# Patient Record
Sex: Male | Born: 1950 | Race: White | Hispanic: No | Marital: Single | Smoking: Former smoker
Health system: Southern US, Community
[De-identification: ages and names within clinical notes are randomized; demographics above are authoritative.]

## PROBLEM LIST (undated history)

## (undated) DIAGNOSIS — I4891 Unspecified atrial fibrillation: Secondary | ICD-10-CM

## (undated) DIAGNOSIS — E785 Hyperlipidemia, unspecified: Secondary | ICD-10-CM

## (undated) DIAGNOSIS — C189 Malignant neoplasm of colon, unspecified: Secondary | ICD-10-CM

## (undated) DIAGNOSIS — Q2543 Congenital aneurysm of aorta: Secondary | ICD-10-CM

## (undated) DIAGNOSIS — E119 Type 2 diabetes mellitus without complications: Secondary | ICD-10-CM

## (undated) DIAGNOSIS — I10 Essential (primary) hypertension: Secondary | ICD-10-CM

## (undated) DIAGNOSIS — I890 Lymphedema, not elsewhere classified: Secondary | ICD-10-CM

## (undated) DIAGNOSIS — L439 Lichen planus, unspecified: Secondary | ICD-10-CM

## (undated) DIAGNOSIS — H269 Unspecified cataract: Secondary | ICD-10-CM

## (undated) HISTORY — DX: Lichen planus, unspecified: L43.9

## (undated) HISTORY — DX: Essential (primary) hypertension: I10

## (undated) HISTORY — PX: FRACTURE SURGERY: SHX138

## (undated) HISTORY — PX: MULTIPLE TOOTH EXTRACTIONS: SHX2053

## (undated) HISTORY — DX: Unspecified cataract: H26.9

## (undated) HISTORY — DX: Type 2 diabetes mellitus without complications: E11.9

## (undated) HISTORY — PX: APPENDECTOMY: SHX54

## (undated) HISTORY — DX: Unspecified atrial fibrillation: I48.91

## (undated) HISTORY — DX: Hyperlipidemia, unspecified: E78.5

---

## 2013-02-23 ENCOUNTER — Ambulatory Visit: Payer: Self-pay | Admitting: Family Medicine

## 2013-02-23 ENCOUNTER — Ambulatory Visit: Payer: 59 | Admitting: Family Medicine

## 2013-02-23 VITALS — BP 138/78 | HR 84 | Temp 98.8°F | Resp 30 | Ht 72.5 in | Wt 320.0 lb

## 2013-02-23 DIAGNOSIS — E785 Hyperlipidemia, unspecified: Secondary | ICD-10-CM | POA: Insufficient documentation

## 2013-02-23 DIAGNOSIS — I480 Paroxysmal atrial fibrillation: Secondary | ICD-10-CM | POA: Insufficient documentation

## 2013-02-23 DIAGNOSIS — I10 Essential (primary) hypertension: Secondary | ICD-10-CM | POA: Insufficient documentation

## 2013-02-23 DIAGNOSIS — I4891 Unspecified atrial fibrillation: Secondary | ICD-10-CM

## 2013-02-23 DIAGNOSIS — E119 Type 2 diabetes mellitus without complications: Secondary | ICD-10-CM

## 2013-02-23 LAB — COMPREHENSIVE METABOLIC PANEL
ALT: 16 U/L (ref 0–53)
CO2: 28 mEq/L (ref 19–32)
Calcium: 9.1 mg/dL (ref 8.4–10.5)
Chloride: 99 mEq/L (ref 96–112)
Sodium: 136 mEq/L (ref 135–145)
Total Protein: 7 g/dL (ref 6.0–8.3)

## 2013-02-23 LAB — POCT CBC
Granulocyte percent: 72.3 %G (ref 37–80)
HCT, POC: 45.1 % (ref 43.5–53.7)
Hemoglobin: 14.2 g/dL (ref 14.1–18.1)
Lymph, poc: 2.1 (ref 0.6–3.4)
MCHC: 31.5 g/dL — AB (ref 31.8–35.4)
POC Granulocyte: 7.2 — AB (ref 2–6.9)

## 2013-02-23 LAB — LIPID PANEL: Cholesterol: 118 mg/dL (ref 0–200)

## 2013-02-23 LAB — POCT GLYCOSYLATED HEMOGLOBIN (HGB A1C): Hemoglobin A1C: 6.6

## 2013-02-23 MED ORDER — METFORMIN HCL 500 MG PO TABS: 1000.0000 mg | ORAL_TABLET | Freq: Two times a day (BID) | ORAL | Status: DC

## 2013-02-23 MED ORDER — METOPROLOL TARTRATE 50 MG PO TABS: 75.0000 mg | ORAL_TABLET | Freq: Two times a day (BID) | ORAL | Status: DC

## 2013-02-23 MED ORDER — LISINOPRIL 5 MG PO TABS: 5.0000 mg | ORAL_TABLET | Freq: Every day | ORAL | Status: DC

## 2013-02-23 MED ORDER — APIXABAN 5 MG PO TABS: 5.0000 mg | ORAL_TABLET | Freq: Two times a day (BID) | ORAL | Status: DC

## 2013-02-23 MED ORDER — GLIPIZIDE ER 5 MG PO TB24: 5.0000 mg | ORAL_TABLET | Freq: Every day | ORAL | Status: DC

## 2013-02-23 MED ORDER — ATORVASTATIN CALCIUM 20 MG PO TABS: 20.0000 mg | ORAL_TABLET | Freq: Every day | ORAL | Status: DC

## 2013-02-23 NOTE — Progress Notes (Signed)
[facility] [address], [place] [address] [phone]  Date:  02/23/2013   Name:  [name]   DOB:  04-16-1951   MRN:  [id number]  PCP:  No primary provider on file.    Chief Complaint: Medication Refill   History of Present Illness:  Tri Chittick is a 62 y.o. very pleasant male patient who presents with the following:  He is here as a new patient today.  He has an appt with Dr. Clelia Croft next month but needs his medications refilled prior to his appt.  He has returned to [place] after living in [place] for some time.  He has a long history of atrial fibrillation- he takes Eliquis for anticoagulation and metoprolol for rate control. Actually has been out of his Eliquis for a month or so.  He has seen a cardiologist in [place], but does not have a cardiologist yet in [facility] in [place] was Dr. Dayle Points- he is not sure of the name of the office.  He does have his medical records but did not bring them today.    He also has a history of DM on oral medications.   He is fasting today and would like to do labs.   He is also concerned about his feet and would like to see a podiatrist for toenail care Aware of his obesity and is trying to lose weight  There is no problem list on file for this patient.   Past Medical History  Diagnosis Date  . Atrial fibrillation   . Diabetes mellitus without complication   . Cataract   . Hyperlipidemia   . Hypertension     Past Surgical History  Procedure Laterality Date  . Appendectomy    . Multiple tooth extractions    . Fracture surgery      History  Substance Use Topics  . Smoking status: Never Smoker   . Smokeless tobacco: Not on file  . Alcohol Use: No    Family History  Problem Relation Age of Onset  . Brain cancer Mother   . Alzheimer's disease Father   . Cancer Maternal Grandmother   . Heart attack Maternal Grandfather   . Pneumonia Paternal Grandfather     No  Known Allergies  Medication list has been reviewed and updated.  No current outpatient prescriptions on file prior to visit.   No current facility-administered medications on file prior to visit.    Review of Systems:  As per HPI- otherwise negative.   Physical Examination: Filed Vitals:   02/23/13 1051  BP: 138/78  Pulse: 84  Temp: 98.8 F (37.1 C)  Resp: 30   Filed Vitals:   02/23/13 1051  Height: 6' 0.5" (1.842 m)  Weight: 320 lb (145.151 kg)   Body mass index is 42.78 kg/(m^2). Ideal Body Weight: Weight in (lb) to have BMI = 25: 186.5  GEN: WDWN, NAD, Non-toxic, A & O x 3, morbid obesity HEENT: Atraumatic, Normocephalic. Neck supple. No masses, No LAD. Ears and Nose: No external deformity. CV: RRR, No M/G/R. No JVD. No thrill. No extra heart sounds. PULM: CTA B, no wheezes, crackles, rhonchi. No retractions. No resp. distress. No accessory muscle use. He was not tachypnic during my exam ABD: S, NT, ND EXTR: No c/c/e NEURO Normal gait.  PSYCH: Normally interactive. Conversant. Not depressed or anxious appearing.  Calm demeanor.  Feet: overgrown thick nails but no skin breakdown  Results for orders placed in visit on  02/23/13  POCT CBC      Result Value Range   WBC 9.9  4.6 - 10.2 K/uL   Lymph, poc 2.1  0.6 - 3.4   POC LYMPH PERCENT 21.3  10 - 50 %L   MID (cbc) 0.6  0 - 0.9   POC MID % 6.4  0 - 12 %M   POC Granulocyte 7.2 (*) 2 - 6.9   Granulocyte percent 72.3  37 - 80 %G   RBC 4.49 (*) 4.69 - 6.13 M/uL   Hemoglobin 14.2  14.1 - 18.1 g/dL   HCT, POC 62.1  30.8 - 53.7 %   MCV 100.5 (*) 80 - 97 fL   MCH, POC 31.6 (*) 27 - 31.2 pg   MCHC 31.5 (*) 31.8 - 35.4 g/dL   RDW, POC 65.7     Platelet Count, POC 367  142 - 424 K/uL   MPV 8.5  0 - 99.8 fL  POCT GLYCOSYLATED HEMOGLOBIN (HGB A1C)      Result Value Range   Hemoglobin A1C 6.6      Assessment and Plan: Type II or unspecified type diabetes mellitus without mention of complication, not stated as  uncontrolled - Plan: POCT CBC, POCT glycosylated hemoglobin (Hb A1C), metFORMIN (GLUCOPHAGE) 500 MG tablet, glipiZIDE (GLUCOTROL XL) 5 MG 24 hr tablet, CANCELED: Ambulatory referral to Podiatry  Essential hypertension, benign - Plan: Comprehensive metabolic panel, Lipid panel, lisinopril (PRINIVIL,ZESTRIL) 5 MG tablet  Atrial fibrillation - Plan: metoprolol (LOPRESSOR) 50 MG tablet, lisinopril (PRINIVIL,ZESTRIL) 5 MG tablet, apixaban (ELIQUIS) 5 MG TABS tablet  Other and unspecified hyperlipidemia - Plan: Lipid panel, atorvastatin (LIPITOR) 20 MG tablet  Morbid obesity  DM is controlled, refilled his medications today May restart his eliquis today, will check CMP.  Rate is controlled Refilled all medications today Gave contact information for [facility]- he prefers to make his own appt  Will plan further follow- up pending labs.   Abbe Amsterdam, MD

## 2013-02-23 NOTE — Patient Instructions (Addendum)
I will be in touch with the rest of of your labs in a few days. We will see you for your appointment in April.  Let us know know if any problems in the meantime   Your A1c looks ok- this means your diabetes is under control  We are going to set you up with a foot doctor

## 2013-02-25 ENCOUNTER — Encounter: Payer: Self-pay | Admitting: Family Medicine

## 2013-03-16 ENCOUNTER — Encounter: Payer: Self-pay | Admitting: Family Medicine

## 2013-03-16 ENCOUNTER — Ambulatory Visit: Payer: 59 | Admitting: Family Medicine

## 2013-03-16 VITALS — BP 116/74 | HR 86 | Temp 97.6°F | Resp 18 | Ht 72.5 in | Wt 336.8 lb

## 2013-03-16 DIAGNOSIS — I10 Essential (primary) hypertension: Secondary | ICD-10-CM

## 2013-03-16 DIAGNOSIS — M1990 Unspecified osteoarthritis, unspecified site: Secondary | ICD-10-CM | POA: Insufficient documentation

## 2013-03-16 DIAGNOSIS — E669 Obesity, unspecified: Secondary | ICD-10-CM | POA: Insufficient documentation

## 2013-03-16 DIAGNOSIS — R0989 Other specified symptoms and signs involving the circulatory and respiratory systems: Secondary | ICD-10-CM

## 2013-03-16 DIAGNOSIS — E785 Hyperlipidemia, unspecified: Secondary | ICD-10-CM

## 2013-03-16 DIAGNOSIS — I4891 Unspecified atrial fibrillation: Secondary | ICD-10-CM

## 2013-03-16 DIAGNOSIS — R609 Edema, unspecified: Secondary | ICD-10-CM

## 2013-03-16 DIAGNOSIS — E119 Type 2 diabetes mellitus without complications: Secondary | ICD-10-CM | POA: Insufficient documentation
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
## 2013-03-16 NOTE — Patient Instructions (Addendum)
Diabetes and Exercise Diabetes mellitus is a common, chronic disease, in which the pancreas is unable to adequately control blood glucose (sugar) levels. There are 2 types of diabetes. Type 1 diabetes patients are unable to produce insulin, a hormone that causes sugar in the blood to be stored in the body. People with type 1 diabetes may compensate by giving themselves injections of insulin. Type 2 diabetes involves not producing adequate amounts of insulin to control blood glucose levels. People with type 2 diabetes control their blood glucose by monitoring their food intake or by taking medicine. Exercise is an important part of diabetes treatment. During exercise, the muscles use a greater amount of glucose from the blood for energy. This lowers your blood glucose, which is the same effect you would get from taking insulin. It has been shown that endurance athletes are more sensitive to insulin than inactive people. SYMPTOMS  Many people with a mild case of diabetes have no symptoms. However, if left uncontrolled, diabetes can lead to several complications that could be prevented with treatment of the disease. General symptoms of diabetes include:   Frequent urination (polyuria).  Frequent thirst and drinking (polydipsia).  Increased food consumption (polyphagia).  Fatigue.  Poor exercise performance.  Blurred vision.  Inflammation of the vagina (vaginitis) caused by fungal infections.  Skin infections (uncommon).  Numbness in the feet,caused by nerve injury.  Kidney disease. CAUSES  The cause of most cases of diabetes is unknown. In children, diabetes is often due to an autoimmune response to the cells in the pancreas that make insulin. It is also linked with other diseases, such as cystic fibrosis. Diabetes may have a genetic link. PREVENTION  Athletes should strive to begin exercise with blood glucose in a well-controlled state.  Feet should always be kept clean and  dry.  Activities in which low blood sugar levels cannot be treated easily (scuba diving, rock climbing, swimming) should be avoided.  Anticipate alterations in diet or training to avoid low blood sugar (hypoglycemia) and high blood sugar (hyperglycemia).  Athletes should try to increase sugar consumption after strenuous exercise to avoid hypoglycemia.  Short-acting insulin should not be injected into an actively exercising muscle. The athlete should rest the injection site for about 1 hour after exercise.  Patients with diabetes should get routine checkups of the feet to prevent complications. PROGNOSIS  Exercise provides many benefits to the person with diabetes:   Reduced body fat.  Lower blood pressure.  Often, reduced need for medicines.  Improved exercise tolerance.  Lower insulin levels.  Weight loss.  Improved lipid profile (decreased cholesterol and low-density lipoproteins). RELATED COMPLICATIONS  If performed incorrectly, exercise can result in complications of diabetes:   Poor control of blood sugar, when exercise is performed at the wrong time.  Increase in renal disease, from loss of body fluids (dehydration).  Increased risk of nerve injury (neuropathy) when performing exercises that increase foot injury.  Increased risk of eye problems when performingactivities that involve breath holding or lowering or jarring the head.  Increased risk of sudden death from exercise in patients with heart disease.  Worsening of hypertension with heavy lifting (more than 10 lb/4.5 kg). Altered blood glucose and insulin dose as a result of mild illness that produces loss of appetite.  Altered uptake of insulin after injection when insulin injection site is changed. NOTE: Exercise can lower blood glucose effectively, but the effects are short-lasting (no more than a couple of days). Exercise has been shown to improve your   sensitivity to insulin. This may alter how your body  responds to a given dose of injected insulin. It is important for every patient with diabetes to know how his or her body may react to exercise, and to adjust insulin dosages accordingly. TREATMENT  Eat about 1 to 3 hours before exercise.  Check blood glucose immediately before and after exercise.  Stop exercise if blood glucose is more than 250 mg/dL.  Stop exercise if blood glucose is less than 100 mg/dL.  Do not exercise within 1 hour of an insulin injection.  Be prepared to treat low blood glucose while exercising. Keep some sugar product with you, such as a candy bar.  For prolonged exercise, use a sports drink to maintain your glucose level.  Replace used up glucose in the body after exercise.  Consume fluids during and after exercise to avoid dehydration. SEEK MEDICAL CARE IF:  You have vision changes after a run.  You notice a loss of sensation in your feet after exercise.  You have increased numbness, tingling, or pins and needles sensations after exercise.  You have chest pain during or after exercise.  You have a fast, irregular heartbeat (palpitations) during or after exercise.  Your exercise tolerance gets worse.  You have fainting or dizzy spells for brief periods during or after exercise. Document Released: 11/29/2005 Document Revised: 02/21/2012 Document Reviewed: 03/13/2009 [facility] Patient Information 2013 [place], [place]. Exercise to Lose Weight Exercise and a healthy diet may help you lose weight. Your doctor may suggest specific exercises. EXERCISE IDEAS AND TIPS Choose low-cost things you enjoy doing, such as walking, bicycling, or exercising to workout videos. Take stairs instead of the elevator. Walk during your lunch break. Park your car further away from work or school. Go to a gym or an exercise class. Start with 5 to 10 minutes of exercise each day. Build up to 30 minutes of exercise 4 to 6 days a week. Wear shoes with good support and  comfortable clothes. Stretch before and after working out. Work out until you breathe harder and your heart beats faster. Drink extra water when you exercise. Do not do so much that you hurt yourself, feel dizzy, or get very short of breath. Exercises that burn about 150 calories: Running 1  miles in 15 minutes. Playing volleyball for 45 to 60 minutes. Washing and waxing a car for 45 to 60 minutes. Playing touch football for 45 minutes. Walking 1  miles in 35 minutes. Pushing a stroller 1  miles in 30 minutes. Playing basketball for 30 minutes. Raking leaves for 30 minutes. Bicycling 5 miles in 30 minutes. Walking 2 miles in 30 minutes. Dancing for 30 minutes. Shoveling snow for 15 minutes. Swimming laps for 20 minutes. Walking up stairs for 15 minutes. Bicycling 4 miles in 15 minutes. Gardening for 30 to 45 minutes. Jumping rope for 15 minutes. Washing windows or floors for 45 to 60 minutes. Document Released: 01/01/2011 Document Revised: 02/21/2012 Document Reviewed: 01/01/2011 [facility] Patient Information 2013 [place], [place]. Diabetes and Exercise Regular exercise is important and can help:   Control blood glucose (sugar).  Decrease blood pressure.    Control blood lipids (cholesterol, triglycerides).  Improve overall health. BENEFITS FROM EXERCISE  Improved fitness.  Improved flexibility.  Improved endurance.  Increased bone density.  Weight control.  Increased muscle strength.  Decreased body fat.  Improvement of the body's use of insulin, a hormone.  Increased insulin sensitivity.  Reduction of insulin needs.  Reduced stress and tension.  Helps you feel better. People with diabetes who add exercise to their lifestyle gain additional benefits, including:  Weight loss.  Reduced appetite.  Improvement of the body's use of blood glucose.  Decreased risk factors for heart disease:  Lowering of cholesterol and triglycerides.  Raising the  level of good cholesterol (high-density lipoproteins, HDL).  Lowering blood sugar.  Decreased blood pressure. TYPE 1 DIABETES AND EXERCISE  Exercise will usually lower your blood glucose.  If blood glucose is greater than 240 mg/dl, check urine ketones. If ketones are present, do not exercise.  Location of the insulin injection sites may need to be adjusted with exercise. Avoid injecting insulin into areas of the body that will be exercised. For example, avoid injecting insulin into:  The arms when playing tennis.  The legs when jogging. For more information, discuss this with your caregiver.  Keep a record of:  Food intake.  Type and amount of exercise.  Expected peak times of insulin action.  Blood glucose levels. Do this before, during, and after exercise. Review your records with your caregiver. This will help you to develop guidelines for adjusting food intake and insulin amounts.  TYPE 2 DIABETES AND EXERCISE  Regular physical activity can help control blood glucose.  Exercise is important because it may:  Increase the body's sensitivity to insulin.  Improve blood glucose control.  Exercise reduces the risk of heart disease. It decreases serum cholesterol and triglycerides. It also lowers blood pressure.  Those who take insulin or oral hypoglycemic agents should watch for signs of hypoglycemia. These signs include dizziness, shaking, sweating, chills, and confusion.  Body water is lost during exercise. It must be replaced. This will help to avoid loss of body fluids (dehydration) or heat stroke. Be sure to talk to your caregiver before starting an exercise program to make sure it is safe for you. Remember, any activity is better than none.  Document Released: 02/19/2004 Document Revised: 02/21/2012 Document Reviewed: 06/05/2009 [facility] Patient Information 2013 [place], [place].

## 2013-05-18 ENCOUNTER — Ambulatory Visit: Payer: 59 | Admitting: Family Medicine

## 2013-05-25 ENCOUNTER — Other Ambulatory Visit: Payer: Self-pay | Admitting: Family Medicine

## 2013-05-25 ENCOUNTER — Ambulatory Visit (INDEPENDENT_AMBULATORY_CARE_PROVIDER_SITE_OTHER): Payer: 59 | Admitting: Family Medicine

## 2013-05-25 ENCOUNTER — Encounter: Payer: Self-pay | Admitting: Family Medicine

## 2013-05-25 VITALS — BP 120/68 | HR 97 | Temp 98.7°F | Resp 18 | Ht 73.0 in | Wt 339.2 lb

## 2013-05-25 DIAGNOSIS — R21 Rash and other nonspecific skin eruption: Secondary | ICD-10-CM

## 2013-05-25 DIAGNOSIS — E119 Type 2 diabetes mellitus without complications: Secondary | ICD-10-CM

## 2013-05-25 LAB — COMPREHENSIVE METABOLIC PANEL
Albumin: 3.8 g/dL (ref 3.5–5.2)
CO2: 26 mEq/L (ref 19–32)
Calcium: 8.9 mg/dL (ref 8.4–10.5)
Glucose, Bld: 143 mg/dL — ABNORMAL HIGH (ref 70–99)
Potassium: 4.7 mEq/L (ref 3.5–5.3)
Sodium: 137 mEq/L (ref 135–145)
Total Protein: 6.6 g/dL (ref 6.0–8.3)

## 2013-05-25 LAB — POCT CBC
Granulocyte percent: 85.4 %G — AB (ref 37–80)
HCT, POC: 43 % — AB (ref 43.5–53.7)
MID (cbc): 0.3 (ref 0–0.9)
MPV: 8.3 fL (ref 0–99.8)
POC Granulocyte: 7 — AB (ref 2–6.9)
POC LYMPH PERCENT: 11 %L (ref 10–50)
Platelet Count, POC: 238 10*3/uL (ref 142–424)
RDW, POC: 14.4 %

## 2013-05-25 LAB — POCT URINALYSIS DIPSTICK
Glucose, UA: NEGATIVE
Ketones, UA: NEGATIVE
Protein, UA: 100
Spec Grav, UA: 1.025

## 2013-05-25 LAB — POCT UA - MICROSCOPIC ONLY
Bacteria, U Microscopic: NEGATIVE
Crystals, Ur, HPF, POC: NEGATIVE

## 2013-05-25 LAB — POCT GLYCOSYLATED HEMOGLOBIN (HGB A1C): Hemoglobin A1C: 7.1

## 2013-05-25 LAB — GLUCOSE, POCT (MANUAL RESULT ENTRY): POC Glucose: 169 mg/dl — AB (ref 70–99)

## 2013-05-25 MED ORDER — HYDROXYZINE HCL 25 MG PO TABS: 12.5000 mg | ORAL_TABLET | Freq: Three times a day (TID) | ORAL | Status: DC | PRN

## 2013-05-25 MED ORDER — LOSARTAN POTASSIUM 25 MG PO TABS: 25.0000 mg | ORAL_TABLET | Freq: Every day | ORAL | Status: DC

## 2013-05-25 MED ORDER — VALACYCLOVIR HCL 1 G PO TABS: 1000.0000 mg | ORAL_TABLET | Freq: Three times a day (TID) | ORAL | Status: DC

## 2013-05-25 NOTE — Progress Notes (Signed)
Subjective:    Patient ID: [name], male    DOB: 1951-11-19, 62 y.o.   MRN: [id number] Chief Complaint  Patient presents with  . Rash    rash on both hands and all over body    HPI  Drove to [place] 2 wks ago and when he came back 3d ago he started getting tingling in his hands and then yesterday he developed 7-8 spots across his palms and itchy bumps his hair on top of his head and along the back of neck and his left chest.  Things progressively worsened today. He thinks it is like shingles which he had before in the same location on the back of his neck and his scalp as the rash feels exactly the same way the shingles did.  It is itching, burning, and his palms of his hands and soles of his feet are painful to pressure today.  Is tolerable but it is spreading and seems to be actively worsening.  Has about 50 spots on his palms today.  Also can see new lesions on his chest. Has had chicken pox prior Hasn't tried anything on the rash.  Only really exploded in past 2d, no changes in products or foods, hasn't really left his appt. The only thing different is his [place] trip but he just stayed in hotel and drove around and went out to eat.  Didn't swim at all or go hiking, didn't lay in the sand.  Has also had some loose stools  Thinks the lisinopril is causing a dry tickle in his throat.  Past Medical History  Diagnosis Date  . Atrial fibrillation   . Diabetes mellitus without complication   . Cataract   . Hyperlipidemia   . Hypertension   . Lichen planus     bilateral legs   Current Outpatient Prescriptions on File Prior to Visit  Medication Sig Dispense Refill  . apixaban (ELIQUIS) 5 MG TABS tablet Take 1 tablet (5 mg total) by mouth 2 (two) times daily.  180 tablet  3  . atorvastatin (LIPITOR) 20 MG tablet Take 1 tablet (20 mg total) by mouth daily.  90 tablet  3  . cyanocobalamin 500 MCG tablet Take 500 mcg by mouth daily.      [name] glipiZIDE (GLUCOTROL XL) 5 MG 24 hr tablet Take 1  tablet (5 mg total) by mouth daily.  90 tablet  3  . metFORMIN (GLUCOPHAGE) 500 MG tablet Take 2 tablets (1,000 mg total) by mouth 2 (two) times daily with a meal.  360 tablet  3  . metoprolol (LOPRESSOR) 50 MG tablet Take 1.5 tablets (75 mg total) by mouth 2 (two) times daily.  270 tablet  3   No current facility-administered medications on file prior to visit.   No Known Allergies  Review of Systems  Constitutional: Negative for fever, chills, diaphoresis, activity change, appetite change, fatigue and unexpected weight change.  HENT: Negative for ear pain, congestion, sore throat, neck pain, neck stiffness and sinus pressure.   Respiratory: Positive for cough. Negative for shortness of breath.   Cardiovascular: Negative for chest pain.  Gastrointestinal: Positive for diarrhea. Negative for nausea, vomiting, abdominal pain and constipation.  Genitourinary: Negative for dysuria.  Musculoskeletal: Positive for arthralgias. Negative for myalgias, joint swelling and gait problem.  Skin: Negative for rash.  Neurological: Negative for syncope and headaches.  Psychiatric/Behavioral: Negative for sleep disturbance.      BP 120/68  Pulse 97  Temp(Src) 98.7 F (37.1 C) (Oral)  Resp 18  Ht 6\' 1"  (1.854 m)  Wt 339 lb 3.2 oz (153.86 kg)  BMI 44.76 kg/m2  SpO2 97% Objective:   Physical Exam  Constitutional: He is oriented to person, place, and time. He appears well-developed and well-nourished. No distress.  HENT:  Head: Normocephalic and atraumatic.  Eyes: No scleral icterus.  Pulmonary/Chest: Effort normal.  Neurological: He is alert and oriented to person, place, and time.  Skin: Skin is warm and dry. Rash noted. Rash is vesicular. He is not diaphoretic.  Numerous macular dark erythremic target lesions with few bullae over bilateral palms.  Over left chest and over entire posterior scalp are numerous isolated 3-35mm vesicles but hard roofed with dark yellow crust, none w/ fluid still  present visible.  Psychiatric: He has a normal mood and affect. His behavior is normal.      Results for orders placed in visit on 05/25/13  COMPREHENSIVE METABOLIC PANEL      Result Value Range   Sodium 137  135 - 145 mEq/L   Potassium 4.7  3.5 - 5.3 mEq/L   Chloride 99  96 - 112 mEq/L   CO2 26  19 - 32 mEq/L   Glucose, Bld 143 (*) 70 - 99 mg/dL   BUN 15  6 - 23 mg/dL   Creat 1.09  0.50 - 1.35 mg/dL   Total Bilirubin 1.1  0.3 - 1.2 mg/dL   Alkaline Phosphatase 87  39 - 117 U/L   AST 17  0 - 37 U/L   ALT 15  0 - 53 U/L   Total Protein 6.6  6.0 - 8.3 g/dL   Albumin 3.8  3.5 - 5.2 g/dL   Calcium 8.9  8.4 - 10.5 mg/dL  RPR      Result Value Range   RPR NON REAC  NON REAC  HSV(HERPES SIMPLEX VRS) I + II AB-IGM      Result Value Range   Herpes Simplex Vrs I&II-IgM Ab (EIA)      VARICELLA ZOSTER ANTIBODY, IGG      Result Value Range   Varicella IgG 3794.00 (*) <135.00 Index  POCT CBC      Result Value Range   WBC 8.2  4.6 - 10.2 K/uL   Lymph, poc 0.9  0.6 - 3.4   POC LYMPH PERCENT 11.0  10 - 50 %L   MID (cbc) 0.3  0 - 0.9   POC MID % 3.6  0 - 12 %M   POC Granulocyte 7.0 (*) 2 - 6.9   Granulocyte percent 85.4 (*) 37 - 80 %G   RBC 4.15 (*) 4.69 - 6.13 M/uL   Hemoglobin 13.5 (*) 14.1 - 18.1 g/dL   HCT, POC 43.0 (*) 43.5 - 53.7 %   MCV 103.6 (*) 80 - 97 fL   MCH, POC 32.5 (*) 27 - 31.2 pg   MCHC 31.4 (*) 31.8 - 35.4 g/dL   RDW, POC 14.4     Platelet Count, POC 238  142 - 424 K/uL   MPV 8.3  0 - 99.8 fL  GLUCOSE, POCT (MANUAL RESULT ENTRY)      Result Value Range   POC Glucose 169 (*) 70 - 99 mg/dl  POCT GLYCOSYLATED HEMOGLOBIN (HGB A1C)      Result Value Range   Hemoglobin A1C 7.1    POCT SEDIMENTATION RATE      Result Value Range   POCT SED RATE 39 (*) 0 - 22 mm/hr  POCT UA - MICROSCOPIC  ONLY      Result Value Range   WBC, Ur, HPF, POC 0-8     RBC, urine, microscopic 0-3     Bacteria, U Microscopic neg     Mucus, UA moderate     Epithelial cells, urine per micros  0-5     Crystals, Ur, HPF, POC neg     Casts, Ur, LPF, POC neg     Yeast, UA neg    POCT URINALYSIS DIPSTICK      Result Value Range   Color, UA yellow     Clarity, UA clear     Glucose, UA neg     Bilirubin, UA neg     Ketones, UA neg     Spec Grav, UA 1.025     Blood, UA neg     pH, UA 7.0     Protein, UA 100     Urobilinogen, UA 1.0     Nitrite, UA neg     Leukocytes, UA Negative      Assessment & Plan:  Diabetes - Plan: POCT glucose (manual entry), POCT glycosylated hemoglobin (Hb A1C) - will change from low dose lisinopril to losartan due to chronic dry cough.  Rash and nonspecific skin eruption - Plan: POCT CBC, POCT SEDIMENTATION RATE, POCT UA - Microscopic Only, POCT urinalysis dipstick, Comprehensive metabolic panel, RPR, Herpes simplex virus culture, HSV(herpes simplex vrs) 1+2 ab-IgM, Varicella zoster antibody, IgG - Pt is having an erythema multiforme reaction on his hands - likely to whatever rash he has over his chest and scalp, beard. Also developed erythema multiforme lesions on ankles while in clinic.  HSV culture pending and separate viral culture also sent but latter will take a mo to return. In meantime, will start trx w/ antiviral and have pt f/u closely w/in 48 hrs. If worsening, refer to derm and cons biopsy. Symptomatic treatment w/ cold compresses, aloe, vaseline, - do not use any cortisone.  Meds ordered this encounter  Medications  . valACYclovir (VALTREX) 1000 MG tablet    Sig: Take 1 tablet (1,000 mg total) by mouth 3 (three) times daily.    Dispense:  21 tablet    Refill:  0  . hydrOXYzine (ATARAX/VISTARIL) 25 MG tablet    Sig: Take 0.5-1 tablets (12.5-25 mg total) by mouth every 8 (eight) hours as needed for itching.    Dispense:  30 tablet    Refill:  0  . losartan (COZAAR) 25 MG tablet    Sig: Take 1 tablet (25 mg total) by mouth daily.    Dispense:  90 tablet    Refill:  1

## 2013-05-25 NOTE — Patient Instructions (Addendum)
Erythema Multiforme Erythema multiforme (EM) is a rash that occurs mostly on the skin. Sometimes it occurs on the lips and mouth. It is usually a mild illness that goes away on its own. It usually affects young adults in the spring and fall. It tends to be recurrent with each episode lasting 1 to 4 weeks. CAUSES  The cause of EM may be an overreaction by the body's immune system to a trigger (something that causes the body to react).  Common triggers include:  Infections, including:  Viruses.  Bacteria.  Fungi.  Parasites.  Medicines. Less common triggers include:  Foods.  Chemicals.  Injuries to the skin.  Pregnancy.  Other illnesses. In some cases the cause may not be known. SYMPTOMS  The rash from EM shows up suddenly. The rash may appear days after the trigger. It may start as small, red, round or oval marks that become bumps or raised welts over 24 to 48 hours. These can spread and be quite large (about one inch [several centimeters]). These skin changes usually appear first on the backs of the hands, then spread to the tops of the feet, arms, elbows, knees, palms and soles. There may be a mild rash on the lips and lining of the mouth. The skin rash may show up in waves over a few days. There may be mild itching or burning of the skin at first. It may take up to 4 weeks to go away. The rash may come back again at a later time. DIAGNOSIS  Diagnosis of EM is usually made by physical exam. Sometimes a skin biopsy is done if the diagnosis is not certain. A skin biopsy is the removal of a small piece of tissue which can be examined under a microscope by a specialist (pathologist). TREATMENT  Most episodes of EM heal on their own and treatment may not be needed. If possible, it is best to remove the trigger or treat the infection. If your trigger is a herpes virus infection (cold sore), use sunscreen lotion and sunscreen-containing lip balm to prevent sunlight triggered outbreaks of  herpes virus. Medicine for itching may be given. Medicines can be used for severe cases and to prevent repeat bouts of EM.  HOME CARE INSTRUCTIONS   If possible, avoid known triggers.  If a medicine was your trigger, be sure to notify all of your caregivers. You should avoid this medicine or any like it in the future. SEEK MEDICAL CARE IF:   Your EM rash shows up again in the future SEEK IMMEDIATE MEDICAL CARE IF:   Red, swollen lips or mouth develop.  Burning feeling in the mouth or lips.  Blisters or open sores in the mouth, lips, vagina, penis or anus.  Eye pain, redness or drainage.  Blisters on the skin.  Difficulty breathing.  Difficulty swallowing; drooling.  Blood in urine.  Pain with urinating. Document Released: 11/29/2005 Document Revised: 02/21/2012 Document Reviewed: 11/15/2008 Exit[employer] Patient Information 2014 [employer].  

## 2013-05-26 LAB — RPR

## 2013-05-27 ENCOUNTER — Ambulatory Visit (INDEPENDENT_AMBULATORY_CARE_PROVIDER_SITE_OTHER): Payer: 59 | Admitting: Internal Medicine

## 2013-05-27 VITALS — BP 132/71 | HR 123 | Temp 99.8°F | Resp 24 | Ht 73.0 in | Wt 341.0 lb

## 2013-05-27 DIAGNOSIS — N509 Disorder of male genital organs, unspecified: Secondary | ICD-10-CM

## 2013-05-27 DIAGNOSIS — N50819 Testicular pain, unspecified: Secondary | ICD-10-CM

## 2013-05-27 DIAGNOSIS — R21 Rash and other nonspecific skin eruption: Secondary | ICD-10-CM

## 2013-05-27 LAB — POCT URINALYSIS DIPSTICK
Blood, UA: NEGATIVE
Glucose, UA: 100
Nitrite, UA: NEGATIVE
Protein, UA: 30
Urobilinogen, UA: 0.2
pH, UA: 6

## 2013-05-27 LAB — POCT UA - MICROSCOPIC ONLY
Casts, Ur, LPF, POC: NEGATIVE
Crystals, Ur, HPF, POC: NEGATIVE
Mucus, UA: NEGATIVE
Yeast, UA: NEGATIVE

## 2013-05-27 NOTE — Progress Notes (Signed)
  Subjective:    Patient ID: [name], male    DOB: 06-08-1951, 62 y.o.   MRN: [id number]  HPIsee OV 2 days ago Dr Clelia Croft Rash on hands and feet much less painful Rash on legs extending up toward knees new lesions scalp and beard ??  Also sat on small toilet and crushed L testicle which is now painful w/ any movement or pressure No dysuria or frequency but feels a sense of urgency and suprapubic pressure HSV culture pending  Review of Systems Still no fever chills or night sweats    Objective:   Physical Exam Temperature 99.8 No acute distress Rash on hands and feet appears to be evolving into a deeper purple with less target lesions and more mature fasciculation though no scabs/still has frank bullae that are resolving as well Rash on legs appears to be spreading as found erythematous tiny papules but no vesiculation or scabbing Lesions on chest wall or clearing in scabbing   lesions on scalp and beard remain indeterminate  Left testicle is mildly swollen with tenderness in the body of the testicle as well as the lower pole epididymis Results for orders placed in visit on 05/27/13  POCT UA - MICROSCOPIC ONLY      Result Value Range   WBC, Ur, HPF, POC 0-1     RBC, urine, microscopic 0-1     Bacteria, U Microscopic TRACE     Mucus, UA NEG     Epithelial cells, urine per micros NEG     Crystals, Ur, HPF, POC NEG     Casts, Ur, LPF, POC NEG     Yeast, UA NEG    POCT URINALYSIS DIPSTICK      Result Value Range   Color, UA dk yellow     Clarity, UA clear     Glucose, UA 100     Bilirubin, UA NEG     Ketones, UA NEG     Spec Grav, UA 1.025     Blood, UA NEG     pH, UA 6.0     Protein, UA 30     Urobilinogen, UA 0.2     Nitrite, UA NEG     Leukocytes, UA Negative      Assessment & Plan:  Problem #1 this rash is improving symptomatically and maturing somewhat the appearance while patient is on Valtrex with HSV culture pending  We'll try to set up dermatology  evaluation as soon as possible to consider other etiologies  Problem #2 crush injury to testicle Ice, elevate, protect, tylenol

## 2013-05-28 LAB — HERPES SIMPLEX VIRUS CULTURE: Organism ID, Bacteria: NOT DETECTED

## 2013-06-08 ENCOUNTER — Ambulatory Visit: Payer: 59 | Admitting: Family Medicine

## 2013-08-14 ENCOUNTER — Other Ambulatory Visit: Payer: Self-pay | Admitting: Family Medicine

## 2013-08-15 ENCOUNTER — Ambulatory Visit: Payer: 59 | Admitting: Family Medicine

## 2013-08-15 VITALS — BP 118/70 | HR 78 | Temp 98.5°F | Resp 18 | Ht 73.0 in | Wt 352.0 lb

## 2013-08-15 DIAGNOSIS — R609 Edema, unspecified: Secondary | ICD-10-CM

## 2013-08-15 DIAGNOSIS — Z79899 Other long term (current) drug therapy: Secondary | ICD-10-CM

## 2013-08-15 DIAGNOSIS — M1990 Unspecified osteoarthritis, unspecified site: Secondary | ICD-10-CM

## 2013-08-15 DIAGNOSIS — E119 Type 2 diabetes mellitus without complications: Secondary | ICD-10-CM

## 2013-08-15 DIAGNOSIS — E1169 Type 2 diabetes mellitus with other specified complication: Secondary | ICD-10-CM

## 2013-08-15 DIAGNOSIS — L0390 Cellulitis, unspecified: Secondary | ICD-10-CM

## 2013-08-15 DIAGNOSIS — I4891 Unspecified atrial fibrillation: Secondary | ICD-10-CM

## 2013-08-15 DIAGNOSIS — L439 Lichen planus, unspecified: Secondary | ICD-10-CM

## 2013-08-15 DIAGNOSIS — M129 Arthropathy, unspecified: Secondary | ICD-10-CM

## 2013-08-15 DIAGNOSIS — L0291 Cutaneous abscess, unspecified: Secondary | ICD-10-CM

## 2013-08-15 LAB — GLUCOSE, POCT (MANUAL RESULT ENTRY): POC Glucose: 112 mg/dl — AB (ref 70–99)

## 2013-08-15 MED ORDER — SULFAMETHOXAZOLE-TRIMETHOPRIM 800-160 MG PO TABS: 2.0000 | ORAL_TABLET | Freq: Two times a day (BID) | ORAL | Status: DC

## 2013-08-15 MED ORDER — CEPHALEXIN 500 MG PO CAPS: 500.0000 mg | ORAL_CAPSULE | Freq: Four times a day (QID) | ORAL | Status: DC

## 2013-08-15 MED ORDER — POTASSIUM CHLORIDE CRYS ER 20 MEQ PO TBCR: 20.0000 meq | EXTENDED_RELEASE_TABLET | Freq: Every day | ORAL | Status: DC

## 2013-08-15 MED ORDER — FUROSEMIDE 40 MG PO TABS: 40.0000 mg | ORAL_TABLET | Freq: Every day | ORAL | Status: DC

## 2013-08-15 NOTE — Progress Notes (Signed)
Subjective:    Patient ID: [name], male    DOB: [birth year], 62 y.o.   MRN: [id number] Chief Complaint  Patient presents with  . open sores on legs    on going, with swelling   . handicap form   HPI  After about 50 ft gets burning muscular pain and shortness of breath intermittnetly. He has joined [employer] and going to [employer] working individually with a Psychologist, educational.  Some days he has to stop every 15 to 20 feet for shortness of breath and sometimes he can go around the whole track w/o a rest.  Saw a cardiologist in [place] last year - had an echo and told his heart was strong despite a. Fib. No CP.  Doesn't know why legs are so swollen but now become hot and red with some open sores so worried that are getting an infection.  Has been on vacations in [place] and [place] for the past 6 wks so just sitting in car or hotel room a lot and going out to eat. Leaving again on a [place] vacation at the end of the mo.  Past Medical History  Diagnosis Date  . Atrial fibrillation   . Diabetes mellitus without complication   . Cataract   . Hyperlipidemia   . Hypertension   . Lichen planus     bilateral legs   Current Outpatient Prescriptions on File Prior to Visit  Medication Sig Dispense Refill  . apixaban (ELIQUIS) 5 MG TABS tablet Take 1 tablet (5 mg total) by mouth 2 (two) times daily.  180 tablet  3  . atorvastatin (LIPITOR) 20 MG tablet Take 1 tablet (20 mg total) by mouth daily.  90 tablet  3  . cyanocobalamin 500 MCG tablet Take 500 mcg by mouth daily.      [name] glipiZIDE (GLUCOTROL XL) 5 MG 24 hr tablet Take 1 tablet (5 mg total) by mouth daily.  90 tablet  3  . losartan (COZAAR) 25 MG tablet Take 1 tablet (25 mg total) by mouth daily.  90 tablet  1  . metFORMIN (GLUCOPHAGE) 500 MG tablet Take 2 tablets (1,000 mg total) by mouth 2 (two) times daily with a meal.  360 tablet  3  . metoprolol (LOPRESSOR) 50 MG tablet Take 1.5 tablets (75 mg total) by mouth 2 (two) times daily.  270 tablet   3   No current facility-administered medications on file prior to visit.   No Known Allergies   Review of Systems  Constitutional: Negative for fever and chills.  Eyes: Negative for visual disturbance.  Respiratory: Positive for shortness of breath. Negative for apnea and wheezing.   Cardiovascular: Positive for leg swelling. Negative for chest pain.  Musculoskeletal: Positive for myalgias, back pain, joint swelling, arthralgias and gait problem.  Skin: Positive for color change, rash and wound.  Neurological: Positive for weakness. Negative for dizziness, syncope, facial asymmetry, light-headedness and headaches.  Psychiatric/Behavioral: Negative for dysphoric mood.       BP 118/70  Pulse 78  Temp(Src) 98.5 F (36.9 C) (Oral)  Resp 18  Ht 6\' 1"  (1.854 m)  Wt 352 lb (159.666 kg)  BMI 46.45 kg/m2  SpO2 98% Objective:   Physical Exam  Constitutional: He is oriented to person, place, and time. He appears well-developed and well-nourished. No distress.  HENT:  Head: Normocephalic and atraumatic.  Eyes: Conjunctivae are normal. Pupils are equal, round, and reactive to light. No scleral icterus.  Left ptosis  Neck: Normal range of  motion. Neck supple. No thyromegaly present.  Cardiovascular: Normal rate and normal heart sounds.  An irregularly irregular rhythm present.  2+ pitting edema bilaterally to knees  Pulmonary/Chest: Effort normal and breath sounds normal. No respiratory distress.  Musculoskeletal: He exhibits no edema.  Lymphadenopathy:    He has no cervical adenopathy.  Neurological: He is alert and oriented to person, place, and time.  Skin: Skin is warm and dry. He is not diaphoretic.  Bilateral lower ext with isolated 4-5 mm plaques with surround erythema and warmth, several superficial ulcers with serosanguinous drainage on anterior right shin.  Psychiatric: He has a normal mood and affect. His behavior is normal.      Assessment & Plan:  Atrial fibrillation -  Plan: Brain natriuretic peptide, Ambulatory referral to Cardiology - no acute concern but pt should establish with a cardiologist in case of future need. If lower ext edema continues, consider echocardiogram.  Cellulitis - Plan: Brain natriuretic peptide - cover with keflex and bactrim due to DM.  Lichen planus - Plan: Brain natriuretic peptide  Diabetes mellitus type 2 in obese - Plan: Brain natriuretic peptide, POCT glucose (manual entry) - under good control. Cont diet and exercise.  Arthritis - 6 mo temporary handicapped placard given due to inability to walk 200 feet w/o stopping to rest. If sxs persist in 6 months, ok to renew. Hopefully will improve with weight loss and exercise  Encounter for long-term (current) use of other medications - Plan: Comprehensive metabolic panel  Edema - if we could decrease this, it will likely help his cellulitis heal. Start lasix qd with K.  Meds ordered this encounter  Medications  . cephALEXin (KEFLEX) 500 MG capsule    Sig: Take 1 capsule (500 mg total) by mouth 4 (four) times daily.    Dispense:  40 capsule    Refill:  0  . sulfamethoxazole-trimethoprim (BACTRIM DS,SEPTRA DS) 800-160 MG per tablet    Sig: Take 2 tablets by mouth 2 (two) times daily.    Dispense:  40 tablet    Refill:  0  . furosemide (LASIX) 40 MG tablet    Sig: Take 1 tablet (40 mg total) by mouth daily.    Dispense:  30 tablet    Refill:  3  . potassium chloride SA (K-DUR,KLOR-CON) 20 MEQ tablet    Sig: Take 1 tablet (20 mEq total) by mouth daily.    Dispense:  30 tablet    Refill:  3

## 2013-08-15 NOTE — Patient Instructions (Signed)
If you continue to have intermittent shortness of breath, swelling in your legs, or any problems breathing when you lay flat, we will want to repeat the echocardiogram of your heart.

## 2013-08-16 LAB — COMPREHENSIVE METABOLIC PANEL
AST: 13 U/L (ref 0–37)
Albumin: 4 g/dL (ref 3.5–5.2)
BUN: 16 mg/dL (ref 6–23)
CO2: 26 mEq/L (ref 19–32)
Calcium: 8.8 mg/dL (ref 8.4–10.5)
Chloride: 102 mEq/L (ref 96–112)
Potassium: 4.3 mEq/L (ref 3.5–5.3)

## 2013-08-17 ENCOUNTER — Encounter: Payer: Self-pay | Admitting: Family Medicine

## 2013-08-17 LAB — BRAIN NATRIURETIC PEPTIDE: Brain Natriuretic Peptide: 94.3 pg/mL (ref 0.0–100.0)

## 2013-09-07 ENCOUNTER — Encounter: Payer: Self-pay | Admitting: Family Medicine

## 2013-09-07 ENCOUNTER — Ambulatory Visit (INDEPENDENT_AMBULATORY_CARE_PROVIDER_SITE_OTHER): Payer: 59 | Admitting: Family Medicine

## 2013-09-07 VITALS — BP 132/80 | HR 72 | Temp 98.0°F | Resp 16 | Ht 73.5 in | Wt 351.6 lb

## 2013-09-07 DIAGNOSIS — E1169 Type 2 diabetes mellitus with other specified complication: Secondary | ICD-10-CM

## 2013-09-07 DIAGNOSIS — I10 Essential (primary) hypertension: Secondary | ICD-10-CM

## 2013-09-07 DIAGNOSIS — Z79899 Other long term (current) drug therapy: Secondary | ICD-10-CM

## 2013-09-07 DIAGNOSIS — R609 Edema, unspecified: Secondary | ICD-10-CM

## 2013-09-07 DIAGNOSIS — I4891 Unspecified atrial fibrillation: Secondary | ICD-10-CM

## 2013-09-07 DIAGNOSIS — E119 Type 2 diabetes mellitus without complications: Secondary | ICD-10-CM

## 2013-09-07 DIAGNOSIS — E669 Obesity, unspecified: Secondary | ICD-10-CM

## 2013-09-07 LAB — BASIC METABOLIC PANEL
BUN: 14 mg/dL (ref 6–23)
Calcium: 9.4 mg/dL (ref 8.4–10.5)
Creat: 0.99 mg/dL (ref 0.50–1.35)
Glucose, Bld: 186 mg/dL — ABNORMAL HIGH (ref 70–99)
Sodium: 137 mEq/L (ref 135–145)

## 2013-09-07 MED ORDER — FUROSEMIDE 40 MG PO TABS: 40.0000 mg | ORAL_TABLET | Freq: Two times a day (BID) | ORAL | Status: DC

## 2013-09-07 MED ORDER — POTASSIUM CHLORIDE CRYS ER 20 MEQ PO TBCR: 20.0000 meq | EXTENDED_RELEASE_TABLET | Freq: Two times a day (BID) | ORAL | Status: DC

## 2013-09-07 NOTE — Progress Notes (Signed)
Subjective:    Patient ID: [name], male    DOB: 28-Mar-1951, 62 y.o.   MRN: [id number]  HPI Tried to bandage legs after our last visit and has always had a difficult time getting bandages to stick so tried some sort of [employer] self-gripping wrap and almost immed developed redness, swelling, spots, hives. Left the bandages in place for 24 hrs but the redness kept progressing up his legs so then took off. He was worried the weeping blisters might get further infected.  Blistering much better. The areas that I was concerned were infected look much better but he still has sores, redness, and excoriations from the allergic reaction to the bandages.  Is not painful or itching at all.  The swelling in his ankles has improved a little but his calves still feel very swollen.  Definitely notices himself urinating more since starting on the lasix.  Cbgs likely high today as he went to someone's 53 yr birthday party last night and had ice cream and cake and milkshakes.  Leaving town for a vacation in [place] for several weeks.  Has an appt with cardiology next wk to establish care and evaluate his a. fib. On bid metoprolol 75mg  for rate control but rate is still variable from 70s-130s just while in office today.  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 Has lost some weight and noticing that he can tolerate more exertion before becoming to winded.  Past Medical History  Diagnosis Date  . Atrial fibrillation   . Diabetes mellitus without complication   . Cataract   . Hyperlipidemia   . Hypertension   . Lichen planus     bilateral legs   Current Outpatient Prescriptions on File Prior to Visit  Medication Sig Dispense Refill  . apixaban (ELIQUIS) 5 MG TABS tablet Take 1 tablet (5 mg total) by mouth 2 (two) times daily.  180 tablet  3  . atorvastatin (LIPITOR) 20 MG tablet Take 1 tablet (20 mg total) by mouth daily.  90 tablet  3  . cyanocobalamin 500 MCG tablet Take 500 mcg  by mouth daily.      [name] glipiZIDE (GLUCOTROL XL) 5 MG 24 hr tablet Take 1 tablet (5 mg total) by mouth daily.  90 tablet  3  . losartan (COZAAR) 25 MG tablet Take 1 tablet (25 mg total) by mouth daily.  90 tablet  1  . metFORMIN (GLUCOPHAGE) 500 MG tablet Take 2 tablets (1,000 mg total) by mouth 2 (two) times daily with a meal.  360 tablet  3  . metoprolol (LOPRESSOR) 50 MG tablet Take 1.5 tablets (75 mg total) by mouth 2 (two) times daily.  270 tablet  3   No current facility-administered medications on file prior to visit.   No Known Allergies   Review of Systems  Constitutional: Negative for fever, chills, activity change, appetite change and unexpected weight change.  Respiratory: Positive for shortness of breath.   Cardiovascular: Positive for leg swelling. Negative for chest pain.  Endocrine: Positive for polyuria.  Genitourinary: Positive for frequency.  Musculoskeletal: Positive for joint swelling.  Skin: Positive for color change and rash. Negative for pallor.  Neurological: Negative for numbness.      BP 132/80  Pulse 72  Temp(Src) 98 F (36.7 C) (Oral)  Resp 16  Ht 6' 1.5" (1.867 m)  Wt 351 lb 9.6 oz (159.485 kg)  BMI 45.75 kg/m2  SpO2 98% Objective:   Physical Exam  Constitutional:  He is oriented to person, place, and time. He appears well-developed and well-nourished. No distress.  HENT:  Head: Normocephalic and atraumatic.  Eyes: Conjunctivae are normal. Pupils are equal, round, and reactive to light. No scleral icterus.  Neck: Normal range of motion. Neck supple. No thyromegaly present.  Cardiovascular: Normal rate, regular rhythm, normal heart sounds and intact distal pulses.   2+ pitting edema bilaterally to knees.  Pulmonary/Chest: Effort normal and breath sounds normal. No respiratory distress.  Musculoskeletal: He exhibits no edema.  Lymphadenopathy:    He has no cervical adenopathy.  Neurological: He is alert and oriented to person, place, and time.   Skin: Skin is warm and dry. He is not diaphoretic.  Bilateral legs with light violaceous plaques, few honey-crusted blisters, multiple excoriations and erythematous papules over lower legs.  Psychiatric: He has a normal mood and affect. His behavior is normal.      Results for orders placed in visit on 09/07/13  POCT GLYCOSYLATED HEMOGLOBIN (HGB A1C)      Result Value Range   Hemoglobin A1C 7.5     Assessment & Plan:  Atrial fibrillation - on bid metoprolol 75 and eliquis. Going to establish with cardiology  Morbid obesity - increasing exercise.  Essential hypertension, benign  Diabetes mellitus type 2 in obese - Plan: POCT glycosylated hemoglobin (Hb A1C), HM Diabetes Foot Exam - a1c increasing despite exercise. Pt going to work more on diet. If still up at f/u, increase glucotrol or consider augmenting w/ med to help weightloss like victoza or byetta.  Edema - Plan: Basic metabolic panel - increase lasix from 40 qd to 40 bid and always take K with lasix.  Encounter for long-term (current) use of other medications - Plan: Basic metabolic panel - recheck bmp since started lasix, will also need to repeat at /fu.  Meds ordered this encounter  Medications  . furosemide (LASIX) 40 MG tablet    Sig: Take 1 tablet (40 mg total) by mouth 2 (two) times daily.    Dispense:  180 tablet    Refill:  1  . potassium chloride SA (K-DUR,KLOR-CON) 20 MEQ tablet    Sig: Take 1 tablet (20 mEq total) by mouth 2 (two) times daily.    Dispense:  180 tablet    Refill:  1

## 2013-09-08 ENCOUNTER — Encounter: Payer: Self-pay | Admitting: Family Medicine

## 2013-09-13 ENCOUNTER — Ambulatory Visit (INDEPENDENT_AMBULATORY_CARE_PROVIDER_SITE_OTHER): Payer: Medicare PPO | Admitting: Internal Medicine

## 2013-09-13 ENCOUNTER — Encounter: Payer: Self-pay | Admitting: Internal Medicine

## 2013-09-13 VITALS — BP 110/70 | HR 101 | Ht 73.0 in | Wt 350.6 lb

## 2013-09-13 DIAGNOSIS — R0609 Other forms of dyspnea: Secondary | ICD-10-CM

## 2013-09-13 DIAGNOSIS — G4710 Hypersomnia, unspecified: Secondary | ICD-10-CM

## 2013-09-13 DIAGNOSIS — I10 Essential (primary) hypertension: Secondary | ICD-10-CM

## 2013-09-13 DIAGNOSIS — E785 Hyperlipidemia, unspecified: Secondary | ICD-10-CM

## 2013-09-13 DIAGNOSIS — I4891 Unspecified atrial fibrillation: Secondary | ICD-10-CM

## 2013-09-13 DIAGNOSIS — R0683 Snoring: Secondary | ICD-10-CM | POA: Insufficient documentation

## 2013-09-13 DIAGNOSIS — I4510 Unspecified right bundle-branch block: Secondary | ICD-10-CM

## 2013-09-13 NOTE — Patient Instructions (Addendum)
Your physician has recommended that you have a sleep study. This test records several body functions during sleep, including: brain activity, eye movement, oxygen and carbon dioxide blood levels, heart rate and rhythm, breathing rate and rhythm, the flow of air through your mouth and nose, snoring, body muscle movements, and chest and belly movement.  Your physician wants you to follow-up in: 6 months. You will receive a reminder letter in the mail two months in advance. If you don't receive a letter, please call our office to schedule the follow-up appointment.  

## 2013-09-13 NOTE — Progress Notes (Signed)
OFFICE NOTE  Chief Complaint:  Establish cardiology care  Primary Care Physician: Norberto Sorenson, MD  HPI:  [name] is a pleasant 62 year old gentleman who has moved from [place] to this area and wishes to establish care with a new cardiologist. He was seen by Dr. Clelia Croft and urgent medical care and kindly referred to our practice. Unfortunately we do not have records at this time to review, but he does say that he has some records at home that we could make copies of. We will also attempt to get records from his cardiologist in [place]. Per Mr. Strebeck his report, he has a history of atrial fibrillation which he says he's had for years. He always knew he had palpitations but prior to a cataract surgery he was told he was in atrial fibrillation and was formally referred to a cardiologist. He was started on [place] do a number of risk factors and a high CHADS2VASC score. He is also has morbid obesity, hypertension, dyslipidemia, diabetes type 2 and a distant family history of heart disease.  He reports he had an echocardiogram at his cardiologist's office and was told that his heart is "unusually strong" for atrial fibrillation. He denies any chest pain, but does get short of breath with exertion. In addition he reports loud snoring at night and periods of awakening due to gasping for breath. He has always thought that he may have sleep apnea but has not been formally tested for that.  Finally, he has been struggling with lower study swelling and has a diagnosis of lichen planus, but seems to develop blisters which slough and has some open sores particularly of the left medial lower leg. He was placed recently on furosemide to reduce his lower extremity swelling.  PMHx:  Past Medical History  Diagnosis Date  . Atrial fibrillation   . Diabetes mellitus without complication   . Cataract   . Hyperlipidemia   . Hypertension   . Lichen planus     bilateral legs    Past Surgical History    Procedure Laterality Date  . Appendectomy    . Multiple tooth extractions    . Fracture surgery      FAMHx:  Family History  Problem Relation Age of Onset  . Brain cancer Mother   . Alzheimer's disease Father   . Cancer Maternal Grandmother   . Heart attack Maternal Grandfather   . Pneumonia Paternal Grandfather     SOCHx:   reports that he quit smoking about 30 years ago. His smoking use included Pipe. He has never used smokeless tobacco. He reports that he does not drink alcohol or use illicit drugs.  ALLERGIES:  Allergies  Allergen Reactions  . Other     [name] bandage (self-gripping) - erythema, itching    ROS: A comprehensive review of systems was negative except for: Constitutional: positive for fatigue Ears, nose, mouth, throat, and face: positive for snoring Respiratory: positive for dyspnea on exertion Integument/breast: positive for skin color change and skin lesion(s)  HOME MEDS: Current Outpatient Prescriptions  Medication Sig Dispense Refill  . apixaban (ELIQUIS) 5 MG TABS tablet Take 1 tablet (5 mg total) by mouth 2 (two) times daily.  180 tablet  3  . atorvastatin (LIPITOR) 20 MG tablet Take 1 tablet (20 mg total) by mouth daily.  90 tablet  3  . cyanocobalamin 500 MCG tablet Take 500 mcg by mouth daily.      . furosemide (LASIX) 40 MG tablet Take 1  tablet (40 mg total) by mouth 2 (two) times daily.  180 tablet  1  . glipiZIDE (GLUCOTROL XL) 5 MG 24 hr tablet Take 1 tablet (5 mg total) by mouth daily.  90 tablet  3  . losartan (COZAAR) 25 MG tablet Take 1 tablet (25 mg total) by mouth daily.  90 tablet  1  . metFORMIN (GLUCOPHAGE) 500 MG tablet Take 2 tablets (1,000 mg total) by mouth 2 (two) times daily with a meal.  360 tablet  3  . metoprolol (LOPRESSOR) 50 MG tablet Take 1.5 tablets (75 mg total) by mouth 2 (two) times daily.  270 tablet  3  . Multiple Vitamin (MULTIVITAMIN) capsule Take 1 capsule by mouth daily.      . Omega 3 1200 MG CAPS  Take 2 capsules by mouth daily.      . potassium chloride SA (K-DUR,KLOR-CON) 20 MEQ tablet Take 1 tablet (20 mEq total) by mouth 2 (two) times daily.  180 tablet  1  . ranitidine (ZANTAC) 150 MG capsule Take 150 mg by mouth as needed for heartburn.       No current facility-administered medications for this visit.    LABS/IMAGING: No results found for this or any previous visit (from the past 48 hour(s)). No results found.  VITALS: BP 110/70  Pulse 101  Ht 6\' 1"  (1.854 m)  Wt 350 lb 9.6 oz (159.031 kg)  BMI 46.27 kg/m2  EXAM: General appearance: alert, no distress and morbidly obese Neck: no adenopathy, no carotid bruit, supple, symmetrical, trachea midline, thyroid not enlarged, symmetric, no tenderness/mass/nodules and could not assess JVP Lungs: diminished breath sounds bilaterally Heart: regular rate and rhythm Abdomen: Protuberant, nontender, morbidly obese Extremities: Edema bilaterally, raised pink papules and excoriation marks, open sore on the left medial lower leg Pulses: 2+ and symmetric Skin: Pale, warm and dry Neurologic: Alert and oriented X 3, normal strength and tone. Normal symmetric reflexes. Normal coordination and gait Psych: Somewhat pressured speech, disorganized thought, pleasant  EKG: Atrial fibrillation at 101, right bundle branch block, PVCs  ASSESSMENT: 1. Atrial fibrillation with generally controlled ventricular response, right bundle branch block 2. Anticoagulation with Eliquis 3. Dyslipidemia-on statin 4. Hypertension-controlled 5. Morbid obesity-he is enrolled in Weight Watchers 6. Lower extremity edema-now on Lasix 7. 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 Diabetes type 2-on oral anti-hyperglycemics  PLAN: 1.   Mr. Enck has a number of cardiovasular problems and is a complex patient with risk for cardiovascular disease. He is in atrial fibrillation with a generally well controlled ventricular response on  metoprolol. He is anticoagulated on a Eliquis. He is working on weight loss per his report and is on oral anti-hypoglycemics for his diabetes. We'll need to recheck a lipid profile. In addition I'll review his recent echocardiogram to see if he requires any further imaging studies. Finally, it does sound as if he may have obstructive sleep apnea. He certainly is at high risk with symptoms including snoring, waking gasping for breath, and sleeping in a supine position. I would recommend a split night sleep study and titration of CPAP if indicated. I will review his records will plan to see him back in 6 months or sooner as necessary.  Thank you for referring him to establish cardiac care.  Chrystie Nose, MD, [facility] Attending Cardiologist [facility]  Rafay Dahan C 09/13/2013, 11:24 AM

## 2013-09-17 ENCOUNTER — Encounter: Payer: Self-pay | Admitting: Internal Medicine

## 2013-10-12 ENCOUNTER — Ambulatory Visit (INDEPENDENT_AMBULATORY_CARE_PROVIDER_SITE_OTHER): Payer: 59 | Admitting: Family Medicine

## 2013-10-12 ENCOUNTER — Encounter: Payer: Self-pay | Admitting: Family Medicine

## 2013-10-12 VITALS — BP 130/70 | HR 68 | Temp 98.3°F | Resp 16 | Ht 73.0 in | Wt 346.2 lb

## 2013-10-12 DIAGNOSIS — R0683 Snoring: Secondary | ICD-10-CM

## 2013-10-12 DIAGNOSIS — E785 Hyperlipidemia, unspecified: Secondary | ICD-10-CM

## 2013-10-12 DIAGNOSIS — E119 Type 2 diabetes mellitus without complications: Secondary | ICD-10-CM

## 2013-10-12 DIAGNOSIS — R609 Edema, unspecified: Secondary | ICD-10-CM

## 2013-10-12 DIAGNOSIS — E1169 Type 2 diabetes mellitus with other specified complication: Secondary | ICD-10-CM

## 2013-10-12 DIAGNOSIS — I10 Essential (primary) hypertension: Secondary | ICD-10-CM

## 2013-10-12 DIAGNOSIS — I4891 Unspecified atrial fibrillation: Secondary | ICD-10-CM

## 2013-10-12 DIAGNOSIS — Z79899 Other long term (current) drug therapy: Secondary | ICD-10-CM

## 2013-10-12 LAB — LIPID PANEL
Cholesterol: 151 mg/dL (ref 0–200)
HDL: 52 mg/dL (ref >39)
LDL Cholesterol: 72 mg/dL (ref 0–99)
Total CHOL/HDL Ratio: 2.9 Ratio
Triglycerides: 133 mg/dL (ref <150)

## 2013-10-12 LAB — BASIC METABOLIC PANEL
BUN: 19 mg/dL (ref 6–23)
Calcium: 8.8 mg/dL (ref 8.4–10.5)
Creat: 1.09 mg/dL (ref 0.50–1.35)

## 2013-10-12 MED ORDER — APIXABAN 5 MG PO TABS: 5.0000 mg | ORAL_TABLET | Freq: Two times a day (BID) | ORAL | Status: DC

## 2013-10-12 MED ORDER — METFORMIN HCL 1000 MG PO TABS: 1000.0000 mg | ORAL_TABLET | Freq: Two times a day (BID) | ORAL | Status: DC

## 2013-10-12 MED ORDER — LOSARTAN POTASSIUM 25 MG PO TABS: 25.0000 mg | ORAL_TABLET | Freq: Every day | ORAL | Status: DC

## 2013-10-12 NOTE — Progress Notes (Signed)
Subjective:    Patient ID: [name], male    DOB: [birth year], 62 y.o.   MRN: [id number] Chief Complaint  Patient presents with  . Hypertension  . Diabetes  . recheck both legs    HPI Going back to [place] in 2 wks on 11/12 for a ptosis procedure - had a cataract procedure followed by laser procedure for resulting cloudiness.  Recently was visiting in [place] for 3 wks and went to about 43 birthday parties so sugars have been worse.  Swelling in legs is getting much better and rash is healing well - almost getting to baseline.  Willing to under sleep study but reluctant  Past Medical History  Diagnosis Date  . Atrial fibrillation   . Diabetes mellitus without complication   . Cataract   . Hyperlipidemia   . Hypertension   . Lichen planus     bilateral legs   Current Outpatient Prescriptions on File Prior to Visit  Medication Sig Dispense Refill  . atorvastatin (LIPITOR) 20 MG tablet Take 1 tablet (20 mg total) by mouth daily.  90 tablet  3  . cyanocobalamin 500 MCG tablet Take 500 mcg by mouth daily.      . furosemide (LASIX) 40 MG tablet Take 1 tablet (40 mg total) by mouth 2 (two) times daily.  180 tablet  1  . glipiZIDE (GLUCOTROL XL) 5 MG 24 hr tablet Take 1 tablet (5 mg total) by mouth daily.  90 tablet  3  . metoprolol (LOPRESSOR) 50 MG tablet Take 1.5 tablets (75 mg total) by mouth 2 (two) times daily.  270 tablet  3  . Multiple Vitamin (MULTIVITAMIN) capsule Take 1 capsule by mouth daily.      . Omega 3 1200 MG CAPS Take 2 capsules by mouth daily.      . potassium chloride SA (K-DUR,KLOR-CON) 20 MEQ tablet Take 1 tablet (20 mEq total) by mouth 2 (two) times daily.  180 tablet  1  . ranitidine (ZANTAC) 150 MG capsule Take 150 mg by mouth as needed for heartburn.       No current facility-administered medications on file prior to visit.   Allergies  Allergen Reactions  . Other     [name] bandage (self-gripping) - erythema, itching     Review of Systems   Constitutional: Positive for fatigue. Negative for fever, chills, activity change and appetite change.  Eyes: Negative for visual disturbance.  Respiratory: Positive for shortness of breath.   Cardiovascular: Positive for leg swelling. Negative for chest pain.  Skin: Positive for color change, rash and wound.  Neurological: Positive for weakness. Negative for dizziness, syncope, facial asymmetry, light-headedness and headaches.  Psychiatric/Behavioral: Positive for sleep disturbance.      BP 130/70  Pulse 68  Temp(Src) 98.3 F (36.8 C) (Oral)  Resp 16  Ht 6\' 1"  (1.854 m)  Wt 346 lb 3.2 oz (157.035 kg)  BMI 45.69 kg/m2  SpO2 91% Objective:   Physical Exam  Constitutional: He is oriented to person, place, and time. He appears well-developed and well-nourished. No distress.  HENT:  Head: Normocephalic and atraumatic.  Eyes: Conjunctivae are normal. Pupils are equal, round, and reactive to light. No scleral icterus.  Neck: Normal range of motion. Neck supple. No thyromegaly present.  Cardiovascular: Normal rate, regular rhythm, normal heart sounds and intact distal pulses.   Pulmonary/Chest: Effort normal and breath sounds normal. No respiratory distress.  Musculoskeletal: He exhibits edema.  Lymphadenopathy:    He has no cervical  adenopathy.  Neurological: He is alert and oriented to person, place, and time.  Skin: Skin is warm and dry. Rash noted. Rash is maculopapular and vesicular. He is not diaphoretic.  Psychiatric: He has a normal mood and affect. His behavior is normal.          Assessment & Plan:   Type II or unspecified type diabetes mellitus without mention of complication, not stated as uncontrolled - Plan: metFORMIN (GLUCOPHAGE) 1000 MG tablet, TSH  Atrial fibrillation - Plan: apixaban (ELIQUIS) 5 MG TABS tablet  Morbid obesity - Plan: Ambulatory referral to Sleep Studies - pt encouraged to get test for OSA as will help w/ sleep, energy, weight loss,  etc  Other and unspecified hyperlipidemia - Plan: Lipid panel  Essential hypertension, benign - Plan: Ambulatory referral to Sleep Studies  Edema  Diabetes mellitus type 2 in obese - Plan: Basic metabolic panel  Snoring - Plan: Ambulatory referral to Sleep Studies  Encounter for long-term (current) use of other medications - Plan: Basic metabolic panel  Meds ordered this encounter  Medications  . OVER THE COUNTER MEDICATION    Sig: OTC Calcium w/ Vitamin D 3 taking  . OVER THE COUNTER MEDICATION    Sig: OTC Magnesium 400 mg taking  . metFORMIN (GLUCOPHAGE) 1000 MG tablet    Sig: Take 1 tablet (1,000 mg total) by mouth 2 (two) times daily with a meal.    Dispense:  180 tablet    Refill:  3  . losartan (COZAAR) 25 MG tablet    Sig: Take 1 tablet (25 mg total) by mouth daily.    Dispense:  90 tablet    Refill:  1  . apixaban (ELIQUIS) 5 MG TABS tablet    Sig: Take 1 tablet (5 mg total) by mouth 2 (two) times daily.    Dispense:  180 tablet    Refill:  3   Norberto Sorenson, MD MPH

## 2013-10-15 ENCOUNTER — Encounter: Payer: Self-pay | Admitting: Family Medicine

## 2013-11-06 ENCOUNTER — Institutional Professional Consult (permissible substitution): Payer: 59 | Admitting: Neurology

## 2013-11-13 ENCOUNTER — Telehealth: Payer: Self-pay | Admitting: *Deleted

## 2013-11-13 NOTE — Telephone Encounter (Signed)
Faxed to [facility] signed Medical Clearance Form & office visit note - hold eliquis 3 days prior to surgery & restart after.

## 2013-12-21 ENCOUNTER — Ambulatory Visit (INDEPENDENT_AMBULATORY_CARE_PROVIDER_SITE_OTHER): Payer: 59 | Admitting: Family Medicine

## 2013-12-21 VITALS — BP 123/70 | HR 56 | Temp 98.0°F | Resp 18 | Ht 73.0 in | Wt 343.0 lb

## 2013-12-21 DIAGNOSIS — R609 Edema, unspecified: Secondary | ICD-10-CM

## 2013-12-21 DIAGNOSIS — E119 Type 2 diabetes mellitus without complications: Secondary | ICD-10-CM

## 2013-12-21 DIAGNOSIS — N529 Male erectile dysfunction, unspecified: Secondary | ICD-10-CM

## 2013-12-21 DIAGNOSIS — E669 Obesity, unspecified: Secondary | ICD-10-CM

## 2013-12-21 DIAGNOSIS — Z79899 Other long term (current) drug therapy: Secondary | ICD-10-CM

## 2013-12-21 DIAGNOSIS — E785 Hyperlipidemia, unspecified: Secondary | ICD-10-CM

## 2013-12-21 DIAGNOSIS — I4891 Unspecified atrial fibrillation: Secondary | ICD-10-CM

## 2013-12-21 DIAGNOSIS — I10 Essential (primary) hypertension: Secondary | ICD-10-CM

## 2013-12-21 DIAGNOSIS — M1990 Unspecified osteoarthritis, unspecified site: Secondary | ICD-10-CM

## 2013-12-21 DIAGNOSIS — E1169 Type 2 diabetes mellitus with other specified complication: Secondary | ICD-10-CM

## 2013-12-21 DIAGNOSIS — E1165 Type 2 diabetes mellitus with hyperglycemia: Secondary | ICD-10-CM

## 2013-12-21 DIAGNOSIS — IMO0001 Reserved for inherently not codable concepts without codable children: Secondary | ICD-10-CM

## 2013-12-21 LAB — MICROALBUMIN / CREATININE URINE RATIO
Creatinine, Urine: 254 mg/dL
MICROALB UR: 5.26 mg/dL — AB (ref 0.00–1.89)
Microalb Creat Ratio: 20.7 mg/g (ref 0.0–30.0)

## 2013-12-21 LAB — COMPREHENSIVE METABOLIC PANEL
ALT: 15 U/L (ref 0–53)
AST: 17 U/L (ref 0–37)
Albumin: 3.8 g/dL (ref 3.5–5.2)
Alkaline Phosphatase: 81 U/L (ref 39–117)
BILIRUBIN TOTAL: 0.6 mg/dL (ref 0.3–1.2)
BUN: 13 mg/dL (ref 6–23)
CO2: 27 mEq/L (ref 19–32)
CREATININE: 1.04 mg/dL (ref 0.50–1.35)
Calcium: 8.9 mg/dL (ref 8.4–10.5)
Chloride: 99 mEq/L (ref 96–112)
Glucose, Bld: 209 mg/dL — ABNORMAL HIGH (ref 70–99)
Potassium: 4.3 mEq/L (ref 3.5–5.3)
Sodium: 138 mEq/L (ref 135–145)
Total Protein: 6.9 g/dL (ref 6.0–8.3)

## 2013-12-21 LAB — POCT GLYCOSYLATED HEMOGLOBIN (HGB A1C): HEMOGLOBIN A1C: 8.6

## 2013-12-21 MED ORDER — SILDENAFIL CITRATE 100 MG PO TABS
50.0000 mg | ORAL_TABLET | Freq: Every day | ORAL | Status: DC | PRN
Start: 2013-12-21 — End: 2016-11-08

## 2013-12-21 NOTE — Progress Notes (Signed)
Subjective:    Patient ID: [name], male    DOB: Oct 14, 1951, 64 y.o.   MRN: [id number] Chief Complaint  Patient presents with  . 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  Has lost all of cartilage in knees and gets worse in cold weather.  His weight is aggravating it.  Looking into weight watchers as still overeating - can't do it on own.  Had eyelid surgery in right about 2 wks ago - might need a little more taken out after swelling goes down so has f/u on 1/22 and might do repeat surg on 1/23.  Still getting on treadmill and meeting w/ trainer.     Swelling in legs getting much better and wounds healing.  Very embarrased about poor condition of feet.  Is dating several women.  Thinks he may be interested in started a sexual relationship but has erectile dysfunction. States his desire is more than ever - he thinks about it sev times daily and pleasures himself regularly. Has never tried any of the phosphodiesterase inhibitors - has been reassured by past cardiologist that this would be safe as no h/o CAD, has never needed nitro.  Past Medical History  Diagnosis Date  . Atrial fibrillation   . Diabetes mellitus without complication   . Cataract   . Hyperlipidemia   . Hypertension   . Lichen planus     bilateral legs   Current Outpatient Prescriptions on File Prior to Visit  Medication Sig Dispense Refill  . apixaban (ELIQUIS) 5 MG TABS tablet Take 1 tablet (5 mg total) by mouth 2 (two) times daily.  180 tablet  3  . atorvastatin (LIPITOR) 20 MG tablet Take 1 tablet (20 mg total) by mouth daily.  90 tablet  3  . cyanocobalamin 500 MCG tablet Take 500 mcg by mouth daily.      . furosemide (LASIX) 40 MG tablet Take 1 tablet (40 mg total) by mouth 2 (two) times daily.  180 tablet  1  . glipiZIDE (GLUCOTROL XL) 5 MG 24 hr tablet Take 1 tablet (5 mg total) by mouth daily.  90 tablet  3  . losartan (COZAAR) 25 MG tablet Take 1  tablet (25 mg total) by mouth daily.  90 tablet  1  . metFORMIN (GLUCOPHAGE) 1000 MG tablet Take 1 tablet (1,000 mg total) by mouth 2 (two) times daily with a meal.  180 tablet  3  . metoprolol (LOPRESSOR) 50 MG tablet Take 1.5 tablets (75 mg total) by mouth 2 (two) times daily.  270 tablet  3  . Multiple Vitamin (MULTIVITAMIN) capsule Take 1 capsule by mouth daily.      . Omega 3 1200 MG CAPS Take 2 capsules by mouth daily.      [name] OVER THE COUNTER MEDICATION OTC Calcium w/ Vitamin D 3 taking      . OVER THE COUNTER MEDICATION OTC Magnesium 400 mg taking      . potassium chloride SA (K-DUR,KLOR-CON) 20 MEQ tablet Take 1 tablet (20 mEq total) by mouth 2 (two) times daily.  180 tablet  1  . ranitidine (ZANTAC) 150 MG capsule Take 150 mg by mouth as needed for heartburn.       No current facility-administered medications on file prior to visit.   Allergies  Allergen Reactions  . Other     [name] bandage (self-gripping) - erythema, itching  Review of Systems  Constitutional: Positive for fatigue. Negative for fever, chills, diaphoresis, activity change, appetite change and unexpected weight change.  Eyes: Negative for redness and visual disturbance.  Respiratory: Negative for shortness of breath.   Cardiovascular: Positive for leg swelling. Negative for chest pain.  Musculoskeletal: Positive for arthralgias, gait problem and joint swelling.  Skin: Positive for rash and wound.  Neurological: Positive for weakness. Negative for dizziness, syncope, facial asymmetry, light-headedness, numbness and headaches.  Psychiatric/Behavioral: Negative for sleep disturbance.       BP 123/70  Pulse 56  Temp(Src) 98 F (36.7 C)  Resp 18  Ht 6\' 1"  (1.854 m)  Wt 343 lb (155.584 kg)  BMI 45.26 kg/m2  SpO2 95% Objective:   Physical Exam  Constitutional: He is oriented to person, place, and time. He appears well-developed and well-nourished. No distress.  HENT:  Head: Normocephalic and  atraumatic.  Eyes: Conjunctivae are normal. Pupils are equal, round, and reactive to light. No scleral icterus.  Neck: Normal range of motion. Neck supple. No thyromegaly present.  Cardiovascular: Normal rate, regular rhythm, normal heart sounds and intact distal pulses.   Pulmonary/Chest: Effort normal and breath sounds normal. No respiratory distress.  Musculoskeletal: He exhibits edema.  Lymphadenopathy:    He has no cervical adenopathy.  Neurological: He is alert and oriented to person, place, and time.  Skin: Skin is warm and dry. Ecchymosis, lesion and rash noted. Rash is maculopapular and vesicular. He is not diaphoretic.  Lower extremities w/ bilateral lichen planus Nails hypertrophic, thickened, feet w/ severe fungus and calluses  Psychiatric: He has a normal mood and affect. His behavior is normal.      Results for orders placed in visit on 12/21/13  COMPREHENSIVE METABOLIC PANEL      Result Value Range   Sodium 138  135 - 145 mEq/L   Potassium 4.3  3.5 - 5.3 mEq/L   Chloride 99  96 - 112 mEq/L   CO2 27  19 - 32 mEq/L   Glucose, Bld 209 (*) 70 - 99 mg/dL   BUN 13  6 - 23 mg/dL   Creat 1.04  0.50 - 1.35 mg/dL   Total Bilirubin 0.6  0.3 - 1.2 mg/dL   Alkaline Phosphatase 81  39 - 117 U/L   AST 17  0 - 37 U/L   ALT 15  0 - 53 U/L   Total Protein 6.9  6.0 - 8.3 g/dL   Albumin 3.8  3.5 - 5.2 g/dL   Calcium 8.9  8.4 - 10.5 mg/dL  MICROALBUMIN / CREATININE URINE RATIO      Result Value Range   Microalb, Ur 5.26 (*) 0.00 - 1.89 mg/dL   Creatinine, Urine 254.0     Microalb Creat Ratio 20.7  0.0 - 30.0 mg/g  POCT GLYCOSYLATED HEMOGLOBIN (HGB A1C)      Result Value Range   Hemoglobin A1C 8.6      Assessment & Plan:  Type II or unspecified type diabetes mellitus without mention of complication, not stated as uncontrolled - Plan: HM Diabetes Foot Exam, POCT glycosylated hemoglobin (Hb A1C), Microalbumin/Creatinine Ratio, Urine - hgba1c worsening - discussed w/ pt starting new  medication - consider adding in byetta or victoza to help w/ weight loss - however, pt really adament that he is going to cont exercising and going to work very hard on diet (joining [employer]) so will work hard on tlc - if not sig improvement at f/u will then cons starting  med pt agrees.  Morbid obesity - start weight watchers.  Pt cancelled sleep studies referral. Rec reschedule at his convenience.  Edema  Atrial fibrillation  Arthritis - permanent handicap DMV form completed.  Encounter for long-term (current) use of other medications - Plan: Comprehensive metabolic panel  ED (erectile dysfunction) of organic origin  Meds ordered this encounter  Medications  . sildenafil (VIAGRA) 100 MG tablet    Sig: Take 0.5-1 tablets (50-100 mg total) by mouth daily as needed for erectile dysfunction.    Dispense:  5 tablet    Refill:  2    I personally performed the services described in this documentation, which was scribed in my presence. The recorded information has been reviewed and considered, and addended by me as needed.  Delman Cheadle, MD MPH

## 2013-12-25 ENCOUNTER — Encounter: Payer: Self-pay | Admitting: Family Medicine

## 2014-03-08 ENCOUNTER — Other Ambulatory Visit: Payer: Self-pay | Admitting: Family Medicine

## 2014-04-26 ENCOUNTER — Ambulatory Visit (INDEPENDENT_AMBULATORY_CARE_PROVIDER_SITE_OTHER): Payer: 59 | Admitting: Family Medicine

## 2014-04-26 ENCOUNTER — Encounter: Payer: Self-pay | Admitting: Family Medicine

## 2014-04-26 VITALS — BP 120/90 | HR 81 | Temp 98.1°F | Resp 16 | Ht 73.0 in | Wt 332.6 lb

## 2014-04-26 DIAGNOSIS — E785 Hyperlipidemia, unspecified: Secondary | ICD-10-CM

## 2014-04-26 DIAGNOSIS — I10 Essential (primary) hypertension: Secondary | ICD-10-CM

## 2014-04-26 DIAGNOSIS — E119 Type 2 diabetes mellitus without complications: Secondary | ICD-10-CM

## 2014-04-26 DIAGNOSIS — E1169 Type 2 diabetes mellitus with other specified complication: Secondary | ICD-10-CM

## 2014-04-26 DIAGNOSIS — E669 Obesity, unspecified: Secondary | ICD-10-CM

## 2014-04-26 DIAGNOSIS — R609 Edema, unspecified: Secondary | ICD-10-CM

## 2014-04-26 DIAGNOSIS — I4891 Unspecified atrial fibrillation: Secondary | ICD-10-CM

## 2014-04-26 DIAGNOSIS — Z79899 Other long term (current) drug therapy: Secondary | ICD-10-CM

## 2014-04-26 LAB — COMPREHENSIVE METABOLIC PANEL
ALK PHOS: 80 U/L (ref 39–117)
ALT: 13 U/L (ref 0–53)
AST: 16 U/L (ref 0–37)
Albumin: 4 g/dL (ref 3.5–5.2)
BUN: 17 mg/dL (ref 6–23)
CALCIUM: 9.3 mg/dL (ref 8.4–10.5)
CO2: 27 mEq/L (ref 19–32)
Chloride: 98 mEq/L (ref 96–112)
Creat: 1.08 mg/dL (ref 0.50–1.35)
Glucose, Bld: 207 mg/dL — ABNORMAL HIGH (ref 70–99)
Potassium: 4.7 mEq/L (ref 3.5–5.3)
Sodium: 135 mEq/L (ref 135–145)
Total Bilirubin: 0.6 mg/dL (ref 0.2–1.2)
Total Protein: 6.9 g/dL (ref 6.0–8.3)

## 2014-04-26 LAB — LIPID PANEL
Cholesterol: 134 mg/dL (ref 0–200)
HDL: 51 mg/dL (ref >39)
LDL CALC: 61 mg/dL (ref 0–99)
Total CHOL/HDL Ratio: 2.6 Ratio
Triglycerides: 110 mg/dL (ref <150)
VLDL: 22 mg/dL (ref 0–40)

## 2014-04-26 LAB — POCT GLYCOSYLATED HEMOGLOBIN (HGB A1C): HEMOGLOBIN A1C: 9.9

## 2014-04-26 LAB — GLUCOSE, POCT (MANUAL RESULT ENTRY): POC Glucose: 232 mg/dl — AB (ref 70–99)

## 2014-04-26 MED ORDER — FUROSEMIDE 40 MG PO TABS: 40.0000 mg | ORAL_TABLET | Freq: Two times a day (BID) | ORAL | Status: DC

## 2014-04-26 MED ORDER — METOPROLOL TARTRATE 50 MG PO TABS: ORAL_TABLET | ORAL | Status: DC

## 2014-04-26 MED ORDER — LOSARTAN POTASSIUM 25 MG PO TABS: 25.0000 mg | ORAL_TABLET | Freq: Every day | ORAL | Status: DC

## 2014-04-26 MED ORDER — APIXABAN 5 MG PO TABS: 5.0000 mg | ORAL_TABLET | Freq: Two times a day (BID) | ORAL | Status: DC

## 2014-04-26 MED ORDER — GLIPIZIDE ER 10 MG PO TB24: 10.0000 mg | ORAL_TABLET | Freq: Every day | ORAL | Status: DC

## 2014-04-26 MED ORDER — POTASSIUM CHLORIDE CRYS ER 20 MEQ PO TBCR: 20.0000 meq | EXTENDED_RELEASE_TABLET | Freq: Two times a day (BID) | ORAL | Status: DC

## 2014-04-26 NOTE — Progress Notes (Signed)
Subjective:    Patient ID: [name], male    DOB: 1951/02/28, 63 y.o.   MRN: [id number] Chief Complaint  Patient presents with  . Diabetes  . Medication Refill    Eliquis, Atorvastatin, Glipizide,Losartan    HPI This chart was scribed for Shawnee Knapp, MD by [name], ED Scribe. The patient was seen in room 24. Patient's care was started at 8:10 AM.  HPI Comments: [name] is a 63 y.o. male, with a h/o NIDDM, who presents to the [facility] for follow. His last hemoglobin A1C readings of 8.6. At his last visit, pt was dedicated to diet and exercise, begin Weight Watchers, and see a personal trainer at his gym. Discussed if hemoglobin A1C has not gone down, we would consider adding byetta or victoza. Pt has lost 11 lbs since last visit.  Pt has not yet joined weight watchers due to financial reasons. He reports smaller portion sizes and cutting back on eating fast foods over the past 3 months. Pt states that his sessions with his personal trainer has ended but he reports going to the gym once a week and walking more. He reports being able to walk longer without stopping.  He reports SOB and chest pressure when it is humid outside. He describes this as "being under water". Pt has never had lung testing. He denies wheezing. Pt has not tried an inhaler.  Pt has a h/o chronic inflammation. He reports bilateral lower leg swelling with blisters. He went to a dermatologist a few months ago who suggested applying cream to the area for itching relief. Pt states that the rash is not itchy.  Pt also reports morning blood sugar readings between 140-150. He reports 150-180 readings after eating. He also reports occasional 200 readings due to eating late at night. He denies low sugar readings. Pt has not eaten today. He is currently fasting.   Pt has been out of his fish oil for several weeks but plans to restart.  Past Medical History  Diagnosis Date  . Atrial  fibrillation   . Diabetes mellitus without complication   . Cataract   . Hyperlipidemia   . Hypertension   . Lichen planus     bilateral legs   Current Outpatient Prescriptions on File Prior to Visit  Medication Sig Dispense Refill  . apixaban (ELIQUIS) 5 MG TABS tablet Take 1 tablet (5 mg total) by mouth 2 (two) times daily.  180 tablet  3  . atorvastatin (LIPITOR) 20 MG tablet Take 1 tablet (20 mg total) by mouth daily.  90 tablet  3  . cyanocobalamin 500 MCG tablet Take 500 mcg by mouth daily.      . furosemide (LASIX) 40 MG tablet Take 1 tablet (40 mg total) by mouth 2 (two) times daily.  180 tablet  1  . glipiZIDE (GLUCOTROL XL) 5 MG 24 hr tablet Take 1 tablet (5 mg total) by mouth daily.  90 tablet  3  . losartan (COZAAR) 25 MG tablet Take 1 tablet (25 mg total) by mouth daily.  90 tablet  1  . metFORMIN (GLUCOPHAGE) 1000 MG tablet Take 1 tablet (1,000 mg total) by mouth 2 (two) times daily with a meal.  180 tablet  3  . metoprolol (LOPRESSOR) 50 MG tablet TAKE 1 AND 1/2 TABLETS BY MOUTH TWICE DAILY  270 tablet  0  . Multiple Vitamin (MULTIVITAMIN) capsule Take 1 capsule by mouth daily.      [name]  Omega 3 1200 MG CAPS Take 2 capsules by mouth daily.      [name] OVER THE COUNTER MEDICATION OTC Calcium w/ Vitamin D 3 taking      . OVER THE COUNTER MEDICATION OTC Magnesium 400 mg taking      . potassium chloride SA (K-DUR,KLOR-CON) 20 MEQ tablet Take 1 tablet (20 mEq total) by mouth 2 (two) times daily.  180 tablet  1  . ranitidine (ZANTAC) 150 MG capsule Take 150 mg by mouth as needed for heartburn.      . sildenafil (VIAGRA) 100 MG tablet Take 0.5-1 tablets (50-100 mg total) by mouth daily as needed for erectile dysfunction.  5 tablet  2   No current facility-administered medications on file prior to visit.   Allergies  Allergen Reactions  . Other     [name] bandage (self-gripping) - erythema, itching    Review of Systems  Constitutional: Negative for activity change and  appetite change.  Respiratory: Positive for chest tightness and shortness of breath. Negative for wheezing.   Cardiovascular: Positive for leg swelling. Negative for chest pain.  Genitourinary:       Not currently sexually active   Musculoskeletal: Positive for arthralgias.  Skin: Positive for color change, rash and wound.  Neurological: Negative for numbness.     BP 120/90  Pulse 81  Temp(Src) 98.1 F (36.7 C) (Oral)  Resp 16  Ht 6\' 1"  (1.854 m)  Wt 332 lb 9.6 oz (150.866 kg)  BMI 43.89 kg/m2  SpO2 95% Objective:   Physical Exam  Nursing note and vitals reviewed. Constitutional: He is oriented to person, place, and time. He appears well-developed and well-nourished. No distress.  HENT:  Head: Normocephalic and atraumatic.  Eyes: Conjunctivae are normal. Pupils are equal, round, and reactive to light. No scleral icterus.  Neck: Normal range of motion. Neck supple. No thyromegaly present.  Cardiovascular: Normal rate, normal heart sounds and intact distal pulses.  An irregular rhythm present.  Pulmonary/Chest: Effort normal and breath sounds normal. No respiratory distress.  Lungs are clear   Musculoskeletal: He exhibits edema.  Lymphadenopathy:    He has no cervical adenopathy.  Neurological: He is alert and oriented to person, place, and time.  Skin: Skin is warm and dry. He is not diaphoretic.  2+ pitting edema with violaceous erythematous scaling rash with some small areas of escar   Psychiatric: He has a normal mood and affect. His behavior is normal.    Results for orders placed in visit on 04/26/14  GLUCOSE, POCT (MANUAL RESULT ENTRY)      Result Value Ref Range   POC Glucose 232 (*) 70 - 99 mg/dl  POCT GLYCOSYLATED HEMOGLOBIN (HGB A1C)      Result Value Ref Range   Hemoglobin A1C 9.9         Assessment & Plan:     Type II or unspecified type diabetes mellitus without mention of complication, not stated as uncontrolled - Plan: POCT glucose (manual entry),  POCT glycosylated hemoglobin (Hb A1C), glipiZIDE (GLUCOTROL XL) 10 MG 24 hr tablet.  Will increase his Glucotrol from 5 to 10 and consider byetta or victoza at next visit prn.  Other and unspecified hyperlipidemia - Plan: Lipid panel - Will refill Lipitor after lipid panel returns.   Morbid obesity  Essential hypertension, benign  Edema  Diabetes mellitus type 2 in obese  Atrial fibrillation - Plan: apixaban (ELIQUIS) 5 MG TABS tablet  Encounter for long-term (current) use of other  medications - Plan: Comprehensive metabolic panel  Meds ordered this encounter  Medications  . potassium chloride SA (K-DUR,KLOR-CON) 20 MEQ tablet    Sig: Take 1 tablet (20 mEq total) by mouth 2 (two) times daily.    Dispense:  180 tablet    Refill:  1  . metoprolol (LOPRESSOR) 50 MG tablet    Sig: TAKE 1 AND 1/2 TABLETS BY MOUTH TWICE DAILY    Dispense:  270 tablet    Refill:  1  . losartan (COZAAR) 25 MG tablet    Sig: Take 1 tablet (25 mg total) by mouth daily.    Dispense:  90 tablet    Refill:  1  . glipiZIDE (GLUCOTROL XL) 10 MG 24 hr tablet    Sig: Take 1 tablet (10 mg total) by mouth daily.    Dispense:  90 tablet    Refill:  3  . furosemide (LASIX) 40 MG tablet    Sig: Take 1 tablet (40 mg total) by mouth 2 (two) times daily.    Dispense:  180 tablet    Refill:  1  . apixaban (ELIQUIS) 5 MG TABS tablet    Sig: Take 1 tablet (5 mg total) by mouth 2 (two) times daily.    Dispense:  180 tablet    Refill:  3    I personally performed the services described in this documentation, which was scribed in my presence. The recorded information has been reviewed and considered, and addended by me as needed.  Delman Cheadle, MD MPH

## 2014-04-26 NOTE — Patient Instructions (Signed)
Diabetes and Exercise Exercising regularly is important. It is not just about losing weight. It has many health benefits, such as:  Improving your overall fitness, flexibility, and endurance.  Increasing your bone density.  Helping with weight control.  Decreasing your body fat.  Increasing your muscle strength.  Reducing stress and tension.  Improving your overall health. People with diabetes who exercise gain additional benefits because exercise:  Reduces appetite.  Improves the body's use of blood sugar (glucose).  Helps lower or control blood glucose.  Decreases blood pressure.  Helps control blood lipids (such as cholesterol and triglycerides).  Improves the body's use of the hormone insulin by:  Increasing the body's insulin sensitivity.  Reducing the body's insulin needs.  Decreases the risk for heart disease because exercising:  Lowers cholesterol and triglycerides levels.  Increases the levels of good cholesterol (such as high-density lipoproteins [HDL]) in the body.  Lowers blood glucose levels. YOUR ACTIVITY PLAN  Choose an activity that you enjoy and set realistic goals. Your health care provider or diabetes educator can help you make an activity plan that works for you. You can break activities into 2 or 3 sessions throughout the day. Doing so is as good as one long session. Exercise ideas include:  Taking the dog for a walk.  Taking the stairs instead of the elevator.  Dancing to your favorite song.  Doing your favorite exercise with a friend. RECOMMENDATIONS FOR EXERCISING WITH TYPE 1 OR TYPE 2 DIABETES   Check your blood glucose before exercising. If blood glucose levels are greater than 240 mg/dL, check for urine ketones. Do not exercise if ketones are present.  Avoid injecting insulin into areas of the body that are going to be exercised. For example, avoid injecting insulin into:  The arms when playing tennis.  The legs when  jogging.  Keep a record of:  Food intake before and after you exercise.  Expected peak times of insulin action.  Blood glucose levels before and after you exercise.  The type and amount of exercise you have done.  Review your records with your health care provider. Your health care provider will help you to develop guidelines for adjusting food intake and insulin amounts before and after exercising.  If you take insulin or oral hypoglycemic agents, watch for signs and symptoms of hypoglycemia. They include:  Dizziness.  Shaking.  Sweating.  Chills.  Confusion.  Drink plenty of water while you exercise to prevent dehydration or heat stroke. Body water is lost during exercise and must be replaced.  Talk to your health care provider before starting an exercise program to make sure it is safe for you. Remember, almost any type of activity is better than none. Document Released: 02/19/2004 Document Revised: 08/01/2013 Document Reviewed: 05/08/2013 [facility] Patient Information 2014 [place]. Diabetes and Foot Care Diabetes may cause you to have problems because of poor blood supply (circulation) to your feet and legs. This may cause the skin on your feet to become thinner, break easier, and heal more slowly. Your skin may become dry, and the skin may peel and crack. You may also have nerve damage in your legs and feet causing decreased feeling in them. You may not notice minor injuries to your feet that could lead to infections or more serious problems. Taking care of your feet is one of the most important things you can do for yourself.  HOME CARE INSTRUCTIONS  Wear shoes at all times, even in the house. Do not go  barefoot. Bare feet are easily injured.  Check your feet daily for blisters, cuts, and redness. If you cannot see the bottom of your feet, use a mirror or ask someone for help.  Wash your feet with warm water (do not use hot water) and mild soap. Then pat your feet  and the areas between your toes until they are completely dry. Do not soak your feet as this can dry your skin.  Apply a moisturizing lotion or petroleum jelly (that does not contain alcohol and is unscented) to the skin on your feet and to dry, brittle toenails. Do not apply lotion between your toes.  Trim your toenails straight across. Do not dig under them or around the cuticle. File the edges of your nails with an emery board or nail file.  Do not cut corns or calluses or try to remove them with medicine.  Wear clean socks or stockings every day. Make sure they are not too tight. Do not wear knee-high stockings since they may decrease blood flow to your legs.  Wear shoes that fit properly and have enough cushioning. To break in new shoes, wear them for just a few hours a day. This prevents you from injuring your feet. Always look in your shoes before you put them on to be sure there are no objects inside.  Do not cross your legs. This may decrease the blood flow to your feet.  If you find a minor scrape, cut, or break in the skin on your feet, keep it and the skin around it clean and dry. These areas may be cleansed with mild soap and water. Do not cleanse the area with peroxide, alcohol, or iodine.  When you remove an adhesive bandage, be sure not to damage the skin around it.  If you have a wound, look at it several times a day to make sure it is healing.  Do not use heating pads or hot water bottles. They may burn your skin. If you have lost feeling in your feet or legs, you may not know it is happening until it is too late.  Make sure your health care provider performs a complete foot exam at least annually or more often if you have foot problems. Report any cuts, sores, or bruises to your health care provider immediately. SEEK MEDICAL CARE IF:   You have an injury that is not healing.  You have cuts or breaks in the skin.  You have an ingrown nail.  You notice redness on your  legs or feet.  You feel burning or tingling in your legs or feet.  You have pain or cramps in your legs and feet.  Your legs or feet are numb.  Your feet always feel cold. SEEK IMMEDIATE MEDICAL CARE IF:   There is increasing redness, swelling, or pain in or around a wound.  There is a red line that goes up your leg.  Pus is coming from a wound.  You develop a fever or as directed by your health care provider.  You notice a bad smell coming from an ulcer or wound. Document Released: 11/26/2000 Document Revised: 08/01/2013 Document Reviewed: 05/08/2013 [facility] Patient Information 2014 [place]. Diabetes and Standards of Medical Care  Diabetes is complicated. You may find that your diabetes team includes a dietitian, nurse, diabetes educator, eye doctor, and more. To help everyone know what is going on and to help you get the care you deserve, the following schedule of care was developed to  help keep you on track. Below are the tests, exams, vaccines, medicines, education, and plans you will need. HbA1c test This test shows how well you have controlled your glucose over the past 2 3 months. It is used to see if your diabetes management plan needs to be adjusted.   It is performed at least 2 times a year if you are meeting treatment goals.  It is performed 4 times a year if therapy has changed or if you are not meeting treatment goals. Blood pressure test  This test is performed at every routine medical visit. The goal is less than 140/90 mmHg for most people, but 130/80 mmHg in some cases. Ask your health care provider about your goal. Dental exam  Follow up with the dentist regularly. Eye exam  If you are diagnosed with type 1 diabetes as a child, get an exam upon reaching the age of 80 years or older and have had diabetes for 3 5 years. Yearly eye exams are recommended after that initial eye exam.  If you are diagnosed with type 1 diabetes as an adult, get an exam  within 5 years of diagnosis and then yearly.  If you are diagnosed with type 2 diabetes, get an exam as soon as possible after the diagnosis and then yearly. Foot care exam  Visual foot exams are performed at every routine medical visit. The exams check for cuts, injuries, or other problems with the feet.  A comprehensive foot exam should be done yearly. This includes visual inspection as well as assessing foot pulses and testing for loss of sensation.  Check your feet nightly for cuts, injuries, or other problems with your feet. Tell your health care provider if anything is not healing. Kidney function test (urine microalbumin)  This test is performed once a year.  Type 1 diabetes: The first test is performed 5 years after diagnosis.  Type 2 diabetes: The first test is performed at the time of diagnosis.  A serum creatinine and estimated glomerular filtration rate (eGFR) test is done once a year to assess the level of chronic kidney disease (CKD), if present. Lipid profile (cholesterol, HDL, LDL, triglycerides)  Performed every 5 years for most people.  The goal for LDL is less than 100 mg/dL. If you are at high risk, the goal is less than 70 mg/dL.  The goal for HDL is 40 mg/dL 50 mg/dL for men and 50 mg/dL 60 mg/dL for women. An HDL cholesterol of 60 mg/dL or higher gives some protection against heart disease.  The goal for triglycerides is less than 150 mg/dL. Influenza vaccine, pneumococcal vaccine, and hepatitis B vaccine  The influenza vaccine is recommended yearly.  The pneumococcal vaccine is generally given once in a lifetime. However, there are some instances when another vaccination is recommended. Check with your health care provider.  The hepatitis B vaccine is also recommended for adults with diabetes. Diabetes self-management education  Education is recommended at diagnosis and ongoing as needed. Treatment plan  Your treatment plan is reviewed at every medical  visit. Document Released: 09/26/2009 Document Revised: 08/01/2013 Document Reviewed: 05/01/2013 [facility] Patient Information 2014 [place].

## 2014-05-23 ENCOUNTER — Other Ambulatory Visit: Payer: Self-pay | Admitting: Family Medicine

## 2014-07-26 ENCOUNTER — Ambulatory Visit: Payer: 59 | Admitting: Family Medicine

## 2014-08-02 ENCOUNTER — Ambulatory Visit (INDEPENDENT_AMBULATORY_CARE_PROVIDER_SITE_OTHER): Payer: 59 | Admitting: Family Medicine

## 2014-08-02 ENCOUNTER — Encounter: Payer: Self-pay | Admitting: Family Medicine

## 2014-08-02 VITALS — BP 122/70 | HR 78 | Temp 98.5°F | Resp 16 | Ht 73.5 in | Wt 347.5 lb

## 2014-08-02 DIAGNOSIS — E785 Hyperlipidemia, unspecified: Secondary | ICD-10-CM

## 2014-08-02 DIAGNOSIS — D539 Nutritional anemia, unspecified: Secondary | ICD-10-CM

## 2014-08-02 DIAGNOSIS — E119 Type 2 diabetes mellitus without complications: Secondary | ICD-10-CM

## 2014-08-02 DIAGNOSIS — Z79899 Other long term (current) drug therapy: Secondary | ICD-10-CM

## 2014-08-02 DIAGNOSIS — I872 Venous insufficiency (chronic) (peripheral): Secondary | ICD-10-CM

## 2014-08-02 DIAGNOSIS — I8310 Varicose veins of unspecified lower extremity with inflammation: Secondary | ICD-10-CM

## 2014-08-02 DIAGNOSIS — D649 Anemia, unspecified: Secondary | ICD-10-CM

## 2014-08-02 LAB — POCT CBC
Granulocyte percent: 79.8 %G (ref 37–80)
HEMATOCRIT: 42.6 % — AB (ref 43.5–53.7)
Hemoglobin: 13.8 g/dL — AB (ref 14.1–18.1)
LYMPH, POC: 1.4 (ref 0.6–3.4)
MCH, POC: 32.2 pg — AB (ref 27–31.2)
MCHC: 32.4 g/dL (ref 31.8–35.4)
MCV: 99.3 fL — AB (ref 80–97)
MID (cbc): 0.5 (ref 0–0.9)
MPV: 7.6 fL (ref 0–99.8)
POC GRANULOCYTE: 7.7 — AB (ref 2–6.9)
POC LYMPH PERCENT: 14.7 %L (ref 10–50)
POC MID %: 5.5 % (ref 0–12)
Platelet Count, POC: 259 10*3/uL (ref 142–424)
RBC: 4.29 M/uL — AB (ref 4.69–6.13)
RDW, POC: 14.4 %
WBC: 9.7 10*3/uL (ref 4.6–10.2)

## 2014-08-02 LAB — POCT GLYCOSYLATED HEMOGLOBIN (HGB A1C): Hemoglobin A1C: 9.1

## 2014-08-02 MED ORDER — PRAVASTATIN SODIUM 40 MG PO TABS: 40.0000 mg | ORAL_TABLET | Freq: Every day | ORAL | Status: DC

## 2014-08-03 LAB — COMPLETE METABOLIC PANEL WITH GFR
ALT: 16 U/L (ref 0–53)
AST: 16 U/L (ref 0–37)
Albumin: 4.2 g/dL (ref 3.5–5.2)
Alkaline Phosphatase: 86 U/L (ref 39–117)
BUN: 21 mg/dL (ref 6–23)
CALCIUM: 9 mg/dL (ref 8.4–10.5)
CHLORIDE: 94 meq/L — AB (ref 96–112)
CO2: 27 meq/L (ref 19–32)
CREATININE: 1.1 mg/dL (ref 0.50–1.35)
GFR, EST AFRICAN AMERICAN: 82 mL/min
GFR, EST NON AFRICAN AMERICAN: 71 mL/min
GLUCOSE: 242 mg/dL — AB (ref 70–99)
Potassium: 4.6 mEq/L (ref 3.5–5.3)
Sodium: 136 mEq/L (ref 135–145)
Total Bilirubin: 0.5 mg/dL (ref 0.2–1.2)
Total Protein: 7 g/dL (ref 6.0–8.3)

## 2014-08-03 LAB — FOLATE: Folate: >20 ng/mL

## 2014-08-03 LAB — VITAMIN B12: Vitamin B-12: 569 pg/mL (ref 211–911)

## 2014-08-04 ENCOUNTER — Encounter: Payer: Self-pay | Admitting: Family Medicine

## 2014-08-04 NOTE — Progress Notes (Signed)
Subjective:    Patient ID: [name], male    DOB: Oct 04, 1951, 63 y.o.   MRN: [id number] This chart was scribed for Shawnee Knapp, MD by Rosary Lively, ED scribe. This patient was seen in room Room/bed 25 and the patient's care was started at 2:45 PM.   Chief Complaint  Patient presents with  . Follow-up    diabetes      HPI  HPI Comments:  [name] is a 63 y.o. male who presents to [facility] for a follow-up visit.  At last visit, I increased Glucotrol from 5 mg to 10 mg because A1c was up to 9.9 despite diet and exercise efforts. I had meant to refill Lipitor at last visit but it was never done, so pt should have run out of his Lipitor five months ago.   Swelling in Legs: Pt reports that his right leg is a little more swollen than the left. Pt reports that this began 2 days ago, but prior to that he had not experienced any significant swelling.  Blood Sugar: Pt reports that he has been walking more, along with exercise at the gym.  Glucotrol: Pt reports that medication has been working fine, and he has refilled it.   Lipitor: Pt reports that he just ran out of medication this past week.    Past Medical History  Diagnosis Date  . Atrial fibrillation   . Diabetes mellitus without complication   . Cataract   . Hyperlipidemia   . Hypertension   . Lichen planus     bilateral legs   Current Outpatient Prescriptions on File Prior to Visit  Medication Sig Dispense Refill  . apixaban (ELIQUIS) 5 MG TABS tablet Take 1 tablet (5 mg total) by mouth 2 (two) times daily.  180 tablet  3  . cyanocobalamin 500 MCG tablet Take 500 mcg by mouth daily.      . furosemide (LASIX) 40 MG tablet Take 1 tablet (40 mg total) by mouth 2 (two) times daily.  180 tablet  1  . glipiZIDE (GLUCOTROL XL) 10 MG 24 hr tablet Take 1 tablet (10 mg total) by mouth daily.  90 tablet  3  . losartan (COZAAR) 25 MG tablet Take 1 tablet (25 mg total) by mouth daily.  90 tablet  1  . metFORMIN (GLUCOPHAGE) 1000 MG  tablet Take 1 tablet (1,000 mg total) by mouth 2 (two) times daily with a meal.  180 tablet  3  . metoprolol (LOPRESSOR) 50 MG tablet TAKE 1 AND 1/2 TABLETS BY MOUTH TWICE DAILY  270 tablet  1  . Multiple Vitamin (MULTIVITAMIN) capsule Take 1 capsule by mouth daily.      . Omega 3 1200 MG CAPS Take 2 capsules by mouth daily.      [name] OVER THE COUNTER MEDICATION OTC Calcium w/ Vitamin D 3 taking      . OVER THE COUNTER MEDICATION OTC Magnesium 400 mg taking      . potassium chloride SA (K-DUR,KLOR-CON) 20 MEQ tablet Take 1 tablet (20 mEq total) by mouth 2 (two) times daily.  180 tablet  1  . ranitidine (ZANTAC) 150 MG capsule Take 150 mg by mouth as needed for heartburn.      . sildenafil (VIAGRA) 100 MG tablet Take 0.5-1 tablets (50-100 mg total) by mouth daily as needed for erectile dysfunction.  5 tablet  2   No current facility-administered medications on file prior to visit.   Allergies  Allergen Reactions  .  Other     [name] bandage (self-gripping) - erythema, itching     Review of Systems  Constitutional: Negative for fever, chills, activity change, appetite change and unexpected weight change.  Cardiovascular: Positive for leg swelling.  Musculoskeletal: Positive for arthralgias, back pain, gait problem, joint swelling and myalgias.  Skin: Positive for color change and rash. Negative for wound.  Neurological: Positive for numbness.       Objective:   Physical Exam  Nursing note and vitals reviewed. Constitutional: He is oriented to person, place, and time. He appears well-developed and well-nourished.  HENT:  Head: Normocephalic and atraumatic.  Eyes: EOM are normal.  Neck: Normal range of motion. Neck supple.  Cardiovascular: Normal rate.  A regularly irregular rhythm present.  Pulmonary/Chest: Effort normal and breath sounds normal.  Musculoskeletal: Normal range of motion.       Right lower leg: He exhibits swelling (Macular blanching, mild erythema, with 3+  pitting edema. Two buule with serosanguinas drainage, mild sites of skin breakdown and ulceration).  Neurological: He is alert and oriented to person, place, and time.  Skin: Skin is warm and dry.  Psychiatric: He has a normal mood and affect. His behavior is normal.   BP 122/70  Pulse 78  Temp(Src) 98.5 F (36.9 C) (Oral)  Resp 16  Ht 6' 1.5" (1.867 m)  Wt 347 lb 8 oz (157.625 kg)  BMI 45.22 kg/m2  SpO2 94%  3:15 PM Unna boot applied bilaterally, and ace bandage also applied      Assessment & Plan:     Anemia, unspecified - Plan: COMPLETE METABOLIC PANEL WITH GFR, POCT CBC  Type II or unspecified type diabetes mellitus without mention of complication, not stated as uncontrolled - Plan: POCT glycosylated hemoglobin (Hb A1C), COMPLETE METABOLIC PANEL WITH GFR - pt really wants to cont working on diet and exercise before starting additional med. Cont metofrmin and glipizide but pt encouraged to reconsider starting GLP-1 to help w/ cbg control and weightloss - readdress at f/u if a1c not improving sig.  Encounter for long-term (current) use of other medications  Venous stasis dermatitis of both lower extremities - Pt to return to clinic in 3 to 4 days for unna boot removal and recheck on legs w/ edema and rash.  Macrocytic anemia - Plan: Vitamin B12, Folate  Other and unspecified hyperlipidemia - Lipitor changed to Prevastatin for cost.   Meds ordered this encounter  Medications  . pravastatin (PRAVACHOL) 40 MG tablet    Sig: Take 1 tablet (40 mg total) by mouth at bedtime.    Dispense:  90 tablet    Refill:  1    I personally performed the services described in this documentation, which was scribed in my presence. The recorded information has been reviewed and considered, and addended by me as needed.  Delman Cheadle, MD MPH

## 2014-08-06 ENCOUNTER — Ambulatory Visit (INDEPENDENT_AMBULATORY_CARE_PROVIDER_SITE_OTHER): Payer: 59 | Admitting: Family Medicine

## 2014-08-06 VITALS — BP 118/60 | HR 90 | Temp 98.1°F | Resp 16 | Ht 73.0 in | Wt 346.8 lb

## 2014-08-06 DIAGNOSIS — I8310 Varicose veins of unspecified lower extremity with inflammation: Secondary | ICD-10-CM

## 2014-08-06 DIAGNOSIS — B351 Tinea unguium: Secondary | ICD-10-CM

## 2014-08-06 DIAGNOSIS — I872 Venous insufficiency (chronic) (peripheral): Secondary | ICD-10-CM

## 2014-08-06 DIAGNOSIS — B353 Tinea pedis: Secondary | ICD-10-CM

## 2014-08-06 DIAGNOSIS — L439 Lichen planus, unspecified: Secondary | ICD-10-CM

## 2014-08-06 DIAGNOSIS — S81811A Laceration without foreign body, right lower leg, initial encounter: Secondary | ICD-10-CM

## 2014-08-06 MED ORDER — TERBINAFINE HCL 250 MG PO TABS: 250.0000 mg | ORAL_TABLET | Freq: Every day | ORAL | Status: DC

## 2014-08-06 NOTE — Patient Instructions (Signed)
Athlete's Foot Athlete's foot (tinea pedis) is a fungal infection of the skin on the feet. It often occurs on the skin between the toes or underneath the toes. It can also occur on the soles of the feet. Athlete's foot is more likely to occur in hot, humid weather. Not washing your feet or changing your socks often enough can contribute to athlete's foot. The infection can spread from person to person (contagious). CAUSES Athlete's foot is caused by a fungus. This fungus thrives in warm, moist places. Most people get athlete's foot by sharing shower stalls, towels, and wet floors with an infected person. People with weakened immune systems, including those with diabetes, may be more likely to get athlete's foot. SYMPTOMS   Itchy areas between the toes or on the soles of the feet.  White, flaky, or scaly areas between the toes or on the soles of the feet.  Tiny, intensely itchy blisters between the toes or on the soles of the feet.  Tiny cuts on the skin. These cuts can develop a bacterial infection.  Thick or discolored toenails. DIAGNOSIS  Your caregiver can usually tell what the problem is by doing a physical exam. Your caregiver may also take a skin sample from the rash area. The skin sample may be examined under a microscope, or it may be tested to see if fungus will grow in the sample. A sample may also be taken from your toenail for testing. TREATMENT  Over-the-counter and prescription medicines can be used to kill the fungus. These medicines are available as powders or creams. Your caregiver can suggest medicines for you. Fungal infections respond slowly to treatment. You may need to continue using your medicine for several weeks. PREVENTION   Do not share towels.  Wear sandals in wet areas, such as shared locker rooms and shared showers.  Keep your feet dry. Wear shoes that allow air to circulate. Wear cotton or wool socks. HOME CARE INSTRUCTIONS   Take medicines as directed by  your caregiver. Do not use steroid creams on athlete's foot.  Keep your feet clean and cool. Wash your feet daily and dry them thoroughly, especially between your toes.  Change your socks every day. Wear cotton or wool socks. In hot climates, you may need to change your socks 2 to 3 times per day.  Wear sandals or canvas tennis shoes with good air circulation.  If you have blisters, soak your feet in Burow's solution or Epsom salts for 20 to 30 minutes, 2 times a day to dry out the blisters. Make sure you dry your feet thoroughly afterward. SEEK MEDICAL CARE IF:   You have a fever.  You have swelling, soreness, warmth, or redness in your foot.  You are not getting better after 7 days of treatment.  You are not completely cured after 30 days.  You have any problems caused by your medicines. MAKE SURE YOU:   Understand these instructions.  Will watch your condition.  Will get help right away if you are not doing well or get worse. Document Released: 11/26/2000 Document Revised: 02/21/2012 Document Reviewed: 09/17/2011 [facility] Patient Information 2015 [place], [place]. This information is not intended to replace advice given to you by your health care provider. Make sure you discuss any questions you have with your health care provider. Onychomycosis/Fungal Toenails  WHAT IS IT? An infection that lies within the keratin of your nail plate that is caused by a fungus.  WHY ME? Fungal infections affect all ages, sexes,  races, and creeds.  There may be many factors that predispose you to a fungal infection such as age, coexisting medical conditions such as diabetes, or an autoimmune disease; stress, medications, fatigue, genetics, etc.  Bottom line: fungus thrives in a warm, moist environment and your shoes offer such a location.  IS IT CONTAGIOUS? Theoretically, yes.  You do not want to share shoes, nail clippers or files with someone who has fungal toenails.  Walking around barefoot in  the same room or sleeping in the same bed is unlikely to transfer the organism.  It is important to realize, however, that fungus can spread easily from one nail to the next on the same foot.  HOW DO WE TREAT THIS?  There are several ways to treat this condition.  Treatment may depend on many factors such as age, medications, pregnancy, liver and kidney conditions, etc.  It is best to ask your doctor which options are available to you.  1. No treatment.   Unlike many other medical concerns, you can live with this condition.  However for many people this can be a painful condition and may lead to ingrown toenails or a bacterial infection.  It is recommended that you keep the nails cut short to help reduce the amount of fungal nail. 2. Topical treatment.  These range from herbal remedies to prescription strength nail lacquers.  About 40-50% effective, topicals require twice daily application for approximately 9 to 12 months or until an entirely new nail has grown out.  The most effective topicals are medical grade medications available through physicians offices. 3. Oral antifungal medications.  With an 80-90% cure rate, the most common oral medication requires 3 to 4 months of therapy and stays in your system for a year as the new nail grows out.  Oral antifungal medications do require blood work to make sure it is a safe drug for you.  A liver function panel will be performed prior to starting the medication and after the first month of treatment.  It is important to have the blood work performed to avoid any harmful side effects.  In general, this medication safe but blood work is required. 4. Laser Therapy.  This treatment is performed by applying a specialized laser to the affected nail plate.  This therapy is noninvasive, fast, and non-painful.  It is not covered by insurance and is therefore, out of pocket.  The results have been very good with a 80-95% cure rate.  [facility] is the only  practice in the area to offer this therapy. 5. Permanent Nail Avulsion.  Removing the entire nail so that a new nail will not grow back.

## 2014-08-06 NOTE — Progress Notes (Signed)
Subjective:    Patient ID: [name], male    DOB: October 02, 1951, 63 y.o.   MRN: [id number] Chief Complaint  Patient presents with  . Follow-up    legs    HPI  Hasa h/o being really resistant to lidocaine, pain medicine, and alcohol.  If he needs tylenol - has to take a lot at a time -= like 6 just to treat a headache. Had blepharoplasty - woke up during it and could feel everything and they had to stop and get him under and same thing with a root canal. No effect from nitrous oxzide.  Past Medical History  Diagnosis Date  . Atrial fibrillation   . Diabetes mellitus without complication   . Cataract   . Hyperlipidemia   . Hypertension   . Lichen planus     bilateral legs   Current Outpatient Prescriptions on File Prior to Visit  Medication Sig Dispense Refill  . apixaban (ELIQUIS) 5 MG TABS tablet Take 1 tablet (5 mg total) by mouth 2 (two) times daily.  180 tablet  3  . cyanocobalamin 500 MCG tablet Take 500 mcg by mouth daily.      . furosemide (LASIX) 40 MG tablet Take 1 tablet (40 mg total) by mouth 2 (two) times daily.  180 tablet  1  . glipiZIDE (GLUCOTROL XL) 10 MG 24 hr tablet Take 1 tablet (10 mg total) by mouth daily.  90 tablet  3  . losartan (COZAAR) 25 MG tablet Take 1 tablet (25 mg total) by mouth daily.  90 tablet  1  . metFORMIN (GLUCOPHAGE) 1000 MG tablet Take 1 tablet (1,000 mg total) by mouth 2 (two) times daily with a meal.  180 tablet  3  . metoprolol (LOPRESSOR) 50 MG tablet TAKE 1 AND 1/2 TABLETS BY MOUTH TWICE DAILY  270 tablet  1  . Multiple Vitamin (MULTIVITAMIN) capsule Take 1 capsule by mouth daily.      . Omega 3 1200 MG CAPS Take 2 capsules by mouth daily.      [name] OVER THE COUNTER MEDICATION OTC Calcium w/ Vitamin D 3 taking      . OVER THE COUNTER MEDICATION OTC Magnesium 400 mg taking      . potassium chloride SA (K-DUR,KLOR-CON) 20 MEQ tablet Take 1 tablet (20 mEq total) by mouth 2 (two) times daily.  180 tablet  1  . pravastatin (PRAVACHOL) 40  MG tablet Take 1 tablet (40 mg total) by mouth at bedtime.  90 tablet  1  . ranitidine (ZANTAC) 150 MG capsule Take 150 mg by mouth as needed for heartburn.      . sildenafil (VIAGRA) 100 MG tablet Take 0.5-1 tablets (50-100 mg total) by mouth daily as needed for erectile dysfunction.  5 tablet  2   No current facility-administered medications on file prior to visit.   Allergies  Allergen Reactions  . Other     [name] bandage (self-gripping) - erythema, itching     Review of Systems  Constitutional: Negative for fever, chills, diaphoresis, activity change, appetite change and unexpected weight change.  Cardiovascular: Positive for leg swelling.  Skin: Positive for color change, rash and wound. Negative for pallor.  Neurological: Positive for weakness. Negative for numbness.       Objective:  BP 118/60  Pulse 90  Temp(Src) 98.1 F (36.7 C) (Oral)  Resp 16  Ht 6\' 1"  (1.854 m)  Wt 346 lb 12.8 oz (157.307 kg)  BMI 45.76 kg/m2  SpO2 97%  Physical Exam  Constitutional: He is oriented to person, place, and time. He appears well-developed and well-nourished. No distress.  HENT:  Head: Normocephalic and atraumatic.  Eyes: No scleral icterus.  Pulmonary/Chest: Effort normal.  Neurological: He is alert and oriented to person, place, and time.  Skin: Skin is warm and dry. He is not diaphoretic.  Psychiatric: He has a normal mood and affect. His behavior is normal.          Assessment & Plan:   Onychomycosis  Tinea pedis of both feet  Lichen planus  Venous stasis dermatitis of both lower extremities - doing MUCH better after unna boots.  Skin tear of lower leg without complication, right, initial encounter  Meds ordered this encounter  Medications  . terbinafine (LAMISIL) 250 MG tablet    Sig: Take 1 tablet (250 mg total) by mouth daily.    Dispense:  90 tablet    Refill:  1     Delman Cheadle, MD MPH

## 2014-10-25 ENCOUNTER — Ambulatory Visit: Payer: 59 | Admitting: Family Medicine

## 2014-10-25 ENCOUNTER — Other Ambulatory Visit: Payer: Self-pay | Admitting: Family Medicine

## 2014-10-26 ENCOUNTER — Other Ambulatory Visit: Payer: Self-pay | Admitting: Family Medicine

## 2014-10-26 NOTE — Telephone Encounter (Signed)
Needs office visit.

## 2014-11-25 ENCOUNTER — Other Ambulatory Visit: Payer: Self-pay | Admitting: Family Medicine

## 2015-01-17 ENCOUNTER — Ambulatory Visit: Payer: 59 | Admitting: Family Medicine

## 2015-03-07 ENCOUNTER — Ambulatory Visit: Payer: 59 | Admitting: Family Medicine

## 2015-05-15 ENCOUNTER — Ambulatory Visit: Payer: Medicare PPO | Attending: Internal Medicine

## 2015-07-16 ENCOUNTER — Encounter: Payer: Self-pay | Admitting: *Deleted

## 2016-08-09 ENCOUNTER — Ambulatory Visit: Payer: 59

## 2016-09-01 DIAGNOSIS — E119 Type 2 diabetes mellitus without complications: Secondary | ICD-10-CM | POA: Diagnosis not present

## 2016-09-01 DIAGNOSIS — Z7901 Long term (current) use of anticoagulants: Secondary | ICD-10-CM | POA: Diagnosis not present

## 2016-09-01 DIAGNOSIS — E559 Vitamin D deficiency, unspecified: Secondary | ICD-10-CM | POA: Diagnosis not present

## 2016-09-01 DIAGNOSIS — F329 Major depressive disorder, single episode, unspecified: Secondary | ICD-10-CM | POA: Diagnosis not present

## 2016-09-01 DIAGNOSIS — R5383 Other fatigue: Secondary | ICD-10-CM | POA: Diagnosis not present

## 2016-09-01 DIAGNOSIS — R0602 Shortness of breath: Secondary | ICD-10-CM | POA: Diagnosis not present

## 2016-09-01 DIAGNOSIS — Z0000 Encounter for general adult medical examination without abnormal findings: Secondary | ICD-10-CM | POA: Diagnosis not present

## 2016-09-01 DIAGNOSIS — Z1389 Encounter for screening for other disorder: Secondary | ICD-10-CM | POA: Diagnosis not present

## 2016-09-07 DIAGNOSIS — Z136 Encounter for screening for cardiovascular disorders: Secondary | ICD-10-CM | POA: Diagnosis not present

## 2016-09-15 DIAGNOSIS — E7800 Pure hypercholesterolemia, unspecified: Secondary | ICD-10-CM | POA: Diagnosis not present

## 2016-09-15 DIAGNOSIS — E559 Vitamin D deficiency, unspecified: Secondary | ICD-10-CM | POA: Diagnosis not present

## 2016-09-15 DIAGNOSIS — R0602 Shortness of breath: Secondary | ICD-10-CM | POA: Diagnosis not present

## 2016-11-05 NOTE — Congregational Nurse Program (Signed)
Congregational Nurse Program Note  Date of Encounter: 11/05/2016  Past Medical History: Past Medical History:  Diagnosis Date  . Atrial fibrillation   . Cataract   . Diabetes mellitus without complication   . Hyperlipidemia   . Hypertension   . Lichen planus    bilateral legs    Encounter Details:     CNP Questionnaire - 11/05/16 1104      Patient Demographics   Is this a new or existing patient? New   Patient is considered a/an Not Applicable   Race Caucasian/White     Patient Assistance   Location of Patient Assistance Not Applicable   Patient's financial/insurance status Low Income;Medicare   Patient referred to apply for the following financial assistance Not Applicable   Food insecurities addressed Not Applicable   Transportation assistance No   Assistance securing medications No   Educational health offerings Not Applicable     Encounter Details   Primary purpose of visit Spiritual Care/Support Visit   Was an Emergency Department visit averted? Not Applicable   Does patient have a medical provider? Yes   Patient referred to Not Applicable   Was a mental health screening completed? (GAINS tool) No   Does patient have dental issues? No   Does patient have vision issues? No   Does your patient have an abnormal blood pressure today? No   Since previous encounter, have you referred patient for abnormal blood pressure that resulted in a new diagnosis or medication change? No   Does your patient have an abnormal blood glucose today? No   Since previous encounter, have you referred patient for abnormal blood glucose that resulted in a new diagnosis or medication change? No   Was there a life-saving intervention made? No     This gentleman just arrived to the shelter.  Seems frightened and possibly some confusion.  Is oriented to person, place and time but seems unclear about his situation.  Asked to see the nurse, stating he was "constipated", not having a "good" BM  for several days.  Has taken Miralax but states was not helpful.  Discussed with client about increasing fluids and continuing on the Miralax.  Also discussed with client the need to focus on the moment and complete his intake for shelter placement.  Shelter staff will coordinate case management.

## 2016-11-08 ENCOUNTER — Emergency Department (HOSPITAL_COMMUNITY): Payer: PPO

## 2016-11-08 ENCOUNTER — Encounter: Payer: Self-pay | Admitting: Pediatric Intensive Care

## 2016-11-08 ENCOUNTER — Inpatient Hospital Stay (HOSPITAL_COMMUNITY)
Admission: EM | Admit: 2016-11-08 | Discharge: 2016-11-26 | DRG: 329 | Disposition: A | Discharge status: Skilled Nursing Facility | Payer: PPO | Attending: Internal Medicine | Admitting: Internal Medicine

## 2016-11-08 ENCOUNTER — Encounter (HOSPITAL_COMMUNITY): Payer: Self-pay | Admitting: Emergency Medicine

## 2016-11-08 DIAGNOSIS — I10 Essential (primary) hypertension: Secondary | ICD-10-CM | POA: Diagnosis not present

## 2016-11-08 DIAGNOSIS — E871 Hypo-osmolality and hyponatremia: Secondary | ICD-10-CM | POA: Diagnosis not present

## 2016-11-08 DIAGNOSIS — I482 Chronic atrial fibrillation: Secondary | ICD-10-CM | POA: Diagnosis present

## 2016-11-08 DIAGNOSIS — K5901 Slow transit constipation: Secondary | ICD-10-CM | POA: Diagnosis not present

## 2016-11-08 DIAGNOSIS — E1122 Type 2 diabetes mellitus with diabetic chronic kidney disease: Secondary | ICD-10-CM | POA: Diagnosis present

## 2016-11-08 DIAGNOSIS — Z9114 Patient's other noncompliance with medication regimen: Secondary | ICD-10-CM

## 2016-11-08 DIAGNOSIS — K660 Peritoneal adhesions (postprocedural) (postinfection): Secondary | ICD-10-CM | POA: Diagnosis present

## 2016-11-08 DIAGNOSIS — R933 Abnormal findings on diagnostic imaging of other parts of digestive tract: Secondary | ICD-10-CM | POA: Diagnosis not present

## 2016-11-08 DIAGNOSIS — K648 Other hemorrhoids: Secondary | ICD-10-CM | POA: Diagnosis not present

## 2016-11-08 DIAGNOSIS — I4892 Unspecified atrial flutter: Secondary | ICD-10-CM | POA: Diagnosis not present

## 2016-11-08 DIAGNOSIS — K5649 Other impaction of intestine: Secondary | ICD-10-CM

## 2016-11-08 DIAGNOSIS — N183 Chronic kidney disease, stage 3 unspecified: Secondary | ICD-10-CM | POA: Diagnosis present

## 2016-11-08 DIAGNOSIS — E1165 Type 2 diabetes mellitus with hyperglycemia: Secondary | ICD-10-CM | POA: Diagnosis not present

## 2016-11-08 DIAGNOSIS — D696 Thrombocytopenia, unspecified: Secondary | ICD-10-CM | POA: Diagnosis present

## 2016-11-08 DIAGNOSIS — I481 Persistent atrial fibrillation: Secondary | ICD-10-CM | POA: Diagnosis not present

## 2016-11-08 DIAGNOSIS — R17 Unspecified jaundice: Secondary | ICD-10-CM | POA: Diagnosis present

## 2016-11-08 DIAGNOSIS — N401 Enlarged prostate with lower urinary tract symptoms: Secondary | ICD-10-CM | POA: Diagnosis present

## 2016-11-08 DIAGNOSIS — E876 Hypokalemia: Secondary | ICD-10-CM | POA: Diagnosis present

## 2016-11-08 DIAGNOSIS — K5641 Fecal impaction: Secondary | ICD-10-CM

## 2016-11-08 DIAGNOSIS — Z87891 Personal history of nicotine dependence: Secondary | ICD-10-CM

## 2016-11-08 DIAGNOSIS — I452 Bifascicular block: Secondary | ICD-10-CM | POA: Diagnosis not present

## 2016-11-08 DIAGNOSIS — K626 Ulcer of anus and rectum: Secondary | ICD-10-CM | POA: Diagnosis present

## 2016-11-08 DIAGNOSIS — K635 Polyp of colon: Secondary | ICD-10-CM

## 2016-11-08 DIAGNOSIS — Z9119 Patient's noncompliance with other medical treatment and regimen: Secondary | ICD-10-CM

## 2016-11-08 DIAGNOSIS — D649 Anemia, unspecified: Secondary | ICD-10-CM | POA: Diagnosis present

## 2016-11-08 DIAGNOSIS — K639 Disease of intestine, unspecified: Secondary | ICD-10-CM | POA: Diagnosis present

## 2016-11-08 DIAGNOSIS — E440 Moderate protein-calorie malnutrition: Secondary | ICD-10-CM | POA: Insufficient documentation

## 2016-11-08 DIAGNOSIS — D120 Benign neoplasm of cecum: Principal | ICD-10-CM

## 2016-11-08 DIAGNOSIS — E1169 Type 2 diabetes mellitus with other specified complication: Secondary | ICD-10-CM | POA: Diagnosis present

## 2016-11-08 DIAGNOSIS — Z79899 Other long term (current) drug therapy: Secondary | ICD-10-CM

## 2016-11-08 DIAGNOSIS — R7989 Other specified abnormal findings of blood chemistry: Secondary | ICD-10-CM | POA: Diagnosis present

## 2016-11-08 DIAGNOSIS — H269 Unspecified cataract: Secondary | ICD-10-CM | POA: Diagnosis present

## 2016-11-08 DIAGNOSIS — R1084 Generalized abdominal pain: Secondary | ICD-10-CM | POA: Diagnosis not present

## 2016-11-08 DIAGNOSIS — N390 Urinary tract infection, site not specified: Secondary | ICD-10-CM | POA: Diagnosis not present

## 2016-11-08 DIAGNOSIS — I129 Hypertensive chronic kidney disease with stage 1 through stage 4 chronic kidney disease, or unspecified chronic kidney disease: Secondary | ICD-10-CM | POA: Diagnosis present

## 2016-11-08 DIAGNOSIS — K5730 Diverticulosis of large intestine without perforation or abscess without bleeding: Secondary | ICD-10-CM | POA: Diagnosis present

## 2016-11-08 DIAGNOSIS — R000 Tachycardia, unspecified: Secondary | ICD-10-CM | POA: Diagnosis not present

## 2016-11-08 DIAGNOSIS — E1141 Type 2 diabetes mellitus with diabetic mononeuropathy: Secondary | ICD-10-CM | POA: Diagnosis not present

## 2016-11-08 DIAGNOSIS — E669 Obesity, unspecified: Secondary | ICD-10-CM | POA: Diagnosis present

## 2016-11-08 DIAGNOSIS — K633 Ulcer of intestine: Secondary | ICD-10-CM | POA: Diagnosis not present

## 2016-11-08 DIAGNOSIS — F329 Major depressive disorder, single episode, unspecified: Secondary | ICD-10-CM | POA: Diagnosis present

## 2016-11-08 DIAGNOSIS — Z09 Encounter for follow-up examination after completed treatment for conditions other than malignant neoplasm: Secondary | ICD-10-CM

## 2016-11-08 DIAGNOSIS — I4891 Unspecified atrial fibrillation: Secondary | ICD-10-CM | POA: Diagnosis present

## 2016-11-08 DIAGNOSIS — Z888 Allergy status to other drugs, medicaments and biological substances status: Secondary | ICD-10-CM

## 2016-11-08 DIAGNOSIS — R109 Unspecified abdominal pain: Secondary | ICD-10-CM | POA: Diagnosis not present

## 2016-11-08 DIAGNOSIS — E860 Dehydration: Secondary | ICD-10-CM | POA: Diagnosis present

## 2016-11-08 DIAGNOSIS — Z5900 Homelessness unspecified: Secondary | ICD-10-CM

## 2016-11-08 DIAGNOSIS — K5909 Other constipation: Secondary | ICD-10-CM | POA: Diagnosis present

## 2016-11-08 DIAGNOSIS — I959 Hypotension, unspecified: Secondary | ICD-10-CM | POA: Diagnosis not present

## 2016-11-08 DIAGNOSIS — K5900 Constipation, unspecified: Secondary | ICD-10-CM | POA: Diagnosis not present

## 2016-11-08 DIAGNOSIS — N179 Acute kidney failure, unspecified: Secondary | ICD-10-CM

## 2016-11-08 DIAGNOSIS — E43 Unspecified severe protein-calorie malnutrition: Secondary | ICD-10-CM | POA: Diagnosis not present

## 2016-11-08 DIAGNOSIS — R531 Weakness: Secondary | ICD-10-CM

## 2016-11-08 DIAGNOSIS — Z794 Long term (current) use of insulin: Secondary | ICD-10-CM

## 2016-11-08 DIAGNOSIS — R262 Difficulty in walking, not elsewhere classified: Secondary | ICD-10-CM | POA: Diagnosis present

## 2016-11-08 DIAGNOSIS — E119 Type 2 diabetes mellitus without complications: Secondary | ICD-10-CM | POA: Diagnosis present

## 2016-11-08 DIAGNOSIS — I480 Paroxysmal atrial fibrillation: Secondary | ICD-10-CM | POA: Diagnosis not present

## 2016-11-08 DIAGNOSIS — E785 Hyperlipidemia, unspecified: Secondary | ICD-10-CM | POA: Diagnosis present

## 2016-11-08 DIAGNOSIS — N400 Enlarged prostate without lower urinary tract symptoms: Secondary | ICD-10-CM | POA: Diagnosis present

## 2016-11-08 DIAGNOSIS — E878 Other disorders of electrolyte and fluid balance, not elsewhere classified: Secondary | ICD-10-CM | POA: Diagnosis not present

## 2016-11-08 DIAGNOSIS — D539 Nutritional anemia, unspecified: Secondary | ICD-10-CM | POA: Diagnosis present

## 2016-11-08 DIAGNOSIS — Z6825 Body mass index (BMI) 25.0-25.9, adult: Secondary | ICD-10-CM

## 2016-11-08 DIAGNOSIS — R338 Other retention of urine: Secondary | ICD-10-CM | POA: Diagnosis present

## 2016-11-08 LAB — BASIC METABOLIC PANEL
ANION GAP: 15 (ref 5–15)
BUN: 36 mg/dL — ABNORMAL HIGH (ref 6–20)
CHLORIDE: 89 mmol/L — AB (ref 101–111)
CO2: 24 mmol/L (ref 22–32)
CREATININE: 1.36 mg/dL — AB (ref 0.61–1.24)
Calcium: 9.4 mg/dL (ref 8.9–10.3)
GFR calc non Af Amer: 53 mL/min — ABNORMAL LOW (ref >60)
Glucose, Bld: 130 mg/dL — ABNORMAL HIGH (ref 65–99)
POTASSIUM: 3.3 mmol/L — AB (ref 3.5–5.1)
Sodium: 128 mmol/L — ABNORMAL LOW (ref 135–145)

## 2016-11-08 LAB — LIPASE, BLOOD: Lipase: 25 U/L (ref 11–51)

## 2016-11-08 LAB — URINALYSIS, ROUTINE W REFLEX MICROSCOPIC
GLUCOSE, UA: 100 mg/dL — AB
HGB URINE DIPSTICK: NEGATIVE
Ketones, ur: NEGATIVE mg/dL
Nitrite: POSITIVE — AB
PH: 6 (ref 5.0–8.0)
Protein, ur: 30 mg/dL — AB
SPECIFIC GRAVITY, URINE: 1.03 (ref 1.005–1.030)

## 2016-11-08 LAB — CBC
HEMATOCRIT: 38 % — AB (ref 39.0–52.0)
HEMOGLOBIN: 13.6 g/dL (ref 13.0–17.0)
MCH: 32.6 pg (ref 26.0–34.0)
MCHC: 35.8 g/dL (ref 30.0–36.0)
MCV: 91.1 fL (ref 78.0–100.0)
Platelets: 163 10*3/uL (ref 150–400)
RBC: 4.17 MIL/uL — AB (ref 4.22–5.81)
RDW: 13.6 % (ref 11.5–15.5)
WBC: 6.6 10*3/uL (ref 4.0–10.5)

## 2016-11-08 LAB — HEPATIC FUNCTION PANEL
ALBUMIN: 4.2 g/dL (ref 3.5–5.0)
ALT: 11 U/L — AB (ref 17–63)
AST: 17 U/L (ref 15–41)
Alkaline Phosphatase: 76 U/L (ref 38–126)
BILIRUBIN DIRECT: 0.6 mg/dL — AB (ref 0.1–0.5)
BILIRUBIN TOTAL: 2.1 mg/dL — AB (ref 0.3–1.2)
Indirect Bilirubin: 1.5 mg/dL — ABNORMAL HIGH (ref 0.3–0.9)
Total Protein: 7.7 g/dL (ref 6.5–8.1)

## 2016-11-08 LAB — DIFFERENTIAL
BASOS ABS: 0 10*3/uL (ref 0.0–0.1)
Basophils Relative: 0 %
Eosinophils Absolute: 0 10*3/uL (ref 0.0–0.7)
Eosinophils Relative: 0 %
LYMPHS PCT: 12 %
Lymphs Abs: 0.8 10*3/uL (ref 0.7–4.0)
MONO ABS: 0.4 10*3/uL (ref 0.1–1.0)
MONOS PCT: 6 %
NEUTROS ABS: 5.5 10*3/uL (ref 1.7–7.7)
Neutrophils Relative %: 82 %

## 2016-11-08 LAB — I-STAT TROPONIN, ED: Troponin i, poc: 0.01 ng/mL (ref 0.00–0.08)

## 2016-11-08 LAB — CBG MONITORING, ED: Glucose-Capillary: 139 mg/dL — ABNORMAL HIGH (ref 65–99)

## 2016-11-08 LAB — URINE MICROSCOPIC-ADD ON
RBC / HPF: NONE SEEN RBC/hpf (ref 0–5)
SQUAMOUS EPITHELIAL / LPF: NONE SEEN

## 2016-11-08 LAB — I-STAT CG4 LACTIC ACID, ED: Lactic Acid, Venous: 3.63 mmol/L (ref 0.5–1.9)

## 2016-11-08 MED ORDER — BISACODYL 5 MG PO TBEC
5.0000 mg | DELAYED_RELEASE_TABLET | Freq: Every day | ORAL | Status: DC | PRN
  Administered 2016-11-13: 5 mg via ORAL
  Filled 2016-11-08 (×2): qty 1

## 2016-11-08 MED ORDER — IOPAMIDOL (ISOVUE-300) INJECTION 61%
100.0000 mL | Freq: Once | INTRAVENOUS | Status: AC | PRN
  Administered 2016-11-08: 100 mL via INTRAVENOUS

## 2016-11-08 MED ORDER — LORAZEPAM 1 MG PO TABS
1.0000 mg | ORAL_TABLET | Freq: Once | ORAL | Status: AC
  Administered 2016-11-08: 1 mg via ORAL
  Filled 2016-11-08: qty 1

## 2016-11-08 MED ORDER — NYSTATIN 100000 UNIT/GM EX POWD
Freq: Once | CUTANEOUS | Status: AC
  Administered 2016-11-08: 15:00:00 via TOPICAL
  Filled 2016-11-08: qty 15

## 2016-11-08 MED ORDER — ENOXAPARIN SODIUM 40 MG/0.4ML ~~LOC~~ SOLN
40.0000 mg | Freq: Every day | SUBCUTANEOUS | Status: DC
  Administered 2016-11-09: 40 mg via SUBCUTANEOUS
  Filled 2016-11-08: qty 0.4

## 2016-11-08 MED ORDER — ONDANSETRON HCL 4 MG PO TABS: 4.0000 mg | ORAL_TABLET | Freq: Four times a day (QID) | ORAL | Status: DC | PRN

## 2016-11-08 MED ORDER — FLEET ENEMA 7-19 GM/118ML RE ENEM
1.0000 | ENEMA | Freq: Once | RECTAL | Status: AC | PRN
  Administered 2016-11-10: 1 via RECTAL
  Filled 2016-11-08: qty 1

## 2016-11-08 MED ORDER — INSULIN ASPART 100 UNIT/ML ~~LOC~~ SOLN
0.0000 [IU] | Freq: Three times a day (TID) | SUBCUTANEOUS | Status: DC
  Administered 2016-11-09 – 2016-11-10 (×4): 2 [IU] via SUBCUTANEOUS
  Administered 2016-11-11: 3 [IU] via SUBCUTANEOUS
  Administered 2016-11-12 – 2016-11-14 (×2): 2 [IU] via SUBCUTANEOUS
  Administered 2016-11-15: 3 [IU] via SUBCUTANEOUS
  Administered 2016-11-15 – 2016-11-20 (×5): 2 [IU] via SUBCUTANEOUS
  Administered 2016-11-21: 3 [IU] via SUBCUTANEOUS
  Administered 2016-11-25 (×2): 2 [IU] via SUBCUTANEOUS
  Administered 2016-11-25: 3 [IU] via SUBCUTANEOUS
  Administered 2016-11-26: 2 [IU] via SUBCUTANEOUS
  Filled 2016-11-08: qty 1

## 2016-11-08 MED ORDER — POTASSIUM CHLORIDE IN NACL 20-0.9 MEQ/L-% IV SOLN
INTRAVENOUS | Status: DC
  Administered 2016-11-09 – 2016-11-12 (×10): via INTRAVENOUS
  Administered 2016-11-14: 1000 mL via INTRAVENOUS
  Administered 2016-11-14 – 2016-11-21 (×7): via INTRAVENOUS
  Filled 2016-11-08 (×20): qty 1000

## 2016-11-08 MED ORDER — SODIUM CHLORIDE 0.9% FLUSH
3.0000 mL | Freq: Two times a day (BID) | INTRAVENOUS | Status: DC
  Administered 2016-11-09 – 2016-11-25 (×10): 3 mL via INTRAVENOUS

## 2016-11-08 MED ORDER — SODIUM CHLORIDE 0.9 % IV BOLUS (SEPSIS)
1000.0000 mL | Freq: Once | INTRAVENOUS | Status: AC
  Administered 2016-11-08: 1000 mL via INTRAVENOUS

## 2016-11-08 MED ORDER — DOCUSATE SODIUM 100 MG PO CAPS
100.0000 mg | ORAL_CAPSULE | Freq: Two times a day (BID) | ORAL | Status: DC
  Administered 2016-11-09 – 2016-11-17 (×17): 100 mg via ORAL
  Filled 2016-11-08 (×17): qty 1

## 2016-11-08 MED ORDER — ONDANSETRON HCL 4 MG/2ML IJ SOLN: 4.0000 mg | Freq: Four times a day (QID) | INTRAMUSCULAR | Status: DC | PRN

## 2016-11-08 MED ORDER — METOPROLOL TARTRATE 5 MG/5ML IV SOLN
5.0000 mg | Freq: Once | INTRAVENOUS | Status: AC
  Administered 2016-11-08: 5 mg via INTRAVENOUS
  Filled 2016-11-08: qty 5

## 2016-11-08 MED ORDER — POLYETHYLENE GLYCOL 3350 17 G PO PACK
17.0000 g | PACK | Freq: Every day | ORAL | Status: DC | PRN
  Administered 2016-11-09: 17 g via ORAL
  Filled 2016-11-08: qty 1

## 2016-11-08 MED ORDER — IOPAMIDOL (ISOVUE-300) INJECTION 61%
INTRAVENOUS | Status: AC
  Filled 2016-11-08: qty 100

## 2016-11-08 MED ORDER — ACETAMINOPHEN 325 MG PO TABS
650.0000 mg | ORAL_TABLET | Freq: Four times a day (QID) | ORAL | Status: DC | PRN
  Administered 2016-11-09 (×3): 650 mg via ORAL
  Filled 2016-11-08 (×3): qty 2

## 2016-11-08 MED ORDER — HYDROCODONE-ACETAMINOPHEN 5-325 MG PO TABS
1.0000 | ORAL_TABLET | Freq: Once | ORAL | Status: AC
  Administered 2016-11-08: 1 via ORAL
  Filled 2016-11-08: qty 1

## 2016-11-08 MED ORDER — ACETAMINOPHEN 650 MG RE SUPP: 650.0000 mg | Freq: Four times a day (QID) | RECTAL | Status: DC | PRN

## 2016-11-08 MED ORDER — DEXTROSE 5 % IV SOLN
2.0000 g | Freq: Once | INTRAVENOUS | Status: AC
  Administered 2016-11-08: 2 g via INTRAVENOUS
  Filled 2016-11-08: qty 2

## 2016-11-08 MED ORDER — SODIUM CHLORIDE 0.9 % IV BOLUS (SEPSIS): 1000.0000 mL | Freq: Once | INTRAVENOUS | Status: DC

## 2016-11-08 MED ORDER — MAGNESIUM HYDROXIDE 400 MG/5ML PO SUSP
960.0000 mL | Freq: Once | ORAL | Status: AC
  Administered 2016-11-08: 960 mL via RECTAL
  Filled 2016-11-08: qty 240

## 2016-11-08 NOTE — ED Notes (Signed)
Still unable to collect labs PA and nurse are in the room

## 2016-11-08 NOTE — ED Triage Notes (Signed)
Pt presents to ED with RN from [facility]. Pt sts he has multiple complaints. Pt sts he has had one bowel Movement in two months. Pt sts he is extremely impacted. Pt sts because of this he hasn't been eating so as not to make constipation worse. Pt sts he has hx of constipation and has been on stool softeners in the past but is not taking them at this time. Pt  C/o generalized weakness. Per RN pt's BP was 98/60 at the clinic. Pt sts he passes gas often and has burning at his rectum when that happens. Pt thinks he might be "leaking" when he passes gas. A&Ox4. Pt seems to be a poor historian. 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 Pt sts he can't raise it very far. This has been going on x 3 months. Pt BP 93/60 at this time.

## 2016-11-08 NOTE — H&P (Signed)
History and Physical    [name] [id number] DOB: 14-Apr-1951 DOA: 11/08/2016  PCP: Delman Cheadle, MD - has been seeing her for several years, but they have changed the policy and they are appointment only and he owes money and so can't make an appointment.  New doctor in September - Dr. Nancy Fetter, [facility] on [facility]. Consultants:  None Patient coming from: homeless as of this week; Sister and brother and her husband were subsidizing him in a hotel and it expired this Wednesday.  Has been staying at [facility].  Chief Complaint: uncontrolled constipation  HPI: [name] is a 65 y.o. male with medical history significant of A. Fib not on anticoagulation, DM Type 2, HTN, HLD, depression - he is not taking medication for any of these problems and has not seen a physician in 2 years.  Patient with severe constipation, malnutrition, having trouble walking, having trouble keeping energy.  Right arm partially immobile - ?pinched nerve in neck for months.  "I let myself go" - car messed up, was paranoid about driving, stopped going to grocery and getting groceries.  Stopped eating.  Then started ordering pizza every couple of days.  1 BM in the last few months.  Lost over 100 pounds several months ago from not eating.  Mild dysuria for the past 2 days.   ED Course: Per PA-C [place]: 65 year old male presents with symptomatic hyponatremia. He is tachycardic and hypotensive with lowest BP of 93/60. 2L IVF given with some improvement in BP however he is persistent tachycardic. Metoprolol given which temporarily improved HR. CT head negative. CBC unremarkable. CMP remarkable for hyponatremia (128), hypokalemia (3.3), hypochloremia (89), SCr of 1.36 up from 1.1 2 years ago, glucose 130 - likely improved from significant weight loss, bilirubin 2.1. Lipase normal. Troponin is 0.01.   Constipation - CT A&P remarkable for constipation without evidence of obstruction. Fecal disimpaction attempted  without success. SMOG enema given with mild success. He will likely need a bowel regimen.  Hyponatremia - 2 L IVF given UTI - Rocephin given.   Due to patient multiple medical problems with poor follow up and profound weakness will ask hospitalist to admit for further management. Spoke with Dr. Lorin Mercy who will admit patient. Appreciate assistance very much.  Review of Systems: As per HPI; otherwise 10 point review of systems reviewed and negative.   Ambulatory Status:  Ambulates with a walker lately - only able to walk short distances and very slow and wobbly and he wouldn't be able to get up if he fell  Past Medical History:  Diagnosis Date  . Atrial fibrillation ([place])   . Cataract   . Diabetes mellitus without complication ([place])   . Hyperlipidemia   . Hypertension   . Lichen planus    bilateral legs    Past Surgical History:  Procedure Laterality Date  . APPENDECTOMY    . FRACTURE SURGERY    . MULTIPLE TOOTH EXTRACTIONS      Social History   Social History  . Marital status: Unknown    Spouse name: N/A  . Number of children: N/A  . Years of education: N/A   Occupational History  . unemployed    Social History Main Topics  . Smoking status: Former Smoker    Types: Pipe    Quit date: 09/14/1983  . Smokeless tobacco: Never Used  . Alcohol use No  . Drug use: No  . Sexual activity: No   Other Topics Concern  . Not  on file   Social History Narrative  . No narrative on file    Allergies  Allergen Reactions  . Other     [name] bandage (self-gripping) - erythema, itching    Family History  Problem Relation Age of Onset  . Brain cancer Mother   . Alzheimer's disease Father   . Cancer Maternal Grandmother   . Heart attack Maternal Grandfather   . Pneumonia Paternal Grandfather     Prior to Admission medications   Medication Sig Start Date End Date Taking? Authorizing Provider  buPROPion (WELLBUTRIN XL) 150 MG 24 hr tablet Take 150 mg by mouth  daily.    Historical Provider, MD  cyanocobalamin 500 MCG tablet Take 500 mcg by mouth daily.    Historical Provider, MD  Multiple Vitamin (MULTIVITAMIN) capsule Take 1 capsule by mouth daily.    Historical Provider, MD  Omega 3 1200 MG CAPS Take 2 capsules by mouth daily.    Historical Provider, MD  OVER THE COUNTER MEDICATION OTC Calcium w/ Vitamin D 3 taking    Historical Provider, MD  OVER THE COUNTER MEDICATION OTC Magnesium 400 mg taking    Historical Provider, MD  ranitidine (ZANTAC) 150 MG capsule Take 150 mg by mouth as needed for heartburn.    Historical Provider, MD    Physical Exam: Vitals:   11/08/16 1900 11/08/16 1930 11/08/16 1945 11/09/16 0133  BP: 102/67 109/83  104/68  Pulse: 69 (!) 124 (!) 123 103  Resp: 18 17 23 15   Temp:      TempSrc:      SpO2: 100% 100% 96% 100%     General: Sitting on bedside commode trying to have a BM throughout my exam, intermittent cries out in pain Eyes: EOMI, normal lids, iris ENT:  grossly normal hearing, lips & tongue, mmm Neck:  no LAD, masses or thyromegaly Cardiovascular:  Irregularly irregular rate/rhythm, tachycardia while straining to have BM, no m/r/g. No LE edema.  Respiratory:  CTA bilaterally, no w/r/r. Normal respiratory effort. Abdomen: soft, ntnd, NABS Skin: diffuse sloughing of skin with small patches of epidermis scattered all of the floor; significant stasis with excoriations on the B LE and diffuse sloughing Musculoskeletal:  grossly normal tone BUE/BLE, good ROM, no bony abnormality Psychiatric: depressed mood and affect, speech fluent and appropriate, AOx3 Neurologic: CN 2-12 grossly intact, moves all extremities in coordinated fashion, sensation intact  Labs on Admission: I have personally reviewed following labs and imaging studies  CBC:  Recent Labs Lab 11/08/16 1411  WBC 6.6  NEUTROABS 5.5  HGB 13.6  HCT 38.0*  MCV 91.1  PLT [id number]   Basic Metabolic Panel:  Recent Labs Lab 11/08/16 1411  NA 128*    K 3.3*  CL 89*  CO2 24  GLUCOSE 130*  BUN 36*  CREATININE 1.36*  CALCIUM 9.4   GFR: CrCl cannot be calculated (Unknown ideal weight.). Liver Function Tests:  Recent Labs Lab 11/08/16 1411  AST 17  ALT 11*  ALKPHOS 76  BILITOT 2.1*  PROT 7.7  ALBUMIN 4.2    Recent Labs Lab 11/08/16 1426  LIPASE 25   No results for input(s): AMMONIA in the last 168 hours. Coagulation Profile: No results for input(s): INR, PROTIME in the last 168 hours. Cardiac Enzymes: No results for input(s): CKTOTAL, CKMB, CKMBINDEX, TROPONINI in the last 168 hours. BNP (last 3 results) No results for input(s): PROBNP in the last 8760 hours. HbA1C: No results for input(s): HGBA1C in the last 72 hours.  CBG:  Recent Labs Lab 11/08/16 1328  GLUCAP 139*   Lipid Profile: No results for input(s): CHOL, HDL, LDLCALC, TRIG, CHOLHDL, LDLDIRECT in the last 72 hours. Thyroid Function Tests: No results for input(s): TSH, T4TOTAL, FREET4, T3FREE, THYROIDAB in the last 72 hours. Anemia Panel: No results for input(s): VITAMINB12, FOLATE, FERRITIN, TIBC, IRON, RETICCTPCT in the last 72 hours. Urine analysis:    Component Value Date/Time   COLORURINE ORANGE (A) 11/08/2016 1400   APPEARANCEUR TURBID (A) 11/08/2016 1400   LABSPEC 1.030 11/08/2016 1400   PHURINE 6.0 11/08/2016 1400   GLUCOSEU 100 (A) 11/08/2016 1400   HGBUR NEGATIVE 11/08/2016 1400   BILIRUBINUR MODERATE (A) 11/08/2016 1400   BILIRUBINUR NEG 05/27/2013 1516   KETONESUR NEGATIVE 11/08/2016 1400   PROTEINUR 30 (A) 11/08/2016 1400   UROBILINOGEN 0.2 05/27/2013 1516   NITRITE POSITIVE (A) 11/08/2016 1400   LEUKOCYTESUR SMALL (A) 11/08/2016 1400    Creatinine Clearance: CrCl cannot be calculated (Unknown ideal weight.).  Sepsis Labs: @LABRCNTIP (procalcitonin:4,lacticidven:4) )No results found for this or any previous visit (from the past 240 hour(s)).   Radiological Exams on Admission: Dg Abdomen 1 View  Result Date:  11/08/2016 CLINICAL DATA:  Constipation over the last month. EXAM: ABDOMEN - 1 VIEW COMPARISON:  None. FINDINGS: Gas pattern does not suggest ileus or obstruction. Amount of fecal matter in the colon is within normal limits with the exception of slightly prominent stool in the rectal vault. No significant soft tissue calcifications. Some vascular calcification noted. Chronic degenerative changes affect the spine. IMPRESSION: No markedly abnormal findings. Overall amount of fecal matter appears within normal limits. Slightly prominent stool in the rectal vault. Electronically Signed   By: Nelson Chimes M.D.   On: 11/08/2016 14:29   Ct Head Wo Contrast  Result Date: 11/08/2016 CLINICAL DATA:  Generalized weakness. Poor historian. Difficulty raising right arm. EXAM: CT HEAD WITHOUT CONTRAST TECHNIQUE: Contiguous axial images were obtained from the base of the skull through the vertex without intravenous contrast. COMPARISON:  None. FINDINGS: Brain: Ventricles and cisterns are within normal. There is minimal prominence of the CSF spaces compatible with age related atrophy. There is no mass, mass effect, shift of midline structures or acute hemorrhage. No evidence of acute infarction. Vascular: Minimal calcified plaque over the cavernous segment of the internal carotid arteries. Skull: Within normal. Sinuses/Orbits: Within normal. IMPRESSION: No acute intracranial findings. Electronically Signed   By: Marin Olp M.D.   On: 11/08/2016 15:56   Ct Abdomen Pelvis W Contrast  Result Date: 11/08/2016 CLINICAL DATA:  Initial evaluation for weakness, weight loss, constipation. EXAM: CT ABDOMEN AND PELVIS WITH CONTRAST TECHNIQUE: Multidetector CT imaging of the abdomen and pelvis was performed using the standard protocol following bolus administration of intravenous contrast. CONTRAST:  124mL ISOVUE-300 IOPAMIDOL (ISOVUE-300) INJECTION 61% COMPARISON:  Prior radiograph from earlier the same day. FINDINGS: Lower  chest: Mild scattered atelectatic changes present within the visualized lung bases. Visualized lungs are otherwise clear. No pleural or pericardial fusion. Hepatobiliary: Liver demonstrates a normal contrast enhanced appearance. Gallbladder within normal limits. No biliary dilatation. Pancreas: Pancreas within normal limits. Spleen: Scattered hypodense lesions noted within the spleen, largest of which measures 9 mm. These are indeterminate, but are of doubtful clinical significance. Spleen otherwise unremarkable. Adrenals/Urinary Tract: 2.3 cm hypodense nodule noted within the right adrenal gland, most likely a small adenoma. Left adrenal gland within normal limits. Kidneys equal in size with symmetric enhancement. Scattered parapelvic cyst noted within the left kidney. Few scattered calcifications within  the left renal hilum suspicious for small nonobstructive calculi. These measure up to 3 mm. No hydronephrosis. No focal enhancing renal mass. Scattered cortical thinning/scarring noted. Ureters are of normal caliber without acute abnormality. Bladder within normal limits. Stomach/Bowel: Small hiatal hernia noted. Stomach otherwise unremarkable. Small bowel within normal limits for caliber without evidence for obstruction. Colonic diverticulosis without evidence for acute diverticulitis. Appendix not visualize, compatible with history of prior appendectomy. No abnormal wall thickening, mucosal enhancement, or inflammatory fat stranding seen about the bowels. Large amount of retained stool within the rectal vault, suggesting constipation. Vascular/Lymphatic: Normal intravascular enhancement seen throughout the intra-abdominal aorta and its branch vessels. Moderate aorto bi-iliac atherosclerotic disease. No aneurysm. No pathologically enlarged intra-abdominal or pelvic lymph nodes. Reproductive: Prostate within normal limits. Other: No free air or fluid. Small bilateral fat containing inguinal hernias noted.  Musculoskeletal: No acute osseous abnormality. No worrisome lytic or blastic osseous lesions. Moderate multilevel degenerative spondylolysis noted within the visualized spine. IMPRESSION: 1. No CT evidence for acute intra-abdominal or pelvic process. 2. Large amount of retained stool within the rectal vault, suggesting constipation. 3. Sigmoid diverticulosis without evidence for acute diverticulitis. 4. Question punctate nonobstructive left renal nephrolithiasis. Electronically Signed   By: Jeannine Boga M.D.   On: 11/08/2016 18:08    EKG: Independently reviewed. Atrial flutter with rate 132; nonspecific ST changes with no evidence of acute ischemia  Assessment/Plan Principal Problem:   Constipation Active Problems:   Essential hypertension, benign   Atrial fibrillation (H[facility])   Diabetes mellitus type 2 in obese (HCC)   Hyponatremia   UTI (urinary tract infection)   Hypokalemia   CKD (chronic kidney disease) stage 3, GFR 30-59 ml/min   Hyperbilirubinemia   Homelessness   Constipation -Patient has long-standing report of constipation -Received attempt at manual disimpaction by ER PA (unsuccessful) -Also received enema in ER (unsuccessful while I was present) -Will continue aggressive treatment of constipation, as this is very uncomfortable for the patient  HTN -BP has actually been low during his stay thus far -Patient reports not taking medication for several years -Would likely benefit from addition of ACE-I if able to tolerate  Afib -Intermittent rate control, but suspect that this is multifactorial and related to other issues going on today -For now will follow -If sustained tachycardia, consider IV Diltiazem x 1  -Previously on Eliquis -CHA2DS2-VASc score is 3, with a 3.2% stroke risk per year -If patient remains homeless, anticoagulation is not reasonable -If he is placed, consider resumption of anticoagulant (Coumadin is far more cost-effective, and if he can be  managed in the SNF with Coumadin, that may be preferred)  DM -No medications for several years -Will check A1c -Mild hyperglycemia for now -Treat with SSI  Hyponatremia -mild to moderate -Likely hypovolemic from inadequate PO intake (although interestingly his albumin is normal; this was unexpected) -Will gently hydrate and follow  UTI -Reports dysuria -UA is abnormal  -Given Rocephin x 1 in ER, will continue  Hypokalemia -Mild, K+ 3.3 -Will add KCl to IVF and follow  CKD -Prior creatinine in 2015 was lower, but patient has not been taking medications for 2 years -Suspect that elevation to 1.36 is primarily CKD -There may also be an AKI component -Will follow with IVF  Hyperbilirubinemia -Uncertain etiology -Bilirubin is 2.1 -Consider RUQ [place] or other testing for further evaluation -Patient denies h/o ETOH or drugs, but will test for both  Homelessness -SW consult for possible placement   DVT prophylaxis:  Lovenox  Code  Status: Full - confirmed with patient Family Communication: None present Disposition Plan:  To be determined Consults called: SW Admission status: Admit - It is my clinical opinion that admission to INPATIENT is reasonable and necessary because this patient will require at least 2 midnights in the hospital to treat this condition based on the medical complexity of the problems presented.  Given the aforementioned information, the predictability of an adverse outcome is felt to be significant.     Karmen Bongo MD [facility]  If 7PM-7AM, please contact night-coverage [email or web address] Password [email or web address]  11/09/2016, 1:57 AM

## 2016-11-08 NOTE — ED Notes (Signed)
Stuck pt twice was unsuccessful. [name] made aware.

## 2016-11-08 NOTE — Congregational Nurse Program (Signed)
Congregational Nurse Program Note  Date of Encounter: 11/08/2016  Past Medical History: Past Medical History:  Diagnosis Date  . Atrial fibrillation   . Cataract   . Diabetes mellitus without complication   . Hyperlipidemia   . Hypertension   . Lichen planus    bilateral legs    Encounter Details:     CNP Questionnaire - 11/08/16 1030      Patient Demographics   Is this a new or existing patient? New   Patient is considered a/an Not Applicable   Race Caucasian/White     Patient Assistance   Location of Patient Assistance [facility]   Patient's financial/insurance status Medicare   Uninsured Patient ([facility]) No   Patient referred to apply for the following financial assistance Not Applicable   Food insecurities addressed Not Applicable   Transportation assistance Yes   Type of Assistance Taxi Voucher Given   Assistance securing medications No   Educational health offerings Not Applicable     Encounter Details   Primary purpose of visit Other   Was an Emergency Department visit averted? No   Does patient have a medical provider? Yes   Patient referred to Emergency Department   Was a mental health screening completed? (GAINS tool) No   Does patient have dental issues? No   Does patient have vision issues? No   Does your patient have an abnormal blood pressure today? Yes   Since previous encounter, have you referred patient for abnormal blood pressure that resulted in a new diagnosis or medication change? No   Does your patient have an abnormal blood glucose today? No   Since previous encounter, have you referred patient for abnormal blood glucose that resulted in a new diagnosis or medication change? No   Was there a life-saving intervention made? No     New client- states that he is recently homeless from living in hotel. Reports that he has not had a "normal" bowel movement in several weeks. Reports that he hasn't been able to eat except water in several  days. Reports poor nutrition for several months along with extreme weight loss. Last saw Dr Nancy Fetter at [facility] in October. Reports history of atrial fibrillation but is currently not taking medication. Also reports history of depression and was previously treated at [facility] in 2016. Not taking any medications at present. BP was difficult to auscultate in office. Skin very dry, cool with weak pulses. HR 108. Client agrees to take taxi to [facility] ED. CN will meet client in ED to advocate.

## 2016-11-08 NOTE — ED Notes (Addendum)
[facility]. Pt states he has rash in his groin from infrequent showering. The area is open and has been bleeding. Pt states he has not urinated since yesterday, and his urine was "very dark" Pt does not seem to have a distended bladder. Pt states he has a history of DM. Pt states he has not been eating or drinking because he does not want to make his constipation worse. Pt A/O Pt has a urine retention of 352 through use of the bladder scanner

## 2016-11-08 NOTE — ED Notes (Signed)
Unable to collect labs at this time. 

## 2016-11-08 NOTE — ED Notes (Signed)
RN is starting an IV

## 2016-11-08 NOTE — ED Provider Notes (Signed)
[facility] DEPT Provider Note   CSN: [id number] Arrival date & time: 11/08/16  1227     History   Chief Complaint Chief Complaint  Patient presents with  . Constipation  . Weakness    HPI [name] is a 65 y.o. male who presents with multiple complaints. PMH significant for A. Fib not on anticoagulation, DM Type 2, HTN, HLD, depression. He is homeless and resides currently at [facility] in the lobby. He has been off all his medications due to financial reasons.   His first and main complaint is constipation. His LBM was 5 weeks ago and then 3 weeks before that. He reports frequent straining and rectal pain which feels like burning. He is passing gas frequently. No abdominal pain. Past surgical hx significant for appendectomy. No history of cancer. Additionally he reports decreased PO intake because he has pain after he eats which he attributes to constipation. This goes away and is not present currently. The decreased PO intake has led to generalized weakness and unintentional weight loss. He states he has lost 100 pounds over the past several months. What has brought him in today was when he went to see the RN at [facility] and she noted he was tachycardic and hypotensive with weak pulses and felt cold.   Other complaints are right arm weakness, a rash on his groin and rectal area, and difficulty urinating with dark urine. The right arm weakness has been going on for several months. It is only in the arm and not the leg. The rash has been present for several weeks as well. He attributes this to his poor hygiene. The urinary retention he also attributes to constipation. He states the pressure has caused difficulty urinating.   Denies fever, chills, HA, chest pain, SOB, cough, abdominal pain, nausea, diarrhea.  HPI  Past Medical History:  Diagnosis Date  . Atrial fibrillation ([place])   . Cataract   . Diabetes mellitus without complication ([place])   . Hyperlipidemia     . Hypertension   . Lichen planus    bilateral legs    Patient Active Problem List   Diagnosis Date Noted  . RBBB 09/13/2013  . Snoring 09/13/2013  . Edema 09/07/2013  . Diabetes mellitus type 2 in obese ([place]) 03/16/2013  . Arthritis 03/16/2013  . Essential hypertension, benign 02/23/2013  . Morbid obesity ([place]) 02/23/2013  . Atrial fibrillation ([place]) 02/23/2013  . Other and unspecified hyperlipidemia 02/23/2013    Past Surgical History:  Procedure Laterality Date  . APPENDECTOMY    . FRACTURE SURGERY    . MULTIPLE TOOTH EXTRACTIONS       Home Medications    Prior to Admission medications   Medication Sig Start Date End Date Taking? Authorizing Provider  buPROPion (WELLBUTRIN XL) 150 MG 24 hr tablet Take 150 mg by mouth daily.    Historical Provider, MD  cyanocobalamin 500 MCG tablet Take 500 mcg by mouth daily.    Historical Provider, MD  Multiple Vitamin (MULTIVITAMIN) capsule Take 1 capsule by mouth daily.    Historical Provider, MD  Omega 3 1200 MG CAPS Take 2 capsules by mouth daily.    Historical Provider, MD  OVER THE COUNTER MEDICATION OTC Calcium w/ Vitamin D 3 taking    Historical Provider, MD  OVER THE COUNTER MEDICATION OTC Magnesium 400 mg taking    Historical Provider, MD  ranitidine (ZANTAC) 150 MG capsule Take 150 mg by mouth as needed for heartburn.    Historical Provider, MD  Family History Family History  Problem Relation Age of Onset  . Brain cancer Mother   . Alzheimer's disease Father   . Cancer Maternal Grandmother   . Heart attack Maternal Grandfather   . Pneumonia Paternal Grandfather     Social History Social History  Substance Use Topics  . Smoking status: Former Smoker    Types: Pipe    Quit date: 09/14/1983  . Smokeless tobacco: Never Used  . Alcohol use No     Allergies   Other   Review of Systems Review of Systems  Constitutional: Positive for appetite change, fatigue and unexpected weight change. Negative for chills  and fever.  Eyes: Negative for visual disturbance.  Respiratory: Negative for cough and shortness of breath.   Cardiovascular: Negative for chest pain.  Gastrointestinal: Positive for constipation, nausea and rectal pain. Negative for abdominal pain, anal bleeding, blood in stool, diarrhea and vomiting.  Genitourinary: Positive for decreased urine volume, difficulty urinating and testicular pain. Negative for discharge, dysuria, enuresis, flank pain and frequency.  Musculoskeletal: Positive for gait problem.  Skin: Positive for rash. Negative for wound.  Allergic/Immunologic: Positive for immunocompromised state (Diabetic).  Neurological: Positive for weakness. Negative for syncope, light-headedness, numbness and headaches.     Physical Exam Updated Vital Signs BP 93/60 (BP Location: Left Arm)   Pulse (!) 135   Temp 97.6 F (36.4 C) (Oral)   Resp 18   SpO2 100%   Physical Exam  Constitutional: He is oriented to person, place, and time. No distress.  Chronically ill appearing. Talkative  HENT:  Head: Normocephalic and atraumatic.  Eyes: Conjunctivae are normal. Pupils are equal, round, and reactive to light. Right eye exhibits no discharge. Left eye exhibits no discharge. No scleral icterus.  Neck: Normal range of motion.  Cardiovascular: Regular rhythm.  Tachycardia present.  Exam reveals decreased pulses. Exam reveals no gallop and no friction rub.   No murmur heard. Pulmonary/Chest: Effort normal and breath sounds normal. No respiratory distress. He has no wheezes. He has no rales. He exhibits no tenderness.  Abdominal: Soft. Bowel sounds are normal. He exhibits no distension and no mass. There is tenderness. There is no rebound and no guarding. No hernia.  Generalized tenderness. Skin breakdown around umbilicus  Genitourinary:  Genitourinary Comments: Erythematous rash which is tender to palpation in bilateral inguinal folds and on testicular area consistent with tinea cruris.    Musculoskeletal: He exhibits edema.  Bilateral lower leg edema  Neurological: He is alert and oriented to person, place, and time.  Mental Status:  Alert, oriented, thought content appropriate, able to give a coherent history. Speech fluent without evidence of aphasia. Able to follow 2 step commands without difficulty.  Cranial Nerves:  II:  Peripheral visual fields grossly normal, pupils equal, round, reactive to light III,IV, VI: ptosis not present, extra-ocular motions intact bilaterally  V,VII: smile symmetric, facial light touch sensation equal VIII: hearing grossly normal to voice  X: uvula elevates symmetrically  XI: bilateral shoulder shrug symmetric and strong XII: midline tongue extension without fassiculations Motor:  Normal tone. 5/5 in upper and lower extremities on the elft side including strong and equal grip strength and dorsiflexion/plantar flexion. 4/5 in right upper extremity. 5/5 in right lower extremity. Sensory: Pinprick and light touch normal in all extremities.  Cerebellar: normal finger-to-nose with bilateral upper extremities Gait: Not assessed CV: distal pulses are palpable but diminished throughout    Skin: Skin is warm and dry.  Flaky skin on lower legs  Psychiatric: He has a normal mood and affect. His behavior is normal.  Nursing note and vitals reviewed.    ED Treatments / Results  Labs (all labs ordered are listed, but only abnormal results are displayed) Labs Reviewed  BASIC METABOLIC PANEL - Abnormal; Notable for the following:       Result Value   Sodium 128 (*)    Potassium 3.3 (*)    Chloride 89 (*)    Glucose, Bld 130 (*)    BUN 36 (*)    Creatinine, Ser 1.36 (*)    GFR calc non Af Amer 53 (*)    All other components within normal limits  CBC - Abnormal; Notable for the following:    RBC 4.17 (*)    HCT 38.0 (*)    All other components within normal limits  URINALYSIS, ROUTINE W REFLEX MICROSCOPIC (NOT AT [facility]) - Abnormal; Notable  for the following:    Color, Urine ORANGE (*)    APPearance TURBID (*)    Glucose, UA 100 (*)    Bilirubin Urine MODERATE (*)    Protein, ur 30 (*)    Nitrite POSITIVE (*)    Leukocytes, UA SMALL (*)    All other components within normal limits  HEPATIC FUNCTION PANEL - Abnormal; Notable for the following:    ALT 11 (*)    Total Bilirubin 2.1 (*)    Bilirubin, Direct 0.6 (*)    Indirect Bilirubin 1.5 (*)    All other components within normal limits  URINE MICROSCOPIC-ADD ON - Abnormal; Notable for the following:    Bacteria, UA FEW (*)    All other components within normal limits  CBG MONITORING, ED - Abnormal; Notable for the following:    Glucose-Capillary 139 (*)    All other components within normal limits  LIPASE, BLOOD  DIFFERENTIAL  I-STAT TROPOININ, ED  POC OCCULT BLOOD, ED    EKG  EKG Interpretation  Date/Time:  Monday November 08 2016 13:13:22 EST Ventricular Rate:  132 PR Interval:    QRS Duration: 131 QT Interval:  415 QTC Calculation: 616 R Axis:   -91 Text Interpretation:  Sinus tachycardia RBBB and LAFB No STEMI. No old tracing for comparison.  Confirmed by LONG MD, JOSHUA [phone]) on 11/08/2016 1:21:32 PM Also confirmed by LONG MD, JOSHUA [phone]), editor Stout CT, Leda Gauze [phone])  on 11/08/2016 1:22:17 PM       Radiology Dg Abdomen 1 View  Result Date: 11/08/2016 CLINICAL DATA:  Constipation over the last month. EXAM: ABDOMEN - 1 VIEW COMPARISON:  None. FINDINGS: Gas pattern does not suggest ileus or obstruction. Amount of fecal matter in the colon is within normal limits with the exception of slightly prominent stool in the rectal vault. No significant soft tissue calcifications. Some vascular calcification noted. Chronic degenerative changes affect the spine. IMPRESSION: No markedly abnormal findings. Overall amount of fecal matter appears within normal limits. Slightly prominent stool in the rectal vault. Electronically Signed   By: Nelson Chimes M.D.    On: 11/08/2016 14:29   Ct Head Wo Contrast  Result Date: 11/08/2016 CLINICAL DATA:  Generalized weakness. Poor historian. Difficulty raising right arm. EXAM: CT HEAD WITHOUT CONTRAST TECHNIQUE: Contiguous axial images were obtained from the base of the skull through the vertex without intravenous contrast. COMPARISON:  None. FINDINGS: Brain: Ventricles and cisterns are within normal. There is minimal prominence of the CSF spaces compatible with age related atrophy. There is no mass, mass effect, shift of midline structures  or acute hemorrhage. No evidence of acute infarction. Vascular: Minimal calcified plaque over the cavernous segment of the internal carotid arteries. Skull: Within normal. Sinuses/Orbits: Within normal. IMPRESSION: No acute intracranial findings. Electronically Signed   By: Marin Olp M.D.   On: 11/08/2016 15:56   Ct Abdomen Pelvis W Contrast  Result Date: 11/08/2016 CLINICAL DATA:  Initial evaluation for weakness, weight loss, constipation. EXAM: CT ABDOMEN AND PELVIS WITH CONTRAST TECHNIQUE: Multidetector CT imaging of the abdomen and pelvis was performed using the standard protocol following bolus administration of intravenous contrast. CONTRAST:  12mL ISOVUE-300 IOPAMIDOL (ISOVUE-300) INJECTION 61% COMPARISON:  Prior radiograph from earlier the same day. FINDINGS: Lower chest: Mild scattered atelectatic changes present within the visualized lung bases. Visualized lungs are otherwise clear. No pleural or pericardial fusion. Hepatobiliary: Liver demonstrates a normal contrast enhanced appearance. Gallbladder within normal limits. No biliary dilatation. Pancreas: Pancreas within normal limits. Spleen: Scattered hypodense lesions noted within the spleen, largest of which measures 9 mm. These are indeterminate, but are of doubtful clinical significance. Spleen otherwise unremarkable. Adrenals/Urinary Tract: 2.3 cm hypodense nodule noted within the right adrenal gland, most likely a  small adenoma. Left adrenal gland within normal limits. Kidneys equal in size with symmetric enhancement. Scattered parapelvic cyst noted within the left kidney. Few scattered calcifications within the left renal hilum suspicious for small nonobstructive calculi. These measure up to 3 mm. No hydronephrosis. No focal enhancing renal mass. Scattered cortical thinning/scarring noted. Ureters are of normal caliber without acute abnormality. Bladder within normal limits. Stomach/Bowel: Small hiatal hernia noted. Stomach otherwise unremarkable. Small bowel within normal limits for caliber without evidence for obstruction. Colonic diverticulosis without evidence for acute diverticulitis. Appendix not visualize, compatible with history of prior appendectomy. No abnormal wall thickening, mucosal enhancement, or inflammatory fat stranding seen about the bowels. Large amount of retained stool within the rectal vault, suggesting constipation. Vascular/Lymphatic: Normal intravascular enhancement seen throughout the intra-abdominal aorta and its branch vessels. Moderate aorto bi-iliac atherosclerotic disease. No aneurysm. No pathologically enlarged intra-abdominal or pelvic lymph nodes. Reproductive: Prostate within normal limits. Other: No free air or fluid. Small bilateral fat containing inguinal hernias noted. Musculoskeletal: No acute osseous abnormality. No worrisome lytic or blastic osseous lesions. Moderate multilevel degenerative spondylolysis noted within the visualized spine. IMPRESSION: 1. No CT evidence for acute intra-abdominal or pelvic process. 2. Large amount of retained stool within the rectal vault, suggesting constipation. 3. Sigmoid diverticulosis without evidence for acute diverticulitis. 4. Question punctate nonobstructive left renal nephrolithiasis. Electronically Signed   By: Jeannine Boga M.D.   On: 11/08/2016 18:08    Procedures Procedures (including critical care time)  Medications Ordered  in ED Medications  sodium chloride 0.9 % bolus 1,000 mL (0 mLs Intravenous Stopped 11/08/16 1547)  LORazepam (ATIVAN) tablet 1 mg (1 mg Oral Given 11/08/16 1442)  HYDROcodone-acetaminophen (NORCO/VICODIN) 5-325 MG per tablet 1 tablet (1 tablet Oral Given 11/08/16 1443)  nystatin (MYCOSTATIN/NYSTOP) topical powder ( Topical Given 11/08/16 1443)  metoprolol (LOPRESSOR) injection 5 mg (5 mg Intravenous Given 11/08/16 1754)  iopamidol (ISOVUE-300) 61 % injection 100 mL (100 mLs Intravenous Contrast Given 11/08/16 1719)  sodium chloride 0.9 % bolus 1,000 mL (0 mLs Intravenous Stopped 11/08/16 1857)  sorbitol, milk of mag, mineral oil, glycerin (SMOG) enema (960 mLs Rectal Given 11/08/16 1900)  cefTRIAXone (ROCEPHIN) 2 g in dextrose 5 % 50 mL IVPB (2 g Intravenous New Bag/Given 11/08/16 2100)     Initial Impression / Assessment and Plan / ED Course  I have  reviewed the triage vital signs and the nursing notes.  Pertinent labs & imaging results that were available during my care of the patient were reviewed by me and considered in my medical decision making (see chart for details).  Clinical Course    65 year old male presents with symptomatic hyponatremia. He is tachycardic and hypotensive with lowest BP of 93/60. 2L IVF given with some improvement in BP however he is persistent tachycardic. Metoprolol given which temporarily improved HR. CT head negative. CBC unremarkable. CMP remarkable for hyponatremia (128), hypokalemia (3.3), hypochloremia (89), SCr of 1.36 up from 1.1 2 years ago, glucose 130 - likely improved from significant weight loss, bilirubin 2.1. Lipase normal. Troponin is 0.01.   Constipation - CT A&P remarkable for constipation without evidence of obstruction. Fecal disimpaction attempted without success. SMOG enema given with mild success. He will likely need a bowel regimen.  Hyponatremia - 2 L IVF given UTI - Rocephin given.   Due to patient multiple medical problems with poor  follow up and profound weakness will ask hospitalist to admit for further management. Spoke with Dr. Lorin Mercy who will admit patient. Appreciate assistance very much.  Final Clinical Impressions(s) / ED Diagnoses   Final diagnoses:  Hyponatremia  Urinary tract infection without hematuria, site unspecified  Generalized weakness  Constipation, unspecified constipation type    New Prescriptions Current Discharge Medication List       Recardo Evangelist, PA-C 11/08/16 2133    Margette Fast, MD 11/09/16 [phone]

## 2016-11-08 NOTE — ED Notes (Signed)
Patient aware we need urine. Patient states "I can only urinate when I stand up and push on my stomach until something comes out." I explained to patient that we are giving him IV fluids, but if it has been awhile, we may need to In and out cath him. Patient asks "that goes in the urethra?" I replied, "yes,sir. It does go into the urethra and into the bladder,but comes right back out." Patient states "I do not want that done."

## 2016-11-08 NOTE — Progress Notes (Signed)
CSW spoke with patient regarding discharging plans. CSW explained that we are unable to find placement from the emergency room. CSW provided patient with a list of assisted living facilities and group homes. Patient currently has PCP and stated he would share list with PCP and social worker at [facility].   Kingsley Spittle, LCSWA Clinical Social Worker [phone]

## 2016-11-09 DIAGNOSIS — K5909 Other constipation: Secondary | ICD-10-CM

## 2016-11-09 DIAGNOSIS — N183 Chronic kidney disease, stage 3 unspecified: Secondary | ICD-10-CM | POA: Diagnosis present

## 2016-11-09 DIAGNOSIS — Z5900 Homelessness unspecified: Secondary | ICD-10-CM

## 2016-11-09 DIAGNOSIS — K5900 Constipation, unspecified: Secondary | ICD-10-CM | POA: Diagnosis present

## 2016-11-09 DIAGNOSIS — I10 Essential (primary) hypertension: Secondary | ICD-10-CM | POA: Diagnosis not present

## 2016-11-09 DIAGNOSIS — N390 Urinary tract infection, site not specified: Secondary | ICD-10-CM | POA: Diagnosis present

## 2016-11-09 DIAGNOSIS — E876 Hypokalemia: Secondary | ICD-10-CM | POA: Diagnosis present

## 2016-11-09 HISTORY — DX: Other constipation: K59.09

## 2016-11-09 LAB — ETHANOL

## 2016-11-09 LAB — BASIC METABOLIC PANEL
ANION GAP: 12 (ref 5–15)
BUN: 31 mg/dL — ABNORMAL HIGH (ref 6–20)
CALCIUM: 8.4 mg/dL — AB (ref 8.9–10.3)
CHLORIDE: 96 mmol/L — AB (ref 101–111)
CO2: 23 mmol/L (ref 22–32)
CREATININE: 1.04 mg/dL (ref 0.61–1.24)
GFR calc non Af Amer: >60 mL/min (ref >60)
Glucose, Bld: 122 mg/dL — ABNORMAL HIGH (ref 65–99)
Potassium: 3.3 mmol/L — ABNORMAL LOW (ref 3.5–5.1)
SODIUM: 131 mmol/L — AB (ref 135–145)

## 2016-11-09 LAB — RAPID URINE DRUG SCREEN, HOSP PERFORMED
AMPHETAMINES: NOT DETECTED
BENZODIAZEPINES: NOT DETECTED
Barbiturates: NOT DETECTED
COCAINE: NOT DETECTED
Opiates: NOT DETECTED
Tetrahydrocannabinol: NOT DETECTED

## 2016-11-09 LAB — CBC
HCT: 35.5 % — ABNORMAL LOW (ref 39.0–52.0)
HEMOGLOBIN: 12.9 g/dL — AB (ref 13.0–17.0)
MCH: 33.3 pg (ref 26.0–34.0)
MCHC: 36.3 g/dL — ABNORMAL HIGH (ref 30.0–36.0)
MCV: 91.7 fL (ref 78.0–100.0)
PLATELETS: 134 10*3/uL — AB (ref 150–400)
RBC: 3.87 MIL/uL — AB (ref 4.22–5.81)
RDW: 13.6 % (ref 11.5–15.5)
WBC: 6 10*3/uL (ref 4.0–10.5)

## 2016-11-09 LAB — CBG MONITORING, ED
GLUCOSE-CAPILLARY: 149 mg/dL — AB (ref 65–99)
Glucose-Capillary: 111 mg/dL — ABNORMAL HIGH (ref 65–99)

## 2016-11-09 LAB — I-STAT CG4 LACTIC ACID, ED: Lactic Acid, Venous: 1.4 mmol/L (ref 0.5–1.9)

## 2016-11-09 LAB — GLUCOSE, CAPILLARY
GLUCOSE-CAPILLARY: 145 mg/dL — AB (ref 65–99)
Glucose-Capillary: 134 mg/dL — ABNORMAL HIGH (ref 65–99)

## 2016-11-09 LAB — MAGNESIUM: Magnesium: 1.7 mg/dL (ref 1.7–2.4)

## 2016-11-09 MED ORDER — LABETALOL HCL 5 MG/ML IV SOLN
5.0000 mg | INTRAVENOUS | Status: DC | PRN
  Administered 2016-11-09: 5 mg via INTRAVENOUS
  Filled 2016-11-09 (×3): qty 4

## 2016-11-09 MED ORDER — DEXTROSE 5 % IV SOLN
2.0000 g | INTRAVENOUS | Status: DC
  Administered 2016-11-09: 2 g via INTRAVENOUS
  Filled 2016-11-09 (×2): qty 2

## 2016-11-09 MED ORDER — METOPROLOL TARTRATE 25 MG PO TABS
12.5000 mg | ORAL_TABLET | Freq: Two times a day (BID) | ORAL | Status: DC
  Administered 2016-11-09 – 2016-11-10 (×2): 12.5 mg via ORAL
  Filled 2016-11-09 (×5): qty 1

## 2016-11-09 MED ORDER — ENOXAPARIN SODIUM 100 MG/ML ~~LOC~~ SOLN
1.0000 mg/kg | Freq: Two times a day (BID) | SUBCUTANEOUS | Status: DC
  Administered 2016-11-10 – 2016-11-14 (×10): 95 mg via SUBCUTANEOUS
  Filled 2016-11-09 (×10): qty 1

## 2016-11-09 MED ORDER — SODIUM CHLORIDE 0.9 % IV BOLUS (SEPSIS)
250.0000 mL | Freq: Once | INTRAVENOUS | Status: AC
  Administered 2016-11-09: 250 mL via INTRAVENOUS

## 2016-11-09 MED ORDER — LORAZEPAM 0.5 MG PO TABS
0.5000 mg | ORAL_TABLET | Freq: Once | ORAL | Status: AC
  Administered 2016-11-09: 0.5 mg via ORAL
  Filled 2016-11-09: qty 1

## 2016-11-09 MED ORDER — HYDROCORTISONE 2.5 % RE CREA
TOPICAL_CREAM | Freq: Two times a day (BID) | RECTAL | Status: DC
  Administered 2016-11-09 – 2016-11-17 (×12): via RECTAL
  Administered 2016-11-18 – 2016-11-19 (×2): 1 via RECTAL
  Administered 2016-11-19 – 2016-11-25 (×9): via RECTAL
  Filled 2016-11-09 (×3): qty 28.35

## 2016-11-09 MED ORDER — LIDOCAINE 4 % EX CREA
TOPICAL_CREAM | Freq: Every day | CUTANEOUS | Status: DC | PRN
  Filled 2016-11-09: qty 5

## 2016-11-09 MED ORDER — POTASSIUM CHLORIDE CRYS ER 20 MEQ PO TBCR: 40.0000 meq | EXTENDED_RELEASE_TABLET | Freq: Once | ORAL | Status: DC

## 2016-11-09 MED ORDER — ENOXAPARIN SODIUM 100 MG/ML ~~LOC~~ SOLN
1.0000 mg/kg | SUBCUTANEOUS | Status: AC
  Administered 2016-11-09: 95 mg via SUBCUTANEOUS
  Filled 2016-11-09: qty 1

## 2016-11-09 MED ORDER — LIDOCAINE 5 % EX OINT
TOPICAL_OINTMENT | Freq: Every day | CUTANEOUS | Status: DC | PRN
  Administered 2016-11-09 – 2016-11-12 (×2): via TOPICAL
  Filled 2016-11-09 (×3): qty 35.44

## 2016-11-09 MED ORDER — MAGNESIUM SULFATE 2 GM/50ML IV SOLN
2.0000 g | Freq: Once | INTRAVENOUS | Status: AC
  Administered 2016-11-09: 2 g via INTRAVENOUS
  Filled 2016-11-09: qty 50

## 2016-11-09 NOTE — ED Notes (Signed)
Pt had one large episode of diarrhea.

## 2016-11-09 NOTE — ED Notes (Signed)
cbg 111

## 2016-11-09 NOTE — Progress Notes (Signed)
Staffed with Nurse  CSW attempted to speak with patient at bedside. Hospitalist in room with patient at the time.   Merry Proud, [place] Social Worker [phone] 1:07 PM

## 2016-11-09 NOTE — Progress Notes (Signed)
ANTICOAGULATION CONSULT NOTE - Initial Consult  Pharmacy Consult for Lovenox Indication: Atrial Fibrillation  Allergies  Allergen Reactions  . Other     [name] bandage (self-gripping) - erythema, itching    Patient Measurements: Height: 6\' 1"  (185.4 cm) IBW/kg (Calculated) : 79.9 Heparin Dosing Weight:   Vital Signs: Temp: 98.4 F (36.9 C) (11/28 1700) Temp Source: Oral (11/28 1148) BP: 110/77 (11/28 1700) Pulse Rate: 130 (11/28 1700)  Labs:  Recent Labs  11/08/16 1411 11/09/16 0301  HGB 13.6 12.9*  HCT 38.0* 35.5*  PLT 163 134*  CREATININE 1.36* 1.04    CrCl cannot be calculated (Unknown ideal weight.).   Medical History: Past Medical History:  Diagnosis Date  . Atrial fibrillation ([place])   . Cataract   . Diabetes mellitus without complication ([place])   . Hyperlipidemia   . Hypertension   . Lichen planus    bilateral legs    Assessment: 62 yoM with PMHx significant for Afib not on anticoagulation, T2DM, HTN, HLD, and depression, homeless and noncompliant with medications for several years presents 11/27 with constipation and found to have hyponatremia, hypotension, and tachycardia.  Pt currently in Afib with [facility]VR and pharmacy consulted to start full dose lovenox.    11/28:  SCr = 1.04 CBC: Hgb and Plts slightly low Pt received lovenox 40mg  11/28 @ 0025 Current weight = 93.4kg  Goal of Therapy:  Anti-Xa level 0.6-1 units/ml 4hrs after LMWH dose given Monitor platelets by anticoagulation protocol: Yes   Plan:  Lovenox 1mg /kg q12h F/u renal function, CBC, signs and symptoms of bleeding F/u outpatient anticoagulation plan  Ralene Bathe, PharmD, BCPS 11/09/2016, 8:25 PM  Pager: [phone]

## 2016-11-09 NOTE — Clinical Social Work Note (Signed)
Clinical Social Work Assessment  Patient Details  Name: [name] MRN: [id number] Date of Birth: 12/17/1950  Date of referral:  11/09/16               Reason for consult:  Discharge Planning                Permission sought to share information with:  Facility Art therapist granted to share information::  Yes, Verbal Permission Granted  Name::        Agency::     Relationship::     Contact Information:     Housing/Transportation Living arrangements for the past 2 months:  Homeless Source of Information:  Patient Patient Interpreter Needed:  None Criminal Activity/Legal Involvement Pertinent to Current Situation/Hospitalization:    Significant Relationships:  None Lives with:  Self Do you feel safe going back to the place where you live?  No Need for family participation in patient care:  No (Coment)  Care giving concerns:  CSW received consult to speak with patient regarding discharge plans.    Social Worker assessment / plan:  CSW spoke with patient regarding discharge plans. Patient is currently living at [facility] in [place]. Patient stated he would like to find an assisted living facility placement before leaving the hospital. CSW provided patient with list of group homes/ assisted living facilities in [place]. Patient stated he has a PCP and social worker at [facility] who could potentially help patient with placement.   Employment status:  Unemployed Nurse, adult PT Recommendations:  Not assessed at this time Information / Referral to community resources:     Patient/Family's Response to care:  Unknown at this time.   Patient/Family's Understanding of and Emotional Response to Diagnosis, Current Treatment, and Prognosis:  Unknown at this time.  Emotional Assessment Appearance:  Appears stated age Attitude/Demeanor/Rapport:    Affect (typically observed):  Pleasant, Anxious, Appropriate Orientation:   Oriented to Self, Oriented to Place, Oriented to  Time, Oriented to Situation Alcohol / Substance use:    Psych involvement (Current and /or in the community):  No (Comment)  Discharge Needs  Concerns to be addressed:  No discharge needs identified Readmission within the last 30 days:  No Current discharge risk:  None Barriers to Discharge:  No Barriers Identified   Weston Anna, LCSW 11/09/2016, 4:30 PM

## 2016-11-09 NOTE — ED Notes (Signed)
Will give bedside report.  

## 2016-11-09 NOTE — Progress Notes (Signed)
Patient lower abdomen was distended. Bladder scan completed, 740 cc. Pt reports he has been unable to void. Will contact provider for update.

## 2016-11-09 NOTE — Plan of Care (Signed)
Problem: Safety: Goal: Ability to remain free from injury will improve Outcome: Completed/Met Date Met: 11/09/16 discussed safety prevention plan. Will set bed alarm

## 2016-11-09 NOTE — Progress Notes (Addendum)
PROGRESS NOTE    [name]  [id number] DOB: 04-12-1951 DOA: 11/08/2016 PCP: Sandi Mariscal, MD    Brief Narrative:  65 y.o. male with medical history significant ofA. Fib not on anticoagulation, DM Type 2, HTN, HLD, depression who presents initially with complaints of uncontrolled constipation. Patient admitted to being essentially noncompliant with any of his regimen for the past several years. In the emergency department, patient was noted to have symptomatically hyponatremia and tachycardia. Patient was also mildly hypotensive, receiving IV fluid hydration. Patient also found to have significant stool burden on CT abdomen pelvis. Fecal distraction attempt was without success. Smog enema yielded limited success. The patient was ultimately admitted for further management of multiple medical issues  Assessment & Plan:   Principal Problem:   Constipation Active Problems:   Essential hypertension, benign   Atrial fibrillation (HCC)   Diabetes mellitus type 2 in obese (HCC)   Hyponatremia   UTI (urinary tract infection)   Hypokalemia   CKD (chronic kidney disease) stage 3, GFR 30-59 ml/min   Hyperbilirubinemia   Homelessness  Constipation -Patient with history of chronic constipation -Failed manual disimpaction emergency department -This morning, patient with good put following trial of smog enema earlier  HTN -Mildly hypotensive initial presentation -Patient is continued on IV fluid hydration -Essentially has been untreated for several years secondary to lack of compliance  Afib with rapid ventricular rate -Previously on Eliquis -CHA2DS2-VASc score is 3, with a 3.2% stroke risk per year -If patient remains homeless, thus will have to find an ideal anticoagulant for patient prior to discharge -For now, we'll transition from prophylactically dosed Lovenox to a therapeutic dose per pharmacy -Heart rate this afternoon noted to be due to the 130s -Have added when necessary  labetalol IV. Will also start metoprolol 12.5 mg by mouth twice a day with hold parameters -If heart rate remains difficult to control, may warrant transition to Cardizem drip -Would continue to correct electrolytes  DM -Essentially has not been taking any medications for several years -Hemoglobin A1c is pending -Continue sliding scale insulin -Random glucose unremarkable  Hyponatremia -Improved with IV fluid hydration overnight -Repeat basic metabolic panel morning -Continue to encourage proper by mouth intake  UTI -Reports dysuria -UA is abnormal  -Patient was given 1 dose of Rocephin the emergency department -Urine culture ordered, pending -No leukocytosis. No fevers  Hypokalemia -Mild, K+ 3.3 at presentation -Potassium included an IV fluids -Repeat basic metabolic panel morning  CKD -Creatinine improved to 1.04 from 1.36 with IV fluids -Repeat basic metabolic panel morning  Hyperbilirubinemia -Uncertain etiology -Bilirubin is 2.1 -We'll repeat liver function tests in the morning. If LFTs remain elevated, then would consider right upper quadrant ultrasound -Urine drug screen negative, alcohol levels are negative  Homelessness -SW consult for possible placement  DVT prophylaxis: Therapeutic Lovenox Code Status: Full code Family Communication: Patient in room, family not at bedside Disposition Plan: Uncertain at this time  Consultants:     Procedures:     Antimicrobials: Anti-infectives    Start     Dose/Rate Route Frequency Ordered Stop   11/09/16 2000  cefTRIAXone (ROCEPHIN) 2 g in dextrose 5 % 50 mL IVPB     2 g 100 mL/hr over 30 Minutes Intravenous Every 24 hours 11/09/16 0312     11/08/16 2045  cefTRIAXone (ROCEPHIN) 2 g in dextrose 5 % 50 mL IVPB     2 g 100 mL/hr over 30 Minutes Intravenous  Once 11/08/16 2042 11/08/16 2229  Subjective: Complaining of loose stools today following smog enema  Objective: Vitals:   11/09/16 1530  11/09/16 1600 11/09/16 1630 11/09/16 1700  BP: 101/80 97/79 107/80 110/77  Pulse: 105 102 101 (!) 130  Resp: 13 13 15 16   Temp:    98.4 F (36.9 C)  TempSrc:      SpO2: 100% 100% 100% 100%  Height:    6\' 1"  (1.854 m)    Intake/Output Summary (Last 24 hours) at 11/09/16 1844 Last data filed at 11/09/16 1428  Gross per 24 hour  Intake             2300 ml  Output                0 ml  Net             2300 ml   There were no vitals filed for this visit.  Examination:  General exam: Appears calm and comfortable  Respiratory system: Clear to auscultation. Respiratory effort normal. Cardiovascular system: S1 & S2 heard, Tachycardic Gastrointestinal system: Abdomen is nondistended, soft and nontender. No organomegaly or masses felt. Normal bowel sounds heard. Central nervous system: Alert and oriented. No focal neurological deficits. Extremities: Symmetric 5 x 5 power. Skin: No rashes, lesions Psychiatry: Judgement and insight appear normal. Mood & affect appropriate.   Data Reviewed: I have personally reviewed following labs and imaging studies  CBC:  Recent Labs Lab 11/08/16 1411 11/09/16 0301  WBC 6.6 6.0  NEUTROABS 5.5  --   HGB 13.6 12.9*  HCT 38.0* 35.5*  MCV 91.1 91.7  PLT 163 [id number]*   Basic Metabolic Panel:  Recent Labs Lab 11/08/16 1411 11/09/16 0301  NA 128* 131*  K 3.3* 3.3*  CL 89* 96*  CO2 24 23  GLUCOSE 130* 122*  BUN 36* 31*  CREATININE 1.36* 1.04  CALCIUM 9.4 8.4*  MG  --  1.7   GFR: CrCl cannot be calculated (Unknown ideal weight.). Liver Function Tests:  Recent Labs Lab 11/08/16 1411  AST 17  ALT 11*  ALKPHOS 76  BILITOT 2.1*  PROT 7.7  ALBUMIN 4.2    Recent Labs Lab 11/08/16 1426  LIPASE 25   No results for input(s): AMMONIA in the last 168 hours. Coagulation Profile: No results for input(s): INR, PROTIME in the last 168 hours. Cardiac Enzymes: No results for input(s): CKTOTAL, CKMB, CKMBINDEX, TROPONINI in the last 168  hours. BNP (last 3 results) No results for input(s): PROBNP in the last 8760 hours. HbA1C: No results for input(s): HGBA1C in the last 72 hours. CBG:  Recent Labs Lab 11/08/16 1328 11/09/16 0808 11/09/16 1227 11/09/16 1812  GLUCAP 139* 111* 149* 145*   Lipid Profile: No results for input(s): CHOL, HDL, LDLCALC, TRIG, CHOLHDL, LDLDIRECT in the last 72 hours. Thyroid Function Tests: No results for input(s): TSH, T4TOTAL, FREET4, T3FREE, THYROIDAB in the last 72 hours. Anemia Panel: No results for input(s): VITAMINB12, FOLATE, FERRITIN, TIBC, IRON, RETICCTPCT in the last 72 hours. Sepsis Labs:  Recent Labs Lab 11/08/16 2239 11/09/16 0244  LATICACIDVEN 3.63* 1.40    No results found for this or any previous visit (from the past 240 hour(s)).   Radiology Studies: Dg Abdomen 1 View  Result Date: 11/08/2016 CLINICAL DATA:  Constipation over the last month. EXAM: ABDOMEN - 1 VIEW COMPARISON:  None. FINDINGS: Gas pattern does not suggest ileus or obstruction. Amount of fecal matter in the colon is within normal limits with the exception of slightly prominent stool in  the rectal vault. No significant soft tissue calcifications. Some vascular calcification noted. Chronic degenerative changes affect the spine. IMPRESSION: No markedly abnormal findings. Overall amount of fecal matter appears within normal limits. Slightly prominent stool in the rectal vault. Electronically Signed   By: Nelson Chimes M.D.   On: 11/08/2016 14:29   Ct Head Wo Contrast  Result Date: 11/08/2016 CLINICAL DATA:  Generalized weakness. Poor historian. Difficulty raising right arm. EXAM: CT HEAD WITHOUT CONTRAST TECHNIQUE: Contiguous axial images were obtained from the base of the skull through the vertex without intravenous contrast. COMPARISON:  None. FINDINGS: Brain: Ventricles and cisterns are within normal. There is minimal prominence of the CSF spaces compatible with age related atrophy. There is no mass, mass  effect, shift of midline structures or acute hemorrhage. No evidence of acute infarction. Vascular: Minimal calcified plaque over the cavernous segment of the internal carotid arteries. Skull: Within normal. Sinuses/Orbits: Within normal. IMPRESSION: No acute intracranial findings. Electronically Signed   By: Marin Olp M.D.   On: 11/08/2016 15:56   Ct Abdomen Pelvis W Contrast  Result Date: 11/08/2016 CLINICAL DATA:  Initial evaluation for weakness, weight loss, constipation. EXAM: CT ABDOMEN AND PELVIS WITH CONTRAST TECHNIQUE: Multidetector CT imaging of the abdomen and pelvis was performed using the standard protocol following bolus administration of intravenous contrast. CONTRAST:  123mL ISOVUE-300 IOPAMIDOL (ISOVUE-300) INJECTION 61% COMPARISON:  Prior radiograph from earlier the same day. FINDINGS: Lower chest: Mild scattered atelectatic changes present within the visualized lung bases. Visualized lungs are otherwise clear. No pleural or pericardial fusion. Hepatobiliary: Liver demonstrates a normal contrast enhanced appearance. Gallbladder within normal limits. No biliary dilatation. Pancreas: Pancreas within normal limits. Spleen: Scattered hypodense lesions noted within the spleen, largest of which measures 9 mm. These are indeterminate, but are of doubtful clinical significance. Spleen otherwise unremarkable. Adrenals/Urinary Tract: 2.3 cm hypodense nodule noted within the right adrenal gland, most likely a small adenoma. Left adrenal gland within normal limits. Kidneys equal in size with symmetric enhancement. Scattered parapelvic cyst noted within the left kidney. Few scattered calcifications within the left renal hilum suspicious for small nonobstructive calculi. These measure up to 3 mm. No hydronephrosis. No focal enhancing renal mass. Scattered cortical thinning/scarring noted. Ureters are of normal caliber without acute abnormality. Bladder within normal limits. Stomach/Bowel: Small hiatal  hernia noted. Stomach otherwise unremarkable. Small bowel within normal limits for caliber without evidence for obstruction. Colonic diverticulosis without evidence for acute diverticulitis. Appendix not visualize, compatible with history of prior appendectomy. No abnormal wall thickening, mucosal enhancement, or inflammatory fat stranding seen about the bowels. Large amount of retained stool within the rectal vault, suggesting constipation. Vascular/Lymphatic: Normal intravascular enhancement seen throughout the intra-abdominal aorta and its branch vessels. Moderate aorto bi-iliac atherosclerotic disease. No aneurysm. No pathologically enlarged intra-abdominal or pelvic lymph nodes. Reproductive: Prostate within normal limits. Other: No free air or fluid. Small bilateral fat containing inguinal hernias noted. Musculoskeletal: No acute osseous abnormality. No worrisome lytic or blastic osseous lesions. Moderate multilevel degenerative spondylolysis noted within the visualized spine. IMPRESSION: 1. No CT evidence for acute intra-abdominal or pelvic process. 2. Large amount of retained stool within the rectal vault, suggesting constipation. 3. Sigmoid diverticulosis without evidence for acute diverticulitis. 4. Question punctate nonobstructive left renal nephrolithiasis. Electronically Signed   By: Jeannine Boga M.D.   On: 11/08/2016 18:08    Scheduled Meds: . cefTRIAXone (ROCEPHIN)  IV  2 g Intravenous Q24H  . docusate sodium  100 mg Oral BID  . enoxaparin (  LOVENOX) injection  40 mg Subcutaneous QHS  . hydrocortisone   Rectal BID  . insulin aspart  0-15 Units Subcutaneous TID WC  . sodium chloride flush  3 mL Intravenous Q12H   Continuous Infusions: . 0.9 % NaCl with KCl 20 mEq / L 125 mL/hr at 11/09/16 1638     LOS: 1 day   Kalia Vahey, Orpah Melter, MD [facility] Pager [phone]  If 7PM-7AM, please contact night-coverage [email or web address] Password [facility] 11/09/2016, 6:44 PM

## 2016-11-09 NOTE — Progress Notes (Signed)
In and out cath completed, 600 cc. Night rn to re-scan bladder

## 2016-11-09 NOTE — ED Notes (Signed)
[place], clinic nurse at [facility], wants to be contacted once pt gets a bed assignment. [phone].

## 2016-11-09 NOTE — ED Notes (Signed)
Hospitalist at bedside 

## 2016-11-09 NOTE — ED Notes (Signed)
Called main lab to draw ethanol due to multiple unsuccessful sticks in the ED

## 2016-11-10 ENCOUNTER — Inpatient Hospital Stay (HOSPITAL_COMMUNITY): Payer: PPO

## 2016-11-10 DIAGNOSIS — E669 Obesity, unspecified: Secondary | ICD-10-CM

## 2016-11-10 DIAGNOSIS — K5900 Constipation, unspecified: Secondary | ICD-10-CM | POA: Diagnosis not present

## 2016-11-10 DIAGNOSIS — E871 Hypo-osmolality and hyponatremia: Secondary | ICD-10-CM | POA: Diagnosis not present

## 2016-11-10 DIAGNOSIS — K5641 Fecal impaction: Secondary | ICD-10-CM | POA: Diagnosis not present

## 2016-11-10 DIAGNOSIS — E1169 Type 2 diabetes mellitus with other specified complication: Secondary | ICD-10-CM | POA: Diagnosis not present

## 2016-11-10 DIAGNOSIS — N183 Chronic kidney disease, stage 3 (moderate): Secondary | ICD-10-CM

## 2016-11-10 DIAGNOSIS — I480 Paroxysmal atrial fibrillation: Secondary | ICD-10-CM | POA: Diagnosis not present

## 2016-11-10 DIAGNOSIS — R109 Unspecified abdominal pain: Secondary | ICD-10-CM | POA: Diagnosis not present

## 2016-11-10 DIAGNOSIS — N179 Acute kidney failure, unspecified: Secondary | ICD-10-CM | POA: Diagnosis not present

## 2016-11-10 DIAGNOSIS — E876 Hypokalemia: Secondary | ICD-10-CM

## 2016-11-10 DIAGNOSIS — I481 Persistent atrial fibrillation: Secondary | ICD-10-CM | POA: Diagnosis not present

## 2016-11-10 DIAGNOSIS — K5901 Slow transit constipation: Secondary | ICD-10-CM

## 2016-11-10 DIAGNOSIS — I482 Chronic atrial fibrillation: Secondary | ICD-10-CM | POA: Diagnosis not present

## 2016-11-10 DIAGNOSIS — E440 Moderate protein-calorie malnutrition: Secondary | ICD-10-CM | POA: Insufficient documentation

## 2016-11-10 DIAGNOSIS — R1084 Generalized abdominal pain: Secondary | ICD-10-CM | POA: Diagnosis not present

## 2016-11-10 DIAGNOSIS — I10 Essential (primary) hypertension: Secondary | ICD-10-CM

## 2016-11-10 DIAGNOSIS — E43 Unspecified severe protein-calorie malnutrition: Secondary | ICD-10-CM

## 2016-11-10 LAB — URINE CULTURE

## 2016-11-10 LAB — BASIC METABOLIC PANEL
ANION GAP: 10 (ref 5–15)
BUN: 20 mg/dL (ref 6–20)
CALCIUM: 7.7 mg/dL — AB (ref 8.9–10.3)
CO2: 20 mmol/L — AB (ref 22–32)
Chloride: 103 mmol/L (ref 101–111)
Creatinine, Ser: 0.83 mg/dL (ref 0.61–1.24)
GLUCOSE: 130 mg/dL — AB (ref 65–99)
POTASSIUM: 3.7 mmol/L (ref 3.5–5.1)
Sodium: 133 mmol/L — ABNORMAL LOW (ref 135–145)

## 2016-11-10 LAB — GLUCOSE, CAPILLARY
GLUCOSE-CAPILLARY: 132 mg/dL — AB (ref 65–99)
GLUCOSE-CAPILLARY: 130 mg/dL — AB (ref 65–99)
GLUCOSE-CAPILLARY: 93 mg/dL (ref 65–99)
Glucose-Capillary: 130 mg/dL — ABNORMAL HIGH (ref 65–99)

## 2016-11-10 LAB — MAGNESIUM: Magnesium: 1.8 mg/dL (ref 1.7–2.4)

## 2016-11-10 LAB — HEMOGLOBIN A1C
HEMOGLOBIN A1C: 5.2 % (ref 4.8–5.6)
MEAN PLASMA GLUCOSE: 103 mg/dL

## 2016-11-10 MED ORDER — ZINC OXIDE 40 % EX OINT
TOPICAL_OINTMENT | CUTANEOUS | Status: DC | PRN
  Administered 2016-11-10: 02:00:00 via TOPICAL
  Filled 2016-11-10 (×3): qty 114

## 2016-11-10 MED ORDER — ENSURE ENLIVE PO LIQD
237.0000 mL | Freq: Every day | ORAL | Status: DC
  Administered 2016-11-10 – 2016-11-14 (×16): 237 mL via ORAL

## 2016-11-10 MED ORDER — LORAZEPAM 0.5 MG PO TABS
0.5000 mg | ORAL_TABLET | Freq: Two times a day (BID) | ORAL | Status: DC | PRN
  Administered 2016-11-11 – 2016-11-13 (×4): 0.5 mg via ORAL
  Filled 2016-11-10 (×4): qty 1

## 2016-11-10 MED ORDER — TRAMADOL HCL 50 MG PO TABS
50.0000 mg | ORAL_TABLET | Freq: Four times a day (QID) | ORAL | Status: DC | PRN
  Administered 2016-11-10 – 2016-11-17 (×8): 50 mg via ORAL
  Filled 2016-11-10 (×8): qty 1

## 2016-11-10 MED ORDER — DEXTROSE 5 % IV SOLN
1.0000 g | INTRAVENOUS | Status: AC
  Administered 2016-11-10 – 2016-11-14 (×5): 1 g via INTRAVENOUS
  Filled 2016-11-10 (×5): qty 10

## 2016-11-10 MED ORDER — ZOLPIDEM TARTRATE 5 MG PO TABS
5.0000 mg | ORAL_TABLET | Freq: Once | ORAL | Status: AC
  Administered 2016-11-10: 5 mg via ORAL
  Filled 2016-11-10: qty 1

## 2016-11-10 MED ORDER — MAGNESIUM CITRATE PO SOLN
1.0000 | Freq: Once | ORAL | Status: AC
  Administered 2016-11-10: 1 via ORAL
  Filled 2016-11-10: qty 296

## 2016-11-10 MED ORDER — TAMSULOSIN HCL 0.4 MG PO CAPS
0.4000 mg | ORAL_CAPSULE | Freq: Every day | ORAL | Status: DC
  Administered 2016-11-10 – 2016-11-26 (×16): 0.4 mg via ORAL
  Filled 2016-11-10 (×16): qty 1

## 2016-11-10 MED ORDER — ADULT MULTIVITAMIN W/MINERALS CH
1.0000 | ORAL_TABLET | Freq: Every day | ORAL | Status: DC
  Administered 2016-11-10 – 2016-11-21 (×11): 1 via ORAL
  Filled 2016-11-10 (×11): qty 1

## 2016-11-10 NOTE — Evaluation (Signed)
Occupational Therapy Evaluation Patient Details Name: [name] MRN: [id number] DOB: [birth year] Today's Date: 11/10/2016    History of Present Illness  [name] is a 65 y.o. male with medical history significant of A. Fib not on anticoagulation, DM Type 2, HTN, HLD, depression - he is not taking medication for any of these problems and has not seen a physician in 2 years.  Patient with severe constipation, malnutrition, having trouble walking, having trouble keeping energy.  Right arm partially immobile - ?pinched nerve in neck for months.   Clinical Impression   This 65 yo male admitted with above presents to acute OT with deficits below (see OT problem list ) thus affecting his PLOF of barely able to take care of himself due to letting himself go (his report). He will benefit from acute OT with follow up OT at SNF to work back towards and Mod I/ independent level which I feel he is capable of.    Follow Up Recommendations  SNF;Supervision/Assistance - 24 hour    Equipment Recommendations  Other (comment) (TBD at next venue)       Precautions / Restrictions Precautions Precautions: Fall Restrictions Weight Bearing Restrictions: No      Mobility Bed Mobility Overal bed mobility: Needs Assistance Bed Mobility: Rolling Rolling: Modified independent (Device/Increase time)         General bed mobility comments: increased time and use of rail  Transfers                 General transfer comment: unable due to anytime pt moved he was have oozing diarrhea         ADL Overall ADL's : Needs assistance/impaired Eating/Feeding: Independent;Bed level   Grooming: Set up;Bed level   Upper Body Bathing: Set up;Bed level   Lower Body Bathing: Total assistance;Bed level   Upper Body Dressing : Maximal assistance;Bed level   Lower Body Dressing: Total assistance;Bed level                       Pertinent Vitals/Pain Pain Assessment: Faces Pain  Score: 10-Worst pain ever Pain Location: Intermittently in abdomen and anal area (due to constipation with diarrhea)  Pain Descriptors / Indicators: Grimacing;Guarding;Cramping;Sharp Pain Intervention(s): Limited activity within patient's tolerance;Monitored during session;Repositioned     Hand Dominance Right   Extremity/Trunk Assessment Upper Extremity Assessment Upper Extremity Assessment: RUE deficits/detail RUE Deficits / Details: Decrease AROM shoulder and hand (long standing per pt due to a pinched nerve in his neck) RUE Coordination: decreased fine motor;decreased gross motor   Lower Extremity Assessment Lower Extremity Assessment: Defer to PT evaluation       Communication Communication Communication: No difficulties   Cognition Arousal/Alertness: Awake/alert Behavior During Therapy: WFL for tasks assessed/performed Overall Cognitive Status: Within Functional Limits for tasks assessed                                Home Living Family/patient expects to be discharged to:: Unsure (pt homeless)                                        Prior Functioning/Environment          Comments: Was not taking care of himself pta per his own report and had gotten weaker and weaker (not walking much and when he did  he used a 3W walker--currently in his room)        OT Problem List: Decreased strength;Decreased range of motion;Decreased activity tolerance;Impaired balance (sitting and/or standing);Impaired UE functional use;Pain   OT Treatment/Interventions: Self-care/ADL training;Therapeutic activities;Patient/family education;DME and/or AE instruction;Balance training    OT Goals(Current goals can be found in the care plan section) Acute Rehab OT Goals Patient Stated Goal: to get this consitipation/diarrhea issue taken care of OT Goal Formulation: With patient Time For Goal Achievement: 11/24/16 Potential to Achieve Goals: Good  OT Frequency: Min  2X/week   Barriers to D/C: Decreased caregiver support             End of Session Nurse Communication:  (issue with bowels)  Activity Tolerance: Patient limited by pain (limited by oozing diarrhea and abdomen pain) Patient left: in bed;with bed alarm set   Time: [id number] OT Time Calculation (min): 39 min Charges:  OT General Charges $OT Visit: 1 Procedure OT Evaluation $OT Eval Moderate Complexity: 1 Procedure OT Treatments $Self Care/Home Management : 23-37 mins  Almon Register [id number] 11/10/2016, 10:01 AM

## 2016-11-10 NOTE — Progress Notes (Signed)
  Attempted to do Echocardiogram, patient was being transported to X-Ray at 3:06pm, Will try again tomorrow.    [name] 11/10/2016, 3:08 PM

## 2016-11-10 NOTE — Consult Note (Signed)
Referring Provider: [facility]   Primary Care Physician:  Sandi Mariscal, MD Primary Gastroenterologist:  unassigned  Reason for Consultation:  severe constipation  HPI: [name] is a 65 y.o. male who presented to ED night with constipation. He was tachycardic, mildly hypotensive and hyponatremic.U/A abnormal, may have UTI.  CT scan showed large stool burden. Attempt at disimpaction was unsuccessful. He has been given a SMOG enema with minimal response. Now incontinent of watery stool and having waves of rectal pain. Repeat plain films around 4pm still shows stool in rectum.  Patient is homeless. He has a hx of DM, AFib, and HTN but not compliant with medications. Patient   got evicted from apartment recently, became depressed and started letting his health go. He became constipated a couple of months, tried stool softeners but eventually had to stop eating. No rectal bleeding. He has been going to [employer] and drinking fluids but eating very little. No [place] of colon cancer. Patient has never had a colonoscopy.   Past Medical History:  Diagnosis Date  . Atrial fibrillation ([facility])   . Cataract   . Diabetes mellitus without complication ([facility])   . Hyperlipidemia   . Hypertension   . Lichen planus    bilateral legs    Past Surgical History:  Procedure Laterality Date  . APPENDECTOMY    . FRACTURE SURGERY    . MULTIPLE TOOTH EXTRACTIONS      Prior to Admission medications   Medication Sig Start Date End Date Taking? Authorizing Provider  buPROPion (WELLBUTRIN XL) 150 MG 24 hr tablet Take 150 mg by mouth daily.    Historical Provider, MD  cyanocobalamin 500 MCG tablet Take 500 mcg by mouth daily.    Historical Provider, MD  Multiple Vitamin (MULTIVITAMIN) capsule Take 1 capsule by mouth daily.    Historical Provider, MD  Omega 3 1200 MG CAPS Take 2 capsules by mouth daily.    Historical Provider, MD  OVER THE COUNTER MEDICATION OTC Calcium w/ Vitamin D 3 taking     Historical Provider, MD  OVER THE COUNTER MEDICATION OTC Magnesium 400 mg taking    Historical Provider, MD  ranitidine (ZANTAC) 150 MG capsule Take 150 mg by mouth as needed for heartburn.    Historical Provider, MD    Current Facility-Administered Medications  Medication Dose Route Frequency Provider Last Rate Last Dose  . 0.9 % NaCl with KCl 20 mEq/ L  infusion   Intravenous Continuous Barton Dubois, MD 100 mL/hr at 11/10/16 1346    . acetaminophen (TYLENOL) tablet 650 mg  650 mg Oral Q6H PRN Karmen Bongo, MD   650 mg at 11/09/16 2034   Or  . acetaminophen (TYLENOL) suppository 650 mg  650 mg Rectal Q6H PRN Karmen Bongo, MD      . bisacodyl (DULCOLAX) EC tablet 5 mg  5 mg Oral Daily PRN Karmen Bongo, MD      . cefTRIAXone (ROCEPHIN) 2 g in dextrose 5 % 50 mL IVPB  2 g Intravenous Q24H Karmen Bongo, MD   2 g at 11/09/16 2016  . docusate sodium (COLACE) capsule 100 mg  100 mg Oral BID Karmen Bongo, MD   100 mg at 11/10/16 0845  . enoxaparin (LOVENOX) injection 95 mg  1 mg/kg Subcutaneous Q12H Donne Hazel, MD   95 mg at 11/10/16 0846  . hydrocortisone (ANUSOL-HC) 2.5 % rectal cream   Rectal BID Donne Hazel, MD      . insulin aspart (novoLOG) injection  0-15 Units  0-15 Units Subcutaneous TID WC Karmen Bongo, MD   2 Units at 11/10/16 1216  . labetalol (NORMODYNE,TRANDATE) injection 5 mg  5 mg Intravenous Q2H PRN Donne Hazel, MD   5 mg at 11/09/16 1817  . lidocaine (XYLOCAINE) 5 % ointment   Topical Daily PRN Donne Hazel, MD      . liver oil-zinc oxide (DESITIN) 40 % ointment   Topical PRN Gardiner Barefoot, NP      . LORazepam (ATIVAN) tablet 0.5 mg  0.5 mg Oral Q12H PRN Barton Dubois, MD      . metoprolol tartrate (LOPRESSOR) tablet 12.5 mg  12.5 mg Oral BID Donne Hazel, MD   12.5 mg at 11/10/16 0845  . ondansetron (ZOFRAN) tablet 4 mg  4 mg Oral Q6H PRN Karmen Bongo, MD       Or  . ondansetron [facility]) injection 4 mg  4 mg Intravenous Q6H PRN Karmen Bongo, MD       . polyethylene glycol (MIRALAX / GLYCOLAX) packet 17 g  17 g Oral Daily PRN Karmen Bongo, MD   17 g at 11/09/16 0824  . sodium chloride flush (NS) 0.9 % injection 3 mL  3 mL Intravenous Q12H Karmen Bongo, MD   3 mL at 11/09/16 0026    Allergies as of 11/08/2016 - Review Complete 11/08/2016  Allergen Reaction Noted  . Other  09/13/2013    Family History  Problem Relation Age of Onset  . Brain cancer Mother   . Alzheimer's disease Father   . Cancer Maternal Grandmother   . Heart attack Maternal Grandfather   . Pneumonia Paternal Grandfather     Social History   Social History  . Marital status: Unknown    Spouse name: N/A  . Number of children: N/A  . Years of education: N/A   Occupational History  . unemployed    Social History Main Topics  . Smoking status: Former Smoker    Types: Pipe    Quit date: 09/14/1983  . Smokeless tobacco: Never Used  . Alcohol use No  . Drug use: No  . Sexual activity: No   Other Topics Concern  . Not on file   Social History Narrative  . No narrative on file    Review of Systems: All systems reviewed and negative except where noted in HPI.  Physical Exam: Vital signs in last 24 hours: Temp:  [97.5 F (36.4 C)-98.7 F (37.1 C)] 98.7 F (37.1 C) (11/29 1300) Pulse Rate:  [95-130] 101 (11/29 1300) Resp:  [15-18] 18 (11/29 1300) BP: (100-121)/(62-88) 102/68 (11/29 1300) SpO2:  [97 %-100 %] 97 % (11/29 1300) Weight:  [205 lb 14.6 oz (93.4 kg)] 205 lb 14.6 oz (93.4 kg) (11/28 2012) Last BM Date: 11/10/16 General:   Alert,  Obese white male complaining of significant waves of rectal pain.  Head:  Normocephalic and atraumatic. Eyes:  Sclera clear, no icterus.   Conjunctiva pink. Ears:  Normal auditory acuity. Nose:  No deformity, discharge,  or lesions. Mouth:  No deformity or lesions.  Tongue is dry Neck:  Supple; no masses. Lungs:  Wasn't able to sit up for auscultation (pain). No obvious wheezing.[name]  Heart:  Regular  rate and rhythm, no peripheral edema Abdomen:  Soft, obese, nondistended, active bowel sounds Rectal:  Refused Msk:  Symmetrical without gross deformities. . Pulses:  Normal pulses noted. Neurologic:  Alert and  oriented x4;  grossly normal neurologically. Skin:  Distally both legs with  red patches, small excoriations and dry sloughing skin .[name] Psych:  Alert and cooperative. Normal mood and affect.  Intake/Output from previous day: 11/28 0701 - 11/29 0700 In: 2415 [P.O.:240; I.V.:1825; IV Piggyback:350] Out: [id number] [Urine:1275] Intake/Output this shift: Total I/O In: 1109.2 [P.O.:240; I.V.:869.2] Out: -   Lab Results:  Recent Labs  11/08/16 1411 11/09/16 0301  WBC 6.6 6.0  HGB 13.6 12.9*  HCT 38.0* 35.5*  PLT 163 134*   BMET  Recent Labs  11/08/16 1411 11/09/16 0301 11/10/16 0520  NA 128* 131* 133*  K 3.3* 3.3* 3.7  CL 89* 96* 103  CO2 24 23 20*  GLUCOSE 130* 122* 130*  BUN 36* 31* 20  CREATININE 1.36* 1.04 0.83  CALCIUM 9.4 8.4* 7.7*   LFT  Recent Labs  11/08/16 1411  PROT 7.7  ALBUMIN 4.2  AST 17  ALT 11*  ALKPHOS 76  BILITOT 2.1*  BILIDIR 0.6*  IBILI 1.5*     Studies/Results: Ct Abdomen Pelvis W Contrast  Result Date: 11/08/2016 CLINICAL DATA:  Initial evaluation for weakness, weight loss, constipation. EXAM: CT ABDOMEN AND PELVIS WITH CONTRAST TECHNIQUE: Multidetector CT imaging of the abdomen and pelvis was performed using the standard protocol following bolus administration of intravenous contrast. CONTRAST:  11mL ISOVUE-300 IOPAMIDOL (ISOVUE-300) INJECTION 61% COMPARISON:  Prior radiograph from earlier the same day. FINDINGS: Lower chest: Mild scattered atelectatic changes present within the visualized lung bases. Visualized lungs are otherwise clear. No pleural or pericardial fusion. Hepatobiliary: Liver demonstrates a normal contrast enhanced appearance. Gallbladder within normal limits. No biliary dilatation. Pancreas: Pancreas within normal  limits. Spleen: Scattered hypodense lesions noted within the spleen, largest of which measures 9 mm. These are indeterminate, but are of doubtful clinical significance. Spleen otherwise unremarkable. Adrenals/Urinary Tract: 2.3 cm hypodense nodule noted within the right adrenal gland, most likely a small adenoma. Left adrenal gland within normal limits. Kidneys equal in size with symmetric enhancement. Scattered parapelvic cyst noted within the left kidney. Few scattered calcifications within the left renal hilum suspicious for small nonobstructive calculi. These measure up to 3 mm. No hydronephrosis. No focal enhancing renal mass. Scattered cortical thinning/scarring noted. Ureters are of normal caliber without acute abnormality. Bladder within normal limits. Stomach/Bowel: Small hiatal hernia noted. Stomach otherwise unremarkable. Small bowel within normal limits for caliber without evidence for obstruction. Colonic diverticulosis without evidence for acute diverticulitis. Appendix not visualize, compatible with history of prior appendectomy. No abnormal wall thickening, mucosal enhancement, or inflammatory fat stranding seen about the bowels. Large amount of retained stool within the rectal vault, suggesting constipation. Vascular/Lymphatic: Normal intravascular enhancement seen throughout the intra-abdominal aorta and its branch vessels. Moderate aorto bi-iliac atherosclerotic disease. No aneurysm. No pathologically enlarged intra-abdominal or pelvic lymph nodes. Reproductive: Prostate within normal limits. Other: No free air or fluid. Small bilateral fat containing inguinal hernias noted. Musculoskeletal: No acute osseous abnormality. No worrisome lytic or blastic osseous lesions. Moderate multilevel degenerative spondylolysis noted within the visualized spine. IMPRESSION: 1. No CT evidence for acute intra-abdominal or pelvic process. 2. Large amount of retained stool within the rectal vault, suggesting  constipation. 3. Sigmoid diverticulosis without evidence for acute diverticulitis. 4. Question punctate nonobstructive left renal nephrolithiasis. Electronically Signed   By: Jeannine Boga M.D.   On: 11/08/2016 18:08   Dg Abd 2 Views  Result Date: 11/10/2016 CLINICAL DATA:  Acute generalized abdominal pain. EXAM: ABDOMEN - 2 VIEW COMPARISON:  CT scan of November 08, 2016. FINDINGS: The bowel gas pattern is normal. There is  no evidence of free air. No radio-opaque calculi or other significant radiographic abnormality is seen. Stool is noted in the rectum. IMPRESSION: No definite evidence of bowel obstruction or ileus. Electronically Signed   By: Marijo Conception, M.D.   On: 11/10/2016 15:53    IMPRESSION / PLAN:   79. 65 yo homeless male admitted with severe constipation. Attempt by staff to disimpact were unsuccessful. SMOG enema only small yield. Incontinent of liquid stool now, probably overflow diarrhea. Repeat plain films this afternoon still shows stool in rectum. No masses on CTscan but obviously one could be obscured by stool. Patient refused rectal exam. He doesn't want any additional enemas today either..  -explained to patient that we need to continue to work on purging bowels. Will give bottle of Mg+ Citrate this evening. Will reassess him in am and decide on repeating a SMOG then. -Complains of significant rectal pain. Will try Ultram   2. Electrolytes disturbances. Hyponatremia, improving. Na+ up to 133. Mild hypokalemia now resolved.   3. ? UTI, culture sent but looks contaminated  4. Mild Thrombocytopenia. Normal size spleen on imaging but with scattered hypodensities  5. AKI, resolved with IV fluids  6. Mild hyperbilirubinemia, mainly indirect though. No liver lesions on CTscan  Tye Savoy  11/10/2016, 4:12 PM Pager number [phone]  GI ATTENDING  History, laboratories, x-rays reviewed. Agree with comprehensive consultation note as outlined above. Patient  admitted with fecal impaction. Recommendations as above to assist with disimpaction.  The nature of the procedure, as well as the risks, benefits, and alternatives were carefully and thoroughly reviewed with the patient. Ample time for discussion and questions allowed. The patient understood, was satisfied, and agreed to proceed.

## 2016-11-10 NOTE — Progress Notes (Addendum)
PROGRESS NOTE    [name]  [id number] DOB: 02/20/1951 DOA: 11/08/2016 PCP: Sandi Mariscal, MD    Brief Narrative:  65 y.o. male with medical history significant ofA. Fib not on anticoagulation, DM Type 2, HTN, HLD, depression who presents initially with complaints of uncontrolled constipation. Patient admitted to being essentially noncompliant with any of his regimen for the past several years. In the emergency department, patient was noted to have symptomatically hyponatremia and tachycardia. Patient was also mildly hypotensive, receiving IV fluid hydration. Patient also found to have significant stool burden on CT abdomen pelvis. Fecal distraction attempt was without success. Smog enema yielded limited success. The patient was ultimately admitted for further management of multiple medical issues  Assessment & Plan:   Principal Problem:   Constipation Active Problems:   Essential hypertension, benign   Atrial fibrillation ([facility])   Diabetes mellitus type 2 in obese ([facility])   Hyponatremia   UTI (urinary tract infection)   Hypokalemia   CKD (chronic kidney disease) stage 3, GFR 30-59 ml/min   Hyperbilirubinemia   Homelessness  Constipation -Patient with history of chronic constipation (approx 2 months of ongoing constipation) -Failed manual disimpaction in the emergency department -has experienced multiple BM's after repeated enemas; even he still feel obstructed. -never has had colonoscopy -GI consulted, for potential sigmoidoscopy vs colonoscopy -repeat x-ray w/o ileus or SBO  HTN -Mildly hypotensive during initial presentation -Patient is continued on IV fluid hydration -Essentially has been untreated for several years secondary to lack of compliance -will monitor and adjust  antihypertensive agents as needed  -currently well controlled on metoprolol  Afib with rapid ventricular rate -Previously on Eliquis -CHA2DS2-VASc score is 3, with a 3.2% stroke risk per year -If  patient remains homeless, thus will have to find an ideal anticoagulant for patient prior to discharge -will continue metoprolol -follow electrolytes and correct them as needed   DM -Essentially has not been taking any medications for several years -Hemoglobin A1c is pending -Continue sliding scale insulin while inpatient   Hyponatremia -Improved with IV fluid resuscitation -Repeat BMET to follow Na level -Continue to encourage proper by mouth intake  UTI -Reports dysuria -UA is abnormal  -Patient was given 1 dose of Rocephin the emergency department -Urine culture ordered, pending -No leukocytosis. No fevers  Hypokalemia -Mild, K+ 3.3 at presentation -Potassium included an IV fluids -follow trend and repeat as needed   CKD -Creatinine improved to 1.04 from 1.36 with IV fluids -Repeat basic metabolic panel to renal function trend -appears to have stage 2-3 at baseline -dehydration/pre-renal azotemia provoking AKI  Urinary retention -acute -given need for > 2 I's and O's and bladder scan with 394 Ml -will start patient on flomax and insert foley  Hyperbilirubinemia -Uncertain etiology -Bilirubin was 2.1 -will follow LFT's -Urine drug screen negative, alcohol levels are negative  Homelessness -SW consult for possible assistance with placement  Severe protein calorie malnutrition  -will follow nutritional service rec's -feeding supplements and encouragement to increase PO intake provided  DVT prophylaxis: Therapeutic Lovenox Code Status: Full code Family Communication: Patient in room, family not at bedside Disposition Plan: will stabilize patient condition and follow SW/CM assistance for safe discharge  Consultants:   GI  Procedures:   See below for x-ray reports   Antimicrobials: Anti-infectives    Start     Dose/Rate Route Frequency Ordered Stop   11/09/16 2000  cefTRIAXone (ROCEPHIN) 2 g in dextrose 5 % 50 mL IVPB     2 g 100 mL/hr  over 30  Minutes Intravenous Every 24 hours 11/09/16 0312     11/08/16 2045  cefTRIAXone (ROCEPHIN) 2 g in dextrose 5 % 50 mL IVPB     2 g 100 mL/hr over 30 Minutes Intravenous  Once 11/08/16 2042 11/08/16 2229      Subjective: Complaining of loose stools and some non-specific abd pain. Denies fever, nausea, vomiting or any other complaints.   Objective: Vitals:   11/09/16 2012 11/09/16 2046 11/10/16 0407 11/10/16 0843  BP:  121/88 100/62 111/69  Pulse:  (!) 115 95 (!) 110  Resp:  16 16 18   Temp:  97.9 F (36.6 C) 97.5 F (36.4 C)   TempSrc:  Oral Oral   SpO2:  100% 100% 100%  Weight: 93.4 kg (205 lb 14.6 oz)     Height: 6\' 2"  (1.88 m)       Intake/Output Summary (Last 24 hours) at 11/10/16 1413 Last data filed at 11/10/16 1400  Gross per 24 hour  Intake          2574.17 ml  Output             1275 ml  Net          1299.17 ml   Filed Weights   11/09/16 2012  Weight: 93.4 kg (205 lb 14.6 oz)    Examination: General exam: Afebrile, reports having abd pain and some bowel incontinence. No CP or SOB. Patient also denies nausea and vomiting. Respiratory system: Clear to auscultation. Respiratory effort normal. Cardiovascular system: S1 & S2 heard, Tachycardic Gastrointestinal system: Abdomen is mild distended, soft and w/o specific tenderness point. No organomegaly or masses felt. Normal bowel sounds heard. Central nervous system: Alert and oriented. No focal neurological deficits. Extremities: Symmetric 5 x 5 power. Skin: No rashes, lesions Psychiatry: Judgement and insight appear normal. Mood & affect appropriate. With intermittent rapid mood swings   Data Reviewed: I have personally reviewed following labs and imaging studies  CBC:  Recent Labs Lab 11/08/16 1411 11/09/16 0301  WBC 6.6 6.0  NEUTROABS 5.5  --   HGB 13.6 12.9*  HCT 38.0* 35.5*  MCV 91.1 91.7  PLT 163 [id number]*   Basic Metabolic Panel:  Recent Labs Lab 11/08/16 1411 11/09/16 0301 11/10/16 0520  NA 128*  131* 133*  K 3.3* 3.3* 3.7  CL 89* 96* 103  CO2 24 23 20*  GLUCOSE 130* 122* 130*  BUN 36* 31* 20  CREATININE 1.36* 1.04 0.83  CALCIUM 9.4 8.4* 7.7*  MG  --  1.7 1.8   GFR: Estimated Creatinine Clearance: 103.2 mL/min (by C-G formula based on SCr of 0.83 mg/dL).   Liver Function Tests:  Recent Labs Lab 11/08/16 1411  AST 17  ALT 11*  ALKPHOS 76  BILITOT 2.1*  PROT 7.7  ALBUMIN 4.2    Recent Labs Lab 11/08/16 1426  LIPASE 25   HbA1C:  Recent Labs  11/09/16 0017  HGBA1C 5.2   CBG:  Recent Labs Lab 11/09/16 1227 11/09/16 1812 11/09/16 2125 11/10/16 0800 11/10/16 1202  GLUCAP 149* 145* 134* 132* 130*   Sepsis Labs:  Recent Labs Lab 11/08/16 2239 11/09/16 0244  LATICACIDVEN 3.63* 1.40    Recent Results (from the past 240 hour(s))  Urine culture     Status: Abnormal   Collection Time: 11/08/16  7:47 PM  Result Value Ref Range Status   Specimen Description URINE, CLEAN CATCH  Final   Special Requests NONE  Final   Culture MULTIPLE SPECIES PRESENT, SUGGEST  RECOLLECTION (A)  Final   Report Status 11/10/2016 FINAL  Final     Radiology Studies: Dg Abdomen 1 View  Result Date: 11/08/2016 CLINICAL DATA:  Constipation over the last month. EXAM: ABDOMEN - 1 VIEW COMPARISON:  None. FINDINGS: Gas pattern does not suggest ileus or obstruction. Amount of fecal matter in the colon is within normal limits with the exception of slightly prominent stool in the rectal vault. No significant soft tissue calcifications. Some vascular calcification noted. Chronic degenerative changes affect the spine. IMPRESSION: No markedly abnormal findings. Overall amount of fecal matter appears within normal limits. Slightly prominent stool in the rectal vault. Electronically Signed   By: Nelson Chimes M.D.   On: 11/08/2016 14:29   Ct Head Wo Contrast  Result Date: 11/08/2016 CLINICAL DATA:  Generalized weakness. Poor historian. Difficulty raising right arm. EXAM: CT HEAD WITHOUT  CONTRAST TECHNIQUE: Contiguous axial images were obtained from the base of the skull through the vertex without intravenous contrast. COMPARISON:  None. FINDINGS: Brain: Ventricles and cisterns are within normal. There is minimal prominence of the CSF spaces compatible with age related atrophy. There is no mass, mass effect, shift of midline structures or acute hemorrhage. No evidence of acute infarction. Vascular: Minimal calcified plaque over the cavernous segment of the internal carotid arteries. Skull: Within normal. Sinuses/Orbits: Within normal. IMPRESSION: No acute intracranial findings. Electronically Signed   By: Marin Olp M.D.   On: 11/08/2016 15:56   Ct Abdomen Pelvis W Contrast  Result Date: 11/08/2016 CLINICAL DATA:  Initial evaluation for weakness, weight loss, constipation. EXAM: CT ABDOMEN AND PELVIS WITH CONTRAST TECHNIQUE: Multidetector CT imaging of the abdomen and pelvis was performed using the standard protocol following bolus administration of intravenous contrast. CONTRAST:  144mL ISOVUE-300 IOPAMIDOL (ISOVUE-300) INJECTION 61% COMPARISON:  Prior radiograph from earlier the same day. FINDINGS: Lower chest: Mild scattered atelectatic changes present within the visualized lung bases. Visualized lungs are otherwise clear. No pleural or pericardial fusion. Hepatobiliary: Liver demonstrates a normal contrast enhanced appearance. Gallbladder within normal limits. No biliary dilatation. Pancreas: Pancreas within normal limits. Spleen: Scattered hypodense lesions noted within the spleen, largest of which measures 9 mm. These are indeterminate, but are of doubtful clinical significance. Spleen otherwise unremarkable. Adrenals/Urinary Tract: 2.3 cm hypodense nodule noted within the right adrenal gland, most likely a small adenoma. Left adrenal gland within normal limits. Kidneys equal in size with symmetric enhancement. Scattered parapelvic cyst noted within the left kidney. Few scattered  calcifications within the left renal hilum suspicious for small nonobstructive calculi. These measure up to 3 mm. No hydronephrosis. No focal enhancing renal mass. Scattered cortical thinning/scarring noted. Ureters are of normal caliber without acute abnormality. Bladder within normal limits. Stomach/Bowel: Small hiatal hernia noted. Stomach otherwise unremarkable. Small bowel within normal limits for caliber without evidence for obstruction. Colonic diverticulosis without evidence for acute diverticulitis. Appendix not visualize, compatible with history of prior appendectomy. No abnormal wall thickening, mucosal enhancement, or inflammatory fat stranding seen about the bowels. Large amount of retained stool within the rectal vault, suggesting constipation. Vascular/Lymphatic: Normal intravascular enhancement seen throughout the intra-abdominal aorta and its branch vessels. Moderate aorto bi-iliac atherosclerotic disease. No aneurysm. No pathologically enlarged intra-abdominal or pelvic lymph nodes. Reproductive: Prostate within normal limits. Other: No free air or fluid. Small bilateral fat containing inguinal hernias noted. Musculoskeletal: No acute osseous abnormality. No worrisome lytic or blastic osseous lesions. Moderate multilevel degenerative spondylolysis noted within the visualized spine. IMPRESSION: 1. No CT evidence for acute intra-abdominal or  pelvic process. 2. Large amount of retained stool within the rectal vault, suggesting constipation. 3. Sigmoid diverticulosis without evidence for acute diverticulitis. 4. Question punctate nonobstructive left renal nephrolithiasis. Electronically Signed   By: Jeannine Boga M.D.   On: 11/08/2016 18:08    Scheduled Meds: . cefTRIAXone (ROCEPHIN)  IV  2 g Intravenous Q24H  . docusate sodium  100 mg Oral BID  . enoxaparin (LOVENOX) injection  1 mg/kg Subcutaneous Q12H  . hydrocortisone   Rectal BID  . insulin aspart  0-15 Units Subcutaneous TID WC  .  metoprolol tartrate  12.5 mg Oral BID  . sodium chloride flush  3 mL Intravenous Q12H   Continuous Infusions: . 0.9 % NaCl with KCl 20 mEq / L 100 mL/hr at 11/10/16 1346     LOS: 2 days   Barton Dubois, MD [facility] Pager [phone]  If 7PM-7AM, please contact night-coverage [email or web address] Password [email or web address] 11/10/2016, 2:13 PM

## 2016-11-10 NOTE — Progress Notes (Signed)
Initial Nutrition Assessment  DOCUMENTATION CODES:   Severe malnutrition in context of chronic illness  INTERVENTION:  Provide Ensure Enlive po 5 times daily, each supplement provides 350 kcal and 20 grams of protein. Can decrease number of Ensure given as constipation improves and patient able to tolerate solid food again.  Provide multivitamin with minerals daily.  NUTRITION DIAGNOSIS:   Inadequate oral intake related to poor appetite, other (see comment) (constipation) as evidenced by per patient/family report, 36 percent weight loss over 8 months.  GOAL:   Patient will meet greater than or equal to 90% of their needs  MONITOR:   PO intake, Supplement acceptance, Labs, Weight trends, I & O's, Skin  REASON FOR ASSESSMENT:   Consult Assessment of nutrition requirement/status  ASSESSMENT:   65 y.o.malewith medical history significant ofA. Fib not on anticoagulation, DM Type 2, HTN, HLD, depression who presents initially with complaints of uncontrolled constipation. Patient admitted to being essentially noncompliant with any of his regimen for the past several years.   Spoke with patient at bedside. He reports poor appetite for 2-3 months due to severe constipation and abdominal pain. Denies N/V or difficulty chewing/swallowing. Patient reports with the constipation he has not been able to tolerate food and he has gone weeks at a time without eating these past few months. Situation further complicated by homelessness, however patient reports that he has access to meals at the [facility], he just cannot tolerate eating.   Patient reports UBW of 320 lbs and that he has lost a significant amount of weight since March. Per patient report, he has lost 115 lbs (36% body weight) over 8 months, which is significant for time frame. Last weight in chart was 346 lbs on 08/06/2014.  Medications reviewed and include: Colace, Novolog sliding scale TID with meals, NS with KCl 20 mEq/L @ 100  ml/hr.  Labs reviewed: CBG 130-145 past 24 hrs, Sodium 133, CO2 20.  Nutrition-Focused physical exam completed. Findings are mild fat depletion, moderate muscle depletion, and mild edema. Small red spots noted on skin all over body, if these are petechiae may be related to Vitamin C or Vitamin K deficiency if no other medical cause found.   Patient meets criteria for severe chronic malnutrition in setting of 36% weight loss over 8 months, intake </= 75% of estimated energy requirement for >/= 1 month.   Discussed with RN.   Diet Order:  Diet Carb Modified Fluid consistency: Thin; Room service appropriate? Yes  Skin:  Reviewed, no issues  Last BM:  11/10/2016  Height:   Ht Readings from Last 1 Encounters:  11/09/16 6\' 2"  (1.88 m)    Weight:   Wt Readings from Last 1 Encounters:  11/09/16 205 lb 14.6 oz (93.4 kg)    Ideal Body Weight:  86.36 kg  BMI:  Body mass index is 26.44 kg/m.  Estimated Nutritional Needs:   Kcal:  2000-2300 (MSJ x 1.1-1.3)  Protein:  95-115 grams (1-1.2 grams/kg)  Fluid:  >/= 2.3 L/day (25 ml/kg)  EDUCATION NEEDS:   Education needs addressed (Encouraged adequate intake of calories and protein through small, frequent meals. Also discussed liquids may be best tolerated now.)  Willey Blade, MS, RD, LDN Pager: [phone] After Hours Pager: [phone]

## 2016-11-10 NOTE — Progress Notes (Signed)
Pt w/ no void since 0815 despite attempting to use urinal twice.  Bladder scan reveals 347ml in bladder.  Dr Dyann Kief aware.  Orders to place foley cath.  Will implement orders.

## 2016-11-10 NOTE — Plan of Care (Signed)
Problem: Bowel/Gastric: Goal: Will not experience complications related to bowel motility Outcome: Not Met (add Reason) Pt admitted w/ constipation. Having frequent oozing stool with abd cramps.

## 2016-11-10 NOTE — Progress Notes (Signed)
PT Cancellation Note  Patient Details Name: Azaan Merker MRN: [id number] DOB: Oct 26, 1951   Cancelled Treatment:    Reason Eval/Treat Not Completed: Medical issues which prohibited therapy (continues to have frequent loose BM's, has test and GI consult pending. will evaluate when patient can sfaely ambulate without  BM. )  Tresa Endo PT [phone]  Claretha Cooper 11/10/2016, 12:34 PM

## 2016-11-10 NOTE — NC FL2 (Signed)
[place] LEVEL OF CARE SCREENING TOOL     IDENTIFICATION  Patient Name: [name] Birthdate: 1951-10-25 Sex: male Admission Date (Current Location): 11/08/2016  [facility] and [place] Number:  Herbalist and Address:  [facility],  [place] [address], [place]      Provider Number: [id number]  Attending Physician Name and Address:  Barton Dubois, MD  Relative Name and Phone Number:       Current Level of Care: Hospital Recommended Level of Care: [place] Prior Approval Number:    Date Approved/Denied:   PASRR Number: [id number]  Discharge Plan: SNF    Current Diagnoses: Patient Active Problem List   Diagnosis Date Noted  . Constipation 11/09/2016  . UTI (urinary tract infection) 11/09/2016  . Hypokalemia 11/09/2016  . CKD (chronic kidney disease) stage 3, GFR 30-59 ml/min 11/09/2016  . Hyperbilirubinemia 11/09/2016  . Homelessness 11/09/2016  . Hyponatremia 11/08/2016  . RBBB 09/13/2013  . Snoring 09/13/2013  . Edema 09/07/2013  . Diabetes mellitus type 2 in obese ([place]) 03/16/2013  . Arthritis 03/16/2013  . Essential hypertension, benign 02/23/2013  . Morbid obesity ([place]) 02/23/2013  . Atrial fibrillation ([place]) 02/23/2013  . Other and unspecified hyperlipidemia 02/23/2013    Orientation RESPIRATION BLADDER Height & Weight     Self, Time, Situation, Place  Normal Incontinent Weight: 205 lb 14.6 oz (93.4 kg) Height:  6\' 2"  (188 cm)  BEHAVIORAL SYMPTOMS/MOOD NEUROLOGICAL BOWEL NUTRITION STATUS      Incontinent Diet (Carb Modified)  AMBULATORY STATUS COMMUNICATION OF NEEDS Skin   Extensive Assist Verbally PU Stage and Appropriate Care (Wound / Incision (Open or Dehisced) 11/09/16 Sacrum stage II )                       Personal Care Assistance Level of Assistance  Bathing, Dressing Bathing Assistance: Limited assistance   Dressing Assistance: Limited assistance     Functional  Limitations Info             [place]  PT (By licensed PT), OT (By licensed OT)     PT Frequency: 5 OT Frequency: 5            Contractures      Additional Factors Info  Code Status, Allergies Code Status Info: Fullcode             Current Medications (11/10/2016):  This is the current hospital active medication list Current Facility-Administered Medications  Medication Dose Route Frequency Provider Last Rate Last Dose  . 0.9 % NaCl with KCl 20 mEq/ L  infusion   Intravenous Continuous Karmen Bongo, MD 125 mL/hr at 11/10/16 0904    . acetaminophen (TYLENOL) tablet 650 mg  650 mg Oral Q6H PRN Karmen Bongo, MD   650 mg at 11/09/16 2034   Or  . acetaminophen (TYLENOL) suppository 650 mg  650 mg Rectal Q6H PRN Karmen Bongo, MD      . bisacodyl (DULCOLAX) EC tablet 5 mg  5 mg Oral Daily PRN Karmen Bongo, MD      . cefTRIAXone (ROCEPHIN) 2 g in dextrose 5 % 50 mL IVPB  2 g Intravenous Q24H Karmen Bongo, MD   2 g at 11/09/16 2016  . docusate sodium (COLACE) capsule 100 mg  100 mg Oral BID Karmen Bongo, MD   100 mg at 11/10/16 0845  . enoxaparin (LOVENOX) injection 95 mg  1 mg/kg Subcutaneous Q12H Donne Hazel, MD  95 mg at 11/10/16 0846  . hydrocortisone (ANUSOL-HC) 2.5 % rectal cream   Rectal BID Donne Hazel, MD      . insulin aspart (novoLOG) injection 0-15 Units  0-15 Units Subcutaneous TID [facility] Karmen Bongo, MD   2 Units at 11/10/16 0845  . labetalol (NORMODYNE,TRANDATE) injection 5 mg  5 mg Intravenous Q2H PRN Donne Hazel, MD   5 mg at 11/09/16 1817  . lidocaine (XYLOCAINE) 5 % ointment   Topical Daily PRN Donne Hazel, MD      . liver oil-zinc oxide (DESITIN) 40 % ointment   Topical PRN Gardiner Barefoot, NP      . metoprolol tartrate (LOPRESSOR) tablet 12.5 mg  12.5 mg Oral BID Donne Hazel, MD   12.5 mg at 11/10/16 0845  . ondansetron (ZOFRAN) tablet 4 mg  4 mg Oral Q6H PRN Karmen Bongo, MD       Or  . ondansetron  [facility]) injection 4 mg  4 mg Intravenous Q6H PRN Karmen Bongo, MD      . polyethylene glycol (MIRALAX / GLYCOLAX) packet 17 g  17 g Oral Daily PRN Karmen Bongo, MD   17 g at 11/09/16 0824  . sodium chloride flush (NS) 0.9 % injection 3 mL  3 mL Intravenous Q12H Karmen Bongo, MD   3 mL at 11/09/16 0026     Discharge Medications: Please see discharge summary for a list of discharge medications.  Relevant Imaging Results:  Relevant Lab Results:   Additional Information SSN: [id number]  Standley Brooking, LCSW

## 2016-11-11 ENCOUNTER — Inpatient Hospital Stay (HOSPITAL_COMMUNITY): Payer: PPO

## 2016-11-11 DIAGNOSIS — I482 Chronic atrial fibrillation: Secondary | ICD-10-CM | POA: Diagnosis not present

## 2016-11-11 DIAGNOSIS — N179 Acute kidney failure, unspecified: Secondary | ICD-10-CM | POA: Diagnosis not present

## 2016-11-11 DIAGNOSIS — I480 Paroxysmal atrial fibrillation: Secondary | ICD-10-CM | POA: Diagnosis not present

## 2016-11-11 DIAGNOSIS — N390 Urinary tract infection, site not specified: Secondary | ICD-10-CM

## 2016-11-11 DIAGNOSIS — E871 Hypo-osmolality and hyponatremia: Secondary | ICD-10-CM | POA: Diagnosis not present

## 2016-11-11 DIAGNOSIS — I481 Persistent atrial fibrillation: Secondary | ICD-10-CM | POA: Diagnosis not present

## 2016-11-11 DIAGNOSIS — N183 Chronic kidney disease, stage 3 (moderate): Secondary | ICD-10-CM | POA: Diagnosis not present

## 2016-11-11 DIAGNOSIS — K5900 Constipation, unspecified: Secondary | ICD-10-CM | POA: Diagnosis not present

## 2016-11-11 DIAGNOSIS — K5901 Slow transit constipation: Secondary | ICD-10-CM | POA: Diagnosis not present

## 2016-11-11 DIAGNOSIS — R109 Unspecified abdominal pain: Secondary | ICD-10-CM | POA: Diagnosis not present

## 2016-11-11 DIAGNOSIS — K5649 Other impaction of intestine: Secondary | ICD-10-CM

## 2016-11-11 DIAGNOSIS — R1084 Generalized abdominal pain: Secondary | ICD-10-CM | POA: Diagnosis not present

## 2016-11-11 DIAGNOSIS — E1169 Type 2 diabetes mellitus with other specified complication: Secondary | ICD-10-CM | POA: Diagnosis not present

## 2016-11-11 DIAGNOSIS — K5641 Fecal impaction: Secondary | ICD-10-CM | POA: Diagnosis not present

## 2016-11-11 LAB — GLUCOSE, CAPILLARY
GLUCOSE-CAPILLARY: 161 mg/dL — AB (ref 65–99)
GLUCOSE-CAPILLARY: 151 mg/dL — AB (ref 65–99)
Glucose-Capillary: 107 mg/dL — ABNORMAL HIGH (ref 65–99)
Glucose-Capillary: 94 mg/dL (ref 65–99)

## 2016-11-11 NOTE — Progress Notes (Signed)
  Echocardiogram 2D Echocardiogram was attempted but patient said he was in a lot of pain and wanted to do this exam later. Will try back tomorrow.   [name] 11/11/2016, 2:01 PM

## 2016-11-11 NOTE — Progress Notes (Signed)
PROGRESS NOTE    [name]  [id number] DOB: June 07, 1951 DOA: 11/08/2016 PCP: Sandi Mariscal, MD   Brief Narrative:  65 y.o. male with medical history significant ofA. Fib not on anticoagulation, DM Type 2, HTN, HLD, depression who presents initially with complaints of uncontrolled constipation. Patient admitted to being essentially noncompliant with any of his regimen for the past several years. In the emergency department, patient was noted to have symptomatically hyponatremia and tachycardia. Patient was also mildly hypotensive, receiving IV fluid hydration. Patient also found to have significant stool burden on CT abdomen pelvis. Fecal distraction attempt was without success. Smog enema yielded limited success. The patient was ultimately admitted for further management of multiple medical issues  Assessment & Plan:   Principal Problem:   Constipation Active Problems:   Essential hypertension, benign   Atrial fibrillation (HCC)   Diabetes mellitus type 2 in obese (HCC)   Hyponatremia   UTI (urinary tract infection)   Hypokalemia   CKD (chronic kidney disease) stage 3, GFR 30-59 ml/min   Hyperbilirubinemia   Homelessness   Protein-calorie malnutrition, severe  Constipation -Patient with history of chronic constipation (approx 2 months of ongoing constipation) -Failed manual disimpaction in the emergency department -has experienced multiple BM's after repeated enemas; even he still feel obstructed. -never has had colonoscopy -GI consulted, for potential sigmoidoscopy vs colonoscopy; currently plan will be for gastrograffin enema (looking to help with purge of the colon and assist r/o a mass)  -repeat x-ray w/o ileus or SBO  HTN -Mildly hypotensive during initial presentation -Patient is continued on IV fluid hydration -Essentially has been untreated for several years secondary to lack of compliance -will monitor and adjust  antihypertensive agents as needed  -currently well  controlled on metoprolol  Afib with rapid ventricular rate -Previously on Eliquis -CHA2DS2-VASc score is 3, with a 3.2% stroke risk per year -If patient remains homeless, thus will have to find an ideal anticoagulant for patient prior to discharge -will continue metoprolol -follow electrolytes and correct them as needed   DM -Essentially has not been taking any medications for several years -Hemoglobin A1c is 5.2 -Continue sliding scale insulin while inpatient   Hyponatremia -Improved with IV fluid resuscitation -Repeat BMET to follow Na level -Continue to encourage proper by mouth intake  UTI -Reports dysuria -UA is abnormal  -Patient was given 1 dose of Rocephin the emergency department -Urine culture ordered, pending -No leukocytosis. No fevers  Hypokalemia -Mild, K+ 3.3 at presentation -Potassium included in IV fluids -follow trend and replete as needed   CKD -Creatinine improved to 1.04 from 1.36 with IV fluids -Repeat basic metabolic panel to renal function trend -appears to have stage 2-3 at baseline -dehydration/pre-renal azotemia provoking AKI  Urinary retention -acute -given need for > 2 In and out Cath and bladder scan with >318ml -patient started on flomax and insert foley  Hyperbilirubinemia -Uncertain etiology -Bilirubin was 2.1 -will follow LFT's -Urine drug screen negative, alcohol levels are negative  Homelessness -SW consult for possible assistance with placement  Severe protein calorie malnutrition  -will follow nutritional service rec's -feeding supplements and encouragement to increase PO intake provided  DVT prophylaxis: Therapeutic Lovenox Code Status: Full code Family Communication: Patient in room, family not at bedside Disposition Plan: will stabilize patient condition and follow SW/CM assistance for safe discharge  Consultants:   GI  Procedures:   See below for x-ray reports   Antimicrobials: Anti-infectives     Start     Dose/Rate Route Frequency Ordered  Stop   11/10/16 2000  cefTRIAXone (ROCEPHIN) 1 g in dextrose 5 % 50 mL IVPB     1 g 100 mL/hr over 30 Minutes Intravenous Every 24 hours 11/10/16 1847     11/09/16 2000  cefTRIAXone (ROCEPHIN) 2 g in dextrose 5 % 50 mL IVPB  Status:  Discontinued     2 g 100 mL/hr over 30 Minutes Intravenous Every 24 hours 11/09/16 0312 11/10/16 1847   11/08/16 2045  cefTRIAXone (ROCEPHIN) 2 g in dextrose 5 % 50 mL IVPB     2 g 100 mL/hr over 30 Minutes Intravenous  Once 11/08/16 2042 11/08/16 2229      Subjective: Complaining of loose stool incontinence, no-specific abd pain and sensation of bloating/still been constipated/impacted.   Objective: Vitals:   11/10/16 0843 11/10/16 1300 11/10/16 2126 11/11/16 0718  BP: 111/69 102/68 90/67 96/65   Pulse: (!) 110 (!) 101 (!) 47 (!) 112  Resp: 18 18 15 16   Temp:  98.7 F (37.1 C) 97.9 F (36.6 C) 97.8 F (36.6 C)  TempSrc:  Oral Oral Oral  SpO2: 100% 97% 99% 99%  Weight:      Height:        Intake/Output Summary (Last 24 hours) at 11/11/16 1814 Last data filed at 11/11/16 0748  Gross per 24 hour  Intake             1670 ml  Output              650 ml  Net             1020 ml   Filed Weights   11/09/16 2012  Weight: 93.4 kg (205 lb 14.6 oz)    Examination: General exam: Afebrile, reports having abd pain and continue to experience bowel incontinence. No CP or SOB. Patient is anxious and endorses having some intermittent nausea. Multiple semi-liquid stools appreciated this morning. Respiratory system: Clear to auscultation. Respiratory effort normal. Cardiovascular system: S1 & S2 heard, Tachycardic Gastrointestinal system: Abdomen is mildly distended, soft and w/o specific tenderness point (kind of general discomfort on deep palpation). No organomegaly or masses felt. Normal bowel sounds heard. Central nervous system: Alert and oriented. No focal neurological deficits. Extremities: Symmetric 5 x 5  power. Skin: No rashes, lesions Psychiatry: Judgement and insight appear normal. Mood & affect appropriate. With intermittent rapid mood swings   Data Reviewed: I have personally reviewed following labs and imaging studies  CBC:  Recent Labs Lab 11/08/16 1411 11/09/16 0301  WBC 6.6 6.0  NEUTROABS 5.5  --   HGB 13.6 12.9*  HCT 38.0* 35.5*  MCV 91.1 91.7  PLT 163 [id number]*   Basic Metabolic Panel:  Recent Labs Lab 11/08/16 1411 11/09/16 0301 11/10/16 0520  NA 128* 131* 133*  K 3.3* 3.3* 3.7  CL 89* 96* 103  CO2 24 23 20*  GLUCOSE 130* 122* 130*  BUN 36* 31* 20  CREATININE 1.36* 1.04 0.83  CALCIUM 9.4 8.4* 7.7*  MG  --  1.7 1.8   GFR: Estimated Creatinine Clearance: 103.2 mL/min (by C-G formula based on SCr of 0.83 mg/dL).   Liver Function Tests:  Recent Labs Lab 11/08/16 1411  AST 17  ALT 11*  ALKPHOS 76  BILITOT 2.1*  PROT 7.7  ALBUMIN 4.2    Recent Labs Lab 11/08/16 1426  LIPASE 25   HbA1C:  Recent Labs  11/09/16 0017  HGBA1C 5.2   CBG:  Recent Labs Lab 11/10/16 1701 11/10/16 2124 11/11/16 0752 11/11/16  1158 11/11/16 1754  GLUCAP 93 130* 161* 107* 94   Sepsis Labs:  Recent Labs Lab 11/08/16 2239 11/09/16 0244  LATICACIDVEN 3.63* 1.40    Recent Results (from the past 240 hour(s))  Urine culture     Status: Abnormal   Collection Time: 11/08/16  7:47 PM  Result Value Ref Range Status   Specimen Description URINE, CLEAN CATCH  Final   Special Requests NONE  Final   Culture MULTIPLE SPECIES PRESENT, SUGGEST RECOLLECTION (A)  Final   Report Status 11/10/2016 FINAL  Final     Radiology Studies: Dg Abd 2 Views  Result Date: 11/10/2016 CLINICAL DATA:  Acute generalized abdominal pain. EXAM: ABDOMEN - 2 VIEW COMPARISON:  CT scan of November 08, 2016. FINDINGS: The bowel gas pattern is normal. There is no evidence of free air. No radio-opaque calculi or other significant radiographic abnormality is seen. Stool is noted in the rectum.  IMPRESSION: No definite evidence of bowel obstruction or ileus. Electronically Signed   By: Marijo Conception, M.D.   On: 11/10/2016 15:53    Scheduled Meds: . cefTRIAXone (ROCEPHIN)  IV  1 g Intravenous Q24H  . docusate sodium  100 mg Oral BID  . enoxaparin (LOVENOX) injection  1 mg/kg Subcutaneous Q12H  . feeding supplement (ENSURE ENLIVE)  237 mL Oral 5 X Daily  . hydrocortisone   Rectal BID  . insulin aspart  0-15 Units Subcutaneous TID WC  . metoprolol tartrate  12.5 mg Oral BID  . multivitamin with minerals  1 tablet Oral Daily  . sodium chloride flush  3 mL Intravenous Q12H  . tamsulosin  0.4 mg Oral Daily   Continuous Infusions: . 0.9 % NaCl with KCl 20 mEq / L 100 mL/hr at 11/11/16 0554     LOS: 3 days   Barton Dubois, MD [facility] Pager [phone]  If 7PM-7AM, please contact night-coverage [email or web address] Password [email or web address] 11/11/2016, 6:14 PM

## 2016-11-11 NOTE — Progress Notes (Signed)
PT Cancellation Note  Patient Details Name: [name] MRN: [id number] DOB: [birth year]   Cancelled Treatment:    Reason Eval/Treat Not Completed: Medical issues which prohibited therapy RN reports pt anxious and just received Ativan, also to get enemas.  Requests PT check back tomorrow.   Taniesha Glanz,KATHrine E 11/11/2016, 1:32 PM Carmelia Bake, PT, DPT 11/11/2016 Pager: [phone]

## 2016-11-11 NOTE — Progress Notes (Signed)
LCSW attempted to meet with patient regarding disposition and placement options. Patient was very confused regarding his plans for aftercare, he becomes frustrated and states "people are handling it".  LCSW explained that yes that this writer is handling options and wanted to give bed offers. Educated patient on what SNF included and also ALF.  At this time, SNF has remained the recommendation. Patient remained resistant to assistance and apologized, just reported he was in a lot of pain and did not know what was going on.  He reports he has no family to help make his decisions.  He is alert and oriented and states he is his own guardian, but he makes very poor decisions.    Patient has 2 bed offers which were given to him, but he would not make a decision. He asked if LCSW would contact the shelter and see if they can give more insight on patient.  LCSW has called [facility] who has met with patient and also attempting to assist with placement.  Will follow up in the morning again with placement.  Lane Hacker, MSW Clinical Social Work: [employer] Coverage for :  [phone]

## 2016-11-11 NOTE — Progress Notes (Signed)
     [name] Progress Note  Chief Complaint:   constipation   Subjective: Still has waves of rectal pain. Incontinent of liquid stool  Objective:  Vital signs in last 24 hours: Temp:  [97.8 F (36.6 C)-98.7 F (37.1 C)] 97.8 F (36.6 C) (11/30 0718) Pulse Rate:  [47-112] 112 (11/30 0718) Resp:  [15-18] 16 (11/30 0718) BP: (90-102)/(65-68) 96/65 (11/30 0718) SpO2:  [97 %-99 %] 99 % (11/30 0718) Last BM Date: 11/10/16 General:   Alert, obese white male in NAD EENT:  Normal hearing, non icteric sclera, conjunctive pink.  Abdomen:  Soft, nondistended Rectal:  Large amount of light brown liquid stool covering perineum. DRE - still has stool in vaut though soft so unable to extract Neurologic:  Alert and  oriented x4;  grossly normal neurologically. Psych:  Alert and cooperative.   Intake/Output from previous day: 11/29 0701 - 11/30 0700 In: 3019.2 [P.O.:1200; I.V.:1769.2; IV Piggyback:50] Out: 400 [Urine:400] Intake/Output this shift: Total I/O In: 880 [I.V.:880] Out: 250 [Urine:250]  Lab Results:  Recent Labs  11/08/16 1411 11/09/16 0301  WBC 6.6 6.0  HGB 13.6 12.9*  HCT 38.0* 35.5*  PLT 163 134*   BMET  Recent Labs  11/08/16 1411 11/09/16 0301 11/10/16 0520  NA 128* 131* 133*  K 3.3* 3.3* 3.7  CL 89* 96* 103  CO2 24 23 20*  GLUCOSE 130* 122* 130*  BUN 36* 31* 20  CREATININE 1.36* 1.04 0.83  CALCIUM 9.4 8.4* 7.7*   LFT  Recent Labs  11/08/16 1411  PROT 7.7  ALBUMIN 4.2  AST 17  ALT 11*  ALKPHOS 76  BILITOT 2.1*  BILIDIR 0.6*  IBILI 1.5*    Dg Abd 2 Views  Result Date: 11/10/2016 CLINICAL DATA:  Acute generalized abdominal pain. EXAM: ABDOMEN - 2 VIEW COMPARISON:  CT scan of November 08, 2016. FINDINGS: The bowel gas pattern is normal. There is no evidence of free air. No radio-opaque calculi or other significant radiographic abnormality is seen. Stool is noted in the rectum. IMPRESSION: No definite evidence of bowel  obstruction or ileus. Electronically Signed   By: Marijo Conception, M.D.   On: 11/10/2016 15:53    Assessment / Plan:  Severe constipation. He took Mg Citrate. Right now incontinent of large amount of thick liquid stool. Didn't want rectal exam last night but I told him it need to be done now. Still has large ball of mushy stool in vault so not able to do complete rectal exam to check for rectal masses. I think a couple of tap water enemas now will help. I will return in a little while to repeat DRE. Will need aggressive bowel regimen at discharge.     LOS: 3 days   [name]  11/11/2016, 10:34 AM Pager number [phone]  GI ATTENDING  Interval history data reviewed. Agree with interval progress note. Could consider Gastrografin enema as therapeutic and diagnostic to rule out any significant intracolonic pathology.  Docia Chuck. Geri [place]., M.D. [facility]

## 2016-11-12 ENCOUNTER — Other Ambulatory Visit (HOSPITAL_COMMUNITY): Payer: Medicare PPO

## 2016-11-12 ENCOUNTER — Inpatient Hospital Stay (HOSPITAL_COMMUNITY): Payer: PPO

## 2016-11-12 DIAGNOSIS — R109 Unspecified abdominal pain: Secondary | ICD-10-CM | POA: Diagnosis not present

## 2016-11-12 DIAGNOSIS — R1084 Generalized abdominal pain: Secondary | ICD-10-CM | POA: Diagnosis not present

## 2016-11-12 DIAGNOSIS — E1169 Type 2 diabetes mellitus with other specified complication: Secondary | ICD-10-CM | POA: Diagnosis not present

## 2016-11-12 DIAGNOSIS — K5901 Slow transit constipation: Secondary | ICD-10-CM | POA: Diagnosis not present

## 2016-11-12 DIAGNOSIS — E871 Hypo-osmolality and hyponatremia: Secondary | ICD-10-CM | POA: Diagnosis not present

## 2016-11-12 DIAGNOSIS — I4892 Unspecified atrial flutter: Secondary | ICD-10-CM | POA: Diagnosis not present

## 2016-11-12 DIAGNOSIS — I482 Chronic atrial fibrillation: Secondary | ICD-10-CM | POA: Diagnosis not present

## 2016-11-12 DIAGNOSIS — N179 Acute kidney failure, unspecified: Secondary | ICD-10-CM | POA: Diagnosis not present

## 2016-11-12 DIAGNOSIS — I480 Paroxysmal atrial fibrillation: Secondary | ICD-10-CM | POA: Diagnosis not present

## 2016-11-12 DIAGNOSIS — Z590 Homelessness: Secondary | ICD-10-CM | POA: Diagnosis not present

## 2016-11-12 DIAGNOSIS — N183 Chronic kidney disease, stage 3 (moderate): Secondary | ICD-10-CM | POA: Diagnosis not present

## 2016-11-12 DIAGNOSIS — K5900 Constipation, unspecified: Secondary | ICD-10-CM | POA: Diagnosis not present

## 2016-11-12 DIAGNOSIS — K5641 Fecal impaction: Secondary | ICD-10-CM | POA: Diagnosis not present

## 2016-11-12 DIAGNOSIS — I481 Persistent atrial fibrillation: Secondary | ICD-10-CM | POA: Diagnosis not present

## 2016-11-12 LAB — COMPREHENSIVE METABOLIC PANEL
ALBUMIN: 2.3 g/dL — AB (ref 3.5–5.0)
ALK PHOS: 48 U/L (ref 38–126)
ALT: 7 U/L — ABNORMAL LOW (ref 17–63)
ANION GAP: 8 (ref 5–15)
AST: 11 U/L — ABNORMAL LOW (ref 15–41)
BUN: 11 mg/dL (ref 6–20)
CALCIUM: 7.3 mg/dL — AB (ref 8.9–10.3)
CO2: 23 mmol/L (ref 22–32)
Chloride: 100 mmol/L — ABNORMAL LOW (ref 101–111)
Creatinine, Ser: 0.61 mg/dL (ref 0.61–1.24)
GFR calc Af Amer: >60 mL/min (ref >60)
GFR calc non Af Amer: >60 mL/min (ref >60)
GLUCOSE: 106 mg/dL — AB (ref 65–99)
Potassium: 4.5 mmol/L (ref 3.5–5.1)
SODIUM: 131 mmol/L — AB (ref 135–145)
Total Bilirubin: 0.6 mg/dL (ref 0.3–1.2)
Total Protein: 4.7 g/dL — ABNORMAL LOW (ref 6.5–8.1)

## 2016-11-12 LAB — GLUCOSE, CAPILLARY
GLUCOSE-CAPILLARY: 131 mg/dL — AB (ref 65–99)
GLUCOSE-CAPILLARY: 109 mg/dL — AB (ref 65–99)
Glucose-Capillary: 114 mg/dL — ABNORMAL HIGH (ref 65–99)
Glucose-Capillary: 105 mg/dL — ABNORMAL HIGH (ref 65–99)

## 2016-11-12 LAB — CBC
HCT: 28.7 % — ABNORMAL LOW (ref 39.0–52.0)
HEMOGLOBIN: 10.1 g/dL — AB (ref 13.0–17.0)
MCH: 33.7 pg (ref 26.0–34.0)
MCHC: 35.2 g/dL (ref 30.0–36.0)
MCV: 95.7 fL (ref 78.0–100.0)
Platelets: 114 10*3/uL — ABNORMAL LOW (ref 150–400)
RBC: 3 MIL/uL — AB (ref 4.22–5.81)
RDW: 14.5 % (ref 11.5–15.5)
WBC: 4.1 10*3/uL (ref 4.0–10.5)

## 2016-11-12 MED ORDER — DIATRIZOATE MEGLUMINE & SODIUM 66-10 % PO SOLN
240.0000 mL | Freq: Once | ORAL | Status: AC
  Administered 2016-11-12: 480 mL via ORAL
  Filled 2016-11-12: qty 240

## 2016-11-12 MED ORDER — DIGOXIN 0.25 MG/ML IJ SOLN
0.2500 mg | INTRAMUSCULAR | Status: AC
  Administered 2016-11-12 (×2): 0.25 mg via INTRAVENOUS
  Filled 2016-11-12 (×2): qty 1

## 2016-11-12 MED ORDER — PEG-KCL-NACL-NASULF-NA ASC-C 100 G PO SOLR
0.5000 | ORAL | Status: AC
  Administered 2016-11-12 (×2): 100 g via ORAL
  Filled 2016-11-12 (×2): qty 1

## 2016-11-12 MED ORDER — POLYETHYLENE GLYCOL 3350 17 G PO PACK: 17.0000 g | PACK | Freq: Every day | ORAL | Status: DC

## 2016-11-12 MED ORDER — DIGOXIN 125 MCG PO TABS
0.1250 mg | ORAL_TABLET | Freq: Every day | ORAL | Status: DC
  Administered 2016-11-13 – 2016-11-26 (×14): 0.125 mg via ORAL
  Filled 2016-11-12 (×14): qty 1

## 2016-11-12 MED ORDER — METOPROLOL TARTRATE 25 MG PO TABS
12.5000 mg | ORAL_TABLET | Freq: Three times a day (TID) | ORAL | Status: DC
  Administered 2016-11-12 – 2016-11-14 (×6): 12.5 mg via ORAL
  Filled 2016-11-12 (×7): qty 1

## 2016-11-12 NOTE — Progress Notes (Signed)
ANTICOAGULATION CONSULT NOTE - Follow-up Consult  Pharmacy Consult for Lovenox Indication: Atrial Fibrillation  Allergies  Allergen Reactions  . Other     [name] bandage (self-gripping) - erythema, itching    Patient Measurements: Height: 6\' 2"  (188 cm) Weight: 205 lb 14.6 oz (93.4 kg) IBW/kg (Calculated) : 82.2 Heparin Dosing Weight:   Vital Signs: Temp: 98.9 F (Allen.2 C) (12/01 0548) Temp Source: Oral (12/01 0548) BP: 90/63 (12/01 0646) Pulse Rate: 114 (12/01 0548)  Labs:  Recent Labs  11/10/16 0520 11/12/16 0430  HGB  --  10.1*  HCT  --  28.7*  PLT  --  114*  CREATININE 0.83 0.61    Estimated Creatinine Clearance: 107 mL/min (by C-G formula based on SCr of 0.61 mg/dL).   Medical History: Past Medical History:  Diagnosis Date  . Atrial fibrillation ([place])   . Cataract   . Diabetes mellitus without complication ([place])   . Hyperlipidemia   . Hypertension   . Lichen planus    bilateral legs    Assessment: [name] with PMHx significant for Afib not on anticoagulation, T2DM, HTN, HLD, and depression, homeless and noncompliant with medications for several years presents 11/27 with constipation and found to have hyponatremia, hypotension, and tachycardia.  Pt currently in Afib with RVR and pharmacy consulted to start full dose lovenox.    Today, 11/12/2016:  - Renal fx: SCr WNL, CrCl > 2ml/min - CBC: Hgb = 10.1 and Plts decreased (= 114)   Goal of Therapy:  Anti-Xa level 0.6-1 units/ml 4hrs after LMWH dose given Monitor platelets by anticoagulation protocol: Yes   Plan:   Continue Lovenox 1mg /kg q12h  CBC  q72h while in hospital  Await plan for long-term anticoagulation for Afib  Doreene Eland, PharmD, BCPS.   Pager: [phone] 11/12/2016 11:40 AM

## 2016-11-12 NOTE — Progress Notes (Signed)
Pt HR sustaining 120s up to 136 despite metoprolol 12.5mg .  BP soft 98/57.  Per echo tech, unable to perform echo unless HR <120.  Dr Dyann Kief made aware.  States he will consult cardiology.

## 2016-11-12 NOTE — Progress Notes (Signed)
LCSW attempted to see patient with [facility] and rehab liaison in effort to help patient feel more comfortable with the process of transitioning into acute rehab.  Patient not in room.  LCSW spoke at length with congregational nurse with [facility] who has a relationship with patient as he was active in clinic. [place] and LCSW discuss placement barriers with mistrust from patient and difficulty making decisions.   LCSW discussed bed offer with [facility] and rehab and patient's hesitancy.  With help from [place], we will work to help patient understand he is not safe to dc back to [facility] at this time and SNF is his best alternative in effort to remain safe and improve in conditioning.  Patient currently down in procedure.    Plan: Will follow up this afternoon with [place] and patient along with SNF rep regarding placement in hopes to solidify a bed.  Patient needs to work with therapy and barrier currently is no insurance authorization and need to understand baseline.  Lane Hacker, MSW Clinical Social Work: [employer] Coverage for :  [phone]

## 2016-11-12 NOTE — Progress Notes (Signed)
PROGRESS NOTE    [name]  [id number] DOB: August 10, 1951 DOA: 11/08/2016 PCP: Sandi Mariscal, MD   Brief Narrative:  65 y.o. male with medical history significant ofA. Fib not on anticoagulation, DM Type 2, HTN, HLD, depression who presents initially with complaints of uncontrolled constipation. Patient admitted to being essentially noncompliant with any of his regimen for the past several years. In the emergency department, patient was noted to have symptomatically hyponatremia and tachycardia. Patient was also mildly hypotensive, receiving IV fluid hydration. Patient also found to have significant stool burden on CT abdomen pelvis. Fecal distraction attempt was without success. Smog enema yielded limited success. The patient was ultimately admitted for further management of multiple medical issues  Assessment & Plan:   Principal Problem:   Constipation Active Problems:   Essential hypertension, benign   Atrial fibrillation (HCC)   Diabetes mellitus type 2 in obese (HCC)   Hyponatremia   Urinary tract infection without hematuria   Hypokalemia   CKD (chronic kidney disease) stage 3, GFR 30-59 ml/min   Hyperbilirubinemia   Homelessness   Protein-calorie malnutrition, severe   Impaction of colon (HCC)  Constipation -Patient with history of chronic constipation (approx 2 months of ongoing constipation) -Failed manual disimpaction in the emergency department -has experienced multiple BM's after repeated enemas; even he still feel obstructed. -never has had colonoscopy before -GI consulted, for potential sigmoidoscopy vs colonoscopy; currently plan will be for gastrograffin enema (looking to help with purge of the colon and assist r/o a mass). Will follow results and further rec's from GI  -will continue supportive care  HTN -Patient is continued on IV fluid hydration -Essentially has been untreated for several years secondary to lack of compliance -will monitor and adjust   antihypertensive agents as needed  -currently with soft to low BP while on metoprolol   Afib with rapid ventricular rate -Previously on Eliquis -CHA2DS2-VASc score is 2-3, with a 3.2% stroke risk per year -patient decline use of any anticoagulation  -will continue metoprolol -given difficulty to control rate and given low BP will consult cardiology -follow electrolytes and correct them as needed  -2-D echo pending   DM -Essentially has not been taking any medications for several years -Hemoglobin A1c is 5.2 -Continue sliding scale insulin while inpatient   Hyponatremia -Improved with IV fluid resuscitation -Repeat BMET to follow Na level -Continue to encourage proper by mouth intake  UTI -Reports dysuria -continue rocephin (will treat for 7 days) -Urine culture ordered, results demonstrating multiple microorganism  -No leukocytosis. No fevers  Hypokalemia -Mild, K+ 3.3 at presentation -Potassium included in IV fluids -follow trend and replete as needed   CKD -Creatinine improved to 1.04 from 1.36 with IV fluids -Repeat basic metabolic panel to renal function trend -appears to have stage 2-3 at baseline -dehydration/pre-renal azotemia provoking AKI  Urinary retention -acute -given need for > 2 In and out Cath and bladder scan with >332ml -patient started on flomax and insert foley  Hyperbilirubinemia -Uncertain etiology -Bilirubin was 2.1 -will follow LFT's -Urine drug screen negative, alcohol levels are negative  Homelessness -SW consult for possible assistance with placement  Severe protein calorie malnutrition  -will follow nutritional service rec's -feeding supplements and encouragement to increase PO intake provided  DVT prophylaxis: Therapeutic Lovenox Code Status: Full code Family Communication: Patient in room, family not at bedside Disposition Plan: will stabilize patient condition and follow SW/CM assistance for safe  discharge  Consultants:   GI  Procedures:   See below for x-ray  reports   Antimicrobials: Anti-infectives    Start     Dose/Rate Route Frequency Ordered Stop   11/10/16 2000  cefTRIAXone (ROCEPHIN) 1 g in dextrose 5 % 50 mL IVPB     1 g 100 mL/hr over 30 Minutes Intravenous Every 24 hours 11/10/16 1847     11/09/16 2000  cefTRIAXone (ROCEPHIN) 2 g in dextrose 5 % 50 mL IVPB  Status:  Discontinued     2 g 100 mL/hr over 30 Minutes Intravenous Every 24 hours 11/09/16 0312 11/10/16 1847   11/08/16 2045  cefTRIAXone (ROCEPHIN) 2 g in dextrose 5 % 50 mL IVPB     2 g 100 mL/hr over 30 Minutes Intravenous  Once 11/08/16 2042 11/08/16 2229      Subjective: Complaining of loose stool incontinence, rectum pain and no-specific abd pain, with sensation of bloating/still been constipated/impacted feeling.   Objective: Vitals:   11/12/16 0548 11/12/16 0643 11/12/16 0646 11/12/16 1321  BP: (!) 87/67 (!) 90/58 90/63 96/72   Pulse: (!) 114   (!) 126  Resp: 18   20  Temp: 98.9 F (37.2 C)   98 F (36.7 C)  TempSrc: Oral   Oral  SpO2: 98%   100%  Weight:      Height:        Intake/Output Summary (Last 24 hours) at 11/12/16 1841 Last data filed at 11/12/16 1400  Gross per 24 hour  Intake             3665 ml  Output              750 ml  Net             2915 ml   Filed Weights   11/09/16 2012  Weight: 93.4 kg (205 lb 14.6 oz)    Examination: General exam: Afebrile, reports having abd pain and continue to experience bowel incontinence. No CP or SOB. Patient is anxious and endorses having Multiple semi-liquid stools. Complaining of pain in his rectum now. Respiratory system: Clear to auscultation. Respiratory effort normal. Cardiovascular system: S1 & S2 heard, Tachycardic Gastrointestinal system: Abdomen is mildly distended, soft and w/o specific tenderness point (kind of general discomfort on deep palpation). No organomegaly or masses felt. Normal bowel sounds heard. Central  nervous system: Alert and oriented. No focal neurological deficits. Extremities: Symmetric 5 x 5 power. Skin: No rashes, lesions Psychiatry: Judgement and insight appear normal. Mood & affect appropriate. With intermittent rapid mood swings   Data Reviewed: I have personally reviewed following labs and imaging studies  CBC:  Recent Labs Lab 11/08/16 1411 11/09/16 0301 11/12/16 0430  WBC 6.6 6.0 4.1  NEUTROABS 5.5  --   --   HGB 13.6 12.9* 10.1*  HCT 38.0* 35.5* 28.7*  MCV 91.1 91.7 95.7  PLT 163 134* [id number]*   Basic Metabolic Panel:  Recent Labs Lab 11/08/16 1411 11/09/16 0301 11/10/16 0520 11/12/16 0430  NA 128* 131* 133* 131*  K 3.3* 3.3* 3.7 4.5  CL 89* 96* 103 100*  CO2 24 23 20* 23  GLUCOSE 130* 122* 130* 106*  BUN 36* 31* 20 11  CREATININE 1.36* 1.04 0.83 0.61  CALCIUM 9.4 8.4* 7.7* 7.3*  MG  --  1.7 1.8  --    GFR: Estimated Creatinine Clearance: 107 mL/min (by C-G formula based on SCr of 0.61 mg/dL).   Liver Function Tests:  Recent Labs Lab 11/08/16 1411 11/12/16 0430  AST 17 11*  ALT 11* 7*  ALKPHOS 76  48  BILITOT 2.1* 0.6  PROT 7.7 4.7*  ALBUMIN 4.2 2.3*    Recent Labs Lab 11/08/16 1426  LIPASE 25   HbA1C: No results for input(s): HGBA1C in the last 72 hours. CBG:  Recent Labs Lab 11/11/16 1754 11/11/16 2105 11/12/16 0759 11/12/16 1220 11/12/16 1723  GLUCAP 94 151* 114* 105* 131*   Sepsis Labs:  Recent Labs Lab 11/08/16 2239 11/09/16 0244  LATICACIDVEN 3.63* 1.40    Recent Results (from the past 240 hour(s))  Urine culture     Status: Abnormal   Collection Time: 11/08/16  7:47 PM  Result Value Ref Range Status   Specimen Description URINE, CLEAN CATCH  Final   Special Requests NONE  Final   Culture MULTIPLE SPECIES PRESENT, SUGGEST RECOLLECTION (A)  Final   Report Status 11/10/2016 FINAL  Final     Radiology Studies: Dg Colon Therapeutic W/cm  Result Date: 11/12/2016 CLINICAL DATA:  Severe constipation. EXAM: WATER  SOLUBLE CONTRAST ENEMA TECHNIQUE: Approximately 3 L of warm water mixed with 480 cc of Gastrografin was instilled per rectum. FLUOROSCOPY TIME:  Radiation Exposure Index (if provided by the fluoroscopic device): 183.47 mGy COMPARISON:  Radiographs dated 11/10/2016 FINDINGS: EKG knee demonstrates moderate stool in the ascending colon as well as in the rectum. The study shows a 6 mm stool ball in the rectum. There is also stool throughout the remainder of the colon particularly in the cecum. There are few diverticula in the sigmoid portion the colon. The study was suboptimal due to patient's inability to move well during the exam. Multiple smaller stool balls throughout the colon. I was able to get some contrast into the ascending colon. There are no discrete strictures or definitive mass lesions. The patient was unable to sit up to evacuate the contrast at the end of the exam. IMPRESSION: Extensive stool in the distal colon as well as in the ascending colon. No dilatation of the colon. Scattered diverticula in the sigmoid portion of the colon. No discrete obstruction or mass lesion. However, the study is suboptimal for detail because of the patient's inability to retain the contrast or move well during the exam. Electronically Signed   By: Lorriane Shire M.D.   On: 11/12/2016 12:46    Scheduled Meds: . cefTRIAXone (ROCEPHIN)  IV  1 g Intravenous Q24H  . digoxin  0.25 mg Intravenous Q4H  . [START ON 11/13/2016] digoxin  0.125 mg Oral Daily  . docusate sodium  100 mg Oral BID  . enoxaparin (LOVENOX) injection  1 mg/kg Subcutaneous Q12H  . feeding supplement (ENSURE ENLIVE)  237 mL Oral 5 X Daily  . hydrocortisone   Rectal BID  . insulin aspart  0-15 Units Subcutaneous TID WC  . metoprolol tartrate  12.5 mg Oral Q8H  . multivitamin with minerals  1 tablet Oral Daily  . [START ON 11/13/2016] polyethylene glycol  17 g Oral Daily  . sodium chloride flush  3 mL Intravenous Q12H  . tamsulosin  0.4 mg Oral Daily    Continuous Infusions: . 0.9 % NaCl with KCl 20 mEq / L 100 mL/hr at 11/12/16 1607     LOS: 4 days   Barton Dubois, MD [facility] Pager [phone]  If 7PM-7AM, please contact night-coverage [email or web address] Password [facility] 11/12/2016, 6:41 PM

## 2016-11-12 NOTE — Progress Notes (Signed)
NP on call made aware of low BP and increase HR, new order received.

## 2016-11-12 NOTE — Progress Notes (Addendum)
PT Cancellation Note  Patient Details Name: [name] MRN: [id number] DOB: [birth year]   Cancelled Treatment:    Reason Eval/Treat Not Completed: Pain limiting ability to participate. Attempted PT eval. Spoke with RN prior to attempt-she was ok with bed level activity on today. Max encouragement for pt to participate, even at bed level, however he would not agree to it. Will check back another day/time. Thanks.    Weston Anna, MPT Pager: [phone]

## 2016-11-12 NOTE — Progress Notes (Signed)
     [facility] Progress Note  Chief Complaint:  Constipation  Subjective: Just back from gastrograffin enema and reports little response. Did continue to have some loose since I saw him yesterday  Objective:  Vital signs in last 24 hours: Temp:  [97.9 F (36.6 C)-98.9 F (37.2 C)] 98.9 F (37.2 C) (12/01 0548) Pulse Rate:  [102-136] 114 (12/01 0548) Resp:  [18-20] 18 (12/01 0548) BP: (87-100)/(58-67) 90/63 (12/01 0646) SpO2:  [98 %-100 %] 98 % (12/01 0548) Last BM Date: 11/12/16 General:   Alert, obese white male in NAD EENT:  Normal hearing, non icteric sclera, conjunctive pink.  Heart:  Regular rate and rhythm Abdomen:  Soft, nondistended, nontender.  Normal bowel sounds, no masses felt. Neurologic:  Alert and  oriented x4;  grossly normal neurologically. Psych:  Alert and cooperative. Normal mood and affect.    Intake/Output from previous day: 11/30 0701 - 12/01 0700 In: 2930 [P.O.:480; I.V.:2400; IV Piggyback:50] Out: 1350 [Urine:1350] Intake/Output this shift: Total I/O In: 871.7 [I.V.:871.7] Out: -   Lab Results:  Recent Labs  11/12/16 0430  WBC 4.1  HGB 10.1*  HCT 28.7*  PLT 114*   BMET  Recent Labs  11/10/16 0520 11/12/16 0430  NA 133* 131*  K 3.7 4.5  CL 103 100*  CO2 20* 23  GLUCOSE 130* 106*  BUN 20 11  CREATININE 0.83 0.61  CALCIUM 7.7* 7.3*   LFT  Recent Labs  11/12/16 0430  PROT 4.7*  ALBUMIN 2.3*  AST 11*  ALT 7*  ALKPHOS 48  BILITOT 0.6    Dg Abd 2 Views  Result Date: 11/10/2016 CLINICAL DATA:  Acute generalized abdominal pain. EXAM: ABDOMEN - 2 VIEW COMPARISON:  CT scan of November 08, 2016. FINDINGS: The bowel gas pattern is normal. There is no evidence of free air. No radio-opaque calculi or other significant radiographic abnormality is seen. Stool is noted in the rectum. IMPRESSION: No definite evidence of bowel obstruction or ileus. Electronically Signed   By: Marijo Conception, M.D.   On: 11/10/2016 15:53     Assessment / Plan:  Severe constipation. Just back from gastrograffin enema, results pending. He has continued to have some loose/ mushy stools. No significant stool output with gastrograffin enema yet.  -await gastrograffin enema report and hopefully therapeutic results. Need to make sure no gross colorectal lesions present     LOS: 4 days   Tye Savoy  11/12/2016, 9:14 AM Pager number [phone]  GI ATTENDING  Interval history data reviewed. Patient personally seen and examined. Agree with interval progress note. Patient is clinically stable. Gastrografin enema reviewed. Lots of stool remains. No obvious obstructing lesion. At this point, the bulk of the impaction has improved and would now give colonoscopy bowel prep from above.  Docia Chuck. Geri [place]in[place]., M.D. [facility] Division of Gastroenterology

## 2016-11-12 NOTE — Consult Note (Signed)
Cardiology Consult    Patient ID: [name] MRN: [id number], DOB/AGE: 1951/07/09   Admit date: 11/08/2016 Date of Consult: 11/12/2016  Primary Physician: Sandi Mariscal, MD Reason for Consult: Afib Primary Cardiologist:  Dr. Debara Pickett Requesting Provider: Dr. Dyann Kief   Patient Profile    [name] is a 65 year old male with a past medical history of chronic atrial fibrillation, DM, HTN, and HLD. He presented with severe constipation, cardiology was consulted for rapid afib.   History of Present Illness    [name] is highly anxious as I talk with him, he at first was not wanting to see a Cardiologist at all. He states that he has Afib his entire life and knows everything there is to know about afib. Interestingly, when reviewing his telemetry, it appears that he is in atrial flutter. His rate is very dynamic, varying between 60's and 120's.   He denies palpitations, chest pain and SOB. He has had afib/flutter for many years and was previously on Eliquis but quit taking it as he "ran out of money".   He had an Echo in June of 2013 that showed an EF of 55% and mild left atrial dilation. He was noted to be in atrial fibrillation at that time.    Past Medical History   Past Medical History:  Diagnosis Date  . Atrial fibrillation ([place])   . Cataract   . Diabetes mellitus without complication ([place])   . Hyperlipidemia   . Hypertension   . Lichen planus    bilateral legs    Past Surgical History:  Procedure Laterality Date  . APPENDECTOMY    . FRACTURE SURGERY    . MULTIPLE TOOTH EXTRACTIONS       Allergies  Allergies  Allergen Reactions  . Other     [name] bandage (self-gripping) - erythema, itching    Inpatient Medications    . cefTRIAXone (ROCEPHIN)  IV  1 g Intravenous Q24H  . docusate sodium  100 mg Oral BID  . enoxaparin (LOVENOX) injection  1 mg/kg Subcutaneous Q12H  . feeding supplement (ENSURE ENLIVE)  237 mL Oral 5 X Daily  . hydrocortisone    Rectal BID  . insulin aspart  0-15 Units Subcutaneous TID WC  . metoprolol tartrate  12.5 mg Oral Q8H  . multivitamin with minerals  1 tablet Oral Daily  . sodium chloride flush  3 mL Intravenous Q12H  . tamsulosin  0.4 mg Oral Daily    Family History    Family History  Problem Relation Age of Onset  . Brain cancer Mother   . Alzheimer's disease Father   . Cancer Maternal Grandmother   . Heart attack Maternal Grandfather   . Pneumonia Paternal Grandfather     Social History    Social History   Social History  . Marital status: Unknown    Spouse name: N/A  . Number of children: N/A  . Years of education: N/A   Occupational History  . unemployed    Social History Main Topics  . Smoking status: Former Smoker    Types: Pipe    Quit date: 09/14/1983  . Smokeless tobacco: Never Used  . Alcohol use No  . Drug use: No  . Sexual activity: No   Other Topics Concern  . Not on file   Social History Narrative  . No narrative on file     Review of Systems    General:  No chills, fever, night sweats or weight  changes.  Cardiovascular:  No chest pain, dyspnea on exertion, edema, orthopnea, palpitations, paroxysmal nocturnal dyspnea. Dermatological: No rash, lesions/masses Respiratory: No cough, dyspnea Urologic: No hematuria, dysuria Abdominal:   No nausea, vomiting, diarrhea, bright red blood per rectum, melena, or hematemesis Neurologic:  No visual changes, wkns, changes in mental status. All other systems reviewed and are otherwise negative except as noted above.  Physical Exam    Blood pressure 96/72, pulse (!) 126, temperature 98 F (36.7 C), temperature source Oral, resp. rate 20, height 6\' 2"  (1.88 m), weight 205 lb 14.6 oz (93.4 kg), SpO2 100 %.  General: Anxious male in NAD Psych: Anxious, pressured speech.  Neuro: Alert and oriented X 3. Moves all extremities spontaneously. HEENT: Normal  Neck: Supple without bruits or JVD. Lungs:  Resp regular and  unlabored, CTA. Heart: iRRR no s3, s4, or murmurs. Abdomen: Soft, non-tender, non-distended, BS + x 4.  Extremities: No clubbing, cyanosis or edema. DP/PT/Radials 2+ and equal bilaterally.  Labs     Lab Results  Component Value Date   WBC 4.1 11/12/2016   HGB 10.1 (L) 11/12/2016   HCT 28.7 (L) 11/12/2016   MCV 95.7 11/12/2016   PLT 114 (L) 11/12/2016    Recent Labs Lab 11/12/16 0430  NA 131*  K 4.5  CL 100*  CO2 23  BUN 11  CREATININE 0.61  CALCIUM 7.3*  PROT 4.7*  BILITOT 0.6  ALKPHOS 48  ALT 7*  AST 11*  GLUCOSE 106*    Radiology Studies    Dg Abdomen 1 View  Result Date: 11/08/2016 CLINICAL DATA:  Constipation over the last month. EXAM: ABDOMEN - 1 VIEW COMPARISON:  None. FINDINGS: Gas pattern does not suggest ileus or obstruction. Amount of fecal matter in the colon is within normal limits with the exception of slightly prominent stool in the rectal vault. No significant soft tissue calcifications. Some vascular calcification noted. Chronic degenerative changes affect the spine. IMPRESSION: No markedly abnormal findings. Overall amount of fecal matter appears within normal limits. Slightly prominent stool in the rectal vault. Electronically Signed   By: Nelson Chimes M.D.   On: 11/08/2016 14:29   Ct Head Wo Contrast  Result Date: 11/08/2016 CLINICAL DATA:  Generalized weakness. Poor historian. Difficulty raising right arm. EXAM: CT HEAD WITHOUT CONTRAST TECHNIQUE: Contiguous axial images were obtained from the base of the skull through the vertex without intravenous contrast. COMPARISON:  None. FINDINGS: Brain: Ventricles and cisterns are within normal. There is minimal prominence of the CSF spaces compatible with age related atrophy. There is no mass, mass effect, shift of midline structures or acute hemorrhage. No evidence of acute infarction. Vascular: Minimal calcified plaque over the cavernous segment of the internal carotid arteries. Skull: Within normal.  Sinuses/Orbits: Within normal. IMPRESSION: No acute intracranial findings. Electronically Signed   By: Marin Olp M.D.   On: 11/08/2016 15:56   Ct Abdomen Pelvis W Contrast  Result Date: 11/08/2016 CLINICAL DATA:  Initial evaluation for weakness, weight loss, constipation. EXAM: CT ABDOMEN AND PELVIS WITH CONTRAST TECHNIQUE: Multidetector CT imaging of the abdomen and pelvis was performed using the standard protocol following bolus administration of intravenous contrast. CONTRAST:  157mL ISOVUE-300 IOPAMIDOL (ISOVUE-300) INJECTION 61% COMPARISON:  Prior radiograph from earlier the same day. FINDINGS: Lower chest: Mild scattered atelectatic changes present within the visualized lung bases. Visualized lungs are otherwise clear. No pleural or pericardial fusion. Hepatobiliary: Liver demonstrates a normal contrast enhanced appearance. Gallbladder within normal limits. No biliary dilatation. Pancreas: Pancreas within  normal limits. Spleen: Scattered hypodense lesions noted within the spleen, largest of which measures 9 mm. These are indeterminate, but are of doubtful clinical significance. Spleen otherwise unremarkable. Adrenals/Urinary Tract: 2.3 cm hypodense nodule noted within the right adrenal gland, most likely a small adenoma. Left adrenal gland within normal limits. Kidneys equal in size with symmetric enhancement. Scattered parapelvic cyst noted within the left kidney. Few scattered calcifications within the left renal hilum suspicious for small nonobstructive calculi. These measure up to 3 mm. No hydronephrosis. No focal enhancing renal mass. Scattered cortical thinning/scarring noted. Ureters are of normal caliber without acute abnormality. Bladder within normal limits. Stomach/Bowel: Small hiatal hernia noted. Stomach otherwise unremarkable. Small bowel within normal limits for caliber without evidence for obstruction. Colonic diverticulosis without evidence for acute diverticulitis. Appendix not  visualize, compatible with history of prior appendectomy. No abnormal wall thickening, mucosal enhancement, or inflammatory fat stranding seen about the bowels. Large amount of retained stool within the rectal vault, suggesting constipation. Vascular/Lymphatic: Normal intravascular enhancement seen throughout the intra-abdominal aorta and its branch vessels. Moderate aorto bi-iliac atherosclerotic disease. No aneurysm. No pathologically enlarged intra-abdominal or pelvic lymph nodes. Reproductive: Prostate within normal limits. Other: No free air or fluid. Small bilateral fat containing inguinal hernias noted. Musculoskeletal: No acute osseous abnormality. No worrisome lytic or blastic osseous lesions. Moderate multilevel degenerative spondylolysis noted within the visualized spine. IMPRESSION: 1. No CT evidence for acute intra-abdominal or pelvic process. 2. Large amount of retained stool within the rectal vault, suggesting constipation. 3. Sigmoid diverticulosis without evidence for acute diverticulitis. 4. Question punctate nonobstructive left renal nephrolithiasis. Electronically Signed   By: Jeannine Boga M.D.   On: 11/08/2016 18:08   Dg Abd 2 Views  Result Date: 11/10/2016 CLINICAL DATA:  Acute generalized abdominal pain. EXAM: ABDOMEN - 2 VIEW COMPARISON:  CT scan of November 08, 2016. FINDINGS: The bowel gas pattern is normal. There is no evidence of free air. No radio-opaque calculi or other significant radiographic abnormality is seen. Stool is noted in the rectum. IMPRESSION: No definite evidence of bowel obstruction or ileus. Electronically Signed   By: Marijo Conception, M.D.   On: 11/10/2016 15:53   Dg Colon Therapeutic W/cm  Result Date: 11/12/2016 CLINICAL DATA:  Severe constipation. EXAM: WATER SOLUBLE CONTRAST ENEMA TECHNIQUE: Approximately 3 L of warm water mixed with 480 cc of Gastrografin was instilled per rectum. FLUOROSCOPY TIME:  Radiation Exposure Index (if provided by the  fluoroscopic device): 183.47 mGy COMPARISON:  Radiographs dated 11/10/2016 FINDINGS: EKG knee demonstrates moderate stool in the ascending colon as well as in the rectum. The study shows a 6 mm stool ball in the rectum. There is also stool throughout the remainder of the colon particularly in the cecum. There are few diverticula in the sigmoid portion the colon. The study was suboptimal due to patient's inability to move well during the exam. Multiple smaller stool balls throughout the colon. I was able to get some contrast into the ascending colon. There are no discrete strictures or definitive mass lesions. The patient was unable to sit up to evacuate the contrast at the end of the exam. IMPRESSION: Extensive stool in the distal colon as well as in the ascending colon. No dilatation of the colon. Scattered diverticula in the sigmoid portion of the colon. No discrete obstruction or mass lesion. However, the study is suboptimal for detail because of the patient's inability to retain the contrast or move well during the exam. Electronically Signed   By: Jeneen Rinks  Maxwell M.D.   On: 11/12/2016 12:46    EKG & Cardiac Imaging    EKG: Afib with RVR  Echocardiogram: pending   Assessment & Plan    1. Atrial flutter/Atrial fib: Patient has a history of Afib, looks more like 3:1 flutter when his HR slows down. He is not open to taking any anticoagulation or any other medications for his Afib when he leaves the hospital citing that he is homeless and does not have the money or resources. I told him we may be able to provide assistance with the cost of his meds (through the drug company), but he says he is not going to take any anticoagulation. He says " I know I could have a stroke".   Would add digoxin IV per pharmacy for rate control as we are limited on what we can do by his hypotension. Would not chemically cardiovert him with Amiodarone.   This patients CHA2DS2-VASc Score and unadjusted Ischemic Stroke Rate  (% per year) is equal to 2.2 % stroke rate/year from a score of 2 Above score calculated as 1 point each if present [CHF, HTN, DM, Vascular=MI/PAD/Aortic Plaque, Age if 65-74, or Male], 2 points each if present [Age > 75, or Stroke/TIA/TE]   2. Severe constipation: Treatment per primary team.     Signed, Arbutus Leas, NP 11/12/2016, 5:28 PM Pager: [phone]   I have examined the patient and reviewed assessment and plan and discussed with patient.  Agree with above as stated.  Chronic AFib.  Now he appears to be in flutter.  Rate slowing with Digoxin due to low BP.  He will not agree to long term anticoagulation.  Poor social situation.  He was informed of the risks of not taking anticoagulation.    Normal LV function in 2013.  Cardiology F/u with Dr. Debara Pickett.   Larae Grooms

## 2016-11-13 ENCOUNTER — Inpatient Hospital Stay (HOSPITAL_COMMUNITY): Payer: PPO

## 2016-11-13 DIAGNOSIS — E1169 Type 2 diabetes mellitus with other specified complication: Secondary | ICD-10-CM | POA: Diagnosis not present

## 2016-11-13 DIAGNOSIS — K5641 Fecal impaction: Secondary | ICD-10-CM | POA: Diagnosis not present

## 2016-11-13 DIAGNOSIS — E871 Hypo-osmolality and hyponatremia: Secondary | ICD-10-CM | POA: Diagnosis not present

## 2016-11-13 DIAGNOSIS — R531 Weakness: Secondary | ICD-10-CM

## 2016-11-13 DIAGNOSIS — I482 Chronic atrial fibrillation: Secondary | ICD-10-CM | POA: Diagnosis not present

## 2016-11-13 DIAGNOSIS — I480 Paroxysmal atrial fibrillation: Secondary | ICD-10-CM | POA: Diagnosis not present

## 2016-11-13 DIAGNOSIS — R000 Tachycardia, unspecified: Secondary | ICD-10-CM

## 2016-11-13 DIAGNOSIS — I481 Persistent atrial fibrillation: Secondary | ICD-10-CM | POA: Diagnosis not present

## 2016-11-13 DIAGNOSIS — K5900 Constipation, unspecified: Secondary | ICD-10-CM

## 2016-11-13 DIAGNOSIS — N183 Chronic kidney disease, stage 3 (moderate): Secondary | ICD-10-CM | POA: Diagnosis not present

## 2016-11-13 DIAGNOSIS — R933 Abnormal findings on diagnostic imaging of other parts of digestive tract: Secondary | ICD-10-CM | POA: Diagnosis not present

## 2016-11-13 DIAGNOSIS — R1084 Generalized abdominal pain: Secondary | ICD-10-CM | POA: Diagnosis not present

## 2016-11-13 DIAGNOSIS — N179 Acute kidney failure, unspecified: Secondary | ICD-10-CM | POA: Diagnosis not present

## 2016-11-13 DIAGNOSIS — R109 Unspecified abdominal pain: Secondary | ICD-10-CM | POA: Diagnosis not present

## 2016-11-13 DIAGNOSIS — K5649 Other impaction of intestine: Secondary | ICD-10-CM | POA: Diagnosis not present

## 2016-11-13 DIAGNOSIS — K5901 Slow transit constipation: Secondary | ICD-10-CM | POA: Diagnosis not present

## 2016-11-13 DIAGNOSIS — D120 Benign neoplasm of cecum: Secondary | ICD-10-CM | POA: Diagnosis not present

## 2016-11-13 LAB — ECHOCARDIOGRAM COMPLETE
HEIGHTINCHES: 74 in
WEIGHTICAEL: 3294.55 [oz_av]

## 2016-11-13 LAB — GLUCOSE, CAPILLARY
GLUCOSE-CAPILLARY: 103 mg/dL — AB (ref 65–99)
Glucose-Capillary: 109 mg/dL — ABNORMAL HIGH (ref 65–99)
Glucose-Capillary: 115 mg/dL — ABNORMAL HIGH (ref 65–99)
Glucose-Capillary: 128 mg/dL — ABNORMAL HIGH (ref 65–99)

## 2016-11-13 MED ORDER — LORAZEPAM 1 MG PO TABS
1.0000 mg | ORAL_TABLET | Freq: Three times a day (TID) | ORAL | Status: DC
  Administered 2016-11-13 – 2016-11-16 (×9): 1 mg via ORAL
  Filled 2016-11-13 (×9): qty 1

## 2016-11-13 MED ORDER — BUSPIRONE HCL 5 MG PO TABS
5.0000 mg | ORAL_TABLET | Freq: Three times a day (TID) | ORAL | Status: DC
  Administered 2016-11-13 – 2016-11-26 (×37): 5 mg via ORAL
  Filled 2016-11-13 (×37): qty 1

## 2016-11-13 MED ORDER — PEG 3350-KCL-NA BICARB-NACL 420 G PO SOLR
4000.0000 mL | Freq: Once | ORAL | Status: AC
  Administered 2016-11-13: 4000 mL via ORAL

## 2016-11-13 NOTE — Progress Notes (Signed)
[facility] Progress Note  Chief Complaint:   Constipation, fecal impaction  Subjective: Rectal discomfort since eating breakfast. Large output after bowel purge last night  Objective:  Vital signs in last 24 hours: Temp:  [97.9 F (36.6 C)-98.5 F (36.9 C)] 97.9 F (36.6 C) (12/02 0547) Pulse Rate:  [94-98] 98 (12/02 0547) Resp:  [20] 20 (12/02 0547) BP: (95-108)/(59-64) 108/64 (12/02 0547) SpO2:  [99 %] 99 % (12/02 0547) Last BM Date: 11/13/16 General:   Alert, obese white male in NAD EENT:  Normal hearing, non icteric sclera, conjunctive pink.  Heart:  Regular rate and rhythm; Pulm: Normal respiratory effor Abdomen:  Soft, obese, nondistended, nontender.  Normal bowel sounds, no masses felt. No hepatomegaly.    Neurologic:  Alert and  oriented x4;  grossly normal neurologically. Psych:  Alert and cooperative. Normal mood and affect.  Intake/Output from previous day: 12/01 0701 - 12/02 0700 In: 3865 [P.O.:840; I.V.:2975; IV Piggyback:50] Out: 550 [Urine:550] Intake/Output this shift: Total I/O In: -  Out: 750 [Urine:750]  Lab Results:  Recent Labs  11/12/16 0430  WBC 4.1  HGB 10.1*  HCT 28.7*  PLT 114*   BMET  Recent Labs  11/12/16 0430  NA 131*  K 4.5  CL 100*  CO2 23  GLUCOSE 106*  BUN 11  CREATININE 0.61  CALCIUM 7.3*   LFT  Recent Labs  11/12/16 0430  PROT 4.7*  ALBUMIN 2.3*  AST 11*  ALT 7*  ALKPHOS 48  BILITOT 0.6   Dg Colon Therapeutic W/cm  Result Date: 11/12/2016 CLINICAL DATA:  Severe constipation. EXAM: WATER SOLUBLE CONTRAST ENEMA TECHNIQUE: Approximately 3 L of warm water mixed with 480 cc of Gastrografin was instilled per rectum. FLUOROSCOPY TIME:  Radiation Exposure Index (if provided by the fluoroscopic device): 183.47 mGy COMPARISON:  Radiographs dated 11/10/2016 FINDINGS: EKG knee demonstrates moderate stool in the ascending colon as well as in the rectum. The study shows a 6 mm stool ball in the rectum.  There is also stool throughout the remainder of the colon particularly in the cecum. There are few diverticula in the sigmoid portion the colon. The study was suboptimal due to patient's inability to move well during the exam. Multiple smaller stool balls throughout the colon. I was able to get some contrast into the ascending colon. There are no discrete strictures or definitive mass lesions. The patient was unable to sit up to evacuate the contrast at the end of the exam. IMPRESSION: Extensive stool in the distal colon as well as in the ascending colon. No dilatation of the colon. Scattered diverticula in the sigmoid portion of the colon. No discrete obstruction or mass lesion. However, the study is suboptimal for detail because of the patient's inability to retain the contrast or move well during the exam. Electronically Signed   By: Lorriane Shire M.D.   On: 11/12/2016 12:46    Assessment / Plan:  Severe constipation/fecal impaction. This is his 5th day of bowel evacuation !. He has received SMOG enemas, gastrograffin enema and last night drank a full bowel prep after gastrograffin enema showed persistent moderate stool in cecum / right colon and rectumt. He describes large output of stool after bowel prep last night. Was feeling better until he ate breakfast. Now with recurrent rectal pressure, feels " impacted". Repeated DRE. The stool ball I felt a couple of days ago was gone but now with formed stool more distally. Stool slightly too high for me to disimpact  and feels too soft to do so anyway. He is making progress but still needs purging. Will repeat bowel prep today, this time start if 1/2 Golytely prep, if no response in couple of hours then proceed with remainder of prep. I don't think he will tolerate an enema, it is hard for him to even tolerate digital exam.     LOS: 5 days   Tye Savoy  11/13/2016, 1:30 PM  Pager number [phone]     Attending physician's note   I have taken  an interval history, reviewed the chart and examined the patient. I agree with the Advanced Practitioner's note, impression and recommendations.   Lucio Edward, MD [facility] [phone] Mon-Fri 8a-5p [phone] after 5p, weekends, holidays

## 2016-11-13 NOTE — Progress Notes (Signed)
PT Cancellation Note  Patient Details Name: [name] MRN: [id number] DOB: 1951/04/03   Cancelled Treatment:     PT eval deferred this am at request of nursing 2* ongoing bowel issues.  Will follow.   Chinenye Katzenberger 11/13/2016, 9:53 AM

## 2016-11-13 NOTE — Progress Notes (Signed)
*  PRELIMINARY RESULTS* Echocardiogram 2D Echocardiogram has been performed.  [name] 11/13/2016, 1:51 PM

## 2016-11-13 NOTE — Progress Notes (Signed)
Patient c/o of extreme discomfort on his scrotum and rectal area which is very red, inflamed, sore, tender to touch secondary to freq. stool from the medication  received for his constipation. Continued to apply the scheduled/ PRN creams to sacrum and affected areas.  Patient continues to have multiple stools. Educated about importance of cleaning the skin and applying barrier cream to protect the skin. Will continue to educate and monitor skin.

## 2016-11-13 NOTE — Progress Notes (Signed)
PROGRESS NOTE    [name]  [id number] DOB: Dec 17, 1950 DOA: 11/08/2016 PCP: Sandi Mariscal, MD   Brief Narrative:  65 y.o. male with medical history significant ofA. Fib not on anticoagulation, DM Type 2, HTN, HLD, depression who presents initially with complaints of uncontrolled constipation. Patient admitted to being essentially noncompliant with any of his regimen for the past several years. In the emergency department, patient was noted to have symptomatically hyponatremia and tachycardia. Patient was also mildly hypotensive, receiving IV fluid hydration. Patient also found to have significant stool burden on CT abdomen pelvis. Fecal distraction attempt was without success. Smog enema yielded limited success. The patient was ultimately admitted for further management of multiple medical issues  Assessment & Plan:   Principal Problem:   Constipation Active Problems:   Essential hypertension, benign   Atrial fibrillation (HCC)   Diabetes mellitus type 2 in obese (HCC)   Hyponatremia   Urinary tract infection without hematuria   Hypokalemia   CKD (chronic kidney disease) stage 3, GFR 30-59 ml/min   Hyperbilirubinemia   Homelessness   Protein-calorie malnutrition, severe   Impaction of colon (HCC)   Abdominal pain  Constipation -Patient with history of chronic constipation (approx 2 months of ongoing constipation) -Failed manual disimpaction in the emergency department -has experienced multiple BM's after repeated enemas; even he still feel obstructed. -never has had colonoscopy before -GI consulted, for potential sigmoidoscopy vs colonoscopy; currently plan will be for gastrograffin enema (looking to help with purge of the colon and assist r/o a mass).  -received bowel prep overnight and had multiple BM's; per GI planning another prep today  -will continue supportive care  HTN -Patient is continued on IV fluid hydration -Essentially has been untreated for several years  secondary to lack of compliance -will monitor and adjust  antihypertensive agents as needed  -currently with soft to low BP while on metoprolol   Afib with rapid ventricular rate -Previously on Eliquis -CHA2DS2-VASc score is 2-3, with a 3.2% stroke risk per year -patient declining use of any anticoagulation  -will continue metoprolol -appreciate cardiology input and rec's. Plan is for digoxin along with metoprolol.  -HR improved and much controlled now -follow electrolytes and correct them as needed  -2-D echo pending   DM -Essentially has not been taking any medications for several years -Hemoglobin A1c is 5.2 -Continue sliding scale insulin while inpatient   Hyponatremia -Improved with IV fluid resuscitation -Repeat BMET to follow Na level -Continue to encourage proper by mouth intake  UTI -Reports dysuria -continue rocephin (will treat for 7 days total); on day 6/7 -Urine culture ordered, results demonstrating multiple microorganism  -No leukocytosis. No fevers  Hypokalemia -Mild, K+ 3.3 at presentation -Potassium included in IV fluids -follow trend and replete as needed   CKD -Creatinine improved to 1.04 from 1.36 with IV fluids -Repeat basic metabolic panel to renal function trend -appears to have stage 2-3 at baseline -dehydration/pre-renal azotemia provoking AKI  Urinary retention -acute -given need for > 2 In and out Cath and bladder scan with >365ml -patient started on flomax and foley inserted -will attempt voiding trial in am  Hyperbilirubinemia -Uncertain etiology -Bilirubin was 2.1; WNL now -Urine drug screen negative, alcohol levels are negative  Homelessness -SW consult for possible assistance with placement  Severe protein calorie malnutrition  -will follow nutritional service rec's -feeding supplements and encouragement to increase PO intake provided  DVT prophylaxis: Therapeutic Lovenox Code Status: Full code Family Communication:  Patient in room, family not at  bedside Disposition Plan: will stabilize patient condition and follow SW/CM assistance for safe discharge  Consultants:   GI  Procedures:   See below for x-ray reports   Antimicrobials: Anti-infectives    Start     Dose/Rate Route Frequency Ordered Stop   11/10/16 2000  cefTRIAXone (ROCEPHIN) 1 g in dextrose 5 % 50 mL IVPB     1 g 100 mL/hr over 30 Minutes Intravenous Every 24 hours 11/10/16 1847     11/09/16 2000  cefTRIAXone (ROCEPHIN) 2 g in dextrose 5 % 50 mL IVPB  Status:  Discontinued     2 g 100 mL/hr over 30 Minutes Intravenous Every 24 hours 11/09/16 0312 11/10/16 1847   11/08/16 2045  cefTRIAXone (ROCEPHIN) 2 g in dextrose 5 % 50 mL IVPB     2 g 100 mL/hr over 30 Minutes Intravenous  Once 11/08/16 2042 11/08/16 2229      Subjective: Complaining of loose stool incontinence, rectum pain and bloating/constipation feeling.   Objective: Vitals:   11/12/16 1321 11/12/16 2105 11/13/16 0547 11/13/16 1345  BP: 96/72 (!) 95/59 108/64 107/61  Pulse: (!) 126 94 98 (!) 116  Resp: 20 20 20 18   Temp: 98 F (36.7 C) 98.5 F (36.9 C) 97.9 F (36.6 C) 98.4 F (36.9 C)  TempSrc: Oral Oral Oral Oral  SpO2: 100% 99% 99% 97%  Weight:      Height:        Intake/Output Summary (Last 24 hours) at 11/13/16 1404 Last data filed at 11/13/16 [id number]  Gross per 24 hour  Intake             2010 ml  Output             1200 ml  Net              810 ml   Filed Weights   11/09/16 2012  Weight: 93.4 kg (205 lb 14.6 oz)    Examination: General exam: Afebrile, reports feeling somewhat better today and willing to advance his diet more. Reports still sensation of bloating and constipation and continue moving his bowel.  Respiratory system: Clear to auscultation. Respiratory effort normal. Cardiovascular system: S1 & S2 heard, Tachycardic Gastrointestinal system: Abdomen is mildly distended, soft and w/o specific tenderness point (kind of general discomfort  on deep palpation). No organomegaly or masses felt. Normal bowel sounds heard. Central nervous system: Alert and oriented. No focal neurological deficits. Extremities: Symmetric 5 x 5 power. Skin: No rashes, lesions Psychiatry: Judgement and insight appear normal. Mood & affect appropriate. With intermittent rapid mood swings   Data Reviewed: I have personally reviewed following labs and imaging studies  CBC:  Recent Labs Lab 11/08/16 1411 11/09/16 0301 11/12/16 0430  WBC 6.6 6.0 4.1  NEUTROABS 5.5  --   --   HGB 13.6 12.9* 10.1*  HCT 38.0* 35.5* 28.7*  MCV 91.1 91.7 95.7  PLT 163 134* [id number]*   Basic Metabolic Panel:  Recent Labs Lab 11/08/16 1411 11/09/16 0301 11/10/16 0520 11/12/16 0430  NA 128* 131* 133* 131*  K 3.3* 3.3* 3.7 4.5  CL 89* 96* 103 100*  CO2 24 23 20* 23  GLUCOSE 130* 122* 130* 106*  BUN 36* 31* 20 11  CREATININE 1.36* 1.04 0.83 0.61  CALCIUM 9.4 8.4* 7.7* 7.3*  MG  --  1.7 1.8  --    GFR: Estimated Creatinine Clearance: 107 mL/min (by C-G formula based on SCr of 0.61 mg/dL).   Liver Function Tests:  Recent Labs Lab 11/08/16 1411 11/12/16 0430  AST 17 11*  ALT 11* 7*  ALKPHOS 76 48  BILITOT 2.1* 0.6  PROT 7.7 4.7*  ALBUMIN 4.2 2.3*    Recent Labs Lab 11/08/16 1426  LIPASE 25   HbA1C: No results for input(s): HGBA1C in the last 72 hours. CBG:  Recent Labs Lab 11/12/16 1220 11/12/16 1723 11/12/16 2102 11/13/16 0758 11/13/16 1156  GLUCAP 105* 131* 109* 103* 109*   Sepsis Labs:  Recent Labs Lab 11/08/16 2239 11/09/16 0244  LATICACIDVEN 3.63* 1.40    Recent Results (from the past 240 hour(s))  Urine culture     Status: Abnormal   Collection Time: 11/08/16  7:47 PM  Result Value Ref Range Status   Specimen Description URINE, CLEAN CATCH  Final   Special Requests NONE  Final   Culture MULTIPLE SPECIES PRESENT, SUGGEST RECOLLECTION (A)  Final   Report Status 11/10/2016 FINAL  Final     Radiology Studies: Dg Colon  Therapeutic W/cm  Result Date: 11/12/2016 CLINICAL DATA:  Severe constipation. EXAM: WATER SOLUBLE CONTRAST ENEMA TECHNIQUE: Approximately 3 L of warm water mixed with 480 cc of Gastrografin was instilled per rectum. FLUOROSCOPY TIME:  Radiation Exposure Index (if provided by the fluoroscopic device): 183.47 mGy COMPARISON:  Radiographs dated 11/10/2016 FINDINGS: EKG knee demonstrates moderate stool in the ascending colon as well as in the rectum. The study shows a 6 mm stool ball in the rectum. There is also stool throughout the remainder of the colon particularly in the cecum. There are few diverticula in the sigmoid portion the colon. The study was suboptimal due to patient's inability to move well during the exam. Multiple smaller stool balls throughout the colon. I was able to get some contrast into the ascending colon. There are no discrete strictures or definitive mass lesions. The patient was unable to sit up to evacuate the contrast at the end of the exam. IMPRESSION: Extensive stool in the distal colon as well as in the ascending colon. No dilatation of the colon. Scattered diverticula in the sigmoid portion of the colon. No discrete obstruction or mass lesion. However, the study is suboptimal for detail because of the patient's inability to retain the contrast or move well during the exam. Electronically Signed   By: Lorriane Shire M.D.   On: 11/12/2016 12:46    Scheduled Meds: . busPIRone  5 mg Oral TID  . cefTRIAXone (ROCEPHIN)  IV  1 g Intravenous Q24H  . digoxin  0.125 mg Oral Daily  . docusate sodium  100 mg Oral BID  . enoxaparin (LOVENOX) injection  1 mg/kg Subcutaneous Q12H  . feeding supplement (ENSURE ENLIVE)  237 mL Oral 5 X Daily  . hydrocortisone   Rectal BID  . insulin aspart  0-15 Units Subcutaneous TID WC  . LORazepam  1 mg Oral Q8H  . metoprolol tartrate  12.5 mg Oral Q8H  . multivitamin with minerals  1 tablet Oral Daily  . polyethylene glycol-electrolytes  4,000 mL Oral  Once  . sodium chloride flush  3 mL Intravenous Q12H  . tamsulosin  0.4 mg Oral Daily   Continuous Infusions: . 0.9 % NaCl with KCl 20 mEq / L 100 mL/hr at 11/12/16 1607     LOS: 5 days   Barton Dubois, MD [facility] Pager [phone]  If 7PM-7AM, please contact night-coverage [email or web address] Password [email or web address] 11/13/2016, 2:04 PM

## 2016-11-13 NOTE — Progress Notes (Signed)
Subjective: Breatihng is OK  NO CP   Objective: Vitals:   11/12/16 0646 11/12/16 1321 11/12/16 2105 11/13/16 0547  BP: 90/63 96/72 (!) 95/59 108/64  Pulse:  (!) 126 94 98  Resp:  20 20 20   Temp:  98 F (36.7 C) 98.5 F (36.9 C) 97.9 F (36.6 C)  TempSrc:  Oral Oral Oral  SpO2:  100% 99% 99%  Weight:      Height:       Weight change:   Intake/Output Summary (Last 24 hours) at 11/13/16 0941 Last data filed at 11/13/16 0600  Gross per 24 hour  Intake          2873.33 ml  Output              550 ml  Net          2323.33 ml   I/O  Over 9 L positive   General: Alert, awake, oriented x3, in no acute distress Neck:  JVP is normal Heart: Regular rate and rhythm, without murmurs, rubs, gallops.  Lungs: Clear to auscultation.  No rales or wheezes. Exemities:  No edema.   Neuro: Grossly intact, nonfocal.  Tele:  Afi  80s   Lab Results: Results for orders placed or performed during the hospital encounter of 11/08/16 (from the past 24 hour(s))  Glucose, capillary     Status: Abnormal   Collection Time: 11/12/16 12:20 PM  Result Value Ref Range   Glucose-Capillary 105 (H) 65 - 99 mg/dL   Comment 1 Notify RN    Comment 2 Document in Chart   Glucose, capillary     Status: Abnormal   Collection Time: 11/12/16  5:23 PM  Result Value Ref Range   Glucose-Capillary 131 (H) 65 - 99 mg/dL  Glucose, capillary     Status: Abnormal   Collection Time: 11/12/16  9:02 PM  Result Value Ref Range   Glucose-Capillary 109 (H) 65 - 99 mg/dL  Glucose, capillary     Status: Abnormal   Collection Time: 11/13/16  7:58 AM  Result Value Ref Range   Glucose-Capillary 103 (H) 65 - 99 mg/dL    Studies/Results: Dg Colon Therapeutic W/cm  Result Date: 11/12/2016 CLINICAL DATA:  Severe constipation. EXAM: WATER SOLUBLE CONTRAST ENEMA TECHNIQUE: Approximately 3 L of warm water mixed with 480 cc of Gastrografin was instilled per rectum. FLUOROSCOPY TIME:  Radiation Exposure Index (if provided by the  fluoroscopic device): 183.47 mGy COMPARISON:  Radiographs dated 11/10/2016 FINDINGS: EKG knee demonstrates moderate stool in the ascending colon as well as in the rectum. The study shows a 6 mm stool ball in the rectum. There is also stool throughout the remainder of the colon particularly in the cecum. There are few diverticula in the sigmoid portion the colon. The study was suboptimal due to patient's inability to move well during the exam. Multiple smaller stool balls throughout the colon. I was able to get some contrast into the ascending colon. There are no discrete strictures or definitive mass lesions. The patient was unable to sit up to evacuate the contrast at the end of the exam. IMPRESSION: Extensive stool in the distal colon as well as in the ascending colon. No dilatation of the colon. Scattered diverticula in the sigmoid portion of the colon. No discrete obstruction or mass lesion. However, the study is suboptimal for detail because of the patient's inability to retain the contrast or move well during the exam. Electronically Signed   By: Dina Rich.D.  On: 11/12/2016 12:46    Medications:REveiwed     @PROBHOSP @  1  Atrial flutter/ atrial fib   Rates controlled  Pt refuses anticoagulatoin    Currently has received signif IVF  No evid of volume overload  Watch closely  WIll sign off  Please call with questions.      LOS: 5 days   Dorris Carnes 11/13/2016, 9:41 AM

## 2016-11-14 DIAGNOSIS — E1169 Type 2 diabetes mellitus with other specified complication: Secondary | ICD-10-CM | POA: Diagnosis not present

## 2016-11-14 DIAGNOSIS — N183 Chronic kidney disease, stage 3 (moderate): Secondary | ICD-10-CM | POA: Diagnosis not present

## 2016-11-14 DIAGNOSIS — K5901 Slow transit constipation: Secondary | ICD-10-CM | POA: Diagnosis not present

## 2016-11-14 DIAGNOSIS — N3000 Acute cystitis without hematuria: Secondary | ICD-10-CM

## 2016-11-14 DIAGNOSIS — R109 Unspecified abdominal pain: Secondary | ICD-10-CM | POA: Diagnosis not present

## 2016-11-14 DIAGNOSIS — I482 Chronic atrial fibrillation: Secondary | ICD-10-CM | POA: Diagnosis not present

## 2016-11-14 DIAGNOSIS — K5649 Other impaction of intestine: Secondary | ICD-10-CM | POA: Diagnosis not present

## 2016-11-14 DIAGNOSIS — N179 Acute kidney failure, unspecified: Secondary | ICD-10-CM | POA: Diagnosis not present

## 2016-11-14 DIAGNOSIS — K5641 Fecal impaction: Secondary | ICD-10-CM | POA: Diagnosis not present

## 2016-11-14 DIAGNOSIS — I481 Persistent atrial fibrillation: Secondary | ICD-10-CM | POA: Diagnosis not present

## 2016-11-14 DIAGNOSIS — K5900 Constipation, unspecified: Secondary | ICD-10-CM | POA: Diagnosis not present

## 2016-11-14 DIAGNOSIS — R933 Abnormal findings on diagnostic imaging of other parts of digestive tract: Secondary | ICD-10-CM | POA: Diagnosis not present

## 2016-11-14 DIAGNOSIS — R1084 Generalized abdominal pain: Secondary | ICD-10-CM | POA: Diagnosis not present

## 2016-11-14 DIAGNOSIS — E871 Hypo-osmolality and hyponatremia: Secondary | ICD-10-CM | POA: Diagnosis not present

## 2016-11-14 DIAGNOSIS — I480 Paroxysmal atrial fibrillation: Secondary | ICD-10-CM | POA: Diagnosis not present

## 2016-11-14 DIAGNOSIS — D120 Benign neoplasm of cecum: Secondary | ICD-10-CM | POA: Diagnosis not present

## 2016-11-14 LAB — CBC
HCT: 26.9 % — ABNORMAL LOW (ref 39.0–52.0)
Hemoglobin: 9.2 g/dL — ABNORMAL LOW (ref 13.0–17.0)
MCH: 33.5 pg (ref 26.0–34.0)
MCHC: 34.2 g/dL (ref 30.0–36.0)
MCV: 97.8 fL (ref 78.0–100.0)
PLATELETS: 131 10*3/uL — AB (ref 150–400)
RBC: 2.75 MIL/uL — ABNORMAL LOW (ref 4.22–5.81)
RDW: 14.9 % (ref 11.5–15.5)
WBC: 4 10*3/uL (ref 4.0–10.5)

## 2016-11-14 LAB — BASIC METABOLIC PANEL
ANION GAP: 7 (ref 5–15)
BUN: 9 mg/dL (ref 6–20)
CALCIUM: 7.4 mg/dL — AB (ref 8.9–10.3)
CO2: 25 mmol/L (ref 22–32)
Chloride: 101 mmol/L (ref 101–111)
Creatinine, Ser: 0.55 mg/dL — ABNORMAL LOW (ref 0.61–1.24)
Glucose, Bld: 109 mg/dL — ABNORMAL HIGH (ref 65–99)
Potassium: 4.6 mmol/L (ref 3.5–5.1)
Sodium: 133 mmol/L — ABNORMAL LOW (ref 135–145)

## 2016-11-14 LAB — GLUCOSE, CAPILLARY
GLUCOSE-CAPILLARY: 115 mg/dL — AB (ref 65–99)
GLUCOSE-CAPILLARY: 111 mg/dL — AB (ref 65–99)
Glucose-Capillary: 135 mg/dL — ABNORMAL HIGH (ref 65–99)
Glucose-Capillary: 120 mg/dL — ABNORMAL HIGH (ref 65–99)

## 2016-11-14 MED ORDER — METOPROLOL TARTRATE 25 MG PO TABS
25.0000 mg | ORAL_TABLET | Freq: Two times a day (BID) | ORAL | Status: DC
  Administered 2016-11-14 – 2016-11-16 (×3): 25 mg via ORAL
  Filled 2016-11-14 (×4): qty 1

## 2016-11-14 NOTE — Progress Notes (Signed)
PROGRESS NOTE    [name]  [id number] DOB: 02/03/1951 DOA: 11/08/2016 PCP: Sandi Mariscal, MD   Brief Narrative:  65 y.o. male with medical history significant ofA. Fib not on anticoagulation, DM Type 2, HTN, HLD, depression who presents initially with complaints of uncontrolled constipation. Patient admitted to being essentially noncompliant with any of his regimen for the past several years. In the emergency department, patient was noted to have symptomatically hyponatremia and tachycardia. Patient was also mildly hypotensive, receiving IV fluid hydration. Patient also found to have significant stool burden on CT abdomen pelvis. Fecal distraction attempt was without success. Smog enema yielded limited success. The patient was ultimately admitted for further management of multiple medical issues  Assessment & Plan:   Principal Problem:   Constipation Active Problems:   Essential hypertension, benign   Atrial fibrillation (HCC)   Diabetes mellitus type 2 in obese (HCC)   Hyponatremia   Urinary tract infection without hematuria   Hypokalemia   CKD (chronic kidney disease) stage 3, GFR 30-59 ml/min   Hyperbilirubinemia   Homelessness   Protein-calorie malnutrition, severe   Impaction of colon (HCC)   Abdominal pain   Generalized weakness   Fecal impaction (HCC)  Constipation -Patient with history of chronic constipation (approx 2 months of ongoing constipation) -Failed manual disimpaction in the emergency department -has experienced multiple BM's after repeated enemas; even he still feel obstructed. -never has had colonoscopy before -GI consulted, for potential sigmoidoscopy vs colonoscopy; received bowel prep overnight and had multiple BM's (but with concerns that he is still not well prep); per GI planning barium enema in am -will continue supportive care  HTN -Patient is continued on IV fluid hydration -Essentially has been untreated for several years secondary to lack of  compliance -will monitor and adjust  antihypertensive agents as needed  -currently with soft to low BP while on metoprolol   Afib with rapid ventricular rate -Previously on Eliquis -CHA2DS2-VASc score is 2-3, with a 3.2% stroke risk per year -patient declining use of any anticoagulation  -will continue metoprolol -appreciate cardiology input and rec's. Plan is for digoxin along with metoprolol.  -HR improved and much controlled now -follow electrolytes and correct them as needed  -2-D echo unable to be performed   DM -Essentially has not been taking any medications for several years -Hemoglobin A1c is 5.2 -Continue sliding scale insulin while inpatient   Hyponatremia -Improved with IV fluid resuscitation -Repeat BMET to follow Na level -Continue to encourage proper by mouth intake  UTI -Reports dysuria -continue rocephin (will treat for 7 days total); on day 7/7 -Urine culture ordered, results demonstrating multiple microorganism  -No leukocytosis. No fevers  Hypokalemia -Mild, K+ 3.3 at presentation -Potassium included in IV fluids -follow trend and replete as needed   Acute on chronic renal failure (stage 2-3 at baseline) -Creatinine improved to 0.55 from 1.36 with IV fluids -Repeat basic metabolic panel intermittently to renal function trend -appears to have stage 2-3 at baseline -dehydration/pre-renal azotemia provoking AKI  Urinary retention -acute -given need for > 2 In and out Cath and bladder scan with >367ml -patient started on flomax and foley inserted -will attempt voiding trial in am  Hyperbilirubinemia -Uncertain etiology -Bilirubin was 2.1; WNL now -Urine drug screen negative, alcohol levels are negative  Homelessness -SW consult for possible assistance with placement  Severe protein calorie malnutrition  -will follow nutritional service rec's -feeding supplements and encouragement to increase PO intake provided  DVT prophylaxis:  Therapeutic Lovenox Code Status:  Full code Family Communication: Patient in room, family not at bedside Disposition Plan: will stabilize patient condition and follow SW/CM assistance for safe discharge  Consultants:   GI  Procedures:   See below for x-ray reports   Antimicrobials: Anti-infectives    Start     Dose/Rate Route Frequency Ordered Stop   11/10/16 2000  cefTRIAXone (ROCEPHIN) 1 g in dextrose 5 % 50 mL IVPB     1 g 100 mL/hr over 30 Minutes Intravenous Every 24 hours 11/10/16 1847     11/09/16 2000  cefTRIAXone (ROCEPHIN) 2 g in dextrose 5 % 50 mL IVPB  Status:  Discontinued     2 g 100 mL/hr over 30 Minutes Intravenous Every 24 hours 11/09/16 0312 11/10/16 1847   11/08/16 2045  cefTRIAXone (ROCEPHIN) 2 g in dextrose 5 % 50 mL IVPB     2 g 100 mL/hr over 30 Minutes Intravenous  Once 11/08/16 2042 11/08/16 2229      Subjective: Complaining of loose stool incontinence, more abdominal pain overnight and now with excoriated skin around scrotum, sacrum and anal area.    Objective: Vitals:   11/13/16 2134 11/14/16 0612 11/14/16 1002 11/14/16 1300  BP: 92/75 (!) 126/93  121/75  Pulse: (!) 136 99 (!) 137 (!) 109  Resp: 20 18  18   Temp: 98.4 F (36.9 C) 98.1 F (36.7 C)  98.6 F (37 C)  TempSrc: Oral Oral  Oral  SpO2: 100% 98%  99%  Weight:      Height:        Intake/Output Summary (Last 24 hours) at 11/14/16 1511 Last data filed at 11/14/16 1000  Gross per 24 hour  Intake             1540 ml  Output             2415 ml  Net             -875 ml   Filed Weights   11/09/16 2012  Weight: 93.4 kg (205 lb 14.6 oz)    Examination: General exam: Afebrile, reports feeling ok; had a rough night, with multiple abdominal pain and feeling constipated. Patient with excoriated skin in his scrotum, anal and sacrum area. Rectal tube place (as he was having ongoing stool incontinence) Respiratory system: Clear to auscultation. Respiratory effort normal. Cardiovascular  system: S1 & S2 heard, Tachycardic Gastrointestinal system: Abdomen is mildly distended, soft and w/o specific tenderness point (kind of general discomfort on deep palpation). No organomegaly or masses felt. Normal bowel sounds heard. Central nervous system: Alert and oriented. No focal neurological deficits. Extremities: Symmetric 5 x 5 power. Skin: No open ulcers, excoriated skin around rectum, sacral and scrotum area. Psychiatry: Judgement and insight appear normal. Mood & affect appropriate. With intermittent rapid mood swings   Data Reviewed: I have personally reviewed following labs and imaging studies  CBC:  Recent Labs Lab 11/08/16 1411 11/09/16 0301 11/12/16 0430 11/14/16 0505  WBC 6.6 6.0 4.1 4.0  NEUTROABS 5.5  --   --   --   HGB 13.6 12.9* 10.1* 9.2*  HCT 38.0* 35.5* 28.7* 26.9*  MCV 91.1 91.7 95.7 97.8  PLT 163 134* 114* [id number]*   Basic Metabolic Panel:  Recent Labs Lab 11/08/16 1411 11/09/16 0301 11/10/16 0520 11/12/16 0430 11/14/16 0505  NA 128* 131* 133* 131* 133*  K 3.3* 3.3* 3.7 4.5 4.6  CL 89* 96* 103 100* 101  CO2 24 23 20* 23 25  GLUCOSE 130* 122*  130* 106* 109*  BUN 36* 31* 20 11 9   CREATININE 1.36* 1.04 0.83 0.61 0.55*  CALCIUM 9.4 8.4* 7.7* 7.3* 7.4*  MG  --  1.7 1.8  --   --    GFR: Estimated Creatinine Clearance: 107 mL/min (by C-G formula based on SCr of 0.55 mg/dL (L)).   Liver Function Tests:  Recent Labs Lab 11/08/16 1411 11/12/16 0430  AST 17 11*  ALT 11* 7*  ALKPHOS 76 48  BILITOT 2.1* 0.6  PROT 7.7 4.7*  ALBUMIN 4.2 2.3*    Recent Labs Lab 11/08/16 1426  LIPASE 25   HbA1C: No results for input(s): HGBA1C in the last 72 hours. CBG:  Recent Labs Lab 11/13/16 1156 11/13/16 1700 11/13/16 2132 11/14/16 0807 11/14/16 1134  GLUCAP 109* 115* 128* 115* 135*   Sepsis Labs:  Recent Labs Lab 11/08/16 2239 11/09/16 0244  LATICACIDVEN 3.63* 1.40    Recent Results (from the past 240 hour(s))  Urine culture      Status: Abnormal   Collection Time: 11/08/16  7:47 PM  Result Value Ref Range Status   Specimen Description URINE, CLEAN CATCH  Final   Special Requests NONE  Final   Culture MULTIPLE SPECIES PRESENT, SUGGEST RECOLLECTION (A)  Final   Report Status 11/10/2016 FINAL  Final     Radiology Studies: No results found.  Scheduled Meds: . busPIRone  5 mg Oral TID  . cefTRIAXone (ROCEPHIN)  IV  1 g Intravenous Q24H  . digoxin  0.125 mg Oral Daily  . docusate sodium  100 mg Oral BID  . enoxaparin (LOVENOX) injection  1 mg/kg Subcutaneous Q12H  . feeding supplement (ENSURE ENLIVE)  237 mL Oral 5 X Daily  . hydrocortisone   Rectal BID  . insulin aspart  0-15 Units Subcutaneous TID WC  . LORazepam  1 mg Oral Q8H  . metoprolol tartrate  25 mg Oral BID  . multivitamin with minerals  1 tablet Oral Daily  . sodium chloride flush  3 mL Intravenous Q12H  . tamsulosin  0.4 mg Oral Daily   Continuous Infusions: . 0.9 % NaCl with KCl 20 mEq / L 100 mL/hr at 11/14/16 1440     LOS: 6 days   Barton Dubois, MD [facility] Pager [phone]  If 7PM-7AM, please contact night-coverage [email or web address] Password [email or web address] 11/14/2016, 3:11 PM

## 2016-11-14 NOTE — Progress Notes (Signed)
Got a call from radiology that they are not able to perform the colon X-ray with today but it can be done on Monday, Nevin Bloodgood Guenther-NP notified and stated its okay to do it on  Monday-12/4.

## 2016-11-14 NOTE — Progress Notes (Signed)
     [facility] Progress Note  Chief Complaint:   Constipation/fecal impaction  Subjective: upset about volume of golytely but was able to complete it. Doesn't want any further bowel preps or enemas today - feels he needs a break from it.   Objective:  Vital signs in last 24 hours: Temp:  [98.1 F (36.7 C)-98.4 F (36.9 C)] 98.1 F (36.7 C) (12/03 0612) Pulse Rate:  [99-137] 137 (12/03 1002) Resp:  [18-20] 18 (12/03 0612) BP: (92-126)/(61-93) 126/93 (12/03 0612) SpO2:  [97 %-100 %] 98 % (12/03 0612) Last BM Date: 11/14/16 General:   Alert, obese white male in NAD EENT:  Normal hearing, non icteric sclera, conjunctive pink.  Heart:  Regular rate and rhythm, ; no murmurs. Abdomen:  Soft, nondistended, nontender.  Normal bowel sounds, no masses felt. .    Neurologic:  Alert and  oriented x4;  grossly normal neurologically. Psych:  Alert and cooperative. Normal mood and affect. Skin:   Intake/Output from previous day: 12/02 0701 - 12/03 0700 In: 1300 [I.V.:1300] Out: 2465 [Urine:2350; Stool:115] Intake/Output this shift: Total I/O In: 240 [P.O.:240] Out: 700 [Urine:700]  Lab Results:  Recent Labs  11/12/16 0430 11/14/16 0505  WBC 4.1 4.0  HGB 10.1* 9.2*  HCT 28.7* 26.9*  PLT 114* 131*   BMET  Recent Labs  11/12/16 0430 11/14/16 0505  NA 131* 133*  K 4.5 4.6  CL 100* 101  CO2 23 25  GLUCOSE 106* 109*  BUN 11 9  CREATININE 0.61 0.55*  CALCIUM 7.3* 7.4*   LFT  Recent Labs  11/12/16 0430  PROT 4.7*  ALBUMIN 2.3*  AST 11*  ALT 7*  ALKPHOS 48  BILITOT 0.6    Assessment / Plan:  Severe constipation / fecal impaction.  Overall he has had a SMOG enema, moviprep, gastrograffin enema and completed Golytely last night. A flexiseal was placed because of perineal irritation. Patient still complains of feeling "obstructed" though he did have soft and liquid stool with golytely. He needs a barium enema now to rule out any gross colonic lesions.  If lesion present then needs colonoscopy. Hopefully there is no remaining stool in colon this time. Not sure we can get a barium enema done today but he needs to be on clear liquids in the interim. Flexiseal can be removed since not getting anymore bowel prep today and he should be encouraged to ambulate to bathroom unless medical reasons preclude it.    LOS: 6 days   Tye Savoy  11/14/2016, 1:18 PM  Pager number [phone]     Attending physician's note   I have taken an interval history, reviewed the chart and examined the patient. I agree with the Advanced Practitioner's note, impression and recommendations. Resume clear liquid diet. ACBE to evaluate for colonic lesions.   Lucio Edward, MD [facility] [phone] Mon-Fri 8a-5p [phone] after 5p, weekends, holidays

## 2016-11-14 NOTE — Progress Notes (Signed)
Patient continues to have loose watery stool. Sacrum is extremely excoriated and red.Recommended Flexi seal after education given about the use of Flexi seal. and patient agreed. Made oncall Sophia, PA aware of request at this time. Will continue to monitor.

## 2016-11-14 NOTE — Progress Notes (Addendum)
Orders received to insert flexi seal (rectal tube). Ordered carried out. Inserted rectal tube. Patient tolerated insertion. Immediately after insertion/placement  Noted watery brown stool in tubing  totaling 15 ml in drainage bag.

## 2016-11-14 NOTE — Progress Notes (Signed)
PT Cancellation Note  Patient Details Name: [name] MRN: [id number] DOB: 1951-08-17   Cancelled Treatment:     Spoke with patient, encouraged movement even if we can only do a little or EOB. Pt discussed how he is in pain in abdomen, around genitall area, and bottom. Flexiseal in place now as well. Pt stated he did not have a good night, and realizes he should move but unable to today. We will check back later.    Clide Dales 11/14/2016, 12:28 PM  Clide Dales, PT Pager: [phone] 11/14/2016

## 2016-11-14 NOTE — Progress Notes (Signed)
No new output noted via flexiseal since the start of my shift this AM,  MD-DR Dyann Kief ordered removal of tube patient refused, explained to patient that has not been any output but he stated he does not want it out until he talk to the MD in the morning.

## 2016-11-15 DIAGNOSIS — R1084 Generalized abdominal pain: Secondary | ICD-10-CM | POA: Diagnosis not present

## 2016-11-15 DIAGNOSIS — K5641 Fecal impaction: Secondary | ICD-10-CM | POA: Diagnosis not present

## 2016-11-15 DIAGNOSIS — K5649 Other impaction of intestine: Secondary | ICD-10-CM | POA: Diagnosis not present

## 2016-11-15 DIAGNOSIS — D120 Benign neoplasm of cecum: Secondary | ICD-10-CM | POA: Diagnosis not present

## 2016-11-15 DIAGNOSIS — I481 Persistent atrial fibrillation: Secondary | ICD-10-CM | POA: Diagnosis not present

## 2016-11-15 DIAGNOSIS — E871 Hypo-osmolality and hyponatremia: Secondary | ICD-10-CM | POA: Diagnosis not present

## 2016-11-15 DIAGNOSIS — R933 Abnormal findings on diagnostic imaging of other parts of digestive tract: Secondary | ICD-10-CM | POA: Diagnosis not present

## 2016-11-15 DIAGNOSIS — K5900 Constipation, unspecified: Secondary | ICD-10-CM | POA: Diagnosis not present

## 2016-11-15 DIAGNOSIS — N179 Acute kidney failure, unspecified: Secondary | ICD-10-CM | POA: Diagnosis not present

## 2016-11-15 DIAGNOSIS — K5901 Slow transit constipation: Secondary | ICD-10-CM | POA: Diagnosis not present

## 2016-11-15 DIAGNOSIS — E1169 Type 2 diabetes mellitus with other specified complication: Secondary | ICD-10-CM | POA: Diagnosis not present

## 2016-11-15 DIAGNOSIS — R109 Unspecified abdominal pain: Secondary | ICD-10-CM | POA: Diagnosis not present

## 2016-11-15 DIAGNOSIS — I482 Chronic atrial fibrillation: Secondary | ICD-10-CM | POA: Diagnosis not present

## 2016-11-15 DIAGNOSIS — N183 Chronic kidney disease, stage 3 (moderate): Secondary | ICD-10-CM | POA: Diagnosis not present

## 2016-11-15 DIAGNOSIS — I480 Paroxysmal atrial fibrillation: Secondary | ICD-10-CM | POA: Diagnosis not present

## 2016-11-15 LAB — GLUCOSE, CAPILLARY
GLUCOSE-CAPILLARY: 122 mg/dL — AB (ref 65–99)
GLUCOSE-CAPILLARY: 127 mg/dL — AB (ref 65–99)
Glucose-Capillary: 111 mg/dL — ABNORMAL HIGH (ref 65–99)
Glucose-Capillary: 153 mg/dL — ABNORMAL HIGH (ref 65–99)

## 2016-11-15 MED ORDER — PEG-KCL-NACL-NASULF-NA ASC-C 100 G PO SOLR
0.5000 | Freq: Once | ORAL | Status: AC
  Administered 2016-11-15: 100 g via ORAL
  Filled 2016-11-15: qty 1

## 2016-11-15 MED ORDER — PEG-KCL-NACL-NASULF-NA ASC-C 100 G PO SOLR: 1.0000 | Freq: Once | ORAL | Status: DC

## 2016-11-15 MED ORDER — PEG-KCL-NACL-NASULF-NA ASC-C 100 G PO SOLR
0.5000 | Freq: Once | ORAL | Status: AC
  Administered 2016-11-15: 100 g via ORAL

## 2016-11-15 MED ORDER — BOOST / RESOURCE BREEZE PO LIQD
1.0000 | Freq: Four times a day (QID) | ORAL | Status: DC
  Administered 2016-11-15 – 2016-11-20 (×17): 1 via ORAL
  Administered 2016-11-21: 237 mL via ORAL

## 2016-11-15 NOTE — Plan of Care (Signed)
Problem: Pain Managment: Goal: General experience of comfort will improve Outcome: Not Progressing Pt with complaint of abdominal pain.  Abdomen is distended.  Bowel sounds present.  Rectal tube remains in with loose stool in collection bag.  Pt on clear liquid diet, tolerating well.  Plan is for colonoscopy tomorrow.  Tramadol given for pain.

## 2016-11-15 NOTE — Progress Notes (Signed)
PROGRESS NOTE    [name]  [id number] DOB: May 21, 1951 DOA: 11/08/2016 PCP: Sandi Mariscal, MD   Brief Narrative:  65 y.o. male with medical history significant ofA. Fib not on anticoagulation, DM Type 2, HTN, HLD, depression who presents initially with complaints of uncontrolled constipation. Patient admitted to being essentially noncompliant with any of his regimen for the past several years. In the emergency department, patient was noted to have symptomatically hyponatremia and tachycardia. Patient was also mildly hypotensive, receiving IV fluid hydration. Patient also found to have significant stool burden on CT abdomen pelvis. Fecal distraction attempt was without success. Smog enema yielded limited success. The patient was ultimately admitted for further management of multiple medical issues  Assessment & Plan:   Principal Problem:   Constipation Active Problems:   Essential hypertension, benign   Atrial fibrillation (HCC)   Diabetes mellitus type 2 in obese (HCC)   Hyponatremia   Urinary tract infection without hematuria   Hypokalemia   CKD (chronic kidney disease) stage 3, GFR 30-59 ml/min   Hyperbilirubinemia   Homelessness   Protein-calorie malnutrition, severe   Impaction of colon (HCC)   Abdominal pain   Generalized weakness   Fecal impaction (HCC)   AKI (acute kidney injury) ([place])  Constipation -Patient with history of chronic constipation (approx 2 months of ongoing constipation) -Failed manual disimpaction in the emergency department -never has had colonoscopy before -GI consulted, for potential sigmoidoscopy vs colonoscopy; received bowel prep on 12/3 and had multiple BM's (but with concerns that he is still not well prep); addendum on initial CT scan of his abd demonstrated concerns of potential colonic mass. Plan is for colonoscopy on 12/5 -will provide another bowel prep; continue CLD  HTN -Patient is continued on IV fluid hydration -Essentially has been  untreated for several years secondary to lack of compliance -will monitor and adjust  antihypertensive agents as needed  -currently with soft to low BP while on metoprolol   Afib with rapid ventricular rate -Previously on Eliquis -CHA2DS2-VASc score is 2-3, with a 3.2% stroke risk per year -patient declining use of any anticoagulation  -will continue metoprolol -appreciate cardiology input and rec's. Plan is for digoxin along with metoprolol.  -HR improved and much controlled now -follow electrolytes and correct them as needed  -2-D echo unable to be performed   DM -Essentially has not been taking any medications for several years -Hemoglobin A1c is 5.2 -Continue sliding scale insulin while inpatient   Hyponatremia -Improved with IV fluid resuscitation -Repeat BMET to follow Na level -Continue to encourage proper by mouth intake  UTI -Reports dysuria -has completed antibiotic therapy with use of rocephin on 12/3 (7/7 days) -Urine culture ordered, results demonstrating multiple microorganism  -No leukocytosis. No fevers  Hypokalemia -Mild, K+ 3.3 at presentation -Potassium included in IV fluids -follow trend and replete as needed   Acute on chronic renal failure (stage 2-3 at baseline) -Creatinine improved to 0.55 from 1.36 with IV fluids; will repeat BMET in am -Repeat basic metabolic panel intermittently to renal function trend -appears to have stage 2-3 at baseline -dehydration/pre-renal azotemia provoking AKI  Urinary retention -acute; most likely with underlying BPH -given need for > 2 In and out Cath and bladder scan with >3105m -patient started on flomax and foley inserted -will attempt voiding trial in am   Hyperbilirubinemia -Uncertain etiology -Bilirubin was 2.1; WNL now -Urine drug screen negative, alcohol levels are negative  Homelessness -SW consult for possible assistance with placement  Severe protein calorie  malnutrition  -will follow  nutritional service rec's -feeding supplements and encouragement to increase PO intake provided  DVT prophylaxis: Therapeutic Lovenox Code Status: Full code Family Communication: Patient in room, family not at bedside Disposition Plan: will stabilize patient condition and follow SW/CM assistance for safe discharge  Consultants:   GI  Procedures:   See below for x-ray reports   Antimicrobials: Anti-infectives    Start     Dose/Rate Route Frequency Ordered Stop   11/10/16 2000  cefTRIAXone (ROCEPHIN) 1 g in dextrose 5 % 50 mL IVPB     1 g 100 mL/hr over 30 Minutes Intravenous Every 24 hours 11/10/16 1847 11/14/16 2236   11/09/16 2000  cefTRIAXone (ROCEPHIN) 2 g in dextrose 5 % 50 mL IVPB  Status:  Discontinued     2 g 100 mL/hr over 30 Minutes Intravenous Every 24 hours 11/09/16 0312 11/10/16 1847   11/08/16 2045  cefTRIAXone (ROCEPHIN) 2 g in dextrose 5 % 50 mL IVPB     2 g 100 mL/hr over 30 Minutes Intravenous  Once 11/08/16 2042 11/08/16 2229      Subjective: Complaining of intermittent episodes of abd pain. No nausea or vomiting. Afebrile. Concerns about ability to tolerate further bowel prep    Objective: Vitals:   11/15/16 0605 11/15/16 1033 11/15/16 1037 11/15/16 1333  BP: 108/79  102/70 94/65  Pulse: (!) 116 (!) 124  91  Resp: 18   18  Temp: 98.2 F (36.8 C)   98 F (36.7 C)  TempSrc: Oral   Oral  SpO2: 95%   98%  Weight:      Height:        Intake/Output Summary (Last 24 hours) at 11/15/16 1908 Last data filed at 11/15/16 1851  Gross per 24 hour  Intake             3625 ml  Output             5975 ml  Net            -2350 ml   Filed Weights   11/09/16 2012  Weight: 93.4 kg (205 lb 14.6 oz)    Examination: General exam: Afebrile, reports feeling ok; continue to have intermittent episodes of abdominal pain and still feeling constipated. Patient with excoriated skin in his scrotum, anal and sacrum area. Rectal tube d/c Respiratory system: Clear to  auscultation. Respiratory effort normal. Cardiovascular system: S1 & S2 heard, Tachycardic Gastrointestinal system: Abdomen is mildly distended, soft and w/o specific tenderness point (kind of general discomfort on deep palpation). No organomegaly or masses felt. Normal bowel sounds heard. Central nervous system: Alert and oriented. No focal neurological deficits. Extremities: Symmetric 5 x 5 power. Skin: No open ulcers, excoriated skin around rectum, sacral and scrotum area. Psychiatry: Judgement and insight appear normal. Mood & affect appropriate. With intermittent rapid mood swings   Data Reviewed: I have personally reviewed following labs and imaging studies  CBC:  Recent Labs Lab 11/09/16 0301 11/12/16 0430 11/14/16 0505  WBC 6.0 4.1 4.0  HGB 12.9* 10.1* 9.2*  HCT 35.5* 28.7* 26.9*  MCV 91.7 95.7 97.8  PLT 134* 114* 329*   Basic Metabolic Panel:  Recent Labs Lab 11/09/16 0301 11/10/16 0520 11/12/16 0430 11/14/16 0505  NA 131* 133* 131* 133*  K 3.3* 3.7 4.5 4.6  CL 96* 103 100* 101  CO2 23 20* 23 25  GLUCOSE 122* 130* 106* 109*  BUN 31* '20 11 9  ' CREATININE 1.04 0.83 0.61  0.55*  CALCIUM 8.4* 7.7* 7.3* 7.4*  MG 1.7 1.8  --   --    GFR: Estimated Creatinine Clearance: 107 mL/min (by C-G formula based on SCr of 0.55 mg/dL (L)).   Liver Function Tests:  Recent Labs Lab 11/12/16 0430  AST 11*  ALT 7*  ALKPHOS 48  BILITOT 0.6  PROT 4.7*  ALBUMIN 2.3*   CBG:  Recent Labs Lab 11/14/16 1635 11/14/16 2219 11/15/16 0737 11/15/16 1213 11/15/16 1648  GLUCAP 120* 111* 111* 153* 122*   Sepsis Labs:  Recent Labs Lab 11/08/16 2239 11/09/16 0244  LATICACIDVEN 3.63* 1.40    Recent Results (from the past 240 hour(s))  Urine culture     Status: Abnormal   Collection Time: 11/08/16  7:47 PM  Result Value Ref Range Status   Specimen Description URINE, CLEAN CATCH  Final   Special Requests NONE  Final   Culture MULTIPLE SPECIES PRESENT, SUGGEST  RECOLLECTION (A)  Final   Report Status 11/10/2016 FINAL  Final     Radiology Studies: No results found.  Scheduled Meds: . busPIRone  5 mg Oral TID  . digoxin  0.125 mg Oral Daily  . docusate sodium  100 mg Oral BID  . feeding supplement  1 Container Oral QID  . hydrocortisone   Rectal BID  . insulin aspart  0-15 Units Subcutaneous TID WC  . LORazepam  1 mg Oral Q8H  . metoprolol tartrate  25 mg Oral BID  . multivitamin with minerals  1 tablet Oral Daily  . peg 3350 powder  0.5 kit Oral Once  . sodium chloride flush  3 mL Intravenous Q12H  . tamsulosin  0.4 mg Oral Daily   Continuous Infusions: . 0.9 % NaCl with KCl 20 mEq / L 75 mL/hr at 11/15/16 1500     LOS: 7 days   Barton Dubois, MD [facility] Pager [phone]  If 7PM-7AM, please contact night-coverage [email or web address] Password [email or web address] 11/15/2016, 7:08 PM

## 2016-11-15 NOTE — Progress Notes (Signed)
Progress Note   Subjective  Chief Complaint: Constipation/fecal impaction  Today, the patient is found lying in bed. We discuss that they recently found abnormal lesion in cecum on original CT when this was reread and he asks many questions. He tells me that he has continued to pass some stool via rectal tube overnight. He does continue with a "feeling of obstruction". He declined having his rectal tube removed and would like this kept in place while having more prep today. He does ask that the prep be mixed with apple juice today.    Objective   Vital signs in last 24 hours: Temp:  [98 F (36.7 C)-98.6 F (37 C)] 98.2 F (36.8 C) (12/04 0605) Pulse Rate:  [109-137] 116 (12/04 0605) Resp:  [18] 18 (12/04 0605) BP: (105-121)/(75-79) 108/79 (12/04 0605) SpO2:  [95 %-99 %] 95 % (12/04 0605) Last BM Date: 11/14/16 General: Alert, obese male in NAD Heart:  Regular rate and rhythm; no murmurs Lungs: Respirations even and unlabored, lungs CTA bilaterally Abdomen:  Soft, nontender and nondistended. Normal bowel sounds. Extremities:  Without edema. Neurologic:  Alert and oriented,  grossly normal neurologically. Psych:  Cooperative. Normal mood and affect.  Intake/Output from previous day: 12/03 0701 - 12/04 0700 In: 1644.2 [P.O.:240; I.V.:1404.2] Out: 7050 [Urine:7050]  Lab Results:  Recent Labs  11/14/16 0505  WBC 4.0  HGB 9.2*  HCT 26.9*  PLT 131*   BMET  Recent Labs  11/14/16 0505  NA 133*  K 4.6  CL 101  CO2 25  GLUCOSE 109*  BUN 9  CREATININE 0.55*  CALCIUM 7.4*   LFT No results for input(s): PROT, ALBUMIN, AST, ALT, ALKPHOS, BILITOT, BILIDIR, IBILI in the last 72 hours. PT/INR No results for input(s): LABPROT, INR in the last 72 hours.  Studies/Results: No results found.  Ct Abdomen/Pelvis 11/08/16-Addendum by Dr. Pascal Lux: Shows suspected cecal mass   Assessment / Plan:    Assessment: 1. Severe constipation/fecal impaction:Overall he has had a  SMOG enema, moviprep, gastrograffin enema and completed Golytely on 11/13/16. A flexiseal was placed because of perineal irritation. Patient does still complain of feeling obstructed, though there is stool in bag this morning. Discussed plans for movi-prep this afternoon and colonoscopy tomorrow 2. Abnormal CT abdomen:abnormal CT showing "cecal mass" per Dr. Pascal Lux in radiology, when he re-read the film  Plan: 1. Cancelled Barium enema for today 2. Scheduled Colonoscopy at 0800 with Dr. Fuller Plan tomorrow for further eval of suspected cecal mass. Discussed risks, benefits and limitations and the patient agrees to proceed 3. Movi prep ordered, patient requests that this be mixed with apple juice "to help the flavor". This is ok. 4. Patient to remain on clear liquid diet today and NPO after midnight 5. Called radiology and spoke with them. They are going to add addendum onto original CT to show suspected cecal mass 6. Spent a long time in discussion with patient today, he is scared that he will be sent home tomorrow after colonoscopy. I assured him that he would at least be here for the day tomorrow. Also we agreed that he can keep rectal tube in place to help with irritation while prepping overnight 7. Discussed above with Dr. Fuller Plan, please await any further recs.   LOS: 7 days   Levin Erp  11/15/2016, 9:20 AM  Pager # [phone]    Attending physician's note   I have taken an interval history, reviewed the chart and examined the patient. I agree with the  Advanced Practitioner's note, impression and recommendations. 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 Pascal Lux. BE cancelled. Colonoscopy scheduled for tomorrow.   Lucio Edward, MD [facility] [phone] Mon-Fri 8a-5p [phone] after 5p, weekends, holidays

## 2016-11-15 NOTE — Progress Notes (Signed)
Nutrition Follow-up  DOCUMENTATION CODES:   Severe malnutrition in context of chronic illness  INTERVENTION:  Until diet advanced again, will d/c Ensure Enlive. Once diet advanced recommend Ensure Enlive to assist patient in meeting calorie and protein needs.   Provide Boost Breeze po QID, each supplement provides 250 kcal and 9 grams of protein.  Will continue to monitor plan and adjust nutrition intervention as needed.  NUTRITION DIAGNOSIS:   Inadequate oral intake related to poor appetite, other (see comment) (constipation) as evidenced by per patient/family report, percent weight loss.  Ongoing.  GOAL:   Patient will meet greater than or equal to 90% of their needs  Not met without Ensure.  MONITOR:   PO intake, Supplement acceptance, Labs, Weight trends, I & O's, Skin  REASON FOR ASSESSMENT:   Consult Assessment of nutrition requirement/status  ASSESSMENT:   65 y.o.malewith medical history significant ofA. Fib not on anticoagulation, DM Type 2, HTN, HLD, depression who presents initially with complaints of uncontrolled constipation. Patient admitted to being essentially noncompliant with any of his regimen for the past several years.  -CT Abdomen/Pelvis on 11/27 showing suspected cecal mass. -Patient is scheduled for a colonoscopy tomorrow morning with Dr. Fuller Plan for further evaluation of suspected cecal mass. Will be NPO after midnight tonight. -Patient has rectal tube in place in setting of output. MD had ordered for removal in setting of decreased output, but patient refusing.   Spoke with patient at bedside. He reports his appetite is still poor, but he is still eating for distraction from the pain and also in an attempt to get bowels to move. Patient reports he is still feeling constipated, even with ongoing output. Also endorses abdominal pain, and per chart still has abdominal distension. Patient enjoys the Ensure [place] and was drinking 3-4 per day (confirmed  by chart review). As patient is now on CLD, he is amenable to drinking Boost Breeze instead, but would like Ensure Enlive again when diet advanced.   Meal Completion: 75-100% when recorded in chart. In the past 24 hours patient has had approximately 1140 kcal (57% minimum estimated kcal needs) and 35 grams protein (37% minimum estimated protein needs).  Medications reviewed and include: Colace, Novolog sliding scale TID, multivitamin with minerals daily, NS @ 75 ml/hr.  Labs reviewed: CBG 111-153 past 24 hrs, Sodium 133, Creatinine 0.55.   Discussed with RN. RN not sure if patient is aware of mass found on CT. Diet advancement post-colonoscopy will depend on findings and plan of care.  Diet Order:  Diet clear liquid Room service appropriate? Yes; Fluid consistency: Thin Diet NPO time specified  Skin:  Reviewed, no issues  Last BM:  11/15/2016  Height:   Ht Readings from Last 1 Encounters:  11/09/16 _0  (1.88 m)    Weight:   Wt Readings from Last 1 Encounters:  11/09/16 205 lb 14.6 oz (93.4 kg)    Ideal Body Weight:  86.36 kg  BMI:  Body mass index is 26.44 kg/m.  Estimated Nutritional Needs:   Kcal:  2000-2300 (MSJ x 1.1-1.3)  Protein:  95-115 grams (1-1.2 grams/kg)  Fluid:  >/= 2.3 L/day (25 ml/kg)  EDUCATION NEEDS:   Education needs addressed (Encouraged adequate intake of calories and protein through small, frequent meals. Also discussed liquids may be best tolerated now.)  Willey Blade, MS, RD, LDN Pager: [phone] After Hours Pager: [phone]

## 2016-11-15 NOTE — Care Management Important Message (Signed)
Important Message  Patient Details  Name: [name] MRN: [id number] Date of Birth: 1951-03-25   Medicare Important Message Given:  Yes    Camillo Flaming 11/15/2016, 10:21 AMImportant Message  Patient Details  Name: [name] MRN: [id number] Date of Birth: Jul 28, 1951   Medicare Important Message Given:  Yes    Camillo Flaming 11/15/2016, 10:21 AM

## 2016-11-15 NOTE — Evaluation (Signed)
Physical Therapy Evaluation Patient Details Name: [name] MRN: [id number] DOB: [birth year] Today's Date: 11/15/2016   History of Present Illness   [name] is a 65 y.o. male with medical history significant of A. Fib not on anticoagulation, DM Type 2, HTN, HLD, depression - he is not taking medication for any of these problems and has not seen a physician in 2 years.  Patient with severe constipation, malnutrition, having trouble walking, having trouble keeping energy.  Right arm partially immobile - ?pinched nerve in neck for months.  Abnormal lesion in cecum on original CT and pt with planned colonoscopy 11/16/16 for further evaluation.  Clinical Impression  Pt admitted with above diagnosis. Pt currently with functional limitations due to the deficits listed below (see PT Problem List).  Pt will benefit from skilled PT to increase their independence and safety with mobility to allow discharge to the venue listed below.  Pt requiring assistance for mobility at this time and would benefit from ST-SNF upon d/c.  Pt requiring increased time today to explain each task prior to performing.     Follow Up Recommendations SNF;Supervision for mobility/OOB    Equipment Recommendations  None recommended by PT    Recommendations for Other Services       Precautions / Restrictions Precautions Precautions: Fall Precaution Comments: flexiseal      Mobility  Bed Mobility Overal bed mobility: Needs Assistance Bed Mobility: Supine to Sit;Sit to Sidelying     Supine to sit: Min assist;HOB elevated   Sit to sidelying: Mod assist General bed mobility comments: discussed log roll technique due to flexiseal however pt performed supine to sit, assist for trunk upright, able to return to bed semisidelying with assist for LEs  Transfers Overall transfer level: Needs assistance Equipment used:  (pt's 3 wheeled walker) Transfers: Sit to/from Stand Sit to Stand: Min assist;From elevated  surface         General transfer comment: verbal cues for safe technique, assist to rise and control descent  Ambulation/Gait Ambulation/Gait assistance: Min assist Ambulation Distance (Feet): 50 Feet Assistive device:  ( 3 wheeled walker) Gait Pattern/deviations: Step-through pattern;Decreased stride length;Trunk flexed     General Gait Details: verbal cues for safety, assist for steadying occasionally, distance to tolerance, HR up to 136 bpm  Stairs            Wheelchair Mobility    Modified Rankin (Stroke Patients Only)       Balance                                             Pertinent Vitals/Pain Pain Assessment: Faces Faces Pain Scale: Hurts little more Pain Location: peri area Pain Descriptors / Indicators: Grimacing;Guarding Pain Intervention(s): Repositioned;Limited activity within patient's tolerance;Monitored during session    Home Living Family/patient expects to be discharged to:: Shelter/Homeless                      Prior Function           Comments: Was not taking care of himself PTA per his own report and had gotten weaker and weaker (not walking much and when he did he used a 3W walker--currently in his room)     Hand Dominance        Extremity/Trunk Assessment  Lower Extremity Assessment: Generalized weakness         Communication   Communication: No difficulties  Cognition Arousal/Alertness: Awake/alert Behavior During Therapy: Anxious Overall Cognitive Status: Within Functional Limits for tasks assessed                      General Comments      Exercises     Assessment/Plan    PT Assessment Patient needs continued PT services  PT Problem List Decreased strength;Decreased activity tolerance;Decreased mobility          PT Treatment Interventions DME instruction;Gait training;Functional mobility training;Therapeutic exercise;Therapeutic  activities;Patient/family education    PT Goals (Current goals can be found in the Care Plan section)  Acute Rehab PT Goals PT Goal Formulation: With patient Time For Goal Achievement: 11/29/16 Potential to Achieve Goals: Good    Frequency Min 3X/week   Barriers to discharge        Co-evaluation               End of Session Equipment Utilized During Treatment: Gait belt Activity Tolerance: Patient limited by pain Patient left: in bed;with call bell/phone within reach;with bed alarm set           Time: [id number] PT Time Calculation (min) (ACUTE ONLY): 30 min   Charges:   PT Evaluation $PT Eval Moderate Complexity: 1 Procedure     PT G Codes:        Lyndy Russman,KATHrine E 11/15/2016, 12:30 PM Carmelia Bake, PT, DPT 11/15/2016 Pager: [phone]

## 2016-11-16 DIAGNOSIS — D120 Benign neoplasm of cecum: Secondary | ICD-10-CM | POA: Diagnosis not present

## 2016-11-16 DIAGNOSIS — K5900 Constipation, unspecified: Secondary | ICD-10-CM | POA: Diagnosis not present

## 2016-11-16 DIAGNOSIS — I480 Paroxysmal atrial fibrillation: Secondary | ICD-10-CM | POA: Diagnosis not present

## 2016-11-16 DIAGNOSIS — N179 Acute kidney failure, unspecified: Secondary | ICD-10-CM | POA: Diagnosis not present

## 2016-11-16 DIAGNOSIS — E871 Hypo-osmolality and hyponatremia: Secondary | ICD-10-CM | POA: Diagnosis not present

## 2016-11-16 DIAGNOSIS — R933 Abnormal findings on diagnostic imaging of other parts of digestive tract: Secondary | ICD-10-CM | POA: Diagnosis not present

## 2016-11-16 DIAGNOSIS — I481 Persistent atrial fibrillation: Secondary | ICD-10-CM | POA: Diagnosis not present

## 2016-11-16 DIAGNOSIS — E1169 Type 2 diabetes mellitus with other specified complication: Secondary | ICD-10-CM | POA: Diagnosis not present

## 2016-11-16 DIAGNOSIS — I482 Chronic atrial fibrillation: Secondary | ICD-10-CM | POA: Diagnosis not present

## 2016-11-16 DIAGNOSIS — K5901 Slow transit constipation: Secondary | ICD-10-CM | POA: Diagnosis not present

## 2016-11-16 DIAGNOSIS — K5641 Fecal impaction: Secondary | ICD-10-CM | POA: Diagnosis not present

## 2016-11-16 DIAGNOSIS — N183 Chronic kidney disease, stage 3 (moderate): Secondary | ICD-10-CM | POA: Diagnosis not present

## 2016-11-16 DIAGNOSIS — K5649 Other impaction of intestine: Secondary | ICD-10-CM | POA: Diagnosis not present

## 2016-11-16 DIAGNOSIS — R109 Unspecified abdominal pain: Secondary | ICD-10-CM | POA: Diagnosis not present

## 2016-11-16 DIAGNOSIS — R1084 Generalized abdominal pain: Secondary | ICD-10-CM | POA: Diagnosis not present

## 2016-11-16 LAB — BASIC METABOLIC PANEL
ANION GAP: 6 (ref 5–15)
BUN: 5 mg/dL — AB (ref 6–20)
CHLORIDE: 104 mmol/L (ref 101–111)
CO2: 28 mmol/L (ref 22–32)
Calcium: 7.8 mg/dL — ABNORMAL LOW (ref 8.9–10.3)
Creatinine, Ser: 0.53 mg/dL — ABNORMAL LOW (ref 0.61–1.24)
Glucose, Bld: 97 mg/dL (ref 65–99)
POTASSIUM: 3.8 mmol/L (ref 3.5–5.1)
SODIUM: 138 mmol/L (ref 135–145)

## 2016-11-16 LAB — CBC
HCT: 28.8 % — ABNORMAL LOW (ref 39.0–52.0)
HEMOGLOBIN: 9.8 g/dL — AB (ref 13.0–17.0)
MCH: 33.7 pg (ref 26.0–34.0)
MCHC: 34 g/dL (ref 30.0–36.0)
MCV: 99 fL (ref 78.0–100.0)
Platelets: 156 10*3/uL (ref 150–400)
RBC: 2.91 MIL/uL — AB (ref 4.22–5.81)
RDW: 15.4 % (ref 11.5–15.5)
WBC: 4.7 10*3/uL (ref 4.0–10.5)

## 2016-11-16 LAB — GLUCOSE, CAPILLARY
GLUCOSE-CAPILLARY: 96 mg/dL (ref 65–99)
GLUCOSE-CAPILLARY: 112 mg/dL — AB (ref 65–99)
GLUCOSE-CAPILLARY: 106 mg/dL — AB (ref 65–99)
GLUCOSE-CAPILLARY: 105 mg/dL — AB (ref 65–99)

## 2016-11-16 MED ORDER — SODIUM CHLORIDE 0.9 % IV SOLN
INTRAVENOUS | Status: DC
  Administered 2016-11-17: 07:00:00 via INTRAVENOUS

## 2016-11-16 MED ORDER — ONDANSETRON HCL 4 MG/2ML IJ SOLN
INTRAMUSCULAR | Status: AC
  Filled 2016-11-16: qty 2

## 2016-11-16 MED ORDER — METOPROLOL TARTRATE 50 MG PO TABS
50.0000 mg | ORAL_TABLET | Freq: Two times a day (BID) | ORAL | Status: DC
  Administered 2016-11-16 – 2016-11-24 (×17): 50 mg via ORAL
  Filled 2016-11-16 (×17): qty 1

## 2016-11-16 MED ORDER — PROPOFOL 10 MG/ML IV BOLUS
INTRAVENOUS | Status: AC
  Filled 2016-11-16: qty 60

## 2016-11-16 MED ORDER — PEG-KCL-NACL-NASULF-NA ASC-C 100 G PO SOLR
0.5000 | Freq: Once | ORAL | Status: AC
  Administered 2016-11-16: 100 g via ORAL
  Filled 2016-11-16: qty 1

## 2016-11-16 MED ORDER — PEG-KCL-NACL-NASULF-NA ASC-C 100 G PO SOLR
0.5000 | Freq: Once | ORAL | Status: AC
  Administered 2016-11-16: 100 g via ORAL

## 2016-11-16 MED ORDER — LORAZEPAM 1 MG PO TABS
1.0000 mg | ORAL_TABLET | Freq: Two times a day (BID) | ORAL | Status: DC | PRN
  Administered 2016-11-16 – 2016-11-21 (×4): 1 mg via ORAL
  Filled 2016-11-16 (×4): qty 1

## 2016-11-16 MED ORDER — PEG-KCL-NACL-NASULF-NA ASC-C 100 G PO SOLR: 1.0000 | Freq: Once | ORAL | Status: DC

## 2016-11-16 MED ORDER — LIDOCAINE 2% (20 MG/ML) 5 ML SYRINGE
INTRAMUSCULAR | Status: AC
  Filled 2016-11-16: qty 5

## 2016-11-16 NOTE — Progress Notes (Signed)
Patient is refusing to have foley catheter removed.  Patient stated he may consider removing it this afternoon but not now.  Informed patient of infection risk of catheter remaining in place.  Patient still refusing.  Will reassess. MD informed.

## 2016-11-16 NOTE — Consult Note (Signed)
   [facility] Inpatient Consult   11/16/2016  [name] July 08, 1951 [id number]     [name] evaluated for [facility] program. Martin Majestic to speak with him and he states " I am not sure what my plan will be. I am not trying to be rude but I do not feel like to talking about your services right now." Left [facility] Care Management brochure and contact information at bedside. Spoke with inpatient RNCM who indicates [name] discharge plan is for SNF.    Marthenia Rolling, MSN-Ed, RN,BSN [facility] Liaison [phone]

## 2016-11-16 NOTE — Progress Notes (Signed)
PROGRESS NOTE    [name]  [id number] DOB: 1951/03/16 DOA: 11/08/2016 PCP: Sandi Mariscal, MD   Brief Narrative:  65 y.o. male with medical history significant ofA. Fib not on anticoagulation, DM Type 2, HTN, HLD, depression who presents initially with complaints of uncontrolled constipation. Patient admitted to being essentially noncompliant with any of his regimen for the past several years. In the emergency department, patient was noted to have symptomatically hyponatremia and tachycardia. Patient was also mildly hypotensive, receiving IV fluid hydration. Patient also found to have significant stool burden on CT abdomen pelvis. Fecal distraction attempt was without success. Smog enema yielded limited success. The patient was ultimately admitted for further management of multiple medical issues. Planning to have colonoscopy in am after 3rd bowel prep.  Assessment & Plan:   Principal Problem:   Constipation Active Problems:   Essential hypertension, benign   Atrial fibrillation (HCC)   Diabetes mellitus type 2 in obese (HCC)   Hyponatremia   Urinary tract infection without hematuria   Hypokalemia   CKD (chronic kidney disease) stage 3, GFR 30-59 ml/min   Hyperbilirubinemia   Homelessness   Protein-calorie malnutrition, severe   Impaction of colon (HCC)   Abdominal pain   Generalized weakness   Fecal impaction (HCC)   AKI (acute kidney injury) ([place])  Constipation/colon impaction and concerns for Cecal Mass -Patient with history of chronic constipation (approx 2 months of ongoing constipation prior to admission) -Failed manual disimpaction in the emergency department -never has had colonoscopy before -GI consulted, for potential sigmoidoscopy vs colonoscopy; received bowel prep on 12/3 and 12/4 had multiple BM's (but with concerns that he is still not well prep); addendum on initial CT scan of his abd demonstrated concerns of potential cecal mass. Plan is for colonoscopy on  12/6 -will provide another bowel prep today; continue CLD and NPO status after midnight  -impaction resolved now.  HTN -Patient is continued on IV fluid hydration; but now changed to maintenance  -Essentially has been untreated for several years secondary to lack of compliance -will monitor and adjust  antihypertensive agents as needed  -currently with soft to low BP while on metoprolol   Afib with rapid ventricular rate -Previously on Eliquis -CHA2DS2-VASc score is 2 -patient declining use of any anticoagulation at discharge -will continue metoprolol and Digoxin as recommended by cardiology -follow electrolytes and correct them as needed  -2-D echo unable to be performed   DM -Essentially has not been taking any medications for several years -Hemoglobin A1c is 5.2 -Continue sliding scale insulin while inpatient  -discussed low carb diet at discharge; anticipate not need for hypoglycemic regimen   Hyponatremia -Improved with IV fluid resuscitation -Na 138 -will monitor trend   UTI -no having any further dysuria  -has completed antibiotic therapy with use of rocephin on 12/3 (7/7 days) -Urine culture ordered, results demonstrated multiple microorganism  -No leukocytosis. No fevers  Hypokalemia -Mild, K+ 3.3 at presentation -Potassium included in IV fluids for maintenance  -follow trend and replete as needed   Acute on chronic renal failure (stage 2-3 at baseline) -Creatinine improved to 0.53 from 1.36 with IV fluids -will monitor trend intermittently -appears to have stage 2-3 at baseline -dehydration/pre-renal azotemia provoking AKI  Urinary retention -acute; most likely with underlying BPH -given need for > 2 In and out Cath and bladder scan with >336m -patient started on flomax and foley inserted -will attempt voiding trial and foley discontinuation today  Hyperbilirubinemia -Uncertain etiology -Bilirubin was 2.1 on admission;  WNL now -Urine drug  screen negative, alcohol levels are negative  Physical deconditioning  -SNF when medically stable  Homelessness -SW consult for possible assistance with placement  Severe protein calorie malnutrition  -will follow nutritional service rec's -feeding supplements and encouragement to increase PO intake provided  DVT prophylaxis: Therapeutic Lovenox Code Status: Full code Family Communication: Patient in room, family not at bedside Disposition Plan: plan is to discharge to SNF once medically stable for rehabilitation. Colonoscopy in am. Will adjust metoprolol for better rate control.   Consultants:   GI  Procedures:   See below for x-ray reports   Antimicrobials: Anti-infectives    Start     Dose/Rate Route Frequency Ordered Stop   11/10/16 2000  cefTRIAXone (ROCEPHIN) 1 g in dextrose 5 % 50 mL IVPB     1 g 100 mL/hr over 30 Minutes Intravenous Every 24 hours 11/10/16 1847 11/14/16 2236   11/09/16 2000  cefTRIAXone (ROCEPHIN) 2 g in dextrose 5 % 50 mL IVPB  Status:  Discontinued     2 g 100 mL/hr over 30 Minutes Intravenous Every 24 hours 11/09/16 0312 11/10/16 1847   11/08/16 2045  cefTRIAXone (ROCEPHIN) 2 g in dextrose 5 % 50 mL IVPB     2 g 100 mL/hr over 30 Minutes Intravenous  Once 11/08/16 2042 11/08/16 2229      Subjective: No abd pain this morning, no CP, SOB, nausea or vomiting. Unsuccessful bowel prep overnight.   Objective: Vitals:   11/15/16 1037 11/15/16 1333 11/15/16 2057 11/16/16 0514  BP: 102/70 94/65 (!) 86/41 109/80  Pulse:  91 (!) 111 (!) 132  Resp:  '18 18 18  ' Temp:  98 F (36.7 C) 98.5 F (36.9 C) 97.7 F (36.5 C)  TempSrc:  Oral Oral Oral  SpO2:  98% 95% 100%  Weight:      Height:        Intake/Output Summary (Last 24 hours) at 11/16/16 1330 Last data filed at 11/16/16 0200  Gross per 24 hour  Intake           3327.5 ml  Output             1650 ml  Net           1677.5 ml   Filed Weights   11/09/16 2012  Weight: 93.4 kg (205 lb  14.6 oz)    Examination: General exam: Afebrile, reports feeling ok; denies abd pain, nausea and vomiting. No CP and endorses no palpitations. Patient endorses feeling constipated still; his bowel prep overnight was not successful.  Respiratory system: Clear to auscultation. Respiratory effort normal. Cardiovascular system: S1 & S2 heard, Tachycardic Gastrointestinal system: Abdomen is soft. No guard; mild distension, No organomegaly or masses felt. Normal bowel sounds heard. Central nervous system: Alert and oriented. No focal neurological deficits. Extremities: Symmetric 5 x 5 power. Skin: No open ulcers, excoriated skin around rectum, sacral and scrotum area (improved). Psychiatry: Judgement and insight appear normal. Mood & affect appropriate. With intermittent rapid mood swings   Data Reviewed: I have personally reviewed following labs and imaging studies  CBC:  Recent Labs Lab 11/12/16 0430 11/14/16 0505 11/16/16 0825  WBC 4.1 4.0 4.7  HGB 10.1* 9.2* 9.8*  HCT 28.7* 26.9* 28.8*  MCV 95.7 97.8 99.0  PLT 114* 131* 762   Basic Metabolic Panel:  Recent Labs Lab 11/10/16 0520 11/12/16 0430 11/14/16 0505 11/16/16 0825  NA 133* 131* 133* 138  K 3.7 4.5 4.6 3.8  CL  103 100* 101 104  CO2 20* '23 25 28  ' GLUCOSE 130* 106* 109* 97  BUN '20 11 9 ' 5*  CREATININE 0.83 0.61 0.55* 0.53*  CALCIUM 7.7* 7.3* 7.4* 7.8*  MG 1.8  --   --   --    GFR: Estimated Creatinine Clearance: 107 mL/min (by C-G formula based on SCr of 0.53 mg/dL (L)).   Liver Function Tests:  Recent Labs Lab 11/12/16 0430  AST 11*  ALT 7*  ALKPHOS 48  BILITOT 0.6  PROT 4.7*  ALBUMIN 2.3*   CBG:  Recent Labs Lab 11/15/16 1213 11/15/16 1648 11/15/16 2055 11/16/16 0817 11/16/16 1224  GLUCAP 153* 122* 127* 96 112*   Sepsis Labs: No results for input(s): PROCALCITON, LATICACIDVEN in the last 168 hours.  Recent Results (from the past 240 hour(s))  Urine culture     Status: Abnormal    Collection Time: 11/08/16  7:47 PM  Result Value Ref Range Status   Specimen Description URINE, CLEAN CATCH  Final   Special Requests NONE  Final   Culture MULTIPLE SPECIES PRESENT, SUGGEST RECOLLECTION (A)  Final   Report Status 11/10/2016 FINAL  Final     Radiology Studies: No results found.  Scheduled Meds: . busPIRone  5 mg Oral TID  . digoxin  0.125 mg Oral Daily  . docusate sodium  100 mg Oral BID  . feeding supplement  1 Container Oral QID  . hydrocortisone   Rectal BID  . insulin aspart  0-15 Units Subcutaneous TID WC  . metoprolol tartrate  50 mg Oral BID  . multivitamin with minerals  1 tablet Oral Daily  . peg 3350 powder  0.5 kit Oral Once   And  . peg 3350 powder  0.5 kit Oral Once  . sodium chloride flush  3 mL Intravenous Q12H  . tamsulosin  0.4 mg Oral Daily   Continuous Infusions: . sodium chloride    . 0.9 % NaCl with KCl 20 mEq / L 75 mL/hr at 11/16/16 0126     LOS: 8 days   Barton Dubois, MD [facility] Pager [phone]  If 7PM-7AM, please contact night-coverage [email or web address] Password [email or web address] 11/16/2016, 1:30 PM

## 2016-11-16 NOTE — Progress Notes (Signed)
    Progress Note   Subjective  Chief Complaint: Constipation/fecal impaction  Patient found asleep in bed this morning, he is aware that his prep did not work overnight. He still was having brown stool this morning. He is aware of plans to reprep today and for colonoscopy hopefully in the morning. He denies any new complaints.    Objective   Vital signs in last 24 hours: Temp:  [97.7 F (36.5 C)-98.5 F (36.9 C)] 97.7 F (36.5 C) (12/05 0514) Pulse Rate:  [91-132] 132 (12/05 0514) Resp:  [18] 18 (12/05 0514) BP: (86-109)/(41-80) 109/80 (12/05 0514) SpO2:  [95 %-100 %] 100 % (12/05 0514) Last BM Date: 11/15/16   General: Obese male in NAD Heart:  Regular rate and rhythm; no murmurs Lungs: Respirations even and unlabored, lungs CTA bilaterally Abdomen:  Soft, nontender and nondistended. Normal bowel sounds. Extremities:  Without edema. Neurologic:  Alert and oriented,  grossly normal neurologically. Psych:  Cooperative. Normal mood and affect.  Intake/Output from previous day: 12/04 0701 - 12/05 0700 In: 3945 [P.O.:1920; I.V.:2025] Out: 1650 [Urine:750; Stool:900]  Lab Results:  Recent Labs  11/14/16 0505 11/16/16 0825  WBC 4.0 4.7  HGB 9.2* 9.8*  HCT 26.9* 28.8*  PLT 131* 156   BMET  Recent Labs  11/14/16 0505 11/16/16 0825  NA 133* 138  K 4.6 3.8  CL 101 104  CO2 25 28  GLUCOSE 109* 97  BUN 9 5*  CREATININE 0.55* 0.53*  CALCIUM 7.4* 7.8*   LFT No results for input(s): PROT, ALBUMIN, AST, ALT, ALKPHOS, BILITOT, BILIDIR, IBILI in the last 72 hours. PT/INR No results for input(s): LABPROT, INR in the last 72 hours.  Studies/Results: No results found.     Assessment / Plan:   Assessment: 1. Severe constipation/fecal impaction: Overall he has had a SMOG enema, moviprep, gastrograffin enema and completed Golytely on 11/13/16 as well as a movi prep on 11/15/16 . A flexiseal was placed because of perineal irritation. Plans were originally for  colonoscopy today, the patient is not adequately prepped, we will continue on clear liquid diet and plan for repeat prep today 2. Abnormal CT abdomen: abnormal CT showing "cecal mass" per Dr. Pascal Lux in radiology, when he re-read the film  Plan: 1.  Colonoscopy scheduled for tomorrow at 1300 with Dr. Fuller Plan tomorrow for further eval of suspected cecal mass. Re-discussed risks, benefits and limitations and the patient agrees to proceed 2. Movi prep re-ordered. Discussed with nursing that they should keep a close eye on him to make sure the prep is consumed correctly and in a timely fashion. 3. Patient to remain on clear liquid diet today and NPO after midnight 4. Please await any further recommendations from Dr. Fuller Plan.   LOS: 8 days   Levin Erp  11/16/2016, 9:51 AM  Pager # [phone]    Attending physician's note   I have taken an interval history, reviewed the chart and examined the patient. I agree with the Advanced Practitioner's note, impression and recommendations.  Fecal impaction, resolved. Constipation, persists. Cecal mass on CT re-read. Pt passing brown liquid stool with prep. Prep not adequate will repeat the bowel prep today and schedule colonoscopy for tomorrow.   Lucio Edward, MD [facility] [phone] Mon-Fri 8a-5p [phone] after 5p, weekends, holidays

## 2016-11-17 ENCOUNTER — Inpatient Hospital Stay (HOSPITAL_COMMUNITY): Payer: PPO | Admitting: Certified Registered Nurse Anesthetist

## 2016-11-17 ENCOUNTER — Encounter (HOSPITAL_COMMUNITY): Payer: Self-pay

## 2016-11-17 ENCOUNTER — Encounter (HOSPITAL_COMMUNITY)
Admission: EM | Disposition: A | Discharge status: Skilled Nursing Facility | Payer: Self-pay | Source: Home / Self Care | Attending: Internal Medicine

## 2016-11-17 DIAGNOSIS — D120 Benign neoplasm of cecum: Secondary | ICD-10-CM

## 2016-11-17 DIAGNOSIS — E43 Unspecified severe protein-calorie malnutrition: Secondary | ICD-10-CM | POA: Diagnosis not present

## 2016-11-17 DIAGNOSIS — K5649 Other impaction of intestine: Secondary | ICD-10-CM | POA: Diagnosis not present

## 2016-11-17 DIAGNOSIS — K5900 Constipation, unspecified: Secondary | ICD-10-CM | POA: Diagnosis not present

## 2016-11-17 DIAGNOSIS — E871 Hypo-osmolality and hyponatremia: Secondary | ICD-10-CM | POA: Diagnosis not present

## 2016-11-17 DIAGNOSIS — I481 Persistent atrial fibrillation: Secondary | ICD-10-CM | POA: Diagnosis not present

## 2016-11-17 DIAGNOSIS — E119 Type 2 diabetes mellitus without complications: Secondary | ICD-10-CM | POA: Diagnosis not present

## 2016-11-17 DIAGNOSIS — K5901 Slow transit constipation: Secondary | ICD-10-CM | POA: Diagnosis not present

## 2016-11-17 DIAGNOSIS — N179 Acute kidney failure, unspecified: Secondary | ICD-10-CM | POA: Diagnosis not present

## 2016-11-17 DIAGNOSIS — E1122 Type 2 diabetes mellitus with diabetic chronic kidney disease: Secondary | ICD-10-CM | POA: Diagnosis not present

## 2016-11-17 DIAGNOSIS — I4892 Unspecified atrial flutter: Secondary | ICD-10-CM | POA: Diagnosis not present

## 2016-11-17 DIAGNOSIS — D696 Thrombocytopenia, unspecified: Secondary | ICD-10-CM | POA: Diagnosis not present

## 2016-11-17 DIAGNOSIS — R17 Unspecified jaundice: Secondary | ICD-10-CM | POA: Diagnosis not present

## 2016-11-17 DIAGNOSIS — I452 Bifascicular block: Secondary | ICD-10-CM | POA: Diagnosis not present

## 2016-11-17 DIAGNOSIS — E1141 Type 2 diabetes mellitus with diabetic mononeuropathy: Secondary | ICD-10-CM | POA: Diagnosis not present

## 2016-11-17 DIAGNOSIS — K626 Ulcer of anus and rectum: Secondary | ICD-10-CM | POA: Diagnosis not present

## 2016-11-17 DIAGNOSIS — K635 Polyp of colon: Secondary | ICD-10-CM

## 2016-11-17 DIAGNOSIS — K640 First degree hemorrhoids: Secondary | ICD-10-CM | POA: Diagnosis not present

## 2016-11-17 DIAGNOSIS — E1165 Type 2 diabetes mellitus with hyperglycemia: Secondary | ICD-10-CM | POA: Diagnosis not present

## 2016-11-17 DIAGNOSIS — K633 Ulcer of intestine: Secondary | ICD-10-CM | POA: Diagnosis not present

## 2016-11-17 DIAGNOSIS — R933 Abnormal findings on diagnostic imaging of other parts of digestive tract: Secondary | ICD-10-CM

## 2016-11-17 DIAGNOSIS — K5641 Fecal impaction: Secondary | ICD-10-CM | POA: Diagnosis not present

## 2016-11-17 DIAGNOSIS — D490 Neoplasm of unspecified behavior of digestive system: Secondary | ICD-10-CM | POA: Diagnosis not present

## 2016-11-17 HISTORY — PX: COLONOSCOPY: SHX5424

## 2016-11-17 LAB — GLUCOSE, CAPILLARY
GLUCOSE-CAPILLARY: 136 mg/dL — AB (ref 65–99)
Glucose-Capillary: 104 mg/dL — ABNORMAL HIGH (ref 65–99)
Glucose-Capillary: 99 mg/dL (ref 65–99)
Glucose-Capillary: 159 mg/dL — ABNORMAL HIGH (ref 65–99)

## 2016-11-17 SURGERY — COLONOSCOPY
Anesthesia: Monitor Anesthesia Care

## 2016-11-17 MED ORDER — EPHEDRINE SULFATE-NACL 50-0.9 MG/10ML-% IV SOSY
PREFILLED_SYRINGE | INTRAVENOUS | Status: DC | PRN
  Administered 2016-11-17: 5 mg via INTRAVENOUS
  Administered 2016-11-17: 10 mg via INTRAVENOUS
  Administered 2016-11-17: 5 mg via INTRAVENOUS

## 2016-11-17 MED ORDER — PROPOFOL 10 MG/ML IV BOLUS
INTRAVENOUS | Status: DC | PRN
  Administered 2016-11-17 (×5): 20 mg via INTRAVENOUS
  Administered 2016-11-17: 40 mg via INTRAVENOUS
  Administered 2016-11-17 (×4): 20 mg via INTRAVENOUS
  Administered 2016-11-17: 40 mg via INTRAVENOUS
  Administered 2016-11-17: 20 mg via INTRAVENOUS

## 2016-11-17 MED ORDER — LIDOCAINE 2% (20 MG/ML) 5 ML SYRINGE
INTRAMUSCULAR | Status: DC | PRN
  Administered 2016-11-17: 40 mg via INTRAVENOUS

## 2016-11-17 MED ORDER — LACTATED RINGERS IV SOLN
INTRAVENOUS | Status: DC
  Administered 2016-11-17: 14:00:00 via INTRAVENOUS

## 2016-11-17 MED ORDER — LIDOCAINE 2% (20 MG/ML) 5 ML SYRINGE
INTRAMUSCULAR | Status: AC
  Filled 2016-11-17: qty 5

## 2016-11-17 MED ORDER — ONDANSETRON HCL 4 MG/2ML IJ SOLN
INTRAMUSCULAR | Status: DC | PRN
  Administered 2016-11-17: 4 mg via INTRAVENOUS

## 2016-11-17 MED ORDER — PHENYLEPHRINE 40 MCG/ML (10ML) SYRINGE FOR IV PUSH (FOR BLOOD PRESSURE SUPPORT)
PREFILLED_SYRINGE | INTRAVENOUS | Status: DC | PRN
  Administered 2016-11-17 (×10): 80 ug via INTRAVENOUS

## 2016-11-17 MED ORDER — MIDAZOLAM HCL 2 MG/2ML IJ SOLN
INTRAMUSCULAR | Status: DC | PRN
  Administered 2016-11-17: 1 mg via INTRAVENOUS

## 2016-11-17 MED ORDER — ENOXAPARIN SODIUM 40 MG/0.4ML ~~LOC~~ SOLN
40.0000 mg | SUBCUTANEOUS | Status: AC
  Administered 2016-11-18: 40 mg via SUBCUTANEOUS
  Filled 2016-11-17: qty 0.4

## 2016-11-17 MED ORDER — POLYETHYLENE GLYCOL 3350 17 G PO PACK
17.0000 g | PACK | Freq: Three times a day (TID) | ORAL | Status: DC
  Administered 2016-11-17 – 2016-11-18 (×4): 17 g via ORAL
  Filled 2016-11-17 (×4): qty 1

## 2016-11-17 MED ORDER — ONDANSETRON HCL 4 MG/2ML IJ SOLN
INTRAMUSCULAR | Status: AC
  Filled 2016-11-17: qty 2

## 2016-11-17 MED ORDER — MIDAZOLAM HCL 2 MG/2ML IJ SOLN
INTRAMUSCULAR | Status: AC
  Filled 2016-11-17: qty 2

## 2016-11-17 MED ORDER — EPHEDRINE 5 MG/ML INJ
INTRAVENOUS | Status: AC
  Filled 2016-11-17: qty 10

## 2016-11-17 MED ORDER — LACTATED RINGERS IV SOLN
INTRAVENOUS | Status: DC | PRN
  Administered 2016-11-17 (×2): via INTRAVENOUS

## 2016-11-17 MED ORDER — SPOT INK MARKER SYRINGE KIT
PACK | SUBMUCOSAL | Status: DC | PRN
  Administered 2016-11-17: 2 mL via SUBMUCOSAL

## 2016-11-17 MED ORDER — PHENYLEPHRINE 40 MCG/ML (10ML) SYRINGE FOR IV PUSH (FOR BLOOD PRESSURE SUPPORT)
PREFILLED_SYRINGE | INTRAVENOUS | Status: AC
  Filled 2016-11-17: qty 10

## 2016-11-17 MED ORDER — PROPOFOL 10 MG/ML IV BOLUS
INTRAVENOUS | Status: AC
  Filled 2016-11-17: qty 20

## 2016-11-17 MED ORDER — SPOT INK MARKER SYRINGE KIT
PACK | SUBMUCOSAL | Status: AC
  Filled 2016-11-17: qty 5

## 2016-11-17 MED ORDER — ONDANSETRON HCL 4 MG/2ML IJ SOLN
4.0000 mg | Freq: Once | INTRAMUSCULAR | Status: DC | PRN
Start: 2016-11-17 — End: 2016-11-17

## 2016-11-17 MED ORDER — FENTANYL CITRATE (PF) 100 MCG/2ML IJ SOLN: 25.0000 ug | INTRAMUSCULAR | Status: DC | PRN

## 2016-11-17 NOTE — Op Note (Signed)
[facility] Patient Name: [name] Procedure Date: 11/17/2016 MRN: [id number] Attending MD: [name] , MD Date of Birth: [birth year] CSN: [id number] Age: 65 Admit Type: Inpatient Procedure:                Colonoscopy Indications:              Abnormal CT of the GI tract, Constipation, Fecal                            impaction Providers:                Pricilla Riffle. Fuller Plan, MD, Zenon Mayo, RN, Despina Pole Tech, Technician, Glenis Smoker, CRNA Referring MD:             [facility] Medicines:                Monitored Anesthesia Care Complications:            No immediate complications. Estimated blood loss:                            None. Estimated Blood Loss:     Estimated blood loss: none. Procedure:                Pre-Anesthesia Assessment:                           - Prior to the procedure, a History and Physical                            was performed, and patient medications and                            allergies were reviewed. The patient's tolerance of                            previous anesthesia was also reviewed. The risks                            and benefits of the procedure and the sedation                            options and risks were discussed with the patient.                            All questions were answered, and informed consent                            was obtained. Prior Anticoagulants: The patient has                            taken no previous anticoagulant or antiplatelet                            agents.  ASA Grade Assessment: III - A patient with                            severe systemic disease. After reviewing the risks                            and benefits, the patient was deemed in                            satisfactory condition to undergo the procedure.                           After obtaining informed consent, the colonoscope                            was passed under  direct vision. Throughout the                            procedure, the patient's blood pressure, pulse, and                            oxygen saturations were monitored continuously. The                            [id number]) scope was introduced through                            the anus and advanced to the the cecum, identified                            by appendiceal orifice and ileocecal valve. The                            ileocecal valve, appendiceal orifice, and rectum                            were photographed. The quality of the bowel                            preparation was adequate. The colonoscopy was                            performed without difficulty. The patient tolerated                            the procedure well. Scope In: 2:14:30 PM Scope Out: 2:41:10 PM Scope Withdrawal Time: 0 hours 22 minutes 16 seconds  Total Procedure Duration: 0 hours 26 minutes 40 seconds  Findings:      The perianal and digital rectal examinations were normal.      A 45 mm x 35 mm polyp was found in the cecum. The polyp was sessile with       a depressed central region. The polyp was not amenable to safe and       complete polypectomy. Biopsies  were taken with a cold forceps for       histology. Area was tattooed with an injection of 2 mL of Spot (carbon       black).      Internal hemorrhoids were found during retroflexion. The hemorrhoids       were small and Grade I (internal hemorrhoids that do not prolapse).      A few 6-8 mm ulcers were found in the rectum and in the recto-sigmoid       colon c/w stercoral ulcers. No bleeding was present. No stigmata of       recent bleeding were seen. Biopsies were taken with a cold forceps for       histology.      Multiple medium-mouthed diverticula were found in the sigmoid colon and       descending colon. There was narrowing of the colon in association with       the diverticular opening. There was evidence of diverticular  spasm.       Erythema was seen in association with the diverticular opening. There       was no evidence of diverticular bleeding.      The exam was otherwise without abnormality on direct and retroflexion       views. Impression:               - One 45 mm x 35 mm polyp in the cecum. Biopsied.                            Tattooed.                           - Internal hemorrhoids.                           - A few ulcers in the rectum and in the                            recto-sigmoid colon c/w stercoral ulcers. Biopsied.                           - Moderate diverticulosis in the sigmoid colon and                            in the descending colon.                           - The examination was otherwise normal on direct                            and retroflexion views. Moderate Sedation:      N/A- Per Anesthesia Care Recommendation:           - Repeat colonoscopy with Dr Scarlette Shorts in 1-3                            years for surveillance pending polyp pathology                            review.                           -  Patient has a contact number available for                            emergencies. The signs and symptoms of potential                            delayed complications were discussed with the                            patient. Return to normal activities tomorrow.                            Written discharge instructions were provided to the                            patient.                           - Resume previous diet.                           - Continue present medications.                           - Await pathology results.                           - Refer to a surgeon today for consideration of                            segmental colon resection to remove polyp. Procedure Code(s):        --- Professional ---                           [phone], Colonoscopy, flexible; with directed                            submucosal injection(s), any substance                            [id number], Colonoscopy, flexible; with biopsy, single                            or multiple Diagnosis Code(s):        --- Professional ---                           D12.0, Benign neoplasm of cecum                           K62.6, Ulcer of anus and rectum                           K63.3, Ulcer of intestine                           K64.0, First degree hemorrhoids  K59.00, Constipation, unspecified                           K57.30, Diverticulosis of large intestine without                            perforation or abscess without bleeding                           R93.3, Abnormal findings on diagnostic imaging of                            other parts of digestive tract CPT copyright 2016 [employer]. All rights reserved. The codes documented in this report are preliminary and upon coder review may  be revised to meet current compliance requirements. [name], MD 11/17/2016 2:55:46 PM This report has been signed electronically. Number of Addenda: 0

## 2016-11-17 NOTE — Anesthesia Postprocedure Evaluation (Signed)
Anesthesia Post Note  Patient: [name]  Procedure(s) Performed: Procedure(s) (LRB): COLONOSCOPY (N/A)  Patient location during evaluation: Endoscopy Anesthesia Type: MAC Level of consciousness: awake and alert Pain management: pain level controlled Vital Signs Assessment: post-procedure vital signs reviewed and stable Respiratory status: spontaneous breathing, nonlabored ventilation, respiratory function stable and patient connected to nasal cannula oxygen Cardiovascular status: stable and blood pressure returned to baseline Anesthetic complications: no    Last Vitals:  Vitals:   11/17/16 1454 11/17/16 1455  BP: 103/61 103/61  Pulse: 86 (!) 34  Resp: 13 15  Temp:  36.5 C    Last Pain:  Vitals:   11/17/16 1455  TempSrc: Oral  PainSc:                  Catalina Gravel

## 2016-11-17 NOTE — Progress Notes (Signed)
Patient Demographics:    [name], is a 65 y.o. male, DOB - [birth year], HR:[id number]  Admit date - 11/08/2016   Admitting Physician Karmen Bongo, MD  Outpatient Primary MD for the patient is Sandi Mariscal, MD  LOS - 9   Chief Complaint  Patient presents with  . Constipation  . Weakness        Subjective:    [name] today has no fevers, no emesis,  No chest pain,  Wife and daughter at bedside, pt later developed HA and speech deficits (Expressive aphasia) concerning for acute stroke   Assessment  & Plan :    Principal Problem:   Constipation Active Problems:   Essential hypertension, benign   Atrial fibrillation (HCC)   Diabetes mellitus type 2 in obese (HCC)   Hyponatremia   Urinary tract infection without hematuria   Hypokalemia   CKD (chronic kidney disease) stage 3, GFR 30-59 ml/min   Hyperbilirubinemia   Homelessness   Protein-calorie malnutrition, severe   Impaction of colon (HCC)   Abdominal pain   Generalized weakness   Fecal impaction (HCC)   AKI (acute kidney injury) ([place])  Brief Narrative:  65 y.o.malewith medical history significant ofA. Fib not on anticoagulation, DM Type 2, HTN, HLD, depression who presents initially with complaints of uncontrolled constipation. Patient admitted to being essentially noncompliant with any of his regimen for the past several years. In the emergency department, patient was noted to have symptomatically hyponatremia and tachycardia. Patient was also mildly hypotensive, receiving IV fluid hydration. Patient also found to have significant stool burden on CT abdomen pelvis. Fecal distraction attempt was without success. Smog enema yielded limited success. The patient was ultimately admitted for further management of multiple medical issues. Planning to have colonoscopy in am after 3rd bowel prep.  Plan:- 1)Possible Cecal Mass/Fecal  Impaction/Constipation- for Colonoscopy on 11/17/2016, constipation/Fecal impaction appears to have Resolved  2) A. fib with RVR- CHADVASC2 score is 2, refuses anticoagulation, history of noncompliance, continue metoprolol and digoxin as per cardiology,  get echocardiogram  3)H/o Glucose intolerance-most recent A1c is 5.2, avoid overaggressive control in order to avoid life-threatening hypoglycemia  4) BPH with urinary retention- currently has Foley catheter,  (patient had in and out cath couple of times prior), continue Flomax, attempts voiding trial and Foley discontinuation if able on 11/17/2016  5)AKI- resolved, creatinine is back to 0.5, most likely was due to BPH with urinary retention and dehydration in the setting of possible UTI. Urine culture was negative, patient completed antibiotics.   6)Severe Protein Caloric malnutrition- continue supplements  7)Social/Dispo- patient with deconditioning and debility, patient is essentially homeless, Education officer, museum consult for disposition   Consults  :  Gi   DVT Prophylaxis  :   SCDs /Lovenox  Lab Results  Component Value Date   PLT 156 11/16/2016    Inpatient Medications  Scheduled Meds: . busPIRone  5 mg Oral TID  . digoxin  0.125 mg Oral Daily  . docusate sodium  100 mg Oral BID  . [START ON 11/18/2016] enoxaparin (LOVENOX) injection  40 mg Subcutaneous Q24H  . feeding supplement  1 Container Oral QID  . hydrocortisone   Rectal BID  . insulin aspart  0-15 Units Subcutaneous TID WC  .  metoprolol tartrate  50 mg Oral BID  . multivitamin with minerals  1 tablet Oral Daily  . sodium chloride flush  3 mL Intravenous Q12H  . tamsulosin  0.4 mg Oral Daily   Continuous Infusions: . sodium chloride 20 mL/hr at 11/17/16 0642  . 0.9 % NaCl with KCl 20 mEq / L Stopped (11/17/16 0642)   PRN Meds:.acetaminophen **OR** acetaminophen, bisacodyl, lidocaine, liver oil-zinc oxide, LORazepam, ondansetron **OR** ondansetron (ZOFRAN) IV,  traMADol    Anti-infectives    Start     Dose/Rate Route Frequency Ordered Stop   11/10/16 2000  cefTRIAXone (ROCEPHIN) 1 g in dextrose 5 % 50 mL IVPB     1 g 100 mL/hr over 30 Minutes Intravenous Every 24 hours 11/10/16 1847 11/14/16 2236   11/09/16 2000  cefTRIAXone (ROCEPHIN) 2 g in dextrose 5 % 50 mL IVPB  Status:  Discontinued     2 g 100 mL/hr over 30 Minutes Intravenous Every 24 hours 11/09/16 0312 11/10/16 1847   11/08/16 2045  cefTRIAXone (ROCEPHIN) 2 g in dextrose 5 % 50 mL IVPB     2 g 100 mL/hr over 30 Minutes Intravenous  Once 11/08/16 2042 11/08/16 2229        Objective:   Vitals:   11/16/16 1450 11/16/16 2125 11/16/16 2314 11/17/16 0644  BP: (!) 98/59 90/77 99/68  113/67  Pulse: 66 (!) 119 (!) 127 68  Resp: 18 18  18   Temp: 97.7 F (36.5 C) 99 F (37.2 C)  98.3 F (36.8 C)  TempSrc: Oral Oral  Oral  SpO2: 100% 97%  96%  Weight:      Height:        Wt Readings from Last 3 Encounters:  11/09/16 93.4 kg (205 lb 14.6 oz)  08/06/14 (!) 157.3 kg (346 lb 12.8 oz)  08/02/14 (!) 157.6 kg (347 lb 8 oz)     Intake/Output Summary (Last 24 hours) at 11/17/16 0801 Last data filed at 11/17/16 0755  Gross per 24 hour  Intake          2758.41 ml  Output              650 ml  Net          2108.41 ml     Physical Exam  Gen:- Awake Alert,  In no apparent distress  HEENT:- [name] [place].AT, No sclera icterus Neck-Supple Neck,No JVD,.  Lungs-  CTAB ,  CV- S1, S2 normal, irregular Abd-  +ve B.Sounds, Abd Soft, Mild lower abdominal tenderness,   Extremity/Skin:- No  edema,       Data Review:   Micro Results Recent Results (from the past 240 hour(s))  Urine culture     Status: Abnormal   Collection Time: 11/08/16  7:47 PM  Result Value Ref Range Status   Specimen Description URINE, CLEAN CATCH  Final   Special Requests NONE  Final   Culture MULTIPLE SPECIES PRESENT, SUGGEST RECOLLECTION (A)  Final   Report Status 11/10/2016 FINAL  Final    Radiology Reports Dg  Abdomen 1 View  Result Date: 11/08/2016 CLINICAL DATA:  Constipation over the last month. EXAM: ABDOMEN - 1 VIEW COMPARISON:  None. FINDINGS: Gas pattern does not suggest ileus or obstruction. Amount of fecal matter in the colon is within normal limits with the exception of slightly prominent stool in the rectal vault. No significant soft tissue calcifications. Some vascular calcification noted. Chronic degenerative changes affect the spine. IMPRESSION: No markedly abnormal findings. Overall amount of fecal  matter appears within normal limits. Slightly prominent stool in the rectal vault. Electronically Signed   By: Nelson Chimes M.D.   On: 11/08/2016 14:29   Ct Head Wo Contrast  Result Date: 11/08/2016 CLINICAL DATA:  Generalized weakness. Poor historian. Difficulty raising right arm. EXAM: CT HEAD WITHOUT CONTRAST TECHNIQUE: Contiguous axial images were obtained from the base of the skull through the vertex without intravenous contrast. COMPARISON:  None. FINDINGS: Brain: Ventricles and cisterns are within normal. There is minimal prominence of the CSF spaces compatible with age related atrophy. There is no mass, mass effect, shift of midline structures or acute hemorrhage. No evidence of acute infarction. Vascular: Minimal calcified plaque over the cavernous segment of the internal carotid arteries. Skull: Within normal. Sinuses/Orbits: Within normal. IMPRESSION: No acute intracranial findings. Electronically Signed   By: Marin Olp M.D.   On: 11/08/2016 15:56   Ct Abdomen Pelvis W Contrast  Addendum Date: 11/15/2016   ADDENDUM REPORT: 11/15/2016 08:56 ADDENDUM: Case reviewed prior to barium enema. There is sessile and lobulated mural filling defect in the cecum on the right lateral wall measuring 3 cm in diameter by 12 mm in thickness. While adherent stool can give this appearance, finding is more concerning for a mass. Case discussed with Dr. Fuller Plan via telephone at time of addendum. Patient is  under consideration for colonoscopy. Electronically Signed   By: Monte Fantasia M.D.   On: 11/15/2016 08:56   Result Date: 11/15/2016 CLINICAL DATA:  Initial evaluation for weakness, weight loss, constipation. EXAM: CT ABDOMEN AND PELVIS WITH CONTRAST TECHNIQUE: Multidetector CT imaging of the abdomen and pelvis was performed using the standard protocol following bolus administration of intravenous contrast. CONTRAST:  133mL ISOVUE-300 IOPAMIDOL (ISOVUE-300) INJECTION 61% COMPARISON:  Prior radiograph from earlier the same day. FINDINGS: Lower chest: Mild scattered atelectatic changes present within the visualized lung bases. Visualized lungs are otherwise clear. No pleural or pericardial fusion. Hepatobiliary: Liver demonstrates a normal contrast enhanced appearance. Gallbladder within normal limits. No biliary dilatation. Pancreas: Pancreas within normal limits. Spleen: Scattered hypodense lesions noted within the spleen, largest of which measures 9 mm. These are indeterminate, but are of doubtful clinical significance. Spleen otherwise unremarkable. Adrenals/Urinary Tract: 2.3 cm hypodense nodule noted within the right adrenal gland, most likely a small adenoma. Left adrenal gland within normal limits. Kidneys equal in size with symmetric enhancement. Scattered parapelvic cyst noted within the left kidney. Few scattered calcifications within the left renal hilum suspicious for small nonobstructive calculi. These measure up to 3 mm. No hydronephrosis. No focal enhancing renal mass. Scattered cortical thinning/scarring noted. Ureters are of normal caliber without acute abnormality. Bladder within normal limits. Stomach/Bowel: Small hiatal hernia noted. Stomach otherwise unremarkable. Small bowel within normal limits for caliber without evidence for obstruction. Colonic diverticulosis without evidence for acute diverticulitis. Appendix not visualize, compatible with history of prior appendectomy. No abnormal wall  thickening, mucosal enhancement, or inflammatory fat stranding seen about the bowels. Large amount of retained stool within the rectal vault, suggesting constipation. Vascular/Lymphatic: Normal intravascular enhancement seen throughout the intra-abdominal aorta and its branch vessels. Moderate aorto bi-iliac atherosclerotic disease. No aneurysm. No pathologically enlarged intra-abdominal or pelvic lymph nodes. Reproductive: Prostate within normal limits. Other: No free air or fluid. Small bilateral fat containing inguinal hernias noted. Musculoskeletal: No acute osseous abnormality. No worrisome lytic or blastic osseous lesions. Moderate multilevel degenerative spondylolysis noted within the visualized spine. IMPRESSION: 1. No CT evidence for acute intra-abdominal or pelvic process. 2. Large amount of retained stool  within the rectal vault, suggesting constipation. 3. Sigmoid diverticulosis without evidence for acute diverticulitis. 4. Question punctate nonobstructive left renal nephrolithiasis. Electronically Signed: By: Jeannine Boga M.D. On: 11/08/2016 18:08   Dg Abd 2 Views  Result Date: 11/10/2016 CLINICAL DATA:  Acute generalized abdominal pain. EXAM: ABDOMEN - 2 VIEW COMPARISON:  CT scan of November 08, 2016. FINDINGS: The bowel gas pattern is normal. There is no evidence of free air. No radio-opaque calculi or other significant radiographic abnormality is seen. Stool is noted in the rectum. IMPRESSION: No definite evidence of bowel obstruction or ileus. Electronically Signed   By: Marijo Conception, M.D.   On: 11/10/2016 15:53   Dg Colon Therapeutic W/cm  Result Date: 11/12/2016 CLINICAL DATA:  Severe constipation. EXAM: WATER SOLUBLE CONTRAST ENEMA TECHNIQUE: Approximately 3 L of warm water mixed with 480 cc of Gastrografin was instilled per rectum. FLUOROSCOPY TIME:  Radiation Exposure Index (if provided by the fluoroscopic device): 183.47 mGy COMPARISON:  Radiographs dated 11/10/2016  FINDINGS: EKG knee demonstrates moderate stool in the ascending colon as well as in the rectum. The study shows a 6 mm stool ball in the rectum. There is also stool throughout the remainder of the colon particularly in the cecum. There are few diverticula in the sigmoid portion the colon. The study was suboptimal due to patient's inability to move well during the exam. Multiple smaller stool balls throughout the colon. I was able to get some contrast into the ascending colon. There are no discrete strictures or definitive mass lesions. The patient was unable to sit up to evacuate the contrast at the end of the exam. IMPRESSION: Extensive stool in the distal colon as well as in the ascending colon. No dilatation of the colon. Scattered diverticula in the sigmoid portion of the colon. No discrete obstruction or mass lesion. However, the study is suboptimal for detail because of the patient's inability to retain the contrast or move well during the exam. Electronically Signed   By: Lorriane Shire M.D.   On: 11/12/2016 12:46     CBC  Recent Labs Lab 11/12/16 0430 11/14/16 0505 11/16/16 0825  WBC 4.1 4.0 4.7  HGB 10.1* 9.2* 9.8*  HCT 28.7* 26.9* 28.8*  PLT 114* 131* 156  MCV 95.7 97.8 99.0  MCH 33.7 33.5 33.7  MCHC 35.2 34.2 34.0  RDW 14.5 14.9 15.4    Chemistries   Recent Labs Lab 11/12/16 0430 11/14/16 0505 11/16/16 0825  NA 131* 133* 138  K 4.5 4.6 3.8  CL 100* 101 104  CO2 23 25 28   GLUCOSE 106* 109* 97  BUN 11 9 5*  CREATININE 0.61 0.55* 0.53*  CALCIUM 7.3* 7.4* 7.8*  AST 11*  --   --   ALT 7*  --   --   ALKPHOS 48  --   --   BILITOT 0.6  --   --    ------------------------------------------------------------------------------------------------------------------ No results for input(s): CHOL, HDL, LDLCALC, TRIG, CHOLHDL, LDLDIRECT in the last 72 hours.  Lab Results  Component Value Date   HGBA1C 5.2 11/09/2016    ------------------------------------------------------------------------------------------------------------------ No results for input(s): TSH, T4TOTAL, T3FREE, THYROIDAB in the last 72 hours.  Invalid input(s): FREET3 ------------------------------------------------------------------------------------------------------------------ No results for input(s): VITAMINB12, FOLATE, FERRITIN, TIBC, IRON, RETICCTPCT in the last 72 hours.  Coagulation profile No results for input(s): INR, PROTIME in the last 168 hours.  No results for input(s): DDIMER in the last 72 hours.  Cardiac Enzymes No results for input(s): CKMB, TROPONINI, MYOGLOBIN in  the last 168 hours.  Invalid input(s): CK ------------------------------------------------------------------------------------------------------------------    Component Value Date/Time   BNP 94.3 08/15/2013 1834     Digna Countess M.D on 11/17/2016 at 8:01 AM  Between 7am to 7pm - Pager - [phone]  After 7pm go to [email or web address] - password [email or web address]  [facility] -  Office  [phone]  Dragon dictation system was used to create this note, attempts have been made to correct errors, however presence of uncorrected errors is not a reflection quality of care provided

## 2016-11-17 NOTE — Transfer of Care (Signed)
Immediate Anesthesia Transfer of Care Note  Patient: [name]  Procedure(s) Performed: Procedure(s): COLONOSCOPY (N/A)  Patient Location: PACU and Endoscopy Unit  Anesthesia Type:MAC  Level of Consciousness: awake and alert   Airway & Oxygen Therapy: Patient Spontanous Breathing and Patient connected to face mask  Post-op Assessment: Report given to RN and Post -op Vital signs reviewed and stable  Post vital signs: Reviewed and stable  Last Vitals:  Vitals:   11/17/16 1344 11/17/16 1450  BP: 108/63   Pulse: 78   Resp: (!) 22 10  Temp: 36.6 C     Last Pain:  Vitals:   11/17/16 1344  TempSrc: Oral  PainSc:       Patients Stated Pain Goal: 3 ([id number])  Complications: No apparent anesthesia complications

## 2016-11-17 NOTE — Consult Note (Signed)
[facility] Surgery Consult Note  [name] September 26, 1951  [id number].    Requesting MD: Fuller Plan Chief Complaint/Reason for Consult: Colon mass  HPI:  [name] is a 65yo male admitted to [facility] 11/08/16 with severe constipation. He is homeless and has a PMH significant for atrial fibrillation, DM Type 2, HTN, HLD, and depression but he had not been taking medication for any of these problems and has not seen a physician in 2 years. States that he has been suffering with intermittent constipation since April/May 2017. At one point he stopped eating in fear of not being able to have a bowel movement and lost >100lb. He came to the ED because he had only had 1 BM in 2 months. Patient was mildly hypotensive, tachycardic, and hyponatremic on arrival. Manual disimpaction in the ED failed. CT scan demonstrated concern for a potential colonic mass. In preparation for colonoscopy to evaluate the mass he received multiple bowel preps. Colonoscopy was performed 11/17/16 and showed one benign appearing 45x35 mm polyp in the cecum that was biopsied, stercoral ulcers that were biopsied, and moderate diverticulosis in the sigmoid colon and in the descending colon.  General surgery consulted for evaluation/possible surgical removal of the mass. Patient states that he is currently having no abdominal pain, nausea, or vomiting. He would like to proceed with surgery during this admission.  PMH significant for Atrial fibrillation (not on anticoagulation), DM2, HTN, HLD, Depression, Homeless Abdominal surgical history includes appendectomy age 35 Denies family history of colon cancer Homeless  ROS: Review of Systems  Constitutional: Positive for weight loss.  Respiratory: Negative for cough and wheezing.   Cardiovascular: Negative for chest pain.  Gastrointestinal: Positive for abdominal pain and constipation.   All other systems reviewed and are otherwise negative except as noted above.  Family History   Problem Relation Age of Onset  . Brain cancer Mother   . Alzheimer's disease Father   . Cancer Maternal Grandmother   . Heart attack Maternal Grandfather   . Pneumonia Paternal Grandfather     Past Medical History:  Diagnosis Date  . Atrial fibrillation ([place])   . Cataract   . Diabetes mellitus without complication ([place])   . Hyperlipidemia   . Hypertension   . Lichen planus    bilateral legs    Past Surgical History:  Procedure Laterality Date  . APPENDECTOMY    . FRACTURE SURGERY    . MULTIPLE TOOTH EXTRACTIONS      Social History:  reports that he quit smoking about 33 years ago. His smoking use included Pipe. He has never used smokeless tobacco. He reports that he does not drink alcohol or use drugs.  Allergies:  Allergies  Allergen Reactions  . Other     [name] bandage (self-gripping) - erythema, itching    Medications Prior to Admission  Medication Sig Dispense Refill  . buPROPion (WELLBUTRIN XL) 150 MG 24 hr tablet Take 150 mg by mouth daily.    . cyanocobalamin 500 MCG tablet Take 500 mcg by mouth daily.    . Multiple Vitamin (MULTIVITAMIN) capsule Take 1 capsule by mouth daily.    . Omega 3 1200 MG CAPS Take 2 capsules by mouth daily.    [name] OVER THE COUNTER MEDICATION OTC Calcium w/ Vitamin D 3 taking    . OVER THE COUNTER MEDICATION OTC Magnesium 400 mg taking    . ranitidine (ZANTAC) 150 MG capsule Take 150 mg by mouth as needed for heartburn.  Blood pressure 112/81, pulse (!) 47, temperature 97.7 F (36.5 C), temperature source Oral, resp. rate 16, height '6\' 3"'  (1.905 m), weight 205 lb (93 kg), SpO2 99 %. Physical Exam: General: WD/WN white male who is laying in bed in NAD HEENT: head is normocephalic, atraumatic.  Sclera are noninjected.  Mouth is dry Heart: irregular, regular rate.  No obvious murmurs, gallops, or rubs noted.  Palpable pedal pulses bilaterally Lungs: CTAB, no wheezes, rhonchi, or rales noted.  Respiratory effort  nonlabored Abd: soft, NT/ND, +BS, no masses, hernias, or organomegaly MS: all 4 extremities are symmetrical with no cyanosis, clubbing, or edema. Skin: warm and dry with no masses, lesions, or rashes Psych: A&Ox3 with an appropriate affect. Neuro: CM 2-12 intact, extremity CSM intact bilaterally, normal speech  Results for orders placed or performed during the hospital encounter of 11/08/16 (from the past 48 hour(s))  Glucose, capillary     Status: Abnormal   Collection Time: 11/15/16  4:48 PM  Result Value Ref Range   Glucose-Capillary 122 (H) 65 - 99 mg/dL   Comment 1 Notify RN    Comment 2 Document in Chart   Glucose, capillary     Status: Abnormal   Collection Time: 11/15/16  8:55 PM  Result Value Ref Range   Glucose-Capillary 127 (H) 65 - 99 mg/dL  Glucose, capillary     Status: None   Collection Time: 11/16/16  8:17 AM  Result Value Ref Range   Glucose-Capillary 96 65 - 99 mg/dL  CBC     Status: Abnormal   Collection Time: 11/16/16  8:25 AM  Result Value Ref Range   WBC 4.7 4.0 - 10.5 K/uL   RBC 2.91 (L) 4.22 - 5.81 MIL/uL   Hemoglobin 9.8 (L) 13.0 - 17.0 g/dL   HCT 28.8 (L) 39.0 - 52.0 %   MCV 99.0 78.0 - 100.0 fL   MCH 33.7 26.0 - 34.0 pg   MCHC 34.0 30.0 - 36.0 g/dL   RDW 15.4 11.5 - 15.5 %   Platelets 156 150 - 400 K/uL  Basic metabolic panel     Status: Abnormal   Collection Time: 11/16/16  8:25 AM  Result Value Ref Range   Sodium 138 135 - 145 mmol/L   Potassium 3.8 3.5 - 5.1 mmol/L   Chloride 104 101 - 111 mmol/L   CO2 28 22 - 32 mmol/L   Glucose, Bld 97 65 - 99 mg/dL   BUN 5 (L) 6 - 20 mg/dL   Creatinine, Ser 0.53 (L) 0.61 - 1.24 mg/dL   Calcium 7.8 (L) 8.9 - 10.3 mg/dL   GFR calc non Af Amer >60 >60 mL/min   GFR calc Af Amer >60 >60 mL/min    Comment: (NOTE) The eGFR has been calculated using the CKD EPI equation. This calculation has not been validated in all clinical situations. eGFR's persistently <60 mL/min signify possible Chronic  Kidney Disease.    Anion gap 6 5 - 15  Glucose, capillary     Status: Abnormal   Collection Time: 11/16/16 12:24 PM  Result Value Ref Range   Glucose-Capillary 112 (H) 65 - 99 mg/dL  Glucose, capillary     Status: Abnormal   Collection Time: 11/16/16  5:40 PM  Result Value Ref Range   Glucose-Capillary 106 (H) 65 - 99 mg/dL  Glucose, capillary     Status: Abnormal   Collection Time: 11/16/16  9:31 PM  Result Value Ref Range   Glucose-Capillary 105 (H) 65 - 99  mg/dL   Comment 1 Notify RN   Glucose, capillary     Status: Abnormal   Collection Time: 11/17/16  8:03 AM  Result Value Ref Range   Glucose-Capillary 104 (H) 65 - 99 mg/dL  Glucose, capillary     Status: None   Collection Time: 11/17/16 11:40 AM  Result Value Ref Range   Glucose-Capillary 99 65 - 99 mg/dL   No results found.    Assessment/Plan Colonic mass - patient admitted to [facility] 11/27 with severe constipation. He had had intermittent constipation since April/May 2017 - CT scan demonstrated concern for a potential colonic mass - Colonoscopy performed 11/17/16 and showed one benign appearing 45x35 mm polyp in the cecum that was biopsied, stercoral ulcers that were biopsied, and moderate diverticulosis in the sigmoid colon and in the descending colon - prior to this patient had never had a colonoscopy before  HTN Atrial fibrillation DM Acute on chronic renal failure Physical deconditioning  Severe protein calorie malnutrition Homeless  VTE - Lovenox, SCDs FEN - full liquids  Plan - I saw and evaluated this patient with Dr. Hassell Done. Plan to proceed with surgical intervention for segmental colon resection, tentatively for Friday. Recommend clear liquid diet until surgery.   Jerrye Beavers, [facility] Surgery 11/17/2016, 3:31 PM Pager: [phone] Consults: [phone] Mon-Fri 7:00 am-4:30 pm Sat-Sun 7:00 am-11:30 am

## 2016-11-17 NOTE — H&P (View-Only) (Signed)
    Progress Note   Subjective  Chief Complaint: Constipation/fecal impaction  Today, patient is again found laying in bed. He tells me that when he tries to strain he still feels as though there is something in the way of his rectum when pushing, "like an obstruction". He tells me the prep went pretty well overnight, but "not as good as I wanted". He tells me that he had to push to have a bowel movement and did not have totally liquid stools. He again tells me he is worried about being discharged from the hospital later today. He is thirsty as he has not had anything to drink since midnight.  Per nursing, patient did have clear/yellow discharge from his rectum this morning at time of cleaning, no further brown stool    Objective   Vital signs in last 24 hours: Temp:  [97.7 F (36.5 C)-99 F (37.2 C)] 98.3 F (36.8 C) (12/06 0644) Pulse Rate:  [66-127] 68 (12/06 0644) Resp:  [18] 18 (12/06 0644) BP: (90-113)/(59-77) 113/67 (12/06 0644) SpO2:  [96 %-100 %] 96 % (12/06 0644) Last BM Date: 11/17/16 General: Obese male in NAD Heart:  Regular rate and rhythm; no murmurs Lungs: Respirations even and unlabored, lungs CTA bilaterally Abdomen:  Soft, nontender and nondistended. Normal bowel sounds. Extremities:  Without edema. Neurologic:  Alert and oriented,  grossly normal neurologically. Psych:  Cooperative. Normal mood and affect.  Intake/Output from previous day: 12/05 0701 - 12/06 0700 In: 2483.4 [P.O.:720; I.V.:1213.4] Out: 650 [Urine:650]  Lab Results:  Recent Labs  11/16/16 0825  WBC 4.7  HGB 9.8*  HCT 28.8*  PLT 156   BMET  Recent Labs  11/16/16 0825  NA 138  K 3.8  CL 104  CO2 28  GLUCOSE 97  BUN 5*  CREATININE 0.53*  CALCIUM 7.8*   LFT No results for input(s): PROT, ALBUMIN, AST, ALT, ALKPHOS, BILITOT, BILIDIR, IBILI in the last 72 hours. PT/INR No results for input(s): LABPROT, INR in the last 72 hours.  Studies/Results: No results found.     Assessment / Plan:   Assessment: 1. Severe constipation/fecal impaction: Overall he has had a SMOG enema, moviprep, gastrograffin enema and completed Golytely on 11/13/16 as well as a movi prep on 11/15/16 and again on 11/16/16 .Patient scheduled for colo today 2. Abnormal CT of the abdomen: cecal mass  Plan: 1. Colonoscopy scheduled for this afternoon with Dr. Fuller Plan. 2. Patient to remain nothing by mouth until after time procedure 3. Please await any further recommendations from Dr. Fuller Plan after time procedure today.    LOS: 9 days   [name]  11/17/2016, 10:41 AM  Pager # [phone]    Attending physician's note   I have taken an interval history, reviewed the chart and examined the patient. I agree with the Advanced Practitioner's note, impression and recommendations.   Lucio Edward, MD [facility] [phone] Mon-Fri 8a-5p [phone] after 5p, weekends, holidays

## 2016-11-17 NOTE — Anesthesia Preprocedure Evaluation (Signed)
Anesthesia Evaluation  Patient identified by MRN, date of birth, ID band Patient awake    Reviewed: Allergy & Precautions, NPO status , Patient's Chart, lab work & pertinent test results  Airway Mallampati: III  TM Distance: >3 FB Neck ROM: Full    Dental  (+) Dental Advisory Given, Edentulous Lower, Edentulous Upper   Pulmonary former smoker,    Pulmonary exam normal breath sounds clear to auscultation       Cardiovascular hypertension, + dysrhythmias Atrial Fibrillation  Rhythm:Irregular Rate:Normal     Neuro/Psych negative neurological ROS  negative psych ROS   GI/Hepatic Neg liver ROS, GERD  Medicated,  Endo/Other  diabetes, Type 2  Renal/GU Renal InsufficiencyRenal disease     Musculoskeletal  (+) Arthritis ,   Abdominal   Peds  Hematology  (+) Blood dyscrasia, anemia ,   Anesthesia Other Findings Day of surgery medications reviewed with the patient.  Reproductive/Obstetrics                             Anesthesia Physical Anesthesia Plan  ASA: III  Anesthesia Plan: MAC   Post-op Pain Management:    Induction: Intravenous  Airway Management Planned: Nasal Cannula  Additional Equipment:   Intra-op Plan:   Post-operative Plan:   Informed Consent: I have reviewed the patients History and Physical, chart, labs and discussed the procedure including the risks, benefits and alternatives for the proposed anesthesia with the patient or authorized representative who has indicated his/her understanding and acceptance.   Dental advisory given  Plan Discussed with: CRNA and Anesthesiologist  Anesthesia Plan Comments: (Discussed risks/benefits/alternatives to MAC sedation including need for ventilatory support, hypotension, need for conversion to general anesthesia.  All patient questions answered.  Patient/guardian wishes to proceed.)        Anesthesia Quick Evaluation

## 2016-11-17 NOTE — Interval H&P Note (Signed)
History and Physical Interval Note:  11/17/2016 1:56 PM  [name]  has presented today for surgery, with the diagnosis of cecal mass  The various methods of treatment have been discussed with the patient and family. After consideration of risks, benefits and other options for treatment, the patient has consented to  Procedure(s): COLONOSCOPY (N/A) as a surgical intervention .  The patient's history has been reviewed, patient examined, no change in status, stable for surgery.  I have reviewed the patient's chart and labs.  Questions were answered to the patient's satisfaction.     Pricilla Riffle. Fuller Plan

## 2016-11-17 NOTE — Progress Notes (Signed)
    Progress Note   Subjective  Chief Complaint: Constipation/fecal impaction  Today, patient is again found laying in bed. He tells me that when he tries to strain he still feels as though there is something in the way of his rectum when pushing, "like an obstruction". He tells me the prep went pretty well overnight, but "not as good as I wanted". He tells me that he had to push to have a bowel movement and did not have totally liquid stools. He again tells me he is worried about being discharged from the hospital later today. He is thirsty as he has not had anything to drink since midnight.  Per nursing, patient did have clear/yellow discharge from his rectum this morning at time of cleaning, no further brown stool    Objective   Vital signs in last 24 hours: Temp:  [97.7 F (36.5 C)-99 F (37.2 C)] 98.3 F (36.8 C) (12/06 0644) Pulse Rate:  [66-127] 68 (12/06 0644) Resp:  [18] 18 (12/06 0644) BP: (90-113)/(59-77) 113/67 (12/06 0644) SpO2:  [96 %-100 %] 96 % (12/06 0644) Last BM Date: 11/17/16 General: Obese male in NAD Heart:  Regular rate and rhythm; no murmurs Lungs: Respirations even and unlabored, lungs CTA bilaterally Abdomen:  Soft, nontender and nondistended. Normal bowel sounds. Extremities:  Without edema. Neurologic:  Alert and oriented,  grossly normal neurologically. Psych:  Cooperative. Normal mood and affect.  Intake/Output from previous day: 12/05 0701 - 12/06 0700 In: 2483.4 [P.O.:720; I.V.:1213.4] Out: 650 [Urine:650]  Lab Results:  Recent Labs  11/16/16 0825  WBC 4.7  HGB 9.8*  HCT 28.8*  PLT 156   BMET  Recent Labs  11/16/16 0825  NA 138  K 3.8  CL 104  CO2 28  GLUCOSE 97  BUN 5*  CREATININE 0.53*  CALCIUM 7.8*   LFT No results for input(s): PROT, ALBUMIN, AST, ALT, ALKPHOS, BILITOT, BILIDIR, IBILI in the last 72 hours. PT/INR No results for input(s): LABPROT, INR in the last 72 hours.  Studies/Results: No results found.     Assessment / Plan:   Assessment: 1. Severe constipation/fecal impaction: Overall he has had a SMOG enema, moviprep, gastrograffin enema and completed Golytely on 11/13/16 as well as a movi prep on 11/15/16 and again on 11/16/16 .Patient scheduled for colo today 2. Abnormal CT of the abdomen: cecal mass  Plan: 1. Colonoscopy scheduled for this afternoon with Dr. Fuller Plan. 2. Patient to remain nothing by mouth until after time procedure 3. Please await any further recommendations from Dr. Fuller Plan after time procedure today.    LOS: 9 days   Levin Erp  11/17/2016, 10:41 AM  Pager # [phone]    Attending physician's note   I have taken an interval history, reviewed the chart and examined the patient. I agree with the Advanced Practitioner's note, impression and recommendations.   Lucio Edward, MD [facility] [phone] Mon-Fri 8a-5p [phone] after 5p, weekends, holidays

## 2016-11-17 NOTE — Progress Notes (Signed)
OT Cancellation Note  Patient Details Name: [name] MRN: [id number] DOB: 03-10-1951   Cancelled Treatment:    Reason Eval/Treat Not Completed: Patient at procedure or test/ unavailable  [name] 11/17/2016, 1:53 PM  Lesle Chris, OTR/L [id number] 11/17/2016

## 2016-11-17 NOTE — Progress Notes (Signed)
PT Cancellation Note  Patient Details Name: [name] MRN: [id number] DOB: 02/13/1951   Cancelled Treatment:    Reason Eval/Treat Not Completed: Patient at procedure or test/unavailable   Chinita Schimpf,KATHrine E 11/17/2016, 1:34 PM Carmelia Bake, PT, DPT 11/17/2016 Pager: [phone]

## 2016-11-18 ENCOUNTER — Encounter (HOSPITAL_COMMUNITY): Payer: Self-pay | Admitting: Gastroenterology

## 2016-11-18 DIAGNOSIS — R1084 Generalized abdominal pain: Secondary | ICD-10-CM | POA: Diagnosis not present

## 2016-11-18 DIAGNOSIS — E119 Type 2 diabetes mellitus without complications: Secondary | ICD-10-CM | POA: Diagnosis not present

## 2016-11-18 DIAGNOSIS — I4891 Unspecified atrial fibrillation: Secondary | ICD-10-CM

## 2016-11-18 DIAGNOSIS — I10 Essential (primary) hypertension: Secondary | ICD-10-CM | POA: Diagnosis not present

## 2016-11-18 DIAGNOSIS — K5901 Slow transit constipation: Secondary | ICD-10-CM | POA: Diagnosis not present

## 2016-11-18 DIAGNOSIS — E1169 Type 2 diabetes mellitus with other specified complication: Secondary | ICD-10-CM | POA: Diagnosis not present

## 2016-11-18 DIAGNOSIS — N179 Acute kidney failure, unspecified: Secondary | ICD-10-CM | POA: Diagnosis not present

## 2016-11-18 DIAGNOSIS — D120 Benign neoplasm of cecum: Secondary | ICD-10-CM | POA: Diagnosis not present

## 2016-11-18 DIAGNOSIS — D490 Neoplasm of unspecified behavior of digestive system: Secondary | ICD-10-CM | POA: Diagnosis not present

## 2016-11-18 DIAGNOSIS — R933 Abnormal findings on diagnostic imaging of other parts of digestive tract: Secondary | ICD-10-CM | POA: Diagnosis not present

## 2016-11-18 DIAGNOSIS — N183 Chronic kidney disease, stage 3 (moderate): Secondary | ICD-10-CM | POA: Diagnosis not present

## 2016-11-18 DIAGNOSIS — K5900 Constipation, unspecified: Secondary | ICD-10-CM | POA: Diagnosis not present

## 2016-11-18 DIAGNOSIS — I482 Chronic atrial fibrillation: Secondary | ICD-10-CM | POA: Diagnosis not present

## 2016-11-18 LAB — BASIC METABOLIC PANEL
ANION GAP: 6 (ref 5–15)
BUN: 6 mg/dL (ref 6–20)
CHLORIDE: 105 mmol/L (ref 101–111)
CO2: 25 mmol/L (ref 22–32)
CREATININE: 0.58 mg/dL — AB (ref 0.61–1.24)
Calcium: 7.5 mg/dL — ABNORMAL LOW (ref 8.9–10.3)
GFR calc non Af Amer: >60 mL/min (ref >60)
Glucose, Bld: 135 mg/dL — ABNORMAL HIGH (ref 65–99)
Potassium: 3.6 mmol/L (ref 3.5–5.1)
SODIUM: 136 mmol/L (ref 135–145)

## 2016-11-18 LAB — CBC
HEMATOCRIT: 26.9 % — AB (ref 39.0–52.0)
HEMOGLOBIN: 8.9 g/dL — AB (ref 13.0–17.0)
MCH: 33.6 pg (ref 26.0–34.0)
MCHC: 33.1 g/dL (ref 30.0–36.0)
MCV: 101.5 fL — ABNORMAL HIGH (ref 78.0–100.0)
Platelets: 177 10*3/uL (ref 150–400)
RBC: 2.65 MIL/uL — AB (ref 4.22–5.81)
RDW: 16.2 % — ABNORMAL HIGH (ref 11.5–15.5)
WBC: 5 10*3/uL (ref 4.0–10.5)

## 2016-11-18 LAB — GLUCOSE, CAPILLARY
GLUCOSE-CAPILLARY: 109 mg/dL — AB (ref 65–99)
GLUCOSE-CAPILLARY: 121 mg/dL — AB (ref 65–99)
GLUCOSE-CAPILLARY: 125 mg/dL — AB (ref 65–99)
GLUCOSE-CAPILLARY: 146 mg/dL — AB (ref 65–99)
Glucose-Capillary: 128 mg/dL — ABNORMAL HIGH (ref 65–99)

## 2016-11-18 MED ORDER — CHLORHEXIDINE GLUCONATE CLOTH 2 % EX PADS
6.0000 | MEDICATED_PAD | Freq: Once | CUTANEOUS | Status: AC
  Administered 2016-11-18: 6 via TOPICAL

## 2016-11-18 MED ORDER — DEXTROSE 5 % IV SOLN
2.0000 g | INTRAVENOUS | Status: AC
  Filled 2016-11-18: qty 2

## 2016-11-18 NOTE — Progress Notes (Signed)
Patient Demographics:    [name], is a 65 y.o. male, DOB - 10-18-1951, [id number]  Admit date - 11/08/2016   Admitting Physician Karmen Bongo, MD  Outpatient Primary MD for the patient is Sandi Mariscal, MD  LOS - 10   Chief Complaint  Patient presents with  . Constipation  . Weakness        Subjective:    [name]  Has no complaints today. Denies any chest pain sob.  Looking forward to have the procedure tomorrow.       Principal Problem:   Constipation Active Problems:   Essential hypertension, benign   Atrial fibrillation (HCC)   Diabetes mellitus type 2 in obese (HCC)   Hyponatremia   Urinary tract infection without hematuria   Hypokalemia   CKD (chronic kidney disease) stage 3, GFR 30-59 ml/min   Hyperbilirubinemia   Homelessness   Protein-calorie malnutrition, severe   Impaction of colon (HCC)   Abdominal pain   Generalized weakness   Fecal impaction (HCC)   AKI (acute kidney injury) (HCC)   Abnormal CT scan, colon   Benign neoplasm of cecum   Polyp of cecum  Brief Narrative:  65 y.o.malewith medical history significant ofA. Fib not on anticoagulation, DM Type 2, HTN, HLD, depression who presents initially with complaints of uncontrolled constipation. Patient admitted to being essentially noncompliant with any of his regimen for the past several years. In the emergency department, patient was noted to have symptomatically hyponatremia and tachycardia. Patient was also mildly hypotensive, receiving IV fluid hydration. Patient also found to have significant stool burden on CT abdomen pelvis. Fecal dis impation attempt was without success. Smog enema yielded limited success. The patient was ultimately admitted for further management of multiple medical issues. Underwent colonoscopy on 12/3 shows a polyp int he cecum, internal hemorrhoids, , ulcers in the rectosigmoid colon,  sonsistent with stercoral ulcers. Moderate diverticulosis in the sigmoid colon.    1)Possible Cecal Mass/Fecal Impaction/Constipation- for Colonoscopy on 11/17/2016, constipation/Fecal impaction appears to have Resolved.   2) A. fib with RVR- CHADVASC2 score is 2, refuses anticoagulation, history of noncompliance, continue metoprolol and digoxin as per cardiology,   Echocardiogram was not optimal as cardiac structures were not visualized at all. Recommended TEE to assess the cardiac struction if clinically indicated.   3)H/o Glucose intolerance-most recent A1c is 5.2, avoid overaggressive control in order to avoid life-threatening hypoglycemia  4) BPH with urinary retention- currently has Foley catheter,  (patient had in and out cath couple of times prior), continue Flomax, attempts voiding trial .   5)AKI- resolved, creatinine is back to 0.5, most likely was due to BPH with urinary retention and dehydration in the setting of possible UTI. Urine culture was negative, patient completed antibiotics.   6)Severe Protein Caloric malnutrition- continue  Nutritional supplements  6) Normocytic anemia:  Stable around 9. Monitor.   7)Social/Dispo- patient with deconditioning and debility, patient is essentially homeless, Education officer, museum consult for disposition   Consults  :  Gi, surgery.    DVT Prophylaxis  :   SCDs /Lovenox  Lab Results  Component Value Date   PLT 177 11/18/2016    Inpatient Medications  Scheduled Meds: . busPIRone  5 mg Oral TID  . digoxin  0.125 mg Oral Daily  . feeding supplement  1 Container Oral QID  . hydrocortisone   Rectal BID  . insulin aspart  0-15 Units Subcutaneous TID WC  . metoprolol tartrate  50 mg Oral BID  . multivitamin with minerals  1 tablet Oral Daily  . polyethylene glycol  17 g Oral TID  . sodium chloride flush  3 mL Intravenous Q12H  . tamsulosin  0.4 mg Oral Daily   Continuous Infusions: . 0.9 % NaCl with KCl 20 mEq / L 20 mL/hr at 11/17/16  2335   PRN Meds:.acetaminophen **OR** acetaminophen, bisacodyl, lidocaine, liver oil-zinc oxide, LORazepam, ondansetron **OR** ondansetron (ZOFRAN) IV, traMADol    Anti-infectives    Start     Dose/Rate Route Frequency Ordered Stop   11/10/16 2000  cefTRIAXone (ROCEPHIN) 1 g in dextrose 5 % 50 mL IVPB     1 g 100 mL/hr over 30 Minutes Intravenous Every 24 hours 11/10/16 1847 11/14/16 2236   11/09/16 2000  cefTRIAXone (ROCEPHIN) 2 g in dextrose 5 % 50 mL IVPB  Status:  Discontinued     2 g 100 mL/hr over 30 Minutes Intravenous Every 24 hours 11/09/16 0312 11/10/16 1847   11/08/16 2045  cefTRIAXone (ROCEPHIN) 2 g in dextrose 5 % 50 mL IVPB     2 g 100 mL/hr over 30 Minutes Intravenous  Once 11/08/16 2042 11/08/16 2229        Objective:   Vitals:   11/17/16 1510 11/17/16 2030 11/18/16 0633 11/18/16 1237  BP: 112/81 110/70 109/63 101/69  Pulse: (!) 47 (!) 102 87 66  Resp: 16 18 16 18   Temp:  98.4 F (36.9 C) 97.8 F (36.6 C) 98.3 F (36.8 C)  TempSrc:  Oral Oral Oral  SpO2: 99% 100% 98% 100%  Weight:      Height:        Wt Readings from Last 3 Encounters:  11/17/16 93 kg (205 lb)  08/06/14 (!) 157.3 kg (346 lb 12.8 oz)  08/02/14 (!) 157.6 kg (347 lb 8 oz)     Intake/Output Summary (Last 24 hours) at 11/18/16 1427 Last data filed at 11/18/16 1237  Gross per 24 hour  Intake          1668.33 ml  Output              550 ml  Net          1118.33 ml     Physical Exam  Gen:- Awake Alert,  In no apparent distress  HEENT:- [facility].AT, No sclera icterus Neck-Supple Neck,No JVD,.  Lungs-  CTAB ,  CV- S1, S2 normal, irregular Abd-  +ve B.Sounds, Abd Soft, Mild lower abdominal tenderness,   Extremity/Skin:- No  edema,       Data Review:   Micro Results Recent Results (from the past 240 hour(s))  Urine culture     Status: Abnormal   Collection Time: 11/08/16  7:47 PM  Result Value Ref Range Status   Specimen Description URINE, CLEAN CATCH  Final   Special Requests  NONE  Final   Culture MULTIPLE SPECIES PRESENT, SUGGEST RECOLLECTION (A)  Final   Report Status 11/10/2016 FINAL  Final    Radiology Reports Dg Abdomen 1 View  Result Date: 11/08/2016 CLINICAL DATA:  Constipation over the last month. EXAM: ABDOMEN - 1 VIEW COMPARISON:  None. FINDINGS: Gas pattern does not suggest ileus or obstruction. Amount of fecal matter in the colon is within normal limits with the exception of  slightly prominent stool in the rectal vault. No significant soft tissue calcifications. Some vascular calcification noted. Chronic degenerative changes affect the spine. IMPRESSION: No markedly abnormal findings. Overall amount of fecal matter appears within normal limits. Slightly prominent stool in the rectal vault. Electronically Signed   By: Nelson Chimes M.D.   On: 11/08/2016 14:29   Ct Head Wo Contrast  Result Date: 11/08/2016 CLINICAL DATA:  Generalized weakness. Poor historian. Difficulty raising right arm. EXAM: CT HEAD WITHOUT CONTRAST TECHNIQUE: Contiguous axial images were obtained from the base of the skull through the vertex without intravenous contrast. COMPARISON:  None. FINDINGS: Brain: Ventricles and cisterns are within normal. There is minimal prominence of the CSF spaces compatible with age related atrophy. There is no mass, mass effect, shift of midline structures or acute hemorrhage. No evidence of acute infarction. Vascular: Minimal calcified plaque over the cavernous segment of the internal carotid arteries. Skull: Within normal. Sinuses/Orbits: Within normal. IMPRESSION: No acute intracranial findings. Electronically Signed   By: Marin Olp M.D.   On: 11/08/2016 15:56   Ct Abdomen Pelvis W Contrast  Addendum Date: 11/15/2016   ADDENDUM REPORT: 11/15/2016 08:56 ADDENDUM: Case reviewed prior to barium enema. There is sessile and lobulated mural filling defect in the cecum on the right lateral wall measuring 3 cm in diameter by 12 mm in thickness. While adherent  stool can give this appearance, finding is more concerning for a mass. Case discussed with Dr. Fuller Plan via telephone at time of addendum. Patient is under consideration for colonoscopy. Electronically Signed   By: Monte Fantasia M.D.   On: 11/15/2016 08:56   Result Date: 11/15/2016 CLINICAL DATA:  Initial evaluation for weakness, weight loss, constipation. EXAM: CT ABDOMEN AND PELVIS WITH CONTRAST TECHNIQUE: Multidetector CT imaging of the abdomen and pelvis was performed using the standard protocol following bolus administration of intravenous contrast. CONTRAST:  163mL ISOVUE-300 IOPAMIDOL (ISOVUE-300) INJECTION 61% COMPARISON:  Prior radiograph from earlier the same day. FINDINGS: Lower chest: Mild scattered atelectatic changes present within the visualized lung bases. Visualized lungs are otherwise clear. No pleural or pericardial fusion. Hepatobiliary: Liver demonstrates a normal contrast enhanced appearance. Gallbladder within normal limits. No biliary dilatation. Pancreas: Pancreas within normal limits. Spleen: Scattered hypodense lesions noted within the spleen, largest of which measures 9 mm. These are indeterminate, but are of doubtful clinical significance. Spleen otherwise unremarkable. Adrenals/Urinary Tract: 2.3 cm hypodense nodule noted within the right adrenal gland, most likely a small adenoma. Left adrenal gland within normal limits. Kidneys equal in size with symmetric enhancement. Scattered parapelvic cyst noted within the left kidney. Few scattered calcifications within the left renal hilum suspicious for small nonobstructive calculi. These measure up to 3 mm. No hydronephrosis. No focal enhancing renal mass. Scattered cortical thinning/scarring noted. Ureters are of normal caliber without acute abnormality. Bladder within normal limits. Stomach/Bowel: Small hiatal hernia noted. Stomach otherwise unremarkable. Small bowel within normal limits for caliber without evidence for obstruction.  Colonic diverticulosis without evidence for acute diverticulitis. Appendix not visualize, compatible with history of prior appendectomy. No abnormal wall thickening, mucosal enhancement, or inflammatory fat stranding seen about the bowels. Large amount of retained stool within the rectal vault, suggesting constipation. Vascular/Lymphatic: Normal intravascular enhancement seen throughout the intra-abdominal aorta and its branch vessels. Moderate aorto bi-iliac atherosclerotic disease. No aneurysm. No pathologically enlarged intra-abdominal or pelvic lymph nodes. Reproductive: Prostate within normal limits. Other: No free air or fluid. Small bilateral fat containing inguinal hernias noted. Musculoskeletal: No acute osseous abnormality. No worrisome  lytic or blastic osseous lesions. Moderate multilevel degenerative spondylolysis noted within the visualized spine. IMPRESSION: 1. No CT evidence for acute intra-abdominal or pelvic process. 2. Large amount of retained stool within the rectal vault, suggesting constipation. 3. Sigmoid diverticulosis without evidence for acute diverticulitis. 4. Question punctate nonobstructive left renal nephrolithiasis. Electronically Signed: By: Jeannine Boga M.D. On: 11/08/2016 18:08   Dg Abd 2 Views  Result Date: 11/10/2016 CLINICAL DATA:  Acute generalized abdominal pain. EXAM: ABDOMEN - 2 VIEW COMPARISON:  CT scan of November 08, 2016. FINDINGS: The bowel gas pattern is normal. There is no evidence of free air. No radio-opaque calculi or other significant radiographic abnormality is seen. Stool is noted in the rectum. IMPRESSION: No definite evidence of bowel obstruction or ileus. Electronically Signed   By: Marijo Conception, M.D.   On: 11/10/2016 15:53   Dg Colon Therapeutic W/cm  Result Date: 11/12/2016 CLINICAL DATA:  Severe constipation. EXAM: WATER SOLUBLE CONTRAST ENEMA TECHNIQUE: Approximately 3 L of warm water mixed with 480 cc of Gastrografin was instilled per  rectum. FLUOROSCOPY TIME:  Radiation Exposure Index (if provided by the fluoroscopic device): 183.47 mGy COMPARISON:  Radiographs dated 11/10/2016 FINDINGS: EKG knee demonstrates moderate stool in the ascending colon as well as in the rectum. The study shows a 6 mm stool ball in the rectum. There is also stool throughout the remainder of the colon particularly in the cecum. There are few diverticula in the sigmoid portion the colon. The study was suboptimal due to patient's inability to move well during the exam. Multiple smaller stool balls throughout the colon. I was able to get some contrast into the ascending colon. There are no discrete strictures or definitive mass lesions. The patient was unable to sit up to evacuate the contrast at the end of the exam. IMPRESSION: Extensive stool in the distal colon as well as in the ascending colon. No dilatation of the colon. Scattered diverticula in the sigmoid portion of the colon. No discrete obstruction or mass lesion. However, the study is suboptimal for detail because of the patient's inability to retain the contrast or move well during the exam. Electronically Signed   By: Lorriane Shire M.D.   On: 11/12/2016 12:46     CBC  Recent Labs Lab 11/12/16 0430 11/14/16 0505 11/16/16 0825 11/18/16 0352  WBC 4.1 4.0 4.7 5.0  HGB 10.1* 9.2* 9.8* 8.9*  HCT 28.7* 26.9* 28.8* 26.9*  PLT 114* 131* 156 177  MCV 95.7 97.8 99.0 101.5*  MCH 33.7 33.5 33.7 33.6  MCHC 35.2 34.2 34.0 33.1  RDW 14.5 14.9 15.4 16.2*    Chemistries   Recent Labs Lab 11/12/16 0430 11/14/16 0505 11/16/16 0825 11/18/16 0352  NA 131* 133* 138 136  K 4.5 4.6 3.8 3.6  CL 100* 101 104 105  CO2 23 25 28 25   GLUCOSE 106* 109* 97 135*  BUN 11 9 5* 6  CREATININE 0.61 0.55* 0.53* 0.58*  CALCIUM 7.3* 7.4* 7.8* 7.5*  AST 11*  --   --   --   ALT 7*  --   --   --   ALKPHOS 48  --   --   --   BILITOT 0.6  --   --   --     ------------------------------------------------------------------------------------------------------------------ No results for input(s): CHOL, HDL, LDLCALC, TRIG, CHOLHDL, LDLDIRECT in the last 72 hours.  Lab Results  Component Value Date   HGBA1C 5.2 11/09/2016   ------------------------------------------------------------------------------------------------------------------ No results for input(s): TSH, T4TOTAL,  T3FREE, THYROIDAB in the last 72 hours.  Invalid input(s): FREET3 ------------------------------------------------------------------------------------------------------------------ No results for input(s): VITAMINB12, FOLATE, FERRITIN, TIBC, IRON, RETICCTPCT in the last 72 hours.  Coagulation profile No results for input(s): INR, PROTIME in the last 168 hours.  No results for input(s): DDIMER in the last 72 hours.  Cardiac Enzymes No results for input(s): CKMB, TROPONINI, MYOGLOBIN in the last 168 hours.  Invalid input(s): CK ------------------------------------------------------------------------------------------------------------------    Component Value Date/Time   BNP 94.3 08/15/2013 1834     Tashonda Pinkus M.D on 11/18/2016 at 2:27 PM  Between 7am to 7pm - Pager [phone]  After 7pm go to [email or web address] - password [facility]  [facility] -  Office  [phone]

## 2016-11-18 NOTE — Progress Notes (Signed)
OT Cancellation Note  Patient Details Name: [name] MRN: [id number] DOB: October 02, 1951   Cancelled Treatment:    Reason Eval/Treat Not Completed: Other (comment).  Pt states he just doesn't feel up to anything right now.  He asked if I could check back later, and I told him that I cannot today as I will be at a different campus.  Will try to check back tomorrow am, as schedule permits.  Plan is for surgery tomorrow.  Davene Jobin 11/18/2016, 11:42 AM  Lesle Chris, OTR/L [phone] 11/18/2016

## 2016-11-18 NOTE — Progress Notes (Signed)
Patient ID: [name], male   DOB: 11-Jun-1951, 65 y.o.   MRN: [id number]  [facility] Surgery Progress Note  1 Day Post-Op  Subjective: No complaints today. Denies any abdominal pain. Had full liquid diet last night, and is on clears today. Denies n/v.  Anxious about surgery.  Objective: Vital signs in last 24 hours: Temp:  [97.7 F (36.5 C)-98.4 F (36.9 C)] 97.8 F (36.6 C) (12/07 [id number]) Pulse Rate:  [34-102] 87 (12/07 0633) Resp:  [10-22] 16 (12/07 [id number]) BP: (89-131)/(53-81) 109/63 (12/07 0633) SpO2:  [97 %-100 %] 98 % (12/07 [id number]) Weight:  [205 lb (93 kg)] 205 lb (93 kg) (12/06 1344) Last BM Date: 11/17/16  Intake/Output from previous day: 12/06 0701 - 12/07 0700 In: 1943.3 [P.O.:240; I.V.:1128.3] Out: 825 [Urine:825] Intake/Output this shift: No intake/output data recorded.  PE: Gen:  Alert, NAD, pleasant Card:  irregular Pulm:  CTAB, no W/R/R Abd: soft, NT/ND, +BS, no masses, hernias, or organomegaly  Lab Results:   Recent Labs  11/16/16 0825 11/18/16 0352  WBC 4.7 5.0  HGB 9.8* 8.9*  HCT 28.8* 26.9*  PLT 156 177   BMET  Recent Labs  11/16/16 0825 11/18/16 0352  NA 138 136  K 3.8 3.6  CL 104 105  CO2 28 25  GLUCOSE 97 135*  BUN 5* 6  CREATININE 0.53* 0.58*  CALCIUM 7.8* 7.5*   PT/INR No results for input(s): LABPROT, INR in the last 72 hours. CMP     Component Value Date/Time   NA 136 11/18/2016 0352   K 3.6 11/18/2016 0352   CL 105 11/18/2016 0352   CO2 25 11/18/2016 0352   GLUCOSE 135 (H) 11/18/2016 0352   BUN 6 11/18/2016 0352   CREATININE 0.58 (L) 11/18/2016 0352   CREATININE 1.10 08/02/2014 1505   CALCIUM 7.5 (L) 11/18/2016 0352   PROT 4.7 (L) 11/12/2016 0430   ALBUMIN 2.3 (L) 11/12/2016 0430   AST 11 (L) 11/12/2016 0430   ALT 7 (L) 11/12/2016 0430   ALKPHOS 48 11/12/2016 0430   BILITOT 0.6 11/12/2016 0430   GFRNONAA >60 11/18/2016 0352   GFRNONAA 71 08/02/2014 1505   GFRAA >60 11/18/2016 0352   GFRAA 82 08/02/2014  1505   Lipase     Component Value Date/Time   LIPASE 25 11/08/2016 1426       Studies/Results: No results found.  Anti-infectives: Anti-infectives    Start     Dose/Rate Route Frequency Ordered Stop   11/10/16 2000  cefTRIAXone (ROCEPHIN) 1 g in dextrose 5 % 50 mL IVPB     1 g 100 mL/hr over 30 Minutes Intravenous Every 24 hours 11/10/16 1847 11/14/16 2236   11/09/16 2000  cefTRIAXone (ROCEPHIN) 2 g in dextrose 5 % 50 mL IVPB  Status:  Discontinued     2 g 100 mL/hr over 30 Minutes Intravenous Every 24 hours 11/09/16 0312 11/10/16 1847   11/08/16 2045  cefTRIAXone (ROCEPHIN) 2 g in dextrose 5 % 50 mL IVPB     2 g 100 mL/hr over 30 Minutes Intravenous  Once 11/08/16 2042 11/08/16 2229       Assessment/Plan Colonic mass - patient [facility]/27 with severe constipation. He had had intermittent constipation since April/May 2017 - CT scan demonstrated concern for a potential colonic mass - Colonoscopy performed 11/17/16 and showed one benign appearing 45x35 mm polyp in the cecum that was biopsied, stercoral ulcers that were biopsied, and moderate diverticulosis in the sigmoid colon and in the descending  colon - biopsies pending - prior to this patient had never had a colonoscopy before  HTN Atrial fibrillation - rate controlled on metoprolol and digoxin as per cardiology, ?ECHO H/o Glucose intolerance Acute on chronic renal failure Physical deconditioning  Severe protein calorie malnutrition Homeless  VTE - Lovenox, SCDs FEN - clear liquids, NPO after midnight  Plan - will discuss OR timing with MD, but will likely proceed with segmental colon resection tomorrow. Clear liquid diet today and NPO after midnight. Hold lovenox after midnight.   LOS: 10 days    Jerrye Beavers , [facility] 11/18/2016, 9:56 AM Pager: [phone] Consults: [phone] Mon-Fri 7:00 am-4:30 pm Sat-Sun 7:00 am-11:30 am

## 2016-11-18 NOTE — Progress Notes (Signed)
Attempted to obtain patient's procedure consent for surgery and patient had numerous questions that RN attempted to answer but patient stated he wanted to wait and speak with the doctor tomorrow before signing consent.  Will pass along to next shift.

## 2016-11-18 NOTE — Progress Notes (Signed)
PT Cancellation Note  Patient Details Name: [name] MRN: [id number] DOB: [birth year]   Cancelled Treatment:    Reason Eval/Treat Not Completed: Other (comment) Pt declined to mobilize.  Reminded pt of importance and benefits ambulating, and pt agrees however not willing to participate at this time.  Pt politely declines and requests PT check back tomorrow before his surgery in the afternoon.   Avrum Kimball,KATHrine E 11/18/2016, 3:49 PM Carmelia Bake, PT, DPT 11/18/2016 Pager: [phone]

## 2016-11-18 NOTE — Progress Notes (Signed)
Nutrition Follow-up  DOCUMENTATION CODES:   Severe malnutrition in context of chronic illness  INTERVENTION:  Continue Boost Breeze po QID, each supplement provides 250 kcal and 9 grams of protein.  Will monitor for diet advancement after surgery and order Ensure Enlive when appropriate.  NUTRITION DIAGNOSIS:   Inadequate oral intake related to poor appetite, other (see comment) (constipation) as evidenced by per patient/family report, percent weight loss.  Ongoing.  GOAL:   Patient will meet greater than or equal to 90% of their needs  Progressing, delayed with frequent NPO and CLD.  MONITOR:   PO intake, Supplement acceptance, Labs, Weight trends, I & O's, Skin  REASON FOR ASSESSMENT:   Consult Assessment of nutrition requirement/status  ASSESSMENT:   65 y.o.malewith medical history significant ofA. Fib not on anticoagulation, DM Type 2, HTN, HLD, depression who presents initially with complaints of uncontrolled constipation. Patient admitted to being essentially noncompliant with any of his regimen for the past several years.  -CT Abdomen/Pelvis on 11/27 showing suspected cecal mass. -Patient s/p colonoscopy on 12/6 showing on benign appearing 45x35 mm polyp in cecum, stercoral ulcers, and moderate diverticulosis in the sigmoid colon and descending colon. Biopsies pending. -Plan is for possible segmental colon resection tomorrow. -Patient had FLD for dinner last night, but is CLD today and will be NPO after midnight tonight.  Spoke with patient at bedside. He reports his appetite is "okay." Denies feeling constipated anymore. Also denies nausea/vomiting or abdominal pain. Patient enjoys the [facility] but has not been able to drink 4/day in setting of frequent NPO for procedures.   Meal Completion: 100% per patient report. In the past 24 hours patient has had approximately 1850 kcal (93% minimum estimated kcal needs) and 54 grams of protein (57% minimum estimated  protein needs).   Medications reviewed and include: Novolog sliding scale TID with meals, multivitamin with minerals daily, Miralax.   Labs reviewed: CBG 99-159 past 24 hrs, Creatinine 0.58.  Discussed with RN.   Diet Order:  Diet clear liquid Room service appropriate? Yes; Fluid consistency: Thin Diet NPO time specified  Skin:  Reviewed, no issues  Last BM:  11/17/2016  Height:   Ht Readings from Last 1 Encounters:  11/17/16 6\' 3"  (1.905 m)    Weight:   Wt Readings from Last 1 Encounters:  11/17/16 205 lb (93 kg)    Ideal Body Weight:  86.36 kg  BMI:  Body mass index is 25.62 kg/m.  Estimated Nutritional Needs:   Kcal:  2000-2300 (MSJ x 1.1-1.3)  Protein:  95-115 grams (1-1.2 grams/kg)  Fluid:  >/= 2.3 L/day (25 ml/kg)  EDUCATION NEEDS:   Education needs addressed (Encouraged adequate intake of calories and protein through small, frequent meals. Also discussed liquids may be best tolerated now.)  Willey Blade, MS, RD, LDN Pager: [phone] After Hours Pager: [phone]

## 2016-11-18 NOTE — Progress Notes (Signed)
CSW following for discharge planning - patient is still considering going to SNF when medically ready. CSW has completed FL2 & will continue to follow and assist with discharge when ready.    Raynaldo Opitz, [facility] Clinical Social Worker cell #: [phone]

## 2016-11-19 ENCOUNTER — Encounter (HOSPITAL_COMMUNITY)
Admission: EM | Disposition: A | Discharge status: Skilled Nursing Facility | Payer: Self-pay | Source: Home / Self Care | Attending: Internal Medicine

## 2016-11-19 DIAGNOSIS — D490 Neoplasm of unspecified behavior of digestive system: Secondary | ICD-10-CM | POA: Diagnosis not present

## 2016-11-19 DIAGNOSIS — N183 Chronic kidney disease, stage 3 (moderate): Secondary | ICD-10-CM | POA: Diagnosis not present

## 2016-11-19 DIAGNOSIS — E119 Type 2 diabetes mellitus without complications: Secondary | ICD-10-CM | POA: Diagnosis not present

## 2016-11-19 DIAGNOSIS — K5900 Constipation, unspecified: Secondary | ICD-10-CM | POA: Diagnosis not present

## 2016-11-19 DIAGNOSIS — I482 Chronic atrial fibrillation: Secondary | ICD-10-CM | POA: Diagnosis not present

## 2016-11-19 DIAGNOSIS — N179 Acute kidney failure, unspecified: Secondary | ICD-10-CM | POA: Diagnosis not present

## 2016-11-19 DIAGNOSIS — I4891 Unspecified atrial fibrillation: Secondary | ICD-10-CM | POA: Diagnosis not present

## 2016-11-19 DIAGNOSIS — I10 Essential (primary) hypertension: Secondary | ICD-10-CM | POA: Diagnosis not present

## 2016-11-19 DIAGNOSIS — K5901 Slow transit constipation: Secondary | ICD-10-CM | POA: Diagnosis not present

## 2016-11-19 DIAGNOSIS — D120 Benign neoplasm of cecum: Secondary | ICD-10-CM | POA: Diagnosis not present

## 2016-11-19 DIAGNOSIS — E1169 Type 2 diabetes mellitus with other specified complication: Secondary | ICD-10-CM | POA: Diagnosis not present

## 2016-11-19 DIAGNOSIS — R933 Abnormal findings on diagnostic imaging of other parts of digestive tract: Secondary | ICD-10-CM | POA: Diagnosis not present

## 2016-11-19 DIAGNOSIS — R1084 Generalized abdominal pain: Secondary | ICD-10-CM | POA: Diagnosis not present

## 2016-11-19 LAB — SURGICAL PCR SCREEN
MRSA, PCR: POSITIVE — AB
STAPHYLOCOCCUS AUREUS: POSITIVE — AB

## 2016-11-19 LAB — GLUCOSE, CAPILLARY
GLUCOSE-CAPILLARY: 101 mg/dL — AB (ref 65–99)
GLUCOSE-CAPILLARY: 104 mg/dL — AB (ref 65–99)
GLUCOSE-CAPILLARY: 102 mg/dL — AB (ref 65–99)
GLUCOSE-CAPILLARY: 88 mg/dL (ref 65–99)
Glucose-Capillary: 106 mg/dL — ABNORMAL HIGH (ref 65–99)

## 2016-11-19 SURGERY — LAPAROSCOPIC PARTIAL COLECTOMY
Anesthesia: General

## 2016-11-19 MED ORDER — POLYETHYLENE GLYCOL 3350 17 G PO PACK
17.0000 g | PACK | Freq: Two times a day (BID) | ORAL | Status: AC
  Administered 2016-11-19 (×2): 17 g via ORAL
  Filled 2016-11-19 (×2): qty 1

## 2016-11-19 MED ORDER — CHLORHEXIDINE GLUCONATE CLOTH 2 % EX PADS
6.0000 | MEDICATED_PAD | Freq: Every day | CUTANEOUS | Status: DC
  Administered 2016-11-20 – 2016-11-22 (×3): 6 via TOPICAL

## 2016-11-19 MED ORDER — MUPIROCIN 2 % EX OINT
1.0000 "application " | TOPICAL_OINTMENT | Freq: Two times a day (BID) | CUTANEOUS | Status: AC
  Administered 2016-11-19 – 2016-11-23 (×9): 1 via NASAL
  Filled 2016-11-19 (×3): qty 22

## 2016-11-19 NOTE — Progress Notes (Signed)
Physical Therapy Treatment Patient Details Name: [name] MRN: [id number] DOB: 09-07-1951 Today's Date: 11/19/2016    History of Present Illness  [name] is a 65 y.o. male with medical history significant of A. Fib not on anticoagulation, DM Type 2, HTN, HLD, depression - he is not taking medication for any of these problems and has not seen a physician in 2 years.  Patient with severe constipation, malnutrition, having trouble walking, having trouble keeping energy.  Right arm partially immobile - ?pinched nerve in neck for months.  Abnormal lesion in cecum on original CT and pt with planned colon sx on 12/9    PT Comments    Pt states surgery now scheduled for time.  Pt requires increased time and encouragement to mobilize.  He reports he is too weak however moving extremities well in bed and was able to ambulate in room.  Pt encouraged to continue at least trying to mobilize during acute stay, and also praised pt on abilities today since he did ambulate to reinforce more positive thinking.  Continue to recommend SNF upon d/c.   Follow Up Recommendations  SNF;Supervision for mobility/OOB     Equipment Recommendations  None recommended by PT    Recommendations for Other Services       Precautions / Restrictions Precautions Precautions: Fall    Mobility  Bed Mobility Overal bed mobility: Needs Assistance Bed Mobility: Rolling;Sidelying to Sit;Sit to Supine Rolling: Supervision Sidelying to sit: Supervision   Sit to supine: Mod assist   General bed mobility comments: discussed log roll technique and pt able to perform better this visit to come to sitting however performed and struggled with sit to supine despite sidelying cues   Transfers Overall transfer level: Needs assistance Equipment used: Rolling walker (2 wheeled) Transfers: Sit to/from Stand Sit to Stand: Min assist;From elevated surface         General transfer comment: verbal cues for safe technique,  assist to rise and control descent  Ambulation/Gait Ambulation/Gait assistance: Min guard Ambulation Distance (Feet): 12 Feet Assistive device: Rolling walker (2 wheeled) Gait Pattern/deviations: Step-through pattern;Decreased stride length;Trunk flexed     General Gait Details: verbal cues for safety, distance limited by pt reporting being "too weak" however mobilizing well; utilized RW today for more stability   Stairs            Wheelchair Mobility    Modified Rankin (Stroke Patients Only)       Balance                                    Cognition Arousal/Alertness: Awake/alert Behavior During Therapy: Anxious Overall Cognitive Status: Within Functional Limits for tasks assessed                      Exercises Shoulder Exercises Shoulder Extension: Left;10 reps;Supine;Theraband (blue level 4, 2 sets) Elbow Extension: Strengthening;Right;10 reps;Theraband (level 4 blue--plan to do with L also, but did not get to)    General Comments        Pertinent Vitals/Pain Pain Assessment: No/denies pain Pain Score: 0-No pain    Home Living                      Prior Function            PT Goals (current goals can now be found in the care plan section) Progress towards PT  goals: Progressing toward goals    Frequency    Min 3X/week      PT Plan Current plan remains appropriate    Co-evaluation             End of Session Equipment Utilized During Treatment: Gait belt Activity Tolerance: Other (comment) (pt self limiting) Patient left: in bed;with call bell/phone within reach;with bed alarm set     Time: 1121-1155 PT Time Calculation (min) (ACUTE ONLY): 34 min  Charges:  $Gait Training: 8-22 mins                    G Codes:      [name],[name] 2016/12/17, 1:34 PM Carmelia Bake, PT, DPT December 17, 2016 Pager: [phone]

## 2016-11-19 NOTE — Progress Notes (Signed)
Patient Demographics:    [name], is a 65 y.o. male, DOB - September 01, 1951, [id number]  Admit date - 11/08/2016   Admitting Physician Karmen Bongo, MD  Outpatient Primary MD for the patient is Sandi Mariscal, MD  LOS - 11   Chief Complaint  Patient presents with  . Constipation  . Weakness        Subjective:    [name]  Is very anxious today, reports having trouble with bowel movements.  Concerned he did not do prep well.       Principal Problem:   Constipation Active Problems:   Essential hypertension, benign   Atrial fibrillation (HCC)   Diabetes mellitus type 2 in obese (HCC)   Hyponatremia   Urinary tract infection without hematuria   Hypokalemia   CKD (chronic kidney disease) stage 3, GFR 30-59 ml/min   Hyperbilirubinemia   Homelessness   Protein-calorie malnutrition, severe   Impaction of colon (HCC)   Abdominal pain   Generalized weakness   Fecal impaction (HCC)   AKI (acute kidney injury) (HCC)   Abnormal CT scan, colon   Benign neoplasm of cecum   Polyp of cecum  Brief Narrative:  65 y.o.malewith medical history significant ofA. Fib not on anticoagulation, DM Type 2, HTN, HLD, depression who presents initially with complaints of uncontrolled constipation. Patient admitted to being essentially noncompliant with any of his regimen for the past several years. In the emergency department, patient was noted to have symptomatically hyponatremia and tachycardia. Patient was also mildly hypotensive, receiving IV fluid hydration. Patient also found to have significant stool burden on CT abdomen pelvis. Fecal dis impation attempt was without success. Smog enema yielded limited success. The patient was ultimately admitted for further management of multiple medical issues. Underwent colonoscopy on 12/3 shows a polyp int he cecum, internal hemorrhoids, , ulcers in the rectosigmoid  colon, sonsistent with stercoral ulcers. Moderate diverticulosis in the sigmoid colon.    1)Possible Cecal Mass/Fecal Impaction/Constipation- for Colonoscopy on 11/17/2016, constipation/Fecal impaction appears to have Resolved. 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 Rectal ulcer shows erosive ulcer and reactive changes. For surgery 12/9 tentatively. Recommended miralax 2 to 3 times daily for constipation and repeat colonoscopy in 2 years for polyp surveillance with Dr Henrene Pastor.   2) A. fib with RVR- CHADVASC2 score is 2, refuses anticoagulation, history of noncompliance, continue metoprolol and digoxin as per cardiology,   Echocardiogram was not optimal as cardiac structures were not visualized at all. Recommended TEE to assess the cardiac struction if clinically indicated.   3)H/o Glucose intolerance-most recent A1c is 5.2, avoid overaggressive control in order to avoid life-threatening hypoglycemia  4) BPH with urinary retention- currently has Foley catheter,  (patient had in and out cath couple of times prior), continue Flomax, attempts voiding trial .   5)AKI- resolved, creatinine is back to 0.5, most likely was due to BPH with urinary retention and dehydration in the setting of possible UTI. Urine culture was negative, patient completed antibiotics.   6)Severe Protein Caloric malnutrition- continue  Nutritional supplements  6) Normocytic anemia:  Stable between 8 to 9. . Monitor.   7)Social/Dispo- patient with deconditioning and debility, patient is essentially homeless, Education officer, museum  consult for disposition   Consults  :  Gi, surgery.    DVT Prophylaxis  :   SCDs /Lovenox  Lab Results  Component Value Date   PLT 177 11/18/2016    Inpatient Medications  Scheduled Meds: . busPIRone  5 mg Oral TID  . cefoTEtan (CEFOTAN) IV  2 g Intravenous On Call to OR  . Chlorhexidine Gluconate Cloth  6 each Topical  Q0600  . digoxin  0.125 mg Oral Daily  . feeding supplement  1 Container Oral QID  . hydrocortisone   Rectal BID  . insulin aspart  0-15 Units Subcutaneous TID WC  . metoprolol tartrate  50 mg Oral BID  . multivitamin with minerals  1 tablet Oral Daily  . mupirocin ointment  1 application Nasal BID  . polyethylene glycol  17 g Oral TID  . sodium chloride flush  3 mL Intravenous Q12H  . tamsulosin  0.4 mg Oral Daily   Continuous Infusions: . 0.9 % NaCl with KCl 20 mEq / L 20 mL/hr at 11/19/16 1218   PRN Meds:.acetaminophen **OR** acetaminophen, bisacodyl, lidocaine, liver oil-zinc oxide, LORazepam, ondansetron **OR** ondansetron (ZOFRAN) IV, traMADol    Anti-infectives    Start     Dose/Rate Route Frequency Ordered Stop   11/19/16 0600  cefoTEtan (CEFOTAN) 2 g in dextrose 5 % 50 mL IVPB     2 g 100 mL/hr over 30 Minutes Intravenous On call to O.R. 11/18/16 1645 11/20/16 0559   11/10/16 2000  cefTRIAXone (ROCEPHIN) 1 g in dextrose 5 % 50 mL IVPB     1 g 100 mL/hr over 30 Minutes Intravenous Every 24 hours 11/10/16 1847 11/14/16 2236   11/09/16 2000  cefTRIAXone (ROCEPHIN) 2 g in dextrose 5 % 50 mL IVPB  Status:  Discontinued     2 g 100 mL/hr over 30 Minutes Intravenous Every 24 hours 11/09/16 0312 11/10/16 1847   11/08/16 2045  cefTRIAXone (ROCEPHIN) 2 g in dextrose 5 % 50 mL IVPB     2 g 100 mL/hr over 30 Minutes Intravenous  Once 11/08/16 2042 11/08/16 2229        Objective:   Vitals:   11/18/16 0633 11/18/16 1237 11/18/16 2028 11/19/16 0406  BP: 109/63 101/69 103/64 109/62  Pulse: 87 66 (!) 107 66  Resp: 16 18 18 15   Temp: 97.8 F (36.6 C) 98.3 F (36.8 C) 98.7 F (37.1 C) 98.8 F (37.1 C)  TempSrc: Oral Oral Oral Oral  SpO2: 98% 100% 99% 97%  Weight:      Height:        Wt Readings from Last 3 Encounters:  11/17/16 93 kg (205 lb)  08/06/14 (!) 157.3 kg (346 lb 12.8 oz)  08/02/14 (!) 157.6 kg (347 lb 8 oz)     Intake/Output Summary (Last 24 hours) at  11/19/16 1312 Last data filed at 11/19/16 0702  Gross per 24 hour  Intake             1515 ml  Output             3575 ml  Net            -2060 ml     Physical Exam  Gen:- Awake Alert, very anxious about the procedure inam.  HEENT:- [place].AT, No sclera icterus Neck-Supple Neck,No JVD,.  Lungs-  CTAB ,  CV- S1, S2 normal, irregular Abd-  +ve B.Sounds, Abd Soft, Mild lower abdominal tenderness,   Extremity/Skin:- No  edema,  Data Review:   Micro Results Recent Results (from the past 240 hour(s))  Surgical pcr screen     Status: Abnormal   Collection Time: 11/19/16  7:41 AM  Result Value Ref Range Status   MRSA, PCR POSITIVE (A) NEGATIVE Final    Comment: RESULT CALLED TO, READ BACK BY AND VERIFIED WITH: PICKETT,S. RN @1111  ON 12.8.17 BY NMCCOY    Staphylococcus aureus POSITIVE (A) NEGATIVE Final    Comment:        The Xpert SA Assay (FDA approved for NASAL specimens in patients over 82 years of age), is one component of a comprehensive surveillance program.  Test performance has been validated by [facility] for patients greater than or equal to 19 year old. It is not intended to diagnose infection nor to guide or monitor treatment.     Radiology Reports Dg Abdomen 1 View  Result Date: 11/08/2016 CLINICAL DATA:  Constipation over the last month. EXAM: ABDOMEN - 1 VIEW COMPARISON:  None. FINDINGS: Gas pattern does not suggest ileus or obstruction. Amount of fecal matter in the colon is within normal limits with the exception of slightly prominent stool in the rectal vault. No significant soft tissue calcifications. Some vascular calcification noted. Chronic degenerative changes affect the spine. IMPRESSION: No markedly abnormal findings. Overall amount of fecal matter appears within normal limits. Slightly prominent stool in the rectal vault. Electronically Signed   By: Nelson Chimes M.D.   On: 11/08/2016 14:29   Ct Head Wo Contrast  Result Date:  11/08/2016 CLINICAL DATA:  Generalized weakness. Poor historian. Difficulty raising right arm. EXAM: CT HEAD WITHOUT CONTRAST TECHNIQUE: Contiguous axial images were obtained from the base of the skull through the vertex without intravenous contrast. COMPARISON:  None. FINDINGS: Brain: Ventricles and cisterns are within normal. There is minimal prominence of the CSF spaces compatible with age related atrophy. There is no mass, mass effect, shift of midline structures or acute hemorrhage. No evidence of acute infarction. Vascular: Minimal calcified plaque over the cavernous segment of the internal carotid arteries. Skull: Within normal. Sinuses/Orbits: Within normal. IMPRESSION: No acute intracranial findings. Electronically Signed   By: Marin Olp M.D.   On: 11/08/2016 15:56   Ct Abdomen Pelvis W Contrast  Addendum Date: 11/15/2016   ADDENDUM REPORT: 11/15/2016 08:56 ADDENDUM: Case reviewed prior to barium enema. There is sessile and lobulated mural filling defect in the cecum on the right lateral wall measuring 3 cm in diameter by 12 mm in thickness. While adherent stool can give this appearance, finding is more concerning for a mass. Case discussed with Dr. Fuller Plan via telephone at time of addendum. Patient is under consideration for colonoscopy. Electronically Signed   By: Monte Fantasia M.D.   On: 11/15/2016 08:56   Result Date: 11/15/2016 CLINICAL DATA:  Initial evaluation for weakness, weight loss, constipation. EXAM: CT ABDOMEN AND PELVIS WITH CONTRAST TECHNIQUE: Multidetector CT imaging of the abdomen and pelvis was performed using the standard protocol following bolus administration of intravenous contrast. CONTRAST:  137mL ISOVUE-300 IOPAMIDOL (ISOVUE-300) INJECTION 61% COMPARISON:  Prior radiograph from earlier the same day. FINDINGS: Lower chest: Mild scattered atelectatic changes present within the visualized lung bases. Visualized lungs are otherwise clear. No pleural or pericardial fusion.  Hepatobiliary: Liver demonstrates a normal contrast enhanced appearance. Gallbladder within normal limits. No biliary dilatation. Pancreas: Pancreas within normal limits. Spleen: Scattered hypodense lesions noted within the spleen, largest of which measures 9 mm. These are indeterminate, but are of doubtful clinical significance. Spleen  otherwise unremarkable. Adrenals/Urinary Tract: 2.3 cm hypodense nodule noted within the right adrenal gland, most likely a small adenoma. Left adrenal gland within normal limits. Kidneys equal in size with symmetric enhancement. Scattered parapelvic cyst noted within the left kidney. Few scattered calcifications within the left renal hilum suspicious for small nonobstructive calculi. These measure up to 3 mm. No hydronephrosis. No focal enhancing renal mass. Scattered cortical thinning/scarring noted. Ureters are of normal caliber without acute abnormality. Bladder within normal limits. Stomach/Bowel: Small hiatal hernia noted. Stomach otherwise unremarkable. Small bowel within normal limits for caliber without evidence for obstruction. Colonic diverticulosis without evidence for acute diverticulitis. Appendix not visualize, compatible with history of prior appendectomy. No abnormal wall thickening, mucosal enhancement, or inflammatory fat stranding seen about the bowels. Large amount of retained stool within the rectal vault, suggesting constipation. Vascular/Lymphatic: Normal intravascular enhancement seen throughout the intra-abdominal aorta and its branch vessels. Moderate aorto bi-iliac atherosclerotic disease. No aneurysm. No pathologically enlarged intra-abdominal or pelvic lymph nodes. Reproductive: Prostate within normal limits. Other: No free air or fluid. Small bilateral fat containing inguinal hernias noted. Musculoskeletal: No acute osseous abnormality. No worrisome lytic or blastic osseous lesions. Moderate multilevel degenerative spondylolysis noted within the  visualized spine. IMPRESSION: 1. No CT evidence for acute intra-abdominal or pelvic process. 2. Large amount of retained stool within the rectal vault, suggesting constipation. 3. Sigmoid diverticulosis without evidence for acute diverticulitis. 4. Question punctate nonobstructive left renal nephrolithiasis. Electronically Signed: By: Jeannine Boga M.D. On: 11/08/2016 18:08   Dg Abd 2 Views  Result Date: 11/10/2016 CLINICAL DATA:  Acute generalized abdominal pain. EXAM: ABDOMEN - 2 VIEW COMPARISON:  CT scan of November 08, 2016. FINDINGS: The bowel gas pattern is normal. There is no evidence of free air. No radio-opaque calculi or other significant radiographic abnormality is seen. Stool is noted in the rectum. IMPRESSION: No definite evidence of bowel obstruction or ileus. Electronically Signed   By: Marijo Conception, M.D.   On: 11/10/2016 15:53   Dg Colon Therapeutic W/cm  Result Date: 11/12/2016 CLINICAL DATA:  Severe constipation. EXAM: WATER SOLUBLE CONTRAST ENEMA TECHNIQUE: Approximately 3 L of warm water mixed with 480 cc of Gastrografin was instilled per rectum. FLUOROSCOPY TIME:  Radiation Exposure Index (if provided by the fluoroscopic device): 183.47 mGy COMPARISON:  Radiographs dated 11/10/2016 FINDINGS: EKG knee demonstrates moderate stool in the ascending colon as well as in the rectum. The study shows a 6 mm stool ball in the rectum. There is also stool throughout the remainder of the colon particularly in the cecum. There are few diverticula in the sigmoid portion the colon. The study was suboptimal due to patient's inability to move well during the exam. Multiple smaller stool balls throughout the colon. I was able to get some contrast into the ascending colon. There are no discrete strictures or definitive mass lesions. The patient was unable to sit up to evacuate the contrast at the end of the exam. IMPRESSION: Extensive stool in the distal colon as well as in the ascending colon.  No dilatation of the colon. Scattered diverticula in the sigmoid portion of the colon. No discrete obstruction or mass lesion. However, the study is suboptimal for detail because of the patient's inability to retain the contrast or move well during the exam. Electronically Signed   By: Lorriane Shire M.D.   On: 11/12/2016 12:46     CBC  Recent Labs Lab 11/14/16 0505 11/16/16 0825 11/18/16 0352  WBC 4.0 4.7 5.0  HGB  9.2* 9.8* 8.9*  HCT 26.9* 28.8* 26.9*  PLT 131* 156 177  MCV 97.8 99.0 101.5*  MCH 33.5 33.7 33.6  MCHC 34.2 34.0 33.1  RDW 14.9 15.4 16.2*    Chemistries   Recent Labs Lab 11/14/16 0505 11/16/16 0825 11/18/16 0352  NA 133* 138 136  K 4.6 3.8 3.6  CL 101 104 105  CO2 25 28 25   GLUCOSE 109* 97 135*  BUN 9 5* 6  CREATININE 0.55* 0.53* 0.58*  CALCIUM 7.4* 7.8* 7.5*   ------------------------------------------------------------------------------------------------------------------ No results for input(s): CHOL, HDL, LDLCALC, TRIG, CHOLHDL, LDLDIRECT in the last 72 hours.  Lab Results  Component Value Date   HGBA1C 5.2 11/09/2016   ------------------------------------------------------------------------------------------------------------------ No results for input(s): TSH, T4TOTAL, T3FREE, THYROIDAB in the last 72 hours.  Invalid input(s): FREET3 ------------------------------------------------------------------------------------------------------------------ No results for input(s): VITAMINB12, FOLATE, FERRITIN, TIBC, IRON, RETICCTPCT in the last 72 hours.  Coagulation profile No results for input(s): INR, PROTIME in the last 168 hours.  No results for input(s): DDIMER in the last 72 hours.  Cardiac Enzymes No results for input(s): CKMB, TROPONINI, MYOGLOBIN in the last 168 hours.  Invalid input(s): CK ------------------------------------------------------------------------------------------------------------------    Component Value Date/Time    BNP 94.3 08/15/2013 1834     Quavis Klutz M.D on 11/19/2016 at 1:12 PM  Between 7am to 7pm - Pager - [phone]  After 7pm go to [email or web address] - password [facility]  [facility] -  Office  [phone]

## 2016-11-19 NOTE — Progress Notes (Signed)
Occupational Therapy Treatment Patient Details Name: [name] MRN: [id number] DOB: [birth year] Today's Date: 11/19/2016    History of present illness  [name] is a 65 y.o. male with medical history significant of A. Fib not on anticoagulation, DM Type 2, HTN, HLD, depression - he is not taking medication for any of these problems and has not seen a physician in 2 years.  Patient with severe constipation, malnutrition, having trouble walking, having trouble keeping energy.  Right arm partially immobile - ?pinched nerve in neck for months.  Abnormal lesion in cecum on original CT and pt with planned colon sx on 12/9   OT comments  Pt seen only at bed level today:  He was cold and didn't want to sit up.  Provided theraband, but pt will need reinforcement with HEP.  Pt gets overwhelmed and needs all distractions cleared before he can continue with activity.    Follow Up Recommendations  SNF    Equipment Recommendations       Recommendations for Other Services      Precautions / Restrictions Precautions Precautions: Fall Restrictions Weight Bearing Restrictions: No       Mobility Bed Mobility                  Transfers                      Balance                                   ADL                                         General ADL Comments: pt did not want to perform any adl activities this session: had just used bedpan and was cold.  He states that he wants to be able to use arms better to help with sit to stand.  Issued blue Level 4 theraband, and will continue to work on this with him.  Also used arms to assist with pulling himself up in bed.  of note, he has limited ROM with LUE to 90 and fingers are stiff and he cannot fully make a fist.  He is able to write/draw (but not like he used to) and he can feed himself.  Pt does perform PROM/stretch to fingers      Vision                     Perception      Praxis      Cognition   Behavior During Therapy: Anxious Overall Cognitive Status: Within Functional Limits for tasks assessed                       Extremity/Trunk Assessment               Exercises Shoulder Exercises Shoulder Extension: Left;10 reps;Supine;Theraband (blue level 4, 2 sets) Elbow Extension: Strengthening;Right;10 reps;Theraband (level 4 blue--plan to do with L also, but did not get to)   Shoulder Instructions       General Comments      Pertinent Vitals/ Pain       Pain Assessment: Faces Pain Score: 0-No pain  Home Living  Prior Functioning/Environment              Frequency           Progress Toward Goals  OT Goals(current goals can now be found in the care plan section)  Progress towards OT goals: Progressing toward goals (slowly--has only been seen once)     Plan      Co-evaluation                 End of Session     Activity Tolerance Patient tolerated treatment well   Patient Left in bed;with bed alarm set   Nurse Communication          Time: [id number] OT Time Calculation (min): 33 min  Charges: OT General Charges $OT Visit: 1 Procedure OT Treatments $Therapeutic Activity: 8-22 mins $Therapeutic Exercise: 8-22 mins  [name] 11/19/2016, 11:25 AM  Lesle Chris, OTR/L [phone] 11/19/2016

## 2016-11-19 NOTE — Discharge Instructions (Addendum)
[facility], [place] [phone]  OPEN/LAPAROSCOPIC ASSISTED ABDOMINAL SURGERY: POST OP INSTRUCTIONS  Always review your discharge instruction sheet given to you by the facility where your surgery was performed.  IF YOU HAVE DISABILITY OR FAMILY LEAVE FORMS, YOU MUST BRING THEM TO THE OFFICE FOR PROCESSING.  PLEASE DO NOT GIVE THEM TO YOUR DOCTOR.  1. A prescription for pain medication may be given to you upon discharge.  Take your pain medication as prescribed, if needed.  If narcotic pain medicine is not needed, then you may take acetaminophen (Tylenol) or ibuprofen (Advil) as needed. 2. Take your usually prescribed medications unless otherwise directed. 3. If you need a refill on your pain medication, please contact your pharmacy. They will contact our office to request authorization.  Prescriptions will not be filled after 5pm or on week-ends. 4. You should follow a light diet the first few days after arrival home, such as soup and crackers, pudding, etc.unless your doctor has advised otherwise. A high-fiber, low fat diet can be resumed as tolerated.   Be sure to include lots of fluids daily. Most patients will experience some swelling and bruising on the chest and neck area.  Ice packs will help.  Swelling and bruising can take several days to resolve 5. Most patients will experience some swelling and bruising in the area of the incision. Ice pack will help. Swelling and bruising can take several days to resolve..  6. It is common to experience some constipation if taking pain medication after surgery.  Increasing fluid intake and taking a stool softener will usually help or prevent this problem from occurring.  A mild laxative (Milk of Magnesia or Miralax) should be taken according to package directions if there are no bowel movements after 48 hours. 7.  You may have steri-strips (small skin tapes) in place directly over the incision.  These strips should be left on the skin  for 7-10 days.  If your surgeon used skin glue on the incision, you may shower in 24 hours.  The glue will flake off over the next 2-3 weeks.  Any staples will be removed at the office during your follow-up visit. You may find that a light gauze bandage over your incision may keep your staples from being rubbed or pulled. You may shower and replace the bandage daily. 8. ACTIVITIES:  You may resume regular (light) daily activities beginning the next day--such as daily self-care, walking, climbing stairs--gradually increasing activities as tolerated.  You may have sexual intercourse when it is comfortable.  Refrain from any heavy lifting or straining until approved by your doctor. Do not lift anything over 10lb. a. You may drive when you no longer are taking prescription pain medication, you can comfortably wear a seatbelt, and you can safely maneuver your car and apply brakes 9. You should see your doctor in the office for a follow-up appointment approximately two weeks after your surgery.  Make sure that you call for this appointment within a day or two after you arrive home to insure a convenient appointment time.   WHEN TO CALL YOUR DOCTOR: 1. Fever over 101.0 2. Inability to urinate 3. Nausea and/or vomiting 4. Extreme swelling or bruising 5. Continued bleeding from incision. 6. Increased pain, redness, or drainage from the incision. 7. Difficulty swallowing or breathing 8. Muscle cramping or spasms. 9. Numbness or tingling in hands or feet or around lips.  The clinic staff is available to answer your questions during  regular business hours.  Please dont hesitate to call and ask to speak to one of the nurses if you have concerns.  For further questions, please visit [email or web address]      MRSA Infection, Adult MRSA stands for methicillin-resistant Staphylococcus aureus. This type of infection is caused by Staphylococcus aureus bacteria that are no longer affected by the  medicines used to kill them (drug resistant). Staphylococcus (staph) bacteria are normally found on the skin or in the nose of healthy people. In most cases, these bacteria do not cause infection. But if these resistant bacteria enter your body through a cut or sore, they can cause a serious infection on your skin or in other parts of your body. There is a slight chance that the staph on your skin or in your nose is MRSA. There are two types of MRSA infections:  Hospital-acquired MRSA is bacteria that you get in the hospital.  Community-acquired MRSA is bacteria that you get somewhere other than in a hospital. What increases the risk? Hospital-acquired MRSA is more common. You could be at risk for this infection if you are in the hospital and you:  Have surgery or a procedure.  Have an IV access or a catheter tube placed in your body.  Have weak resistance to germs (weakened immune system).  Are elderly.  Are on kidney dialysis. You could be at risk for community-acquired MRSA if you have a break in your skin and come into contact with MRSA. This may happen if you:  Play sports where there is skin-to-skin contact.  Live in a crowded setting, like a dormitory or a [employer]  Share towels, razors, or sports equipment with other people. What are the signs or symptoms? Symptoms of hospital-acquired MRSA depend on where MRSA has spread. Symptoms may include:  Wound infection.  Skin infection.  Rash.  Pneumonia.  Fever and chills.  Difficulty breathing.  Chest pain. Community-acquired MRSA is most likely to start as a scratch or cut that becomes infected. Symptoms may include:  A pus-filled pimple.  A boil on your skin.  Pus draining from your skin.  A sore (abscess) under your skin or somewhere in your body.  Fever with or without chills. How is this diagnosed? The diagnosis of MRSA is made by taking a sample from an infected area and sending it to a lab for  testing. A lab technician can grow (culture) MRSA and check it under a microscope. The cultured MRSA can be tested to see which type of antibiotic medicine will work to treat it. Newer tests can identify MRSA more quickly by testing bacteria samples for MRSA genes. Your health care provider can diagnose MRSA using samples from:  Cuts or wounds in infected areas.  Nasal swabs.  Saliva or cough specimens from deep in the lungs (sputum).  Urine.  Blood. You may also have:  Imaging studies (such as X-ray or MRI) to check if the infection has spread to the lungs, bones, or joints.  A culture and sensitivity test of blood or fluids from inside the joints. How is this treated? Treatment depends on how severe, deep, or extensive the infection is. Very bad infections may require a hospital stay.  Some skin infections, such as a small boil or sore (abscess), may be treated by draining pus from the site of the infection.  More extensive surgery to drain pus may be necessary for deeper or more widespread soft tissue infections.  You may then have to take antibiotic  medicine given by mouth or through a vein. You may start antibiotic treatment right away or after testing can be done to see what antibiotic medicine should be used. Follow these instructions at home:  Take your antibiotics as directed by your health care provider. Take the medicine as prescribed until it is finished.  Avoid close contact with those around you as much as possible. Do not use towels, razors, toothbrushes, bedding, or other items that will be used by others.  Wash your hands frequently for 15 seconds with soap and water. Dry your hands with a clean or disposable towel.  When you are not able to wash your hands, use hand sanitizer that is more than 60 percent alcohol.  Wash towels, sheets, or clothes in the washing machine with detergent and hot water. Dry them in a hot dryer.  Follow your health care provider's  instructions for wound care. Wash your hands before and after changing your bandages.  Always shower after exercising.  Keep all cuts and scrapes clean and covered with a bandage.  Be sure to tell all your health care providers that you have MRSA so they are aware of your infection. Contact a health care provider if:  You have a cut, scrape, pimple, or boil that becomes red, swollen, or painful or has pus in it.  You have pus draining from your skin.  You have an abscess under your skin or somewhere in your body. Get help right away if:  You have symptoms of a skin infection with a fever or chills.  You have trouble breathing.  You have chest pain.  You have a skin wound and you become nauseous or start vomiting. This information is not intended to replace advice given to you by your health care provider. Make sure you discuss any questions you have with your health care provider. Document Released: 11/29/2005 Document Revised: 05/06/2016 Document Reviewed: 09/21/2013 Elsevier Interactive Patient Education  2017 [employer].

## 2016-11-19 NOTE — Care Management Important Message (Signed)
Important Message  Patient Details  Name: Nero Burke MRN: [id number] Date of Birth: Mar 31, 1951   Medicare Important Message Given:  Yes    Camillo Flaming 11/19/2016, 12:55 [place] Message  Patient Details  Name: Yassir Butkowski MRN: [id number] Date of Birth: 1950/12/22   Medicare Important Message Given:  Yes    Camillo Flaming 11/19/2016, 12:55 PM

## 2016-11-19 NOTE — Progress Notes (Signed)
Surgery on hold for today per PA-Simaan, planned for tomorrow. Clear liquid diet ordered. SRP, RN

## 2016-11-19 NOTE — Progress Notes (Signed)
[facility] Progress Note  2 Days Post-Op  Subjective: Anxious about surgery. Having multiple bowel movements. Denies nausea/vomiting. Denies abdominal pain.  Objective: Vital signs in last 24 hours: Temp:  [98.3 F (36.8 C)-98.8 F (37.1 C)] 98.8 F (37.1 C) (12/08 0406) Pulse Rate:  [66-107] 66 (12/08 0406) Resp:  [15-18] 15 (12/08 0406) BP: (101-109)/(62-69) 109/62 (12/08 0406) SpO2:  [97 %-100 %] 97 % (12/08 0406) Last BM Date: 11/18/16  Intake/Output from previous day: 12/07 0701 - 12/08 0700 In: 1995 [P.O.:1755; I.V.:240] Out: 3200 [Urine:3200] Intake/Output this shift: Total I/O In: -  Out: 600 [Urine:600]  PE: Gen:  Alert, NAD, pleasant Pulm:  CTABL Abd: Soft, NT/ND, +BS  Lab Results:   Recent Labs  11/18/16 0352  WBC 5.0  HGB 8.9*  HCT 26.9*  PLT 177   BMET  Recent Labs  11/18/16 0352  NA 136  K 3.6  CL 105  CO2 25  GLUCOSE 135*  BUN 6  CREATININE 0.58*  CALCIUM 7.5*   PT/INR No results for input(s): LABPROT, INR in the last 72 hours. CMP     Component Value Date/Time   NA 136 11/18/2016 0352   K 3.6 11/18/2016 0352   CL 105 11/18/2016 0352   CO2 25 11/18/2016 0352   GLUCOSE 135 (H) 11/18/2016 0352   BUN 6 11/18/2016 0352   CREATININE 0.58 (L) 11/18/2016 0352   CREATININE 1.10 08/02/2014 1505   CALCIUM 7.5 (L) 11/18/2016 0352   PROT 4.7 (L) 11/12/2016 0430   ALBUMIN 2.3 (L) 11/12/2016 0430   AST 11 (L) 11/12/2016 0430   ALT 7 (L) 11/12/2016 0430   ALKPHOS 48 11/12/2016 0430   BILITOT 0.6 11/12/2016 0430   GFRNONAA >60 11/18/2016 0352   GFRNONAA 71 08/02/2014 1505   GFRAA >60 11/18/2016 0352   GFRAA 82 08/02/2014 1505   Lipase     Component Value Date/Time   LIPASE 25 11/08/2016 1426       Studies/Results: No results found.  Anti-infectives: Anti-infectives    Start     Dose/Rate Route Frequency Ordered Stop   11/19/16 0600  cefoTEtan (CEFOTAN) 2 g in dextrose 5 % 50 mL IVPB     2 g 100 mL/hr over  30 Minutes Intravenous On call to O.R. 11/18/16 1645 11/20/16 0559   11/10/16 2000  cefTRIAXone (ROCEPHIN) 1 g in dextrose 5 % 50 mL IVPB     1 g 100 mL/hr over 30 Minutes Intravenous Every 24 hours 11/10/16 1847 11/14/16 2236   11/09/16 2000  cefTRIAXone (ROCEPHIN) 2 g in dextrose 5 % 50 mL IVPB  Status:  Discontinued     2 g 100 mL/hr over 30 Minutes Intravenous Every 24 hours 11/09/16 0312 11/10/16 1847   11/08/16 2045  cefTRIAXone (ROCEPHIN) 2 g in dextrose 5 % 50 mL IVPB     2 g 100 mL/hr over 30 Minutes Intravenous  Once 11/08/16 2042 11/08/16 2229       Assessment/Plan Colonic mass - patient [facility] 11/27 with severe constipation. 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 - Colonoscopy performed 11/17/16 and showed one benign appearing 45x35 mm polyp in the cecum that was biopsied, stercoral ulcers that were biopsied, and moderate diverticulosis in the sigmoid colon and in the descending colon - path pending - prior to this patient had never had a colonoscopy before  HTN Atrial fibrillation - rate controlled on metoprolol and digoxin as  per cardiology, ?ECHO H/o Glucose intolerance Acute on chronic renal failure Physical deconditioning  Severe protein calorie malnutrition Homeless  VTE - Lovenox, SCDs FEN - clear liquids, NPO after midnight  Plan: due to multiple urgent cases presenting overnight, patients surgery had to be postponed. laparoscopic right colectomy over the weekend, like tomorrow 11/20/16. Will discuss any additional bowel prep with MD. Follow pathology    LOS: 11 days    Jill Alexanders , [facility] 11/19/2016, 9:52 AM Pager: [phone] Consults: [phone] Mon-Fri 7:00 am-4:30 pm Sat-Sun 7:00 am-11:30 am

## 2016-11-19 NOTE — Progress Notes (Signed)
Pathology back on patient. 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 Rectal ulcer shows erosive ulcer and reactive changes. For surgery 12/9 tentatively. Miralax 2-3 times daily long term for mgmt of constipation. Colonoscopy in 2 years for polyp surveillance with Dr. Scarlette Shorts. GI signing off. Available if needed.

## 2016-11-20 DIAGNOSIS — I482 Chronic atrial fibrillation: Secondary | ICD-10-CM | POA: Diagnosis not present

## 2016-11-20 DIAGNOSIS — I10 Essential (primary) hypertension: Secondary | ICD-10-CM | POA: Diagnosis not present

## 2016-11-20 DIAGNOSIS — K5900 Constipation, unspecified: Secondary | ICD-10-CM | POA: Diagnosis not present

## 2016-11-20 DIAGNOSIS — K5901 Slow transit constipation: Secondary | ICD-10-CM | POA: Diagnosis not present

## 2016-11-20 DIAGNOSIS — N183 Chronic kidney disease, stage 3 (moderate): Secondary | ICD-10-CM | POA: Diagnosis not present

## 2016-11-20 DIAGNOSIS — E1169 Type 2 diabetes mellitus with other specified complication: Secondary | ICD-10-CM | POA: Diagnosis not present

## 2016-11-20 DIAGNOSIS — I4891 Unspecified atrial fibrillation: Secondary | ICD-10-CM | POA: Diagnosis not present

## 2016-11-20 DIAGNOSIS — N179 Acute kidney failure, unspecified: Secondary | ICD-10-CM | POA: Diagnosis not present

## 2016-11-20 DIAGNOSIS — D120 Benign neoplasm of cecum: Secondary | ICD-10-CM | POA: Diagnosis not present

## 2016-11-20 DIAGNOSIS — R933 Abnormal findings on diagnostic imaging of other parts of digestive tract: Secondary | ICD-10-CM | POA: Diagnosis not present

## 2016-11-20 DIAGNOSIS — E119 Type 2 diabetes mellitus without complications: Secondary | ICD-10-CM | POA: Diagnosis not present

## 2016-11-20 DIAGNOSIS — R1084 Generalized abdominal pain: Secondary | ICD-10-CM | POA: Diagnosis not present

## 2016-11-20 DIAGNOSIS — D490 Neoplasm of unspecified behavior of digestive system: Secondary | ICD-10-CM | POA: Diagnosis not present

## 2016-11-20 LAB — GLUCOSE, CAPILLARY
GLUCOSE-CAPILLARY: 136 mg/dL — AB (ref 65–99)
Glucose-Capillary: 101 mg/dL — ABNORMAL HIGH (ref 65–99)
Glucose-Capillary: 128 mg/dL — ABNORMAL HIGH (ref 65–99)
Glucose-Capillary: 145 mg/dL — ABNORMAL HIGH (ref 65–99)
Glucose-Capillary: 172 mg/dL — ABNORMAL HIGH (ref 65–99)
Glucose-Capillary: 92 mg/dL (ref 65–99)

## 2016-11-20 MED ORDER — ENSURE ENLIVE PO LIQD
237.0000 mL | Freq: Two times a day (BID) | ORAL | Status: DC
  Administered 2016-11-21: 237 mL via ORAL

## 2016-11-20 NOTE — Progress Notes (Addendum)
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  He is very anxious.  Active Problems:   Essential hypertension, benign   Atrial fibrillation (HCC)   Diabetes mellitus type 2 in obese (HCC)   Hyponatremia   Urinary tract infection without hematuria   Hypokalemia   CKD (chronic kidney disease) stage 3, GFR 30-59 ml/min   Hyperbilirubinemia   Homelessness   Protein-calorie malnutrition, severe   Deconditioned state         Plan:  Full liquids and Ensure supplements. PT this weekend to help with deconditioning.  Plan elective partial colectomy Monday. I have explained the procedure and risks of colon resection.  Risks include but are not limited to bleeding, infection, wound problems, anesthesia, anastomotic leak, need for colostomy, need for reoperative surgery,  injury to intraabominal organs (such as intestine, spleen, kidney, bladder, ureter, etc.), ileus, irregular bowel habits.  He seems to understand.   LOS: 12 days     3 Days Post-Op  Subjective: Very worried about weakness and some fecal incontinence.  Worried about operation as well.  Objective: Vital signs in last 24 hours: Temp:  [98.5 F (36.9 C)-98.9 F (37.2 C)] 98.5 F (36.9 C) (12/09 0455) Pulse Rate:  [69-88] 80 (12/09 0455) Resp:  [16-20] 18 (12/09 0455) BP: (115-121)/(61-71) 121/71 (12/09 0455) SpO2:  [96 %-100 %] 96 % (12/09 0455) Last BM Date: 11/19/16  Intake/Output from previous day: 12/08 0701 - 12/09 0700 In: 2484.7 [P.O.:1780; I.V.:704.7] Out: 4900 [Urine:4900] Intake/Output this shift: No intake/output data recorded.  PE: General- In NAD Abdomen-soft, obese, no masses, no hernias, not tender.  Lab Results:   Recent Labs  11/18/16 0352  WBC 5.0  HGB 8.9*  HCT 26.9*  PLT 177   BMET  Recent Labs  11/18/16 0352  NA 136  K 3.6  CL 105  CO2 25  GLUCOSE 135*  BUN 6  CREATININE  0.58*  CALCIUM 7.5*   PT/INR No results for input(s): LABPROT, INR in the last 72 hours. Comprehensive Metabolic Panel:    Component Value Date/Time   NA 136 11/18/2016 0352   NA 138 11/16/2016 0825   K 3.6 11/18/2016 0352   K 3.8 11/16/2016 0825   CL 105 11/18/2016 0352   CL 104 11/16/2016 0825   CO2 25 11/18/2016 0352   CO2 28 11/16/2016 0825   BUN 6 11/18/2016 0352   BUN 5 (L) 11/16/2016 0825   CREATININE 0.58 (L) 11/18/2016 0352   CREATININE 0.53 (L) 11/16/2016 0825   CREATININE 1.10 08/02/2014 1505   CREATININE 1.08 04/26/2014 0830   GLUCOSE 135 (H) 11/18/2016 0352   GLUCOSE 97 11/16/2016 0825   CALCIUM 7.5 (L) 11/18/2016 0352   CALCIUM 7.8 (L) 11/16/2016 0825   AST 11 (L) 11/12/2016 0430   AST 17 11/08/2016 1411   ALT 7 (L) 11/12/2016 0430   ALT 11 (L) 11/08/2016 1411   ALKPHOS 48 11/12/2016 0430   ALKPHOS 76 11/08/2016 1411   BILITOT 0.6 11/12/2016 0430   BILITOT 2.1 (H) 11/08/2016 1411   PROT 4.7 (L) 11/12/2016 0430   PROT 7.7 11/08/2016 1411   ALBUMIN 2.3 (L) 11/12/2016 0430   ALBUMIN 4.2 11/08/2016 1411     Studies/Results: No results found.  Anti-infectives: Anti-infectives    Start     Dose/Rate Route Frequency Ordered Stop   11/19/16 0600  cefoTEtan (CEFOTAN) 2 g in dextrose 5 %  50 mL IVPB     2 g 100 mL/hr over 30 Minutes Intravenous On call to O.R. 11/18/16 1645 11/20/16 0559   11/10/16 2000  cefTRIAXone (ROCEPHIN) 1 g in dextrose 5 % 50 mL IVPB     1 g 100 mL/hr over 30 Minutes Intravenous Every 24 hours 11/10/16 1847 11/14/16 2236   11/09/16 2000  cefTRIAXone (ROCEPHIN) 2 g in dextrose 5 % 50 mL IVPB  Status:  Discontinued     2 g 100 mL/hr over 30 Minutes Intravenous Every 24 hours 11/09/16 0312 11/10/16 1847   11/08/16 2045  cefTRIAXone (ROCEPHIN) 2 g in dextrose 5 % 50 mL IVPB     2 g 100 mL/hr over 30 Minutes Intravenous  Once 11/08/16 2042 11/08/16 2229       Yocheved Depner J 11/20/2016

## 2016-11-20 NOTE — Progress Notes (Signed)
Patient Demographics:    [name], is a 65 y.o. male, DOB - 1951/07/27, HR:[id number]  Admit date - 11/08/2016   Admitting Physician Karmen Bongo, MD  Outpatient Primary MD for the patient is Sandi Mariscal, MD  LOS - 12   Chief Complaint  Patient presents with  . Constipation  . Weakness        Subjective:    [name] is more calmer . No new complaints.       Principal Problem:   Constipation Active Problems:   Essential hypertension, benign   Atrial fibrillation (HCC)   Diabetes mellitus type 2 in obese (HCC)   Hyponatremia   Urinary tract infection without hematuria   Hypokalemia   CKD (chronic kidney disease) stage 3, GFR 30-59 ml/min   Hyperbilirubinemia   Homelessness   Protein-calorie malnutrition, severe   Impaction of colon (HCC)   Abdominal pain   Generalized weakness   Fecal impaction (HCC)   AKI (acute kidney injury) (HCC)   Abnormal CT scan, colon   Benign neoplasm of cecum   Polyp of cecum  Brief Narrative:  65 y.o.malewith medical history significant ofA. Fib not on anticoagulation, DM Type 2, HTN, HLD, depression who presents initially with complaints of uncontrolled constipation. Patient admitted to being essentially noncompliant with any of his regimen for the past several years. In the emergency department, patient was noted to have symptomatically hyponatremia and tachycardia. Patient was also mildly hypotensive, receiving IV fluid hydration. Patient also found to have significant stool burden on CT abdomen pelvis. Fecal dis impation attempt was without success. Smog enema yielded limited success. The patient was ultimately admitted for further management of multiple medical issues. Underwent colonoscopy on 12/3 shows a polyp int he cecum, internal hemorrhoids, , ulcers in the rectosigmoid colon, sonsistent with stercoral ulcers. Moderate diverticulosis in the  sigmoid colon.    1)Possible Cecal Mass/Fecal Impaction/Constipation- for Colonoscopy on 11/17/2016, constipation/Fecal impaction appears to have Resolved. 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 Rectal ulcer shows erosive ulcer and reactive changes. For surgery 12/11 tentatively. Recommended miralax 2 to 3 times daily for constipation and repeat colonoscopy in 2 years for polyp surveillance with Dr Henrene Pastor.   2) A. fib with RVR- CHADVASC2 score is 2, refuses anticoagulation, history of noncompliance, continue metoprolol and digoxin as per cardiology,   Echocardiogram was not optimal as cardiac structures were not visualized at all. Recommended TEE to assess the cardiac struction if clinically indicated.   3)H/o Glucose intolerance-most recent A1c is 5.2, avoid overaggressive control in order to avoid life-threatening hypoglycemia.  CBG (last 3)   Recent Labs  11/20/16 0449 11/20/16 0754 11/20/16 1229  GLUCAP 92 101* 172*      4) BPH with urinary retention- currently has Foley catheter,  (patient had in and out cath couple of times prior), continue Flomax, . No new complaints.   5)AKI- resolved, creatinine is back to 0.5, most likely was due to BPH with urinary retention and dehydration in the setting of possible UTI. Urine culture was negative, patient completed antibiotics.   6)Severe Protein Caloric malnutrition- continue  Nutritional supplements  6) Normocytic anemia:  Stable between 8 to 9. . Monitor.  7)Social/Dispo- patient with deconditioning and debility, patient is essentially homeless, Education officer, museum consult for disposition   Consults  :  Gi, surgery.    DVT Prophylaxis  :   SCDs /Lovenox  Lab Results  Component Value Date   PLT 177 11/18/2016    Inpatient Medications  Scheduled Meds: . busPIRone  5 mg Oral TID  . Chlorhexidine Gluconate Cloth  6 each Topical Q0600  . digoxin  0.125 mg  Oral Daily  . feeding supplement  1 Container Oral QID  . hydrocortisone   Rectal BID  . insulin aspart  0-15 Units Subcutaneous TID WC  . metoprolol tartrate  50 mg Oral BID  . multivitamin with minerals  1 tablet Oral Daily  . mupirocin ointment  1 application Nasal BID  . sodium chloride flush  3 mL Intravenous Q12H  . tamsulosin  0.4 mg Oral Daily   Continuous Infusions: . 0.9 % NaCl with KCl 20 mEq / L 20 mL/hr at 11/19/16 1218   PRN Meds:.acetaminophen **OR** acetaminophen, bisacodyl, lidocaine, liver oil-zinc oxide, LORazepam, ondansetron **OR** ondansetron (ZOFRAN) IV, traMADol    Anti-infectives    Start     Dose/Rate Route Frequency Ordered Stop   11/19/16 0600  cefoTEtan (CEFOTAN) 2 g in dextrose 5 % 50 mL IVPB     2 g 100 mL/hr over 30 Minutes Intravenous On call to O.R. 11/18/16 1645 11/20/16 0559   11/10/16 2000  cefTRIAXone (ROCEPHIN) 1 g in dextrose 5 % 50 mL IVPB     1 g 100 mL/hr over 30 Minutes Intravenous Every 24 hours 11/10/16 1847 11/14/16 2236   11/09/16 2000  cefTRIAXone (ROCEPHIN) 2 g in dextrose 5 % 50 mL IVPB  Status:  Discontinued     2 g 100 mL/hr over 30 Minutes Intravenous Every 24 hours 11/09/16 0312 11/10/16 1847   11/08/16 2045  cefTRIAXone (ROCEPHIN) 2 g in dextrose 5 % 50 mL IVPB     2 g 100 mL/hr over 30 Minutes Intravenous  Once 11/08/16 2042 11/08/16 2229        Objective:   Vitals:   11/19/16 1355 11/19/16 2020 11/20/16 0455 11/20/16 1400  BP: 116/61 115/62 121/71 103/60  Pulse: 88 69 80 61  Resp: 16 20 18 18   Temp: 98.9 F (37.2 C) 98.8 F (37.1 C) 98.5 F (36.9 C) 97.8 F (36.6 C)  TempSrc: Oral Oral Oral Oral  SpO2: 100% 98% 96% 99%  Weight:      Height:        Wt Readings from Last 3 Encounters:  11/17/16 93 kg (205 lb)  08/06/14 (!) 157.3 kg (346 lb 12.8 oz)  08/02/14 (!) 157.6 kg (347 lb 8 oz)     Intake/Output Summary (Last 24 hours) at 11/20/16 1703 Last data filed at 11/20/16 1330  Gross per 24 hour    Intake           854.67 ml  Output             5100 ml  Net         -4245.33 ml     Physical Exam  Gen:- Alert and comfortable.  HEENT:- [place].AT, No sclera icterus Neck-Supple Neck,No JVD,.  Lungs-  CTAB ,  CV- S1, S2 normal, irregular Abd-  +ve B.Sounds, Abd Soft, Mild lower abdominal tenderness,   Extremity/Skin:- No  edema,       Data Review:   Micro Results Recent Results (from the past 240 hour(s))  Surgical pcr  screen     Status: Abnormal   Collection Time: 11/19/16  7:41 AM  Result Value Ref Range Status   MRSA, PCR POSITIVE (A) NEGATIVE Final    Comment: RESULT CALLED TO, READ BACK BY AND VERIFIED WITH: PICKETT,S. RN @1111  ON 12.8.17 BY NMCCOY    Staphylococcus aureus POSITIVE (A) NEGATIVE Final    Comment:        The Xpert SA Assay (FDA approved for NASAL specimens in patients over 73 years of age), is one component of a comprehensive surveillance program.  Test performance has been validated by [facility] for patients greater than or equal to 40 year old. It is not intended to diagnose infection nor to guide or monitor treatment.     Radiology Reports Dg Abdomen 1 View  Result Date: 11/08/2016 CLINICAL DATA:  Constipation over the last month. EXAM: ABDOMEN - 1 VIEW COMPARISON:  None. FINDINGS: Gas pattern does not suggest ileus or obstruction. Amount of fecal matter in the colon is within normal limits with the exception of slightly prominent stool in the rectal vault. No significant soft tissue calcifications. Some vascular calcification noted. Chronic degenerative changes affect the spine. IMPRESSION: No markedly abnormal findings. Overall amount of fecal matter appears within normal limits. Slightly prominent stool in the rectal vault. Electronically Signed   By: Nelson Chimes M.D.   On: 11/08/2016 14:29   Ct Head Wo Contrast  Result Date: 11/08/2016 CLINICAL DATA:  Generalized weakness. Poor historian. Difficulty raising right arm. EXAM: CT HEAD  WITHOUT CONTRAST TECHNIQUE: Contiguous axial images were obtained from the base of the skull through the vertex without intravenous contrast. COMPARISON:  None. FINDINGS: Brain: Ventricles and cisterns are within normal. There is minimal prominence of the CSF spaces compatible with age related atrophy. There is no mass, mass effect, shift of midline structures or acute hemorrhage. No evidence of acute infarction. Vascular: Minimal calcified plaque over the cavernous segment of the internal carotid arteries. Skull: Within normal. Sinuses/Orbits: Within normal. IMPRESSION: No acute intracranial findings. Electronically Signed   By: Marin Olp M.D.   On: 11/08/2016 15:56   Ct Abdomen Pelvis W Contrast  Addendum Date: 11/15/2016   ADDENDUM REPORT: 11/15/2016 08:56 ADDENDUM: Case reviewed prior to barium enema. There is sessile and lobulated mural filling defect in the cecum on the right lateral wall measuring 3 cm in diameter by 12 mm in thickness. While adherent stool can give this appearance, finding is more concerning for a mass. Case discussed with Dr. Fuller Plan via telephone at time of addendum. Patient is under consideration for colonoscopy. Electronically Signed   By: Monte Fantasia M.D.   On: 11/15/2016 08:56   Result Date: 11/15/2016 CLINICAL DATA:  Initial evaluation for weakness, weight loss, constipation. EXAM: CT ABDOMEN AND PELVIS WITH CONTRAST TECHNIQUE: Multidetector CT imaging of the abdomen and pelvis was performed using the standard protocol following bolus administration of intravenous contrast. CONTRAST:  170mL ISOVUE-300 IOPAMIDOL (ISOVUE-300) INJECTION 61% COMPARISON:  Prior radiograph from earlier the same day. FINDINGS: Lower chest: Mild scattered atelectatic changes present within the visualized lung bases. Visualized lungs are otherwise clear. No pleural or pericardial fusion. Hepatobiliary: Liver demonstrates a normal contrast enhanced appearance. Gallbladder within normal limits. No  biliary dilatation. Pancreas: Pancreas within normal limits. Spleen: Scattered hypodense lesions noted within the spleen, largest of which measures 9 mm. These are indeterminate, but are of doubtful clinical significance. Spleen otherwise unremarkable. Adrenals/Urinary Tract: 2.3 cm hypodense nodule noted within the right adrenal gland, most likely  a small adenoma. Left adrenal gland within normal limits. Kidneys equal in size with symmetric enhancement. Scattered parapelvic cyst noted within the left kidney. Few scattered calcifications within the left renal hilum suspicious for small nonobstructive calculi. These measure up to 3 mm. No hydronephrosis. No focal enhancing renal mass. Scattered cortical thinning/scarring noted. Ureters are of normal caliber without acute abnormality. Bladder within normal limits. Stomach/Bowel: Small hiatal hernia noted. Stomach otherwise unremarkable. Small bowel within normal limits for caliber without evidence for obstruction. Colonic diverticulosis without evidence for acute diverticulitis. Appendix not visualize, compatible with history of prior appendectomy. No abnormal wall thickening, mucosal enhancement, or inflammatory fat stranding seen about the bowels. Large amount of retained stool within the rectal vault, suggesting constipation. Vascular/Lymphatic: Normal intravascular enhancement seen throughout the intra-abdominal aorta and its branch vessels. Moderate aorto bi-iliac atherosclerotic disease. No aneurysm. No pathologically enlarged intra-abdominal or pelvic lymph nodes. Reproductive: Prostate within normal limits. Other: No free air or fluid. Small bilateral fat containing inguinal hernias noted. Musculoskeletal: No acute osseous abnormality. No worrisome lytic or blastic osseous lesions. Moderate multilevel degenerative spondylolysis noted within the visualized spine. IMPRESSION: 1. No CT evidence for acute intra-abdominal or pelvic process. 2. Large amount of  retained stool within the rectal vault, suggesting constipation. 3. Sigmoid diverticulosis without evidence for acute diverticulitis. 4. Question punctate nonobstructive left renal nephrolithiasis. Electronically Signed: By: Jeannine Boga M.D. On: 11/08/2016 18:08   Dg Abd 2 Views  Result Date: 11/10/2016 CLINICAL DATA:  Acute generalized abdominal pain. EXAM: ABDOMEN - 2 VIEW COMPARISON:  CT scan of November 08, 2016. FINDINGS: The bowel gas pattern is normal. There is no evidence of free air. No radio-opaque calculi or other significant radiographic abnormality is seen. Stool is noted in the rectum. IMPRESSION: No definite evidence of bowel obstruction or ileus. Electronically Signed   By: Marijo Conception, M.D.   On: 11/10/2016 15:53   Dg Colon Therapeutic W/cm  Result Date: 11/12/2016 CLINICAL DATA:  Severe constipation. EXAM: WATER SOLUBLE CONTRAST ENEMA TECHNIQUE: Approximately 3 L of warm water mixed with 480 cc of Gastrografin was instilled per rectum. FLUOROSCOPY TIME:  Radiation Exposure Index (if provided by the fluoroscopic device): 183.47 mGy COMPARISON:  Radiographs dated 11/10/2016 FINDINGS: EKG knee demonstrates moderate stool in the ascending colon as well as in the rectum. The study shows a 6 mm stool ball in the rectum. There is also stool throughout the remainder of the colon particularly in the cecum. There are few diverticula in the sigmoid portion the colon. The study was suboptimal due to patient's inability to move well during the exam. Multiple smaller stool balls throughout the colon. I was able to get some contrast into the ascending colon. There are no discrete strictures or definitive mass lesions. The patient was unable to sit up to evacuate the contrast at the end of the exam. IMPRESSION: Extensive stool in the distal colon as well as in the ascending colon. No dilatation of the colon. Scattered diverticula in the sigmoid portion of the colon. No discrete obstruction or  mass lesion. However, the study is suboptimal for detail because of the patient's inability to retain the contrast or move well during the exam. Electronically Signed   By: Lorriane Shire M.D.   On: 11/12/2016 12:46     CBC  Recent Labs Lab 11/14/16 0505 11/16/16 0825 11/18/16 0352  WBC 4.0 4.7 5.0  HGB 9.2* 9.8* 8.9*  HCT 26.9* 28.8* 26.9*  PLT 131* 156 177  MCV 97.8  99.0 101.5*  MCH 33.5 33.7 33.6  MCHC 34.2 34.0 33.1  RDW 14.9 15.4 16.2*    Chemistries   Recent Labs Lab 11/14/16 0505 11/16/16 0825 11/18/16 0352  NA 133* 138 136  K 4.6 3.8 3.6  CL 101 104 105  CO2 25 28 25   GLUCOSE 109* 97 135*  BUN 9 5* 6  CREATININE 0.55* 0.53* 0.58*  CALCIUM 7.4* 7.8* 7.5*   ------------------------------------------------------------------------------------------------------------------ No results for input(s): CHOL, HDL, LDLCALC, TRIG, CHOLHDL, LDLDIRECT in the last 72 hours.  Lab Results  Component Value Date   HGBA1C 5.2 11/09/2016   ------------------------------------------------------------------------------------------------------------------ No results for input(s): TSH, T4TOTAL, T3FREE, THYROIDAB in the last 72 hours.  Invalid input(s): FREET3 ------------------------------------------------------------------------------------------------------------------ No results for input(s): VITAMINB12, FOLATE, FERRITIN, TIBC, IRON, RETICCTPCT in the last 72 hours.  Coagulation profile No results for input(s): INR, PROTIME in the last 168 hours.  No results for input(s): DDIMER in the last 72 hours.  Cardiac Enzymes No results for input(s): CKMB, TROPONINI, MYOGLOBIN in the last 168 hours.  Invalid input(s): CK ------------------------------------------------------------------------------------------------------------------    Component Value Date/Time   BNP 94.3 08/15/2013 1834     Araiya Tilmon M.D on 11/20/2016 at 5:03 PM  Between 7am to 7pm - Pager -  [phone]  After 7pm go to [email or web address] - password [facility]  [facility] -  Office  [phone]

## 2016-11-20 NOTE — Progress Notes (Signed)
Provided education to pt regarding the importance of being oob into chair. Pt refused

## 2016-11-21 DIAGNOSIS — I4891 Unspecified atrial fibrillation: Secondary | ICD-10-CM | POA: Diagnosis not present

## 2016-11-21 DIAGNOSIS — N179 Acute kidney failure, unspecified: Secondary | ICD-10-CM | POA: Diagnosis not present

## 2016-11-21 DIAGNOSIS — E1169 Type 2 diabetes mellitus with other specified complication: Secondary | ICD-10-CM | POA: Diagnosis not present

## 2016-11-21 DIAGNOSIS — D490 Neoplasm of unspecified behavior of digestive system: Secondary | ICD-10-CM | POA: Diagnosis not present

## 2016-11-21 DIAGNOSIS — E119 Type 2 diabetes mellitus without complications: Secondary | ICD-10-CM | POA: Diagnosis not present

## 2016-11-21 DIAGNOSIS — R1084 Generalized abdominal pain: Secondary | ICD-10-CM | POA: Diagnosis not present

## 2016-11-21 DIAGNOSIS — R933 Abnormal findings on diagnostic imaging of other parts of digestive tract: Secondary | ICD-10-CM | POA: Diagnosis not present

## 2016-11-21 DIAGNOSIS — I10 Essential (primary) hypertension: Secondary | ICD-10-CM | POA: Diagnosis not present

## 2016-11-21 DIAGNOSIS — N183 Chronic kidney disease, stage 3 (moderate): Secondary | ICD-10-CM | POA: Diagnosis not present

## 2016-11-21 DIAGNOSIS — I482 Chronic atrial fibrillation: Secondary | ICD-10-CM | POA: Diagnosis not present

## 2016-11-21 DIAGNOSIS — D120 Benign neoplasm of cecum: Secondary | ICD-10-CM | POA: Diagnosis not present

## 2016-11-21 DIAGNOSIS — K5901 Slow transit constipation: Secondary | ICD-10-CM | POA: Diagnosis not present

## 2016-11-21 DIAGNOSIS — K5900 Constipation, unspecified: Secondary | ICD-10-CM | POA: Diagnosis not present

## 2016-11-21 LAB — GLUCOSE, CAPILLARY
GLUCOSE-CAPILLARY: 97 mg/dL (ref 65–99)
Glucose-Capillary: 161 mg/dL — ABNORMAL HIGH (ref 65–99)
Glucose-Capillary: 108 mg/dL — ABNORMAL HIGH (ref 65–99)
Glucose-Capillary: 102 mg/dL — ABNORMAL HIGH (ref 65–99)

## 2016-11-21 MED ORDER — POLYETHYLENE GLYCOL 3350 17 G PO PACK
17.0000 g | PACK | Freq: Four times a day (QID) | ORAL | Status: AC
  Administered 2016-11-21 (×2): 17 g via ORAL
  Filled 2016-11-21 (×2): qty 1

## 2016-11-21 MED ORDER — ALVIMOPAN 12 MG PO CAPS: 12.0000 mg | ORAL_CAPSULE | ORAL | Status: DC

## 2016-11-21 MED ORDER — ALVIMOPAN 12 MG PO CAPS: 12.0000 mg | ORAL_CAPSULE | Freq: Two times a day (BID) | ORAL | Status: DC

## 2016-11-21 MED ORDER — ALVIMOPAN 12 MG PO CAPS
12.0000 mg | ORAL_CAPSULE | Freq: Two times a day (BID) | ORAL | Status: DC
  Administered 2016-11-23 – 2016-11-24 (×4): 12 mg via ORAL
  Filled 2016-11-21 (×4): qty 1

## 2016-11-21 NOTE — Progress Notes (Signed)
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  NPO after 0200 12/11. Laparoscopic assisted partial colectomy tomorrow.   LOS: 13 days     4 Days Post-Op  Subjective: Still very anxious about the surgery tomorrow.  We went over the surgery and what to expect postoperatively.  Objective: Vital signs in last 24 hours: Temp:  [97.8 F (36.6 C)-98.3 F (36.8 C)] 98.3 F (36.8 C) (12/10 0648) Pulse Rate:  [61-70] 70 (12/10 0648) Resp:  [18-20] 20 (12/10 0648) BP: (103-122)/(60-65) 122/65 (12/10 0648) SpO2:  [96 %-99 %] 96 % (12/10 0648) Last BM Date: 11/20/16  Intake/Output from previous day: 12/09 0701 - 12/10 0700 In: 1920 [P.O.:1440; I.V.:480] Out: 3600 [Urine:3600] Intake/Output this shift: Total I/O In: 480 [P.O.:480] Out: -   PE: General- In NAD Abdomen-soft, obese, no masses, no hernias, not tender.  Lab Results:  No results for input(s): WBC, HGB, HCT, PLT in the last 72 hours. BMET No results for input(s): NA, K, CL, CO2, GLUCOSE, BUN, CREATININE, CALCIUM in the last 72 hours. PT/INR No results for input(s): LABPROT, INR in the last 72 hours. Comprehensive Metabolic Panel:    Component Value Date/Time   NA 136 11/18/2016 0352   NA 138 11/16/2016 0825   K 3.6 11/18/2016 0352   K 3.8 11/16/2016 0825   CL 105 11/18/2016 0352   CL 104 11/16/2016 0825   CO2 25 11/18/2016 0352   CO2 28 11/16/2016 0825   BUN 6 11/18/2016 0352   BUN 5 (L) 11/16/2016 0825   CREATININE 0.58 (L) 11/18/2016 0352   CREATININE 0.53 (L) 11/16/2016 0825   CREATININE 1.10 08/02/2014 1505   CREATININE 1.08 04/26/2014 0830   GLUCOSE 135 (H) 11/18/2016 0352   GLUCOSE 97 11/16/2016 0825   CALCIUM 7.5 (L) 11/18/2016 0352   CALCIUM 7.8 (L) 11/16/2016 0825   AST 11 (L) 11/12/2016 0430   AST 17 11/08/2016 1411   ALT 7 (L) 11/12/2016 0430   ALT 11 (L) 11/08/2016 1411   ALKPHOS 48 11/12/2016 0430   ALKPHOS 76 11/08/2016 1411   BILITOT 0.6 11/12/2016 0430   BILITOT 2.1 (H) 11/08/2016 1411   PROT 4.7 (L) 11/12/2016 0430   PROT 7.7 11/08/2016 1411   ALBUMIN 2.3 (L) 11/12/2016 0430   ALBUMIN 4.2 11/08/2016 1411     Studies/Results: No results found.  Anti-infectives: Anti-infectives    Start     Dose/Rate Route Frequency Ordered Stop   11/19/16 0600  cefoTEtan (CEFOTAN) 2 g in dextrose 5 % 50 mL IVPB     2 g 100 mL/hr over 30 Minutes Intravenous On call to O.R. 11/18/16 1645 11/20/16 0559   11/10/16 2000  cefTRIAXone (ROCEPHIN) 1 g in dextrose 5 % 50 mL IVPB     1 g 100 mL/hr over 30 Minutes Intravenous Every 24 hours 11/10/16 1847 11/14/16 2236   11/09/16 2000  cefTRIAXone (ROCEPHIN) 2 g in dextrose 5 % 50 mL IVPB  Status:  Discontinued     2 g 100 mL/hr over 30 Minutes Intravenous  Every 24 hours 11/09/16 0312 11/10/16 1847   11/08/16 2045  cefTRIAXone (ROCEPHIN) 2 g in dextrose 5 % 50 mL IVPB     2 g 100 mL/hr over 30 Minutes Intravenous  Once 11/08/16 2042 11/08/16 2229       Perri Aragones J 11/21/2016

## 2016-11-21 NOTE — Progress Notes (Signed)
Patient Demographics:    [name], is a 65 y.o. male, DOB - 1951-09-12, [id number]  Admit date - 11/08/2016   Admitting Physician Karmen Bongo, MD  Outpatient Primary MD for the patient is Sandi Mariscal, MD  LOS - [phone]   Chief Complaint  Patient presents with  . Constipation  . Weakness        Subjective:    [name] is more calmer . No new complaints.       Principal Problem:   Constipation Active Problems:   Essential hypertension, benign   Atrial fibrillation (HCC)   Diabetes mellitus type 2 in obese (HCC)   Hyponatremia   Urinary tract infection without hematuria   Hypokalemia   CKD (chronic kidney disease) stage 3, GFR 30-59 ml/min   Hyperbilirubinemia   Homelessness   Protein-calorie malnutrition, severe   Impaction of colon (HCC)   Abdominal pain   Generalized weakness   Fecal impaction (HCC)   AKI (acute kidney injury) (HCC)   Abnormal CT scan, colon   Benign neoplasm of cecum   Polyp of cecum  Brief Narrative:  65 y.o.malewith medical history significant ofA. Fib not on anticoagulation, DM Type 2, HTN, HLD, depression who presents initially with complaints of uncontrolled constipation. Patient admitted to being essentially noncompliant with any of his regimen for the past several years. In the emergency department, patient was noted to have symptomatically hyponatremia and tachycardia. Patient was also mildly hypotensive, receiving IV fluid hydration. Patient also found to have significant stool burden on CT abdomen pelvis. Fecal dis impation attempt was without success. Smog enema yielded limited success. The patient was ultimately admitted for further management of multiple medical issues. Underwent colonoscopy on 12/3 shows a polyp int he cecum, internal hemorrhoids, , ulcers in the rectosigmoid colon, sonsistent with stercoral ulcers. Moderate diverticulosis in the  sigmoid colon.    1)Possible Cecal Mass/Fecal Impaction/Constipation- for Colonoscopy on 11/17/2016, constipation/Fecal impaction appears to have Resolved. 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 Rectal ulcer shows erosive ulcer and reactive changes. For surgery 12/11 tentatively. Recommended miralax 2 to 3 times daily for constipation and repeat colonoscopy in 2 years for polyp surveillance with Dr Henrene Pastor.  NPO after midnight, clear liquid diet today and miralax. Plan for lap assisted partial colectomy. Pt slightly anxious re garding the procedure.   2) A. fib with RVR- CHADVASC2 score is 2, refuses anticoagulation, history of noncompliance, continue metoprolol and digoxin as per cardiology,   Echocardiogram was not optimal as cardiac structures were not visualized at all. Recommended TEE to assess the cardiac struction if clinically indicated.  Currently rate controlled.   3)H/o Glucose intolerance-most recent A1c is 5.2, avoid overaggressive control in order to avoid life-threatening hypoglycemia.  CBG (last 3)   Recent Labs  11/20/16 2112 11/21/16 0750 11/21/16 1145  GLUCAP 136* 97 161*      4) BPH with urinary retention- currently has Foley catheter,  (patient had in and out cath couple of times prior), continue Flomax, . No new complaints.   5)AKI- resolved, creatinine is back to 0.5, most likely was due to BPH with urinary retention and dehydration in the setting of possible UTI. Urine culture was negative,  patient completed antibiotics.   6)Severe Protein Caloric malnutrition- continue  Nutritional supplements  6) Normocytic anemia:  Stable between 8 to 9. . Monitor.   7)Social/Dispo- patient with deconditioning and debility, patient is essentially homeless, Education officer, museum consult for disposition   Consults  :  Gi, surgery.    DVT Prophylaxis  :   SCDs /Lovenox  Lab Results  Component Value Date     PLT 177 11/18/2016    Inpatient Medications  Scheduled Meds: . [START ON 11/22/2016] alvimopan  12 mg Oral 120 min pre-op  . [START ON 11/23/2016] alvimopan  12 mg Oral BID  . busPIRone  5 mg Oral TID  . Chlorhexidine Gluconate Cloth  6 each Topical Q0600  . digoxin  0.125 mg Oral Daily  . feeding supplement  1 Container Oral QID  . feeding supplement (ENSURE ENLIVE)  237 mL Oral BID BM  . hydrocortisone   Rectal BID  . insulin aspart  0-15 Units Subcutaneous TID WC  . metoprolol tartrate  50 mg Oral BID  . multivitamin with minerals  1 tablet Oral Daily  . mupirocin ointment  1 application Nasal BID  . polyethylene glycol  17 g Oral Q6H  . sodium chloride flush  3 mL Intravenous Q12H  . tamsulosin  0.4 mg Oral Daily   Continuous Infusions: . 0.9 % NaCl with KCl 20 mEq / L 20 mL/hr at 11/21/16 1030   PRN Meds:.acetaminophen **OR** acetaminophen, bisacodyl, lidocaine, liver oil-zinc oxide, LORazepam, ondansetron **OR** ondansetron (ZOFRAN) IV, traMADol    Anti-infectives    Start     Dose/Rate Route Frequency Ordered Stop   11/19/16 0600  cefoTEtan (CEFOTAN) 2 g in dextrose 5 % 50 mL IVPB     2 g 100 mL/hr over 30 Minutes Intravenous On call to O.R. 11/18/16 1645 11/20/16 0559   11/10/16 2000  cefTRIAXone (ROCEPHIN) 1 g in dextrose 5 % 50 mL IVPB     1 g 100 mL/hr over 30 Minutes Intravenous Every 24 hours 11/10/16 1847 11/14/16 2236   11/09/16 2000  cefTRIAXone (ROCEPHIN) 2 g in dextrose 5 % 50 mL IVPB  Status:  Discontinued     2 g 100 mL/hr over 30 Minutes Intravenous Every 24 hours 11/09/16 0312 11/10/16 1847   11/08/16 2045  cefTRIAXone (ROCEPHIN) 2 g in dextrose 5 % 50 mL IVPB     2 g 100 mL/hr over 30 Minutes Intravenous  Once 11/08/16 2042 11/08/16 2229        Objective:   Vitals:   11/20/16 0455 11/20/16 1400 11/20/16 2011 11/21/16 0648  BP: 121/71 103/60 108/60 122/65  Pulse: 80 61 68 70  Resp: 18 18 18 20   Temp: 98.5 F (36.9 C) 97.8 F (36.6 C)  98.1 F (36.7 C) 98.3 F (36.8 C)  TempSrc: Oral Oral Oral Oral  SpO2: 96% 99% 97% 96%  Weight:      Height:        Wt Readings from Last 3 Encounters:  11/17/16 93 kg (205 lb)  08/06/14 (!) 157.3 kg (346 lb 12.8 oz)  08/02/14 (!) 157.6 kg (347 lb 8 oz)     Intake/Output Summary (Last 24 hours) at 11/21/16 1605 Last data filed at 11/21/16 1500  Gross per 24 hour  Intake             2430 ml  Output             3600 ml  Net            -  1170 ml     Physical Exam  Gen:- Alert and comfortable.  HEENT:- Chestnut.AT, No sclera icterus Neck-Supple Neck,No JVD,.  Lungs-  CTAB ,  CV- S1, S2 normal, irregular Abd-  +ve B.Sounds, Abd Soft, Mild lower abdominal tenderness,   Extremity/Skin:- No  edema,       Data Review:   Micro Results Recent Results (from the past 240 hour(s))  Surgical pcr screen     Status: Abnormal   Collection Time: 11/19/16  7:41 AM  Result Value Ref Range Status   MRSA, PCR POSITIVE (A) NEGATIVE Final    Comment: RESULT CALLED TO, READ BACK BY AND VERIFIED WITH: PICKETT,S. RN @1111  ON 12.8.17 BY NMCCOY    Staphylococcus aureus POSITIVE (A) NEGATIVE Final    Comment:        The Xpert SA Assay (FDA approved for NASAL specimens in patients over 37 years of age), is one component of a comprehensive surveillance program.  Test performance has been validated by [facility] for patients greater than or equal to 22 year old. It is not intended to diagnose infection nor to guide or monitor treatment.     Radiology Reports Dg Abdomen 1 View  Result Date: 11/08/2016 CLINICAL DATA:  Constipation over the last month. EXAM: ABDOMEN - 1 VIEW COMPARISON:  None. FINDINGS: Gas pattern does not suggest ileus or obstruction. Amount of fecal matter in the colon is within normal limits with the exception of slightly prominent stool in the rectal vault. No significant soft tissue calcifications. Some vascular calcification noted. Chronic degenerative changes affect  the spine. IMPRESSION: No markedly abnormal findings. Overall amount of fecal matter appears within normal limits. Slightly prominent stool in the rectal vault. Electronically Signed   By: Nelson Chimes M.D.   On: 11/08/2016 14:29   Ct Head Wo Contrast  Result Date: 11/08/2016 CLINICAL DATA:  Generalized weakness. Poor historian. Difficulty raising right arm. EXAM: CT HEAD WITHOUT CONTRAST TECHNIQUE: Contiguous axial images were obtained from the base of the skull through the vertex without intravenous contrast. COMPARISON:  None. FINDINGS: Brain: Ventricles and cisterns are within normal. There is minimal prominence of the CSF spaces compatible with age related atrophy. There is no mass, mass effect, shift of midline structures or acute hemorrhage. No evidence of acute infarction. Vascular: Minimal calcified plaque over the cavernous segment of the internal carotid arteries. Skull: Within normal. Sinuses/Orbits: Within normal. IMPRESSION: No acute intracranial findings. Electronically Signed   By: Marin Olp M.D.   On: 11/08/2016 15:56   Ct Abdomen Pelvis W Contrast  Addendum Date: 11/15/2016   ADDENDUM REPORT: 11/15/2016 08:56 ADDENDUM: Case reviewed prior to barium enema. There is sessile and lobulated mural filling defect in the cecum on the right lateral wall measuring 3 cm in diameter by 12 mm in thickness. While adherent stool can give this appearance, finding is more concerning for a mass. Case discussed with Dr. Fuller Plan via telephone at time of addendum. Patient is under consideration for colonoscopy. Electronically Signed   By: Monte Fantasia M.D.   On: 11/15/2016 08:56   Result Date: 11/15/2016 CLINICAL DATA:  Initial evaluation for weakness, weight loss, constipation. EXAM: CT ABDOMEN AND PELVIS WITH CONTRAST TECHNIQUE: Multidetector CT imaging of the abdomen and pelvis was performed using the standard protocol following bolus administration of intravenous contrast. CONTRAST:  174mL  ISOVUE-300 IOPAMIDOL (ISOVUE-300) INJECTION 61% COMPARISON:  Prior radiograph from earlier the same day. FINDINGS: Lower chest: Mild scattered atelectatic changes present within the visualized lung  bases. Visualized lungs are otherwise clear. No pleural or pericardial fusion. Hepatobiliary: Liver demonstrates a normal contrast enhanced appearance. Gallbladder within normal limits. No biliary dilatation. Pancreas: Pancreas within normal limits. Spleen: Scattered hypodense lesions noted within the spleen, largest of which measures 9 mm. These are indeterminate, but are of doubtful clinical significance. Spleen otherwise unremarkable. Adrenals/Urinary Tract: 2.3 cm hypodense nodule noted within the right adrenal gland, most likely a small adenoma. Left adrenal gland within normal limits. Kidneys equal in size with symmetric enhancement. Scattered parapelvic cyst noted within the left kidney. Few scattered calcifications within the left renal hilum suspicious for small nonobstructive calculi. These measure up to 3 mm. No hydronephrosis. No focal enhancing renal mass. Scattered cortical thinning/scarring noted. Ureters are of normal caliber without acute abnormality. Bladder within normal limits. Stomach/Bowel: Small hiatal hernia noted. Stomach otherwise unremarkable. Small bowel within normal limits for caliber without evidence for obstruction. Colonic diverticulosis without evidence for acute diverticulitis. Appendix not visualize, compatible with history of prior appendectomy. No abnormal wall thickening, mucosal enhancement, or inflammatory fat stranding seen about the bowels. Large amount of retained stool within the rectal vault, suggesting constipation. Vascular/Lymphatic: Normal intravascular enhancement seen throughout the intra-abdominal aorta and its branch vessels. Moderate aorto bi-iliac atherosclerotic disease. No aneurysm. No pathologically enlarged intra-abdominal or pelvic lymph nodes. Reproductive:  Prostate within normal limits. Other: No free air or fluid. Small bilateral fat containing inguinal hernias noted. Musculoskeletal: No acute osseous abnormality. No worrisome lytic or blastic osseous lesions. Moderate multilevel degenerative spondylolysis noted within the visualized spine. IMPRESSION: 1. No CT evidence for acute intra-abdominal or pelvic process. 2. Large amount of retained stool within the rectal vault, suggesting constipation. 3. Sigmoid diverticulosis without evidence for acute diverticulitis. 4. Question punctate nonobstructive left renal nephrolithiasis. Electronically Signed: By: Jeannine Boga M.D. On: 11/08/2016 18:08   Dg Abd 2 Views  Result Date: 11/10/2016 CLINICAL DATA:  Acute generalized abdominal pain. EXAM: ABDOMEN - 2 VIEW COMPARISON:  CT scan of November 08, 2016. FINDINGS: The bowel gas pattern is normal. There is no evidence of free air. No radio-opaque calculi or other significant radiographic abnormality is seen. Stool is noted in the rectum. IMPRESSION: No definite evidence of bowel obstruction or ileus. Electronically Signed   By: Marijo Conception, M.D.   On: 11/10/2016 15:53   Dg Colon Therapeutic W/cm  Result Date: 11/12/2016 CLINICAL DATA:  Severe constipation. EXAM: WATER SOLUBLE CONTRAST ENEMA TECHNIQUE: Approximately 3 L of warm water mixed with 480 cc of Gastrografin was instilled per rectum. FLUOROSCOPY TIME:  Radiation Exposure Index (if provided by the fluoroscopic device): 183.47 mGy COMPARISON:  Radiographs dated 11/10/2016 FINDINGS: EKG knee demonstrates moderate stool in the ascending colon as well as in the rectum. The study shows a 6 mm stool ball in the rectum. There is also stool throughout the remainder of the colon particularly in the cecum. There are few diverticula in the sigmoid portion the colon. The study was suboptimal due to patient's inability to move well during the exam. Multiple smaller stool balls throughout the colon. I was able  to get some contrast into the ascending colon. There are no discrete strictures or definitive mass lesions. The patient was unable to sit up to evacuate the contrast at the end of the exam. IMPRESSION: Extensive stool in the distal colon as well as in the ascending colon. No dilatation of the colon. Scattered diverticula in the sigmoid portion of the colon. No discrete obstruction or mass lesion. However, the study  is suboptimal for detail because of the patient's inability to retain the contrast or move well during the exam. Electronically Signed   By: Lorriane Shire M.D.   On: 11/12/2016 12:46     CBC  Recent Labs Lab 11/16/16 0825 11/18/16 0352  WBC 4.7 5.0  HGB 9.8* 8.9*  HCT 28.8* 26.9*  PLT 156 177  MCV 99.0 101.5*  MCH 33.7 33.6  MCHC 34.0 33.1  RDW 15.4 16.2*    Chemistries   Recent Labs Lab 11/16/16 0825 11/18/16 0352  NA 138 136  K 3.8 3.6  CL 104 105  CO2 28 25  GLUCOSE 97 135*  BUN 5* 6  CREATININE 0.53* 0.58*  CALCIUM 7.8* 7.5*   ------------------------------------------------------------------------------------------------------------------ No results for input(s): CHOL, HDL, LDLCALC, TRIG, CHOLHDL, LDLDIRECT in the last 72 hours.  Lab Results  Component Value Date   HGBA1C 5.2 11/09/2016   ------------------------------------------------------------------------------------------------------------------ No results for input(s): TSH, T4TOTAL, T3FREE, THYROIDAB in the last 72 hours.  Invalid input(s): FREET3 ------------------------------------------------------------------------------------------------------------------ No results for input(s): VITAMINB12, FOLATE, FERRITIN, TIBC, IRON, RETICCTPCT in the last 72 hours.  Coagulation profile No results for input(s): INR, PROTIME in the last 168 hours.  No results for input(s): DDIMER in the last 72 hours.  Cardiac Enzymes No results for input(s): CKMB, TROPONINI, MYOGLOBIN in the last 168  hours.  Invalid input(s): CK ------------------------------------------------------------------------------------------------------------------    Component Value Date/Time   BNP 94.3 08/15/2013 1834     Deosha Werden M.D on 11/21/2016 at 4:05 PM  Between 7am to 7pm - Pager [phone]  After 7pm go to [email or web address] - password [facility]  [facility] -  Office  [phone]

## 2016-11-22 ENCOUNTER — Inpatient Hospital Stay (HOSPITAL_COMMUNITY): Payer: PPO | Admitting: Anesthesiology

## 2016-11-22 ENCOUNTER — Encounter (HOSPITAL_COMMUNITY)
Admission: EM | Disposition: A | Discharge status: Skilled Nursing Facility | Payer: Self-pay | Source: Home / Self Care | Attending: Internal Medicine

## 2016-11-22 DIAGNOSIS — I10 Essential (primary) hypertension: Secondary | ICD-10-CM | POA: Diagnosis not present

## 2016-11-22 DIAGNOSIS — R933 Abnormal findings on diagnostic imaging of other parts of digestive tract: Secondary | ICD-10-CM | POA: Diagnosis not present

## 2016-11-22 DIAGNOSIS — K5901 Slow transit constipation: Secondary | ICD-10-CM | POA: Diagnosis not present

## 2016-11-22 DIAGNOSIS — K6389 Other specified diseases of intestine: Secondary | ICD-10-CM | POA: Diagnosis not present

## 2016-11-22 DIAGNOSIS — I452 Bifascicular block: Secondary | ICD-10-CM | POA: Diagnosis not present

## 2016-11-22 DIAGNOSIS — I4892 Unspecified atrial flutter: Secondary | ICD-10-CM | POA: Diagnosis not present

## 2016-11-22 DIAGNOSIS — K5900 Constipation, unspecified: Secondary | ICD-10-CM | POA: Diagnosis not present

## 2016-11-22 DIAGNOSIS — I4891 Unspecified atrial fibrillation: Secondary | ICD-10-CM | POA: Diagnosis not present

## 2016-11-22 DIAGNOSIS — C180 Malignant neoplasm of cecum: Secondary | ICD-10-CM | POA: Diagnosis not present

## 2016-11-22 DIAGNOSIS — D696 Thrombocytopenia, unspecified: Secondary | ICD-10-CM | POA: Diagnosis not present

## 2016-11-22 DIAGNOSIS — E1141 Type 2 diabetes mellitus with diabetic mononeuropathy: Secondary | ICD-10-CM | POA: Diagnosis not present

## 2016-11-22 DIAGNOSIS — E119 Type 2 diabetes mellitus without complications: Secondary | ICD-10-CM | POA: Diagnosis not present

## 2016-11-22 DIAGNOSIS — E1165 Type 2 diabetes mellitus with hyperglycemia: Secondary | ICD-10-CM | POA: Diagnosis not present

## 2016-11-22 DIAGNOSIS — I482 Chronic atrial fibrillation: Secondary | ICD-10-CM | POA: Diagnosis not present

## 2016-11-22 DIAGNOSIS — E43 Unspecified severe protein-calorie malnutrition: Secondary | ICD-10-CM | POA: Diagnosis not present

## 2016-11-22 DIAGNOSIS — N183 Chronic kidney disease, stage 3 (moderate): Secondary | ICD-10-CM | POA: Diagnosis not present

## 2016-11-22 DIAGNOSIS — N179 Acute kidney failure, unspecified: Secondary | ICD-10-CM | POA: Diagnosis not present

## 2016-11-22 DIAGNOSIS — E1169 Type 2 diabetes mellitus with other specified complication: Secondary | ICD-10-CM | POA: Diagnosis not present

## 2016-11-22 DIAGNOSIS — D490 Neoplasm of unspecified behavior of digestive system: Secondary | ICD-10-CM | POA: Diagnosis not present

## 2016-11-22 DIAGNOSIS — K633 Ulcer of intestine: Secondary | ICD-10-CM | POA: Diagnosis not present

## 2016-11-22 DIAGNOSIS — E1122 Type 2 diabetes mellitus with diabetic chronic kidney disease: Secondary | ICD-10-CM | POA: Diagnosis not present

## 2016-11-22 DIAGNOSIS — R17 Unspecified jaundice: Secondary | ICD-10-CM | POA: Diagnosis not present

## 2016-11-22 DIAGNOSIS — E871 Hypo-osmolality and hyponatremia: Secondary | ICD-10-CM | POA: Diagnosis not present

## 2016-11-22 DIAGNOSIS — D120 Benign neoplasm of cecum: Secondary | ICD-10-CM | POA: Diagnosis not present

## 2016-11-22 DIAGNOSIS — R1084 Generalized abdominal pain: Secondary | ICD-10-CM | POA: Diagnosis not present

## 2016-11-22 HISTORY — PX: COLON RESECTION: SHX5231

## 2016-11-22 LAB — CBC
HCT: 28.2 % — ABNORMAL LOW (ref 39.0–52.0)
Hemoglobin: 9.5 g/dL — ABNORMAL LOW (ref 13.0–17.0)
MCH: 33.1 pg (ref 26.0–34.0)
MCHC: 33.7 g/dL (ref 30.0–36.0)
MCV: 98.3 fL (ref 78.0–100.0)
PLATELETS: 221 10*3/uL (ref 150–400)
RBC: 2.87 MIL/uL — ABNORMAL LOW (ref 4.22–5.81)
RDW: 16.6 % — AB (ref 11.5–15.5)
WBC: 4.5 10*3/uL (ref 4.0–10.5)

## 2016-11-22 LAB — BASIC METABOLIC PANEL
Anion gap: 7 (ref 5–15)
BUN: 8 mg/dL (ref 6–20)
CALCIUM: 7.9 mg/dL — AB (ref 8.9–10.3)
CO2: 26 mmol/L (ref 22–32)
CREATININE: 0.58 mg/dL — AB (ref 0.61–1.24)
Chloride: 104 mmol/L (ref 101–111)
GFR calc Af Amer: >60 mL/min (ref >60)
GFR calc non Af Amer: >60 mL/min (ref >60)
GLUCOSE: 103 mg/dL — AB (ref 65–99)
Potassium: 4 mmol/L (ref 3.5–5.1)
Sodium: 137 mmol/L (ref 135–145)

## 2016-11-22 LAB — GLUCOSE, CAPILLARY
GLUCOSE-CAPILLARY: 103 mg/dL — AB (ref 65–99)
GLUCOSE-CAPILLARY: 189 mg/dL — AB (ref 65–99)
Glucose-Capillary: 98 mg/dL (ref 65–99)
Glucose-Capillary: 178 mg/dL — ABNORMAL HIGH (ref 65–99)

## 2016-11-22 LAB — TYPE AND SCREEN
ABO/RH(D): O NEG
Antibody Screen: NEGATIVE

## 2016-11-22 LAB — ABO/RH: ABO/RH(D): O NEG

## 2016-11-22 SURGERY — COLECTOMY, HAND-ASSISTED, LAPAROSCOPIC
Anesthesia: General | Site: Abdomen

## 2016-11-22 MED ORDER — PROPOFOL 10 MG/ML IV BOLUS
INTRAVENOUS | Status: AC
  Filled 2016-11-22: qty 20

## 2016-11-22 MED ORDER — LACTATED RINGERS IR SOLN
Status: DC | PRN
  Administered 2016-11-22: 3000 mL

## 2016-11-22 MED ORDER — PHENYLEPHRINE HCL 10 MG/ML IJ SOLN
INTRAMUSCULAR | Status: AC
  Filled 2016-11-22: qty 1

## 2016-11-22 MED ORDER — DIPHENHYDRAMINE HCL 12.5 MG/5ML PO ELIX: 12.5000 mg | ORAL_SOLUTION | Freq: Four times a day (QID) | ORAL | Status: DC | PRN

## 2016-11-22 MED ORDER — BUPIVACAINE HCL (PF) 0.25 % IJ SOLN
INTRAMUSCULAR | Status: DC | PRN
  Administered 2016-11-22: 15 mL

## 2016-11-22 MED ORDER — MORPHINE SULFATE 2 MG/ML IV SOLN
INTRAVENOUS | Status: DC
  Administered 2016-11-22: 2 mg via INTRAVENOUS
  Administered 2016-11-23: 6 mg via INTRAVENOUS
  Administered 2016-11-23: 1 mg via INTRAVENOUS
  Administered 2016-11-23: 8 mg via INTRAVENOUS
  Administered 2016-11-23: 3 mg via INTRAVENOUS
  Administered 2016-11-23: 1 mg via INTRAVENOUS
  Administered 2016-11-24: 9 mg via INTRAVENOUS
  Administered 2016-11-24: 2 mg via INTRAVENOUS
  Administered 2016-11-24: 15:00:00 via INTRAVENOUS
  Administered 2016-11-24: 2 mg via INTRAVENOUS
  Administered 2016-11-24: 13 mg via INTRAVENOUS
  Filled 2016-11-22: qty 25

## 2016-11-22 MED ORDER — SUGAMMADEX SODIUM 200 MG/2ML IV SOLN
INTRAVENOUS | Status: DC | PRN
  Administered 2016-11-22: 200 mg via INTRAVENOUS

## 2016-11-22 MED ORDER — PHENYLEPHRINE HCL 10 MG/ML IJ SOLN
INTRAMUSCULAR | Status: DC | PRN
  Administered 2016-11-22: 100 ug/min via INTRAVENOUS

## 2016-11-22 MED ORDER — FENTANYL CITRATE (PF) 100 MCG/2ML IJ SOLN
INTRAMUSCULAR | Status: AC
  Filled 2016-11-22: qty 2

## 2016-11-22 MED ORDER — NALOXONE HCL 0.4 MG/ML IJ SOLN
0.4000 mg | INTRAMUSCULAR | Status: DC | PRN
Start: 2016-11-22 — End: 2016-11-24

## 2016-11-22 MED ORDER — MIDAZOLAM HCL 5 MG/5ML IJ SOLN
INTRAMUSCULAR | Status: DC | PRN
  Administered 2016-11-22: 2 mg via INTRAVENOUS

## 2016-11-22 MED ORDER — PHENYLEPHRINE HCL 10 MG/ML IJ SOLN
INTRAMUSCULAR | Status: DC | PRN
  Administered 2016-11-22 (×3): 80 ug via INTRAVENOUS

## 2016-11-22 MED ORDER — SUCCINYLCHOLINE CHLORIDE 200 MG/10ML IV SOSY
PREFILLED_SYRINGE | INTRAVENOUS | Status: AC
  Filled 2016-11-22: qty 10

## 2016-11-22 MED ORDER — CEFOTETAN DISODIUM 2 G IJ SOLR
INTRAMUSCULAR | Status: DC | PRN
  Administered 2016-11-22: 2 g via INTRAVENOUS

## 2016-11-22 MED ORDER — DEXAMETHASONE SODIUM PHOSPHATE 10 MG/ML IJ SOLN
INTRAMUSCULAR | Status: DC | PRN
  Administered 2016-11-22: 10 mg via INTRAVENOUS

## 2016-11-22 MED ORDER — PROPOFOL 10 MG/ML IV BOLUS
INTRAVENOUS | Status: DC | PRN
  Administered 2016-11-22: 150 mg via INTRAVENOUS

## 2016-11-22 MED ORDER — ONDANSETRON HCL 4 MG/2ML IJ SOLN
INTRAMUSCULAR | Status: AC
  Filled 2016-11-22: qty 2

## 2016-11-22 MED ORDER — METOPROLOL TARTRATE 5 MG/5ML IV SOLN
2.0000 mg | Freq: Once | INTRAVENOUS | Status: AC
  Administered 2016-11-22: 2 mg via INTRAVENOUS

## 2016-11-22 MED ORDER — LACTATED RINGERS IV SOLN
INTRAVENOUS | Status: DC | PRN
  Administered 2016-11-22 (×2): via INTRAVENOUS

## 2016-11-22 MED ORDER — METOPROLOL TARTRATE 5 MG/5ML IV SOLN
INTRAVENOUS | Status: AC
  Administered 2016-11-22: 2 mg via INTRAVENOUS
  Filled 2016-11-22: qty 5

## 2016-11-22 MED ORDER — FENTANYL CITRATE (PF) 100 MCG/2ML IJ SOLN
INTRAMUSCULAR | Status: DC | PRN
  Administered 2016-11-22: 50 ug via INTRAVENOUS
  Administered 2016-11-22 (×2): 25 ug via INTRAVENOUS

## 2016-11-22 MED ORDER — DIPHENHYDRAMINE HCL 50 MG/ML IJ SOLN
12.5000 mg | Freq: Four times a day (QID) | INTRAMUSCULAR | Status: DC | PRN
Start: 2016-11-22 — End: 2016-11-24

## 2016-11-22 MED ORDER — LIDOCAINE 2% (20 MG/ML) 5 ML SYRINGE
INTRAMUSCULAR | Status: AC
  Filled 2016-11-22: qty 5

## 2016-11-22 MED ORDER — ONDANSETRON HCL 4 MG/2ML IJ SOLN: 4.0000 mg | Freq: Four times a day (QID) | INTRAMUSCULAR | Status: DC | PRN

## 2016-11-22 MED ORDER — 0.9 % SODIUM CHLORIDE (POUR BTL) OPTIME
TOPICAL | Status: DC | PRN
  Administered 2016-11-22: 2000 mL
  Administered 2016-11-22: 3000 mL

## 2016-11-22 MED ORDER — DILTIAZEM LOAD VIA INFUSION
10.0000 mg | Freq: Once | INTRAVENOUS | Status: AC
  Administered 2016-11-22: 10 mg via INTRAVENOUS
  Filled 2016-11-22: qty 10

## 2016-11-22 MED ORDER — BUPIVACAINE HCL 0.25 % IJ SOLN
INTRAMUSCULAR | Status: AC
  Filled 2016-11-22: qty 1

## 2016-11-22 MED ORDER — ALVIMOPAN 12 MG PO CAPS
12.0000 mg | ORAL_CAPSULE | ORAL | Status: AC
  Administered 2016-11-22: 12 mg via ORAL
  Filled 2016-11-22: qty 1

## 2016-11-22 MED ORDER — DEXAMETHASONE SODIUM PHOSPHATE 10 MG/ML IJ SOLN
INTRAMUSCULAR | Status: AC
  Filled 2016-11-22: qty 1

## 2016-11-22 MED ORDER — ONDANSETRON HCL 4 MG/2ML IJ SOLN
INTRAMUSCULAR | Status: DC | PRN
  Administered 2016-11-22: 4 mg via INTRAVENOUS

## 2016-11-22 MED ORDER — HYDROMORPHONE HCL 1 MG/ML IJ SOLN
0.2500 mg | INTRAMUSCULAR | Status: DC | PRN
  Administered 2016-11-22 (×4): 0.25 mg via INTRAVENOUS

## 2016-11-22 MED ORDER — DILTIAZEM HCL 100 MG IV SOLR
5.0000 mg/h | INTRAVENOUS | Status: DC
  Administered 2016-11-22: 5 mg/h via INTRAVENOUS
  Filled 2016-11-22: qty 100

## 2016-11-22 MED ORDER — ROCURONIUM BROMIDE 50 MG/5ML IV SOSY
PREFILLED_SYRINGE | INTRAVENOUS | Status: AC
  Filled 2016-11-22: qty 5

## 2016-11-22 MED ORDER — SUGAMMADEX SODIUM 200 MG/2ML IV SOLN
INTRAVENOUS | Status: AC
Start: 2016-11-22 — End: 2016-11-22
  Filled 2016-11-22: qty 2

## 2016-11-22 MED ORDER — SODIUM CHLORIDE 0.9% FLUSH: 9.0000 mL | INTRAVENOUS | Status: DC | PRN

## 2016-11-22 MED ORDER — SUCCINYLCHOLINE CHLORIDE 200 MG/10ML IV SOSY
PREFILLED_SYRINGE | INTRAVENOUS | Status: DC | PRN
  Administered 2016-11-22: 140 mg via INTRAVENOUS

## 2016-11-22 MED ORDER — LIDOCAINE 2% (20 MG/ML) 5 ML SYRINGE
INTRAMUSCULAR | Status: DC | PRN
  Administered 2016-11-22: 100 mg via INTRAVENOUS

## 2016-11-22 MED ORDER — LACTATED RINGERS IV BOLUS (SEPSIS)
500.0000 mL | Freq: Once | INTRAVENOUS | Status: AC
  Administered 2016-11-22: 500 mL via INTRAVENOUS

## 2016-11-22 MED ORDER — HYDROMORPHONE HCL 2 MG/ML IJ SOLN
INTRAMUSCULAR | Status: AC
  Administered 2016-11-22: 0.25 mg
  Filled 2016-11-22: qty 1

## 2016-11-22 MED ORDER — POTASSIUM CHLORIDE IN NACL 20-0.9 MEQ/L-% IV SOLN
INTRAVENOUS | Status: DC
  Administered 2016-11-22 – 2016-11-26 (×8): via INTRAVENOUS
  Filled 2016-11-22 (×12): qty 1000

## 2016-11-22 MED ORDER — CEFOTETAN DISODIUM-DEXTROSE 2-2.08 GM-% IV SOLR
INTRAVENOUS | Status: AC
  Filled 2016-11-22: qty 50

## 2016-11-22 MED ORDER — MIDAZOLAM HCL 2 MG/2ML IJ SOLN
INTRAMUSCULAR | Status: AC
  Filled 2016-11-22: qty 2

## 2016-11-22 MED ORDER — ROCURONIUM BROMIDE 10 MG/ML (PF) SYRINGE
PREFILLED_SYRINGE | INTRAVENOUS | Status: DC | PRN
  Administered 2016-11-22 (×3): 10 mg via INTRAVENOUS
  Administered 2016-11-22: 50 mg via INTRAVENOUS

## 2016-11-22 SURGICAL SUPPLY — 72 items
APPLIER CLIP 5 13 M/L LIGAMAX5 (MISCELLANEOUS)
APPLIER CLIP ROT 10 11.4 M/L (STAPLE)
BLADE EXTENDED COATED 6.5IN (ELECTRODE) ×3 IMPLANT
BLADE HEX COATED 2.75 (ELECTRODE) ×3 IMPLANT
CABLE HIGH FREQUENCY MONO STRZ (ELECTRODE) ×3 IMPLANT
CELLS DAT CNTRL 66122 CELL SVR (MISCELLANEOUS) IMPLANT
CLIP APPLIE 5 13 M/L LIGAMAX5 (MISCELLANEOUS) IMPLANT
CLIP APPLIE ROT 10 11.4 M/L (STAPLE) IMPLANT
COUNTER NEEDLE 20 DBL MAG RED (NEEDLE) ×3 IMPLANT
COVER MAYO STAND STRL (DRAPES) ×9 IMPLANT
COVER SURGICAL LIGHT HANDLE (MISCELLANEOUS) ×6 IMPLANT
DECANTER SPIKE VIAL GLASS SM (MISCELLANEOUS) ×3 IMPLANT
DISSECTOR BLUNT TIP ENDO 5MM (MISCELLANEOUS) IMPLANT
DRAIN CHANNEL 19F RND (DRAIN) IMPLANT
DRAPE LAPAROSCOPIC ABDOMINAL (DRAPES) IMPLANT
DRAPE SURG IRRIG POUCH 19X23 (DRAPES) ×6 IMPLANT
DRSG OPSITE POSTOP 4X10 (GAUZE/BANDAGES/DRESSINGS) IMPLANT
DRSG OPSITE POSTOP 4X6 (GAUZE/BANDAGES/DRESSINGS) IMPLANT
DRSG OPSITE POSTOP 4X8 (GAUZE/BANDAGES/DRESSINGS) ×3 IMPLANT
ELECT REM PT RETURN 15FT ADLT (MISCELLANEOUS) ×3 IMPLANT
EVACUATOR SILICONE 100CC (DRAIN) IMPLANT
FILTER SMOKE EVAC LAPAROSHD (FILTER) IMPLANT
GAUZE SPONGE 2X2 8PLY STRL LF (GAUZE/BANDAGES/DRESSINGS) ×1 IMPLANT
GAUZE SPONGE 4X4 12PLY STRL (GAUZE/BANDAGES/DRESSINGS) IMPLANT
GLOVE ECLIPSE 8.0 STRL XLNG CF (GLOVE) ×6 IMPLANT
GLOVE INDICATOR 8.0 STRL GRN (GLOVE) ×6 IMPLANT
GOWN STRL REUS W/TWL XL LVL3 (GOWN DISPOSABLE) ×12 IMPLANT
HANDLE SUCTION POOLE (INSTRUMENTS) ×1 IMPLANT
HOLDER FOLEY CATH W/STRAP (MISCELLANEOUS) ×3 IMPLANT
IRRIG SUCT STRYKERFLOW 2 WTIP (MISCELLANEOUS) ×3
IRRIGATION SUCT STRKRFLW 2 WTP (MISCELLANEOUS) ×1 IMPLANT
LEGGING LITHOTOMY PAIR STRL (DRAPES) IMPLANT
LIGASURE IMPACT 36 18CM CVD LR (INSTRUMENTS) ×3 IMPLANT
PACK COLON (CUSTOM PROCEDURE TRAY) ×3 IMPLANT
PAD POSITIONING PINK XL (MISCELLANEOUS) IMPLANT
PORT LAP GEL ALEXIS MED 5-9CM (MISCELLANEOUS) IMPLANT
RELOAD PROXIMATE 75MM BLUE (ENDOMECHANICALS) ×6 IMPLANT
RTRCTR WOUND ALEXIS 18CM MED (MISCELLANEOUS)
SCISSORS LAP 5X35 DISP (ENDOMECHANICALS) ×3 IMPLANT
SEALER TISSUE X1 CVD JAW (INSTRUMENTS) IMPLANT
SET IRRIG TUBING LAPAROSCOPIC (IRRIGATION / IRRIGATOR) ×3 IMPLANT
SHEARS HARMONIC ACE PLUS 36CM (ENDOMECHANICALS) ×3 IMPLANT
SLEEVE XCEL OPT CAN 5 100 (ENDOMECHANICALS) ×9 IMPLANT
SPONGE DRAIN TRACH 4X4 STRL 2S (GAUZE/BANDAGES/DRESSINGS) IMPLANT
SPONGE GAUZE 2X2 STER 10/PKG (GAUZE/BANDAGES/DRESSINGS) ×2
STAPLER GUN LINEAR PROX 60 (STAPLE) ×3 IMPLANT
STAPLER PROXIMATE 75MM BLUE (STAPLE) ×3 IMPLANT
STAPLER VISISTAT 35W (STAPLE) ×3 IMPLANT
SUCTION POOLE HANDLE (INSTRUMENTS) ×3
SUT ETHILON 3 0 PS 1 (SUTURE) IMPLANT
SUT PDS AB 1 CTX 36 (SUTURE) ×12 IMPLANT
SUT PDS AB 1 TP1 96 (SUTURE) IMPLANT
SUT PROLENE 2 0 SH DA (SUTURE) ×3 IMPLANT
SUT SILK 2 0 (SUTURE) ×2
SUT SILK 2 0 SH CR/8 (SUTURE) ×3 IMPLANT
SUT SILK 2-0 18XBRD TIE 12 (SUTURE) ×1 IMPLANT
SUT SILK 3 0 (SUTURE) ×2
SUT SILK 3 0 SH CR/8 (SUTURE) ×6 IMPLANT
SUT SILK 3-0 18XBRD TIE 12 (SUTURE) ×1 IMPLANT
SUT VICRYL 2 0 18  UND BR (SUTURE) ×2
SUT VICRYL 2 0 18 UND BR (SUTURE) ×1 IMPLANT
SYS LAPSCP GELPORT 120MM (MISCELLANEOUS) ×3
SYSTEM LAPSCP GELPORT 120MM (MISCELLANEOUS) ×1 IMPLANT
TOWEL OR 17X26 10 PK STRL BLUE (TOWEL DISPOSABLE) ×6 IMPLANT
TOWEL OR NON WOVEN STRL DISP B (DISPOSABLE) ×3 IMPLANT
TRAY FOLEY BAG SILVER LF 14FR (CATHETERS) IMPLANT
TRAY FOLEY W/METER SILVER 16FR (SET/KITS/TRAYS/PACK) IMPLANT
TROCAR BLADELESS OPT 5 100 (ENDOMECHANICALS) ×3 IMPLANT
TROCAR XCEL BLUNT TIP 100MML (ENDOMECHANICALS) IMPLANT
TROCAR XCEL NON-BLD 11X100MML (ENDOMECHANICALS) IMPLANT
TUBING INSUF HEATED (TUBING) ×3 IMPLANT
YANKAUER SUCT BULB TIP 10FT TU (MISCELLANEOUS) ×3 IMPLANT

## 2016-11-22 NOTE — Anesthesia Procedure Notes (Signed)
Procedure Name: Intubation Date/Time: 11/22/2016 2:02 PM Performed by: Lind Covert Pre-anesthesia Checklist: Patient identified, Emergency Drugs available, Suction available, Patient being monitored and Timeout performed Patient Re-evaluated:Patient Re-evaluated prior to inductionOxygen Delivery Method: Circle system utilized Preoxygenation: Pre-oxygenation with 100% oxygen Intubation Type: IV induction Laryngoscope Size: Mac and 4 Grade View: Grade I Tube type: Oral Tube size: 7.5 mm Number of attempts: 1 Airway Equipment and Method: Stylet Secured at: 23 cm Tube secured with: Tape Dental Injury: Teeth and Oropharynx as per pre-operative assessment

## 2016-11-22 NOTE — Progress Notes (Signed)
Pt in the OR. Will check on next day for OT.   [place], [place]

## 2016-11-22 NOTE — Progress Notes (Signed)
PT Cancellation Note  Patient Details Name: [name] MRN: [id number] DOB: Apr 12, 1951   Cancelled Treatment:    Reason Eval/Treat Not Completed: Patient at procedure or test/unavailable   Trevia Nop,KATHrine E 11/22/2016, 1:49 PM Carmelia Bake, PT, DPT 11/22/2016 Pager: [phone]

## 2016-11-22 NOTE — Op Note (Signed)
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  Asst:  Jens Som M.D.  Anesthesia:  General  EBL:  250 cc  Specimen:   Right colon and terminal ileum  Indication:  This is a 65 year old male who came to the emergency department complaining of severe constipation. CT scan was performed because of abdominal pain and this demonstrated a potential mass in the right colon. Colonoscopy demonstrated a 45 x 35 mm polyp in the cecum that was biopsied. Biopsy demonstrated a tubular villous adenoma with a focus of high-grade dysplasia. He now presents for above procedure.  Procedure Detail:/He was brought to the operating room placed supine on the operating table and a general anesthetic was administered. A Foley catheter was inserted. An oral gastric tube was inserted. Hair on the abdominal wall was clipped. The abdominal wall was widely sterilely prepped and draped.  He was placed in slight reverse Trendelenburg position. A 5 mm incision was made in the left subcostal area. Using a 5 mm Optiview trocar and laparoscope, access was gained to the peritoneal cavity and a pneumoperitoneum was created. Inspection of the area under the trocar demonstrated no evidence of bleeding or organ injury.  A 5 mm trocar was placed in the left mid abdominal wall. There were significant adhesions between the omentum and the anterior abdominal wall in the midline and right abdomen. Using sharp and blunt dissection, I mobilized these adhesions free from the abdominal wall. A 5 mm trocar was then placed in the lower midline and one also in the right lower quadrant. I then identified the mid transverse colon and began dissecting the omentum free from it. There are multiple intraloop colonic  adhesions near the hepatic flexure as well as adhesions to the small bowel which were mobilized. The cecum was identified and was adherent to the anterior abdominal wall under a previous scar in the right lower quadrant. It was partially mobilized by dividing some of the adhesions. The hepatic flexure was partially mobilized as well. The right colon was immobilized by dividing its lateral attachments. I then made a peri-umbilical extraction site incision to include the 5 mm lower midline trocar and inserted the GelPort. Using hand assistance I was able to further mobilize the cecum and the hepatic flexure. I then exteriorized the right colon, proximal transverse colon, and terminal ileum.    A small defect was placed in the mesentery adjacent to the ileum. Just proximal to the middle colic vessels, a small defect was placed in the mesentery adjacent to the colon. The ileum and then proximal transverse colon were both divided with the linear cutting stapler.   Using a LigaSure device I performed a wedge-shaped mesenteric resection.  Next, an enterotomy and colotomy were made and a side-to-side anastomosis was created using a linear cutting stapler. Staple lines were hemostatic. I completed the resection and closure of the common defect using a linear noncutting stapler.   A crotch stitch of 3-0 silk was placed at the distal anastomotic staple line. I then inverted the linear noncutting staple line with interrupted 3-0 silk sutures in a Lembert type fashion. The anastomosis was patent, viable, and under no tension. It was dropped back into the abdominal cavity. Abdominal cavity was irrigated with 1 L of irrigation.  Following this, gloves, gowns, and instruments  were changed. Two more liters of irrigation was used to irrigate out the abdominal cavity. There is no evidence of organ injury or bleeding. The fascia of the limited midline incision was then closed with a running #1 PDS suture. Repeat laparoscopy was  performed and a 4 quadrant as well as central inspection was performed. There was no evidence of bleeding or organ injury. Residual irrigation fluid was evacuated. The fascial closure was solid. The pneumoperitoneum was released and the trocars were removed.  The subcutaneous tissue of the extraction site incision was irrigated. The skin was closed with a combination of staples and benzoin and Steri-Strips. Trocar site incisions were closed with 4-0 Monocryl subcuticular stitches.   Sterile dressings were applied to all wounds.  He tolerated the procedure well without any apparent complications and was taken to the recovery room in satisfactory condition.

## 2016-11-22 NOTE — Transfer of Care (Signed)
Immediate Anesthesia Transfer of Care Note  Patient: [name]  Procedure(s) Performed: Procedure(s): HAND ASSISTED LAPAROSCOPIC COLON RESECTION (N/A)  Patient Location: PACU  Anesthesia Type:General  Level of Consciousness:  sedated, patient cooperative and responds to stimulation  Airway & Oxygen Therapy:Patient Spontanous Breathing and Patient connected to face mask oxgen  Post-op Assessment:  Report given to PACU RN and Post -op Vital signs reviewed and stable  Post vital signs:  Reviewed and stable  Last Vitals:  Vitals:   11/22/16 0500 11/22/16 1051  BP: 114/63 126/74  Pulse: 78 81  Resp: 20   Temp: 37 C     Complications: No apparent anesthesia complications

## 2016-11-22 NOTE — Progress Notes (Signed)
Patient Demographics:    [name], is a 65 y.o. male, DOB - 11/10/1951, PE:[id number]  Admit date - 11/08/2016   Admitting Physician Karmen Bongo, MD  Outpatient Primary MD for the patient is Sandi Mariscal, MD  LOS - 7   Chief Complaint  Patient presents with  . Constipation  . Weakness        Subjective:    [name] is anxious about surgery today.       Principal Problem:   Constipation Active Problems:   Essential hypertension, benign   Atrial fibrillation (HCC)   Diabetes mellitus type 2 in obese (HCC)   Hyponatremia   Urinary tract infection without hematuria   Hypokalemia   CKD (chronic kidney disease) stage 3, GFR 30-59 ml/min   Hyperbilirubinemia   Homelessness   Protein-calorie malnutrition, severe   Impaction of colon (HCC)   Abdominal pain   Generalized weakness   Fecal impaction (HCC)   AKI (acute kidney injury) (HCC)   Abnormal CT scan, colon   Benign neoplasm of cecum   Polyp of cecum  Brief Narrative:  65 y.o.malewith medical history significant ofA. Fib not on anticoagulation, DM Type 2, HTN, HLD, depression who presents initially with complaints of uncontrolled constipation. Patient admitted to being essentially noncompliant with any of his regimen for the past several years. In the emergency department, patient was noted to have symptomatically hyponatremia and tachycardia. Patient was also mildly hypotensive, receiving IV fluid hydration. Patient also found to have significant stool burden on CT abdomen pelvis. Fecal dis impation attempt was without success. Smog enema yielded limited success. The patient was ultimately admitted for further management of multiple medical issues. Underwent colonoscopy on 12/3 shows a polyp int he cecum, internal hemorrhoids, , ulcers in the rectosigmoid colon, sonsistent with stercoral ulcers. Moderate diverticulosis in the  sigmoid colon.    1)Possible Cecal Mass/Fecal Impaction/Constipation- for Colonoscopy on 11/17/2016, constipation/Fecal impaction appears to have Resolved. 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 Rectal ulcer shows erosive ulcer and reactive changes. For surgery 12/11 tentatively. Recommended miralax 2 to 3 times daily for constipation and repeat colonoscopy in 2 years for polyp surveillance with Dr Henrene Pastor.  Awaiting for lap assisted partial colectomy today. Pt slightly anxious re garding the procedure.   2) A. fib with RVR- CHADVASC2 score is 2, refuses anticoagulation, history of noncompliance, continue metoprolol and digoxin as per cardiology,   Echocardiogram was not optimal as cardiac structures were not visualized at all. Recommended TEE to assess the cardiac struction if clinically indicated.  Currently rate controlled.   3)H/o Glucose intolerance-most recent A1c is 5.2, avoid overaggressive control in order to avoid life-threatening hypoglycemia.  CBG (last 3)   Recent Labs  11/22/16 0739 11/22/16 1233 11/22/16 1807  GLUCAP 103* 98 189*      4) BPH with urinary retention- currently has Foley catheter,  (patient had in and out cath couple of times prior), continue Flomax, . No new complaints.   5)AKI- resolved, creatinine is back to 0.5, most likely was due to BPH with urinary retention and dehydration in the setting of possible UTI. Urine culture was negative, patient completed antibiotics.   6)Severe Protein Caloric malnutrition- continue  Nutritional supplements  6) Normocytic anemia:  Stable between 8 to 9. . Monitor.   7)Social/Dispo- patient with deconditioning and debility, patient is essentially homeless, Education officer, museum consult for disposition   Consults  :  Gi, surgery.    DVT Prophylaxis  :   SCDs /Lovenox  Lab Results  Component Value Date   PLT 221 11/22/2016    Inpatient  Medications  Scheduled Meds: . [MAR Hold] alvimopan  12 mg Oral BID  . [MAR Hold] busPIRone  5 mg Oral TID  . [MAR Hold] Chlorhexidine Gluconate Cloth  6 each Topical Q0600  . [MAR Hold] digoxin  0.125 mg Oral Daily  . [MAR Hold] feeding supplement  1 Container Oral QID  . [MAR Hold] feeding supplement (ENSURE ENLIVE)  237 mL Oral BID BM  . [MAR Hold] hydrocortisone   Rectal BID  . [MAR Hold] insulin aspart  0-15 Units Subcutaneous TID WC  . lactated ringers  500 mL Intravenous Once  . [MAR Hold] metoprolol tartrate  50 mg Oral BID  . morphine   Intravenous Q4H  . [MAR Hold] multivitamin with minerals  1 tablet Oral Daily  . [MAR Hold] mupirocin ointment  1 application Nasal BID  . [MAR Hold] sodium chloride flush  3 mL Intravenous Q12H  . [MAR Hold] tamsulosin  0.4 mg Oral Daily   Continuous Infusions: . 0.9 % NaCl with KCl 20 mEq / L 20 mL/hr at 11/21/16 1030   PRN Meds:.[MAR Hold] acetaminophen **OR** [MAR Hold] acetaminophen, [MAR Hold] bisacodyl, HYDROmorphone (DILAUDID) injection, [MAR Hold] lidocaine, [MAR Hold] liver oil-zinc oxide, [MAR Hold] LORazepam, [MAR Hold] ondansetron **OR** [MAR Hold] ondansetron (ZOFRAN) IV, [MAR Hold] traMADol    Anti-infectives    Start     Dose/Rate Route Frequency Ordered Stop   11/19/16 0600  cefoTEtan (CEFOTAN) 2 g in dextrose 5 % 50 mL IVPB     2 g 100 mL/hr over 30 Minutes Intravenous On call to O.R. 11/18/16 1645 11/20/16 0559   11/10/16 2000  cefTRIAXone (ROCEPHIN) 1 g in dextrose 5 % 50 mL IVPB     1 g 100 mL/hr over 30 Minutes Intravenous Every 24 hours 11/10/16 1847 11/14/16 2236   11/09/16 2000  cefTRIAXone (ROCEPHIN) 2 g in dextrose 5 % 50 mL IVPB  Status:  Discontinued     2 g 100 mL/hr over 30 Minutes Intravenous Every 24 hours 11/09/16 0312 11/10/16 1847   11/08/16 2045  cefTRIAXone (ROCEPHIN) 2 g in dextrose 5 % 50 mL IVPB     2 g 100 mL/hr over 30 Minutes Intravenous  Once 11/08/16 2042 11/08/16 2229        Objective:     Vitals:   11/22/16 1800 11/22/16 1815 11/22/16 1830 11/22/16 1845  BP: 118/85 112/79 111/87 117/82  Pulse: (!) 120 (!) 120 (!) 120 (!) 120  Resp: 12 15 14 15   Temp:      TempSrc:      SpO2: 95% 94% 96% 98%  Weight:      Height:        Wt Readings from Last 3 Encounters:  11/17/16 93 kg (205 lb)  08/06/14 (!) 157.3 kg (346 lb 12.8 oz)  08/02/14 (!) 157.6 kg (347 lb 8 oz)     Intake/Output Summary (Last 24 hours) at 11/22/16 1857 Last data filed at 11/22/16 1830  Gross per 24 hour  Intake             2410 ml  Output  3300 ml  Net             -890 ml     Physical Exam  Gen:- Alert and comfortable.  HEENT:- [place].AT, No sclera icterus Neck-Supple Neck,No JVD,.  Lungs-  CTAB ,  CV- S1, S2 normal, irregular Abd-  +ve B.Sounds, Abd Soft, Mild lower abdominal tenderness,   Extremity/Skin:- No  edema,       Data Review:   Micro Results Recent Results (from the past 240 hour(s))  Surgical pcr screen     Status: Abnormal   Collection Time: 11/19/16  7:41 AM  Result Value Ref Range Status   MRSA, PCR POSITIVE (A) NEGATIVE Final    Comment: RESULT CALLED TO, READ BACK BY AND VERIFIED WITH: PICKETT,S. RN @1111  ON 12.8.17 BY NMCCOY    Staphylococcus aureus POSITIVE (A) NEGATIVE Final    Comment:        The Xpert SA Assay (FDA approved for NASAL specimens in patients over 66 years of age), is one component of a comprehensive surveillance program.  Test performance has been validated by [facility] for patients greater than or equal to 62 year old. It is not intended to diagnose infection nor to guide or monitor treatment.     Radiology Reports Dg Abdomen 1 View  Result Date: 11/08/2016 CLINICAL DATA:  Constipation over the last month. EXAM: ABDOMEN - 1 VIEW COMPARISON:  None. FINDINGS: Gas pattern does not suggest ileus or obstruction. Amount of fecal matter in the colon is within normal limits with the exception of slightly prominent stool in the  rectal vault. No significant soft tissue calcifications. Some vascular calcification noted. Chronic degenerative changes affect the spine. IMPRESSION: No markedly abnormal findings. Overall amount of fecal matter appears within normal limits. Slightly prominent stool in the rectal vault. Electronically Signed   By: Nelson Chimes M.D.   On: 11/08/2016 14:29   Ct Head Wo Contrast  Result Date: 11/08/2016 CLINICAL DATA:  Generalized weakness. Poor historian. Difficulty raising right arm. EXAM: CT HEAD WITHOUT CONTRAST TECHNIQUE: Contiguous axial images were obtained from the base of the skull through the vertex without intravenous contrast. COMPARISON:  None. FINDINGS: Brain: Ventricles and cisterns are within normal. There is minimal prominence of the CSF spaces compatible with age related atrophy. There is no mass, mass effect, shift of midline structures or acute hemorrhage. No evidence of acute infarction. Vascular: Minimal calcified plaque over the cavernous segment of the internal carotid arteries. Skull: Within normal. Sinuses/Orbits: Within normal. IMPRESSION: No acute intracranial findings. Electronically Signed   By: Marin Olp M.D.   On: 11/08/2016 15:56   Ct Abdomen Pelvis W Contrast  Addendum Date: 11/15/2016   ADDENDUM REPORT: 11/15/2016 08:56 ADDENDUM: Case reviewed prior to barium enema. There is sessile and lobulated mural filling defect in the cecum on the right lateral wall measuring 3 cm in diameter by 12 mm in thickness. While adherent stool can give this appearance, finding is more concerning for a mass. Case discussed with Dr. Fuller Plan via telephone at time of addendum. Patient is under consideration for colonoscopy. Electronically Signed   By: Monte Fantasia M.D.   On: 11/15/2016 08:56   Result Date: 11/15/2016 CLINICAL DATA:  Initial evaluation for weakness, weight loss, constipation. EXAM: CT ABDOMEN AND PELVIS WITH CONTRAST TECHNIQUE: Multidetector CT imaging of the abdomen and  pelvis was performed using the standard protocol following bolus administration of intravenous contrast. CONTRAST:  178mL ISOVUE-300 IOPAMIDOL (ISOVUE-300) INJECTION 61% COMPARISON:  Prior radiograph from  earlier the same day. FINDINGS: Lower chest: Mild scattered atelectatic changes present within the visualized lung bases. Visualized lungs are otherwise clear. No pleural or pericardial fusion. Hepatobiliary: Liver demonstrates a normal contrast enhanced appearance. Gallbladder within normal limits. No biliary dilatation. Pancreas: Pancreas within normal limits. Spleen: Scattered hypodense lesions noted within the spleen, largest of which measures 9 mm. These are indeterminate, but are of doubtful clinical significance. Spleen otherwise unremarkable. Adrenals/Urinary Tract: 2.3 cm hypodense nodule noted within the right adrenal gland, most likely a small adenoma. Left adrenal gland within normal limits. Kidneys equal in size with symmetric enhancement. Scattered parapelvic cyst noted within the left kidney. Few scattered calcifications within the left renal hilum suspicious for small nonobstructive calculi. These measure up to 3 mm. No hydronephrosis. No focal enhancing renal mass. Scattered cortical thinning/scarring noted. Ureters are of normal caliber without acute abnormality. Bladder within normal limits. Stomach/Bowel: Small hiatal hernia noted. Stomach otherwise unremarkable. Small bowel within normal limits for caliber without evidence for obstruction. Colonic diverticulosis without evidence for acute diverticulitis. Appendix not visualize, compatible with history of prior appendectomy. No abnormal wall thickening, mucosal enhancement, or inflammatory fat stranding seen about the bowels. Large amount of retained stool within the rectal vault, suggesting constipation. Vascular/Lymphatic: Normal intravascular enhancement seen throughout the intra-abdominal aorta and its branch vessels. Moderate aorto bi-iliac  atherosclerotic disease. No aneurysm. No pathologically enlarged intra-abdominal or pelvic lymph nodes. Reproductive: Prostate within normal limits. Other: No free air or fluid. Small bilateral fat containing inguinal hernias noted. Musculoskeletal: No acute osseous abnormality. No worrisome lytic or blastic osseous lesions. Moderate multilevel degenerative spondylolysis noted within the visualized spine. IMPRESSION: 1. No CT evidence for acute intra-abdominal or pelvic process. 2. Large amount of retained stool within the rectal vault, suggesting constipation. 3. Sigmoid diverticulosis without evidence for acute diverticulitis. 4. Question punctate nonobstructive left renal nephrolithiasis. Electronically Signed: By: Jeannine Boga M.D. On: 11/08/2016 18:08   Dg Abd 2 Views  Result Date: 11/10/2016 CLINICAL DATA:  Acute generalized abdominal pain. EXAM: ABDOMEN - 2 VIEW COMPARISON:  CT scan of November 08, 2016. FINDINGS: The bowel gas pattern is normal. There is no evidence of free air. No radio-opaque calculi or other significant radiographic abnormality is seen. Stool is noted in the rectum. IMPRESSION: No definite evidence of bowel obstruction or ileus. Electronically Signed   By: Marijo Conception, M.D.   On: 11/10/2016 15:53   Dg Colon Therapeutic W/cm  Result Date: 11/12/2016 CLINICAL DATA:  Severe constipation. EXAM: WATER SOLUBLE CONTRAST ENEMA TECHNIQUE: Approximately 3 L of warm water mixed with 480 cc of Gastrografin was instilled per rectum. FLUOROSCOPY TIME:  Radiation Exposure Index (if provided by the fluoroscopic device): 183.47 mGy COMPARISON:  Radiographs dated 11/10/2016 FINDINGS: EKG knee demonstrates moderate stool in the ascending colon as well as in the rectum. The study shows a 6 mm stool ball in the rectum. There is also stool throughout the remainder of the colon particularly in the cecum. There are few diverticula in the sigmoid portion the colon. The study was suboptimal  due to patient's inability to move well during the exam. Multiple smaller stool balls throughout the colon. I was able to get some contrast into the ascending colon. There are no discrete strictures or definitive mass lesions. The patient was unable to sit up to evacuate the contrast at the end of the exam. IMPRESSION: Extensive stool in the distal colon as well as in the ascending colon. No dilatation of the colon. Scattered diverticula  in the sigmoid portion of the colon. No discrete obstruction or mass lesion. However, the study is suboptimal for detail because of the patient's inability to retain the contrast or move well during the exam. Electronically Signed   By: Lorriane Shire M.D.   On: 11/12/2016 12:46     CBC  Recent Labs Lab 11/16/16 0825 11/18/16 0352 11/22/16 0450  WBC 4.7 5.0 4.5  HGB 9.8* 8.9* 9.5*  HCT 28.8* 26.9* 28.2*  PLT 156 177 221  MCV 99.0 101.5* 98.3  MCH 33.7 33.6 33.1  MCHC 34.0 33.1 33.7  RDW 15.4 16.2* 16.6*    Chemistries   Recent Labs Lab 11/16/16 0825 11/18/16 0352 11/22/16 0450  NA 138 136 137  K 3.8 3.6 4.0  CL 104 105 104  CO2 28 25 26   GLUCOSE 97 135* 103*  BUN 5* 6 8  CREATININE 0.53* 0.58* 0.58*  CALCIUM 7.8* 7.5* 7.9*   ------------------------------------------------------------------------------------------------------------------ No results for input(s): CHOL, HDL, LDLCALC, TRIG, CHOLHDL, LDLDIRECT in the last 72 hours.  Lab Results  Component Value Date   HGBA1C 5.2 11/09/2016   ------------------------------------------------------------------------------------------------------------------ No results for input(s): TSH, T4TOTAL, T3FREE, THYROIDAB in the last 72 hours.  Invalid input(s): FREET3 ------------------------------------------------------------------------------------------------------------------ No results for input(s): VITAMINB12, FOLATE, FERRITIN, TIBC, IRON, RETICCTPCT in the last 72 hours.  Coagulation  profile No results for input(s): INR, PROTIME in the last 168 hours.  No results for input(s): DDIMER in the last 72 hours.  Cardiac Enzymes No results for input(s): CKMB, TROPONINI, MYOGLOBIN in the last 168 hours.  Invalid input(s): CK ------------------------------------------------------------------------------------------------------------------    Component Value Date/Time   BNP 94.3 08/15/2013 1834     Jaki Steptoe M.D on 11/22/2016 at 6:57 PM  Between 7am to 7pm - Pager - [phone]  After 7pm go to [email or web address] - password [facility]  [facility] -  Office  [phone]

## 2016-11-22 NOTE — Progress Notes (Signed)
Patient seen and chart reviewed.  Ready for OR.

## 2016-11-22 NOTE — Anesthesia Preprocedure Evaluation (Addendum)
Anesthesia Evaluation  Patient identified by MRN, date of birth, ID band Patient awake    Reviewed: Allergy & Precautions, H&P , NPO status , Patient's Chart, lab work & pertinent test results  Airway Mallampati: III  TM Distance: >3 FB Neck ROM: Full    Dental no notable dental hx. (+) Edentulous Upper, Edentulous Lower, Dental Advisory Given   Pulmonary neg pulmonary ROS, former smoker,    Pulmonary exam normal breath sounds clear to auscultation       Cardiovascular hypertension, + dysrhythmias Atrial Fibrillation  Rhythm:Irregular Rate:Normal     Neuro/Psych negative neurological ROS  negative psych ROS   GI/Hepatic negative GI ROS, Neg liver ROS,   Endo/Other  diabetes  Renal/GU negative Renal ROS  negative genitourinary   Musculoskeletal  (+) Arthritis , Osteoarthritis,    Abdominal   Peds  Hematology negative hematology ROS (+)   Anesthesia Other Findings   Reproductive/Obstetrics negative OB ROS                           Anesthesia Physical Anesthesia Plan  ASA: III  Anesthesia Plan: General   Post-op Pain Management:    Induction: Intravenous  Airway Management Planned: Oral ETT  Additional Equipment:   Intra-op Plan:   Post-operative Plan: Extubation in OR  Informed Consent: I have reviewed the patients History and Physical, chart, labs and discussed the procedure including the risks, benefits and alternatives for the proposed anesthesia with the patient or authorized representative who has indicated his/her understanding and acceptance.   Dental advisory given  Plan Discussed with: CRNA  Anesthesia Plan Comments:         Anesthesia Quick Evaluation

## 2016-11-22 NOTE — Anesthesia Postprocedure Evaluation (Signed)
Anesthesia Post Note  Patient: [name]  Procedure(s) Performed: Procedure(s) (LRB): HAND ASSISTED LAPAROSCOPIC COLON RESECTION (N/A)  Patient location during evaluation: PACU Anesthesia Type: General Level of consciousness: awake and alert Pain management: pain level controlled Vital Signs Assessment: post-procedure vital signs reviewed and stable Respiratory status: spontaneous breathing, nonlabored ventilation and respiratory function stable Cardiovascular status: blood pressure returned to baseline and stable Postop Assessment: no signs of nausea or vomiting Anesthetic complications: no    Last Vitals:  Vitals:   11/22/16 1921 11/22/16 1922  BP: 107/80 113/90  Pulse: (!) 120 87  Resp: (!) 31 10  Temp:      Last Pain:  Vitals:   11/22/16 1915  TempSrc:   PainSc: Asleep                 Cypress Fanfan,W. EDMOND

## 2016-11-23 ENCOUNTER — Encounter (HOSPITAL_COMMUNITY): Payer: Self-pay | Admitting: General Surgery

## 2016-11-23 DIAGNOSIS — N179 Acute kidney failure, unspecified: Secondary | ICD-10-CM | POA: Diagnosis not present

## 2016-11-23 DIAGNOSIS — I482 Chronic atrial fibrillation: Secondary | ICD-10-CM | POA: Diagnosis not present

## 2016-11-23 DIAGNOSIS — R1084 Generalized abdominal pain: Secondary | ICD-10-CM | POA: Diagnosis not present

## 2016-11-23 DIAGNOSIS — E1169 Type 2 diabetes mellitus with other specified complication: Secondary | ICD-10-CM | POA: Diagnosis not present

## 2016-11-23 DIAGNOSIS — R933 Abnormal findings on diagnostic imaging of other parts of digestive tract: Secondary | ICD-10-CM | POA: Diagnosis not present

## 2016-11-23 DIAGNOSIS — I4891 Unspecified atrial fibrillation: Secondary | ICD-10-CM | POA: Diagnosis not present

## 2016-11-23 DIAGNOSIS — I10 Essential (primary) hypertension: Secondary | ICD-10-CM | POA: Diagnosis not present

## 2016-11-23 DIAGNOSIS — K5901 Slow transit constipation: Secondary | ICD-10-CM | POA: Diagnosis not present

## 2016-11-23 DIAGNOSIS — D120 Benign neoplasm of cecum: Secondary | ICD-10-CM | POA: Diagnosis not present

## 2016-11-23 DIAGNOSIS — N183 Chronic kidney disease, stage 3 (moderate): Secondary | ICD-10-CM | POA: Diagnosis not present

## 2016-11-23 LAB — BASIC METABOLIC PANEL
ANION GAP: 6 (ref 5–15)
BUN: 9 mg/dL (ref 6–20)
CO2: 26 mmol/L (ref 22–32)
Calcium: 7.5 mg/dL — ABNORMAL LOW (ref 8.9–10.3)
Chloride: 105 mmol/L (ref 101–111)
Creatinine, Ser: 0.45 mg/dL — ABNORMAL LOW (ref 0.61–1.24)
GFR calc Af Amer: >60 mL/min (ref >60)
GLUCOSE: 105 mg/dL — AB (ref 65–99)
POTASSIUM: 4.3 mmol/L (ref 3.5–5.1)
Sodium: 137 mmol/L (ref 135–145)

## 2016-11-23 LAB — GLUCOSE, CAPILLARY
GLUCOSE-CAPILLARY: 102 mg/dL — AB (ref 65–99)
Glucose-Capillary: 115 mg/dL — ABNORMAL HIGH (ref 65–99)
Glucose-Capillary: 99 mg/dL (ref 65–99)
Glucose-Capillary: 95 mg/dL (ref 65–99)

## 2016-11-23 LAB — CBC
HEMATOCRIT: 30.5 % — AB (ref 39.0–52.0)
HEMOGLOBIN: 10.2 g/dL — AB (ref 13.0–17.0)
MCH: 34.1 pg — ABNORMAL HIGH (ref 26.0–34.0)
MCHC: 33.4 g/dL (ref 30.0–36.0)
MCV: 102 fL — AB (ref 78.0–100.0)
PLATELETS: 247 10*3/uL (ref 150–400)
RBC: 2.99 MIL/uL — AB (ref 4.22–5.81)
RDW: 16.9 % — ABNORMAL HIGH (ref 11.5–15.5)
WBC: 5.7 10*3/uL (ref 4.0–10.5)

## 2016-11-23 NOTE — Progress Notes (Signed)
Patient Demographics:    [name], is a 65 y.o. male, DOB - August 18, 1951, [id number]  Admit date - 11/08/2016   Admitting Physician Karmen Bongo, MD  Outpatient Primary MD for the patient is Sandi Mariscal, MD  LOS - 15   Chief Complaint  Patient presents with  . Constipation  . Weakness        Subjective:    [name] is anxious, concerned about abdominal pain.       Principal Problem:   Constipation Active Problems:   Essential hypertension, benign   Atrial fibrillation (HCC)   Diabetes mellitus type 2 in obese (HCC)   Hyponatremia   Urinary tract infection without hematuria   Hypokalemia   CKD (chronic kidney disease) stage 3, GFR 30-59 ml/min   Hyperbilirubinemia   Homelessness   Protein-calorie malnutrition, severe   Impaction of colon (HCC)   Abdominal pain   Generalized weakness   Fecal impaction (HCC)   AKI (acute kidney injury) (HCC)   Abnormal CT scan, colon   Benign neoplasm of cecum   Polyp of cecum  Brief Narrative:  65 y.o.malewith medical history significant ofA. Fib not on anticoagulation, DM Type 2, HTN, HLD, depression who presents initially with complaints of uncontrolled constipation. Patient admitted to being essentially noncompliant with any of his regimen for the past several years. In the emergency department, patient was noted to have symptomatically hyponatremia and tachycardia. Patient was also mildly hypotensive, receiving IV fluid hydration. Patient also found to have significant stool burden on CT abdomen pelvis. Fecal dis impation attempt was without success. Smog enema yielded limited success. The patient was ultimately admitted for further management of multiple medical issues. Underwent colonoscopy on 12/3 shows a polyp in the cecum, internal hemorrhoids, , ulcers in the rectosigmoid colon, consistent with stercoral ulcers. Moderate diverticulosis  in the sigmoid colon.    1)Possible Cecal Mass/Fecal Impaction/Constipation- underwent Colonoscopy on 11/17/2016, constipation/Fecal impaction appears to have Resolved. 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 Rectal ulcer shows erosive ulcer and reactive changes.  Recommended miralax 2 to 3 times daily for constipation and repeat colonoscopy in 2 years for polyp surveillance with Dr Henrene Pastor.  Lap assisted partial colectomy underwent on 12/11. Post op he developed a fib with RVR , was given a dose of cardizem and his rate is between is between 90 to 110/min.   2) A. fib with RVR- CHADVASC2 score is 2, refuses anticoagulation, history of noncompliance, continue metoprolol and digoxin as per cardiology,   Echocardiogram was not optimal as cardiac structures were not visualized at all. Recommended TEE to assess the cardiac struction if clinically indicated.  Currently rate controlled.   3)H/o Glucose intolerance-most recent A1c is 5.2, avoid overaggressive control in order to avoid life-threatening hypoglycemia.  CBG (last 3)   Recent Labs  11/22/16 2231 11/23/16 0804 11/23/16 1216  GLUCAP 178* 115* 99      4) BPH with urinary retention- currently has Foley catheter,  (patient had in and out cath couple of times prior), continue Flomax, . No new complaints.   5)AKI- resolved, creatinine is back to 0.5, most likely was due to BPH with urinary retention and dehydration in the setting of  possible UTI. Urine culture was negative, patient completed antibiotics.   6)Severe Protein Caloric malnutrition- continue  Nutritional supplements  6) anemia; macrocytic,. Hemoglobin is 10.2.  Monitor.   7)Social/Dispo- patient with deconditioning and debility, patient is essentially homeless, Education officer, museum consult for disposition   Consults  :  Gastroenterology  Surgery.    DVT Prophylaxis  :   SCDs /Lovenox  Lab Results    Component Value Date   PLT 247 11/23/2016    Inpatient Medications  Scheduled Meds: . alvimopan  12 mg Oral BID  . busPIRone  5 mg Oral TID  . digoxin  0.125 mg Oral Daily  . hydrocortisone   Rectal BID  . insulin aspart  0-15 Units Subcutaneous TID WC  . metoprolol tartrate  50 mg Oral BID  . morphine   Intravenous Q4H  . mupirocin ointment  1 application Nasal BID  . sodium chloride flush  3 mL Intravenous Q12H  . tamsulosin  0.4 mg Oral Daily   Continuous Infusions: . 0.9 % NaCl with KCl 20 mEq / L 100 mL/hr at 11/23/16 0806   PRN Meds:.acetaminophen **OR** [DISCONTINUED] acetaminophen, diphenhydrAMINE **OR** diphenhydrAMINE, lidocaine, liver oil-zinc oxide, LORazepam, naloxone **AND** sodium chloride flush, ondansetron (ZOFRAN) IV, ondansetron **OR** [DISCONTINUED] ondansetron (ZOFRAN) IV    Anti-infectives    Start     Dose/Rate Route Frequency Ordered Stop   11/19/16 0600  cefoTEtan (CEFOTAN) 2 g in dextrose 5 % 50 mL IVPB     2 g 100 mL/hr over 30 Minutes Intravenous On call to O.R. 11/18/16 1645 11/20/16 0559   11/10/16 2000  cefTRIAXone (ROCEPHIN) 1 g in dextrose 5 % 50 mL IVPB     1 g 100 mL/hr over 30 Minutes Intravenous Every 24 hours 11/10/16 1847 11/14/16 2236   11/09/16 2000  cefTRIAXone (ROCEPHIN) 2 g in dextrose 5 % 50 mL IVPB  Status:  Discontinued     2 g 100 mL/hr over 30 Minutes Intravenous Every 24 hours 11/09/16 0312 11/10/16 1847   11/08/16 2045  cefTRIAXone (ROCEPHIN) 2 g in dextrose 5 % 50 mL IVPB     2 g 100 mL/hr over 30 Minutes Intravenous  Once 11/08/16 2042 11/08/16 2229        Objective:   Vitals:   11/23/16 0613 11/23/16 0809 11/23/16 1040 11/23/16 1158  BP: 131/71  120/77   Pulse: 88  (!) 123   Resp: 16 18 16 18   Temp: 98.4 F (36.9 C)  97.7 F (36.5 C)   TempSrc: Oral  Oral   SpO2: 97% 98% 96% 97%  Weight:      Height:        Wt Readings from Last 3 Encounters:  11/17/16 93 kg (205 lb)  08/06/14 (!) 157.3 kg (346 lb 12.8  oz)  08/02/14 (!) 157.6 kg (347 lb 8 oz)     Intake/Output Summary (Last 24 hours) at 11/23/16 1317 Last data filed at 11/23/16 1044  Gross per 24 hour  Intake             1950 ml  Output             2550 ml  Net             -600 ml     Physical Exam  Gen:- Alert , anxious.  HEENT:- [place].AT, No sclera icterus Neck-Supple Neck,No JVD,.  Lungs-  CTAB ,  CV- S1, S2 normal, irregular Abd-  Hypoactive bowel sounds, tender generalized.  Extremity/Skin:-  No  edema,       Data Review:   Micro Results Recent Results (from the past 240 hour(s))  Surgical pcr screen     Status: Abnormal   Collection Time: 11/19/16  7:41 AM  Result Value Ref Range Status   MRSA, PCR POSITIVE (A) NEGATIVE Final    Comment: RESULT CALLED TO, READ BACK BY AND VERIFIED WITH: PICKETT,S. RN @1111  ON 12.8.17 BY NMCCOY    Staphylococcus aureus POSITIVE (A) NEGATIVE Final    Comment:        The Xpert SA Assay (FDA approved for NASAL specimens in patients over 54 years of age), is one component of a comprehensive surveillance program.  Test performance has been validated by [facility] for patients greater than or equal to 67 year old. It is not intended to diagnose infection nor to guide or monitor treatment.     Radiology Reports Dg Abdomen 1 View  Result Date: 11/08/2016 CLINICAL DATA:  Constipation over the last month. EXAM: ABDOMEN - 1 VIEW COMPARISON:  None. FINDINGS: Gas pattern does not suggest ileus or obstruction. Amount of fecal matter in the colon is within normal limits with the exception of slightly prominent stool in the rectal vault. No significant soft tissue calcifications. Some vascular calcification noted. Chronic degenerative changes affect the spine. IMPRESSION: No markedly abnormal findings. Overall amount of fecal matter appears within normal limits. Slightly prominent stool in the rectal vault. Electronically Signed   By: Nelson Chimes M.D.   On: 11/08/2016 14:29   Ct Head Wo  Contrast  Result Date: 11/08/2016 CLINICAL DATA:  Generalized weakness. Poor historian. Difficulty raising right arm. EXAM: CT HEAD WITHOUT CONTRAST TECHNIQUE: Contiguous axial images were obtained from the base of the skull through the vertex without intravenous contrast. COMPARISON:  None. FINDINGS: Brain: Ventricles and cisterns are within normal. There is minimal prominence of the CSF spaces compatible with age related atrophy. There is no mass, mass effect, shift of midline structures or acute hemorrhage. No evidence of acute infarction. Vascular: Minimal calcified plaque over the cavernous segment of the internal carotid arteries. Skull: Within normal. Sinuses/Orbits: Within normal. IMPRESSION: No acute intracranial findings. Electronically Signed   By: Marin Olp M.D.   On: 11/08/2016 15:56   Ct Abdomen Pelvis W Contrast  Addendum Date: 11/15/2016   ADDENDUM REPORT: 11/15/2016 08:56 ADDENDUM: Case reviewed prior to barium enema. There is sessile and lobulated mural filling defect in the cecum on the right lateral wall measuring 3 cm in diameter by 12 mm in thickness. While adherent stool can give this appearance, finding is more concerning for a mass. Case discussed with Dr. Fuller Plan via telephone at time of addendum. Patient is under consideration for colonoscopy. Electronically Signed   By: Monte Fantasia M.D.   On: 11/15/2016 08:56   Result Date: 11/15/2016 CLINICAL DATA:  Initial evaluation for weakness, weight loss, constipation. EXAM: CT ABDOMEN AND PELVIS WITH CONTRAST TECHNIQUE: Multidetector CT imaging of the abdomen and pelvis was performed using the standard protocol following bolus administration of intravenous contrast. CONTRAST:  169mL ISOVUE-300 IOPAMIDOL (ISOVUE-300) INJECTION 61% COMPARISON:  Prior radiograph from earlier the same day. FINDINGS: Lower chest: Mild scattered atelectatic changes present within the visualized lung bases. Visualized lungs are otherwise clear. No pleural  or pericardial fusion. Hepatobiliary: Liver demonstrates a normal contrast enhanced appearance. Gallbladder within normal limits. No biliary dilatation. Pancreas: Pancreas within normal limits. Spleen: Scattered hypodense lesions noted within the spleen, largest of which measures 9 mm. These  are indeterminate, but are of doubtful clinical significance. Spleen otherwise unremarkable. Adrenals/Urinary Tract: 2.3 cm hypodense nodule noted within the right adrenal gland, most likely a small adenoma. Left adrenal gland within normal limits. Kidneys equal in size with symmetric enhancement. Scattered parapelvic cyst noted within the left kidney. Few scattered calcifications within the left renal hilum suspicious for small nonobstructive calculi. These measure up to 3 mm. No hydronephrosis. No focal enhancing renal mass. Scattered cortical thinning/scarring noted. Ureters are of normal caliber without acute abnormality. Bladder within normal limits. Stomach/Bowel: Small hiatal hernia noted. Stomach otherwise unremarkable. Small bowel within normal limits for caliber without evidence for obstruction. Colonic diverticulosis without evidence for acute diverticulitis. Appendix not visualize, compatible with history of prior appendectomy. No abnormal wall thickening, mucosal enhancement, or inflammatory fat stranding seen about the bowels. Large amount of retained stool within the rectal vault, suggesting constipation. Vascular/Lymphatic: Normal intravascular enhancement seen throughout the intra-abdominal aorta and its branch vessels. Moderate aorto bi-iliac atherosclerotic disease. No aneurysm. No pathologically enlarged intra-abdominal or pelvic lymph nodes. Reproductive: Prostate within normal limits. Other: No free air or fluid. Small bilateral fat containing inguinal hernias noted. Musculoskeletal: No acute osseous abnormality. No worrisome lytic or blastic osseous lesions. Moderate multilevel degenerative spondylolysis  noted within the visualized spine. IMPRESSION: 1. No CT evidence for acute intra-abdominal or pelvic process. 2. Large amount of retained stool within the rectal vault, suggesting constipation. 3. Sigmoid diverticulosis without evidence for acute diverticulitis. 4. Question punctate nonobstructive left renal nephrolithiasis. Electronically Signed: By: Jeannine Boga M.D. On: 11/08/2016 18:08   Dg Abd 2 Views  Result Date: 11/10/2016 CLINICAL DATA:  Acute generalized abdominal pain. EXAM: ABDOMEN - 2 VIEW COMPARISON:  CT scan of November 08, 2016. FINDINGS: The bowel gas pattern is normal. There is no evidence of free air. No radio-opaque calculi or other significant radiographic abnormality is seen. Stool is noted in the rectum. IMPRESSION: No definite evidence of bowel obstruction or ileus. Electronically Signed   By: Marijo Conception, M.D.   On: 11/10/2016 15:53   Dg Colon Therapeutic W/cm  Result Date: 11/12/2016 CLINICAL DATA:  Severe constipation. EXAM: WATER SOLUBLE CONTRAST ENEMA TECHNIQUE: Approximately 3 L of warm water mixed with 480 cc of Gastrografin was instilled per rectum. FLUOROSCOPY TIME:  Radiation Exposure Index (if provided by the fluoroscopic device): 183.47 mGy COMPARISON:  Radiographs dated 11/10/2016 FINDINGS: EKG knee demonstrates moderate stool in the ascending colon as well as in the rectum. The study shows a 6 mm stool ball in the rectum. There is also stool throughout the remainder of the colon particularly in the cecum. There are few diverticula in the sigmoid portion the colon. The study was suboptimal due to patient's inability to move well during the exam. Multiple smaller stool balls throughout the colon. I was able to get some contrast into the ascending colon. There are no discrete strictures or definitive mass lesions. The patient was unable to sit up to evacuate the contrast at the end of the exam. IMPRESSION: Extensive stool in the distal colon as well as in the  ascending colon. No dilatation of the colon. Scattered diverticula in the sigmoid portion of the colon. No discrete obstruction or mass lesion. However, the study is suboptimal for detail because of the patient's inability to retain the contrast or move well during the exam. Electronically Signed   By: Lorriane Shire M.D.   On: 11/12/2016 12:46     CBC  Recent Labs Lab 11/18/16 0352 11/22/16 0450  11/23/16 1133  WBC 5.0 4.5 5.7  HGB 8.9* 9.5* 10.2*  HCT 26.9* 28.2* 30.5*  PLT 177 221 247  MCV 101.5* 98.3 102.0*  MCH 33.6 33.1 34.1*  MCHC 33.1 33.7 33.4  RDW 16.2* 16.6* 16.9*    Chemistries   Recent Labs Lab 11/18/16 0352 11/22/16 0450 11/23/16 1133  NA 136 137 137  K 3.6 4.0 4.3  CL 105 104 105  CO2 25 26 26   GLUCOSE 135* 103* 105*  BUN 6 8 9   CREATININE 0.58* 0.58* 0.45*  CALCIUM 7.5* 7.9* 7.5*   ------------------------------------------------------------------------------------------------------------------ No results for input(s): CHOL, HDL, LDLCALC, TRIG, CHOLHDL, LDLDIRECT in the last 72 hours.  Lab Results  Component Value Date   HGBA1C 5.2 11/09/2016   ------------------------------------------------------------------------------------------------------------------ No results for input(s): TSH, T4TOTAL, T3FREE, THYROIDAB in the last 72 hours.  Invalid input(s): FREET3 ------------------------------------------------------------------------------------------------------------------ No results for input(s): VITAMINB12, FOLATE, FERRITIN, TIBC, IRON, RETICCTPCT in the last 72 hours.  Coagulation profile No results for input(s): INR, PROTIME in the last 168 hours.  No results for input(s): DDIMER in the last 72 hours.  Cardiac Enzymes No results for input(s): CKMB, TROPONINI, MYOGLOBIN in the last 168 hours.  Invalid input(s): CK ------------------------------------------------------------------------------------------------------------------      Component Value Date/Time   BNP 94.3 08/15/2013 1834     Joia Doyle M.D on 11/23/2016 at 1:17 PM  Between 7am to 7pm - Pager - [phone]  After 7pm go to [email or web address] - password [facility]  [facility] -  Office  [phone]

## 2016-11-23 NOTE — Care Management Important Message (Signed)
Important Message  Patient Details IM Letter given to Cookie/Case Manager to present to Patient Name: [name] MRN: [id number] Date of Birth: 1951/04/12   Medicare Important Message Given:  Yes    Camillo Flaming 11/23/2016, 11:20 AMImportant Message  Patient Details  Name: [name] MRN: [id number] Date of Birth: 1951/07/13   Medicare Important Message Given:  Yes    Camillo Flaming 11/23/2016, 11:20 AM

## 2016-11-23 NOTE — Progress Notes (Signed)
CSW following for discharge planning - patient is still considering going to SNF when medically ready. CSW has completed FL2 & will continue to follow and assist with discharge when ready.    Raynaldo Opitz, [facility] Clinical Social Worker cell #: [phone]

## 2016-11-23 NOTE — Progress Notes (Signed)
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  Clear liquids.   LOS: 15 days     1 Day Post-Op  Subjective: Fair pain control.  No nausea.  We discussed the operation.  Objective: Vital signs in last 24 hours: Temp:  [97.5 F (36.4 C)-98.5 F (36.9 C)] 97.7 F (36.5 C) (12/12 1040) Pulse Rate:  [40-123] 123 (12/12 1040) Resp:  [6-31] 16 (12/12 1040) BP: (107-149)/(61-97) 120/77 (12/12 1040) SpO2:  [93 %-100 %] 96 % (12/12 1040) Last BM Date: 11/22/16  Intake/Output from previous day: 12/11 0701 - 12/12 0700 In: 1950 [I.V.:1900; IV Piggyback:50] Out: 2000 [Urine:1750; Blood:250] Intake/Output this shift: Total I/O In: 0  Out: 550 [Urine:550]  PE: General- In NAD Abdomen-soft, dressings dry, hypoactive bowel sounds.  Lab Results:   Recent Labs  11/22/16 0450  WBC 4.5  HGB 9.5*  HCT 28.2*  PLT 221   BMET  Recent Labs  11/22/16 0450  NA 137  K 4.0  CL 104  CO2 26  GLUCOSE 103*  BUN 8  CREATININE 0.58*  CALCIUM 7.9*   PT/INR No results for input(s): LABPROT, INR in the last 72 hours. Comprehensive Metabolic Panel:    Component Value Date/Time   NA 137 11/22/2016 0450   NA 136 11/18/2016 0352   K 4.0 11/22/2016 0450   K 3.6 11/18/2016 0352   CL 104 11/22/2016 0450   CL 105 11/18/2016 0352   CO2 26 11/22/2016 0450   CO2 25 11/18/2016 0352   BUN 8 11/22/2016 0450   BUN 6 11/18/2016 0352   CREATININE 0.58 (L) 11/22/2016 0450   CREATININE 0.58 (L) 11/18/2016 0352   CREATININE 1.10 08/02/2014 1505   CREATININE 1.08 04/26/2014 0830   GLUCOSE 103 (H) 11/22/2016 0450   GLUCOSE 135 (H) 11/18/2016 0352   CALCIUM 7.9 (L) 11/22/2016 0450   CALCIUM 7.5 (L) 11/18/2016 0352   AST 11 (L) 11/12/2016 0430   AST 17 11/08/2016 1411   ALT 7 (L) 11/12/2016 0430   ALT 11 (L) 11/08/2016 1411   ALKPHOS 48 11/12/2016  0430   ALKPHOS 76 11/08/2016 1411   BILITOT 0.6 11/12/2016 0430   BILITOT 2.1 (H) 11/08/2016 1411   PROT 4.7 (L) 11/12/2016 0430   PROT 7.7 11/08/2016 1411   ALBUMIN 2.3 (L) 11/12/2016 0430   ALBUMIN 4.2 11/08/2016 1411     Studies/Results: No results found.  Anti-infectives: Anti-infectives    Start     Dose/Rate Route Frequency Ordered Stop   11/19/16 0600  cefoTEtan (CEFOTAN) 2 g in dextrose 5 % 50 mL IVPB     2 g 100 mL/hr over 30 Minutes Intravenous On call to O.R. 11/18/16 1645 11/20/16 0559   11/10/16 2000  cefTRIAXone (ROCEPHIN) 1 g in dextrose 5 % 50 mL IVPB     1 g 100 mL/hr over 30 Minutes Intravenous Every 24 hours 11/10/16 1847 11/14/16 2236   11/09/16 2000  cefTRIAXone (ROCEPHIN) 2 g in dextrose 5 % 50 mL IVPB  Status:  Discontinued     2 g 100 mL/hr over 30 Minutes Intravenous Every 24 hours 11/09/16 0312 11/10/16 1847   11/08/16 2045  cefTRIAXone (ROCEPHIN) 2 g in dextrose 5 % 50 mL IVPB     2 g 100 mL/hr over 30 Minutes Intravenous  Once 11/08/16 2042 11/08/16 2229       [name] J 11/23/2016

## 2016-11-23 NOTE — Progress Notes (Signed)
PT Cancellation Note  Patient Details Name: [name] MRN: [id number] DOB: [birth year]   Cancelled Treatment:     POD # 1 LAP                                            Pt still in "alot" of pain and stated he was not able to attempt any OOB activity.  Will check back next day or 2 pending schedule.   Rica Koyanagi  PTA [facility]  Acute  Rehab Pager      [phone]

## 2016-11-24 DIAGNOSIS — I481 Persistent atrial fibrillation: Secondary | ICD-10-CM | POA: Diagnosis not present

## 2016-11-24 DIAGNOSIS — I482 Chronic atrial fibrillation: Secondary | ICD-10-CM | POA: Diagnosis not present

## 2016-11-24 DIAGNOSIS — K5901 Slow transit constipation: Secondary | ICD-10-CM | POA: Diagnosis not present

## 2016-11-24 DIAGNOSIS — K5900 Constipation, unspecified: Secondary | ICD-10-CM | POA: Diagnosis not present

## 2016-11-24 DIAGNOSIS — E1169 Type 2 diabetes mellitus with other specified complication: Secondary | ICD-10-CM | POA: Diagnosis not present

## 2016-11-24 DIAGNOSIS — E871 Hypo-osmolality and hyponatremia: Secondary | ICD-10-CM | POA: Diagnosis not present

## 2016-11-24 DIAGNOSIS — I480 Paroxysmal atrial fibrillation: Secondary | ICD-10-CM | POA: Diagnosis not present

## 2016-11-24 DIAGNOSIS — N183 Chronic kidney disease, stage 3 (moderate): Secondary | ICD-10-CM | POA: Diagnosis not present

## 2016-11-24 DIAGNOSIS — N179 Acute kidney failure, unspecified: Secondary | ICD-10-CM | POA: Diagnosis not present

## 2016-11-24 DIAGNOSIS — R109 Unspecified abdominal pain: Secondary | ICD-10-CM | POA: Diagnosis not present

## 2016-11-24 DIAGNOSIS — R1084 Generalized abdominal pain: Secondary | ICD-10-CM | POA: Diagnosis not present

## 2016-11-24 LAB — GLUCOSE, CAPILLARY
GLUCOSE-CAPILLARY: 117 mg/dL — AB (ref 65–99)
GLUCOSE-CAPILLARY: 110 mg/dL — AB (ref 65–99)
Glucose-Capillary: 116 mg/dL — ABNORMAL HIGH (ref 65–99)
Glucose-Capillary: 117 mg/dL — ABNORMAL HIGH (ref 65–99)

## 2016-11-24 MED ORDER — MORPHINE SULFATE (PF) 2 MG/ML IV SOLN
2.0000 mg | INTRAVENOUS | Status: DC | PRN
  Administered 2016-11-24 – 2016-11-25 (×7): 2 mg via INTRAVENOUS
  Filled 2016-11-24 (×7): qty 1

## 2016-11-24 MED ORDER — BOOST / RESOURCE BREEZE PO LIQD
1.0000 | Freq: Four times a day (QID) | ORAL | Status: DC
  Administered 2016-11-24 – 2016-11-25 (×2): 1 via ORAL

## 2016-11-24 MED ORDER — BOOST / RESOURCE BREEZE PO LIQD: 1.0000 | Freq: Three times a day (TID) | ORAL | Status: DC

## 2016-11-24 NOTE — Progress Notes (Signed)
Occupational Therapy Treatment Patient Details Name: Jahson Dilone MRN: [id number] DOB: 1951/07/18 Today's Date: 11/24/2016    History of present illness  Daiven Winterrowd is a 65 y.o. male with medical history significant of A. Fib not on anticoagulation, DM Type 2, HTN, HLD, depression - he is not taking medication for any of these problems and has not seen a physician in 2 years.  Patient with severe constipation, malnutrition, having trouble walking, having trouble keeping energy.  Right arm partially immobile - ?pinched nerve in neck for months.  Abnormal lesion in cecum on original CT.  S/p lap colon resection 12/11   OT comments  Assisted back to bed; mod +2 to stand from recliner.  Pt was not up to further activity  Follow Up Recommendations  SNF    Equipment Recommendations       Recommendations for Other Services      Precautions / Restrictions Precautions Precautions: Fall Precaution Comments: recent ABD surgery Restrictions Weight Bearing Restrictions: No       Mobility Bed Mobility Overal bed mobility: Needs Assistance Bed Mobility: Supine to Sit      Sit to supine: Mod assist;HOB elevated   General bed mobility comments: pt did not want to return to sidelying when getting back to bed.  Raised HOB to avoid abdominal strain  Transfers Overall transfer level: Needs assistance Equipment used: Rolling walker (2 wheeled) Transfers: Sit to/from [facility] Sit to Stand: Mod assist;+2 physical assistance Stand pivot transfers: Min assist       General transfer comment: mod +2 from recliner for sit to stand.  Cues for UE/LE placement.  Pt fearful of falling.  Extra time    Balance                                   ADL                                         General ADL Comments: performed SPT back to bed from chair with mod +2 assistance for sit to stand--steadying assistance of one, once he was on his feet.  Pt  didn't feel up to any further activity      Vision                     Perception     Praxis      Cognition   Behavior During Therapy: [facility] for tasks assessed/performed Overall Cognitive Status: Within Functional Limits for tasks assessed                       Extremity/Trunk Assessment               Exercises     Shoulder Instructions       General Comments      Pertinent Vitals/ Pain       Pain Assessment: Faces Faces Pain Scale: Hurts even more Pain Location: abdomen Pain Descriptors / Indicators: Grimacing;Moaning Pain Intervention(s): Limited activity within patient's tolerance;Monitored during session;Repositioned  Home Living                                          Prior Functioning/Environment  Frequency  Min 2X/week        Progress Toward Goals  OT Goals(current goals can now be found in the care plan section)  Progress towards OT goals: Progressing toward goals     Plan      Co-evaluation                 End of Session     Activity Tolerance Patient limited by fatigue;Patient limited by pain   Patient Left in bed;with bed alarm set;with nursing/sitter in room   Nurse Communication          Time: [id number] OT Time Calculation (min): 28 min  Charges: OT General Charges $OT Visit: 1 Procedure OT Treatments $Therapeutic Activity: 8-22 mins  Aldine Grainger 11/24/2016, 3:48 PM   Lesle Chris, OTR/L [phone] 11/24/2016

## 2016-11-24 NOTE — Progress Notes (Signed)
Patient Demographics:    [name], is a 65 y.o. male, DOB - March 02, 1951, [id number]  Admit date - 11/08/2016   Admitting Physician Karmen Bongo, MD  Outpatient Primary MD for the patient is Sandi Mariscal, MD  LOS - 28   Chief Complaint  Patient presents with  . Constipation  . Weakness        Subjective:    [name] is complaining of abd pain, increase weakness and debility. No CP or SOB.       Principal Problem:   Constipation Active Problems:   Essential hypertension, benign   Atrial fibrillation (HCC)   Diabetes mellitus type 2 in obese (HCC)   Hyponatremia   Urinary tract infection without hematuria   Hypokalemia   CKD (chronic kidney disease) stage 3, GFR 30-59 ml/min   Hyperbilirubinemia   Homelessness   Protein-calorie malnutrition, severe   Impaction of colon (HCC)   Abdominal pain   Generalized weakness   Fecal impaction (HCC)   AKI (acute kidney injury) (HCC)   Abnormal CT scan, colon   Benign neoplasm of cecum   Polyp of cecum  Brief Narrative:  65 y.o.malewith medical history significant ofA. Fib not on anticoagulation, DM Type 2, HTN, HLD, depression who presents initially with complaints of uncontrolled constipation. Patient admitted to being essentially noncompliant with any of his regimen for the past several years. In the emergency department, patient was noted to have symptomatically hyponatremia and tachycardia. Patient was also mildly hypotensive, receiving IV fluid hydration. Patient also found to have significant stool burden on CT abdomen pelvis. Fecal dis impation attempt was without success. Smog enema yielded limited success. The patient was ultimately admitted for further management of multiple medical issues. Underwent colonoscopy on 12/3 shows a polyp in the cecum, internal hemorrhoids, , ulcers in the rectosigmoid colon, consistent with stercoral  ulcers. Moderate diverticulosis in the sigmoid colon.    1)Cecal Mass/Fecal Impaction/Constipation- underwent Colonoscopy on 11/17/2016, constipation/Fecal impaction appears to have Resolved.  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  -Rectal ulcer shows erosive ulcer and reactive changes.   -GI Recommended miralax 2 to 3 times daily for constipation and repeat colonoscopy in 2 years for polyp surveillance with Dr Henrene Pastor.  -Lap assisted partial colectomy underwent on 12/11. -now awaiting return of bowel function -65 patient diet advancement determine by CCS, appreciate assistance and inputs. -no BM or Flatus currently  2) A. fib with RVR- CHADVASC2 score is 2, refuses anticoagulation, history of noncompliance, continue metoprolol and digoxin as per cardiology,   Echocardiogram was not optimal as cardiac structures were not visualized at all.  -Recommended TEE to assess the cardiac struction if clinically indicated.  -Currently rate controlled.  -will discharge on ASA  3)H/o Glucose intolerance-most recent A1c is 5.2, avoid overaggressive control in order to avoid life-threatening hypoglycemia.  -not anticipating medications at discharge -Plan is for diet control  CBG (last 3)   Recent Labs  11/24/16 1153 11/24/16 1708 11/24/16 2114  GLUCAP 110* 116* 117*    4) BPH with urinary retention -attempting voiding trial -continue flomax  5)AKI- resolved, creatinine is back to normal 0.5, most likely was due to BPH with urinary retention and dehydration in  the setting of possible UTI. Urine culture was negative and patient completed antibiotics.   6)Severe Protein Caloric malnutrition - continue  Nutritional supplements  6) anemia; macrocytic,. Hemoglobin is around 10.2.  -Will Monitor intermittently.   7)Social/Dispo- patient with deconditioning and debility, patient is essentially homeless, Education officer, museum  consult for disposition -will discharge to SNF when stable   Consults  :   Gastroenterology  Surgery.  cardiology  DVT Prophylaxis  :   SCDs /Lovenox  Lab Results  Component Value Date   PLT 247 11/23/2016    Inpatient Medications  Scheduled Meds: . alvimopan  12 mg Oral BID  . busPIRone  5 mg Oral TID  . digoxin  0.125 mg Oral Daily  . feeding supplement  1 Container Oral QID  . hydrocortisone   Rectal BID  . insulin aspart  0-15 Units Subcutaneous TID WC  . metoprolol tartrate  50 mg Oral BID  . sodium chloride flush  3 mL Intravenous Q12H  . tamsulosin  0.4 mg Oral Daily   Continuous Infusions: . 0.9 % NaCl with KCl 20 mEq / L 100 mL/hr at 11/24/16 1401   PRN Meds:.acetaminophen **OR** [DISCONTINUED] acetaminophen, lidocaine, liver oil-zinc oxide, LORazepam, morphine injection, ondansetron **OR** [DISCONTINUED] ondansetron (ZOFRAN) IV    Anti-infectives    Start     Dose/Rate Route Frequency Ordered Stop   11/19/16 0600  cefoTEtan (CEFOTAN) 2 g in dextrose 5 % 50 mL IVPB     2 g 100 mL/hr over 30 Minutes Intravenous On call to O.R. 11/18/16 1645 11/20/16 0559   11/10/16 2000  cefTRIAXone (ROCEPHIN) 1 g in dextrose 5 % 50 mL IVPB     1 g 100 mL/hr over 30 Minutes Intravenous Every 24 hours 11/10/16 1847 11/14/16 2236   11/09/16 2000  cefTRIAXone (ROCEPHIN) 2 g in dextrose 5 % 50 mL IVPB  Status:  Discontinued     2 g 100 mL/hr over 30 Minutes Intravenous Every 24 hours 11/09/16 0312 11/10/16 1847   11/08/16 2045  cefTRIAXone (ROCEPHIN) 2 g in dextrose 5 % 50 mL IVPB     2 g 100 mL/hr over 30 Minutes Intravenous  Once 11/08/16 2042 11/08/16 2229        Objective:   Vitals:   11/24/16 1455 11/24/16 1456 11/24/16 1630 11/24/16 2118  BP: 96/64   114/75  Pulse: (!) 117   74  Resp: 10 15 12 16   Temp: 97.8 F (36.6 C)   98.3 F (36.8 C)  TempSrc: Oral   Oral  SpO2: 95% 97% 96% 95%  Weight:      Height:        Wt Readings from Last 3 Encounters:    11/17/16 93 kg (205 lb)  08/06/14 (!) 157.3 kg (346 lb 12.8 oz)  08/02/14 (!) 157.6 kg (347 lb 8 oz)     Intake/Output Summary (Last 24 hours) at 11/24/16 2212 Last data filed at 11/24/16 1800  Gross per 24 hour  Intake             2000 ml  Output             2400 ml  Net             -400 ml     Physical Exam Gen:- Alert , complaining of abd pain and extreme weakness/debility; no nausea, no vomiting. Denies flatus or BM. HEENT:- [place], AT, No icterus, no nystagmus Neck-Supple Neck,No JVD,.  Lungs-  CTAB   CV-  Irregular, no rubs, no gallops Abd-  Hypoactive bowel sounds, tender generalized; clean wounds from recent abdominal surgery   Extremity/Skin:- trace edema,  No cyanosis     Data Review:   Micro Results Recent Results (from the past 240 hour(s))  Surgical pcr screen     Status: Abnormal   Collection Time: 11/19/16  7:41 AM  Result Value Ref Range Status   MRSA, PCR POSITIVE (A) NEGATIVE Final    Comment: RESULT CALLED TO, READ BACK BY AND VERIFIED WITH: PICKETT,S. RN @1111  ON 12.8.17 BY NMCCOY    Staphylococcus aureus POSITIVE (A) NEGATIVE Final    Comment:        The Xpert SA Assay (FDA approved for NASAL specimens in patients over 3 years of age), is one component of a comprehensive surveillance program.  Test performance has been validated by [facility] for patients greater than or equal to 58 year old. It is not intended to diagnose infection nor to guide or monitor treatment.     Radiology Reports Dg Abdomen 1 View  Result Date: 11/08/2016 CLINICAL DATA:  Constipation over the last month. EXAM: ABDOMEN - 1 VIEW COMPARISON:  None. FINDINGS: Gas pattern does not suggest ileus or obstruction. Amount of fecal matter in the colon is within normal limits with the exception of slightly prominent stool in the rectal vault. No significant soft tissue calcifications. Some vascular calcification noted. Chronic degenerative changes affect the spine. IMPRESSION:  No markedly abnormal findings. Overall amount of fecal matter appears within normal limits. Slightly prominent stool in the rectal vault. Electronically Signed   By: Nelson Chimes M.D.   On: 11/08/2016 14:29   Ct Head Wo Contrast  Result Date: 11/08/2016 CLINICAL DATA:  Generalized weakness. Poor historian. Difficulty raising right arm. EXAM: CT HEAD WITHOUT CONTRAST TECHNIQUE: Contiguous axial images were obtained from the base of the skull through the vertex without intravenous contrast. COMPARISON:  None. FINDINGS: Brain: Ventricles and cisterns are within normal. There is minimal prominence of the CSF spaces compatible with age related atrophy. There is no mass, mass effect, shift of midline structures or acute hemorrhage. No evidence of acute infarction. Vascular: Minimal calcified plaque over the cavernous segment of the internal carotid arteries. Skull: Within normal. Sinuses/Orbits: Within normal. IMPRESSION: No acute intracranial findings. Electronically Signed   By: Marin Olp M.D.   On: 11/08/2016 15:56   Ct Abdomen Pelvis W Contrast  Addendum Date: 11/15/2016   ADDENDUM REPORT: 11/15/2016 08:56 ADDENDUM: Case reviewed prior to barium enema. There is sessile and lobulated mural filling defect in the cecum on the right lateral wall measuring 3 cm in diameter by 12 mm in thickness. While adherent stool can give this appearance, finding is more concerning for a mass. Case discussed with Dr. Fuller Plan via telephone at time of addendum. Patient is under consideration for colonoscopy. Electronically Signed   By: Monte Fantasia M.D.   On: 11/15/2016 08:56   Result Date: 11/15/2016 CLINICAL DATA:  Initial evaluation for weakness, weight loss, constipation. EXAM: CT ABDOMEN AND PELVIS WITH CONTRAST TECHNIQUE: Multidetector CT imaging of the abdomen and pelvis was performed using the standard protocol following bolus administration of intravenous contrast. CONTRAST:  15mL ISOVUE-300 IOPAMIDOL  (ISOVUE-300) INJECTION 61% COMPARISON:  Prior radiograph from earlier the same day. FINDINGS: Lower chest: Mild scattered atelectatic changes present within the visualized lung bases. Visualized lungs are otherwise clear. No pleural or pericardial fusion. Hepatobiliary: Liver demonstrates a normal contrast enhanced appearance. Gallbladder within normal limits. No biliary  dilatation. Pancreas: Pancreas within normal limits. Spleen: Scattered hypodense lesions noted within the spleen, largest of which measures 9 mm. These are indeterminate, but are of doubtful clinical significance. Spleen otherwise unremarkable. Adrenals/Urinary Tract: 2.3 cm hypodense nodule noted within the right adrenal gland, most likely a small adenoma. Left adrenal gland within normal limits. Kidneys equal in size with symmetric enhancement. Scattered parapelvic cyst noted within the left kidney. Few scattered calcifications within the left renal hilum suspicious for small nonobstructive calculi. These measure up to 3 mm. No hydronephrosis. No focal enhancing renal mass. Scattered cortical thinning/scarring noted. Ureters are of normal caliber without acute abnormality. Bladder within normal limits. Stomach/Bowel: Small hiatal hernia noted. Stomach otherwise unremarkable. Small bowel within normal limits for caliber without evidence for obstruction. Colonic diverticulosis without evidence for acute diverticulitis. Appendix not visualize, compatible with history of prior appendectomy. No abnormal wall thickening, mucosal enhancement, or inflammatory fat stranding seen about the bowels. Large amount of retained stool within the rectal vault, suggesting constipation. Vascular/Lymphatic: Normal intravascular enhancement seen throughout the intra-abdominal aorta and its branch vessels. Moderate aorto bi-iliac atherosclerotic disease. No aneurysm. No pathologically enlarged intra-abdominal or pelvic lymph nodes. Reproductive: Prostate within normal  limits. Other: No free air or fluid. Small bilateral fat containing inguinal hernias noted. Musculoskeletal: No acute osseous abnormality. No worrisome lytic or blastic osseous lesions. Moderate multilevel degenerative spondylolysis noted within the visualized spine. IMPRESSION: 1. No CT evidence for acute intra-abdominal or pelvic process. 2. Large amount of retained stool within the rectal vault, suggesting constipation. 3. Sigmoid diverticulosis without evidence for acute diverticulitis. 4. Question punctate nonobstructive left renal nephrolithiasis. Electronically Signed: By: Jeannine Boga M.D. On: 11/08/2016 18:08   Dg Abd 2 Views  Result Date: 11/10/2016 CLINICAL DATA:  Acute generalized abdominal pain. EXAM: ABDOMEN - 2 VIEW COMPARISON:  CT scan of November 08, 2016. FINDINGS: The bowel gas pattern is normal. There is no evidence of free air. No radio-opaque calculi or other significant radiographic abnormality is seen. Stool is noted in the rectum. IMPRESSION: No definite evidence of bowel obstruction or ileus. Electronically Signed   By: Marijo Conception, M.D.   On: 11/10/2016 15:53   Dg Colon Therapeutic W/cm  Result Date: 11/12/2016 CLINICAL DATA:  Severe constipation. EXAM: WATER SOLUBLE CONTRAST ENEMA TECHNIQUE: Approximately 3 L of warm water mixed with 480 cc of Gastrografin was instilled per rectum. FLUOROSCOPY TIME:  Radiation Exposure Index (if provided by the fluoroscopic device): 183.47 mGy COMPARISON:  Radiographs dated 11/10/2016 FINDINGS: EKG knee demonstrates moderate stool in the ascending colon as well as in the rectum. The study shows a 6 mm stool ball in the rectum. There is also stool throughout the remainder of the colon particularly in the cecum. There are few diverticula in the sigmoid portion the colon. The study was suboptimal due to patient's inability to move well during the exam. Multiple smaller stool balls throughout the colon. I was able to get some contrast  into the ascending colon. There are no discrete strictures or definitive mass lesions. The patient was unable to sit up to evacuate the contrast at the end of the exam. IMPRESSION: Extensive stool in the distal colon as well as in the ascending colon. No dilatation of the colon. Scattered diverticula in the sigmoid portion of the colon. No discrete obstruction or mass lesion. However, the study is suboptimal for detail because of the patient's inability to retain the contrast or move well during the exam. Electronically Signed   By: Jeneen Rinks  Maxwell M.D.   On: 11/12/2016 12:46     CBC  Recent Labs Lab 11/18/16 0352 11/22/16 0450 11/23/16 1133  WBC 5.0 4.5 5.7  HGB 8.9* 9.5* 10.2*  HCT 26.9* 28.2* 30.5*  PLT 177 221 247  MCV 101.5* 98.3 102.0*  MCH 33.6 33.1 34.1*  MCHC 33.1 33.7 33.4  RDW 16.2* 16.6* 16.9*    Chemistries   Recent Labs Lab 11/18/16 0352 11/22/16 0450 11/23/16 1133  NA 136 137 137  K 3.6 4.0 4.3  CL 105 104 105  CO2 25 26 26   GLUCOSE 135* 103* 105*  BUN 6 8 9   CREATININE 0.58* 0.58* 0.45*  CALCIUM 7.5* 7.9* 7.5*    Lab Results  Component Value Date   HGBA1C 5.2 11/09/2016   -----------------------------------------------------------------------------------------  Cardiac Enzymes No results for input(s): CKMB, TROPONINI, MYOGLOBIN in the last 168 hours.  Invalid input(s): CK ------------------------------------------------------------------------------------------------------------------    Component Value Date/Time   BNP 94.3 08/15/2013 1834     Barton Dubois M.D on 11/24/2016 at 10:12 PM  Between 7am to 7pm - Pager - [phone]  After 7pm go to [email or web address] - password [email or web address]  [facility] -  Office  [phone]

## 2016-11-24 NOTE — Progress Notes (Signed)
Patient ID: [name], male   DOB: [birth year], 65 y.o.   MRN: [id number]  [facility] Surgery Progress Note  2 Days Post-Op  Subjective: States that his pain is controlled when he sits still, but his pain is severe with any movement or deep breathing. Tolerated sips of clears. Denies n/v. No flatus or BM. Not using IS.  Objective: Vital signs in last 24 hours: Temp:  [97.7 F (36.5 C)-98.3 F (36.8 C)] 98.3 F (36.8 C) (12/13 0604) Pulse Rate:  [54-123] 54 (12/13 0604) Resp:  [10-18] 13 (12/13 0820) BP: (111-122)/(64-77) 111/70 (12/13 0604) SpO2:  [93 %-97 %] 96 % (12/13 0820) Last BM Date: 11/22/16  Intake/Output from previous day: 12/12 0701 - 12/13 0700 In: 2862.7 [I.V.:2862.7] Out: 2550 [Urine:2550] Intake/Output this shift: No intake/output data recorded.  PE: Gen:  Alert, NAD, pleasant Card:  Irregular, tachycardic Pulm:  CTAB, no W/R/R Abd: Soft, globally tender, nondistended, hypoactive Bs, incisions C/D/I with midline incision staples and honeycomb dressing in place  Lab Results:   Recent Labs  11/22/16 0450 11/23/16 1133  WBC 4.5 5.7  HGB 9.5* 10.2*  HCT 28.2* 30.5*  PLT 221 247   BMET  Recent Labs  11/22/16 0450 11/23/16 1133  NA 137 137  K 4.0 4.3  CL 104 105  CO2 26 26  GLUCOSE 103* 105*  BUN 8 9  CREATININE 0.58* 0.45*  CALCIUM 7.9* 7.5*   PT/INR No results for input(s): LABPROT, INR in the last 72 hours. CMP     Component Value Date/Time   NA 137 11/23/2016 1133   K 4.3 11/23/2016 1133   CL 105 11/23/2016 1133   CO2 26 11/23/2016 1133   GLUCOSE 105 (H) 11/23/2016 1133   BUN 9 11/23/2016 1133   CREATININE 0.45 (L) 11/23/2016 1133   CREATININE 1.10 08/02/2014 1505   CALCIUM 7.5 (L) 11/23/2016 1133   PROT 4.7 (L) 11/12/2016 0430   ALBUMIN 2.3 (L) 11/12/2016 0430   AST 11 (L) 11/12/2016 0430   ALT 7 (L) 11/12/2016 0430   ALKPHOS 48 11/12/2016 0430   BILITOT 0.6 11/12/2016 0430   GFRNONAA >60 11/23/2016 1133   GFRNONAA  71 08/02/2014 1505   GFRAA >60 11/23/2016 1133   GFRAA 82 08/02/2014 1505   Lipase     Component Value Date/Time   LIPASE 25 11/08/2016 1426       Studies/Results: No results found.  Anti-infectives: Anti-infectives    Start     Dose/Rate Route Frequency Ordered Stop   11/19/16 0600  cefoTEtan (CEFOTAN) 2 g in dextrose 5 % 50 mL IVPB     2 g 100 mL/hr over 30 Minutes Intravenous On call to O.R. 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 Zella Richer - POD 2 - surgical path pending - on Entereg - no flatus  A fib  Glucose intolerance BPH Severe protein calorie malnutrition Homeless  VTE - SCDs FEN - IVF, clear liquid diet  Plan - continue CLD until bowel function returns. Encourage PT. Continue PCA pump for today. Ok to restart anticoagulation from surgical standpoint. Appreciate medicine input on A fib/tachycardia (HR in 120s while I was in room). Check prealbumin   LOS: 16 days    Jerrye Beavers , [facility] Surgery 11/24/2016, 8:25 AM Pager: [phone] Consults: [phone] Mon-Fri 7:00 am-4:30 pm Sat-Sun 7:00 am-11:30 am

## 2016-11-24 NOTE — Progress Notes (Signed)
Physical Therapy Treatment Patient Details Name: [name] MRN: [id number] DOB: 11-Apr-1951 Today's Date: 11/24/2016    History of Present Illness  [name] is a 65 y.o. male with medical history significant of A. Fib not on anticoagulation, DM Type 2, HTN, HLD, depression - he is not taking medication for any of these problems and has not seen a physician in 2 years.  Patient with severe constipation, malnutrition, having trouble walking, having trouble keeping energy.  Right arm partially immobile - ?pinched nerve in neck for months.  Abnormal lesion in cecum on original CT and pt with planned colon sx on 12/9    PT Comments    Pt required MAX encouragement.  Pt required + 2 assist and increased time to transfer from bed to recliner.  POD # 2 LAP limited by pain and some dizziness when sitting EOB.    Follow Up Recommendations  SNF;Supervision for mobility/OOB     Equipment Recommendations  None recommended by PT    Recommendations for Other Services       Precautions / Restrictions Precautions Precautions: Fall Precaution Comments: recent ABD surgery Restrictions Weight Bearing Restrictions: No    Mobility  Bed Mobility Overal bed mobility: Needs Assistance Bed Mobility: Supine to Sit     Supine to sit: Max assist;+2 for safety/equipment     General bed mobility comments: HOB elevated and + 2 assist due to recent ABD surgery and pt's pain level.  Required increased time and MAX encouragement  Transfers Overall transfer level: Needs assistance Equipment used: Rolling walker (2 wheeled) Transfers: Sit to/from Stand Sit to Stand: Min assist;+2 physical assistance;+2 safety/equipment         General transfer comment: verbal cues for safe technique, assist to rise and control descent.  Pt only agreed to transfer to recliner.    Ambulation/Gait                 Stairs            Wheelchair Mobility    Modified Rankin (Stroke Patients Only)       Balance                                    Cognition Arousal/Alertness: Awake/alert Behavior During Therapy: WFL for tasks assessed/performed Overall Cognitive Status: Within Functional Limits for tasks assessed                      Exercises      General Comments        Pertinent Vitals/Pain Pain Assessment: Faces Faces Pain Scale: Hurts even more Pain Location: ABD incision Pain Descriptors / Indicators: Grimacing;Operative site guarding;Discomfort Pain Intervention(s): Monitored during session;Repositioned    Home Living                      Prior Function            PT Goals (current goals can now be found in the care plan section) Progress towards PT goals: Progressing toward goals    Frequency    Min 3X/week      PT Plan Current plan remains appropriate    Co-evaluation             End of Session Equipment Utilized During Treatment: Gait belt Activity Tolerance:  (pt self limiting) Patient left: in chair;with call bell/phone within reach     Time:  [id number] PT Time Calculation (min) (ACUTE ONLY): 26 min  Charges:  $Therapeutic Activity: 23-37 mins                    G Codes:      Rica Koyanagi  PTA [facility]  Acute  Rehab Pager      [phone]

## 2016-11-25 DIAGNOSIS — I480 Paroxysmal atrial fibrillation: Secondary | ICD-10-CM | POA: Diagnosis not present

## 2016-11-25 DIAGNOSIS — R109 Unspecified abdominal pain: Secondary | ICD-10-CM | POA: Diagnosis not present

## 2016-11-25 DIAGNOSIS — I482 Chronic atrial fibrillation: Secondary | ICD-10-CM | POA: Diagnosis not present

## 2016-11-25 DIAGNOSIS — I481 Persistent atrial fibrillation: Secondary | ICD-10-CM | POA: Diagnosis not present

## 2016-11-25 DIAGNOSIS — E871 Hypo-osmolality and hyponatremia: Secondary | ICD-10-CM | POA: Diagnosis not present

## 2016-11-25 DIAGNOSIS — R1084 Generalized abdominal pain: Secondary | ICD-10-CM | POA: Diagnosis not present

## 2016-11-25 DIAGNOSIS — N183 Chronic kidney disease, stage 3 (moderate): Secondary | ICD-10-CM | POA: Diagnosis not present

## 2016-11-25 DIAGNOSIS — E1169 Type 2 diabetes mellitus with other specified complication: Secondary | ICD-10-CM | POA: Diagnosis not present

## 2016-11-25 DIAGNOSIS — K5900 Constipation, unspecified: Secondary | ICD-10-CM | POA: Diagnosis not present

## 2016-11-25 DIAGNOSIS — N179 Acute kidney failure, unspecified: Secondary | ICD-10-CM | POA: Diagnosis not present

## 2016-11-25 DIAGNOSIS — K5901 Slow transit constipation: Secondary | ICD-10-CM | POA: Diagnosis not present

## 2016-11-25 LAB — CBC
HCT: 30.5 % — ABNORMAL LOW (ref 39.0–52.0)
HEMOGLOBIN: 10.2 g/dL — AB (ref 13.0–17.0)
MCH: 34.2 pg — AB (ref 26.0–34.0)
MCHC: 33.4 g/dL (ref 30.0–36.0)
MCV: 102.3 fL — AB (ref 78.0–100.0)
Platelets: 240 10*3/uL (ref 150–400)
RBC: 2.98 MIL/uL — AB (ref 4.22–5.81)
RDW: 17.5 % — ABNORMAL HIGH (ref 11.5–15.5)
WBC: 4 10*3/uL (ref 4.0–10.5)

## 2016-11-25 LAB — BASIC METABOLIC PANEL
Anion gap: 5 (ref 5–15)
BUN: 7 mg/dL (ref 6–20)
CHLORIDE: 102 mmol/L (ref 101–111)
CO2: 26 mmol/L (ref 22–32)
Calcium: 7.4 mg/dL — ABNORMAL LOW (ref 8.9–10.3)
Creatinine, Ser: 0.49 mg/dL — ABNORMAL LOW (ref 0.61–1.24)
GFR calc Af Amer: >60 mL/min (ref >60)
GFR calc non Af Amer: >60 mL/min (ref >60)
GLUCOSE: 115 mg/dL — AB (ref 65–99)
Potassium: 4 mmol/L (ref 3.5–5.1)
Sodium: 133 mmol/L — ABNORMAL LOW (ref 135–145)

## 2016-11-25 LAB — GLUCOSE, CAPILLARY
GLUCOSE-CAPILLARY: 122 mg/dL — AB (ref 65–99)
GLUCOSE-CAPILLARY: 154 mg/dL — AB (ref 65–99)
GLUCOSE-CAPILLARY: 127 mg/dL — AB (ref 65–99)
Glucose-Capillary: 176 mg/dL — ABNORMAL HIGH (ref 65–99)

## 2016-11-25 LAB — PREALBUMIN: Prealbumin: 12.5 mg/dL — ABNORMAL LOW (ref 18–38)

## 2016-11-25 MED ORDER — METHOCARBAMOL 500 MG PO TABS
500.0000 mg | ORAL_TABLET | Freq: Three times a day (TID) | ORAL | Status: DC | PRN
  Administered 2016-11-25 – 2016-11-26 (×2): 500 mg via ORAL
  Filled 2016-11-25 (×2): qty 1

## 2016-11-25 MED ORDER — METOPROLOL TARTRATE 5 MG/5ML IV SOLN
2.5000 mg | Freq: Once | INTRAVENOUS | Status: AC
  Administered 2016-11-25: 2.5 mg via INTRAVENOUS
  Filled 2016-11-25: qty 5

## 2016-11-25 MED ORDER — METOPROLOL TARTRATE 50 MG PO TABS
75.0000 mg | ORAL_TABLET | Freq: Two times a day (BID) | ORAL | Status: DC
  Administered 2016-11-25 – 2016-11-26 (×3): 75 mg via ORAL
  Filled 2016-11-25 (×3): qty 1

## 2016-11-25 MED ORDER — ASPIRIN EC 81 MG PO TBEC
81.0000 mg | DELAYED_RELEASE_TABLET | Freq: Every day | ORAL | Status: DC
  Administered 2016-11-25 – 2016-11-26 (×2): 81 mg via ORAL
  Filled 2016-11-25 (×2): qty 1

## 2016-11-25 MED ORDER — OXYCODONE HCL 5 MG PO TABS
5.0000 mg | ORAL_TABLET | ORAL | Status: DC | PRN
  Administered 2016-11-25: 5 mg via ORAL
  Administered 2016-11-26 (×4): 10 mg via ORAL
  Filled 2016-11-25: qty 2
  Filled 2016-11-25: qty 1
  Filled 2016-11-25: qty 2
  Filled 2016-11-25: qty 1
  Filled 2016-11-25 (×2): qty 2

## 2016-11-25 MED ORDER — MORPHINE SULFATE (PF) 2 MG/ML IV SOLN
2.0000 mg | INTRAVENOUS | Status: DC | PRN
  Administered 2016-11-25 – 2016-11-26 (×3): 2 mg via INTRAVENOUS
  Filled 2016-11-25 (×3): qty 1

## 2016-11-25 MED ORDER — ENSURE ENLIVE PO LIQD
237.0000 mL | Freq: Four times a day (QID) | ORAL | Status: DC
  Administered 2016-11-25 – 2016-11-26 (×3): 237 mL via ORAL

## 2016-11-25 MED ORDER — ENOXAPARIN SODIUM 40 MG/0.4ML ~~LOC~~ SOLN
40.0000 mg | SUBCUTANEOUS | Status: DC
  Administered 2016-11-25: 40 mg via SUBCUTANEOUS
  Filled 2016-11-25: qty 0.4

## 2016-11-25 NOTE — Progress Notes (Signed)
Pt PCA was discontinued. Wasted 24 mL of Morphine 2mg /mL PCA in the sharps container with Melissa O'Bryant.   Arville Lime, RN

## 2016-11-25 NOTE — Progress Notes (Signed)
He is progressing well on the postop colorectal surgery pathway.  Will advance to a soft diet.

## 2016-11-25 NOTE — Progress Notes (Signed)
Patient ID: [name], male   DOB: June 24, 1951, 65 y.o.   MRN: [id number]  [facility] Surgery Progress Note  3 Days Post-Op  Subjective: Continues to complain of abdominal sensitivity and severe pain with movement. Tolerating full liquid diet. Passing significant amount of flatus and had loose/soft BM this morning.  Objective: Vital signs in last 24 hours: Temp:  [97.8 F (36.6 C)-98.3 F (36.8 C)] 98.2 F (36.8 C) (12/14 0623) Pulse Rate:  [74-130] 127 (12/14 0852) Resp:  [10-16] 12 (12/14 0623) BP: (96-125)/(64-88) 115/70 (12/14 0852) SpO2:  [95 %-97 %] 95 % (12/14 0623) Last BM Date: 11/22/16  Intake/Output from previous day: 12/13 0701 - 12/14 0700 In: 2386.7 [I.V.:2386.7] Out: 2050 [Urine:2050] Intake/Output this shift: No intake/output data recorded.  PE: Gen:  Alert, NAD, pleasant Card:  Irregular, tachycardic Pulm:  CTAB, no W/R/R Abd: Soft, globally tender and hypersensitive, nondistended, present but hypoactive BS, incisions C/D/I with midline incision staples and honeycomb dressing in place  Lab Results:   Recent Labs  11/23/16 1133 11/25/16 0458  WBC 5.7 4.0  HGB 10.2* 10.2*  HCT 30.5* 30.5*  PLT 247 240   BMET  Recent Labs  11/23/16 1133 11/25/16 0458  NA 137 133*  K 4.3 4.0  CL 105 102  CO2 26 26  GLUCOSE 105* 115*  BUN 9 7  CREATININE 0.45* 0.49*  CALCIUM 7.5* 7.4*   PT/INR No results for input(s): LABPROT, INR in the last 72 hours. CMP     Component Value Date/Time   NA 133 (L) 11/25/2016 0458   K 4.0 11/25/2016 0458   CL 102 11/25/2016 0458   CO2 26 11/25/2016 0458   GLUCOSE 115 (H) 11/25/2016 0458   BUN 7 11/25/2016 0458   CREATININE 0.49 (L) 11/25/2016 0458   CREATININE 1.10 08/02/2014 1505   CALCIUM 7.4 (L) 11/25/2016 0458   PROT 4.7 (L) 11/12/2016 0430   ALBUMIN 2.3 (L) 11/12/2016 0430   AST 11 (L) 11/12/2016 0430   ALT 7 (L) 11/12/2016 0430   ALKPHOS 48 11/12/2016 0430   BILITOT 0.6 11/12/2016 0430   GFRNONAA  >60 11/25/2016 0458   GFRNONAA 71 08/02/2014 1505   GFRAA >60 11/25/2016 0458   GFRAA 82 08/02/2014 1505   Lipase     Component Value Date/Time   LIPASE 25 11/08/2016 1426       Studies/Results: No results found.  Anti-infectives: Anti-infectives    Start     Dose/Rate Route Frequency Ordered Stop   11/19/16 0600  cefoTEtan (CEFOTAN) 2 g in dextrose 5 % 50 mL IVPB     2 g 100 mL/hr over 30 Minutes Intravenous On call to O.R. 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 Zella Richer -  POD 3 - surgical path pending  A fib - on metoprolol and digoxin per cardiology Glucose intolerance BPH Severe protein calorie malnutrition - prealbumin 12.5 (11/25/16) Homeless  VTE - SCDs, Lovenox FEN - IVF, full liquid diet  Plan - bowel function returning. D/c Entereg. continue full liquid diet, may advance later today if he tolerates FLD. Discussed importance of ambulation/mobilization for recovery. Encourage PT. Add robaxin PRN for pain control. rate control per medicine/cardiology.   LOS: 17 days    Jerrye Beavers , [facility] Surgery 11/25/2016, 9:31 AM Pager: [phone] Consults: [phone] Mon-Fri 7:00 am-4:30 pm Sat-Sun 7:00 am-11:30 am

## 2016-11-25 NOTE — Progress Notes (Signed)
Nutrition Follow-up  DOCUMENTATION CODES:   Severe malnutrition in context of chronic illness  INTERVENTION:  Discontinued Boost Breeze.  Provide Ensure Enlive po QID, each supplement provides 350 kcal and 20 grams of protein.  Attempted to review High-Calorie, High-Protein Nutrition Therapy handout with patient. He was in too much pain to review, but left handout at bedside. Reinforced that he needs adequate calories and protein to prevent further weight loss and reviewed FLD with patient again.   NUTRITION DIAGNOSIS:   Inadequate oral intake related to poor appetite, other (see comment) (constipation) as evidenced by per patient/family report, percent weight loss.  Ongoing.  GOAL:   Patient will meet greater than or equal to 90% of their needs  Progressing.   MONITOR:   PO intake, Supplement acceptance, Labs, Weight trends, I & O's, Skin  REASON FOR ASSESSMENT:   Consult Assessment of nutrition requirement/status  ASSESSMENT:   65 y.o.malewith medical history significant ofA. Fib not on anticoagulation, DM Type 2, HTN, HLD, depression who presents initially with complaints of uncontrolled constipation. Patient admitted to being essentially noncompliant with any of his regimen for the past several years.  -Patient was found to have tubulovillous adenoma of cecum with focal high-grade dysplasia now s/p laparoscopic hand assisted lysis of adhesions and right colectomy on 12/11.   Spoke with patient at bedside. He reports appetite remains poor. He is having abdominal pain and discomfort. Denies nausea. Reports loose stools. RD attempted to obtain weight but patient is in chair and in too much pain for standing weight.   Meal Completion: 50-100% In the past 24 hours patient has had approximately 1297 kcal (65% minimum estimated kcal needs) and 32 grams of protein (34% minimum estimated protein needs).   Medications reviewed and include: Novolog sliding scale TID with  meals, NS with KCl 20 mEq/L @ 100 ml/hr.  Labs reviewed: CBG 116-154 past 24 hrs, Sodium 133, Creatinine 0.49, Glucose 115.   Diet Order:  Diet full liquid Room service appropriate? Yes; Fluid consistency: Thin  Skin:  Reviewed, no issues  Last BM:  11/25/2016  Height:   Ht Readings from Last 1 Encounters:  11/17/16 6\' 3"  (1.905 m)    Weight:   Wt Readings from Last 1 Encounters:  11/17/16 205 lb (93 kg)    Ideal Body Weight:  86.36 kg  BMI:  Body mass index is 25.62 kg/m.  Estimated Nutritional Needs:   Kcal:  2000-2300 (MSJ x 1.1-1.3)  Protein:  95-115 grams (1-1.2 grams/kg)  Fluid:  >/= 2.3 L/day (25 ml/kg)  EDUCATION NEEDS:   Education needs addressed (Encouraged adequate intake of calories and protein through small, frequent meals. Also discussed liquids may be best tolerated now.)  Willey Blade, MS, RD, LDN Pager: [phone] After Hours Pager: [phone]

## 2016-11-25 NOTE — Progress Notes (Signed)
Patient Demographics:    [name], is a 65 y.o. male, DOB - 02-14-1951, [id number]  Admit date - 11/08/2016   Admitting Physician Karmen Bongo, MD  Outpatient Primary MD for the patient is Sandi Mariscal, MD  LOS - 30   Chief Complaint  Patient presents with  . Constipation  . Weakness        Subjective:    [name] is complaining of abd pain, increase weakness and debility. No CP or SOB.       Principal Problem:   Constipation Active Problems:   Essential hypertension, benign   Atrial fibrillation (HCC)   Diabetes mellitus type 2 in obese (HCC)   Hyponatremia   Urinary tract infection without hematuria   Hypokalemia   CKD (chronic kidney disease) stage 3, GFR 30-59 ml/min   Hyperbilirubinemia   Homelessness   Protein-calorie malnutrition, severe   Impaction of colon (HCC)   Abdominal pain   Generalized weakness   Fecal impaction (HCC)   AKI (acute kidney injury) (HCC)   Abnormal CT scan, colon   Benign neoplasm of cecum   Polyp of cecum  Brief Narrative:  65 y.o.malewith medical history significant ofA. Fib not on anticoagulation, DM Type 2, HTN, HLD, depression who presents initially with complaints of uncontrolled constipation. Patient admitted to being essentially noncompliant with any of his regimen for the past several years. In the emergency department, patient was noted to have symptomatically hyponatremia and tachycardia. Patient was also mildly hypotensive, receiving IV fluid hydration. Patient also found to have significant stool burden on CT abdomen pelvis. Fecal dis impation attempt was without success. Smog enema yielded limited success. The patient was ultimately admitted for further management of multiple medical issues. Underwent colonoscopy on 12/3 shows a polyp in the cecum, internal hemorrhoids, , ulcers in the rectosigmoid colon, consistent with stercoral  ulcers. Moderate diverticulosis in the sigmoid colon.    1)Cecal Mass/Fecal Impaction/Constipation- underwent Colonoscopy on 11/17/2016, constipation/Fecal impaction appears to have Resolved.  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  -Rectal ulcer shows erosive ulcer and reactive changes.   -GI Recommended miralax 2 times daily for constipation and repeat colonoscopy in 2 years for Surveillance with Dr Henrene Pastor.  -Lap assisted partial colectomy underwent on 12/11. -now awaiting complete return of bowel function/stability tolerating PO's -patient diet advancement determine by CCS, appreciate assistance and inputs. Attempting Full liquid diet today -positive BM now, multiple episodes -will add PO pain meds.  2) A. fib with RVR- CHADVASC2 score is 2, refuses anticoagulation, history of noncompliance, continue metoprolol and digoxin as per cardiology,   Echocardiogram was not optimal as cardiac structures were not visualized at all.  -Recommended TEE to assess the cardiac struction if clinically indicated.  -Currently rate controlled.  -will discharge on ASA  3)H/o Glucose intolerance-most recent A1c is 5.2, avoid overaggressive control in order to avoid life-threatening hypoglycemia.  -not anticipating medications at discharge -Plan is for diet control at discharge  CBG (last 3)   Recent Labs  11/25/16 0731 11/25/16 1119 11/25/16 1711  GLUCAP 122* 154* 127*    4) BPH with urinary retention -So far has tolerated voiding trial -continue flomax  5)AKI- resolved, creatinine is back  to normal 0.5, most likely was due to BPH with urinary retention and dehydration in the setting of possible UTI. Urine culture was negative and patient completed antibiotics.   6)Severe Protein Caloric malnutrition - continue Nutritional supplements  6) anemia; macrocytic,. Hemoglobin is around 10.2.  -Will Monitor intermittently.     7)Social/Dispo- patient with deconditioning and debility, patient is essentially homeless, Education officer, museum consult for disposition -will discharge to SNF when stable   Consults  :   Gastroenterology  Surgery.  Cardiology  DVT Prophylaxis  :   SCDs /Lovenox  Lab Results  Component Value Date   PLT 240 11/25/2016    Inpatient Medications  Scheduled Meds: . aspirin EC  81 mg Oral Daily  . busPIRone  5 mg Oral TID  . digoxin  0.125 mg Oral Daily  . enoxaparin (LOVENOX) injection  40 mg Subcutaneous Q24H  . feeding supplement (ENSURE ENLIVE)  237 mL Oral QID  . hydrocortisone   Rectal BID  . insulin aspart  0-15 Units Subcutaneous TID WC  . metoprolol tartrate  75 mg Oral BID  . sodium chloride flush  3 mL Intravenous Q12H  . tamsulosin  0.4 mg Oral Daily   Continuous Infusions: . 0.9 % NaCl with KCl 20 mEq / L 100 mL/hr at 11/25/16 1058   PRN Meds:.acetaminophen **OR** [DISCONTINUED] acetaminophen, lidocaine, liver oil-zinc oxide, LORazepam, methocarbamol, morphine injection, ondansetron **OR** [DISCONTINUED] ondansetron (ZOFRAN) IV, oxyCODONE    Anti-infectives    Start     Dose/Rate Route Frequency Ordered Stop   11/19/16 0600  cefoTEtan (CEFOTAN) 2 g in dextrose 5 % 50 mL IVPB     2 g 100 mL/hr over 30 Minutes Intravenous On call to O.R. 11/18/16 1645 11/20/16 0559   11/10/16 2000  cefTRIAXone (ROCEPHIN) 1 g in dextrose 5 % 50 mL IVPB     1 g 100 mL/hr over 30 Minutes Intravenous Every 24 hours 11/10/16 1847 11/14/16 2236   11/09/16 2000  cefTRIAXone (ROCEPHIN) 2 g in dextrose 5 % 50 mL IVPB  Status:  Discontinued     2 g 100 mL/hr over 30 Minutes Intravenous Every 24 hours 11/09/16 0312 11/10/16 1847   11/08/16 2045  cefTRIAXone (ROCEPHIN) 2 g in dextrose 5 % 50 mL IVPB     2 g 100 mL/hr over 30 Minutes Intravenous  Once 11/08/16 2042 11/08/16 2229        Objective:   Vitals:   11/25/16 0623 11/25/16 0852 11/25/16 1040 11/25/16 1232  BP: 125/88 115/70  (!)  99/57  Pulse: (!) 130 (!) 127 87 (!) 113  Resp: 12   18  Temp: 98.2 F (36.8 C)   97.7 F (36.5 C)  TempSrc: Oral   Oral  SpO2: 95%   97%  Weight:      Height:        Wt Readings from Last 3 Encounters:  11/17/16 93 kg (205 lb)  08/06/14 (!) 157.3 kg (346 lb 12.8 oz)  08/02/14 (!) 157.6 kg (347 lb 8 oz)     Intake/Output Summary (Last 24 hours) at 11/25/16 1727 Last data filed at 11/25/16 0900  Gross per 24 hour  Intake             1840 ml  Output             1250 ml  Net              590 ml     Physical Exam Gen:- Alert ,  continue complaining of abd pain and extreme weakness/debility; no nausea, no vomiting. Reports multiple BM's HEENT:- [place], AT, No icterus, no nystagmus Neck-Supple Neck,No JVD,.  Lungs-  CTAB   CV-  Irregular, no rubs, no gallops Abd-  Hypoactive bowel sounds, tender generalized; clean wounds from recent abdominal surgery   Extremity/Skin:- trace edema,  No cyanosis     Data Review:   Micro Results Recent Results (from the past 240 hour(s))  Surgical pcr screen     Status: Abnormal   Collection Time: 11/19/16  7:41 AM  Result Value Ref Range Status   MRSA, PCR POSITIVE (A) NEGATIVE Final    Comment: RESULT CALLED TO, READ BACK BY AND VERIFIED WITH: PICKETT,S. RN @1111  ON 12.8.17 BY NMCCOY    Staphylococcus aureus POSITIVE (A) NEGATIVE Final    Comment:        The Xpert SA Assay (FDA approved for NASAL specimens in patients over 50 years of age), is one component of a comprehensive surveillance program.  Test performance has been validated by [facility] for patients greater than or equal to 81 year old. It is not intended to diagnose infection nor to guide or monitor treatment.     Radiology Reports Dg Abdomen 1 View  Result Date: 11/08/2016 CLINICAL DATA:  Constipation over the last month. EXAM: ABDOMEN - 1 VIEW COMPARISON:  None. FINDINGS: Gas pattern does not suggest ileus or obstruction. Amount of fecal matter in the colon is  within normal limits with the exception of slightly prominent stool in the rectal vault. No significant soft tissue calcifications. Some vascular calcification noted. Chronic degenerative changes affect the spine. IMPRESSION: No markedly abnormal findings. Overall amount of fecal matter appears within normal limits. Slightly prominent stool in the rectal vault. Electronically Signed   By: Nelson Chimes M.D.   On: 11/08/2016 14:29   Ct Head Wo Contrast  Result Date: 11/08/2016 CLINICAL DATA:  Generalized weakness. Poor historian. Difficulty raising right arm. EXAM: CT HEAD WITHOUT CONTRAST TECHNIQUE: Contiguous axial images were obtained from the base of the skull through the vertex without intravenous contrast. COMPARISON:  None. FINDINGS: Brain: Ventricles and cisterns are within normal. There is minimal prominence of the CSF spaces compatible with age related atrophy. There is no mass, mass effect, shift of midline structures or acute hemorrhage. No evidence of acute infarction. Vascular: Minimal calcified plaque over the cavernous segment of the internal carotid arteries. Skull: Within normal. Sinuses/Orbits: Within normal. IMPRESSION: No acute intracranial findings. Electronically Signed   By: Marin Olp M.D.   On: 11/08/2016 15:56   Ct Abdomen Pelvis W Contrast  Addendum Date: 11/15/2016   ADDENDUM REPORT: 11/15/2016 08:56 ADDENDUM: Case reviewed prior to barium enema. There is sessile and lobulated mural filling defect in the cecum on the right lateral wall measuring 3 cm in diameter by 12 mm in thickness. While adherent stool can give this appearance, finding is more concerning for a mass. Case discussed with Dr. Fuller Plan via telephone at time of addendum. Patient is under consideration for colonoscopy. Electronically Signed   By: Monte Fantasia M.D.   On: 11/15/2016 08:56   Result Date: 11/15/2016 CLINICAL DATA:  Initial evaluation for weakness, weight loss, constipation. EXAM: CT ABDOMEN AND  PELVIS WITH CONTRAST TECHNIQUE: Multidetector CT imaging of the abdomen and pelvis was performed using the standard protocol following bolus administration of intravenous contrast. CONTRAST:  145mL ISOVUE-300 IOPAMIDOL (ISOVUE-300) INJECTION 61% COMPARISON:  Prior radiograph from earlier the same day. FINDINGS: Lower chest: Mild  scattered atelectatic changes present within the visualized lung bases. Visualized lungs are otherwise clear. No pleural or pericardial fusion. Hepatobiliary: Liver demonstrates a normal contrast enhanced appearance. Gallbladder within normal limits. No biliary dilatation. Pancreas: Pancreas within normal limits. Spleen: Scattered hypodense lesions noted within the spleen, largest of which measures 9 mm. These are indeterminate, but are of doubtful clinical significance. Spleen otherwise unremarkable. Adrenals/Urinary Tract: 2.3 cm hypodense nodule noted within the right adrenal gland, most likely a small adenoma. Left adrenal gland within normal limits. Kidneys equal in size with symmetric enhancement. Scattered parapelvic cyst noted within the left kidney. Few scattered calcifications within the left renal hilum suspicious for small nonobstructive calculi. These measure up to 3 mm. No hydronephrosis. No focal enhancing renal mass. Scattered cortical thinning/scarring noted. Ureters are of normal caliber without acute abnormality. Bladder within normal limits. Stomach/Bowel: Small hiatal hernia noted. Stomach otherwise unremarkable. Small bowel within normal limits for caliber without evidence for obstruction. Colonic diverticulosis without evidence for acute diverticulitis. Appendix not visualize, compatible with history of prior appendectomy. No abnormal wall thickening, mucosal enhancement, or inflammatory fat stranding seen about the bowels. Large amount of retained stool within the rectal vault, suggesting constipation. Vascular/Lymphatic: Normal intravascular enhancement seen  throughout the intra-abdominal aorta and its branch vessels. Moderate aorto bi-iliac atherosclerotic disease. No aneurysm. No pathologically enlarged intra-abdominal or pelvic lymph nodes. Reproductive: Prostate within normal limits. Other: No free air or fluid. Small bilateral fat containing inguinal hernias noted. Musculoskeletal: No acute osseous abnormality. No worrisome lytic or blastic osseous lesions. Moderate multilevel degenerative spondylolysis noted within the visualized spine. IMPRESSION: 1. No CT evidence for acute intra-abdominal or pelvic process. 2. Large amount of retained stool within the rectal vault, suggesting constipation. 3. Sigmoid diverticulosis without evidence for acute diverticulitis. 4. Question punctate nonobstructive left renal nephrolithiasis. Electronically Signed: By: Jeannine Boga M.D. On: 11/08/2016 18:08   Dg Abd 2 Views  Result Date: 11/10/2016 CLINICAL DATA:  Acute generalized abdominal pain. EXAM: ABDOMEN - 2 VIEW COMPARISON:  CT scan of November 08, 2016. FINDINGS: The bowel gas pattern is normal. There is no evidence of free air. No radio-opaque calculi or other significant radiographic abnormality is seen. Stool is noted in the rectum. IMPRESSION: No definite evidence of bowel obstruction or ileus. Electronically Signed   By: Marijo Conception, M.D.   On: 11/10/2016 15:53   Dg Colon Therapeutic W/cm  Result Date: 11/12/2016 CLINICAL DATA:  Severe constipation. EXAM: WATER SOLUBLE CONTRAST ENEMA TECHNIQUE: Approximately 3 L of warm water mixed with 480 cc of Gastrografin was instilled per rectum. FLUOROSCOPY TIME:  Radiation Exposure Index (if provided by the fluoroscopic device): 183.47 mGy COMPARISON:  Radiographs dated 11/10/2016 FINDINGS: EKG knee demonstrates moderate stool in the ascending colon as well as in the rectum. The study shows a 6 mm stool ball in the rectum. There is also stool throughout the remainder of the colon particularly in the cecum.  There are few diverticula in the sigmoid portion the colon. The study was suboptimal due to patient's inability to move well during the exam. Multiple smaller stool balls throughout the colon. I was able to get some contrast into the ascending colon. There are no discrete strictures or definitive mass lesions. The patient was unable to sit up to evacuate the contrast at the end of the exam. IMPRESSION: Extensive stool in the distal colon as well as in the ascending colon. No dilatation of the colon. Scattered diverticula in the sigmoid portion of the colon. No  discrete obstruction or mass lesion. However, the study is suboptimal for detail because of the patient's inability to retain the contrast or move well during the exam. Electronically Signed   By: Lorriane Shire M.D.   On: 11/12/2016 12:46     CBC  Recent Labs Lab 11/22/16 0450 11/23/16 1133 11/25/16 0458  WBC 4.5 5.7 4.0  HGB 9.5* 10.2* 10.2*  HCT 28.2* 30.5* 30.5*  PLT 221 247 240  MCV 98.3 102.0* 102.3*  MCH 33.1 34.1* 34.2*  MCHC 33.7 33.4 33.4  RDW 16.6* 16.9* 17.5*    Chemistries   Recent Labs Lab 11/22/16 0450 11/23/16 1133 11/25/16 0458  NA 137 137 133*  K 4.0 4.3 4.0  CL 104 105 102  CO2 26 26 26   GLUCOSE 103* 105* 115*  BUN 8 9 7   CREATININE 0.58* 0.45* 0.49*  CALCIUM 7.9* 7.5* 7.4*    Lab Results  Component Value Date   HGBA1C 5.2 11/09/2016   -----------------------------------------------------------------------------------------  Cardiac Enzymes No results for input(s): CKMB, TROPONINI, MYOGLOBIN in the last 168 hours.  Invalid input(s): CK ------------------------------------------------------------------------------------------------------------------    Component Value Date/Time   BNP 94.3 08/15/2013 1834     Barton Dubois M.D on 11/25/2016 at 5:27 PM  Between 7am to 7pm - Pager [phone]  After 7pm go to [email or web address] - password [facility]  [facility] -  Office   [phone]

## 2016-11-26 DIAGNOSIS — N390 Urinary tract infection, site not specified: Secondary | ICD-10-CM | POA: Diagnosis not present

## 2016-11-26 DIAGNOSIS — E43 Unspecified severe protein-calorie malnutrition: Secondary | ICD-10-CM | POA: Diagnosis not present

## 2016-11-26 DIAGNOSIS — K5900 Constipation, unspecified: Secondary | ICD-10-CM | POA: Diagnosis not present

## 2016-11-26 DIAGNOSIS — R933 Abnormal findings on diagnostic imaging of other parts of digestive tract: Secondary | ICD-10-CM | POA: Diagnosis not present

## 2016-11-26 DIAGNOSIS — N183 Chronic kidney disease, stage 3 (moderate): Secondary | ICD-10-CM | POA: Diagnosis not present

## 2016-11-26 DIAGNOSIS — I4891 Unspecified atrial fibrillation: Secondary | ICD-10-CM | POA: Diagnosis not present

## 2016-11-26 DIAGNOSIS — K5909 Other constipation: Secondary | ICD-10-CM

## 2016-11-26 DIAGNOSIS — N179 Acute kidney failure, unspecified: Secondary | ICD-10-CM | POA: Diagnosis not present

## 2016-11-26 DIAGNOSIS — E871 Hypo-osmolality and hyponatremia: Secondary | ICD-10-CM | POA: Diagnosis not present

## 2016-11-26 DIAGNOSIS — K5641 Fecal impaction: Secondary | ICD-10-CM | POA: Diagnosis not present

## 2016-11-26 DIAGNOSIS — I482 Chronic atrial fibrillation: Secondary | ICD-10-CM | POA: Diagnosis not present

## 2016-11-26 DIAGNOSIS — M6281 Muscle weakness (generalized): Secondary | ICD-10-CM | POA: Diagnosis not present

## 2016-11-26 DIAGNOSIS — R109 Unspecified abdominal pain: Secondary | ICD-10-CM | POA: Diagnosis not present

## 2016-11-26 DIAGNOSIS — F411 Generalized anxiety disorder: Secondary | ICD-10-CM | POA: Diagnosis not present

## 2016-11-26 DIAGNOSIS — E876 Hypokalemia: Secondary | ICD-10-CM | POA: Diagnosis not present

## 2016-11-26 DIAGNOSIS — I10 Essential (primary) hypertension: Secondary | ICD-10-CM | POA: Diagnosis not present

## 2016-11-26 LAB — GLUCOSE, CAPILLARY
GLUCOSE-CAPILLARY: 141 mg/dL — AB (ref 65–99)
Glucose-Capillary: 115 mg/dL — ABNORMAL HIGH (ref 65–99)

## 2016-11-26 MED ORDER — TAMSULOSIN HCL 0.4 MG PO CAPS: 0.4000 mg | ORAL_CAPSULE | Freq: Every day | ORAL | Status: AC

## 2016-11-26 MED ORDER — DIGOXIN 125 MCG PO TABS: 0.1250 mg | ORAL_TABLET | Freq: Every day | ORAL | Status: DC

## 2016-11-26 MED ORDER — METOPROLOL TARTRATE 75 MG PO TABS: 75.0000 mg | ORAL_TABLET | Freq: Two times a day (BID) | ORAL | Status: DC

## 2016-11-26 MED ORDER — METHOCARBAMOL 500 MG PO TABS: 500.0000 mg | ORAL_TABLET | Freq: Three times a day (TID) | ORAL | Status: DC | PRN

## 2016-11-26 MED ORDER — HYDROCORTISONE 2.5 % RE CREA: TOPICAL_CREAM | Freq: Two times a day (BID) | RECTAL | 0 refills | Status: DC | PRN

## 2016-11-26 MED ORDER — POLYETHYLENE GLYCOL 3350 17 G PO PACK: 17.0000 g | PACK | Freq: Every day | ORAL | Status: DC | PRN

## 2016-11-26 MED ORDER — ENSURE ENLIVE PO LIQD: 237.0000 mL | Freq: Four times a day (QID) | ORAL | Status: DC

## 2016-11-26 MED ORDER — OXYCODONE HCL 5 MG PO TABS: 5.0000 mg | ORAL_TABLET | ORAL | 0 refills | Status: DC | PRN

## 2016-11-26 MED ORDER — BUSPIRONE HCL 7.5 MG PO TABS: 7.5000 mg | ORAL_TABLET | Freq: Three times a day (TID) | ORAL | Status: DC

## 2016-11-26 MED ORDER — ASPIRIN 81 MG PO TBEC: 81.0000 mg | DELAYED_RELEASE_TABLET | Freq: Every day | ORAL | Status: DC

## 2016-11-26 MED ORDER — ZINC OXIDE 40 % EX OINT: TOPICAL_OINTMENT | CUTANEOUS | Status: DC | PRN

## 2016-11-26 NOTE — Clinical Social Work Note (Signed)
Health[employer] Adv authorization obtained: [id number]  SNF notified.   Glendon Axe, MSW [phone] 11/26/2016 1:20 PM

## 2016-11-26 NOTE — Discharge Summary (Signed)
Physician Discharge Summary  [name] [id number] DOB: 1951/04/25 DOA: 11/08/2016  PCP: Sandi Mariscal, MD  Admit date: 11/08/2016 Discharge date: 11/26/2016  Time spent: 30 minutes  Recommendations for Outpatient Follow-up:  1. Repeat BMET in 1 week to follow electrolytes and renal function 2. Please repeat CBC in 1 Week to follow Hgb trend  3. Please check digoxin level in 5 days 4. Reassess BP and adjust medications as needed   Discharge Diagnoses:  Principal Problem:   Constipation Active Problems:   Essential hypertension, benign   Atrial fibrillation (HCC)   Diabetes mellitus type 2 in obese (HCC)   Hyponatremia   Urinary tract infection without hematuria   Hypokalemia   CKD (chronic kidney disease) stage 3, GFR 30-59 ml/min   Hyperbilirubinemia   Homelessness   Protein-calorie malnutrition, severe   Impaction of colon (HCC)   Abdominal pain   Generalized weakness   Fecal impaction (HCC)   AKI (acute kidney injury) (HCC)   Abnormal CT scan, colon   Benign neoplasm of cecum   Polyp of cecum   Discharge Condition: stable and improved. Patient discharge to SNF for further care and rehabilitation. Follow up with general surgery in 2 weeks.  Diet recommendation: heart healthy and modified carbohydrates   Filed Weights   11/09/16 2012 11/17/16 1344  Weight: 93.4 kg (205 lb 14.6 oz) 93 kg (205 lb)    History of present illness:  As per H&P written by Dr. Lorin Mercy on 11/08/16 65 y.o. male with medical history significant ofA. Fib not on anticoagulation, DM Type 2, HTN, HLD, depression - he is not taking medication for any of these problems and has not seen a physician in 2 years.  Patient with severe constipation, malnutrition, having trouble walking, having trouble keeping energy.  Right arm partially immobile - ?pinched nerve in neck for months.  "I let myself go" - car messed up, was paranoid about driving, stopped going to grocery and getting groceries.  Stopped  eating.  Then started ordering pizza every couple of days.  1 BM in the last few months.  Lost over 100 pounds several months ago from not eating.  Mild dysuria for the past 2 days.  Hospital Course:   1)Cecal Mass/Fecal Impaction/Constipation- underwent Colonoscopy on 11/17/2016, constipation/Fecal impaction appears to have Resolved.  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  -Rectal ulcer shows erosive ulcer and reactive changes.   -GI Recommended miralax 2 times daily for constipation and repeat colonoscopy in 2 years for Surveillance with Dr. Henrene Pastor.  -Patient had Lap assisted partial colectomy on 11/22/16 and has now recovered properly from returning of his bowel function. -per general surgery will need staples removed in 7-10 days and follow up at the office in 2 weeks -continue PRN pain meds and avoid constipation  2) A. fib with RVR- CHADVASC2 score is 2, refuses anticoagulation and had history of noncompliance in the past. -will continue metoprolol and digoxin as per cardiology recommendations -Echocardiogram was not optimal as cardiac structures were not visualized at all.  -Recommended TEE to assess the cardiac structures if clinically indicated.  -Currently rate controlled.  -will discharge on ASA  3)H/o Glucose intolerance-most recent A1c is 5.2, avoid overaggressive control in order to avoid life-threatening hypoglycemia.  -not anticipating medications at discharge -Plan is for diet control at discharge  4) BPH with urinary retention -tolerated voiding trial -good urine output  -continue flomax  5)AKI on CKD (patient  with chronic renal failure stage 2-3 at baseline)- resolved, creatinine is back to normal 0.5, most likely was due to BPH with urinary retention and dehydration in the setting of presumed UTI.  -Urine culture failed to isolate any microorganism and patient completed  antibiotics therapy inpatient. -now with good urine output -will continue flomax -no dysuria at discharge   6)Severe Protein Caloric malnutrition -appreciate assistance from nutritional service  -continue Nutritional supplements (ensure QID) -prealbumin 12.5  6) anemia; macrocytic,. Hemoglobin is around 10.2.  -Will recommend CBC intermittently to monitor trend.   7)Social/Dispo- patient with deconditioning and debility, patient is essentially homeless. -will discharge to SNF for physical rehabilitation and further care   Consults  :   Gastroenterology  Surgery.  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 Would recommend TEE to assess cardiac structure and function if deemed clinically indicated.    Consultations:  Gastroenterology   General surgery   Cardiology   Discharge Exam: Vitals:   11/25/16 2241 11/26/16 0644  BP: 102/68 123/82  Pulse: (!) 131 (!) 120  Resp: 18 18  Temp: 98.8 F (37.1 C) 98 F (36.7 C)   Gen: Alert, NAD, pleasant Card: Irregular, tachycardic Pulm: CTAB, no W/R/R Abd: Soft, globally tender and hypersensitive, nondistended, +BS, multiple lap incisions cdi and midline abdominal incision cdi with staples in place and no drainage Extremities: no cyanosis, no clubbing and just trace edema bilaterally   Discharge Instructions   Discharge Instructions    Diet - low sodium heart healthy    Complete by:  As directed    Discharge instructions    Complete by:  As directed    Heart healthy diet recommended  PRN miralax for moderate constipation (hold for diarrhea) Assure adequate hydration  Take medications as prescribed Remove abd staples in 7-10 days Follow up with general surgery in 2 weeks Physical  rehabiliation and further care as per SNF protocol     Current Discharge Medication List    START taking these medications   Details  aspirin EC 81 MG EC tablet Take 1 tablet (81 mg total) by mouth daily.    busPIRone (BUSPAR) 7.5 MG tablet Take 1 tablet (7.5 mg total) by mouth 3 (three) times daily.    digoxin (LANOXIN) 0.125 MG tablet Take 1 tablet (0.125 mg total) by mouth daily.    feeding supplement, ENSURE ENLIVE, (ENSURE ENLIVE) LIQD Take 237 mLs by mouth 4 (four) times daily.    hydrocortisone (ANUSOL-HC) 2.5 % rectal cream Place rectally every 12 (twelve) hours as needed for hemorrhoids or itching. Qty: 30 g, Refills: 0    liver oil-zinc oxide (DESITIN) 40 % ointment Apply topically as needed for irritation.    methocarbamol (ROBAXIN) 500 MG tablet Take 1 tablet (500 mg total) by mouth every 8 (eight) hours as needed for muscle spasms.    oxyCODONE (OXY IR/ROXICODONE) 5 MG immediate release tablet Take 1-2 tablets (5-10 mg total) by mouth every 4 (four) hours as needed for moderate pain. Qty: 30 tablet, Refills: 0    polyethylene glycol (MIRALAX) packet Take 17 g by mouth daily as needed for moderate constipation.    tamsulosin (FLOMAX) 0.4 MG CAPS capsule Take 1 capsule (0.4 mg total) by mouth daily.      CONTINUE these medications which have CHANGED  Details  Metoprolol Tartrate 75 MG TABS Take 75 mg by mouth 2 (two) times daily.      CONTINUE these medications which have NOT CHANGED   Details  Multiple Vitamin (MULTIVITAMIN) capsule Take 1 capsule by mouth daily.    ranitidine (ZANTAC) 150 MG capsule Take 150 mg by mouth as needed for heartburn.      STOP taking these medications     buPROPion (WELLBUTRIN XL) 150 MG 24 hr tablet      cyanocobalamin 500 MCG tablet      Omega 3 1200 MG CAPS      OVER THE COUNTER MEDICATION      OVER THE COUNTER MEDICATION      apixaban (ELIQUIS) 5 MG TABS tablet      furosemide (LASIX) 40 MG tablet      glipiZIDE  (GLUCOTROL XL) 10 MG 24 hr tablet      losartan (COZAAR) 25 MG tablet      metFORMIN (GLUCOPHAGE) 1000 MG tablet      potassium chloride SA (K-DUR,KLOR-CON) 20 MEQ tablet      pravastatin (PRAVACHOL) 40 MG tablet      sildenafil (VIAGRA) 100 MG tablet      terbinafine (LAMISIL) 250 MG tablet        Allergies  Allergen Reactions  . Other     [name] bandage (self-gripping) - erythema, itching    Contact information for follow-up providers    ROSENBOWER,TODD J, MD. Schedule an appointment as soon as possible for a visit in 2 week(s).   Specialty:  General Surgery Contact information: [place] [address] [phone]            Contact information for after-discharge care    Destination    [facility] Follow up.   Specialty:  [place] information: [address] [place] [phone]                  The results of significant diagnostics from this hospitalization (including imaging, microbiology, ancillary and laboratory) are listed below for reference.    Significant Diagnostic Studies: Dg Abdomen 1 View  Result Date: 11/08/2016 CLINICAL DATA:  Constipation over the last month. EXAM: ABDOMEN - 1 VIEW COMPARISON:  None. FINDINGS: Gas pattern does not suggest ileus or obstruction. Amount of fecal matter in the colon is within normal limits with the exception of slightly prominent stool in the rectal vault. No significant soft tissue calcifications. Some vascular calcification noted. Chronic degenerative changes affect the spine. IMPRESSION: No markedly abnormal findings. Overall amount of fecal matter appears within normal limits. Slightly prominent stool in the rectal vault. Electronically Signed   By: Nelson Chimes M.D.   On: 11/08/2016 14:29   Ct Head Wo Contrast  Result Date: 11/08/2016 CLINICAL DATA:  Generalized weakness. Poor historian.  Difficulty raising right arm. EXAM: CT HEAD WITHOUT CONTRAST TECHNIQUE: Contiguous axial images were obtained from the base of the skull through the vertex without intravenous contrast. COMPARISON:  None. FINDINGS: Brain: Ventricles and cisterns are within normal. There is minimal prominence of the CSF spaces compatible with age related atrophy. There is no mass, mass effect, shift of midline structures or acute hemorrhage. No evidence of acute infarction. Vascular: Minimal calcified plaque over the cavernous segment of the internal carotid arteries. Skull: Within normal. Sinuses/Orbits: Within normal. IMPRESSION: No acute intracranial findings. Electronically Signed   By: Marin Olp  M.D.   On: 11/08/2016 15:56   Ct Abdomen Pelvis W Contrast  Addendum Date: 11/15/2016   ADDENDUM REPORT: 11/15/2016 08:56 ADDENDUM: Case reviewed prior to barium enema. There is sessile and lobulated mural filling defect in the cecum on the right lateral wall measuring 3 cm in diameter by 12 mm in thickness. While adherent stool can give this appearance, finding is more concerning for a mass. Case discussed with Dr. Fuller Plan via telephone at time of addendum. Patient is under consideration for colonoscopy. Electronically Signed   By: Monte Fantasia M.D.   On: 11/15/2016 08:56   Result Date: 11/15/2016 CLINICAL DATA:  Initial evaluation for weakness, weight loss, constipation. EXAM: CT ABDOMEN AND PELVIS WITH CONTRAST TECHNIQUE: Multidetector CT imaging of the abdomen and pelvis was performed using the standard protocol following bolus administration of intravenous contrast. CONTRAST:  1105mL ISOVUE-300 IOPAMIDOL (ISOVUE-300) INJECTION 61% COMPARISON:  Prior radiograph from earlier the same day. FINDINGS: Lower chest: Mild scattered atelectatic changes present within the visualized lung bases. Visualized lungs are otherwise clear. No pleural or pericardial fusion. Hepatobiliary: Liver demonstrates a normal contrast enhanced  appearance. Gallbladder within normal limits. No biliary dilatation. Pancreas: Pancreas within normal limits. Spleen: Scattered hypodense lesions noted within the spleen, largest of which measures 9 mm. These are indeterminate, but are of doubtful clinical significance. Spleen otherwise unremarkable. Adrenals/Urinary Tract: 2.3 cm hypodense nodule noted within the right adrenal gland, most likely a small adenoma. Left adrenal gland within normal limits. Kidneys equal in size with symmetric enhancement. Scattered parapelvic cyst noted within the left kidney. Few scattered calcifications within the left renal hilum suspicious for small nonobstructive calculi. These measure up to 3 mm. No hydronephrosis. No focal enhancing renal mass. Scattered cortical thinning/scarring noted. Ureters are of normal caliber without acute abnormality. Bladder within normal limits. Stomach/Bowel: Small hiatal hernia noted. Stomach otherwise unremarkable. Small bowel within normal limits for caliber without evidence for obstruction. Colonic diverticulosis without evidence for acute diverticulitis. Appendix not visualize, compatible with history of prior appendectomy. No abnormal wall thickening, mucosal enhancement, or inflammatory fat stranding seen about the bowels. Large amount of retained stool within the rectal vault, suggesting constipation. Vascular/Lymphatic: Normal intravascular enhancement seen throughout the intra-abdominal aorta and its branch vessels. Moderate aorto bi-iliac atherosclerotic disease. No aneurysm. No pathologically enlarged intra-abdominal or pelvic lymph nodes. Reproductive: Prostate within normal limits. Other: No free air or fluid. Small bilateral fat containing inguinal hernias noted. Musculoskeletal: No acute osseous abnormality. No worrisome lytic or blastic osseous lesions. Moderate multilevel degenerative spondylolysis noted within the visualized spine. IMPRESSION: 1. No CT evidence for acute  intra-abdominal or pelvic process. 2. Large amount of retained stool within the rectal vault, suggesting constipation. 3. Sigmoid diverticulosis without evidence for acute diverticulitis. 4. Question punctate nonobstructive left renal nephrolithiasis. Electronically Signed: By: Jeannine Boga M.D. On: 11/08/2016 18:08   Dg Abd 2 Views  Result Date: 11/10/2016 CLINICAL DATA:  Acute generalized abdominal pain. EXAM: ABDOMEN - 2 VIEW COMPARISON:  CT scan of November 08, 2016. FINDINGS: The bowel gas pattern is normal. There is no evidence of free air. No radio-opaque calculi or other significant radiographic abnormality is seen. Stool is noted in the rectum. IMPRESSION: No definite evidence of bowel obstruction or ileus. Electronically Signed   By: Marijo Conception, M.D.   On: 11/10/2016 15:53   Dg Colon Therapeutic W/cm  Result Date: 11/12/2016 CLINICAL DATA:  Severe constipation. EXAM: WATER SOLUBLE CONTRAST ENEMA TECHNIQUE: Approximately 3 L of warm water mixed with  480 cc of Gastrografin was instilled per rectum. FLUOROSCOPY TIME:  Radiation Exposure Index (if provided by the fluoroscopic device): 183.47 mGy COMPARISON:  Radiographs dated 11/10/2016 FINDINGS: EKG knee demonstrates moderate stool in the ascending colon as well as in the rectum. The study shows a 6 mm stool ball in the rectum. There is also stool throughout the remainder of the colon particularly in the cecum. There are few diverticula in the sigmoid portion the colon. The study was suboptimal due to patient's inability to move well during the exam. Multiple smaller stool balls throughout the colon. I was able to get some contrast into the ascending colon. There are no discrete strictures or definitive mass lesions. The patient was unable to sit up to evacuate the contrast at the end of the exam. IMPRESSION: Extensive stool in the distal colon as well as in the ascending colon. No dilatation of the colon. Scattered diverticula in the  sigmoid portion of the colon. No discrete obstruction or mass lesion. However, the study is suboptimal for detail because of the patient's inability to retain the contrast or move well during the exam. Electronically Signed   By: Lorriane Shire M.D.   On: 11/12/2016 12:46    Microbiology: Recent Results (from the past 240 hour(s))  Surgical pcr screen     Status: Abnormal   Collection Time: 11/19/16  7:41 AM  Result Value Ref Range Status   MRSA, PCR POSITIVE (A) NEGATIVE Final    Comment: RESULT CALLED TO, READ BACK BY AND VERIFIED WITH: PICKETT,S. RN @1111  ON 12.8.17 BY [email or web address]    Staphylococcus aureus POSITIVE (A) NEGATIVE Final    Comment:        The Xpert SA Assay (FDA approved for NASAL specimens in patients over 86 years of age), is one component of a comprehensive surveillance program.  Test performance has been validated by [facility] for patients greater than or equal to 45 year old. It is not intended to diagnose infection nor to guide or monitor treatment.      Labs: Basic Metabolic Panel:  Recent Labs Lab 11/22/16 0450 11/23/16 1133 11/25/16 0458  NA 137 137 133*  K 4.0 4.3 4.0  CL 104 105 102  CO2 26 26 26   GLUCOSE 103* 105* 115*  BUN 8 9 7   CREATININE 0.58* 0.45* 0.49*  CALCIUM 7.9* 7.5* 7.4*   CBC:  Recent Labs Lab 11/22/16 0450 11/23/16 1133 11/25/16 0458  WBC 4.5 5.7 4.0  HGB 9.5* 10.2* 10.2*  HCT 28.2* 30.5* 30.5*  MCV 98.3 102.0* 102.3*  PLT 221 247 240   CBG:  Recent Labs Lab 11/25/16 1119 11/25/16 1711 11/25/16 2239 11/26/16 0743 11/26/16 1207  GLUCAP 154* 127* 176* 115* 141*    Signed:  Barton Dubois MD.  [facility] 11/26/2016, 1:55 PM

## 2016-11-26 NOTE — Progress Notes (Signed)
Report called to Christiana Gurevich at [facility]. Pt discharged to SNF in stable condition transported by [facility] at 1625.  Coolidge Breeze, RN 11/26/2016

## 2016-11-26 NOTE — Progress Notes (Signed)
Occupational Therapy Treatment Patient Details Name: [name] MRN: [id number] DOB: [birth year] Today's Date: 11/26/2016    History of present illness  [name] is a 65 y.o. male with medical history significant of A. Fib not on anticoagulation, DM Type 2, HTN, HLD, depression - he is not taking medication for any of these problems and has not seen a physician in 2 years.  Patient with severe constipation, malnutrition, having trouble walking, having trouble keeping energy.  Right arm partially immobile - ?pinched nerve in neck for months.  Abnormal lesion in cecum on original CT.  S/p lap colon resection 12/11   OT comments  Pt is self limiting, anxious. Pt requires increased encouragement to participate.  Pt eventually agreeable to sit on [facility] and then requested back to bed, refusing further ambulation.  MD in room beginning of session and reported pt to d/c to SNF today which required increased time for reasoning and explanations from MD since pt reluctant to leave hospital.  Pt also requires increased time for encouragement and mobility in general.  Reiterated plan for SNF rehabilitation and that pt needs to participate to continue progressing with medical goals (s/p lap colon resection) as well as improve functional outcomes. MD stated that pt will d/c to SNF this afternoon  Follow Up Recommendations  SNF    Equipment Recommendations  None recommended by OT    Recommendations for Other Services      Precautions / Restrictions Precautions Precautions: Fall Precaution Comments: recent ABD surgery Restrictions Weight Bearing Restrictions: No       Mobility Bed Mobility Overal bed mobility: Needs Assistance Bed Mobility: Sit to Supine       Sit to supine: Mod assist;HOB elevated   General bed mobility comments: cued to perform sidelying however pt does not perform this, trunk descended and pt requires assist for LEs onto bed  Transfers Overall transfer level: Needs  assistance Equipment used: Rolling walker (2 wheeled) Transfers: Sit to/from [facility] Sit to Stand: Mod assist;+2 physical assistance;+2 safety/equipment Stand pivot transfers: Min guard;+2 safety/equipment       General transfer comment: mod assist +2 from recliner, pt attempted and unable to boost fully upright requiring assist, improved to heavy min assist from [facility] pt took a few steps over to [facility] and then to bed    Balance Overall balance assessment: No apparent balance deficits (not formally assessed)                                 ADL       Grooming: Wash/dry face;Wash/dry hands;Set up;Sitting   Upper Body Bathing: Minimal assistance;Sitting   Lower Body Bathing: Total assistance   Upper Body Dressing : Minimal assistance;Sitting   Lower Body Dressing: Total assistance   Toilet Transfer: +2 for safety/equipment;+2 for physical assistance;Moderate assistance;BSC;RW   Toileting- Clothing Manipulation and Hygiene: Sit to/from stand;Total assistance       Functional mobility during ADLs: Moderate assistance;+2 for safety/equipment;+2 for physical assistance;Rolling walker                                        Cognition   Behavior During Therapy: Anxious Overall Cognitive Status: Within Functional Limits for tasks assessed  Extremity/Trunk Assessment   generalized weakness                        General Comments  pt not very motivated    Pertinent Vitals/ Pain       Pain Assessment: Faces Pain Score: 4  Faces Pain Scale: Hurts little more Pain Location: abdomen Pain Descriptors / Indicators: Grimacing;Moaning Pain Intervention(s): Limited activity within patient's tolerance;Monitored during session;Premedicated before session;Repositioned                                                          Frequency  Min 2X/week        Progress  Toward Goals  OT Goals(current goals can now be found in the care plan section)     Acute Rehab OT Goals Patient Stated Goal: "get my bowels under control"  Plan Discharge plan remains appropriate    Co-evaluation      Reason for Co-Treatment: For patient/therapist safety;To address functional/ADL transfers PT goals addressed during session: Mobility/safety with mobility OT goals addressed during session: ADL's and self-care;Proper use of Adaptive equipment and DME      End of Session Equipment Utilized During Treatment: Rolling walker;Other (comment) (BSC)   Activity Tolerance Patient limited by fatigue;Patient limited by pain;Other (comment) (anxious)   Patient Left in bed;with call bell/phone within reach   Nurse Communication          Time: [id number] OT Time Calculation (min): 32 min  Charges: OT General Charges $OT Visit: 1 Procedure OT Treatments $Self Care/Home Management : 8-22 mins  Britt Bottom 11/26/2016, 1:10 PM

## 2016-11-26 NOTE — Clinical Social Work Placement (Signed)
Medical Social Worker facilitated patient discharge including contacting patient family and facility to confirm patient discharge plans.  Clinical information faxed to facility and family agreeable with plan.  MSW arranged ambulance transport via [place] to [facility].  RN to call report prior to discharge.  Medical Social Worker will sign off for now as social work intervention is no longer needed. Please consult [place] again if new need arises.  CLINICAL SOCIAL WORK PLACEMENT  NOTE  Date:  11/26/2016  Patient Details  Name: [name] MRN: [id number] Date of Birth: 01/07/1951  Clinical Social Work is seeking post-discharge placement for this patient at the [place] level of care (*CSW will initial, date and re-position this form in  chart as items are completed):  Yes   Patient/family provided with [place] Work Department's list of facilities offering this level of care within the geographic area requested by the patient (or if unable, by the patient's family).  Yes   Patient/family informed of their freedom to choose among providers that offer the needed level of care, that participate in Medicare, Medicaid or managed care program needed by the patient, have an available bed and are willing to accept the patient.  Yes   Patient/family informed of [facility]'s ownership interest in [facility] and [facility], as well as of the fact that they are under no obligation to receive care at these facilities.  PASRR submitted to EDS on 11/10/16     PASRR number received on 11/10/16     Existing PASRR number confirmed on       FL2 transmitted to all facilities in geographic area requested by pt/family on 11/10/16     FL2 transmitted to all facilities within larger geographic area on       Patient informed that his/her managed care company has contracts with or will negotiate with certain facilities, including the following:         Yes   Patient/family informed of bed offers received.  Patient chooses bed at  [facility] )     Physician recommends and patient chooses bed at      Patient to be transferred to  [facility] ) on 11/26/16.  Patient to be transferred to facility by  [facility] )     Patient family notified on 11/26/16 of transfer.  Name of family member notified:   (Pt to notify family (sister and friend))     PHYSICIAN Please prepare priority discharge summary, including medications     Additional Comment:    _______________________________________________ Glendon Axe A 11/26/2016, 2:21 PM

## 2016-11-26 NOTE — Progress Notes (Signed)
Physical Therapy Treatment Patient Details Name: Ramirez Prawl MRN: [id number] DOB: 08/17/1951 Today's Date: 11/26/2016    History of Present Illness  Zephan Drobnick is a 65 y.o. male with medical history significant of A. Fib not on anticoagulation, DM Type 2, HTN, HLD, depression - he is not taking medication for any of these problems and has not seen a physician in 2 years.  Patient with severe constipation, malnutrition, having trouble walking, having trouble keeping energy.  Right arm partially immobile - ?pinched nerve in neck for months.  Abnormal lesion in cecum on original CT.  S/p lap colon resection 12/11    PT Comments    Pt requires increased encouragement to participate.  Pt eventually agreeable to sit on [facility] and then requested back to bed, refusing further ambulation.  MD in room beginning of session and reported pt to d/c to SNF today which required increased time for reasoning and explanations from MD since pt reluctant to leave hospital.  Pt also requires increased time for encouragement and mobility in general.  Reiterated plan for SNF rehabilitation and that pt needs to participate to continue progressing with medical goals (s/p lap colon resection) as well as improve functional outcomes.    Follow Up Recommendations  SNF;Supervision for mobility/OOB     Equipment Recommendations  None recommended by PT    Recommendations for Other Services       Precautions / Restrictions Precautions Precautions: Fall Precaution Comments: recent ABD surgery    Mobility  Bed Mobility Overal bed mobility: Needs Assistance Bed Mobility: Sit to Supine       Sit to supine: Mod assist;HOB elevated   General bed mobility comments: cued to perform sidelying however pt does not perform this, trunk descended and pt requires assist for LEs onto bed  Transfers Overall transfer level: Needs assistance Equipment used: Rolling walker (2 wheeled) Transfers: Sit to/from [employer] Sit to Stand: Mod assist;+2 physical assistance;+2 safety/equipment Stand pivot transfers: Min guard;+2 safety/equipment       General transfer comment: mod assist +2 from recliner, pt attempted and unable to boost fully upright requiring assist, improved to heavy min assist from [place], pt took a few steps over to [facility] and then to bed  Ambulation/Gait                 Stairs            Wheelchair Mobility    Modified Rankin (Stroke Patients Only)       Balance                                    Cognition Arousal/Alertness: Awake/alert Behavior During Therapy: Anxious Overall Cognitive Status: Within Functional Limits for tasks assessed                      Exercises      General Comments        Pertinent Vitals/Pain Pain Assessment: Faces Faces Pain Scale: Hurts little more Pain Location: abdomen Pain Descriptors / Indicators: Grimacing;Moaning Pain Intervention(s): Repositioned;Limited activity within patient's tolerance;Monitored during session;Premedicated before session    Home Living                      Prior Function            PT Goals (current goals can now be found in the care plan section)  Progress towards PT goals: Progressing toward goals    Frequency    Min 3X/week      PT Plan Current plan remains appropriate    Co-evaluation PT/OT/SLP Co-Evaluation/Treatment: Yes Reason for Co-Treatment: For patient/therapist safety;To address functional/ADL transfers PT goals addressed during session: Mobility/safety with mobility OT goals addressed during session: ADL's and self-care     End of Session   Activity Tolerance: Other (comment) (self limiting) Patient left: in bed;with call bell/phone within reach     Time: [id number] PT Time Calculation (min) (ACUTE ONLY): 41 min  Charges:  $Therapeutic Activity: 8-22 mins                    G Codes:      Neomi Laidler,KATHrine E 20-Dec-2016,  12:55 PM Carmelia Bake, PT, DPT 12-20-16 Pager: [phone]

## 2016-11-26 NOTE — Progress Notes (Signed)
Patient ID: [name], male   DOB: April 04, 1951, 65 y.o.   MRN: [id number]  [facility] Progress Note  4 Days Post-Op  Subjective: Progressing well. Tolerating small amounts of soft diet. Had multiple loose BM's yesterday.  States that he continues to have severe pain. Abdominal pain is worse with sitting up in chair for prolonged periods of time. States that he feels weak.  Objective: Vital signs in last 24 hours: Temp:  [97.7 F (36.5 C)-98.8 F (37.1 C)] 98 F (36.7 C) (12/15 0644) Pulse Rate:  [87-131] 120 (12/15 0644) Resp:  [18] 18 (12/15 0644) BP: (99-123)/(57-82) 123/82 (12/15 0644) SpO2:  [96 %-98 %] 97 % (12/15 0644) Last BM Date: 11/25/16  Intake/Output from previous day: 12/14 0701 - 12/15 0700 In: 2585 [P.O.:480; I.V.:2105] Out: 1350 [Urine:1350] Intake/Output this shift: No intake/output data recorded.  PE: Gen: Alert, NAD, pleasant Card: Irregular, tachycardic Pulm: CTAB, no W/R/R Abd: Soft, globally tender and hypersensitive, nondistended, +BS, multiple lap incisions cdi and midline abdominal incision cdi with staples in place and no drainage  Lab Results:   Recent Labs  11/23/16 1133 11/25/16 0458  WBC 5.7 4.0  HGB 10.2* 10.2*  HCT 30.5* 30.5*  PLT 247 240   BMET  Recent Labs  11/23/16 1133 11/25/16 0458  NA 137 133*  K 4.3 4.0  CL 105 102  CO2 26 26  GLUCOSE 105* 115*  BUN 9 7  CREATININE 0.45* 0.49*  CALCIUM 7.5* 7.4*   PT/INR No results for input(s): LABPROT, INR in the last 72 hours. CMP     Component Value Date/Time   NA 133 (L) 11/25/2016 0458   K 4.0 11/25/2016 0458   CL 102 11/25/2016 0458   CO2 26 11/25/2016 0458   GLUCOSE 115 (H) 11/25/2016 0458   BUN 7 11/25/2016 0458   CREATININE 0.49 (L) 11/25/2016 0458   CREATININE 1.10 08/02/2014 1505   CALCIUM 7.4 (L) 11/25/2016 0458   PROT 4.7 (L) 11/12/2016 0430   ALBUMIN 2.3 (L) 11/12/2016 0430   AST 11 (L) 11/12/2016 0430   ALT 7 (L) 11/12/2016 0430   ALKPHOS 48 11/12/2016 0430   BILITOT 0.6 11/12/2016 0430   GFRNONAA >60 11/25/2016 0458   GFRNONAA 71 08/02/2014 1505   GFRAA >60 11/25/2016 0458   GFRAA 82 08/02/2014 1505   Lipase     Component Value Date/Time   LIPASE 25 11/08/2016 1426       Studies/Results: No results found.  Anti-infectives: Anti-infectives    Start     Dose/Rate Route Frequency Ordered Stop   11/19/16 0600  cefoTEtan (CEFOTAN) 2 g in dextrose 5 % 50 mL IVPB     2 g 100 mL/hr over 30 Minutes Intravenous On call to O.R. 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 Zella Reese - POD 4 - surgical path pending  A fib - on metoprolol and digoxin per cardiology Glucose intolerance BPH Severe protein calorie malnutrition - prealbumin 12.5 (11/25/16) Homeless  VTE - SCDs, Lovenox FEN - regular diet  Plan - Bowels functioning. Advance to regular diet. Continue to encourage PT/mobilization/sitting up in chair and out of bed multiple times daily. From surgical standpoint patient would be ready for discharge with rehab (planning on SNF at discharge). He will need staples removed 7-10 day postop, and follow-up with surgeon in about 2 weeks.   LOS: 18 days    [name] , [facility] 11/26/2016, 9:05 AM Pager: [phone] Consults:  [phone] Mon-Fri 7:00 am-4:30 pm Sat-Sun 7:00 am-11:30 am

## 2016-11-28 DIAGNOSIS — K626 Ulcer of anus and rectum: Secondary | ICD-10-CM

## 2016-11-28 HISTORY — DX: Ulcer of anus and rectum: K62.6

## 2016-11-30 ENCOUNTER — Encounter: Payer: Self-pay | Admitting: Internal Medicine

## 2016-11-30 ENCOUNTER — Non-Acute Institutional Stay (SKILLED_NURSING_FACILITY): Payer: PPO | Admitting: Internal Medicine

## 2016-11-30 DIAGNOSIS — E1169 Type 2 diabetes mellitus with other specified complication: Secondary | ICD-10-CM

## 2016-11-30 DIAGNOSIS — I10 Essential (primary) hypertension: Secondary | ICD-10-CM

## 2016-11-30 DIAGNOSIS — Z9049 Acquired absence of other specified parts of digestive tract: Secondary | ICD-10-CM | POA: Diagnosis not present

## 2016-11-30 DIAGNOSIS — N400 Enlarged prostate without lower urinary tract symptoms: Secondary | ICD-10-CM | POA: Insufficient documentation

## 2016-11-30 DIAGNOSIS — E669 Obesity, unspecified: Secondary | ICD-10-CM | POA: Diagnosis not present

## 2016-11-30 DIAGNOSIS — N183 Chronic kidney disease, stage 3 unspecified: Secondary | ICD-10-CM

## 2016-11-30 DIAGNOSIS — I482 Chronic atrial fibrillation, unspecified: Secondary | ICD-10-CM

## 2016-11-30 DIAGNOSIS — E43 Unspecified severe protein-calorie malnutrition: Secondary | ICD-10-CM

## 2016-11-30 DIAGNOSIS — N401 Enlarged prostate with lower urinary tract symptoms: Secondary | ICD-10-CM

## 2016-11-30 DIAGNOSIS — R5381 Other malaise: Secondary | ICD-10-CM | POA: Diagnosis not present

## 2016-11-30 DIAGNOSIS — K5909 Other constipation: Secondary | ICD-10-CM | POA: Diagnosis not present

## 2016-11-30 DIAGNOSIS — N138 Other obstructive and reflux uropathy: Secondary | ICD-10-CM

## 2016-11-30 HISTORY — DX: Other obstructive and reflux uropathy: N40.1

## 2016-11-30 NOTE — Progress Notes (Signed)
Patient ID: [name], male   DOB: [birth year], 65 y.o.   MRN: [id number]    HISTORY AND PHYSICAL   DATE: 11/30/16  Location:    [facility]   Place of Service: SNF (31)   Extended Emergency Contact Information Primary Emergency Contact: Kinton,Randi  [place] [employer] [place] Phone: [phone] Relation: Friend  Advanced Directive information  FULL CODE; MOST FORM ON CHART -NO FT  Chief Complaint  Patient presents with  . New Admit To SNF    HPI:  65 yo male seen today as a new admission into SNF following prolonged hospital stay for cecal mass/fecal impaction/constipation, afib with RVR not on anticoagulation due to pt refusal, BPH with urinary retention, hx glucose intolerance, A/CKD stage 3, severe protein calorie malnutrition, anemia, UTI, homelessness, DM, depression. 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 Sx then performed lap assisted partial colectomy on 11/22/16. Path results revealed "PREDOMINANTLY INTRAMUCOSAL ADENOCARCINOMA OF THE COLON, GRADE 1 , BACKGROUND TUBULOVILLOUS ADENOMA (5 CM); THE TUMOR FOCALLY INVADES SUBMUCOSA (PT1); ALL MARGINS OF RESECTION ARE NEGATIVE FOR CARCINOMA; FIFTEEN BENIGN LYMPH NODES (0/15); TUBULAR ADENOMA (X3)". Hgb 10.2; Na 128-->133; K 3.3-->4; Cr 1.36-->0.49; albumin 2.3; Tbili 2.1-->0.6 at d/c. He presents to SNF for short term rehab.  He reports increased gas but no real stool pdtn. He has abdominal discomfort. No f/c. He has pain with PT/OT. He reports feeling weakness in b/l legs even prior to colon sx. No other concerns. No nursing issues. No falls. appetite reduced. Sleeping well. Pain controlled on roxicodone  Hx glucose intolerance - A1c 5.2%  BPH with urinary retention - stable on flomax  PAF - rate controlled on metoprolol and digoxin. Pt refused anticoagulant in the past. He was d/c'd on ASA  CKD -  stage 2-3 in the past. Cr 0.5 at d/c  Severe protein calorie malnutrition - albumin 2.3 with prealbumin of 12.5. Currently on nutritional supplements  Past Medical History:  Diagnosis Date  . Atrial fibrillation ([place])   . Cataract   . Diabetes mellitus without complication ([place])   . Hyperlipidemia   . Hypertension   . Lichen planus    bilateral legs    Past Surgical History:  Procedure Laterality Date  . APPENDECTOMY    . COLON RESECTION N/A 11/22/2016   Procedure: HAND ASSISTED LAPAROSCOPIC COLON RESECTION;  Surgeon: [name], MD;  Location: [facility];  Service: General;  Laterality: N/A;  . COLONOSCOPY N/A 11/17/2016   Procedure: COLONOSCOPY;  Surgeon: [name], MD;  Location: [facility];  Service: Endoscopy;  Laterality: N/A;  . FRACTURE SURGERY    . MULTIPLE TOOTH EXTRACTIONS      Patient Care Team: Sandi Mariscal, MD as PCP - General (Internal Medicine) [name], MD as Consulting Physician (Gastroenterology)  Social History   Social History  . Marital status: Unknown    Spouse name: N/A  . Number of children: N/A  . Years of education: N/A   Occupational History  . unemployed    Social History Main Topics  . Smoking status: Former Smoker    Types: Pipe    Quit date: 09/14/1983  . Smokeless tobacco: Never Used  . Alcohol use No  . Drug use: No  . Sexual activity: No   Other Topics Concern  . Not on file   Social History Narrative  . No narrative on file     reports  that he quit smoking about 33 years ago. His smoking use included Pipe. He has never used smokeless tobacco. He reports that he does not drink alcohol or use drugs.  Family History  Problem Relation Age of Onset  . Brain cancer Mother   . Alzheimer's disease Father   . Cancer Maternal Grandmother   . Heart attack Maternal Grandfather   . Pneumonia Paternal Grandfather    Family Status  Relation Status  . Mother Deceased  . Father Deceased  . Maternal Grandmother Deceased   . Maternal Grandfather Deceased  . Paternal Grandfather Deceased  . Sister Alive  . Brother Alive  . Paternal Grandmother Deceased     There is no immunization history on file for this patient.  Allergies  Allergen Reactions  . Other     [name] bandage (self-gripping) - erythema, itching    Medications: Patient's Medications  New Prescriptions   No medications on file  Previous Medications   ASPIRIN EC 81 MG EC TABLET    Take 1 tablet (81 mg total) by mouth daily.   BUSPIRONE (BUSPAR) 7.5 MG TABLET    Take 1 tablet (7.5 mg total) by mouth 3 (three) times daily.   DIGOXIN (LANOXIN) 0.125 MG TABLET    Take 1 tablet (0.125 mg total) by mouth daily.   FEEDING SUPPLEMENT, ENSURE ENLIVE, (ENSURE ENLIVE) LIQD    Take 237 mLs by mouth 4 (four) times daily.   HYDROCORTISONE (ANUSOL-HC) 2.5 % RECTAL CREAM    Place rectally every 12 (twelve) hours as needed for hemorrhoids or itching.   LIVER OIL-ZINC OXIDE (DESITIN) 40 % OINTMENT    Apply topically as needed for irritation.   METHOCARBAMOL (ROBAXIN) 500 MG TABLET    Take 1 tablet (500 mg total) by mouth every 8 (eight) hours as needed for muscle spasms.   METOPROLOL TARTRATE 75 MG TABS    Take 75 mg by mouth 2 (two) times daily.   MULTIPLE VITAMIN (MULTIVITAMIN) CAPSULE    Take 1 capsule by mouth daily.   OXYCODONE (OXY IR/ROXICODONE) 5 MG IMMEDIATE RELEASE TABLET    Take 1-2 tablets (5-10 mg total) by mouth every 4 (four) hours as needed for moderate pain.   POLYETHYLENE GLYCOL (MIRALAX) PACKET    Take 17 g by mouth daily as needed for moderate constipation.   RANITIDINE (ZANTAC) 150 MG CAPSULE    Take 150 mg by mouth as needed for heartburn.   TAMSULOSIN (FLOMAX) 0.4 MG CAPS CAPSULE    Take 1 capsule (0.4 mg total) by mouth daily.  Modified Medications   No medications on file  Discontinued Medications   No medications on file    Review of Systems  Constitutional: Positive for appetite change.  Gastrointestinal:  Positive for abdominal pain and constipation.  Genitourinary: Positive for difficulty urinating.  Musculoskeletal: Positive for gait problem.  Neurological: Positive for weakness.  All other systems reviewed and are negative.   Vitals:   11/30/16 1322  BP: 100/88  Pulse: 88  Temp: 97.6 F (36.4 C)  SpO2: 98%  Weight: 222 lb (100.7 kg)   Body mass index is 27.75 kg/m.  Physical Exam  Constitutional: He is oriented to person, place, and time. He appears well-developed.  Frail appearing in NAD,sitting up in bed  HENT:  Mouth/Throat: Oropharynx is clear and moist.  Eyes: Pupils are equal, round, and reactive to light. No scleral icterus.  Neck: Neck supple. Carotid bruit is not present. No thyromegaly present.  Cardiovascular: Normal  rate, regular rhythm and intact distal pulses.  Exam reveals no gallop and no friction rub.   Murmur (1/6 SEM) heard. no distal LE swelling. No calf TTP  Pulmonary/Chest: Effort normal. He has decreased breath sounds (b/l at base). He has no wheezes. He has no rales. He exhibits no tenderness.  Poor inspiratory effort  Abdominal: Soft. Bowel sounds are normal. He exhibits no distension, no abdominal bruit, no pulsatile midline mass and no mass. There is no hepatomegaly. There is tenderness. There is no rebound and no guarding.  Abdominal incisional staples intact -no dehiscence, redness or d/c.   Musculoskeletal: He exhibits edema.  Lymphadenopathy:    He has no cervical adenopathy.  Neurological: He is alert and oriented to person, place, and time.  Skin: Skin is warm and dry. No rash noted.  Psychiatric: He has a normal mood and affect. His behavior is normal. Thought content normal.     Labs reviewed: Admission on 11/08/2016, Discharged on 11/26/2016  No results displayed because visit has over 200 results.  CBC Latest Ref Rng & Units 11/25/2016 11/23/2016 11/22/2016  WBC 4.0 - 10.5 K/uL 4.0 5.7 4.5  Hemoglobin 13.0 - 17.0 g/dL 10.2(L)  10.2(L) 9.5(L)  Hematocrit 39.0 - 52.0 % 30.5(L) 30.5(L) 28.2(L)  Platelets 150 - 400 K/uL 240 247 221   CMP Latest Ref Rng & Units 11/25/2016 11/23/2016 11/22/2016  Glucose 65 - 99 mg/dL 115(H) 105(H) 103(H)  BUN 6 - 20 mg/dL 7 9 8   Creatinine 0.61 - 1.24 mg/dL 0.49(L) 0.45(L) 0.58(L)  Sodium 135 - 145 mmol/L 133(L) 137 137  Potassium 3.5 - 5.1 mmol/L 4.0 4.3 4.0  Chloride 101 - 111 mmol/L 102 105 104  CO2 22 - 32 mmol/L 26 26 26   Calcium 8.9 - 10.3 mg/dL 7.4(L) 7.5(L) 7.9(L)  Total Protein 6.5 - 8.1 g/dL - - -  Total Bilirubin 0.3 - 1.2 mg/dL - - -  Alkaline Phos 38 - 126 U/L - - -  AST 15 - 41 U/L - - -  ALT 17 - 63 U/L - - -   Lab Results  Component Value Date   HGBA1C 5.2 11/09/2016        Dg Abdomen 1 View  Result Date: 11/08/2016 CLINICAL DATA:  Constipation over the last month. EXAM: ABDOMEN - 1 VIEW COMPARISON:  None. FINDINGS: Gas pattern does not suggest ileus or obstruction. Amount of fecal matter in the colon is within normal limits with the exception of slightly prominent stool in the rectal vault. No significant soft tissue calcifications. Some vascular calcification noted. Chronic degenerative changes affect the spine. IMPRESSION: No markedly abnormal findings. Overall amount of fecal matter appears within normal limits. Slightly prominent stool in the rectal vault. Electronically Signed   By: Nelson Chimes M.D.   On: 11/08/2016 14:29   Ct Head Wo Contrast  Result Date: 11/08/2016 CLINICAL DATA:  Generalized weakness. Poor historian. Difficulty raising right arm. EXAM: CT HEAD WITHOUT CONTRAST TECHNIQUE: Contiguous axial images were obtained from the base of the skull through the vertex without intravenous contrast. COMPARISON:  None. FINDINGS: Brain: Ventricles and cisterns are within normal. There is minimal prominence of the CSF spaces compatible with age related atrophy. There is no mass, mass effect, shift of midline structures or acute hemorrhage. No evidence  of acute infarction. Vascular: Minimal calcified plaque over the cavernous segment of the internal carotid arteries. Skull: Within normal. Sinuses/Orbits: Within normal. IMPRESSION: No acute intracranial findings. Electronically Signed   By: Marin Olp  M.D.   On: 11/08/2016 15:56   Ct Abdomen Pelvis W Contrast  Addendum Date: 11/15/2016   ADDENDUM REPORT: 11/15/2016 08:56 ADDENDUM: Case reviewed prior to barium enema. There is sessile and lobulated mural filling defect in the cecum on the right lateral wall measuring 3 cm in diameter by 12 mm in thickness. While adherent stool can give this appearance, finding is more concerning for a mass. Case discussed with Dr. Fuller Plan via telephone at time of addendum. Patient is under consideration for colonoscopy. Electronically Signed   By: Monte Fantasia M.D.   On: 11/15/2016 08:56   Result Date: 11/15/2016 CLINICAL DATA:  Initial evaluation for weakness, weight loss, constipation. EXAM: CT ABDOMEN AND PELVIS WITH CONTRAST TECHNIQUE: Multidetector CT imaging of the abdomen and pelvis was performed using the standard protocol following bolus administration of intravenous contrast. CONTRAST:  169mL ISOVUE-300 IOPAMIDOL (ISOVUE-300) INJECTION 61% COMPARISON:  Prior radiograph from earlier the same day. FINDINGS: Lower chest: Mild scattered atelectatic changes present within the visualized lung bases. Visualized lungs are otherwise clear. No pleural or pericardial fusion. Hepatobiliary: Liver demonstrates a normal contrast enhanced appearance. Gallbladder within normal limits. No biliary dilatation. Pancreas: Pancreas within normal limits. Spleen: Scattered hypodense lesions noted within the spleen, largest of which measures 9 mm. These are indeterminate, but are of doubtful clinical significance. Spleen otherwise unremarkable. Adrenals/Urinary Tract: 2.3 cm hypodense nodule noted within the right adrenal gland, most likely a small adenoma. Left adrenal gland within  normal limits. Kidneys equal in size with symmetric enhancement. Scattered parapelvic cyst noted within the left kidney. Few scattered calcifications within the left renal hilum suspicious for small nonobstructive calculi. These measure up to 3 mm. No hydronephrosis. No focal enhancing renal mass. Scattered cortical thinning/scarring noted. Ureters are of normal caliber without acute abnormality. Bladder within normal limits. Stomach/Bowel: Small hiatal hernia noted. Stomach otherwise unremarkable. Small bowel within normal limits for caliber without evidence for obstruction. Colonic diverticulosis without evidence for acute diverticulitis. Appendix not visualize, compatible with history of prior appendectomy. No abnormal wall thickening, mucosal enhancement, or inflammatory fat stranding seen about the bowels. Large amount of retained stool within the rectal vault, suggesting constipation. Vascular/Lymphatic: Normal intravascular enhancement seen throughout the intra-abdominal aorta and its branch vessels. Moderate aorto bi-iliac atherosclerotic disease. No aneurysm. No pathologically enlarged intra-abdominal or pelvic lymph nodes. Reproductive: Prostate within normal limits. Other: No free air or fluid. Small bilateral fat containing inguinal hernias noted. Musculoskeletal: No acute osseous abnormality. No worrisome lytic or blastic osseous lesions. Moderate multilevel degenerative spondylolysis noted within the visualized spine. IMPRESSION: 1. No CT evidence for acute intra-abdominal or pelvic process. 2. Large amount of retained stool within the rectal vault, suggesting constipation. 3. Sigmoid diverticulosis without evidence for acute diverticulitis. 4. Question punctate nonobstructive left renal nephrolithiasis. Electronically Signed: By: Jeannine Boga M.D. On: 11/08/2016 18:08   Dg Abd 2 Views  Result Date: 11/10/2016 CLINICAL DATA:  Acute generalized abdominal pain. EXAM: ABDOMEN - 2 VIEW  COMPARISON:  CT scan of November 08, 2016. FINDINGS: The bowel gas pattern is normal. There is no evidence of free air. No radio-opaque calculi or other significant radiographic abnormality is seen. Stool is noted in the rectum. IMPRESSION: No definite evidence of bowel obstruction or ileus. Electronically Signed   By: Marijo Conception, M.D.   On: 11/10/2016 15:53   Dg Colon Therapeutic W/cm  Result Date: 11/12/2016 CLINICAL DATA:  Severe constipation. EXAM: WATER SOLUBLE CONTRAST ENEMA TECHNIQUE: Approximately 3 L of warm water mixed with  480 cc of Gastrografin was instilled per rectum. FLUOROSCOPY TIME:  Radiation Exposure Index (if provided by the fluoroscopic device): 183.47 mGy COMPARISON:  Radiographs dated 11/10/2016 FINDINGS: EKG knee demonstrates moderate stool in the ascending colon as well as in the rectum. The study shows a 6 mm stool ball in the rectum. There is also stool throughout the remainder of the colon particularly in the cecum. There are few diverticula in the sigmoid portion the colon. The study was suboptimal due to patient's inability to move well during the exam. Multiple smaller stool balls throughout the colon. I was able to get some contrast into the ascending colon. There are no discrete strictures or definitive mass lesions. The patient was unable to sit up to evacuate the contrast at the end of the exam. IMPRESSION: Extensive stool in the distal colon as well as in the ascending colon. No dilatation of the colon. Scattered diverticula in the sigmoid portion of the colon. No discrete obstruction or mass lesion. However, the study is suboptimal for detail because of the patient's inability to retain the contrast or move well during the exam. Electronically Signed   By: Lorriane Shire M.D.   On: 11/12/2016 12:46     Assessment/Plan   ICD-9-CM ICD-10-CM   1. Chronic atrial fibrillation (HCC) 427.31 I48.2   2. Essential hypertension, benign 401.1 I10   3. Other constipation  564.09 K59.09   4. Protein-calorie malnutrition, severe 262 E43   5. CKD (chronic kidney disease) stage 3, GFR 30-59 ml/min 585.3 N18.3   6. Diabetes mellitus type 2 in obese (HCC) 250.00 E11.69    278.00 E66.9   7. BPH with obstruction/lower urinary tract symptoms 600.01 N40.1    599.69 N13.8   8. S/P partial colectomy V45.89 Z90.49    due to colon mass; grade 1 adenoCA  9. Physical deconditioning 799.3 R53.81      Cont current meds as ordered  PT/OT/St as ordered  F/u with surgery as scheduled. Staples need to be removed no later than 12/02/16  Check cbc and bmp, digoxin level  Nutritional supplements as ordered  Use spirometry at least 3 times daily to improve inspiratory effort and reduce atelectasis  Wound care as ordered  [facility] S. Perlie Gold  [facility] [address] [place], [place] [address] [phone] Cell (Monday-Friday 8 AM - 5 PM) [phone] After 5 PM and follow prompts

## 2016-12-01 LAB — BASIC METABOLIC PANEL
BUN: 13 mg/dL (ref 4–21)
CREATININE: 0.5 mg/dL — AB (ref 0.6–1.3)
Glucose: 131 mg/dL
POTASSIUM: 4.3 mmol/L (ref 3.4–5.3)
Sodium: 136 mmol/L — AB (ref 137–147)

## 2016-12-01 LAB — CBC AND DIFFERENTIAL
HCT: 33 % — AB (ref 41–53)
Hemoglobin: 10.8 g/dL — AB (ref 13.5–17.5)
Platelets: 241 10*3/uL (ref 150–399)
WBC: 5.9 10*3/mL

## 2016-12-03 ENCOUNTER — Inpatient Hospital Stay (HOSPITAL_COMMUNITY)
Admission: EM | Admit: 2016-12-03 | Discharge: 2016-12-08 | DRG: 389 | Disposition: A | Discharge status: Skilled Nursing Facility | Payer: PPO | Attending: Internal Medicine | Admitting: Internal Medicine

## 2016-12-03 ENCOUNTER — Encounter (HOSPITAL_COMMUNITY): Payer: Self-pay

## 2016-12-03 DIAGNOSIS — K6289 Other specified diseases of anus and rectum: Secondary | ICD-10-CM | POA: Diagnosis not present

## 2016-12-03 DIAGNOSIS — Z590 Homelessness: Secondary | ICD-10-CM

## 2016-12-03 DIAGNOSIS — R100 Acute abdomen: Secondary | ICD-10-CM | POA: Diagnosis not present

## 2016-12-03 DIAGNOSIS — N183 Chronic kidney disease, stage 3 (moderate): Secondary | ICD-10-CM | POA: Diagnosis not present

## 2016-12-03 DIAGNOSIS — Z79899 Other long term (current) drug therapy: Secondary | ICD-10-CM | POA: Diagnosis not present

## 2016-12-03 DIAGNOSIS — I129 Hypertensive chronic kidney disease with stage 1 through stage 4 chronic kidney disease, or unspecified chronic kidney disease: Secondary | ICD-10-CM | POA: Diagnosis present

## 2016-12-03 DIAGNOSIS — N400 Enlarged prostate without lower urinary tract symptoms: Secondary | ICD-10-CM | POA: Diagnosis present

## 2016-12-03 DIAGNOSIS — I480 Paroxysmal atrial fibrillation: Secondary | ICD-10-CM | POA: Diagnosis present

## 2016-12-03 DIAGNOSIS — F419 Anxiety disorder, unspecified: Secondary | ICD-10-CM | POA: Diagnosis present

## 2016-12-03 DIAGNOSIS — E1122 Type 2 diabetes mellitus with diabetic chronic kidney disease: Secondary | ICD-10-CM | POA: Diagnosis present

## 2016-12-03 DIAGNOSIS — K5641 Fecal impaction: Secondary | ICD-10-CM | POA: Diagnosis not present

## 2016-12-03 DIAGNOSIS — N401 Enlarged prostate with lower urinary tract symptoms: Secondary | ICD-10-CM | POA: Diagnosis not present

## 2016-12-03 DIAGNOSIS — H269 Unspecified cataract: Secondary | ICD-10-CM | POA: Diagnosis not present

## 2016-12-03 DIAGNOSIS — E669 Obesity, unspecified: Secondary | ICD-10-CM | POA: Diagnosis not present

## 2016-12-03 DIAGNOSIS — I4891 Unspecified atrial fibrillation: Secondary | ICD-10-CM | POA: Diagnosis not present

## 2016-12-03 DIAGNOSIS — F329 Major depressive disorder, single episode, unspecified: Secondary | ICD-10-CM | POA: Diagnosis not present

## 2016-12-03 DIAGNOSIS — R531 Weakness: Secondary | ICD-10-CM | POA: Diagnosis not present

## 2016-12-03 DIAGNOSIS — Z7901 Long term (current) use of anticoagulants: Secondary | ICD-10-CM | POA: Diagnosis not present

## 2016-12-03 DIAGNOSIS — E785 Hyperlipidemia, unspecified: Secondary | ICD-10-CM | POA: Diagnosis not present

## 2016-12-03 DIAGNOSIS — Z6826 Body mass index (BMI) 26.0-26.9, adult: Secondary | ICD-10-CM

## 2016-12-03 DIAGNOSIS — Z7982 Long term (current) use of aspirin: Secondary | ICD-10-CM | POA: Diagnosis not present

## 2016-12-03 DIAGNOSIS — N138 Other obstructive and reflux uropathy: Secondary | ICD-10-CM | POA: Diagnosis present

## 2016-12-03 DIAGNOSIS — D539 Nutritional anemia, unspecified: Secondary | ICD-10-CM | POA: Diagnosis not present

## 2016-12-03 DIAGNOSIS — I482 Chronic atrial fibrillation: Secondary | ICD-10-CM | POA: Diagnosis present

## 2016-12-03 DIAGNOSIS — Z8249 Family history of ischemic heart disease and other diseases of the circulatory system: Secondary | ICD-10-CM

## 2016-12-03 DIAGNOSIS — C182 Malignant neoplasm of ascending colon: Secondary | ICD-10-CM | POA: Diagnosis present

## 2016-12-03 DIAGNOSIS — Z888 Allergy status to other drugs, medicaments and biological substances status: Secondary | ICD-10-CM | POA: Diagnosis not present

## 2016-12-03 DIAGNOSIS — E119 Type 2 diabetes mellitus without complications: Secondary | ICD-10-CM | POA: Diagnosis present

## 2016-12-03 DIAGNOSIS — I10 Essential (primary) hypertension: Secondary | ICD-10-CM | POA: Diagnosis not present

## 2016-12-03 DIAGNOSIS — Z9049 Acquired absence of other specified parts of digestive tract: Secondary | ICD-10-CM | POA: Diagnosis not present

## 2016-12-03 DIAGNOSIS — E46 Unspecified protein-calorie malnutrition: Secondary | ICD-10-CM | POA: Diagnosis not present

## 2016-12-03 DIAGNOSIS — C189 Malignant neoplasm of colon, unspecified: Secondary | ICD-10-CM

## 2016-12-03 DIAGNOSIS — Z79891 Long term (current) use of opiate analgesic: Secondary | ICD-10-CM | POA: Diagnosis not present

## 2016-12-03 DIAGNOSIS — R1030 Lower abdominal pain, unspecified: Secondary | ICD-10-CM

## 2016-12-03 DIAGNOSIS — Z87891 Personal history of nicotine dependence: Secondary | ICD-10-CM

## 2016-12-03 DIAGNOSIS — N289 Disorder of kidney and ureter, unspecified: Secondary | ICD-10-CM | POA: Diagnosis not present

## 2016-12-03 DIAGNOSIS — F411 Generalized anxiety disorder: Secondary | ICD-10-CM | POA: Diagnosis not present

## 2016-12-03 DIAGNOSIS — K5649 Other impaction of intestine: Secondary | ICD-10-CM | POA: Diagnosis not present

## 2016-12-03 DIAGNOSIS — M6281 Muscle weakness (generalized): Secondary | ICD-10-CM | POA: Diagnosis not present

## 2016-12-03 DIAGNOSIS — N390 Urinary tract infection, site not specified: Secondary | ICD-10-CM | POA: Diagnosis not present

## 2016-12-03 DIAGNOSIS — R109 Unspecified abdominal pain: Secondary | ICD-10-CM | POA: Diagnosis not present

## 2016-12-03 NOTE — ED Triage Notes (Signed)
Pt brought in by EMS pt is from [facility]. Per EMS Pt had surgery for a disimpaction on 11/22/16, and is c/o lower abd pain, and rectal pain. Per EMS pt has not had a good BM x5 days and stool has been watery.

## 2016-12-03 NOTE — ED Notes (Signed)
Bed: [id number] Expected date:  Expected time:  Means of arrival:  Comments: constipation

## 2016-12-04 ENCOUNTER — Emergency Department (HOSPITAL_COMMUNITY): Payer: PPO | Admitting: Certified Registered Nurse Anesthetist

## 2016-12-04 ENCOUNTER — Encounter (HOSPITAL_COMMUNITY): Payer: Self-pay | Admitting: Radiology

## 2016-12-04 ENCOUNTER — Emergency Department (HOSPITAL_COMMUNITY): Payer: PPO

## 2016-12-04 ENCOUNTER — Encounter (HOSPITAL_COMMUNITY)
Admission: EM | Disposition: A | Discharge status: Skilled Nursing Facility | Payer: Self-pay | Source: Home / Self Care | Attending: Family Medicine

## 2016-12-04 DIAGNOSIS — I4891 Unspecified atrial fibrillation: Secondary | ICD-10-CM | POA: Diagnosis not present

## 2016-12-04 DIAGNOSIS — D539 Nutritional anemia, unspecified: Secondary | ICD-10-CM | POA: Diagnosis not present

## 2016-12-04 DIAGNOSIS — E871 Hypo-osmolality and hyponatremia: Secondary | ICD-10-CM | POA: Diagnosis not present

## 2016-12-04 DIAGNOSIS — N183 Chronic kidney disease, stage 3 (moderate): Secondary | ICD-10-CM | POA: Diagnosis not present

## 2016-12-04 DIAGNOSIS — N289 Disorder of kidney and ureter, unspecified: Secondary | ICD-10-CM | POA: Diagnosis not present

## 2016-12-04 DIAGNOSIS — H269 Unspecified cataract: Secondary | ICD-10-CM | POA: Diagnosis not present

## 2016-12-04 DIAGNOSIS — E1122 Type 2 diabetes mellitus with diabetic chronic kidney disease: Secondary | ICD-10-CM | POA: Diagnosis not present

## 2016-12-04 DIAGNOSIS — Z888 Allergy status to other drugs, medicaments and biological substances status: Secondary | ICD-10-CM | POA: Diagnosis not present

## 2016-12-04 DIAGNOSIS — F411 Generalized anxiety disorder: Secondary | ICD-10-CM | POA: Diagnosis not present

## 2016-12-04 DIAGNOSIS — E876 Hypokalemia: Secondary | ICD-10-CM | POA: Diagnosis not present

## 2016-12-04 DIAGNOSIS — Z7901 Long term (current) use of anticoagulants: Secondary | ICD-10-CM | POA: Diagnosis not present

## 2016-12-04 DIAGNOSIS — I482 Chronic atrial fibrillation: Secondary | ICD-10-CM | POA: Diagnosis not present

## 2016-12-04 DIAGNOSIS — K5900 Constipation, unspecified: Secondary | ICD-10-CM | POA: Diagnosis not present

## 2016-12-04 DIAGNOSIS — R1084 Generalized abdominal pain: Secondary | ICD-10-CM | POA: Diagnosis not present

## 2016-12-04 DIAGNOSIS — E119 Type 2 diabetes mellitus without complications: Secondary | ICD-10-CM | POA: Diagnosis not present

## 2016-12-04 DIAGNOSIS — K6289 Other specified diseases of anus and rectum: Secondary | ICD-10-CM | POA: Diagnosis present

## 2016-12-04 DIAGNOSIS — Z79891 Long term (current) use of opiate analgesic: Secondary | ICD-10-CM | POA: Diagnosis not present

## 2016-12-04 DIAGNOSIS — Z590 Homelessness: Secondary | ICD-10-CM | POA: Diagnosis not present

## 2016-12-04 DIAGNOSIS — F329 Major depressive disorder, single episode, unspecified: Secondary | ICD-10-CM | POA: Diagnosis not present

## 2016-12-04 DIAGNOSIS — Z79899 Other long term (current) drug therapy: Secondary | ICD-10-CM | POA: Diagnosis not present

## 2016-12-04 DIAGNOSIS — K5641 Fecal impaction: Principal | ICD-10-CM

## 2016-12-04 DIAGNOSIS — Z9049 Acquired absence of other specified parts of digestive tract: Secondary | ICD-10-CM | POA: Diagnosis not present

## 2016-12-04 DIAGNOSIS — K5649 Other impaction of intestine: Secondary | ICD-10-CM | POA: Diagnosis not present

## 2016-12-04 DIAGNOSIS — N401 Enlarged prostate with lower urinary tract symptoms: Secondary | ICD-10-CM | POA: Diagnosis not present

## 2016-12-04 DIAGNOSIS — R109 Unspecified abdominal pain: Secondary | ICD-10-CM | POA: Diagnosis not present

## 2016-12-04 DIAGNOSIS — R100 Acute abdomen: Secondary | ICD-10-CM | POA: Diagnosis not present

## 2016-12-04 DIAGNOSIS — M6281 Muscle weakness (generalized): Secondary | ICD-10-CM | POA: Diagnosis not present

## 2016-12-04 DIAGNOSIS — I10 Essential (primary) hypertension: Secondary | ICD-10-CM | POA: Diagnosis not present

## 2016-12-04 DIAGNOSIS — N390 Urinary tract infection, site not specified: Secondary | ICD-10-CM | POA: Diagnosis not present

## 2016-12-04 DIAGNOSIS — N400 Enlarged prostate without lower urinary tract symptoms: Secondary | ICD-10-CM | POA: Diagnosis not present

## 2016-12-04 DIAGNOSIS — C182 Malignant neoplasm of ascending colon: Secondary | ICD-10-CM | POA: Diagnosis not present

## 2016-12-04 DIAGNOSIS — E669 Obesity, unspecified: Secondary | ICD-10-CM | POA: Diagnosis not present

## 2016-12-04 DIAGNOSIS — E43 Unspecified severe protein-calorie malnutrition: Secondary | ICD-10-CM | POA: Diagnosis not present

## 2016-12-04 DIAGNOSIS — Z7982 Long term (current) use of aspirin: Secondary | ICD-10-CM | POA: Diagnosis not present

## 2016-12-04 DIAGNOSIS — E785 Hyperlipidemia, unspecified: Secondary | ICD-10-CM | POA: Diagnosis not present

## 2016-12-04 DIAGNOSIS — I129 Hypertensive chronic kidney disease with stage 1 through stage 4 chronic kidney disease, or unspecified chronic kidney disease: Secondary | ICD-10-CM | POA: Diagnosis not present

## 2016-12-04 DIAGNOSIS — E46 Unspecified protein-calorie malnutrition: Secondary | ICD-10-CM | POA: Diagnosis not present

## 2016-12-04 DIAGNOSIS — F419 Anxiety disorder, unspecified: Secondary | ICD-10-CM | POA: Diagnosis not present

## 2016-12-04 LAB — CBC WITH DIFFERENTIAL/PLATELET
Basophils Absolute: 0 10*3/uL (ref 0.0–0.1)
Basophils Relative: 0 %
EOS PCT: 3 %
Eosinophils Absolute: 0.2 10*3/uL (ref 0.0–0.7)
HCT: 34.6 % — ABNORMAL LOW (ref 39.0–52.0)
Hemoglobin: 11.6 g/dL — ABNORMAL LOW (ref 13.0–17.0)
LYMPHS ABS: 1.5 10*3/uL (ref 0.7–4.0)
LYMPHS PCT: 19 %
MCH: 33.7 pg (ref 26.0–34.0)
MCHC: 33.5 g/dL (ref 30.0–36.0)
MCV: 100.6 fL — AB (ref 78.0–100.0)
MONO ABS: 0.4 10*3/uL (ref 0.1–1.0)
Monocytes Relative: 4 %
Neutro Abs: 6 10*3/uL (ref 1.7–7.7)
Neutrophils Relative %: 74 %
PLATELETS: 334 10*3/uL (ref 150–400)
RBC: 3.44 MIL/uL — AB (ref 4.22–5.81)
RDW: 17.4 % — AB (ref 11.5–15.5)
WBC: 8.1 10*3/uL (ref 4.0–10.5)

## 2016-12-04 LAB — GLUCOSE, CAPILLARY
GLUCOSE-CAPILLARY: 85 mg/dL (ref 65–99)
GLUCOSE-CAPILLARY: 86 mg/dL (ref 65–99)

## 2016-12-04 LAB — COMPREHENSIVE METABOLIC PANEL
ALT: 27 U/L (ref 17–63)
AST: 27 U/L (ref 15–41)
Albumin: 3.4 g/dL — ABNORMAL LOW (ref 3.5–5.0)
Alkaline Phosphatase: 79 U/L (ref 38–126)
Anion gap: 8 (ref 5–15)
BUN: 29 mg/dL — AB (ref 6–20)
CHLORIDE: 100 mmol/L — AB (ref 101–111)
CO2: 26 mmol/L (ref 22–32)
CREATININE: 0.71 mg/dL (ref 0.61–1.24)
Calcium: 8.4 mg/dL — ABNORMAL LOW (ref 8.9–10.3)
GFR calc Af Amer: >60 mL/min (ref >60)
GFR calc non Af Amer: >60 mL/min (ref >60)
Glucose, Bld: 119 mg/dL — ABNORMAL HIGH (ref 65–99)
Potassium: 3.9 mmol/L (ref 3.5–5.1)
SODIUM: 134 mmol/L — AB (ref 135–145)
Total Bilirubin: 1 mg/dL (ref 0.3–1.2)
Total Protein: 6.5 g/dL (ref 6.5–8.1)

## 2016-12-04 LAB — URINALYSIS, ROUTINE W REFLEX MICROSCOPIC
Bilirubin Urine: NEGATIVE
GLUCOSE, UA: 50 mg/dL — AB
HGB URINE DIPSTICK: NEGATIVE
Ketones, ur: NEGATIVE mg/dL
Leukocytes, UA: NEGATIVE
Nitrite: NEGATIVE
PROTEIN: NEGATIVE mg/dL
Specific Gravity, Urine: 1.033 — ABNORMAL HIGH (ref 1.005–1.030)
pH: 5 (ref 5.0–8.0)

## 2016-12-04 LAB — LIPASE, BLOOD: Lipase: 18 U/L (ref 11–51)

## 2016-12-04 LAB — DIGOXIN LEVEL: Digoxin Level: 0.2 ng/mL — ABNORMAL LOW (ref 0.8–2.0)

## 2016-12-04 SURGERY — EXAM UNDER ANESTHESIA, RECTUM
Anesthesia: General

## 2016-12-04 MED ORDER — INSULIN ASPART 100 UNIT/ML ~~LOC~~ SOLN: 0.0000 [IU] | Freq: Every day | SUBCUTANEOUS | Status: DC

## 2016-12-04 MED ORDER — SODIUM CHLORIDE 0.9 % IV SOLN
INTRAVENOUS | Status: DC
  Administered 2016-12-04 – 2016-12-06 (×4): via INTRAVENOUS

## 2016-12-04 MED ORDER — ONDANSETRON HCL 4 MG/2ML IJ SOLN
4.0000 mg | Freq: Once | INTRAMUSCULAR | Status: AC
  Administered 2016-12-04: 4 mg via INTRAVENOUS
  Filled 2016-12-04: qty 2

## 2016-12-04 MED ORDER — MIDAZOLAM HCL 2 MG/2ML IJ SOLN
INTRAMUSCULAR | Status: AC
  Filled 2016-12-04: qty 2

## 2016-12-04 MED ORDER — INSULIN ASPART 100 UNIT/ML ~~LOC~~ SOLN
0.0000 [IU] | Freq: Three times a day (TID) | SUBCUTANEOUS | Status: DC
  Administered 2016-12-06 – 2016-12-07 (×2): 1 [IU] via SUBCUTANEOUS
  Administered 2016-12-07: 2 [IU] via SUBCUTANEOUS
  Administered 2016-12-08: 1 [IU] via SUBCUTANEOUS

## 2016-12-04 MED ORDER — HEPARIN SODIUM (PORCINE) 5000 UNIT/ML IJ SOLN
5000.0000 [IU] | Freq: Three times a day (TID) | INTRAMUSCULAR | Status: DC
  Administered 2016-12-04 – 2016-12-08 (×10): 5000 [IU] via SUBCUTANEOUS
  Filled 2016-12-04 (×11): qty 1

## 2016-12-04 MED ORDER — LIDOCAINE 2% (20 MG/ML) 5 ML SYRINGE
INTRAMUSCULAR | Status: AC
  Filled 2016-12-04: qty 5

## 2016-12-04 MED ORDER — FENTANYL CITRATE (PF) 100 MCG/2ML IJ SOLN
INTRAMUSCULAR | Status: AC
  Filled 2016-12-04: qty 2

## 2016-12-04 MED ORDER — ONDANSETRON HCL 4 MG/2ML IJ SOLN: 4.0000 mg | Freq: Four times a day (QID) | INTRAMUSCULAR | Status: DC | PRN

## 2016-12-04 MED ORDER — ONDANSETRON HCL 4 MG PO TABS: 4.0000 mg | ORAL_TABLET | Freq: Four times a day (QID) | ORAL | Status: DC | PRN

## 2016-12-04 MED ORDER — BUPIVACAINE HCL (PF) 0.5 % IJ SOLN
INTRAMUSCULAR | Status: AC
  Filled 2016-12-04: qty 30

## 2016-12-04 MED ORDER — SODIUM CHLORIDE 0.9 % IV BOLUS (SEPSIS)
500.0000 mL | Freq: Once | INTRAVENOUS | Status: AC
  Administered 2016-12-04: 500 mL via INTRAVENOUS

## 2016-12-04 MED ORDER — IOPAMIDOL (ISOVUE-300) INJECTION 61%
100.0000 mL | Freq: Once | INTRAVENOUS | Status: AC | PRN
  Administered 2016-12-04: 100 mL via INTRAVENOUS

## 2016-12-04 MED ORDER — METOPROLOL TARTRATE 5 MG/5ML IV SOLN
5.0000 mg | Freq: Once | INTRAVENOUS | Status: DC
  Filled 2016-12-04: qty 5

## 2016-12-04 MED ORDER — ONDANSETRON HCL 4 MG/2ML IJ SOLN
INTRAMUSCULAR | Status: AC
Start: 2016-12-04 — End: 2016-12-04
  Filled 2016-12-04: qty 2

## 2016-12-04 MED ORDER — IOPAMIDOL (ISOVUE-300) INJECTION 61%
INTRAVENOUS | Status: AC
  Filled 2016-12-04: qty 100

## 2016-12-04 MED ORDER — ACETAMINOPHEN 325 MG PO TABS
650.0000 mg | ORAL_TABLET | Freq: Four times a day (QID) | ORAL | Status: DC | PRN
  Administered 2016-12-04 – 2016-12-05 (×2): 650 mg via ORAL
  Filled 2016-12-04 (×2): qty 2

## 2016-12-04 MED ORDER — SODIUM CHLORIDE 0.9 % IV BOLUS (SEPSIS)
1000.0000 mL | Freq: Once | INTRAVENOUS | Status: AC
  Administered 2016-12-04: 1000 mL via INTRAVENOUS

## 2016-12-04 MED ORDER — PROPOFOL 10 MG/ML IV BOLUS
INTRAVENOUS | Status: AC
  Filled 2016-12-04: qty 20

## 2016-12-04 MED ORDER — DICYCLOMINE HCL 10 MG PO CAPS
10.0000 mg | ORAL_CAPSULE | Freq: Once | ORAL | Status: AC
  Administered 2016-12-04: 10 mg via ORAL
  Filled 2016-12-04: qty 1

## 2016-12-04 MED ORDER — DEXAMETHASONE SODIUM PHOSPHATE 10 MG/ML IJ SOLN
INTRAMUSCULAR | Status: AC
  Filled 2016-12-04: qty 1

## 2016-12-04 MED ORDER — MORPHINE SULFATE (PF) 4 MG/ML IV SOLN
4.0000 mg | Freq: Once | INTRAVENOUS | Status: AC
  Administered 2016-12-04: 4 mg via INTRAVENOUS
  Filled 2016-12-04: qty 1

## 2016-12-04 NOTE — H&P (Addendum)
Re:   [name] DOB:   February 14, 1951 MRN:   [id number]   [name] Consultation  ASSESSMENT AND PLAN: 1.  Rectal pain  He has a history of impactions  His CT scan seems to show an impaction today  Plan to take to the OR for exam under anesthesia.  2.  Abdominal pain  Odd air in mediastinum/upper abdomen and in inguinal canal  He clinically does not look sick.  But I think ought to be observed for at least 24 hours with repeat PE and labs.  3.  Right hemicolectomy - 11/22/2016 - T. Rosenbower  [place] of right colon, Grade 1, 0/15 nodes (T1, N0)  Dr. Fuller Plan - GI  4.  Homeless 5.  History of A. Fib  On metoprolol and digoxin  Does not see a cardiologist regularly  Digoxin level - 0.2 (low normal - 0.8)  His current EKG shows what appears to be A Flutter Will discuss with hospitalist. (Discussed with Dr. Chauncey Cruel. [place] - anesthesia does not want to provide anesthesia until his heart is under control.  Dr. Ernestina Patches will admit and we will follow) 6.  BPH 7.  Malnutrition 8.  History of glucose intolerance (better since he had significant weight loss)  Last HgbA1C - 5.2 - 11/09/2016 9.  Depression  Chief Complaint  Patient presents with  . Abdominal Pain   PHYSICIAN REQUESTING CONSULTATION:  Dr. Rudean Haskell, [facility] ER  HISTORY OF PRESENT ILLNESS: [name] is a 65 y.o. (DOB: 1951/06/30)  white male whose primary care physician is Sandi Mariscal, MD and comes to [facility] ER today for abdominal and rectal pain.  He is in the ER by himself.  He is a rambling historian and it is hard to get him to focus on his specific problem. He was sent to the ER by [facility].  He was sent to [facility] at the time of discharge for PT and rehab.  He is also homeless.   He has had trouble with constipation for several months.  He has been using stools softeners.  He has had trouble with impaction, but has not required disimpaction.  He blames it on poor eating habits.  He thinks that he has lost about 100 pounds.  He  was admitted 11/08/2016 for constipation, malnutrition, and trouble walking.  He got a colonoscopy when he was found to have a cecal mass.  He underwent a right hemicolectomy on 11/22/2016 by Dr. Zella Richer for an early colon cancer.  The patient was unaware of the results of the path.     He was discharged to [facility] - but developed lower abdominal pain and rectal pain and was sent to [facility] last PM.  I was called because of the CT scan and his symptoms.   He is difficult to talk to because of his rambling history style.    Past Medical History:  Diagnosis Date  . Atrial fibrillation ([place])   . Cataract   . Diabetes mellitus without complication ([place])   . Hyperlipidemia   . Hypertension   . Lichen planus    bilateral legs      Past Surgical History:  Procedure Laterality Date  . APPENDECTOMY    . COLON RESECTION N/A 11/22/2016   Procedure: HAND ASSISTED LAPAROSCOPIC COLON RESECTION;  Surgeon: [name], MD;  Location: [facility] ORS;  Service: General;  Laterality: N/A;  . COLONOSCOPY N/A 11/17/2016   Procedure: COLONOSCOPY;  Surgeon: [name], MD;  Location: [facility] ENDOSCOPY;  Service: Endoscopy;  Laterality: N/A;  . FRACTURE SURGERY    . MULTIPLE TOOTH EXTRACTIONS        Current Facility-Administered Medications  Medication Dose Route Frequency Provider Last Rate Last Dose  . iopamidol (ISOVUE-300) 61 % injection            Current Outpatient Prescriptions  Medication Sig Dispense Refill  . aspirin EC 81 MG EC tablet Take 1 tablet (81 mg total) by mouth daily.    . busPIRone (BUSPAR) 7.5 MG tablet Take 1 tablet (7.5 mg total) by mouth 3 (three) times daily.    . digoxin (LANOXIN) 0.125 MG tablet Take 1 tablet (0.125 mg total) by mouth daily.    . feeding supplement, ENSURE ENLIVE, (ENSURE ENLIVE) LIQD Take 237 mLs by mouth 4 (four) times daily.    . hydrocortisone (ANUSOL-HC) 2.5 % rectal cream Place rectally every 12 (twelve) hours as needed for hemorrhoids or itching. 30  g 0  . liver oil-zinc oxide (DESITIN) 40 % ointment Apply topically as needed for irritation.    . methocarbamol (ROBAXIN) 500 MG tablet Take 1 tablet (500 mg total) by mouth every 8 (eight) hours as needed for muscle spasms.    . Metoprolol Tartrate 75 MG TABS Take 75 mg by mouth 2 (two) times daily.    . Multiple Vitamin (MULTIVITAMIN) capsule Take 1 capsule by mouth daily.    [name] oxyCODONE (OXY IR/ROXICODONE) 5 MG immediate release tablet Take 1-2 tablets (5-10 mg total) by mouth every 4 (four) hours as needed for moderate pain. 30 tablet 0  . polyethylene glycol (MIRALAX) packet Take 17 g by mouth daily as needed for moderate constipation.    . ranitidine (ZANTAC) 150 MG capsule Take 150 mg by mouth as needed for heartburn.    . tamsulosin (FLOMAX) 0.4 MG CAPS capsule Take 1 capsule (0.4 mg total) by mouth daily.        Allergies  Allergen Reactions  . Other     [name] bandage (self-gripping) - erythema, itching    REVIEW OF SYSTEMS: Skin:  No history of rash.  No history of abnormal moles. Infection:  No history of hepatitis or HIV.  No history of MRSA. Neurologic:  No history of stroke.  No history of seizure.  No history of headaches. Cardiac:  History of A. Fib.  Has not wanted anticoagulation Pulmonary:  Does not smoke cigarettes.  No asthma or bronchitis.  No OSA/CPAP.  Endocrine:  Says that he was diabetic, but with weight loss, this is better. No thyroid disease. Gastrointestinal:  See HPI Urologic:  No history of kidney stones.  No history of bladder infections. Musculoskeletal:  Almost bedridden - weak.  Not entirely sure why. Hematologic:  No bleeding disorder.  No history of anemia.  Not anticoagulated. Psycho-social:  The patient is oriented.    SOCIAL and FAMILY HISTORY:  Homeless.  He has no support system.  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 Has a sister in [place].  Doree Fudge.  They helped him for a while, but they are out of his life.  Has been  through [employer] for housing.  No family history of colon cancer.  PHYSICAL EXAM: BP 112/64   Pulse (!) 31   Temp 98 F (36.7 C) (Oral)   Resp 24   SpO2 97%   General: Beard older WM who is alert.  Skin:  Inspection and palpation - no mass or rash. Eyes:  Conjunctiva and lids unremarkable.  Pupils are equal Ears, Nose, Mouth, and Throat:  Ears and nose unremarkable            Lips and teeth are unremarable. Neck: Supple. No mass, trachea midline.  No thyroid mass. Lymph Nodes:  No supraclavicular, cervical, or inguinal nodes. Lungs: Normal respiratory effort.  Clear to auscultation and symmetric breath sounds. Heart:  Palpation of the heart is normal.            Auscultation: Irregular rate             Abdomen: Soft. No mass. No tenderness. No hernia.             Liver:  Edge at right costal margin.            Incision looks okay.  He really does not have abdominal pain. Rectal: Large soft stool ball in rectal vault - impacted - but tender.  Probably best disimpacted under anesthesia. Musculoskeletal:  Weak.    Normal ROM in upper and lower extremities.  He cannot walk without assistance. Neurologic:  Grossly intact to motor and sensory function. Psychiatric: Normal judgement and insight. But hard to focus on a specific problem  DATA REVIEWED, COUNSELING AND COORDINATION OF CARE: Epic notes reviewed. Counseling and coordination of care exceeded more than 50% of the time spent with patient. Total time spent with patient and charting: 60 minutes  Alphonsa Overall, MD,  [facility], [address]  [place], [place]    [place] Phone:  [phone] FAX:  [phone]

## 2016-12-04 NOTE — ED Provider Notes (Signed)
[facility] DEPT Provider Note   CSN: [id number] Arrival date & time: 12/03/16  2310   By signing my name below, I, Eunice Blase, attest that this documentation has been prepared under the direction and in the presence of Quintella Reichert, MD. Electronically signed, Eunice Blase, ED Scribe. 12/04/16. 7:17 AM.   History   Chief Complaint Chief Complaint  Patient presents with  . Abdominal Pain   The history is provided by the patient and medical records. No language interpreter was used.    HPI Comments: [name] is a 65 y.o. male from [place] who presents to the Emergency Department complaining of pressure-like rectal pain. He reports associated severe abdominal pain x 4 days, appetite decrease, dysuria, incontinence, SOB secondary to pain and constipation. He reports an unidentified bowel obstruction found several weeks ago, and he notes partial colon removal ~ 2 weeks ago. He states he was released to [facility] following his procedure. He notes homelessness. Pt denies N/V, fever and flatulence, but he notes flatulence during evaluation. Sxs are moderate to severe, constant, worsening.   Past Medical History:  Diagnosis Date  . Atrial fibrillation ([place])   . Cataract   . Diabetes mellitus without complication ([place])   . Hyperlipidemia   . Hypertension   . Lichen planus    bilateral legs    Patient Active Problem List   Diagnosis Date Noted  . 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 Polyp of cecum 11/17/2016  . Abnormal CT scan, colon   . Benign neoplasm of cecum   . AKI (acute kidney injury) ([place])   . Generalized weakness   . Fecal impaction ([place])   . Abdominal pain   . Impaction of colon ([place])   . Protein-calorie malnutrition, severe 11/10/2016  . Constipation 11/09/2016  . Urinary tract infection without hematuria 11/09/2016  . Hypokalemia 11/09/2016  . CKD (chronic kidney disease) stage 3, GFR 30-59 ml/min 11/09/2016    . Hyperbilirubinemia 11/09/2016  . Homelessness 11/09/2016  . Hyponatremia 11/08/2016  . RBBB 09/13/2013  . Snoring 09/13/2013  . Edema 09/07/2013  . Diabetes mellitus type 2 in obese ([place]) 03/16/2013  . Arthritis 03/16/2013  . Essential hypertension, benign 02/23/2013  . Morbid obesity ([place]) 02/23/2013  . Atrial fibrillation ([place]) 02/23/2013  . Other and unspecified hyperlipidemia 02/23/2013    Past Surgical History:  Procedure Laterality Date  . APPENDECTOMY    . COLON RESECTION N/A 11/22/2016   Procedure: HAND ASSISTED LAPAROSCOPIC COLON RESECTION;  Surgeon: [name], MD;  [facility];  Service: General;  Laterality: N/A;  . COLONOSCOPY N/A 11/17/2016   Procedure: COLONOSCOPY;  Surgeon: [name], MD;  Location[facility] ENDOSCOPY;  Service: Endoscopy;  Laterality: N/A;  . FRACTURE SURGERY    . MULTIPLE TOOTH EXTRACTIONS         Home Medications    Prior to Admission medications   Medication Sig Start Date End Date Taking? Authorizing Provider  aspirin EC 81 MG EC tablet Take 1 tablet (81 mg total) by mouth daily. 11/27/16   Barton Dubois, MD  busPIRone (BUSPAR) 7.5 MG tablet Take 1 tablet (7.5 mg total) by mouth 3 (three) times daily. 11/26/16   Barton Dubois, MD  digoxin (LANOXIN) 0.125 MG tablet Take 1 tablet (0.125 mg total) by mouth daily. 11/27/16   Barton Dubois, MD  feeding supplement, ENSURE ENLIVE, (ENSURE ENLIVE) LIQD Take 237 mLs by mouth 4 (four) times daily. 11/26/16   Barton Dubois, MD  hydrocortisone (ANUSOL-HC) 2.5 %  rectal cream Place rectally every 12 (twelve) hours as needed for hemorrhoids or itching. 11/26/16   Barton Dubois, MD  liver oil-zinc oxide (DESITIN) 40 % ointment Apply topically as needed for irritation. 11/26/16   Barton Dubois, MD  methocarbamol (ROBAXIN) 500 MG tablet Take 1 tablet (500 mg total) by mouth every 8 (eight) hours as needed for muscle spasms. 11/26/16   Barton Dubois, MD  Metoprolol Tartrate 75 MG TABS Take 75 mg  by mouth 2 (two) times daily. 11/26/16   Barton Dubois, MD  Multiple Vitamin (MULTIVITAMIN) capsule Take 1 capsule by mouth daily.    Historical Provider, MD  oxyCODONE (OXY IR/ROXICODONE) 5 MG immediate release tablet Take 1-2 tablets (5-10 mg total) by mouth every 4 (four) hours as needed for moderate pain. 11/26/16   Barton Dubois, MD  polyethylene glycol [facility]) packet Take 17 g by mouth daily as needed for moderate constipation. 11/26/16   Barton Dubois, MD  ranitidine (ZANTAC) 150 MG capsule Take 150 mg by mouth as needed for heartburn.    Historical Provider, MD  tamsulosin (FLOMAX) 0.4 MG CAPS capsule Take 1 capsule (0.4 mg total) by mouth daily. 11/27/16   Barton Dubois, MD    Family History Family History  Problem Relation Age of Onset  . Brain cancer Mother   . Alzheimer's disease Father   . Cancer Maternal Grandmother   . Heart attack Maternal Grandfather   . Pneumonia Paternal Grandfather     Social History Social History  Substance Use Topics  . Smoking status: Former Smoker    Types: Pipe    Quit date: 09/14/1983  . Smokeless tobacco: Never Used  . Alcohol use No     Allergies   Other   Review of Systems Review of Systems  All other systems reviewed and are negative. A complete 10 system review of systems was obtained and all systems are negative except as noted in the HPI and PMH.     Physical Exam Updated Vital Signs BP 112/64   Pulse (!) 31   Temp 98 F (36.7 C) (Oral)   Resp 24   SpO2 97%   Physical Exam  Constitutional: He is oriented to person, place, and time. He appears well-developed and well-nourished.  HENT:  Head: Normocephalic and atraumatic.  Cardiovascular: Normal rate and regular rhythm.   No murmur heard. Pulmonary/Chest: Effort normal and breath sounds normal. No respiratory distress.  Abdominal: Soft. There is tenderness.  Healing surgical incisions with steristrips c/d/i.  Mild diffuse abdominal tenderness  Musculoskeletal:  He exhibits no edema or tenderness.  Neurological: He is alert and oriented to person, place, and time.  Skin: Skin is warm and dry. There is pallor.  Psychiatric: He has a normal mood and affect. His behavior is normal.  Nursing note and vitals reviewed.    ED Treatments / Results  DIAGNOSTIC STUDIES: Oxygen Saturation is 100% on RA, normal by my interpretation.    COORDINATION OF CARE: 7:17 AM Discussed treatment plan with pt at bedside and pt agreed to plan.  Labs (all labs ordered are listed, but only abnormal results are displayed) Labs Reviewed  COMPREHENSIVE METABOLIC PANEL - Abnormal; Notable for the following:       Result Value   Sodium 134 (*)    Chloride 100 (*)    Glucose, Bld 119 (*)    BUN 29 (*)    Calcium 8.4 (*)    Albumin 3.4 (*)    All other components within normal limits  CBC WITH DIFFERENTIAL/PLATELET - Abnormal; Notable for the following:    RBC 3.44 (*)    Hemoglobin 11.6 (*)    HCT 34.6 (*)    MCV 100.6 (*)    RDW 17.4 (*)    All other components within normal limits  URINALYSIS, ROUTINE W REFLEX MICROSCOPIC - Abnormal; Notable for the following:    Specific Gravity, Urine 1.033 (*)    Glucose, UA 50 (*)    All other components within normal limits  DIGOXIN LEVEL - Abnormal; Notable for the following:    Digoxin Level 0.2 (*)    All other components within normal limits  LIPASE, BLOOD    EKG  EKG Interpretation  Date/Time:  Saturday December 04 2016 00:53:47 EST Ventricular Rate:  90 PR Interval:    QRS Duration: 132 QT Interval:  416 QTC Calculation: 443 R Axis:   -78 Text Interpretation:  Atrial flutter Ventricular premature complex RBBB and LAFB Abnormal lateral Q waves Artifact in lead(s) I II III aVR aVL V1 V3 V4 V5 V6 Confirmed by Hazle Coca [phone]) on 12/04/2016 12:59:15 AM       Radiology Ct Abdomen Pelvis W Contrast  Result Date: 12/04/2016 CLINICAL DATA:  Abdominal pain EXAM: CT ABDOMEN AND PELVIS WITH CONTRAST  TECHNIQUE: Multidetector CT imaging of the abdomen and pelvis was performed using the standard protocol following bolus administration of intravenous contrast. CONTRAST:  110mL ISOVUE-300 IOPAMIDOL (ISOVUE-300) INJECTION 61% COMPARISON:  CT abdomen pelvis 11/08/2016 FINDINGS: Lower chest: There is free air within the anterior pneumomediastinum. Small bilateral pleural effusions. Hepatobiliary: Normal hepatic size and contours without focal liver lesion. No perihepatic ascites. No intra- or extrahepatic biliary dilatation. Normal gallbladder. Pancreas: Normal pancreatic contours and enhancement. No peripancreatic fluid collection or pancreatic ductal dilatation. Spleen: Multiple hypoattenuating foci within the spleen. These are unchanged from the prior study. Adrenals/Urinary Tract: Unchanged right adrenal nodule measuring 1.6 cm. Normal left adrenal gland. Normal right kidney. Multiple left renal sinus cysts. Stomach/Bowel: There is a large stool ball within the rectum. No evidence of stercoral inflammation. Moderate amount of stool within the descending colon. There is an anastomosis within the right lower quadrant. Within the dependent portion of the anastomotic segment, there is a focal area of hyperattenuation suggesting hemorrhage (series 2, image 46 and series 5, image 78). There is surrounding inflammatory stranding within the fat. There is a small amount of pneumoperitoneum in the right upper quadrant. Vascular/Lymphatic: There is atherosclerotic calcification of the non aneurysmal abdominal aorta. No abdominal or pelvic adenopathy. Reproductive: Normal prostate and seminal vesicles. Musculoskeletal: Bilateral sacroiliac bridging osteophytes. Multilevel lumbar facet arthrosis and osteophytosis. Other: Small amount of gas tracking into both inguinal canals. IMPRESSION: 1. Blood products layering within the dependent portion of the right lower quadrant anastomosis, concerning for hemorrhage at the operative  site. There is surrounding inflammatory change with a small amount of adjacent free fluid. There is no well-defined collection, but a small anastomotic leak would be difficult to exclude. 2. Small volume pneumoperitoneum and air extending into both inguinal canals in the anterior mediastinum is likely postoperative. 3. Small bilateral pleural effusions. 4. Aortic atherosclerosis. Electronically Signed   By: Ulyses Jarred M.D.   On: 12/04/2016 05:21    Procedures Procedures (including critical care time)  Medications Ordered in ED Medications  iopamidol (ISOVUE-300) 61 % injection (not administered)  sodium chloride 0.9 % bolus 500 mL (0 mLs Intravenous Stopped 12/04/16 0314)  dicyclomine (BENTYL) capsule 10 mg (10 mg Oral Given 12/04/16 0326)  iopamidol (ISOVUE-300) 61 % injection 100 mL (100 mLs Intravenous Contrast Given 12/04/16 0430)  morphine 4 MG/ML injection 4 mg (4 mg Intravenous Given 12/04/16 0542)  ondansetron (ZOFRAN) injection 4 mg (4 mg Intravenous Given 12/04/16 0542)     Initial Impression / Assessment and Plan / ED Course  I have reviewed the triage vital signs and the nursing notes.  Pertinent labs & imaging results that were available during my care of the patient were reviewed by me and considered in my medical decision making (see chart for details).  Clinical Course   Patient with history of recent partial colectomy for fecal impaction and polyp here with recurrent abdominal pain. He does have diffuse tenderness on examination without peritoneal findings. He refuses rectal examination. CT abdomen with postoperative changes and questionable anastomotic leak. Gen. surgery consulted for further recommendations.  Final Clinical Impressions(s) / ED Diagnoses   Final diagnoses:  None    New Prescriptions New Prescriptions   No medications on file  I personally performed the services described in this documentation, which was scribed in my presence. The recorded  information has been reviewed and is accurate.    Quintella Reichert, MD 12/04/16 [phone]

## 2016-12-04 NOTE — Progress Notes (Signed)
Report to Paula RN to assume care of patient at this time.  

## 2016-12-04 NOTE — ED Notes (Signed)
MD Lucia Gaskins info me PT HR will need to go down before OR.RN have been made aware

## 2016-12-04 NOTE — H&P (Addendum)
History and Physical    [name] [id number] DOB: 08/19/1951 DOA: 12/03/2016  Referring MD/NP/PA: Alphonsa Overall  PCP: Sandi Mariscal, MD  Patient coming from: Home/homeless   Chief Complaint: Rectal Pain, Fecal impaction, Afib w/ RVR   HPI: [name] is a 65 y.o. 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 Pt presents w/ worsening rectal pain over multiple days. Initially evaluated by general surgery via Dr. Lucia Gaskins today. Initial plan was for fecal disimpaction in OR. However, pt w/ noted to be in afib w/ RVR w/ HR in 120s. Pt currently denies any CP. States that atrial fibrillation has been longstanding issue. Reports compliance w/ medication.   ED Course: Presented to ER afebrile. HR into 120s. SBPs in 90s-low 100s. WBC 8.1, Hgb 11.6.   Review of Systems: As per HPI otherwise 10 point review of systems negative.   Past Medical History:  Diagnosis Date  . Atrial fibrillation ([place])   . Cataract   . Diabetes mellitus without complication ([place])   . Hyperlipidemia   . Hypertension   . Lichen planus    bilateral legs    Past Surgical History:  Procedure Laterality Date  . APPENDECTOMY    . COLON RESECTION N/A 11/22/2016   Procedure: HAND ASSISTED LAPAROSCOPIC COLON RESECTION;  Surgeon: [name], MD;  Location: [facility];  Service: General;  Laterality: N/A;  . COLONOSCOPY N/A 11/17/2016   Procedure: COLONOSCOPY;  Surgeon: [name], MD;  Location: [facility] END[facility]COPY;  Service: Endoscopy;  Laterality: N/A;  . FRACTURE SURGERY    . MULTIPLE TOOTH EXTRACTIONS       reports that he quit smoking about 33 years ago. His smoking use included Pipe. He has never used smokeless tobacco. He reports that he does not drink alcohol or use drugs.  Allergies  Allergen Reactions  . Other     [name] bandage (self-gripping) - erythema, itching    Family History  Problem Relation Age of Onset  . Brain cancer  Mother   . Alzheimer's disease Father   . Cancer Maternal Grandmother   . Heart attack Maternal Grandfather   . Pneumonia Paternal Grandfather    Unacceptable: Noncontributory, unremarkable, or negative. Acceptable: Family history reviewed and not pertinent (If you reviewed it)  Prior to Admission medications   Medication Sig Start Date End Date Taking? Authorizing Provider  aspirin EC 81 MG EC tablet Take 1 tablet (81 mg total) by mouth daily. 11/27/16   Barton Dubois, MD  busPIRone (BUSPAR) 7.5 MG tablet Take 1 tablet (7.5 mg total) by mouth 3 (three) times daily. 11/26/16   Barton Dubois, MD  digoxin (LANOXIN) 0.125 MG tablet Take 1 tablet (0.125 mg total) by mouth daily. 11/27/16   Barton Dubois, MD  feeding supplement, ENSURE ENLIVE, (ENSURE ENLIVE) LIQD Take 237 mLs by mouth 4 (four) times daily. 11/26/16   Barton Dubois, MD  hydrocortisone (ANUSOL-HC) 2.5 % rectal cream Place rectally every 12 (twelve) hours as needed for hemorrhoids or itching. 11/26/16   Barton Dubois, MD  liver oil-zinc oxide (DESITIN) 40 % ointment Apply topically as needed for irritation. 11/26/16   Barton Dubois, MD  methocarbamol (ROBAXIN) 500 MG tablet Take 1 tablet (500 mg total) by mouth every 8 (eight) hours as needed for muscle spasms. 11/26/16   Barton Dubois, MD  Metoprolol Tartrate 75 MG TABS Take 75 mg by mouth 2 (two) times daily. 11/26/16   Barton Dubois, MD  Multiple Vitamin (MULTIVITAMIN) capsule  Take 1 capsule by mouth daily.    Historical Provider, MD  oxyCODONE (OXY IR/ROXICODONE) 5 MG immediate release tablet Take 1-2 tablets (5-10 mg total) by mouth every 4 (four) hours as needed for moderate pain. 11/26/16   Barton Dubois, MD  polyethylene glycol [facility]) packet Take 17 g by mouth daily as needed for moderate constipation. 11/26/16   Barton Dubois, MD  ranitidine (ZANTAC) 150 MG capsule Take 150 mg by mouth as needed for heartburn.    Historical Provider, MD  tamsulosin (FLOMAX) 0.4 MG CAPS  capsule Take 1 capsule (0.4 mg total) by mouth daily. 11/27/16   Barton Dubois, MD    Physical Exam: Vitals:   12/04/16 0100 12/04/16 0200 12/04/16 0830 12/04/16 1011  BP: 115/61 112/64 106/80 94/61  Pulse: (!) 121 (!) 31 (!) 121 (!) 122  Resp: 18 24 13 17   Temp:      TempSrc:      SpO2: 97% 97% 99% 97%      Constitutional: NAD, calm, comfortable Vitals:   12/04/16 0100 12/04/16 0200 12/04/16 0830 12/04/16 1011  BP: 115/61 112/64 106/80 94/61  Pulse: (!) 121 (!) 31 (!) 121 (!) 122  Resp: 18 24 13 17   Temp:      TempSrc:      SpO2: 97% 97% 99% 97%   Eyes: PERRL, lids and conjunctivae normal ENMT: Mucous membranes are moist. Posterior pharynx clear of any exudate or lesions.Normal dentition.  Neck: normal, supple, no masses, no thyromegaly Respiratory: clear to auscultation bilaterally, no wheezing, no crackles. Normal respiratory effort. No accessory muscle use.  Cardiovascular: Regular rate and rhythm, no murmurs / rubs / gallops. No extremity edema. 2+ pedal pulses. No carotid bruits.  Abdomen: protuberant abdomen, minimal TTP, small periumbilical midline scar present  Musculoskeletal: no clubbing / cyanosis. No joint deformity upper and lower extremities. Good ROM, no contractures. Normal muscle tone.  Skin: no rashes, lesions, ulcers. No induration Neurologic: CN 2-12 grossly intact. Sensation intact, DTR normal. Strength 5/5 in all 4.  Psychiatric: Normal judgment and insight. Alert and oriented x 3. Normal mood.  Labs on Admission: I have personally reviewed following labs and imaging studies  CBC:  Recent Labs Lab 12/04/16 0040  WBC 8.1  NEUTROABS 6.0  HGB 11.6*  HCT 34.6*  MCV 100.6*  PLT [id number]   Basic Metabolic Panel:  Recent Labs Lab 12/04/16 0040  NA 134*  K 3.9  CL 100*  CO2 26  GLUCOSE 119*  BUN 29*  CREATININE 0.71  CALCIUM 8.4*   GFR: Estimated Creatinine Clearance: 110 mL/min (by C-G formula based on SCr of 0.71 mg/dL). Liver Function  Tests:  Recent Labs Lab 12/04/16 0040  AST 27  ALT 27  ALKPHOS 79  BILITOT 1.0  PROT 6.5  ALBUMIN 3.4*       Component Value Date/Time   COLORURINE YELLOW 12/04/2016 0624   APPEARANCEUR CLEAR 12/04/2016 0624   LABSPEC 1.033 (H) 12/04/2016 0624   PHURINE 5.0 12/04/2016 0624   GLUCOSEU 50 (A) 12/04/2016 0624   HGBUR NEGATIVE 12/04/2016 0624   BILIRUBINUR NEGATIVE 12/04/2016 0624   BILIRUBINUR NEG 05/27/2013 1516   KETONESUR NEGATIVE 12/04/2016 0624   PROTEINUR NEGATIVE 12/04/2016 0624   UROBILINOGEN 0.2 05/27/2013 1516   NITRITE NEGATIVE 12/04/2016 0624   LEUKOCYTESUR NEGATIVE 12/04/2016 0624    Radiological Exams on Admission: Ct Abdomen Pelvis W Contrast  Result Date: 12/04/2016 CLINICAL DATA:  Abdominal pain EXAM: CT ABDOMEN AND PELVIS WITH CONTRAST TECHNIQUE: Multidetector CT imaging of  the abdomen and pelvis was performed using the standard protocol following bolus administration of intravenous contrast. CONTRAST:  182mL ISOVUE-300 IOPAMIDOL (ISOVUE-300) INJECTION 61% COMPARISON:  CT abdomen pelvis 11/08/2016 FINDINGS: Lower chest: There is free air within the anterior pneumomediastinum. Small bilateral pleural effusions. Hepatobiliary: Normal hepatic size and contours without focal liver lesion. No perihepatic ascites. No intra- or extrahepatic biliary dilatation. Normal gallbladder. Pancreas: Normal pancreatic contours and enhancement. No peripancreatic fluid collection or pancreatic ductal dilatation. Spleen: Multiple hypoattenuating foci within the spleen. These are unchanged from the prior study. Adrenals/Urinary Tract: Unchanged right adrenal nodule measuring 1.6 cm. Normal left adrenal gland. Normal right kidney. Multiple left renal sinus cysts. Stomach/Bowel: There is a large stool ball within the rectum. No evidence of stercoral inflammation. Moderate amount of stool within the descending colon. There is an anastomosis within the right lower quadrant. Within the  dependent portion of the anastomotic segment, there is a focal area of hyperattenuation suggesting hemorrhage (series 2, image 46 and series 5, image 78). There is surrounding inflammatory stranding within the fat. There is a small amount of pneumoperitoneum in the right upper quadrant. Vascular/Lymphatic: There is atherosclerotic calcification of the non aneurysmal abdominal aorta. No abdominal or pelvic adenopathy. Reproductive: Normal prostate and seminal vesicles. Musculoskeletal: Bilateral sacroiliac bridging osteophytes. Multilevel lumbar facet arthrosis and osteophytosis. Other: Small amount of gas tracking into both inguinal canals. IMPRESSION: 1. Blood products layering within the dependent portion of the right lower quadrant anastomosis, concerning for hemorrhage at the operative site. There is surrounding inflammatory change with a small amount of adjacent free fluid. There is no well-defined collection, but a small anastomotic leak would be difficult to exclude. 2. Small volume pneumoperitoneum and air extending into both inguinal canals in the anterior mediastinum is likely postoperative. 3. Small bilateral pleural effusions. 4. Aortic atherosclerosis. Electronically Signed   By: Ulyses Jarred M.D.   On: 12/04/2016 05:21    EKG: Independently reviewed: atrial flutter w/ PVCs.   Assessment/Plan Active Problems:   Rectal pain    1-Fecal Impaction -pending surgical evaluation  -appreciate Dr. Pollie Friar input -f/u surgical recs   2-Atrial fibrillation w/ RVR -s/p IV metoprolol x1 in ER -noted consult note from 11/12/16 for similar presentation  -IV dig per pharmacy w/ cards consult  -2D ECHO 11/13/16 poor quality  -consider TEE    3-DM -currently well controlled given recent comorbities -most recent A1C 5.2  -follow       DVT prophylaxis: subq heparin   Code Status: Full Code  Disposition Plan: Pending further evaluation. Noted baseline homelessness. Anticipate >2MN stay   Consults called: Gen Surg (Dr. Lucia Gaskins)  Admission status: Inpt    Shanda Howells MD [facility] Pager [phone]  If 7PM-7AM, please contact night-coverage [email or web address] Password [email or web address]  12/04/2016, 11:13 AM  Addendum:  Case discussed w/ on call cards ([facility]). Will place on low dose IV metoprolol pending formal cardiology consult in am.

## 2016-12-04 NOTE — Anesthesia Preprocedure Evaluation (Signed)
Anesthesia Evaluation    Airway        Dental   Pulmonary former smoker,           Cardiovascular hypertension, Pt. on medications + dysrhythmias Atrial Fibrillation      Neuro/Psych    GI/Hepatic   Endo/Other  diabetes, Type 2  Renal/GU Renal InsufficiencyRenal disease     Musculoskeletal   Abdominal   Peds  Hematology   Anesthesia Other Findings Currently in a fib/flutter with RVR. BP 80's/50's. Spoke with Dr. Lucia Gaskins. Needs optimization prior to general anesthesia. Medicine to consult and admit  Reproductive/Obstetrics                            Anesthesia Physical Anesthesia Plan Anesthesia Quick Evaluation

## 2016-12-04 NOTE — ED Notes (Signed)
Pt was bladder scanned showed [id number]

## 2016-12-05 ENCOUNTER — Encounter (HOSPITAL_COMMUNITY): Payer: Self-pay | Admitting: Family Medicine

## 2016-12-05 ENCOUNTER — Encounter (HOSPITAL_COMMUNITY)
Admission: EM | Disposition: A | Discharge status: Skilled Nursing Facility | Payer: Self-pay | Source: Home / Self Care | Attending: Family Medicine

## 2016-12-05 ENCOUNTER — Inpatient Hospital Stay (HOSPITAL_COMMUNITY): Payer: PPO | Admitting: Registered Nurse

## 2016-12-05 HISTORY — PX: RECTAL EXAM UNDER ANESTHESIA: SHX6399

## 2016-12-05 LAB — COMPREHENSIVE METABOLIC PANEL
ALBUMIN: 2.8 g/dL — AB (ref 3.5–5.0)
ALT: 22 U/L (ref 17–63)
ANION GAP: 7 (ref 5–15)
AST: 18 U/L (ref 15–41)
Alkaline Phosphatase: 63 U/L (ref 38–126)
BUN: 18 mg/dL (ref 6–20)
CO2: 24 mmol/L (ref 22–32)
Calcium: 7.8 mg/dL — ABNORMAL LOW (ref 8.9–10.3)
Chloride: 107 mmol/L (ref 101–111)
Creatinine, Ser: 0.49 mg/dL — ABNORMAL LOW (ref 0.61–1.24)
GFR calc Af Amer: >60 mL/min (ref >60)
GFR calc non Af Amer: >60 mL/min (ref >60)
GLUCOSE: 85 mg/dL (ref 65–99)
Potassium: 3.8 mmol/L (ref 3.5–5.1)
SODIUM: 138 mmol/L (ref 135–145)
TOTAL PROTEIN: 5.2 g/dL — AB (ref 6.5–8.1)
Total Bilirubin: 0.8 mg/dL (ref 0.3–1.2)

## 2016-12-05 LAB — CBC
HCT: 28.8 % — ABNORMAL LOW (ref 39.0–52.0)
Hemoglobin: 9.7 g/dL — ABNORMAL LOW (ref 13.0–17.0)
MCH: 34.8 pg — ABNORMAL HIGH (ref 26.0–34.0)
MCHC: 33.7 g/dL (ref 30.0–36.0)
MCV: 103.2 fL — ABNORMAL HIGH (ref 78.0–100.0)
Platelets: 255 10*3/uL (ref 150–400)
RBC: 2.79 MIL/uL — AB (ref 4.22–5.81)
RDW: 17.2 % — AB (ref 11.5–15.5)
WBC: 4.2 10*3/uL (ref 4.0–10.5)

## 2016-12-05 LAB — GLUCOSE, CAPILLARY
GLUCOSE-CAPILLARY: 79 mg/dL (ref 65–99)
GLUCOSE-CAPILLARY: 71 mg/dL (ref 65–99)
Glucose-Capillary: 83 mg/dL (ref 65–99)
Glucose-Capillary: 89 mg/dL (ref 65–99)

## 2016-12-05 LAB — HEMOGLOBIN A1C
HEMOGLOBIN A1C: 4.9 % (ref 4.8–5.6)
Mean Plasma Glucose: 94 mg/dL

## 2016-12-05 SURGERY — EXAM UNDER ANESTHESIA, RECTUM
Anesthesia: General | Site: Rectum

## 2016-12-05 MED ORDER — TAMSULOSIN HCL 0.4 MG PO CAPS
0.4000 mg | ORAL_CAPSULE | Freq: Every day | ORAL | Status: DC
  Administered 2016-12-05 – 2016-12-08 (×4): 0.4 mg via ORAL
  Filled 2016-12-05 (×4): qty 1

## 2016-12-05 MED ORDER — METOPROLOL TARTRATE 5 MG/5ML IV SOLN: 2.5000 mg | Freq: Four times a day (QID) | INTRAVENOUS | Status: DC | PRN

## 2016-12-05 MED ORDER — PROPOFOL 10 MG/ML IV BOLUS
INTRAVENOUS | Status: DC | PRN
Start: 2016-12-05 — End: 2016-12-05
  Administered 2016-12-05: 150 mg via INTRAVENOUS

## 2016-12-05 MED ORDER — HYDROMORPHONE HCL 1 MG/ML IJ SOLN: 0.2500 mg | INTRAMUSCULAR | Status: DC | PRN

## 2016-12-05 MED ORDER — PROPOFOL 10 MG/ML IV BOLUS
INTRAVENOUS | Status: AC
  Filled 2016-12-05: qty 20

## 2016-12-05 MED ORDER — PROMETHAZINE HCL 25 MG/ML IJ SOLN: 6.2500 mg | INTRAMUSCULAR | Status: DC | PRN

## 2016-12-05 MED ORDER — LIDOCAINE HCL (CARDIAC) 20 MG/ML IV SOLN
INTRAVENOUS | Status: DC | PRN
  Administered 2016-12-05: 100 mg via INTRAVENOUS

## 2016-12-05 MED ORDER — DIGOXIN 125 MCG PO TABS
0.1250 mg | ORAL_TABLET | Freq: Every day | ORAL | Status: DC
  Administered 2016-12-05 – 2016-12-08 (×4): 0.125 mg via ORAL
  Filled 2016-12-05 (×5): qty 1

## 2016-12-05 MED ORDER — BUSPIRONE HCL 5 MG PO TABS
7.5000 mg | ORAL_TABLET | Freq: Three times a day (TID) | ORAL | Status: DC
  Administered 2016-12-05 – 2016-12-08 (×9): 7.5 mg via ORAL
  Filled 2016-12-05 (×9): qty 2

## 2016-12-05 MED ORDER — OXYCODONE HCL 5 MG PO TABS
5.0000 mg | ORAL_TABLET | ORAL | Status: DC | PRN
  Administered 2016-12-05: 5 mg via ORAL
  Filled 2016-12-05: qty 2
  Filled 2016-12-05: qty 1

## 2016-12-05 MED ORDER — ONDANSETRON HCL 4 MG/2ML IJ SOLN
INTRAMUSCULAR | Status: DC | PRN
  Administered 2016-12-05: 4 mg via INTRAVENOUS

## 2016-12-05 MED ORDER — MIDAZOLAM HCL 5 MG/5ML IJ SOLN
INTRAMUSCULAR | Status: DC | PRN
  Administered 2016-12-05: 2 mg via INTRAVENOUS

## 2016-12-05 MED ORDER — SUCCINYLCHOLINE CHLORIDE 200 MG/10ML IV SOSY
PREFILLED_SYRINGE | INTRAVENOUS | Status: DC | PRN
  Administered 2016-12-05: 100 mg via INTRAVENOUS

## 2016-12-05 MED ORDER — DOCUSATE SODIUM 100 MG PO CAPS
100.0000 mg | ORAL_CAPSULE | Freq: Every day | ORAL | Status: DC
  Administered 2016-12-05 – 2016-12-08 (×4): 100 mg via ORAL
  Filled 2016-12-05 (×4): qty 1

## 2016-12-05 MED ORDER — 0.9 % SODIUM CHLORIDE (POUR BTL) OPTIME
TOPICAL | Status: DC | PRN
  Administered 2016-12-05: 1000 mL

## 2016-12-05 MED ORDER — SUGAMMADEX SODIUM 200 MG/2ML IV SOLN
INTRAVENOUS | Status: AC
  Filled 2016-12-05: qty 2

## 2016-12-05 MED ORDER — ADULT MULTIVITAMIN W/MINERALS CH
1.0000 | ORAL_TABLET | Freq: Every day | ORAL | Status: DC
  Administered 2016-12-05 – 2016-12-08 (×4): 1 via ORAL
  Filled 2016-12-05 (×4): qty 1

## 2016-12-05 MED ORDER — LACTATED RINGERS IV SOLN
INTRAVENOUS | Status: DC
  Administered 2016-12-05: 15:00:00 via INTRAVENOUS

## 2016-12-05 MED ORDER — ESMOLOL HCL 100 MG/10ML IV SOLN
INTRAVENOUS | Status: AC
  Filled 2016-12-05: qty 10

## 2016-12-05 MED ORDER — ROCURONIUM BROMIDE 50 MG/5ML IV SOSY
PREFILLED_SYRINGE | INTRAVENOUS | Status: AC
  Filled 2016-12-05: qty 5

## 2016-12-05 MED ORDER — MIDAZOLAM HCL 2 MG/2ML IJ SOLN
INTRAMUSCULAR | Status: AC
  Filled 2016-12-05: qty 2

## 2016-12-05 MED ORDER — SUGAMMADEX SODIUM 200 MG/2ML IV SOLN
INTRAVENOUS | Status: DC | PRN
  Administered 2016-12-05: 200 mg via INTRAVENOUS

## 2016-12-05 MED ORDER — ROCURONIUM BROMIDE 10 MG/ML (PF) SYRINGE
PREFILLED_SYRINGE | INTRAVENOUS | Status: DC | PRN
  Administered 2016-12-05: 30 mg via INTRAVENOUS

## 2016-12-05 MED ORDER — SUCCINYLCHOLINE CHLORIDE 200 MG/10ML IV SOSY
PREFILLED_SYRINGE | INTRAVENOUS | Status: AC
  Filled 2016-12-05: qty 10

## 2016-12-05 MED ORDER — MEPERIDINE HCL 25 MG/ML IJ SOLN: 6.2500 mg | INTRAMUSCULAR | Status: DC | PRN

## 2016-12-05 MED ORDER — LIDOCAINE 2% (20 MG/ML) 5 ML SYRINGE
INTRAMUSCULAR | Status: AC
  Filled 2016-12-05: qty 5

## 2016-12-05 MED ORDER — FENTANYL CITRATE (PF) 100 MCG/2ML IJ SOLN
INTRAMUSCULAR | Status: DC | PRN
  Administered 2016-12-05 (×2): 50 ug via INTRAVENOUS

## 2016-12-05 MED ORDER — METOPROLOL TARTRATE 25 MG PO TABS
75.0000 mg | ORAL_TABLET | Freq: Two times a day (BID) | ORAL | Status: DC
  Administered 2016-12-05 – 2016-12-08 (×7): 75 mg via ORAL
  Filled 2016-12-05 (×7): qty 3

## 2016-12-05 MED ORDER — ONDANSETRON HCL 4 MG/2ML IJ SOLN
INTRAMUSCULAR | Status: AC
Start: 2016-12-05 — End: 2016-12-05
  Filled 2016-12-05: qty 2

## 2016-12-05 MED ORDER — ASPIRIN EC 81 MG PO TBEC
81.0000 mg | DELAYED_RELEASE_TABLET | Freq: Every day | ORAL | Status: DC
  Administered 2016-12-05 – 2016-12-08 (×4): 81 mg via ORAL
  Filled 2016-12-05 (×4): qty 1

## 2016-12-05 MED ORDER — FENTANYL CITRATE (PF) 250 MCG/5ML IJ SOLN
INTRAMUSCULAR | Status: AC
  Filled 2016-12-05: qty 5

## 2016-12-05 MED ORDER — LACTATED RINGERS IV SOLN
INTRAVENOUS | Status: DC | PRN
  Administered 2016-12-05: 11:00:00 via INTRAVENOUS

## 2016-12-05 SURGICAL SUPPLY — 26 items
BLADE HEX COATED 2.75 (ELECTRODE) ×3 IMPLANT
BLADE SURG 15 STRL LF DISP TIS (BLADE) ×1 IMPLANT
BLADE SURG 15 STRL SS (BLADE) ×2
COVER SURGICAL LIGHT HANDLE (MISCELLANEOUS) ×3 IMPLANT
ELECT PENCIL ROCKER SW 15FT (MISCELLANEOUS) ×3 IMPLANT
ELECT REM PT RETURN 9FT ADLT (ELECTROSURGICAL) ×3
ELECTRODE REM PT RTRN 9FT ADLT (ELECTROSURGICAL) ×1 IMPLANT
GAUZE SPONGE 4X4 12PLY STRL (GAUZE/BANDAGES/DRESSINGS) ×3 IMPLANT
GAUZE SPONGE 4X4 16PLY XRAY LF (GAUZE/BANDAGES/DRESSINGS) ×3 IMPLANT
GLOVE BIOGEL PI IND STRL 7.0 (GLOVE) ×1 IMPLANT
GLOVE BIOGEL PI INDICATOR 7.0 (GLOVE) ×2
GLOVE SURG SIGNA 7.5 PF LTX (GLOVE) ×6 IMPLANT
GOWN SPEC L4 XLG W/TWL (GOWN DISPOSABLE) ×3 IMPLANT
GOWN STRL REUS W/ TWL XL LVL3 (GOWN DISPOSABLE) ×3 IMPLANT
GOWN STRL REUS W/TWL LRG LVL3 (GOWN DISPOSABLE) ×3 IMPLANT
GOWN STRL REUS W/TWL XL LVL3 (GOWN DISPOSABLE) ×6
KIT BASIN OR (CUSTOM PROCEDURE TRAY) ×3 IMPLANT
NEEDLE HYPO 25X1 1.5 SAFETY (NEEDLE) IMPLANT
PACK LITHOTOMY IV (CUSTOM PROCEDURE TRAY) ×3 IMPLANT
SOL PREP PROV IODINE SCRUB 4OZ (MISCELLANEOUS) ×3 IMPLANT
SWAB COLLECTION DEVICE MRSA (MISCELLANEOUS) IMPLANT
SYR BULB IRRIGATION 50ML (SYRINGE) ×3 IMPLANT
SYR CONTROL 10ML LL (SYRINGE) IMPLANT
TOWEL OR 17X26 10 PK STRL BLUE (TOWEL DISPOSABLE) ×3 IMPLANT
UNDERPAD 30X30 INCONTINENT (UNDERPADS AND DIAPERS) ×9 IMPLANT
YANKAUER SUCT BULB TIP 10FT TU (MISCELLANEOUS) ×3 IMPLANT

## 2016-12-05 NOTE — Op Note (Signed)
12/03/2016 - 12/05/2016  10:53 AM  PATIENT:  [name], 65 y.o., male, MRN: [id number]  PREOP DIAGNOSIS:  Fecal bowel impaction  POSTOP DIAGNOSIS:   Fecal bowel impaction  PROCEDURE:   Procedure(s): RECTAL EXAM UNDER ANESTHESIA, DISIMPACTION, rigid sigmoidoscopy  SURGEON:   Alphonsa Overall, M.D.  ANESTHESIA:   general  Anesthesiologist: Nolon Nations, MD CRNA: [name] Lain, CRNA  General  EBL:  none  ml  BLOOD ADMINISTERED: none  COUNTS CORRECT:  YES  INDICATIONS FOR PROCEDURE:  [name] is a 65 y.o. (DOB: Jul 31, 1951) white male whose primary care physician is Sandi Mariscal, MD and comes for rectal disimpaction and exam under anesthesia.   The indications and risks of the surgery were explained to the patient.  The risks include, but are not limited to, infection, bleeding, and nerve injury.  PROCEDURE:  The patient was taken to operating room #1 at [facility]. He was given a general anesthetic.  A timeout was held and surgical checklist run.  He was placed in lithotomy position. His anus and buttocks prepped with Betadine. I did a digital exam and felt no mucosal or rectal mass. He had a large ball of stool impacted in his distal rectum which I removed manually.  I then passed the sigmoidoscope was able to break up more stool up to about 16 cm. He has no mucosal lesion or mass to cause an obstruction. He has a fairly loose rectal tone that easily admitted 3 fingers into his rectum.  He has some superficial tears around his anus from the impaction, but no significant ulcer or evidence of infection.  He was transferred to the recovery room in good condition.  Alphonsa Overall, MD, [facility] Surgery Pager: [phone] Office phone:  [phone]

## 2016-12-05 NOTE — Progress Notes (Signed)
PROGRESS NOTE  [name]  [id number] DOB: 23-Apr-1951 DOA: 12/03/2016 PCP: Sandi Mariscal, MD   Brief Narrative: [name] is a 65 y.o. male with a history of colon adenocarcinoma s/p right hemicolectomy 11/22/2016 sent from [facility] for abdominal and rectal pain. Fecal impaction was noted on CT and exam thought to be best managed by disimpaction under anesthesia. General surgery was consulted, though the patient's AFib developed rapid ventricular response and surgery was delayed. Cardiology was consulted and rate control medications were effective. He was taken to the OR 12/24 for successful manual disimpaction by Dr. Lucia Gaskins.  Assessment & Plan: Active Problems:   Rectal pain  Fecal Impaction: Treated with disimpaction under anesthesia by Dr. Lucia Gaskins 12/24.  - Appreciate ongoing surgical recommendations regarding diet - Pain control: Has not asked for prn medications  Atrial fibrillation w/ RVR: Exacerbated by pain, has resolved though I can't see that anything was actually administered for this. 2D ECHO 11/13/16 poor quality. Digoxin level low at 0.2.  - Rate controlled, will restart home medications now that postop: metoprolol 75mg  BID and digoxin 145mcg daily.  - Cardiology was consulted - Restart aspirin  History of diabetes: Seems to have resolved with weight loss. HbA1c checked 12/23: 4.9%.   - Monitor glucose on labwork  Adenocarcinoma (T1, N0; grade 1) of right colon: s/p hemicolectomy 12/11 by Dr. Zella Richer.  - Follow up as outpatient  Anemia: Mild, macrocytic 11.6 > 9.7 on admission with concomitant decrease in all cell lines consistent with hemodilution. - Monitor CBC in AM - Check B12, folate  BPH:  - Continue flomax - Monitor for post op urinary retention  Depression:  - Restart wellbutrin  Homelessness: Currently at SNF at [place] consulted  DVT prophylaxis: Heparin Code Status: Full Family Communication: None at bedside, declined offer to  call.  Disposition Plan: Continue post-operative care, discharge back to SNF in 24 - 48 hours.  Consultants:   General surgery, Dr. Lucia Gaskins  Procedures:   Manual disimpaction under anesthesia 12/24  Antimicrobials:  None   Subjective: Pt with rectal pain that is burning and moderate. No abd pain, no N/V. Wants to drink liquids.  Objective: Vitals:   12/05/16 0641 12/05/16 1100 12/05/16 1114 12/05/16 1115  BP: 117/81 114/78 (!) 125/100 103/79  Pulse: 93 76 (!) 146 80  Resp: 18  17 12   Temp: 97.8 F (36.6 C) 97.6 F (36.4 C)    TempSrc: Oral     SpO2: 99% 100% 100% 100%  Weight:      Height:        Intake/Output Summary (Last 24 hours) at 12/05/16 1136 Last data filed at 12/05/16 1050  Gross per 24 hour  Intake             3750 ml  Output             1625 ml  Net             2125 ml   Filed Weights   12/04/16 1234  Weight: 95.3 kg (210 lb 1.6 oz)    Examination: General exam: Chronically ill-appearing 65 y.o. male in no distress Respiratory system: Non-labored breathing 2L. Clear to auscultation bilaterally.  Cardiovascular system: Regular rate and rhythm. No murmur, rub, or gallop. No JVD, and no pedal edema. Gastrointestinal system: Abdomen soft, non-tender, non-distended, with normoactive bowel sounds. Midline lower incision and laparoscopic incision sites c/d/i with steristrips in midline incision. No organomegaly or masses felt. Central nervous system: Alert and  oriented. No focal neurological deficits. Extremities: Warm, no deformities Skin: No rashes, lesions ulcers Psychiatry: Judgement and insight appear normal. Mood & affect appropriate.   Data Reviewed: I have personally reviewed following labs and imaging studies  CBC:  Recent Labs Lab 12/04/16 0040 12/05/16 0510  WBC 8.1 4.2  NEUTROABS 6.0  --   HGB 11.6* 9.7*  HCT 34.6* 28.8*  MCV 100.6* 103.2*  PLT 334 [id number]   Basic Metabolic Panel:  Recent Labs Lab 12/04/16 0040 12/05/16 0510  NA  134* 138  K 3.9 3.8  CL 100* 107  CO2 26 24  GLUCOSE 119* 85  BUN 29* 18  CREATININE 0.71 0.49*  CALCIUM 8.4* 7.8*   GFR: Estimated Creatinine Clearance: 110 mL/min (by C-G formula based on SCr of 0.49 mg/dL (L)). Liver Function Tests:  Recent Labs Lab 12/04/16 0040 12/05/16 0510  AST 27 18  ALT 27 22  ALKPHOS 79 63  BILITOT 1.0 0.8  PROT 6.5 5.2*  ALBUMIN 3.4* 2.8*    Recent Labs Lab 12/04/16 0040  LIPASE 18   No results for input(s): AMMONIA in the last 168 hours. Coagulation Profile: No results for input(s): INR, PROTIME in the last 168 hours. Cardiac Enzymes: No results for input(s): CKTOTAL, CKMB, CKMBINDEX, TROPONINI in the last 168 hours. BNP (last 3 results) No results for input(s): PROBNP in the last 8760 hours. HbA1C:  Recent Labs  12/04/16 1537  HGBA1C 4.9   CBG:  Recent Labs Lab 12/04/16 1653 12/04/16 2137 12/05/16 0748  GLUCAP 85 86 79   Lipid Profile: No results for input(s): CHOL, HDL, LDLCALC, TRIG, CHOLHDL, LDLDIRECT in the last 72 hours. Thyroid Function Tests: No results for input(s): TSH, T4TOTAL, FREET4, T3FREE, THYROIDAB in the last 72 hours. Anemia Panel: No results for input(s): VITAMINB12, FOLATE, FERRITIN, TIBC, IRON, RETICCTPCT in the last 72 hours. Urine analysis:    Component Value Date/Time   COLORURINE YELLOW 12/04/2016 0624   APPEARANCEUR CLEAR 12/04/2016 0624   LABSPEC 1.033 (H) 12/04/2016 0624   PHURINE 5.0 12/04/2016 0624   GLUCOSEU 50 (A) 12/04/2016 0624   HGBUR NEGATIVE 12/04/2016 0624   BILIRUBINUR NEGATIVE 12/04/2016 0624   BILIRUBINUR NEG 05/27/2013 1516   KETONESUR NEGATIVE 12/04/2016 0624   PROTEINUR NEGATIVE 12/04/2016 0624   UROBILINOGEN 0.2 05/27/2013 1516   NITRITE NEGATIVE 12/04/2016 0624   LEUKOCYTESUR NEGATIVE 12/04/2016 0624   Sepsis Labs: @LABRCNTIP (procalcitonin:4,lacticidven:4)  )No results found for this or any previous visit (from the past 240 hour(s)).   Radiology Studies: Ct  Abdomen Pelvis W Contrast  Result Date: 12/04/2016 CLINICAL DATA:  Abdominal pain EXAM: CT ABDOMEN AND PELVIS WITH CONTRAST TECHNIQUE: Multidetector CT imaging of the abdomen and pelvis was performed using the standard protocol following bolus administration of intravenous contrast. CONTRAST:  172mL ISOVUE-300 IOPAMIDOL (ISOVUE-300) INJECTION 61% COMPARISON:  CT abdomen pelvis 11/08/2016 FINDINGS: Lower chest: There is free air within the anterior pneumomediastinum. Small bilateral pleural effusions. Hepatobiliary: Normal hepatic size and contours without focal liver lesion. No perihepatic ascites. No intra- or extrahepatic biliary dilatation. Normal gallbladder. Pancreas: Normal pancreatic contours and enhancement. No peripancreatic fluid collection or pancreatic ductal dilatation. Spleen: Multiple hypoattenuating foci within the spleen. These are unchanged from the prior study. Adrenals/Urinary Tract: Unchanged right adrenal nodule measuring 1.6 cm. Normal left adrenal gland. Normal right kidney. Multiple left renal sinus cysts. Stomach/Bowel: There is a large stool ball within the rectum. No evidence of stercoral inflammation. Moderate amount of stool within the descending colon. There is an anastomosis within  the right lower quadrant. Within the dependent portion of the anastomotic segment, there is a focal area of hyperattenuation suggesting hemorrhage (series 2, image 46 and series 5, image 78). There is surrounding inflammatory stranding within the fat. There is a small amount of pneumoperitoneum in the right upper quadrant. Vascular/Lymphatic: There is atherosclerotic calcification of the non aneurysmal abdominal aorta. No abdominal or pelvic adenopathy. Reproductive: Normal prostate and seminal vesicles. Musculoskeletal: Bilateral sacroiliac bridging osteophytes. Multilevel lumbar facet arthrosis and osteophytosis. Other: Small amount of gas tracking into both inguinal canals. IMPRESSION: 1. Blood  products layering within the dependent portion of the right lower quadrant anastomosis, concerning for hemorrhage at the operative site. There is surrounding inflammatory change with a small amount of adjacent free fluid. There is no well-defined collection, but a small anastomotic leak would be difficult to exclude. 2. Small volume pneumoperitoneum and air extending into both inguinal canals in the anterior mediastinum is likely postoperative. 3. Small bilateral pleural effusions. 4. Aortic atherosclerosis. Electronically Signed   By: Ulyses Jarred M.D.   On: 12/04/2016 05:21    Scheduled Meds: . heparin  5,000 Units Subcutaneous Q8H  . insulin aspart  0-5 Units Subcutaneous QHS  . insulin aspart  0-9 Units Subcutaneous TID WC  . metoprolol  5 mg Intravenous Once   Continuous Infusions: . sodium chloride 125 mL/hr at 12/05/16 0644     LOS: 1 day   Time spent: 25 minutes.  Vance Gather, MD [facility] Pager [phone]  If 7PM-7AM, please contact night-coverage [email or web address] Password [facility] 12/05/2016, 11:36 AM

## 2016-12-05 NOTE — Anesthesia Postprocedure Evaluation (Signed)
Anesthesia Post Note  Patient: [name]  Procedure(s) Performed: Procedure(s) (LRB): RECTAL EXAM UNDER ANESTHESIA, DISIMPACTION (N/A)  Patient location during evaluation: PACU Anesthesia Type: General Level of consciousness: sedated and patient cooperative Pain management: pain level controlled Vital Signs Assessment: post-procedure vital signs reviewed and stable Respiratory status: spontaneous breathing Cardiovascular status: stable Anesthetic complications: no       Last Vitals:  Vitals:   12/05/16 1145 12/05/16 1200  BP: 123/82 126/78  Pulse: 80 80  Resp: 16 16  Temp: 36.3 C     Last Pain:  Vitals:   12/05/16 0641  TempSrc: Oral  PainSc:                  Nolon Nations

## 2016-12-05 NOTE — Anesthesia Preprocedure Evaluation (Addendum)
Anesthesia Evaluation  Patient identified by MRN, date of birth, ID band Patient awake    Reviewed: Allergy & Precautions, NPO status , Patient's Chart, lab work & pertinent test results  Airway Mallampati: I  TM Distance: >3 FB Neck ROM: Full    Dental  (+) Edentulous Upper, Edentulous Lower   Pulmonary former smoker,    Pulmonary exam normal breath sounds clear to auscultation       Cardiovascular hypertension, Pt. on medications Normal cardiovascular exam+ dysrhythmias Atrial Fibrillation  Rhythm:Irregular Rate:Normal     Neuro/Psych negative neurological ROS  negative psych ROS   GI/Hepatic negative GI ROS, Neg liver ROS,   Endo/Other  diabetes, Type 2  Renal/GU Renal InsufficiencyRenal disease     Musculoskeletal negative musculoskeletal ROS (+)   Abdominal   Peds  Hematology negative hematology ROS (+)   Anesthesia Other Findings   Reproductive/Obstetrics negative OB ROS                           Anesthesia Physical  Anesthesia Plan  ASA: III  Anesthesia Plan: General   Post-op Pain Management:    Induction: Intravenous, Rapid sequence and Cricoid pressure planned  Airway Management Planned: Oral ETT  Additional Equipment:   Intra-op Plan:   Post-operative Plan: Extubation in OR  Informed Consent: I have reviewed the patients History and Physical, chart, labs and discussed the procedure including the risks, benefits and alternatives for the proposed anesthesia with the patient or authorized representative who has indicated his/her understanding and acceptance.   Dental advisory given  Plan Discussed with: CRNA  Anesthesia Plan Comments:        Anesthesia Quick Evaluation

## 2016-12-05 NOTE — Anesthesia Procedure Notes (Signed)
Procedure Name: Intubation Date/Time: 12/05/2016 10:20 AM Performed by: Carleene Cooper A Pre-anesthesia Checklist: Patient identified, Timeout performed, Emergency Drugs available, Suction available and Patient being monitored Patient Re-evaluated:Patient Re-evaluated prior to inductionOxygen Delivery Method: Circle system utilized Preoxygenation: Pre-oxygenation with 100% oxygen (RSi with cricoid pressure by Dr. Lissa Hoard) Intubation Type: IV induction, Cricoid Pressure applied and Rapid sequence Laryngoscope Size: Mac and 4 Grade View: Grade I Tube type: Oral Tube size: 7.5 mm Number of attempts: 1 Airway Equipment and Method: Stylet Placement Confirmation: ETT inserted through vocal cords under direct vision,  positive ETCO2 and breath sounds checked- equal and bilateral Secured at: 23 cm Tube secured with: Tape Dental Injury: Teeth and Oropharynx as per pre-operative assessment

## 2016-12-05 NOTE — Transfer of Care (Signed)
Immediate Anesthesia Transfer of Care Note  Patient: [name]  Procedure(s) Performed: Procedure(s): RECTAL EXAM UNDER ANESTHESIA, DISIMPACTION (N/A)  Patient Location: PACU  Anesthesia Type:General  Level of Consciousness: awake, alert , oriented and patient cooperative  Airway & Oxygen Therapy: Patient Spontanous Breathing and Patient connected to face mask oxygen  Post-op Assessment: Report given to RN, Post -op Vital signs reviewed and stable and Patient moving all extremities  Post vital signs: Reviewed and stable  Last Vitals:  Vitals:   12/04/16 2138 12/05/16 0641  BP: 101/69 117/81  Pulse: 94 93  Resp: 18 18  Temp: 37.2 C 36.6 C    Last Pain:  Vitals:   12/05/16 0641  TempSrc: Oral  PainSc:       Patients Stated Pain Goal: 4 ([id number])  Complications: No apparent anesthesia complications

## 2016-12-05 NOTE — Progress Notes (Addendum)
[facility] Office:  [phone] General Surgery Progress Note   LOS: 1 day  POD -  1 Day Post-Op  Assessment/Plan: 1.  Rectal pain  Impacted - passed minimal air last 24 hours             Plan to take to the OR for exam under anesthesia and disimpaction.  2.  Abdominal pain             Odd air in mediastinum/upper abdomen and in inguinal canal - this must be related to his surgery.  His WBC is 4,200, he has no significant abdominal symptoms 3.  Right hemicolectomy - 11/22/2016 - T. 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 Fuller Plan - GI 4.  History of A. Fib             On metoprolol and digoxin, does not see a cardiologist regularly             Digoxin level - 0.2 (low normal - 0.8)             Hospitalist admitted - rate better controlled 5.  Homeless 6.  BPH 7.  Malnutrition  Albumin - 2.8 - 12/05/2016 8.  History of glucose intolerance (better since he had significant weight loss)             Last HgbA1C - 5.2 - 11/09/2016 9.  Depression 10.  DVT prophylaxis - SQ Heparin 11.  MRSA - on isolation 12.  Has foley 13.  Anemia - Hgb - 0.7 - 12/05/2016   Active Problems:   Rectal pain  Subjective:  Not much different than yesterday.  He is not really hungry.  Still has rectal pain.  He has not been out of bed.  He is becoming increasingly deconditioned.  Post op will need to push to get him out of bed and involve PT.  Objective:   Vitals:   12/04/16 2138 12/05/16 0641  BP: 101/69 117/81  Pulse: 94 93  Resp: 18 18  Temp: 98.9 F (37.2 C) 97.8 F (36.6 C)     Intake/Output from previous day:  12/23 0701 - 12/24 0700 In: 1702.1 [I.V.:1702.1] Out: 1625 [Urine:1625]  Intake/Output this shift:  No intake/output data recorded.   Physical Exam:   General: Bearded WM who is alert and oriented.    HEENT: Normal. Pupils equal. .   Lungs: Clear   Abdomen: Soft.  Wound okay.  Has foley.  I did    Lab  Results:    Recent Labs  12/04/16 0040 12/05/16 0510  WBC 8.1 4.2  HGB 11.6* 9.7*  HCT 34.6* 28.8*  PLT 334 255    BMET   Recent Labs  12/04/16 0040 12/05/16 0510  NA 134* 138  K 3.9 3.8  CL 100* 107  CO2 26 24  GLUCOSE 119* 85  BUN 29* 18  CREATININE 0.71 0.49*  CALCIUM 8.4* 7.8*    PT/INR  No results for input(s): LABPROT, INR in the last 72 hours.  ABG  No results for input(s): PHART, HCO3 in the last 72 hours.  Invalid input(s): PCO2, PO2   Studies/Results:  Ct Abdomen Pelvis W Contrast  Result Date: 12/04/2016 CLINICAL DATA:  Abdominal pain EXAM: CT ABDOMEN AND PELVIS WITH CONTRAST TECHNIQUE: Multidetector CT imaging of the abdomen and pelvis was performed  using the standard protocol following bolus administration of intravenous contrast. CONTRAST:  181mL ISOVUE-300 IOPAMIDOL (ISOVUE-300) INJECTION 61% COMPARISON:  CT abdomen pelvis 11/08/2016 FINDINGS: Lower chest: There is free air within the anterior pneumomediastinum. Small bilateral pleural effusions. Hepatobiliary: Normal hepatic size and contours without focal liver lesion. No perihepatic ascites. No intra- or extrahepatic biliary dilatation. Normal gallbladder. Pancreas: Normal pancreatic contours and enhancement. No peripancreatic fluid collection or pancreatic ductal dilatation. Spleen: Multiple hypoattenuating foci within the spleen. These are unchanged from the prior study. Adrenals/Urinary Tract: Unchanged right adrenal nodule measuring 1.6 cm. Normal left adrenal gland. Normal right kidney. Multiple left renal sinus cysts. Stomach/Bowel: There is a large stool ball within the rectum. No evidence of stercoral inflammation. Moderate amount of stool within the descending colon. There is an anastomosis within the right lower quadrant. Within the dependent portion of the anastomotic segment, there is a focal area of hyperattenuation suggesting hemorrhage (series 2, image 46 and series 5, image 78). There is  surrounding inflammatory stranding within the fat. There is a small amount of pneumoperitoneum in the right upper quadrant. Vascular/Lymphatic: There is atherosclerotic calcification of the non aneurysmal abdominal aorta. No abdominal or pelvic adenopathy. Reproductive: Normal prostate and seminal vesicles. Musculoskeletal: Bilateral sacroiliac bridging osteophytes. Multilevel lumbar facet arthrosis and osteophytosis. Other: Small amount of gas tracking into both inguinal canals. IMPRESSION: 1. Blood products layering within the dependent portion of the right lower quadrant anastomosis, concerning for hemorrhage at the operative site. There is surrounding inflammatory change with a small amount of adjacent free fluid. There is no well-defined collection, but a small anastomotic leak would be difficult to exclude. 2. Small volume pneumoperitoneum and air extending into both inguinal canals in the anterior mediastinum is likely postoperative. 3. Small bilateral pleural effusions. 4. Aortic atherosclerosis. Electronically Signed   By: Ulyses Jarred M.D.   On: 12/04/2016 05:21     Anti-infectives:   Anti-infectives    None      Alphonsa Overall, MD, FACS Pager: [phone] Surgery Office: [phone] 12/05/2016

## 2016-12-05 NOTE — Progress Notes (Signed)
Anesthesia called to bedside to evaluate cardiac rhythm,  Patient asymptomatic. Jonnie Kind, CRNA here initially , followed by Dr. Lissa Hoard. No orders received

## 2016-12-06 LAB — GLUCOSE, CAPILLARY
GLUCOSE-CAPILLARY: 74 mg/dL (ref 65–99)
GLUCOSE-CAPILLARY: 94 mg/dL (ref 65–99)
Glucose-Capillary: 143 mg/dL — ABNORMAL HIGH (ref 65–99)
Glucose-Capillary: 128 mg/dL — ABNORMAL HIGH (ref 65–99)

## 2016-12-06 LAB — FOLATE: FOLATE: 10.8 ng/mL (ref >5.9)

## 2016-12-06 LAB — BASIC METABOLIC PANEL
ANION GAP: 7 (ref 5–15)
BUN: 9 mg/dL (ref 6–20)
CALCIUM: 8.1 mg/dL — AB (ref 8.9–10.3)
CO2: 24 mmol/L (ref 22–32)
Chloride: 108 mmol/L (ref 101–111)
Creatinine, Ser: 0.51 mg/dL — ABNORMAL LOW (ref 0.61–1.24)
GLUCOSE: 80 mg/dL (ref 65–99)
POTASSIUM: 4 mmol/L (ref 3.5–5.1)
SODIUM: 139 mmol/L (ref 135–145)

## 2016-12-06 LAB — CBC
HCT: 31.2 % — ABNORMAL LOW (ref 39.0–52.0)
Hemoglobin: 10.5 g/dL — ABNORMAL LOW (ref 13.0–17.0)
MCH: 35 pg — ABNORMAL HIGH (ref 26.0–34.0)
MCHC: 33.7 g/dL (ref 30.0–36.0)
MCV: 104 fL — ABNORMAL HIGH (ref 78.0–100.0)
PLATELETS: 255 10*3/uL (ref 150–400)
RBC: 3 MIL/uL — AB (ref 4.22–5.81)
RDW: 17.1 % — AB (ref 11.5–15.5)
WBC: 5 10*3/uL (ref 4.0–10.5)

## 2016-12-06 LAB — VITAMIN B12: Vitamin B-12: 211 pg/mL (ref 180–914)

## 2016-12-06 MED ORDER — TRAMADOL HCL 50 MG PO TABS: 50.0000 mg | ORAL_TABLET | Freq: Four times a day (QID) | ORAL | Status: DC | PRN

## 2016-12-06 MED ORDER — VITAMIN B-12 1000 MCG PO TABS
1000.0000 ug | ORAL_TABLET | Freq: Every day | ORAL | Status: DC
  Administered 2016-12-06 – 2016-12-08 (×3): 1000 ug via ORAL
  Filled 2016-12-06 (×3): qty 1

## 2016-12-06 MED ORDER — SODIUM CHLORIDE 0.9% FLUSH: 3.0000 mL | INTRAVENOUS | Status: DC | PRN

## 2016-12-06 NOTE — Progress Notes (Signed)
[facility] Office:  [phone] General Surgery Progress Note   LOS: 2 days  POD -  1 Day Post-Op  Assessment/Plan: 1.  Rectal pain  Disimpaction - 12/05/2016  To advance diet.  He can ear regular food, but he wants to go "slow" - so I put him on full liquids.  He needs to walk and get out of bed - he'll need to be pushed  2.  Right hemicolectomy - 11/22/2016 - T. 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 Fuller Plan - GI 3.  History of A. Fib             On metoprolol and digoxin, does not see a cardiologist regularly             Digoxin level - 0.2 (low normal - 0.8) 4.  Homeless 5.  BPH 6.  Malnutrition  Albumin - 2.8 - 12/05/2016 7.  History of glucose intolerance (better since he had significant weight loss)             Last HgbA1C - 5.2 - 11/09/2016 8.  Depression 9.  DVT prophylaxis - SQ Heparin 10.  MRSA - on isolation 11.  Has foley - to remove 12.  Anemia - Hgb - 10.5 - 12/06/2016 13.  Deconditioned  Needs to be pushed to be active   Principal Problem:   Fecal impaction [facility]) Active Problems:   Atrial fibrillation ([place])   BPH with obstruction/lower urinary tract symptoms   Rectal pain  Subjective:  Worried about advancing diet too fast.  Needs to ambulate.  Very passive in his physical activity.  Will need to be pushed.  Can go home from my standpoint - possibly tomorrow?  Came from [place] - and will need to return to rehab/nursing facility.  Objective:   Vitals:   12/05/16 2153 12/06/16 0557  BP: 112/71 114/75  Pulse: 82 85  Resp: 20 18  Temp: 98.4 F (36.9 C) 97.5 F (36.4 C)     Intake/Output from previous day:  12/24 0701 - 12/25 0700 In: 3200 [P.O.:1300; I.V.:1900] Out: 4325 [Urine:4325]  Intake/Output this shift:  No intake/output data recorded.   Physical Exam:   General: Bearded WM who is alert and oriented.    HEENT: Normal. Pupils equal. .   Lungs: Clear   Abdomen:  Soft.  Wound okay.  Has foley.    Lab Results:     Recent Labs  12/05/16 0510 12/06/16 0544  WBC 4.2 5.0  HGB 9.7* 10.5*  HCT 28.8* 31.2*  PLT 255 255    BMET    Recent Labs  12/05/16 0510 12/06/16 0544  NA 138 139  K 3.8 4.0  CL 107 108  CO2 24 24  GLUCOSE 85 80  BUN 18 9  CREATININE 0.49* 0.51*  CALCIUM 7.8* 8.1*    PT/INR  No results for input(s): LABPROT, INR in the last 72 hours.  ABG  No results for input(s): PHART, HCO3 in the last 72 hours.  Invalid input(s): PCO2, PO2   Studies/Results:  No results found.   Anti-infectives:   Anti-infectives    None      Alphonsa Overall, MD, FACS Pager: [phone] Surgery Office: [phone] 12/06/2016

## 2016-12-06 NOTE — Evaluation (Signed)
Physical Therapy Evaluation Patient Details Name: [name] MRN: [id number] DOB: [birth year] Today's Date: 12/06/2016   History of Present Illness  Pt admitted from SNF level rehab and underwent bowel disimpaction under anesthesia.  Pt with hx of DM, A-fib and recent colon resection  Clinical Impression  Pt admitted as above and presenting with functional mobility limitations 2* generalized weakness and bowel incontinence.  Pt plans dc back to SNF to complete post colon resection rehab.    Follow Up Recommendations SNF;Supervision for mobility/OOB    Equipment Recommendations  None recommended by PT    Recommendations for Other Services       Precautions / Restrictions Precautions Precautions: Fall Restrictions Weight Bearing Restrictions: No      Mobility  Bed Mobility Overal bed mobility: Needs Assistance Bed Mobility: Supine to Sit     Supine to sit: Min assist     General bed mobility comments: min assist to bring trunk to upright  Transfers Overall transfer level: Needs assistance Equipment used: Rolling walker (2 wheeled) Transfers: Sit to/from Stand Sit to Stand: Min assist;+2 physical assistance;+2 safety/equipment;From elevated surface         General transfer comment: min assist from elevated bed and min assist x 2 from low recliner  Ambulation/Gait Ambulation/Gait assistance: Min guard Ambulation Distance (Feet): 400 Feet Assistive device: Rolling walker (2 wheeled) Gait Pattern/deviations: Step-through pattern;Decreased step length - right;Decreased step length - left;Shuffle;Trunk flexed     General Gait Details: cues for posture and positon from [employer] Mobility    Modified Rankin (Stroke Patients Only)       Balance Overall balance assessment: No apparent balance deficits (not formally assessed)                                           Pertinent Vitals/Pain Pain Assessment:  0-10 Pain Score: 5  Pain Location: Buttocks 2* recent disimpaction Pain Descriptors / Indicators: Grimacing Pain Intervention(s): Limited activity within patient's tolerance;Monitored during session    Home Living Family/patient expects to be discharged to:: Skilled nursing facility                      Prior Function Level of Independence: Needs assistance   Gait / Transfers Assistance Needed: min assist and use of RW     Comments: Pt has been in SNF level rehab since recent dc from [place] Hand: Right    Extremity/Trunk Assessment   Upper Extremity Assessment Upper Extremity Assessment: Generalized weakness    Lower Extremity Assessment Lower Extremity Assessment: Generalized weakness       Communication   Communication: No difficulties  Cognition Arousal/Alertness: Awake/alert Behavior During Therapy: Anxious Overall Cognitive Status: Within Functional Limits for tasks assessed                      General Comments      Exercises     Assessment/Plan    PT Assessment Patient needs continued PT services  PT Problem List Decreased strength;Decreased activity tolerance;Decreased mobility;Pain          PT Treatment Interventions DME instruction;Gait training;Functional mobility training;Therapeutic exercise;Therapeutic activities;Patient/family education    PT Goals (Current goals can be found in the Care Plan section)  Acute Rehab  PT Goals Patient Stated Goal: "get my bowels under control" PT Goal Formulation: With patient Time For Goal Achievement: 11/29/16 Potential to Achieve Goals: Good    Frequency Min 3X/week   Barriers to discharge        Co-evaluation               End of Session Equipment Utilized During Treatment: Gait belt Activity Tolerance: Patient tolerated treatment well Patient left: in chair;with call bell/phone within reach;with chair alarm set Nurse Communication: Mobility  status         Time: 1130-1202 PT Time Calculation (min) (ACUTE ONLY): 32 min   Charges:   PT Evaluation $PT Eval Low Complexity: 1 Procedure PT Treatments $Gait Training: 8-22 mins   PT G Codes:        [name] December 29, 2016, 1:28 PM

## 2016-12-06 NOTE — Progress Notes (Signed)
PROGRESS NOTE  [name]  [id number] DOB: 04-Mar-1951 DOA: 12/03/2016 PCP: Sandi Mariscal, MD   Brief Narrative: [name] is a 65 y.o. 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 Fecal impaction was noted on CT and exam thought to be best managed by disimpaction under anesthesia. General surgery was consulted, though the patient's AFib developed rapid ventricular response and surgery was delayed. Cardiology was consulted and rate control medications were effective. He was taken to the OR 12/24 for successful manual disimpaction by Dr. Lucia Gaskins.  Assessment & Plan: Principal Problem:   Fecal impaction [facility]) Active Problems:   Atrial fibrillation (H[facility])   BPH with obstruction/lower urinary tract symptoms   Rectal pain  Fecal Impaction: Treated with disimpaction under anesthesia by Dr. Lucia Gaskins 12/24.  - Appreciate ongoing surgical recommendations regarding diet: Pt wants to go slow, feels he was pushed too quickly following previous surgery.  - Full liquids today - Pain control: Deescalate to tramadol to minimize narcotics. Needed only 1 dose oxycodone on 12/24.  Atrial fibrillation w/ RVR: Exacerbated by pain, has resolved. 2D ECHO 11/13/16 poor quality. Digoxin level low at 0.2 (target technically just < 2 for AFib), unsure if it's been administered long enough for true level.  - Rate controlled, continue: metoprolol 75mg  BID and digoxin 114mcg daily.  - Cardiology was consulted but has not needed to see the patient. - Continue ASA.  - Consider digoxin level recheck as outpatient.  History of diabetes: Seems to have resolved with weight loss. HbA1c checked 12/23: 4.9%.   - Monitor glucose on labwork  Adenocarcinoma (T1, N0; grade 1) of right colon: s/p hemicolectomy 12/11 by Dr. Zella Richer.  - Follow up as outpatient  Anemia: Mild, macrocytic 11.6 > 9.7 on admission with concomitant decrease  in all cell lines consistent with hemodilution. - Monitor CBC in AM - Vitamin B12 very low-normal at 211, folate 10.8. Will start B12 supplement.   BPH:  - Continue flomax - Monitor for post op urinary retention  Depression:  - Restarted wellbutrin  Homelessness: Currently at SNF at [place] consulted  DVT prophylaxis: Heparin Code Status: Full Family Communication: None at bedside, declined offer to call.  Disposition Plan: Continue post-operative care, discharge back to SNF in 24 - 48 hours.  Consultants:   General surgery, Dr. Lucia Gaskins  Procedures:   Manual disimpaction under anesthesia 12/24  Antimicrobials:  None   Subjective: Pt with rectal pain that is burning, constant and mild. Has not been ambulating. No abd pain, no N/V. Still very hesitant to advance diet.  Objective: Vitals:   12/05/16 1645 12/05/16 2153 12/06/16 0557 12/06/16 1053  BP: 123/72 112/71 114/75 115/72  Pulse: 60 82 85   Resp: 20 20 18    Temp: 98.8 F (37.1 C) 98.4 F (36.9 C) 97.5 F (36.4 C)   TempSrc: Oral Oral Oral   SpO2: 100% 99% 100%   Weight:      Height:        Intake/Output Summary (Last 24 hours) at 12/06/16 1134 Last data filed at 12/06/16 0615  Gross per 24 hour  Intake             2200 ml  Output             4325 ml  Net            -2125 ml   Filed Weights   12/04/16 1234  Weight: 95.3  kg (210 lb 1.6 oz)    Examination: General exam: Chronically ill-appearing 65 y.o. male in no distress Respiratory system: Non-labored breathing 2L. Clear to auscultation bilaterally.  Cardiovascular system: Regular rate and rhythm. No murmur, rub, or gallop. No JVD, and no pedal edema. Gastrointestinal system: Abdomen soft, non-tender, non-distended, with normoactive bowel sounds. Midline lower incision and laparoscopic incision sites c/d/i with steristrips in midline incision. No organomegaly or masses felt. Central nervous system: Alert and oriented. No focal neurological  deficits. Extremities: Warm, no deformities Skin: No rashes, lesions ulcers Psychiatry: Judgement and insight appear normal. Mood & affect appropriate.   Data Reviewed: I have personally reviewed following labs and imaging studies  CBC:  Recent Labs Lab 12/04/16 0040 12/05/16 0510 12/06/16 0544  WBC 8.1 4.2 5.0  NEUTROABS 6.0  --   --   HGB 11.6* 9.7* 10.5*  HCT 34.6* 28.8* 31.2*  MCV 100.6* 103.2* 104.0*  PLT 334 255 [id number]   Basic Metabolic Panel:  Recent Labs Lab 12/04/16 0040 12/05/16 0510 12/06/16 0544  NA 134* 138 139  K 3.9 3.8 4.0  CL 100* 107 108  CO2 26 24 24   GLUCOSE 119* 85 80  BUN 29* 18 9  CREATININE 0.71 0.49* 0.51*  CALCIUM 8.4* 7.8* 8.1*   GFR: Estimated Creatinine Clearance: 110 mL/min (by C-G formula based on SCr of 0.51 mg/dL (L)). Liver Function Tests:  Recent Labs Lab 12/04/16 0040 12/05/16 0510  AST 27 18  ALT 27 22  ALKPHOS 79 63  BILITOT 1.0 0.8  PROT 6.5 5.2*  ALBUMIN 3.4* 2.8*    Recent Labs Lab 12/04/16 0040  LIPASE 18   No results for input(s): AMMONIA in the last 168 hours. Coagulation Profile: No results for input(s): INR, PROTIME in the last 168 hours. Cardiac Enzymes: No results for input(s): CKTOTAL, CKMB, CKMBINDEX, TROPONINI in the last 168 hours. BNP (last 3 results) No results for input(s): PROBNP in the last 8760 hours. HbA1C:  Recent Labs  12/04/16 1537  HGBA1C 4.9   CBG:  Recent Labs Lab 12/05/16 0748 12/05/16 1225 12/05/16 1628 12/05/16 2152 12/06/16 0746  GLUCAP 79 83 71 89 74   Lipid Profile: No results for input(s): CHOL, HDL, LDLCALC, TRIG, CHOLHDL, LDLDIRECT in the last 72 hours. Thyroid Function Tests: No results for input(s): TSH, T4TOTAL, FREET4, T3FREE, THYROIDAB in the last 72 hours. Anemia Panel:  Recent Labs  12/06/16 0544 12/06/16 0545  VITAMINB12 211  --   FOLATE  --  10.8   Urine analysis:    Component Value Date/Time   COLORURINE YELLOW 12/04/2016 0624    APPEARANCEUR CLEAR 12/04/2016 0624   LABSPEC 1.033 (H) 12/04/2016 0624   PHURINE 5.0 12/04/2016 0624   GLUCOSEU 50 (A) 12/04/2016 0624   HGBUR NEGATIVE 12/04/2016 0624   BILIRUBINUR NEGATIVE 12/04/2016 0624   BILIRUBINUR NEG 05/27/2013 1516   KETONESUR NEGATIVE 12/04/2016 0624   PROTEINUR NEGATIVE 12/04/2016 0624   UROBILINOGEN 0.2 05/27/2013 1516   NITRITE NEGATIVE 12/04/2016 0624   LEUKOCYTESUR NEGATIVE 12/04/2016 0624   Sepsis Labs: @LABRCNTIP (procalcitonin:4,lacticidven:4)  )No results found for this or any previous visit (from the past 240 hour(s)).   Radiology Studies: No results found.  Scheduled Meds: . aspirin EC  81 mg Oral Daily  . busPIRone  7.5 mg Oral TID  . digoxin  0.125 mg Oral Daily  . docusate sodium  100 mg Oral Daily  . heparin  5,000 Units Subcutaneous Q8H  . insulin aspart  0-5 Units Subcutaneous QHS  .  insulin aspart  0-9 Units Subcutaneous TID WC  . metoprolol  5 mg Intravenous Once  . metoprolol tartrate  75 mg Oral BID  . multivitamin with minerals  1 tablet Oral Daily  . tamsulosin  0.4 mg Oral Daily   Continuous Infusions:    LOS: 2 days   Time spent: 25 minutes.  Vance Gather, MD [facility] Pager [phone]  If 7PM-7AM, please contact night-coverage [email or web address] Password [facility] 12/06/2016, 11:34 AM

## 2016-12-07 ENCOUNTER — Encounter (HOSPITAL_COMMUNITY): Payer: Self-pay | Admitting: Surgery

## 2016-12-07 LAB — GLUCOSE, CAPILLARY
GLUCOSE-CAPILLARY: 109 mg/dL — AB (ref 65–99)
GLUCOSE-CAPILLARY: 152 mg/dL — AB (ref 65–99)
Glucose-Capillary: 130 mg/dL — ABNORMAL HIGH (ref 65–99)
Glucose-Capillary: 136 mg/dL — ABNORMAL HIGH (ref 65–99)

## 2016-12-07 MED ORDER — SENNA 8.6 MG PO TABS: 1.0000 | ORAL_TABLET | Freq: Two times a day (BID) | ORAL | Status: DC | PRN

## 2016-12-07 NOTE — Progress Notes (Signed)
OT Cancellation Note  Patient Details Name: [name] MRN: [id number] DOB: [birth year]   Cancelled Treatment:      Noted pt is from a SNF and returning to SNF- will defer OT eval to therapist at [facility] Thanks, Kari Baars, [place]  Payton Mccallum D 12/07/2016, 1:07 PM

## 2016-12-07 NOTE — Progress Notes (Signed)
PROGRESS NOTE  [name]  [id number] DOB: 02-20-1951 DOA: 12/03/2016 PCP: Sandi Mariscal, MD   Brief Narrative: [name] is a 65 y.o. male with a history of colon adenocarcinoma s/p right hemicolectomy 11/22/2016 sent from [facility] for abdominal and rectal pain. Fecal impaction was noted on CT and exam thought to be best managed by disimpaction under anesthesia. General surgery was consulted, though the patient's AFib developed rapid ventricular response and surgery was delayed. Cardiology was consulted and rate control medications were effective. He was taken to the OR 12/24 for successful manual disimpaction by Dr. Lucia Gaskins. He has had an uneventful postoperative course tolerating an advancing diet, passing flatus but no BM yet. Pt is extremely reluctant to leave the hospital due to fear of having to return again like last time.  Assessment & Plan: Principal Problem:   Fecal impaction [facility]) Active Problems:   Atrial fibrillation (HCC)   BPH with obstruction/lower urinary tract symptoms   Rectal pain  Fecal Impaction: Treated with disimpaction under anesthesia by Dr. Lucia Gaskins 12/24.  - Appreciate ongoing surgical recommendations regarding diet: Pt wants to go slow, feels he was pushed too quickly following previous surgery.  - Has not had BM yet - Soft diet today - No narcotics  Atrial fibrillation w/ RVR: Exacerbated by pain, has resolved. 2D ECHO 11/13/16 poor quality. Digoxin level low at 0.2 (target technically just < 2 for AFib), unsure if it's been administered long enough for true level.  - Rate controlled, continue: metoprolol 75mg  BID and digoxin 122mcg daily.  - Cardiology was consulted but has not needed to see the patient. - Continue ASA.  - Consider digoxin level recheck as outpatient.  History of diabetes: Seems to have resolved with weight loss. HbA1c checked 12/23: 4.9%.   - Monitor glucose on labwork  Adenocarcinoma (T1, N0; grade 1) of right colon: s/p  hemicolectomy 12/11 by Dr. Zella Richer.  - Follow up as outpatient  Anemia: Mild, macrocytic 11.6 > 9.7 on admission with concomitant decrease in all cell lines consistent with hemodilution. - Monitor CBC in AM - Vitamin B12 low-normal at 211, folate 10.8. Will start B12 supplement.   BPH:  - Continue flomax - Monitor for post op urinary retention  Depression:  - Restarted wellbutrin  Homelessness: Currently at SNF at [place] consulted  DVT prophylaxis: Heparin Code Status: Full Family Communication: None at bedside, declined offer to call.  Disposition Plan: Continue post-operative care, discharge back to SNF in 24 hours.  Consultants:   General surgery, Dr. Lucia Gaskins  Procedures:   Manual disimpaction under anesthesia 12/24  Antimicrobials:  None   Subjective: Rectal pain improving, has some smears of fecal incontinence but no BM. Was up in chair, but not ambulating. No abd pain, no N/V. Still very hesitant to advance diet.  Objective: Vitals:   12/06/16 1053 12/06/16 1452 12/06/16 2258 12/07/16 0347  BP: 115/72 111/66 114/70 114/74  Pulse:  78 (!) 124 62  Resp:  18 18 18   Temp:  99.3 F (37.4 C) 98.2 F (36.8 C) 98.2 F (36.8 C)  TempSrc:  Oral Oral Oral  SpO2:  99% 98% 100%  Weight:      Height:        Intake/Output Summary (Last 24 hours) at 12/07/16 1654 Last data filed at 12/07/16 0748  Gross per 24 hour  Intake                0 ml  Output  1903 ml  Net            -1903 ml   Filed Weights   12/04/16 1234  Weight: 95.3 kg (210 lb 1.6 oz)    Examination: General exam: Chronically ill-appearing 65 y.o. male in no distress Respiratory system: Non-labored breathing room air. Clear to auscultation bilaterally.  Cardiovascular system: Regular rate and rhythm. No murmur, rub, or gallop. No JVD, and no pedal edema. Gastrointestinal system: Abdomen soft, non-tender, non-distended, with normoactive bowel sounds. Midline lower incision  and laparoscopic incision sites c/d/i with steristrips in midline incision. No organomegaly or masses felt. Central nervous system: Alert and oriented. No focal neurological deficits. Extremities: Warm, no deformities Skin: No rashes, lesions ulcers Psychiatry: Judgement and insight appear normal. Mood & affect appropriate.   Data Reviewed: I have personally reviewed following labs and imaging studies  CBC:  Recent Labs Lab 12/04/16 0040 12/05/16 0510 12/06/16 0544  WBC 8.1 4.2 5.0  NEUTROABS 6.0  --   --   HGB 11.6* 9.7* 10.5*  HCT 34.6* 28.8* 31.2*  MCV 100.6* 103.2* 104.0*  PLT 334 255 [id number]   Basic Metabolic Panel:  Recent Labs Lab 12/04/16 0040 12/05/16 0510 12/06/16 0544  NA 134* 138 139  K 3.9 3.8 4.0  CL 100* 107 108  CO2 26 24 24   GLUCOSE 119* 85 80  BUN 29* 18 9  CREATININE 0.71 0.49* 0.51*  CALCIUM 8.4* 7.8* 8.1*   GFR: Estimated Creatinine Clearance: 110 mL/min (by C-G formula based on SCr of 0.51 mg/dL (L)). Liver Function Tests:  Recent Labs Lab 12/04/16 0040 12/05/16 0510  AST 27 18  ALT 27 22  ALKPHOS 79 63  BILITOT 1.0 0.8  PROT 6.5 5.2*  ALBUMIN 3.4* 2.8*    Recent Labs Lab 12/04/16 0040  LIPASE 18   No results for input(s): AMMONIA in the last 168 hours. Coagulation Profile: No results for input(s): INR, PROTIME in the last 168 hours. Cardiac Enzymes: No results for input(s): CKTOTAL, CKMB, CKMBINDEX, TROPONINI in the last 168 hours. BNP (last 3 results) No results for input(s): PROBNP in the last 8760 hours. HbA1C: No results for input(s): HGBA1C in the last 72 hours. CBG:  Recent Labs Lab 12/06/16 1145 12/06/16 1714 12/06/16 2259 12/07/16 0816 12/07/16 1200  GLUCAP 94 143* 128* 109* 152*   Lipid Profile: No results for input(s): CHOL, HDL, LDLCALC, TRIG, CHOLHDL, LDLDIRECT in the last 72 hours. Thyroid Function Tests: No results for input(s): TSH, T4TOTAL, FREET4, T3FREE, THYROIDAB in the last 72 hours. Anemia  Panel:  Recent Labs  12/06/16 0544 12/06/16 0545  VITAMINB12 211  --   FOLATE  --  10.8   Urine analysis:    Component Value Date/Time   COLORURINE YELLOW 12/04/2016 0624   APPEARANCEUR CLEAR 12/04/2016 0624   LABSPEC 1.033 (H) 12/04/2016 0624   PHURINE 5.0 12/04/2016 0624   GLUCOSEU 50 (A) 12/04/2016 0624   HGBUR NEGATIVE 12/04/2016 0624   BILIRUBINUR NEGATIVE 12/04/2016 0624   BILIRUBINUR NEG 05/27/2013 1516   KETONESUR NEGATIVE 12/04/2016 0624   PROTEINUR NEGATIVE 12/04/2016 0624   UROBILINOGEN 0.2 05/27/2013 1516   NITRITE NEGATIVE 12/04/2016 0624   LEUKOCYTESUR NEGATIVE 12/04/2016 0624   Sepsis Labs: @LABRCNTIP (procalcitonin:4,lacticidven:4)  )No results found for this or any previous visit (from the past 240 hour(s)).   Radiology Studies: No results found.  Scheduled Meds: . aspirin EC  81 mg Oral Daily  . busPIRone  7.5 mg Oral TID  . digoxin  0.125 mg Oral  Daily  . docusate sodium  100 mg Oral Daily  . heparin  5,000 Units Subcutaneous Q8H  . insulin aspart  0-5 Units Subcutaneous QHS  . insulin aspart  0-9 Units Subcutaneous TID WC  . metoprolol  5 mg Intravenous Once  . metoprolol tartrate  75 mg Oral BID  . multivitamin with minerals  1 tablet Oral Daily  . tamsulosin  0.4 mg Oral Daily  . vitamin B-12  1,000 mcg Oral Daily   Continuous Infusions:    LOS: 3 days   Time spent: 25 minutes.  Vance Gather, MD [facility] Pager [phone]  If 7PM-7AM, please contact night-coverage [email or web address] Password [facility] 12/07/2016, 4:54 PM

## 2016-12-07 NOTE — Progress Notes (Signed)
Patient ID: [name], male   DOB: 06-02-1951, 65 y.o.   MRN: [id number]  [facility] Surgery Progress Note  2 Days Post-Op  Subjective: Passing flatus. Has not yet had a bowel movement. Tolerating small amounts of regular diet. Got OOB yesterday but has not done a lot today. Worried about being discharged, states that he wants to stay just one more day.  Objective: Vital signs in last 24 hours: Temp:  [98.2 F (36.8 C)-99.3 F (37.4 C)] 98.2 F (36.8 C) (12/26 0347) Pulse Rate:  [62-124] 62 (12/26 0347) Resp:  [18] 18 (12/26 0347) BP: (111-114)/(66-74) 114/74 (12/26 0347) SpO2:  [98 %-100 %] 100 % (12/26 0347) Last BM Date: 12/06/16  Intake/Output from previous day: 12/25 0701 - 12/26 0700 In: 720 [P.O.:720] Out: 2378 [Urine:2375; Stool:3] Intake/Output this shift: Total I/O In: -  Out: 400 [Urine:400]  PE: Gen:  Alert, NAD, pleasant Pulm:  Effort normal Abd: Soft, NT/ND, +BS, no HSM, well healed midline incision C/D/I with steri strips in place  Lab Results:   Recent Labs  12/05/16 0510 12/06/16 0544  WBC 4.2 5.0  HGB 9.7* 10.5*  HCT 28.8* 31.2*  PLT 255 255   BMET  Recent Labs  12/05/16 0510 12/06/16 0544  NA 138 139  K 3.8 4.0  CL 107 108  CO2 24 24  GLUCOSE 85 80  BUN 18 9  CREATININE 0.49* 0.51*  CALCIUM 7.8* 8.1*   PT/INR No results for input(s): LABPROT, INR in the last 72 hours. CMP     Component Value Date/Time   NA 139 12/06/2016 0544   K 4.0 12/06/2016 0544   CL 108 12/06/2016 0544   CO2 24 12/06/2016 0544   GLUCOSE 80 12/06/2016 0544   BUN 9 12/06/2016 0544   CREATININE 0.51 (L) 12/06/2016 0544   CREATININE 1.10 08/02/2014 1505   CALCIUM 8.1 (L) 12/06/2016 0544   PROT 5.2 (L) 12/05/2016 0510   ALBUMIN 2.8 (L) 12/05/2016 0510   AST 18 12/05/2016 0510   ALT 22 12/05/2016 0510   ALKPHOS 63 12/05/2016 0510   BILITOT 0.8 12/05/2016 0510   GFRNONAA >60 12/06/2016 0544   GFRNONAA 71 08/02/2014 1505   GFRAA >60 12/06/2016  0544   GFRAA 82 08/02/2014 1505   Lipase     Component Value Date/Time   LIPASE 18 12/04/2016 0040       Studies/Results: No results found.  Anti-infectives: Anti-infectives    None       Assessment/Plan Adenocarcinoma of right colon, Grade 1, 0/15 nodes (T1, N0)  S/p laparoscopic hand assisted lysis of adhesions (90 minutes) and right colectomy12/11 Dr. Zella Richer  S/p RECTAL EXAM UNDER ANESTHESIA, DISIMPACTION, rigid sigmoidoscopy 12/24 Dr. Lucia Gaskins  A fib - on metoprolol and digoxin Glucose intolerance BPH Severe protein calorie malnutrition - prealbumin 12.5 (11/25/16) Depression Homeless. Currently at SNF at [place]  VTE - Heparin FEN - regular diet  Plan - Patient is passing flatus. Ready for discharge back to SNF from our standpoint. Continue regular diet. Continue to encourage mobility.   Will follow-up with Dr. Zella Richer outpatient as scheduled.   LOS: 3 days    Jerrye Beavers , [facility] Surgery 12/07/2016, 11:07 AM Pager: [phone] Consults: [phone] Mon-Fri 7:00 am-4:30 pm Sat-Sun 7:00 am-11:30 am  Agree with above.  Alphonsa Overall, MD, [facility] Surgery Pager: [phone] Office phone:  [phone]

## 2016-12-07 NOTE — NC FL2 (Signed)
[place] LEVEL OF CARE SCREENING TOOL     IDENTIFICATION  Patient Name: [name] Birthdate: 01-Mar-1951 Sex: male Admission Date (Current Location): 12/03/2016  [facility] and [place] Number:  Herbalist and Address:  [facility],  [place] [address] [place]      Provider Number: [id number]  Attending Physician Name and Address:  Patrecia Pour, MD  Relative Name and Phone Number:       Current Level of Care: Hospital Recommended Level of Care: [place] Prior Approval Number:    Date Approved/Denied:   PASRR Number: [id number]  Discharge Plan: SNF    Current Diagnoses: Patient Active Problem List   Diagnosis Date Noted  . Rectal pain 12/04/2016  . 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 Polyp of cecum 11/17/2016  . Abnormal CT scan, colon   . Benign neoplasm of cecum   . AKI (acute kidney injury) ([place])   . Generalized weakness   . Fecal impaction ([place])   . Abdominal pain   . Impaction of colon ([place])   . Protein-calorie malnutrition, severe 11/10/2016  . Constipation 11/09/2016  . Urinary tract infection without hematuria 11/09/2016  . Hypokalemia 11/09/2016  . CKD (chronic kidney disease) stage 3, GFR 30-59 ml/min 11/09/2016  . Hyperbilirubinemia 11/09/2016  . Homelessness 11/09/2016  . Hyponatremia 11/08/2016  . RBBB 09/13/2013  . Snoring 09/13/2013  . Edema 09/07/2013  . Diabetes mellitus type 2 in obese ([place]) 03/16/2013  . Arthritis 03/16/2013  . Essential hypertension, benign 02/23/2013  . Morbid obesity ([place]) 02/23/2013  . Atrial fibrillation ([place]) 02/23/2013  . Other and unspecified hyperlipidemia 02/23/2013    Orientation RESPIRATION BLADDER Height & Weight     Self, Time, Situation, Place  Normal Incontinent Weight: 210 lb 1.6 oz (95.3 kg) Height:  6\' 3"  (190.5 cm)  BEHAVIORAL SYMPTOMS/MOOD NEUROLOGICAL BOWEL NUTRITION STATUS      Incontinent Diet (Soft)   AMBULATORY STATUS COMMUNICATION OF NEEDS Skin   Extensive Assist Verbally  (Wound / Incision (Open or Dehisced) 12/04/16 Other (Comment) Sacrum Right;Left)                       Personal Care Assistance Level of Assistance  Bathing, Dressing Bathing Assistance: Limited assistance   Dressing Assistance: Limited assistance     Functional Limitations Info             [place]  PT (By licensed PT), OT (By licensed OT)     PT Frequency: 5 OT Frequency: 5            Contractures      Additional Factors Info  Code Status, Allergies Code Status Info: Fullcode             Current Medications (12/07/2016):  This is the current hospital active medication list Current Facility-Administered Medications  Medication Dose Route Frequency Provider Last Rate Last Dose  . acetaminophen (TYLENOL) tablet 650 mg  650 mg Oral Q6H PRN Deneise Lever, MD   650 mg at 12/05/16 0241  . aspirin EC tablet 81 mg  81 mg Oral Daily Patrecia Pour, MD   81 mg at 12/07/16 0956  . busPIRone (BUSPAR) tablet 7.5 mg  7.5 mg Oral TID Patrecia Pour, MD   7.5 mg at 12/07/16 0955  . digoxin (LANOXIN) tablet 0.125 mg  0.125 mg Oral Daily Alphonsa Overall, MD   0.125 mg at 12/07/16 0955  .  docusate sodium (COLACE) capsule 100 mg  100 mg Oral Daily Alphonsa Overall, MD   100 mg at 12/07/16 0955  . heparin injection 5,000 Units  5,000 Units Subcutaneous Q8H Deneise Lever, MD   5,000 Units at 12/06/16 2259  . insulin aspart (novoLOG) injection 0-5 Units  0-5 Units Subcutaneous QHS Deneise Lever, MD      . insulin aspart (novoLOG) injection 0-9 Units  0-9 Units Subcutaneous TID WC Deneise Lever, MD   1 Units at 12/06/16 1733  . metoprolol (LOPRESSOR) injection 2.5 mg  2.5 mg Intravenous Q6H PRN Deneise Lever, MD      . metoprolol (LOPRESSOR) injection 5 mg  5 mg Intravenous Once Deneise Lever, MD      . metoprolol tartrate (LOPRESSOR) tablet 75 mg  75 mg Oral BID Patrecia Pour, MD   75  mg at 12/07/16 0955  . multivitamin with minerals tablet 1 tablet  1 tablet Oral Daily Alphonsa Overall, MD   1 tablet at 12/07/16 0955  . ondansetron (ZOFRAN) tablet 4 mg  4 mg Oral Q6H PRN Deneise Lever, MD       Or  . ondansetron [facility]) injection 4 mg  4 mg Intravenous Q6H PRN Deneise Lever, MD      . senna [facility]) tablet 8.6 mg  1 tablet Oral BID PRN Patrecia Pour, MD      . sodium chloride flush (NS) 0.9 % injection 3 mL  3 mL Intravenous PRN Alphonsa Overall, MD      . tamsulosin [facility]) capsule 0.4 mg  0.4 mg Oral Daily Alphonsa Overall, MD   0.4 mg at 12/07/16 0956  . traMADol (ULTRAM) tablet 50 mg  50 mg Oral Q6H PRN Patrecia Pour, MD      . vitamin B-12 (CYANOCOBALAMIN) tablet 1,000 mcg  1,000 mcg Oral Daily Patrecia Pour, MD   1,000 mcg at 12/07/16 [id number]     Discharge Medications: Please see discharge summary for a list of discharge medications.  Relevant Imaging Results:  Relevant Lab Results:   Additional Information SSN: [id number]  Standley Brooking, LCSW

## 2016-12-08 DIAGNOSIS — R109 Unspecified abdominal pain: Secondary | ICD-10-CM | POA: Diagnosis not present

## 2016-12-08 DIAGNOSIS — C189 Malignant neoplasm of colon, unspecified: Secondary | ICD-10-CM

## 2016-12-08 DIAGNOSIS — I482 Chronic atrial fibrillation: Secondary | ICD-10-CM | POA: Diagnosis not present

## 2016-12-08 DIAGNOSIS — N401 Enlarged prostate with lower urinary tract symptoms: Secondary | ICD-10-CM

## 2016-12-08 DIAGNOSIS — K6289 Other specified diseases of anus and rectum: Secondary | ICD-10-CM | POA: Diagnosis not present

## 2016-12-08 DIAGNOSIS — M6281 Muscle weakness (generalized): Secondary | ICD-10-CM | POA: Diagnosis not present

## 2016-12-08 DIAGNOSIS — N390 Urinary tract infection, site not specified: Secondary | ICD-10-CM | POA: Diagnosis not present

## 2016-12-08 DIAGNOSIS — K5900 Constipation, unspecified: Secondary | ICD-10-CM | POA: Diagnosis not present

## 2016-12-08 DIAGNOSIS — E876 Hypokalemia: Secondary | ICD-10-CM | POA: Diagnosis not present

## 2016-12-08 DIAGNOSIS — I4891 Unspecified atrial fibrillation: Secondary | ICD-10-CM | POA: Diagnosis not present

## 2016-12-08 DIAGNOSIS — E871 Hypo-osmolality and hyponatremia: Secondary | ICD-10-CM | POA: Diagnosis not present

## 2016-12-08 DIAGNOSIS — I10 Essential (primary) hypertension: Secondary | ICD-10-CM | POA: Diagnosis not present

## 2016-12-08 DIAGNOSIS — K5641 Fecal impaction: Secondary | ICD-10-CM | POA: Diagnosis not present

## 2016-12-08 DIAGNOSIS — K5649 Other impaction of intestine: Secondary | ICD-10-CM | POA: Diagnosis not present

## 2016-12-08 DIAGNOSIS — R100 Acute abdomen: Secondary | ICD-10-CM | POA: Diagnosis not present

## 2016-12-08 DIAGNOSIS — N183 Chronic kidney disease, stage 3 (moderate): Secondary | ICD-10-CM | POA: Diagnosis not present

## 2016-12-08 DIAGNOSIS — F411 Generalized anxiety disorder: Secondary | ICD-10-CM | POA: Diagnosis not present

## 2016-12-08 DIAGNOSIS — E43 Unspecified severe protein-calorie malnutrition: Secondary | ICD-10-CM | POA: Diagnosis not present

## 2016-12-08 DIAGNOSIS — N138 Other obstructive and reflux uropathy: Secondary | ICD-10-CM

## 2016-12-08 LAB — GLUCOSE, CAPILLARY
Glucose-Capillary: 107 mg/dL — ABNORMAL HIGH (ref 65–99)
Glucose-Capillary: 133 mg/dL — ABNORMAL HIGH (ref 65–99)

## 2016-12-08 MED ORDER — POLYETHYLENE GLYCOL 3350 17 G PO PACK: 17.0000 g | PACK | Freq: Every day | ORAL | Status: DC

## 2016-12-08 MED ORDER — ACETAMINOPHEN 325 MG PO TABS: 650.0000 mg | ORAL_TABLET | Freq: Four times a day (QID) | ORAL | Status: DC | PRN

## 2016-12-08 MED ORDER — DOCUSATE SODIUM 100 MG PO CAPS: 100.0000 mg | ORAL_CAPSULE | Freq: Two times a day (BID) | ORAL | Status: AC

## 2016-12-08 NOTE — Progress Notes (Signed)
Physical Therapy Treatment Patient Details Name: [name] MRN: [id number] DOB: June 16, 1951 Today's Date: 12/08/2016    History of Present Illness Pt admitted from SNF level rehab and underwent bowel disimpaction under anesthesia.  Pt with hx of DM, A-fib and recent colon resection    PT Comments    Pt progressing with mobility, he ambulated 300' with RW without loss of balance. Distance limited by onset of urge to have BM. Pt stated he is "distraught" about leaving the hospital too soon, feels he needs to stay a few more days to get "bowels under control".     Follow Up Recommendations  SNF;Supervision for mobility/OOB     Equipment Recommendations  None recommended by PT    Recommendations for Other Services       Precautions / Restrictions Precautions Precautions: Fall Precaution Comments: recent ABD surgery Restrictions Weight Bearing Restrictions: No    Mobility  Bed Mobility Overal bed mobility: Needs Assistance Bed Mobility: Supine to Sit     Supine to sit: Min assist     General bed mobility comments: min assist to bring trunk to upright  Transfers Overall transfer level: Needs assistance Equipment used: Rolling walker (2 wheeled) Transfers: Sit to/from Stand Sit to Stand: From elevated surface;Min guard         General transfer comment: min guard from elevated bed  Ambulation/Gait Ambulation/Gait assistance: Min guard Ambulation Distance (Feet): 300 Feet Assistive device: Rolling walker (2 wheeled) Gait Pattern/deviations: Step-through pattern;Decreased step length - right;Decreased step length - left;Shuffle;Trunk flexed     General Gait Details: cues for posture and positon from RW, distance limited by urge to have BM so assisted pt to bathroom   Stairs            Wheelchair Mobility    Modified Rankin (Stroke Patients Only)       Balance Overall balance assessment: Modified Independent                                   Cognition Arousal/Alertness: Awake/alert Behavior During Therapy: Anxious (doesn't want to leave hospital) Overall Cognitive Status: Within Functional Limits for tasks assessed                      Exercises      General Comments        Pertinent Vitals/Pain Pain Assessment: No/denies pain    Home Living                      Prior Function            PT Goals (current goals can now be found in the care plan section) Acute Rehab PT Goals Patient Stated Goal: "get my bowels under control" PT Goal Formulation: With patient Time For Goal Achievement: 11/29/16 Potential to Achieve Goals: Good Progress towards PT goals: Progressing toward goals    Frequency    Min 3X/week      PT Plan Current plan remains appropriate    Co-evaluation             End of Session Equipment Utilized During Treatment: Gait belt Activity Tolerance: Patient tolerated treatment well Patient left: with call bell/phone within reach;Other (comment) (on commode, RN aware)     Time: 0950-1005 PT Time Calculation (min) (ACUTE ONLY): 15 min  Charges:  $Gait Training: 8-22 mins  G Codes:      Blondell Reveal Kistler 12/08/2016, 10:12 AM [phone]

## 2016-12-08 NOTE — Progress Notes (Signed)
Patient is set to discharge back to [facility] SNF today. [employer] insurance authorization obtained. Patient made aware. Discharge packet given to RN, Anderson Malta. [facility] called for transport.     Raynaldo Opitz, [facility] Clinical Social Worker cell #: [phone]

## 2016-12-08 NOTE — Discharge Summary (Signed)
Physician Discharge Summary  [name] [id number] DOB: 12-11-1951 DOA: 12/03/2016  PCP: Sandi Mariscal, MD  Admit date: 12/03/2016 Discharge date: 12/08/2016  Time spent: 35 minutes  Recommendations for Outpatient Follow-up:  Please assure patient will maintain good hydration  Medications as prescribed Rehabilitation and assistance as per SNF protocol  Repeat BMET in 5 days to follow electrolytes and renal function Recheck digoxin level in 1 week  Discharge Diagnoses:  Principal Problem:   Fecal impaction ([place]) Active Problems:   Atrial fibrillation ([place])   BPH with obstruction/lower urinary tract symptoms   Rectal pain   Adenocarcinoma of colon [facility]) Anxiety   Discharge Condition: stable and improved; patient discharge to SNF for further care and rehabilitation. 2 good BM's prior to discharge.  Diet recommendation: heart healthy and modified carbohydrates. Soft diet to start and subsequently move into regular consistency   Filed Weights   12/04/16 1234  Weight: 95.3 kg (210 lb 1.6 oz)    History of present illness:  As per H&P written by Dr. Ernestina Patches on 12/03/16 65 y.o. male with medical history significant of Adenocarcinoma of R colon (S/P R hemicolectomy 11/22/2016), chronic Atrial fibrillation, HTN, DM type (diet controlled) and recent admission due to fecal impaction/constipation; who presented with worsening rectal pain over multiple days. Initially evaluated by general surgery via Dr. Lucia Gaskins on day of admission and Initial plan was for fecal disimpaction in OR. However, pt noted to be in afib w/ RVR w/ HR in 120s. Was admitted by IM for further stabilization and future plans to go to OR for disimpaction.   Hospital Course:  Fecal Impaction: Treated with disimpaction under anesthesia by Dr. Lucia Gaskins 12/24.  - Appreciate ongoing surgical recommendations regarding diet: soft diet and subsequent advancement to regular consistency -tolerating soft diet -will discharge on  colace and miralax  -No narcotics  Atrial fibrillation w/ RVR: Exacerbated by pain, has resolved. Recent 2D ECHO 11/13/16 poor quality study.  - Rate controlled, continue: metoprolol 75mg  BID and digoxin 170mcg daily.  - Continue ASA.  - Consider digoxin level recheck as outpatient in 1 week.  History of diabetes:  -Last HbA1c checked 12/23: 4.9%.   -advise to follow low carb diet   Adenocarcinoma (T1, N0; grade 1) of right colon: s/p hemicolectomy 12/11 by Dr. Zella Richer.  -Follow up as outpatient -patient healing properly   Anemia: Mild, macrocytic 11.6 > 9.7 on admission with concomitant decrease in all cell lines consistent with hemodilution. - Monitor CBC at follow up to assess Hgb trend   BPH:  - Continue flomax - no signs of urinary retention appreciated   Depression:  - Resume Wellbutrin  Homelessness: Currently at SNF at [place] consulted to assist with discharge  *rest of medical problems stable. Will continue home medication regimen and encourage outpatient follow up with PCP.  Procedures:  Rigid colonoscopy with Manual disimpaction under anesthesia 12/24  Consultations:  General surgery   Discharge Exam: Vitals:   12/08/16 0642 12/08/16 0936  BP: 120/74 120/72  Pulse: 75 74  Resp: 18   Temp: 98.9 F (37.2 C)    General exam: Chronically ill-appearing 65 y.o. male in no distress; denies CP and SOB. Had 2BM's overnight Respiratory system: Non-labored breathing room air. Clear to auscultation bilaterally.  Cardiovascular system: Regular rate and rhythm. No murmur, rub, or gallop. No JVD, and no pedal edema. Gastrointestinal system: Abdomen soft, non-tender, non-distended, with normoactive bowel sounds. Midline lower incision and laparoscopic incision sites c/d/i with steristrips in midline incision.  No organomegaly or masses felt. Central nervous system: Alert and oriented. No focal neurological deficits. Extremities: Warm, no  deformities Skin: No rashes, lesions ulcers Psychiatry: Judgement and insight appear normal.   Discharge Instructions   Discharge Instructions    Diet - low sodium heart healthy    Complete by:  As directed    Discharge instructions    Complete by:  As directed    Please assure patient will maintain good hydration  Medications as prescribed Rehabilitation and assistance as per SNF protocol  Repeat BMET in 5 days to follow electrolytes and renal function     Current Discharge Medication List    START taking these medications   Details  acetaminophen (TYLENOL) 325 MG tablet Take 2 tablets (650 mg total) by mouth every 6 (six) hours as needed (for pain and/or Fever >/= 101).    docusate sodium (COLACE) 100 MG capsule Take 1 capsule (100 mg total) by mouth 2 (two) times daily.      CONTINUE these medications which have CHANGED   Details  polyethylene glycol (MIRALAX) packet Take 17 g by mouth daily. Hold for diarrhea      CONTINUE these medications which have NOT CHANGED   Details  aspirin EC 81 MG EC tablet Take 1 tablet (81 mg total) by mouth daily.    busPIRone (BUSPAR) 7.5 MG tablet Take 1 tablet (7.5 mg total) by mouth 3 (three) times daily.    digoxin (LANOXIN) 0.125 MG tablet Take 1 tablet (0.125 mg total) by mouth daily.    feeding supplement, ENSURE ENLIVE, (ENSURE ENLIVE) LIQD Take 237 mLs by mouth 4 (four) times daily.    hydrocortisone (ANUSOL-HC) 2.5 % rectal cream Place rectally every 12 (twelve) hours as needed for hemorrhoids or itching. Qty: 30 g, Refills: 0    liver oil-zinc oxide (DESITIN) 40 % ointment Apply topically as needed for irritation.    Metoprolol Tartrate 75 MG TABS Take 75 mg by mouth 2 (two) times daily.    Multiple Vitamin (MULTIVITAMIN) capsule Take 1 capsule by mouth daily.    ranitidine (ZANTAC) 150 MG capsule Take 150 mg by mouth daily as needed for heartburn.     tamsulosin (FLOMAX) 0.4 MG CAPS capsule Take 1 capsule (0.4 mg  total) by mouth daily.      STOP taking these medications     methocarbamol (ROBAXIN) 500 MG tablet      oxyCODONE (OXY IR/ROXICODONE) 5 MG immediate release tablet        Allergies  Allergen Reactions  . Other     [name] bandage (self-gripping) - erythema, itching   Contact information for after-discharge care    Destination    [facility] Follow up.   Specialty:  [place] information: [address] [place] [phone]              The results of significant diagnostics from this hospitalization (including imaging, microbiology, ancillary and laboratory) are listed below for reference.    Significant Diagnostic Studies: Dg Abdomen 1 View  Result Date: 11/08/2016 CLINICAL DATA:  Constipation over the last month. EXAM: ABDOMEN - 1 VIEW COMPARISON:  None. FINDINGS: Gas pattern does not suggest ileus or obstruction. Amount of fecal matter in the colon is within normal limits with the exception of slightly prominent stool in the rectal vault. No significant soft tissue calcifications. Some vascular calcification noted. Chronic degenerative changes affect the spine. IMPRESSION: No markedly  abnormal findings. Overall amount of fecal matter appears within normal limits. Slightly prominent stool in the rectal vault. Electronically Signed   By: Nelson Chimes M.D.   On: 11/08/2016 14:29   Ct Head Wo Contrast  Result Date: 11/08/2016 CLINICAL DATA:  Generalized weakness. Poor historian. Difficulty raising right arm. EXAM: CT HEAD WITHOUT CONTRAST TECHNIQUE: Contiguous axial images were obtained from the base of the skull through the vertex without intravenous contrast. COMPARISON:  None. FINDINGS: Brain: Ventricles and cisterns are within normal. There is minimal prominence of the CSF spaces compatible with age related atrophy. There is no mass, mass effect, shift of midline structures or acute  hemorrhage. No evidence of acute infarction. Vascular: Minimal calcified plaque over the cavernous segment of the internal carotid arteries. Skull: Within normal. Sinuses/Orbits: Within normal. IMPRESSION: No acute intracranial findings. Electronically Signed   By: Marin Olp M.D.   On: 11/08/2016 15:56   Ct Abdomen Pelvis W Contrast  Result Date: 12/04/2016 CLINICAL DATA:  Abdominal pain EXAM: CT ABDOMEN AND PELVIS WITH CONTRAST TECHNIQUE: Multidetector CT imaging of the abdomen and pelvis was performed using the standard protocol following bolus administration of intravenous contrast. CONTRAST:  188mL ISOVUE-300 IOPAMIDOL (ISOVUE-300) INJECTION 61% COMPARISON:  CT abdomen pelvis 11/08/2016 FINDINGS: Lower chest: There is free air within the anterior pneumomediastinum. Small bilateral pleural effusions. Hepatobiliary: Normal hepatic size and contours without focal liver lesion. No perihepatic ascites. No intra- or extrahepatic biliary dilatation. Normal gallbladder. Pancreas: Normal pancreatic contours and enhancement. No peripancreatic fluid collection or pancreatic ductal dilatation. Spleen: Multiple hypoattenuating foci within the spleen. These are unchanged from the prior study. Adrenals/Urinary Tract: Unchanged right adrenal nodule measuring 1.6 cm. Normal left adrenal gland. Normal right kidney. Multiple left renal sinus cysts. Stomach/Bowel: There is a large stool ball within the rectum. No evidence of stercoral inflammation. Moderate amount of stool within the descending colon. There is an anastomosis within the right lower quadrant. Within the dependent portion of the anastomotic segment, there is a focal area of hyperattenuation suggesting hemorrhage (series 2, image 46 and series 5, image 78). There is surrounding inflammatory stranding within the fat. There is a small amount of pneumoperitoneum in the right upper quadrant. Vascular/Lymphatic: There is atherosclerotic calcification of the non  aneurysmal abdominal aorta. No abdominal or pelvic adenopathy. Reproductive: Normal prostate and seminal vesicles. Musculoskeletal: Bilateral sacroiliac bridging osteophytes. Multilevel lumbar facet arthrosis and osteophytosis. Other: Small amount of gas tracking into both inguinal canals. IMPRESSION: 1. Blood products layering within the dependent portion of the right lower quadrant anastomosis, concerning for hemorrhage at the operative site. There is surrounding inflammatory change with a small amount of adjacent free fluid. There is no well-defined collection, but a small anastomotic leak would be difficult to exclude. 2. Small volume pneumoperitoneum and air extending into both inguinal canals in the anterior mediastinum is likely postoperative. 3. Small bilateral pleural effusions. 4. Aortic atherosclerosis. Electronically Signed   By: Ulyses Jarred M.D.   On: 12/04/2016 05:21   Ct Abdomen Pelvis W Contrast  Addendum Date: 11/15/2016   ADDENDUM REPORT: 11/15/2016 08:56 ADDENDUM: Case reviewed prior to barium enema. There is sessile and lobulated mural filling defect in the cecum on the right lateral wall measuring 3 cm in diameter by 12 mm in thickness. While adherent stool can give this appearance, finding is more concerning for a mass. Case discussed with Dr. Fuller Plan via telephone at time of addendum. Patient is under consideration for colonoscopy. Electronically Signed   By: Angelica Chessman  Watts M.D.   On: 11/15/2016 08:56   Result Date: 11/15/2016 CLINICAL DATA:  Initial evaluation for weakness, weight loss, constipation. EXAM: CT ABDOMEN AND PELVIS WITH CONTRAST TECHNIQUE: Multidetector CT imaging of the abdomen and pelvis was performed using the standard protocol following bolus administration of intravenous contrast. CONTRAST:  119mL ISOVUE-300 IOPAMIDOL (ISOVUE-300) INJECTION 61% COMPARISON:  Prior radiograph from earlier the same day. FINDINGS: Lower chest: Mild scattered atelectatic changes present  within the visualized lung bases. Visualized lungs are otherwise clear. No pleural or pericardial fusion. Hepatobiliary: Liver demonstrates a normal contrast enhanced appearance. Gallbladder within normal limits. No biliary dilatation. Pancreas: Pancreas within normal limits. Spleen: Scattered hypodense lesions noted within the spleen, largest of which measures 9 mm. These are indeterminate, but are of doubtful clinical significance. Spleen otherwise unremarkable. Adrenals/Urinary Tract: 2.3 cm hypodense nodule noted within the right adrenal gland, most likely a small adenoma. Left adrenal gland within normal limits. Kidneys equal in size with symmetric enhancement. Scattered parapelvic cyst noted within the left kidney. Few scattered calcifications within the left renal hilum suspicious for small nonobstructive calculi. These measure up to 3 mm. No hydronephrosis. No focal enhancing renal mass. Scattered cortical thinning/scarring noted. Ureters are of normal caliber without acute abnormality. Bladder within normal limits. Stomach/Bowel: Small hiatal hernia noted. Stomach otherwise unremarkable. Small bowel within normal limits for caliber without evidence for obstruction. Colonic diverticulosis without evidence for acute diverticulitis. Appendix not visualize, compatible with history of prior appendectomy. No abnormal wall thickening, mucosal enhancement, or inflammatory fat stranding seen about the bowels. Large amount of retained stool within the rectal vault, suggesting constipation. Vascular/Lymphatic: Normal intravascular enhancement seen throughout the intra-abdominal aorta and its branch vessels. Moderate aorto bi-iliac atherosclerotic disease. No aneurysm. No pathologically enlarged intra-abdominal or pelvic lymph nodes. Reproductive: Prostate within normal limits. Other: No free air or fluid. Small bilateral fat containing inguinal hernias noted. Musculoskeletal: No acute osseous abnormality. No worrisome  lytic or blastic osseous lesions. Moderate multilevel degenerative spondylolysis noted within the visualized spine. IMPRESSION: 1. No CT evidence for acute intra-abdominal or pelvic process. 2. Large amount of retained stool within the rectal vault, suggesting constipation. 3. Sigmoid diverticulosis without evidence for acute diverticulitis. 4. Question punctate nonobstructive left renal nephrolithiasis. Electronically Signed: By: Jeannine Boga M.D. On: 11/08/2016 18:08   Dg Abd 2 Views  Result Date: 11/10/2016 CLINICAL DATA:  Acute generalized abdominal pain. EXAM: ABDOMEN - 2 VIEW COMPARISON:  CT scan of November 08, 2016. FINDINGS: The bowel gas pattern is normal. There is no evidence of free air. No radio-opaque calculi or other significant radiographic abnormality is seen. Stool is noted in the rectum. IMPRESSION: No definite evidence of bowel obstruction or ileus. Electronically Signed   By: Marijo Conception, M.D.   On: 11/10/2016 15:53   Dg Colon Therapeutic W/cm  Result Date: 11/12/2016 CLINICAL DATA:  Severe constipation. EXAM: WATER SOLUBLE CONTRAST ENEMA TECHNIQUE: Approximately 3 L of warm water mixed with 480 cc of Gastrografin was instilled per rectum. FLUOROSCOPY TIME:  Radiation Exposure Index (if provided by the fluoroscopic device): 183.47 mGy COMPARISON:  Radiographs dated 11/10/2016 FINDINGS: EKG knee demonstrates moderate stool in the ascending colon as well as in the rectum. The study shows a 6 mm stool ball in the rectum. There is also stool throughout the remainder of the colon particularly in the cecum. There are few diverticula in the sigmoid portion the colon. The study was suboptimal due to patient's inability to move well during the exam. Multiple smaller  stool balls throughout the colon. I was able to get some contrast into the ascending colon. There are no discrete strictures or definitive mass lesions. The patient was unable to sit up to evacuate the contrast at the end  of the exam. IMPRESSION: Extensive stool in the distal colon as well as in the ascending colon. No dilatation of the colon. Scattered diverticula in the sigmoid portion of the colon. No discrete obstruction or mass lesion. However, the study is suboptimal for detail because of the patient's inability to retain the contrast or move well during the exam. Electronically Signed   By: Lorriane Shire M.D.   On: 11/12/2016 12:46    Microbiology: No results found for this or any previous visit (from the past 240 hour(s)).   Labs: Basic Metabolic Panel:  Recent Labs Lab 12/04/16 0040 12/05/16 0510 12/06/16 0544  NA 134* 138 139  K 3.9 3.8 4.0  CL 100* 107 108  CO2 26 24 24   GLUCOSE 119* 85 80  BUN 29* 18 9  CREATININE 0.71 0.49* 0.51*  CALCIUM 8.4* 7.8* 8.1*   Liver Function Tests:  Recent Labs Lab 12/04/16 0040 12/05/16 0510  AST 27 18  ALT 27 22  ALKPHOS 79 63  BILITOT 1.0 0.8  PROT 6.5 5.2*  ALBUMIN 3.4* 2.8*    Recent Labs Lab 12/04/16 0040  LIPASE 18   CBC:  Recent Labs Lab 12/04/16 0040 12/05/16 0510 12/06/16 0544  WBC 8.1 4.2 5.0  NEUTROABS 6.0  --   --   HGB 11.6* 9.7* 10.5*  HCT 34.6* 28.8* 31.2*  MCV 100.6* 103.2* 104.0*  PLT 334 255 255   CBG:  Recent Labs Lab 12/07/16 1200 12/07/16 1727 12/07/16 2251 12/08/16 0800 12/08/16 1242  GLUCAP 152* 130* 136* 107* 133*    Signed:  Barton Dubois MD.  [facility] 12/08/2016, 2:07 PM

## 2016-12-08 NOTE — Care Management Important Message (Signed)
Important Message  Patient Details IM Letter given to Sarah/Case Manager to present to Patient Name: [name] MRN: [id number] Date of Birth: [birth year]   Medicare Important Message Given:  Yes    Kerin Salen 12/08/2016, 11:48 AMImportant Message  Patient Details  Name: [name] MRN: [id number] Date of Birth: 1951-08-19   Medicare Important Message Given:  Yes    Kerin Salen 12/08/2016, 11:48 AM

## 2016-12-10 ENCOUNTER — Encounter: Payer: Self-pay | Admitting: Adult Health

## 2016-12-10 ENCOUNTER — Non-Acute Institutional Stay (SKILLED_NURSING_FACILITY): Payer: PPO | Admitting: Adult Health

## 2016-12-10 DIAGNOSIS — N183 Chronic kidney disease, stage 3 unspecified: Secondary | ICD-10-CM

## 2016-12-10 DIAGNOSIS — N401 Enlarged prostate with lower urinary tract symptoms: Secondary | ICD-10-CM

## 2016-12-10 DIAGNOSIS — I10 Essential (primary) hypertension: Secondary | ICD-10-CM | POA: Diagnosis not present

## 2016-12-10 DIAGNOSIS — I482 Chronic atrial fibrillation, unspecified: Secondary | ICD-10-CM

## 2016-12-10 DIAGNOSIS — K5909 Other constipation: Secondary | ICD-10-CM | POA: Diagnosis not present

## 2016-12-10 DIAGNOSIS — K5641 Fecal impaction: Secondary | ICD-10-CM | POA: Diagnosis not present

## 2016-12-10 DIAGNOSIS — N138 Other obstructive and reflux uropathy: Secondary | ICD-10-CM

## 2016-12-10 DIAGNOSIS — C189 Malignant neoplasm of colon, unspecified: Secondary | ICD-10-CM

## 2016-12-10 NOTE — Progress Notes (Signed)
Location:    [facility] Room Number: 101 A Place of Service:  SNF (31)   CODE STATUS: Full Code  Allergies  Allergen Reactions  . Other     [name] bandage (self-gripping) - erythema, itching    Chief Complaint  Patient presents with  . Hospitalization Follow-up    HPI:  He has been hospitalized for a fecal impaction that required a disimpaction under anesthesia. He has rectal cancer and is status post right hemicolectomy on 11-22-16. He tells me that he is feeling much better. There are no nursing concerns at this time. He tells me that his goal is short term rehab.    Past Medical History:  Diagnosis Date  . Atrial fibrillation ([place])   . Cataract   . Diabetes mellitus without complication ([place])   . Hyperlipidemia   . Hypertension   . Lichen planus    bilateral legs    Past Surgical History:  Procedure Laterality Date  . APPENDECTOMY    . COLON RESECTION N/A 11/22/2016   Procedure: HAND ASSISTED LAPAROSCOPIC COLON RESECTION;  Surgeon: [name], MD;  Location: [facility];  Service: General;  Laterality: N/A;  . COLONOSCOPY N/A 11/17/2016   Procedure: COLONOSCOPY;  Surgeon: [name], MD;  Location: [facility];  Service: Endoscopy;  Laterality: N/A;  . FRACTURE SURGERY    . MULTIPLE TOOTH EXTRACTIONS    . RECTAL EXAM UNDER ANESTHESIA N/A 12/05/2016   Procedure: RECTAL EXAM UNDER ANESTHESIA, DISIMPACTION;  Surgeon: Alphonsa Overall, MD;  Location: [facility];  Service: General;  Laterality: N/A;    Social History   Social History  . Marital status: Unknown    Spouse name: N/A  . Number of children: N/A  . Years of education: N/A   Occupational History  . unemployed    Social History Main Topics  . Smoking status: Former Smoker    Types: Pipe    Quit date: 09/14/1983  . Smokeless tobacco: Never Used  . Alcohol use No  . Drug use: No  . Sexual activity: No   Other Topics Concern  . Not on file   Social History Narrative  .  No narrative on file   Family History  Problem Relation Age of Onset  . Brain cancer Mother   . Alzheimer's disease Father   . Cancer Maternal Grandmother   . Heart attack Maternal Grandfather   . Pneumonia Paternal Grandfather       VITAL SIGNS BP 106/68   Pulse 96   Temp 98.7 F (37.1 C)   Resp 18   Ht 6' (1.829 m)   Wt 222 lb (100.7 kg)   SpO2 98%   BMI 30.11 kg/m   Patient's Medications  New Prescriptions   No medications on file  Previous Medications   ACETAMINOPHEN (TYLENOL) 325 MG TABLET    Take 2 tablets (650 mg total) by mouth every 6 (six) hours as needed (for pain and/or Fever >/= 101).   ASPIRIN EC 81 MG EC TABLET    Take 1 tablet (81 mg total) by mouth daily.   BUSPIRONE (BUSPAR) 7.5 MG TABLET    Take 1 tablet (7.5 mg total) by mouth 3 (three) times daily.   DIGOXIN (LANOXIN) 0.125 MG TABLET    Take 1 tablet (0.125 mg total) by mouth daily.   DOCUSATE SODIUM (COLACE) 100 MG CAPSULE    Take 1 capsule (100 mg total) by mouth 2 (two) times daily.   FEEDING SUPPLEMENT, ENSURE ENLIVE, (  ENSURE ENLIVE) LIQD    Take 237 mLs by mouth 4 (four) times daily.   HYDROCORTISONE (ANUSOL-HC) 2.5 % RECTAL CREAM    Place rectally every 12 (twelve) hours as needed for hemorrhoids or itching.   LIVER OIL-ZINC OXIDE (DESITIN) 40 % OINTMENT    Apply topically as needed for irritation.   METOPROLOL TARTRATE 75 MG TABS    Take 75 mg by mouth 2 (two) times daily.   MULTIPLE VITAMIN (MULTIVITAMIN) CAPSULE    Take 1 capsule by mouth daily.   POLYETHYLENE GLYCOL (MIRALAX) PACKET    Take 17 g by mouth daily. Hold for diarrhea   RANITIDINE (ZANTAC) 150 MG CAPSULE    Take 150 mg by mouth daily as needed for heartburn.    TAMSULOSIN (FLOMAX) 0.4 MG CAPS CAPSULE    Take 1 capsule (0.4 mg total) by mouth daily.  Modified Medications   No medications on file  Discontinued Medications   No medications on file     SIGNIFICANT DIAGNOSTIC EXAMS  12-04-16: ct of abdomen and pelvis: 1. Blood  products layering within the dependent portion of the right lower quadrant anastomosis, concerning for hemorrhage at the operative site. There is surrounding inflammatory change with a small amount of adjacent free fluid. There is no well-defined collection, but a small anastomotic leak would be difficult to exclude. 2. Small volume pneumoperitoneum and air extending into both inguinal canals in the anterior mediastinum is likely postoperative. 3. Small bilateral pleural effusions. 4. Aortic atherosclerosis.   LABS REVIEWED:   12-04-16: wbc 8.1; hgb 11.6; hct 34.6; mcv 100.6; plt 334; glucose 119 ;bun 29; creat 0.71; k+ 3.9; na++ 134; liver normal albumin 3.4; dig 0.2; hgb a1c 4.9 12-06-16: wbc 5.0; hgb 10.5; hct 31.2; mcv 104.0; plt 255; glucose 80; bun 9; creat 0.51; k+ 4.0; na++ 139; vit B 12: 211; folate 10.8    Review of Systems  Constitutional: Negative for malaise/fatigue.  Respiratory: Negative for cough and shortness of breath.   Cardiovascular: Negative for chest pain, palpitations and leg swelling.  Gastrointestinal: Negative for abdominal pain, constipation and heartburn.  Musculoskeletal: Negative for back pain, joint pain and myalgias.  Skin: Negative.   Neurological: Negative for dizziness.  Psychiatric/Behavioral: The patient is not nervous/anxious.     Physical Exam  Constitutional: He is oriented to person, place, and time. No distress.  Eyes: Conjunctivae are normal.  Neck: Neck supple. No JVD present. No thyromegaly present.  Cardiovascular: Normal rate, regular rhythm and intact distal pulses.   Respiratory: Effort normal and breath sounds normal. No respiratory distress. He has no wheezes.  GI: Soft. Bowel sounds are normal. He exhibits no distension. There is tenderness.  Musculoskeletal: He exhibits no edema.  Able to move all extremities   Lymphadenopathy:    He has no cervical adenopathy.  Neurological: He is alert and oriented to person, place, and time.    Skin: Skin is warm and dry. He is not diaphoretic.  Incision line without signs of infection present   Psychiatric: He has a normal mood and affect.      ASSESSMENT/ PLAN:  1. Afib: heart rate stable; will continue asa 81 mg daily and will continue digoxin 125 mcg daily and  lopressor 75 mg twice daily for rate control   2. Hypertension; will continue lopressor 75 mg twice daily   3. Adenocarcinoma of colon: status post right hemicolectomy: is followed by oncology  4. BPH: will continue flomax daily   5. CKD stage III: bun  9; creat 0.51;   6. Depression with anxiety: will continue buspar 15 mg three times daily   7. Constipation: has had a fecal impaction: will continue colace twice daily and miralax daily   He will need a bland diet Will check cbc; cmp   Time spent with patient  50  minutes >50% time spent counseling; reviewing medical record; tests; labs; and developing future plan of care   MD is aware of resident's narcotic use and is in agreement with current plan of care. We will attempt to wean resident as apropriate   Ok Edwards NP [facility]  Contact [phone] Monday through Friday 8am- 5pm  After hours call [phone]

## 2016-12-13 DIAGNOSIS — N183 Chronic kidney disease, stage 3 (moderate): Secondary | ICD-10-CM | POA: Diagnosis not present

## 2016-12-13 DIAGNOSIS — I10 Essential (primary) hypertension: Secondary | ICD-10-CM | POA: Diagnosis not present

## 2016-12-13 DIAGNOSIS — I482 Chronic atrial fibrillation: Secondary | ICD-10-CM | POA: Diagnosis not present

## 2016-12-13 DIAGNOSIS — K5900 Constipation, unspecified: Secondary | ICD-10-CM | POA: Diagnosis not present

## 2016-12-13 DIAGNOSIS — Z9049 Acquired absence of other specified parts of digestive tract: Secondary | ICD-10-CM | POA: Diagnosis not present

## 2016-12-13 DIAGNOSIS — E871 Hypo-osmolality and hyponatremia: Secondary | ICD-10-CM | POA: Diagnosis not present

## 2016-12-13 DIAGNOSIS — R5381 Other malaise: Secondary | ICD-10-CM | POA: Diagnosis not present

## 2016-12-13 DIAGNOSIS — E43 Unspecified severe protein-calorie malnutrition: Secondary | ICD-10-CM | POA: Diagnosis not present

## 2016-12-13 DIAGNOSIS — M6281 Muscle weakness (generalized): Secondary | ICD-10-CM | POA: Diagnosis not present

## 2016-12-13 DIAGNOSIS — K5909 Other constipation: Secondary | ICD-10-CM | POA: Diagnosis not present

## 2016-12-13 DIAGNOSIS — I4891 Unspecified atrial fibrillation: Secondary | ICD-10-CM | POA: Diagnosis not present

## 2016-12-13 DIAGNOSIS — K5641 Fecal impaction: Secondary | ICD-10-CM | POA: Diagnosis not present

## 2016-12-13 DIAGNOSIS — E876 Hypokalemia: Secondary | ICD-10-CM | POA: Diagnosis not present

## 2016-12-13 DIAGNOSIS — R109 Unspecified abdominal pain: Secondary | ICD-10-CM | POA: Diagnosis not present

## 2016-12-13 DIAGNOSIS — N390 Urinary tract infection, site not specified: Secondary | ICD-10-CM | POA: Diagnosis not present

## 2016-12-13 DIAGNOSIS — F411 Generalized anxiety disorder: Secondary | ICD-10-CM | POA: Diagnosis not present

## 2016-12-14 ENCOUNTER — Encounter: Payer: Self-pay | Admitting: Internal Medicine

## 2016-12-14 ENCOUNTER — Non-Acute Institutional Stay (SKILLED_NURSING_FACILITY): Payer: PPO | Admitting: Internal Medicine

## 2016-12-14 DIAGNOSIS — E43 Unspecified severe protein-calorie malnutrition: Secondary | ICD-10-CM | POA: Diagnosis not present

## 2016-12-14 DIAGNOSIS — I10 Essential (primary) hypertension: Secondary | ICD-10-CM | POA: Diagnosis not present

## 2016-12-14 DIAGNOSIS — R5381 Other malaise: Secondary | ICD-10-CM

## 2016-12-14 DIAGNOSIS — Z9049 Acquired absence of other specified parts of digestive tract: Secondary | ICD-10-CM | POA: Diagnosis not present

## 2016-12-14 DIAGNOSIS — K5909 Other constipation: Secondary | ICD-10-CM

## 2016-12-14 DIAGNOSIS — I482 Chronic atrial fibrillation, unspecified: Secondary | ICD-10-CM

## 2016-12-14 NOTE — Progress Notes (Signed)
Patient ID: [name], male   DOB: 01-Apr-1951, 67 y.o.   MRN: [id number]    HISTORY AND PHYSICAL   DATE: 12/14/2016  Location:    [place] Room Number: 101 A Place of Service: SNF (31)   Extended Emergency Contact Information Primary Emergency Contact: Kinton,Randi  [place] Mobile Phone: [phone] Relation: Friend  Advanced Directive information Does Patient Have a Medical Advance Directive?: No, Would patient like information on creating a medical advance directive?: No - Patient declined  Chief Complaint  Patient presents with  . Readmit To SNF    HPI:  66 yo male seen today as a readmission into SNF following hospital stay for fecal impaction, afib with RVR, rectal pain, right colon CA and anxiety. He was admitted with increased rectal pain -->plan to have disimpaction under anesthesia. Pt had afib with RVR in 120s. He underwent disimpaction on 12/24th by Dr Lucia Gaskins. Bowel regimen --> colace and mirilax. HR improved without change meds. CT abd/pelvis revealed blood pdts layering within dependent portion of RLQ anastomosis; post op small volume pneumoperitoneum. Hgb 10.5; Cr 0.51; B12 level 211; albumin 2.8. He presents to SNF for short term care.  Today he reports c/a constipation. He is unsure if he is taking colace and miralax. No abdominal pain, f/c. No rectal pain. No palpitations. No nursing issues. No falls. Appetite ok and sleeping well.  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 Cr now nml  0.51   Depression with anxiety - mood stable on buspar 15 mg three times daily   Chronic Constipation - s/p fecal impaction that req'd disimpaction under anesthesia. Stable on colace  twice daily and miralax daily. He takes zantac daily  Hx glucose intolerance - A1c 4.9%  Hx anemia - Hgb 10.5. B12 level 211.  Past Medical History:  Diagnosis Date  . Atrial fibrillation ([place])   . Cataract   . Diabetes mellitus without complication ([place])   . Hyperlipidemia   . Hypertension   . Lichen planus    bilateral legs    Past Surgical History:  Procedure Laterality Date  . APPENDECTOMY    . COLON RESECTION N/A 11/22/2016   Procedure: HAND ASSISTED LAPAROSCOPIC COLON RESECTION;  Surgeon: [name], MD;  Location: [facility];  Service: General;  Laterality: N/A;  . COLONOSCOPY N/A 11/17/2016   Procedure: COLONOSCOPY;  Surgeon: [name], MD;  Location: [facility];  Service: Endoscopy;  Laterality: N/A;  . FRACTURE SURGERY    . MULTIPLE TOOTH EXTRACTIONS    . RECTAL EXAM UNDER ANESTHESIA N/A 12/05/2016   Procedure: RECTAL EXAM UNDER ANESTHESIA, DISIMPACTION;  Surgeon: Alphonsa Overall, MD;  Location: [facility];  Service: General;  Laterality: N/A;    Patient Care Team: Sandi Mariscal, MD as PCP - General (Internal Medicine) [name], MD as Consulting Physician (Gastroenterology)  Social History   Social History  . Marital status: Unknown    Spouse name: N/A  . Number of children: N/A  . Years of education: N/A   Occupational History  . unemployed    Social History Main Topics  . Smoking status: Former Smoker    Types: Pipe    Quit  date: 09/14/1983  . Smokeless tobacco: Never Used  . Alcohol use No  . Drug use: No  . Sexual activity: No   Other Topics Concern  . Not on file   Social History Narrative  . No narrative on file     reports that he quit smoking about 33 years ago. His smoking use included Pipe. He has never used smokeless tobacco. He reports that he does not drink alcohol or use drugs.  Family History  Problem Relation Age of Onset  . Brain cancer Mother   . Alzheimer's disease Father   . Cancer Maternal Grandmother   . Heart attack  Maternal Grandfather   . Pneumonia Paternal Grandfather    Family Status  Relation Status  . Mother Deceased  . Father Deceased  . Maternal Grandmother Deceased  . Maternal Grandfather Deceased  . Paternal Grandfather Deceased  . Sister Alive  . Brother Alive  . Paternal Grandmother Deceased     There is no immunization history on file for this patient.  Allergies  Allergen Reactions  . Other     [name] bandage (self-gripping) - erythema, itching    Medications: Patient's Medications  New Prescriptions   No medications on file  Previous Medications   ACETAMINOPHEN (TYLENOL) 325 MG TABLET    Take 2 tablets (650 mg total) by mouth every 6 (six) hours as needed (for pain and/or Fever >/= 101).   ASPIRIN EC 81 MG EC TABLET    Take 1 tablet (81 mg total) by mouth daily.   BUSPIRONE (BUSPAR) 7.5 MG TABLET    Take 1 tablet (7.5 mg total) by mouth 3 (three) times daily.   DIGOXIN (LANOXIN) 0.125 MG TABLET    Take 1 tablet (0.125 mg total) by mouth daily.   DOCUSATE SODIUM (COLACE) 100 MG CAPSULE    Take 1 capsule (100 mg total) by mouth 2 (two) times daily.   HYDROCORTISONE (ANUSOL-HC) 2.5 % RECTAL CREAM    Place rectally every 12 (twelve) hours as needed for hemorrhoids or itching.   LIVER OIL-ZINC OXIDE (DESITIN) 40 % OINTMENT    Apply topically as needed for irritation.   METOPROLOL TARTRATE 75 MG TABS    Take 75 mg by mouth 2 (two) times daily.   MULTIPLE VITAMIN (MULTIVITAMIN) CAPSULE    Take 1 capsule by mouth daily.   POLYETHYLENE GLYCOL (MIRALAX) PACKET    Take 17 g by mouth daily. Hold for diarrhea   RANITIDINE (ZANTAC) 150 MG CAPSULE    Take 150 mg by mouth daily as needed for heartburn.    TAMSULOSIN (FLOMAX) 0.4 MG CAPS CAPSULE    Take 1 capsule (0.4 mg total) by mouth daily.   UNABLE TO FIND    Med Name: House 2.0 take 237 mL four times daily  Modified Medications   No medications on file  Discontinued Medications   FEEDING SUPPLEMENT, ENSURE ENLIVE,  (ENSURE ENLIVE) LIQD    Take 237 mLs by mouth 4 (four) times daily.    Review of Systems  Gastrointestinal: Positive for constipation.  All other systems reviewed and are negative.   Vitals:   12/14/16 1105  BP: 107/68  Pulse: 82  Resp: 18  Temp: 98.6 F (37 C)  TempSrc: Oral  SpO2: 98%  Weight: 222 lb (100.7 kg)  Height: 6' (1.829 m)   Body mass index is 30.11 kg/m.  Physical Exam  Constitutional: He is oriented to person, place, and time. He appears well-developed and well-nourished.  Looks well in NAD  HENT:  Mouth/Throat: Oropharynx is clear and moist. No oropharyngeal exudate.  Eyes: Pupils are equal, round, and reactive to light. No scleral icterus.  Neck: Neck supple. Carotid bruit is not present. No thyromegaly present.  Cardiovascular: Normal rate and intact distal pulses.  An irregularly irregular rhythm present. Exam reveals no gallop and no friction rub.   Murmur heard.  Systolic murmur is present with a grade of 1/6  no distal LE swelling. No calf TTP  Pulmonary/Chest: Effort normal and breath sounds normal. He has no wheezes. He has no rales. He exhibits no tenderness.  Abdominal: Soft. Bowel sounds are normal. He exhibits no distension, no abdominal bruit, no pulsatile midline mass and no mass. There is no hepatomegaly. There is tenderness (generalized). There is no rebound and no guarding.  Midline surgical incisional scar present, NT with no redness  Musculoskeletal: He exhibits edema.  Lymphadenopathy:    He has no cervical adenopathy.  Neurological: He is alert and oriented to person, place, and time.  Skin: Skin is warm and dry. No rash noted.  Psychiatric: He has a normal mood and affect. His behavior is normal. Thought content normal.     Labs reviewed: Admission on 12/03/2016, Discharged on 12/08/2016  Component Date Value Ref Range Status  . Sodium 12/04/2016 134* 135 - 145 mmol/L Final  . Potassium 12/04/2016 3.9  3.5 - 5.1 mmol/L Final  .  Chloride 12/04/2016 100* 101 - 111 mmol/L Final  . CO2 12/04/2016 26  22 - 32 mmol/L Final  . Glucose, Bld 12/04/2016 119* 65 - 99 mg/dL Final  . BUN 12/04/2016 29* 6 - 20 mg/dL Final  . Creatinine, Ser 12/04/2016 0.71  0.61 - 1.24 mg/dL Final  . Calcium 12/04/2016 8.4* 8.9 - 10.3 mg/dL Final  . Total Protein 12/04/2016 6.5  6.5 - 8.1 g/dL Final  . Albumin 12/04/2016 3.4* 3.5 - 5.0 g/dL Final  . AST 12/04/2016 27  15 - 41 U/L Final  . ALT 12/04/2016 27  17 - 63 U/L Final  . Alkaline Phosphatase 12/04/2016 79  38 - 126 U/L Final  . Total Bilirubin 12/04/2016 1.0  0.3 - 1.2 mg/dL Final  . GFR calc non Af Amer 12/04/2016 >60  >60 mL/min Final  . GFR calc Af Amer 12/04/2016 >60  >60 mL/min Final   Comment: (NOTE) The eGFR has been calculated using the CKD EPI equation. This calculation has not been validated in all clinical situations. eGFR's persistently <60 mL/min signify possible Chronic Kidney Disease.   . Anion gap 12/04/2016 8  5 - 15 Final  . Lipase 12/04/2016 18  11 - 51 U/L Final  . WBC 12/04/2016 8.1  4.0 - 10.5 K/uL Final  . RBC 12/04/2016 3.44* 4.22 - 5.81 MIL/uL Final  . Hemoglobin 12/04/2016 11.6* 13.0 - 17.0 g/dL Final  . HCT 12/04/2016 34.6* 39.0 - 52.0 % Final  . MCV 12/04/2016 100.6* 78.0 - 100.0 fL Final  . MCH 12/04/2016 33.7  26.0 - 34.0 pg Final  . MCHC 12/04/2016 33.5  30.0 - 36.0 g/dL Final  . RDW 12/04/2016 17.4* 11.5 - 15.5 % Final  . Platelets 12/04/2016 334  150 - 400 K/uL Final  . Neutrophils Relative % 12/04/2016 74  % Final  . Neutro Abs 12/04/2016 6.0  1.7 - 7.7 K/uL Final  . Lymphocytes Relative 12/04/2016 19  % Final  . Lymphs Abs 12/04/2016 1.5  0.7 - 4.0 K/uL Final  . Monocytes Relative 12/04/2016  4  % Final  . Monocytes Absolute 12/04/2016 0.4  0.1 - 1.0 K/uL Final  . Eosinophils Relative 12/04/2016 3  % Final  . Eosinophils Absolute 12/04/2016 0.2  0.0 - 0.7 K/uL Final  . Basophils Relative 12/04/2016 0  % Final  . Basophils Absolute  12/04/2016 0.0  0.0 - 0.1 K/uL Final  . Color, Urine 12/04/2016 YELLOW  YELLOW Final  . APPearance 12/04/2016 CLEAR  CLEAR Final  . Specific Gravity, Urine 12/04/2016 1.033* 1.005 - 1.030 Final  . pH 12/04/2016 5.0  5.0 - 8.0 Final  . Glucose, UA 12/04/2016 50* NEGATIVE mg/dL Final  . Hgb urine dipstick 12/04/2016 NEGATIVE  NEGATIVE Final  . Bilirubin Urine 12/04/2016 NEGATIVE  NEGATIVE Final  . Ketones, ur 12/04/2016 NEGATIVE  NEGATIVE mg/dL Final  . Protein, ur 12/04/2016 NEGATIVE  NEGATIVE mg/dL Final  . Nitrite 12/04/2016 NEGATIVE  NEGATIVE Final  . Leukocytes, UA 12/04/2016 NEGATIVE  NEGATIVE Final  . Digoxin Level 12/04/2016 0.2* 0.8 - 2.0 ng/mL Final  . Hgb A1c MFr Bld 12/05/2016 4.9  4.8 - 5.6 % Final   Comment: (NOTE)         Pre-diabetes: 5.7 - 6.4         Diabetes: >6.4         Glycemic control for adults with diabetes: <7.0   . Mean Plasma Glucose 12/05/2016 94  mg/dL Final   Comment: (NOTE) Performed At: [facility] [place], [place] [id number] Lindon Romp MD UL:[id number]   . Glucose-Capillary 12/04/2016 85  65 - 99 mg/dL Final  . Sodium 12/05/2016 138  135 - 145 mmol/L Final  . Potassium 12/05/2016 3.8  3.5 - 5.1 mmol/L Final  . Chloride 12/05/2016 107  101 - 111 mmol/L Final  . CO2 12/05/2016 24  22 - 32 mmol/L Final  . Glucose, Bld 12/05/2016 85  65 - 99 mg/dL Final  . BUN 12/05/2016 18  6 - 20 mg/dL Final  . Creatinine, Ser 12/05/2016 0.49* 0.61 - 1.24 mg/dL Final  . Calcium 12/05/2016 7.8* 8.9 - 10.3 mg/dL Final  . Total Protein 12/05/2016 5.2* 6.5 - 8.1 g/dL Final  . Albumin 12/05/2016 2.8* 3.5 - 5.0 g/dL Final  . AST 12/05/2016 18  15 - 41 U/L Final  . ALT 12/05/2016 22  17 - 63 U/L Final  . Alkaline Phosphatase 12/05/2016 63  38 - 126 U/L Final  . Total Bilirubin 12/05/2016 0.8  0.3 - 1.2 mg/dL Final  . GFR calc non Af Amer 12/05/2016 >60  >60 mL/min Final  . GFR calc Af Amer 12/05/2016 >60  >60 mL/min Final   Comment:  (NOTE) The eGFR has been calculated using the CKD EPI equation. This calculation has not been validated in all clinical situations. eGFR's persistently <60 mL/min signify possible Chronic Kidney Disease.   . Anion gap 12/05/2016 7  5 - 15 Final  . WBC 12/05/2016 4.2  4.0 - 10.5 K/uL Final  . RBC 12/05/2016 2.79* 4.22 - 5.81 MIL/uL Final  . Hemoglobin 12/05/2016 9.7* 13.0 - 17.0 g/dL Final  . HCT 12/05/2016 28.8* 39.0 - 52.0 % Final  . MCV 12/05/2016 103.2* 78.0 - 100.0 fL Final  . MCH 12/05/2016 34.8* 26.0 - 34.0 pg Final  . MCHC 12/05/2016 33.7  30.0 - 36.0 g/dL Final  . RDW 12/05/2016 17.2* 11.5 - 15.5 % Final  . Platelets 12/05/2016 255  150 - 400 K/uL Final  . Glucose-Capillary 12/04/2016 86  65 - 99 mg/dL  Final  . Glucose-Capillary 12/05/2016 79  65 - 99 mg/dL Final  . Glucose-Capillary 12/05/2016 83  65 - 99 mg/dL Final  . Glucose-Capillary 12/05/2016 71  65 - 99 mg/dL Final  . Vitamin B-12 12/06/2016 211  180 - 914 pg/mL Final   Comment: (NOTE) This assay is not validated for testing neonatal or myeloproliferative syndrome specimens for Vitamin B12 levels. Performed at [facility]   . Folate 12/06/2016 10.8  >5.9 ng/mL Final  . WBC 12/06/2016 5.0  4.0 - 10.5 K/uL Final  . RBC 12/06/2016 3.00* 4.22 - 5.81 MIL/uL Final  . Hemoglobin 12/06/2016 10.5* 13.0 - 17.0 g/dL Final  . HCT 12/06/2016 31.2* 39.0 - 52.0 % Final  . MCV 12/06/2016 104.0* 78.0 - 100.0 fL Final  . MCH 12/06/2016 35.0* 26.0 - 34.0 pg Final  . MCHC 12/06/2016 33.7  30.0 - 36.0 g/dL Final  . RDW 12/06/2016 17.1* 11.5 - 15.5 % Final  . Platelets 12/06/2016 255  150 - 400 K/uL Final  . Sodium 12/06/2016 139  135 - 145 mmol/L Final  . Potassium 12/06/2016 4.0  3.5 - 5.1 mmol/L Final  . Chloride 12/06/2016 108  101 - 111 mmol/L Final  . CO2 12/06/2016 24  22 - 32 mmol/L Final  . Glucose, Bld 12/06/2016 80  65 - 99 mg/dL Final  . BUN 12/06/2016 9  6 - 20 mg/dL Final  . Creatinine, Ser 12/06/2016 0.51*  0.61 - 1.24 mg/dL Final  . Calcium 12/06/2016 8.1* 8.9 - 10.3 mg/dL Final  . GFR calc non Af Amer 12/06/2016 >60  >60 mL/min Final  . GFR calc Af Amer 12/06/2016 >60  >60 mL/min Final   Comment: (NOTE) The eGFR has been calculated using the CKD EPI equation. This calculation has not been validated in all clinical situations. eGFR's persistently <60 mL/min signify possible Chronic Kidney Disease.   . Anion gap 12/06/2016 7  5 - 15 Final  . Glucose-Capillary 12/05/2016 89  65 - 99 mg/dL Final  . Glucose-Capillary 12/06/2016 74  65 - 99 mg/dL Final  . Comment 1 12/06/2016 Notify RN   Final  . Comment 2 12/06/2016 Document in Chart   Final  . Glucose-Capillary 12/06/2016 94  65 - 99 mg/dL Final  . Glucose-Capillary 12/06/2016 143* 65 - 99 mg/dL Final  . Comment 1 12/06/2016 Notify RN   Final  . Comment 2 12/06/2016 Document in Chart   Final  . Glucose-Capillary 12/06/2016 128* 65 - 99 mg/dL Final  . Glucose-Capillary 12/07/2016 109* 65 - 99 mg/dL Final  . Comment 1 12/07/2016 Notify RN   Final  . Comment 2 12/07/2016 Document in Chart   Final  . Glucose-Capillary 12/07/2016 152* 65 - 99 mg/dL Final  . Comment 1 12/07/2016 Notify RN   Final  . Comment 2 12/07/2016 Document in Chart   Final  . Glucose-Capillary 12/07/2016 130* 65 - 99 mg/dL Final  . Glucose-Capillary 12/07/2016 136* 65 - 99 mg/dL Final  . Glucose-Capillary 12/08/2016 107* 65 - 99 mg/dL Final  . Glucose-Capillary 12/08/2016 133* 65 - 99 mg/dL Final  Admission on 11/08/2016, Discharged on 11/26/2016  No results displayed because visit has over 200 results.      Ct Abdomen Pelvis W Contrast  Result Date: 12/04/2016 CLINICAL DATA:  Abdominal pain EXAM: CT ABDOMEN AND PELVIS WITH CONTRAST TECHNIQUE: Multidetector CT imaging of the abdomen and pelvis was performed using the standard protocol following bolus administration of intravenous contrast. CONTRAST:  162m ISOVUE-300 IOPAMIDOL (ISOVUE-300)  INJECTION 61% COMPARISON:   CT abdomen pelvis 11/08/2016 FINDINGS: Lower chest: There is free air within the anterior pneumomediastinum. Small bilateral pleural effusions. Hepatobiliary: Normal hepatic size and contours without focal liver lesion. No perihepatic ascites. No intra- or extrahepatic biliary dilatation. Normal gallbladder. Pancreas: Normal pancreatic contours and enhancement. No peripancreatic fluid collection or pancreatic ductal dilatation. Spleen: Multiple hypoattenuating foci within the spleen. These are unchanged from the prior study. Adrenals/Urinary Tract: Unchanged right adrenal nodule measuring 1.6 cm. Normal left adrenal gland. Normal right kidney. Multiple left renal sinus cysts. Stomach/Bowel: There is a large stool ball within the rectum. No evidence of stercoral inflammation. Moderate amount of stool within the descending colon. There is an anastomosis within the right lower quadrant. Within the dependent portion of the anastomotic segment, there is a focal area of hyperattenuation suggesting hemorrhage (series 2, image 46 and series 5, image 78). There is surrounding inflammatory stranding within the fat. There is a small amount of pneumoperitoneum in the right upper quadrant. Vascular/Lymphatic: There is atherosclerotic calcification of the non aneurysmal abdominal aorta. No abdominal or pelvic adenopathy. Reproductive: Normal prostate and seminal vesicles. Musculoskeletal: Bilateral sacroiliac bridging osteophytes. Multilevel lumbar facet arthrosis and osteophytosis. Other: Small amount of gas tracking into both inguinal canals. IMPRESSION: 1. Blood products layering within the dependent portion of the right lower quadrant anastomosis, concerning for hemorrhage at the operative site. There is surrounding inflammatory change with a small amount of adjacent free fluid. There is no well-defined collection, but a small anastomotic leak would be difficult to exclude. 2. Small volume pneumoperitoneum and air extending  into both inguinal canals in the anterior mediastinum is likely postoperative. 3. Small bilateral pleural effusions. 4. Aortic atherosclerosis. Electronically Signed   By: Ulyses Jarred M.D.   On: 12/04/2016 05:21     Assessment/Plan   ICD-9-CM ICD-10-CM   1. Other constipation 564.09 K59.09   2. S/P partial colectomy V45.89 Z90.49   3. Protein-calorie malnutrition, severe 262 E43   4. Chronic atrial fibrillation (HCC) 427.31 I48.2   5. Essential hypertension, benign 401.1 I10   6. Physical deconditioning 799.3 R53.81     Take colace and miralax daily  t/c linzess 39mg daily for constipation  Follow BMP and digoxin level  Cont other meds as ordered  PT/OT/ST as ordered  f/u with specialists as scheduled  GOAL: short term rehab and d/c home when medically appropriate. Communicated with pt and nursing.  Will follow  Abryanna Musolino S. CPerlie Gold PNorth Metro Medical Centerand Adult Medicine [address]GVermillion [place] [phone] (Monday-Friday 8 AM - 5 PM) ([phone] 5 PM and follow prompts

## 2016-12-23 ENCOUNTER — Encounter: Payer: Self-pay | Admitting: *Deleted

## 2016-12-31 ENCOUNTER — Other Ambulatory Visit: Payer: Self-pay | Admitting: *Deleted

## 2016-12-31 ENCOUNTER — Encounter: Payer: Self-pay | Admitting: *Deleted

## 2016-12-31 NOTE — Patient Outreach (Signed)
[facility]) Care Management  12/31/2016  [name] 11-Feb-1951 [id number]   CSW was able to make contact with patient today to perform the initial assessment, as well as assess and assist with social work needs and services, when [place] met with patient at [facility] [place] where patient currently resides to receive short-term rehabilitative services.  CSW introduced self, explained role and types of services provided through [facility] ([place] [facility]).  CSW further explained to patient that CSW will be working with patient to assist with possible discharge planning needs and services from the skilled nursing facility, as CSW understands that patient was homeless prior to his most recent hospital admission.  CSW obtained two HIPAA compliant identifiers from patient, which included patient's name and date of birth. Patient admits that he is not completely decided upon his discharge plan from [facility]; however, he is hopeful that he will be able to return home to live with his friend, Clinton Quant.  Patient did not give CSW verbal or written authorization to speak with Mr. Valda Lamb to confirm.  CSW inquired as to whether or not patient has a back-up plan, in the event that Mr. Jim Like home is not an option.  Patient mentioned returning to [facility], providing CSW with a contact name of [place].  CSW will converse with the discharge planning coordinator/social worker at [facility] to coordinate discharge planning arrangements for patient.  During CSW's visit with patient, patient declined working with therapies today, both physical and occupational.  CSW reminded patient of the importance and benefits to getting out of bed to receive strengthening, mobility, safety, ambulation, etc.  CSW agreed to meet with patient for the next routine visit  at [facility] on Monday, January 29th. Nat Christen, BSW, MSW, LCSW  Licensed Education officer, [facility]  Mailing [place] [address], [place], [place] [address] Physical Address-[address], [place], [place] [address] Toll Free Main # [phone] Fax # [phone] Cell # [phone]  Office # [phone] [email or web address]

## 2017-01-03 ENCOUNTER — Non-Acute Institutional Stay (SKILLED_NURSING_FACILITY): Payer: PPO | Admitting: Internal Medicine

## 2017-01-03 ENCOUNTER — Encounter: Payer: Self-pay | Admitting: Internal Medicine

## 2017-01-03 DIAGNOSIS — I10 Essential (primary) hypertension: Secondary | ICD-10-CM | POA: Diagnosis not present

## 2017-01-03 DIAGNOSIS — N138 Other obstructive and reflux uropathy: Secondary | ICD-10-CM | POA: Diagnosis not present

## 2017-01-03 DIAGNOSIS — F418 Other specified anxiety disorders: Secondary | ICD-10-CM

## 2017-01-03 DIAGNOSIS — E43 Unspecified severe protein-calorie malnutrition: Secondary | ICD-10-CM | POA: Diagnosis not present

## 2017-01-03 DIAGNOSIS — C189 Malignant neoplasm of colon, unspecified: Secondary | ICD-10-CM

## 2017-01-03 DIAGNOSIS — R5381 Other malaise: Secondary | ICD-10-CM

## 2017-01-03 DIAGNOSIS — Z9049 Acquired absence of other specified parts of digestive tract: Secondary | ICD-10-CM

## 2017-01-03 DIAGNOSIS — I482 Chronic atrial fibrillation, unspecified: Secondary | ICD-10-CM

## 2017-01-03 DIAGNOSIS — K5909 Other constipation: Secondary | ICD-10-CM | POA: Diagnosis not present

## 2017-01-03 DIAGNOSIS — D649 Anemia, unspecified: Secondary | ICD-10-CM | POA: Diagnosis not present

## 2017-01-03 DIAGNOSIS — N401 Enlarged prostate with lower urinary tract symptoms: Secondary | ICD-10-CM | POA: Diagnosis not present

## 2017-01-03 DIAGNOSIS — E669 Obesity, unspecified: Secondary | ICD-10-CM

## 2017-01-03 DIAGNOSIS — E1169 Type 2 diabetes mellitus with other specified complication: Secondary | ICD-10-CM

## 2017-01-03 NOTE — Progress Notes (Signed)
Patient ID: Machael Raine, male   DOB: [birth year], 66 y.o.   MRN: [id number]    DATE: 01/03/2017  Location:    [facility] Room Number: 101 A Place of Service: SNF (31)   Extended Emergency Contact Information Primary Emergency Contact: Kinton,Randi  [place] Mobile Phone: [phone] Relation: Friend  Advanced Directive information Does Patient Have a Medical Advance Directive?: Yes, Type of Advance Directive: Out of facility DNR (pink MOST or yellow form), Pre-existing out of facility DNR order (yellow form or pink MOST form): Pink MOST form placed in chart (order not valid for inpatient use) (Attempt CPR), Does patient want to make changes to medical advance directive?: No - Patient declined  Chief Complaint  Patient presents with  . Discharge Note    HPI:  66 yo male seen today for d/c from SNF to ALF. He will be d/c'd with HH PT/OT/RN. He has no concerns today.  BRIEF SUMMARY OF HOSPITAL/SNF STAY:  He was initially admitted into SNF following prolonged hospital stay for cecal mass/fecal impaction/constipation, afib with RVR not on anticoagulation due to pt refusal, BPH with urinary retention, hx glucose intolerance, A/CKD stage 3, severe protein calorie malnutrition, anemia, UTI, homelessness, DM, depression. 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 Sx then performed lap assisted partial colectomy on 11/22/16. Path results revealed "PREDOMINANTLY INTRAMUCOSAL ADENOCARCINOMA OF THE COLON, GRADE 1 , BACKGROUND TUBULOVILLOUS ADENOMA (5 CM); THE TUMOR FOCALLY INVADES SUBMUCOSA (PT1); ALL MARGINS OF RESECTION ARE NEGATIVE FOR CARCINOMA; FIFTEEN BENIGN LYMPH NODES (0/15); TUBULAR ADENOMA (X3)". Hgb 10.2; Na 128-->133; K 3.3-->4; Cr 1.36-->0.49; albumin 2.3; Tbili 2.1-->0.6 at d/c. He was then readmitted into SNF following hospital stay for fecal  impaction, afib with RVR, rectal pain, right colon CA and anxiety. At that time, he had increased rectal pain -->plan to have disimpaction under anesthesia. Pt had afib with RVR in 120s. He underwent disimpaction on 12/24th by Dr Lucia Gaskins. Bowel regimen --> colace and mirilax. HR improved without change meds. CT abd/pelvis revealed blood pdts layering within dependent portion of RLQ anastomosis; post op small volume pneumoperitoneum. Hgb 10.5; Cr 0.51; B12 level 211; albumin 2.8. He was d/c'd back to SNF for short term rehab.  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 Cr now nml  0.51   Depression with anxiety - mood stable on buspar 7.5 mg three times daily   Chronic Constipation - s/p fecal impaction that req'd disimpaction under anesthesia. Stable on colace twice daily and miralax daily. He takes zantac daily  Hx glucose intolerance - A1c 4.9%  Hx anemia - Hgb 10.5. B12 level 211.   Past Medical History:  Diagnosis Date  . Atrial fibrillation ([place])   . Cataract   . Diabetes mellitus without complication ([place])   . Hyperlipidemia   . Hypertension   . Lichen planus    bilateral legs    Past Surgical History:  Procedure Laterality Date  . APPENDECTOMY    . COLON RESECTION N/A 11/22/2016   Procedure: HAND ASSISTED LAPAROSCOPIC COLON RESECTION;  Surgeon: [name], MD;  Location: [facility];  Service: General;  Laterality: N/A;  . COLONOSCOPY N/A 11/17/2016   Procedure: COLONOSCOPY;  Surgeon: [name], MD;  Location: [facility];  Service: Endoscopy;  Laterality: N/A;  . FRACTURE SURGERY    . MULTIPLE TOOTH EXTRACTIONS    . RECTAL EXAM UNDER ANESTHESIA N/A 12/05/2016   Procedure: RECTAL EXAM UNDER  ANESTHESIA, DISIMPACTION;  Surgeon: Alphonsa Overall, MD;  Location: [facility];  Service: General;  Laterality: N/A;    Patient Care Team: Sandi Mariscal, MD as PCP - General (Internal Medicine) [name], MD as Consulting Physician (Gastroenterology) Francis Gaines, LCSW as [facility]  Social History   Social History  . Marital status: Unknown    Spouse name: N/A  . Number of children: N/A  . Years of education: N/A   Occupational History  . unemployed    Social History Main Topics  . Smoking status: Former Smoker    Types: Pipe    Quit date: 09/14/1983  . Smokeless tobacco: Never Used  . Alcohol use No  . Drug use: No  . Sexual activity: No   Other Topics Concern  . Not on file   Social History Narrative  . No narrative on file     reports that he quit smoking about 33 years ago. His smoking use included Pipe. He has never used smokeless tobacco. He reports that he does not drink alcohol or use drugs.  Family History  Problem Relation Age of Onset  . Brain cancer Mother   . Alzheimer's disease Father   . Cancer Maternal Grandmother   . Heart attack Maternal Grandfather   . Pneumonia Paternal Grandfather    Family Status  Relation Status  . Mother Deceased  . Father Deceased  . Maternal Grandmother Deceased  . Maternal Grandfather Deceased  . Paternal Grandfather Deceased  . Sister Alive  . Brother Alive  . Paternal Grandmother Deceased     There is no immunization history on file for this patient.  Allergies  Allergen Reactions  . Other     [name] bandage (self-gripping) - erythema, itching    Medications: Patient's Medications  New Prescriptions   No medications on file  Previous Medications   ACETAMINOPHEN (TYLENOL) 325 MG TABLET    Take 2 tablets (650 mg total) by mouth every 6 (six) hours as needed (for pain and/or Fever >/= 101).   ASPIRIN EC 81 MG EC TABLET    Take 1 tablet (81 mg total) by mouth daily.    BUSPIRONE (BUSPAR) 7.5 MG TABLET    Take 1 tablet (7.5 mg total) by mouth 3 (three) times daily.   DIGOXIN (LANOXIN) 0.125 MG TABLET    Take 1 tablet (0.125 mg total) by mouth daily.   DOCUSATE SODIUM (COLACE) 100 MG CAPSULE    Take 1 capsule (100 mg total) by mouth 2 (two) times daily.   HYDROCORTISONE (ANUSOL-HC) 2.5 % RECTAL CREAM    Place rectally every 12 (twelve) hours as needed for hemorrhoids or itching.   LIVER OIL-ZINC OXIDE (DESITIN) 40 % OINTMENT    Apply topically as needed for irritation.   METOPROLOL TARTRATE 75 MG TABS    Take 75 mg by mouth 2 (two) times daily.   MULTIPLE VITAMIN (MULTIVITAMIN) CAPSULE    Take 1 capsule by mouth daily.   POLYETHYLENE GLYCOL (MIRALAX) PACKET    Take 17 g by mouth daily. Hold for diarrhea   RANITIDINE (ZANTAC) 150 MG CAPSULE  Take 150 mg by mouth daily as needed for heartburn.    TAMSULOSIN (FLOMAX) 0.4 MG CAPS CAPSULE    Take 1 capsule (0.4 mg total) by mouth daily.   UNABLE TO FIND    Med Name: House 2.0 take 237 mL four times daily  Modified Medications   No medications on file  Discontinued Medications   No medications on file    Review of Systems  Cardiovascular: Negative for palpitations.  Gastrointestinal: Negative for abdominal distention and constipation.  All other systems reviewed and are negative.   Vitals:   01/03/17 1213  BP: 106/62  Pulse: 80  Resp: 17  Temp: 97.4 F (36.3 C)  TempSrc: Oral  SpO2: 99%  Weight: 222 lb (100.7 kg)   Body mass index is 30.11 kg/m.  Physical Exam  Constitutional: He is oriented to person, place, and time. He appears well-developed. No distress.  Neurological: He is alert and oriented to person, place, and time.  Skin: Skin is warm and dry. No rash noted.  Psychiatric: He has a normal mood and affect. His behavior is normal. Judgment and thought content normal.     Labs reviewed: Nursing Home on 01/03/2017  Component Date Value Ref Range Status  . Hemoglobin 12/01/2016  10.8* 13.5 - 17.5 g/dL Final  . HCT 12/01/2016 33* 41 - 53 % Final  . Platelets 12/01/2016 241  150 - 399 K/L Final  . WBC 12/01/2016 5.9  10^3/mL Final  . Glucose 12/01/2016 131  mg/dL Final  . BUN 12/01/2016 13  4 - 21 mg/dL Final  . Creatinine 12/01/2016 0.5* 0.6 - 1.3 mg/dL Final  . Potassium 12/01/2016 4.3  3.4 - 5.3 mmol/L Final  . Sodium 12/01/2016 136* 137 - 147 mmol/L Final  Admission on 12/03/2016, Discharged on 12/08/2016  Component Date Value Ref Range Status  . Sodium 12/04/2016 134* 135 - 145 mmol/L Final  . Potassium 12/04/2016 3.9  3.5 - 5.1 mmol/L Final  . Chloride 12/04/2016 100* 101 - 111 mmol/L Final  . CO2 12/04/2016 26  22 - 32 mmol/L Final  . Glucose, Bld 12/04/2016 119* 65 - 99 mg/dL Final  . BUN 12/04/2016 29* 6 - 20 mg/dL Final  . Creatinine, Ser 12/04/2016 0.71  0.61 - 1.24 mg/dL Final  . Calcium 12/04/2016 8.4* 8.9 - 10.3 mg/dL Final  . Total Protein 12/04/2016 6.5  6.5 - 8.1 g/dL Final  . Albumin 12/04/2016 3.4* 3.5 - 5.0 g/dL Final  . AST 12/04/2016 27  15 - 41 U/L Final  . ALT 12/04/2016 27  17 - 63 U/L Final  . Alkaline Phosphatase 12/04/2016 79  38 - 126 U/L Final  . Total Bilirubin 12/04/2016 1.0  0.3 - 1.2 mg/dL Final  . GFR calc non Af Amer 12/04/2016 >60  >60 mL/min Final  . GFR calc Af Amer 12/04/2016 >60  >60 mL/min Final   Comment: (NOTE) The eGFR has been calculated using the CKD EPI equation. This calculation has not been validated in all clinical situations. eGFR's persistently <60 mL/min signify possible Chronic Kidney Disease.   . Anion gap 12/04/2016 8  5 - 15 Final  . Lipase 12/04/2016 18  11 - 51 U/L Final  . WBC 12/04/2016 8.1  4.0 - 10.5 K/uL Final  . RBC 12/04/2016 3.44* 4.22 - 5.81 MIL/uL Final  . Hemoglobin 12/04/2016 11.6* 13.0 - 17.0 g/dL Final  . HCT 12/04/2016 34.6* 39.0 - 52.0 % Final  . MCV 12/04/2016 100.6* 78.0 - 100.0 fL Final  .  MCH 12/04/2016 33.7  26.0 - 34.0 pg Final  . MCHC 12/04/2016 33.5  30.0 - 36.0 g/dL  Final  . RDW 12/04/2016 17.4* 11.5 - 15.5 % Final  . Platelets 12/04/2016 334  150 - 400 K/uL Final  . Neutrophils Relative % 12/04/2016 74  % Final  . Neutro Abs 12/04/2016 6.0  1.7 - 7.7 K/uL Final  . Lymphocytes Relative 12/04/2016 19  % Final  . Lymphs Abs 12/04/2016 1.5  0.7 - 4.0 K/uL Final  . Monocytes Relative 12/04/2016 4  % Final  . Monocytes Absolute 12/04/2016 0.4  0.1 - 1.0 K/uL Final  . Eosinophils Relative 12/04/2016 3  % Final  . Eosinophils Absolute 12/04/2016 0.2  0.0 - 0.7 K/uL Final  . Basophils Relative 12/04/2016 0  % Final  . Basophils Absolute 12/04/2016 0.0  0.0 - 0.1 K/uL Final  . Color, Urine 12/04/2016 YELLOW  YELLOW Final  . APPearance 12/04/2016 CLEAR  CLEAR Final  . Specific Gravity, Urine 12/04/2016 1.033* 1.005 - 1.030 Final  . pH 12/04/2016 5.0  5.0 - 8.0 Final  . Glucose, UA 12/04/2016 50* NEGATIVE mg/dL Final  . Hgb urine dipstick 12/04/2016 NEGATIVE  NEGATIVE Final  . Bilirubin Urine 12/04/2016 NEGATIVE  NEGATIVE Final  . Ketones, ur 12/04/2016 NEGATIVE  NEGATIVE mg/dL Final  . Protein, ur 12/04/2016 NEGATIVE  NEGATIVE mg/dL Final  . Nitrite 12/04/2016 NEGATIVE  NEGATIVE Final  . Leukocytes, UA 12/04/2016 NEGATIVE  NEGATIVE Final  . Digoxin Level 12/04/2016 0.2* 0.8 - 2.0 ng/mL Final  . Hgb A1c MFr Bld 12/04/2016 4.9  4.8 - 5.6 % Final   Comment: (NOTE)         Pre-diabetes: 5.7 - 6.4         Diabetes: >6.4         Glycemic control for adults with diabetes: <7.0   . Mean Plasma Glucose 12/04/2016 94  mg/dL Final   Comment: (NOTE) Performed At: [facility] [place], [place] [id number] Lindon Romp MD [id number]   . Glucose-Capillary 12/04/2016 85  65 - 99 mg/dL Final  . Sodium 12/05/2016 138  135 - 145 mmol/L Final  . Potassium 12/05/2016 3.8  3.5 - 5.1 mmol/L Final  . Chloride 12/05/2016 107  101 - 111 mmol/L Final  . CO2 12/05/2016 24  22 - 32 mmol/L Final  . Glucose, Bld 12/05/2016 85  65 - 99 mg/dL Final    . BUN 12/05/2016 18  6 - 20 mg/dL Final  . Creatinine, Ser 12/05/2016 0.49* 0.61 - 1.24 mg/dL Final  . Calcium 12/05/2016 7.8* 8.9 - 10.3 mg/dL Final  . Total Protein 12/05/2016 5.2* 6.5 - 8.1 g/dL Final  . Albumin 12/05/2016 2.8* 3.5 - 5.0 g/dL Final  . AST 12/05/2016 18  15 - 41 U/L Final  . ALT 12/05/2016 22  17 - 63 U/L Final  . Alkaline Phosphatase 12/05/2016 63  38 - 126 U/L Final  . Total Bilirubin 12/05/2016 0.8  0.3 - 1.2 mg/dL Final  . GFR calc non Af Amer 12/05/2016 >60  >60 mL/min Final  . GFR calc Af Amer 12/05/2016 >60  >60 mL/min Final   Comment: (NOTE) The eGFR has been calculated using the CKD EPI equation. This calculation has not been validated in all clinical situations. eGFR's persistently <60 mL/min signify possible Chronic Kidney Disease.   . Anion gap 12/05/2016 7  5 - 15 Final  . WBC 12/05/2016 4.2  4.0 - 10.5 K/uL Final  .  RBC 12/05/2016 2.79* 4.22 - 5.81 MIL/uL Final  . Hemoglobin 12/05/2016 9.7* 13.0 - 17.0 g/dL Final  . HCT 12/05/2016 28.8* 39.0 - 52.0 % Final  . MCV 12/05/2016 103.2* 78.0 - 100.0 fL Final  . MCH 12/05/2016 34.8* 26.0 - 34.0 pg Final  . MCHC 12/05/2016 33.7  30.0 - 36.0 g/dL Final  . RDW 12/05/2016 17.2* 11.5 - 15.5 % Final  . Platelets 12/05/2016 255  150 - 400 K/uL Final  . Glucose-Capillary 12/04/2016 86  65 - 99 mg/dL Final  . Glucose-Capillary 12/05/2016 79  65 - 99 mg/dL Final  . Glucose-Capillary 12/05/2016 83  65 - 99 mg/dL Final  . Glucose-Capillary 12/05/2016 71  65 - 99 mg/dL Final  . Vitamin B-12 12/06/2016 211  180 - 914 pg/mL Final   Comment: (NOTE) This assay is not validated for testing neonatal or myeloproliferative syndrome specimens for Vitamin B12 levels. Performed at [facility]   . Folate 12/06/2016 10.8  >5.9 ng/mL Final  . WBC 12/06/2016 5.0  4.0 - 10.5 K/uL Final  . RBC 12/06/2016 3.00* 4.22 - 5.81 MIL/uL Final  . Hemoglobin 12/06/2016 10.5* 13.0 - 17.0 g/dL Final  . HCT 12/06/2016 31.2* 39.0 -  52.0 % Final  . MCV 12/06/2016 104.0* 78.0 - 100.0 fL Final  . MCH 12/06/2016 35.0* 26.0 - 34.0 pg Final  . MCHC 12/06/2016 33.7  30.0 - 36.0 g/dL Final  . RDW 12/06/2016 17.1* 11.5 - 15.5 % Final  . Platelets 12/06/2016 255  150 - 400 K/uL Final  . Sodium 12/06/2016 139  135 - 145 mmol/L Final  . Potassium 12/06/2016 4.0  3.5 - 5.1 mmol/L Final  . Chloride 12/06/2016 108  101 - 111 mmol/L Final  . CO2 12/06/2016 24  22 - 32 mmol/L Final  . Glucose, Bld 12/06/2016 80  65 - 99 mg/dL Final  . BUN 12/06/2016 9  6 - 20 mg/dL Final  . Creatinine, Ser 12/06/2016 0.51* 0.61 - 1.24 mg/dL Final  . Calcium 12/06/2016 8.1* 8.9 - 10.3 mg/dL Final  . GFR calc non Af Amer 12/06/2016 >60  >60 mL/min Final  . GFR calc Af Amer 12/06/2016 >60  >60 mL/min Final   Comment: (NOTE) The eGFR has been calculated using the CKD EPI equation. This calculation has not been validated in all clinical situations. eGFR's persistently <60 mL/min signify possible Chronic Kidney Disease.   . Anion gap 12/06/2016 7  5 - 15 Final  . Glucose-Capillary 12/05/2016 89  65 - 99 mg/dL Final  . Glucose-Capillary 12/06/2016 74  65 - 99 mg/dL Final  . Comment 1 12/06/2016 Notify RN   Final  . Comment 2 12/06/2016 Document in Chart   Final  . Glucose-Capillary 12/06/2016 94  65 - 99 mg/dL Final  . Glucose-Capillary 12/06/2016 143* 65 - 99 mg/dL Final  . Comment 1 12/06/2016 Notify RN   Final  . Comment 2 12/06/2016 Document in Chart   Final  . Glucose-Capillary 12/06/2016 128* 65 - 99 mg/dL Final  . Glucose-Capillary 12/07/2016 109* 65 - 99 mg/dL Final  . Comment 1 12/07/2016 Notify RN   Final  . Comment 2 12/07/2016 Document in Chart   Final  . Glucose-Capillary 12/07/2016 152* 65 - 99 mg/dL Final  . Comment 1 12/07/2016 Notify RN   Final  . Comment 2 12/07/2016 Document in Chart   Final  . Glucose-Capillary 12/07/2016 130* 65 - 99 mg/dL Final  . Glucose-Capillary 12/07/2016 136* 65 - 99 mg/dL Final  .  Glucose-Capillary  12/08/2016 107* 65 - 99 mg/dL Final  . Glucose-Capillary 12/08/2016 133* 65 - 99 mg/dL Final  Admission on 11/08/2016, Discharged on 11/26/2016  No results displayed because visit has over 200 results.      No results found.   Assessment/Plan   ICD-9-CM ICD-10-CM   1. S/P partial colectomy V45.89 Z90.49   2. Physical deconditioning 799.3 R53.81   3. Other constipation 564.09 K59.09   4. Protein-calorie malnutrition, severe 262 E43   5. Chronic atrial fibrillation (HCC) 427.31 I48.2   6. Essential hypertension, benign 401.1 I10   7. BPH with obstruction/lower urinary tract symptoms 600.01 N40.1    599.69 N13.8   8. Diabetes mellitus type 2 in obese (HCC) 250.00 E11.69    278.00 E66.9   9. Adenocarcinoma of colon (HCC) 153.9 C18.9   10. Anemia, unspecified type 285.9 D64.9   11. Depression with anxiety 300.4 F41.8      Patient is being discharged with home health services:  PT/OT for strength/gait training, balance transfers; RN for medication management  Patient is being discharged with the following durable medical equipment: none    Patient has been advised to f/u with their PCP in 1-2 weeks to bring them up to date on their rehab stay.  They were provided with a 30 day supply of scripts for prescription medications and refills must be obtained from their PCP.  TIME SPENT (MINUTES): [place]. Perlie Gold  [facility] and Adult Medicine [address] [place],  [address] [phone] Cell (Monday-Friday 8 AM - 5 PM) [phone] After 5 PM and follow prompts

## 2017-01-07 DIAGNOSIS — I509 Heart failure, unspecified: Secondary | ICD-10-CM | POA: Diagnosis not present

## 2017-01-07 DIAGNOSIS — K5900 Constipation, unspecified: Secondary | ICD-10-CM | POA: Diagnosis not present

## 2017-01-07 DIAGNOSIS — K219 Gastro-esophageal reflux disease without esophagitis: Secondary | ICD-10-CM | POA: Diagnosis not present

## 2017-01-07 DIAGNOSIS — I10 Essential (primary) hypertension: Secondary | ICD-10-CM | POA: Diagnosis not present

## 2017-01-09 DIAGNOSIS — K5900 Constipation, unspecified: Secondary | ICD-10-CM | POA: Diagnosis not present

## 2017-01-09 DIAGNOSIS — N183 Chronic kidney disease, stage 3 (moderate): Secondary | ICD-10-CM | POA: Diagnosis not present

## 2017-01-09 DIAGNOSIS — F17200 Nicotine dependence, unspecified, uncomplicated: Secondary | ICD-10-CM | POA: Diagnosis not present

## 2017-01-09 DIAGNOSIS — E119 Type 2 diabetes mellitus without complications: Secondary | ICD-10-CM | POA: Diagnosis not present

## 2017-01-09 DIAGNOSIS — M6281 Muscle weakness (generalized): Secondary | ICD-10-CM | POA: Diagnosis not present

## 2017-01-09 DIAGNOSIS — R2689 Other abnormalities of gait and mobility: Secondary | ICD-10-CM | POA: Diagnosis not present

## 2017-01-09 DIAGNOSIS — Z87440 Personal history of urinary (tract) infections: Secondary | ICD-10-CM | POA: Diagnosis not present

## 2017-01-09 DIAGNOSIS — I4891 Unspecified atrial fibrillation: Secondary | ICD-10-CM | POA: Diagnosis not present

## 2017-01-09 DIAGNOSIS — I129 Hypertensive chronic kidney disease with stage 1 through stage 4 chronic kidney disease, or unspecified chronic kidney disease: Secondary | ICD-10-CM | POA: Diagnosis not present

## 2017-01-09 DIAGNOSIS — J449 Chronic obstructive pulmonary disease, unspecified: Secondary | ICD-10-CM | POA: Diagnosis not present

## 2017-01-10 ENCOUNTER — Other Ambulatory Visit: Payer: Self-pay | Admitting: *Deleted

## 2017-01-11 NOTE — Patient Outreach (Signed)
[facility]) Care Management  01/11/2017  [name] [birth year] [id number]   CSW drove out to [place] where patient was residing to receive short-term rehabilitative services, only to find that patient was no longer there, having been discharged on Monday, January 22nd, according to the receptionist at the facility.  CSW then attempted to contact patient by phone, without success.  CSW left a HIPAA compliant message for patient on voicemail and is currently awaiting a return call.  If CSW does not receive a return call from patient within the next week, CSW will make a second outreach attempt. Nat Christen, BSW, MSW, LCSW  Licensed Education officer, [facility]  Mailing [place] [address] [place], [place] [address] Physical Address-[address], [place], [place] [address] Toll Free Main # [phone] Fax # [phone] Cell # [phone]  Office # [phone] [email or web address]

## 2017-01-12 DIAGNOSIS — F17200 Nicotine dependence, unspecified, uncomplicated: Secondary | ICD-10-CM | POA: Diagnosis not present

## 2017-01-12 DIAGNOSIS — K5900 Constipation, unspecified: Secondary | ICD-10-CM | POA: Diagnosis not present

## 2017-01-12 DIAGNOSIS — M6281 Muscle weakness (generalized): Secondary | ICD-10-CM | POA: Diagnosis not present

## 2017-01-12 DIAGNOSIS — Z87440 Personal history of urinary (tract) infections: Secondary | ICD-10-CM | POA: Diagnosis not present

## 2017-01-12 DIAGNOSIS — J449 Chronic obstructive pulmonary disease, unspecified: Secondary | ICD-10-CM | POA: Diagnosis not present

## 2017-01-12 DIAGNOSIS — N183 Chronic kidney disease, stage 3 (moderate): Secondary | ICD-10-CM | POA: Diagnosis not present

## 2017-01-12 DIAGNOSIS — I129 Hypertensive chronic kidney disease with stage 1 through stage 4 chronic kidney disease, or unspecified chronic kidney disease: Secondary | ICD-10-CM | POA: Diagnosis not present

## 2017-01-12 DIAGNOSIS — E119 Type 2 diabetes mellitus without complications: Secondary | ICD-10-CM | POA: Diagnosis not present

## 2017-01-12 DIAGNOSIS — I4891 Unspecified atrial fibrillation: Secondary | ICD-10-CM | POA: Diagnosis not present

## 2017-01-12 DIAGNOSIS — R2689 Other abnormalities of gait and mobility: Secondary | ICD-10-CM | POA: Diagnosis not present

## 2017-01-13 DIAGNOSIS — I4891 Unspecified atrial fibrillation: Secondary | ICD-10-CM | POA: Diagnosis not present

## 2017-01-13 DIAGNOSIS — E119 Type 2 diabetes mellitus without complications: Secondary | ICD-10-CM | POA: Diagnosis not present

## 2017-01-13 DIAGNOSIS — M6281 Muscle weakness (generalized): Secondary | ICD-10-CM | POA: Diagnosis not present

## 2017-01-13 DIAGNOSIS — J449 Chronic obstructive pulmonary disease, unspecified: Secondary | ICD-10-CM | POA: Diagnosis not present

## 2017-01-13 DIAGNOSIS — K5900 Constipation, unspecified: Secondary | ICD-10-CM | POA: Diagnosis not present

## 2017-01-13 DIAGNOSIS — F17200 Nicotine dependence, unspecified, uncomplicated: Secondary | ICD-10-CM | POA: Diagnosis not present

## 2017-01-13 DIAGNOSIS — R2689 Other abnormalities of gait and mobility: Secondary | ICD-10-CM | POA: Diagnosis not present

## 2017-01-13 DIAGNOSIS — Z87440 Personal history of urinary (tract) infections: Secondary | ICD-10-CM | POA: Diagnosis not present

## 2017-01-13 DIAGNOSIS — I129 Hypertensive chronic kidney disease with stage 1 through stage 4 chronic kidney disease, or unspecified chronic kidney disease: Secondary | ICD-10-CM | POA: Diagnosis not present

## 2017-01-13 DIAGNOSIS — N183 Chronic kidney disease, stage 3 (moderate): Secondary | ICD-10-CM | POA: Diagnosis not present

## 2017-01-14 DIAGNOSIS — I129 Hypertensive chronic kidney disease with stage 1 through stage 4 chronic kidney disease, or unspecified chronic kidney disease: Secondary | ICD-10-CM | POA: Diagnosis not present

## 2017-01-14 DIAGNOSIS — J449 Chronic obstructive pulmonary disease, unspecified: Secondary | ICD-10-CM | POA: Diagnosis not present

## 2017-01-14 DIAGNOSIS — M6281 Muscle weakness (generalized): Secondary | ICD-10-CM | POA: Diagnosis not present

## 2017-01-14 DIAGNOSIS — R2689 Other abnormalities of gait and mobility: Secondary | ICD-10-CM | POA: Diagnosis not present

## 2017-01-14 DIAGNOSIS — N183 Chronic kidney disease, stage 3 (moderate): Secondary | ICD-10-CM | POA: Diagnosis not present

## 2017-01-14 DIAGNOSIS — I4891 Unspecified atrial fibrillation: Secondary | ICD-10-CM | POA: Diagnosis not present

## 2017-01-14 DIAGNOSIS — Z87440 Personal history of urinary (tract) infections: Secondary | ICD-10-CM | POA: Diagnosis not present

## 2017-01-14 DIAGNOSIS — F17200 Nicotine dependence, unspecified, uncomplicated: Secondary | ICD-10-CM | POA: Diagnosis not present

## 2017-01-14 DIAGNOSIS — K5900 Constipation, unspecified: Secondary | ICD-10-CM | POA: Diagnosis not present

## 2017-01-14 DIAGNOSIS — E119 Type 2 diabetes mellitus without complications: Secondary | ICD-10-CM | POA: Diagnosis not present

## 2017-01-17 ENCOUNTER — Other Ambulatory Visit: Payer: Self-pay | Admitting: *Deleted

## 2017-01-17 NOTE — Patient Outreach (Signed)
[facility]) Care Management  01/17/2017  [name] June 25, 1951 [id number]   CSW made a second attempt to try and contact patient today to follow-up regarding social work services and resources, without success.  A HIPAA compliant message was left on voicemail.  CSW is currently awaiting a return call.  If CSW does not receive a return call from patient within the next week, CSW will make a third and final outreach attempt to patient. Nat Christen, BSW, MSW, LCSW  Licensed Education officer, environmental [facility] System  Mailing [place] [address], [place], [place] [address] Physical Address-[address], [place], [place] [address] Toll Free Main # [phone] Fax # [phone] Cell # [phone]  Office # [phone] [email or web address]

## 2017-01-18 DIAGNOSIS — K5900 Constipation, unspecified: Secondary | ICD-10-CM | POA: Diagnosis not present

## 2017-01-18 DIAGNOSIS — R2689 Other abnormalities of gait and mobility: Secondary | ICD-10-CM | POA: Diagnosis not present

## 2017-01-18 DIAGNOSIS — F17200 Nicotine dependence, unspecified, uncomplicated: Secondary | ICD-10-CM | POA: Diagnosis not present

## 2017-01-18 DIAGNOSIS — Z87440 Personal history of urinary (tract) infections: Secondary | ICD-10-CM | POA: Diagnosis not present

## 2017-01-18 DIAGNOSIS — E119 Type 2 diabetes mellitus without complications: Secondary | ICD-10-CM | POA: Diagnosis not present

## 2017-01-18 DIAGNOSIS — J449 Chronic obstructive pulmonary disease, unspecified: Secondary | ICD-10-CM | POA: Diagnosis not present

## 2017-01-18 DIAGNOSIS — I4891 Unspecified atrial fibrillation: Secondary | ICD-10-CM | POA: Diagnosis not present

## 2017-01-18 DIAGNOSIS — I129 Hypertensive chronic kidney disease with stage 1 through stage 4 chronic kidney disease, or unspecified chronic kidney disease: Secondary | ICD-10-CM | POA: Diagnosis not present

## 2017-01-18 DIAGNOSIS — N183 Chronic kidney disease, stage 3 (moderate): Secondary | ICD-10-CM | POA: Diagnosis not present

## 2017-01-18 DIAGNOSIS — M6281 Muscle weakness (generalized): Secondary | ICD-10-CM | POA: Diagnosis not present

## 2017-01-21 DIAGNOSIS — K219 Gastro-esophageal reflux disease without esophagitis: Secondary | ICD-10-CM | POA: Diagnosis not present

## 2017-01-21 DIAGNOSIS — K5900 Constipation, unspecified: Secondary | ICD-10-CM | POA: Diagnosis not present

## 2017-01-21 DIAGNOSIS — L439 Lichen planus, unspecified: Secondary | ICD-10-CM | POA: Diagnosis not present

## 2017-01-21 DIAGNOSIS — I10 Essential (primary) hypertension: Secondary | ICD-10-CM | POA: Diagnosis not present

## 2017-01-24 ENCOUNTER — Encounter: Payer: Self-pay | Admitting: *Deleted

## 2017-01-24 ENCOUNTER — Other Ambulatory Visit: Payer: Self-pay | Admitting: *Deleted

## 2017-01-24 NOTE — Patient Outreach (Signed)
[facility]) Care Management  01/24/2017  [name] 1951/04/18 [id number]   CSW made a third and final attempt to try and contact patient today to perform phone assessment, as well as assess and assist with social work needs and services, without success.  A HIPAA compliant message was left for patient on voicemail.  CSW  continues to await a return call.  CSW will mail an outreach letter to patient's home, encouraging patient to contact CSW at their earliest convenience, if patient is interested in receiving social work services through [place] with [facility].  If CSW does not receive a return call from patient within the next 10 business days, CSW will proceed with case closure.  Required number of phone attempts will have been made and outreach letter mailed.   Nat Christen, BSW, MSW, LCSW  Licensed Education officer, environmental Health System  Mailing [place] [address] [place], [place] [address] Physical Address-[address], [place], [place] [address] Toll Free Main # [phone] Fax # [phone] Cell # [phone]  Office # [phone] [email or web address]

## 2017-01-26 ENCOUNTER — Encounter (HOSPITAL_COMMUNITY): Payer: Self-pay | Admitting: *Deleted

## 2017-01-26 ENCOUNTER — Emergency Department (HOSPITAL_COMMUNITY)
Admission: EM | Admit: 2017-01-26 | Discharge: 2017-01-27 | Disposition: A | Discharge status: Home / Self Care | Payer: PPO | Attending: Emergency Medicine | Admitting: Emergency Medicine

## 2017-01-26 ENCOUNTER — Emergency Department (HOSPITAL_COMMUNITY): Payer: PPO

## 2017-01-26 DIAGNOSIS — R1030 Lower abdominal pain, unspecified: Secondary | ICD-10-CM | POA: Diagnosis not present

## 2017-01-26 DIAGNOSIS — R109 Unspecified abdominal pain: Secondary | ICD-10-CM | POA: Diagnosis not present

## 2017-01-26 DIAGNOSIS — N183 Chronic kidney disease, stage 3 (moderate): Secondary | ICD-10-CM | POA: Diagnosis not present

## 2017-01-26 DIAGNOSIS — I129 Hypertensive chronic kidney disease with stage 1 through stage 4 chronic kidney disease, or unspecified chronic kidney disease: Secondary | ICD-10-CM | POA: Insufficient documentation

## 2017-01-26 DIAGNOSIS — E1122 Type 2 diabetes mellitus with diabetic chronic kidney disease: Secondary | ICD-10-CM | POA: Insufficient documentation

## 2017-01-26 DIAGNOSIS — N132 Hydronephrosis with renal and ureteral calculous obstruction: Secondary | ICD-10-CM | POA: Diagnosis not present

## 2017-01-26 DIAGNOSIS — Z7982 Long term (current) use of aspirin: Secondary | ICD-10-CM | POA: Insufficient documentation

## 2017-01-26 DIAGNOSIS — Z79899 Other long term (current) drug therapy: Secondary | ICD-10-CM | POA: Insufficient documentation

## 2017-01-26 DIAGNOSIS — N23 Unspecified renal colic: Secondary | ICD-10-CM

## 2017-01-26 DIAGNOSIS — Z87891 Personal history of nicotine dependence: Secondary | ICD-10-CM | POA: Insufficient documentation

## 2017-01-26 LAB — URINALYSIS, ROUTINE W REFLEX MICROSCOPIC
Bilirubin Urine: NEGATIVE
Glucose, UA: 50 mg/dL — AB
Hgb urine dipstick: NEGATIVE
KETONES UR: 5 mg/dL — AB
LEUKOCYTES UA: NEGATIVE
NITRITE: NEGATIVE
PH: 7 (ref 5.0–8.0)
Protein, ur: NEGATIVE mg/dL
SPECIFIC GRAVITY, URINE: 1.011 (ref 1.005–1.030)

## 2017-01-26 LAB — CBC WITH DIFFERENTIAL/PLATELET
BASOS ABS: 0 10*3/uL (ref 0.0–0.1)
Basophils Relative: 0 %
Eosinophils Absolute: 0 10*3/uL (ref 0.0–0.7)
Eosinophils Relative: 0 %
HEMATOCRIT: 33.3 % — AB (ref 39.0–52.0)
HEMOGLOBIN: 11.3 g/dL — AB (ref 13.0–17.0)
Lymphocytes Relative: 2 %
Lymphs Abs: 0.2 10*3/uL — ABNORMAL LOW (ref 0.7–4.0)
MCH: 35.2 pg — ABNORMAL HIGH (ref 26.0–34.0)
MCHC: 33.9 g/dL (ref 30.0–36.0)
MCV: 103.7 fL — ABNORMAL HIGH (ref 78.0–100.0)
Monocytes Absolute: 0.1 10*3/uL (ref 0.1–1.0)
Monocytes Relative: 1 %
NEUTROS ABS: 9.9 10*3/uL — AB (ref 1.7–7.7)
NEUTROS PCT: 97 %
Platelets: 150 10*3/uL (ref 150–400)
RBC: 3.21 MIL/uL — AB (ref 4.22–5.81)
RDW: 13.6 % (ref 11.5–15.5)
WBC: 10.3 10*3/uL (ref 4.0–10.5)

## 2017-01-26 LAB — COMPREHENSIVE METABOLIC PANEL
ALK PHOS: 59 U/L (ref 38–126)
ALT: 13 U/L — ABNORMAL LOW (ref 17–63)
ANION GAP: 11 (ref 5–15)
AST: 20 U/L (ref 15–41)
Albumin: 3.6 g/dL (ref 3.5–5.0)
BILIRUBIN TOTAL: 1 mg/dL (ref 0.3–1.2)
BUN: 17 mg/dL (ref 6–20)
CALCIUM: 8.9 mg/dL (ref 8.9–10.3)
CO2: 23 mmol/L (ref 22–32)
Chloride: 103 mmol/L (ref 101–111)
Creatinine, Ser: 1.25 mg/dL — ABNORMAL HIGH (ref 0.61–1.24)
GFR calc non Af Amer: 58 mL/min — ABNORMAL LOW (ref >60)
Glucose, Bld: 139 mg/dL — ABNORMAL HIGH (ref 65–99)
Potassium: 3.9 mmol/L (ref 3.5–5.1)
Sodium: 137 mmol/L (ref 135–145)
TOTAL PROTEIN: 6.5 g/dL (ref 6.5–8.1)

## 2017-01-26 LAB — I-STAT CG4 LACTIC ACID, ED: Lactic Acid, Venous: 1.97 mmol/L (ref 0.5–1.9)

## 2017-01-26 MED ORDER — ONDANSETRON 8 MG PO TBDP: 8.0000 mg | ORAL_TABLET | Freq: Three times a day (TID) | ORAL | 0 refills | Status: DC | PRN

## 2017-01-26 MED ORDER — HYDROCODONE-ACETAMINOPHEN 5-325 MG PO TABS: 1.0000 | ORAL_TABLET | Freq: Four times a day (QID) | ORAL | 0 refills | Status: DC | PRN

## 2017-01-26 MED ORDER — IOPAMIDOL (ISOVUE-300) INJECTION 61%
100.0000 mL | Freq: Once | INTRAVENOUS | Status: AC | PRN
  Administered 2017-01-26: 100 mL via INTRAVENOUS

## 2017-01-26 MED ORDER — KETOROLAC TROMETHAMINE 15 MG/ML IJ SOLN
15.0000 mg | Freq: Once | INTRAMUSCULAR | Status: AC
  Administered 2017-01-26: 15 mg via INTRAVENOUS
  Filled 2017-01-26: qty 1

## 2017-01-26 MED ORDER — IBUPROFEN 400 MG PO TABS: 400.0000 mg | ORAL_TABLET | Freq: Four times a day (QID) | ORAL | 0 refills | Status: DC | PRN

## 2017-01-26 NOTE — ED Triage Notes (Signed)
Per EMS, pt from [facility] complains of lower abdominal pain, decreased urination and increased frequency x 4 hours. Pt has hx of constipation.  BP 140/94 HR 110 O2 100% CBG 128

## 2017-01-26 NOTE — ED Provider Notes (Signed)
[facility] DEPT Provider Note   CSN: [id number] Arrival date & time: 01/26/17  1753     History   Chief Complaint Chief Complaint  Patient presents with  . Abdominal Pain    HPI [name] is a 66 y.o. male.  HPI Pt comes in with cc of abd pain, urinary frequency. PT has hx of DM, HTN, HL, PAF He reports that after 1 pm today he started having lower quadrant abd pain and fevers. Pt has no pain with urination, or blood in the urine. He has hx of BPH. Pt's abd pain is constant, and waxing and waning in intensity. No n/v/chills/diarrhea.  Past Medical History:  Diagnosis Date  . Atrial fibrillation ([place])   . Cataract   . Diabetes mellitus without complication ([place])   . Hyperlipidemia   . Hypertension   . Lichen planus    bilateral legs    Patient Active Problem List   Diagnosis Date Noted  . Adenocarcinoma of colon ([place])   . Rectal pain 12/04/2016  . 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 Polyp of cecum 11/17/2016  . Abnormal CT scan, colon   . Benign neoplasm of cecum   . AKI (acute kidney injury) ([place])   . Generalized weakness   . Fecal impaction ([place])   . Abdominal pain   . Impaction of colon ([place])   . Protein-calorie malnutrition, severe 11/10/2016  . Constipation 11/09/2016  . Urinary tract infection without hematuria 11/09/2016  . Hypokalemia 11/09/2016  . CKD (chronic kidney disease) stage 3, GFR 30-59 ml/min 11/09/2016  . Hyperbilirubinemia 11/09/2016  . Homelessness 11/09/2016  . Hyponatremia 11/08/2016  . RBBB 09/13/2013  . Snoring 09/13/2013  . Edema 09/07/2013  . Diabetes mellitus type 2 in obese ([place]) 03/16/2013  . Arthritis 03/16/2013  . Essential hypertension, benign 02/23/2013  . Morbid obesity ([place]) 02/23/2013  . Atrial fibrillation ([place]) 02/23/2013  . Other and unspecified hyperlipidemia 02/23/2013    Past Surgical History:  Procedure Laterality Date  . APPENDECTOMY    . COLON RESECTION N/A 11/22/2016     Procedure: HAND ASSISTED LAPAROSCOPIC COLON RESECTION;  Surgeon: [name], MD;  Location: [facility];  Service: General;  Laterality: N/A;  . COLONOSCOPY N/A 11/17/2016   Procedure: COLONOSCOPY;  Surgeon: [name], MD;  Location: [facility];  Service: Endoscopy;  Laterality: N/A;  . FRACTURE SURGERY    . MULTIPLE TOOTH EXTRACTIONS    . RECTAL EXAM UNDER ANESTHESIA N/A 12/05/2016   Procedure: RECTAL EXAM UNDER ANESTHESIA, DISIMPACTION;  Surgeon: Alphonsa Overall, MD;  Location: [facility];  Service: General;  Laterality: N/A;       Home Medications    Prior to Admission medications   Medication Sig Start Date End Date Taking? Authorizing Provider  acetaminophen (TYLENOL) 325 MG tablet Take 2 tablets (650 mg total) by mouth every 6 (six) hours as needed (for pain and/or Fever >/= 101). 12/08/16  Yes Barton Dubois, MD  aspirin EC 81 MG EC tablet Take 1 tablet (81 mg total) by mouth daily. 11/27/16  Yes Barton Dubois, MD  busPIRone (BUSPAR) 7.5 MG tablet Take 1 tablet (7.5 mg total) by mouth 3 (three) times daily. 11/26/16  Yes Barton Dubois, MD  digoxin (LANOXIN) 0.125 MG tablet Take 1 tablet (0.125 mg total) by mouth daily. 11/27/16  Yes Barton Dubois, MD  docusate sodium (COLACE) 100 MG capsule Take 1 capsule (100 mg total) by mouth 2 (two) times daily. 12/08/16  Yes Barton Dubois, MD  hydrocortisone (  ANUSOL-HC) 2.5 % rectal cream Place rectally every 12 (twelve) hours as needed for hemorrhoids or itching. 11/26/16  Yes Barton Dubois, MD  liver oil-zinc oxide (DESITIN) 40 % ointment Apply topically as needed for irritation. 11/26/16  Yes Barton Dubois, MD  Metoprolol Tartrate 75 MG TABS Take 75 mg by mouth 2 (two) times daily. 11/26/16  Yes Barton Dubois, MD  Multiple Vitamin (MULTIVITAMIN) capsule Take 1 capsule by mouth daily.   Yes Historical Provider, MD  polyethylene glycol (MIRALAX) packet Take 17 g by mouth daily. Hold for diarrhea 12/08/16  Yes Barton Dubois, MD  ranitidine  (ZANTAC) 150 MG capsule Take 150 mg by mouth daily as needed for heartburn.    Yes Historical Provider, MD  tamsulosin (FLOMAX) 0.4 MG CAPS capsule Take 1 capsule (0.4 mg total) by mouth daily. 11/27/16  Yes Barton Dubois, MD  HYDROcodone-acetaminophen (NORCO/VICODIN) 5-325 MG tablet Take 1 tablet by mouth every 6 (six) hours as needed. 01/26/17   Varney Biles, MD  ibuprofen (ADVIL,MOTRIN) 400 MG tablet Take 1 tablet (400 mg total) by mouth every 6 (six) hours as needed. 01/26/17   Varney Biles, MD  ondansetron (ZOFRAN ODT) 8 MG disintegrating tablet Take 1 tablet (8 mg total) by mouth every 8 (eight) hours as needed for nausea. 01/26/17   Varney Biles, MD  UNABLE TO FIND Med Name: House 2.0 take 237 mL four times daily    Historical Provider, MD    Family History Family History  Problem Relation Age of Onset  . Brain cancer Mother   . Alzheimer's disease Father   . Cancer Maternal Grandmother   . Heart attack Maternal Grandfather   . Pneumonia Paternal Grandfather     Social History Social History  Substance Use Topics  . Smoking status: Former Smoker    Types: Pipe    Quit date: 09/14/1983  . Smokeless tobacco: Never Used  . Alcohol use No     Allergies   Other   Review of Systems Review of Systems  Constitutional: Negative for fever.  Gastrointestinal: Positive for abdominal pain.  Genitourinary: Positive for frequency.   ROS 10 Systems reviewed and are negative for acute change except as noted in the HPI.      Physical Exam Updated Vital Signs BP 155/79   Pulse 116   Temp 98.4 F (36.9 C) (Oral)   Resp 18   Ht 6\' 3"  (1.905 m)   Wt 220 lb (99.8 kg)   SpO2 100%   BMI 27.50 kg/m   Physical Exam  Constitutional: He is oriented to person, place, and time. He appears well-developed.  HENT:  Head: Normocephalic and atraumatic.  Eyes: Conjunctivae and EOM are normal. Pupils are equal, round, and reactive to light.  Neck: Normal range of motion. Neck  supple.  Cardiovascular: Normal rate and regular rhythm.   Pulmonary/Chest: Effort normal and breath sounds normal.  Abdominal: Soft. Bowel sounds are normal. He exhibits no distension and no mass. There is tenderness. There is no rebound and no guarding.  Lower quadrant tenderness  Musculoskeletal: He exhibits no deformity.  Neurological: He is alert and oriented to person, place, and time.  Skin: Skin is warm.  Nursing note and vitals reviewed.    ED Treatments / Results  Labs (all labs ordered are listed, but only abnormal results are displayed) Labs Reviewed  COMPREHENSIVE METABOLIC PANEL - Abnormal; Notable for the following:       Result Value   Glucose, Bld 139 (*)  Creatinine, Ser 1.25 (*)    ALT 13 (*)    GFR calc non Af Amer 58 (*)    All other components within normal limits  CBC WITH DIFFERENTIAL/PLATELET - Abnormal; Notable for the following:    RBC 3.21 (*)    Hemoglobin 11.3 (*)    HCT 33.3 (*)    MCV 103.7 (*)    MCH 35.2 (*)    Neutro Abs 9.9 (*)    Lymphs Abs 0.2 (*)    All other components within normal limits  URINALYSIS, ROUTINE W REFLEX MICROSCOPIC - Abnormal; Notable for the following:    Color, Urine STRAW (*)    Glucose, UA 50 (*)    Ketones, ur 5 (*)    All other components within normal limits  I-STAT CG4 LACTIC ACID, ED - Abnormal; Notable for the following:    Lactic Acid, Venous 1.97 (*)    All other components within normal limits  I-STAT CG4 LACTIC ACID, ED    EKG  EKG Interpretation None       Radiology Ct Abdomen Pelvis W Contrast  Result Date: 01/26/2017 CLINICAL DATA:  Lower abdominal pain with decreased urination but increased frequency of urination. EXAM: CT ABDOMEN AND PELVIS WITH CONTRAST TECHNIQUE: Multidetector CT imaging of the abdomen and pelvis was performed using the standard protocol following bolus administration of intravenous contrast. CONTRAST:  136mL ISOVUE-300 IOPAMIDOL (ISOVUE-300) INJECTION 61% COMPARISON:   12/04/2016 CT FINDINGS: Lower chest: Normal size cardiac chambers. Bibasilar atelectasis. No pulmonary consolidation, significant effusion or pneumothorax. Hepatobiliary: No focal liver abnormality is seen. No gallstones, gallbladder wall thickening, or biliary dilatation. Pancreas: Unremarkable. No pancreatic ductal dilatation or surrounding inflammatory changes. Spleen: Several hypoattenuating stable foci within the normal-sized spleen. These are unchanged the nonspecific in etiology possibly representing small hematopoetic rests or hemangiomata among some possibilities. Adrenals/Urinary Tract: Mild thickening of both adrenal glands unchanged in appearance. Delayed left-sided nephrogram with left-sided mild-to-moderate hydroureteronephrosis attributable to a 3 x 1 mm left ureterovesical junction stone. The bladder is nondistended. Stomach/Bowel: Intact large bowel anastomosis in the right lower quadrant. No bowel obstruction or acute inflammation. Normal small bowel rotation is noted. There is sigmoid diverticulosis without acute diverticulitis. Chronic mild perirectal thickening/inflammation surrounding a smaller volume of retained stool within the rectal vault. Vascular/Lymphatic: Aortic atherosclerosis. No enlarged abdominal or pelvic lymph nodes. No aortic aneurysm. Reproductive: Peripheral zone calcifications noted of the normal sized prostate. Other: Sagittal infraumbilical scar. No hernia. No abdominopelvic ascites. Musculoskeletal: Bilateral sacroiliac bridging osteophytes. Multilevel lumbar facet arthrosis and osteophytosis consistent with spondylosis. IMPRESSION: New left mild-to-moderate hydroureteronephrosis attributable to a 3 x 1 mm left ureterovesical junction stone. Otherwise stable exam. Electronically Signed   By: Ashley Royalty M.D.   On: 01/26/2017 22:21    Procedures Procedures (including critical care time)  Angiocath insertion Performed by: Varney Biles  Consent: Verbal consent  obtained. Risks and benefits: risks, benefits and alternatives were discussed Time out: Immediately prior to procedure a "time out" was called to verify the correct patient, procedure, equipment, support staff and site/side marked as required.  Preparation: Patient was prepped and draped in the usual sterile fashion.  Vein Location: Right antecubital fossa  Ultrasound Guided: yes Gauge: 20  Normal blood return and flush without difficulty Patient tolerance: Patient tolerated the procedure well with no immediate complications.     Medications Ordered in ED Medications  ketorolac (TORADOL) 15 MG/ML injection 15 mg (not administered)  iopamidol (ISOVUE-300) 61 % injection 100 mL (100 mLs Intravenous  Contrast Given 01/26/17 2150)     Initial Impression / Assessment and Plan / ED Course  I have reviewed the triage vital signs and the nursing notes.  Pertinent labs & imaging results that were available during my care of the patient were reviewed by me and considered in my medical decision making (see chart for details).  Clinical Course as of Jan 26 2307  Wed Jan 26, 2017  2025 UA is clean - pt still having abd pain. Due to his hx of DM, AF and as pt had fevers  - we will get CT abd. If neg, we will d/c.  [AN]  2307 Results from the ER workup discussed with the patient face to face and all questions answered to the best of my ability.  CT ABDOMEN PELVIS W CONTRAST [AN]    Clinical Course User Index [AN] Varney Biles, MD   Pt comes in with cc of abd pain, urinary urgency. Has hx of prostate enlargement and multiple medical problems. Pt has lower quadrant abd tenderness. We will get UA first - if UA is negative, then we will reassess and consider CT scan.   Final Clinical Impressions(s) / ED Diagnoses   Final diagnoses:  Ureteral colic    New Prescriptions New Prescriptions   HYDROCODONE-ACETAMINOPHEN (NORCO/VICODIN) 5-325 MG TABLET    Take 1 tablet by mouth every 6 (six)  hours as needed.   IBUPROFEN (ADVIL,MOTRIN) 400 MG TABLET    Take 1 tablet (400 mg total) by mouth every 6 (six) hours as needed.   ONDANSETRON (ZOFRAN ODT) 8 MG DISINTEGRATING TABLET    Take 1 tablet (8 mg total) by mouth every 8 (eight) hours as needed for nausea.     Varney Biles, MD 01/26/17 2308

## 2017-01-26 NOTE — Discharge Instructions (Signed)
We saw you in the ER for the abdominal pain. °Our results indicate that you have a kidney stone. °We were able to get your pain is relative control, and we can safely send you home. ° °Take the meds prescribed. °Set up an appointment with the Urologist. °If the pain is unbearable, you start having fevers, chills, and are unable to keep any meds down - then return to the ER. °  °

## 2017-01-26 NOTE — ED Notes (Signed)
[facility] called for transport back [facility].

## 2017-01-26 NOTE — ED Notes (Signed)
Delay in obtaining blood. Pt stuck multiple times unsuccessfully. Dr. Kathrynn Humble made aware.

## 2017-01-27 DIAGNOSIS — R631 Polydipsia: Secondary | ICD-10-CM | POA: Diagnosis not present

## 2017-01-27 DIAGNOSIS — R100 Acute abdomen: Secondary | ICD-10-CM | POA: Diagnosis not present

## 2017-01-27 DIAGNOSIS — N132 Hydronephrosis with renal and ureteral calculous obstruction: Secondary | ICD-10-CM | POA: Diagnosis not present

## 2017-01-27 LAB — CBG MONITORING, ED: GLUCOSE-CAPILLARY: 139 mg/dL — AB (ref 65–99)

## 2017-01-27 NOTE — ED Notes (Signed)
[facility] here to transport pt back to [facility]. 

## 2017-01-28 DIAGNOSIS — I10 Essential (primary) hypertension: Secondary | ICD-10-CM | POA: Diagnosis not present

## 2017-01-28 DIAGNOSIS — I509 Heart failure, unspecified: Secondary | ICD-10-CM | POA: Diagnosis not present

## 2017-01-28 DIAGNOSIS — K219 Gastro-esophageal reflux disease without esophagitis: Secondary | ICD-10-CM | POA: Diagnosis not present

## 2017-01-28 DIAGNOSIS — N200 Calculus of kidney: Secondary | ICD-10-CM | POA: Diagnosis not present

## 2017-01-31 DIAGNOSIS — M6281 Muscle weakness (generalized): Secondary | ICD-10-CM | POA: Diagnosis not present

## 2017-01-31 DIAGNOSIS — E119 Type 2 diabetes mellitus without complications: Secondary | ICD-10-CM | POA: Diagnosis not present

## 2017-01-31 DIAGNOSIS — R2689 Other abnormalities of gait and mobility: Secondary | ICD-10-CM | POA: Diagnosis not present

## 2017-01-31 DIAGNOSIS — I4891 Unspecified atrial fibrillation: Secondary | ICD-10-CM | POA: Diagnosis not present

## 2017-01-31 DIAGNOSIS — J449 Chronic obstructive pulmonary disease, unspecified: Secondary | ICD-10-CM | POA: Diagnosis not present

## 2017-01-31 DIAGNOSIS — I129 Hypertensive chronic kidney disease with stage 1 through stage 4 chronic kidney disease, or unspecified chronic kidney disease: Secondary | ICD-10-CM | POA: Diagnosis not present

## 2017-01-31 DIAGNOSIS — K5900 Constipation, unspecified: Secondary | ICD-10-CM | POA: Diagnosis not present

## 2017-01-31 DIAGNOSIS — Z87440 Personal history of urinary (tract) infections: Secondary | ICD-10-CM | POA: Diagnosis not present

## 2017-01-31 DIAGNOSIS — F17200 Nicotine dependence, unspecified, uncomplicated: Secondary | ICD-10-CM | POA: Diagnosis not present

## 2017-01-31 DIAGNOSIS — N183 Chronic kidney disease, stage 3 (moderate): Secondary | ICD-10-CM | POA: Diagnosis not present

## 2017-02-02 DIAGNOSIS — N183 Chronic kidney disease, stage 3 (moderate): Secondary | ICD-10-CM | POA: Diagnosis not present

## 2017-02-02 DIAGNOSIS — J449 Chronic obstructive pulmonary disease, unspecified: Secondary | ICD-10-CM | POA: Diagnosis not present

## 2017-02-02 DIAGNOSIS — M6281 Muscle weakness (generalized): Secondary | ICD-10-CM | POA: Diagnosis not present

## 2017-02-02 DIAGNOSIS — I4891 Unspecified atrial fibrillation: Secondary | ICD-10-CM | POA: Diagnosis not present

## 2017-02-02 DIAGNOSIS — F17200 Nicotine dependence, unspecified, uncomplicated: Secondary | ICD-10-CM | POA: Diagnosis not present

## 2017-02-02 DIAGNOSIS — I129 Hypertensive chronic kidney disease with stage 1 through stage 4 chronic kidney disease, or unspecified chronic kidney disease: Secondary | ICD-10-CM | POA: Diagnosis not present

## 2017-02-02 DIAGNOSIS — E119 Type 2 diabetes mellitus without complications: Secondary | ICD-10-CM | POA: Diagnosis not present

## 2017-02-02 DIAGNOSIS — R2689 Other abnormalities of gait and mobility: Secondary | ICD-10-CM | POA: Diagnosis not present

## 2017-02-02 DIAGNOSIS — Z87440 Personal history of urinary (tract) infections: Secondary | ICD-10-CM | POA: Diagnosis not present

## 2017-02-02 DIAGNOSIS — K5900 Constipation, unspecified: Secondary | ICD-10-CM | POA: Diagnosis not present

## 2017-02-07 ENCOUNTER — Other Ambulatory Visit: Payer: Self-pay | Admitting: *Deleted

## 2017-02-07 NOTE — Patient Outreach (Signed)
[facility]) Care Management  02/07/2017  Dari Kamran 1951-09-05 [id number]   CSW will perform a case closure on patient, due to inability to establish initial phone contact with patient, despite required number of phone attempts made and outreach letter mailed to patient's home.  CSW will fax an update to patient's Primary Care Physician, Dr. Sandi Mariscal to ensure that they are aware of CSW's involvement with patient's plan of care.  CSW will submit a case closure request to Josepha Pigg, Care Management Assistant with [facility], in the form of an [employer].   Nat Christen, BSW, MSW, LCSW  Licensed Education officer, [facility]  Mailing [place] [address] [place], [place] [address] Physical Address-[address], [place], [place] [address] Toll Free Main # [phone] Fax # [phone] Cell # [phone]  Office # [phone] [email or web address]

## 2017-02-11 DIAGNOSIS — R2689 Other abnormalities of gait and mobility: Secondary | ICD-10-CM | POA: Diagnosis not present

## 2017-02-11 DIAGNOSIS — J449 Chronic obstructive pulmonary disease, unspecified: Secondary | ICD-10-CM | POA: Diagnosis not present

## 2017-02-11 DIAGNOSIS — K5900 Constipation, unspecified: Secondary | ICD-10-CM | POA: Diagnosis not present

## 2017-02-11 DIAGNOSIS — F17200 Nicotine dependence, unspecified, uncomplicated: Secondary | ICD-10-CM | POA: Diagnosis not present

## 2017-02-11 DIAGNOSIS — M6281 Muscle weakness (generalized): Secondary | ICD-10-CM | POA: Diagnosis not present

## 2017-02-11 DIAGNOSIS — E119 Type 2 diabetes mellitus without complications: Secondary | ICD-10-CM | POA: Diagnosis not present

## 2017-02-11 DIAGNOSIS — N183 Chronic kidney disease, stage 3 (moderate): Secondary | ICD-10-CM | POA: Diagnosis not present

## 2017-02-11 DIAGNOSIS — I129 Hypertensive chronic kidney disease with stage 1 through stage 4 chronic kidney disease, or unspecified chronic kidney disease: Secondary | ICD-10-CM | POA: Diagnosis not present

## 2017-02-11 DIAGNOSIS — I4891 Unspecified atrial fibrillation: Secondary | ICD-10-CM | POA: Diagnosis not present

## 2017-02-11 DIAGNOSIS — Z87440 Personal history of urinary (tract) infections: Secondary | ICD-10-CM | POA: Diagnosis not present

## 2017-02-16 DIAGNOSIS — F17200 Nicotine dependence, unspecified, uncomplicated: Secondary | ICD-10-CM | POA: Diagnosis not present

## 2017-02-16 DIAGNOSIS — R2689 Other abnormalities of gait and mobility: Secondary | ICD-10-CM | POA: Diagnosis not present

## 2017-02-16 DIAGNOSIS — K5900 Constipation, unspecified: Secondary | ICD-10-CM | POA: Diagnosis not present

## 2017-02-16 DIAGNOSIS — I129 Hypertensive chronic kidney disease with stage 1 through stage 4 chronic kidney disease, or unspecified chronic kidney disease: Secondary | ICD-10-CM | POA: Diagnosis not present

## 2017-02-16 DIAGNOSIS — Z87440 Personal history of urinary (tract) infections: Secondary | ICD-10-CM | POA: Diagnosis not present

## 2017-02-16 DIAGNOSIS — N183 Chronic kidney disease, stage 3 (moderate): Secondary | ICD-10-CM | POA: Diagnosis not present

## 2017-02-16 DIAGNOSIS — E119 Type 2 diabetes mellitus without complications: Secondary | ICD-10-CM | POA: Diagnosis not present

## 2017-02-16 DIAGNOSIS — J449 Chronic obstructive pulmonary disease, unspecified: Secondary | ICD-10-CM | POA: Diagnosis not present

## 2017-02-16 DIAGNOSIS — M6281 Muscle weakness (generalized): Secondary | ICD-10-CM | POA: Diagnosis not present

## 2017-02-16 DIAGNOSIS — I4891 Unspecified atrial fibrillation: Secondary | ICD-10-CM | POA: Diagnosis not present

## 2017-02-25 DIAGNOSIS — I10 Essential (primary) hypertension: Secondary | ICD-10-CM | POA: Diagnosis not present

## 2017-02-25 DIAGNOSIS — K5900 Constipation, unspecified: Secondary | ICD-10-CM | POA: Diagnosis not present

## 2017-02-25 DIAGNOSIS — I509 Heart failure, unspecified: Secondary | ICD-10-CM | POA: Diagnosis not present

## 2017-02-25 DIAGNOSIS — I482 Chronic atrial fibrillation: Secondary | ICD-10-CM | POA: Diagnosis not present

## 2017-04-01 DIAGNOSIS — K5900 Constipation, unspecified: Secondary | ICD-10-CM | POA: Diagnosis not present

## 2017-04-01 DIAGNOSIS — K219 Gastro-esophageal reflux disease without esophagitis: Secondary | ICD-10-CM | POA: Diagnosis not present

## 2017-04-01 DIAGNOSIS — I509 Heart failure, unspecified: Secondary | ICD-10-CM | POA: Diagnosis not present

## 2017-04-01 DIAGNOSIS — I10 Essential (primary) hypertension: Secondary | ICD-10-CM | POA: Diagnosis not present

## 2017-05-06 DIAGNOSIS — I509 Heart failure, unspecified: Secondary | ICD-10-CM | POA: Diagnosis not present

## 2017-05-06 DIAGNOSIS — N400 Enlarged prostate without lower urinary tract symptoms: Secondary | ICD-10-CM | POA: Diagnosis not present

## 2017-05-06 DIAGNOSIS — I482 Chronic atrial fibrillation: Secondary | ICD-10-CM | POA: Diagnosis not present

## 2017-05-06 DIAGNOSIS — I10 Essential (primary) hypertension: Secondary | ICD-10-CM | POA: Diagnosis not present

## 2017-06-17 DIAGNOSIS — I10 Essential (primary) hypertension: Secondary | ICD-10-CM | POA: Diagnosis not present

## 2017-06-17 DIAGNOSIS — I509 Heart failure, unspecified: Secondary | ICD-10-CM | POA: Diagnosis not present

## 2017-06-17 DIAGNOSIS — K219 Gastro-esophageal reflux disease without esophagitis: Secondary | ICD-10-CM | POA: Diagnosis not present

## 2017-06-17 DIAGNOSIS — K5900 Constipation, unspecified: Secondary | ICD-10-CM | POA: Diagnosis not present

## 2017-07-25 DIAGNOSIS — F329 Major depressive disorder, single episode, unspecified: Secondary | ICD-10-CM | POA: Diagnosis not present

## 2017-07-25 DIAGNOSIS — F419 Anxiety disorder, unspecified: Secondary | ICD-10-CM | POA: Diagnosis not present

## 2017-08-18 DIAGNOSIS — K219 Gastro-esophageal reflux disease without esophagitis: Secondary | ICD-10-CM | POA: Diagnosis not present

## 2017-08-18 DIAGNOSIS — I10 Essential (primary) hypertension: Secondary | ICD-10-CM | POA: Diagnosis not present

## 2017-08-18 DIAGNOSIS — I482 Chronic atrial fibrillation: Secondary | ICD-10-CM | POA: Diagnosis not present

## 2017-08-18 DIAGNOSIS — I509 Heart failure, unspecified: Secondary | ICD-10-CM | POA: Diagnosis not present

## 2017-09-02 DIAGNOSIS — F329 Major depressive disorder, single episode, unspecified: Secondary | ICD-10-CM | POA: Diagnosis not present

## 2017-09-02 DIAGNOSIS — F419 Anxiety disorder, unspecified: Secondary | ICD-10-CM | POA: Diagnosis not present

## 2017-09-15 DIAGNOSIS — I509 Heart failure, unspecified: Secondary | ICD-10-CM | POA: Diagnosis not present

## 2017-09-15 DIAGNOSIS — N400 Enlarged prostate without lower urinary tract symptoms: Secondary | ICD-10-CM | POA: Diagnosis not present

## 2017-09-15 DIAGNOSIS — I10 Essential (primary) hypertension: Secondary | ICD-10-CM | POA: Diagnosis not present

## 2017-09-15 DIAGNOSIS — I482 Chronic atrial fibrillation: Secondary | ICD-10-CM | POA: Diagnosis not present

## 2017-09-30 DIAGNOSIS — F329 Major depressive disorder, single episode, unspecified: Secondary | ICD-10-CM | POA: Diagnosis not present

## 2017-09-30 DIAGNOSIS — F419 Anxiety disorder, unspecified: Secondary | ICD-10-CM | POA: Diagnosis not present

## 2017-10-10 DIAGNOSIS — I10 Essential (primary) hypertension: Secondary | ICD-10-CM | POA: Diagnosis not present

## 2017-10-10 DIAGNOSIS — K5900 Constipation, unspecified: Secondary | ICD-10-CM | POA: Diagnosis not present

## 2017-10-10 DIAGNOSIS — I482 Chronic atrial fibrillation: Secondary | ICD-10-CM | POA: Diagnosis not present

## 2017-10-10 DIAGNOSIS — I509 Heart failure, unspecified: Secondary | ICD-10-CM | POA: Diagnosis not present

## 2017-11-04 DIAGNOSIS — F419 Anxiety disorder, unspecified: Secondary | ICD-10-CM | POA: Diagnosis not present

## 2017-11-04 DIAGNOSIS — F329 Major depressive disorder, single episode, unspecified: Secondary | ICD-10-CM | POA: Diagnosis not present

## 2017-11-06 DIAGNOSIS — I509 Heart failure, unspecified: Secondary | ICD-10-CM | POA: Diagnosis not present

## 2017-11-06 DIAGNOSIS — K219 Gastro-esophageal reflux disease without esophagitis: Secondary | ICD-10-CM | POA: Diagnosis not present

## 2017-11-06 DIAGNOSIS — I482 Chronic atrial fibrillation: Secondary | ICD-10-CM | POA: Diagnosis not present

## 2017-11-06 DIAGNOSIS — I10 Essential (primary) hypertension: Secondary | ICD-10-CM | POA: Diagnosis not present

## 2017-12-09 DIAGNOSIS — I10 Essential (primary) hypertension: Secondary | ICD-10-CM | POA: Diagnosis not present

## 2017-12-09 DIAGNOSIS — F419 Anxiety disorder, unspecified: Secondary | ICD-10-CM | POA: Diagnosis not present

## 2017-12-09 DIAGNOSIS — I509 Heart failure, unspecified: Secondary | ICD-10-CM | POA: Diagnosis not present

## 2017-12-09 DIAGNOSIS — F329 Major depressive disorder, single episode, unspecified: Secondary | ICD-10-CM | POA: Diagnosis not present

## 2017-12-09 DIAGNOSIS — N400 Enlarged prostate without lower urinary tract symptoms: Secondary | ICD-10-CM | POA: Diagnosis not present

## 2017-12-09 DIAGNOSIS — K219 Gastro-esophageal reflux disease without esophagitis: Secondary | ICD-10-CM | POA: Diagnosis not present

## 2018-01-06 DIAGNOSIS — F329 Major depressive disorder, single episode, unspecified: Secondary | ICD-10-CM | POA: Diagnosis not present

## 2018-01-06 DIAGNOSIS — F419 Anxiety disorder, unspecified: Secondary | ICD-10-CM | POA: Diagnosis not present

## 2018-01-07 DIAGNOSIS — I10 Essential (primary) hypertension: Secondary | ICD-10-CM | POA: Diagnosis not present

## 2018-01-07 DIAGNOSIS — Z9181 History of falling: Secondary | ICD-10-CM | POA: Diagnosis not present

## 2018-01-07 DIAGNOSIS — I482 Chronic atrial fibrillation: Secondary | ICD-10-CM | POA: Diagnosis not present

## 2018-01-07 DIAGNOSIS — I509 Heart failure, unspecified: Secondary | ICD-10-CM | POA: Diagnosis not present

## 2018-02-03 DIAGNOSIS — F329 Major depressive disorder, single episode, unspecified: Secondary | ICD-10-CM | POA: Diagnosis not present

## 2018-02-03 DIAGNOSIS — F419 Anxiety disorder, unspecified: Secondary | ICD-10-CM | POA: Diagnosis not present

## 2018-02-08 DIAGNOSIS — I482 Chronic atrial fibrillation: Secondary | ICD-10-CM | POA: Diagnosis not present

## 2018-02-08 DIAGNOSIS — J069 Acute upper respiratory infection, unspecified: Secondary | ICD-10-CM | POA: Diagnosis not present

## 2018-02-08 DIAGNOSIS — I10 Essential (primary) hypertension: Secondary | ICD-10-CM | POA: Diagnosis not present

## 2018-02-08 DIAGNOSIS — K219 Gastro-esophageal reflux disease without esophagitis: Secondary | ICD-10-CM | POA: Diagnosis not present

## 2018-03-03 DIAGNOSIS — F329 Major depressive disorder, single episode, unspecified: Secondary | ICD-10-CM | POA: Diagnosis not present

## 2018-03-03 DIAGNOSIS — F419 Anxiety disorder, unspecified: Secondary | ICD-10-CM | POA: Diagnosis not present

## 2018-03-09 DIAGNOSIS — N400 Enlarged prostate without lower urinary tract symptoms: Secondary | ICD-10-CM | POA: Diagnosis not present

## 2018-03-09 DIAGNOSIS — I482 Chronic atrial fibrillation: Secondary | ICD-10-CM | POA: Diagnosis not present

## 2018-03-09 DIAGNOSIS — I10 Essential (primary) hypertension: Secondary | ICD-10-CM | POA: Diagnosis not present

## 2018-03-09 DIAGNOSIS — I509 Heart failure, unspecified: Secondary | ICD-10-CM | POA: Diagnosis not present

## 2018-03-31 DIAGNOSIS — F329 Major depressive disorder, single episode, unspecified: Secondary | ICD-10-CM | POA: Diagnosis not present

## 2018-03-31 DIAGNOSIS — F419 Anxiety disorder, unspecified: Secondary | ICD-10-CM | POA: Diagnosis not present

## 2018-04-07 DIAGNOSIS — J449 Chronic obstructive pulmonary disease, unspecified: Secondary | ICD-10-CM | POA: Diagnosis not present

## 2018-04-07 DIAGNOSIS — R0602 Shortness of breath: Secondary | ICD-10-CM | POA: Diagnosis not present

## 2018-04-07 DIAGNOSIS — I10 Essential (primary) hypertension: Secondary | ICD-10-CM | POA: Diagnosis not present

## 2018-04-07 DIAGNOSIS — K219 Gastro-esophageal reflux disease without esophagitis: Secondary | ICD-10-CM | POA: Diagnosis not present

## 2018-04-07 DIAGNOSIS — I509 Heart failure, unspecified: Secondary | ICD-10-CM | POA: Diagnosis not present

## 2018-04-07 DIAGNOSIS — N400 Enlarged prostate without lower urinary tract symptoms: Secondary | ICD-10-CM | POA: Diagnosis not present

## 2018-05-04 DIAGNOSIS — K5900 Constipation, unspecified: Secondary | ICD-10-CM | POA: Diagnosis not present

## 2018-05-04 DIAGNOSIS — I482 Chronic atrial fibrillation: Secondary | ICD-10-CM | POA: Diagnosis not present

## 2018-05-04 DIAGNOSIS — I10 Essential (primary) hypertension: Secondary | ICD-10-CM | POA: Diagnosis not present

## 2018-05-04 DIAGNOSIS — I509 Heart failure, unspecified: Secondary | ICD-10-CM | POA: Diagnosis not present

## 2018-05-05 DIAGNOSIS — F419 Anxiety disorder, unspecified: Secondary | ICD-10-CM | POA: Diagnosis not present

## 2018-05-05 DIAGNOSIS — F329 Major depressive disorder, single episode, unspecified: Secondary | ICD-10-CM | POA: Diagnosis not present

## 2018-06-02 DIAGNOSIS — F419 Anxiety disorder, unspecified: Secondary | ICD-10-CM | POA: Diagnosis not present

## 2018-06-02 DIAGNOSIS — F329 Major depressive disorder, single episode, unspecified: Secondary | ICD-10-CM | POA: Diagnosis not present

## 2018-06-11 DIAGNOSIS — I509 Heart failure, unspecified: Secondary | ICD-10-CM | POA: Diagnosis not present

## 2018-06-11 DIAGNOSIS — I10 Essential (primary) hypertension: Secondary | ICD-10-CM | POA: Diagnosis not present

## 2018-06-11 DIAGNOSIS — I482 Chronic atrial fibrillation: Secondary | ICD-10-CM | POA: Diagnosis not present

## 2018-06-11 DIAGNOSIS — K5900 Constipation, unspecified: Secondary | ICD-10-CM | POA: Diagnosis not present

## 2018-07-07 DIAGNOSIS — F329 Major depressive disorder, single episode, unspecified: Secondary | ICD-10-CM | POA: Diagnosis not present

## 2018-07-07 DIAGNOSIS — F419 Anxiety disorder, unspecified: Secondary | ICD-10-CM | POA: Diagnosis not present

## 2018-07-12 DIAGNOSIS — I509 Heart failure, unspecified: Secondary | ICD-10-CM | POA: Diagnosis not present

## 2018-07-12 DIAGNOSIS — I10 Essential (primary) hypertension: Secondary | ICD-10-CM | POA: Diagnosis not present

## 2018-07-12 DIAGNOSIS — I482 Chronic atrial fibrillation: Secondary | ICD-10-CM | POA: Diagnosis not present

## 2018-07-12 DIAGNOSIS — K219 Gastro-esophageal reflux disease without esophagitis: Secondary | ICD-10-CM | POA: Diagnosis not present

## 2018-08-04 DIAGNOSIS — F419 Anxiety disorder, unspecified: Secondary | ICD-10-CM | POA: Diagnosis not present

## 2018-08-04 DIAGNOSIS — F329 Major depressive disorder, single episode, unspecified: Secondary | ICD-10-CM | POA: Diagnosis not present

## 2018-08-09 DIAGNOSIS — K219 Gastro-esophageal reflux disease without esophagitis: Secondary | ICD-10-CM | POA: Diagnosis not present

## 2018-08-09 DIAGNOSIS — I10 Essential (primary) hypertension: Secondary | ICD-10-CM | POA: Diagnosis not present

## 2018-08-09 DIAGNOSIS — I509 Heart failure, unspecified: Secondary | ICD-10-CM | POA: Diagnosis not present

## 2018-08-09 DIAGNOSIS — I482 Chronic atrial fibrillation: Secondary | ICD-10-CM | POA: Diagnosis not present

## 2018-09-01 DIAGNOSIS — F419 Anxiety disorder, unspecified: Secondary | ICD-10-CM | POA: Diagnosis not present

## 2018-09-01 DIAGNOSIS — F329 Major depressive disorder, single episode, unspecified: Secondary | ICD-10-CM | POA: Diagnosis not present

## 2018-09-03 DIAGNOSIS — I10 Essential (primary) hypertension: Secondary | ICD-10-CM | POA: Diagnosis not present

## 2018-09-03 DIAGNOSIS — I482 Chronic atrial fibrillation: Secondary | ICD-10-CM | POA: Diagnosis not present

## 2018-09-03 DIAGNOSIS — K219 Gastro-esophageal reflux disease without esophagitis: Secondary | ICD-10-CM | POA: Diagnosis not present

## 2018-09-03 DIAGNOSIS — I509 Heart failure, unspecified: Secondary | ICD-10-CM | POA: Diagnosis not present

## 2018-09-21 DIAGNOSIS — I509 Heart failure, unspecified: Secondary | ICD-10-CM | POA: Diagnosis not present

## 2018-09-21 DIAGNOSIS — L259 Unspecified contact dermatitis, unspecified cause: Secondary | ICD-10-CM | POA: Diagnosis not present

## 2018-09-21 DIAGNOSIS — K5900 Constipation, unspecified: Secondary | ICD-10-CM | POA: Diagnosis not present

## 2018-09-21 DIAGNOSIS — I10 Essential (primary) hypertension: Secondary | ICD-10-CM | POA: Diagnosis not present

## 2018-09-29 DIAGNOSIS — F329 Major depressive disorder, single episode, unspecified: Secondary | ICD-10-CM | POA: Diagnosis not present

## 2018-09-29 DIAGNOSIS — F419 Anxiety disorder, unspecified: Secondary | ICD-10-CM | POA: Diagnosis not present

## 2018-10-04 DIAGNOSIS — I509 Heart failure, unspecified: Secondary | ICD-10-CM | POA: Diagnosis not present

## 2018-10-04 DIAGNOSIS — I482 Chronic atrial fibrillation, unspecified: Secondary | ICD-10-CM | POA: Diagnosis not present

## 2018-10-04 DIAGNOSIS — I10 Essential (primary) hypertension: Secondary | ICD-10-CM | POA: Diagnosis not present

## 2018-10-04 DIAGNOSIS — N400 Enlarged prostate without lower urinary tract symptoms: Secondary | ICD-10-CM | POA: Diagnosis not present

## 2018-10-18 DIAGNOSIS — I482 Chronic atrial fibrillation, unspecified: Secondary | ICD-10-CM | POA: Diagnosis not present

## 2018-10-18 DIAGNOSIS — R109 Unspecified abdominal pain: Secondary | ICD-10-CM | POA: Diagnosis not present

## 2018-10-18 DIAGNOSIS — I10 Essential (primary) hypertension: Secondary | ICD-10-CM | POA: Diagnosis not present

## 2018-10-18 DIAGNOSIS — I509 Heart failure, unspecified: Secondary | ICD-10-CM | POA: Diagnosis not present

## 2018-10-27 DIAGNOSIS — F329 Major depressive disorder, single episode, unspecified: Secondary | ICD-10-CM | POA: Diagnosis not present

## 2018-10-27 DIAGNOSIS — F419 Anxiety disorder, unspecified: Secondary | ICD-10-CM | POA: Diagnosis not present

## 2018-11-05 DIAGNOSIS — I10 Essential (primary) hypertension: Secondary | ICD-10-CM | POA: Diagnosis not present

## 2018-11-05 DIAGNOSIS — I482 Chronic atrial fibrillation, unspecified: Secondary | ICD-10-CM | POA: Diagnosis not present

## 2018-11-05 DIAGNOSIS — K219 Gastro-esophageal reflux disease without esophagitis: Secondary | ICD-10-CM | POA: Diagnosis not present

## 2018-11-05 DIAGNOSIS — I509 Heart failure, unspecified: Secondary | ICD-10-CM | POA: Diagnosis not present

## 2018-11-28 DIAGNOSIS — K5900 Constipation, unspecified: Secondary | ICD-10-CM | POA: Diagnosis not present

## 2018-11-28 DIAGNOSIS — I10 Essential (primary) hypertension: Secondary | ICD-10-CM | POA: Diagnosis not present

## 2018-11-28 DIAGNOSIS — I509 Heart failure, unspecified: Secondary | ICD-10-CM | POA: Diagnosis not present

## 2018-11-28 DIAGNOSIS — N400 Enlarged prostate without lower urinary tract symptoms: Secondary | ICD-10-CM | POA: Diagnosis not present

## 2018-12-01 DIAGNOSIS — F419 Anxiety disorder, unspecified: Secondary | ICD-10-CM | POA: Diagnosis not present

## 2018-12-01 DIAGNOSIS — F329 Major depressive disorder, single episode, unspecified: Secondary | ICD-10-CM | POA: Diagnosis not present

## 2018-12-27 DIAGNOSIS — I10 Essential (primary) hypertension: Secondary | ICD-10-CM | POA: Diagnosis not present

## 2018-12-27 DIAGNOSIS — I482 Chronic atrial fibrillation, unspecified: Secondary | ICD-10-CM | POA: Diagnosis not present

## 2018-12-27 DIAGNOSIS — K219 Gastro-esophageal reflux disease without esophagitis: Secondary | ICD-10-CM | POA: Diagnosis not present

## 2018-12-27 DIAGNOSIS — N400 Enlarged prostate without lower urinary tract symptoms: Secondary | ICD-10-CM | POA: Diagnosis not present

## 2019-01-05 DIAGNOSIS — F329 Major depressive disorder, single episode, unspecified: Secondary | ICD-10-CM | POA: Diagnosis not present

## 2019-01-05 DIAGNOSIS — F419 Anxiety disorder, unspecified: Secondary | ICD-10-CM | POA: Diagnosis not present

## 2019-01-22 DIAGNOSIS — K219 Gastro-esophageal reflux disease without esophagitis: Secondary | ICD-10-CM | POA: Diagnosis not present

## 2019-01-22 DIAGNOSIS — I509 Heart failure, unspecified: Secondary | ICD-10-CM | POA: Diagnosis not present

## 2019-01-22 DIAGNOSIS — I10 Essential (primary) hypertension: Secondary | ICD-10-CM | POA: Diagnosis not present

## 2019-01-22 DIAGNOSIS — I482 Chronic atrial fibrillation, unspecified: Secondary | ICD-10-CM | POA: Diagnosis not present

## 2019-01-30 ENCOUNTER — Encounter: Payer: Self-pay | Admitting: Internal Medicine

## 2019-02-02 DIAGNOSIS — F329 Major depressive disorder, single episode, unspecified: Secondary | ICD-10-CM | POA: Diagnosis not present

## 2019-02-02 DIAGNOSIS — F419 Anxiety disorder, unspecified: Secondary | ICD-10-CM | POA: Diagnosis not present

## 2019-02-28 DIAGNOSIS — I482 Chronic atrial fibrillation, unspecified: Secondary | ICD-10-CM | POA: Diagnosis not present

## 2019-02-28 DIAGNOSIS — I509 Heart failure, unspecified: Secondary | ICD-10-CM | POA: Diagnosis not present

## 2019-02-28 DIAGNOSIS — I10 Essential (primary) hypertension: Secondary | ICD-10-CM | POA: Diagnosis not present

## 2019-02-28 DIAGNOSIS — K5900 Constipation, unspecified: Secondary | ICD-10-CM | POA: Diagnosis not present

## 2019-03-02 DIAGNOSIS — F329 Major depressive disorder, single episode, unspecified: Secondary | ICD-10-CM | POA: Diagnosis not present

## 2019-03-02 DIAGNOSIS — F419 Anxiety disorder, unspecified: Secondary | ICD-10-CM | POA: Diagnosis not present

## 2019-03-28 DIAGNOSIS — I509 Heart failure, unspecified: Secondary | ICD-10-CM | POA: Diagnosis not present

## 2019-03-28 DIAGNOSIS — N400 Enlarged prostate without lower urinary tract symptoms: Secondary | ICD-10-CM | POA: Diagnosis not present

## 2019-03-28 DIAGNOSIS — K5900 Constipation, unspecified: Secondary | ICD-10-CM | POA: Diagnosis not present

## 2019-03-28 DIAGNOSIS — I10 Essential (primary) hypertension: Secondary | ICD-10-CM | POA: Diagnosis not present

## 2019-03-28 DIAGNOSIS — R270 Ataxia, unspecified: Secondary | ICD-10-CM | POA: Diagnosis not present

## 2019-03-28 DIAGNOSIS — I482 Chronic atrial fibrillation, unspecified: Secondary | ICD-10-CM | POA: Diagnosis not present

## 2019-03-28 DIAGNOSIS — Z9181 History of falling: Secondary | ICD-10-CM | POA: Diagnosis not present

## 2019-03-28 DIAGNOSIS — R7301 Impaired fasting glucose: Secondary | ICD-10-CM | POA: Diagnosis not present

## 2019-03-28 DIAGNOSIS — K219 Gastro-esophageal reflux disease without esophagitis: Secondary | ICD-10-CM | POA: Diagnosis not present

## 2019-04-03 DIAGNOSIS — E119 Type 2 diabetes mellitus without complications: Secondary | ICD-10-CM | POA: Diagnosis not present

## 2019-04-03 DIAGNOSIS — R7301 Impaired fasting glucose: Secondary | ICD-10-CM | POA: Diagnosis not present

## 2019-04-03 DIAGNOSIS — I482 Chronic atrial fibrillation, unspecified: Secondary | ICD-10-CM | POA: Diagnosis not present

## 2019-04-03 DIAGNOSIS — I4891 Unspecified atrial fibrillation: Secondary | ICD-10-CM | POA: Diagnosis not present

## 2019-04-06 DIAGNOSIS — F329 Major depressive disorder, single episode, unspecified: Secondary | ICD-10-CM | POA: Diagnosis not present

## 2019-04-06 DIAGNOSIS — F419 Anxiety disorder, unspecified: Secondary | ICD-10-CM | POA: Diagnosis not present

## 2019-04-10 DIAGNOSIS — I4891 Unspecified atrial fibrillation: Secondary | ICD-10-CM | POA: Diagnosis not present

## 2019-04-10 DIAGNOSIS — R7301 Impaired fasting glucose: Secondary | ICD-10-CM | POA: Diagnosis not present

## 2019-04-10 DIAGNOSIS — E119 Type 2 diabetes mellitus without complications: Secondary | ICD-10-CM | POA: Diagnosis not present

## 2019-04-25 DIAGNOSIS — I509 Heart failure, unspecified: Secondary | ICD-10-CM | POA: Diagnosis not present

## 2019-04-25 DIAGNOSIS — K5900 Constipation, unspecified: Secondary | ICD-10-CM | POA: Diagnosis not present

## 2019-04-25 DIAGNOSIS — E1165 Type 2 diabetes mellitus with hyperglycemia: Secondary | ICD-10-CM | POA: Diagnosis not present

## 2019-04-25 DIAGNOSIS — E669 Obesity, unspecified: Secondary | ICD-10-CM | POA: Diagnosis not present

## 2019-04-25 DIAGNOSIS — I482 Chronic atrial fibrillation, unspecified: Secondary | ICD-10-CM | POA: Diagnosis not present

## 2019-04-25 DIAGNOSIS — Z9181 History of falling: Secondary | ICD-10-CM | POA: Diagnosis not present

## 2019-04-25 DIAGNOSIS — I10 Essential (primary) hypertension: Secondary | ICD-10-CM | POA: Diagnosis not present

## 2019-04-25 DIAGNOSIS — N400 Enlarged prostate without lower urinary tract symptoms: Secondary | ICD-10-CM | POA: Diagnosis not present

## 2019-04-25 DIAGNOSIS — K219 Gastro-esophageal reflux disease without esophagitis: Secondary | ICD-10-CM | POA: Diagnosis not present

## 2019-05-04 DIAGNOSIS — F329 Major depressive disorder, single episode, unspecified: Secondary | ICD-10-CM | POA: Diagnosis not present

## 2019-05-04 DIAGNOSIS — F419 Anxiety disorder, unspecified: Secondary | ICD-10-CM | POA: Diagnosis not present

## 2019-05-28 DIAGNOSIS — L439 Lichen planus, unspecified: Secondary | ICD-10-CM | POA: Diagnosis not present

## 2019-05-28 DIAGNOSIS — I10 Essential (primary) hypertension: Secondary | ICD-10-CM | POA: Diagnosis not present

## 2019-05-28 DIAGNOSIS — E785 Hyperlipidemia, unspecified: Secondary | ICD-10-CM | POA: Diagnosis not present

## 2019-05-28 DIAGNOSIS — I482 Chronic atrial fibrillation, unspecified: Secondary | ICD-10-CM | POA: Diagnosis not present

## 2019-05-28 DIAGNOSIS — K5900 Constipation, unspecified: Secondary | ICD-10-CM | POA: Diagnosis not present

## 2019-05-28 DIAGNOSIS — E1165 Type 2 diabetes mellitus with hyperglycemia: Secondary | ICD-10-CM | POA: Diagnosis not present

## 2019-05-28 DIAGNOSIS — Z9181 History of falling: Secondary | ICD-10-CM | POA: Diagnosis not present

## 2019-05-28 DIAGNOSIS — I509 Heart failure, unspecified: Secondary | ICD-10-CM | POA: Diagnosis not present

## 2019-05-28 DIAGNOSIS — K219 Gastro-esophageal reflux disease without esophagitis: Secondary | ICD-10-CM | POA: Diagnosis not present

## 2019-05-28 DIAGNOSIS — R270 Ataxia, unspecified: Secondary | ICD-10-CM | POA: Diagnosis not present

## 2019-06-01 DIAGNOSIS — F329 Major depressive disorder, single episode, unspecified: Secondary | ICD-10-CM | POA: Diagnosis not present

## 2019-06-01 DIAGNOSIS — F419 Anxiety disorder, unspecified: Secondary | ICD-10-CM | POA: Diagnosis not present

## 2019-06-06 DIAGNOSIS — I482 Chronic atrial fibrillation, unspecified: Secondary | ICD-10-CM | POA: Diagnosis not present

## 2019-06-06 DIAGNOSIS — I10 Essential (primary) hypertension: Secondary | ICD-10-CM | POA: Diagnosis not present

## 2019-06-06 DIAGNOSIS — K5900 Constipation, unspecified: Secondary | ICD-10-CM | POA: Diagnosis not present

## 2019-06-06 DIAGNOSIS — E1165 Type 2 diabetes mellitus with hyperglycemia: Secondary | ICD-10-CM | POA: Diagnosis not present

## 2019-06-06 DIAGNOSIS — N400 Enlarged prostate without lower urinary tract symptoms: Secondary | ICD-10-CM | POA: Diagnosis not present

## 2019-06-06 DIAGNOSIS — I509 Heart failure, unspecified: Secondary | ICD-10-CM | POA: Diagnosis not present

## 2019-06-06 DIAGNOSIS — K219 Gastro-esophageal reflux disease without esophagitis: Secondary | ICD-10-CM | POA: Diagnosis not present

## 2019-06-27 DIAGNOSIS — I10 Essential (primary) hypertension: Secondary | ICD-10-CM | POA: Diagnosis not present

## 2019-06-27 DIAGNOSIS — K219 Gastro-esophageal reflux disease without esophagitis: Secondary | ICD-10-CM | POA: Diagnosis not present

## 2019-06-27 DIAGNOSIS — N400 Enlarged prostate without lower urinary tract symptoms: Secondary | ICD-10-CM | POA: Diagnosis not present

## 2019-06-27 DIAGNOSIS — I509 Heart failure, unspecified: Secondary | ICD-10-CM | POA: Diagnosis not present

## 2019-06-27 DIAGNOSIS — I482 Chronic atrial fibrillation, unspecified: Secondary | ICD-10-CM | POA: Diagnosis not present

## 2019-06-27 DIAGNOSIS — Z7982 Long term (current) use of aspirin: Secondary | ICD-10-CM | POA: Diagnosis not present

## 2019-06-27 DIAGNOSIS — Z9181 History of falling: Secondary | ICD-10-CM | POA: Diagnosis not present

## 2019-06-27 DIAGNOSIS — K5900 Constipation, unspecified: Secondary | ICD-10-CM | POA: Diagnosis not present

## 2019-07-06 DIAGNOSIS — F419 Anxiety disorder, unspecified: Secondary | ICD-10-CM | POA: Diagnosis not present

## 2019-07-06 DIAGNOSIS — F329 Major depressive disorder, single episode, unspecified: Secondary | ICD-10-CM | POA: Diagnosis not present

## 2019-07-25 DIAGNOSIS — I10 Essential (primary) hypertension: Secondary | ICD-10-CM | POA: Diagnosis not present

## 2019-07-25 DIAGNOSIS — R270 Ataxia, unspecified: Secondary | ICD-10-CM | POA: Diagnosis not present

## 2019-07-25 DIAGNOSIS — I482 Chronic atrial fibrillation, unspecified: Secondary | ICD-10-CM | POA: Diagnosis not present

## 2019-07-25 DIAGNOSIS — I509 Heart failure, unspecified: Secondary | ICD-10-CM | POA: Diagnosis not present

## 2019-07-25 DIAGNOSIS — Z9181 History of falling: Secondary | ICD-10-CM | POA: Diagnosis not present

## 2019-07-25 DIAGNOSIS — K5900 Constipation, unspecified: Secondary | ICD-10-CM | POA: Diagnosis not present

## 2019-07-25 DIAGNOSIS — K219 Gastro-esophageal reflux disease without esophagitis: Secondary | ICD-10-CM | POA: Diagnosis not present

## 2019-07-25 DIAGNOSIS — N400 Enlarged prostate without lower urinary tract symptoms: Secondary | ICD-10-CM | POA: Diagnosis not present

## 2019-08-02 DIAGNOSIS — F329 Major depressive disorder, single episode, unspecified: Secondary | ICD-10-CM | POA: Diagnosis not present

## 2019-08-02 DIAGNOSIS — F419 Anxiety disorder, unspecified: Secondary | ICD-10-CM | POA: Diagnosis not present

## 2019-08-29 DIAGNOSIS — E1165 Type 2 diabetes mellitus with hyperglycemia: Secondary | ICD-10-CM | POA: Diagnosis not present

## 2019-08-29 DIAGNOSIS — Z9181 History of falling: Secondary | ICD-10-CM | POA: Diagnosis not present

## 2019-08-29 DIAGNOSIS — E785 Hyperlipidemia, unspecified: Secondary | ICD-10-CM | POA: Diagnosis not present

## 2019-08-29 DIAGNOSIS — K219 Gastro-esophageal reflux disease without esophagitis: Secondary | ICD-10-CM | POA: Diagnosis not present

## 2019-08-29 DIAGNOSIS — N400 Enlarged prostate without lower urinary tract symptoms: Secondary | ICD-10-CM | POA: Diagnosis not present

## 2019-08-29 DIAGNOSIS — I509 Heart failure, unspecified: Secondary | ICD-10-CM | POA: Diagnosis not present

## 2019-08-29 DIAGNOSIS — I10 Essential (primary) hypertension: Secondary | ICD-10-CM | POA: Diagnosis not present

## 2019-08-29 DIAGNOSIS — K5900 Constipation, unspecified: Secondary | ICD-10-CM | POA: Diagnosis not present

## 2019-08-29 DIAGNOSIS — I482 Chronic atrial fibrillation, unspecified: Secondary | ICD-10-CM | POA: Diagnosis not present

## 2019-09-07 DIAGNOSIS — F329 Major depressive disorder, single episode, unspecified: Secondary | ICD-10-CM | POA: Diagnosis not present

## 2019-09-07 DIAGNOSIS — F419 Anxiety disorder, unspecified: Secondary | ICD-10-CM | POA: Diagnosis not present

## 2019-09-27 DIAGNOSIS — N400 Enlarged prostate without lower urinary tract symptoms: Secondary | ICD-10-CM | POA: Diagnosis not present

## 2019-09-27 DIAGNOSIS — K219 Gastro-esophageal reflux disease without esophagitis: Secondary | ICD-10-CM | POA: Diagnosis not present

## 2019-09-27 DIAGNOSIS — I509 Heart failure, unspecified: Secondary | ICD-10-CM | POA: Diagnosis not present

## 2019-09-27 DIAGNOSIS — I482 Chronic atrial fibrillation, unspecified: Secondary | ICD-10-CM | POA: Diagnosis not present

## 2019-09-27 DIAGNOSIS — Z7982 Long term (current) use of aspirin: Secondary | ICD-10-CM | POA: Diagnosis not present

## 2019-09-27 DIAGNOSIS — K5900 Constipation, unspecified: Secondary | ICD-10-CM | POA: Diagnosis not present

## 2019-09-27 DIAGNOSIS — I10 Essential (primary) hypertension: Secondary | ICD-10-CM | POA: Diagnosis not present

## 2019-09-27 DIAGNOSIS — E1165 Type 2 diabetes mellitus with hyperglycemia: Secondary | ICD-10-CM | POA: Diagnosis not present

## 2019-09-27 DIAGNOSIS — E785 Hyperlipidemia, unspecified: Secondary | ICD-10-CM | POA: Diagnosis not present

## 2019-09-27 DIAGNOSIS — Z9181 History of falling: Secondary | ICD-10-CM | POA: Diagnosis not present

## 2019-10-05 DIAGNOSIS — F419 Anxiety disorder, unspecified: Secondary | ICD-10-CM | POA: Diagnosis not present

## 2019-10-05 DIAGNOSIS — F329 Major depressive disorder, single episode, unspecified: Secondary | ICD-10-CM | POA: Diagnosis not present

## 2019-10-22 DIAGNOSIS — Z7982 Long term (current) use of aspirin: Secondary | ICD-10-CM | POA: Diagnosis not present

## 2019-10-22 DIAGNOSIS — R270 Ataxia, unspecified: Secondary | ICD-10-CM | POA: Diagnosis not present

## 2019-10-22 DIAGNOSIS — K219 Gastro-esophageal reflux disease without esophagitis: Secondary | ICD-10-CM | POA: Diagnosis not present

## 2019-10-22 DIAGNOSIS — I482 Chronic atrial fibrillation, unspecified: Secondary | ICD-10-CM | POA: Diagnosis not present

## 2019-10-22 DIAGNOSIS — Z9181 History of falling: Secondary | ICD-10-CM | POA: Diagnosis not present

## 2019-10-22 DIAGNOSIS — E1165 Type 2 diabetes mellitus with hyperglycemia: Secondary | ICD-10-CM | POA: Diagnosis not present

## 2019-10-22 DIAGNOSIS — I509 Heart failure, unspecified: Secondary | ICD-10-CM | POA: Diagnosis not present

## 2019-10-22 DIAGNOSIS — E785 Hyperlipidemia, unspecified: Secondary | ICD-10-CM | POA: Diagnosis not present

## 2019-10-22 DIAGNOSIS — I10 Essential (primary) hypertension: Secondary | ICD-10-CM | POA: Diagnosis not present

## 2019-10-22 DIAGNOSIS — N400 Enlarged prostate without lower urinary tract symptoms: Secondary | ICD-10-CM | POA: Diagnosis not present

## 2019-11-02 DIAGNOSIS — F329 Major depressive disorder, single episode, unspecified: Secondary | ICD-10-CM | POA: Diagnosis not present

## 2019-11-02 DIAGNOSIS — F419 Anxiety disorder, unspecified: Secondary | ICD-10-CM | POA: Diagnosis not present

## 2019-11-22 DIAGNOSIS — K219 Gastro-esophageal reflux disease without esophagitis: Secondary | ICD-10-CM | POA: Diagnosis not present

## 2019-11-22 DIAGNOSIS — I509 Heart failure, unspecified: Secondary | ICD-10-CM | POA: Diagnosis not present

## 2019-11-22 DIAGNOSIS — K5900 Constipation, unspecified: Secondary | ICD-10-CM | POA: Diagnosis not present

## 2019-11-22 DIAGNOSIS — I10 Essential (primary) hypertension: Secondary | ICD-10-CM | POA: Diagnosis not present

## 2019-11-22 DIAGNOSIS — N400 Enlarged prostate without lower urinary tract symptoms: Secondary | ICD-10-CM | POA: Diagnosis not present

## 2019-11-22 DIAGNOSIS — Z7982 Long term (current) use of aspirin: Secondary | ICD-10-CM | POA: Diagnosis not present

## 2019-11-22 DIAGNOSIS — R270 Ataxia, unspecified: Secondary | ICD-10-CM | POA: Diagnosis not present

## 2019-11-22 DIAGNOSIS — E785 Hyperlipidemia, unspecified: Secondary | ICD-10-CM | POA: Diagnosis not present

## 2019-11-22 DIAGNOSIS — E1165 Type 2 diabetes mellitus with hyperglycemia: Secondary | ICD-10-CM | POA: Diagnosis not present

## 2019-11-22 DIAGNOSIS — I482 Chronic atrial fibrillation, unspecified: Secondary | ICD-10-CM | POA: Diagnosis not present

## 2019-11-30 DIAGNOSIS — F329 Major depressive disorder, single episode, unspecified: Secondary | ICD-10-CM | POA: Diagnosis not present

## 2019-11-30 DIAGNOSIS — F419 Anxiety disorder, unspecified: Secondary | ICD-10-CM | POA: Diagnosis not present

## 2019-12-26 DIAGNOSIS — K219 Gastro-esophageal reflux disease without esophagitis: Secondary | ICD-10-CM | POA: Diagnosis not present

## 2019-12-26 DIAGNOSIS — N400 Enlarged prostate without lower urinary tract symptoms: Secondary | ICD-10-CM | POA: Diagnosis not present

## 2019-12-26 DIAGNOSIS — I10 Essential (primary) hypertension: Secondary | ICD-10-CM | POA: Diagnosis not present

## 2019-12-26 DIAGNOSIS — I509 Heart failure, unspecified: Secondary | ICD-10-CM | POA: Diagnosis not present

## 2019-12-26 DIAGNOSIS — K5900 Constipation, unspecified: Secondary | ICD-10-CM | POA: Diagnosis not present

## 2019-12-26 DIAGNOSIS — Z9181 History of falling: Secondary | ICD-10-CM | POA: Diagnosis not present

## 2019-12-26 DIAGNOSIS — Z7982 Long term (current) use of aspirin: Secondary | ICD-10-CM | POA: Diagnosis not present

## 2019-12-26 DIAGNOSIS — I482 Chronic atrial fibrillation, unspecified: Secondary | ICD-10-CM | POA: Diagnosis not present

## 2020-01-01 DIAGNOSIS — I482 Chronic atrial fibrillation, unspecified: Secondary | ICD-10-CM | POA: Diagnosis not present

## 2020-01-01 DIAGNOSIS — E119 Type 2 diabetes mellitus without complications: Secondary | ICD-10-CM | POA: Diagnosis not present

## 2020-01-01 DIAGNOSIS — R7301 Impaired fasting glucose: Secondary | ICD-10-CM | POA: Diagnosis not present

## 2020-01-01 DIAGNOSIS — E785 Hyperlipidemia, unspecified: Secondary | ICD-10-CM | POA: Diagnosis not present

## 2020-01-03 DIAGNOSIS — F329 Major depressive disorder, single episode, unspecified: Secondary | ICD-10-CM | POA: Diagnosis not present

## 2020-01-03 DIAGNOSIS — F419 Anxiety disorder, unspecified: Secondary | ICD-10-CM | POA: Diagnosis not present

## 2020-01-23 DIAGNOSIS — K219 Gastro-esophageal reflux disease without esophagitis: Secondary | ICD-10-CM | POA: Diagnosis not present

## 2020-01-23 DIAGNOSIS — N400 Enlarged prostate without lower urinary tract symptoms: Secondary | ICD-10-CM | POA: Diagnosis not present

## 2020-01-23 DIAGNOSIS — I10 Essential (primary) hypertension: Secondary | ICD-10-CM | POA: Diagnosis not present

## 2020-01-23 DIAGNOSIS — L439 Lichen planus, unspecified: Secondary | ICD-10-CM | POA: Diagnosis not present

## 2020-01-23 DIAGNOSIS — K5900 Constipation, unspecified: Secondary | ICD-10-CM | POA: Diagnosis not present

## 2020-01-23 DIAGNOSIS — E1165 Type 2 diabetes mellitus with hyperglycemia: Secondary | ICD-10-CM | POA: Diagnosis not present

## 2020-01-23 DIAGNOSIS — E785 Hyperlipidemia, unspecified: Secondary | ICD-10-CM | POA: Diagnosis not present

## 2020-01-23 DIAGNOSIS — Z9181 History of falling: Secondary | ICD-10-CM | POA: Diagnosis not present

## 2020-01-23 DIAGNOSIS — I482 Chronic atrial fibrillation, unspecified: Secondary | ICD-10-CM | POA: Diagnosis not present

## 2020-01-31 DIAGNOSIS — Z79899 Other long term (current) drug therapy: Secondary | ICD-10-CM | POA: Diagnosis not present

## 2020-02-01 DIAGNOSIS — F419 Anxiety disorder, unspecified: Secondary | ICD-10-CM | POA: Diagnosis not present

## 2020-02-01 DIAGNOSIS — R69 Illness, unspecified: Secondary | ICD-10-CM | POA: Diagnosis not present

## 2020-02-19 DIAGNOSIS — I10 Essential (primary) hypertension: Secondary | ICD-10-CM | POA: Diagnosis not present

## 2020-02-19 DIAGNOSIS — I482 Chronic atrial fibrillation, unspecified: Secondary | ICD-10-CM | POA: Diagnosis not present

## 2020-02-19 DIAGNOSIS — L299 Pruritus, unspecified: Secondary | ICD-10-CM | POA: Diagnosis not present

## 2020-02-19 DIAGNOSIS — N400 Enlarged prostate without lower urinary tract symptoms: Secondary | ICD-10-CM | POA: Diagnosis not present

## 2020-02-19 DIAGNOSIS — Z7982 Long term (current) use of aspirin: Secondary | ICD-10-CM | POA: Diagnosis not present

## 2020-02-19 DIAGNOSIS — K5900 Constipation, unspecified: Secondary | ICD-10-CM | POA: Diagnosis not present

## 2020-02-19 DIAGNOSIS — Z9181 History of falling: Secondary | ICD-10-CM | POA: Diagnosis not present

## 2020-02-19 DIAGNOSIS — I509 Heart failure, unspecified: Secondary | ICD-10-CM | POA: Diagnosis not present

## 2020-02-19 DIAGNOSIS — L439 Lichen planus, unspecified: Secondary | ICD-10-CM | POA: Diagnosis not present

## 2020-02-19 DIAGNOSIS — K219 Gastro-esophageal reflux disease without esophagitis: Secondary | ICD-10-CM | POA: Diagnosis not present

## 2020-02-29 DIAGNOSIS — Z79899 Other long term (current) drug therapy: Secondary | ICD-10-CM | POA: Diagnosis not present

## 2020-02-29 DIAGNOSIS — F419 Anxiety disorder, unspecified: Secondary | ICD-10-CM | POA: Diagnosis not present

## 2020-02-29 DIAGNOSIS — R69 Illness, unspecified: Secondary | ICD-10-CM | POA: Diagnosis not present

## 2020-03-18 DIAGNOSIS — E785 Hyperlipidemia, unspecified: Secondary | ICD-10-CM | POA: Diagnosis not present

## 2020-03-18 DIAGNOSIS — I482 Chronic atrial fibrillation, unspecified: Secondary | ICD-10-CM | POA: Diagnosis not present

## 2020-03-18 DIAGNOSIS — Z9181 History of falling: Secondary | ICD-10-CM | POA: Diagnosis not present

## 2020-03-18 DIAGNOSIS — E1165 Type 2 diabetes mellitus with hyperglycemia: Secondary | ICD-10-CM | POA: Diagnosis not present

## 2020-03-18 DIAGNOSIS — I10 Essential (primary) hypertension: Secondary | ICD-10-CM | POA: Diagnosis not present

## 2020-03-18 DIAGNOSIS — I509 Heart failure, unspecified: Secondary | ICD-10-CM | POA: Diagnosis not present

## 2020-03-18 DIAGNOSIS — L439 Lichen planus, unspecified: Secondary | ICD-10-CM | POA: Diagnosis not present

## 2020-03-18 DIAGNOSIS — K219 Gastro-esophageal reflux disease without esophagitis: Secondary | ICD-10-CM | POA: Diagnosis not present

## 2020-03-18 DIAGNOSIS — K5900 Constipation, unspecified: Secondary | ICD-10-CM | POA: Diagnosis not present

## 2020-03-18 DIAGNOSIS — Z7982 Long term (current) use of aspirin: Secondary | ICD-10-CM | POA: Diagnosis not present

## 2020-03-28 DIAGNOSIS — Z79899 Other long term (current) drug therapy: Secondary | ICD-10-CM | POA: Diagnosis not present

## 2020-03-28 DIAGNOSIS — F419 Anxiety disorder, unspecified: Secondary | ICD-10-CM | POA: Diagnosis not present

## 2020-03-28 DIAGNOSIS — R69 Illness, unspecified: Secondary | ICD-10-CM | POA: Diagnosis not present

## 2020-04-02 DIAGNOSIS — I509 Heart failure, unspecified: Secondary | ICD-10-CM | POA: Diagnosis not present

## 2020-04-02 DIAGNOSIS — E785 Hyperlipidemia, unspecified: Secondary | ICD-10-CM | POA: Diagnosis not present

## 2020-04-02 DIAGNOSIS — L03119 Cellulitis of unspecified part of limb: Secondary | ICD-10-CM | POA: Diagnosis not present

## 2020-04-02 DIAGNOSIS — I482 Chronic atrial fibrillation, unspecified: Secondary | ICD-10-CM | POA: Diagnosis not present

## 2020-04-02 DIAGNOSIS — E1165 Type 2 diabetes mellitus with hyperglycemia: Secondary | ICD-10-CM | POA: Diagnosis not present

## 2020-04-02 DIAGNOSIS — Z7982 Long term (current) use of aspirin: Secondary | ICD-10-CM | POA: Diagnosis not present

## 2020-04-02 DIAGNOSIS — K219 Gastro-esophageal reflux disease without esophagitis: Secondary | ICD-10-CM | POA: Diagnosis not present

## 2020-04-02 DIAGNOSIS — K5900 Constipation, unspecified: Secondary | ICD-10-CM | POA: Diagnosis not present

## 2020-04-02 DIAGNOSIS — I10 Essential (primary) hypertension: Secondary | ICD-10-CM | POA: Diagnosis not present

## 2020-04-03 DIAGNOSIS — Z79899 Other long term (current) drug therapy: Secondary | ICD-10-CM | POA: Diagnosis not present

## 2020-04-08 DIAGNOSIS — I509 Heart failure, unspecified: Secondary | ICD-10-CM | POA: Diagnosis not present

## 2020-04-08 DIAGNOSIS — I482 Chronic atrial fibrillation, unspecified: Secondary | ICD-10-CM | POA: Diagnosis not present

## 2020-04-08 DIAGNOSIS — R001 Bradycardia, unspecified: Secondary | ICD-10-CM | POA: Diagnosis not present

## 2020-04-08 DIAGNOSIS — I4891 Unspecified atrial fibrillation: Secondary | ICD-10-CM | POA: Diagnosis not present

## 2020-04-08 DIAGNOSIS — L03119 Cellulitis of unspecified part of limb: Secondary | ICD-10-CM | POA: Diagnosis not present

## 2020-04-08 DIAGNOSIS — I10 Essential (primary) hypertension: Secondary | ICD-10-CM | POA: Diagnosis not present

## 2020-04-08 DIAGNOSIS — K5900 Constipation, unspecified: Secondary | ICD-10-CM | POA: Diagnosis not present

## 2020-04-08 DIAGNOSIS — K219 Gastro-esophageal reflux disease without esophagitis: Secondary | ICD-10-CM | POA: Diagnosis not present

## 2020-04-08 DIAGNOSIS — E1165 Type 2 diabetes mellitus with hyperglycemia: Secondary | ICD-10-CM | POA: Diagnosis not present

## 2020-04-08 DIAGNOSIS — Z7982 Long term (current) use of aspirin: Secondary | ICD-10-CM | POA: Diagnosis not present

## 2020-04-23 DIAGNOSIS — I83009 Varicose veins of unspecified lower extremity with ulcer of unspecified site: Secondary | ICD-10-CM | POA: Diagnosis not present

## 2020-04-23 DIAGNOSIS — Z7982 Long term (current) use of aspirin: Secondary | ICD-10-CM | POA: Diagnosis not present

## 2020-04-23 DIAGNOSIS — N400 Enlarged prostate without lower urinary tract symptoms: Secondary | ICD-10-CM | POA: Diagnosis not present

## 2020-04-23 DIAGNOSIS — I509 Heart failure, unspecified: Secondary | ICD-10-CM | POA: Diagnosis not present

## 2020-04-23 DIAGNOSIS — I10 Essential (primary) hypertension: Secondary | ICD-10-CM | POA: Diagnosis not present

## 2020-04-23 DIAGNOSIS — K219 Gastro-esophageal reflux disease without esophagitis: Secondary | ICD-10-CM | POA: Diagnosis not present

## 2020-04-23 DIAGNOSIS — K5900 Constipation, unspecified: Secondary | ICD-10-CM | POA: Diagnosis not present

## 2020-04-23 DIAGNOSIS — I482 Chronic atrial fibrillation, unspecified: Secondary | ICD-10-CM | POA: Diagnosis not present

## 2020-05-02 DIAGNOSIS — R69 Illness, unspecified: Secondary | ICD-10-CM | POA: Diagnosis not present

## 2020-05-02 DIAGNOSIS — F419 Anxiety disorder, unspecified: Secondary | ICD-10-CM | POA: Diagnosis not present

## 2020-05-02 DIAGNOSIS — Z79899 Other long term (current) drug therapy: Secondary | ICD-10-CM | POA: Diagnosis not present

## 2020-05-03 DIAGNOSIS — Z79899 Other long term (current) drug therapy: Secondary | ICD-10-CM | POA: Diagnosis not present

## 2020-05-08 DIAGNOSIS — E119 Type 2 diabetes mellitus without complications: Secondary | ICD-10-CM | POA: Diagnosis not present

## 2020-05-08 DIAGNOSIS — Z961 Presence of intraocular lens: Secondary | ICD-10-CM | POA: Diagnosis not present

## 2020-05-21 DIAGNOSIS — K219 Gastro-esophageal reflux disease without esophagitis: Secondary | ICD-10-CM | POA: Diagnosis not present

## 2020-05-21 DIAGNOSIS — B029 Zoster without complications: Secondary | ICD-10-CM | POA: Diagnosis not present

## 2020-05-21 DIAGNOSIS — I509 Heart failure, unspecified: Secondary | ICD-10-CM | POA: Diagnosis not present

## 2020-05-21 DIAGNOSIS — N400 Enlarged prostate without lower urinary tract symptoms: Secondary | ICD-10-CM | POA: Diagnosis not present

## 2020-05-21 DIAGNOSIS — E1165 Type 2 diabetes mellitus with hyperglycemia: Secondary | ICD-10-CM | POA: Diagnosis not present

## 2020-05-21 DIAGNOSIS — I10 Essential (primary) hypertension: Secondary | ICD-10-CM | POA: Diagnosis not present

## 2020-05-21 DIAGNOSIS — I482 Chronic atrial fibrillation, unspecified: Secondary | ICD-10-CM | POA: Diagnosis not present

## 2020-05-21 DIAGNOSIS — E785 Hyperlipidemia, unspecified: Secondary | ICD-10-CM | POA: Diagnosis not present

## 2020-05-29 DIAGNOSIS — Z79899 Other long term (current) drug therapy: Secondary | ICD-10-CM | POA: Diagnosis not present

## 2020-05-30 DIAGNOSIS — R69 Illness, unspecified: Secondary | ICD-10-CM | POA: Diagnosis not present

## 2020-05-30 DIAGNOSIS — Z79899 Other long term (current) drug therapy: Secondary | ICD-10-CM | POA: Diagnosis not present

## 2020-05-30 DIAGNOSIS — F419 Anxiety disorder, unspecified: Secondary | ICD-10-CM | POA: Diagnosis not present

## 2020-06-09 DIAGNOSIS — I509 Heart failure, unspecified: Secondary | ICD-10-CM | POA: Diagnosis not present

## 2020-06-09 DIAGNOSIS — E1165 Type 2 diabetes mellitus with hyperglycemia: Secondary | ICD-10-CM | POA: Diagnosis not present

## 2020-07-17 ENCOUNTER — Other Ambulatory Visit: Payer: Self-pay

## 2020-07-17 ENCOUNTER — Emergency Department (HOSPITAL_COMMUNITY)
Admission: EM | Admit: 2020-07-17 | Discharge: 2020-07-18 | Disposition: A | Discharge status: Home / Self Care | Payer: Medicare Other | Attending: Emergency Medicine | Admitting: Emergency Medicine

## 2020-07-17 DIAGNOSIS — L299 Pruritus, unspecified: Secondary | ICD-10-CM | POA: Diagnosis present

## 2020-07-17 DIAGNOSIS — N1831 Chronic kidney disease, stage 3a: Secondary | ICD-10-CM | POA: Insufficient documentation

## 2020-07-17 DIAGNOSIS — Z7982 Long term (current) use of aspirin: Secondary | ICD-10-CM | POA: Diagnosis not present

## 2020-07-17 DIAGNOSIS — I129 Hypertensive chronic kidney disease with stage 1 through stage 4 chronic kidney disease, or unspecified chronic kidney disease: Secondary | ICD-10-CM | POA: Diagnosis not present

## 2020-07-17 DIAGNOSIS — Z87891 Personal history of nicotine dependence: Secondary | ICD-10-CM | POA: Diagnosis not present

## 2020-07-17 DIAGNOSIS — E119 Type 2 diabetes mellitus without complications: Secondary | ICD-10-CM | POA: Diagnosis not present

## 2020-07-17 DIAGNOSIS — Z79899 Other long term (current) drug therapy: Secondary | ICD-10-CM | POA: Diagnosis not present

## 2020-07-17 DIAGNOSIS — L282 Other prurigo: Secondary | ICD-10-CM

## 2020-07-17 MED ORDER — LORATADINE 10 MG PO TABS: 10.0000 mg | ORAL_TABLET | Freq: Every day | ORAL | 0 refills | Status: DC

## 2020-07-17 MED ORDER — HYDROXYZINE HCL 25 MG PO TABS: 25.0000 mg | ORAL_TABLET | Freq: Three times a day (TID) | ORAL | 0 refills | Status: AC | PRN

## 2020-07-17 MED ORDER — PERMETHRIN 5 % EX CREA: TOPICAL_CREAM | CUTANEOUS | 0 refills | Status: DC

## 2020-07-17 NOTE — ED Provider Notes (Signed)
[facility] DEPT Provider Note   CSN: [id number] Arrival date & time: 07/17/20  1929     History Chief Complaint  Patient presents with  . Pruritis    Veronica Guerrant is a 69 y.o. male.  69yo M w/ PMH below including colon CA, A fib, T2DM, HTN, HLD, CKD who p/w itching. PT reports 3 weeks of severe itchiness of skin on entire body. He states initially there was no rash present but he has been scratching so vigorously that he has made multiple excoriations everywhere. He notes he sweats a lot and thinks this makes it worse. He denies any new meds, new products, or other residents in his facility with similar symptoms. He denies any recent illness, hx of liver disease, abd pain, vomiting, fevers, or other complaints. He reports distant hx of lichen planus but states this is not the same.  The history is provided by the patient.       Past Medical History:  Diagnosis Date  . Atrial fibrillation ([place])   . Cataract   . Diabetes mellitus without complication ([place])   . Hyperlipidemia   . Hypertension   . Lichen planus    bilateral legs    Patient Active Problem List   Diagnosis Date Noted  . Adenocarcinoma of colon ([place])   . Rectal pain 12/04/2016  . 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 Polyp of cecum 11/17/2016  . Abnormal CT scan, colon   . Benign neoplasm of cecum   . AKI (acute kidney injury) ([place])   . Generalized weakness   . Fecal impaction ([place])   . Abdominal pain   . Impaction of colon ([place])   . Protein-calorie malnutrition, severe 11/10/2016  . Constipation 11/09/2016  . Urinary tract infection without hematuria 11/09/2016  . Hypokalemia 11/09/2016  . CKD (chronic kidney disease) stage 3, GFR 30-59 ml/min 11/09/2016  . Hyperbilirubinemia 11/09/2016  . Homelessness 11/09/2016  . Hyponatremia 11/08/2016  . RBBB 09/13/2013  . Snoring 09/13/2013  . Edema 09/07/2013  . Diabetes mellitus type 2 in obese ([place])  03/16/2013  . Arthritis 03/16/2013  . Essential hypertension, benign 02/23/2013  . Morbid obesity ([place]) 02/23/2013  . Atrial fibrillation ([place]) 02/23/2013  . Other and unspecified hyperlipidemia 02/23/2013    Past Surgical History:  Procedure Laterality Date  . APPENDECTOMY    . COLON RESECTION N/A 11/22/2016   Procedure: HAND ASSISTED LAPAROSCOPIC COLON RESECTION;  Surgeon: [name], MD;  [facility];  Service: General;  Laterality: N/A;  . COLONOSCOPY N/A 11/17/2016   Procedure: COLONOSCOPY;  Surgeon: [name], MD;  [facility];  Service: Endoscopy;  Laterality: N/A;  . FRACTURE SURGERY    . MULTIPLE TOOTH EXTRACTIONS    . RECTAL EXAM UNDER ANESTHESIA N/A 12/05/2016   Procedure: RECTAL EXAM UNDER ANESTHESIA, DISIMPACTION;  Surgeon: Alphonsa Overall, MD;  Location: [facility];  Service: General;  Laterality: N/A;       Family History  Problem Relation Age of Onset  . Brain cancer Mother   . Alzheimer's disease Father   . Cancer Maternal Grandmother   . Heart attack Maternal Grandfather   . Pneumonia Paternal Grandfather     Social History   Tobacco Use  . Smoking status: Former Smoker    Types: Pipe    Quit date: 09/14/1983    Years since quitting: 36.8  . Smokeless tobacco: Never Used  Substance Use Topics  . Alcohol use: No  . Drug use: No  Home Medications Prior to Admission medications   Medication Sig Start Date End Date Taking? Authorizing Provider  acetaminophen (TYLENOL) 325 MG tablet Take 2 tablets (650 mg total) by mouth every 6 (six) hours as needed (for pain and/or Fever >/= 101). 12/08/16   Barton Dubois, MD  aspirin EC 81 MG EC tablet Take 1 tablet (81 mg total) by mouth daily. 11/27/16   Barton Dubois, MD  busPIRone (BUSPAR) 7.5 MG tablet Take 1 tablet (7.5 mg total) by mouth 3 (three) times daily. 11/26/16   Barton Dubois, MD  digoxin (LANOXIN) 0.125 MG tablet Take 1 tablet (0.125 mg total) by mouth daily. 11/27/16   Barton Dubois, MD  docusate sodium (COLACE) 100 MG capsule Take 1 capsule (100 mg total) by mouth 2 (two) times daily. 12/08/16   Barton Dubois, MD  HYDROcodone-acetaminophen (NORCO/VICODIN) 5-325 MG tablet Take 1 tablet by mouth every 6 (six) hours as needed. 01/26/17   Varney Biles, MD  hydrocortisone (ANUSOL-HC) 2.5 % rectal cream Place rectally every 12 (twelve) hours as needed for hemorrhoids or itching. 11/26/16   Barton Dubois, MD  hydrOXYzine (ATARAX/VISTARIL) 25 MG tablet Take 1 tablet (25 mg total) by mouth every 8 (eight) hours as needed for itching. Use caution as this medication may make you drowsy 07/17/20   Verda Mehta, Wenda Overland, MD  ibuprofen (ADVIL,MOTRIN) 400 MG tablet Take 1 tablet (400 mg total) by mouth every 6 (six) hours as needed. 01/26/17   Varney Biles, MD  liver oil-zinc oxide (DESITIN) 40 % ointment Apply topically as needed for irritation. 11/26/16   Barton Dubois, MD  loratadine (CLARITIN) 10 MG tablet Take 1 tablet (10 mg total) by mouth daily. 07/17/20   Jerry Clyne, Wenda Overland, MD  Metoprolol Tartrate 75 MG TABS Take 75 mg by mouth 2 (two) times daily. 11/26/16   Barton Dubois, MD  Multiple Vitamin (MULTIVITAMIN) capsule Take 1 capsule by mouth daily.    [provider]  ondansetron (ZOFRAN ODT) 8 MG disintegrating tablet Take 1 tablet (8 mg total) by mouth every 8 (eight) hours as needed for nausea. 01/26/17   Varney Biles, MD  permethrin (ELIMITE) 5 % cream Apply over entire body, avoiding eyes and mouth,at night before bedtime; rinse off the following morning 07/17/20   Kastin Cerda, Wenda Overland, MD  polyethylene glycol [facility]) packet Take 17 g by mouth daily. Hold for diarrhea 12/08/16   Barton Dubois, MD  ranitidine (ZANTAC) 150 MG capsule Take 150 mg by mouth daily as needed for heartburn.     [provider]  tamsulosin (FLOMAX) 0.4 MG CAPS capsule Take 1 capsule (0.4 mg total) by mouth daily. 11/27/16   Barton Dubois, MD  UNABLE TO FIND Med Name:  House 2.0 take 237 mL four times daily    [provider]    Allergies    Other  Review of Systems   Review of Systems All other systems reviewed and are negative except that which was mentioned in HPI  Physical Exam Updated Vital Signs BP 118/61   Pulse 82   Temp 98.6 F (37 C) (Oral)   Resp 18   Ht 6\' 3"  (1.905 m)   Wt 119.7 kg   SpO2 98%   BMI 33.00 kg/m   Physical Exam Vitals and nursing note reviewed.  Constitutional:      General: He is not in acute distress.    Appearance: He is well-developed.  HENT:     Head: Normocephalic and atraumatic.     Mouth/Throat:  Mouth: Mucous membranes are moist.     Pharynx: Oropharynx is clear. No oropharyngeal exudate.  Eyes:     Conjunctiva/sclera: Conjunctivae normal.  Pulmonary:     Effort: Pulmonary effort is normal.  Musculoskeletal:     Cervical back: Neck supple.  Skin:    General: Skin is warm and dry.     Comments: Extensive, diffuse excoriations over entire body w/ scattered scabs, none of which appear infected, over chest, trunk, extremities, and lower back; no mucous membrane involvement  Neurological:     Mental Status: He is alert and oriented to person, place, and time.  Psychiatric:        Judgment: Judgment normal.     ED Results / Procedures / Treatments   Labs (all labs ordered are listed, but only abnormal results are displayed) Labs Reviewed - No data to display  EKG None  Radiology No results found.  Procedures Procedures (including critical care time)  Medications Ordered in ED Medications - No data to display  ED Course  I have reviewed the triage vital signs and the nursing notes.      MDM Rules/Calculators/A&P                          Pt w/ extensive excoriations throughout body. Actually the diffuse skin eruption resembles scabies although this seems less likely given he lives at nursing facility and no one else has these symptoms, however given the low risk of  side effects, I recommended permethrin treatment as well as claritin daily. Will give atarax to help with itching, cautioned on side effects. Warned on risk of infection if he continues to intensely scratch his skin. I see no underlying hives and he denies other sx to suggest acute allergic reaction. No hx liver failure. PT to f/u with PCP and dermatology. Final Clinical Impression(s) / ED Diagnoses Final diagnoses:  Pruritic rash    Rx / DC Orders ED Discharge Orders         Ordered    permethrin (ELIMITE) 5 % cream     Discontinue  Reprint     07/17/20 2015    loratadine (CLARITIN) 10 MG tablet  Daily     Discontinue  Reprint     07/17/20 2015    hydrOXYzine (ATARAX/VISTARIL) 25 MG tablet  Every 8 hours PRN     Discontinue  Reprint     07/17/20 2015           Hanson Medeiros, Wenda Overland, MD 07/17/20 2257

## 2020-07-17 NOTE — ED Triage Notes (Signed)
Patient here for all over itching x 3 weeks, denies pain, although he has past history of shingles. Patient comes from [facility], denies anyone else at facility having the same problem. Patient is AxOx4, ambulatory without assistance.

## 2021-02-18 ENCOUNTER — Emergency Department (HOSPITAL_COMMUNITY)
Admission: EM | Admit: 2021-02-18 | Discharge: 2021-02-19 | Disposition: A | Discharge status: Home / Self Care | Payer: Medicare Other | Attending: Emergency Medicine | Admitting: Emergency Medicine

## 2021-02-18 ENCOUNTER — Emergency Department (HOSPITAL_COMMUNITY): Payer: Medicare Other

## 2021-02-18 ENCOUNTER — Emergency Department (HOSPITAL_BASED_OUTPATIENT_CLINIC_OR_DEPARTMENT_OTHER): Payer: Medicare Other

## 2021-02-18 ENCOUNTER — Other Ambulatory Visit: Payer: Self-pay

## 2021-02-18 ENCOUNTER — Encounter (HOSPITAL_COMMUNITY): Payer: Self-pay | Admitting: Emergency Medicine

## 2021-02-18 DIAGNOSIS — L0390 Cellulitis, unspecified: Secondary | ICD-10-CM

## 2021-02-18 DIAGNOSIS — Z87891 Personal history of nicotine dependence: Secondary | ICD-10-CM | POA: Insufficient documentation

## 2021-02-18 DIAGNOSIS — Z7982 Long term (current) use of aspirin: Secondary | ICD-10-CM | POA: Diagnosis not present

## 2021-02-18 DIAGNOSIS — Z79899 Other long term (current) drug therapy: Secondary | ICD-10-CM | POA: Insufficient documentation

## 2021-02-18 DIAGNOSIS — Z85038 Personal history of other malignant neoplasm of large intestine: Secondary | ICD-10-CM | POA: Diagnosis not present

## 2021-02-18 DIAGNOSIS — N183 Chronic kidney disease, stage 3 unspecified: Secondary | ICD-10-CM | POA: Diagnosis not present

## 2021-02-18 DIAGNOSIS — I129 Hypertensive chronic kidney disease with stage 1 through stage 4 chronic kidney disease, or unspecified chronic kidney disease: Secondary | ICD-10-CM | POA: Insufficient documentation

## 2021-02-18 DIAGNOSIS — R600 Localized edema: Secondary | ICD-10-CM

## 2021-02-18 DIAGNOSIS — Z7984 Long term (current) use of oral hypoglycemic drugs: Secondary | ICD-10-CM | POA: Insufficient documentation

## 2021-02-18 DIAGNOSIS — R609 Edema, unspecified: Secondary | ICD-10-CM

## 2021-02-18 DIAGNOSIS — E1122 Type 2 diabetes mellitus with diabetic chronic kidney disease: Secondary | ICD-10-CM | POA: Insufficient documentation

## 2021-02-18 DIAGNOSIS — D696 Thrombocytopenia, unspecified: Secondary | ICD-10-CM | POA: Diagnosis not present

## 2021-02-18 LAB — CBC WITH DIFFERENTIAL/PLATELET
Abs Immature Granulocytes: 0.08 10*3/uL — ABNORMAL HIGH (ref 0.00–0.07)
Basophils Absolute: 0 10*3/uL (ref 0.0–0.1)
Basophils Relative: 0 %
Eosinophils Absolute: 0.2 10*3/uL (ref 0.0–0.5)
Eosinophils Relative: 3 %
HCT: 33.5 % — ABNORMAL LOW (ref 39.0–52.0)
Hemoglobin: 11 g/dL — ABNORMAL LOW (ref 13.0–17.0)
Immature Granulocytes: 2 %
Lymphocytes Relative: 10 %
Lymphs Abs: 0.5 10*3/uL — ABNORMAL LOW (ref 0.7–4.0)
MCH: 35.4 pg — ABNORMAL HIGH (ref 26.0–34.0)
MCHC: 32.8 g/dL (ref 30.0–36.0)
MCV: 107.7 fL — ABNORMAL HIGH (ref 80.0–100.0)
Monocytes Absolute: 0.3 10*3/uL (ref 0.1–1.0)
Monocytes Relative: 5 %
Neutro Abs: 4.3 10*3/uL (ref 1.7–7.7)
Neutrophils Relative %: 80 %
Platelets: 128 10*3/uL — ABNORMAL LOW (ref 150–400)
RBC: 3.11 MIL/uL — ABNORMAL LOW (ref 4.22–5.81)
RDW: 14.8 % (ref 11.5–15.5)
WBC: 5.3 10*3/uL (ref 4.0–10.5)
nRBC: 0 % (ref 0.0–0.2)

## 2021-02-18 LAB — COMPREHENSIVE METABOLIC PANEL
ALT: 17 U/L (ref 0–44)
AST: 16 U/L (ref 15–41)
Albumin: 3.8 g/dL (ref 3.5–5.0)
Alkaline Phosphatase: 76 U/L (ref 38–126)
Anion gap: 12 (ref 5–15)
BUN: 20 mg/dL (ref 8–23)
CO2: 28 mmol/L (ref 22–32)
Calcium: 9.3 mg/dL (ref 8.9–10.3)
Chloride: 99 mmol/L (ref 98–111)
Creatinine, Ser: 1.19 mg/dL (ref 0.61–1.24)
GFR, Estimated: >60 mL/min (ref >60)
Glucose, Bld: 130 mg/dL — ABNORMAL HIGH (ref 70–99)
Potassium: 4.2 mmol/L (ref 3.5–5.1)
Sodium: 139 mmol/L (ref 135–145)
Total Bilirubin: 1.4 mg/dL — ABNORMAL HIGH (ref 0.3–1.2)
Total Protein: 7.6 g/dL (ref 6.5–8.1)

## 2021-02-18 NOTE — ED Notes (Signed)
Spoke to [employer]. Updated that 2/# pts are ahead of pt. That it would be a couple hrs before pt could be picked up. Pt updated about ETA.

## 2021-02-18 NOTE — ED Notes (Signed)
Spoke to [facility] stated that pt was in line for pick up but that ETA was unavailable at this time.

## 2021-02-18 NOTE — Discharge Instructions (Addendum)
  Vascular specialist: We recommend follow-up with the vascular specialist for further assessment of the lower extremity swelling.  Call to make an appointment.  Wound care: A referral has been placed for assessment and management of the lower leg wounds through the wound care clinic.  Your primary care provider could also initiate routine wound care through a home health service.  Lower than normal platelet count (thrombocytopenia): Your platelet count was slightly lower than the normal level.  This could very well be due to the methotrexate.  We recommend monitoring and repeat testing of this blood level through your primary care provider or dermatology.  Typically, repeat blood counts are sampled monthly.

## 2021-02-18 NOTE — ED Notes (Signed)
Pt moved from room to [facility] bed to await transport by [facility]. Pt tolerated well. Updated that unknown ETA at this time. Will continue to monitor.

## 2021-02-18 NOTE — ED Notes (Signed)
Called [facility] to arrange transport back to facility for pt. Pt aware of delay due to awaiting transport. Pt aaox3, GCS 15, NAD noted. Resting comfortably in stretcher at this time.

## 2021-02-18 NOTE — CV Procedure (Signed)
BLE venous duplex completed. Arlean Hopping, PA given preliminary results.  Results can be found under chart review under CV PROC. 02/18/2021 12:55 PM Reiana Poteet RVT, RDMS

## 2021-02-18 NOTE — ED Triage Notes (Signed)
BIBA Per EMS: Pt coming from [facility] with complaints of cellulitis in both legs x1 month. Over the past week wounds have progressively gotten worse. Pt was put on prednisone which caregivers believe made sores worse; Blisters, redness, leaking bilaterally  A&Ox4  Vitals WDL

## 2021-02-18 NOTE — ED Provider Notes (Signed)
[facility] DEPT Provider Note   CSN: [id number] Arrival date & time: 02/18/21  1004     History No chief complaint on file.   [name] is a 70 y.o. male.  HPI      [name] is a 70 y.o. male, with a history of A. fib, DM, hyperlipidemia, HTN, presenting to the ED with several months of bilateral lower extremity edema. He arrives from facility, [facility], because the provider at the facility thought he "needed further evaluation." He notes erythema and crusting to the legs that has not worsened.  He thinks the swelling may have gotten worse in recent months. He has been evaluated multiple times by dermatology, most recently February 14, prescribed a prednisone taper without improvement.  He denies fever/chills, lower extremity pain, numbness, weakness, injuries, chest pain, shortness of breath, orthopnea, changes in urination or difficulty urinating, abdominal pain, or any other complaints.  Past Medical History:  Diagnosis Date  . Atrial fibrillation ([place])   . Cataract   . Diabetes mellitus without complication ([place])   . Hyperlipidemia   . Hypertension   . Lichen planus    bilateral legs    Patient Active Problem List   Diagnosis Date Noted  . Adenocarcinoma of colon ([place])   . Rectal pain 12/04/2016  . 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 Polyp of cecum 11/17/2016  . Abnormal CT scan, colon   . Benign neoplasm of cecum   . AKI (acute kidney injury) ([place])   . Generalized weakness   . Fecal impaction ([place])   . Abdominal pain   . Impaction of colon ([place])   . Protein-calorie malnutrition, severe 11/10/2016  . Constipation 11/09/2016  . Urinary tract infection without hematuria 11/09/2016  . Hypokalemia 11/09/2016  . CKD (chronic kidney disease) stage 3, GFR 30-59 ml/min (HCC) 11/09/2016  . Hyperbilirubinemia 11/09/2016  . Homelessness 11/09/2016  . Hyponatremia 11/08/2016  .  RBBB 09/13/2013  . Snoring 09/13/2013  . Edema 09/07/2013  . Diabetes mellitus type 2 in obese ([place]) 03/16/2013  . Arthritis 03/16/2013  . Essential hypertension, benign 02/23/2013  . Morbid obesity ([place]) 02/23/2013  . Atrial fibrillation ([place]) 02/23/2013  . Other and unspecified hyperlipidemia 02/23/2013    Past Surgical History:  Procedure Laterality Date  . APPENDECTOMY    . COLON RESECTION N/A 11/22/2016   Procedure: HAND ASSISTED LAPAROSCOPIC COLON RESECTION;  Surgeon: [name], MD;  Location: [facility] ORS;  Service: General;  Laterality: N/A;  . COLONOSCOPY N/A 11/17/2016   Procedure: COLONOSCOPY;  Surgeon: [name], MD;  Location: [facility] ENDOSCOPY;  Service: Endoscopy;  Laterality: N/A;  . FRACTURE SURGERY    . MULTIPLE TOOTH EXTRACTIONS    . RECTAL EXAM UNDER ANESTHESIA N/A 12/05/2016   Procedure: RECTAL EXAM UNDER ANESTHESIA, DISIMPACTION;  Surgeon: Alphonsa Overall, MD;  Location: [facility] ORS;  Service: General;  Laterality: N/A;       Family History  Problem Relation Age of Onset  . Brain cancer Mother   . Alzheimer's disease Father   . Cancer Maternal Grandmother   . Heart attack Maternal Grandfather   . Pneumonia Paternal Grandfather     Social History   Tobacco Use  . Smoking status: Former Smoker    Types: Pipe    Quit date: 09/14/1983    Years since quitting: 37.4  . Smokeless tobacco: Never Used  Substance Use Topics  . Alcohol use: No  . Drug use: No  Home Medications Prior to Admission medications   Medication Sig Start Date End Date Taking? Authorizing Provider  acetaminophen (TYLENOL) 325 MG tablet Take 2 tablets (650 mg total) by mouth every 6 (six) hours as needed (for pain and/or Fever >/= 101). 12/08/16  Yes Barton Dubois, MD  atorvastatin (LIPITOR) 20 MG tablet Take 20 mg by mouth daily. 02/02/21  Yes [provider]  digoxin (LANOXIN) 0.25 MG tablet Take 250 mcg by mouth daily. 02/02/21  Yes [provider]  folic acid  (FOLVITE) 1 MG tablet Take 1 mg by mouth daily. 01/29/21  Yes [provider]  glipiZIDE (GLUCOTROL) 10 MG tablet Take 10 mg by mouth 2 (two) times daily before a meal. 01/23/21  Yes [provider]  hydrOXYzine (ATARAX/VISTARIL) 25 MG tablet Take 1 tablet (25 mg total) by mouth every 8 (eight) hours as needed for itching. Use caution as this medication may make you drowsy 07/17/20  Yes Little, Wenda Overland, MD  JANUVIA 100 MG tablet Take 100 mg by mouth daily. 01/20/21  Yes [provider]  metFORMIN (GLUCOPHAGE) 1000 MG tablet Take 1,000 mg by mouth 2 (two) times daily with a meal. 01/23/21  Yes [provider]  methotrexate (RHEUMATREX) 2.5 MG tablet Take 10 mg by mouth once a week. Takes on Fridays. 01/29/21  Yes [provider]  metoprolol tartrate (LOPRESSOR) 25 MG tablet Take 12.5 mg by mouth 2 (two) times daily. 02/16/21  Yes [provider]  polyethylene glycol (MIRALAX) packet Take 17 g by mouth daily. Hold for diarrhea 12/08/16  Yes Barton Dubois, MD  tamsulosin (FLOMAX) 0.4 MG CAPS capsule Take 1 capsule (0.4 mg total) by mouth daily. 11/27/16  Yes Barton Dubois, MD  triamcinolone (KENALOG) 0.1 % Apply 1 application topically 2 (two) times daily. Apply to itchy areas   Yes [provider]  aspirin EC 81 MG EC tablet Take 1 tablet (81 mg total) by mouth daily. Patient not taking: No sig reported 11/27/16   Barton Dubois, MD  busPIRone (BUSPAR) 7.5 MG tablet Take 1 tablet (7.5 mg total) by mouth 3 (three) times daily. Patient not taking: No sig reported 11/26/16   Barton Dubois, MD  digoxin (LANOXIN) 0.125 MG tablet Take 1 tablet (0.125 mg total) by mouth daily. Patient not taking: Reported on 02/18/2021 11/27/16   Barton Dubois, MD  docusate sodium (COLACE) 100 MG capsule Take 1 capsule (100 mg total) by mouth 2 (two) times daily. Patient not taking: No sig reported 12/08/16   Barton Dubois, MD  HYDROcodone-acetaminophen  (NORCO/VICODIN) 5-325 MG tablet Take 1 tablet by mouth every 6 (six) hours as needed. Patient not taking: No sig reported 01/26/17   Varney Biles, MD  hydrocortisone (ANUSOL-HC) 2.5 % rectal cream Place rectally every 12 (twelve) hours as needed for hemorrhoids or itching. Patient not taking: No sig reported 11/26/16   Barton Dubois, MD  ibuprofen (ADVIL,MOTRIN) 400 MG tablet Take 1 tablet (400 mg total) by mouth every 6 (six) hours as needed. Patient not taking: No sig reported 01/26/17   Varney Biles, MD  liver oil-zinc oxide (DESITIN) 40 % ointment Apply topically as needed for irritation. Patient not taking: No sig reported 11/26/16   Barton Dubois, MD  loratadine (CLARITIN) 10 MG tablet Take 1 tablet (10 mg total) by mouth daily. Patient not taking: No sig reported 07/17/20   Little, Wenda Overland, MD  Metoprolol Tartrate 75 MG TABS Take 75 mg by mouth 2 (two) times daily. Patient not taking: Reported  on 02/18/2021 11/26/16   Barton Dubois, MD  ondansetron (ZOFRAN ODT) 8 MG disintegrating tablet Take 1 tablet (8 mg total) by mouth every 8 (eight) hours as needed for nausea. Patient not taking: No sig reported 01/26/17   Varney Biles, MD  permethrin (ELIMITE) 5 % cream Apply over entire body, avoiding eyes and mouth,at night before bedtime; rinse off the following morning Patient not taking: No sig reported 07/17/20   Little, Wenda Overland, MD    Allergies    Other  Review of Systems   Review of Systems  Constitutional: Negative for chills, diaphoresis and fever.  Respiratory: Negative for cough and shortness of breath.   Cardiovascular: Positive for leg swelling. Negative for chest pain.  Gastrointestinal: Negative for abdominal pain, nausea and vomiting.  Genitourinary: Negative for difficulty urinating.  Musculoskeletal: Negative for back pain.  Neurological: Negative for dizziness, syncope, weakness and numbness.  All other systems reviewed and are negative.   Physical  Exam Updated Vital Signs BP (!) 143/56   Pulse 71   Temp 98.2 F (36.8 C) (Oral)   Resp 18   SpO2 97%   Physical Exam Vitals and nursing note reviewed.  Constitutional:      General: He is not in acute distress.    Appearance: He is well-developed. He is not diaphoretic.  HENT:     Head: Normocephalic and atraumatic.     Mouth/Throat:     Mouth: Mucous membranes are moist.     Pharynx: Oropharynx is clear.  Eyes:     Conjunctiva/sclera: Conjunctivae normal.  Cardiovascular:     Rate and Rhythm: Normal rate and regular rhythm.     Pulses:          Radial pulses are 2+ on the right side and 2+ on the left side.       Dorsalis pedis pulses are 1+ on the right side and 1+ on the left side.     Heart sounds: Normal heart sounds.     Comments: Tactile temperature in the extremities appropriate and equal bilaterally. Pulmonary:     Effort: Pulmonary effort is normal. No respiratory distress.     Breath sounds: Normal breath sounds.  Abdominal:     Palpations: Abdomen is soft.     Tenderness: There is no abdominal tenderness. There is no guarding.  Musculoskeletal:     Cervical back: Neck supple.     Right lower leg: Edema present.     Left lower leg: Edema present.     Comments: Bilateral lower extremity edema below the knee with erythema, crusting, skin thickening. There is no tenderness or noted increased warmth. Adequate range of motion in the ankles and feet. No noted areas of purulence.  Skin:    General: Skin is warm and dry.  Neurological:     Mental Status: He is alert.  Psychiatric:        Mood and Affect: Mood and affect normal.        Speech: Speech normal.        Behavior: Behavior normal.                 ED Results / Procedures / Treatments   Labs (all labs ordered are listed, but only abnormal results are displayed) Labs Reviewed  CBC WITH DIFFERENTIAL/PLATELET - Abnormal; Notable for the following components:      Result Value   RBC 3.11  (*)    Hemoglobin 11.0 (*)    HCT 33.5 (*)  MCV 107.7 (*)    MCH 35.4 (*)    Platelets 128 (*)    Lymphs Abs 0.5 (*)    Abs Immature Granulocytes 0.08 (*)    All other components within normal limits  COMPREHENSIVE METABOLIC PANEL - Abnormal; Notable for the following components:   Glucose, Bld 130 (*)    Total Bilirubin 1.4 (*)    All other components within normal limits   Hemoglobin  Date Value Ref Range Status  02/18/2021 11.0 (L) 13.0 - 17.0 g/dL Final  01/26/2017 11.3 (L) 13.0 - 17.0 g/dL Final  12/06/2016 10.5 (L) 13.0 - 17.0 g/dL Final  12/05/2016 9.7 (L) 13.0 - 17.0 g/dL Final   EKG None  Radiology DG Chest 2 View  Result Date: 02/18/2021 CLINICAL DATA:  Bilateral lower extremity swelling. EXAM: CHEST - 2 VIEW COMPARISON:  None. FINDINGS: The lungs are clear. No pneumothorax or pleural fluid. Heart size is normal. No acute or focal bony abnormality. IMPRESSION: No acute disease. Electronically Signed   By: Inge Rise M.D.   On: 02/18/2021 11:22   VAS [place] LOWER EXTREMITY VENOUS (DVT) (ONLY [facility] & [facility] 7a-7p)  Result Date: 02/18/2021  Lower Venous DVT Study Indications: Cellulitis - edema / redness / skin changes.  Comparison Study: No previous exams Performing Technologist: Rogelia Rohrer  Examination Guidelines: A complete evaluation includes B-mode imaging, spectral Doppler, color Doppler, and power Doppler as needed of all accessible portions of each vessel. Bilateral testing is considered an integral part of a complete examination. Limited examinations for reoccurring indications may be performed as noted. The reflux portion of the exam is performed with the patient in reverse Trendelenburg.  +---------+---------------+---------+-----------+----------+------------------+ RIGHT    CompressibilityPhasicitySpontaneityPropertiesThrombus Aging     +---------+---------------+---------+-----------+----------+------------------+ CFV      Full           Yes      Yes                                      +---------+---------------+---------+-----------+----------+------------------+ SFJ      Full                                                            +---------+---------------+---------+-----------+----------+------------------+ FV Prox  Full           Yes      Yes                                     +---------+---------------+---------+-----------+----------+------------------+ FV Mid   Full           Yes      Yes                                     +---------+---------------+---------+-----------+----------+------------------+ FV DistalFull           Yes      Yes                                     +---------+---------------+---------+-----------+----------+------------------+  PFV      Full                                                            +---------+---------------+---------+-----------+----------+------------------+ POP      Full           Yes      Yes                                     +---------+---------------+---------+-----------+----------+------------------+ PTV      Full                                                            +---------+---------------+---------+-----------+----------+------------------+ PERO                                                  Not seen on this                                                         exam               +---------+---------------+---------+-----------+----------+------------------+   Right Technical Findings: Not visualized segments include Peroneal veins.  +---------+---------------+---------+-----------+----------+-------------------+ LEFT     CompressibilityPhasicitySpontaneityPropertiesThrombus Aging      +---------+---------------+---------+-----------+----------+-------------------+ CFV      Full           Yes      Yes                                       +---------+---------------+---------+-----------+----------+-------------------+ SFJ      Full                                                             +---------+---------------+---------+-----------+----------+-------------------+ FV Prox  Full           Yes      Yes                                      +---------+---------------+---------+-----------+----------+-------------------+ FV Mid   Full           Yes      Yes                                      +---------+---------------+---------+-----------+----------+-------------------+ FV DistalFull  Yes      Yes                  Not well visualized +---------+---------------+---------+-----------+----------+-------------------+ PFV      Full                                                             +---------+---------------+---------+-----------+----------+-------------------+ POP      Full           Yes      Yes                                      +---------+---------------+---------+-----------+----------+-------------------+ PTV      Full                                                             +---------+---------------+---------+-----------+----------+-------------------+ PERO     Full                                         Not well visualized +---------+---------------+---------+-----------+----------+-------------------+     Summary: BILATERAL: - No evidence of deep vein thrombosis seen in the lower extremities, bilaterally. - No evidence of superficial venous thrombosis in the lower extremities, bilaterally. -No evidence of popliteal cyst, bilaterally. Subcutaneous edema throughout BLE - worse in calves.   *See table(s) above for measurements and observations.    Preliminary     Procedures Procedures   Medications Ordered in ED Medications - No data to display  ED Course  I have reviewed the triage vital signs and the nursing notes.  Pertinent labs & imaging results  that were available during my care of the patient were reviewed by me and considered in my medical decision making (see chart for details).  Clinical Course as of 02/18/21 1518  Wed Feb 18, 2021  1442 Platelets(!): 128 Patient has started methotrexate therapy and this could account for his mild thrombocytopenia. [SJ]    Clinical Course User Index [SJ] Joy, Helane Gunther, PA-C   MDM Rules/Calculators/A&P                          Patient presents with several months of bilateral lower extremity edema. Patient is nontoxic appearing, afebrile, not tachycardic, not tachypneic, not hypotensive, maintains excellent SPO2 on room air, and is in no apparent distress.   I have reviewed the patient's chart to obtain more information.   I reviewed and interpreted the patient's labs and radiological studies. Lab work overall reassuring.  Mild thrombocytopenia addressed above. No evidence of DVT on ultrasound.  Chest x-ray unremarkable. We will advised patient follow-up with vascular surgery for further assessment.  Continue to follow with PCP and dermatology as well. The patient was given instructions for home care as well as return precautions. Patient voices understanding of these instructions, accepts the plan, and is comfortable with discharge.  Findings and plan of care discussed  with attending physician, Blanchie Dessert, MD. Dr. Maryan Rued personally evaluated and examined this patient.  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- worsened, not at treatment goal Final Diagnosis  Date Value Ref Range Status  10/20/2020 Final  Hand, punch - Subacute eczematous dermatitis with lympho- eosinophilic infiltrate consistent with allergic contact dermatitis  - Initially thought to be due to antibacterial soap at  facility - Had COVID in mid January 2021 - s/p permethrin in ED in august 2021 and TAC 0.1% cream; ivermectin, hydroxyzine, and cetrizine - Would consider methotrexate, plan to start methotrexate - Discuss opt of starting prednisone taper for acute flare - Pt can continue triamcinolone as needed for itching  - predniSONE (DELTASONE) 10 MG tablet; 6 tabs/day x3d, then 5 tabs/day x3d, then 4 tabs/day x3d, then 3 tabs/day x3d, then 2 tabs/day x3d, then 1 tab/day x3d, then 1/2 tab/day x4d Dispense: 65 tablet; Refill: 0  Stasis Dermatitis- BLE- stable but may be driving the overall rash - Encouraged to elevate their legs for 1 hour three times a day - Discussed compression stockings  - Continue triamcinolone cream"   Final Clinical Impression(s) / ED Diagnoses Final diagnoses:  Bilateral lower extremity edema    Rx / DC Orders ED Discharge Orders         Ordered    Ambulatory referral to Wound Clinic        02/18/21 1513           Layla Maw 02/18/21 1518    Blanchie Dessert, MD 02/18/21 2112

## 2021-02-26 DIAGNOSIS — M25572 Pain in left ankle and joints of left foot: Secondary | ICD-10-CM | POA: Insufficient documentation

## 2021-03-26 ENCOUNTER — Encounter (HOSPITAL_BASED_OUTPATIENT_CLINIC_OR_DEPARTMENT_OTHER): Payer: Medicare Other | Attending: Internal Medicine | Admitting: Internal Medicine

## 2021-03-26 ENCOUNTER — Other Ambulatory Visit: Payer: Self-pay

## 2021-03-26 DIAGNOSIS — L97822 Non-pressure chronic ulcer of other part of left lower leg with fat layer exposed: Secondary | ICD-10-CM | POA: Insufficient documentation

## 2021-03-26 DIAGNOSIS — E1122 Type 2 diabetes mellitus with diabetic chronic kidney disease: Secondary | ICD-10-CM | POA: Diagnosis not present

## 2021-03-26 DIAGNOSIS — N183 Chronic kidney disease, stage 3 unspecified: Secondary | ICD-10-CM | POA: Insufficient documentation

## 2021-03-26 DIAGNOSIS — L97829 Non-pressure chronic ulcer of other part of left lower leg with unspecified severity: Secondary | ICD-10-CM

## 2021-03-26 DIAGNOSIS — L97812 Non-pressure chronic ulcer of other part of right lower leg with fat layer exposed: Secondary | ICD-10-CM | POA: Diagnosis not present

## 2021-03-26 DIAGNOSIS — Z85038 Personal history of other malignant neoplasm of large intestine: Secondary | ICD-10-CM | POA: Insufficient documentation

## 2021-03-26 DIAGNOSIS — I890 Lymphedema, not elsewhere classified: Secondary | ICD-10-CM

## 2021-03-26 DIAGNOSIS — Z87891 Personal history of nicotine dependence: Secondary | ICD-10-CM | POA: Diagnosis not present

## 2021-03-26 DIAGNOSIS — I872 Venous insufficiency (chronic) (peripheral): Secondary | ICD-10-CM

## 2021-03-26 DIAGNOSIS — L97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 with unspecified severity: Secondary | ICD-10-CM | POA: Diagnosis not present

## 2021-03-26 DIAGNOSIS — L299 Pruritus, unspecified: Secondary | ICD-10-CM | POA: Insufficient documentation

## 2021-03-26 DIAGNOSIS — I4891 Unspecified atrial fibrillation: Secondary | ICD-10-CM | POA: Insufficient documentation

## 2021-03-26 DIAGNOSIS — I129 Hypertensive chronic kidney disease with stage 1 through stage 4 chronic kidney disease, or unspecified chronic kidney disease: Secondary | ICD-10-CM | POA: Insufficient documentation

## 2021-03-26 DIAGNOSIS — E11622 Type 2 diabetes mellitus with other skin ulcer: Secondary | ICD-10-CM | POA: Insufficient documentation

## 2021-03-26 NOTE — Progress Notes (Signed)
Reese, Allen ([id number]) Visit Report for 03/26/2021 Abuse/Suicide Risk Screen Details Patient Name: Date of Service: [name] Allen L. 03/26/2021 1:15 PM Medical Record Number: [id number] Patient Account Number: [id number] Date of Birth/Sex: Treating RN: February 08, 1951 (70 y.o. Male) Lorrin Jackson Primary Care Lorene Samaan: PA [name], NO Other Clinician: Referring Terrall Bley: Treating Nickolas Chalfin/Extender: Pollyann Glen, SHA WN Weeks in Treatment: 0 Abuse/Suicide Risk Screen Items Answer ABUSE RISK SCREEN: Has anyone close to you tried to hurt or harm you recentlyo No Do you feel uncomfortable with anyone in your familyo No Has anyone forced you do things that you didnt want to doo No Electronic Signature(s) Signed: 03/26/2021 6:07:55 PM By: Lorrin Jackson Entered By: Lorrin Jackson on 03/26/2021 13:57:15 -------------------------------------------------------------------------------- Activities of Daily Living Details Patient Name: Date of Service: [name] Allen L. 03/26/2021 1:15 PM Medical Record Number: [id number] Patient Account Number: [id number] Date of Birth/Sex: Treating RN: [birth year] (70 y.o. Male) Lorrin Jackson Primary Care Stephenie Navejas: PA [name], NO Other Clinician: Referring Luciann Gossett: Treating Derril Franek/Extender: Pollyann Glen, SHA WN Weeks in Treatment: 0 Activities of Daily Living Items Answer Activities of Daily Living (Please select one for each item) Drive Automobile Not Able T Medications ake Need Assistance Use T elephone Completely Able Care for Appearance Completely Able Use T oilet Completely Able Bath / Shower Need Assistance Dress Self Completely Able Feed Self Completely Able Walk Completely Able Get In / Out Bed Completely Able Housework Need Assistance Prepare Meals Need Assistance Handle Money Completely Able Shop for Self Need Assistance Electronic Signature(s) Signed: 03/26/2021 6:07:55 PM By: Lorrin Jackson Entered By:  Lorrin Jackson on 03/26/2021 13:58:22 -------------------------------------------------------------------------------- Education Screening Details Patient Name: Date of Service: [name], [name] 03/26/2021 1:15 PM Medical Record Number: [id number] Patient Account Number: [id number] Date of Birth/Sex: Treating RN: 1951-05-30 (70 y.o. Male) Lorrin Jackson Primary Care Blake Vetrano: PA [name], NO Other Clinician: Referring Doneisha Ivey: Treating Ellenie Salome/Extender: Pollyann Glen, SHA WN Weeks in Treatment: 0 Primary Learner Assessed: Patient Learning Preferences/Education Level/Primary Language Learning Preference: Explanation, Demonstration, Printed Material Highest Education Level: College or Above Preferred Language: English Cognitive Barrier Language Barrier: No Translator Needed: No Memory Deficit: No Emotional Barrier: No Cultural/Religious Beliefs Affecting Medical Care: No Physical Barrier Impaired Vision: No Impaired Hearing: No Decreased Hand dexterity: No Knowledge/Comprehension Knowledge Level: High Comprehension Level: High Ability to understand written instructions: High Ability to understand verbal instructions: High Motivation Anxiety Level: Calm Cooperation: Cooperative Education Importance: Acknowledges Need Interest in Health Problems: Asks Questions Perception: Coherent Willingness to Engage in Self-Management High Activities: Readiness to Engage in Self-Management High Activities: Electronic Signature(s) Signed: 03/26/2021 6:07:55 PM By: Lorrin Jackson Entered By: Lorrin Jackson on 03/26/2021 13:58:57 -------------------------------------------------------------------------------- Fall Risk Assessment Details Patient Name: Date of Service: [name], [name] 03/26/2021 1:15 PM Medical Record Number: [id number] Patient Account Number: [id number] Date of Birth/Sex: Treating RN: 1951/11/27 (70 y.o. Male) Lorrin Jackson Primary Care Shanoah Asbill: PA  [name], NO Other Clinician: Referring Jaquil Todt: Treating Ryott Rafferty/Extender: Pollyann Glen, SHA WN Weeks in Treatment: 0 Fall Risk Assessment Items Have you had 2 or more falls in the last 12 monthso 0 No Have you had any fall that resulted in injury in the last 12 monthso 0 No FALLS RISK SCREEN History of falling - immediate or within 3 months 0 No Secondary diagnosis (Do you have 2 or more medical diagnoseso) 0 No Ambulatory aid None/bed rest/wheelchair/nurse 0 Yes Crutches/cane/walker 0 No Furniture 0 No Intravenous therapy  Access/Saline/Heparin Lock 0 No Gait/Transferring Normal/ bed rest/ wheelchair 0 Yes Weak (short steps with or without shuffle, stooped but able to lift head while walking, may seek 0 No support from furniture) Impaired (short steps with shuffle, may have difficulty arising from chair, head down, impaired 0 No balance) Mental Status Oriented to own ability 0 Yes Electronic Signature(s) Signed: 03/26/2021 6:07:55 PM By: Lorrin Jackson Entered By: Lorrin Jackson on 03/26/2021 14:02:58 -------------------------------------------------------------------------------- Foot Assessment Details Patient Name: Date of Service: [name], [name] 03/26/2021 1:15 PM Medical Record Number: [id number] Patient Account Number: [id number] Date of Birth/Sex: Treating RN: [birth year] (70 y.o. Male) Lorrin Jackson Primary Care Shatora Weatherbee: PA [name], NO Other Clinician: Referring Chigozie Basaldua: Treating Taheem Fricke/Extender: Pollyann Glen, SHA WN Weeks in Treatment: 0 Foot Assessment Items Site Locations + = Sensation present, - = Sensation absent, C = Callus, U = Ulcer R = Redness, W = Warmth, M = Maceration, PU = Pre-ulcerative lesion F = Fissure, S = Swelling, D = Dryness Assessment Right: Left: Other Deformity: No No Prior Foot Ulcer: No No Prior Amputation: No No Charcot Joint: No No Ambulatory Status: Ambulatory Without Help Gait: Steady Electronic  Signature(s) Signed: 03/26/2021 6:07:55 PM By: Lorrin Jackson Entered By: Lorrin Jackson on 03/26/2021 14:04:35 -------------------------------------------------------------------------------- Nutrition Risk Screening Details Patient Name: Date of Service: [name], [name] 03/26/2021 1:15 PM Medical Record Number: [id number] Patient Account Number: [id number] Date of Birth/Sex: Treating RN: [birth year] (70 y.o. Male) Lorrin Jackson Primary Care Mykael Batz: PA [name], NO Other Clinician: Referring Clayborn Milnes: Treating Kani Jobson/Extender: Pollyann Glen, SHA WN Weeks in Treatment: 0 Height (in): Weight (lbs): Body Mass Index (BMI): Nutrition Risk Screening Items Score Screening NUTRITION RISK SCREEN: I have an illness or condition that made me change the kind and/or amount of food I eat 0 No I eat fewer than two meals per day 0 No I eat few fruits and vegetables, or milk products 0 No I have three or more drinks of beer, liquor or wine almost every day 0 No I have tooth or mouth problems that make it hard for me to eat 0 No I don't always have enough money to buy the food I need 0 No I eat alone most of the time 0 No I take three or more different prescribed or over-the-counter drugs a day 1 Yes Without wanting to, I have lost or gained 10 pounds in the last six months 2 Yes I am not always physically able to shop, cook and/or feed myself 0 No Nutrition Protocols Good Risk Protocol Moderate Risk Protocol 0 Provide education on nutrition High Risk Proctocol Risk Level: Moderate Risk Score: 3 Electronic Signature(s) Signed: 03/26/2021 6:07:55 PM By: Lorrin Jackson Entered By: Lorrin Jackson on 03/26/2021 13:59:24

## 2021-03-26 NOTE — Progress Notes (Signed)
[name] ([id number]) Visit Report for 03/26/2021 Chief Complaint Document Details Patient Name: Date of Service: [name] Allen L. 03/26/2021 1:15 PM Medical Record Number: [id number] Patient Account Number: [id number] Date of Birth/Sex: Treating RN: [birth year] (70 y.o. Male) [name] Primary Care [name]: PA [name], NO Other Clinician: Referring [name]: Treating [name]/Extender: [name], SHA [name] in Treatment: 0 Information Obtained from: Patient Chief Complaint bilateral lower extremity swelling with wounds Electronic Signature(s) Signed: 03/26/2021 4:25:40 PM By: Allen Shan DO Entered By: [name] on 03/26/2021 15:45:26 -------------------------------------------------------------------------------- HPI Details Patient Name: Date of Service: [name], [name] Allen L. 03/26/2021 1:15 PM Medical Record Number: [id number] Patient Account Number: [id number] Date of Birth/Sex: Treating RN: 05/17/1951 (70 y.o. Male) [name] Primary Care [name]: PA [name], NO Other Clinician: Referring [name]: Treating [name]/Extender: [name], SHA [name] in Treatment: 0 History of Present Illness HPI Description: [name] is a 69 year old male with a past medical history of type 2 diabetes and essential hypertension who presents to the clinic today for bilateral lower extremity wounds. He was recently seen in the ED on 3/9 for bilateral lower extremity edema. He had a DVT study that was negative for clots. He was given triamcinolone cream and advised to elevate legs and obtain compression stockings. He has been using the triamcinolone cream however he is unable to use compression stockings due to difficulty of putting on. Patient states that for the past 2 years he has had opening and closing of weeping wounds to his legs bilaterally. They spontaneously open and close. They are often open for several Reese before they  heal spontaneously. He denies any purulent drainage, increased warmth or increased erythema to the skin. Electronic Signature(s) Signed: 03/26/2021 4:25:40 PM By: Allen Shan DO Entered By: [name] on 03/26/2021 15:49:13 -------------------------------------------------------------------------------- Physical Exam Details Patient Name: Date of Service: [name], [name] Allen L. 03/26/2021 1:15 PM Medical Record Number: [id number] Patient Account Number: [id number] Date of Birth/Sex: Treating RN: 1951-02-17 (70 y.o. Male) [name] Primary Care Ukiah Trawick: PA [name], NO Other Clinician: Referring Alayah Reese: Treating [name]/Extender: [name], SHA [name] in Treatment: 0 Constitutional respirations regular, non-labored and within target range for patient.. Notes Left lower extremity: Venous stasis dermatitis. 2+ DP pulse, lateral open wound limited to skin breakdown. Dry flaky skin. Right lower extremity: Venous stasis dermatitis. Difficult to palpate pulses. Medial open wound limited to skin breakdown. Dry flaky skin. 2+ pitting edema bilaterally to the knees Electronic Signature(s) Signed: 03/26/2021 4:25:40 PM By: Allen Shan DO Entered By: [name] on 03/26/2021 15:51:31 -------------------------------------------------------------------------------- Physician Orders Details Patient Name: Date of Service: [name], [name] Allen L. 03/26/2021 1:15 PM Medical Record Number: [id number] Patient Account Number: [id number] Date of Birth/Sex: Treating RN: 28-Mar-1951 (70 y.o. Male) [name] Primary Care Sheyanne Munley: PA [name], NO Other Clinician: Referring Irma Roulhac: Treating [name]/Extender: [name], SHA [name] in Treatment: 0 Verbal / Phone Orders: No Diagnosis Coding ICD-10 Coding Code Description L97.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 with unspecified severity L97.829 Non-pressure chronic ulcer of  other part of left lower leg with unspecified severity I89.0 Lymphedema, not elsewhere classified I87.2 Venous insufficiency (chronic) (peripheral) Follow-up Appointments Return Appointment in 1 week. Bathing/ Shower/ Hygiene May shower with protection but do not get wound dressing(s) wet. - You can use cast protectors...they are available at [facility], etc. Edema Control - Lymphedema / SCD / Other Elevate legs  to the level of the heart or above for 30 minutes daily and/or when sitting, a frequency of: Avoid standing for long periods of time. Exercise regularly Other Edema Control Orders/Instructions: - order juxtalites for pt. for BLE. Once healed, pt. will wear juxtalites on both legs applying in the morning and removing at night. Wound Treatment Wound #1 - Lower Leg Wound Laterality: Right, Medial Cleanser: Soap and Water 1 x Per Week Discharge Instructions: May shower and wash wound with dial antibacterial soap and water prior to dressing change. Cleanser: Wound Cleanser 1 x Per Week Discharge Instructions: Cleanse the wound with wound cleanser prior to applying a clean dressing using gauze sponges, not tissue or cotton balls. Peri-Wound Care: Triamcinolone 15 (g) 1 x Per Week Discharge Instructions: Use triamcinolone 15 (g) as directed Peri-Wound Care: Sween Lotion (Moisturizing lotion) 1 x Per Week Discharge Instructions: Apply moisturizing lotion as directed Prim Dressing: KerraCel Ag Gelling Fiber Dressing, 4x5 in (silver alginate) 1 x Per Week ary Discharge Instructions: Apply silver alginate to wound bed as instructed Secondary Dressing: Woven Gauze Sponge, Non-Sterile 4x4 in 1 x Per Week Discharge Instructions: Apply over primary dressing as directed. Compression Wrap: Kerlix Roll 4.5x3.1 (in/yd) 1 x Per Week Discharge Instructions: Apply Kerlix and Coban compression as directed. Compression Wrap: Coban Self-Adherent Wrap 4x5 (in/yd) 1 x Per Week Discharge  Instructions: Apply over Kerlix as directed. Wound #2 - Lower Leg Wound Laterality: Left, Anterior Cleanser: Soap and Water 1 x Per Week Discharge Instructions: May shower and wash wound with dial antibacterial soap and water prior to dressing change. Cleanser: Wound Cleanser 1 x Per Week Discharge Instructions: Cleanse the wound with wound cleanser prior to applying a clean dressing using gauze sponges, not tissue or cotton balls. Prim Dressing: KerraCel Ag Gelling Fiber Dressing, 4x5 in (silver alginate) 1 x Per Week ary Discharge Instructions: Apply silver alginate to wound bed as instructed Secondary Dressing: Woven Gauze Sponge, Non-Sterile 4x4 in 1 x Per Week Discharge Instructions: Apply over primary dressing as directed. Compression Wrap: Kerlix Roll 4.5x3.1 (in/yd) 1 x Per Week Discharge Instructions: Apply Kerlix and Coban compression as directed. Compression Wrap: Coban Self-Adherent Wrap 4x5 (in/yd) 1 x Per Week Discharge Instructions: Apply over Kerlix as directed. Services and Therapies Ankle Brachial Index (ABI) Electronic Signature(s) Signed: 03/26/2021 4:25:40 PM By: Allen Shan DO Entered By: [name] on 03/26/2021 15:52:00 -------------------------------------------------------------------------------- Problem List Details Patient Name: Date of Service: [name], [name] Allen L. 03/26/2021 1:15 PM Medical Record Number: [id number] Patient Account Number: [id number] Date of Birth/Sex: Treating RN: 01-14-1951 (70 y.o. 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 with unspecified 03/26/2021 No Yes severity L97.829 Non-pressure chronic ulcer of other part of left lower leg with  unspecified 03/26/2021 No Yes severity I89.0 Lymphedema, not elsewhere classified 03/26/2021 No Yes I87.2 Venous insufficiency (chronic) (peripheral) 03/26/2021 No Yes Inactive Problems Resolved Problems Electronic Signature(s) Signed: 03/26/2021 4:25:40 PM By: Allen Shan DO Entered By: [name] on 03/26/2021 15:45:11 -------------------------------------------------------------------------------- Progress Note Details Patient Name: Date of Service: [name], [name] Allen L. 03/26/2021 1:15 PM Medical Record Number: [id number] Patient Account Number: [id number] Date of Birth/Sex: Treating RN: Sep 30, 1951 (70 y.o. Male) [name] Primary Care Samara Stankowski: PA [name], [place] Other Clinician: Referring Eliza Grissinger: Treating Cordell Guercio/Extender: [name]  Katrina Stack, SHA [name] in Treatment: 0 Subjective Chief Complaint Information obtained from Patient bilateral lower extremity swelling with wounds History of Present Illness (HPI) Kazuki Ingle is a 70 year old male with a past medical history of type 2 diabetes and essential hypertension who presents to the clinic today for bilateral lower extremity wounds. He was recently seen in the ED on 3/9 for bilateral lower extremity edema. He had a DVT study that was negative for clots. He was given triamcinolone cream and advised to elevate legs and obtain compression stockings. He has been using the triamcinolone cream however he is unable to use compression stockings due to difficulty of putting on. Patient states that for the past 2 years he has had opening and closing of weeping wounds to his legs bilaterally. They spontaneously open and close. They are often open for several Reese before they heal spontaneously. He denies any purulent drainage, increased warmth or increased erythema to the skin. Patient History Unable to Obtain Patient History due to Altered Mental Status. Information obtained from Patient. Allergies No Known  Allergies Family History Cancer - Mother,Maternal Grandparents, Heart Disease - Maternal Grandparents, Hypertension - Maternal Grandparents, No family history of Diabetes, Hereditary Spherocytosis. Social History Former smoker - ended on 09/14/1983, Alcohol Use - Rarely, Drug Use - No History, Caffeine Use - Never. Medical History Eyes Patient has history of Cataracts - 2014 Cardiovascular Patient has history of Arrhythmia - A-Fib Endocrine Patient has history of Type II Diabetes Musculoskeletal Patient has history of Osteoarthritis Patient is treated with Oral Agents. Blood sugar is tested. Medical A Surgical History Notes nd Hematologic/Lymphatic Hyperbilirubinemia Gastrointestinal Rectal Pain-Constipation Genitourinary CKD stage 3-BPH Oncologic Colon Cancer-Colon Re-section Review of Systems (ROS) Constitutional Symptoms (General Health) Denies complaints or symptoms of Fatigue, Fever, Chills, Marked Weight Change. Ear/Nose/Mouth/Throat Denies complaints or symptoms of Chronic sinus problems or rhinitis. Respiratory Denies complaints or symptoms of Chronic or frequent coughs, Shortness of Breath. Musculoskeletal Complains or has symptoms of Muscle Weakness. Neurologic Denies complaints or symptoms of Numbness/parasthesias. Psychiatric Denies complaints or symptoms of Claustrophobia, Suicidal. Objective Constitutional respirations regular, non-labored and within target range for patient.. Vitals Time Taken: 1:40 PM, Temperature: 98.4 F, Pulse: 60 bpm, Respiratory Rate: 18 breaths/min, Blood Pressure: 120/62 mmHg, Capillary Blood Glucose: 87 mg/dl. General Notes: Left lower extremity: Venous stasis dermatitis. 2+ DP pulse, lateral open wound limited to skin breakdown. Dry flaky skin. Right lower extremity: Venous stasis dermatitis. Difficult to palpate pulses. Medial open wound limited to skin breakdown. Dry flaky skin. 2+ pitting edema bilaterally to  the knees Integumentary (Hair, Skin) Wound #1 status is Open. Original cause of wound was Gradually Appeared. The date acquired was: 02/18/2021. The wound is located on the Right,Medial Lower Leg. The wound measures 2cm length x 1cm width x 0.1cm depth; 1.571cm^2 area and 0.157cm^3 volume. There is Fat Layer (Subcutaneous Tissue) exposed. There is no tunneling or undermining noted. There is a medium amount of serous drainage noted. The wound margin is distinct with the outline attached to the wound base. There is medium (34-66%) red granulation within the wound bed. There is no necrotic tissue within the wound bed. Wound #2 status is Open. Original cause of wound was Gradually Appeared. The date acquired was: 02/18/2021. The wound is located on the Left,Anterior Lower Leg. The wound measures 2.5cm length x 1.3cm width x 0.1cm depth; 2.553cm^2 area and 0.255cm^3 volume. There is Fat Layer (Subcutaneous Tissue) exposed. There is no tunneling or undermining noted. There is a medium amount  of serous drainage noted. The wound margin is distinct with the outline attached to the wound base. There is medium (34-66%) red granulation within the wound bed. There is no necrotic tissue within the wound bed. 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 I was able to palpate a strong pulse on the left foot however unable to on the right side. Doppler signal showed strong pulses bilaterally. I would like to obtain formal studies. Patient appears to have a mixed picture of lymphedema and chronic venous insufficiency due to appearance of his legs. Once ruling out arterial insufficiency I think he would benefit  from 4-layer compression and lymphedema pumps. We will go ahead and try and order juxta lites since he cannot use traditional compression stockings. Will wrap with Kerlix and Coban with silver alginate underneath. Will need follow-up in 1 week Plan Follow-up Appointments: Return Appointment in 1 week. Bathing/ Shower/ Hygiene: May shower with protection but do not get wound dressing(s) wet. - You can use cast protectors...they are available at [facility], etc. Edema Control - Lymphedema / SCD / Other: Elevate legs to the level of the heart or above for 30 minutes daily and/or when sitting, a frequency of: Avoid standing for long periods of time. Exercise regularly Other Edema Control Orders/Instructions: - order juxtalites for pt. for BLE. Once healed, pt. will wear juxtalites on both legs applying in the morning and removing at night. Services and Therapies ordered were: Ankle Brachial Index (ABI) WOUND #1: - Lower Leg Wound Laterality: Right, Medial Cleanser: Soap and Water 1 x Per Week/ Discharge Instructions: May shower and wash wound with dial antibacterial soap and water prior to dressing change. Cleanser: Wound Cleanser 1 x Per Week/ Discharge Instructions: Cleanse the wound with wound cleanser prior to applying a clean dressing using gauze sponges, not tissue or cotton balls. Peri-Wound Care: Triamcinolone 15 (g) 1 x Per Week/ Discharge Instructions: Use triamcinolone 15 (g) as directed Peri-Wound Care: Sween Lotion (Moisturizing lotion) 1 x Per Week/ Discharge Instructions: Apply moisturizing lotion as directed Prim Dressing: KerraCel Ag Gelling Fiber Dressing, 4x5 in (silver alginate) 1 x Per Week/ ary Discharge Instructions: Apply silver alginate to wound bed as instructed Secondary Dressing: Woven Gauze Sponge, Non-Sterile 4x4 in 1 x Per Week/ Discharge Instructions: Apply over primary dressing as directed. Com pression Wrap: Kerlix Roll 4.5x3.1 (in/yd) 1 x Per  Week/ Discharge Instructions: Apply Kerlix and Coban compression as directed. Com pression Wrap: Coban Self-Adherent Wrap 4x5 (in/yd) 1 x Per Week/ Discharge Instructions: Apply over Kerlix as directed. WOUND #2: - Lower Leg Wound Laterality: Left, Anterior Cleanser: Soap and Water 1 x Per Week/ Discharge Instructions: May shower and wash wound with dial antibacterial soap and water prior to dressing change. Cleanser: Wound Cleanser 1 x Per Week/ Discharge Instructions: Cleanse the wound with wound cleanser prior to applying a clean dressing using gauze sponges, not tissue or cotton balls. Prim Dressing: KerraCel Ag Gelling Fiber Dressing, 4x5 in (silver alginate) 1 x Per Week/ ary Discharge Instructions: Apply silver alginate to wound bed as instructed Secondary Dressing: Woven Gauze Sponge, Non-Sterile 4x4 in 1 x Per Week/ Discharge Instructions: Apply over primary dressing as directed. Com pression Wrap: Kerlix Roll 4.5x3.1 (in/yd) 1 x Per Week/ Discharge Instructions: Apply  Kerlix and Coban compression as directed. Com pression Wrap: Coban Self-Adherent Wrap 4x5 (in/yd) 1 x Per Week/ Discharge Instructions: Apply over Kerlix as directed. 1. Coban/kerlix, silver alginate 2. follow up in one week 3. order juxtalites Electronic Signature(s) Signed: 03/26/2021 4:25:40 PM By: Allen Shan DO Entered By: [name] on 03/26/2021 15:56:15 -------------------------------------------------------------------------------- HxROS Details Patient Name: Date of Service: [name], [name] Allen L. 03/26/2021 1:15 PM Medical Record Number: [id number] Patient Account Number: [id number] Date of Birth/Sex: Treating RN: 18-Apr-1951 (70 y.o. Male) Lorrin Jackson Primary Care Seryna Marek: PA [name], NO Other Clinician: Referring Romelle Muldoon: Treating Imajean Mcdermid/Extender: [name], SHA [name] in Treatment: 0 Unable to Obtain Patient History due to Altered Mental Status Information Obtained  From Patient Constitutional Symptoms (General Health) Complaints and Symptoms: Negative for: Fatigue; Fever; Chills; Marked Weight Change Ear/Nose/Mouth/Throat Complaints and Symptoms: Negative for: Chronic sinus problems or rhinitis Respiratory Complaints and Symptoms: Negative for: Chronic or frequent coughs; Shortness of Breath Musculoskeletal Complaints and Symptoms: Positive for: Muscle Weakness Medical History: Positive for: Osteoarthritis Neurologic Complaints and Symptoms: Negative for: Numbness/parasthesias Psychiatric Complaints and Symptoms: Negative for: Claustrophobia; Suicidal Eyes Medical History: Positive for: Cataracts - 2014 Hematologic/Lymphatic Medical History: Past Medical History Notes: Hyperbilirubinemia Cardiovascular Medical History: Positive for: Arrhythmia - A-Fib Gastrointestinal Medical History: Past Medical History Notes: Rectal Pain-Constipation Endocrine Medical History: Positive for: Type II Diabetes Time with diabetes: Since 2014 Treated with: Oral agents Blood sugar tested every day: Yes Tested : Genitourinary Medical History: Past Medical History Notes: CKD stage 3-BPH Immunological Oncologic Medical History: Past Medical History Notes: Colon Cancer-Colon Re-section HBO Extended History Items Eyes: Cataracts Immunizations Pneumococcal Vaccine: Received Pneumococcal Vaccination: No Implantable Devices None Family and Social History Cancer: Yes - Mother,Maternal Grandparents; Diabetes: No; Heart Disease: Yes - Maternal Grandparents; Hereditary Spherocytosis: No; Hypertension: Yes - Maternal Grandparents; Former smoker - ended on 09/14/1983; Alcohol Use: Rarely; Drug Use: No History; Caffeine Use: Never; Financial Concerns: No; Food, Clothing or Shelter Needs: No; Support System Lacking: No; Transportation Concerns: No Electronic Signature(s) Signed: 03/26/2021 4:25:40 PM By: Allen Shan DO Signed: 03/26/2021 6:07:55  PM By: Lorrin Jackson Entered By: Lorrin Jackson on 03/26/2021 13:57:09 -------------------------------------------------------------------------------- [place] Details Patient Name: Date of Service: [name], [name] Allen L. 03/26/2021 Medical Record Number: [id number] Patient Account Number: [id number] Date of Birth/Sex: Treating RN: [birth year] (70 y.o. Male) [name] Primary Care Moua Rasmusson: PA [name], NO Other Clinician: Referring D[employer]elle [employer]: Treating Emett Stapel/Extender: [name], SHA [name] in Treatment: 0 Diagnosis Coding ICD-10 Codes Code Description [id number] Non-pressure chronic ulcer of other part of right lower leg with unspecified severity L97.829 Non-pressure chronic ulcer of other part of left lower leg with unspecified severity I89.0 Lymphedema, not elsewhere classified I87.2 Venous insufficiency (chronic) (peripheral) Facility Procedures CPT4 Code: [id number] Description: 93716 - WOUND CARE VISIT-LEV 4 NEW PT Modifier: Quantity: 1 Electronic Signature(s) Signed: 03/26/2021 4:25:40 PM By: Allen Shan DO Entered By: [name] on 03/26/2021 16:25:00

## 2021-03-31 NOTE — Progress Notes (Signed)
HARVIR, PATRY ([id number]) Visit Report for 03/26/2021 Allergy List Details Patient Name: Date of Service: [name], Allen [name] 03/26/2021 1:15 PM Medical Record Number: [id number] Patient Account Number: [id number] Date of Birth/Sex: Treating RN: 1951-08-05 (70 y.o. Male) Lorrin Jackson Primary Care [name]: PA [name], [place] Other Clinician: Referring [name]: Treating [name]/Extender: Pollyann Glen, SHA WN Weeks in Treatment: 0 Allergies Active Allergies No Known Allergies Allergy Notes Electronic Signature(s) Signed: 03/26/2021 6:07:55 PM By: Lorrin Jackson Entered By: Lorrin Jackson on 03/26/2021 13:47:30 -------------------------------------------------------------------------------- Arrival Information Details Patient Name: Date of Service: [name], Allen [name] 03/26/2021 1:15 PM Medical Record Number: [id number] Patient Account Number: [id number] Date of Birth/Sex: Treating RN: 10/06/1951 (70 y.o. Male) Lorrin Jackson Primary Care Shya Kovatch: PA [name], NO Other Clinician: Referring Taquita Demby: Treating Kanya Potteiger/Extender: Pollyann Glen, SHA WN Weeks in Treatment: 0 Visit Information Patient Arrived: Ambulatory Arrival Time: 13:34 Transfer Assistance: None Patient Identification Verified: Yes Secondary Verification Process Completed: Yes Patient Requires Transmission-Based Precautions: No Patient Has Alerts: Yes Patient Alerts: Patient on Blood Thinner R ABI=Non compressible Electronic Signature(s) Signed: 03/26/2021 6:07:55 PM By: Lorrin Jackson Entered By: Lorrin Jackson on 03/26/2021 14:20:00 -------------------------------------------------------------------------------- Clinic Level of Care Assessment Details Patient Name: Date of Service: Allen Hartigan [name] 03/26/2021 1:15 PM Medical Record Number: [id number] Patient Account Number: [id number] Date of Birth/Sex: Treating RN: [birth year] (70 y.o. Male) [name] Primary Care Nasira Janusz: PA  [name], NO Other Clinician: Referring Blayke Pinera: Treating Tenesia Escudero/Extender: Pollyann Glen, SHA WN Weeks in Treatment: 0 Clinic Level of Care Assessment Items TOOL 1 Quantity Score X- 1 0 Use when EandM and Procedure is performed on INITIAL visit ASSESSMENTS - Nursing Assessment / Reassessment X- 1 20 General Physical Exam (combine w/ comprehensive assessment (listed just below) when performed on new pt. evals) X- 1 25 Comprehensive Assessment (HX, ROS, Risk Assessments, Wounds Hx, etc.) ASSESSMENTS - Wound and Skin Assessment / Reassessment X- 1 10 Dermatologic / Skin Assessment (not related to wound area) ASSESSMENTS - Ostomy and/or Continence Assessment and Care []  - 0 Incontinence Assessment and Management []  - 0 Ostomy Care Assessment and Management (repouching, etc.) PROCESS - Coordination of Care X - Simple Patient / Family Education for ongoing care 1 15 []  - 0 Complex (extensive) Patient / Family Education for ongoing care X- 1 10 Staff obtains Programmer, systems, Records, T Results / Process Orders est X- 1 10 Staff telephones HHA, Nursing Homes / Clarify orders / etc []  - 0 Routine Transfer to another Facility (non-emergent condition) []  - 0 Routine Hospital Admission (non-emergent condition) X- 1 15 New Admissions / Biomedical engineer / Ordering NPWT Apligraf, etc. , []  - 0 Emergency Hospital Admission (emergent condition) PROCESS - Special Needs []  - 0 Pediatric / Minor Patient Management []  - 0 Isolation Patient Management []  - 0 Hearing / Language / Visual special needs []  - 0 Assessment of Community assistance (transportation, D/C planning, etc.) []  - 0 Additional assistance / Altered mentation []  - 0 Support Surface(s) Assessment (bed, cushion, seat, etc.) INTERVENTIONS - Miscellaneous []  - 0 External ear exam []  - 0 Patient Transfer (multiple staff / Civil Service fast streamer / Similar devices) []  - 0 Simple Staple / Suture removal (25 or less) []  -  0 Complex Staple / Suture removal (26 or more) []  - 0 Hypo/Hyperglycemic Management (do not check if billed separately) X- 1 15 Ankle / Brachial Index (ABI) - do not check if billed separately Has the patient been seen at  the hospital within the last three years: Yes Total Score: 120 Level Of Care: New/Established - Level 4 Electronic Signature(s) Signed: 03/26/2021 5:45:48 PM By: [name] RN Entered By: [name] on 03/26/2021 15:03:31 -------------------------------------------------------------------------------- Encounter Discharge Information Details Patient Name: Date of Service: [name], Allen [name] 03/26/2021 1:15 PM Medical Record Number: [id number] Patient Account Number: [id number] Date of Birth/Sex: Treating RN: June 21, 1951 (70 y.o. Male) Levan Hurst Primary Care Vern Guerette: PA [name], NO Other Clinician: Referring Emmanuela Ghazi: Treating Claud Gowan/Extender: Pollyann Glen, SHA WN Weeks in Treatment: 0 Encounter Discharge Information Items Discharge Condition: Stable Ambulatory Status: Ambulatory Discharge Destination: Home Transportation: Private Auto Accompanied By: alone Schedule Follow-up Appointment: Yes Clinical Summary of Care: Patient Declined Electronic Signature(s) Signed: 03/26/2021 6:17:09 PM By: Levan Hurst RN, BSN Entered By: Levan Hurst on 03/26/2021 18:06:49 -------------------------------------------------------------------------------- Lower Extremity Assessment Details Patient Name: Date of Service: [name], Allen [name] 03/26/2021 1:15 PM Medical Record Number: [id number] Patient Account Number: [id number] Date of Birth/Sex: Treating RN: 1951/03/04 (70 y.o. Male) Lorrin Jackson Primary Care Presten Joost: PA [name], NO Other Clinician: Referring Luvinia Lucy: Treating Josejulian Tarango/Extender: Pollyann Glen, SHA WN Weeks in Treatment: 0 Edema Assessment Assessed: [Left: Yes] [Right: Yes] Edema: [Left: Yes] [Right:  Yes] Calf Left: Right: Point of Measurement: 39 cm From Medial Instep 46.8 cm 48 cm Ankle Left: Right: Point of Measurement: 13 cm From Medial Instep 29 cm 29 cm Vascular Assessment Pulses: Dorsalis Pedis Palpable: [Left:Yes] [Right:Yes] Blood Pressure: Brachial: [Left:120] Ankle: [Left:Dorsalis Pedis: 170 1.42] Notes Right ABI=Non Compr Electronic Signature(s) Signed: 03/26/2021 6:07:55 PM By: Lorrin Jackson Entered By: Lorrin Jackson on 03/26/2021 14:19:37 -------------------------------------------------------------------------------- Multi Wound Chart Details Patient Name: Date of Service: [name], Allen [name] 03/26/2021 1:15 PM Medical Record Number: [id number] Patient Account Number: [id number] Date of Birth/Sex: Treating RN: 28-Sep-1951 (70 y.o. Male) [name] Primary Care Jayshawn Colston: PA [name], NO Other Clinician: Referring Ninette Cotta: Treating Casy Tavano/Extender: Pollyann Glen, SHA WN Weeks in Treatment: 0 Vital Signs Height(in): Capillary Blood Glucose(mg/dl): 87 Weight(lbs): Pulse(bpm): 60 Body Mass Index(BMI): Blood Pressure(mmHg): 120/62 Temperature(F): 98.4 Respiratory Rate(breaths/min): 18 Photos: [1:No Photos Right, Medial Lower Leg] [2:No Photos Left, Anterior Lower Leg] [N/A:N/A N/A] Wound Location: [1:Gradually Appeared] [2:Gradually Appeared] [N/A:N/A] Wounding Event: [1:Lymphedema] [2:Lymphedema] [N/A:N/A] Primary Etiology: [1:Cataracts, Arrhythmia, Type II] [2:Cataracts, Arrhythmia, Type II] [N/A:N/A] Comorbid History: [1:Diabetes, Osteoarthritis 02/18/2021] [2:Diabetes, Osteoarthritis 02/18/2021] [N/A:N/A] Date Acquired: [1:0] [2:0] [N/A:N/A] Weeks of Treatment: [1:Open] [2:Open] [N/A:N/A] Wound Status: [1:2x1x0.1] [2:2.5x1.3x0.1] [N/A:N/A] Measurements L x W x D (cm) [1:1.571] [2:2.553] [N/A:N/A] A (cm) : rea [1:0.157] [2:0.255] [N/A:N/A] Volume (cm) : [1:Full Thickness Without Exposed] [2:Full Thickness Without Exposed]  [N/A:N/A] Classification: [1:Support Structures Medium] [2:Support Structures Medium] [N/A:N/A] Exudate Amount: [1:Serous] [2:Serous] [N/A:N/A] Exudate Type: [1:amber] [2:amber] [N/A:N/A] Exudate Color: [1:Distinct, outline attached] [2:Distinct, outline attached] [N/A:N/A] Wound Margin: [1:Medium (34-66%)] [2:Medium (34-66%)] [N/A:N/A] Granulation Amount: [1:Red] [2:Red] [N/A:N/A] Granulation Quality: [1:None Present (0%)] [2:None Present (0%)] [N/A:N/A] Necrotic Amount: [1:Fat Layer (Subcutaneous Tissue): Yes Fat Layer (Subcutaneous Tissue): Yes N/A] Exposed Structures: [1:Fascia: No Tendon: No Muscle: No Joint: No Bone: No None] [2:Fascia: No Tendon: No Muscle: No Joint: No Bone: No None] [N/A:N/A] Treatment Notes Electronic Signature(s) Signed: 03/26/2021 4:25:40 PM By: Kalman Shan DO Signed: 03/26/2021 5:45:48 PM By: [name] RN Entered By: Kalman Shan on 03/26/2021 15:45:19 -------------------------------------------------------------------------------- Multi-Disciplinary Care Plan Details Patient Name: Date of Service: [name], Allen [name] 03/26/2021 1:15 PM Medical Record Number: [id number] Patient Account Number: [id number] Date of Birth/Sex: Treating  RN: Jun 02, 1951 (70 y.o. Male) [name] Primary Care Etter Royall: Other Clinician: PA Darnelle Spangle Referring Quashawn Jewkes: Treating Marquavion Venhuizen/Extender: Pollyann Glen, SHA WN Weeks in Treatment: 0 Active Inactive Orientation to the Wound Care Program Nursing Diagnoses: Knowledge deficit related to the wound healing center program Goals: Patient/caregiver will verbalize understanding of the [facility] Date Initiated: 03/26/2021 Target Resolution Date: 04/15/2021 Goal Status: Active Interventions: Provide education on orientation to the wound center Notes: Wound/Skin Impairment Nursing Diagnoses: Impaired tissue integrity Knowledge deficit related to ulceration/compromised skin  integrity Goals: Patient will have a decrease in wound volume by X% from date: (specify in notes) Date Initiated: 03/26/2021 Target Resolution Date: 04/15/2021 Goal Status: Active Patient/caregiver will verbalize understanding of skin care regimen Date Initiated: 03/26/2021 Target Resolution Date: 04/18/2021 Goal Status: Active Ulcer/skin breakdown will have a volume reduction of 30% by week 4 Date Initiated: 03/26/2021 Target Resolution Date: 04/18/2021 Goal Status: Active Interventions: Assess patient/caregiver ability to obtain necessary supplies Assess patient/caregiver ability to perform ulcer/skin care regimen upon admission and as needed Assess ulceration(s) every visit Notes: Electronic Signature(s) Signed: 03/26/2021 5:45:48 PM By: [name] RN Entered By: [name] on 03/26/2021 14:51:17 -------------------------------------------------------------------------------- Pain Assessment Details Patient Name: Date of Service: [name], Allen [name] 03/26/2021 1:15 PM Medical Record Number: [id number] Patient Account Number: [id number] Date of Birth/Sex: Treating RN: 08-17-1951 (70 y.o. Male) Lorrin Jackson Primary Care Dallon Dacosta: PA [name], NO Other Clinician: Referring Reather Steller: Treating Dariel Betzer/Extender: Pollyann Glen, SHA WN Weeks in Treatment: 0 Active Problems Location of Pain Severity and Description of Pain Patient Has Paino No Site Locations Pain Management and Medication Current Pain Management: Notes Denies Pain Electronic Signature(s) Signed: 03/26/2021 6:07:55 PM By: Lorrin Jackson Entered By: Lorrin Jackson on 03/26/2021 13:59:43 -------------------------------------------------------------------------------- Patient/Caregiver Education Details Patient Name: Date of Service: [name], Allen [name] 4/14/2022andnbsp1:15 PM Medical Record Number: [id number] Patient Account Number: [id number] Date of Birth/Gender: Treating RN: November 05, 1951 (70  y.o. Male) [name] Primary Care Physician: PA [name], [place] Other Clinician: Referring Physician: Treating Physician/Extender: Pollyann Glen, SHA WN Weeks in Treatment: 0 Education Assessment Education Provided To: Patient Education Topics Provided Welcome T [facility]: o Methods: Explain/Verbal Responses: State content correctly Electronic Signature(s) Signed: 03/26/2021 5:45:48 PM By: [name] RN Entered By: [name] on 03/26/2021 14:51:27 -------------------------------------------------------------------------------- Wound Assessment Details Patient Name: Date of Service: [name], Allen [name] 03/26/2021 1:15 PM Medical Record Number: [id number] Patient Account Number: [id number] Date of Birth/Sex: Treating RN: 01-31-1951 (70 y.o. Male) Lorrin Jackson Primary Care Chontel Warning: Other Clinician: PA [name], [place] Referring Kie Calvin: Treating Render Marley/Extender: Pollyann Glen, SHA WN Weeks in Treatment: 0 Wound Status Wound Number: 1 Primary Etiology: Lymphedema Wound Location: Right, Medial Lower Leg Wound Status: Open Wounding Event: Gradually Appeared Comorbid History: Cataracts, Arrhythmia, Type II Diabetes, Osteoarthritis Date Acquired: 02/18/2021 Weeks Of Treatment: 0 Clustered Wound: No Photos Wound Measurements Length: (cm) 2 Width: (cm) 1 Depth: (cm) 0.1 Area: (cm) 1.571 Volume: (cm) 0.157 % Reduction in Area: 0% % Reduction in Volume: 0% Epithelialization: None Tunneling: No Undermining: No Wound Description Classification: Full Thickness Without Exposed Support Structures Wound Margin: Distinct, outline attached Exudate Amount: Medium Exudate Type: Serous Exudate Color: amber Foul Odor After Cleansing: No Slough/Fibrino No Wound Bed Granulation Amount: Medium (34-66%) Exposed Structure Granulation Quality: Red Fascia Exposed: No Necrotic Amount: None Present (0%) Fat Layer (Subcutaneous Tissue)  Exposed: Yes Tendon Exposed: No Muscle Exposed: No Joint Exposed: No Bone  Exposed: No Treatment Notes Wound #1 (Lower Leg) Wound Laterality: Right, Medial Cleanser Soap and Water Discharge Instruction: May shower and wash wound with dial antibacterial soap and water prior to dressing change. Wound Cleanser Discharge Instruction: Cleanse the wound with wound cleanser prior to applying a clean dressing using gauze sponges, not tissue or cotton balls. Peri-Wound Care Triamcinolone 15 (g) Discharge Instruction: Use triamcinolone 15 (g) as directed Sween Lotion (Moisturizing lotion) Discharge Instruction: Apply moisturizing lotion as directed Topical Primary Dressing KerraCel Ag Gelling Fiber Dressing, 4x5 in (silver alginate) Discharge Instruction: Apply silver alginate to wound bed as instructed Secondary Dressing Woven Gauze Sponge, Non-Sterile 4x4 in Discharge Instruction: Apply over primary dressing as directed. Secured With Compression Wrap Kerlix Roll 4.5x3.1 (in/yd) Discharge Instruction: Apply Kerlix and Coban compression as directed. Coban Self-Adherent Wrap 4x5 (in/yd) Discharge Instruction: Apply over Kerlix as directed. Compression Stockings Add-Ons Electronic Signature(s) Signed: 03/26/2021 6:07:55 PM By: Lorrin Jackson Signed: 03/31/2021 8:07:32 AM By: [name] Entered By: [name] on 03/26/2021 16:55:21 -------------------------------------------------------------------------------- Wound Assessment Details Patient Name: Date of Service: [name], Allen [name] 03/26/2021 1:15 PM Medical Record Number: [id number] Patient Account Number: [id number] Date of Birth/Sex: Treating RN: 11-Mar-1951 (70 y.o. Male) Lorrin Jackson Primary Care Benino Korinek: PA [name], [place] Other Clinician: Referring Marlin Jarrard: Treating Eden Rho/Extender: Pollyann Glen, SHA WN Weeks in Treatment: 0 Wound Status Wound Number: 2 Primary Etiology: Lymphedema Wound Location:  Left, Anterior Lower Leg Wound Status: Open Wounding Event: Gradually Appeared Comorbid History: Cataracts, Arrhythmia, Type II Diabetes, Osteoarthritis Date Acquired: 02/18/2021 Weeks Of Treatment: 0 Clustered Wound: No Photos Wound Measurements Length: (cm) 2.5 Width: (cm) 1.3 Depth: (cm) 0.1 Area: (cm) 2.553 Volume: (cm) 0.255 % Reduction in Area: 0% % Reduction in Volume: 0% Epithelialization: None Tunneling: No Undermining: No Wound Description Classification: Full Thickness Without Exposed Support Structures Wound Margin: Distinct, outline attached Exudate Amount: Medium Exudate Type: Serous Exudate Color: amber Foul Odor After Cleansing: No Slough/Fibrino No Wound Bed Granulation Amount: Medium (34-66%) Exposed Structure Granulation Quality: Red Fascia Exposed: No Necrotic Amount: None Present (0%) Fat Layer (Subcutaneous Tissue) Exposed: Yes Tendon Exposed: No Muscle Exposed: No Joint Exposed: No Bone Exposed: No Treatment Notes Wound #2 (Lower Leg) Wound Laterality: Left, Anterior Cleanser Soap and Water Discharge Instruction: May shower and wash wound with dial antibacterial soap and water prior to dressing change. Wound Cleanser Discharge Instruction: Cleanse the wound with wound cleanser prior to applying a clean dressing using gauze sponges, not tissue or cotton balls. Peri-Wound Care Topical Primary Dressing KerraCel Ag Gelling Fiber Dressing, 4x5 in (silver alginate) Discharge Instruction: Apply silver alginate to wound bed as instructed Secondary Dressing Woven Gauze Sponge, Non-Sterile 4x4 in Discharge Instruction: Apply over primary dressing as directed. Secured With Compression Wrap Kerlix Roll 4.5x3.1 (in/yd) Discharge Instruction: Apply Kerlix and Coban compression as directed. Coban Self-Adherent Wrap 4x5 (in/yd) Discharge Instruction: Apply over Kerlix as directed. Compression Stockings Add-Ons Electronic Signature(s) Signed:  03/26/2021 6:07:55 PM By: Lorrin Jackson Signed: 03/31/2021 8:07:32 AM By: [name] Entered By: [name] on 03/26/2021 16:55:41 -------------------------------------------------------------------------------- Vitals Details Patient Name: Date of Service: [name], Allen [name] 03/26/2021 1:15 PM Medical Record Number: [id number] Patient Account Number: [id number] Date of Birth/Sex: Treating RN: 07/21/1951 (70 y.o. Male) Lorrin Jackson Primary Care Livan Hires: PA [name], [place] Other Clinician: Referring Maricus Tanzi: Treating Shalissa Easterwood/Extender: Pollyann Glen, SHA WN Weeks in Treatment: 0 Vital Signs Time Taken: 13:40 Temperature (F): 98.4 Pulse (bpm): 60 Respiratory Rate (breaths/min): 18 Blood Pressure (  mmHg): 120/62 Capillary Blood Glucose (mg/dl): 87 Reference Range: 80 - 120 mg / dl Electronic Signature(s) Signed: 03/26/2021 6:07:55 PM By: Lorrin Jackson Signed: 03/26/2021 6:07:55 PM By: Lorrin Jackson Entered By: Lorrin Jackson on 03/26/2021 13:47:19

## 2021-04-02 ENCOUNTER — Other Ambulatory Visit: Payer: Self-pay

## 2021-04-02 ENCOUNTER — Encounter (HOSPITAL_BASED_OUTPATIENT_CLINIC_OR_DEPARTMENT_OTHER): Payer: Medicare Other | Admitting: Internal Medicine

## 2021-04-02 DIAGNOSIS — E11622 Type 2 diabetes mellitus with other skin ulcer: Secondary | ICD-10-CM | POA: Diagnosis not present

## 2021-04-02 DIAGNOSIS — L97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 with unspecified severity: Secondary | ICD-10-CM | POA: Diagnosis not present

## 2021-04-02 DIAGNOSIS — L97829 Non-pressure chronic ulcer of other part of left lower leg with unspecified severity: Secondary | ICD-10-CM | POA: Diagnosis not present

## 2021-04-02 NOTE — Progress Notes (Signed)
ARGUS, CARAHER ([id number]) Visit Report for 04/02/2021 Chief Complaint Document Details Patient Name: Date of Service: [name] Allen L. 04/02/2021 10:30 A M Medical Record Number: [id number] Patient Account Number: [id number] Date of Birth/Sex: Treating RN: 02/11/1951 (70 y.o. [name] Primary Care Provider: PA [name], NO Other Clinician: Referring Provider: Treating Provider/Extender: [name]: 1 Information Obtained from: Patient Chief Complaint bilateral lower extremity swelling with wounds Electronic Signature(s) Signed: 04/02/2021 12:22:19 PM By: Kalman Shan DO Entered By: Kalman Shan on 04/02/2021 11:30:13 -------------------------------------------------------------------------------- Debridement Details Patient Name: Date of Service: [name], [name] 04/02/2021 10:30 A M Medical Record Number: [id number] Patient Account Number: [id number] Date of Birth/Sex: Treating RN: October 24, 1951 (70 y.o. [name] Primary Care Provider: PA TIENT, NO Other Clinician: Referring Provider: Treating Provider/Extender: [name]: 1 Debridement Performed for Assessment: Wound #2 Left,Anterior Lower Leg Performed By: Clinician Lorrin Jackson, RN Debridement Type: Chemical/Enzymatic/Mechanical Agent Used: gauze and wound cleanser Level of Consciousness (Pre-procedure): Awake and Alert Pre-procedure Verification/Time Out No Taken: Bleeding: None Response to Treatment: Procedure was tolerated well Level of Consciousness (Post- Awake and Alert procedure): Post Debridement Measurements of Total Wound Length: (cm) 0.1 Width: (cm) 0.1 Depth: (cm) 0.1 Volume: (cm) 0.001 Character of Wound/Ulcer Post Debridement: Improved Post Procedure Diagnosis Same as Pre-procedure Electronic Signature(s) Signed: 04/02/2021 12:22:19 PM By: Kalman Shan DO Signed: 04/02/2021 5:27:07 PM By: Deon Pilling Entered By: Deon Pilling on 04/02/2021 11:30:47 -------------------------------------------------------------------------------- Debridement Details Patient Name: Date of Service: [name], [name] 04/02/2021 10:30 A M Medical Record Number: [id number] Patient Account Number: [id number] Date of Birth/Sex: Treating RN: August 24, 1951 (70 y.o. [name] Primary Care Provider: PA [name], NO Other Clinician: Referring Provider: Treating Provider/Extender: [name]: 1 Debridement Performed for Assessment: Wound #3 Right,Anterior Lower Leg Performed By: Clinician Lorrin Jackson, RN Debridement Type: Chemical/Enzymatic/Mechanical Agent Used: gauze and wound cleanser Level of Consciousness (Pre-procedure): Awake and Alert Pre-procedure Verification/Time Out No Taken: Bleeding: None Response to Treatment: Procedure was tolerated well Level of Consciousness (Post- Awake and Alert procedure): Post Debridement Measurements of Total Wound Length: (cm) 2.5 Width: (cm) 1 Depth: (cm) 0.1 Volume: (cm) 0.196 Character of Wound/Ulcer Post Debridement: Improved Post Procedure Diagnosis Same as Pre-procedure Electronic Signature(s) Signed: 04/02/2021 12:22:19 PM By: Kalman Shan DO Signed: 04/02/2021 5:27:07 PM By: Deon Pilling Entered By: Deon Pilling on 04/02/2021 11:31:10 -------------------------------------------------------------------------------- HPI Details Patient Name: Date of Service: [name], [name] 04/02/2021 10:30 A M Medical Record Number: [id number] Patient Account Number: [id number] Date of Birth/Sex: Treating RN: April 12, 1951 (70 y.o. [name] Primary Care Provider: PA [name], NO Other Clinician: Referring Provider: Treating Provider/Extender: [name]: 1 History of Present Illness HPI Description: [name] is a 70 year old male with a past medical history of  type 2 diabetes and essential hypertension who presents to the clinic today for bilateral lower extremity wounds. He was recently seen in the ED on 3/9 for bilateral lower extremity edema. He had a DVT study that was negative for clots. He was given triamcinolone cream and advised to elevate legs and obtain compression stockings. He has been using the triamcinolone cream however he is unable to use compression stockings due to difficulty of putting on. Patient states that for the past 2 years he has had opening and closing of weeping wounds to his legs bilaterally. They spontaneously open  and close. They are often open for several weeks before they heal spontaneously. He denies any purulent drainage, increased warmth or increased erythema to the skin. 4/21; patient presents for 1 week follow-up. The wounds on the right leg have healed however he has now 2 new wounds 1 on the anterior and posterior thigh that occurred from him scratching underneath the wrap. The left leg wound has improved and almost closed Electronic Signature(s) Signed: 04/02/2021 12:22:19 PM By: Kalman Shan DO Entered By: Kalman Shan on 04/02/2021 11:31:22 -------------------------------------------------------------------------------- Physical Exam Details Patient Name: Date of Service: [name], [name] 04/02/2021 10:30 A M Medical Record Number: [id number] Patient Account Number: [id number] Date of Birth/Sex: Treating RN: Nov 24, 1951 (70 y.o. [name] Primary Care Provider: PA [name], NO Other Clinician: Referring Provider: Treating Provider/Extender: [name]: 1 Constitutional respirations regular, non-labored and within target range for patient.. Notes Left lower extremity: Venous stasis dermatitis. Anterior and posterior wounds limited to skin breakdown. Right lower extremity: Venous stasis dermatitis. Medial open wound limited to skin breakdown. 2+ pitting  edema bilaterally to the knees Electronic Signature(s) Signed: 04/02/2021 12:22:19 PM By: Kalman Shan DO Entered By: Kalman Shan on 04/02/2021 11:32:46 -------------------------------------------------------------------------------- Physician Orders Details Patient Name: Date of Service: [name], [name] 04/02/2021 10:30 A M Medical Record Number: [id number] Patient Account Number: [id number] Date of Birth/Sex: Treating RN: 1951/05/28 (70 y.o. 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 Bathing/ Shower/ Hygiene May shower with protection but do not get wound dressing(s) wet. - You can use cast protectors...they are available at [facility], etc. Edema Control - Lymphedema / SCD / Other Elevate legs to the level of the heart or above for 30 minutes daily and/or when sitting, a frequency of: - throughout the day 3-4 times a day. Avoid standing for long periods of time. Exercise regularly Other Edema Control Orders/Instructions: - order juxtalites for pt. for BLE. Once healed, pt. will wear juxtalites on both legs applying in the morning and removing at night. Additional Orders / Instructions Other: - Patient to ensure to follow up with [facility] when their office call to schedule Arterial Studies. Wound Treatment Wound #2 - Lower Leg Wound Laterality: Left, Anterior Cleanser: Soap and Water 1 x Per Week Discharge Instructions: May  shower and wash wound with dial antibacterial soap and water prior to dressing change. Cleanser: Wound Cleanser 1 x Per Week Discharge Instructions: Cleanse the wound with wound cleanser prior to applying a clean dressing using gauze sponges, not tissue or cotton balls. Peri-Wound Care: Triamcinolone 15 (g) 1 x Per Week Discharge Instructions: Apply liberally to both legs. Use triamcinolone 15 (g) as directed Peri-Wound Care: Sween Lotion (Moisturizing lotion) 1 x Per Week Discharge Instructions: Apply moisturizing lotion as directed Prim Dressing: Promogran Prisma Matrix, 4.34 (sq in) (silver collagen) 1 x Per Week ary Discharge Instructions: Moisten collagen with saline or hydrogel Secondary Dressing: Woven Gauze Sponge, Non-Sterile 4x4 in 1 x Per Week Discharge Instructions: Apply over primary dressing as directed. Compression Wrap: Kerlix Roll 4.5x3.1 (in/yd)  1 x Per Week Discharge Instructions: ***Apply unna boot first layer to upper portion of lower leg.**Apply Kerlix and Coban compression as directed. Compression Wrap: Coban Self-Adherent Wrap 4x5 (in/yd) 1 x Per Week Discharge Instructions: Apply over Kerlix as directed. Wound #3 - Lower Leg Wound Laterality: Right, Anterior Cleanser: Soap and Water 1 x Per Week Discharge Instructions: May shower and wash wound with dial antibacterial soap and water prior to dressing change. Cleanser: Wound Cleanser 1 x Per Week Discharge Instructions: Cleanse the wound with wound cleanser prior to applying a clean dressing using gauze sponges, not tissue or cotton balls. Peri-Wound Care: Triamcinolone 15 (g) 1 x Per Week Discharge Instructions: Apply liberally to both legs. Use triamcinolone 15 (g) as directed Peri-Wound Care: Sween Lotion (Moisturizing lotion) 1 x Per Week Discharge Instructions: Apply moisturizing lotion as directed Prim Dressing: Promogran Prisma Matrix, 4.34 (sq in) (silver collagen) 1 x Per Week ary Discharge Instructions:  Moisten collagen with saline or hydrogel Secondary Dressing: Woven Gauze Sponge, Non-Sterile 4x4 in 1 x Per Week Discharge Instructions: Apply over primary dressing as directed. Compression Wrap: Kerlix Roll 4.5x3.1 (in/yd) 1 x Per Week Discharge Instructions: ***Apply unna boot first layer to upper portion of lower leg.**Apply Kerlix and Coban compression as directed. Compression Wrap: Coban Self-Adherent Wrap 4x5 (in/yd) 1 x Per Week Discharge Instructions: Apply over Kerlix as directed. Wound #4 - Lower Leg Wound Laterality: Right, Posterior Cleanser: Soap and Water 1 x Per Week Discharge Instructions: May shower and wash wound with dial antibacterial soap and water prior to dressing change. Cleanser: Wound Cleanser 1 x Per Week Discharge Instructions: Cleanse the wound with wound cleanser prior to applying a clean dressing using gauze sponges, not tissue or cotton balls. Peri-Wound Care: Triamcinolone 15 (g) 1 x Per Week Discharge Instructions: Apply liberally to both legs. Use triamcinolone 15 (g) as directed Peri-Wound Care: Sween Lotion (Moisturizing lotion) 1 x Per Week Discharge Instructions: Apply moisturizing lotion as directed Prim Dressing: Promogran Prisma Matrix, 4.34 (sq in) (silver collagen) 1 x Per Week ary Discharge Instructions: Moisten collagen with saline or hydrogel Secondary Dressing: Woven Gauze Sponge, Non-Sterile 4x4 in 1 x Per Week Discharge Instructions: Apply over primary dressing as directed. Compression Wrap: Kerlix Roll 4.5x3.1 (in/yd) 1 x Per Week Discharge Instructions: ***Apply unna boot first layer to upper portion of lower leg.**Apply Kerlix and Coban compression as directed. Compression Wrap: Coban Self-Adherent Wrap 4x5 (in/yd) 1 x Per Week Discharge Instructions: Apply over Kerlix as directed. Electronic Signature(s) Signed: 04/02/2021 12:22:19 PM By: Kalman Shan DO Entered By: Kalman Shan on 04/02/2021  12:07:33 -------------------------------------------------------------------------------- Problem List Details Patient Name: Date of Service: [name], [name] 04/02/2021 10:30 A M Medical Record Number: [id number] Patient Account Number: [id number] Date of Birth/Sex: Treating RN: 13-Jun-1951 (70 y.o. 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 04/02/2021 10:30  A M Medical Record Number: [id number] Patient Account Number: [id number] Date of Birth/Sex: Treating RN: 12/03/1951 (70 y.o. [name] Primary Care Provider: PA [name], NO Other Clinician: Referring Provider: Treating Provider/Extender: [name]: 1 Subjective Chief Complaint Information obtained from Patient bilateral lower extremity swelling with wounds History of Present Illness (HPI) [name] is a 70 year old male with a past medical history of type 2 diabetes and essential hypertension who presents to the clinic today for bilateral lower extremity wounds. He was recently seen in the ED on 3/9 for bilateral  lower extremity edema. He had a DVT study that was negative for clots. He was given triamcinolone cream and advised to elevate legs and obtain compression stockings. He has been using the triamcinolone cream however he is unable to use compression stockings due to difficulty of putting on. Patient states that for the past 2 years he has had opening and closing of weeping wounds to his legs bilaterally. They spontaneously open and close. They are often open for several weeks before they heal spontaneously. He denies any purulent drainage, increased warmth or increased erythema to the skin. 4/21; patient presents for 1 week follow-up. The wounds on the right leg have healed however he has now 2 new wounds 1 on the anterior and posterior thigh that occurred from him scratching underneath the wrap. The left leg wound has improved and almost closed Patient History Unable to Obtain Patient History due to Altered Mental Status. Information obtained from Patient. Family History Cancer - Mother,Maternal Grandparents, Heart Disease - Maternal Grandparents, Hypertension - Maternal Grandparents, No family history of Diabetes, Hereditary Spherocytosis. Social History Former smoker - ended on 09/14/1983, Alcohol Use - Rarely, Drug Use - No History, Caffeine Use - Never. Medical History Eyes Patient has history of Cataracts - 2014 Cardiovascular Patient has history of Arrhythmia - A-Fib Endocrine Patient has history of Type II Diabetes Musculoskeletal Patient has history of Osteoarthritis Medical A Surgical History Notes nd Hematologic/Lymphatic Hyperbilirubinemia Gastrointestinal Rectal Pain-Constipation Genitourinary CKD stage 3-BPH Oncologic Colon Cancer-Colon Re-section Objective Constitutional respirations regular, non-labored and within target range for patient.. Vitals Time Taken: 10:37 AM, Temperature: 98.2 F, Pulse: 53 bpm, Respiratory Rate: 18 breaths/min, Blood Pressure: 113/49  mmHg, Capillary Blood Glucose: 70 mg/dl. General Notes: Left lower extremity: Venous stasis dermatitis. Anterior and posterior wounds limited to skin breakdown. Right lower extremity: Venous stasis dermatitis. Medial open wound limited to skin breakdown. 2+ pitting edema bilaterally to the knees Integumentary (Hair, Skin) Wound #1 status is Healed - Epithelialized. Original cause of wound was Gradually Appeared. The date acquired was: 02/18/2021. The wound has been in treatment 1 weeks. The wound is located on the Right,Medial Lower Leg. The wound measures 0cm length x 0cm width x 0cm depth; 0cm^2 area and 0cm^3 volume. Wound #2 status is Open. Original cause of wound was Gradually Appeared. The date acquired was: 02/18/2021. The wound has been in treatment 1 weeks. The wound is located on the Left,Anterior Lower Leg. The wound measures 0.1cm length x 0.1cm width x 0.1cm depth; 0cm^2 area and 0cm^3 volume. There is Fat Layer (Subcutaneous Tissue) exposed. There is no tunneling or undermining noted. There is a small amount of serosanguineous drainage noted. The wound margin is distinct with the outline attached to the wound base. There is large (67-100%) pink granulation within the wound bed. There is no necrotic tissue within the wound bed. Wound #3 status is Open. Original cause of wound was Other Lesion. The date  acquired was: 04/02/2021. The wound is located on the Right,Anterior Lower Leg. The wound measures 2.5cm length x 1cm width x 0.1cm depth; 1.963cm^2 area and 0.196cm^3 volume. There is Fat Layer (Subcutaneous Tissue) exposed. There is no tunneling or undermining noted. There is a medium amount of sanguinous drainage noted. The wound margin is distinct with the outline attached to the wound base. There is large (67-100%) red granulation within the wound bed. There is no necrotic tissue within the wound bed. Wound #4 status is Open. Original cause of wound was Other Lesion. The date acquired  was: 04/02/2021. The wound is located on the Right,Posterior Lower Leg. The wound measures 0.5cm length x 0.4cm width x 0.1cm depth; 0.157cm^2 area and 0.016cm^3 volume. There is Fat Layer (Subcutaneous Tissue) exposed. There is no tunneling or undermining noted. There is a small amount of sanguinous drainage noted. The wound margin is distinct with the outline attached to the wound base. There is large (67-100%) red granulation within the wound bed. There is no necrotic tissue within the wound bed. 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 The one on the left is almost completely closed. He does however have 2 new wounds to the right lower extremity he states that these occurred due to scratching under the wrap. They are limited to skin breakdown. We will add collagen to this area and wrap with Kerlix/Coban. He resides in [place] and they have to schedule his ABIs/TBI's and these have not occurred yet. I will see him in follow-up in 1 week Procedures Wound #2 Pre-procedure diagnosis of Wound #2 is a Lymphedema located on the Left,Anterior Lower Leg . There was a Chemical/Enzymatic/Mechanical debridement performed by Lorrin Jackson, RN.. Other agent used was gauze and wound cleanser. There was no bleeding. The procedure was tolerated well. Post Debridement Measurements: 0.1cm length x 0.1cm width x 0.1cm depth; 0.001cm^3 volume. Character of Wound/Ulcer Post Debridement is improved. Post procedure Diagnosis Wound #2: Same as Pre-Procedure Wound #3 Pre-procedure diagnosis of Wound #3 is a Lymphedema located on the Right,Anterior Lower Leg . There was a  Chemical/Enzymatic/Mechanical debridement performed by Lorrin Jackson, RN.. Other agent used was gauze and wound cleanser. There was no bleeding. The procedure was tolerated well. Post Debridement Measurements: 2.5cm length x 1cm width x 0.1cm depth; 0.196cm^3 volume. Character of Wound/Ulcer Post Debridement is improved. Post procedure Diagnosis Wound #3: Same as Pre-Procedure Plan Follow-up Appointments: Return Appointment in 1 week. Bathing/ Shower/ Hygiene: May shower with protection but do not get wound dressing(s) wet. - You can use cast protectors...they are available at [facility], etc. Edema Control - Lymphedema / SCD / Other: Elevate legs to the level of the heart or above for 30 minutes daily and/or when sitting, a frequency of: - throughout the day 3-4 times a day. Avoid standing for long periods of time. Exercise regularly Other Edema Control Orders/Instructions: - order juxtalites for pt. for BLE. Once healed, pt. will wear juxtalites on both legs applying in the morning and removing at night. Additional Orders / Instructions: Other: - Patient to ensure to follow up with [facility] when their office call to schedule Arterial Studies. WOUND #2: - Lower Leg Wound Laterality: Left, Anterior Cleanser: Soap and Water 1 x  Per Week/ Discharge Instructions: May shower and wash wound with dial antibacterial soap and water prior to dressing change. Cleanser: Wound Cleanser 1 x Per Week/ Discharge Instructions: Cleanse the wound with wound cleanser prior to applying a clean dressing using gauze sponges, not tissue or cotton balls. Peri-Wound Care: Triamcinolone 15 (g) 1 x Per Week/ Discharge Instructions: Apply liberally to both legs. Use triamcinolone 15 (g) as directed Peri-Wound Care: Sween Lotion (Moisturizing lotion) 1 x Per Week/ Discharge Instructions: Apply moisturizing lotion as directed Prim Dressing: Promogran Prisma Matrix, 4.34 (sq in) (silver collagen) 1  x Per Week/ ary Discharge Instructions: Moisten collagen with saline or hydrogel Secondary Dressing: Woven Gauze Sponge, Non-Sterile 4x4 in 1 x Per Week/ Discharge Instructions: Apply over primary dressing as directed. Com pression Wrap: Kerlix Roll 4.5x3.1 (in/yd) 1 x Per Week/ Discharge Instructions: ***Apply unna boot first layer to upper portion of lower leg.**Apply Kerlix and Coban compression as directed. Com pression Wrap: Coban Self-Adherent Wrap 4x5 (in/yd) 1 x Per Week/ Discharge Instructions: Apply over Kerlix as directed. WOUND #3: - Lower Leg Wound Laterality: Right, Anterior Cleanser: Soap and Water 1 x Per Week/ Discharge Instructions: May shower and wash wound with dial antibacterial soap and water prior to dressing change. Cleanser: Wound Cleanser 1 x Per Week/ Discharge Instructions: Cleanse the wound with wound cleanser prior to applying a clean dressing using gauze sponges, not tissue or cotton balls. Peri-Wound Care: Triamcinolone 15 (g) 1 x Per Week/ Discharge Instructions: Apply liberally to both legs. Use triamcinolone 15 (g) as directed Peri-Wound Care: Sween Lotion (Moisturizing lotion) 1 x Per Week/ Discharge Instructions: Apply moisturizing lotion as directed Prim Dressing: Promogran Prisma Matrix, 4.34 (sq in) (silver collagen) 1 x Per Week/ ary Discharge Instructions: Moisten collagen with saline or hydrogel Secondary Dressing: Woven Gauze Sponge, Non-Sterile 4x4 in 1 x Per Week/ Discharge Instructions: Apply over primary dressing as directed. Com pression Wrap: Kerlix Roll 4.5x3.1 (in/yd) 1 x Per Week/ Discharge Instructions: ***Apply unna boot first layer to upper portion of lower leg.**Apply Kerlix and Coban compression as directed. Com pression Wrap: Coban Self-Adherent Wrap 4x5 (in/yd) 1 x Per Week/ Discharge Instructions: Apply over Kerlix as directed. WOUND #4: - Lower Leg Wound Laterality: Right, Posterior Cleanser: Soap and Water 1 x Per  Week/ Discharge Instructions: May shower and wash wound with dial antibacterial soap and water prior to dressing change. Cleanser: Wound Cleanser 1 x Per Week/ Discharge Instructions: Cleanse the wound with wound cleanser prior to applying a clean dressing using gauze sponges, not tissue or cotton balls. Peri-Wound Care: Triamcinolone 15 (g) 1 x Per Week/ Discharge Instructions: Apply liberally to both legs. Use triamcinolone 15 (g) as directed Peri-Wound Care: Sween Lotion (Moisturizing lotion) 1 x Per Week/ Discharge Instructions: Apply moisturizing lotion as directed Prim Dressing: Promogran Prisma Matrix, 4.34 (sq in) (silver collagen) 1 x Per Week/ ary Discharge Instructions: Moisten collagen with saline or hydrogel Secondary Dressing: Woven Gauze Sponge, Non-Sterile 4x4 in 1 x Per Week/ Discharge Instructions: Apply over primary dressing as directed. Com pression Wrap: Kerlix Roll 4.5x3.1 (in/yd) 1 x Per Week/ Discharge Instructions: ***Apply unna boot first layer to upper portion of lower leg.**Apply Kerlix and Coban compression as directed. Com pression Wrap: Coban Self-Adherent Wrap 4x5 (in/yd) 1 x Per Week/ Discharge Instructions: Apply over Kerlix as directed. 1. Kerlix and Coban bilaterally with collagen over the open wounds 2. Follow-up in 1 week Electronic Signature(s) Signed: 04/02/2021 12:22:19 PM By: Kalman Shan DO Entered By: Kalman Shan on  04/02/2021 12:18:12 -------------------------------------------------------------------------------- HxROS Details Patient Name: Date of Service: [name], [name] 04/02/2021 10:30 A M Medical Record Number: [id number] Patient Account Number: [id number] Date of Birth/Sex: Treating RN: 1951-09-16 (70 y.o. [name] Primary Care Provider: PA [name], NO Other Clinician: Referring Provider: Treating Provider/Extender: [name]: 1 Unable to Obtain Patient History due to Altered  Mental Status Information Obtained From Patient Eyes Medical History: Positive for: Cataracts - 2014 Hematologic/Lymphatic Medical History: Past Medical History Notes: Hyperbilirubinemia Cardiovascular Medical History: Positive for: Arrhythmia - A-Fib Gastrointestinal Medical History: Past Medical History Notes: Rectal Pain-Constipation Endocrine Medical History: Positive for: Type II Diabetes Time with diabetes: Since 2014 Treated with: Oral agents Blood sugar tested every day: Yes Tested : Genitourinary Medical History: Past Medical History Notes: CKD stage 3-BPH Musculoskeletal Medical History: Positive for: Osteoarthritis Oncologic Medical History: Past Medical History Notes: Colon Cancer-Colon Re-section HBO Extended History Items Eyes: Cataracts Immunizations Pneumococcal Vaccine: Received Pneumococcal Vaccination: No Implantable Devices None Family and Social History Cancer: Yes - Mother,Maternal Grandparents; Diabetes: No; Heart Disease: Yes - Maternal Grandparents; Hereditary Spherocytosis: No; Hypertension: Yes - Maternal Grandparents; Former smoker - ended on 09/14/1983; Alcohol Use: Rarely; Drug Use: No History; Caffeine Use: Never; Financial Concerns: No; Food, Clothing or Shelter Needs: No; Support System Lacking: No; Transportation Concerns: No Electronic Signature(s) Signed: 04/02/2021 12:22:19 PM By: Kalman Shan DO Signed: 04/02/2021 5:27:07 PM By: Deon Pilling Entered By: Kalman Shan on 04/02/2021 11:31:29 -------------------------------------------------------------------------------- SuperBill Details Patient Name: Date of Service: [name], [name] 04/02/2021 Medical Record Number: [id number] Patient Account Number: [id number] Date of Birth/Sex: Treating RN: 1951/06/22 (70 y.o. 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

## 2021-04-07 NOTE — Progress Notes (Signed)
[name], [name] ([id number]) Visit Report for 04/02/2021 Arrival Information Details Patient Name: Date of Service: [name] 04/02/2021 10:30 A M Medical Record Number: [id number] Patient Account Number: [id number] Date of Birth/Sex: Treating RN: May 19, 1951 (70 y.o. [name] Primary Care Arush Gatliff: PA [name], NO Other Clinician: Referring Eligh Rybacki: Treating Elmo Shumard/Extender: [name]: 1 Visit Information History Since Last Visit Added or deleted any medications: No Patient Arrived: Walker Any new allergies or adverse reactions: No Arrival Time: 10:32 Had a fall or experienced change in No Transfer Assistance: Manual activities of daily living that may affect Patient Identification Verified: Yes risk of falls: Secondary Verification Process Completed: Yes Signs or symptoms of abuse/neglect since No Patient Requires Transmission-Based Precautions: No last visito Patient Has Alerts: Yes Hospitalized since last visit: No Patient Alerts: Patient on Blood Thinner Implantable device outside of the clinic No R ABI=Non compressible excluding cellular tissue based products placed in the center since last visit: Has Dressing in Place as Prescribed: Yes Has Compression in Place as Prescribed: Yes Has Footwear/Offloading in Place as Yes Prescribed: Left: Removable Cast Walker/Walking Boot Right: Surgical Shoe with Pressure Relief Insole Pain Present Now: Yes Electronic Signature(s) Signed: 04/02/2021 5:08:10 PM By: Lorrin Jackson Entered By: Lorrin Jackson on 04/02/2021 10:37:38 -------------------------------------------------------------------------------- Encounter Discharge Information Details Patient Name: Date of Service: [name], Allen [name] 04/02/2021 10:30 A M Medical Record Number: [id number] Patient Account Number: [id number] Date of Birth/Sex: Treating RN: 05-Aug-1951 (70 y.o. [name] Primary Care Sakinah Rosamond:  PA [name], NO Other Clinician: Referring Deryk Bozman: Treating Keyonta Barradas/Extender: [name]: 1 Encounter Discharge Information Items Post Procedure Vitals Discharge Condition: Stable Temperature (F): 98.2 Ambulatory Status: Walker Pulse (bpm): 53 Discharge Destination: [place] Respiratory Rate (breaths/min): 18 Orders Sent: Yes Blood Pressure (mmHg): 113/49 Transportation: Other Schedule Follow-up Appointment: Yes Clinical Summary of Care: Provided on 04/02/2021 Form Type Recipient Paper Patient Patient/Facility Electronic Signature(s) Signed: 04/02/2021 12:04:47 PM By: Lorrin Jackson Entered By: Lorrin Jackson on 04/02/2021 12:04:47 -------------------------------------------------------------------------------- Lower Extremity Assessment Details Patient Name: Date of Service: [name], Allen [name] 04/02/2021 10:30 A M Medical Record Number: [id number] Patient Account Number: [id number] Date of Birth/Sex: Treating RN: [birth year] (70 y.o. [name] Primary Care Reatha Sur: PA TIENT, NO Other Clinician: Referring Beaulah Romanek: Treating Heath Tesler/Extender: [name]: 1 Edema Assessment Assessed: [Left: Yes] [Right: Yes] Edema: [Left: Yes] [Right: Yes] Calf Left: Right: Point of Measurement: 39 cm From Medial Instep 44 cm 47 cm Ankle Left: Right: Point of Measurement: 13 cm From Medial Instep 24 cm 27 cm Vascular Assessment Pulses: Dorsalis Pedis Palpable: [Left:Yes] [Right:Yes] Electronic Signature(s) Signed: 04/02/2021 5:08:10 PM By: Lorrin Jackson Entered By: Lorrin Jackson on 04/02/2021 10:46:23 -------------------------------------------------------------------------------- Multi Wound Chart Details Patient Name: Date of Service: [name], Allen [name] 04/02/2021 10:30 A M Medical Record Number: [id number] Patient Account Number: [id number] Date of Birth/Sex: Treating  RN: [birth year] (70 y.o. [name] Primary Care Sircharles Holzheimer: PA [name], NO Other Clinician: Referring Guida Asman: Treating Callum Wolf/Extender: [name]: 1 Vital Signs Height(in): Capillary Blood Glucose(mg/dl): 70 Weight(lbs): Pulse(bpm): 63 Body Mass Index(BMI): Blood Pressure(mmHg): 113/49 Temperature(F): 98.2 Respiratory Rate(breaths/min): 18 Photos: [1:No Photos Right, Medial Lower Leg] [2:No Photos Left, Anterior Lower Leg] [3:No Photos Right, Anterior Lower Leg] Wound Location: [1:Gradually Appeared] [2:Gradually Appeared] [3:Other Lesion] Wounding Event: [1:Lymphedema] [2:Lymphedema] [3:Lymphedema] Primary Etiology: [1:Cataracts, Arrhythmia, Type II] [2:Cataracts, Arrhythmia,  Type II] [3:Cataracts, Arrhythmia, Type II] Comorbid History: [1:Diabetes, Osteoarthritis 02/18/2021] [2:Diabetes, Osteoarthritis 02/18/2021] [3:Diabetes, Osteoarthritis 04/02/2021] Date Acquired: [1:1] [2:1] [3:0] Weeks of Treatment: [1:Healed - Epithelialized] [2:Open] [3:Open] Wound Status: [1:0x0x0] [2:0.1x0.1x0.1] [3:2.5x1x0.1] Measurements L x W x D (cm) [1:0] [2:0] [3:1.963] A (cm) : rea [1:0] [2:0] [3:0.196] Volume (cm) : [1:100.00%] [2:100.00%] [3:N/A] % Reduction in Area: [1:100.00%] [2:100.00%] [3:N/A] % Reduction in Volume: [1:Full Thickness Without Exposed] [2:Full Thickness Without Exposed] [3:Full Thickness Without Exposed] Classification: [1:Support Structures N/A] [2:Support Structures Small] [3:Support Structures Medium] Exudate A mount: [1:N/A] [2:Serosanguineous] [3:Sanguinous] Exudate Type: [1:N/A] [2:red, brown] [3:red] Exudate Color: [1:N/A] [2:Distinct, outline attached] [3:Distinct, outline attached] Wound Margin: [1:N/A] [2:Large (67-100%)] [3:Large (67-100%)] Granulation A mount: [1:N/A] [2:Pink] [3:Red] Granulation Quality: [1:N/A] [2:None Present (0%)] [3:None Present (0%)] Necrotic A mount: [1:N/A] [2:Large (67-100%)] [3:N/A] Wound  Number: 4 N/A N/A Photos: No Photos N/A N/A Right, Posterior Lower Leg N/A N/A Wound Location: Other Lesion N/A N/A Wounding Event: Lymphedema N/A N/A Primary Etiology: Cataracts, Arrhythmia, Type II N/A N/A Comorbid History: Diabetes, Osteoarthritis 04/02/2021 N/A N/A Date Acquired: 0 N/A N/A Weeks of Treatment: Open N/A N/A Wound Status: 0.5x0.4x0.1 N/A N/A Measurements L x W x D (cm) 0.157 N/A N/A A (cm) : rea 0.016 N/A N/A Volume (cm) : 0.00% N/A N/A % Reduction in Area: 0.00% N/A N/A % Reduction in Volume: Full Thickness Without Exposed N/A N/A Classification: Support Structures Small N/A N/A Exudate Amount: Sanguinous N/A N/A Exudate Type: red N/A N/A Exudate Color: Distinct, outline attached N/A N/A Wound Margin: Large (67-100%) N/A N/A Granulation Amount: Red N/A N/A Granulation Quality: None Present (0%) N/A N/A Necrotic Amount: Fat Layer (Subcutaneous Tissue): Yes N/A N/A Exposed Structures: Fascia: No Tendon: No Muscle: No Joint: No Bone: No N/A N/A N/A Epithelialization: Treatment Notes Electronic Signature(s) Signed: 04/02/2021 12:22:19 PM By: Kalman Shan DO Signed: 04/02/2021 5:27:07 PM By: Deon Pilling Entered By: Kalman Shan on 04/02/2021 11:30:06 -------------------------------------------------------------------------------- Multi-Disciplinary Care Plan Details Patient Name: Date of Service: [name], Allen [name] 04/02/2021 10:30 A M Medical Record Number: [id number] Patient Account Number: [id number] Date of Birth/Sex: Treating RN: [birth year] (70 y.o. [name] Primary Care Celestine Bougie: PA [name], NO Other Clinician: Referring Demira Gwynne: Treating Adrienne Trombetta/Extender: [name]: 1 Active Inactive Wound/Skin Impairment Nursing Diagnoses: Impaired tissue integrity Knowledge deficit related to ulceration/compromised skin integrity Goals: Patient will have a decrease in wound  volume by X% from date: (specify in notes) Date Initiated: 03/26/2021 Target Resolution Date: 04/15/2021 Goal Status: Active Patient/caregiver will verbalize understanding of skin care regimen Date Initiated: 03/26/2021 Target Resolution Date: 04/18/2021 Goal Status: Active Ulcer/skin breakdown will have a volume reduction of 30% by week 4 Date Initiated: 03/26/2021 Target Resolution Date: 04/18/2021 Goal Status: Active Interventions: Assess patient/caregiver ability to obtain necessary supplies Assess patient/caregiver ability to perform ulcer/skin care regimen upon admission and as needed Assess ulceration(s) every visit Notes: Electronic Signature(s) Signed: 04/02/2021 5:27:07 PM By: Deon Pilling Entered By: Deon Pilling on 04/02/2021 11:07:11 -------------------------------------------------------------------------------- Pain Assessment Details Patient Name: Date of Service: [name], Allen [name] 04/02/2021 10:30 A M Medical Record Number: [id number] Patient Account Number: [id number] Date of Birth/Sex: Treating RN: 1951/06/17 (70 y.o. [name] Primary Care Miaisabella Bacorn: PA [name], NO Other Clinician: Referring Carold Eisner: Treating Jayveon Convey/Extender: [name]: 1 Active Problems Location of Pain Severity and Description of Pain Patient Has Paino Yes Site Locations Pain Location: Generalized Pain, Pain in Ulcers With Dressing Change:  Yes Rate the pain. Current Pain Level: 5 Character of Pain Describe the Pain: Aching, Tender Pain Management and Medication Current Pain Management: Medication: No Cold Application: No Rest: Yes Massage: No Activity: No T.E.N.S.: No Heat Application: No Leg drop or elevation: No Is the Current Pain Management Adequate: Inadequate How does your wound impact your activities of daily livingo Sleep: No Bathing: No Appetite: No Relationship With Others: No Bladder Continence: No Emotions: No Bowel  Continence: No Work: No Toileting: No Drive: No Dressing: No Hobbies: No Electronic Signature(s) Signed: 04/02/2021 5:08:10 PM By: Lorrin Jackson Entered By: Lorrin Jackson on 04/02/2021 10:40:08 -------------------------------------------------------------------------------- Patient/Caregiver Education Details Patient Name: Date of Service: [name], Allen [name] 4/21/2022andnbsp10:30 A M Medical Record Number: [id number] Patient Account Number: [id number] Date of Birth/Gender: Treating RN: [birth year] (70 y.o. [name] Primary Care Physician: PA [name], NO Other Clinician: Referring Physician: Treating Physician/Extender: [name] in Treatment: 1 Education Assessment Education Provided To: Patient Education Topics Provided Wound/Skin Impairment: Handouts: Skin Care Do's and Dont's Methods: Explain/Verbal Responses: Reinforcements needed Electronic Signature(s) Signed: 04/02/2021 5:27:07 PM By: Deon Pilling Entered By: Deon Pilling on 04/02/2021 11:07:21 -------------------------------------------------------------------------------- Wound Assessment Details Patient Name: Date of Service: [name], Allen [name] 04/02/2021 10:30 A M Medical Record Number: [id number] Patient Account Number: [id number] Date of Birth/Sex: Treating RN: 12/01/1951 (70 y.o. [name] Primary Care Princes Finger: PA [name], NO Other Clinician: Referring Lothar Prehn: Treating Lundon Rosier/Extender: [name]: 1 Wound Status Wound Number: 1 Primary Etiology: Lymphedema Wound Location: Right, Medial Lower Leg Wound Status: Healed - Epithelialized Wounding Event: Gradually Appeared Comorbid History: Cataracts, Arrhythmia, Type II Diabetes, Osteoarthritis Date Acquired: 02/18/2021 Weeks Of Treatment: 1 Clustered Wound: No Wound Measurements Length: (cm) Width: (cm) Depth: (cm) Area: (cm) Volume: (cm) 0 % Reduction in Area:  100% 0 % Reduction in Volume: 100% 0 0 0 Wound Description Classification: Full Thickness Without Exposed Support Structur es Electronic Signature(s) Signed: 04/02/2021 5:08:10 PM By: Lorrin Jackson Entered By: Lorrin Jackson on 04/02/2021 10:46:43 -------------------------------------------------------------------------------- Wound Assessment Details Patient Name: Date of Service: [name], Allen [name] 04/02/2021 10:30 A M Medical Record Number: [id number] Patient Account Number: [id number] Date of Birth/Sex: Treating RN: [birth year] (70 y.o. [name] Primary Care Brynnly Bonet: PA TIENT, NO Other Clinician: Referring Branae Crail: Treating Jalyssa Fleisher/Extender: [name]: 1 Wound Status Wound Number: 2 Primary Etiology: Lymphedema Wound Location: Left, Anterior Lower Leg Wound Status: Open Wounding Event: Gradually Appeared Comorbid History: Cataracts, Arrhythmia, Type II Diabetes, Osteoarthritis Date Acquired: 02/18/2021 Weeks Of Treatment: 1 Clustered Wound: No Wound Measurements Length: (cm) 0.1 Width: (cm) 0.1 Depth: (cm) 0.1 Area: (cm) 0 Volume: (cm) 0 % Reduction in Area: 100% % Reduction in Volume: 100% Epithelialization: Large (67-100%) Tunneling: No Undermining: No Wound Description Classification: Full Thickness Without Exposed Support Structures Wound Margin: Distinct, outline attached Exudate Amount: Small Exudate Type: Serosanguineous Exudate Color: red, brown Foul Odor After Cleansing: No Slough/Fibrino No Wound Bed Granulation Amount: Large (67-100%) Exposed Structure Granulation Quality: Pink Fascia Exposed: No Necrotic Amount: None Present (0%) Fat Layer (Subcutaneous Tissue) Exposed: Yes Tendon Exposed: No Muscle Exposed: No Joint Exposed: No Bone Exposed: No Treatment Notes Wound #2 (Lower Leg) Wound Laterality: Left, Anterior Cleanser Soap and Water Discharge Instruction: May shower and wash wound  with dial antibacterial soap and water prior to dressing change. Wound Cleanser Discharge Instruction: Cleanse the wound with wound cleanser prior to applying a clean  dressing using gauze sponges, not tissue or cotton balls. Peri-Wound Care Triamcinolone 15 (g) Discharge Instruction: Apply liberally to both legs. Use triamcinolone 15 (g) as directed Sween Lotion (Moisturizing lotion) Discharge Instruction: Apply moisturizing lotion as directed Topical Primary Dressing Promogran Prisma Matrix, 4.34 (sq in) (silver collagen) Discharge Instruction: Moisten collagen with saline or hydrogel Secondary Dressing Woven Gauze Sponge, Non-Sterile 4x4 in Discharge Instruction: Apply over primary dressing as directed. Secured With Compression Wrap Kerlix Roll 4.5x3.1 (in/yd) Discharge Instruction: ***Apply unna boot first layer to upper portion of lower leg.**Apply Kerlix and Coban compression as directed. Coban Self-Adherent Wrap 4x5 (in/yd) Discharge Instruction: Apply over Kerlix as directed. Compression Stockings Add-Ons Electronic Signature(s) Signed: 04/02/2021 5:27:07 PM By: Deon Pilling Entered By: Deon Pilling on 04/02/2021 11:19:22 -------------------------------------------------------------------------------- Wound Assessment Details Patient Name: Date of Service: [name], Allen [name] 04/02/2021 10:30 A M Medical Record Number: [id number] Patient Account Number: [id number] Date of Birth/Sex: Treating RN: 1950-12-23 (70 y.o. [name] Primary Care Damean Poffenberger: PA [name], NO Other Clinician: Referring Yoshito Gaza: Treating Miquel Lamson/Extender: [name]: 1 Wound Status Wound Number: 3 Primary Etiology: Lymphedema Wound Location: Right, Anterior Lower Leg Wound Status: Open Wounding Event: Other Lesion Comorbid History: Cataracts, Arrhythmia, Type II Diabetes, Osteoarthritis Date Acquired: 04/02/2021 Weeks Of Treatment: 0 Clustered Wound:  No Photos Wound Measurements Length: (cm) 2.5 Width: (cm) 1 Depth: (cm) 0.1 Area: (cm) 1.963 Volume: (cm) 0.196 % Reduction in Area: 0% % Reduction in Volume: 0% Tunneling: No Undermining: No Wound Description Classification: Full Thickness Without Exposed Support Structures Wound Margin: Distinct, outline attached Exudate Amount: Medium Exudate Type: Sanguinous Exudate Color: red Foul Odor After Cleansing: No Slough/Fibrino No Wound Bed Granulation Amount: Large (67-100%) Exposed Structure Granulation Quality: Red Fascia Exposed: No Necrotic Amount: None Present (0%) Fat Layer (Subcutaneous Tissue) Exposed: Yes Tendon Exposed: No Muscle Exposed: No Joint Exposed: No Bone Exposed: No Treatment Notes Wound #3 (Lower Leg) Wound Laterality: Right, Anterior Cleanser Soap and Water Discharge Instruction: May shower and wash wound with dial antibacterial soap and water prior to dressing change. Wound Cleanser Discharge Instruction: Cleanse the wound with wound cleanser prior to applying a clean dressing using gauze sponges, not tissue or cotton balls. Peri-Wound Care Triamcinolone 15 (g) Discharge Instruction: Apply liberally to both legs. Use triamcinolone 15 (g) as directed Sween Lotion (Moisturizing lotion) Discharge Instruction: Apply moisturizing lotion as directed Topical Primary Dressing Promogran Prisma Matrix, 4.34 (sq in) (silver collagen) Discharge Instruction: Moisten collagen with saline or hydrogel Secondary Dressing Woven Gauze Sponge, Non-Sterile 4x4 in Discharge Instruction: Apply over primary dressing as directed. Secured With Compression Wrap Kerlix Roll 4.5x3.1 (in/yd) Discharge Instruction: ***Apply unna boot first layer to upper portion of lower leg.**Apply Kerlix and Coban compression as directed. Coban Self-Adherent Wrap 4x5 (in/yd) Discharge Instruction: Apply over Kerlix as directed. Compression Stockings Add-Ons Electronic  Signature(s) Signed: 04/02/2021 5:08:10 PM By: Lorrin Jackson Signed: 04/07/2021 2:09:33 PM By: [name] Entered By: [name] on 04/02/2021 16:54:44 -------------------------------------------------------------------------------- Wound Assessment Details Patient Name: Date of Service: [name], Allen [name] 04/02/2021 10:30 A M Medical Record Number: [id number] Patient Account Number: [id number] Date of Birth/Sex: Treating RN: [birth year] (70 y.o. [name] Primary Care Ivionna Verley: PA [name], NO Other Clinician: Referring Kentley Cedillo: Treating Heiress Williamson/Extender: [name]: 1 Wound Status Wound Number: 4 Primary Etiology: Lymphedema Wound Location: Right, Posterior Lower Leg Wound Status: Open Wounding Event: Other Lesion Comorbid History: Cataracts, Arrhythmia, Type II Diabetes, Osteoarthritis  Date Acquired: 04/02/2021 Weeks Of Treatment: 0 Clustered Wound: No Photos Wound Measurements Length: (cm) 0.5 Width: (cm) 0.4 Depth: (cm) 0.1 Area: (cm) 0.157 Volume: (cm) 0.016 % Reduction in Area: 0% % Reduction in Volume: 0% Tunneling: No Undermining: No Wound Description Classification: Full Thickness Without Exposed Support Structures Wound Margin: Distinct, outline attached Exudate Amount: Small Exudate Type: Sanguinous Exudate Color: red Foul Odor After Cleansing: No Slough/Fibrino No Wound Bed Granulation Amount: Large (67-100%) Exposed Structure Granulation Quality: Red Fascia Exposed: No Necrotic Amount: None Present (0%) Fat Layer (Subcutaneous Tissue) Exposed: Yes Tendon Exposed: No Muscle Exposed: No Joint Exposed: No Bone Exposed: No Treatment Notes Wound #4 (Lower Leg) Wound Laterality: Right, Posterior Cleanser Soap and Water Discharge Instruction: May shower and wash wound with dial antibacterial soap and water prior to dressing change. Wound Cleanser Discharge Instruction: Cleanse the wound with  wound cleanser prior to applying a clean dressing using gauze sponges, not tissue or cotton balls. Peri-Wound Care Triamcinolone 15 (g) Discharge Instruction: Apply liberally to both legs. Use triamcinolone 15 (g) as directed Sween Lotion (Moisturizing lotion) Discharge Instruction: Apply moisturizing lotion as directed Topical Primary Dressing Promogran Prisma Matrix, 4.34 (sq in) (silver collagen) Discharge Instruction: Moisten collagen with saline or hydrogel Secondary Dressing Woven Gauze Sponge, Non-Sterile 4x4 in Discharge Instruction: Apply over primary dressing as directed. Secured With Compression Wrap Kerlix Roll 4.5x3.1 (in/yd) Discharge Instruction: ***Apply unna boot first layer to upper portion of lower leg.**Apply Kerlix and Coban compression as directed. Coban Self-Adherent Wrap 4x5 (in/yd) Discharge Instruction: Apply over Kerlix as directed. Compression Stockings Add-Ons Electronic Signature(s) Signed: 04/02/2021 5:08:10 PM By: Lorrin Jackson Signed: 04/07/2021 2:09:33 PM By: [name] Entered By: [name] on 04/02/2021 16:55:15 -------------------------------------------------------------------------------- Vitals Details Patient Name: Date of Service: [name], Allen [name] 04/02/2021 10:30 A M Medical Record Number: [id number] Patient Account Number: [id number] Date of Birth/Sex: Treating RN: 10-17-1951 (70 y.o. [name] Primary Care Chaitra Mast: PA [name], NO Other Clinician: Referring Rivers Gassmann: Treating Ardenia Stiner/Extender: [name]: 1 Vital Signs Time Taken: 10:37 Temperature (F): 98.2 Pulse (bpm): 53 Respiratory Rate (breaths/min): 18 Blood Pressure (mmHg): 113/49 Capillary Blood Glucose (mg/dl): 70 Reference Range: 80 - 120 mg / dl Electronic Signature(s) Signed: 04/02/2021 5:08:10 PM By: Lorrin Jackson Entered By: Lorrin Jackson on 04/02/2021 10:39:22

## 2021-04-09 ENCOUNTER — Encounter (HOSPITAL_BASED_OUTPATIENT_CLINIC_OR_DEPARTMENT_OTHER): Payer: Medicare Other | Admitting: Internal Medicine

## 2021-04-09 ENCOUNTER — Other Ambulatory Visit: Payer: Self-pay

## 2021-04-09 DIAGNOSIS — E11622 Type 2 diabetes mellitus with other skin ulcer: Secondary | ICD-10-CM | POA: Diagnosis not present

## 2021-04-09 DIAGNOSIS — L97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 with unspecified severity: Secondary | ICD-10-CM | POA: Diagnosis not present

## 2021-04-09 DIAGNOSIS — L97829 Non-pressure chronic ulcer of other part of left lower leg with unspecified severity: Secondary | ICD-10-CM | POA: Diagnosis not present

## 2021-04-09 NOTE — Progress Notes (Signed)
[name] ([id number]) Visit Report for 04/09/2021 Chief Complaint Document Details Patient Name: Date of Service: [name], [place] Allen L. 04/09/2021 10:30 A M Medical Record Number: [id number] Patient Account Number: [id number] Date of Birth/Sex: Treating RN: [birth year] (70 y.o. [name] Primary Care Provider: PA [name], NO Other Clinician: Referring Provider: Treating Provider/Extender: [name]: 2 Information Obtained from: Patient Chief Complaint bilateral lower extremity swelling with wounds Electronic Signature(s) Signed: 04/09/2021 12:35:40 PM By: Kalman Shan DO Entered By: Kalman Shan on 04/09/2021 12:27:09 -------------------------------------------------------------------------------- HPI Details Patient Name: Date of Service: [name], [name] 04/09/2021 10:30 A M Medical Record Number: [id number] Patient Account Number: [id number] Date of Birth/Sex: Treating RN: 23-Sep-1951 (70 y.o. [name] Primary Care Provider: PA [name], NO Other Clinician: Referring Provider: Treating Provider/Extender: [name]: 2 History of Present Illness HPI Description: [name] is a 70 year old male with a past medical history of type 2 diabetes and essential hypertension who presents to the clinic today for bilateral lower extremity wounds. He was recently seen in the ED on 3/9 for bilateral lower extremity edema. He had a DVT study that was negative for clots. He was given triamcinolone cream and advised to elevate legs and obtain compression stockings. He has been using the triamcinolone cream however he is unable to use compression stockings due to difficulty of putting on. Patient states that for the past 2 years he has had opening and closing of weeping wounds to his legs bilaterally. They spontaneously open and close. They are often open for several weeks before they heal  spontaneously. He denies any purulent drainage, increased warmth or increased erythema to the skin. 4/21; patient presents for 1 week follow-up. The wounds on the right leg have healed however he has now 2 new wounds 1 on the anterior and posterior thigh that occurred from him scratching underneath the wrap. The left leg wound has improved and almost closed 4/28; patient presents for 1 week follow-up. He continues to have a wound on the right anterior leg that is showing signs of healing. The posterior right thigh has closed. He now has a new wound to the left leg that is limited to skin breakdown. 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 The wounds are occurring because he goes underneath the leg wraps and scratches creating new wounds. He has no complaints today. Electronic Signature(s) Signed: 04/09/2021 12:35:40 PM By: Kalman Shan DO Entered By: Kalman Shan on 04/09/2021 12:30:19 -------------------------------------------------------------------------------- Physical Exam Details Patient Name: Date of Service: [name], [name] 04/09/2021 10:30 A M Medical Record Number: [id number] Patient Account Number: [id number] Date of Birth/Sex: Treating RN: Mar 26, 1951 (70 y.o. [name] Primary Care Provider: PA [name], NO Other Clinician: Referring Provider: Treating Provider/Extender: [name]: 2 Constitutional respirations regular, non-labored and within target range for patient.. Cardiovascular 2+ dorsalis pedis/posterior tibialis pulses. Psychiatric pleasant and cooperative. Notes Right anterior lower leg with wound limited to skin breakdown. Left posterior lower leg: Wound limited to skin breakdown No signs of infection. Scratch marks throughout his entire lower extremities that have scabbed over. Electronic Signature(s) Signed: 04/09/2021 12:35:40 PM By:  Kalman Shan DO Entered By: Kalman Shan on 04/09/2021 12:30:32 -------------------------------------------------------------------------------- Physician Orders Details Patient Name: Date of Service: [name], [name] 04/09/2021 10:30 A M Medical Record Number: [id number]  Patient Account Number: [id number] Date of Birth/Sex: Treating RN: February 16, 1951 (70 y.o. 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 - Dr. Heber [place] Return A Bathing/ Shower/ Hygiene May shower with protection but do not get wound dressing(s) wet. - You can use cast protectors...they are available at [facility], etc. Edema Control - Lymphedema / SCD / Other Elevate legs to the level of the heart or above for 30 minutes daily and/or when sitting, a frequency of: - throughout the day 3-4 times a day. Avoid standing for long periods of time. Exercise regularly Other Edema Control Orders/Instructions: - patient to bring in juxtalites HD weekly to wound center. Additional Orders / Instructions Other: - ****Patient to ensure to follow up with [facility] when their office call to schedule Arterial Studies.**** Wound Treatment Wound #3 - Lower Leg Wound Laterality: Right, Anterior Cleanser: Soap and Water 1 x Per Week Discharge Instructions: May shower and wash wound with dial antibacterial soap and water prior to dressing change. Cleanser: Wound Cleanser 1 x Per Week Discharge  Instructions: Cleanse the wound with wound cleanser prior to applying a clean dressing using gauze sponges, not tissue or cotton balls. Peri-Wound Care: Triamcinolone 15 (g) 1 x Per Week Discharge Instructions: Apply liberally to both legs. Use triamcinolone 15 (g) as directed Peri-Wound Care: Sween Lotion (Moisturizing lotion) 1 x Per Week Discharge Instructions: Apply moisturizing lotion as directed Prim Dressing: Promogran Prisma Matrix, 4.34 (sq in) (silver collagen) 1 x Per Week ary Discharge Instructions: Moisten collagen with saline or hydrogel Secondary Dressing: Woven Gauze Sponge, Non-Sterile 4x4 in 1 x Per Week Discharge Instructions: Apply over primary dressing as directed. Compression Wrap: Kerlix Roll 4.5x3.1 (in/yd) 1 x Per Week Discharge Instructions: ***Apply unna boot first layer to upper portion of lower leg.**Apply Kerlix and Coban compression as directed. Compression Wrap: Coban Self-Adherent Wrap 4x5 (in/yd) 1 x Per Week Discharge Instructions: Apply over Kerlix as directed. Wound #5 - Lower Leg Wound Laterality: Left, Posterior Cleanser: Soap and Water 1 x Per Week Discharge Instructions: May shower and wash wound with dial antibacterial soap and water prior to dressing change. Cleanser: Wound Cleanser 1 x Per Week Discharge Instructions: Cleanse the wound with wound cleanser prior to applying a clean dressing using gauze sponges, not tissue or cotton balls. Peri-Wound Care: Triamcinolone 15 (g) 1 x Per Week Discharge Instructions: Apply liberally to both legs. Use triamcinolone 15 (g) as directed Peri-Wound Care: Sween Lotion (Moisturizing lotion) 1 x Per Week Discharge Instructions: Apply moisturizing lotion as directed Prim Dressing: Promogran Prisma Matrix, 4.34 (sq in) (silver collagen) 1 x Per Week ary Discharge Instructions: Moisten collagen with saline or hydrogel Secondary Dressing: Woven Gauze Sponge, Non-Sterile 4x4 in 1 x Per Week Discharge Instructions:  Apply over primary dressing as directed. Compression Wrap: Kerlix Roll 4.5x3.1 (in/yd) 1 x Per Week Discharge Instructions: ***Apply unna boot first layer to upper portion of lower leg.**Apply Kerlix and Coban compression as directed. Compression Wrap: Coban Self-Adherent Wrap 4x5 (in/yd) 1 x Per Week Discharge Instructions: Apply over Kerlix as directed. Electronic Signature(s) Signed: 04/09/2021 12:35:40 PM By: Heber [place],  Janett Billow DO Entered By: Kalman Shan on 04/09/2021 12:30:45 -------------------------------------------------------------------------------- Problem List Details Patient Name: Date of Service: [name] Allen L. 04/09/2021 10:30 A M Medical Record Number: [id number] Patient Account Number: [id number] Date of Birth/Sex: Treating RN: 12/01/1951 (70 y.o. 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 04/09/2021 10:30 A M Medical Record Number: [id number] Patient Account Number: [id number] Date of Birth/Sex: Treating RN: 1951/01/25 (70 y.o. [name] Primary Care Provider: PA [name], NO Other  Clinician: Referring Provider: Treating Provider/Extender: [name]: 2 Subjective Chief Complaint Information obtained from Patient bilateral lower extremity swelling with wounds History of Present Illness (HPI) Kassian Burnet is a 70 year old male with a past medical history of type 2 diabetes and essential hypertension who presents to the clinic today for bilateral lower extremity wounds. He was recently seen in the ED on 3/9 for bilateral lower extremity edema. He had a DVT study that was negative for clots. He was given triamcinolone cream and advised to elevate legs and obtain compression stockings. He has been using the triamcinolone cream however he is unable to use compression stockings due to difficulty of putting on. Patient states that for the past 2 years he has had opening and closing of weeping wounds to his legs bilaterally. They spontaneously open and close. They are often open for several weeks before they heal spontaneously. He denies any purulent drainage, increased warmth or increased erythema to the skin. 4/21; patient presents for 1 week follow-up. The wounds on the right leg have healed however he has now 2 new wounds 1 on the anterior and posterior thigh that occurred from him scratching underneath the wrap. The left leg wound has improved and almost closed 4/28; patient presents for 1 week follow-up. He continues to have a wound on the right anterior leg that is showing signs of healing. The posterior right thigh has closed. He now has a new wound to the left leg that is limited to skin breakdown. 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 The wounds are occurring because he goes underneath the leg wraps and scratches creating new wounds. He has no complaints today. Patient History Unable to Obtain Patient History due to Altered Mental Status. Information  obtained from Patient. Family History Cancer - Mother,Maternal Grandparents, Heart Disease - Maternal Grandparents, Hypertension - Maternal Grandparents, No family history of Diabetes, Hereditary Spherocytosis. Social History Former smoker - ended on 09/14/1983, Alcohol Use - Rarely, Drug Use - No History, Caffeine Use - Never. Medical History Eyes Patient has history of Cataracts - 2014 Cardiovascular Patient has history of Arrhythmia - A-Fib Endocrine Patient has history of Type II Diabetes Musculoskeletal Patient has history of Osteoarthritis Medical A Surgical History Notes nd Hematologic/Lymphatic Hyperbilirubinemia Gastrointestinal Rectal Pain-Constipation Genitourinary CKD  stage 3-BPH Oncologic Colon Cancer-Colon Re-section Objective Constitutional respirations regular, non-labored and within target range for patient.. Vitals Time Taken: 10:48 AM, Temperature: 98.4 F, Pulse: 56 bpm, Respiratory Rate: 18 breaths/min, Blood Pressure: 112/65 mmHg. Cardiovascular 2+ dorsalis pedis/posterior tibialis pulses. Psychiatric pleasant and cooperative. General Notes: Right anterior lower leg with wound limited to skin breakdown. Left posterior lower leg: Wound limited to skin breakdown No signs of infection. Scratch marks throughout his entire lower extremities that have scabbed over. Integumentary (Hair, Skin) Wound #2 status is Healed - Epithelialized. Original cause of wound was Gradually Appeared. The date acquired was: 02/18/2021. The wound has been in treatment 2 weeks. The wound is located on the Left,Anterior Lower Leg. The wound measures 0cm length x 0cm width x 0cm depth; 0cm^2 area and 0cm^3 volume. Wound #3 status is Open. Original cause of wound was Other Lesion. The date acquired was: 04/02/2021. The wound has been in treatment 1 weeks. The wound is located on the Right,Anterior Lower Leg. The wound measures 1.7cm length x 0.7cm width x 0.1cm depth; 0.935cm^2 area and  0.093cm^3 volume. There is Fat Layer (Subcutaneous Tissue) exposed. There is no tunneling or undermining noted. There is a medium amount of sanguinous drainage noted. The wound margin is distinct with the outline attached to the wound base. There is large (67-100%) red granulation within the wound bed. There is no necrotic tissue within the wound bed. Wound #4 status is Healed - Epithelialized. Original cause of wound was Other Lesion. The date acquired was: 04/02/2021. The wound has been in treatment 1 weeks. The wound is located on the Right,Posterior Lower Leg. The wound measures 0cm length x 0cm width x 0cm depth; 0cm^2 area and 0cm^3 volume. Wound #5 status is Open. Original cause of wound was Gradually Appeared. The date acquired was: 04/09/2021. The wound is located on the Left,Posterior Lower Leg. The wound measures 0.2cm length x 0.2cm width x 0.1cm depth; 0.031cm^2 area and 0.003cm^3 volume. There is Fat Layer (Subcutaneous Tissue) exposed. There is no tunneling or undermining noted. There is a medium amount of serosanguineous drainage noted. The wound margin is distinct with the outline attached to the wound base. There is large (67-100%) red granulation within the wound bed. There is no necrotic tissue within the wound bed. 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 He has 2 wounds that are limited to skin breakdown 1 on each lower extremity. No signs of infection. We will continue collagen to these areas under Kerlix/Coban wraps. Patient has swelling to his lower extremities and would benefit greatly from compression wraps. I think the main issue here is his scratching. We may not be able to do the wraps next  time if he continues to scratch underneath creating more wounds. I think he could heal without compression if we needed to. He does have juxta lite compressions. We can put these on next time if we do not do leg wraps. He can follow-up in 1 week Plan Follow-up Appointments: Return Appointment in 1 week. - Dr. Heber [place] Bathing/ Shower/ Hygiene: May shower with protection but do not get wound dressing(s) wet. - You can use cast protectors...they are available at [facility], etc. Edema Control - Lymphedema / SCD / Other: Elevate legs to the level of the heart or above  for 30 minutes daily and/or when sitting, a frequency of: - throughout the day 3-4 times a day. Avoid standing for long periods of time. Exercise regularly Other Edema Control Orders/Instructions: - patient to bring in juxtalites HD weekly to wound center. Additional Orders / Instructions: Other: - ****Patient to ensure to follow up with [facility] when their office call to schedule Arterial Studies.**** WOUND #3: - Lower Leg Wound Laterality: Right, Anterior Cleanser: Soap and Water 1 x Per Week/ Discharge Instructions: May shower and wash wound with dial antibacterial soap and water prior to dressing change. Cleanser: Wound Cleanser 1 x Per Week/ Discharge Instructions: Cleanse the wound with wound cleanser prior to applying a clean dressing using gauze sponges, not tissue or cotton balls. Peri-Wound Care: Triamcinolone 15 (g) 1 x Per Week/ Discharge Instructions: Apply liberally to both legs. Use triamcinolone 15 (g) as directed Peri-Wound Care: Sween Lotion (Moisturizing lotion) 1 x Per Week/ Discharge Instructions: Apply moisturizing lotion as directed Prim Dressing: Promogran Prisma Matrix, 4.34 (sq in) (silver collagen) 1 x Per Week/ ary Discharge Instructions: Moisten collagen with saline or hydrogel Secondary Dressing: Woven Gauze Sponge, Non-Sterile 4x4 in 1 x Per Week/ Discharge Instructions: Apply  over primary dressing as directed. Com pression Wrap: Kerlix Roll 4.5x3.1 (in/yd) 1 x Per Week/ Discharge Instructions: ***Apply unna boot first layer to upper portion of lower leg.**Apply Kerlix and Coban compression as directed. Com pression Wrap: Coban Self-Adherent Wrap 4x5 (in/yd) 1 x Per Week/ Discharge Instructions: Apply over Kerlix as directed. WOUND #5: - Lower Leg Wound Laterality: Left, Posterior Cleanser: Soap and Water 1 x Per Week/ Discharge Instructions: May shower and wash wound with dial antibacterial soap and water prior to dressing change. Cleanser: Wound Cleanser 1 x Per Week/ Discharge Instructions: Cleanse the wound with wound cleanser prior to applying a clean dressing using gauze sponges, not tissue or cotton balls. Peri-Wound Care: Triamcinolone 15 (g) 1 x Per Week/ Discharge Instructions: Apply liberally to both legs. Use triamcinolone 15 (g) as directed Peri-Wound Care: Sween Lotion (Moisturizing lotion) 1 x Per Week/ Discharge Instructions: Apply moisturizing lotion as directed Prim Dressing: Promogran Prisma Matrix, 4.34 (sq in) (silver collagen) 1 x Per Week/ ary Discharge Instructions: Moisten collagen with saline or hydrogel Secondary Dressing: Woven Gauze Sponge, Non-Sterile 4x4 in 1 x Per Week/ Discharge Instructions: Apply over primary dressing as directed. Com pression Wrap: Kerlix Roll 4.5x3.1 (in/yd) 1 x Per Week/ Discharge Instructions: ***Apply unna boot first layer to upper portion of lower leg.**Apply Kerlix and Coban compression as directed. Com pression Wrap: Coban Self-Adherent Wrap 4x5 (in/yd) 1 x Per Week/ Discharge Instructions: Apply over Kerlix as directed. 1. Collagen under Kerlix Coban 2. Follow-up in 1 week Electronic Signature(s) Signed: 04/09/2021 12:35:40 PM By: Kalman Shan DO Entered By: Kalman Shan on 04/09/2021 12:34:24 -------------------------------------------------------------------------------- HxROS  Details Patient Name: Date of Service: [name], [name] 04/09/2021 10:30 A M Medical Record Number: [id number] Patient Account Number: [id number] Date of Birth/Sex: Treating RN: [birth year] (70 y.o. [name] Primary Care Provider: PA [name], NO Other Clinician: Referring Provider: Treating Provider/Extender: [name]: 2 Unable to Obtain Patient History due to Altered Mental Status Information Obtained From Patient Eyes Medical History: Positive for: Cataracts - 2014 Hematologic/Lymphatic Medical History: Past Medical History Notes: Hyperbilirubinemia Cardiovascular Medical History: Positive for: Arrhythmia - A-Fib Gastrointestinal Medical History: Past Medical History Notes: Rectal Pain-Constipation Endocrine Medical History: Positive for: Type II Diabetes Time with diabetes: Since 2014  Treated with: Oral agents Blood sugar tested every day: Yes Tested : Genitourinary Medical History: Past Medical History Notes: CKD stage 3-BPH Musculoskeletal Medical History: Positive for: Osteoarthritis Oncologic Medical History: Past Medical History Notes: Colon Cancer-Colon Re-section HBO Extended History Items Eyes: Cataracts Immunizations Pneumococcal Vaccine: Received Pneumococcal Vaccination: No Implantable Devices None Family and Social History Cancer: Yes - Mother,Maternal Grandparents; Diabetes: No; Heart Disease: Yes - Maternal Grandparents; Hereditary Spherocytosis: No; Hypertension: Yes - Maternal Grandparents; Former smoker - ended on 09/14/1983; Alcohol Use: Rarely; Drug Use: No History; Caffeine Use: Never; Financial Concerns: No; Food, Clothing or Shelter Needs: No; Support System Lacking: No; Transportation Concerns: No Electronic Signature(s) Signed: 04/09/2021 12:35:40 PM By: Kalman Shan DO Signed: 04/09/2021 6:31:57 PM By: Deon Pilling Signed: 04/09/2021 6:31:57 PM By: Deon Pilling Entered By:  Kalman Shan on 04/09/2021 12:30:26 -------------------------------------------------------------------------------- SuperBill Details Patient Name: Date of Service: [name], [name] 04/09/2021 Medical Record Number: [id number] Patient Account Number: [id number] Date of Birth/Sex: Treating RN: September 11, 1951 (70 y.o. 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

## 2021-04-10 NOTE — Progress Notes (Signed)
[name], [name] ([id number]) Visit Report for 04/09/2021 Arrival Information Details Patient Name: Date of Service: [name] [name] 04/09/2021 10:30 A M Medical Record Number: [id number] Patient Account Number: [id number] Date of Birth/Sex: Treating RN: 03/16/1951 (70 y.o. [name] Primary Care Enoc Getter: PA [name], NO Other Clinician: Referring Anhelica Fowers: Treating Allante Whitmire/Extender: [name]: 2 Visit Information History Since Last Visit Added or deleted any medications: No Patient Arrived: Walker Any new allergies or adverse reactions: No Arrival Time: 10:47 Had a fall or experienced change in No Transfer Assistance: None activities of daily living that may affect Patient Identification Verified: Yes risk of falls: Secondary Verification Process Completed: Yes Signs or symptoms of abuse/neglect since No Patient Requires Transmission-Based Precautions: No last visito Patient Has Alerts: Yes Hospitalized since last visit: No Patient Alerts: Patient on Blood Thinner Implantable device outside of the clinic No R ABI=Non compressible excluding cellular tissue based products placed in the center since last visit: Has Dressing in Place as Prescribed: Yes Has Compression in Place as Prescribed: Yes Has Footwear/Offloading in Place as Yes Prescribed: Left: Removable Cast Walker/Walking Boot Right: Surgical Shoe with Pressure Relief Insole Pain Present Now: No Electronic Signature(s) Signed: 04/09/2021 5:13:18 PM By: Lorrin Jackson Entered By: Lorrin Jackson on 04/09/2021 10:48:41 -------------------------------------------------------------------------------- Clinic Level of Care Assessment Details Patient Name: Date of Service: [name], Allen [name] 04/09/2021 10:30 A M Medical Record Number: [id number] Patient Account Number: [id number] Date of Birth/Sex: Treating RN: 1951/11/07 (70 y.o. [name] Primary Care Jennetta Flood: PA  [name], NO Other Clinician: Referring [place] Natarajan: Treating Tieshia Rettinger/Extender: [name]: 2 Clinic Level of Care Assessment Items TOOL 4 Quantity Score X- 1 0 Use when only an EandM is performed on FOLLOW-UP visit ASSESSMENTS - Nursing Assessment / Reassessment X- 1 10 Reassessment of Co-morbidities (includes updates in patient status) X- 1 5 Reassessment of Adherence to Treatment Plan ASSESSMENTS - Wound and Skin A ssessment / Reassessment '[]'  - 0 Simple Wound Assessment / Reassessment - one wound X- 4 5 Complex Wound Assessment / Reassessment - multiple wounds X- 1 10 Dermatologic / Skin Assessment (not related to wound area) ASSESSMENTS - Focused Assessment X- 2 5 Circumferential Edema Measurements - multi extremities X- 1 10 Nutritional Assessment / Counseling / Intervention '[]'  - 0 Lower Extremity Assessment (monofilament, tuning fork, pulses) '[]'  - 0 Peripheral Arterial Disease Assessment (using hand held doppler) ASSESSMENTS - Ostomy and/or Continence Assessment and Care '[]'  - 0 Incontinence Assessment and Management '[]'  - 0 Ostomy Care Assessment and Management (repouching, etc.) PROCESS - Coordination of Care '[]'  - 0 Simple Patient / Family Education for ongoing care X- 1 20 Complex (extensive) Patient / Family Education for ongoing care X- 1 10 Staff obtains Programmer, systems, Records, T Results / Process Orders est '[]'  - 0 Staff telephones HHA, Nursing Homes / Clarify orders / etc '[]'  - 0 Routine Transfer to another Facility (non-emergent condition) '[]'  - 0 Routine Hospital Admission (non-emergent condition) '[]'  - 0 New Admissions / Biomedical engineer / Ordering NPWT Apligraf, etc. , '[]'  - 0 Emergency Hospital Admission (emergent condition) '[]'  - 0 Simple Discharge Coordination X- 1 15 Complex (extensive) Discharge Coordination PROCESS - Special Needs '[]'  - 0 Pediatric / Minor Patient Management '[]'  - 0 Isolation Patient  Management '[]'  - 0 Hearing / Language / Visual special needs '[]'  - 0 Assessment of Community assistance (transportation, D/C planning, etc.) '[]'  - 0 Additional assistance / Altered  mentation '[]'  - 0 Support Surface(s) Assessment (bed, cushion, seat, etc.) INTERVENTIONS - Wound Cleansing / Measurement '[]'  - 0 Simple Wound Cleansing - one wound X- 4 5 Complex Wound Cleansing - multiple wounds X- 1 5 Wound Imaging (photographs - any number of wounds) '[]'  - 0 Wound Tracing (instead of photographs) '[]'  - 0 Simple Wound Measurement - one wound X- 4 5 Complex Wound Measurement - multiple wounds INTERVENTIONS - Wound Dressings '[]'  - 0 Small Wound Dressing one or multiple wounds '[]'  - 0 Medium Wound Dressing one or multiple wounds X- 2 20 Large Wound Dressing one or multiple wounds X- 1 5 Application of Medications - topical '[]'  - 0 Application of Medications - injection INTERVENTIONS - Miscellaneous '[]'  - 0 External ear exam '[]'  - 0 Specimen Collection (cultures, biopsies, blood, body fluids, etc.) '[]'  - 0 Specimen(s) / Culture(s) sent or taken to Lab for analysis '[]'  - 0 Patient Transfer (multiple staff / Civil Service fast streamer / Similar devices) '[]'  - 0 Simple Staple / Suture removal (25 or less) '[]'  - 0 Complex Staple / Suture removal (26 or more) '[]'  - 0 Hypo / Hyperglycemic Management (close monitor of Blood Glucose) '[]'  - 0 Ankle / Brachial Index (ABI) - do not check if billed separately X- 1 5 Vital Signs Has the patient been seen at the hospital within the last three years: Yes Total Score: 205 Level Of Care: New/Established - Level 5 Electronic Signature(s) Signed: 04/09/2021 6:31:57 PM By: Deon Pilling Entered By: Deon Pilling on 04/09/2021 12:06:23 -------------------------------------------------------------------------------- Encounter Discharge Information Details Patient Name: Date of Service: [name], Allen [name] 04/09/2021 10:30 A M Medical Record Number: [id number] Patient  Account Number: [id number] Date of Birth/Sex: Treating RN: Feb 06, 1951 (70 y.o. [name] Primary Care Tiwana Chavis: PA [name], [place] Other Clinician: Referring Kayla Weekes: Treating Dondi Aime/Extender: [name]: 2 Encounter Discharge Information Items Discharge Condition: Stable Ambulatory Status: Walker Discharge Destination: Home Transportation: Other Accompanied By: self Schedule Follow-up Appointment: Yes Clinical Summary of Care: Patient Declined Notes facility transportation Electronic Signature(s) Signed: 04/09/2021 5:26:54 PM By: Baruch Gouty RN, BSN Entered By: Baruch Gouty on 04/09/2021 12:30:01 -------------------------------------------------------------------------------- Lower Extremity Assessment Details Patient Name: Date of Service: [name], Allen [name] 04/09/2021 10:30 A M Medical Record Number: [id number] Patient Account Number: [id number] Date of Birth/Sex: Treating RN: [birth year] (70 y.o. [name] Primary Care Matilynn Dacey: PA TIENT, NO Other Clinician: Referring Shailen Thielen: Treating Isaul Landi/Extender: [name]: 2 Edema Assessment Assessed: [Left: Yes] [Right: Yes] Edema: [Left: Yes] [Right: Yes] Calf Left: Right: Point of Measurement: 39 cm From Medial Instep 38 cm 38.8 cm Ankle Left: Right: Point of Measurement: 13 cm From Medial Instep 24.5 cm 28 cm Vascular Assessment Pulses: Dorsalis Pedis Palpable: [Left:Yes] [Right:Yes] Electronic Signature(s) Signed: 04/09/2021 5:13:18 PM By: Lorrin Jackson Entered By: Lorrin Jackson on 04/09/2021 10:59:44 -------------------------------------------------------------------------------- Multi Wound Chart Details Patient Name: Date of Service: [name], Allen [name] 04/09/2021 10:30 A M Medical Record Number: [id number] Patient Account Number: [id number] Date of Birth/Sex: Treating RN: 1950-12-31 (70 y.o. [name] Primary  Care Janautica Netzley: PA [name], NO Other Clinician: Referring Jese Comella: Treating Romell Wolden/Extender: [name]: 2 Vital Signs Height(in): Pulse(bpm): 56 Weight(lbs): Blood Pressure(mmHg): 112/65 Body Mass Index(BMI): Temperature(F): 98.4 Respiratory Rate(breaths/min): 18 Photos: [2:No Photos Left, Anterior Lower Leg] [3:No Photos Right, Anterior Lower Leg] [4:No Photos Right, Posterior Lower Leg] Wound Location: [2:Gradually Appeared] [3:Other Lesion] [4:Other Lesion]  Wounding Event: [2:Lymphedema] [3:Lymphedema] [4:Lymphedema] Primary Etiology: [2:Cataracts, Arrhythmia, Type II] [3:Cataracts, Arrhythmia, Type II] [4:Cataracts, Arrhythmia, Type II] Comorbid History: [2:Diabetes, Osteoarthritis 02/18/2021] [3:Diabetes, Osteoarthritis 04/02/2021] [4:Diabetes, Osteoarthritis 04/02/2021] Date Acquired: [2:2] [3:1] [4:1] Weeks of Treatment: [2:Healed - Epithelialized] [3:Open] [4:Healed - Epithelialized] Wound Status: [2:0x0x0] [3:1.7x0.7x0.1] [4:0x0x0] Measurements L x W x D (cm) [2:0] [3:0.935] [4:0] A (cm) : rea [2:0] [3:0.093] [4:0] Volume (cm) : [2:100.00%] [3:52.40%] [4:100.00%] % Reduction in Area: [2:100.00%] [3:52.60%] [4:100.00%] % Reduction in Volume: [2:Full Thickness Without Exposed] [3:Full Thickness Without Exposed] [4:Full Thickness Without Exposed] Classification: [2:Support Structures N/A] [3:Support Structures Medium] [4:Support Structures N/A] Exudate A mount: [2:N/A] [3:Sanguinous] [4:N/A] Exudate Type: [2:N/A] [3:red] [4:N/A] Exudate Color: [2:N/A] [3:Distinct, outline attached] [4:N/A] Wound Margin: [2:N/A] [3:Large (67-100%)] [4:N/A] Granulation A mount: [2:N/A] [3:Red] [4:N/A] Granulation Quality: [2:N/A] [3:None Present (0%)] [4:N/A] Necrotic A mount: [2:N/A] [3:Medium (34-66%)] [4:N/A] Wound Number: 5 N/A N/A Photos: No Photos N/A N/A Left, Posterior Lower Leg N/A N/A Wound Location: Gradually Appeared N/A N/A Wounding  Event: Lymphedema N/A N/A Primary Etiology: Cataracts, Arrhythmia, Type II N/A N/A Comorbid History: Diabetes, Osteoarthritis 04/09/2021 N/A N/A Date Acquired: 0 N/A N/A Weeks of Treatment: Open N/A N/A Wound Status: 0.2x0.2x0.1 N/A N/A Measurements L x W x D (cm) 0.031 N/A N/A A (cm) : rea 0.003 N/A N/A Volume (cm) : N/A N/A N/A % Reduction in Area: N/A N/A N/A % Reduction in Volume: Full Thickness Without Exposed N/A N/A Classification: Support Structures Medium N/A N/A Exudate Amount: Serosanguineous N/A N/A Exudate Type: red, brown N/A N/A Exudate Color: Distinct, outline attached N/A N/A Wound Margin: Large (67-100%) N/A N/A Granulation Amount: Red N/A N/A Granulation Quality: None Present (0%) N/A N/A Necrotic Amount: Fat Layer (Subcutaneous Tissue): Yes N/A N/A Exposed Structures: Fascia: No Tendon: No Muscle: No Joint: No Bone: No None N/A N/A Epithelialization: Treatment Notes Electronic Signature(s) Signed: 04/09/2021 12:35:40 PM By: Kalman Shan DO Signed: 04/09/2021 6:31:57 PM By: Deon Pilling Entered By: Kalman Shan on 04/09/2021 12:27:01 -------------------------------------------------------------------------------- Multi-Disciplinary Care Plan Details Patient Name: Date of Service: [name], Allen [name] 04/09/2021 10:30 A M Medical Record Number: [id number] Patient Account Number: [id number] Date of Birth/Sex: Treating RN: [birth year] (70 y.o. [name] Primary Care Tajana Crotteau: PA [name], NO Other Clinician: Referring Asaph Serena: Treating Tenzin Edelman/Extender: [name]: 2 Active Inactive Wound/Skin Impairment Nursing Diagnoses: Impaired tissue integrity Knowledge deficit related to ulceration/compromised skin integrity Goals: Patient will have a decrease in wound volume by X% from date: (specify in notes) Date Initiated: 03/26/2021 Target Resolution Date: 05/08/2021 Goal Status:  Active Patient/caregiver will verbalize understanding of skin care regimen Date Initiated: 03/26/2021 Date Inactivated: 04/09/2021 Target Resolution Date: 04/18/2021 Goal Status: Met Ulcer/skin breakdown will have a volume reduction of 30% by week 4 Date Initiated: 03/26/2021 Target Resolution Date: 04/23/2021 Goal Status: Active Interventions: Assess patient/caregiver ability to obtain necessary supplies Assess patient/caregiver ability to perform ulcer/skin care regimen upon admission and as needed Assess ulceration(s) every visit Notes: Electronic Signature(s) Signed: 04/09/2021 6:31:57 PM By: Deon Pilling Entered By: Deon Pilling on 04/09/2021 10:56:28 -------------------------------------------------------------------------------- Pain Assessment Details Patient Name: Date of Service: [name], Allen [name] 04/09/2021 10:30 A M Medical Record Number: [id number] Patient Account Number: [id number] Date of Birth/Sex: Treating RN: December 25, 1950 (70 y.o. [name] Primary Care Nollie Terlizzi: PA [name], NO Other Clinician: Referring Jamani Eley: Treating Jeweliana Dudgeon/Extender: [name]: 2 Active Problems Location of Pain Severity and Description of Pain Patient Has Paino  No Site Locations Pain Management and Medication Current Pain Management: Electronic Signature(s) Signed: 04/09/2021 5:13:18 PM By: Lorrin Jackson Entered By: Lorrin Jackson on 04/09/2021 10:49:12 -------------------------------------------------------------------------------- Patient/Caregiver Education Details Patient Name: Date of Service: [name], Allen [place] 4/28/2022andnbsp10:30 [place] Record Number: [id number] Patient Account Number: [id number] Date of Birth/Gender: Treating RN: [birth year] (70 y.o. [name] Primary Care Physician: PA [name], NO Other Clinician: Referring Physician: Treating Physician/Extender: [name] in Treatment:  2 Education Assessment Education Provided To: Patient Education Topics Provided Wound/Skin Impairment: Handouts: Skin Care Do's and Dont's Methods: Explain/Verbal Responses: Reinforcements needed Electronic Signature(s) Signed: 04/09/2021 6:31:57 PM By: Deon Pilling Entered By: Deon Pilling on 04/09/2021 10:56:39 -------------------------------------------------------------------------------- Wound Assessment Details Patient Name: Date of Service: [name], Allen [name] 04/09/2021 10:30 A M Medical Record Number: [id number] Patient Account Number: [id number] Date of Birth/Sex: Treating RN: 10-05-1951 (70 y.o. [name] Primary Care Uri Turnbough: PA [name], NO Other Clinician: Referring Saralynn Langhorst: Treating Griffin Gerrard/Extender: [name]: 2 Wound Status Wound Number: 2 Primary Etiology: Lymphedema Wound Location: Left, Anterior Lower Leg Wound Status: Healed - Epithelialized Wounding Event: Gradually Appeared Comorbid History: Cataracts, Arrhythmia, Type II Diabetes, Osteoarthritis Date Acquired: 02/18/2021 Weeks Of Treatment: 2 Clustered Wound: No Wound Measurements Length: (cm) Width: (cm) Depth: (cm) Area: (cm) Volume: (cm) 0 % Reduction in Area: 100% 0 % Reduction in Volume: 100% 0 0 0 Wound Description Classification: Full Thickness Without Exposed Support Structur es Electronic Signature(s) Signed: 04/09/2021 5:13:18 PM By: Lorrin Jackson Entered By: Lorrin Jackson on 04/09/2021 11:07:43 -------------------------------------------------------------------------------- Wound Assessment Details Patient Name: Date of Service: [name], Allen [name] 04/09/2021 10:30 A M Medical Record Number: [id number] Patient Account Number: [id number] Date of Birth/Sex: Treating RN: 1951-11-09 (70 y.o. [name] Primary Care Genesis Paget: PA [name], NO Other Clinician: Referring Elijah Phommachanh: Treating Khamarion Bjelland/Extender: [name]: 2 Wound Status Wound Number: 3 Primary Etiology: Lymphedema Wound Location: Right, Anterior Lower Leg Wound Status: Open Wounding Event: Other Lesion Comorbid History: Cataracts, Arrhythmia, Type II Diabetes, Osteoarthritis Date Acquired: 04/02/2021 Weeks Of Treatment: 1 Clustered Wound: No Photos Wound Measurements Length: (cm) 1.7 Width: (cm) 0.7 Depth: (cm) 0.1 Area: (cm) 0.935 Volume: (cm) 0.093 % Reduction in Area: 52.4% % Reduction in Volume: 52.6% Epithelialization: Medium (34-66%) Tunneling: No Undermining: No Wound Description Classification: Full Thickness Without Exposed Support Structures Wound Margin: Distinct, outline attached Exudate Amount: Medium Exudate Type: Sanguinous Exudate Color: red Foul Odor After Cleansing: No Slough/Fibrino No Wound Bed Granulation Amount: Large (67-100%) Exposed Structure Granulation Quality: Red Fascia Exposed: No Necrotic Amount: None Present (0%) Fat Layer (Subcutaneous Tissue) Exposed: Yes Tendon Exposed: No Muscle Exposed: No Joint Exposed: No Bone Exposed: No Treatment Notes Wound #3 (Lower Leg) Wound Laterality: Right, Anterior Cleanser Soap and Water Discharge Instruction: May shower and wash wound with dial antibacterial soap and water prior to dressing change. Wound Cleanser Discharge Instruction: Cleanse the wound with wound cleanser prior to applying a clean dressing using gauze sponges, not tissue or cotton balls. Peri-Wound Care Triamcinolone 15 (g) Discharge Instruction: Apply liberally to both legs. Use triamcinolone 15 (g) as directed Sween Lotion (Moisturizing lotion) Discharge Instruction: Apply moisturizing lotion as directed Topical Primary Dressing Promogran Prisma Matrix, 4.34 (sq in) (silver collagen) Discharge Instruction: Moisten collagen with saline or hydrogel Secondary Dressing Woven Gauze Sponge, Non-Sterile 4x4 in Discharge Instruction: Apply  over primary dressing as directed. Secured With Compression Wrap Kerlix Roll  4.5x3.1 (in/yd) Discharge Instruction: ***Apply unna boot first layer to upper portion of lower leg.**Apply Kerlix and Coban compression as directed. Coban Self-Adherent Wrap 4x5 (in/yd) Discharge Instruction: Apply over Kerlix as directed. Compression Stockings Add-Ons Electronic Signature(s) Signed: 04/09/2021 5:13:18 PM By: Lorrin Jackson Signed: 04/10/2021 4:35:52 PM By: [name] Entered By: [name] on 04/09/2021 16:47:28 -------------------------------------------------------------------------------- Wound Assessment Details Patient Name: Date of Service: [name], Allen [name] 04/09/2021 10:30 A M Medical Record Number: [id number] Patient Account Number: [id number] Date of Birth/Sex: Treating RN: [birth year] (70 y.o. [name] Primary Care Trashaun Streight: PA [name], NO Other Clinician: Referring Palma Buster: Treating Roben Schliep/Extender: [name]: 2 Wound Status Wound Number: 4 Primary Etiology: Lymphedema Wound Location: Right, Posterior Lower Leg Wound Status: Healed - Epithelialized Wounding Event: Other Lesion Comorbid History: Cataracts, Arrhythmia, Type II Diabetes, Osteoarthritis Date Acquired: 04/02/2021 Weeks Of Treatment: 1 Clustered Wound: No Wound Measurements Length: (cm) Width: (cm) Depth: (cm) Area: (cm) Volume: (cm) 0 % Reduction in Area: 100% 0 % Reduction in Volume: 100% 0 0 0 Wound Description Classification: Full Thickness Without Exposed Support Structur es Electronic Signature(s) Signed: 04/09/2021 5:13:18 PM By: Lorrin Jackson Entered By: Lorrin Jackson on 04/09/2021 11:07:26 -------------------------------------------------------------------------------- Wound Assessment Details Patient Name: Date of Service: [name], Allen [name] 04/09/2021 10:30 A M Medical Record Number: [id number] Patient Account Number:  [id number] Date of Birth/Sex: Treating RN: 10-03-1951 (70 y.o. [name] Primary Care Izen Petz: PA [name], NO Other Clinician: Referring Janelle Culton: Treating Sabriah Hobbins/Extender: [name]: 2 Wound Status Wound Number: 5 Primary Etiology: Lymphedema Wound Location: Left, Posterior Lower Leg Wound Status: Open Wounding Event: Gradually Appeared Comorbid History: Cataracts, Arrhythmia, Type II Diabetes, Osteoarthritis Date Acquired: 04/09/2021 Weeks Of Treatment: 0 Clustered Wound: No Wound Measurements Length: (cm) 0.2 Width: (cm) 0.2 Depth: (cm) 0.1 Area: (cm) 0.031 Volume: (cm) 0.003 % Reduction in Area: % Reduction in Volume: Epithelialization: None Tunneling: No Undermining: No Wound Description Classification: Full Thickness Without Exposed Support Structures Wound Margin: Distinct, outline attached Exudate Amount: Medium Exudate Type: Serosanguineous Exudate Color: red, brown Foul Odor After Cleansing: No Slough/Fibrino No Wound Bed Granulation Amount: Large (67-100%) Exposed Structure Granulation Quality: Red Fascia Exposed: No Necrotic Amount: None Present (0%) Fat Layer (Subcutaneous Tissue) Exposed: Yes Tendon Exposed: No Muscle Exposed: No Joint Exposed: No Bone Exposed: No Treatment Notes Wound #5 (Lower Leg) Wound Laterality: Left, Posterior Cleanser Soap and Water Discharge Instruction: May shower and wash wound with dial antibacterial soap and water prior to dressing change. Wound Cleanser Discharge Instruction: Cleanse the wound with wound cleanser prior to applying a clean dressing using gauze sponges, not tissue or cotton balls. Peri-Wound Care Triamcinolone 15 (g) Discharge Instruction: Apply liberally to both legs. Use triamcinolone 15 (g) as directed Sween Lotion (Moisturizing lotion) Discharge Instruction: Apply moisturizing lotion as directed Topical Primary Dressing Promogran Prisma Matrix,  4.34 (sq in) (silver collagen) Discharge Instruction: Moisten collagen with saline or hydrogel Secondary Dressing Woven Gauze Sponge, Non-Sterile 4x4 in Discharge Instruction: Apply over primary dressing as directed. Secured With Compression Wrap Kerlix Roll 4.5x3.1 (in/yd) Discharge Instruction: ***Apply unna boot first layer to upper portion of lower leg.**Apply Kerlix and Coban compression as directed. Coban Self-Adherent Wrap 4x5 (in/yd) Discharge Instruction: Apply over Kerlix as directed. Compression Stockings Add-Ons Electronic Signature(s) Signed: 04/09/2021 5:13:18 PM By: Lorrin Jackson Entered By: Lorrin Jackson on 04/09/2021 11:05:37 -------------------------------------------------------------------------------- Vitals Details Patient Name: Date of Service: [place], Allen [name]  04/09/2021 10:30 A M Medical Record Number: [id number] Patient Account Number: [id number] Date of Birth/Sex: Treating RN: [birth year] (70 y.o. [name] Primary Care Darrell Hauk: PA TIENT, NO Other Clinician: Referring Marcie Shearon: Treating Kamarion Zagami/Extender: [name]: 2 Vital Signs Time Taken: 10:48 Temperature (F): 98.4 Pulse (bpm): 56 Respiratory Rate (breaths/min): 18 Blood Pressure (mmHg): 112/65 Reference Range: 80 - 120 mg / dl Electronic Signature(s) Signed: 04/09/2021 5:13:18 PM By: Lorrin Jackson Entered By: Lorrin Jackson on 04/09/2021 10:53:19

## 2021-04-16 ENCOUNTER — Other Ambulatory Visit: Payer: Self-pay

## 2021-04-16 ENCOUNTER — Encounter (HOSPITAL_BASED_OUTPATIENT_CLINIC_OR_DEPARTMENT_OTHER): Payer: Medicare Other | Attending: Internal Medicine | Admitting: Internal Medicine

## 2021-04-16 DIAGNOSIS — L97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 with unspecified severity: Secondary | ICD-10-CM

## 2021-04-16 DIAGNOSIS — E11622 Type 2 diabetes mellitus with other skin ulcer: Secondary | ICD-10-CM | POA: Diagnosis not present

## 2021-04-16 DIAGNOSIS — Z87891 Personal history of nicotine dependence: Secondary | ICD-10-CM | POA: Diagnosis not present

## 2021-04-16 DIAGNOSIS — L97829 Non-pressure chronic ulcer of other part of left lower leg with unspecified severity: Secondary | ICD-10-CM | POA: Diagnosis not present

## 2021-04-16 DIAGNOSIS — I872 Venous insufficiency (chronic) (peripheral): Secondary | ICD-10-CM | POA: Diagnosis not present

## 2021-04-16 DIAGNOSIS — I890 Lymphedema, not elsewhere classified: Secondary | ICD-10-CM | POA: Diagnosis not present

## 2021-04-16 NOTE — Progress Notes (Signed)
ABEM, STPAUL ([id number]) Visit Report for 04/16/2021 Chief Complaint Document Details Patient Name: Date of Service: [name] 04/16/2021 10:15 A M Medical Record Number: [id number] Patient Account Number: [id number] Date of Birth/Sex: Treating RN: 1951-06-30 (70 y.o. [name] Primary Care Provider: PA [name], NO Other Clinician: Referring Provider: Treating Provider/Extender: [name]: 3 Information Obtained from: Patient Chief Complaint bilateral lower extremity swelling with wounds Electronic Signature(s) Signed: 04/16/2021 3:45:45 PM By: Kalman Shan DO Entered By: Kalman Shan on 04/16/2021 11:11:18 -------------------------------------------------------------------------------- HPI Details Patient Name: Date of Service: [name], Allen [name] 04/16/2021 10:15 A M Medical Record Number: [id number] Patient Account Number: [id number] Date of Birth/Sex: Treating RN: [birth year] (70 y.o. [name] Primary Care Provider: PA [name], NO Other Clinician: Referring Provider: Treating Provider/Extender: [name]: 3 History of Present Illness HPI Description: [name] is a 70 year old male with a past medical history of type 2 diabetes and essential hypertension who presents to the clinic today for bilateral lower extremity wounds. He was recently seen in the ED on 3/9 for bilateral lower extremity edema. He had a DVT study that was negative for clots. He was given triamcinolone cream and advised to elevate legs and obtain compression stockings. He has been using the triamcinolone cream however he is unable to use compression stockings due to difficulty of putting on. Patient states that for the past 2 years he has had opening and closing of weeping wounds to his legs bilaterally. They spontaneously open and close. They are often open for several weeks before they heal  spontaneously. He denies any purulent drainage, increased warmth or increased erythema to the skin. 4/21; patient presents for 1 week follow-up. The wounds on the right leg have healed however he has now 2 new wounds 1 on the anterior and posterior thigh that occurred from him scratching underneath the wrap. The left leg wound has improved and almost closed 4/28; patient presents for 1 week follow-up. He continues to have a wound on the right anterior leg that is showing signs of healing. The posterior right thigh has closed. He now has a new wound to the left leg that is limited to skin breakdown. 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 The wounds are occurring because he goes underneath the leg wraps and scratches creating new wounds. He has no complaints today. 5/5; patient presents for 1 week follow-up. He no longer has any open wounds to his lower extremities bilaterally. He continues to have scattered excoriation marks from where he scratches. These are scabbed over. He states he is going to see dermatology this month. Overall he is doing well and is happy with his care. Electronic Signature(s) Signed: 04/16/2021 3:45:45 PM By: Kalman Shan DO Entered By: Kalman Shan on 04/16/2021 11:12:41 -------------------------------------------------------------------------------- Physical Exam Details Patient Name: Date of Service: [name], Allen [name] 04/16/2021 10:15 A M Medical Record Number: [id number] Patient Account Number: [id number] Date of Birth/Sex: Treating RN: 1951-07-20 (70 y.o. [name] Primary Care Provider: PA [name], NO Other Clinician: Referring Provider: Treating Provider/Extender: [name]: 3 Constitutional respirations regular, non-labored and within target range for patient.[name] Psychiatric pleasant and cooperative. Notes Lower extremities bilaterally  with no open wounds. There is 2+ pitting edema to his right leg. He has scratch marks throughout his upper right leg.  No signs of infection. Venous stasis dermatitis bilaterally. Electronic Signature(s) Signed: 04/16/2021 3:45:45 PM By: Kalman Shan DO Entered By: Kalman Shan on 04/16/2021 11:24:45 -------------------------------------------------------------------------------- Physician Orders Details Patient Name: Date of Service: [name], Allen [name] 04/16/2021 10:15 A M Medical Record Number: [id number] Patient Account Number: [id number] Date of Birth/Sex: Treating RN: 17-Oct-1951 (70 y.o. 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 - Call if any future wound care needs. Edema Control - Lymphedema / SCD / Other Elevate legs to the level of the heart or above for 30 minutes daily and/or when sitting, a frequency of: - throughout the day. Avoid standing for long periods of time. Patient to wear own compression stockings every day. Exercise regularly Moisturize legs daily. Compression stocking or Garment 10-20 mm/Hg pressure to: - patient to apply juxtalites HD to both legs remove daily when ready for bed and when up for the day apply to both legs. Additional Orders / Instructions Other: - No need to schedule arterial studies with [facility] at this time. Electronic Signature(s) Signed: 04/16/2021 3:45:45 PM By: Kalman Shan DO Signed:  04/16/2021 6:21:36 PM By: Deon Pilling Entered By: Deon Pilling on 04/16/2021 12:51:49 -------------------------------------------------------------------------------- Problem List Details Patient Name: Date of Service: [name], Allen [name] 04/16/2021 10:15 A M Medical Record Number: [id number] Patient Account Number: [id number] Date of Birth/Sex: Treating RN: [birth year] (70 y.o. 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 with unspecified 03/26/2021 No Yes severity L97.829 Non-pressure chronic ulcer of other part of left lower leg with unspecified 03/26/2021 No Yes severity I89.0 Lymphedema, not elsewhere classified 03/26/2021 No Yes I87.2 Venous insufficiency (chronic) (peripheral) 03/26/2021 No Yes Inactive Problems Resolved Problems Electronic Signature(s) Signed: 04/16/2021 3:45:45 PM By: Kalman Shan DO Entered By: Kalman Shan on 04/16/2021 11:10:54 -------------------------------------------------------------------------------- Progress Note Details Patient Name: Date of Service: [name], Allen [name] 04/16/2021 10:15 A M Medical Record Number: [id number] Patient Account Number: [id number] Date of Birth/Sex: Treating RN: 1951/08/28 (70 y.o. [name] Primary Care Provider: PA [name], NO Other Clinician: Referring Provider: Treating Provider/Extender: [name]: 3 Subjective Chief Complaint Information obtained from Patient bilateral lower extremity swelling with wounds History of Present Illness (HPI) Keatan Vandevoort is a 70 year old male with a past medical history of type 2 diabetes and essential hypertension who presents to the clinic today for  bilateral lower extremity wounds. He was recently seen in the ED on 3/9 for bilateral lower extremity edema. He had a DVT study that was negative for clots. He was given triamcinolone cream and advised to elevate legs and obtain compression stockings. He has been using the triamcinolone cream however he is unable to use compression stockings due to difficulty of putting on. Patient states that for the past 2 years he has had opening and closing of weeping wounds to  his legs bilaterally. They spontaneously open and close. They are often open for several weeks before they heal spontaneously. He denies any purulent drainage, increased warmth or increased erythema to the skin. 4/21; patient presents for 1 week follow-up. The wounds on the right leg have healed however he has now 2 new wounds 1 on the anterior and posterior thigh that occurred from him scratching underneath the wrap. The left leg wound has improved and almost closed 4/28; patient presents for 1 week follow-up. He continues to have a wound on the right anterior leg that is showing signs of healing. The posterior right thigh has closed. He now has a new wound to the left leg that is limited to skin breakdown. 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 The wounds are occurring because he goes underneath the leg wraps and scratches creating new wounds. He has no complaints today. 5/5; patient presents for 1 week follow-up. He no longer has any open wounds to his lower extremities bilaterally. He continues to have scattered excoriation marks from where he scratches. These are scabbed over. He states he is going to see dermatology this month. Overall he is doing well and is happy with his care. Patient History Unable to Obtain Patient History due to Altered Mental Status. Information obtained from Patient. Family History Cancer - Mother,Maternal Grandparents, Heart  Disease - Maternal Grandparents, Hypertension - Maternal Grandparents, No family history of Diabetes, Hereditary Spherocytosis. Social History Former smoker - ended on 09/14/1983, Alcohol Use - Rarely, Drug Use - No History, Caffeine Use - Never. Medical History Eyes Patient has history of Cataracts - 2014 Cardiovascular Patient has history of Arrhythmia - A-Fib Endocrine Patient has history of Type II Diabetes Musculoskeletal Patient has history of Osteoarthritis Medical A Surgical History Notes nd Hematologic/Lymphatic Hyperbilirubinemia Gastrointestinal Rectal Pain-Constipation Genitourinary CKD stage 3-BPH Oncologic Colon Cancer-Colon Re-section Objective Constitutional respirations regular, non-labored and within target range for patient.. Vitals Time Taken: 10:30 AM, Temperature: 97.9 F, Pulse: 74 bpm, Respiratory Rate: 18 breaths/min, Blood Pressure: 129/81 mmHg. Psychiatric pleasant and cooperative. General Notes: Lower extremities bilaterally with no open wounds. There is 2+ pitting edema to his right leg. He has scratch marks throughout his upper right leg. No signs of infection. Venous stasis dermatitis bilaterally. Integumentary (Hair, Skin) Wound #3 status is Open. Original cause of wound was Other Lesion. The date acquired was: 04/02/2021. The wound has been in treatment 2 weeks. The wound is located on the Right,Anterior Lower Leg. The wound measures 0cm length x 0cm width x 0cm depth; 0cm^2 area and 0cm^3 volume. Wound #5 status is Open. Original cause of wound was Gradually Appeared. The date acquired was: 04/09/2021. The wound has been in treatment 1 weeks. The wound is located on the Left,Posterior Lower Leg. The wound measures 0cm length x 0cm width x 0cm depth; 0cm^2 area and 0cm^3 volume. 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 There is epithelialization over the previous wound sites. He continues to have swelling to his right lower extremity greater than his left. He has his juxta lite compressions today and we discussed at length how to use these (15-53mmHg). I also recommended he lotion his legs daily. And keep them elevated when he is sitting.  He never obtained ABIs. He has follow-up with dermatology for his chronic itching to his lower extremities. Plan Discharge From [facility] Services: Discharge from [place] - Call if any future wound care needs. Edema Control - Lymphedema / SCD / Other: Elevate legs to the level of the heart or above for 30 minutes daily and/or when sitting, a frequency of: - throughout the day. Avoid standing for long periods of time. Patient to wear own compression stockings every day. Exercise regularly Moisturize legs daily. Compression stocking or Garment 10-20 mm/Hg pressure to: - patient to apply juxtalites HD to both legs remove daily when ready for bed and when up for the day apply to both legs. Additional Orders / Instructions: Other: - No need to schedule arterial studies with [facility] at this time. 1. Discharge from our clinic today. 2. He can call us with any questions and concerns and happy to see him. Electronic Signature(s) Signed: 04/16/2021 3:45:45 PM By: Kalman Shan DO Signed: 04/16/2021 6:21:36 PM By: Deon Pilling Entered By: Deon Pilling on 04/16/2021 12:52:02 -------------------------------------------------------------------------------- HxROS Details Patient Name: Date of Service: [name], Allen [name] 04/16/2021 10:15 A M Medical Record Number: [id number] Patient Account Number: [id number] Date of Birth/Sex: Treating RN: Dec 01, 1951 (70 y.o. [name] Primary Care Provider: PA [name], NO Other  Clinician: Referring Provider: Treating Provider/Extender: [name]: 3 Unable to Obtain Patient History due to Altered Mental Status Information Obtained From Patient Eyes Medical History: Positive for: Cataracts - 2014 Hematologic/Lymphatic Medical History: Past Medical History Notes: Hyperbilirubinemia Cardiovascular Medical History: Positive for: Arrhythmia - A-Fib Gastrointestinal Medical History: Past Medical History Notes: Rectal Pain-Constipation Endocrine Medical History: Positive for: Type II Diabetes Time with diabetes: Since 2014 Treated with: Oral agents Blood sugar tested every day: Yes Tested : Genitourinary Medical History: Past Medical History Notes: CKD stage 3-BPH Musculoskeletal Medical History: Positive for: Osteoarthritis Oncologic Medical History: Past Medical History Notes: Colon Cancer-Colon Re-section HBO Extended History Items Eyes: Cataracts Immunizations Pneumococcal Vaccine: Received Pneumococcal Vaccination: No Implantable Devices None Family and Social History Cancer: Yes - Mother,Maternal Grandparents; Diabetes: No; Heart Disease: Yes - Maternal Grandparents; Hereditary Spherocytosis: No; Hypertension: Yes - Maternal Grandparents; Former smoker - ended on 09/14/1983; Alcohol Use: Rarely; Drug Use: No History; Caffeine Use: Never; Financial Concerns: No; Food, Clothing or Shelter Needs: No; Support System Lacking: No; Transportation Concerns: No Electronic Signature(s) Signed: 04/16/2021 3:45:45 PM By: Kalman Shan DO Signed: 04/16/2021 6:21:36 PM By: Deon Pilling Entered By: Kalman Shan on 04/16/2021 11:12:50 -------------------------------------------------------------------------------- SuperBill Details Patient Name: Date of Service: [name], Allen [name] 04/16/2021 Medical Record Number: [id number] Patient Account Number: [id number] Date of Birth/Sex: Treating RN: 12-07-1951 (70  y.o. [name] Primary Care Provider: PA TIENT, NO Other Clinician: Referring Provider: Treating Provider/Extender: [name]: 3 Diagnosis Coding ICD-10 Codes Code Description L97.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 with unspecified severity L97.829 Non-pressure chronic ulcer of other part of left lower leg with unspecified severity I89.0 Lymphedema, not elsewhere classified I87.2 Venous insufficiency (chronic) (peripheral) Facility Procedures CPT4 Code: [id number] Description: 99214 - WOUND CARE VISIT-LEV 4 EST PT Modifier: Quantity: 1 Physician Procedures : CPT4 Code Description Modifier [id number] - WC PHYS LEVEL 3 - EST PT ICD-10 Diagnosis Description L97.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 with unspecified severity L97.829 Non-pressure chronic ulcer of other part of left  lower leg with  unspecified severity I89.0 Lymphedema, not elsewhere classified I87.2 Venous insufficiency (chronic) (peripheral) Quantity: 1 Electronic Signature(s) Signed: 04/16/2021 3:45:45 PM By: Kalman Shan DO Entered By: Kalman Shan on 04/16/2021 11:25:18

## 2021-04-17 NOTE — Progress Notes (Signed)
COUPER, JUNCAJ ([id number]) Visit Report for 04/16/2021 Arrival Information Details Patient Name: Date of Service: [name] 04/16/2021 10:15 A M Medical Record Number: [id number] Patient Account Number: [id number] Date of Birth/Sex: Treating RN: 10/28/1951 (70 y.o. [name] Primary Care Danny Yackley: PA [name], NO Other Clinician: Referring Tavares Levinson: Treating Tristin Gladman/Extender: [name]: 3 Visit Information History Since Last Visit Added or deleted any medications: No Patient Arrived: Walker Any new allergies or adverse reactions: No Arrival Time: 10:29 Had a fall or experienced change in No Accompanied By: self activities of daily living that may affect Transfer Assistance: None risk of falls: Patient Requires Transmission-Based Precautions: No Signs or symptoms of abuse/neglect since last visito No Patient Has Alerts: Yes Hospitalized since last visit: No Patient Alerts: Patient on Blood Thinner Implantable device outside of the clinic excluding No R ABI=Non compressible cellular tissue based products placed in the center since last visit: Has Dressing in Place as Prescribed: Yes Pain Present Now: No Electronic Signature(s) Signed: 04/16/2021 2:08:59 PM By: [name] Entered By: [name] on 04/16/2021 10:30:47 -------------------------------------------------------------------------------- Clinic Level of Care Assessment Details Patient Name: Date of Service: [name] [name] 04/16/2021 10:15 A M Medical Record Number: [id number] Patient Account Number: [id number] Date of Birth/Sex: Treating RN: 11-19-1951 (9 y.o. [name] Primary Care Dortha Neighbors: PA [name], NO Other Clinician: Referring Maleeha Halls: Treating Zaeda Mcferran/Extender: [name]: 3 Clinic Level of Care Assessment Items TOOL 4 Quantity Score X- 1 0 Use when only an EandM is performed on FOLLOW-UP  visit ASSESSMENTS - Nursing Assessment / Reassessment X- 1 10 Reassessment of Co-morbidities (includes updates in patient status) X- 1 5 Reassessment of Adherence to Treatment Plan ASSESSMENTS - Wound and Skin A ssessment / Reassessment []  - 0 Simple Wound Assessment / Reassessment - one wound X- 2 5 Complex Wound Assessment / Reassessment - multiple wounds X- 1 10 Dermatologic / Skin Assessment (not related to wound area) ASSESSMENTS - Focused Assessment X- 2 5 Circumferential Edema Measurements - multi extremities X- 1 10 Nutritional Assessment / Counseling / Intervention []  - 0 Lower Extremity Assessment (monofilament, tuning fork, pulses) []  - 0 Peripheral Arterial Disease Assessment (using hand held doppler) ASSESSMENTS - Ostomy and/or Continence Assessment and Care []  - 0 Incontinence Assessment and Management []  - 0 Ostomy Care Assessment and Management (repouching, etc.) PROCESS - Coordination of Care []  - 0 Simple Patient / Family Education for ongoing care X- 1 20 Complex (extensive) Patient / Family Education for ongoing care X- 1 10 Staff obtains Programmer, systems, Records, T Results / Process Orders est []  - 0 Staff telephones HHA, Nursing Homes / Clarify orders / etc []  - 0 Routine Transfer to another Facility (non-emergent condition) []  - 0 Routine Hospital Admission (non-emergent condition) []  - 0 New Admissions / Biomedical engineer / Ordering NPWT Apligraf, etc. , []  - 0 Emergency Hospital Admission (emergent condition) []  - 0 Simple Discharge Coordination X- 1 15 Complex (extensive) Discharge Coordination PROCESS - Special Needs []  - 0 Pediatric / Minor Patient Management []  - 0 Isolation Patient Management []  - 0 Hearing / Language / Visual special needs []  - 0 Assessment of Community assistance (transportation, D/C planning, etc.) []  - 0 Additional assistance / Altered mentation []  - 0 Support Surface(s) Assessment (bed, cushion, seat,  etc.) INTERVENTIONS - Wound Cleansing / Measurement []  - 0 Simple Wound Cleansing - one wound X- 2 5 Complex Wound Cleansing -  multiple wounds X- 1 5 Wound Imaging (photographs - any number of wounds) []  - 0 Wound Tracing (instead of photographs) []  - 0 Simple Wound Measurement - one wound X- 2 5 Complex Wound Measurement - multiple wounds INTERVENTIONS - Wound Dressings []  - 0 Small Wound Dressing one or multiple wounds []  - 0 Medium Wound Dressing one or multiple wounds []  - 0 Large Wound Dressing one or multiple wounds []  - 0 Application of Medications - topical []  - 0 Application of Medications - injection INTERVENTIONS - Miscellaneous []  - 0 External ear exam []  - 0 Specimen Collection (cultures, biopsies, blood, body fluids, etc.) []  - 0 Specimen(s) / Culture(s) sent or taken to Lab for analysis []  - 0 Patient Transfer (multiple staff / Civil Service fast streamer / Similar devices) []  - 0 Simple Staple / Suture removal (25 or less) []  - 0 Complex Staple / Suture removal (26 or more) []  - 0 Hypo / Hyperglycemic Management (close monitor of Blood Glucose) []  - 0 Ankle / Brachial Index (ABI) - do not check if billed separately X- 1 5 Vital Signs Has the patient been seen at the hospital within the last three years: Yes Total Score: 130 Level Of Care: New/Established - Level 4 Electronic Signature(s) Signed: 04/16/2021 6:21:36 PM By: Deon Pilling Entered By: Deon Pilling on 04/16/2021 10:58:40 -------------------------------------------------------------------------------- Encounter Discharge Information Details Patient Name: Date of Service: [name] [name] 04/16/2021 10:15 A M Medical Record Number: [id number] Patient Account Number: [id number] Date of Birth/Sex: Treating RN: 1951/03/07 (70 y.o. [name] Primary Care Shandiin Eisenbeis: PA [name], [place] Other Clinician: Referring Caitlain Tweed: Treating Kamali Sakata/Extender: [name] in Treatment:  3 Encounter Discharge Information Items Discharge Condition: Stable Ambulatory Status: Walker Discharge Destination: Home Transportation: Other Accompanied By: self Schedule Follow-up Appointment: Yes Clinical Summary of Care: Patient Declined Notes transportation service Electronic Signature(s) Signed: 04/17/2021 5:11:03 PM By: Baruch Gouty RN, BSN Entered By: Baruch Gouty on 04/16/2021 11:39:54 -------------------------------------------------------------------------------- Lower Extremity Assessment Details Patient Name: Date of Service: [name] [name] 04/16/2021 10:15 A M Medical Record Number: [id number] Patient Account Number: [id number] Date of Birth/Sex: Treating RN: 1951-09-16 (70 y.o. [name] Primary Care Kevork Joyce: PA TIENT, NO Other Clinician: Referring Desmund Elman: Treating Ramatoulaye Pack/Extender: [name]: 3 Edema Assessment Assessed: [Left: Yes] [Right: Yes] Edema: [Left: Yes] [Right: Yes] Calf Left: Right: Point of Measurement: 39 cm From Medial Instep 38 cm 38 cm Ankle Left: Right: Point of Measurement: 13 cm From Medial Instep 22 cm 24 cm Vascular Assessment Pulses: Dorsalis Pedis Palpable: [Left:Yes] [Right:Yes] Electronic Signature(s) Signed: 04/16/2021 6:21:36 PM By: Deon Pilling Entered By: Deon Pilling on 04/16/2021 10:48:50 -------------------------------------------------------------------------------- Multi Wound Chart Details Patient Name: Date of Service: [name] [name] 04/16/2021 10:15 A M Medical Record Number: [id number] Patient Account Number: [id number] Date of Birth/Sex: Treating RN: [birth year] (71 y.o. [name] Primary Care Archana Eckman: PA [name], NO Other Clinician: Referring Haden Cavenaugh: Treating Loany Neuroth/Extender: [name]: 3 Vital Signs Height(in): Pulse(bpm): 74 Weight(lbs): Blood Pressure(mmHg): 129/81 Body Mass  Index(BMI): Temperature(F): 97.9 Respiratory Rate(breaths/min): 18 Photos: [3:No Photos Right, Anterior Lower Leg] [5:No Photos Left, Posterior Lower Leg] [N/A:N/A N/A] Wound Location: [3:Other Lesion] [5:Gradually Appeared] [N/A:N/A] Wounding Event: [3:Lymphedema] [5:Lymphedema] [N/A:N/A] Primary Etiology: [3:04/02/2021] [5:04/09/2021] [N/A:N/A] Date Acquired: [3:2] [5:1] [N/A:N/A] Weeks of Treatment: [3:Open] [5:Open] [N/A:N/A] Wound Status: [3:0x0x0] [5:0x0x0] [N/A:N/A] Measurements L x W x D (cm) [3:0] [5:0] [N/A:N/A] A (cm) :  rea [3:0] [5:0] [N/A:N/A] Volume (cm) : [3:100.00%] [5:100.00%] [N/A:N/A] % Reduction in A rea: [3:100.00%] [5:100.00%] [N/A:N/A] % Reduction in Volume: [3:Full Thickness Without Exposed] [5:Full Thickness Without Exposed] [N/A:N/A] Classification: [3:Support Structures] [5:Support Structures] Treatment Notes Electronic Signature(s) Signed: 04/16/2021 3:45:45 PM By: Kalman Shan DO Signed: 04/16/2021 6:21:36 PM By: Deon Pilling Entered By: Kalman Shan on 04/16/2021 11:11:08 -------------------------------------------------------------------------------- Multi-Disciplinary Care Plan Details Patient Name: Date of Service: [name] [name] 04/16/2021 10:15 A M Medical Record Number: [id number] Patient Account Number: [id number] Date of Birth/Sex: Treating RN: 10-16-1951 (70 y.o. [name] Primary Care Aasiyah Auerbach: PA Darnelle Spangle Other Clinician: Referring Sylvi Rybolt: Treating Naviah Belfield/Extender: [name]: 3 Active Inactive Electronic Signature(s) Signed: 04/16/2021 6:21:36 PM By: Deon Pilling Entered By: Deon Pilling on 04/16/2021 10:49:30 -------------------------------------------------------------------------------- Pain Assessment Details Patient Name: Date of Service: [name] [name] 04/16/2021 10:15 A M Medical Record Number: [id number] Patient Account Number: [id number] Date of  Birth/Sex: Treating RN: [birth year] (70 y.o. [name] Primary Care Paydon Carll: PA [name], NO Other Clinician: Referring Mohmed Farver: Treating Raylie Maddison/Extender: [name]: 3 Active Problems Location of Pain Severity and Description of Pain Patient Has Paino No Site Locations Pain Management and Medication Current Pain Management: Electronic Signature(s) Signed: 04/16/2021 2:08:59 PM By: [name] Signed: 04/16/2021 6:21:36 PM By: Deon Pilling Entered By: [name] on 04/16/2021 10:31:10 -------------------------------------------------------------------------------- Patient/Caregiver Education Details Patient Name: Date of Service: Allen Spray, [place] 5/5/2022andnbsp10:15 [place] Record Number: [id number] Patient Account Number: [id number] Date of Birth/Gender: Treating RN: 01/20/1951 (70 y.o. [name] Primary Care Physician: PA [name], NO Other Clinician: Referring Physician: Treating Physician/Extender: [name] in Treatment: 3 Education Assessment Education Provided To: Patient Education Topics Provided Wound/Skin Impairment: Handouts: Skin Care Do's and Dont's Methods: Explain/Verbal Responses: Reinforcements needed Electronic Signature(s) Signed: 04/16/2021 6:21:36 PM By: Deon Pilling Entered By: Deon Pilling on 04/16/2021 10:49:42 -------------------------------------------------------------------------------- Wound Assessment Details Patient Name: Date of Service: [name] [name] 04/16/2021 10:15 A M Medical Record Number: [id number] Patient Account Number: [id number] Date of Birth/Sex: Treating RN: May 07, 1951 (70 y.o. [name] Primary Care Rashee Marschall: PA TIENT, NO Other Clinician: Referring Lunetta Marina: Treating Renita Brocks/Extender: [name]: 3 Wound Status Wound Number: 3 Primary Etiology: Lymphedema Wound Location: Right,  Anterior Lower Leg Wound Status: Open Wounding Event: Other Lesion Date Acquired: 04/02/2021 Weeks Of Treatment: 2 Clustered Wound: No Wound Measurements Length: (cm) Width: (cm) Depth: (cm) Area: (cm) Volume: (cm) 0 % Reduction in Area: 100% 0 % Reduction in Volume: 100% 0 0 0 Wound Description Classification: Full Thickness Without Exposed Support Structur es Electronic Signature(s) Signed: 04/16/2021 2:08:59 PM By: [name] Signed: 04/16/2021 6:21:36 PM By: Deon Pilling Entered By: [name] on 04/16/2021 10:43:02 -------------------------------------------------------------------------------- Wound Assessment Details Patient Name: Date of Service: [name] [name] 04/16/2021 10:15 A M Medical Record Number: [id number] Patient Account Number: [id number] Date of Birth/Sex: Treating RN: 1951/04/06 (70 y.o. [name] Primary Care Ashrita Chrismer: PA TIENT, NO Other Clinician: Referring Machael Raine: Treating Kaivon Livesey/Extender: [name]: 3 Wound Status Wound Number: 5 Primary Etiology: Lymphedema Wound Location: Left, Posterior Lower Leg Wound Status: Open Wounding Event: Gradually Appeared Date Acquired: 04/09/2021 Weeks Of Treatment: 1 Clustered Wound: No Wound Measurements Length: (cm) Width: (cm) Depth: (cm) Area: (cm) Volume: (cm) 0 % Reduction in Area: 100% 0 % Reduction in  Volume: 100% 0 0 0 Wound Description Classification: Full Thickness Without Exposed Support Structur es Electronic Signature(s) Signed: 04/16/2021 2:08:59 PM By: [name] Signed: 04/16/2021 6:21:36 PM By: Deon Pilling Entered By: [name] on 04/16/2021 10:43:02 -------------------------------------------------------------------------------- Vitals Details Patient Name: Date of Service: [name] [name] 04/16/2021 10:15 A M Medical Record Number: [id number] Patient Account Number: [id number] Date of  Birth/Sex: Treating RN: 02-Jan-1951 (70 y.o. [name] Primary Care Watson Robarge: PA TIENT, NO Other Clinician: Referring Eugina Row: Treating Zyire Eidson/Extender: [name]: 3 Vital Signs Time Taken: 10:30 Temperature (F): 97.9 Pulse (bpm): 74 Respiratory Rate (breaths/min): 18 Blood Pressure (mmHg): 129/81 Reference Range: 80 - 120 mg / dl Electronic Signature(s) Signed: 04/16/2021 2:08:59 PM By: [name] Entered By: [name] on 04/16/2021 10:31:04

## 2021-05-22 ENCOUNTER — Encounter (HOSPITAL_BASED_OUTPATIENT_CLINIC_OR_DEPARTMENT_OTHER): Payer: Medicare Other | Attending: Internal Medicine | Admitting: Internal Medicine

## 2021-05-22 ENCOUNTER — Other Ambulatory Visit: Payer: Self-pay

## 2021-05-22 DIAGNOSIS — I129 Hypertensive chronic kidney disease with stage 1 through stage 4 chronic kidney disease, or unspecified chronic kidney disease: Secondary | ICD-10-CM | POA: Insufficient documentation

## 2021-05-22 DIAGNOSIS — E1122 Type 2 diabetes mellitus with diabetic chronic kidney disease: Secondary | ICD-10-CM | POA: Diagnosis not present

## 2021-05-22 DIAGNOSIS — Z87891 Personal history of nicotine dependence: Secondary | ICD-10-CM | POA: Diagnosis not present

## 2021-05-22 DIAGNOSIS — I4891 Unspecified atrial fibrillation: Secondary | ICD-10-CM | POA: Insufficient documentation

## 2021-05-22 DIAGNOSIS — I890 Lymphedema, not elsewhere classified: Secondary | ICD-10-CM | POA: Diagnosis not present

## 2021-05-22 DIAGNOSIS — L03115 Cellulitis of right lower limb: Secondary | ICD-10-CM | POA: Insufficient documentation

## 2021-05-22 DIAGNOSIS — I872 Venous insufficiency (chronic) (peripheral): Secondary | ICD-10-CM | POA: Diagnosis not present

## 2021-05-22 DIAGNOSIS — E11622 Type 2 diabetes mellitus with other skin ulcer: Secondary | ICD-10-CM | POA: Insufficient documentation

## 2021-05-22 DIAGNOSIS — L97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 with unspecified severity: Secondary | ICD-10-CM

## 2021-05-22 DIAGNOSIS — N183 Chronic kidney disease, stage 3 unspecified: Secondary | ICD-10-CM | POA: Insufficient documentation

## 2021-05-22 DIAGNOSIS — L97829 Non-pressure chronic ulcer of other part of left lower leg with unspecified severity: Secondary | ICD-10-CM | POA: Diagnosis not present

## 2021-05-22 NOTE — Progress Notes (Signed)
[name], [name] ([id number]) Visit Report for 05/22/2021 Arrival Information Details Patient Name: Date of Service: [name] 05/22/2021 10:45 A M Medical Record Number: [id number] Patient Account Number: [id number] Date of Birth/Sex: Treating RN: 13-Jul-1951 (70 y.o. M) Primary Care Shelbi Vaccaro: PA TIENT, NO Other Clinician: Referring Carmine Carrozza: Treating Jailon Schaible/Extender: [name]: 8 Visit Information History Since Last Visit Added or deleted any medications: No Patient Arrived: Walker Any new allergies or adverse reactions: No Arrival Time: 11:02 Had a fall or experienced change in No Accompanied By: self activities of daily living that may affect Transfer Assistance: None risk of falls: Patient Identification Verified: Yes Signs or symptoms of abuse/neglect since last visito No Secondary Verification Process Completed: Yes Hospitalized since last visit: No Patient Requires Transmission-Based Precautions: No Implantable device outside of the clinic excluding No Patient Has Alerts: Yes cellular tissue based products placed in the center Patient Alerts: Patient on Blood Thinner since last visit: R ABI=Non compressible Has Dressing in Place as Prescribed: Yes Pain Present Now: No Electronic Signature(s) Signed: 05/22/2021 3:50:24 PM By: [name] Entered By: [name] on 05/22/2021 11:05:44 -------------------------------------------------------------------------------- Encounter Discharge Information Details Patient Name: Date of Service: [name], Allen [name] 05/22/2021 10:45 A M Medical Record Number: [id number] Patient Account Number: [id number] Date of Birth/Sex: Treating RN: Oct 12, 1951 (70 y.o. [name] Primary Care Orazio Weller: PA [name], NO Other Clinician: Referring Maximus Hoffert: Treating Sarita [name]/Extender: [name]: 8 Encounter Discharge Information Items Post Procedure  Vitals Discharge Condition: Stable Temperature (F): 98.1 Ambulatory Status: Walker Pulse (bpm): 59 Discharge Destination: Home Respiratory Rate (breaths/min): 18 Transportation: Private Auto Blood Pressure (mmHg): 116/68 Accompanied By: self Schedule Follow-up Appointment: Yes Clinical Summary of Care: Electronic Signature(s) Signed: 05/22/2021 6:16:40 PM By: Deon Pilling Entered By: Deon Pilling on 05/22/2021 14:08:23 -------------------------------------------------------------------------------- Lower Extremity Assessment Details Patient Name: Date of Service: [name], Allen [name] 05/22/2021 10:45 A M Medical Record Number: [id number] Patient Account Number: [id number] Date of Birth/Sex: Treating RN: [birth year] (70 y.o. [name] Primary Care Laiah Pouncey: PA [name], NO Other Clinician: Referring Janessa Mickle: Treating Shamonica [name]/Extender: [name]: 8 Edema Assessment Assessed: [Left: No] [Right: Yes] Edema: [Left: Yes] [Right: Yes] Calf Left: Right: Point of Measurement: 39 cm From Medial Instep 49 cm Ankle Left: Right: Point of Measurement: 13 cm From Medial Instep 24 cm Vascular Assessment Pulses: Dorsalis Pedis Palpable: [Right:Yes] Electronic Signature(s) Signed: 05/22/2021 6:16:40 PM By: Deon Pilling Entered By: Deon Pilling on 05/22/2021 11:22:44 -------------------------------------------------------------------------------- Multi Wound Chart Details Patient Name: Date of Service: [name], Allen [name] 05/22/2021 10:45 A M Medical Record Number: [id number] Patient Account Number: [id number] Date of Birth/Sex: Treating RN: April 03, 1951 (70 y.o. M) Primary Care Amilliana Hayworth: PA [name], NO Other Clinician: Referring Christne Platts: Treating Chyrel Taha/Extender: [name]: 8 Vital Signs Height(in): Pulse(bpm): 59 Weight(lbs): Blood Pressure(mmHg): 116/68 Body Mass Index(BMI): Temperature(F):  98.1 Respiratory Rate(breaths/min): 18 Photos: [6:No Photos Right, Lateral Lower Leg] [N/A:N/A N/A] Wound Location: [6:Gradually Appeared] [N/A:N/A] Wounding Event: [6:Diabetic Wound/Ulcer of the Lower] [N/A:N/A] Primary Etiology: [6:Extremity Cataracts, Arrhythmia, Type II] [N/A:N/A] Comorbid History: [6:Diabetes, Osteoarthritis 05/22/2021] [N/A:N/A] Date Acquired: [6:0] [N/A:N/A] Weeks of Treatment: [6:Open] [N/A:N/A] Wound Status: [6:2x2.5x0.1] [N/A:N/A] Measurements L x W x D (cm) [6:3.927] [N/A:N/A] A (cm) : rea [6:0.393] [N/A:N/A] Volume (cm) : [6:0.00%] [N/A:N/A] % Reduction in A rea: [6:0.00%] [N/A:N/A] % Reduction in Volume: [6:Grade 2] [N/A:N/A] Classification: [6:Medium] [N/A:N/A]  Exudate A mount: [6:Serosanguineous] [N/A:N/A] Exudate Type: [6:red, brown] [N/A:N/A] Exudate Color: [6:Distinct, outline attached] [N/A:N/A] Wound Margin: [6:Large (67-100%)] [N/A:N/A] Granulation A mount: [6:Red] [N/A:N/A] Granulation Quality: [6:None Present (0%)] [N/A:N/A] Necrotic A mount: [6:Fat Layer (Subcutaneous Tissue): Yes N/A] Exposed Structures: [6:Fascia: No Tendon: No Muscle: No Joint: No Bone: No Small (1-33%)] [N/A:N/A] Epithelialization: [6:Debridement - Selective/Open Wound N/A] Debridement: Pre-procedure Verification/Time Out 11:28 [N/A:N/A] Taken: [6:Skin/Dermis] [N/A:N/A] Level: [6:5] [N/A:N/A] Debridement A (sq cm): [6:rea Other(Gauze)] [N/A:N/A] Instrument: [6:None] [N/A:N/A] Bleeding: [6:Procedure was tolerated well] [N/A:N/A] Debridement Treatment Response: [6:2x2.5x0.1] [N/A:N/A] Post Debridement Measurements L x W x D (cm) [6:0.393] [N/A:N/A] Post Debridement Volume: (cm) [6:Debridement] [N/A:N/A] Treatment Notes Electronic Signature(s) Signed: 05/22/2021 1:17:23 PM By: Kalman Shan DO Entered By: Kalman Shan on 05/22/2021 13:09:25 -------------------------------------------------------------------------------- Multi-Disciplinary Care Plan  Details Patient Name: Date of Service: [name], Allen [name] 05/22/2021 10:45 A M Medical Record Number: [id number] Patient Account Number: [id number] Date of Birth/Sex: Treating RN: Sep 19, 1951 (70 y.o. [name] Primary Care Margarethe Virgen: PA [name], NO Other Clinician: Referring Breella Vanostrand: Treating Shamekia Tippets/Extender: [name]: 8 Active Inactive Venous Leg Ulcer Nursing Diagnoses: Actual venous Insuffiency (use after diagnosis is confirmed) Goals: Patient will maintain optimal edema control Date Initiated: 05/22/2021 Target Resolution Date: 06/19/2021 Goal Status: Active Interventions: Assess peripheral edema status every visit. Compression as ordered Provide education on venous insufficiency Treatment Activities: Therapeutic compression applied : 05/22/2021 Notes: Wound/Skin Impairment Nursing Diagnoses: Impaired tissue integrity Knowledge deficit related to ulceration/compromised skin integrity Goals: Patient/caregiver will verbalize understanding of skin care regimen Date Initiated: 03/26/2021 Target Resolution Date: 06/19/2021 Goal Status: Active Ulcer/skin breakdown will have a volume reduction of 30% by week 4 Date Initiated: 03/26/2021 Target Resolution Date: 06/19/2021 Goal Status: Active Interventions: Assess patient/caregiver ability to obtain necessary supplies Assess patient/caregiver ability to perform ulcer/skin care regimen upon admission and as needed Assess ulceration(s) every visit Provide education on ulcer and skin care Notes: Electronic Signature(s) Signed: 05/22/2021 11:51:06 AM By: Lorrin Jackson Entered By: Lorrin Jackson on 05/22/2021 11:51:05 -------------------------------------------------------------------------------- Pain Assessment Details Patient Name: Date of Service: [name], Allen [name] 05/22/2021 10:45 A M Medical Record Number: [id number] Patient Account Number: [id number] Date of  Birth/Sex: Treating RN: [birth year] (70 y.o. M) Primary Care Alizah Sills: PA [name], NO Other Clinician: Referring Shakti Fleer: Treating Momodou Consiglio/Extender: [name]: 8 Active Problems Location of Pain Severity and Description of Pain Patient Has Paino No Site Locations Pain Management and Medication Current Pain Management: Electronic Signature(s) Signed: 05/22/2021 3:50:24 PM By: [name] Entered By: [name] on 05/22/2021 11:07:19 -------------------------------------------------------------------------------- Patient/Caregiver Education Details Patient Name: Date of Service: [name], Allen [name] 6/10/2022andnbsp10:45 A M Medical Record Number: [id number] Patient Account Number: [id number] Date of Birth/Gender: Treating RN: 1951-03-11 (70 y.o. [name] Primary Care Physician: PA [name], NO Other Clinician: Referring Physician: Treating Physician/Extender: [name] in Treatment: 8 Education Assessment Education Provided To: Patient Education Topics Provided Venous: Methods: Demonstration, Explain/Verbal, Printed Responses: State content correctly Wound/Skin Impairment: Methods: Demonstration, Explain/Verbal, Printed Responses: State content correctly Electronic Signature(s) Signed: 05/22/2021 5:10:19 PM By: Lorrin Jackson Entered By: Lorrin Jackson on 05/22/2021 11:51:24 -------------------------------------------------------------------------------- Wound Assessment Details Patient Name: Date of Service: [name], Allen [name] 05/22/2021 10:45 A M Medical Record Number: [id number] Patient Account Number: [id number] Date of Birth/Sex: Treating RN: December 20, 1950 (70 y.o. M) Primary Care Dorette Hartel: PA [name], NO Other Clinician: Referring Motty Borin: Treating Madesyn Ast/Extender: Duayne Cal, Laurey Morale  in Treatment: 8 Wound Status Wound Number: 6 Primary Etiology: Diabetic Wound/Ulcer  of the Lower Extremity Wound Location: Right, Lateral Lower Leg Wound Status: Open Wounding Event: Gradually Appeared Comorbid History: Cataracts, Arrhythmia, Type II Diabetes, Osteoarthritis Date Acquired: 05/22/2021 Weeks Of Treatment: 0 Clustered Wound: No Photos Wound Measurements Length: (cm) 2 Width: (cm) 2.5 Depth: (cm) 0.1 Area: (cm) 3.927 Volume: (cm) 0.393 % Reduction in Area: 0% % Reduction in Volume: 0% Epithelialization: Small (1-33%) Tunneling: No Undermining: No Wound Description Classification: Grade 2 Wound Margin: Distinct, outline attached Exudate Amount: Medium Exudate Type: Serosanguineous Exudate Color: red, brown Foul Odor After Cleansing: No Slough/Fibrino No Wound Bed Granulation Amount: Large (67-100%) Exposed Structure Granulation Quality: Red Fascia Exposed: No Necrotic Amount: None Present (0%) Fat Layer (Subcutaneous Tissue) Exposed: Yes Tendon Exposed: No Muscle Exposed: No Joint Exposed: No Bone Exposed: No Treatment Notes Wound #6 (Lower Leg) Wound Laterality: Right, Lateral Cleanser Normal Saline Discharge Instruction: Cleanse the wound with Normal Saline prior to applying a clean dressing using gauze sponges, not tissue or cotton balls. Soap and Water Discharge Instruction: May shower and wash wound with dial antibacterial soap and water prior to dressing change. Peri-Wound Care Triamcinolone 15 (g) Discharge Instruction: Use triamcinolone 15 (g) as directed Sween Lotion (Moisturizing lotion) Discharge Instruction: Apply moisturizing lotion as directed Topical Primary Dressing KerraCel Ag Gelling Fiber Dressing, 4x5 in (silver alginate) Discharge Instruction: Apply silver alginate to wound bed as instructed Secondary Dressing Woven Gauze Sponge, Non-Sterile 4x4 in Discharge Instruction: Apply over primary dressing as directed. ABD Pad, 8x10 Discharge Instruction: Apply over primary dressing as directed. Secured  With Compression Wrap Kerlix Roll 4.5x3.1 (in/yd) Discharge Instruction: Apply Kerlix and Coban compression as directed. Coban Self-Adherent Wrap 4x5 (in/yd) Discharge Instruction: Apply over Kerlix as directed. Compression Stockings Add-Ons Electronic Signature(s) Signed: 05/22/2021 4:58:44 PM By: [name] Previous Signature: 05/22/2021 11:55:58 AM Version By: Lorrin Jackson Entered By: [name] on 05/22/2021 16:43:51 -------------------------------------------------------------------------------- Vitals Details Patient Name: Date of Service: [name], Allen [name] 05/22/2021 10:45 A M Medical Record Number: [id number] Patient Account Number: [id number] Date of Birth/Sex: Treating RN: Feb 28, 1951 (70 y.o. M) Primary Care Akshaj Besancon: PA TIENT, NO Other Clinician: Referring Via Rosado: Treating Dorreen Valiente/Extender: [name]: 8 Vital Signs Time Taken: 11:06 Temperature (F): 98.1 Pulse (bpm): 59 Respiratory Rate (breaths/min): 18 Blood Pressure (mmHg): 116/68 Reference Range: 80 - 120 mg / dl Electronic Signature(s) Signed: 05/22/2021 3:50:24 PM By: [name] Entered By: [name] on 05/22/2021 11:07:10

## 2021-05-22 NOTE — Progress Notes (Signed)
JAWAUN, CELMER ([id number]) Visit Report for 05/22/2021 Chief Complaint Document Details Patient Name: Date of Service: [name] 05/22/2021 10:45 A M Medical Record Number: [id number] Patient Account Number: [id number] Date of Birth/Sex: Treating RN: [birth year] (70 y.o. M) Primary Care Provider: PA [name], NO Other Clinician: Referring Provider: Treating Provider/Extender: [name]: 8 Information Obtained from: Patient Chief Complaint bilateral lower extremity swelling with wounds Electronic Signature(s) Signed: 05/22/2021 1:17:23 PM By: Kalman Shan DO Entered By: Kalman Shan on 05/22/2021 13:09:45 -------------------------------------------------------------------------------- Debridement Details Patient Name: Date of Service: [name], Allen [name] 05/22/2021 10:45 A M Medical Record Number: [id number] Patient Account Number: [id number] Date of Birth/Sex: Treating RN: 06/04/1951 (70 y.o. [name] Primary Care Provider: PA [name], NO Other Clinician: Referring Provider: Treating Provider/Extender: [name]: 8 Debridement Performed for Assessment: Wound #6 Left,Lateral Lower Leg Performed By: Physician Kalman Shan, DO Debridement Type: Debridement Severity of Tissue Pre Debridement: Fat layer exposed Level of Consciousness (Pre-procedure): Awake and Alert Pre-procedure Verification/Time Out Yes - 11:28 Taken: Start Time: 11:29 T Area Debrided (L x W): otal 2 (cm) x 2.5 (cm) = 5 (cm) Tissue and other material debrided: Non-Viable, Skin: Dermis Level: Skin/Dermis Debridement Description: Selective/Open Wound Instrument: Other : Gauze Bleeding: None End Time: 11:32 Response to Treatment: Procedure was tolerated well Level of Consciousness (Post- Awake and Alert procedure): Post Debridement Measurements of Total Wound Length: (cm) 2 Width: (cm) 2.5 Depth: (cm)  0.1 Volume: (cm) 0.393 Character of Wound/Ulcer Post Debridement: Stable Severity of Tissue Post Debridement: Fat layer exposed Post Procedure Diagnosis Same as Pre-procedure Electronic Signature(s) Signed: 05/22/2021 1:17:23 PM By: Kalman Shan DO Signed: 05/22/2021 5:10:19 PM By: Lorrin Jackson Entered By: Lorrin Jackson on 05/22/2021 11:32:27 -------------------------------------------------------------------------------- HPI Details Patient Name: Date of Service: [name], Allen [name] 05/22/2021 10:45 A M Medical Record Number: [id number] Patient Account Number: [id number] Date of Birth/Sex: Treating RN: 1951/09/15 (70 y.o. M) Primary Care Provider: PA [name], NO Other Clinician: Referring Provider: Treating Provider/Extender: [name]: 8 History of Present Illness HPI Description: [name] is a 70 year old male with a past medical history of type 2 diabetes and essential hypertension who presents to the clinic today for bilateral lower extremity wounds. He was recently seen in the ED on 3/9 for bilateral lower extremity edema. He had a DVT study that was negative for clots. He was given triamcinolone cream and advised to elevate legs and obtain compression stockings. He has been using the triamcinolone cream however he is unable to use compression stockings due to difficulty of putting on. Patient states that for the past 2 years he has had opening and closing of weeping wounds to his legs bilaterally. They spontaneously open and close. They are often open for several weeks before they heal spontaneously. He denies any purulent drainage, increased warmth or increased erythema to the skin. 4/21; patient presents for 1 week follow-up. The wounds on the right leg have healed however he has now 2 new wounds 1 on the anterior and posterior thigh that occurred from him scratching underneath the wrap. The left leg wound has improved and almost  closed 4/28; patient presents for 1 week follow-up. He continues to have a wound on the right anterior leg that is showing signs of healing. The posterior right thigh has closed. He now has a new wound to the left leg that is limited to skin  breakdown. 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 The wounds are occurring because he goes underneath the leg wraps and scratches creating new wounds. He has no complaints today. 5/5; patient presents for 1 week follow-up. He no longer has any open wounds to his lower extremities bilaterally. He continues to have scattered excoriation marks from where he scratches. These are scabbed over. He states he is going to see dermatology this month. Overall he is doing well and is happy with his care. Readmission 6/10 Patient was followed for open wounds limited to skin breakdown to his lower extremities bilaterally 2/2 venous insufficiency. Once the wounds healed he was discharged with juxta lite compressions and these were placed in office. He was discharged on 5/5. He states that he has not taken the wraps off since discharge and has had them on for over a month. He did not want to take them off because they were very comfortable. He does not have any difficulty putting these on and off if he wanted to. He states that he does not take a shower and just cleans the other parts of his body at the sink. He noticed that 2 days ago he was having some drainage at the top of the wrap and called in to be evaluated. He states that he has not seen the wounds until the wraps were taken off today in office. He denies pain or increased warmth or erythema to the area Electronic Signature(s) Signed: 05/22/2021 1:17:23 PM By: Kalman Shan DO Entered By: Kalman Shan on 05/22/2021 13:13:12 -------------------------------------------------------------------------------- Physical Exam Details Patient  Name: Date of Service: [name], Allen [name] 05/22/2021 10:45 A M Medical Record Number: [id number] Patient Account Number: [id number] Date of Birth/Sex: Treating RN: [birth year] (70 y.o. M) Primary Care Provider: PA [name], NO Other Clinician: Referring Provider: Treating Provider/Extender: [name]: 8 Constitutional respirations regular, non-labored and within target range for patient.. Cardiovascular 2+ dorsalis pedis/posterior tibialis pulses. Psychiatric pleasant and cooperative. Notes Right lower extremity with open wounds limited to skin breakdown. There is increased irritation surrounding these wounds. No obvious signs of infection. No pain to palpation. 2+ pitting edema to the right leg to the knee. Venous stasis dermatitis bilaterally Electronic Signature(s) Signed: 05/22/2021 1:17:23 PM By: Kalman Shan DO Entered By: Kalman Shan on 05/22/2021 13:14:00 -------------------------------------------------------------------------------- Physician Orders Details Patient Name: Date of Service: [name], Allen [name] 05/22/2021 10:45 A M Medical Record Number: [id number] Patient Account Number: [id number] Date of Birth/Sex: Treating RN: 05/24/1951 (70 y.o. [name] Primary Care Provider: PA [name], NO Other Clinician: Referring Provider: Treating Provider/Extender: [name]: 8 Verbal / Phone Orders: No Diagnosis Coding ICD-10 Coding Code Description L97.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 with unspecified severity L97.829 Non-pressure chronic ulcer of other part of left lower leg with unspecified severity I89.0 Lymphedema, not elsewhere classified I87.2 Venous insufficiency (chronic) (peripheral) Follow-up Appointments ppointment in 2 weeks. - with Dr. Heber [place] Return A Nurse Visit: - Dressing change: Tues or Wed Bathing/ Shower/ Hygiene May shower with  protection but do not get wound dressing(s) wet. - You can use cast protectors...they are available at [facility], etc. Edema Control - Lymphedema / SCD / Other Elevate legs to the level of the heart or above for 30 minutes daily and/or when sitting, a frequency of: - throughout the day 3-4 times a  day. Avoid standing for long periods of time. Exercise regularly Other Edema Control Orders/Instructions: - patient to bring in juxtalites HD weekly to wound center. Additional Orders / Instructions Other: - ****Patient to ensure to follow up with [facility] when their office call to schedule Arterial Studies.**** Wound Treatment Wound #6 - Lower Leg Wound Laterality: Right, Lateral Cleanser: Normal Saline 1 x Per Week/30 Days Discharge Instructions: Cleanse the wound with Normal Saline prior to applying a clean dressing using gauze sponges, not tissue or cotton balls. Cleanser: Soap and Water 1 x Per Week/30 Days Discharge Instructions: May shower and wash wound with dial antibacterial soap and water prior to dressing change. Peri-Wound Care: Triamcinolone 15 (g) 1 x Per Week/30 Days Discharge Instructions: Use triamcinolone 15 (g) as directed Peri-Wound Care: Sween Lotion (Moisturizing lotion) 1 x Per Week/30 Days Discharge Instructions: Apply moisturizing lotion as directed Prim Dressing: KerraCel Ag Gelling Fiber Dressing, 4x5 in (silver alginate) 1 x Per Week/30 Days ary Discharge Instructions: Apply silver alginate to wound bed as instructed Secondary Dressing: Woven Gauze Sponge, Non-Sterile 4x4 in 1 x Per Week/30 Days Discharge Instructions: Apply over primary dressing as directed. Secondary Dressing: ABD Pad, 8x10 1 x Per Week/30 Days Discharge Instructions: Apply over primary dressing as directed. Compression Wrap: Kerlix Roll 4.5x3.1 (in/yd) 1 x Per Week/30 Days Discharge Instructions: Apply Kerlix and Coban compression as directed. Compression Wrap: Coban  Self-Adherent Wrap 4x5 (in/yd) 1 x Per Week/30 Days Discharge Instructions: Apply over Kerlix as directed. Services and Therapies nkle Brachial Index (ABI)/TBI: Bilateral Legs - Chronic leg wounds, venous insufficiency - (ICD10 L97.819 - Non-pressure chronic ulcer of other A part of right lower leg with unspecified severity) Notes Resides at [facility] in [place]: [phone] Staff that schedules appts: Richardson Landry and Izora Ribas Electronic Signature(s) Signed: 05/22/2021 1:17:23 PM By: Kalman Shan DO Entered By: Kalman Shan on 05/22/2021 13:14:14 -------------------------------------------------------------------------------- Problem List Details Patient Name: Date of Service: [name], Allen [name] 05/22/2021 10:45 A M Medical Record Number: [id number] Patient Account Number: [id number] Date of Birth/Sex: Treating RN: 02-Sep-1951 (70 y.o. 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 with unspecified 03/26/2021 No Yes severity L97.829 Non-pressure chronic ulcer of other part of left lower leg with unspecified 03/26/2021 No Yes severity I89.0 Lymphedema, not elsewhere classified 03/26/2021 No Yes I87.2 Venous insufficiency (chronic) (peripheral) 03/26/2021 No Yes Inactive Problems Resolved Problems Electronic Signature(s) Signed: 05/22/2021 1:17:23 PM By: Kalman Shan DO Entered By: Kalman Shan on 05/22/2021 13:09:19 -------------------------------------------------------------------------------- Progress Note Details Patient Name: Date of Service: [name], Allen [name] 05/22/2021 10:45 A M Medical Record Number: [id number] Patient Account Number: [id number] Date of Birth/Sex: Treating  RN: 1951-08-09 (70 y.o. M) Primary Care Provider: PA [name], NO Other Clinician: Referring Provider: Treating Provider/Extender: [name]: 8 Subjective Chief Complaint Information obtained from Patient bilateral lower extremity swelling with wounds History of Present Illness (HPI) [name] is a 70 year old male with a past medical history of type 2 diabetes and essential hypertension who presents to the clinic today for bilateral lower extremity wounds. He was recently seen in the ED on 3/9 for bilateral lower extremity edema. He had a DVT study that was negative for clots. He was given triamcinolone cream and advised to  elevate legs and obtain compression stockings. He has been using the triamcinolone cream however he is unable to use compression stockings due to difficulty of putting on. Patient states that for the past 2 years he has had opening and closing of weeping wounds to his legs bilaterally. They spontaneously open and close. They are often open for several weeks before they heal spontaneously. He denies any purulent drainage, increased warmth or increased erythema to the skin. 4/21; patient presents for 1 week follow-up. The wounds on the right leg have healed however he has now 2 new wounds 1 on the anterior and posterior thigh that occurred from him scratching underneath the wrap. The left leg wound has improved and almost closed 4/28; patient presents for 1 week follow-up. He continues to have a wound on the right anterior leg that is showing signs of healing. The posterior right thigh has closed. He now has a new wound to the left leg that is limited to skin breakdown. 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 The wounds are occurring because he goes underneath the leg wraps and scratches creating new wounds. He has no complaints today. 5/5; patient presents for 1  week follow-up. He no longer has any open wounds to his lower extremities bilaterally. He continues to have scattered excoriation marks from where he scratches. These are scabbed over. He states he is going to see dermatology this month. Overall he is doing well and is happy with his care. Readmission 6/10 Patient was followed for open wounds limited to skin breakdown to his lower extremities bilaterally 2/2 venous insufficiency. Once the wounds healed he was discharged with juxta lite compressions and these were placed in office. He was discharged on 5/5. He states that he has not taken the wraps off since discharge and has had them on for over a month. He did not want to take them off because they were very comfortable. He does not have any difficulty putting these on and off if he wanted to. He states that he does not take a shower and just cleans the other parts of his body at the sink. He noticed that 2 days ago he was having some drainage at the top of the wrap and called in to be evaluated. He states that he has not seen the wounds until the wraps were taken off today in office. He denies pain or increased warmth or erythema to the area Patient History Unable to Obtain Patient History due to Altered Mental Status. Information obtained from Patient. Family History Cancer - Mother,Maternal Grandparents, Heart Disease - Maternal Grandparents, Hypertension - Maternal Grandparents, No family history of Diabetes, Hereditary Spherocytosis. Social History Former smoker - ended on 09/14/1983, Alcohol Use - Rarely, Drug Use - No History, Caffeine Use - Never. Medical History Eyes Patient has history of Cataracts - 2014 Cardiovascular Patient has history of Arrhythmia - A-Fib Endocrine Patient has history of Type II Diabetes Musculoskeletal Patient has history of Osteoarthritis Medical A Surgical History Notes nd Hematologic/Lymphatic Hyperbilirubinemia Gastrointestinal Rectal  Pain-Constipation Genitourinary CKD stage 3-BPH Oncologic Colon Cancer-Colon Re-section Objective Constitutional respirations regular, non-labored and within target range for patient.. Vitals Time Taken: 11:06 AM, Temperature: 98.1 F, Pulse: 59 bpm, Respiratory Rate: 18 breaths/min, Blood Pressure: 116/68 mmHg. Cardiovascular 2+ dorsalis pedis/posterior tibialis pulses. Psychiatric pleasant and cooperative. General Notes: Right lower extremity with open wounds limited to skin breakdown. There is increased irritation surrounding these wounds. No  obvious signs of infection. No pain to palpation. 2+ pitting edema to the right leg to the knee. Venous stasis dermatitis bilaterally Integumentary (Hair, Skin) Wound #6 status is Open. Original cause of wound was Gradually Appeared. The date acquired was: 05/22/2021. The wound is located on the Right,Lateral Lower Leg. The wound measures 2cm length x 2.5cm width x 0.1cm depth; 3.927cm^2 area and 0.393cm^3 volume. There is Fat Layer (Subcutaneous Tissue) exposed. There is no tunneling or undermining noted. There is a medium amount of serosanguineous drainage noted. The wound margin is distinct with the outline attached to the wound base. There is large (67-100%) red granulation within the wound bed. There is no necrotic tissue within the wound bed. 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 He seemed hesitant to do this but is agreeable. The right leg has some open wounds limited to skin breakdown. I do not think these are infected and he would benefit from silver alginate with Kerlix/Coban. ABIs are  noncompressible. I will obtain formal ABIs. Procedures Wound #6 Pre-procedure diagnosis of Wound #6 is a Diabetic Wound/Ulcer of the Lower Extremity located on the Left,Lateral Lower Leg .Severity of Tissue Pre Debridement is: Fat layer exposed. There was a Selective/Open Wound Skin/Dermis Debridement with a total area of 5 sq cm performed by Kalman Shan, DO. With the following instrument(s): Gauze to remove Non-Viable tissue/material. Material removed includes Skin: Dermis. No specimens were taken. A time out was conducted at 11:28, prior to the start of the procedure. There was no bleeding. The procedure was tolerated well. Post Debridement Measurements: 2cm length x 2.5cm width x 0.1cm depth; 0.393cm^3 volume. Character of Wound/Ulcer Post Debridement is stable. Severity of Tissue Post Debridement is: Fat layer exposed. Post procedure Diagnosis Wound #6: Same as Pre-Procedure Plan Follow-up Appointments: Return Appointment in 2 weeks. - with Dr. Heber [place] Nurse Visit: - Dressing change: Tues or Wed Bathing/ Shower/ Hygiene: May shower with protection but do not get wound dressing(s) wet. - You can use cast protectors...they are available at [facility], etc. Edema Control - Lymphedema / SCD / Other: Elevate legs to the level of the heart or above for 30 minutes daily and/or when sitting, a frequency of: - throughout the day 3-4 times a day. Avoid standing for long periods of time. Exercise regularly Other Edema Control Orders/Instructions: - patient to bring in juxtalites HD weekly to wound center. Additional Orders / Instructions: Other: - ****Patient to ensure to follow up with [facility] when their office call to schedule Arterial Studies.**** Services and Therapies ordered were: Ankle Brachial Index (ABI)/TBI: Bilateral Legs - Chronic leg wounds, venous insufficiency General Notes: Resides at [facility] in [place]: [phone] Staff that schedules appts:  Richardson Landry and Izora Ribas WOUND #6: - Lower Leg Wound Laterality: Right, Lateral Cleanser: Normal Saline 1 x Per Week/30 Days Discharge Instructions: Cleanse the wound with Normal Saline prior to applying a clean dressing using gauze sponges, not tissue or cotton balls. Cleanser: Soap and Water 1 x Per Week/30 Days Discharge Instructions: May shower and wash wound with dial antibacterial soap and water prior to dressing change. Peri-Wound Care: Triamcinolone 15 (g) 1 x Per Week/30 Days Discharge Instructions: Use triamcinolone 15 (  g) as directed Peri-Wound Care: Sween Lotion (Moisturizing lotion) 1 x Per Week/30 Days Discharge Instructions: Apply moisturizing lotion as directed Prim Dressing: KerraCel Ag Gelling Fiber Dressing, 4x5 in (silver alginate) 1 x Per Week/30 Days ary Discharge Instructions: Apply silver alginate to wound bed as instructed Secondary Dressing: Woven Gauze Sponge, Non-Sterile 4x4 in 1 x Per Week/30 Days Discharge Instructions: Apply over primary dressing as directed. Secondary Dressing: ABD Pad, 8x10 1 x Per Week/30 Days Discharge Instructions: Apply over primary dressing as directed. Com pression Wrap: Kerlix Roll 4.5x3.1 (in/yd) 1 x Per Week/30 Days Discharge Instructions: Apply Kerlix and Coban compression as directed. Com pression Wrap: Coban Self-Adherent Wrap 4x5 (in/yd) 1 x Per Week/30 Days Discharge Instructions: Apply over Kerlix as directed. 1. Silver alginate with Kerlix/Coban 2. Follow-up next week 3. Juxta lite on the left leg dailyooremove at night and replace in the morning Electronic Signature(s) Signed: 05/22/2021 1:17:23 PM By: Kalman Shan DO Entered By: Kalman Shan on 05/22/2021 13:16:16 -------------------------------------------------------------------------------- HxROS Details Patient Name: Date of Service: [name], Allen [name] 05/22/2021 10:45 A M Medical Record Number: [id number] Patient Account Number:  [id number] Date of Birth/Sex: Treating RN: 04/30/1951 (70 y.o. M) Primary Care Provider: PA [name], NO Other Clinician: Referring Provider: Treating Provider/Extender: [name]: 8 Unable to Obtain Patient History due to Altered Mental Status Information Obtained From Patient Eyes Medical History: Positive for: Cataracts - 2014 Hematologic/Lymphatic Medical History: Past Medical History Notes: Hyperbilirubinemia Cardiovascular Medical History: Positive for: Arrhythmia - A-Fib Gastrointestinal Medical History: Past Medical History Notes: Rectal Pain-Constipation Endocrine Medical History: Positive for: Type II Diabetes Time with diabetes: Since 2014 Treated with: Oral agents Blood sugar tested every day: Yes Tested : Genitourinary Medical History: Past Medical History Notes: CKD stage 3-BPH Musculoskeletal Medical History: Positive for: Osteoarthritis Oncologic Medical History: Past Medical History Notes: Colon Cancer-Colon Re-section HBO Extended History Items Eyes: Cataracts Immunizations Pneumococcal Vaccine: Received Pneumococcal Vaccination: No Implantable Devices None Family and Social History Cancer: Yes - Mother,Maternal Grandparents; Diabetes: No; Heart Disease: Yes - Maternal Grandparents; Hereditary Spherocytosis: No; Hypertension: Yes - Maternal Grandparents; Former smoker - ended on 09/14/1983; Alcohol Use: Rarely; Drug Use: No History; Caffeine Use: Never; Financial Concerns: No; Food, Clothing or Shelter Needs: No; Support System Lacking: No; Transportation Concerns: No Electronic Signature(s) Signed: 05/22/2021 1:17:23 PM By: Kalman Shan DO Entered By: Kalman Shan on 05/22/2021 13:13:18 -------------------------------------------------------------------------------- SuperBill Details Patient Name: Date of Service: [name], Allen [name] 05/22/2021 Medical Record Number: [id number] Patient Account  Number: [id number] Date of Birth/Sex: Treating RN: Sep 12, 1951 (70 y.o. [name] Primary Care Provider: PA [name], NO Other Clinician: Referring Provider: Treating Provider/Extender: [name]: 8 Diagnosis Coding ICD-10 Codes Code Description L97.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 with unspecified severity L97.829 Non-pressure chronic ulcer of other part of left lower leg with unspecified severity I89.0 Lymphedema, not elsewhere classified I87.2 Venous insufficiency (chronic) (peripheral) Facility Procedures CPT4 Code: [id number] Description: [phone] - DEBRIDE WOUND 1ST 20 SQ CM OR < ICD-10 Diagnosis Description L97.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 with unspecified seve Modifier: rity Quantity: 1 Physician Procedures : CPT4 Code Description Modifier [id number] - WC PHYS LEVEL 3 - EST PT ICD-10 Diagnosis Description L97.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 with unspecified severity I87.2 Venous insufficiency (chronic) (peripheral) I89.0  Lymphedema, not elsewhere classified Quantity: 1 : [id number] -  WC PHYS DEBR WO ANESTH 20 SQ CM ICD-10 Diagnosis Description L97.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 with unspecified severity Quantity: 1 Electronic Signature(s) Signed: 05/22/2021 1:17:23 PM By: Kalman Shan DO Previous Signature: 05/22/2021 11:51:58 AM Version By: Lorrin Jackson Entered By: Kalman Shan on 05/22/2021 13:16:39

## 2021-05-27 ENCOUNTER — Other Ambulatory Visit: Payer: Self-pay

## 2021-05-27 ENCOUNTER — Encounter (HOSPITAL_BASED_OUTPATIENT_CLINIC_OR_DEPARTMENT_OTHER): Payer: Medicare Other | Admitting: Physician Assistant

## 2021-05-27 DIAGNOSIS — E11622 Type 2 diabetes mellitus with other skin ulcer: Secondary | ICD-10-CM | POA: Diagnosis not present

## 2021-05-27 NOTE — Progress Notes (Signed)
ANTAVION, BARTOSZEK ([id number]) Visit Report for 05/27/2021 Arrival Information Details Patient Name: Date of Service: [name] 05/27/2021 11:15 A M Medical Record Number: [id number] Patient Account Number: [id number] Date of Birth/Sex: Treating RN: 04/14/1951 (70 y.o. [name] Primary Care Alley Neils: PA [name], NO Other Clinician: Referring Terren Haberle: Treating Pankaj Haack/Extender: Chriss Czar Weeks in Treatment: 8 Visit Information History Since Last Visit Added or deleted any medications: No Patient Arrived: Ambulatory Any new allergies or adverse reactions: No Arrival Time: 11:19 Had a fall or experienced change in No Accompanied By: self activities of daily living that may affect Transfer Assistance: None risk of falls: Patient Identification Verified: Yes Signs or symptoms of abuse/neglect since last visito No Secondary Verification Process Completed: Yes Hospitalized since last visit: No Patient Requires Transmission-Based No Implantable device outside of the clinic excluding No Precautions: cellular tissue based products placed in the center Patient Has Alerts: Yes since last visit: Patient Alerts: Patient on Blood Thinner Has Dressing in Place as Prescribed: Yes R ABI=Non compressible Pain Present Now: No Electronic Signature(s) Signed: 05/27/2021 1:43:18 PM By: [name] Entered By: [name] on 05/27/2021 11:20:07 -------------------------------------------------------------------------------- Clinic Level of Care Assessment Details Patient Name: Date of Service: [name] 05/27/2021 11:15 A M Medical Record Number: [id number] Patient Account Number: [id number] Date of Birth/Sex: Treating RN: 07/30/1951 (70 y.o. [name] Primary Care Carrina Schoenberger: PA [name], NO Other Clinician: Referring Taeshaun Rames: Treating Kilian Schwartz/Extender: Chriss Czar Weeks in Treatment: 8 Clinic Level of Care Assessment  Items TOOL 4 Quantity Score X- 1 0 Use when only an EandM is performed on FOLLOW-UP visit ASSESSMENTS - Nursing Assessment / Reassessment X- 1 10 Reassessment of Co-morbidities (includes updates in patient status) X- 1 5 Reassessment of Adherence to Treatment Plan ASSESSMENTS - Wound and Skin A ssessment / Reassessment X - Simple Wound Assessment / Reassessment - one wound 1 5 []  - 0 Complex Wound Assessment / Reassessment - multiple wounds []  - 0 Dermatologic / Skin Assessment (not related to wound area) ASSESSMENTS - Focused Assessment []  - 0 Circumferential Edema Measurements - multi extremities []  - 0 Nutritional Assessment / Counseling / Intervention []  - 0 Lower Extremity Assessment (monofilament, tuning fork, pulses) []  - 0 Peripheral Arterial Disease Assessment (using hand held doppler) ASSESSMENTS - Ostomy and/or Continence Assessment and Care []  - 0 Incontinence Assessment and Management []  - 0 Ostomy Care Assessment and Management (repouching, etc.) PROCESS - Coordination of Care []  - 0 Simple Patient / Family Education for ongoing care X- 1 20 Complex (extensive) Patient / Family Education for ongoing care []  - 0 Staff obtains Programmer, systems, Records, T Results / Process Orders est X- 1 10 Staff telephones HHA, Nursing Homes / Clarify orders / etc []  - 0 Routine Transfer to another Facility (non-emergent condition) []  - 0 Routine Hospital Admission (non-emergent condition) []  - 0 New Admissions / Biomedical engineer / Ordering NPWT Apligraf, etc. , []  - 0 Emergency Hospital Admission (emergent condition) []  - 0 Simple Discharge Coordination []  - 0 Complex (extensive) Discharge Coordination PROCESS - Special Needs []  - 0 Pediatric / Minor Patient Management []  - 0 Isolation Patient Management []  - 0 Hearing / Language / Visual special needs []  - 0 Assessment of Community assistance (transportation, D/C planning, etc.) []  - 0 Additional  assistance / Altered mentation []  - 0 Support Surface(s) Assessment (bed, cushion, seat, etc.) INTERVENTIONS - Wound Cleansing / Measurement X - Simple  Wound Cleansing - one wound 1 5 []  - 0 Complex Wound Cleansing - multiple wounds []  - 0 Wound Imaging (photographs - any number of wounds) []  - 0 Wound Tracing (instead of photographs) X- 1 5 Simple Wound Measurement - one wound []  - 0 Complex Wound Measurement - multiple wounds INTERVENTIONS - Wound Dressings []  - 0 Small Wound Dressing one or multiple wounds []  - 0 Medium Wound Dressing one or multiple wounds X- 1 20 Large Wound Dressing one or multiple wounds []  - 0 Application of Medications - topical []  - 0 Application of Medications - injection INTERVENTIONS - Miscellaneous []  - 0 External ear exam []  - 0 Specimen Collection (cultures, biopsies, blood, body fluids, etc.) []  - 0 Specimen(s) / Culture(s) sent or taken to Lab for analysis []  - 0 Patient Transfer (multiple staff / Civil Service fast streamer / Similar devices) []  - 0 Simple Staple / Suture removal (25 or less) []  - 0 Complex Staple / Suture removal (26 or more) []  - 0 Hypo / Hyperglycemic Management (close monitor of Blood Glucose) []  - 0 Ankle / Brachial Index (ABI) - do not check if billed separately X- 1 5 Vital Signs Has the patient been seen at the hospital within the last three years: Yes Total Score: 85 Level Of Care: New/Established - Level 3 Electronic Signature(s) Signed: 05/27/2021 2:58:24 PM By: Lorrin Jackson Entered By: Lorrin Jackson on 05/27/2021 11:30:19 -------------------------------------------------------------------------------- Encounter Discharge Information Details Patient Name: Date of Service: [name], [name] 05/27/2021 11:15 A M Medical Record Number: [id number] Patient Account Number: [id number] Date of Birth/Sex: Treating RN: 02-12-1951 (70 y.o. [name] Primary Care Demari Gales: PA [name], NO Other Clinician: Referring  Aasiya Creasey: Treating Richel Millspaugh/Extender: Matt Holmes in Treatment: 8 Encounter Discharge Information Items Discharge Condition: Stable Ambulatory Status: Walker Discharge Destination: [place] Orders Sent: Yes Transportation: Private Auto Schedule Follow-up Appointment: Yes Clinical Summary of Care: Provided on 05/27/2021 Form Type Recipient Paper Patient Patient Electronic Signature(s) Signed: 05/27/2021 2:58:24 PM By: Lorrin Jackson Entered By: Lorrin Jackson on 05/27/2021 11:28:42 -------------------------------------------------------------------------------- Patient/Caregiver Education Details Patient Name: Date of Service: [name], [name] 6/15/2022andnbsp11:15 A M Medical Record Number: [id number] Patient Account Number: [id number] Date of Birth/Gender: Treating RN: 04-Jul-1951 (70 y.o. [name] Primary Care Physician: PA [name], NO Other Clinician: Referring Physician: Treating Physician/Extender: Matt Holmes in Treatment: 8 Education Assessment Education Provided To: Patient Education Topics Provided Venous: Methods: Explain/Verbal Responses: State content correctly Wound/Skin Impairment: Methods: Explain/Verbal Electronic Signature(s) Signed: 05/27/2021 2:58:24 PM By: Lorrin Jackson Entered By: Lorrin Jackson on 05/27/2021 11:28:00 -------------------------------------------------------------------------------- Wound Assessment Details Patient Name: Date of Service: [name], [name] 05/27/2021 11:15 A M Medical Record Number: [id number] Patient Account Number: [id number] Date of Birth/Sex: Treating RN: March 20, 1951 (70 y.o. 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  Location: Right, Lateral Lower Leg Wound Status: Open Wounding Event: Gradually Appeared Comorbid History: Cataracts, Arrhythmia, Type II Diabetes, Osteoarthritis Date Acquired: 05/22/2021 Weeks Of Treatment: 0 Clustered Wound: No Wound Measurements Length: (cm) 2 Width: (cm) 2.5 Depth: (cm) 0.1 Area: (cm) 3.927 Volume: (cm) 0.393 % Reduction in Area: 0% % Reduction in Volume: 0% Epithelialization: Small (1-33%) Tunneling: No Undermining: No Wound Description Classification: Grade 2 Wound Margin: Distinct, outline attached  Exudate Amount: Large Exudate Type: Serosanguineous Exudate Color: red, brown Foul Odor After Cleansing: No Slough/Fibrino No Wound Bed Granulation Amount: Large (67-100%) Exposed Structure Granulation Quality: Red Fascia Exposed: No Necrotic Amount: None Present (0%) Fat Layer (Subcutaneous Tissue) Exposed: Yes Tendon Exposed: No Muscle Exposed: No Joint Exposed: No Bone Exposed: No Treatment Notes Wound #6 (Lower Leg) Wound Laterality: Right, Lateral Cleanser Normal Saline Discharge Instruction: Cleanse the wound with Normal Saline prior to applying a clean dressing using gauze sponges, not tissue or cotton balls. Soap and Water Discharge Instruction: May shower and wash wound with dial antibacterial soap and water prior to dressing change. Peri-Wound Care Triamcinolone 15 (g) Discharge Instruction: Use triamcinolone 15 (g) as directed Sween Lotion (Moisturizing lotion) Discharge Instruction: Apply moisturizing lotion as directed Topical Primary Dressing KerraCel Ag Gelling Fiber Dressing, 4x5 in (silver alginate) Discharge Instruction: Apply silver alginate to wound bed as instructed Secondary Dressing Woven Gauze Sponge, Non-Sterile 4x4 in Discharge Instruction: Apply over primary dressing as directed. ABD Pad, 8x10 Discharge Instruction: Apply over primary dressing as directed. Secured With Compression Wrap Kerlix Roll 4.5x3.1  (in/yd) Discharge Instruction: Apply Kerlix and Coban compression as directed. Coban Self-Adherent Wrap 4x5 (in/yd) Discharge Instruction: Apply over Kerlix as directed. Compression Stockings Add-Ons Electronic Signature(s) Signed: 05/27/2021 2:58:24 PM By: Lorrin Jackson Signed: 05/27/2021 6:34:46 PM By: Baruch Gouty RN, BSN Entered By: Lorrin Jackson on 05/27/2021 11:27:14 -------------------------------------------------------------------------------- Vitals Details Patient Name: Date of Service: [name], [name] 05/27/2021 11:15 A M Medical Record Number: [id number] Patient Account Number: [id number] Date of Birth/Sex: Treating RN: 08-Jul-1951 (70 y.o. [name] Primary Care Kourtnee Lahey: PA TIENT, [place] Other Clinician: Referring Ambria Mayfield: Treating Milcah Dulany/Extender: Chriss Czar Weeks in Treatment: 8 Vital Signs Time Taken: 11:20 Temperature (F): 98.5 Pulse (bpm): 59 Respiratory Rate (breaths/min): 18 Blood Pressure (mmHg): 119/68 Reference Range: 80 - 120 mg / dl Electronic Signature(s) Signed: 05/27/2021 1:43:18 PM By: [name] Entered By: [name] on 05/27/2021 11:22:11

## 2021-05-27 NOTE — Progress Notes (Signed)
ACHERON, SUGG ([id number]) Visit Report for 05/27/2021 SuperBill Details Patient Name: Date of Service: [name] 05/27/2021 Medical Record Number: [id number] Patient Account Number: [id number] Date of Birth/Sex: Treating RN: [birth year] (70 y.o. 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

## 2021-06-03 ENCOUNTER — Ambulatory Visit (HOSPITAL_COMMUNITY)
Admission: RE | Admit: 2021-06-03 | Discharge: 2021-06-03 | Disposition: A | Discharge status: Home / Self Care | Payer: Medicare Other | Source: Ambulatory Visit | Attending: Internal Medicine | Admitting: Internal Medicine

## 2021-06-03 ENCOUNTER — Other Ambulatory Visit: Payer: Self-pay

## 2021-06-03 ENCOUNTER — Other Ambulatory Visit (HOSPITAL_COMMUNITY): Payer: Self-pay | Admitting: Internal Medicine

## 2021-06-03 DIAGNOSIS — R600 Localized edema: Secondary | ICD-10-CM

## 2021-06-05 ENCOUNTER — Encounter (HOSPITAL_BASED_OUTPATIENT_CLINIC_OR_DEPARTMENT_OTHER): Payer: Medicare Other | Admitting: Internal Medicine

## 2021-06-08 ENCOUNTER — Other Ambulatory Visit: Payer: Self-pay

## 2021-06-08 ENCOUNTER — Encounter (HOSPITAL_BASED_OUTPATIENT_CLINIC_OR_DEPARTMENT_OTHER): Payer: Medicare Other | Admitting: Internal Medicine

## 2021-06-08 DIAGNOSIS — E11622 Type 2 diabetes mellitus with other skin ulcer: Secondary | ICD-10-CM | POA: Diagnosis not present

## 2021-06-08 DIAGNOSIS — L03115 Cellulitis of right lower limb: Secondary | ICD-10-CM | POA: Diagnosis not present

## 2021-06-08 DIAGNOSIS — L97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 with unspecified severity: Secondary | ICD-10-CM

## 2021-06-08 NOTE — Progress Notes (Addendum)
[name], CUI ([id number]) Visit Report for 06/08/2021 Chief Complaint Document Details Patient Name: Date of Service: [name] 06/08/2021 10:15 A M Medical Record Number: [id number] Patient Account Number: [id number] Date of Birth/Sex: Treating RN: November 05, 1951 (70 y.o. [name] Primary Care Provider: PA [name], NO Other Clinician: Referring Provider: Treating Provider/Extender: [name] in Treatment: 10 Information Obtained from: Patient Chief Complaint bilateral lower extremity swelling with wounds Electronic Signature(s) Signed: 06/08/2021 5:09:42 PM By: Kalman Shan DO Entered By: Kalman Shan on 06/08/2021 17:02:39 -------------------------------------------------------------------------------- HPI Details Patient Name: Date of Service: [name], Allen [name] 06/08/2021 10:15 A M Medical Record Number: [id number] Patient Account Number: [id number] Date of Birth/Sex: Treating RN: September 05, 1951 (70 y.o. [name] Primary Care Provider: PA [name], NO Other Clinician: Referring Provider: Treating Provider/Extender: [name] in Treatment: 10 History of Present Illness HPI Description: [name] is a 70 year old male with a past medical history of type 2 diabetes and essential hypertension who presents to the clinic today for bilateral lower extremity wounds. He was recently seen in the ED on 3/9 for bilateral lower extremity edema. He had a DVT study that was negative for clots. He was given triamcinolone cream and advised to elevate legs and obtain compression stockings. He has been using the triamcinolone cream however he is unable to use compression stockings due to difficulty of putting on. Patient states that for the past 2 years he has had opening and closing of weeping wounds to his legs bilaterally. They spontaneously open and close. They are often open for several Reese before they heal  spontaneously. He denies any purulent drainage, increased warmth or increased erythema to the skin. 4/21; patient presents for 1 week follow-up. The wounds on the right leg have healed however he has now 2 new wounds 1 on the anterior and posterior thigh that occurred from him scratching underneath the wrap. The left leg wound has improved and almost closed 4/28; patient presents for 1 week follow-up. He continues to have a wound on the right anterior leg that is showing signs of healing. The posterior right thigh has closed. He now has a new wound to the left leg that is limited to skin breakdown. 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 The wounds are occurring because he goes underneath the leg wraps and scratches creating new wounds. He has no complaints today. 5/5; patient presents for 1 week follow-up. He no longer has any open wounds to his lower extremities bilaterally. He continues to have scattered excoriation marks from where he scratches. These are scabbed over. He states he is going to see dermatology this month. Overall he is doing well and is happy with his care. Readmission 6/10 Patient was followed for open wounds limited to skin breakdown to his lower extremities bilaterally 2/2 venous insufficiency. Once the wounds healed he was discharged with juxta lite compressions and these were placed in office. He was discharged on 5/5. He states that he has not taken the wraps off since discharge and has had them on for over a month. He did not want to take them off because they were very comfortable. He does not have any difficulty putting these on and off if he wanted to. He states that he does not take a shower and just cleans the other parts of his body at the sink. He noticed that 2  days ago he was having some drainage at the top of the wrap and called in to be evaluated. He states that he has not seen the wounds  until the wraps were taken off today in office. He denies pain or increased warmth or erythema to the area 6/27; patient presents for 2-week follow-up. He has had increased swelling and drainage to the wounds over the last week. He reports increased erythema to the area. He does not have pain however. He denies purulent drainage or fever/chills. Electronic Signature(s) Signed: 06/08/2021 5:09:42 PM By: Kalman Shan DO Entered By: Kalman Shan on 06/08/2021 17:04:30 -------------------------------------------------------------------------------- Physical Exam Details Patient Name: Date of Service: [name], Allen [name] 06/08/2021 10:15 A M Medical Record Number: [id number] Patient Account Number: [id number] Date of Birth/Sex: Treating RN: 08/20/1951 (70 y.o. [name] Primary Care Provider: PA [name], NO Other Clinician: Referring Provider: Treating Provider/Extender: [name] in Treatment: 10 Constitutional respirations regular, non-labored and within target range for patient.. Cardiovascular 2+ dorsalis pedis/posterior tibialis pulses. Psychiatric pleasant and cooperative. Notes Right lower extremity: There are open wounds limited to skin breakdown circumferentially with increased swelling and irritation. 2+ pitting edema to the right leg to the knee. Venous stasis dermatitis. Electronic Signature(s) Signed: 06/08/2021 5:09:42 PM By: Kalman Shan DO Entered By: Kalman Shan on 06/08/2021 17:05:25 -------------------------------------------------------------------------------- Physician Orders Details Patient Name: Date of Service: [name], Allen [name] 06/08/2021 10:15 A M Medical Record Number: [id number] Patient Account Number: [id number] Date of Birth/Sex: Treating RN: 07-03-1951 (70 y.o. [name] Primary Care Provider: PA [name], NO Other Clinician: Referring Provider: Treating Provider/Extender: [name] in Treatment: 10 Verbal / Phone Orders: No Diagnosis Coding ICD-10 Coding Code Description L97.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 with unspecified severity L97.829 Non-pressure chronic ulcer of other part of left lower leg with unspecified severity I89.0 Lymphedema, not elsewhere classified I87.2 Venous insufficiency (chronic) (peripheral) L03.115 Cellulitis of right lower limb Follow-up Appointments ppointment in 1 week. - with Dr. Heber Askov Return A Nurse Visit: - Dressing change: Thursday if possible with facility transportation. Bathing/ Shower/ Hygiene May shower with protection but do not get wound dressing(s) wet. - You can use cast protectors...they are available at [facility], etc. Edema Control - Lymphedema / SCD / Other Elevate legs to the level of the heart or above for 30 minutes daily and/or when sitting, a frequency of: - throughout the day 3-4 times a day. Avoid standing for long periods of time. Exercise regularly Other Edema Control Orders/Instructions: - patient to bring in juxtalites HD weekly to wound center. Additional Orders / Instructions Other: - New Order: Kelfex 500mg  BID PO x7 days Wound Treatment Wound #6 - Lower Leg Wound Laterality: Right, Lateral Cleanser: Normal Saline 1 x Per Week/30 Days Discharge Instructions: Cleanse the wound with Normal Saline prior to applying a clean dressing using gauze sponges, not tissue or cotton balls. Cleanser: Soap and Water 1 x Per Week/30 Days Discharge Instructions: May shower and wash wound with dial antibacterial soap and water prior to dressing change. Peri-Wound Care: Triamcinolone 15 (g) 1 x Per Week/30 Days Discharge Instructions: Use triamcinolone 15 (g) as directed Peri-Wound Care: Sween Lotion (Moisturizing lotion) 1 x Per Week/30 Days Discharge Instructions: Apply moisturizing lotion as directed Prim Dressing: KerraCel Ag Gelling Fiber Dressing, 4x5 in  (silver alginate) 1 x Per Week/30 Days ary Discharge Instructions: Apply silver alginate to wound bed as instructed Secondary Dressing:  Woven Gauze Sponge, Non-Sterile 4x4 in 1 x Per Week/30 Days Discharge Instructions: Apply over primary dressing as directed. Secondary Dressing: ABD Pad, 8x10 1 x Per Week/30 Days Discharge Instructions: Apply over primary dressing as directed. Compression Wrap: Kerlix Roll 4.5x3.1 (in/yd) 1 x Per Week/30 Days Discharge Instructions: Apply Kerlix and Coban compression as directed. Compression Wrap: Coban Self-Adherent Wrap 4x5 (in/yd) 1 x Per Week/30 Days Discharge Instructions: Apply over Kerlix as directed. Patient Medications llergies: No Known Allergies A Notifications Medication Indication Start End 06/08/2021 Keflex DOSE 0 - oral 500 mg capsule - 1 capsule bid x 7days Electronic Signature(s) Signed: 06/08/2021 5:09:42 PM By: Kalman Shan DO Previous Signature: 06/08/2021 11:27:09 AM Version By: Lorrin Jackson Entered By: Kalman Shan on 06/08/2021 17:08:56 Prescription 06/08/2021 -------------------------------------------------------------------------------- Ketelsen, Allen L. Kalman Shan DO Patient Name: Provider: 07-26-1951 [id number] Date of Birth: NPI#: Jerilynn Mages [id number] Sex: DEA #: [phone] Phone #: License #: [place] Patient Address: [place] [address] [place], [place] [address] [place], [place] [address] [phone] Allergies No Known Allergies Medication Medication: Route: Strength: Form: Keflex 500 mg capsule oral 500 mg capsule Class: CEPHALOSPORIN ANTIBIOTICS - 1ST GENERATION Dose: Frequency / Time: Indication: 0 1 capsule bid x 7days Number of Refills: Number of Units: 0 Fourteen (14) Generic Substitution: Start Date: End Date: One Time Use: Substitution Permitted 4/31/5400 No Note to Pharmacy: Hand  Signature: Date(s): Electronic Signature(s) Signed: 06/08/2021 5:09:42 PM By: Kalman Shan DO Entered By: Kalman Shan on 06/08/2021 17:08:56 -------------------------------------------------------------------------------- Problem List Details Patient Name: Date of Service: [name], Allen [name] 06/08/2021 10:15 A M Medical Record Number: [id number] Patient Account Number: [id number] Date of Birth/Sex: Treating RN: 12-04-1951 (70 y.o. 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 with unspecified 03/26/2021 No Yes severity L97.829 Non-pressure chronic ulcer of other part of left lower leg with unspecified 03/26/2021 No Yes severity I89.0 Lymphedema, not elsewhere classified 03/26/2021 No Yes I87.2 Venous insufficiency (chronic) (peripheral) 03/26/2021 No Yes L03.115 Cellulitis of right lower limb 06/08/2021 No Yes Inactive Problems Resolved Problems Electronic Signature(s) Signed: 06/08/2021 5:09:42 PM By: Kalman Shan DO Previous Signature: 06/08/2021 11:26:38 AM Version By: Lorrin Jackson Entered By: Kalman Shan on 06/08/2021 17:08:35 -------------------------------------------------------------------------------- Progress Note Details Patient Name: Date of Service: [name], Allen [name] 06/08/2021 10:15 A M Medical Record Number: [id number] Patient Account Number: [id number] Date of Birth/Sex: Treating RN: [birth year] (70 y.o. [name] Primary Care Provider: PA [name], NO Other Clinician: Referring Provider: Treating Provider/Extender: [name] in Treatment: 10 Subjective Chief Complaint Information obtained from Patient bilateral lower extremity swelling with  wounds History of Present Illness (HPI) Jams Trickett is a 70 year old male with a past medical history of type 2 diabetes and essential hypertension who presents to the clinic today for bilateral lower extremity wounds. He was recently seen in the ED on 3/9 for bilateral lower extremity edema. He had a DVT study that was negative for clots. He was given triamcinolone cream and advised to elevate legs and obtain compression stockings. He has been using the triamcinolone cream however he is unable to use compression stockings due to difficulty of putting on. Patient states that for the past 2 years he has had opening and  closing of weeping wounds to his legs bilaterally. They spontaneously open and close. They are often open for several Reese before they heal spontaneously. He denies any purulent drainage, increased warmth or increased erythema to the skin. 4/21; patient presents for 1 week follow-up. The wounds on the right leg have healed however he has now 2 new wounds 1 on the anterior and posterior thigh that occurred from him scratching underneath the wrap. The left leg wound has improved and almost closed 4/28; patient presents for 1 week follow-up. He continues to have a wound on the right anterior leg that is showing signs of healing. The posterior right thigh has closed. He now has a new wound to the left leg that is limited to skin breakdown. 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 The wounds are occurring because he goes underneath the leg wraps and scratches creating new wounds. He has no complaints today. 5/5; patient presents for 1 week follow-up. He no longer has any open wounds to his lower extremities bilaterally. He continues to have scattered excoriation marks from where he scratches. These are scabbed over. He states he is going to see dermatology this month. Overall he is doing well and is happy with  his care. Readmission 6/10 Patient was followed for open wounds limited to skin breakdown to his lower extremities bilaterally 2/2 venous insufficiency. Once the wounds healed he was discharged with juxta lite compressions and these were placed in office. He was discharged on 5/5. He states that he has not taken the wraps off since discharge and has had them on for over a month. He did not want to take them off because they were very comfortable. He does not have any difficulty putting these on and off if he wanted to. He states that he does not take a shower and just cleans the other parts of his body at the sink. He noticed that 2 days ago he was having some drainage at the top of the wrap and called in to be evaluated. He states that he has not seen the wounds until the wraps were taken off today in office. He denies pain or increased warmth or erythema to the area 6/27; patient presents for 2-week follow-up. He has had increased swelling and drainage to the wounds over the last week. He reports increased erythema to the area. He does not have pain however. He denies purulent drainage or fever/chills. Patient History Unable to Obtain Patient History due to Altered Mental Status. Information obtained from Patient. Family History Cancer - Mother,Maternal Grandparents, Heart Disease - Maternal Grandparents, Hypertension - Maternal Grandparents, No family history of Diabetes, Hereditary Spherocytosis. Social History Former smoker - ended on 09/14/1983, Alcohol Use - Rarely, Drug Use - No History, Caffeine Use - Never. Medical History Eyes Patient has history of Cataracts - 2014 Cardiovascular Patient has history of Arrhythmia - A-Fib Endocrine Patient has history of Type II Diabetes Musculoskeletal Patient has history of Osteoarthritis Medical A Surgical History Notes nd Hematologic/Lymphatic Hyperbilirubinemia Gastrointestinal Rectal Pain-Constipation Genitourinary CKD stage  3-BPH Oncologic Colon Cancer-Colon Re-section Objective Constitutional respirations regular, non-labored and within target range for patient.. Vitals Time Taken: 11:04 AM, Temperature: 98.2 F, Pulse: 56 bpm, Respiratory Rate: 18 breaths/min, Blood Pressure: 115/66 mmHg, Pulse Oximetry: 95 %. Cardiovascular 2+ dorsalis pedis/posterior tibialis pulses. Psychiatric pleasant and cooperative. General Notes: Right lower extremity: There are open wounds limited to skin breakdown circumferentially with increased swelling  and irritation. 2+ pitting edema to the right leg to the knee. Venous stasis dermatitis. Integumentary (Hair, Skin) Wound #6 status is Open. Original cause of wound was Gradually Appeared. The date acquired was: 05/22/2021. The wound has been in treatment 2 Reese. The wound is located on the Right,Lateral Lower Leg. The wound measures 4.5cm length x 8.3cm width x 0.1cm depth; 29.335cm^2 area and 2.933cm^3 volume. There is Fat Layer (Subcutaneous Tissue) exposed. There is no tunneling or undermining noted. There is a large amount of serosanguineous drainage noted. The wound margin is distinct with the outline attached to the wound base. There is large (67-100%) red granulation within the wound bed. There is a small (1-33%) amount of necrotic tissue within the wound bed. 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 He is actually tolerated these wraps very well. At this time I will treat him for soft tissue infection with Keflex. I will continue to dress the leg with silver  alginate. I asked he come in later in the week to be reassessed for nurse visit. I will also see him next week Plan Follow-up Appointments: Return Appointment in 1 week. - with Dr. Heber [place] Nurse Visit: - Dressing change: Thursday if possible with facility transportation. Bathing/ Shower/ Hygiene: May shower with protection but do not get wound dressing(s) wet. - You can use cast protectors...they are available at [facility], etc. Edema Control - Lymphedema / SCD / Other: Elevate legs to the level of the heart or above for 30 minutes daily and/or when sitting, a frequency of: - throughout the day 3-4 times a day. Avoid standing for long periods of time. Exercise regularly Other Edema Control Orders/Instructions: - patient to bring in juxtalites HD weekly to wound center. Additional Orders / Instructions: Other: - New Order: Kelfex 500mg  BID PO x7 days The following medication(s) was prescribed: Keflex oral 500 mg capsule 0 1 capsule bid x 7days starting 06/08/2021 WOUND #6: - Lower Leg Wound Laterality: Right, Lateral Cleanser: Normal Saline 1 x Per Week/30 Days Discharge Instructions: Cleanse the wound with Normal Saline prior to applying a clean dressing using gauze sponges, not tissue or cotton balls. Cleanser: Soap and Water 1 x Per Week/30 Days Discharge Instructions: May shower and wash wound with dial antibacterial soap and water prior to dressing change. Peri-Wound Care: Triamcinolone 15 (g) 1 x Per Week/30 Days Discharge Instructions: Use triamcinolone 15 (g) as directed Peri-Wound Care: Sween Lotion (Moisturizing lotion) 1 x Per Week/30 Days Discharge Instructions: Apply moisturizing lotion as directed Prim Dressing: KerraCel Ag Gelling Fiber Dressing, 4x5 in (silver alginate) 1 x Per Week/30 Days ary Discharge Instructions: Apply silver alginate to wound bed as instructed Secondary Dressing: Woven Gauze Sponge, Non-Sterile 4x4 in 1 x Per Week/30 Days Discharge  Instructions: Apply over primary dressing as directed. Secondary Dressing: ABD Pad, 8x10 1 x Per Week/30 Days Discharge Instructions: Apply over primary dressing as directed. Com pression Wrap: Kerlix Roll 4.5x3.1 (in/yd) 1 x Per Week/30 Days Discharge Instructions: Apply Kerlix and Coban compression as directed. Com pression Wrap: Coban Self-Adherent Wrap 4x5 (in/yd) 1 x Per Week/30 Days Discharge Instructions: Apply over Kerlix as directed. 1. Keflex 2. Silver alginate  under Kerlix/Coban Electronic Signature(s) Signed: 06/08/2021 5:09:42 PM By: Kalman Shan DO Entered By: Kalman Shan on 06/08/2021 17:09:07 -------------------------------------------------------------------------------- HxROS Details Patient Name: Date of Service: [name], Allen [name] 06/08/2021 10:15 A M Medical Record Number: [id number] Patient Account Number: [id number] Date of Birth/Sex: Treating RN: 10-13-1951 (70 y.o. [name] Primary Care Provider: PA [name], NO Other Clinician: Referring Provider: Treating Provider/Extender: [name] in Treatment: 10 Unable to Obtain Patient History due to Altered Mental Status Information Obtained From Patient Eyes Medical History: Positive for: Cataracts - 2014 Hematologic/Lymphatic Medical History: Past Medical History Notes: Hyperbilirubinemia Cardiovascular Medical History: Positive for: Arrhythmia - A-Fib Gastrointestinal Medical History: Past Medical History Notes: Rectal Pain-Constipation Endocrine Medical History: Positive for: Type II Diabetes Time with diabetes: Since 2014 Treated with: Oral agents Blood sugar tested every day: Yes Tested : Genitourinary Medical History: Past Medical History Notes: CKD stage 3-BPH Musculoskeletal Medical History: Positive for: Osteoarthritis Oncologic Medical History: Past Medical History Notes: Colon Cancer-Colon Re-section HBO Extended History  Items Eyes: Cataracts Immunizations Pneumococcal Vaccine: Received Pneumococcal Vaccination: No Implantable Devices None Family and Social History Cancer: Yes - Mother,Maternal Grandparents; Diabetes: No; Heart Disease: Yes - Maternal Grandparents; Hereditary Spherocytosis: No; Hypertension: Yes - Maternal Grandparents; Former smoker - ended on 09/14/1983; Alcohol Use: Rarely; Drug Use: No History; Caffeine Use: Never; Financial Concerns: No; Food, Clothing or Shelter Needs: No; Support System Lacking: No; Transportation Concerns: No Electronic Signature(s) Signed: 06/08/2021 5:08:20 PM By: Lorrin Jackson Signed: 06/08/2021 5:09:42 PM By: Kalman Shan DO Entered By: Kalman Shan on 06/08/2021 17:04:36 -------------------------------------------------------------------------------- SuperBill Details Patient Name: Date of Service: [name], Allen [name] 06/08/2021 Medical Record Number: [id number] Patient Account Number: [id number] Date of Birth/Sex: Treating RN: [birth year] (70 y.o. [name] Primary Care Provider: PA [name], NO Other Clinician: Referring Provider: Treating Provider/Extender: [name] in Treatment: 10 Diagnosis Coding ICD-10 Codes Code Description L97.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 with unspecified severity L97.829 Non-pressure chronic ulcer of other part of left lower leg with unspecified severity I89.0 Lymphedema, not elsewhere classified I87.2 Venous insufficiency (chronic) (peripheral) L03.115 Cellulitis of right lower limb Facility Procedures CPT4 Code: [id number] Description: 213 - WOUND CARE VISIT-LEV 3 EST PT Modifier: 1 Quantity: Physician Procedures : CPT4 Code Description Modifier [id number] - WC PHYS LEVEL 4 - EST PT ICD-10 Diagnosis Description L97.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 with unspecified severity L03.115 Cellulitis of right lower  limb Quantity: 1 Electronic Signature(s) Signed: 06/08/2021 5:09:42 PM By: Kalman Shan DO Entered By: Kalman Shan on 06/08/2021 17:09:20

## 2021-06-11 ENCOUNTER — Encounter (HOSPITAL_BASED_OUTPATIENT_CLINIC_OR_DEPARTMENT_OTHER): Payer: Medicare Other | Admitting: Internal Medicine

## 2021-06-16 ENCOUNTER — Other Ambulatory Visit: Payer: Self-pay

## 2021-06-16 ENCOUNTER — Encounter (HOSPITAL_BASED_OUTPATIENT_CLINIC_OR_DEPARTMENT_OTHER): Payer: Medicare Other | Attending: Internal Medicine | Admitting: Internal Medicine

## 2021-06-16 DIAGNOSIS — E1122 Type 2 diabetes mellitus with diabetic chronic kidney disease: Secondary | ICD-10-CM | POA: Diagnosis not present

## 2021-06-16 DIAGNOSIS — I872 Venous insufficiency (chronic) (peripheral): Secondary | ICD-10-CM | POA: Diagnosis not present

## 2021-06-16 DIAGNOSIS — Z87891 Personal history of nicotine dependence: Secondary | ICD-10-CM | POA: Diagnosis not present

## 2021-06-16 DIAGNOSIS — E1151 Type 2 diabetes mellitus with diabetic peripheral angiopathy without gangrene: Secondary | ICD-10-CM | POA: Diagnosis not present

## 2021-06-16 DIAGNOSIS — E11622 Type 2 diabetes mellitus with other skin ulcer: Secondary | ICD-10-CM | POA: Insufficient documentation

## 2021-06-16 DIAGNOSIS — I890 Lymphedema, not elsewhere classified: Secondary | ICD-10-CM | POA: Diagnosis not present

## 2021-06-16 DIAGNOSIS — L97929 Non-pressure chronic ulcer of unspecified part of left lower leg with unspecified severity: Secondary | ICD-10-CM | POA: Insufficient documentation

## 2021-06-16 DIAGNOSIS — L97919 Non-pressure chronic ulcer of unspecified part of right lower leg with unspecified severity: Secondary | ICD-10-CM | POA: Diagnosis not present

## 2021-06-16 DIAGNOSIS — L03116 Cellulitis of left lower limb: Secondary | ICD-10-CM | POA: Diagnosis not present

## 2021-06-16 DIAGNOSIS — I129 Hypertensive chronic kidney disease with stage 1 through stage 4 chronic kidney disease, or unspecified chronic kidney disease: Secondary | ICD-10-CM | POA: Diagnosis not present

## 2021-06-16 DIAGNOSIS — N183 Chronic kidney disease, stage 3 unspecified: Secondary | ICD-10-CM | POA: Insufficient documentation

## 2021-06-16 DIAGNOSIS — L97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 with unspecified severity: Secondary | ICD-10-CM | POA: Diagnosis not present

## 2021-06-16 DIAGNOSIS — L03115 Cellulitis of right lower limb: Secondary | ICD-10-CM | POA: Diagnosis not present

## 2021-06-16 NOTE — Progress Notes (Signed)
HAREL, REPETTO ([id number]) Visit Report for 06/16/2021 Arrival Information Details Patient Name: Date of Service: [name] 06/16/2021 10:15 A M Medical Record Number: [id number] Patient Account Number: [id number] Date of Birth/Sex: Treating RN: [birth year] (70 y.o. [name] Primary Care Patrick Salemi: PA TIENT, NO Other Clinician: Referring Wilson Sample: Treating Akito Boomhower/Extender: [name] in Treatment: 11 Visit Information History Since Last Visit Added or deleted any medications: No Patient Arrived: Walker Any new allergies or adverse reactions: No Arrival Time: 10:43 Had a fall or experienced change in No Accompanied By: self activities of daily living that may affect Transfer Assistance: None risk of falls: Patient Identification Verified: Yes Signs or symptoms of abuse/neglect since last visito No Secondary Verification Process Completed: Yes Hospitalized since last visit: No Patient Requires Transmission-Based Precautions: No Implantable device outside of the clinic excluding No Patient Has Alerts: Yes cellular tissue based products placed in the center Patient Alerts: Patient on Blood Thinner since last visit: R ABI=Non compressible Has Dressing in Place as Prescribed: Yes Has Compression in Place as Prescribed: Yes Pain Present Now: No Electronic Signature(s) Signed: 06/16/2021 7:10:16 PM By: Baruch Gouty RN, BSN Entered By: Baruch Gouty on 06/16/2021 10:45:02 -------------------------------------------------------------------------------- Clinic Level of Care Assessment Details Patient Name: Date of Service: [name], [name] 06/16/2021 10:15 A M Medical Record Number: [id number] Patient Account Number: [id number] Date of Birth/Sex: Treating RN: 11-15-1951 (70 y.o. [name] Primary Care Amany Rando: PA TIENT, NO Other Clinician: Referring Tay Whitwell: Treating Shayne Diguglielmo/Extender: [name] in  Treatment: 11 Clinic Level of Care Assessment Items TOOL 4 Quantity Score X- 1 0 Use when only an EandM is performed on FOLLOW-UP visit ASSESSMENTS - Nursing Assessment / Reassessment X- 1 10 Reassessment of Co-morbidities (includes updates in patient status) X- 1 5 Reassessment of Adherence to Treatment Plan ASSESSMENTS - Wound and Skin A ssessment / Reassessment X - Simple Wound Assessment / Reassessment - one wound 1 5 []  - 0 Complex Wound Assessment / Reassessment - multiple wounds []  - 0 Dermatologic / Skin Assessment (not related to wound area) ASSESSMENTS - Focused Assessment X- 1 5 Circumferential Edema Measurements - multi extremities X- 1 10 Nutritional Assessment / Counseling / Intervention []  - 0 Lower Extremity Assessment (monofilament, tuning fork, pulses) []  - 0 Peripheral Arterial Disease Assessment (using hand held doppler) ASSESSMENTS - Ostomy and/or Continence Assessment and Care []  - 0 Incontinence Assessment and Management []  - 0 Ostomy Care Assessment and Management (repouching, etc.) PROCESS - Coordination of Care X - Simple Patient / Family Education for ongoing care 1 15 []  - 0 Complex (extensive) Patient / Family Education for ongoing care X- 1 10 Staff obtains Programmer, systems, Records, T Results / Process Orders est []  - 0 Staff telephones HHA, Nursing Homes / Clarify orders / etc []  - 0 Routine Transfer to another Facility (non-emergent condition) []  - 0 Routine Hospital Admission (non-emergent condition) []  - 0 New Admissions / Biomedical engineer / Ordering NPWT Apligraf, etc. , []  - 0 Emergency Hospital Admission (emergent condition) X- 1 10 Simple Discharge Coordination []  - 0 Complex (extensive) Discharge Coordination PROCESS - Special Needs []  - 0 Pediatric / Minor Patient Management []  - 0 Isolation Patient Management []  - 0 Hearing / Language / Visual special needs []  - 0 Assessment of Community assistance (transportation,  D/C planning, etc.) []  - 0 Additional assistance / Altered mentation []  - 0 Support Surface(s) Assessment (bed, cushion, seat, etc.) INTERVENTIONS -  Wound Cleansing / Measurement X - Simple Wound Cleansing - one wound 1 5 []  - 0 Complex Wound Cleansing - multiple wounds X- 1 5 Wound Imaging (photographs - any number of wounds) []  - 0 Wound Tracing (instead of photographs) X- 1 5 Simple Wound Measurement - one wound []  - 0 Complex Wound Measurement - multiple wounds INTERVENTIONS - Wound Dressings []  - 0 Small Wound Dressing one or multiple wounds []  - 0 Medium Wound Dressing one or multiple wounds X- 1 20 Large Wound Dressing one or multiple wounds []  - 0 Application of Medications - topical []  - 0 Application of Medications - injection INTERVENTIONS - Miscellaneous []  - 0 External ear exam []  - 0 Specimen Collection (cultures, biopsies, blood, body fluids, etc.) []  - 0 Specimen(s) / Culture(s) sent or taken to Lab for analysis []  - 0 Patient Transfer (multiple staff / Civil Service fast streamer / Similar devices) []  - 0 Simple Staple / Suture removal (25 or less) []  - 0 Complex Staple / Suture removal (26 or more) []  - 0 Hypo / Hyperglycemic Management (close monitor of Blood Glucose) []  - 0 Ankle / Brachial Index (ABI) - do not check if billed separately X- 1 5 Vital Signs Has the patient been seen at the hospital within the last three years: Yes Total Score: 110 Level Of Care: New/Established - Level 3 Electronic Signature(s) Signed: 06/16/2021 6:55:28 PM By: Deon Pilling Entered By: Deon Pilling on 06/16/2021 11:21:42 -------------------------------------------------------------------------------- Lower Extremity Assessment Details Patient Name: Date of Service: [name], [name] 06/16/2021 10:15 A M Medical Record Number: [id number] Patient Account Number: [id number] Date of Birth/Sex: Treating RN: [birth year] (70 y.o. [name] Primary Care Veta Dambrosia: PA [name],  [place] Other Clinician: Referring Makhai Fulco: Treating Myasia Sinatra/Extender: [name]: 11 Edema Assessment Assessed: [Left: No] [Right: No] Edema: [Left: Ye] [Right: s] Calf Left: Right: Point of Measurement: 39 cm From Medial Instep 40.5 cm Ankle Left: Right: Point of Measurement: 13 cm From Medial Instep 27.2 cm Vascular Assessment Pulses: Dorsalis Pedis Palpable: [Right:Yes] Electronic Signature(s) Signed: 06/16/2021 7:10:16 PM By: Baruch Gouty RN, BSN Entered By: Baruch Gouty on 06/16/2021 10:50:52 -------------------------------------------------------------------------------- Multi Wound Chart Details Patient Name: Date of Service: [name], [name] 06/16/2021 10:15 A M Medical Record Number: [id number] Patient Account Number: [id number] Date of Birth/Sex: Treating RN: [birth year] (70 y.o. [name] Primary Care Modean Mccullum: PA [name], [place] Other Clinician: Referring Shyane Fossum: Treating Aishia Barkey/Extender: [name]: 11 Vital Signs Height(in): Pulse(bpm): 51 Weight(lbs): Blood Pressure(mmHg): 133/67 Body Mass Index(BMI): Temperature(F): 98.7 Respiratory Rate(breaths/min): 18 Photos: [6:No Photos Right, Lateral Lower Leg] [N/A:N/A N/A] Wound Location: [6:Gradually Appeared] [N/A:N/A] Wounding Event: [6:Diabetic Wound/Ulcer of the Lower] [N/A:N/A] Primary Etiology: [6:Extremity Cataracts, Arrhythmia, Type II] [N/A:N/A] Comorbid History: [6:Diabetes, Osteoarthritis 05/22/2021] [N/A:N/A] Date Acquired: [6:3] [N/A:N/A] Weeks of Treatment: [6:Open] [N/A:N/A] Wound Status: [6:0.7x0.9x0.1] [N/A:N/A] Measurements L x W x D (cm) [6:0.495] [N/A:N/A] A (cm) : rea [6:0.049] [N/A:N/A] Volume (cm) : [6:87.40%] [N/A:N/A] % Reduction in A rea: [6:87.50%] [N/A:N/A] % Reduction in Volume: [6:Grade 2] [N/A:N/A] Classification: [6:Small] [N/A:N/A] Exudate A mount: [6:Serous] [N/A:N/A] Exudate Type:  [6:amber] [N/A:N/A] Exudate Color: [6:Flat and Intact] [N/A:N/A] Wound Margin: [6:Large (67-100%)] [N/A:N/A] Granulation A mount: [6:Red] [N/A:N/A] Granulation Quality: [6:None Present (0%)] [N/A:N/A] Necrotic A mount: [6:Fat Layer (Subcutaneous Tissue): Yes N/A] Exposed Structures: [6:Fascia: No Tendon: No Muscle: No Joint: No Bone: No Medium (34-66%)] [N/A:N/A] Treatment Notes Electronic Signature(s) Signed: 06/16/2021 12:48:45 PM By:  Kalman Shan DO Signed: 06/16/2021 6:55:28 PM By: Deon Pilling Entered By: Kalman Shan on 06/16/2021 12:09:56 -------------------------------------------------------------------------------- Multi-Disciplinary Care Plan Details Patient Name: Date of Service: [name], [name] 06/16/2021 10:15 A M Medical Record Number: [id number] Patient Account Number: [id number] Date of Birth/Sex: Treating RN: 02-Aug-1951 (70 y.o. [name] Primary Care Jazmaine Fuelling: PA [name], [place] Other Clinician: Referring Redonna Wilbert: Treating Anayansi Rundquist/Extender: [name]: 11 Active Inactive Venous Leg Ulcer Nursing Diagnoses: Actual venous Insuffiency (use after diagnosis is confirmed) Goals: Patient will maintain optimal edema control Date Initiated: 05/22/2021 Target Resolution Date: 07/10/2021 Goal Status: Active Interventions: Assess peripheral edema status every visit. Compression as ordered Provide education on venous insufficiency Treatment Activities: Therapeutic compression applied : 05/22/2021 Notes: Wound/Skin Impairment Nursing Diagnoses: Impaired tissue integrity Knowledge deficit related to ulceration/compromised skin integrity Goals: Patient/caregiver will verbalize understanding of skin care regimen Date Initiated: 03/26/2021 Target Resolution Date: 07/08/2021 Goal Status: Active Ulcer/skin breakdown will have a volume reduction of 30% by week 4 Date Initiated: 03/26/2021 Date Inactivated: 06/16/2021 Target  Resolution Date: 06/19/2021 Unmet Reason: see wound Goal Status: Unmet measurements. Week 11 today. Interventions: Assess patient/caregiver ability to obtain necessary supplies Assess patient/caregiver ability to perform ulcer/skin care regimen upon admission and as needed Assess ulceration(s) every visit Provide education on ulcer and skin care Notes: Electronic Signature(s) Signed: 06/16/2021 6:55:28 PM By: Deon Pilling Entered By: Deon Pilling on 06/16/2021 10:10:14 -------------------------------------------------------------------------------- Pain Assessment Details Patient Name: Date of Service: [name], [name] 06/16/2021 10:15 A M Medical Record Number: [id number] Patient Account Number: [id number] Date of Birth/Sex: Treating RN: 1951/11/22 (70 y.o. [name] Primary Care Niobe Dick: PA [name], [place] Other Clinician: Referring Devonda Pequignot: Treating Hatley Henegar/Extender: [name]: 11 Active Problems Location of Pain Severity and Description of Pain Patient Has Paino No Site Locations Rate the pain. Current Pain Level: 0 Pain Management and Medication Current Pain Management: Electronic Signature(s) Signed: 06/16/2021 7:10:16 PM By: Baruch Gouty RN, BSN Entered By: Baruch Gouty on 06/16/2021 10:46:13 -------------------------------------------------------------------------------- Patient/Caregiver Education Details Patient Name: Date of Service: [name], [name] 7/5/2022andnbsp10:15 A M Medical Record Number: [id number] Patient Account Number: [id number] Date of Birth/Gender: Treating RN: [birth year] (70 y.o. [name] Primary Care Physician: PA [name], [place] Other Clinician: Referring Physician: Treating Physician/Extender: [name] in Treatment: 11 Education Assessment Education Provided To: Patient Education Topics Provided Venous: Handouts: Managing Venous Disease and Related  Ulcers Methods: Explain/Verbal Responses: Reinforcements needed Electronic Signature(s) Signed: 06/16/2021 6:55:28 PM By: Deon Pilling Entered By: Deon Pilling on 06/16/2021 10:10:27 -------------------------------------------------------------------------------- Wound Assessment Details Patient Name: Date of Service: [name], [name] 06/16/2021 10:15 A M Medical Record Number: [id number] Patient Account Number: [id number] Date of Birth/Sex: Treating RN: 18-Apr-1951 (70 y.o. [name] Primary Care Asante Blanda: PA [name], [place] Other Clinician: Referring Sheretha Shadd: Treating Tynisha Ogan/Extender: [name]: 11 Wound Status Wound Number: 6 Primary Etiology: Diabetic Wound/Ulcer of the Lower Extremity Wound Location: Right, Lateral Lower Leg Wound Status: Open Wounding Event: Gradually Appeared Comorbid History: Cataracts, Arrhythmia, Type II Diabetes, Osteoarthritis Date Acquired: 05/22/2021 Weeks Of Treatment: 3 Clustered Wound: No Photos Wound Measurements Length: (cm) 0.7 Width: (cm) 0.9 Depth: (cm) 0.1 Area: (cm) 0.495 Volume: (cm) 0.049 % Reduction in Area: 87.4% % Reduction in Volume: 87.5% Epithelialization: Medium (34-66%) Tunneling: No Undermining: No Wound Description Classification: Grade 2 Wound Margin: Flat and Intact Exudate Amount: Small Exudate  Type: Serous Exudate Color: amber Foul Odor After Cleansing: No Slough/Fibrino No Wound Bed Granulation Amount: Large (67-100%) Exposed Structure Granulation Quality: Red Fascia Exposed: No Necrotic Amount: None Present (0%) Fat Layer (Subcutaneous Tissue) Exposed: Yes Tendon Exposed: No Muscle Exposed: No Joint Exposed: No Bone Exposed: No Electronic Signature(s) Signed: 06/16/2021 5:48:24 PM By: [name] Signed: 06/16/2021 7:10:16 PM By: Baruch Gouty RN, BSN Entered By: [name] on 06/16/2021  17:20:40 -------------------------------------------------------------------------------- Vitals Details Patient Name: Date of Service: [name], [name] 06/16/2021 10:15 A M Medical Record Number: [id number] Patient Account Number: [id number] Date of Birth/Sex: Treating RN: 12-04-1951 (70 y.o. [name] Primary Care Salim Forero: PA TIENT, [place] Other Clinician: Referring Karolyn Messing: Treating Yareni Creps/Extender: [name]: 11 Vital Signs Time Taken: 10:45 Temperature (F): 98.7 Pulse (bpm): 51 Respiratory Rate (breaths/min): 18 Blood Pressure (mmHg): 133/67 Reference Range: 80 - 120 mg / dl Electronic Signature(s) Signed: 06/16/2021 7:10:16 PM By: Baruch Gouty RN, BSN Entered By: Baruch Gouty on 06/16/2021 10:46:02

## 2021-06-16 NOTE — Progress Notes (Signed)
KARSTEN, HOWRY ([id number]) Visit Report for 06/16/2021 Chief Complaint Document Details Patient Name: Date of Service: [name] Allen L. 06/16/2021 10:15 A M Medical Record Number: [id number] Patient Account Number: [id number] Date of Birth/Sex: Treating RN: 09-18-1951 (70 y.o. [name] Primary Care Provider: PA [name], NO Other Clinician: Referring Provider: Treating Provider/Extender: [name]: 11 Information Obtained from: Patient Chief Complaint bilateral lower extremity swelling with wounds Electronic Signature(s) Signed: 06/16/2021 12:48:45 PM By: Kalman Shan DO Entered By: Kalman Shan on 06/16/2021 12:10:03 -------------------------------------------------------------------------------- HPI Details Patient Name: Date of Service: [name], [name] 06/16/2021 10:15 A M Medical Record Number: [id number] Patient Account Number: [id number] Date of Birth/Sex: Treating RN: 1951/04/28 (70 y.o. [name] Primary Care Provider: PA [name], NO Other Clinician: Referring Provider: Treating Provider/Extender: [name]: 11 History of Present Illness HPI Description: Tymir Terral is a 70 year old male with a past medical history of type 2 diabetes and essential hypertension who presents to the clinic today for bilateral lower extremity wounds. He was recently seen in the ED on 3/9 for bilateral lower extremity edema. He had a DVT study that was negative for clots. He was given triamcinolone cream and advised to elevate legs and obtain compression stockings. He has been using the triamcinolone cream however he is unable to use compression stockings due to difficulty of putting on. Patient states that for the past 2 years he has had opening and closing of weeping wounds to his legs bilaterally. They spontaneously open and close. They are often open for several weeks before they heal  spontaneously. He denies any purulent drainage, increased warmth or increased erythema to the skin. 4/21; patient presents for 1 week follow-up. The wounds on the right leg have healed however he has now 2 new wounds 1 on the anterior and posterior thigh that occurred from him scratching underneath the wrap. The left leg wound has improved and almost closed 4/28; patient presents for 1 week follow-up. He continues to have a wound on the right anterior leg that is showing signs of healing. The posterior right thigh has closed. He now has a new wound to the left leg that is limited to skin breakdown. 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 The wounds are occurring because he goes underneath the leg wraps and scratches creating new wounds. He has no complaints today. 5/5; patient presents for 1 week follow-up. He no longer has any open wounds to his lower extremities bilaterally. He continues to have scattered excoriation marks from where he scratches. These are scabbed over. He states he is going to see dermatology this month. Overall he is doing well and is happy with his care. Readmission 6/10 Patient was followed for open wounds limited to skin breakdown to his lower extremities bilaterally 2/2 venous insufficiency. Once the wounds healed he was discharged with juxta lite compressions and these were placed in office. He was discharged on 5/5. He states that he has not taken the wraps off since discharge and has had them on for over a month. He did not want to take them off because they were very comfortable. He does not have any difficulty putting these on and off if he wanted to. He states that he does not take a shower and just cleans the other parts of his body at the sink. He noticed that 2  days ago he was having some drainage at the top of the wrap and called in to be evaluated. He states that he has not seen the wounds  until the wraps were taken off today in office. He denies pain or increased warmth or erythema to the area 6/27; patient presents for 2-week follow-up. He has had increased swelling and drainage to the wounds over the last week. He reports increased erythema to the area. He does not have pain however. He denies purulent drainage or fever/chills. 7/5; patient presents for 1 week follow-up. He was prescribed Keflex at last clinic visit and is currently taking this. He reports significant improvement to his leg swelling, tenderness and erythema. He denies signs of infection. Electronic Signature(s) Signed: 06/16/2021 12:48:45 PM By: Kalman Shan DO Entered By: Kalman Shan on 06/16/2021 12:10:44 -------------------------------------------------------------------------------- Physical Exam Details Patient Name: Date of Service: [name], [name] 06/16/2021 10:15 A M Medical Record Number: [id number] Patient Account Number: [id number] Date of Birth/Sex: Treating RN: 07-11-1951 (70 y.o. [name] Primary Care Provider: PA [name], NO Other Clinician: Referring Provider: Treating Provider/Extender: [name]: 11 Constitutional respirations regular, non-labored and within target range for patient.. Cardiovascular 2+ dorsalis pedis/posterior tibialis pulses. Psychiatric pleasant and cooperative. Notes Right lower extremity: Scattered small open wounds limited to skin breakdown to the right lower extremity. 2+ pitting edema to the knee. Venous stasis dermatitis Electronic Signature(s) Signed: 06/16/2021 12:48:45 PM By: Kalman Shan DO Entered By: Kalman Shan on 06/16/2021 12:14:55 -------------------------------------------------------------------------------- Physician Orders Details Patient Name: Date of Service: [name], [name] 06/16/2021 10:15 A M Medical Record Number: [id number] Patient Account Number: [id number] Date of  Birth/Sex: Treating RN: [birth year] (70 y.o. [name] Primary Care Provider: PA TIENT, NO Other Clinician: Referring Provider: Treating Provider/Extender: [name] in Treatment: 46 Verbal / Phone Orders: No Diagnosis Coding ICD-10 Coding Code Description L97.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 with unspecified severity L97.829 Non-pressure chronic ulcer of other part of left lower leg with unspecified severity I89.0 Lymphedema, not elsewhere classified I87.2 Venous insufficiency (chronic) (peripheral) L03.115 Cellulitis of right lower limb Follow-up Appointments ppointment in 2 weeks. - with Dr. Heber [place] Monday Return A Nurse Visit: - x1 week Monday Bathing/ Shower/ Hygiene May shower with protection but do not get wound dressing(s) wet. - You can use cast protectors...they are available at [facility], etc. Edema Control - Lymphedema / SCD / Other Elevate legs to the level of the heart or above for 30 minutes daily and/or when sitting, a frequency of: - throughout the day 3-4 times a day. Avoid standing for long periods of time. Exercise regularly Other Edema Control Orders/Instructions: - patient to bring in juxtalites HD weekly to wound center. Wound Treatment Wound #6 - Lower Leg Wound Laterality: Right, Lateral Cleanser: Normal Saline 1 x Per Week/30 Days Discharge Instructions: Cleanse the wound with Normal Saline prior to applying a clean dressing using gauze sponges, not tissue or cotton balls. Cleanser: Soap and Water 1 x Per Week/30 Days Discharge Instructions: May shower and wash wound with dial antibacterial soap and water prior to dressing change. Peri-Wound Care: Triamcinolone 15 (g) 1 x Per Week/30 Days Discharge Instructions: Use triamcinolone 15 (g) as directed Peri-Wound Care: Sween Lotion (Moisturizing lotion) 1 x Per Week/30 Days Discharge Instructions: Apply moisturizing lotion as  directed Prim Dressing: KerraCel Ag Gelling Fiber Dressing, 4x5 in (silver alginate) 1 x Per Week/30 Days  ary Discharge Instructions: Apply silver alginate to wound bed as instructed Secondary Dressing: Woven Gauze Sponge, Non-Sterile 4x4 in 1 x Per Week/30 Days Discharge Instructions: Apply over primary dressing as directed. Secondary Dressing: ABD Pad, 8x10 1 x Per Week/30 Days Discharge Instructions: Apply over primary dressing as directed. Compression Wrap: Kerlix Roll 4.5x3.1 (in/yd) 1 x Per Week/30 Days Discharge Instructions: Apply Kerlix and Coban compression as directed. Compression Wrap: Coban Self-Adherent Wrap 4x5 (in/yd) 1 x Per Week/30 Days Discharge Instructions: Apply over Kerlix as directed. Electronic Signature(s) Signed: 06/16/2021 12:48:45 PM By: Kalman Shan DO Entered By: Kalman Shan on 06/16/2021 12:15:31 -------------------------------------------------------------------------------- Problem List Details Patient Name: Date of Service: [name], [name] 06/16/2021 10:15 A M Medical Record Number: [id number] Patient Account Number: [id number] Date of Birth/Sex: Treating RN: 10/30/1951 (70 y.o. 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 with unspecified 03/26/2021 No Yes severity L97.829 Non-pressure chronic ulcer of other part of left lower leg with unspecified 03/26/2021 No Yes severity I89.0 Lymphedema, not elsewhere classified 03/26/2021 No Yes I87.2 Venous insufficiency (chronic) (peripheral) 03/26/2021 No Yes L03.115 Cellulitis of right lower limb 06/08/2021 No Yes Inactive Problems Resolved Problems Electronic Signature(s) Signed: 06/16/2021 12:48:45 PM By: Kalman Shan  DO Entered By: Kalman Shan on 06/16/2021 12:09:48 -------------------------------------------------------------------------------- Progress Note Details Patient Name: Date of Service: [name], [name] 06/16/2021 10:15 A M Medical Record Number: [id number] Patient Account Number: [id number] Date of Birth/Sex: Treating RN: 1951/08/06 (70 y.o. [name] Primary Care Provider: PA [name], NO Other Clinician: Referring Provider: Treating Provider/Extender: [name]: 11 Subjective Chief Complaint Information obtained from Patient bilateral lower extremity swelling with wounds History of Present Illness (HPI) Keiden Deskin is a 70 year old male with a past medical history of type 2 diabetes and essential hypertension who presents to the clinic today for bilateral lower extremity wounds. He was recently seen in the ED on 3/9 for bilateral lower extremity edema. He had a DVT study that was negative for clots. He was given triamcinolone cream and advised to elevate legs and obtain compression stockings. He has been using the triamcinolone cream however he is unable to use compression stockings due to difficulty of putting on. Patient states that for the past 2 years he has had opening and closing of weeping wounds to his legs bilaterally. They spontaneously open and close. They are often open for several weeks before they heal spontaneously. He denies any purulent drainage, increased warmth or increased erythema to the skin. 4/21; patient presents for 1 week follow-up. The wounds on the right leg have healed however he has now 2 new wounds 1 on the anterior and posterior thigh that occurred from him scratching underneath the wrap. The left leg wound has improved and almost closed 4/28; patient presents for 1 week follow-up. He continues to have a wound on the right anterior leg that is showing signs of healing. The posterior right thigh has closed.  He now has a new wound to the left leg that is limited to skin breakdown. 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 The wounds are occurring because he goes underneath the leg wraps and scratches creating new wounds. He has no  complaints today. 5/5; patient presents for 1 week follow-up. He no longer has any open wounds to his lower extremities bilaterally. He continues to have scattered excoriation marks from where he scratches. These are scabbed over. He states he is going to see dermatology this month. Overall he is doing well and is happy with his care. Readmission 6/10 Patient was followed for open wounds limited to skin breakdown to his lower extremities bilaterally 2/2 venous insufficiency. Once the wounds healed he was discharged with juxta lite compressions and these were placed in office. He was discharged on 5/5. He states that he has not taken the wraps off since discharge and has had them on for over a month. He did not want to take them off because they were very comfortable. He does not have any difficulty putting these on and off if he wanted to. He states that he does not take a shower and just cleans the other parts of his body at the sink. He noticed that 2 days ago he was having some drainage at the top of the wrap and called in to be evaluated. He states that he has not seen the wounds until the wraps were taken off today in office. He denies pain or increased warmth or erythema to the area 6/27; patient presents for 2-week follow-up. He has had increased swelling and drainage to the wounds over the last week. He reports increased erythema to the area. He does not have pain however. He denies purulent drainage or fever/chills. 7/5; patient presents for 1 week follow-up. He was prescribed Keflex at last clinic visit and is currently taking this. He reports significant improvement to his leg swelling, tenderness  and erythema. He denies signs of infection. Patient History Unable to Obtain Patient History due to Altered Mental Status. Information obtained from Patient. Family History Cancer - Mother,Maternal Grandparents, Heart Disease - Maternal Grandparents, Hypertension - Maternal Grandparents, No family history of Diabetes, Hereditary Spherocytosis. Social History Former smoker - ended on 09/14/1983, Alcohol Use - Rarely, Drug Use - No History, Caffeine Use - Never. Medical History Eyes Patient has history of Cataracts - 2014 Cardiovascular Patient has history of Arrhythmia - A-Fib Endocrine Patient has history of Type II Diabetes Musculoskeletal Patient has history of Osteoarthritis Medical A Surgical History Notes nd Hematologic/Lymphatic Hyperbilirubinemia Gastrointestinal Rectal Pain-Constipation Genitourinary CKD stage 3-BPH Oncologic Colon Cancer-Colon Re-section Objective Constitutional respirations regular, non-labored and within target range for patient.. Vitals Time Taken: 10:45 AM, Temperature: 98.7 F, Pulse: 51 bpm, Respiratory Rate: 18 breaths/min, Blood Pressure: 133/67 mmHg. Cardiovascular 2+ dorsalis pedis/posterior tibialis pulses. Psychiatric pleasant and cooperative. General Notes: Right lower extremity: Scattered small open wounds limited to skin breakdown to the right lower extremity. 2+ pitting edema to the knee. Venous stasis dermatitis Integumentary (Hair, Skin) Wound #6 status is Open. Original cause of wound was Gradually Appeared. The date acquired was: 05/22/2021. The wound has been in treatment 3 weeks. The wound is located on the Right,Lateral Lower Leg. The wound measures 0.7cm length x 0.9cm width x 0.1cm depth; 0.495cm^2 area and 0.049cm^3 volume. There is Fat Layer (Subcutaneous Tissue) exposed. There is no tunneling or undermining noted. There is a small amount of serous drainage noted. The wound margin is flat and intact. There is large  (67-100%) red granulation within the wound bed. There is no necrotic tissue within the wound bed. 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 Erythema and swelling is improved. He has some scattered open wounds still present limited to skin breakdown. I recommended using silver alginate to this under Kerlix/Coban. I suspect these will close up in the next 2 weeks Plan Follow-up Appointments: Return Appointment in 2 weeks. - with Dr. Heber [place] Monday Nurse Visit: - x1 week Monday Bathing/ Shower/ Hygiene: May shower with protection but do not get wound dressing(s) wet. - You can use cast protectors...they are available at [facility], etc. Edema Control - Lymphedema / SCD / Other: Elevate legs to the level of the heart or above for 30 minutes daily and/or when sitting, a frequency of: - throughout the day 3-4 times a day. Avoid standing for long periods of time. Exercise regularly Other Edema Control Orders/Instructions: - patient to bring in juxtalites HD weekly to wound center. WOUND #6: - Lower Leg Wound Laterality: Right, Lateral Cleanser: Normal Saline 1 x Per Week/30 Days Discharge Instructions: Cleanse the wound with Normal Saline prior to applying a clean dressing using gauze sponges, not tissue or cotton balls. Cleanser: Soap and Water 1 x Per Week/30 Days Discharge Instructions: May shower and wash wound with dial antibacterial soap and water prior to dressing change. Peri-Wound Care: Triamcinolone 15 (g) 1 x Per Week/30 Days Discharge Instructions: Use triamcinolone 15 (g) as directed Peri-Wound Care:  Sween Lotion (Moisturizing lotion) 1 x Per Week/30 Days Discharge Instructions: Apply moisturizing lotion as directed Prim Dressing: KerraCel Ag Gelling Fiber Dressing, 4x5 in (silver alginate) 1 x Per Week/30 Days ary Discharge Instructions: Apply silver alginate to wound bed as instructed Secondary Dressing: Woven Gauze Sponge, Non-Sterile 4x4 in 1 x Per Week/30 Days Discharge Instructions: Apply over primary dressing as directed. Secondary Dressing: ABD Pad, 8x10 1 x Per Week/30 Days Discharge Instructions: Apply over primary dressing as directed. Com pression Wrap: Kerlix Roll 4.5x3.1 (in/yd) 1 x Per Week/30 Days Discharge Instructions: Apply Kerlix and Coban compression as directed. Com pression Wrap: Coban Self-Adherent Wrap 4x5 (in/yd) 1 x Per Week/30 Days Discharge Instructions: Apply over Kerlix as directed. 1. Silver alginate under Kerlix/Coban 2. Nurse visit in 1 week 3. Visit with me in 2 weeks Electronic Signature(s) Signed: 06/16/2021 12:48:45 PM By: Kalman Shan DO Entered By: Kalman Shan on 06/16/2021 12:48:00 -------------------------------------------------------------------------------- HxROS Details Patient Name: Date of Service: [name], [name] 06/16/2021 10:15 A M Medical Record Number: [id number] Patient Account Number: [id number] Date of Birth/Sex: Treating RN: 12/09/1951 (69 y.o. [name] Primary Care Provider: PA [name], NO Other Clinician: Referring Provider: Treating Provider/Extender: [name]: 71 Unable to Obtain Patient History due to Altered Mental Status Information Obtained From Patient Eyes Medical History: Positive for: Cataracts - 2014 Hematologic/Lymphatic Medical History: Past Medical History Notes: Hyperbilirubinemia Cardiovascular Medical History: Positive for: Arrhythmia - A-Fib Gastrointestinal Medical History: Past Medical History Notes: Rectal  Pain-Constipation Endocrine Medical History: Positive for: Type II Diabetes Time with diabetes: Since 2014 Treated with: Oral agents Blood sugar tested every day: Yes Tested : Genitourinary Medical History: Past Medical History Notes: CKD stage 3-BPH Musculoskeletal Medical History: Positive for: Osteoarthritis Oncologic Medical History: Past Medical History Notes: Colon Cancer-Colon Re-section HBO Extended History Items Eyes: Cataracts Immunizations Pneumococcal Vaccine: Received Pneumococcal Vaccination: No Implantable Devices None Family and Social History Cancer: Yes - Mother,Maternal Grandparents;  Diabetes: No; Heart Disease: Yes - Maternal Grandparents; Hereditary Spherocytosis: No; Hypertension: Yes - Maternal Grandparents; Former smoker - ended on 09/14/1983; Alcohol Use: Rarely; Drug Use: No History; Caffeine Use: Never; Financial Concerns: No; Food, Clothing or Shelter Needs: No; Support System Lacking: No; Transportation Concerns: No Electronic Signature(s) Signed: 06/16/2021 12:48:45 PM By: Kalman Shan DO Signed: 06/16/2021 6:55:28 PM By: Deon Pilling Entered By: Kalman Shan on 06/16/2021 12:10:49 -------------------------------------------------------------------------------- SuperBill Details Patient Name: Date of Service: [name], [name] 06/16/2021 Medical Record Number: [id number] Patient Account Number: [id number] Date of Birth/Sex: Treating RN: Oct 06, 1951 (70 y.o. [name] Primary Care Provider: PA TIENT, NO Other Clinician: Referring Provider: Treating Provider/Extender: [name]: 11 Diagnosis Coding ICD-10 Codes Code Description L97.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 with unspecified severity L97.829 Non-pressure chronic ulcer of other part of left lower leg with unspecified severity I89.0 Lymphedema, not elsewhere classified I87.2 Venous insufficiency  (chronic) (peripheral) L03.115 Cellulitis of right lower limb Facility Procedures CPT4 Code: [id number] Description: 99213 - WOUND CARE VISIT-LEV 3 EST PT Modifier: Quantity: 1 Physician Procedures : CPT4 Code Description Modifier [id number] - WC PHYS LEVEL 3 - EST PT ICD-10 Diagnosis Description L97.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 with unspecified severity I87.2 Venous insufficiency (chronic) (peripheral) I89.0  Lymphedema, not elsewhere classified Quantity: 1 Electronic Signature(s) Signed: 06/16/2021 12:48:45 PM By: Kalman Shan DO Entered By: Kalman Shan on 06/16/2021 12:48:21

## 2021-06-22 ENCOUNTER — Other Ambulatory Visit: Payer: Self-pay

## 2021-06-22 ENCOUNTER — Encounter (HOSPITAL_BASED_OUTPATIENT_CLINIC_OR_DEPARTMENT_OTHER): Payer: Medicare Other | Admitting: Internal Medicine

## 2021-06-22 DIAGNOSIS — E11622 Type 2 diabetes mellitus with other skin ulcer: Secondary | ICD-10-CM | POA: Diagnosis not present

## 2021-06-22 NOTE — Progress Notes (Signed)
Allen, Reese ([id number]) Visit Report for 06/22/2021 Arrival Information Details Patient Name: Date of Service: [name] [name] 06/22/2021 10:30 A M Medical Record Number: [id number] Patient Account Number: [id number] Date of Birth/Sex: Treating RN: [birth year] (70 y.o. [name], [name] Ladonne Sharples: PA TIENT, NO Other Clinician: Referring Oval Cavazos: Treating Naiomi Musto/Extender: [name] in Treatment: 12 Visit Information History Since Last Visit Added or deleted any medications: No Patient Arrived: Walker Any new allergies or adverse reactions: No Arrival Time: 10:51 Had a fall or experienced change in No Accompanied By: self activities of daily living that may affect Transfer Assistance: None risk of falls: Patient Identification Verified: Yes Signs or symptoms of abuse/neglect since last visito No Secondary Verification Process Completed: Yes Hospitalized since last visit: No Patient Requires Transmission-Based No Implantable device outside of the clinic excluding No Precautions: cellular tissue based products placed in the center Patient Has Alerts: Yes since last visit: Patient Alerts: Patient on Blood Thinner Has Dressing in Place as Prescribed: Yes R ABI=Non compressible Pain Present Now: No Electronic Signature(s) Signed: 06/22/2021 5:12:13 PM By: [name] RN Entered By: [name] on 06/22/2021 10:52:26 -------------------------------------------------------------------------------- Clinic Level of Reese Assessment Details Patient Name: Date of Service: [name], [name] 06/22/2021 10:30 A M Medical Record Number: [id number] Patient Account Number: [id number] Date of Birth/Sex: Treating RN: [birth year] (70 y.o. [name], [name] [name]: PA [name], NO Other Clinician: Referring [name]: Treating [name]/Extender: [name] in Treatment: 12 Clinic Level of Reese  Assessment Items TOOL 4 Quantity Score X- 1 0 Use when only an EandM is performed on FOLLOW-UP visit ASSESSMENTS - Nursing Assessment / Reassessment X- 1 10 Reassessment of Co-morbidities (includes updates in patient status) X- 1 5 Reassessment of Adherence to Treatment Plan ASSESSMENTS - Wound and Skin A ssessment / Reassessment X - Simple Wound Assessment / Reassessment - one wound 1 5 []  - 0 Complex Wound Assessment / Reassessment - multiple wounds []  - 0 Dermatologic / Skin Assessment (not related to wound area) ASSESSMENTS - Focused Assessment []  - 0 Circumferential Edema Measurements - multi extremities []  - 0 Nutritional Assessment / Counseling / Intervention []  - 0 Lower Extremity Assessment (monofilament, tuning fork, pulses) []  - 0 Peripheral Arterial Disease Assessment (using hand held doppler) ASSESSMENTS - Ostomy and/or Continence Assessment and Reese []  - 0 Incontinence Assessment and Management []  - 0 Ostomy Reese Assessment and Management (repouching, etc.) PROCESS - Coordination of Reese X - Simple Patient / Family Education for ongoing Reese 1 15 []  - 0 Complex (extensive) Patient / Family Education for ongoing Reese X- 1 10 Staff obtains Programmer, systems, Records, T Results / Process Orders est []  - 0 Staff telephones HHA, Nursing Homes / Clarify orders / etc []  - 0 Routine Transfer to another Facility (non-emergent condition) []  - 0 Routine Hospital Admission (non-emergent condition) []  - 0 New Admissions / Biomedical engineer / Ordering NPWT Apligraf, etc. , []  - 0 Emergency Hospital Admission (emergent condition) X- 1 10 Simple Discharge Coordination []  - 0 Complex (extensive) Discharge Coordination PROCESS - Special Needs []  - 0 Pediatric / Minor Patient Management []  - 0 Isolation Patient Management []  - 0 Hearing / Language / Visual special needs []  - 0 Assessment of Community assistance (transportation, D/C planning, etc.) []  -  0 Additional assistance / Altered mentation []  - 0 Support Surface(s) Assessment (bed, cushion, seat, etc.) INTERVENTIONS - Wound Cleansing / Measurement X - Simple  Wound Cleansing - one wound 1 5 []  - 0 Complex Wound Cleansing - multiple wounds X- 1 5 Wound Imaging (photographs - any number of wounds) []  - 0 Wound Tracing (instead of photographs) X- 1 5 Simple Wound Measurement - one wound []  - 0 Complex Wound Measurement - multiple wounds INTERVENTIONS - Wound Dressings []  - 0 Small Wound Dressing one or multiple wounds X- 1 15 Medium Wound Dressing one or multiple wounds []  - 0 Large Wound Dressing one or multiple wounds []  - 0 Application of Medications - topical []  - 0 Application of Medications - injection INTERVENTIONS - Miscellaneous []  - 0 External ear exam []  - 0 Specimen Collection (cultures, biopsies, blood, body fluids, etc.) []  - 0 Specimen(s) / Culture(s) sent or taken to Lab for analysis []  - 0 Patient Transfer (multiple staff / Civil Service fast streamer / Similar devices) []  - 0 Simple Staple / Suture removal (25 or less) []  - 0 Complex Staple / Suture removal (26 or more) []  - 0 Hypo / Hyperglycemic Management (close monitor of Blood Glucose) []  - 0 Ankle / Brachial Index (ABI) - do not check if billed separately X- 1 5 Vital Signs Has the patient been seen at the hospital within the last three years: Yes Total Score: 90 Level Of Reese: New/Established - Level 3 Electronic Signature(s) Signed: 06/22/2021 5:12:13 PM By: [name] RN Entered By: [name] on 06/22/2021 10:55:40 -------------------------------------------------------------------------------- Encounter Discharge Information Details Patient Name: Date of Service: [name], [name] 06/22/2021 10:30 A M Medical Record Number: [id number] Patient Account Number: [id number] Date of Birth/Sex: Treating RN: 03/26/1951 (70 y.o. [name] Primary Reese [name]: PA [name], NO  Other Clinician: Referring [name]: Treating [name]/Extender: [name] in Treatment: 12 Encounter Discharge Information Items Discharge Condition: Stable Ambulatory Status: Walker Discharge Destination: Home Transportation: Private Auto Accompanied By: self Schedule Follow-up Appointment: Yes Clinical Summary of Reese: Patient Declined Electronic Signature(s) Signed: 06/22/2021 5:12:13 PM By: [name] RN Entered By: [name] on 06/22/2021 10:55:06 -------------------------------------------------------------------------------- Patient/Caregiver Education Details Patient Name: Date of Service: [name], [name] 7/11/2022andnbsp10:30 [place] Record Number: [id number] Patient Account Number: [id number] Date of Birth/Gender: Treating RN: 12/15/1950 (70 y.o. [name] Primary Reese Physician: PA [name], NO Other Clinician: Referring Physician: Treating Physician/Extender: [name] in Treatment: 12 Education Assessment Education Provided To: Patient Education Topics Provided Venous: Methods: Explain/Verbal Responses: State content correctly Electronic Signature(s) Signed: 06/22/2021 5:12:13 PM By: [name] RN Entered By: [name] on 06/22/2021 10:54:55 -------------------------------------------------------------------------------- Wound Assessment Details Patient Name: Date of Service: [name], [name] 06/22/2021 10:30 A M Medical Record Number: [id number] Patient Account Number: [id number] Date of Birth/Sex: Treating RN: Sep 03, 1951 (70 y.o. [name], [name] Patrik Turnbaugh: PA TIENT, NO Other Clinician: Referring Ercil Cassis: Treating Jenavive Lamboy/Extender: [name] in Treatment: 12 Wound Status Wound Number: 6 Primary Etiology: Diabetic Wound/Ulcer of the Lower Extremity Wound Location: Right, Lateral Lower Leg Wound Status:  Open Wounding Event: Gradually Appeared Date Acquired: 05/22/2021 Reese Of Treatment: 4 Clustered Wound: No Wound Measurements Length: (cm) 0.7 Width: (cm) 0.9 Depth: (cm) 0.1 Area: (cm) 0.495 Volume: (cm) 0.049 % Reduction in Area: 87.4% % Reduction in Volume: 87.5% Wound Description Classification: Grade 2 Treatment Notes Wound #6 (Lower Leg) Wound Laterality: Right, Lateral Cleanser Normal Saline Discharge Instruction: Cleanse the wound with Normal Saline prior to applying a clean dressing using gauze sponges, not tissue or  cotton balls. Soap and Water Discharge Instruction: May shower and wash wound with dial antibacterial soap and water prior to dressing change. Peri-Wound Reese Triamcinolone 15 (g) Discharge Instruction: Use triamcinolone 15 (g) as directed Sween Lotion (Moisturizing lotion) Discharge Instruction: Apply moisturizing lotion as directed Topical Primary Dressing KerraCel Ag Gelling Fiber Dressing, 4x5 in (silver alginate) Discharge Instruction: Apply silver alginate to wound bed as instructed Secondary Dressing Woven Gauze Sponge, Non-Sterile 4x4 in Discharge Instruction: Apply over primary dressing as directed. ABD Pad, 8x10 Discharge Instruction: Apply over primary dressing as directed. Secured With Compression Wrap Kerlix Roll 4.5x3.1 (in/yd) Discharge Instruction: Apply Kerlix and Coban compression as directed. Coban Self-Adherent Wrap 4x5 (in/yd) Discharge Instruction: Apply over Kerlix as directed. Compression Stockings Add-Ons Electronic Signature(s) Signed: 06/22/2021 5:12:13 PM By: [name] RN Entered By: [name] on 06/22/2021 10:54:27 -------------------------------------------------------------------------------- Vitals Details Patient Name: Date of Service: [name], [name] 06/22/2021 10:30 A M Medical Record Number: [id number] Patient Account Number: [id number] Date of Birth/Sex: Treating RN: 09/05/1951 (70  y.o. [name], [name] Matti Killingsworth: PA TIENT, NO Other Clinician: Referring Mayar Whittier: Treating Hassel Uphoff/Extender: [name] in Treatment: 12 Vital Signs Time Taken: 10:52 Temperature (F): 97.8 Pulse (bpm): 84 Respiratory Rate (breaths/min): 17 Blood Pressure (mmHg): 140/84 Capillary Blood Glucose (mg/dl): 72 Reference Range: 80 - 120 mg / dl Electronic Signature(s) Signed: 06/22/2021 5:12:13 PM By: [name] RN Entered By: [name] on 06/22/2021 10:54:07

## 2021-06-23 NOTE — Progress Notes (Signed)
KIRIN, PASTORINO ([id number]) Visit Report for 06/22/2021 SuperBill Details Patient Name: Date of Service: [name], [name] 06/22/2021 Medical Record Number: [id number] Patient Account Number: [id number] Date of Birth/Sex: Treating RN: 04-09-1951 (70 y.o. 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

## 2021-06-29 ENCOUNTER — Encounter (HOSPITAL_BASED_OUTPATIENT_CLINIC_OR_DEPARTMENT_OTHER): Payer: Medicare Other | Admitting: Internal Medicine

## 2021-07-02 ENCOUNTER — Encounter (HOSPITAL_BASED_OUTPATIENT_CLINIC_OR_DEPARTMENT_OTHER): Payer: Medicare Other | Admitting: Internal Medicine

## 2021-07-02 ENCOUNTER — Other Ambulatory Visit: Payer: Self-pay

## 2021-07-02 DIAGNOSIS — L03116 Cellulitis of left lower limb: Secondary | ICD-10-CM

## 2021-07-02 DIAGNOSIS — L97829 Non-pressure chronic ulcer of other part of left lower leg with unspecified severity: Secondary | ICD-10-CM

## 2021-07-02 DIAGNOSIS — L97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 with unspecified severity: Secondary | ICD-10-CM | POA: Diagnosis not present

## 2021-07-02 DIAGNOSIS — I872 Venous insufficiency (chronic) (peripheral): Secondary | ICD-10-CM

## 2021-07-02 DIAGNOSIS — E11622 Type 2 diabetes mellitus with other skin ulcer: Secondary | ICD-10-CM | POA: Diagnosis not present

## 2021-07-02 NOTE — Progress Notes (Signed)
ADALBERTO, METZGAR ([id number]) Visit Report for 07/02/2021 Chief Complaint Document Details Patient Name: Date of Service: [name], [place] [name] 07/02/2021 10:30 A M Medical Record Number: [id number] Patient Account Number: [id number] Date of Birth/Sex: Treating RN: August 29, 1951 (70 y.o. [name] Primary Care Provider: PA [name], NO Other Clinician: Referring Provider: Treating Provider/Extender: [name]: 14 Information Obtained from: Patient Chief Complaint bilateral lower extremity swelling with wounds Electronic Signature(s) Signed: 07/02/2021 12:08:40 PM By: Kalman Shan DO Entered By: Kalman Shan on 07/02/2021 12:03:08 -------------------------------------------------------------------------------- HPI Details Patient Name: Date of Service: [name], Allen [name] 07/02/2021 10:30 A M Medical Record Number: [id number] Patient Account Number: [id number] Date of Birth/Sex: Treating RN: 03-Apr-1951 (70 y.o. [name], [name] Primary Care Provider: PA [name], NO Other Clinician: Referring Provider: Treating Provider/Extender: [name]: 14 History of Present Illness HPI Description: [name] is a 70 year old male with a past medical history of type 2 diabetes and essential hypertension who presents to the clinic today for bilateral lower extremity wounds. He was recently seen in the ED on 3/9 for bilateral lower extremity edema. He had a DVT study that was negative for clots. He was given triamcinolone cream and advised to elevate legs and obtain compression stockings. He has been using the triamcinolone cream however he is unable to use compression stockings due to difficulty of putting on. Patient states that for the past 2 years he has had opening and closing of weeping wounds to his legs bilaterally. They spontaneously open and close. They are often open for several weeks before they heal  spontaneously. He denies any purulent drainage, increased warmth or increased erythema to the skin. 4/21; patient presents for 1 week follow-up. The wounds on the right leg have healed however he has now 2 new wounds 1 on the anterior and posterior thigh that occurred from him scratching underneath the wrap. The left leg wound has improved and almost closed 4/28; patient presents for 1 week follow-up. He continues to have a wound on the right anterior leg that is showing signs of healing. The posterior right thigh has closed. He now has a new wound to the left leg that is limited to skin breakdown. 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 The wounds are occurring because he goes underneath the leg wraps and scratches creating new wounds. He has no complaints today. 5/5; patient presents for 1 week follow-up. He no longer has any open wounds to his lower extremities bilaterally. He continues to have scattered excoriation marks from where he scratches. These are scabbed over. He states he is going to see dermatology this month. Overall he is doing well and is happy with his care. Readmission 6/10 Patient was followed for open wounds limited to skin breakdown to his lower extremities bilaterally 2/2 venous insufficiency. Once the wounds healed he was discharged with juxta lite compressions and these were placed in office. He was discharged on 5/5. He states that he has not taken the wraps off since discharge and has had them on for over a month. He did not want to take them off because they were very comfortable. He does not have any difficulty putting these on and off if he wanted to. He states that he does not take a shower and just cleans the other parts of his body at the sink. He noticed that 2  days ago he was having some drainage at the top of the wrap and called in to be evaluated. He states that he has not seen the wounds  until the wraps were taken off today in office. He denies pain or increased warmth or erythema to the area 6/27; patient presents for 2-week follow-up. He has had increased swelling and drainage to the wounds over the last week. He reports increased erythema to the area. He does not have pain however. He denies purulent drainage or fever/chills. 7/5; patient presents for 1 week follow-up. He was prescribed Keflex at last clinic visit and is currently taking this. He reports significant improvement to his leg swelling, tenderness and erythema. He denies signs of infection. 7/21; patient presents for 2-week follow-up. He reports improvement in healing in his right lower extremity. Unfortunately this morning he noticed skin breakdown to his left lower extremity with increased redness and warmth. Electronic Signature(s) Signed: 07/02/2021 12:08:40 PM By: Kalman Shan DO Entered By: Kalman Shan on 07/02/2021 12:04:06 -------------------------------------------------------------------------------- Physical Exam Details Patient Name: Date of Service: [name], Allen [name] 07/02/2021 10:30 A M Medical Record Number: [id number] Patient Account Number: [id number] Date of Birth/Sex: Treating RN: May 21, 1951 (70 y.o. [name] Primary Care Provider: PA [name], NO Other Clinician: Referring Provider: Treating Provider/Extender: [name]: 14 Constitutional respirations regular, non-labored and within target range for patient.[name] Psychiatric pleasant and cooperative. Notes Right lower extremity with no open wounds Left lower extremity with skin breakdown to the posterior aspect and increased warmth and redness throughout the entire leg. Electronic Signature(s) Signed: 07/02/2021 12:08:40 PM By: Kalman Shan DO Entered By: Kalman Shan on 07/02/2021  12:05:20 -------------------------------------------------------------------------------- Physician Orders Details Patient Name: Date of Service: [name], Allen [name] 07/02/2021 10:30 A M Medical Record Number: [id number] Patient Account Number: [id number] Date of Birth/Sex: Treating RN: 12/31/1950 (70 y.o. [name], [name] Primary Care Provider: PA TIENT, NO Other Clinician: Referring Provider: Treating Provider/Extender: [name] in Treatment: 10 Verbal / Phone Orders: No Diagnosis Coding ICD-10 Coding Code Description L97.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 with unspecified severity L97.829 Non-pressure chronic ulcer of other part of left lower leg with unspecified severity I89.0 Lymphedema, not elsewhere classified I87.2 Venous insufficiency (chronic) (peripheral) L03.115 Cellulitis of right lower limb Follow-up Appointments ppointment in 1 week. - Dr Heber [place]!! Return A Bathing/ Shower/ Hygiene May shower with protection but do not get wound dressing(s) wet. Edema Control - Lymphedema / SCD / Other Elevate legs to the level of the heart or above for 30 minutes daily and/or when sitting, a frequency of: Avoid standing for long periods of time. Patient to wear own compression stockings every day. - Apply juxtalites in the AM and remove before bedtime!! Wound Treatment Wound #7 - Lower Leg Wound Laterality: Left, Posterior Cleanser: Normal Saline (Home Health) 3 x Per Week/7 Days Discharge Instructions: Cleanse the wound with Normal Saline prior to applying a clean dressing using gauze sponges, not tissue or cotton balls. Cleanser: Soap and Water [facility]) 3 x Per Week/7 Days Discharge Instructions: May shower and wash wound with dial antibacterial soap and water prior to dressing change. Prim Dressing: KerraCel Ag Gelling Fiber Dressing, 4x5 in (silver alginate) (Home Health) 3 x Per Week/7 Days ary Discharge  Instructions: Apply silver alginate to wound bed as instructed Secondary Dressing: Woven Gauze Sponge, Non-Sterile 4x4 in (Home Health) 3 x Per Week/7 Days Discharge Instructions:  Apply over primary dressing as directed. Secured With: [employer], 4.5x3.1 (in/yd) (Home Health) 3 x Per Week/7 Days Discharge Instructions: Secure with Kerlix as directed. Secured With: 36M Medipore H Soft Cloth Surgical T 4 x 2 (in/yd) (Home Health) 3 x Per Week/7 Days ape Discharge Instructions: Secure dressing with tape as directed. Compression Stockings: Circaid Juxta Lite Compression Wrap Left Leg Compression Amount: 20-30 mmHG Discharge Instructions: Apply Circaid Juxta Lite Compression Wrap daily as instructed. Apply first thing in the morning, remove at night before bed. Electronic Signature(s) Signed: 07/02/2021 2:06:34 PM By: Kalman Shan DO Signed: 07/02/2021 7:48:44 PM By: [name] RN Previous Signature: 07/02/2021 12:08:40 PM Version By: Kalman Shan DO Entered By: [name] on 07/02/2021 12:13:34 -------------------------------------------------------------------------------- Problem List Details Patient Name: Date of Service: [name], Allen [name] 07/02/2021 10:30 A M Medical Record Number: [id number] Patient Account Number: [id number] Date of Birth/Sex: Treating RN: September 25, 1951 (70 y.o. 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 with unspecified 03/26/2021 No Yes severity L97.829 Non-pressure chronic ulcer of other part of left lower leg with unspecified 03/26/2021 No Yes severity I89.0 Lymphedema, not elsewhere classified 03/26/2021 No Yes I87.2 Venous insufficiency (chronic) (peripheral)  03/26/2021 No Yes L03.115 Cellulitis of right lower limb 06/08/2021 No Yes Inactive Problems Resolved Problems Electronic Signature(s) Signed: 07/02/2021 12:08:40 PM By: Kalman Shan DO Entered By: Kalman Shan on 07/02/2021 12:02:40 -------------------------------------------------------------------------------- Progress Note Details Patient Name: Date of Service: [name], Allen [name] 07/02/2021 10:30 A M Medical Record Number: [id number] Patient Account Number: [id number] Date of Birth/Sex: Treating RN: 1951-12-11 (70 y.o. [name] Primary Care Provider: PA [name], NO Other Clinician: Referring Provider: Treating Provider/Extender: [name]: 14 Subjective Chief Complaint Information obtained from Patient bilateral lower extremity swelling with wounds History of Present Illness (HPI) Roark Rufo is a 70 year old male with a past medical history of type 2 diabetes and essential hypertension who presents to the clinic today for bilateral lower extremity wounds. He was recently seen in the ED on 3/9 for bilateral lower extremity edema. He had a DVT study that was negative for clots. He was given triamcinolone cream and advised to elevate legs and obtain compression stockings. He has been using the triamcinolone cream however he is unable to use compression stockings due to difficulty of putting on. Patient states that for the past 2 years he has had opening and closing of weeping wounds to his legs bilaterally. They spontaneously open and close. They are often open for several weeks before they heal spontaneously. He denies any purulent drainage, increased warmth or increased erythema to the skin. 4/21; patient presents for 1 week follow-up. The wounds on the right leg have healed however he has now 2 new wounds 1 on the anterior and posterior thigh that occurred from him scratching underneath the wrap. The left leg wound has  improved and almost closed 4/28; patient presents for 1 week follow-up. He continues to have a wound on the right anterior leg that is showing signs of healing. The posterior right thigh has closed. He now has a new wound to the left leg that is limited to skin breakdown. 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 The wounds are occurring because he goes underneath the leg wraps and scratches creating new wounds. He has no complaints today. 5/5; patient presents for 1 week follow-up. He no longer has any open wounds to his lower extremities bilaterally. He continues to have scattered excoriation marks from where he scratches. These are scabbed over. He states he is going to see dermatology this month. Overall he is doing well and is happy with his care. Readmission 6/10 Patient was followed for open wounds limited to skin breakdown to his lower extremities bilaterally 2/2 venous insufficiency. Once the wounds healed he was discharged with juxta lite compressions and these were placed in office. He was discharged on 5/5. He states that he has not taken the wraps off since discharge and has had them on for over a month. He did not want to take them off because they were very comfortable. He does not have any difficulty putting these on and off if he wanted to. He states that he does not take a shower and just cleans the other parts of his body at the sink. He noticed that 2 days ago he was having some drainage at the top of the wrap and called in to be evaluated. He states that he has not seen the wounds until the wraps were taken off today in office. He denies pain or increased warmth or erythema to the area 6/27; patient presents for 2-week follow-up. He has had increased swelling and drainage to the wounds over the last week. He reports increased erythema to the area. He does not have pain however. He denies purulent drainage or  fever/chills. 7/5; patient presents for 1 week follow-up. He was prescribed Keflex at last clinic visit and is currently taking this. He reports significant improvement to his leg swelling, tenderness and erythema. He denies signs of infection. 7/21; patient presents for 2-week follow-up. He reports improvement in healing in his right lower extremity. Unfortunately this morning he noticed skin breakdown to his left lower extremity with increased redness and warmth. Patient History Unable to Obtain Patient History due to Altered Mental Status. Information obtained from Patient. Family History Cancer - Mother,Maternal Grandparents, Heart Disease - Maternal Grandparents, Hypertension - Maternal Grandparents, No family history of Diabetes, Hereditary Spherocytosis. Social History Former smoker - ended on 09/14/1983, Alcohol Use - Rarely, Drug Use - No History, Caffeine Use - Never. Medical History Eyes Patient has history of Cataracts - 2014 Cardiovascular Patient has history of Arrhythmia - A-Fib Endocrine Patient has history of Type II Diabetes Musculoskeletal Patient has history of Osteoarthritis Medical A Surgical History Notes nd Hematologic/Lymphatic Hyperbilirubinemia Gastrointestinal Rectal Pain-Constipation Genitourinary CKD stage 3-BPH Oncologic Colon Cancer-Colon Re-section Objective Constitutional respirations regular, non-labored and within target range for patient.. Vitals Time Taken: 11:03 AM, Temperature: 98.1 F, Pulse: 61 bpm, Respiratory Rate: 17 breaths/min, Blood Pressure: 121/63 mmHg, Capillary Blood Glucose: 82 mg/dl. Psychiatric pleasant and cooperative. General Notes: Right lower extremity with no open wounds Left lower extremity with skin breakdown to the posterior aspect and increased warmth and redness throughout the entire leg. Integumentary (Hair, Skin) Wound #6 status is Healed - Epithelialized. Original cause of wound was Gradually Appeared. The  date acquired was: 05/22/2021. The wound has been in treatment 5 weeks. The wound is located on the Right,Lateral Lower Leg. The wound measures 0cm length x 0cm width x 0cm depth; 0cm^2 area and 0cm^3 volume. Wound #7 status is Open. Original cause of wound was Gradually Appeared. The date acquired was: 07/02/2021.  The wound is located on the Left,Posterior Lower Leg. The wound measures 7cm length x 2.5cm width x 0.1cm depth; 13.744cm^2 area and 1.374cm^3 volume. 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 We will apply juxta light compression to this. Unfortunately his left lower extremity has skin breakdown with erythema and increased warmth throughout. I will prescribe Keflex for this And use silver alginate for the wound bed. I am not quite sure why patient continues to have areas of skin breakdown with the use of compression wraps. We will try and investigate this further at next clinic visit. Plan 1. Keflex 2. Silver alginate 3. Juxta light compression bilaterally Electronic Signature(s) Signed: 07/02/2021 12:08:40 PM By: Kalman Shan DO Entered By: Kalman Shan on 07/02/2021 12:07:43 -------------------------------------------------------------------------------- HxROS Details Patient Name: Date of Service: [name], Allen [name] 07/02/2021 10:30 A M Medical Record Number: [id number] Patient Account Number: [id number] Date of Birth/Sex: Treating RN: [birth year] (70 y.o. [name], [name] Primary Care Provider: PA [name], NO Other Clinician: Referring Provider: Treating Provider/Extender: [name]  in Treatment: 23 Unable to Obtain Patient History due to Altered Mental Status Information Obtained From Patient Eyes Medical History: Positive for: Cataracts - 2014 Hematologic/Lymphatic Medical History: Past Medical History Notes: Hyperbilirubinemia Cardiovascular Medical History: Positive for: Arrhythmia - A-Fib Gastrointestinal Medical History: Past Medical History Notes: Rectal Pain-Constipation Endocrine Medical History: Positive for: Type II Diabetes Time with diabetes: Since 2014 Treated with: Oral agents Blood sugar tested every day: Yes Tested : Genitourinary Medical History: Past Medical History Notes: CKD stage 3-BPH Musculoskeletal Medical History: Positive for: Osteoarthritis Oncologic Medical History: Past Medical History Notes: Colon Cancer-Colon Re-section HBO Extended History Items Eyes: Cataracts Immunizations Pneumococcal Vaccine: Received Pneumococcal Vaccination: No Implantable Devices None Family and Social History Cancer: Yes - Mother,Maternal Grandparents; Diabetes: No; Heart Disease: Yes - Maternal Grandparents; Hereditary Spherocytosis: No; Hypertension: Yes - Maternal Grandparents; Former smoker - ended on 09/14/1983; Alcohol Use: Rarely; Drug Use: No History; Caffeine Use: Never; Financial Concerns: No; Food, Clothing or Shelter Needs: No; Support System Lacking: No; Transportation Concerns: No Electronic Signature(s) Signed: 07/02/2021 12:08:40 PM By: Kalman Shan DO Signed: 07/02/2021 7:48:44 PM By: [name] RN Entered By: Kalman Shan on 07/02/2021 12:04:12 -------------------------------------------------------------------------------- SuperBill Details Patient Name: Date of Service: [name], Allen [name] 07/02/2021 Medical Record Number: [id number] Patient Account Number: [id number] Date of Birth/Sex: Treating RN: 1951/02/01 (70 y.o. [name], [name] Primary Care Provider: PA TIENT, NO Other  Clinician: Referring Provider: Treating Provider/Extender: [name]: 14 Diagnosis Coding ICD-10 Codes Code Description L97.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 with unspecified severity L97.829 Non-pressure chronic ulcer of other part of left lower leg with unspecified severity I89.0 Lymphedema, not elsewhere classified I87.2 Venous insufficiency (chronic) (peripheral) L03.116 Cellulitis of left lower limb Facility Procedures CPT4 Code: [id number] Description: 99213 - WOUND CARE VISIT-LEV 3 EST PT Modifier: Quantity: 1 Physician Procedures : CPT4 Code Description Modifier [id number] - WC PHYS LEVEL 3 - EST PT ICD-10 Diagnosis Description L97.829 Non-pressure chronic ulcer of other part of left lower leg with unspecified severity L97.819 Non-pressure chronic ulcer of other part of right  lower leg  with unspecified severity I87.2 Venous insufficiency (chronic) (peripheral) L03.116 Cellulitis of left lower limb Quantity: 1 Electronic Signature(s) Signed: 07/02/2021 2:06:34 PM By: Kalman Shan DO Signed: 07/02/2021 7:48:44 PM By: [name] RN Previous Signature: 07/02/2021 12:08:40 PM Version By: Kalman Shan DO Entered By: [name] on 07/02/2021 12:18:29

## 2021-07-02 NOTE — Progress Notes (Signed)
SHUAN, STATZER ([id number]) Visit Report for 06/08/2021 Arrival Information Details Patient Name: Date of Service: [name] 06/08/2021 10:15 A M Medical Record Number: [id number] Patient Account Number: [id number] Date of Birth/Sex: Treating RN: 04/05/1951 (71 y.o. [name], Meta.Reding Primary Care [name]: PA [name], NO Other Clinician: Referring [name]: Treating [name]/Extender: [name] in Treatment: 10 Visit Information History Since Last Visit All ordered tests and consults were completed: No Patient Arrived: [name] Added or deleted any medications: No Arrival Time: 11:03 Any new allergies or adverse reactions: No Accompanied By: alone Had a fall or experienced change in No Transfer Assistance: None activities of daily living that may affect Patient Identification Verified: Yes risk of falls: Secondary Verification Process Completed: Yes Signs or symptoms of abuse/neglect since last visito No Patient Requires Transmission-Based Precautions: No Hospitalized since last visit: No Patient Has Alerts: Yes Implantable device outside of the clinic excluding No Patient Alerts: Patient on Blood Thinner cellular tissue based products placed in the center R ABI=Non compressible since last visit: Has Dressing in Place as Prescribed: Yes Has Compression in Place as Prescribed: Yes Pain Present Now: No Electronic Signature(s) Signed: 07/01/2021 8:12:01 PM By: [name] Previous Signature: 06/29/2021 8:59:41 PM Version By: [name] Entered By: [name] on 07/01/2021 20:10:46 -------------------------------------------------------------------------------- Clinic Level of Care Assessment Details Patient Name: Date of Service: [name], [name] 06/08/2021 10:15 A M Medical Record Number: [id number] Patient Account Number: [id number] Date of Birth/Sex: Treating RN: 1951-09-23 (70 y.o. [name] Primary Care Rin Gorton: PA [name], NO  Other Clinician: Referring Ceylon Arenson: Treating Ceciley Buist/Extender: [name] in Treatment: 10 Clinic Level of Care Assessment Items TOOL 4 Quantity Score X- 1 0 Use when only an EandM is performed on FOLLOW-UP visit ASSESSMENTS - Nursing Assessment / Reassessment X- 1 10 Reassessment of Co-morbidities (includes updates in patient status) X- 1 5 Reassessment of Adherence to Treatment Plan ASSESSMENTS - Wound and Skin A ssessment / Reassessment X - Simple Wound Assessment / Reassessment - one wound 1 5 []  - 0 Complex Wound Assessment / Reassessment - multiple wounds []  - 0 Dermatologic / Skin Assessment (not related to wound area) ASSESSMENTS - Focused Assessment []  - 0 Circumferential Edema Measurements - multi extremities []  - 0 Nutritional Assessment / Counseling / Intervention []  - 0 Lower Extremity Assessment (monofilament, tuning fork, pulses) []  - 0 Peripheral Arterial Disease Assessment (using hand held doppler) ASSESSMENTS - Ostomy and/or Continence Assessment and Care []  - 0 Incontinence Assessment and Management []  - 0 Ostomy Care Assessment and Management (repouching, etc.) PROCESS - Coordination of Care []  - 0 Simple Patient / Family Education for ongoing care X- 1 20 Complex (extensive) Patient / Family Education for ongoing care X- 1 10 Staff obtains Programmer, systems, Records, T Results / Process Orders est X- 1 10 Staff telephones HHA, Nursing Homes / Clarify orders / etc []  - 0 Routine Transfer to another Facility (non-emergent condition) []  - 0 Routine Hospital Admission (non-emergent condition) []  - 0 New Admissions / Biomedical engineer / Ordering NPWT Apligraf, etc. , []  - 0 Emergency Hospital Admission (emergent condition) []  - 0 Simple Discharge Coordination []  - 0 Complex (extensive) Discharge Coordination PROCESS - Special Needs []  - 0 Pediatric / Minor Patient Management []  - 0 Isolation Patient Management []  -  0 Hearing / Language / Visual special needs []  - 0 Assessment of Community assistance (transportation, D/C planning, etc.) []  - 0 Additional assistance /  Altered mentation []  - 0 Support Surface(s) Assessment (bed, cushion, seat, etc.) INTERVENTIONS - Wound Cleansing / Measurement X - Simple Wound Cleansing - one wound 1 5 []  - 0 Complex Wound Cleansing - multiple wounds X- 1 5 Wound Imaging (photographs - any number of wounds) []  - 0 Wound Tracing (instead of photographs) X- 1 5 Simple Wound Measurement - one wound []  - 0 Complex Wound Measurement - multiple wounds INTERVENTIONS - Wound Dressings []  - 0 Small Wound Dressing one or multiple wounds []  - 0 Medium Wound Dressing one or multiple wounds X- 1 20 Large Wound Dressing one or multiple wounds []  - 0 Application of Medications - topical []  - 0 Application of Medications - injection INTERVENTIONS - Miscellaneous []  - 0 External ear exam []  - 0 Specimen Collection (cultures, biopsies, blood, body fluids, etc.) []  - 0 Specimen(s) / Culture(s) sent or taken to Lab for analysis []  - 0 Patient Transfer (multiple staff / Civil Service fast streamer / Similar devices) []  - 0 Simple Staple / Suture removal (25 or less) []  - 0 Complex Staple / Suture removal (26 or more) []  - 0 Hypo / Hyperglycemic Management (close monitor of Blood Glucose) []  - 0 Ankle / Brachial Index (ABI) - do not check if billed separately X- 1 5 Vital Signs Has the patient been seen at the hospital within the last three years: Yes Total Score: 100 Level Of Care: New/Established - Level 3 Electronic Signature(s) Signed: 06/08/2021 5:08:20 PM By: [name] Entered By: [name] on 06/08/2021 11:50:48 -------------------------------------------------------------------------------- Encounter Discharge Information Details Patient Name: Date of Service: [name], [name] 06/08/2021 10:15 A M Medical Record Number: [id number] Patient Account Number:  [id number] Date of Birth/Sex: Treating RN: 05-22-1951 (70 y.o. [name] Primary Care [name]: PA [name], NO Other Clinician: Referring Delberta Folts: Treating Lynell Greenhouse/Extender: [name] in Treatment: 10 Encounter Discharge Information Items Discharge Condition: Stable Ambulatory Status: Walker Discharge Destination: Home Transportation: Private Auto Accompanied By: alone Schedule Follow-up Appointment: No Clinical Summary of Care: Patient Declined Electronic Signature(s) Signed: 07/01/2021 8:12:01 PM By: [name] Previous Signature: 06/29/2021 8:59:41 PM Version By: [name] Entered By: [name] on 07/01/2021 20:11:49 -------------------------------------------------------------------------------- Lower Extremity Assessment Details Patient Name: Date of Service: [name], [name] 06/08/2021 10:15 A M Medical Record Number: [id number] Patient Account Number: [id number] Date of Birth/Sex: Treating RN: June 29, 1951 (70 y.o. [name] Primary Care Jarian Longoria: PA [name], NO Other Clinician: Referring Caylah Plouff: Treating Crystle Carelli/Extender: [name] in Treatment: 10 Edema Assessment Assessed: [Left: No] [Right: No] Edema: [Left: Ye] [Right: s] Calf Left: Right: Point of Measurement: 39 cm From Medial Instep 41 cm Ankle Left: Right: Point of Measurement: 13 cm From Medial Instep 29 cm Vascular Assessment Pulses: Dorsalis Pedis Palpable: [Right:Yes] Electronic Signature(s) Signed: 07/01/2021 8:12:01 PM By: [name] Previous Signature: 06/29/2021 8:59:41 PM Version By: [name] Entered By: [name] on 07/01/2021 20:11:07 -------------------------------------------------------------------------------- Multi Wound Chart Details Patient Name: Date of Service: [name], [name] 06/08/2021 10:15 A M Medical Record Number: [id number] Patient Account Number: [id number] Date of Birth/Sex: Treating  RN: 04-05-1951 (70 y.o. [name] Primary Care Therin Vetsch: PA [name], NO Other Clinician: Referring Monzerrat Wellen: Treating Nanna Ertle/Extender: [name] in Treatment: 10 Vital Signs Height(in): Pulse(bpm): 56 Weight(lbs): Blood Pressure(mmHg): 115/66 Body Mass Index(BMI): Temperature(F): 98.2 Respiratory Rate(breaths/min): 18 Photos: [N/A:N/A] Right, Lateral Lower Leg N/A N/A Wound Location: Gradually Appeared N/A N/A Wounding Event:  Diabetic Wound/Ulcer of the Lower N/A N/A Primary Etiology: Extremity Cataracts, Arrhythmia, Type II N/A N/A Comorbid History: Diabetes, Osteoarthritis 05/22/2021 N/A N/A Date Acquired: 2 N/A N/A Reese of Treatment: Open N/A N/A Wound Status: 4.5x8.3x0.1 N/A N/A Measurements L x W x D (cm) 29.335 N/A N/A A (cm) : rea 2.933 N/A N/A Volume (cm) : -647.00% N/A N/A % Reduction in A rea: -646.30% N/A N/A % Reduction in Volume: Grade 2 N/A N/A Classification: Large N/A N/A Exudate A mount: Serosanguineous N/A N/A Exudate Type: red, brown N/A N/A Exudate Color: Distinct, outline attached N/A N/A Wound Margin: Large (67-100%) N/A N/A Granulation A mount: Red N/A N/A Granulation Quality: Small (1-33%) N/A N/A Necrotic A mount: Fat Layer (Subcutaneous Tissue): Yes N/A N/A Exposed Structures: Fascia: No Tendon: No Muscle: No Joint: No Bone: No Small (1-33%) N/A N/A Epithelialization: Treatment Notes Wound #6 (Lower Leg) Wound Laterality: Right, Lateral Cleanser Normal Saline Discharge Instruction: Cleanse the wound with Normal Saline prior to applying a clean dressing using gauze sponges, not tissue or cotton balls. Soap and Water Discharge Instruction: May shower and wash wound with dial antibacterial soap and water prior to dressing change. Peri-Wound Care Triamcinolone 15 (g) Discharge Instruction: Use triamcinolone 15 (g) as directed Sween Lotion (Moisturizing lotion) Discharge  Instruction: Apply moisturizing lotion as directed Topical Primary Dressing KerraCel Ag Gelling Fiber Dressing, 4x5 in (silver alginate) Discharge Instruction: Apply silver alginate to wound bed as instructed Secondary Dressing Woven Gauze Sponge, Non-Sterile 4x4 in Discharge Instruction: Apply over primary dressing as directed. ABD Pad, 8x10 Discharge Instruction: Apply over primary dressing as directed. Secured With Compression Wrap Kerlix Roll 4.5x3.1 (in/yd) Discharge Instruction: Apply Kerlix and Coban compression as directed. Coban Self-Adherent Wrap 4x5 (in/yd) Discharge Instruction: Apply over Kerlix as directed. Compression Stockings Add-Ons Electronic Signature(s) Signed: 06/08/2021 5:08:20 PM By: [name] Signed: 06/08/2021 5:09:42 PM By: Kalman Shan DO Entered By: Kalman Shan on 06/08/2021 17:02:32 -------------------------------------------------------------------------------- Multi-Disciplinary Care Plan Details Patient Name: Date of Service: [name], [name] 06/08/2021 10:15 A M Medical Record Number: [id number] Patient Account Number: [id number] Date of Birth/Sex: Treating RN: 08-13-1951 (70 y.o. [name] Primary Care Melynda Krzywicki: PA [name], NO Other Clinician: Referring Adlee Paar: Treating Prisilla Kocsis/Extender: [name] in Treatment: 10 Active Inactive Venous Leg Ulcer Nursing Diagnoses: Actual venous Insuffiency (use after diagnosis is confirmed) Goals: Patient will maintain optimal edema control Date Initiated: 05/22/2021 Target Resolution Date: 06/19/2021 Goal Status: Active Interventions: Assess peripheral edema status every visit. Compression as ordered Provide education on venous insufficiency Treatment Activities: Therapeutic compression applied : 05/22/2021 Notes: Wound/Skin Impairment Nursing Diagnoses: Impaired tissue integrity Knowledge deficit related to ulceration/compromised skin  integrity Goals: Patient/caregiver will verbalize understanding of skin care regimen Date Initiated: 03/26/2021 Target Resolution Date: 06/19/2021 Goal Status: Active Ulcer/skin breakdown will have a volume reduction of 30% by week 4 Date Initiated: 03/26/2021 Target Resolution Date: 06/19/2021 Goal Status: Active Interventions: Assess patient/caregiver ability to obtain necessary supplies Assess patient/caregiver ability to perform ulcer/skin care regimen upon admission and as needed Assess ulceration(s) every visit Provide education on ulcer and skin care Notes: Electronic Signature(s) Signed: 06/08/2021 11:27:18 AM By: [name] Entered By: [name] on 06/08/2021 11:27:18 -------------------------------------------------------------------------------- Pain Assessment Details Patient Name: Date of Service: [name], [name] 06/08/2021 10:15 A M Medical Record Number: [id number] Patient Account Number: [id number] Date of Birth/Sex: Treating RN: [birth year] (70 y.o. [name] Primary Care Tanis Burnley: PA [name], NO Other Clinician: Referring Mashonda Broski: Treating Denette Hass/Extender:  Kalman Shan Joy, Shawn Reese in Treatment: 10 Active Problems Location of Pain Severity and Description of Pain Patient Has Paino Yes Site Locations With Dressing Change: No Rate the pain. Current Pain Level: 2 Worst Pain Level: 2 Least Pain Level: 0 Tolerable Pain Level: 5 Character of Pain Describe the Pain: Aching, Sharp Pain Management and Medication Current Pain Management: Medication: Yes Cold Application: No Rest: Yes Massage: No Activity: No T.E.N.S.: No Heat Application: No Leg drop or elevation: No Is the Current Pain Management Adequate: Inadequate How does your wound impact your activities of daily livingo Sleep: No Bathing: No Appetite: No Relationship With Others: No Bladder Continence: No Emotions: No Bowel Continence: No Work: No Toileting: No Drive:  No Dressing: No Hobbies: No Notes c/o more pain in wounds this week Electronic Signature(s) Signed: 07/01/2021 8:12:01 PM By: [name] Previous Signature: 06/29/2021 8:59:41 PM Version By: [name] Entered By: [name] on 07/01/2021 20:11:01 -------------------------------------------------------------------------------- Patient/Caregiver Education Details Patient Name: Date of Service: [name], [name] 6/27/2022andnbsp10:15 A M Medical Record Number: [id number] Patient Account Number: [id number] Date of Birth/Gender: Treating RN: Apr 21, 1951 (70 y.o. [name] Primary Care Physician: PA [name], NO Other Clinician: Referring Physician: Treating Physician/Extender: [name] in Treatment: 10 Education Assessment Education Provided To: Patient and Caregiver Education Topics Provided Venous: Methods: Explain/Verbal, Printed Responses: State content correctly Wound/Skin Impairment: Methods: Explain/Verbal, Printed Responses: State content correctly Electronic Signature(s) Signed: 06/08/2021 5:08:20 PM By: [name] Entered By: [name] on 06/08/2021 11:27:35 -------------------------------------------------------------------------------- Wound Assessment Details Patient Name: Date of Service: [name], [name] 06/08/2021 10:15 A M Medical Record Number: [id number] Patient Account Number: [id number] Date of Birth/Sex: Treating RN: [birth year] (70 y.o. [name] Primary Care Liannah Yarbough: PA TIENT, NO Other Clinician: Referring Jefrey Raburn: Treating Kafi Dotter/Extender: [name] in Treatment: 10 Wound Status Wound Number: 6 Primary Etiology: Diabetic Wound/Ulcer of the Lower Extremity Wound Location: Right, Lateral Lower Leg Wound Status: Open Wounding Event: Gradually Appeared Comorbid History: Cataracts, Arrhythmia, Type II Diabetes, Osteoarthritis Date Acquired: 05/22/2021 Reese Of  Treatment: 2 Clustered Wound: No Photos Wound Measurements Length: (cm) 4.5 Width: (cm) 8.3 Depth: (cm) 0.1 Area: (cm) 29.335 Volume: (cm) 2.933 % Reduction in Area: -647% % Reduction in Volume: -646.3% Epithelialization: Small (1-33%) Tunneling: No Undermining: No Wound Description Classification: Grade 2 Wound Margin: Distinct, outline attached Exudate Amount: Large Exudate Type: Serosanguineous Exudate Color: red, brown Foul Odor After Cleansing: No Slough/Fibrino No Wound Bed Granulation Amount: Large (67-100%) Exposed Structure Granulation Quality: Red Fascia Exposed: No Necrotic Amount: Small (1-33%) Fat Layer (Subcutaneous Tissue) Exposed: Yes Tendon Exposed: No Muscle Exposed: No Joint Exposed: No Bone Exposed: No Treatment Notes Wound #6 (Lower Leg) Wound Laterality: Right, Lateral Cleanser Normal Saline Discharge Instruction: Cleanse the wound with Normal Saline prior to applying a clean dressing using gauze sponges, not tissue or cotton balls. Soap and Water Discharge Instruction: May shower and wash wound with dial antibacterial soap and water prior to dressing change. Peri-Wound Care Triamcinolone 15 (g) Discharge Instruction: Use triamcinolone 15 (g) as directed Sween Lotion (Moisturizing lotion) Discharge Instruction: Apply moisturizing lotion as directed Topical Primary Dressing KerraCel Ag Gelling Fiber Dressing, 4x5 in (silver alginate) Discharge Instruction: Apply silver alginate to wound bed as instructed Secondary Dressing Woven Gauze Sponge, Non-Sterile 4x4 in Discharge Instruction: Apply over primary dressing as directed. ABD Pad, 8x10 Discharge Instruction: Apply over primary dressing as directed. Secured With Compression Wrap Kerlix Roll 4.5x3.1 (in/yd)  Discharge Instruction: Apply Kerlix and Coban compression as directed. Coban Self-Adherent Wrap 4x5 (in/yd) Discharge Instruction: Apply over Kerlix as directed. Compression  Stockings Add-Ons Electronic Signature(s) Signed: 07/01/2021 8:12:01 PM By: [name] Previous Signature: 06/08/2021 4:13:19 PM Version By: [name] Previous Signature: 06/29/2021 8:59:41 PM Version By: [name] Entered By: [name] on 07/01/2021 20:11:17 -------------------------------------------------------------------------------- Vitals Details Patient Name: Date of Service: [name], [name] 06/08/2021 10:15 A M Medical Record Number: [id number] Patient Account Number: [id number] Date of Birth/Sex: Treating RN: May 02, 1951 (70 y.o. [name] Primary Care Raylie Maddison: PA TIENT, NO Other Clinician: Referring Mansa Willers: Treating Leani Myron/Extender: [name] in Treatment: 10 Vital Signs Time Taken: 11:04 Temperature (F): 98.2 Pulse (bpm): 56 Respiratory Rate (breaths/min): 18 Blood Pressure (mmHg): 115/66 Reference Range: 80 - 120 mg / dl Airway Pulse Oximetry (%): 95 Electronic Signature(s) Signed: 07/01/2021 8:12:01 PM By: [name] Previous Signature: 06/29/2021 8:59:41 PM Version By: [name] Previous Signature: 06/29/2021 8:59:41 PM Version By: [name] Entered By: [name] on 07/01/2021 20:10:52

## 2021-07-03 NOTE — Progress Notes (Signed)
ELIESER, [name] ([id number]) Visit Report for 07/02/2021 Arrival Information Details Patient Name: Date of Service: [name] 07/02/2021 10:30 A M Medical Record Number: [id number] Patient Account Number: [id number] Date of Birth/Sex: Treating RN: [birth year] (70 y.o. [name], [name] Primary Care [name]: PA TIENT, NO Other Clinician: Referring [name]: Treating [name]/Extender: [name] in Treatment: 14 Visit Information History Since Last Visit Added or deleted any medications: No Patient Arrived: Walker Any new allergies or adverse reactions: No Arrival Time: 11:01 Had a fall or experienced change in No Accompanied By: self activities of daily living that may affect Transfer Assistance: None risk of falls: Patient Identification Verified: Yes Signs or symptoms of abuse/neglect since last visito No Secondary Verification Process Completed: Yes Hospitalized since last visit: No Patient Requires Transmission-Based Precautions: No Implantable device outside of the clinic excluding No Patient Has Alerts: Yes cellular tissue based products placed in the center Patient Alerts: Patient on Blood Thinner since last visit: R ABI=Non compressible Has Dressing in Place as Prescribed: Yes Pain Present Now: No Electronic Signature(s) Signed: 07/03/2021 10:49:45 AM By: [name] Entered By: [name] on 07/02/2021 11:03:21 -------------------------------------------------------------------------------- Clinic Level of Care Assessment Details Patient Name: Date of Service: [name] Allen L. 07/02/2021 10:30 A M Medical Record Number: [id number] Patient Account Number: [id number] Date of Birth/Sex: Treating RN: [name] (70 y.o. [name], [name] Primary Care Rikita Grabert: PA TIENT, NO Other Clinician: Referring Delenn Ahn: Treating [name]/Extender: [name] in Treatment: 14 Clinic Level of Care  Assessment Items TOOL 4 Quantity Score X- 1 0 Use when only an EandM is performed on FOLLOW-UP visit ASSESSMENTS - Nursing Assessment / Reassessment X- 1 10 Reassessment of Co-morbidities (includes updates in patient status) X- 1 5 Reassessment of Adherence to Treatment Plan ASSESSMENTS - Wound and Skin A ssessment / Reassessment X - Simple Wound Assessment / Reassessment - one wound 1 5 '[]'$  - 0 Complex Wound Assessment / Reassessment - multiple wounds X- 1 10 Dermatologic / Skin Assessment (not related to wound area) ASSESSMENTS - Focused Assessment '[]'$  - 0 Circumferential Edema Measurements - multi extremities '[]'$  - 0 Nutritional Assessment / Counseling / Intervention '[]'$  - 0 Lower Extremity Assessment (monofilament, tuning fork, pulses) '[]'$  - 0 Peripheral Arterial Disease Assessment (using hand held doppler) ASSESSMENTS - Ostomy and/or Continence Assessment and Care '[]'$  - 0 Incontinence Assessment and Management '[]'$  - 0 Ostomy Care Assessment and Management (repouching, etc.) PROCESS - Coordination of Care X - Simple Patient / Family Education for ongoing care 1 15 '[]'$  - 0 Complex (extensive) Patient / Family Education for ongoing care X- 1 10 Staff obtains Programmer, systems, Records, T Results / Process Orders est '[]'$  - 0 Staff telephones HHA, Nursing Homes / Clarify orders / etc '[]'$  - 0 Routine Transfer to another Facility (non-emergent condition) '[]'$  - 0 Routine Hospital Admission (non-emergent condition) '[]'$  - 0 New Admissions / Biomedical engineer / Ordering NPWT Apligraf, etc. , '[]'$  - 0 Emergency Hospital Admission (emergent condition) X- 1 10 Simple Discharge Coordination '[]'$  - 0 Complex (extensive) Discharge Coordination PROCESS - Special Needs '[]'$  - 0 Pediatric / Minor Patient Management '[]'$  - 0 Isolation Patient Management '[]'$  - 0 Hearing / Language / Visual special needs '[]'$  - 0 Assessment of Community assistance (transportation, D/C planning, etc.) '[]'$  -  0 Additional assistance / Altered mentation '[]'$  - 0 Support Surface(s) Assessment (bed, cushion, seat, etc.) INTERVENTIONS - Wound Cleansing / Measurement X - Simple Wound  Cleansing - one wound 1 5 '[]'$  - 0 Complex Wound Cleansing - multiple wounds X- 1 5 Wound Imaging (photographs - any number of wounds) '[]'$  - 0 Wound Tracing (instead of photographs) X- 1 5 Simple Wound Measurement - one wound '[]'$  - 0 Complex Wound Measurement - multiple wounds INTERVENTIONS - Wound Dressings X - Small Wound Dressing one or multiple wounds 1 10 '[]'$  - 0 Medium Wound Dressing one or multiple wounds '[]'$  - 0 Large Wound Dressing one or multiple wounds '[]'$  - 0 Application of Medications - topical '[]'$  - 0 Application of Medications - injection INTERVENTIONS - Miscellaneous '[]'$  - 0 External ear exam '[]'$  - 0 Specimen Collection (cultures, biopsies, blood, body fluids, etc.) '[]'$  - 0 Specimen(s) / Culture(s) sent or taken to Lab for analysis '[]'$  - 0 Patient Transfer (multiple staff / Civil Service fast streamer / Similar devices) '[]'$  - 0 Simple Staple / Suture removal (25 or less) '[]'$  - 0 Complex Staple / Suture removal (26 or more) '[]'$  - 0 Hypo / Hyperglycemic Management (close monitor of Blood Glucose) '[]'$  - 0 Ankle / Brachial Index (ABI) - do not check if billed separately X- 1 5 Vital Signs Has the patient been seen at the hospital within the last three years: Yes Total Score: 95 Level Of Care: New/Established - Level 3 Electronic Signature(s) Signed: 07/02/2021 7:48:44 PM By: Allen Hammock RN Entered By: [name] on 07/02/2021 12:18:18 -------------------------------------------------------------------------------- Encounter Discharge Information Details Patient Name: Date of Service: [name], Allen Allen L. 07/02/2021 10:30 A M Medical Record Number: [id number] Patient Account Number: [id number] Date of Birth/Sex: Treating RN: [birth year] (70 y.o. [name] Primary Care Kensington Rios: PA [name], NO Other  Clinician: Referring Keniesha Adderly: Treating Lylie Blacklock/Extender: [name] in Treatment: 14 Encounter Discharge Information Items Discharge Condition: Stable Ambulatory Status: Walker Discharge Destination: [place] Orders Sent: Yes Transportation: Other Schedule Follow-up Appointment: Yes Clinical Summary of Care: Provided on 07/02/2021 Form Type Recipient Paper Patient Patient Electronic Signature(s) Signed: 07/02/2021 12:43:54 PM By: Lorrin Jackson Entered By: Lorrin Jackson on 07/02/2021 12:43:53 -------------------------------------------------------------------------------- Lower Extremity Assessment Details Patient Name: Date of Service: [name], Allen Allen L. 07/02/2021 10:30 A M Medical Record Number: [id number] Patient Account Number: [id number] Date of Birth/Sex: Treating RN: Re[birth year] (70 y.o. [name] Primary Care Kris No: PA [name], NO Other Clinician: Referring Marzelle Rutten: Treating Quentina Fronek/Extender: [name] in Treatment: 14 Edema Assessment Assessed: [Left: Yes] [Right: No] Edema: [Left: Ye] [Right: s] Calf Left: Right: Point of Measurement: 39 cm From Medial Instep 41.5 cm Ankle Left: Right: Point of Measurement: 13 cm From Medial Instep 26.8 cm Vascular Assessment Pulses: Dorsalis Pedis Palpable: [Left:Yes] Electronic Signature(s) Signed: 07/02/2021 6:43:27 PM By: Lorrin Jackson Entered By: Lorrin Jackson on 07/02/2021 11:29:10 -------------------------------------------------------------------------------- Multi Wound Chart Details Patient Name: Date of Service: [name], Allen Allen L. 07/02/2021 10:30 A M Medical Record Number: [id number] Patient Account Number: [id number] Date of Birth/Sex: Treating RN: [birth year] (70 y.o. [name], [name] Primary Care Keauna Brasel: PA [name], NO Other Clinician: Referring Leslie Langille: Treating Analei Whinery/Extender: [name] in  Treatment: 14 Vital Signs Height(in): Capillary Blood Glucose(mg/dl): 82 Weight(lbs): Pulse(bpm): 76 Body Mass Index(BMI): Blood Pressure(mmHg): 121/63 Temperature(F): 98.1 Respiratory Rate(breaths/min): 17 Photos: [N/A:N/A] Right, Lateral Lower Leg Left, Posterior Lower Leg N/A Wound Location: Gradually Appeared Gradually Appeared N/A Wounding Event: Diabetic Wound/Ulcer of the Lower Lymphedema N/A Primary Etiology: Extremity Cataracts, Arrhythmia, Type II Cataracts, Arrhythmia, Type II N/A Comorbid History: Diabetes,  Osteoarthritis Diabetes, Osteoarthritis 05/22/2021 07/02/2021 N/A Date Acquired: 5 0 N/A Reese of Treatment: Healed - Epithelialized Open N/A Wound Status: 0x0x0 7x2.5x0.1 N/A Measurements L x W x D (cm) 0 13.744 N/A A (cm) : rea 0 1.374 N/A Volume (cm) : 100.00% N/A N/A % Reduction in Area: 100.00% N/A N/A % Reduction in Volume: Grade 2 Full Thickness Without Exposed N/A Classification: Support Structures Treatment Notes Electronic Signature(s) Signed: 07/02/2021 12:08:40 PM By: Kalman Shan DO Signed: 07/02/2021 7:48:44 PM By: Allen Hammock RN Entered By: Kalman Shan on 07/02/2021 12:02:47 -------------------------------------------------------------------------------- Multi-Disciplinary Care Plan Details Patient Name: Date of Service: [name], Allen Allen L. 07/02/2021 10:30 A M Medical Record Number: [id number] Patient Account Number: [id number] Date of Birth/Sex: Treating RN: [birth year] (70 y.o. [name] Primary Care Jadee Golebiewski: PA [name], NO Other Clinician: Referring Gerrell Tabet: Treating Yuleidy Rappleye/Extender: [name] in Treatment: 14 Active Inactive Venous Leg Ulcer Nursing Diagnoses: Actual venous Insuffiency (use after diagnosis is confirmed) Goals: Patient will maintain optimal edema control Date Initiated: 05/22/2021 Target Resolution Date: 07/10/2021 Goal Status:  Active Interventions: Assess peripheral edema status every visit. Compression as ordered Provide education on venous insufficiency Treatment Activities: Therapeutic compression applied : 05/22/2021 Notes: Wound/Skin Impairment Nursing Diagnoses: Impaired tissue integrity Knowledge deficit related to ulceration/compromised skin integrity Goals: Patient/caregiver will verbalize understanding of skin care regimen Date Initiated: 03/26/2021 Target Resolution Date: 07/08/2021 Goal Status: Active Ulcer/skin breakdown will have a volume reduction of 30% by week 4 Date Initiated: 03/26/2021 Date Inactivated: 06/16/2021 Target Resolution Date: 06/19/2021 Unmet Reason: see wound Goal Status: Unmet measurements. Week 11 today. Interventions: Assess patient/caregiver ability to obtain necessary supplies Assess patient/caregiver ability to perform ulcer/skin care regimen upon admission and as needed Assess ulceration(s) every visit Provide education on ulcer and skin care Notes: Electronic Signature(s) Signed: 07/02/2021 7:48:44 PM By: Allen Hammock RN Entered By: [name] on 07/02/2021 12:13:42 -------------------------------------------------------------------------------- Pain Assessment Details Patient Name: Date of Service: [name], Allen Allen L. 07/02/2021 10:30 A M Medical Record Number: [id number] Patient Account Number: [id number] Date of Birth/Sex: Treating RN: Reese[birth year] (70 y.o. [name] Primary Care Urijah Raynor: PA [name], NO Other Clinician: Referring Stephaney Steven: Treating Meaghen Vecchiarelli/Extender: [name] in Treatment: 14 Active Problems Location of Pain Severity and Description of Pain Patient Has Paino No Site Locations Pain Management and Medication Current Pain Management: Electronic Signature(s) Signed: 07/02/2021 7:48:44 PM By: Allen Hammock RN Signed: 07/03/2021 10:49:45 AM By: [name] Entered By: [name] on  07/02/2021 11:04:10 -------------------------------------------------------------------------------- Patient/Caregiver Education Details Patient Name: Date of Service: [name], Allen Allen L. 7/21/2022andnbsp10:30 [place] Record Number: [id number] Patient Account Number: [id number] Date of Birth/Gender: Treating RN: [birth year] (70 y.o. [name] Primary Care Physician: PA [name], NO Other Clinician: Referring Physician: Treating Physician/Extender: [name] in Treatment: 14 Education Assessment Education Provided To: Patient Education Topics Provided Venous: Methods: Explain/Verbal Responses: State content correctly Wound/Skin Impairment: Methods: Explain/Verbal Responses: State content correctly Electronic Signature(s) Signed: 07/02/2021 7:48:44 PM By: Allen Hammock RN Entered By: [name] on 07/02/2021 12:14:28 -------------------------------------------------------------------------------- Wound Assessment Details Patient Name: Date of Service: [name], Allen Allen L. 07/02/2021 10:30 A M Medical Record Number: [id number] Patient Account Number: [id number] Date of Birth/Sex: Treating RN: Reese[birth year] (70 y.o. [name] Primary Care Teasha Murrillo: PA [name], NO Other Clinician: Referring Rossy Virag: Treating Zain Bingman/Extender: [name] in Treatment: 14 Wound Status Wound Number: 6 Primary Etiology: Diabetic Wound/Ulcer of the Lower  Extremity Wound Location: Right, Lateral Lower Leg Wound Status: Healed - Epithelialized Wounding Event: Gradually Appeared Comorbid History: Cataracts, Arrhythmia, Type II Diabetes, Osteoarthritis Date Acquired: 05/22/2021 Reese Of Treatment: 5 Clustered Wound: No Photos Wound Measurements Length: (cm) Width: (cm) Depth: (cm) Area: (cm) Volume: (cm) 0 % Reduction in Area: 100% 0 % Reduction in Volume: 100% 0 0 0 Wound Description Classification: Grade  2 Electronic Signature(s) Signed: 07/02/2021 6:43:27 PM By: Lorrin Jackson Signed: 07/02/2021 7:48:44 PM By: Allen Hammock RN Entered By: Lorrin Jackson on 07/02/2021 11:29:26 -------------------------------------------------------------------------------- Wound Assessment Details Patient Name: Date of Service: [name], Allen Allen L. 07/02/2021 10:30 A M Medical Record Number: [id number] Patient Account Number: [id number] Date of Birth/Sex: Treating RN: [birth year] (70 y.o. [name], [name] Primary Care Dontee Jaso: PA TIENT, NO Other Clinician: Referring Annia Gomm: Treating Clarion Mooneyhan/Extender: [name] in Treatment: 14 Wound Status Wound Number: 7 Primary Etiology: Lymphedema Wound Location: Left, Posterior Lower Leg Wound Status: Open Wounding Event: Gradually Appeared Comorbid History: Cataracts, Arrhythmia, Type II Diabetes, Osteoarthritis Date Acquired: 07/02/2021 Reese Of Treatment: 0 Clustered Wound: No Photos Wound Measurements Length: (cm) Width: (cm) Depth: (cm) Area: (cm) Volume: (cm) 7 % Reduction in Area: 2.5 % Reduction in Volume: 0.1 13.744 1.374 Wound Description Classification: Full Thickness Without Exposed Support Structur es Treatment Notes Wound #7 (Lower Leg) Wound Laterality: Left, Posterior Cleanser Normal Saline Discharge Instruction: Cleanse the wound with Normal Saline prior to applying a clean dressing using gauze sponges, not tissue or cotton balls. Soap and Water Discharge Instruction: May shower and wash wound with dial antibacterial soap and water prior to dressing change. Peri-Wound Care Topical Primary Dressing KerraCel Ag Gelling Fiber Dressing, 4x5 in (silver alginate) Discharge Instruction: Apply silver alginate to wound bed as instructed Secondary Dressing Woven Gauze Sponge, Non-Sterile 4x4 in Discharge Instruction: Apply over primary dressing as directed. Secured With [employer], 4.5x3.1  (in/yd) Discharge Instruction: Secure with Kerlix as directed. 67M Medipore H Soft Cloth Surgical T 4 x 2 (in/yd) ape Discharge Instruction: Secure dressing with tape as directed. Compression Wrap Compression Stockings Circaid Juxta Lite Compression Wrap Quantity: 1 Left Leg Compression Amount: 20-30 mmHg Discharge Instruction: Apply Circaid Juxta Lite Compression Wrap daily as instructed. Apply first thing in the morning, remove at night before bed. Add-Ons Electronic Signature(s) Signed: 07/02/2021 7:48:44 PM By: Allen Hammock RN Signed: 07/03/2021 10:49:45 AM By: [name] Entered By: [name] on 07/02/2021 11:20:19 -------------------------------------------------------------------------------- Vitals Details Patient Name: Date of Service: [name], Allen Allen L. 07/02/2021 10:30 A M Medical Record Number: [id number] Patient Account Number: [id number] Date of Birth/Sex: Treating RN: [birth year]Reese (70 y.o. [name], [name] Primary Care Monifah Freehling: PA TIENT, NO Other Clinician: Referring Elyna Pangilinan: Treating Manish Ruggiero/Extender: [name] in Treatment: 14 Vital Signs Time Taken: 11:03 Temperature (F): 98.1 Pulse (bpm): 61 Respiratory Rate (breaths/min): 17 Blood Pressure (mmHg): 121/63 Capillary Blood Glucose (mg/dl): 82 Reference Range: 80 - 120 mg / dl Electronic Signature(s) Signed: 07/03/2021 10:49:45 AM By: [name] Entered By: [name] on 07/02/2021 11:04:04

## 2021-07-09 ENCOUNTER — Encounter (HOSPITAL_BASED_OUTPATIENT_CLINIC_OR_DEPARTMENT_OTHER): Payer: Medicare Other | Admitting: Internal Medicine

## 2021-07-09 DIAGNOSIS — L97829 Non-pressure chronic ulcer of other part of left lower leg with unspecified severity: Secondary | ICD-10-CM | POA: Diagnosis not present

## 2021-07-09 DIAGNOSIS — I872 Venous insufficiency (chronic) (peripheral): Secondary | ICD-10-CM

## 2021-07-09 DIAGNOSIS — I890 Lymphedema, not elsewhere classified: Secondary | ICD-10-CM

## 2021-07-09 DIAGNOSIS — E11622 Type 2 diabetes mellitus with other skin ulcer: Secondary | ICD-10-CM | POA: Diagnosis not present

## 2021-07-09 DIAGNOSIS — L97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 with unspecified severity: Secondary | ICD-10-CM | POA: Diagnosis not present

## 2021-07-09 NOTE — Progress Notes (Signed)
JEMIR, SOERGEL ([id number]) Visit Report for 07/09/2021 Arrival Information Details Patient Name: Date of Service: [name] Allen L. 07/09/2021 10:00 [name] Record Number: [id number] Patient Account Number: [id number] Date of Birth/Sex: Treating RN: 08/24/1951 (70 y.o. [name] Primary Care [name]: PA [name], NO Other Clinician: Referring [name]: Treating [name]/Extender: [name] in Treatment: 15 Visit Information History Since Last Visit Added or deleted any medications: No Patient Arrived: [name] Any new allergies or adverse reactions: No Arrival Time: 09:56 Had a fall or experienced change in No Transfer Assistance: None activities of daily living that may affect Patient Identification Verified: Yes risk of falls: Secondary Verification Process Completed: Yes Signs or symptoms of abuse/neglect since last visito No Patient Requires Transmission-Based Precautions: No Hospitalized since last visit: No Patient Has Alerts: Yes Implantable device outside of the clinic excluding No Patient Alerts: Patient on Blood Thinner cellular tissue based products placed in the center R ABI=Non compressible since last visit: Has Dressing in Place as Prescribed: Yes Has Compression in Place as Prescribed: Yes Pain Present Now: No Electronic Signature(s) Signed: 07/09/2021 5:31:46 PM By: [name] Entered By: [name] on 07/09/2021 09:58:00 -------------------------------------------------------------------------------- Clinic Level of Care Assessment Details Patient Name: Date of Service: [name] Allen L. 07/09/2021 10:00 A M Medical Record Number: [id number] Patient Account Number: [id number] Date of Birth/Sex: Treating RN: 1951-08-23 (70 y.o. [name] Primary Care [name]: PA TIENT, NO Other Clinician: Referring [name]: Treating [name] in Treatment: 15 Clinic Level of  Care Assessment Items TOOL 4 Quantity Score X- 1 0 Use when only an EandM is performed on FOLLOW-UP visit ASSESSMENTS - Nursing Assessment / Reassessment X- 1 10 Reassessment of Co-morbidities (includes updates in patient status) X- 1 5 Reassessment of Adherence to Treatment Plan ASSESSMENTS - Wound and Skin A ssessment / Reassessment X - Simple Wound Assessment / Reassessment - one wound 1 5 '[]'$  - 0 Complex Wound Assessment / Reassessment - multiple wounds X- 1 10 Dermatologic / Skin Assessment (not related to wound area) ASSESSMENTS - Focused Assessment X- 1 5 Circumferential Edema Measurements - multi extremities X- 1 10 Nutritional Assessment / Counseling / Intervention '[]'$  - 0 Lower Extremity Assessment (monofilament, tuning fork, pulses) '[]'$  - 0 Peripheral Arterial Disease Assessment (using hand held doppler) ASSESSMENTS - Ostomy and/or Continence Assessment and Care '[]'$  - 0 Incontinence Assessment and Management '[]'$  - 0 Ostomy Care Assessment and Management (repouching, etc.) PROCESS - Coordination of Care X - Simple Patient / Family Education for ongoing care 1 15 '[]'$  - 0 Complex (extensive) Patient / Family Education for ongoing care X- 1 10 Staff obtains Programmer, systems, Records, T Results / Process Orders est '[]'$  - 0 Staff telephones HHA, Nursing Homes / Clarify orders / etc '[]'$  - 0 Routine Transfer to another Facility (non-emergent condition) '[]'$  - 0 Routine Hospital Admission (non-emergent condition) '[]'$  - 0 New Admissions / Biomedical engineer / Ordering NPWT Apligraf, etc. , '[]'$  - 0 Emergency Hospital Admission (emergent condition) X- 1 10 Simple Discharge Coordination '[]'$  - 0 Complex (extensive) Discharge Coordination PROCESS - Special Needs '[]'$  - 0 Pediatric / Minor Patient Management '[]'$  - 0 Isolation Patient Management '[]'$  - 0 Hearing / Language / Visual special needs '[]'$  - 0 Assessment of Community assistance (transportation, D/C planning, etc.) '[]'$  -  0 Additional assistance / Altered mentation '[]'$  - 0 Support Surface(s) Assessment (bed, cushion, seat, etc.) INTERVENTIONS - Wound Cleansing / Measurement '[]'$  -  0 Simple Wound Cleansing - one wound '[]'$  - 0 Complex Wound Cleansing - multiple wounds '[]'$  - 0 Wound Imaging (photographs - any number of wounds) '[]'$  - 0 Wound Tracing (instead of photographs) '[]'$  - 0 Simple Wound Measurement - one wound '[]'$  - 0 Complex Wound Measurement - multiple wounds INTERVENTIONS - Wound Dressings '[]'$  - 0 Small Wound Dressing one or multiple wounds '[]'$  - 0 Medium Wound Dressing one or multiple wounds '[]'$  - 0 Large Wound Dressing one or multiple wounds '[]'$  - 0 Application of Medications - topical '[]'$  - 0 Application of Medications - injection INTERVENTIONS - Miscellaneous '[]'$  - 0 External ear exam '[]'$  - 0 Specimen Collection (cultures, biopsies, blood, body fluids, etc.) '[]'$  - 0 Specimen(s) / Culture(s) sent or taken to Lab for analysis '[]'$  - 0 Patient Transfer (multiple staff / Civil Service fast streamer / Similar devices) '[]'$  - 0 Simple Staple / Suture removal (25 or less) '[]'$  - 0 Complex Staple / Suture removal (26 or more) '[]'$  - 0 Hypo / Hyperglycemic Management (close monitor of Blood Glucose) '[]'$  - 0 Ankle / Brachial Index (ABI) - do not check if billed separately X- 1 5 Vital Signs Has the patient been seen at the hospital within the last three years: Yes Total Score: 85 Level Of Care: New/Established - Level 3 Electronic Signature(s) Signed: 07/09/2021 5:49:27 PM By: [name] Entered By: [name] on 07/09/2021 10:24:31 -------------------------------------------------------------------------------- Encounter Discharge Information Details Patient Name: Date of Service: [name] Allen L. 07/09/2021 10:00 A M Medical Record Number: [id number] Patient Account Number: [id number] Date of Birth/Sex: Treating RN: Oct 10, 1951 (70 y.o. [name] Primary Care Daven Pinckney: PA [name], NO Other  Clinician: Referring [name]: Treating [name]/Extender: [name] in Treatment: 15 Encounter Discharge Information Items Discharge Condition: Stable Ambulatory Status: [name] Discharge Destination: Home Transportation: Private Auto Accompanied By: self Schedule Follow-up Appointment: No Clinical Summary of Care: Electronic Signature(s) Signed: 07/09/2021 5:49:27 PM By: [name] Entered By: [name] on 07/09/2021 10:35:56 -------------------------------------------------------------------------------- Lower Extremity Assessment Details Patient Name: Date of Service: [name] Allen L. 07/09/2021 10:00 [place] Record Number: [id number] Patient Account Number: [id number] Date of Birth/Sex: Treating RN: [birth year] (70 y.o. [name] Primary Care Rudell Marlowe: PA [name], NO Other Clinician: Referring Laterrian Hevener: Treating Kiley Torrence/Extender: [name] in Treatment: 15 Edema Assessment Assessed: [Left: Yes] [Right: No] Edema: [Left: Ye] [Right: s] Calf Left: Right: Point of Measurement: 39 cm From Medial Instep 34.5 cm Ankle Left: Right: Point of Measurement: 13 cm From Medial Instep 25.5 cm Vascular Assessment Pulses: Dorsalis Pedis Palpable: [Left:Yes] Electronic Signature(s) Signed: 07/09/2021 5:31:46 PM By: [name] Entered By: [name] on 07/09/2021 10:05:34 -------------------------------------------------------------------------------- Multi Wound Chart Details Patient Name: Date of Service: [name] Allen L. 07/09/2021 10:00 [place] Record Number: [id number] Patient Account Number: [id number] Date of Birth/Sex: Treating RN: Dec 16, 1950 (69 y.o. [name] Primary Care Kemari Mares: PA [name], NO Other Clinician: Referring Dim Meisinger: Treating Hedwig Mcfall/Extender: [name] in Treatment: 15 Vital Signs Height(in): Pulse(bpm): 6 Weight(lbs): Blood  Pressure(mmHg): 130/72 Body Mass Index(BMI): Temperature(F): 97.6 Respiratory Rate(breaths/min): 18 Photos: [N/A:N/A] Left, Posterior Lower Leg N/A N/A Wound Location: Gradually Appeared N/A N/A Wounding Event: Lymphedema N/A N/A Primary Etiology: Cataracts, Arrhythmia, Type II N/A N/A Comorbid History: Diabetes, Osteoarthritis 07/02/2021 N/A N/A Date Acquired: 1 N/A N/A Reese of Treatment: Open N/A N/A Wound Status: 0x0x0 N/A N/A Measurements L x W x D (cm)  0 N/A N/A A (cm) : rea 0 N/A N/A Volume (cm) : 100.00% N/A N/A % Reduction in Area: 100.00% N/A N/A % Reduction in Volume: Full Thickness Without Exposed N/A N/A Classification: Support Structures Treatment Notes Electronic Signature(s) Signed: 07/09/2021 10:45:52 AM By: Kalman Shan DO Signed: 07/09/2021 5:49:27 PM By: [name] Entered By: Kalman Shan on 07/09/2021 10:40:45 -------------------------------------------------------------------------------- Multi-Disciplinary Care Plan Details Patient Name: Date of Service: [name] Allen L. 07/09/2021 10:00 [place] Record Number: [id number] Patient Account Number: [id number] Date of Birth/Sex: Treating RN: 12-19-1950 (70 y.o. [name] Primary Care Cele Mote: PA Darnelle Spangle Other Clinician: Referring Brevyn Ring: Treating Tex Conroy/Extender: [name] in Treatment: 15 Active Inactive Electronic Signature(s) Signed: 07/09/2021 5:49:27 PM By: [name] Entered By: [name] on 07/09/2021 10:23:53 -------------------------------------------------------------------------------- Pain Assessment Details Patient Name: Date of Service: [name] Allen L. 07/09/2021 10:00 [place] Record Number: [id number] Patient Account Number: [id number] Date of Birth/Sex: Treating RN: 1951/11/06 (70 y.o. [name] Primary Care [place] Record: PA [name], NO Other Clinician: Referring Hoa Deriso: Treating  Lisset Ketchem/Extender: [name] in Treatment: 15 Active Problems Location of Pain Severity and Description of Pain Patient Has Paino No Site Locations Pain Management and Medication Current Pain Management: Electronic Signature(s) Signed: 07/09/2021 5:31:46 PM By: [name] Entered By: [name] on 07/09/2021 10:00:59 -------------------------------------------------------------------------------- Patient/Caregiver Education Details Patient Name: Date of Service: [name] [place] 7/28/2022andnbsp10:00 [place] Record Number: [id number] Patient Account Number: [id number] Date of Birth/Gender: Treating RN: 1951/08/14 (69 y.o. [name] Primary Care Physician: PA [name], NO Other Clinician: Referring Physician: Treating Physician/Extender: [name] in Treatment: 15 Education Assessment Education Provided To: Patient Education Topics Provided Wound/Skin Impairment: Handouts: Skin Care Do's and Dont's Methods: Explain/Verbal Responses: Reinforcements needed Electronic Signature(s) Signed: 07/09/2021 5:49:27 PM By: [name] Entered By: [name] on 07/09/2021 10:24:05 -------------------------------------------------------------------------------- Wound Assessment Details Patient Name: Date of Service: [name] Allen L. 07/09/2021 10:00 [place] Record Number: [id number] Patient Account Number: [id number] Date of Birth/Sex: Treating RN: 1951-11-17 (70 y.o. [name] Primary Care Cathy Ropp: PA [name], NO Other Clinician: Referring Willella Harding: Treating Redell Nazir/Extender: [name] in Treatment: 15 Wound Status Wound Number: 7 Primary Etiology: Lymphedema Wound Location: Left, Posterior Lower Leg Wound Status: Open Wounding Event: Gradually Appeared Comorbid History: Cataracts, Arrhythmia, Type II Diabetes, Osteoarthritis Date Acquired: 07/02/2021 Reese Of  Treatment: 1 Clustered Wound: No Photos Wound Measurements Length: (cm) 0 Width: (cm) 0 Depth: (cm) 0 Area: (cm) 0 Volume: (cm) 0 % Reduction in Area: 100% % Reduction in Volume: 100% Wound Description Classification: Full Thickness Without Exposed Support Structur es Electronic Signature(s) Signed: 07/09/2021 5:31:46 PM By: [name] Entered By: [name] on 07/09/2021 10:08:12 -------------------------------------------------------------------------------- Vitals Details Patient Name: Date of Service: [name] Allen L. 07/09/2021 10:00 A M Medical Record Number: [id number] Patient Account Number: [id number] Date of Birth/Sex: Treating RN: 1951-01-16 (70 y.o. [name] Primary Care Allan Minotti: PA TIENT, NO Other Clinician: Referring Maddix Kliewer: Treating Jahmarion Popoff/Extender: [name] in Treatment: 15 Vital Signs Time Taken: 09:58 Temperature (F): 97.6 Pulse (bpm): 67 Respiratory Rate (breaths/min): 18 Blood Pressure (mmHg): 130/72 Reference Range: 80 - 120 mg / dl Electronic Signature(s) Signed: 07/09/2021 5:31:46 PM By: [name] Entered By: [name] on 07/09/2021 10:00:50

## 2021-07-09 NOTE — Progress Notes (Signed)
[name] ([id number]) Visit Report for 07/09/2021 Chief Complaint Document Details Patient Name: Date of Service: [name], [place] Allen L. 07/09/2021 10:00 [place] Record Number: [id number] Patient Account Number: [id number] Date of Birth/Sex: Treating RN: [birth year] (70 y.o. [name] Primary Care Provider: PA [name], NO Other Clinician: Referring Provider: Treating Provider/Extender: [name]: 15 Information Obtained from: Patient Chief Complaint bilateral lower extremity swelling with wounds Electronic Signature(s) Signed: 07/09/2021 10:45:52 AM By: Kalman Shan DO Entered By: Kalman Shan on 07/09/2021 10:40:56 -------------------------------------------------------------------------------- HPI Details Patient Name: Date of Service: [name], [name] 07/09/2021 10:00 [place] Record Number: [id number] Patient Account Number: [id number] Date of Birth/Sex: Treating RN: [birth year] (70 y.o. [name] Primary Care Provider: PA [name], NO Other Clinician: Referring Provider: Treating Provider/Extender: [name]: 15 History of Present Illness HPI Description: [name] is a 70 year old male with a past medical history of type 2 diabetes and essential hypertension who presents to the clinic today for bilateral lower extremity wounds. He was recently seen in the ED on 3/9 for bilateral lower extremity edema. He had a DVT study that was negative for clots. He was given triamcinolone cream and advised to elevate legs and obtain compression stockings. He has been using the triamcinolone cream however he is unable to use compression stockings due to difficulty of putting on. Patient states that for the past 2 years he has had opening and closing of weeping wounds to his legs bilaterally. They spontaneously open and close. They are often open for several weeks before they heal  spontaneously. He denies any purulent drainage, increased warmth or increased erythema to the skin. 4/21; patient presents for 1 week follow-up. The wounds on the right leg have healed however he has now 2 new wounds 1 on the anterior and posterior thigh that occurred from him scratching underneath the wrap. The left leg wound has improved and almost closed 4/28; patient presents for 1 week follow-up. He continues to have a wound on the right anterior leg that is showing signs of healing. The posterior right thigh has closed. He now has a new wound to the left leg that is limited to skin breakdown. 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 The wounds are occurring because he goes underneath the leg wraps and scratches creating new wounds. He has no complaints today. 5/5; patient presents for 1 week follow-up. He no longer has any open wounds to his lower extremities bilaterally. He continues to have scattered excoriation marks from where he scratches. These are scabbed over. He states he is going to see dermatology this month. Overall he is doing well and is happy with his care. Readmission 6/10 Patient was followed for open wounds limited to skin breakdown to his lower extremities bilaterally 2/2 venous insufficiency. Once the wounds healed he was discharged with juxta lite compressions and these were placed in office. He was discharged on 5/5. He states that he has not taken the wraps off since discharge and has had them on for over a month. He did not want to take them off because they were very comfortable. He does not have any difficulty putting these on and off if he wanted to. He states that he does not take a shower and just cleans the other parts of his body at the sink. He noticed that 2  days ago he was having some drainage at the top of the wrap and called in to be evaluated. He states that he has not seen the wounds  until the wraps were taken off today in office. He denies pain or increased warmth or erythema to the area 6/27; patient presents for 2-week follow-up. He has had increased swelling and drainage to the wounds over the last week. He reports increased erythema to the area. He does not have pain however. He denies purulent drainage or fever/chills. 7/5; patient presents for 1 week follow-up. He was prescribed Keflex at last clinic visit and is currently taking this. He reports significant improvement to his leg swelling, tenderness and erythema. He denies signs of infection. 7/21; patient presents for 2-week follow-up. He reports improvement in healing in his right lower extremity. Unfortunately this morning he noticed skin breakdown to his left lower extremity with increased redness and warmth. 7/28; patient presents for 1 week follow-up. He reports completing his antibiotics. He states that his wounds are healed. He has been using his juxta light compressions. He has no issues or complaints today. Electronic Signature(s) Signed: 07/09/2021 10:45:52 AM By: Kalman Shan DO Entered By: Kalman Shan on 07/09/2021 10:41:57 -------------------------------------------------------------------------------- Physical Exam Details Patient Name: Date of Service: [name], [name] 07/09/2021 10:00 A M Medical Record Number: [id number] Patient Account Number: [id number] Date of Birth/Sex: Treating RN: 10-26-1951 (70 y.o. [name] Primary Care Provider: PA [name], NO Other Clinician: Referring Provider: Treating Provider/Extender: [name]: 15 Constitutional respirations regular, non-labored and within target range for patient.. Cardiovascular 2+ dorsalis pedis/posterior tibialis pulses. Psychiatric pleasant and cooperative. Notes Bilateral lower extremity without open wounds. Dryness with flaking noted throughout. No signs of infection. Electronic  Signature(s) Signed: 07/09/2021 10:45:52 AM By: Kalman Shan DO Entered By: Kalman Shan on 07/09/2021 10:43:23 -------------------------------------------------------------------------------- Physician Orders Details Patient Name: Date of Service: [name], [name] 07/09/2021 10:00 A M Medical Record Number: [id number] Patient Account Number: [id number] Date of Birth/Sex: Treating RN: [birth year] (70 y.o. 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 Edema Control - Lymphedema / SCD / Other Elevate legs to the level of the heart or above for 30 minutes daily and/or when sitting, a frequency of: - throughout the day. Avoid standing for long periods of time. Exercise regularly Moisturize legs daily. - every night before bed. Compression stocking or Garment 30-40 mm/Hg pressure to: - wear juxtalites HD daily. apply in the morning and remove at night. Electronic Signature(s) Signed: 07/09/2021 10:45:52 AM By: Kalman Shan DO Entered By: Kalman Shan on 07/09/2021 10:43:38 -------------------------------------------------------------------------------- Problem List Details Patient Name: Date of Service: [name], [name] 07/09/2021 10:00 A M Medical Record Number: [id number] Patient Account Number: [id number] Date of Birth/Sex: Treating RN: 1951/03/11 (70 y.o.  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 with unspecified 03/26/2021 No Yes severity L97.829 Non-pressure chronic ulcer of other part of left lower leg with unspecified 03/26/2021 No Yes severity I89.0 Lymphedema, not elsewhere classified 03/26/2021 No Yes I87.2 Venous insufficiency (chronic) (peripheral) 03/26/2021 No Yes L03.115 Cellulitis of right lower limb 06/08/2021 No Yes Inactive Problems Resolved Problems Electronic Signature(s) Signed: 07/09/2021 10:45:52 AM By: Kalman Shan DO Entered By: Kalman Shan on 07/09/2021 10:40:40 -------------------------------------------------------------------------------- Progress Note Details Patient Name: Date of Service: [name], [name] 07/09/2021 10:00 [place] Record Number: [id number] Patient Account Number: [id number] Date of Birth/Sex: Treating RN: 09-Jun-1951 (70 y.o. [name] Primary Care Provider: PA [name], NO Other Clinician: Referring Provider: Treating Provider/Extender: [name]: 15 Subjective Chief Complaint Information obtained from Patient bilateral lower extremity swelling with wounds History of Present Illness (HPI) Cashius Holmen is a 70 year old male with a past medical history of type 2 diabetes and essential hypertension who presents to the clinic today for bilateral lower extremity wounds. He was recently seen in the ED on 3/9 for bilateral lower extremity edema. He had a DVT study that was negative for clots. He was given triamcinolone cream and advised to elevate legs and obtain compression stockings. He has been using the triamcinolone cream however he is unable to use compression  stockings due to difficulty of putting on. Patient states that for the past 2 years he has had opening and closing of weeping wounds to his legs bilaterally. They spontaneously open and close. They are often open for several weeks before they heal spontaneously. He denies any purulent drainage, increased warmth or increased erythema to the skin. 4/21; patient presents for 1 week follow-up. The wounds on the right leg have healed however he has now 2 new wounds 1 on the anterior and posterior thigh that occurred from him scratching underneath the wrap. The left leg wound has improved and almost closed 4/28; patient presents for 1 week follow-up. He continues to have a wound on the right anterior leg that is showing signs of healing. The posterior right thigh has closed. He now has a new wound to the left leg that is limited to skin breakdown. 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 The wounds are occurring because he goes underneath the leg wraps and scratches creating new wounds. He has no complaints today. 5/5; patient presents for 1 week follow-up. He no longer has any open wounds to his lower extremities bilaterally. He continues to have scattered excoriation marks from where he scratches. These are scabbed over. He states he is going to see dermatology this month. Overall he is doing well and is happy with his care. Readmission 6/10 Patient was followed for open wounds limited to skin breakdown to his lower extremities bilaterally 2/2 venous insufficiency. Once the wounds healed he was discharged with juxta lite compressions and these were placed in office. He was discharged on 5/5. He states that he has not taken the wraps off since discharge and has had them on for over a month. He did not want to take them off because they were very comfortable. He does not have any difficulty putting these on and off if he wanted to. He  states that he does not take a shower and just cleans the other parts of his body at the  sink. He noticed that 2 days ago he was having some drainage at the top of the wrap and called in to be evaluated. He states that he has not seen the wounds until the wraps were taken off today in office. He denies pain or increased warmth or erythema to the area 6/27; patient presents for 2-week follow-up. He has had increased swelling and drainage to the wounds over the last week. He reports increased erythema to the area. He does not have pain however. He denies purulent drainage or fever/chills. 7/5; patient presents for 1 week follow-up. He was prescribed Keflex at last clinic visit and is currently taking this. He reports significant improvement to his leg swelling, tenderness and erythema. He denies signs of infection. 7/21; patient presents for 2-week follow-up. He reports improvement in healing in his right lower extremity. Unfortunately this morning he noticed skin breakdown to his left lower extremity with increased redness and warmth. 7/28; patient presents for 1 week follow-up. He reports completing his antibiotics. He states that his wounds are healed. He has been using his juxta light compressions. He has no issues or complaints today. Patient History Unable to Obtain Patient History due to Altered Mental Status. Information obtained from Patient. Family History Cancer - Mother,Maternal Grandparents, Heart Disease - Maternal Grandparents, Hypertension - Maternal Grandparents, No family history of Diabetes, Hereditary Spherocytosis. Social History Former smoker - ended on 09/14/1983, Alcohol Use - Rarely, Drug Use - No History, Caffeine Use - Never. Medical History Eyes Patient has history of Cataracts - 2014 Cardiovascular Patient has history of Arrhythmia - A-Fib Endocrine Patient has history of Type II Diabetes Musculoskeletal Patient has history of Osteoarthritis Medical A Surgical  History Notes nd Hematologic/Lymphatic Hyperbilirubinemia Gastrointestinal Rectal Pain-Constipation Genitourinary CKD stage 3-BPH Oncologic Colon Cancer-Colon Re-section Objective Constitutional respirations regular, non-labored and within target range for patient.. Vitals Time Taken: 9:58 AM, Temperature: 97.6 F, Pulse: 67 bpm, Respiratory Rate: 18 breaths/min, Blood Pressure: 130/72 mmHg. Cardiovascular 2+ dorsalis pedis/posterior tibialis pulses. Psychiatric pleasant and cooperative. General Notes: Bilateral lower extremity without open wounds. Dryness with flaking noted throughout. No signs of infection. Integumentary (Hair, Skin) Wound #7 status is Open. Original cause of wound was Gradually Appeared. The date acquired was: 07/02/2021. The wound has been in treatment 1 weeks. The wound is located on the Left,Posterior Lower Leg. The wound measures 0cm length x 0cm width x 0cm depth; 0cm^2 area and 0cm^3 volume. 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 His left leg looks significantly improved without erythema. I recommended using juxta light compressions bilaterally. He can place these on in the morning and take these off before bed. I recommended he lotion his legs when he takes off his juxta lites at night. He knows to call with any questions or concerns. He can follow-up as needed. Plan Discharge From [facility] Services: Discharge from [place] - Call if any future wound care needs. Edema Control - Lymphedema / SCD / Other: Elevate legs to the level of the heart or above for 30 minutes daily and/or when sitting, a frequency of: - throughout the day. Avoid standing  for long periods of time. Exercise regularly Moisturize legs daily. - every night before bed. Compression stocking or Garment 30-40 mm/Hg pressure to: - wear juxtalites  HD daily. apply in the morning and remove at night. 1. Juxta lites bilaterally daily 2. Discharge from our clinic due to closed wounds 3. Follow-up as needed Electronic Signature(s) Signed: 07/09/2021 10:45:52 AM By: Kalman Shan DO Entered By: Kalman Shan on 07/09/2021 10:45:07 -------------------------------------------------------------------------------- HxROS Details Patient Name: Date of Service: [name], [name] 07/09/2021 10:00 A M Medical Record Number: [id number] Patient Account Number: [id number] Date of Birth/Sex: Treating RN: 09/04/1951 (70 y.o. [name] Primary Care Provider: PA [name], NO Other Clinician: Referring Provider: Treating Provider/Extender: [name]: 15 Unable to Obtain Patient History due to Altered Mental Status Information Obtained From Patient Eyes Medical History: Positive for: Cataracts - 2014 Hematologic/Lymphatic Medical History: Past Medical History Notes: Hyperbilirubinemia Cardiovascular Medical History: Positive for: Arrhythmia - A-Fib Gastrointestinal Medical History: Past Medical History Notes: Rectal Pain-Constipation Endocrine Medical History: Positive for: Type II Diabetes Time with diabetes: Since 2014 Treated with: Oral agents Blood sugar tested every day: Yes Tested : Genitourinary Medical History: Past Medical History Notes: CKD stage 3-BPH Musculoskeletal Medical History: Positive for: Osteoarthritis Oncologic Medical History: Past Medical History Notes: Colon Cancer-Colon Re-section HBO Extended History Items Eyes: Cataracts Immunizations Pneumococcal Vaccine: Received Pneumococcal Vaccination: No Implantable Devices None Family and Social History Cancer: Yes - Mother,Maternal  Grandparents; Diabetes: No; Heart Disease: Yes - Maternal Grandparents; Hereditary Spherocytosis: No; Hypertension: Yes - Maternal Grandparents; Former smoker - ended on 09/14/1983; Alcohol Use: Rarely; Drug Use: No History; Caffeine Use: Never; Financial Concerns: No; Food, Clothing or Shelter Needs: No; Support System Lacking: No; Transportation Concerns: No Electronic Signature(s) Signed: 07/09/2021 10:45:52 AM By: Kalman Shan DO Signed: 07/09/2021 5:49:27 PM By: Deon Pilling Entered By: Kalman Shan on 07/09/2021 10:42:03 -------------------------------------------------------------------------------- SuperBill Details Patient Name: Date of Service: [name], [name] 07/09/2021 Medical Record Number: [id number] Patient Account Number: [id number] Date of Birth/Sex: Treating RN: Oct 13, 1951 (70 y.o. [name] Primary Care Provider: PA TIENT, NO Other Clinician: Referring Provider: Treating Provider/Extender: [name]: 15 Diagnosis Coding ICD-10 Codes Code Description L97.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 with unspecified severity L97.829 Non-pressure chronic ulcer of other part of left lower leg with unspecified severity I89.0 Lymphedema, not elsewhere classified I87.2 Venous insufficiency (chronic) (peripheral) L03.116 Cellulitis of left lower limb Facility Procedures CPT4 Code: [id number] Description: 99213 - WOUND CARE VISIT-LEV 3 EST PT Modifier: Quantity: 1 Physician Procedures : CPT4 Code Description Modifier [id number] - WC PHYS LEVEL 3 - EST PT ICD-10 Diagnosis Description L97.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 with unspecified severity L97.829 Non-pressure chronic ulcer of other part of left  lower leg with unspecified severity I89.0 Lymphedema, not elsewhere classified I87.2 Venous insufficiency (chronic) (peripheral) Quantity: 1 Electronic  Signature(s) Signed: 07/09/2021 10:45:52 AM By: Kalman Shan DO Entered By: Kalman Shan on 07/09/2021 10:45:30

## 2021-12-17 ENCOUNTER — Other Ambulatory Visit: Payer: Self-pay

## 2021-12-17 ENCOUNTER — Encounter (HOSPITAL_BASED_OUTPATIENT_CLINIC_OR_DEPARTMENT_OTHER): Payer: Medicare Other | Attending: Internal Medicine | Admitting: Internal Medicine

## 2021-12-17 DIAGNOSIS — L97528 Non-pressure chronic ulcer of other part of left foot with other specified severity: Secondary | ICD-10-CM | POA: Diagnosis not present

## 2021-12-17 DIAGNOSIS — I87333 Chronic venous hypertension (idiopathic) with ulcer and inflammation of bilateral lower extremity: Secondary | ICD-10-CM | POA: Insufficient documentation

## 2021-12-17 DIAGNOSIS — I872 Venous insufficiency (chronic) (peripheral): Secondary | ICD-10-CM | POA: Diagnosis not present

## 2021-12-17 DIAGNOSIS — R600 Localized edema: Secondary | ICD-10-CM | POA: Diagnosis not present

## 2021-12-17 DIAGNOSIS — E1140 Type 2 diabetes mellitus with diabetic neuropathy, unspecified: Secondary | ICD-10-CM | POA: Diagnosis not present

## 2021-12-17 DIAGNOSIS — E11621 Type 2 diabetes mellitus with foot ulcer: Secondary | ICD-10-CM | POA: Insufficient documentation

## 2021-12-17 DIAGNOSIS — I10 Essential (primary) hypertension: Secondary | ICD-10-CM | POA: Diagnosis not present

## 2021-12-17 NOTE — Progress Notes (Signed)
[name], [name] ([id number]) Visit Report for 12/17/2021 Abuse/Suicide Risk Screen Details Patient Name: Date of Service: [name], Allen [name] 12/17/2021 9:00 [place] Record Number: [id number] Patient Account Number: [id number] Date of Birth/Sex: Treating RN: 03/12/1951 (71 y.o. [name] Primary Care Lorretta Kerce: PA [name], [place] Other Clinician: Referring Lopaka Karge: Treating Rodd Heft/Extender: Marylin Crosby Weeks in Treatment: 0 Abuse/Suicide Risk Screen Items Answer ABUSE RISK SCREEN: Has anyone close to you tried to hurt or harm you recentlyo No Do you feel uncomfortable with anyone in your familyo No Has anyone forced you do things that you didnt want to doo No Electronic Signature(s) Signed: 12/17/2021 5:42:44 PM By: Baruch Gouty RN, BSN Entered By: Baruch Gouty on 12/17/2021 09:59:37 -------------------------------------------------------------------------------- Activities of Daily Living Details Patient Name: Date of Service: [name], Allen [name] 12/17/2021 9:00 [place] Record Number: [id number] Patient Account Number: [id number] Date of Birth/Sex: Treating RN: 1951-09-09 (71 y.o. [name] Primary Care [name] Killian: PA [name], [place] Other Clinician: Referring Rasheedah Reis: Treating Nesta Scaturro/Extender: Allena Katz in Treatment: 0 Activities of Daily Living Items Answer Activities of Daily Living (Please select one for each item) Drive Automobile Not Able T Medications ake Need Assistance Use T elephone Completely Able Care for Appearance Need Assistance Use T oilet Completely Able Bath / Shower Need Assistance Dress Self Completely Able Feed Self Completely Able Walk Completely Able Get In / Out Bed Completely Able Housework Need Assistance Prepare Meals Need Assistance Handle Money Need Assistance Shop for Self Need Assistance Electronic Signature(s) Signed: 12/17/2021 5:42:44 PM By: Baruch Gouty RN, BSN Entered By:  Baruch Gouty on 12/17/2021 10:00:31 -------------------------------------------------------------------------------- Education Screening Details Patient Name: Date of Service: [name], Allen [name] 12/17/2021 9:00 [place] Record Number: [id number] Patient Account Number: [id number] Date of Birth/Sex: Treating RN: 1951/02/11 (71 y.o. [name] Primary Care Orien Mayhall: PA [name], [place] Other Clinician: Referring Kanon Novosel: Treating Somaya Grassi/Extender: Allena Katz in Treatment: 0 Primary Learner Assessed: Patient Learning Preferences/Education Level/Primary Language Learning Preference: Explanation, Demonstration, Printed Material Highest Education Level: College or Above Preferred Language: English Cognitive Barrier Language Barrier: No Translator Needed: No Memory Deficit: No Emotional Barrier: No Cultural/Religious Beliefs Affecting Medical Care: No Physical Barrier Impaired Vision: No Impaired Hearing: No Decreased Hand dexterity: No Knowledge/Comprehension Knowledge Level: High Comprehension Level: High Ability to understand written instructions: High Ability to understand verbal instructions: High Motivation Anxiety Level: Calm Cooperation: Cooperative Education Importance: Acknowledges Need Interest in Health Problems: Asks Questions Perception: Coherent Willingness to Engage in Self-Management High Activities: Readiness to Engage in Self-Management High Activities: Electronic Signature(s) Signed: 12/17/2021 5:42:44 PM By: Baruch Gouty RN, BSN Entered By: Baruch Gouty on 12/17/2021 10:01:04 -------------------------------------------------------------------------------- Fall Risk Assessment Details Patient Name: Date of Service: [name], Allen [name] 12/17/2021 9:00 [place] Record Number: [id number] Patient Account Number: [id number] Date of Birth/Sex: Treating RN: July 11, 1951 (71 y.o. [name] Primary Care Willona Phariss: PA  [name], [place] Other Clinician: Referring Seline Enzor: Treating Blaize Epple/Extender: Allena Katz in Treatment: 0 Fall Risk Assessment Items Have you had 2 or more falls in the last 12 monthso 0 No Have you had any fall that resulted in injury in the last 12 monthso 0 No FALLS RISK SCREEN History of falling - immediate or within 3 months 0 No Secondary diagnosis (Do you have 2 or more medical diagnoseso) 0 No Ambulatory aid None/bed rest/wheelchair/nurse 0 Yes Crutches/cane/walker 0 No Furniture 0 No  Intravenous therapy Access/Saline/Heparin Lock 0 No Gait/Transferring Normal/ bed rest/ wheelchair 0 Yes Weak (short steps with or without shuffle, stooped but able to lift head while walking, may seek 0 No support from furniture) Impaired (short steps with shuffle, may have difficulty arising from chair, head down, impaired 0 No balance) Mental Status Oriented to own ability 0 Yes Electronic Signature(s) Signed: 12/17/2021 5:42:44 PM By: Baruch Gouty RN, BSN Entered By: Baruch Gouty on 12/17/2021 10:01:21 -------------------------------------------------------------------------------- Foot Assessment Details Patient Name: Date of Service: [name], Allen [name] 12/17/2021 9:00 [place] Record Number: [id number] Patient Account Number: [id number] Date of Birth/Sex: Treating RN: June 28, 1951 (71 y.o. [name] Primary Care Jahzara Slattery: PA [place], [place] Other Clinician: Referring Arsenia Goracke: Treating Gerrick Ray/Extender: Marylin Crosby Weeks in Treatment: 0 Foot Assessment Items Site Locations + = Sensation present, - = Sensation absent, C = Callus, U = Ulcer R = Redness, W = Warmth, M = Maceration, PU = Pre-ulcerative lesion F = Fissure, S = Swelling, D = Dryness Assessment Right: Left: Other Deformity: No No Prior Foot Ulcer: No No Prior Amputation: No No Charcot Joint: No No Ambulatory Status: Ambulatory Without Help Gait: Steady Electronic  Signature(s) Signed: 12/17/2021 5:42:44 PM By: Baruch Gouty RN, BSN Entered By: Baruch Gouty on 12/17/2021 10:03:48 -------------------------------------------------------------------------------- Nutrition Risk Screening Details Patient Name: Date of Service: [name], Allen [name] 12/17/2021 9:00 [facility] Record Number: [id number] Patient Account Number: [id number] Date of Birth/Sex: Treating RN: Oct 13, 1951 (71 y.o. [name] Primary Care Maggie Dworkin: PA [name], [place] Other Clinician: Referring Ileene Allie: Treating Yerania Chamorro/Extender: Marylin Crosby Weeks in Treatment: 0 Height (in): 75 Weight (lbs): 245 Body Mass Index (BMI): 30.6 Nutrition Risk Screening Items Score Screening NUTRITION RISK SCREEN: I have an illness or condition that made me change the kind and/or amount of food I eat 0 No I eat fewer than two meals per day 0 No I eat few fruits and vegetables, or milk products 0 No I have three or more drinks of beer, liquor or wine almost every day 0 No I have tooth or mouth problems that make it hard for me to eat 0 No I don't always have enough money to buy the food I need 0 No I eat alone most of the time 0 No I take three or more different prescribed or over-the-counter drugs a day 1 Yes Without wanting to, I have lost or gained 10 pounds in the last six months 0 No I am not always physically able to shop, cook and/or feed myself 0 No Nutrition Protocols Good Risk Protocol 0 No interventions needed Moderate Risk Protocol High Risk Proctocol Risk Level: Good Risk Score: 1 Electronic Signature(s) Signed: 12/17/2021 5:42:44 PM By: Baruch Gouty RN, BSN Entered By: Baruch Gouty on 12/17/2021 10:01:44

## 2021-12-18 NOTE — Progress Notes (Signed)
[name], [name] ([id number]) Visit Report for 12/17/2021 Allergy List Details Patient Name: Date of Service: [name], [name] 12/17/2021 9:00 [place] Record Number: [id number] Patient Account Number: [id number] Date of Birth/Sex: Treating RN: [birth year] (71 y.o. [name] Primary Care [name]: PA [name], [place] Other Clinician: Referring Anaija Wissink: Treating [name]/Extender: Marylin Crosby Weeks in Treatment: 0 Allergies Active Allergies No Known Allergies Allergy Notes Electronic Signature(s) Signed: 12/17/2021 5:42:44 PM By: Baruch Gouty RN, BSN Entered By: Baruch Gouty on 12/17/2021 09:54:31 -------------------------------------------------------------------------------- Arrival Information Details Patient Name: Date of Service: [name], [name] 12/17/2021 9:00 [facility] Record Number: [id number] Patient Account Number: [id number] Date of Birth/Sex: Treating RN: Jul 10, 1951 (71 y.o. [name] Primary Care [name]: PA [name], [place] Other Clinician: Referring Andrews Tener: Treating Charlene Cowdrey/Extender: Allena Katz in Treatment: 0 Visit Information Patient Arrived: Ambulatory Arrival Time: 09:40 Accompanied By: self Transfer Assistance: None Patient Identification Verified: Yes Secondary Verification Process Completed: Yes Patient Requires Transmission-Based Precautions: No Patient Has Alerts: Yes Patient Alerts: R and L ABIs N/C History Since Last Visit Added or deleted any medications: Yes Any new allergies or adverse reactions: No Had a fall or experienced change in activities of daily living that may affect risk of falls: No Signs or symptoms of abuse/neglect since last visito No Hospitalized since last visit: No Implantable device outside of the clinic excluding cellular tissue based products placed in the center since last visit: No Has Compression in Place as Prescribed: Yes Electronic Signature(s) Signed:  12/17/2021 5:42:44 PM By: Baruch Gouty RN, BSN Entered By: Baruch Gouty on 12/17/2021 10:19:18 -------------------------------------------------------------------------------- Clinic Level of Care Assessment Details Patient Name: Date of Service: [name], [name] 12/17/2021 9:00 [place] Record Number: [id number] Patient Account Number: [id number] Date of Birth/Sex: Treating RN: 1951/02/28 (71 y.o. [name] Primary Care Blondell Laperle: PA [name], [place] Other Clinician: Referring [name]: Treating [name]/Extender: Allena Katz in Treatment: 0 Clinic Level of Care Assessment Items TOOL 2 Quantity Score []  - 0 Use when only an EandM is performed on the INITIAL visit ASSESSMENTS - Nursing Assessment / Reassessment X- 1 20 General Physical Exam (combine w/ comprehensive assessment (listed just below) when performed on new pt. evals) X- 1 25 Comprehensive Assessment (HX, ROS, Risk Assessments, Wounds Hx, etc.) ASSESSMENTS - Wound and Skin A ssessment / Reassessment X - Simple Wound Assessment / Reassessment - one wound 1 5 []  - 0 Complex Wound Assessment / Reassessment - multiple wounds X- 1 10 Dermatologic / Skin Assessment (not related to wound area) ASSESSMENTS - Ostomy and/or Continence Assessment and Care []  - 0 Incontinence Assessment and Management []  - 0 Ostomy Care Assessment and Management (repouching, etc.) PROCESS - Coordination of Care X - Simple Patient / Family Education for ongoing care 1 15 []  - 0 Complex (extensive) Patient / Family Education for ongoing care X- 1 10 Staff obtains Programmer, systems, Records, T Results / Process Orders est X- 1 10 Staff telephones HHA, Nursing Homes / Clarify orders / etc []  - 0 Routine Transfer to another Facility (non-emergent condition) []  - 0 Routine Hospital Admission (non-emergent condition) X- 1 15 New Admissions / Biomedical engineer / Ordering NPWT Apligraf, etc. , []  - 0 Emergency  Hospital Admission (emergent condition) X- 1 10 Simple Discharge Coordination []  - 0 Complex (extensive) Discharge Coordination PROCESS - Special Needs []  - 0 Pediatric / Minor Patient Management []  - 0 Isolation Patient Management []  -  0 Hearing / Language / Visual special needs []  - 0 Assessment of Community assistance (transportation, D/C planning, etc.) []  - 0 Additional assistance / Altered mentation []  - 0 Support Surface(s) Assessment (bed, cushion, seat, etc.) INTERVENTIONS - Wound Cleansing / Measurement X- 1 5 Wound Imaging (photographs - any number of wounds) []  - 0 Wound Tracing (instead of photographs) X- 1 5 Simple Wound Measurement - one wound []  - 0 Complex Wound Measurement - multiple wounds X- 1 5 Simple Wound Cleansing - one wound []  - 0 Complex Wound Cleansing - multiple wounds INTERVENTIONS - Wound Dressings X - Small Wound Dressing one or multiple wounds 1 10 []  - 0 Medium Wound Dressing one or multiple wounds []  - 0 Large Wound Dressing one or multiple wounds []  - 0 Application of Medications - injection INTERVENTIONS - Miscellaneous []  - 0 External ear exam []  - 0 Specimen Collection (cultures, biopsies, blood, body fluids, etc.) []  - 0 Specimen(s) / Culture(s) sent or taken to Lab for analysis []  - 0 Patient Transfer (multiple staff / Civil Service fast streamer / Similar devices) []  - 0 Simple Staple / Suture removal (25 or less) []  - 0 Complex Staple / Suture removal (26 or more) []  - 0 Hypo / Hyperglycemic Management (close monitor of Blood Glucose) []  - 0 Ankle / Brachial Index (ABI) - do not check if billed separately Has the patient been seen at the hospital within the last three years: Yes Total Score: 145 Level Of Care: New/Established - Level 4 Electronic Signature(s) Signed: 12/17/2021 5:42:44 PM By: Baruch Gouty RN, BSN Entered By: Baruch Gouty on 12/17/2021  16:48:11 -------------------------------------------------------------------------------- Encounter Discharge Information Details Patient Name: Date of Service: [name], [name] 12/17/2021 9:00 [facility] Record Number: [id number] Patient Account Number: [id number] Date of Birth/Sex: Treating RN: June 23, 1951 (71 y.o. [name] Primary Care Machael Raine: PA [name], [place] Other Clinician: Referring Mikaele Stecher: Treating Keylor Rands/Extender: Allena Katz in Treatment: 0 Encounter Discharge Information Items Discharge Condition: Stable Ambulatory Status: Ambulatory Discharge Destination: Home Transportation: Other Accompanied By: self Schedule Follow-up Appointment: Yes Clinical Summary of Care: Patient Declined Notes facility transportation Electronic Signature(s) Signed: 12/17/2021 5:42:44 PM By: Baruch Gouty RN, BSN Entered By: Baruch Gouty on 12/17/2021 16:51:09 -------------------------------------------------------------------------------- Lower Extremity Assessment Details Patient Name: Date of Service: [name], [name] 12/17/2021 9:00 [facility] Record Number: [id number] Patient Account Number: [id number] Date of Birth/Sex: Treating RN: 06-15-1951 (71 y.o. [name] Primary Care Elizardo Chilson: PA [name], [place] Other Clinician: Referring Elbert Spickler: Treating Shawne Eskelson/Extender: Marylin Crosby Weeks in Treatment: 0 Edema Assessment Assessed: [Left: No] [Right: No] E[Left: dema] [Right: :] Calf Left: Right: Point of Measurement: From Medial Instep 40 cm 47.5 cm Ankle Left: Right: Point of Measurement: From Medial Instep 27 cm 24.5 cm Vascular Assessment Pulses: Dorsalis Pedis Palpable: [Left:Yes] [Right:No] Electronic Signature(s) Signed: 12/17/2021 5:42:44 PM By: Baruch Gouty RN, BSN Entered By: Baruch Gouty on 12/17/2021 10:07:09 -------------------------------------------------------------------------------- Multi Wound  Chart Details Patient Name: Date of Service: [name], [name] 12/17/2021 9:00 [facility] Record Number: [id number] Patient Account Number: [id number] Date of Birth/Sex: Treating RN: 15-Nov-1951 (71 y.o. [name] Primary Care Ziyon Soltau: PA [name], [place] Other Clinician: Referring Lynnetta Tom: Treating Jabar Krysiak/Extender: Marylin Crosby Weeks in Treatment: 0 Vital Signs Height(in): 75 Capillary Blood Glucose(mg/dl): 111 Weight(lbs): 245 Pulse(bpm): 85 Body Mass Index(BMI): 31 Blood Pressure(mmHg): 109/40 Temperature(F): 98.7 Respiratory Rate(breaths/min): 18 Photos: [N/A:N/A] Left T - Web between  2nd and 3rd oe N/A N/A Wound Location: Gradually Appeared N/A N/A Wounding Event: Diabetic Wound/Ulcer of the Lower N/A N/A Primary Etiology: Extremity Cataracts, Lymphedema, Arrhythmia, N/A N/A Comorbid History: Peripheral Venous Disease, Type II Diabetes, Osteoarthritis, Neuropathy 12/14/2021 N/A N/A Date Acquired: 0 N/A N/A Weeks of Treatment: Open N/A N/A Wound Status: 2x0.8x0.1 N/A N/A Measurements L x W x D (cm) 1.257 N/A N/A A (cm) : rea 0.126 N/A N/A Volume (cm) : 0.00% N/A N/A % Reduction in A rea: 0.00% N/A N/A % Reduction in Volume: Grade 1 N/A N/A Classification: Medium N/A N/A Exudate A mount: Serous N/A N/A Exudate Type: amber N/A N/A Exudate Color: Flat and Intact N/A N/A Wound Margin: Medium (34-66%) N/A N/A Granulation A mount: Pink N/A N/A Granulation Quality: Medium (34-66%) N/A N/A Necrotic A mount: Fat Layer (Subcutaneous Tissue): Yes N/A N/A Exposed Structures: Fascia: No Tendon: No Muscle: No Joint: No Bone: No Small (1-33%) N/A N/A Epithelialization: Treatment Notes Electronic Signature(s) Signed: 12/17/2021 4:38:12 PM By: Linton Ham MD Signed: 12/17/2021 5:48:30 PM By: Levan Hurst RN, BSN Entered By: Linton Ham on 12/17/2021  12:08:44 -------------------------------------------------------------------------------- Multi-Disciplinary Care Plan Details Patient Name: Date of Service: [name], [name] 12/17/2021 9:00 [facility] Record Number: [id number] Patient Account Number: [id number] Date of Birth/Sex: Treating RN: December 01, 1951 (71 y.o. [name] Primary Care Joua Bake: PA [name], [place] Other Clinician: Referring Aubrianna Orchard: Treating Aslan Montagna/Extender: Allena Katz in Treatment: 0 Multidisciplinary Care Plan reviewed with physician Active Inactive Nutrition Nursing Diagnoses: Impaired glucose control: actual or potential Potential for alteratiion in Nutrition/Potential for imbalanced nutrition Goals: Patient/caregiver will maintain therapeutic glucose control Date Initiated: 12/17/2021 Target Resolution Date: 01/14/2022 Goal Status: Active Interventions: Assess HgA1c results as ordered upon admission and as needed Assess patient nutrition upon admission and as needed per policy Provide education on elevated blood sugars and impact on wound healing Provide education on nutrition Treatment Activities: Patient referred to Primary Care Physician for further nutritional evaluation : 12/17/2021 Notes: Wound/Skin Impairment Nursing Diagnoses: Impaired tissue integrity Knowledge deficit related to ulceration/compromised skin integrity Goals: Patient/caregiver will verbalize understanding of skin care regimen Date Initiated: 12/17/2021 Target Resolution Date: 01/15/2022 Goal Status: Active Ulcer/skin breakdown will have a volume reduction of 30% by week 4 Date Initiated: 12/17/2021 Target Resolution Date: 01/14/2022 Goal Status: Active Interventions: Assess patient/caregiver ability to obtain necessary supplies Assess patient/caregiver ability to perform ulcer/skin care regimen upon admission and as needed Assess ulceration(s) every visit Provide education on ulcer and skin  care Treatment Activities: Skin care regimen initiated : 12/17/2021 Topical wound management initiated : 12/17/2021 Notes: Electronic Signature(s) Signed: 12/17/2021 5:42:44 PM By: Baruch Gouty RN, BSN Entered By: Baruch Gouty on 12/17/2021 16:45:50 -------------------------------------------------------------------------------- Pain Assessment Details Patient Name: Date of Service: [name], [name] 12/17/2021 9:00 [facility] Record Number: [id number] Patient Account Number: [id number] Date of Birth/Sex: Treating RN: 09-29-1951 (71 y.o. [name] Primary Care Kynley Metzger: PA [name], [place] Other Clinician: Referring Nargis Abrams: Treating Ferrin Liebig/Extender: Allena Katz in Treatment: 0 Active Problems Location of Pain Severity and Description of Pain Patient Has Paino No Site Locations Rate the pain. Current Pain Level: 0 Pain Management and Medication Current Pain Management: Electronic Signature(s) Signed: 12/17/2021 5:42:44 PM By: Baruch Gouty RN, BSN Entered By: Baruch Gouty on 12/17/2021 10:18:30 -------------------------------------------------------------------------------- Patient/Caregiver Education Details Patient Name: Date of Service: [name], [name] 1/5/2023andnbsp9:00 [place] Record Number: [id number] Patient Account Number: [id number] Date of  Birth/Gender: Treating RN: 11/02/1951 (71 y.o. [name] Primary Care Physician: PA [name], [place] Other Clinician: Referring Physician: Treating Physician/Extender: Allena Katz in Treatment: 0 Education Assessment Education Provided To: Patient Education Topics Provided Venous: Methods: Explain/Verbal Responses: Reinforcements needed, State content correctly Wound/Skin Impairment: Methods: Explain/Verbal Responses: Reinforcements needed, State content correctly Electronic Signature(s) Signed: 12/17/2021 5:42:44 PM By: Baruch Gouty RN, BSN Entered  By: Baruch Gouty on 12/17/2021 10:28:23 -------------------------------------------------------------------------------- Wound Assessment Details Patient Name: Date of Service: [name], [name] 12/17/2021 9:00 [facility] Record Number: [id number] Patient Account Number: [id number] Date of Birth/Sex: Treating RN: 1951/09/30 (71 y.o. [name] Primary Care Kortlyn Koltz: PA [name], [place] Other Clinician: Referring Tuck Dulworth: Treating Latishia Suitt/Extender: Marylin Crosby Weeks in Treatment: 0 Wound Status Wound Number: 8 Primary Diabetic Wound/Ulcer of the Lower Extremity Etiology: Wound Location: Left T - Web between 2nd and 3rd oe Wound Open Wounding Event: Gradually Appeared Status: Date Acquired: 12/14/2021 Comorbid Cataracts, Lymphedema, Arrhythmia, Peripheral Venous Disease, Weeks Of Treatment: 0 History: Type II Diabetes, Osteoarthritis, Neuropathy Clustered Wound: No Photos Wound Measurements Length: (cm) 2 Width: (cm) 0.8 Depth: (cm) 0.1 Area: (cm) 1.257 Volume: (cm) 0.126 % Reduction in Area: 0% % Reduction in Volume: 0% Epithelialization: Small (1-33%) Tunneling: No Undermining: No Wound Description Classification: Grade 1 Wound Margin: Flat and Intact Exudate Amount: Medium Exudate Type: Serous Exudate Color: amber Foul Odor After Cleansing: No Slough/Fibrino No Wound Bed Granulation Amount: Medium (34-66%) Exposed Structure Granulation Quality: Pink Fascia Exposed: No Necrotic Amount: Medium (34-66%) Fat Layer (Subcutaneous Tissue) Exposed: Yes Necrotic Quality: Adherent Slough Tendon Exposed: No Muscle Exposed: No Joint Exposed: No Bone Exposed: No Electronic Signature(s) Signed: 12/17/2021 5:42:44 PM By: Baruch Gouty RN, BSN Signed: 12/18/2021 11:40:24 AM By: [name] RN Entered By: [name] on 12/17/2021 10:35:04 -------------------------------------------------------------------------------- Vitals  Details Patient Name: Date of Service: [name], [name] 12/17/2021 9:00 A [facility] Medical Record Number: [id number] Patient Account Number: [id number] Date of Birth/Sex: Treating RN: 1951/11/29 (71 y.o. [name] Primary Care Eriverto Byrnes: PA [name], [place] Other Clinician: Referring Kalev Temme: Treating Paitynn Mikus/Extender: Allena Katz in Treatment: 0 Vital Signs Time Taken: 09:51 Temperature (F): 98.7 Height (in): 75 Pulse (bpm): 47 Source: Stated Respiratory Rate (breaths/min): 18 Weight (lbs): 245 Blood Pressure (mmHg): 109/40 Source: Stated Capillary Blood Glucose (mg/dl): 111 Body Mass Index (BMI): 30.6 Reference Range: 80 - 120 mg / dl Notes glucose per pt report this am Electronic Signature(s) Signed: 12/17/2021 5:42:44 PM By: Baruch Gouty RN, BSN Entered By: Baruch Gouty on 12/17/2021 09:53:23

## 2021-12-22 ENCOUNTER — Encounter (HOSPITAL_COMMUNITY): Payer: Self-pay | Admitting: Emergency Medicine

## 2021-12-22 ENCOUNTER — Other Ambulatory Visit: Payer: Self-pay

## 2021-12-22 ENCOUNTER — Inpatient Hospital Stay (HOSPITAL_COMMUNITY)
Admission: EM | Admit: 2021-12-22 | Discharge: 2021-12-25 | DRG: 310 | Disposition: A | Discharge status: Nursing Home (Includes ICF) | Payer: Medicare Other | Source: Skilled Nursing Facility | Attending: Internal Medicine | Admitting: Internal Medicine

## 2021-12-22 ENCOUNTER — Emergency Department (HOSPITAL_COMMUNITY): Payer: Medicare Other

## 2021-12-22 DIAGNOSIS — Z20822 Contact with and (suspected) exposure to covid-19: Secondary | ICD-10-CM | POA: Diagnosis present

## 2021-12-22 DIAGNOSIS — E785 Hyperlipidemia, unspecified: Secondary | ICD-10-CM | POA: Diagnosis present

## 2021-12-22 DIAGNOSIS — Z85038 Personal history of other malignant neoplasm of large intestine: Secondary | ICD-10-CM

## 2021-12-22 DIAGNOSIS — Z9049 Acquired absence of other specified parts of digestive tract: Secondary | ICD-10-CM

## 2021-12-22 DIAGNOSIS — I890 Lymphedema, not elsewhere classified: Secondary | ICD-10-CM | POA: Diagnosis present

## 2021-12-22 DIAGNOSIS — Z87891 Personal history of nicotine dependence: Secondary | ICD-10-CM

## 2021-12-22 DIAGNOSIS — R001 Bradycardia, unspecified: Secondary | ICD-10-CM | POA: Diagnosis present

## 2021-12-22 DIAGNOSIS — R1013 Epigastric pain: Secondary | ICD-10-CM | POA: Diagnosis not present

## 2021-12-22 DIAGNOSIS — F419 Anxiety disorder, unspecified: Secondary | ICD-10-CM | POA: Diagnosis present

## 2021-12-22 DIAGNOSIS — N400 Enlarged prostate without lower urinary tract symptoms: Secondary | ICD-10-CM | POA: Diagnosis present

## 2021-12-22 DIAGNOSIS — I4891 Unspecified atrial fibrillation: Principal | ICD-10-CM | POA: Diagnosis present

## 2021-12-22 DIAGNOSIS — Z8249 Family history of ischemic heart disease and other diseases of the circulatory system: Secondary | ICD-10-CM

## 2021-12-22 DIAGNOSIS — K5900 Constipation, unspecified: Secondary | ICD-10-CM | POA: Diagnosis present

## 2021-12-22 DIAGNOSIS — I452 Bifascicular block: Secondary | ICD-10-CM | POA: Diagnosis present

## 2021-12-22 DIAGNOSIS — Z888 Allergy status to other drugs, medicaments and biological substances status: Secondary | ICD-10-CM

## 2021-12-22 DIAGNOSIS — Z79899 Other long term (current) drug therapy: Secondary | ICD-10-CM

## 2021-12-22 DIAGNOSIS — L309 Dermatitis, unspecified: Secondary | ICD-10-CM | POA: Diagnosis present

## 2021-12-22 DIAGNOSIS — Z7984 Long term (current) use of oral hypoglycemic drugs: Secondary | ICD-10-CM

## 2021-12-22 DIAGNOSIS — D539 Nutritional anemia, unspecified: Secondary | ICD-10-CM | POA: Diagnosis present

## 2021-12-22 DIAGNOSIS — Z7901 Long term (current) use of anticoagulants: Secondary | ICD-10-CM

## 2021-12-22 DIAGNOSIS — I10 Essential (primary) hypertension: Secondary | ICD-10-CM | POA: Diagnosis present

## 2021-12-22 DIAGNOSIS — Z6832 Body mass index (BMI) 32.0-32.9, adult: Secondary | ICD-10-CM

## 2021-12-22 DIAGNOSIS — E119 Type 2 diabetes mellitus without complications: Secondary | ICD-10-CM | POA: Diagnosis present

## 2021-12-22 LAB — CBC WITH DIFFERENTIAL/PLATELET
Abs Immature Granulocytes: 0.02 10*3/uL (ref 0.00–0.07)
Basophils Absolute: 0 10*3/uL (ref 0.0–0.1)
Basophils Relative: 0 %
Eosinophils Absolute: 0.1 10*3/uL (ref 0.0–0.5)
Eosinophils Relative: 2 %
HCT: 30 % — ABNORMAL LOW (ref 39.0–52.0)
Hemoglobin: 10 g/dL — ABNORMAL LOW (ref 13.0–17.0)
Immature Granulocytes: 0 %
Lymphocytes Relative: 7 %
Lymphs Abs: 0.5 10*3/uL — ABNORMAL LOW (ref 0.7–4.0)
MCH: 35.6 pg — ABNORMAL HIGH (ref 26.0–34.0)
MCHC: 33.3 g/dL (ref 30.0–36.0)
MCV: 106.8 fL — ABNORMAL HIGH (ref 80.0–100.0)
Monocytes Absolute: 0.2 10*3/uL (ref 0.1–1.0)
Monocytes Relative: 3 %
Neutro Abs: 5.6 10*3/uL (ref 1.7–7.7)
Neutrophils Relative %: 88 %
Platelets: 156 10*3/uL (ref 150–400)
RBC: 2.81 MIL/uL — ABNORMAL LOW (ref 4.22–5.81)
RDW: 15.8 % — ABNORMAL HIGH (ref 11.5–15.5)
WBC: 6.3 10*3/uL (ref 4.0–10.5)
nRBC: 0 % (ref 0.0–0.2)

## 2021-12-22 LAB — RESP PANEL BY RT-PCR (FLU A&B, COVID) ARPGX2
Influenza A by PCR: NEGATIVE
Influenza B by PCR: NEGATIVE
SARS Coronavirus 2 by RT PCR: NEGATIVE

## 2021-12-22 LAB — COMPREHENSIVE METABOLIC PANEL
ALT: 14 U/L (ref 0–44)
AST: 16 U/L (ref 15–41)
Albumin: 3.4 g/dL — ABNORMAL LOW (ref 3.5–5.0)
Alkaline Phosphatase: 80 U/L (ref 38–126)
Anion gap: 8 (ref 5–15)
BUN: 22 mg/dL (ref 8–23)
CO2: 23 mmol/L (ref 22–32)
Calcium: 8.5 mg/dL — ABNORMAL LOW (ref 8.9–10.3)
Chloride: 103 mmol/L (ref 98–111)
Creatinine, Ser: 1.09 mg/dL (ref 0.61–1.24)
GFR, Estimated: >60 mL/min (ref >60)
Glucose, Bld: 161 mg/dL — ABNORMAL HIGH (ref 70–99)
Potassium: 4.4 mmol/L (ref 3.5–5.1)
Sodium: 134 mmol/L — ABNORMAL LOW (ref 135–145)
Total Bilirubin: 0.8 mg/dL (ref 0.3–1.2)
Total Protein: 7.3 g/dL (ref 6.5–8.1)

## 2021-12-22 LAB — CBC
HCT: 35.7 % — ABNORMAL LOW (ref 39.0–52.0)
Hemoglobin: 11.4 g/dL — ABNORMAL LOW (ref 13.0–17.0)
MCH: 35.5 pg — ABNORMAL HIGH (ref 26.0–34.0)
MCHC: 31.9 g/dL (ref 30.0–36.0)
MCV: 111.2 fL — ABNORMAL HIGH (ref 80.0–100.0)
Platelets: 158 10*3/uL (ref 150–400)
RBC: 3.21 MIL/uL — ABNORMAL LOW (ref 4.22–5.81)
RDW: 15.8 % — ABNORMAL HIGH (ref 11.5–15.5)
WBC: 6.6 10*3/uL (ref 4.0–10.5)
nRBC: 0 % (ref 0.0–0.2)

## 2021-12-22 LAB — TROPONIN I (HIGH SENSITIVITY)
Troponin I (High Sensitivity): 11 ng/L (ref <18)
Troponin I (High Sensitivity): 10 ng/L (ref <18)

## 2021-12-22 LAB — VITAMIN B12: Vitamin B-12: 164 pg/mL — ABNORMAL LOW (ref 180–914)

## 2021-12-22 LAB — GLUCOSE, CAPILLARY
Glucose-Capillary: 129 mg/dL — ABNORMAL HIGH (ref 70–99)
Glucose-Capillary: 150 mg/dL — ABNORMAL HIGH (ref 70–99)

## 2021-12-22 LAB — DIGOXIN LEVEL: Digoxin Level: 1.1 ng/mL (ref 0.8–2.0)

## 2021-12-22 LAB — HIV ANTIBODY (ROUTINE TESTING W REFLEX): HIV Screen 4th Generation wRfx: NONREACTIVE

## 2021-12-22 LAB — CREATININE, SERUM
Creatinine, Ser: 0.9 mg/dL (ref 0.61–1.24)
GFR, Estimated: >60 mL/min (ref >60)

## 2021-12-22 LAB — HEMOGLOBIN A1C
Hgb A1c MFr Bld: 5.5 % (ref 4.8–5.6)
Mean Plasma Glucose: 111.15 mg/dL

## 2021-12-22 LAB — MRSA NEXT GEN BY PCR, NASAL: MRSA by PCR Next Gen: NOT DETECTED

## 2021-12-22 LAB — FOLATE: Folate: 31.2 ng/mL (ref >5.9)

## 2021-12-22 LAB — LIPASE, BLOOD: Lipase: 40 U/L (ref 11–51)

## 2021-12-22 MED ORDER — ONDANSETRON HCL 4 MG/2ML IJ SOLN: 4.0000 mg | Freq: Four times a day (QID) | INTRAMUSCULAR | Status: DC | PRN

## 2021-12-22 MED ORDER — FOLIC ACID 1 MG PO TABS
1.0000 mg | ORAL_TABLET | ORAL | Status: DC
  Administered 2021-12-22 – 2021-12-24 (×3): 1 mg via ORAL
  Filled 2021-12-22 (×3): qty 1

## 2021-12-22 MED ORDER — POLYETHYLENE GLYCOL 3350 17 G PO PACK
17.0000 g | PACK | Freq: Every day | ORAL | Status: DC
  Administered 2021-12-23 – 2021-12-24 (×2): 17 g via ORAL
  Filled 2021-12-22 (×4): qty 1

## 2021-12-22 MED ORDER — ACETAMINOPHEN 325 MG PO TABS
650.0000 mg | ORAL_TABLET | Freq: Four times a day (QID) | ORAL | Status: DC | PRN
  Administered 2021-12-25: 650 mg via ORAL
  Filled 2021-12-22: qty 2

## 2021-12-22 MED ORDER — HYDROXYZINE HCL 25 MG PO TABS
25.0000 mg | ORAL_TABLET | Freq: Three times a day (TID) | ORAL | Status: DC | PRN
  Administered 2021-12-22: 25 mg via ORAL
  Filled 2021-12-22: qty 1

## 2021-12-22 MED ORDER — HYDRALAZINE HCL 20 MG/ML IJ SOLN: 10.0000 mg | Freq: Three times a day (TID) | INTRAMUSCULAR | Status: DC | PRN

## 2021-12-22 MED ORDER — ENOXAPARIN SODIUM 60 MG/0.6ML IJ SOSY
60.0000 mg | PREFILLED_SYRINGE | INTRAMUSCULAR | Status: DC
  Administered 2021-12-22 – 2021-12-23 (×2): 60 mg via SUBCUTANEOUS
  Filled 2021-12-22 (×2): qty 0.6

## 2021-12-22 MED ORDER — ATORVASTATIN CALCIUM 20 MG PO TABS
20.0000 mg | ORAL_TABLET | Freq: Every day | ORAL | Status: DC
  Administered 2021-12-23 – 2021-12-25 (×3): 20 mg via ORAL
  Filled 2021-12-22 (×3): qty 1

## 2021-12-22 MED ORDER — IOHEXOL 300 MG/ML  SOLN
100.0000 mL | Freq: Once | INTRAMUSCULAR | Status: AC | PRN
  Administered 2021-12-22: 100 mL via INTRAVENOUS

## 2021-12-22 MED ORDER — NYSTATIN 100000 UNIT/GM EX POWD
1.0000 "application " | Freq: Two times a day (BID) | CUTANEOUS | Status: DC
  Administered 2021-12-24 – 2021-12-25 (×3): 1 via TOPICAL
  Filled 2021-12-22: qty 15

## 2021-12-22 MED ORDER — INSULIN ASPART 100 UNIT/ML IJ SOLN
0.0000 [IU] | Freq: Three times a day (TID) | INTRAMUSCULAR | Status: DC
  Administered 2021-12-23 (×2): 3 [IU] via SUBCUTANEOUS
  Administered 2021-12-24 – 2021-12-25 (×4): 2 [IU] via SUBCUTANEOUS

## 2021-12-22 MED ORDER — TAMSULOSIN HCL 0.4 MG PO CAPS
0.4000 mg | ORAL_CAPSULE | Freq: Every day | ORAL | Status: DC
  Administered 2021-12-22 – 2021-12-25 (×4): 0.4 mg via ORAL
  Filled 2021-12-22 (×4): qty 1

## 2021-12-22 MED ORDER — FENTANYL CITRATE PF 50 MCG/ML IJ SOSY
50.0000 ug | PREFILLED_SYRINGE | Freq: Once | INTRAMUSCULAR | Status: AC
  Administered 2021-12-22: 50 ug via INTRAVENOUS
  Filled 2021-12-22: qty 1

## 2021-12-22 MED ORDER — ONDANSETRON HCL 4 MG PO TABS: 4.0000 mg | ORAL_TABLET | Freq: Four times a day (QID) | ORAL | Status: DC | PRN

## 2021-12-22 MED ORDER — DOCUSATE SODIUM 100 MG PO CAPS
100.0000 mg | ORAL_CAPSULE | Freq: Two times a day (BID) | ORAL | Status: DC
  Administered 2021-12-23 – 2021-12-25 (×5): 100 mg via ORAL
  Filled 2021-12-22 (×6): qty 1

## 2021-12-22 MED ORDER — INSULIN ASPART 100 UNIT/ML IJ SOLN: 0.0000 [IU] | Freq: Every day | INTRAMUSCULAR | Status: DC

## 2021-12-22 MED ORDER — PANTOPRAZOLE SODIUM 40 MG PO TBEC
40.0000 mg | DELAYED_RELEASE_TABLET | Freq: Every day | ORAL | Status: DC
  Administered 2021-12-22 – 2021-12-25 (×4): 40 mg via ORAL
  Filled 2021-12-22 (×4): qty 1

## 2021-12-22 MED ORDER — ACETAMINOPHEN 650 MG RE SUPP: 650.0000 mg | Freq: Four times a day (QID) | RECTAL | Status: DC | PRN

## 2021-12-22 NOTE — Consult Note (Signed)
CONSULTATION NOTE   Patient Name: [name] Date of Encounter: 12/22/2021 Cardiologist: None Electrophysiologist: None Advanced Heart Failure: None   Chief Complaint   Abdominal pain  Patient Profile   71 yo male with nausea, vomiting and abdominal pain, noted to bradycardic in the 30's in afib  HPI   [name] is a 71 y.o. male who is being seen today for the evaluation of bradycardia at the request of Dr. Karle Starch. This is a gentleman that I initially saw in the office in 2014 for atrial fibrillation.  He had moved from [place] to this area and wished to establish care with a new cardiologist. He was seen by Dr. Brigitte Pulse and urgent medical care and kindly referred to our practice. Unfortunately we do not have records at this time to review, but he does say that he has some records at home that we could make copies of. We will also attempt to get records from his cardiologist in [place]. Per Mr. Clemons his report, he has a history of atrial fibrillation which he says he's had for years. He always knew he had palpitations but prior to a cataract surgery he was told he was in atrial fibrillation and was formally referred to a cardiologist. He was started on Eliquis do to a number of risk factors and a high CHADS2VASC score. He is also has morbid obesity, hypertension, dyslipidemia, diabetes type 2 and a distant family history of heart disease.  He reports he had an echocardiogram at his cardiologist's office and was told that his heart is "unusually strong" for atrial fibrillation.  At the time, at the time, I had cleared him for cataract surgery.  I had recommended a split-night sleep study due to concern for obstructive sleep apnea. He was on metoprolol 75 mg BID.  He was lost to follow-up and I have not seen him since 2014.  He now presents with generalized abdominal pain and was found to have bradycardia.  According to EMS heart rate was in the 30s and 40s.  He is on metoprolol and  at some point was started on digoxin.  EKG shows A. fib with slow ventricular response and bifascicular block at 45.  Digoxin level was 1.1.  Troponin is negative x2.  Potassium was normal at 4.4, creatinine normal 1.09.  CT of the abdomen showed cholelithiasis without definite cholecystitis.  This finding was confirmed by abdominal ultrasound.  PMHx   Past Medical History:  Diagnosis Date   Atrial fibrillation ([place])    Cataract    Diabetes mellitus without complication ([place])    Hyperlipidemia    Hypertension    Lichen planus    bilateral legs    Past Surgical History:  Procedure Laterality Date   APPENDECTOMY     COLON RESECTION N/A 11/22/2016   Procedure: HAND ASSISTED LAPAROSCOPIC COLON RESECTION;  Surgeon: [name], MD;  Location: [facility];  Service: General;  Laterality: N/A;   COLONOSCOPY N/A 11/17/2016   Procedure: COLONOSCOPY;  Surgeon: [name], MD;  Location: [facility];  Service: Endoscopy;  Laterality: N/A;   FRACTURE SURGERY     MULTIPLE TOOTH EXTRACTIONS     RECTAL EXAM UNDER ANESTHESIA N/A 12/05/2016   Procedure: RECTAL EXAM UNDER ANESTHESIA, DISIMPACTION;  Surgeon: Alphonsa Overall, MD;  Location: [facility];  Service: General;  Laterality: N/A;    FAMHx   Family History  Problem Relation Age of Onset   Brain cancer Mother    Alzheimer's disease Father  Cancer Maternal Grandmother    Heart attack Maternal Grandfather    Pneumonia Paternal Grandfather     SOCHx    reports that he quit smoking about 38 years ago. His smoking use included pipe. He has never used smokeless tobacco. He reports that he does not drink alcohol and does not use drugs.  Outpatient Medications   No current facility-administered medications on file prior to encounter.   Current Outpatient Medications on File Prior to Encounter  Medication Sig Dispense Refill   atorvastatin (LIPITOR) 20 MG tablet Take 20 mg by mouth daily.     dextrose (GLUTOSE) 40 % GEL Take 1 Tube by  mouth as needed for low blood sugar.     digoxin (LANOXIN) 0.25 MG tablet Take 250 mcg by mouth daily.     docusate sodium (COLACE) 100 MG capsule Take 1 capsule (100 mg total) by mouth 2 (two) times daily.     folic acid (FOLVITE) 1 MG tablet Take 1 mg by mouth See admin instructions. 1mg  everyday EXCEPT none on Fridays.     glipiZIDE (GLUCOTROL) 10 MG tablet Take 10 mg by mouth 2 (two) times daily before a meal.     hydrOXYzine (ATARAX/VISTARIL) 25 MG tablet Take 1 tablet (25 mg total) by mouth every 8 (eight) hours as needed for itching. Use caution as this medication may make you drowsy 10 tablet 0   JANUVIA 100 MG tablet Take 100 mg by mouth daily.     metFORMIN (GLUCOPHAGE) 1000 MG tablet Take 1,000 mg by mouth 2 (two) times daily with a meal.     methotrexate (RHEUMATREX) 2.5 MG tablet Take 10 mg by mouth once a week. Takes on Fridays.     metoprolol tartrate (LOPRESSOR) 25 MG tablet Take 12.5 mg by mouth 2 (two) times daily.     miconazole (MICOTIN) 2 % powder Apply 1 application topically See admin instructions. 1 application topical twice daily And 1 application daily to groin, stomach, thighs, buttocks area for chafing.     polyethylene glycol (MIRALAX) packet Take 17 g by mouth daily. Hold for diarrhea     PRESCRIPTION MEDICATION Apply 1 application topically daily. Tac 0.1% Cetaphil 1:4 Apply to lower legs     tamsulosin (FLOMAX) 0.4 MG CAPS capsule Take 1 capsule (0.4 mg total) by mouth daily.     triamcinolone (KENALOG) 0.1 % Apply 1 application topically See admin instructions. 1 application topical twice daily to itchy areas And 1 extra application daily as needed for dermatitis     busPIRone (BUSPAR) 7.5 MG tablet Take 1 tablet (7.5 mg total) by mouth 3 (three) times daily. (Patient not taking: Reported on 02/18/2021)     digoxin (LANOXIN) 0.125 MG tablet Take 1 tablet (0.125 mg total) by mouth daily. (Patient not taking: Reported on 02/18/2021)     Metoprolol Tartrate 75 MG TABS  Take 75 mg by mouth 2 (two) times daily. (Patient not taking: Reported on 02/18/2021)      Inpatient Medications    Scheduled Meds:   Continuous Infusions:   PRN Meds:    ALLERGIES   Allergies  Allergen Reactions   Other     [name] bandage (self-gripping) - erythema, itching    ROS   Pertinent items noted in HPI and remainder of comprehensive ROS otherwise negative.  Vitals   Vitals:   12/22/21 1030 12/22/21 1103 12/22/21 1240 12/22/21 1332  BP: (!) 128/50 114/73 (!) 131/59 135/65  Pulse: (!) 46 (!) 46 [name])  44 (!) 49  Resp: (!) 22 20 19 14   Temp:      TempSrc:      SpO2: 95% 99% 100% 100%  Weight:      Height:       No intake or output data in the 24 hours ending 12/22/21 1513 Filed Weights   12/22/21 0910  Weight: 117.9 kg    Physical Exam   General appearance: alert and no distress Neck: no carotid bruit, no JVD, and thyroid not enlarged, symmetric, no tenderness/mass/nodules Lungs: clear to auscultation bilaterally Heart: irregularly irregular rhythm and bradycardic Abdomen: Soft, nontender Extremities: extremities normal, atraumatic, no cyanosis or edema Pulses: 2+ and symmetric Skin: Skin color, texture, turgor normal. No rashes or lesions Neurologic: Grossly normal Psych: Pleasant  Labs   Results for orders placed or performed during the hospital encounter of 12/22/21 (from the past 48 hour(s))  Comprehensive metabolic panel     Status: Abnormal   Collection Time: 12/22/21  9:18 AM  Result Value Ref Range   Sodium 134 (L) 135 - 145 mmol/L   Potassium 4.4 3.5 - 5.1 mmol/L   Chloride 103 98 - 111 mmol/L   CO2 23 22 - 32 mmol/L   Glucose, Bld 161 (H) 70 - 99 mg/dL    Comment: Glucose reference range applies only to samples taken after fasting for at least 8 hours.   BUN 22 8 - 23 mg/dL   Creatinine, Ser 1.09 0.61 - 1.24 mg/dL   Calcium 8.5 (L) 8.9 - 10.3 mg/dL   Total Protein 7.3 6.5 - 8.1 g/dL   Albumin 3.4 (L) 3.5 - 5.0 g/dL    AST 16 15 - 41 U/L   ALT 14 0 - 44 U/L   Alkaline Phosphatase 80 38 - 126 U/L   Total Bilirubin 0.8 0.3 - 1.2 mg/dL   GFR, Estimated >60 >60 mL/min    Comment: (NOTE) Calculated using the CKD-EPI Creatinine Equation (2021)    Anion gap 8 5 - 15    Comment: Performed at [facility], [place] [address] [place], [place] [address]  Lipase, blood     Status: None   Collection Time: 12/22/21  9:18 AM  Result Value Ref Range   Lipase 40 11 - 51 U/L    Comment: Performed at [facility], [place] [address], [place], [place] [address]  CBC with Differential     Status: Abnormal   Collection Time: 12/22/21  9:18 AM  Result Value Ref Range   WBC 6.3 4.0 - 10.5 K/uL   RBC 2.81 (L) 4.22 - 5.81 MIL/uL   Hemoglobin 10.0 (L) 13.0 - 17.0 g/dL   HCT 30.0 (L) 39.0 - 52.0 %   MCV 106.8 (H) 80.0 - 100.0 fL   MCH 35.6 (H) 26.0 - 34.0 pg   MCHC 33.3 30.0 - 36.0 g/dL   RDW 15.8 (H) 11.5 - 15.5 %   Platelets 156 150 - 400 K/uL   nRBC 0.0 0.0 - 0.2 %   Neutrophils Relative % 88 %   Neutro Abs 5.6 1.7 - 7.7 K/uL   Lymphocytes Relative 7 %   Lymphs Abs 0.5 (L) 0.7 - 4.0 K/uL   Monocytes Relative 3 %   Monocytes Absolute 0.2 0.1 - 1.0 K/uL   Eosinophils Relative 2 %   Eosinophils Absolute 0.1 0.0 - 0.5 K/uL   Basophils Relative 0 %   Basophils Absolute 0.0 0.0 - 0.1 K/uL   Immature Granulocytes 0 %  Abs Immature Granulocytes 0.02 0.00 - 0.07 K/uL    Comment: Performed at [facility], [place] [address], [place], [place] [address]  Troponin I (High Sensitivity)     Status: None   Collection Time: 12/22/21  9:18 AM  Result Value Ref Range   Troponin I (High Sensitivity) 11 <18 ng/L    Comment: (NOTE) Elevated high sensitivity troponin I (hsTnI) values and significant  changes across serial measurements may suggest ACS but many other  chronic and acute conditions are known to elevate hsTnI results.  Refer to the "Links" section for chest pain  algorithms and additional  guidance. Performed at [facility], [place] [address], [place], [place] [address]   Digoxin level     Status: None   Collection Time: 12/22/21  9:18 AM  Result Value Ref Range   Digoxin Level 1.1 0.8 - 2.0 ng/mL    Comment: Performed at [facility], [place] [address] [place], [place] [address]  Troponin I (High Sensitivity)     Status: None   Collection Time: 12/22/21 12:57 PM  Result Value Ref Range   Troponin I (High Sensitivity) 10 <18 ng/L    Comment: (NOTE) Elevated high sensitivity troponin I (hsTnI) values and significant  changes across serial measurements may suggest ACS but many other  chronic and acute conditions are known to elevate hsTnI results.  Refer to the "Links" section for chest pain algorithms and additional  guidance. Performed at [facility], [place] [address], [place],  [address]     ECG   A. fib at 45, bifascicular block- Personally Reviewed  Telemetry   A. fib with slow ventricular response- Personally Reviewed  Radiology   CT Abdomen Pelvis W Contrast  Result Date: 12/22/2021 CLINICAL DATA:  Epigastric abdominal pain. EXAM: CT ABDOMEN AND PELVIS WITH CONTRAST TECHNIQUE: Multidetector CT imaging of the abdomen and pelvis was performed using the standard protocol following bolus administration of intravenous contrast. CONTRAST:  161mL OMNIPAQUE IOHEXOL 300 MG/ML  SOLN COMPARISON:  January 26, 2017. FINDINGS: Lower chest: No acute abnormality. Hepatobiliary: Cholelithiasis is noted without biliary dilatation. Liver is unremarkable. Pancreas: Unremarkable. No pancreatic ductal dilatation or surrounding inflammatory changes. Spleen: Normal in size without focal abnormality. Adrenals/Urinary Tract: Adrenal glands appear normal. Probable left parapelvic cysts are noted. No hydronephrosis or renal obstruction is noted. Urinary bladder is unremarkable. Stomach/Bowel: Stomach  is unremarkable. There is no evidence of bowel obstruction or inflammation. Diverticulosis of descending and sigmoid colon is noted without inflammation. Vascular/Lymphatic: Aortic atherosclerosis. No enlarged abdominal or pelvic lymph nodes. Reproductive: Prostate is unremarkable. Other: No abdominal wall hernia or abnormality. No abdominopelvic ascites. Musculoskeletal: No acute or significant osseous findings. IMPRESSION: Cholelithiasis without definite evidence of cholecystitis. Diverticulosis of descending and sigmoid colon without inflammation. No definite acute abnormality seen in the abdomen or pelvis. Aortic Atherosclerosis (ICD10-I70.0). Electronically Signed   By: Marijo Conception M.D.   On: 12/22/2021 11:33   US Abdomen Limited  Result Date: 12/22/2021 CLINICAL DATA:  Epigastric pain, gallstones EXAM: ULTRASOUND ABDOMEN LIMITED RIGHT UPPER QUADRANT COMPARISON:  CT abdomen 12/22/2021, 01/26/2017 FINDINGS: Gallbladder: Cholelithiasis. No gallbladder wall thickening or pericholecystic fluid. Small amount of gallbladder sludge. Negative sonographic Murphy sign. Common bile duct: Diameter: 6.6 mm Liver: No focal lesion identified. Increased hepatic parenchymal echogenicity. Portal vein is patent on color Doppler imaging with normal direction of blood flow towards the liver. Other: None. IMPRESSION: 1. Cholelithiasis without sonographic evidence of acute cholecystitis. 2. Increased  hepatic echogenicity as can be seen with hepatic steatosis. Electronically Signed   By: Kathreen Devoid M.D.   On: 12/22/2021 12:29    Cardiac Studies   None  Impression   Principal Problem:   Symptomatic bradycardia   Recommendation   A. fib with slow ventricular response Mr. Pavlak has A. fib with a slow ventricular response.  At some point he was started on digoxin probably for increased rates but has underlying bifascicular block (RBBB, LAFB).  He likely has developed worsening conduction system disease.  I would  advise holding both his digoxin and metoprolol and monitor heart rate.  I do not suspect digoxin toxicity.  We may be able to add back low-dose metoprolol after several days of washout however the immediate goal is to see if we can avoid placement of a pacemaker. Will order repeat echocardiogram as previous study in 2017 was not interpretable  Thanks for the consultation.  Cardiology will follow with you.  Time Spent Directly with Patient:  I have spent a total of 45 minutes with the patient reviewing hospital notes, telemetry, EKGs, labs and examining the patient as well as establishing an assessment and plan that was discussed personally with the patient.  > 50% of time was spent in direct patient care.  Length of Stay:  LOS: 0 days   Pixie Casino, MD, [facility], [place] Director of the Advanced Lipid Disorders &  Cardiovascular Risk Reduction Clinic Diplomate of the American Board of Clinical Lipidology Attending Cardiologist  Direct Dial: [phone]   Fax: [phone]  Website:  [email or web address] 12/22/2021, 3:13 PM

## 2021-12-22 NOTE — ED Notes (Signed)
Patient transported to CT 

## 2021-12-22 NOTE — [place] Notes (Signed)
Allen, Reese ([id number]) Visit Report for 12/17/2021 Chief Complaint Document Details Patient Name: Date of Service: [name], [name] 12/17/2021 9:00 [place] Record Number: [id number] Patient Account Number: [id number] Date of Birth/Sex: Treating RN: [birth year] (71 y.o. [name] Primary Care Provider: PA [name], NO Other Clinician: Referring Provider: Treating Provider/Extender: Allena Katz in Treatment: 0 Information Obtained from: Patient Chief Complaint bilateral lower extremity swelling with wounds 12/17/2021; patient returns to clinic out of concern for problems with his lower legs and feet. Electronic Signature(s) Signed: 12/17/2021 4:38:12 PM By: Linton Ham MD Entered By: Linton Ham on 12/17/2021 12:09:23 -------------------------------------------------------------------------------- HPI Details Patient Name: Date of Service: [name], [name] 12/17/2021 9:00 [place] Record Number: [id number] Patient Account Number: [id number] Date of Birth/Sex: Treating RN: May 02, 1951 (71 y.o. [name] Primary Care Provider: PA [name], NO Other Clinician: Referring Provider: Treating Provider/Extender: Allena Katz in Treatment: 0 History of Present Illness HPI Description: [name] is a 71 year old male with a past medical history of type 2 diabetes and essential hypertension who presents to the clinic today for bilateral lower extremity wounds. He was recently seen in the ED on 3/9 for bilateral lower extremity edema. He had a DVT study that was negative for clots. He was given triamcinolone cream and advised to elevate legs and obtain compression stockings. He has been using the triamcinolone cream however he is unable to use compression stockings due to difficulty of putting on. Patient states that for the past 2 years he has had opening and closing of weeping wounds to his legs bilaterally. They  spontaneously open and close. They are often open for several weeks before they heal spontaneously. He denies any purulent drainage, increased warmth or increased erythema to the skin. 4/21; patient presents for 1 week follow-up. The wounds on the right leg have healed however he has now 2 new wounds 1 on the anterior and posterior thigh that occurred from him scratching underneath the wrap. The left leg wound has improved and almost closed 4/28; patient presents for 1 week follow-up. He continues to have a wound on the right anterior leg that is showing signs of healing. The posterior right thigh has closed. He now has a new wound to the left leg that is limited to skin breakdown. 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 The wounds are occurring because he goes underneath the leg wraps and scratches creating new wounds. He has no complaints today. 5/5; patient presents for 1 week follow-up. He no longer has any open wounds to his lower extremities bilaterally. He continues to have scattered excoriation marks from where he scratches. These are scabbed over. He states he is going to see dermatology this month. Overall he is doing well and is happy with his care. Readmission 6/10 Patient was followed for open wounds limited to skin breakdown to his lower extremities bilaterally 2/2 venous insufficiency. Once the wounds healed he was discharged with juxta lite compressions and these were placed in office. He was discharged on 5/5. He states that he has not taken the wraps off since discharge and has had them on for over a month. He did not want to take them off because they were very comfortable. He does not have any difficulty putting these on and off if he wanted to. He states that he does not take a shower  and just cleans the other parts of his body at the sink. He noticed that 2 days ago he was having some drainage at the top  of the wrap and called in to be evaluated. He states that he has not seen the wounds until the wraps were taken off today in office. He denies pain or increased warmth or erythema to the area 6/27; patient presents for 2-week follow-up. He has had increased swelling and drainage to the wounds over the last week. He reports increased erythema to the area. He does not have pain however. He denies purulent drainage or fever/chills. 7/5; patient presents for 1 week follow-up. He was prescribed Keflex at last clinic visit and is currently taking this. He reports significant improvement to his leg swelling, tenderness and erythema. He denies signs of infection. 7/21; patient presents for 2-week follow-up. He reports improvement in healing in his right lower extremity. Unfortunately this morning he noticed skin breakdown to his left lower extremity with increased redness and warmth. 7/28; patient presents for 1 week follow-up. He reports completing his antibiotics. He states that his wounds are healed. He has been using his juxta light compressions. He has no issues or complaints today. Readmission 12/17/2021; This is a 71 year old man who has been seen twice by Dr. [name] for wounds on his bilateral lower extremities secondary to chronic venous insufficiency. Most recently this was from 05/22/2021 through 07/09/2021 with bilateral lower extremity wounds largely secondary to chronic venous insufficiency with stasis dermatitis. He has been discharged with juxta lite stockings. The patient tells me that about a month ago he developed too much edema to get his juxta lites on. He developed increased swelling. He saw his primary physician who gave him Lasix and the swelling seems to have come down. 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 He says this dates back into his adolescence and he could not walk barefoot etc. As a result of this he sometimes leaves his  shoes and socks on for weeks and he is done that recently.[name] He arrives in clinic with not any wounds on his legs however with close inspection he had a fairly deep wound in the left second and third toe webspace His skin on his feet was not in good shape macerated flaking malodorous The patient's past medical history has not changed. I note that he had arterial studies done on 06/03/2021 that showed an ABI on the right of 1.26 with triphasic waveforms TBI of 0.63 on the right on the left ABI of 0.85 TBI of 0.63 again with triphasic waveforms Electronic Signature(s) Signed: 12/17/2021 4:38:12 PM By: Linton Ham MD Entered By: Linton Ham on 12/17/2021 12:13:15 -------------------------------------------------------------------------------- Physical Exam Details Patient Name: Date of Service: [name], [name] 12/17/2021 9:00 A M Medical Record Number: [id number] Patient Account Number: [id number] Date of Birth/Sex: Treating RN: September 14, 1951 (71 y.o. [name] Primary Care Provider: PA [name], NO Other Clinician: Referring Provider: Treating Provider/Extender: Marylin Crosby Weeks in Treatment: 0 Constitutional Wide pulse pressure but he is otherwise asymptomatic. Pulse regular and within target range for patient.[name] Respirations regular, non-labored and within target range.. Temperature is normal and within the target range for the patient.[name] Appears in no distress. Respiratory work of breathing is normal. Bilateral breath sounds are clear and equal in all lobes with no wheezes, rales or rhonchi.. Cardiovascular Heart rhythm and rate regular, without murmur or gallop.No evidence of CHF. Pedal pulses  are palpable bilaterally including dorsalis pedis and posterior tibial. Popliteal pulses are also palpable. Edema present in both extremities.However this is nonpitting. He has severe stasis dermatitis with erythema on his bilateral lower legs but no focal tenderness to  suggest active bacterial infection. Notes Wound exam; surprisingly did not have open areas on his legs and he has not worn his juxta lite stockings for a month. As noted that is been the reason for his continued presence in our clinic over the last 2 years with careful inspection of his feet I was able to identify a wound between the left second and third toes this has some relative size and depth. I did not think he had tinea pedis after careful inspection he does not have any symptoms no pain and no pruritus Electronic Signature(s) Signed: 12/17/2021 4:38:12 PM By: Linton Ham MD Entered By: Linton Ham on 12/17/2021 12:15:22 -------------------------------------------------------------------------------- Physician Orders Details Patient Name: Date of Service: [name], [name] 12/17/2021 9:00 [place] Record Number: [id number] Patient Account Number: [id number] Date of Birth/Sex: Treating RN: 20-Nov-1951 (71 y.o. [name] Primary Care Provider: PA [name], [place] Other Clinician: Referring Provider: Treating Provider/Extender: Allena Katz in Treatment: 0 Verbal / Phone Orders: No Diagnosis Coding Follow-up Appointments ppointment in 2 weeks. - Dr. Dellia Nims Return A Bathing/ Shower/ Hygiene May shower and wash wound with soap and water. - every other day Edema Control - Lymphedema / SCD / Other Bilateral Lower Extremities Exercise regularly Moisturize legs daily. - cetaphi/triamcinolone cream to legs and feet nightly before bed Compression stocking or Garment 20-30 mm/Hg pressure to: - juxtalite compression garments both legs daily Home Health Admit to [facility] for wound care. May utilize formulary equivalent dressing for wound treatment orders unless otherwise specified. Dressing changes to be completed by [place] on Tuesday / Thursday / Saturday except when patient has scheduled visit at [facility]. Wound Treatment Wound #8 - T -  Web between 2nd and 3rd oe Wound Laterality: Left Peri-Wound Care: prescription cetaphil/triamcinolone cream Every Other Day/30 Days Discharge Instructions: apply to feet and legs nightly Prim Dressing: KerraCel Ag Gelling Fiber Dressing, 2x2 in (silver alginate) Every Other Day/30 Days ary Discharge Instructions: Apply silver alginate to wound bed as instructed Secondary Dressing: Woven Gauze Sponge, Non-Sterile 4x4 in Every Other Day/30 Days Discharge Instructions: Apply over primary dressing as directed. Secured With: Child psychotherapist, Sterile 2x75 (in/in) Every Other Day/30 Days Discharge Instructions: Secure with stretch gauze as directed. Patient Medications llergies: No Known Allergies A Notifications Medication Indication Start End CUSTOM MED: triamcinolone acetonide-cetaphil DOSE NaN - topical .1% cream - 1:4 cream topical to lower leg with dressing changes Electronic Signature(s) Signed: 12/17/2021 5:42:44 PM By: Baruch Gouty RN, BSN Signed: 12/22/2021 4:28:08 PM By: Linton Ham MD Previous Signature: 12/17/2021 4:38:12 PM Version By: Linton Ham MD Entered By: Baruch Gouty on 12/17/2021 16:44:03 Prescription 12/17/2021 -------------------------------------------------------------------------------- [place], [place] Linton Ham MD Patient Name: Provider: [birth year] [id number] Date of Birth: NPI#: Jerilynn Mages [id number] Sex: DEA #: [phone] [id number] Phone #: License #: [place] Patient Address: [place] [address] [place], [place] [address] [place], [place] [address] [phone] Allergies No Known Allergies Medication Medication: Route: Strength: Form: triamcinolone acetonide-cetaphil topical .1% cream Class: Dose: Frequency / Time: Indication: NaN 1:4 cream topical to lower leg with dressing changes Number of Refills: Number of Units: 0 Generic Substitution: Start  Date: End Date:  Administered at Facility: Substitution Permitted No Note to Pharmacy: Hand Signature: Date(s): Electronic Signature(s) Signed: 12/17/2021 5:42:44 PM By: Baruch Gouty RN, BSN Signed: 12/22/2021 4:28:08 PM By: Linton Ham MD Entered By: Baruch Gouty on 12/17/2021 16:44:04 -------------------------------------------------------------------------------- Problem List Details Patient Name: Date of Service: [name], [name] 12/17/2021 9:00 A M Medical Record Number: [id number] Patient Account Number: [id number] Date of Birth/Sex: Treating RN: 1951-02-14 (71 y.o. [name] Primary Care Provider: PA [name], NO Other Clinician: Referring Provider: Treating Provider/Extender: Allena Katz in Treatment: 0 Active Problems ICD-10 Encounter Code Description Active Date MDM Diagnosis E11.621 Type 2 diabetes mellitus with foot ulcer 12/17/2021 No Yes L97.528 Non-pressure chronic ulcer of other part of left foot with other specified 12/17/2021 No Yes severity I87.323 Chronic venous hypertension (idiopathic) with inflammation of bilateral lower 12/17/2021 No Yes extremity Inactive Problems Resolved Problems Electronic Signature(s) Signed: 12/17/2021 4:38:12 PM By: Linton Ham MD Entered By: Linton Ham on 12/17/2021 10:41:57 -------------------------------------------------------------------------------- [place] Note Details Patient Name: Date of Service: [name], [name] 12/17/2021 9:00 A M Medical Record Number: [id number] Patient Account Number: [id number] Date of Birth/Sex: Treating RN: 04/06/1951 (71 y.o. [name] Primary Care Provider: PA [name], NO Other Clinician: Referring Provider: Treating Provider/Extender: Allena Katz in Treatment: 0 Subjective Chief Complaint Information obtained from Patient bilateral lower extremity swelling with wounds 12/17/2021; patient returns to clinic out of concern  for problems with his lower legs and feet. History of Present Illness (HPI) Durrell Barajas is a 71 year old male with a past medical history of type 2 diabetes and essential hypertension who presents to the clinic today for bilateral lower extremity wounds. He was recently seen in the ED on 3/9 for bilateral lower extremity edema. He had a DVT study that was negative for clots. He was given triamcinolone cream and advised to elevate legs and obtain compression stockings. He has been using the triamcinolone cream however he is unable to use compression stockings due to difficulty of putting on. Patient states that for the past 2 years he has had opening and closing of weeping wounds to his legs bilaterally. They spontaneously open and close. They are often open for several weeks before they heal spontaneously. He denies any purulent drainage, increased warmth or increased erythema to the skin. 4/21; patient presents for 1 week follow-up. The wounds on the right leg have healed however he has now 2 new wounds 1 on the anterior and posterior thigh that occurred from him scratching underneath the wrap. The left leg wound has improved and almost closed 4/28; patient presents for 1 week follow-up. He continues to have a wound on the right anterior leg that is showing signs of healing. The posterior right thigh has closed. He now has a new wound to the left leg that is limited to skin breakdown. 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 The wounds are occurring because he goes underneath the leg wraps and scratches creating new wounds. He has no complaints today. 5/5; patient presents for 1 week follow-up. He no longer has any open wounds to his lower extremities bilaterally. He continues to have scattered excoriation marks from where he scratches. These are scabbed over. He states he is going to see dermatology this month. Overall he is  doing well and is happy with his care. Readmission 6/10 Patient was followed for open wounds limited to skin breakdown  to his lower extremities bilaterally 2/2 venous insufficiency. Once the wounds healed he was discharged with juxta lite compressions and these were placed in office. He was discharged on 5/5. He states that he has not taken the wraps off since discharge and has had them on for over a month. He did not want to take them off because they were very comfortable. He does not have any difficulty putting these on and off if he wanted to. He states that he does not take a shower and just cleans the other parts of his body at the sink. He noticed that 2 days ago he was having some drainage at the top of the wrap and called in to be evaluated. He states that he has not seen the wounds until the wraps were taken off today in office. He denies pain or increased warmth or erythema to the area 6/27; patient presents for 2-week follow-up. He has had increased swelling and drainage to the wounds over the last week. He reports increased erythema to the area. He does not have pain however. He denies purulent drainage or fever/chills. 7/5; patient presents for 1 week follow-up. He was prescribed Keflex at last clinic visit and is currently taking this. He reports significant improvement to his leg swelling, tenderness and erythema. He denies signs of infection. 7/21; patient presents for 2-week follow-up. He reports improvement in healing in his right lower extremity. Unfortunately this morning he noticed skin breakdown to his left lower extremity with increased redness and warmth. 7/28; patient presents for 1 week follow-up. He reports completing his antibiotics. He states that his wounds are healed. He has been using his juxta light compressions. He has no issues or complaints today. Readmission 12/17/2021; This is a 71 year old man who has been seen twice by Dr. Heber [place] for wounds on his bilateral  lower extremities secondary to chronic venous insufficiency. Most recently this was from 05/22/2021 through 07/09/2021 with bilateral lower extremity wounds largely secondary to chronic venous insufficiency with stasis dermatitis. He has been discharged with juxta lite stockings. The patient tells me that about a month ago he developed too much edema to get his juxta lites on. He developed increased swelling. He saw his primary physician who gave him Lasix and the swelling seems to have come down. 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 He says this dates back into his adolescence and he could not walk barefoot etc. As a result of this he sometimes leaves his shoes and socks on for weeks and he is done that recently.[name] He arrives in clinic with not any wounds on his legs however with close inspection he had a fairly deep wound in the left second and third toe webspace His skin on his feet was not in good shape macerated flaking malodorous The patient's past medical history has not changed. I note that he had arterial studies done on 06/03/2021 that showed an ABI on the right of 1.26 with triphasic waveforms TBI of 0.63 on the right on the left ABI of 0.85 TBI of 0.63 again with triphasic waveforms Patient History Unable to Obtain Patient History due to Altered Mental Status. Information obtained from Patient. Allergies No Known Allergies Family History Cancer - Mother,Maternal Grandparents, Heart Disease - Maternal Grandparents, Hypertension - Maternal Grandparents, No family history of Diabetes, Hereditary Spherocytosis. Social History Former smoker - ended on 09/14/1983, Marital Status - Single, Alcohol Use - Rarely, Drug Use - No History, Caffeine  Use - Never. Medical History Eyes Patient has history of Cataracts - 2014 Hematologic/Lymphatic Patient has history of Lymphedema Cardiovascular Patient has history of Arrhythmia - A-Fib,  Peripheral Venous Disease Endocrine Patient has history of Type II Diabetes Musculoskeletal Patient has history of Osteoarthritis Neurologic Patient has history of Neuropathy Denies history of Quadriplegia, Paraplegia, Seizure Disorder Oncologic Denies history of Received Chemotherapy, Received Radiation Psychiatric Denies history of Anorexia/bulimia, Confinement Anxiety Hospitalization/Surgery History - colon resection. - appendectomy. - multiple tooth extraction. Medical A Surgical History Notes nd Hematologic/Lymphatic Hyperbilirubinemia Gastrointestinal Rectal Pain-Constipation Genitourinary CKD stage 3-BPH Oncologic Colon Cancer-Colon Re-section Review of Systems (ROS) Constitutional Symptoms (General Health) Denies complaints or symptoms of Fatigue, Fever, Chills, Marked Weight Change. Eyes Denies complaints or symptoms of Dry Eyes, Vision Changes, Glasses / Contacts. Ear/Nose/Mouth/Throat Denies complaints or symptoms of Chronic sinus problems or rhinitis. Respiratory Denies complaints or symptoms of Chronic or frequent coughs, Shortness of Breath. Cardiovascular Denies complaints or symptoms of Chest pain. Gastrointestinal Denies complaints or symptoms of Frequent diarrhea, Nausea, Vomiting. Endocrine Denies complaints or symptoms of Heat/cold intolerance. Genitourinary Denies complaints or symptoms of Frequent urination. Integumentary (Skin) Complains or has symptoms of Wounds - left 4th toe. Musculoskeletal Denies complaints or symptoms of Muscle Pain, Muscle Weakness. Neurologic Denies complaints or symptoms of Numbness/parasthesias. Psychiatric Denies complaints or symptoms of Claustrophobia, Suicidal. Objective Constitutional Wide pulse pressure but he is otherwise asymptomatic. Pulse regular and within target range for patient.[name] Respirations regular, non-labored and within target range.. Temperature is normal and within the target range for the  patient.[name] Appears in no distress. Vitals Time Taken: 9:51 AM, Height: 75 in, Source: Stated, Weight: 245 lbs, Source: Stated, BMI: 30.6, Temperature: 98.7 F, Pulse: 47 bpm, Respiratory Rate: 18 breaths/min, Blood Pressure: 109/40 mmHg, Capillary Blood Glucose: 111 mg/dl. General Notes: glucose per pt report this am Respiratory work of breathing is normal. Bilateral breath sounds are clear and equal in all lobes with no wheezes, rales or rhonchi.. Cardiovascular Heart rhythm and rate regular, without murmur or gallop.No evidence of CHF. Pedal pulses are palpable bilaterally including dorsalis pedis and posterior tibial. Popliteal pulses are also palpable. Edema present in both extremities.However this is nonpitting. He has severe stasis dermatitis with erythema on his bilateral lower legs but no focal tenderness to suggest active bacterial infection. General Notes: Wound exam; surprisingly did not have open areas on his legs and he has not worn his juxta lite stockings for a month. As noted that is been the reason for his continued presence in our clinic over the last 2 years with careful inspection of his feet I was able to identify a wound between the left second and third toes this has some relative size and depth. I did not think he had tinea pedis after careful inspection he does not have any symptoms no pain and no pruritus Integumentary (Hair, Skin) Wound #8 status is Open. Original cause of wound was Gradually Appeared. The date acquired was: 12/14/2021. The wound is located on the Left T - Web oe between 2nd and 3rd. The wound measures 2cm length x 0.8cm width x 0.1cm depth; 1.257cm^2 area and 0.126cm^3 volume. There is Fat Layer (Subcutaneous Tissue) exposed. There is no tunneling or undermining noted. There is a medium amount of serous drainage noted. The wound margin is flat and intact. There is medium (34-66%) pink granulation within the wound bed. There is a medium (34-66%) amount of  necrotic tissue within the wound bed including Adherent Slough. Assessment Active Problems ICD-10  Type 2 diabetes mellitus with foot ulcer Non-pressure chronic ulcer of other part of left foot with other specified severity Chronic venous hypertension (idiopathic) with inflammation of bilateral lower extremity Plan Follow-up Appointments: Return Appointment in 2 weeks. - Dr. Dellia Nims Bathing/ Shower/ Hygiene: May shower and wash wound with soap and water. - every other day Edema Control - Lymphedema / SCD / Other: Exercise regularly Moisturize legs daily. - cetaphi/triamcinolone cream to legs and feet nightly before bed Compression stocking or Garment 20-30 mm/Hg pressure to: - juxtalite compression garments both legs daily Home Health: Admit to [facility] for wound care. May utilize formulary equivalent dressing for wound treatment orders unless otherwise specified. Dressing changes to be completed by [place] on Tuesday / Thursday / Saturday except when patient has scheduled visit at [facility]. WOUND #8: - T - Web between 2nd and 3rd Wound Laterality: Left oe Peri-Wound Care: prescription cetaphil/triamcinolone cream Every Other Day/30 Days Discharge Instructions: apply to feet and legs nightly Prim Dressing: KerraCel Ag Gelling Fiber Dressing, 2x2 in (silver alginate) Every Other Day/30 Days ary Discharge Instructions: Apply silver alginate to wound bed as instructed Secondary Dressing: Woven Gauze Sponge, Non-Sterile 4x4 in Every Other Day/30 Days Discharge Instructions: Apply over primary dressing as directed. Secured With: Child psychotherapist, Sterile 2x75 (in/in) Every Other Day/30 Days Discharge Instructions: Secure with stretch gauze as directed. 1. We applied silver alginate between the left second and third toe, gauze 2. He lives in reasonably independent assisted living he is going to need home health and we will try to arrange that 3. I prescribed  triamcinolone 0.1% 1-4 in Cetaphil for his bilateral lower legs. This should help with the degree of erythema and dry flaking skin. We are going to restart the juxta lite stockings and the patient expresses understanding 4. I stressed to him the importance of foot care including cleaning, inspection and moisturizers. This would be in spite of the dysesthetic type pain he has had for many years. He did not have clear evidence of diabetic neuropathy to the monofilament at least 5. We will continue to follow him for the left second and third toe wound Electronic Signature(s) Signed: 12/17/2021 4:38:12 PM By: Linton Ham MD Entered By: Linton Ham on 12/17/2021 12:20:17 -------------------------------------------------------------------------------- HxROS Details Patient Name: Date of Service: [name], [name] 12/17/2021 9:00 A M Medical Record Number: [id number] Patient Account Number: [id number] Date of Birth/Sex: Treating RN: 20-Nov-1951 (71 y.o. [name] Primary Care Provider: PA [name], [place] Other Clinician: Referring Provider: Treating Provider/Extender: Allena Katz in Treatment: 0 Unable to Obtain Patient History due to Altered Mental Status Information Obtained From Patient Constitutional Symptoms (Rachel) Complaints and Symptoms: Negative for: Fatigue; Fever; Chills; Marked Weight Change Eyes Complaints and Symptoms: Negative for: Dry Eyes; Vision Changes; Glasses / Contacts Medical History: Positive for: Cataracts - 2014 Ear/Nose/Mouth/Throat Complaints and Symptoms: Negative for: Chronic sinus problems or rhinitis Respiratory Complaints and Symptoms: Negative for: Chronic or frequent coughs; Shortness of Breath Cardiovascular Complaints and Symptoms: Negative for: Chest pain Medical History: Positive for: Arrhythmia - A-Fib; Peripheral Venous Disease Gastrointestinal Complaints and Symptoms: Negative for: Frequent  diarrhea; Nausea; Vomiting Medical History: Past Medical History Notes: Rectal Pain-Constipation Endocrine Complaints and Symptoms: Negative for: Heat/cold intolerance Medical History: Positive for: Type II Diabetes Time with diabetes: Since 2014 Treated with: Oral agents Blood sugar tested every day: Yes Tested : Genitourinary Complaints and Symptoms: Negative for: Frequent urination Medical History: Past  Medical History Notes: CKD stage 3-BPH Integumentary (Skin) Complaints and Symptoms: Positive for: Wounds - left 4th toe Musculoskeletal Complaints and Symptoms: Negative for: Muscle Pain; Muscle Weakness Medical History: Positive for: Osteoarthritis Neurologic Complaints and Symptoms: Negative for: Numbness/parasthesias Medical History: Positive for: Neuropathy Negative for: Quadriplegia; Paraplegia; Seizure Disorder Psychiatric Complaints and Symptoms: Negative for: Claustrophobia; Suicidal Medical History: Negative for: Anorexia/bulimia; Confinement Anxiety Hematologic/Lymphatic Medical History: Positive for: Lymphedema Past Medical History Notes: Hyperbilirubinemia Immunological Oncologic Medical History: Negative for: Received Chemotherapy; Received Radiation Past Medical History Notes: Colon Cancer-Colon Re-section HBO Extended History Items Eyes: Cataracts Immunizations Pneumococcal Vaccine: Received Pneumococcal Vaccination: No Implantable Devices None Hospitalization / Surgery History Type of Hospitalization/Surgery colon resection appendectomy multiple tooth extraction Family and Social History Cancer: Yes - Mother,Maternal Grandparents; Diabetes: No; Heart Disease: Yes - Maternal Grandparents; Hereditary Spherocytosis: No; Hypertension: Yes - Maternal Grandparents; Former smoker - ended on 09/14/1983; Marital Status - Single; Alcohol Use: Rarely; Drug Use: No History; Caffeine Use: Never; Financial Concerns: No; Food, Clothing or Shelter  Needs: No; Support System Lacking: No; Transportation Concerns: No Electronic Signature(s) Signed: 12/17/2021 4:38:12 PM By: Linton Ham MD Signed: 12/17/2021 5:42:44 PM By: Baruch Gouty RN, BSN Entered By: Baruch Gouty on 12/17/2021 09:59:24 -------------------------------------------------------------------------------- [place] Details Patient Name: Date of Service: [name], [name] 12/17/2021 Medical Record Number: [id number] Patient Account Number: [id number] Date of Birth/Sex: Treating RN: [birth year] (71 y.o. [name] Primary Care Provider: PA TIENT, NO Other Clinician: Referring Provider: Treating Provider/Extender: Marylin Crosby Weeks in Treatment: 0 Diagnosis Coding ICD-10 Codes Code Description E11.621 Type 2 diabetes mellitus with foot ulcer L97.528 Non-pressure chronic ulcer of other part of left foot with other specified severity I87.323 Chronic venous hypertension (idiopathic) with inflammation of bilateral lower extremity Facility Procedures CPT4 Code: 52778242 Description: 99214 - WOUND CARE VISIT-LEV 4 EST PT Modifier: Quantity: 1 Physician Procedures : CPT4 Code Description Modifier [id number] - WC PHYS LEVEL 4 - EST PT ICD-10 Diagnosis Description E11.621 Type 2 diabetes mellitus with foot ulcer L97.528 Non-pressure chronic ulcer of other part of left foot with other specified severity I87.323  Chronic venous hypertension (idiopathic) with inflammation of bilateral lower extremity Quantity: 1 Electronic Signature(s) Signed: 12/17/2021 5:42:44 PM By: Baruch Gouty RN, BSN Signed: 12/22/2021 4:28:08 PM By: Linton Ham MD Previous Signature: 12/17/2021 4:38:12 PM Version By: Linton Ham MD Entered By: Baruch Gouty on 12/17/2021 16:48:58

## 2021-12-22 NOTE — ED Triage Notes (Signed)
Per [facility] pt coming from [place] facility. States he was watching tv through the night and dozed off around 2:30am for about 30 mins then woke up with some nausea. Started feeling worse around 5am and felt short of breath. Took 2 melatonin gummies and tried to go back to sleep but started feeling worse. HR found to be 30-46 and dropped into 20s. Patient A&0x4.

## 2021-12-22 NOTE — H&P (Signed)
History and Physical    [name] BTD:[id number] DOB: Mar 05, 1951 DOA: 12/22/2021  PCP: Pcp, No  Patient coming from: Home  Chief Complaint: "I was crashing"  HPI: [name] is a 71 y.o. male with medical history significant of DM2, HTN, HLD, BPH, anxiety, afib. Presenting with epigastric abdominal pain and dizziness. He reports that he started feeling nauseous this morning. He was feeling some "pressure against" his stomach and it seemed to be in the epigastric and RUQ area. His nausea worsened but he didn't vomit. He began feeling lightheaded, dizzy and weak. He called for EMS. When they found him, he was bradycardic in the 30s. They brought him to the ED for evaluation. He denies any other aggravating or alleviating factors.   ED Course: He was bradycardic. BP was ok. Lipase was negative. Abdominal imaging showed cholelithiasis w/o cholecystitis. Cardiology was consulted for bradycardia. [facility] was called for admission.   Review of Systems:  Review of systems is otherwise negative for all not mentioned in HPI.   PMHx Past Medical History:  Diagnosis Date   Atrial fibrillation ([place])    Cataract    Diabetes mellitus without complication ([place])    Hyperlipidemia    Hypertension    Lichen planus    bilateral legs    PSHx Past Surgical History:  Procedure Laterality Date   APPENDECTOMY     COLON RESECTION N/A 11/22/2016   Procedure: HAND ASSISTED LAPAROSCOPIC COLON RESECTION;  Surgeon: [name], MD;  Location: [facility];  Service: General;  Laterality: N/A;   COLONOSCOPY N/A 11/17/2016   Procedure: COLONOSCOPY;  Surgeon: [name], MD;  Location: [facility];  Service: Endoscopy;  Laterality: N/A;   FRACTURE SURGERY     MULTIPLE TOOTH EXTRACTIONS     RECTAL EXAM UNDER ANESTHESIA N/A 12/05/2016   Procedure: RECTAL EXAM UNDER ANESTHESIA, DISIMPACTION;  Surgeon: Alphonsa Overall, MD;  Location: [facility];  Service: General;  Laterality: N/A;    SocHx  reports that he  quit smoking about 38 years ago. His smoking use included pipe. He has never used smokeless tobacco. He reports that he does not drink alcohol and does not use drugs.  Allergies  Allergen Reactions   Other     [name] bandage (self-gripping) - erythema, itching    FamHx Family History  Problem Relation Age of Onset   Brain cancer Mother    Alzheimer's disease Father    Cancer Maternal Grandmother    Heart attack Maternal Grandfather    Pneumonia Paternal Grandfather     Prior to Admission medications   Medication Sig Start Date End Date Taking? Authorizing Provider  atorvastatin (LIPITOR) 20 MG tablet Take 20 mg by mouth daily. 02/02/21  Yes [provider]  dextrose (GLUTOSE) 40 % GEL Take 1 Tube by mouth as needed for low blood sugar.   Yes [provider]  digoxin (LANOXIN) 0.25 MG tablet Take 250 mcg by mouth daily. 02/02/21  Yes [provider]  docusate sodium (COLACE) 100 MG capsule Take 1 capsule (100 mg total) by mouth 2 (two) times daily. 12/08/16  Yes Barton Dubois, MD  folic acid (FOLVITE) 1 MG tablet Take 1 mg by mouth See admin instructions. 1mg  everyday EXCEPT none on Fridays. 01/29/21  Yes [provider]  glipiZIDE (GLUCOTROL) 10 MG tablet Take 10 mg by mouth 2 (two) times daily before a meal. 01/23/21  Yes [provider]  hydrOXYzine (ATARAX/VISTARIL) 25 MG tablet Take 1 tablet (25 mg  total) by mouth every 8 (eight) hours as needed for itching. Use caution as this medication may make you drowsy 07/17/20  Yes Little, Wenda Overland, MD  JANUVIA 100 MG tablet Take 100 mg by mouth daily. 01/20/21  Yes [provider]  metFORMIN (GLUCOPHAGE) 1000 MG tablet Take 1,000 mg by mouth 2 (two) times daily with a meal. 01/23/21  Yes [provider]  methotrexate (RHEUMATREX) 2.5 MG tablet Take 10 mg by mouth once a week. Takes on Fridays. 01/29/21  Yes [provider]  metoprolol tartrate (LOPRESSOR) 25 MG  tablet Take 12.5 mg by mouth 2 (two) times daily. 02/16/21  Yes [provider]  miconazole (MICOTIN) 2 % powder Apply 1 application topically See admin instructions. 1 application topical twice daily And 1 application daily to groin, stomach, thighs, buttocks area for chafing.   Yes [provider]  polyethylene glycol (MIRALAX) packet Take 17 g by mouth daily. Hold for diarrhea 12/08/16  Yes Barton Dubois, MD  PRESCRIPTION MEDICATION Apply 1 application topically daily. Tac 0.1% Cetaphil 1:4 Apply to lower legs   Yes [provider]  tamsulosin (FLOMAX) 0.4 MG CAPS capsule Take 1 capsule (0.4 mg total) by mouth daily. 11/27/16  Yes Barton Dubois, MD  triamcinolone (KENALOG) 0.1 % Apply 1 application topically See admin instructions. 1 application topical twice daily to itchy areas And 1 extra application daily as needed for dermatitis   Yes [provider]  busPIRone (BUSPAR) 7.5 MG tablet Take 1 tablet (7.5 mg total) by mouth 3 (three) times daily. Patient not taking: Reported on 02/18/2021 11/26/16   Barton Dubois, MD  digoxin (LANOXIN) 0.125 MG tablet Take 1 tablet (0.125 mg total) by mouth daily. Patient not taking: Reported on 02/18/2021 11/27/16   Barton Dubois, MD  Metoprolol Tartrate 75 MG TABS Take 75 mg by mouth 2 (two) times daily. Patient not taking: Reported on 02/18/2021 11/26/16   Barton Dubois, MD    Physical Exam: Vitals:   12/22/21 1030 12/22/21 1103 12/22/21 1240 12/22/21 1332  BP: (!) 128/50 114/73 (!) 131/59 135/65  Pulse: (!) 46 (!) 46 (!) 44 (!) 49  Resp: (!) 22 20 19 14   Temp:      TempSrc:      SpO2: 95% 99% 100% 100%  Weight:      Height:        General: 71 y.o. male resting in bed in NAD Eyes: PERRL, normal sclera ENMT: Nares patent w/o discharge, orophaynx clear, dentition normal, ears w/o discharge/lesions/ulcers Neck: Supple, trachea midline Cardiovascular: brady, +S1, S2, no m/g/r, equal pulses  throughout Respiratory: CTABL, no w/r/r, normal WOB GI: BS+, NDNT, no masses noted, no organomegaly noted MSK: No e/c/c Neuro: A&O x 3, no focal deficits Psyc: Appropriate interaction and affect, calm/cooperative  Labs on Admission: I have personally reviewed following labs and imaging studies  CBC: Recent Labs  Lab 12/22/21 0918  WBC 6.3  NEUTROABS 5.6  HGB 10.0*  HCT 30.0*  MCV 106.8*  PLT 825   Basic Metabolic Panel: Recent Labs  Lab 12/22/21 0918  NA 134*  K 4.4  CL 103  CO2 23  GLUCOSE 161*  BUN 22  CREATININE 1.09  CALCIUM 8.5*   GFR: Estimated Creatinine Clearance: 87.3 mL/min (by C-G formula based on SCr of 1.09 mg/dL). Liver Function Tests: Recent Labs  Lab 12/22/21 0918  AST 16  ALT 14  ALKPHOS 80  BILITOT 0.8  PROT 7.3  ALBUMIN 3.4*   Recent Labs  Lab 12/22/21 0918  LIPASE 40   No results for input(s): AMMONIA in the last 168 hours. Coagulation Profile: No results for input(s): INR, PROTIME in the last 168 hours. Cardiac Enzymes: No results for input(s): CKTOTAL, CKMB, CKMBINDEX, TROPONINI in the last 168 hours. BNP (last 3 results) No results for input(s): PROBNP in the last 8760 hours. HbA1C: No results for input(s): HGBA1C in the last 72 hours. CBG: No results for input(s): GLUCAP in the last 168 hours. Lipid Profile: No results for input(s): CHOL, HDL, LDLCALC, TRIG, CHOLHDL, LDLDIRECT in the last 72 hours. Thyroid Function Tests: No results for input(s): TSH, T4TOTAL, FREET4, T3FREE, THYROIDAB in the last 72 hours. Anemia Panel: No results for input(s): VITAMINB12, FOLATE, FERRITIN, TIBC, IRON, RETICCTPCT in the last 72 hours. Urine analysis:    Component Value Date/Time   COLORURINE STRAW (A) 01/26/2017 1920   APPEARANCEUR CLEAR 01/26/2017 1920   LABSPEC 1.011 01/26/2017 1920   PHURINE 7.0 01/26/2017 1920   GLUCOSEU 50 (A) 01/26/2017 1920   HGBUR NEGATIVE 01/26/2017 1920   BILIRUBINUR NEGATIVE 01/26/2017 1920   BILIRUBINUR  NEG 05/27/2013 1516   KETONESUR 5 (A) 01/26/2017 1920   PROTEINUR NEGATIVE 01/26/2017 1920   UROBILINOGEN 0.2 05/27/2013 1516   NITRITE NEGATIVE 01/26/2017 1920   LEUKOCYTESUR NEGATIVE 01/26/2017 1920    Radiological Exams on Admission: CT Abdomen Pelvis W Contrast  Result Date: 12/22/2021 CLINICAL DATA:  Epigastric abdominal pain. EXAM: CT ABDOMEN AND PELVIS WITH CONTRAST TECHNIQUE: Multidetector CT imaging of the abdomen and pelvis was performed using the standard protocol following bolus administration of intravenous contrast. CONTRAST:  17mL OMNIPAQUE IOHEXOL 300 MG/ML  SOLN COMPARISON:  January 26, 2017. FINDINGS: Lower chest: No acute abnormality. Hepatobiliary: Cholelithiasis is noted without biliary dilatation. Liver is unremarkable. Pancreas: Unremarkable. No pancreatic ductal dilatation or surrounding inflammatory changes. Spleen: Normal in size without focal abnormality. Adrenals/Urinary Tract: Adrenal glands appear normal. Probable left parapelvic cysts are noted. No hydronephrosis or renal obstruction is noted. Urinary bladder is unremarkable. Stomach/Bowel: Stomach is unremarkable. There is no evidence of bowel obstruction or inflammation. Diverticulosis of descending and sigmoid colon is noted without inflammation. Vascular/Lymphatic: Aortic atherosclerosis. No enlarged abdominal or pelvic lymph nodes. Reproductive: Prostate is unremarkable. Other: No abdominal wall hernia or abnormality. No abdominopelvic ascites. Musculoskeletal: No acute or significant osseous findings. IMPRESSION: Cholelithiasis without definite evidence of cholecystitis. Diverticulosis of descending and sigmoid colon without inflammation. No definite acute abnormality seen in the abdomen or pelvis. Aortic Atherosclerosis (ICD10-I70.0). Electronically Signed   By: Marijo Conception M.D.   On: 12/22/2021 11:33   US Abdomen Limited  Result Date: 12/22/2021 CLINICAL DATA:  Epigastric pain, gallstones EXAM: ULTRASOUND  ABDOMEN LIMITED RIGHT UPPER QUADRANT COMPARISON:  CT abdomen 12/22/2021, 01/26/2017 FINDINGS: Gallbladder: Cholelithiasis. No gallbladder wall thickening or pericholecystic fluid. Small amount of gallbladder sludge. Negative sonographic Murphy sign. Common bile duct: Diameter: 6.6 mm Liver: No focal lesion identified. Increased hepatic parenchymal echogenicity. Portal vein is patent on color Doppler imaging with normal direction of blood flow towards the liver. Other: None. IMPRESSION: 1. Cholelithiasis without sonographic evidence of acute cholecystitis. 2. Increased hepatic echogenicity as can be seen with hepatic steatosis. Electronically Signed   By: Kathreen Devoid M.D.   On: 12/22/2021 12:29    EKG: Independently reviewed. Slow a fib  Assessment/Plan Symptomatic bradycardia Afib     - place in obs, tele     - cardiology onboard, appreciate assistance     - hold dig, metoprolol     -  follow echo  Abdominal pain     - w/u thus far is negative     - his symptoms seem to have improved     - if they return, can consider HIDA scan     - right now, will have protoxin ordered and follow  DM2     - A1c, SSI, glucose check, DM diet  HTN     - holding metoprolol for now; will have PRN hydralazine available  HLD     - continue statin  BPH     - continue flomax  Macrocytic anemia     - at baseline, no evidence of bleed, follow  Eczematous dermatitis     - continue home regimen (MTX on Fridays)  DVT prophylaxis: lovenox  Code Status: FULL  Family Communication: None at bedside  Consults called: Cardiology   Status is: Observation  The patient remains OBS appropriate and will d/c before 2 midnights.  Jonnie Finner DO [facility]  If 7PM-7AM, please contact night-coverage [email or web address]  12/22/2021, 2:52 PM

## 2021-12-22 NOTE — ED Provider Notes (Signed)
[facility] EMERGENCY DEPARTMENT Provider Note    CSN: [id number] Arrival date & time: 12/22/21 4268  History Chief Complaint  Patient presents with   Nausea   Abdominal Pain    [name] is a 71 y.o. male with history of atrial fibrillation reports he was having trouble sleeping last night which is not unusual for him but early this morning he began feeling nauseated without vomiting and then developed moderate aching epigastric pain, general weakness and lightheadedness. No CP. Mild SOB. EMS was called and he was found to be bradycardic in the 30-40s. BP was not low. Nausea has improved but he still has the pain. He is unsure what medications he is taking and his LTCF did not send a med list. He has previously been on both betablocker and digoxin.    Home Medications Prior to Admission medications   Medication Sig Start Date End Date Taking? Authorizing Provider  atorvastatin (LIPITOR) 20 MG tablet Take 20 mg by mouth daily. 02/02/21  Yes [provider]  dextrose (GLUTOSE) 40 % GEL Take 1 Tube by mouth as needed for low blood sugar.   Yes [provider]  digoxin (LANOXIN) 0.25 MG tablet Take 250 mcg by mouth daily. 02/02/21  Yes [provider]  docusate sodium (COLACE) 100 MG capsule Take 1 capsule (100 mg total) by mouth 2 (two) times daily. 12/08/16  Yes Barton Dubois, MD  folic acid (FOLVITE) 1 MG tablet Take 1 mg by mouth See admin instructions. 1mg  everyday EXCEPT none on Fridays. 01/29/21  Yes [provider]  glipiZIDE (GLUCOTROL) 10 MG tablet Take 10 mg by mouth 2 (two) times daily before a meal. 01/23/21  Yes [provider]  hydrOXYzine (ATARAX/VISTARIL) 25 MG tablet Take 1 tablet (25 mg total) by mouth every 8 (eight) hours as needed for itching. Use caution as this medication may make you drowsy 07/17/20  Yes Little, Wenda Overland, MD  JANUVIA 100 MG tablet Take 100 mg by mouth daily. 01/20/21  Yes [provider]   metFORMIN (GLUCOPHAGE) 1000 MG tablet Take 1,000 mg by mouth 2 (two) times daily with a meal. 01/23/21  Yes [provider]  methotrexate (RHEUMATREX) 2.5 MG tablet Take 10 mg by mouth once a week. Takes on Fridays. 01/29/21  Yes [provider]  metoprolol tartrate (LOPRESSOR) 25 MG tablet Take 12.5 mg by mouth 2 (two) times daily. 02/16/21  Yes [provider]  miconazole (MICOTIN) 2 % powder Apply 1 application topically See admin instructions. 1 application topical twice daily And 1 application daily to groin, stomach, thighs, buttocks area for chafing.   Yes [provider]  polyethylene glycol (MIRALAX) packet Take 17 g by mouth daily. Hold for diarrhea 12/08/16  Yes Barton Dubois, MD  PRESCRIPTION MEDICATION Apply 1 application topically daily. Tac 0.1% Cetaphil 1:4 Apply to lower legs   Yes [provider]  tamsulosin (FLOMAX) 0.4 MG CAPS capsule Take 1 capsule (0.4 mg total) by mouth daily. 11/27/16  Yes Barton Dubois, MD  triamcinolone (KENALOG) 0.1 % Apply 1 application topically See admin instructions. 1 application topical twice daily to itchy areas And 1 extra application daily as needed for dermatitis   Yes [provider]  busPIRone (BUSPAR) 7.5 MG tablet Take 1 tablet (7.5 mg total) by mouth 3 (three) times daily. Patient not taking: Reported on 02/18/2021 11/26/16   Barton Dubois, MD  digoxin (LANOXIN) 0.125 MG tablet Take 1 tablet (0.125 mg total) by mouth daily.  Patient not taking: Reported on 02/18/2021 11/27/16   Barton Dubois, MD  Metoprolol Tartrate 75 MG TABS Take 75 mg by mouth 2 (two) times daily. Patient not taking: Reported on 02/18/2021 11/26/16   Barton Dubois, MD     Allergies    Other   Review of Systems   Review of Systems Please see HPI for pertinent positives and negatives  Physical Exam BP 135/65 (BP Location: Right Arm)    Pulse (!) 49    Temp 97.7 F (36.5 C) (Oral)    Resp 14    Ht 6\' 3"  (1.905 m)     Wt 117.9 kg    SpO2 100%    BMI 32.50 kg/m   Physical Exam Vitals and nursing note reviewed.  Constitutional:      Appearance: Normal appearance.  HENT:     Head: Normocephalic and atraumatic.     Nose: Nose normal.     Mouth/Throat:     Mouth: Mucous membranes are moist.  Eyes:     Extraocular Movements: Extraocular movements intact.     Conjunctiva/sclera: Conjunctivae normal.  Cardiovascular:     Rate and Rhythm: Bradycardia present. Rhythm irregular.  Pulmonary:     Effort: Pulmonary effort is normal.     Breath sounds: Normal breath sounds.  Abdominal:     General: Abdomen is flat.     Palpations: Abdomen is soft.     Tenderness: There is abdominal tenderness in the epigastric area. There is guarding. Negative signs include Murphy's sign and McBurney's sign.  Musculoskeletal:        General: No swelling. Normal range of motion.     Cervical back: Neck supple.     Comments: BLE in wraps and diabetic shoes  Skin:    General: Skin is warm and dry.  Neurological:     General: No focal deficit present.     Mental Status: He is alert.  Psychiatric:        Mood and Affect: Mood normal.    ED Results / Procedures / Treatments   EKG EKG Interpretation  Date/Time:  Tuesday December 22 2021 09:05:06 EST Ventricular Rate:  45 PR Interval:    QRS Duration: 151 QT Interval:  488 QTC Calculation: 423 R Axis:   -81 Text Interpretation: Atrial fibrillation RBBB and LAFB Since last tracing Rate slower Confirmed by Calvert Cantor [phone]) on 12/22/2021 9:07:25 AM  Procedures .Critical Care Performed by: Truddie Hidden, MD Authorized by: Truddie Hidden, MD   Critical care provider statement:    Critical care time (minutes):  45   Critical care time was exclusive of:  Separately billable procedures and treating other patients   Critical care was necessary to treat or prevent imminent or life-threatening deterioration of the following conditions:  Circulatory  failure   Critical care was time spent personally by me on the following activities:  Development of treatment plan with patient or surrogate, discussions with consultants, evaluation of patient's response to treatment, examination of patient, ordering and review of laboratory studies, ordering and review of radiographic studies, ordering and performing treatments and interventions, pulse oximetry, re-evaluation of patient's condition and review of old charts   Care discussed with: admitting provider    Medications Ordered in the ED Medications  fentaNYL (SUBLIMAZE) injection 50 mcg (50 mcg Intravenous Given 12/22/21 0933)  iohexol (OMNIPAQUE) 300 MG/ML solution 100 mL (100 mLs Intravenous Contrast Given 12/22/21 1109)    Initial Impression and Plan  Patient here with epigastric  pain and slow afib on monitor which also may be causing some of his symptoms. Unclear if this is medication related as we do not have an up to date med list for him. RN to call facility to get a list. Will check labs, CT and give pain medication for comfort. His BP is currently normal, will monitor closely.  ED Course   Clinical Course as of 12/22/21 1435  Tue Dec 22, 2021  0942 CBC shows anemia, about at baseline. Normal WBC and PLT.  [CS]  1010 CMP, lipase are unremarkable. Trop is neg x 1  [CS]  1048 Digoxin is therapeutic range.  [CS]  5329 CT shows cholelithiasis, will check [place]. Remains bradycardic.  [CS]  9242 [place] is neg for acute cholecystitis causing his epigastric pain. Will discuss with cardiology, anticipate admission for further management.  [CS]  6834 HDQQI with Trish, Cardiology, who will have Dr. Debara Pickett call back.  [CS]  2979 Repeat trop is unchanged.  [CS]  8921 Dr. Debara Pickett is at bedside now. Recommends admission for monitoring, holding rate control meds. Hospitalist paged.  [CS]  1941 Spoke with Dr. Marylyn Ishihara, Hospitalist, who will come evaluate the patient.  [CS]    Clinical Course User Index [CS] Truddie Hidden, MD     MDM Rules/Calculators/A&P Medical Decision Making Problems Addressed: Epigastric pain: acute illness or injury with systemic symptoms Symptomatic bradycardia: acute illness or injury that poses a threat to life or bodily functions  Amount and/or Complexity of Data Reviewed Labs: ordered. Decision-making details documented in ED Course. Radiology: ordered and independent interpretation performed. Decision-making details documented in ED Course. ECG/medicine tests: ordered and independent interpretation performed. Decision-making details documented in ED Course.  Risk Decision regarding hospitalization.  Critical Care Total time providing critical care: 30-74 minutes   Final Clinical Impression(s) / ED Diagnoses Final diagnoses:  Symptomatic bradycardia  Epigastric pain    Rx / DC Orders ED Discharge Orders     None        Truddie Hidden, MD 12/22/21 1435

## 2021-12-23 ENCOUNTER — Observation Stay (HOSPITAL_COMMUNITY): Payer: Medicare Other

## 2021-12-23 DIAGNOSIS — R1013 Epigastric pain: Secondary | ICD-10-CM | POA: Diagnosis present

## 2021-12-23 DIAGNOSIS — E119 Type 2 diabetes mellitus without complications: Secondary | ICD-10-CM | POA: Diagnosis present

## 2021-12-23 DIAGNOSIS — Z7984 Long term (current) use of oral hypoglycemic drugs: Secondary | ICD-10-CM | POA: Diagnosis not present

## 2021-12-23 DIAGNOSIS — I10 Essential (primary) hypertension: Secondary | ICD-10-CM | POA: Diagnosis present

## 2021-12-23 DIAGNOSIS — Z7901 Long term (current) use of anticoagulants: Secondary | ICD-10-CM | POA: Diagnosis not present

## 2021-12-23 DIAGNOSIS — Z20822 Contact with and (suspected) exposure to covid-19: Secondary | ICD-10-CM | POA: Diagnosis present

## 2021-12-23 DIAGNOSIS — Z85038 Personal history of other malignant neoplasm of large intestine: Secondary | ICD-10-CM | POA: Diagnosis not present

## 2021-12-23 DIAGNOSIS — I4891 Unspecified atrial fibrillation: Secondary | ICD-10-CM

## 2021-12-23 DIAGNOSIS — K5900 Constipation, unspecified: Secondary | ICD-10-CM | POA: Diagnosis present

## 2021-12-23 DIAGNOSIS — Z79899 Other long term (current) drug therapy: Secondary | ICD-10-CM | POA: Diagnosis not present

## 2021-12-23 DIAGNOSIS — Z8249 Family history of ischemic heart disease and other diseases of the circulatory system: Secondary | ICD-10-CM | POA: Diagnosis not present

## 2021-12-23 DIAGNOSIS — I452 Bifascicular block: Secondary | ICD-10-CM | POA: Diagnosis present

## 2021-12-23 DIAGNOSIS — Z87891 Personal history of nicotine dependence: Secondary | ICD-10-CM | POA: Diagnosis not present

## 2021-12-23 DIAGNOSIS — L309 Dermatitis, unspecified: Secondary | ICD-10-CM | POA: Diagnosis present

## 2021-12-23 DIAGNOSIS — F419 Anxiety disorder, unspecified: Secondary | ICD-10-CM | POA: Diagnosis present

## 2021-12-23 DIAGNOSIS — D539 Nutritional anemia, unspecified: Secondary | ICD-10-CM | POA: Diagnosis present

## 2021-12-23 DIAGNOSIS — E785 Hyperlipidemia, unspecified: Secondary | ICD-10-CM | POA: Diagnosis present

## 2021-12-23 DIAGNOSIS — Z888 Allergy status to other drugs, medicaments and biological substances status: Secondary | ICD-10-CM | POA: Diagnosis not present

## 2021-12-23 DIAGNOSIS — Z9049 Acquired absence of other specified parts of digestive tract: Secondary | ICD-10-CM | POA: Diagnosis not present

## 2021-12-23 DIAGNOSIS — Z6832 Body mass index (BMI) 32.0-32.9, adult: Secondary | ICD-10-CM | POA: Diagnosis not present

## 2021-12-23 DIAGNOSIS — R001 Bradycardia, unspecified: Secondary | ICD-10-CM | POA: Diagnosis present

## 2021-12-23 DIAGNOSIS — N400 Enlarged prostate without lower urinary tract symptoms: Secondary | ICD-10-CM | POA: Diagnosis present

## 2021-12-23 DIAGNOSIS — I890 Lymphedema, not elsewhere classified: Secondary | ICD-10-CM | POA: Diagnosis present

## 2021-12-23 LAB — COMPREHENSIVE METABOLIC PANEL
ALT: 13 U/L (ref 0–44)
AST: 14 U/L — ABNORMAL LOW (ref 15–41)
Albumin: 3.1 g/dL — ABNORMAL LOW (ref 3.5–5.0)
Alkaline Phosphatase: 78 U/L (ref 38–126)
Anion gap: 6 (ref 5–15)
BUN: 17 mg/dL (ref 8–23)
CO2: 25 mmol/L (ref 22–32)
Calcium: 8.4 mg/dL — ABNORMAL LOW (ref 8.9–10.3)
Chloride: 102 mmol/L (ref 98–111)
Creatinine, Ser: 1.04 mg/dL (ref 0.61–1.24)
GFR, Estimated: >60 mL/min (ref >60)
Glucose, Bld: 123 mg/dL — ABNORMAL HIGH (ref 70–99)
Potassium: 4.1 mmol/L (ref 3.5–5.1)
Sodium: 133 mmol/L — ABNORMAL LOW (ref 135–145)
Total Bilirubin: 0.9 mg/dL (ref 0.3–1.2)
Total Protein: 6.8 g/dL (ref 6.5–8.1)

## 2021-12-23 LAB — CBC
HCT: 29.6 % — ABNORMAL LOW (ref 39.0–52.0)
Hemoglobin: 9.6 g/dL — ABNORMAL LOW (ref 13.0–17.0)
MCH: 35 pg — ABNORMAL HIGH (ref 26.0–34.0)
MCHC: 32.4 g/dL (ref 30.0–36.0)
MCV: 108 fL — ABNORMAL HIGH (ref 80.0–100.0)
Platelets: 156 10*3/uL (ref 150–400)
RBC: 2.74 MIL/uL — ABNORMAL LOW (ref 4.22–5.81)
RDW: 15.9 % — ABNORMAL HIGH (ref 11.5–15.5)
WBC: 5.9 10*3/uL (ref 4.0–10.5)
nRBC: 0 % (ref 0.0–0.2)

## 2021-12-23 LAB — GLUCOSE, CAPILLARY
Glucose-Capillary: 117 mg/dL — ABNORMAL HIGH (ref 70–99)
Glucose-Capillary: 162 mg/dL — ABNORMAL HIGH (ref 70–99)
Glucose-Capillary: 178 mg/dL — ABNORMAL HIGH (ref 70–99)
Glucose-Capillary: 179 mg/dL — ABNORMAL HIGH (ref 70–99)

## 2021-12-23 LAB — ECHOCARDIOGRAM COMPLETE
AR max vel: 1.99 cm2
AV Area VTI: 2.12 cm2
AV Area mean vel: 1.98 cm2
AV Mean grad: 3 mmHg
AV Peak grad: 5.6 mmHg
Ao pk vel: 1.19 m/s
Height: 75.5 in
Weight: 4075.2 oz

## 2021-12-23 NOTE — Progress Notes (Signed)
PROGRESS NOTE  RICK CARRUTHERS KGM:[id number] DOB: 1951-01-23 DOA: 12/22/2021 PCP: Merryl Hacker, No  Brief History    [name] is a 71 y.o. male with medical history significant of DM2, HTN, HLD, BPH, anxiety, afib. Presenting with epigastric abdominal pain and dizziness. He reports that he started feeling nauseous this morning. He was feeling some "pressure against" his stomach and it seemed to be in the epigastric and RUQ area. His nausea worsened but he didn't vomit. He began feeling lightheaded, dizzy and weak. He called for EMS. When they found him, he was bradycardic in the 30s. They brought him to the ED for evaluation. He denies any other aggravating or alleviating factors.    ED Course: He was bradycardic. BP was ok. Lipase was negative. Abdominal imaging showed cholelithiasis w/o cholecystitis. Cardiology was consulted for bradycardia. [facility] was called for admission.   Cardiology was consulted. The patient has been taken off of all rate limiting medications. Will continue to observe the patient while these medications are being allowed to "wash out". HR continues to be in the 50's at the highest.  Consultants  Cardiology  Procedures    Antibiotics   Anti-infectives (From admission, onward)    None      Subjective  The patient is resting comfortably. No new complaints.  Objective   Vitals:  Vitals:   12/23/21 0604 12/23/21 1240  BP: 124/65 (!) 131/58  Pulse: (!) 57 (!) 51  Resp: 20 18  Temp: 98.4 F (36.9 C) 98.6 F (37 C)  SpO2: 96% 100%    Exam:  Constitutional:  The patient is awake, alert, and oriented x 3. No acute distress. Respiratory:  No increased work of breathing. No wheezes, rales, or rhonchi No tactile fremitus Cardiovascular:  Regular rate and rhythm/Rate is slow No murmurs, ectopy, or gallups. No lateral PMI. No thrills. Abdomen:  Abdomen is soft, non-tender, non-distended No hernias, masses, or organomegaly Normoactive bowel sounds.   Musculoskeletal:  No cyanosis, clubbing, or edema Skin:  No rashes, lesions, ulcers palpation of skin: no induration or nodules Neurologic:  CN 2-12 intact Sensation all 4 extremities intact Psychiatric:  Mental status Mood, affect appropriate Orientation to person, place, time  judgment and insight appear intact  I have personally reviewed the following:   Today's Data  Vitals  Lab Data  CMP CBC  Micro Data    Imaging  CT abdomen and pelvis  Cardiology Data  EKG Echocardiogram  Other Data    Scheduled Meds:  atorvastatin  20 mg Oral Daily   docusate sodium  100 mg Oral BID   enoxaparin (LOVENOX) injection  60 mg Subcutaneous U44I   folic acid  1 mg Oral Once per day on Sun Mon Tue Wed Thu Sat   insulin aspart  0-15 Units Subcutaneous TID WC   insulin aspart  0-5 Units Subcutaneous QHS   nystatin  1 application Topical BID   pantoprazole  40 mg Oral Daily   polyethylene glycol  17 g Oral Daily   tamsulosin  0.4 mg Oral Daily    Principal Problem:   Symptomatic bradycardia Active Problems:   Atrial fibrillation with slow ventricular response (HCC)   LOS: 0 days   A & P  Symptomatic bradycardia Afib     - place in obs, tele     - cardiology onboard, appreciate assistance     - hold dig, metoprolol     - follow echo   Abdominal pain     -  w/u thus far is negative     - his symptoms seem to have improved     - if they return, can consider HIDA scan     - right now, will have protoxin ordered and follow   DM2     - A1c, SSI, glucose check, DM diet   HTN     - holding metoprolol for now; will have PRN hydralazine available   HLD     - continue statin   BPH     - continue flomax   Macrocytic anemia     - at baseline, no evidence of bleed, follow   Eczematous dermatitis     - continue home regimen (MTX on Fridays)   DVT prophylaxis: lovenox  Code Status: FULL  Family Communication: None at bedside  Disposition: tbd   Kathalina Ostermann,  DO [facility] Direct contact: see [email or web address]  7PM-7AM contact night coverage as above 12/23/2021, 6:26 PM  LOS: 0 days

## 2021-12-23 NOTE — Progress Notes (Signed)
Progress Note  Patient Name: [name] Date of Encounter: 12/23/2021  [facility] Cardiologist: Pixie Casino, MD   Subjective   Asymptomatic with bradycardia, still with abdominal discomfort. Awaiting echo.  Inpatient Medications    Scheduled Meds:  atorvastatin  20 mg Oral Daily   docusate sodium  100 mg Oral BID   enoxaparin (LOVENOX) injection  60 mg Subcutaneous V40G   folic acid  1 mg Oral Once per day on Sun Mon Tue Wed Thu Sat   insulin aspart  0-15 Units Subcutaneous TID WC   insulin aspart  0-5 Units Subcutaneous QHS   nystatin  1 application Topical BID   pantoprazole  40 mg Oral Daily   polyethylene glycol  17 g Oral Daily   tamsulosin  0.4 mg Oral Daily   Continuous Infusions:  PRN Meds: acetaminophen **OR** acetaminophen, hydrALAZINE, hydrOXYzine, ondansetron **OR** ondansetron (ZOFRAN) IV   Vital Signs    Vitals:   12/22/21 1758 12/22/21 2110 12/23/21 0216 12/23/21 0604  BP: (!) 130/52 (!) 127/54 135/60 124/65  Pulse: (!) 58 (!) 41 (!) 54 (!) 57  Resp:  18 20 20   Temp: (!) 97.5 F (36.4 C) 98.4 F (36.9 C) 97.6 F (36.4 C) 98.4 F (36.9 C)  TempSrc: Oral Oral Oral Oral  SpO2: 100% 100% 99% 96%  Weight: 115.5 kg     Height: 6' 3.5" (1.918 m)       Intake/Output Summary (Last 24 hours) at 12/23/2021 0901 Last data filed at 12/23/2021 0700 Gross per 24 hour  Intake 240 ml  Output 380 ml  Net -140 ml   Last 3 Weights 12/22/2021 12/22/2021 07/17/2020  Weight (lbs) 254 lb 11.2 oz 260 lb 264 lb  Weight (kg) 115.531 kg 117.935 kg 119.75 kg      Telemetry    Afib with VR 40-50s nocturnally, 50-60s during waking hours - Personally Reviewed  ECG    No new tracings - Personally Reviewed  Physical Exam   GEN: No acute distress.   Neck: No JVD Cardiac: irregular rhythm bradycardic rate Respiratory: Clear to auscultation bilaterally. GI: Soft, nontender, non-distended  MS: mild swelling B LE, chronic skin changes, red 3/4 to  knee Neuro:  Nonfocal  Psych: Normal affect   Labs    High Sensitivity Troponin:   Recent Labs  Lab 12/22/21 0918 12/22/21 1257  TROPONINIHS 11 10     Chemistry Recent Labs  Lab 12/22/21 0918 12/22/21 1841 12/23/21 0333  NA 134*  --  133*  K 4.4  --  4.1  CL 103  --  102  CO2 23  --  25  GLUCOSE 161*  --  123*  BUN 22  --  17  CREATININE 1.09 0.90 1.04  CALCIUM 8.5*  --  8.4*  PROT 7.3  --  6.8  ALBUMIN 3.4*  --  3.1*  AST 16  --  14*  ALT 14  --  13  ALKPHOS 80  --  78  BILITOT 0.8  --  0.9  GFRNONAA >60 >60 >60  ANIONGAP 8  --  6    Lipids No results for input(s): CHOL, TRIG, HDL, LABVLDL, LDLCALC, CHOLHDL in the last 168 hours.  Hematology Recent Labs  Lab 12/22/21 0918 12/22/21 1841 12/23/21 0333  WBC 6.3 6.6 5.9  RBC 2.81* 3.21* 2.74*  HGB 10.0* 11.4* 9.6*  HCT 30.0* 35.7* 29.6*  MCV 106.8* 111.2* 108.0*  MCH 35.6* 35.5* 35.0*  MCHC 33.3 31.9 32.4  RDW 15.8* 15.8* 15.9*  PLT 156 158 156   Thyroid No results for input(s): TSH, FREET4 in the last 168 hours.  BNPNo results for input(s): BNP, PROBNP in the last 168 hours.  DDimer No results for input(s): DDIMER in the last 168 hours.   Radiology    CT Abdomen Pelvis W Contrast  Result Date: 12/22/2021 CLINICAL DATA:  Epigastric abdominal pain. EXAM: CT ABDOMEN AND PELVIS WITH CONTRAST TECHNIQUE: Multidetector CT imaging of the abdomen and pelvis was performed using the standard protocol following bolus administration of intravenous contrast. CONTRAST:  164mL OMNIPAQUE IOHEXOL 300 MG/ML  SOLN COMPARISON:  January 26, 2017. FINDINGS: Lower chest: No acute abnormality. Hepatobiliary: Cholelithiasis is noted without biliary dilatation. Liver is unremarkable. Pancreas: Unremarkable. No pancreatic ductal dilatation or surrounding inflammatory changes. Spleen: Normal in size without focal abnormality. Adrenals/Urinary Tract: Adrenal glands appear normal. Probable left parapelvic cysts are noted. No  hydronephrosis or renal obstruction is noted. Urinary bladder is unremarkable. Stomach/Bowel: Stomach is unremarkable. There is no evidence of bowel obstruction or inflammation. Diverticulosis of descending and sigmoid colon is noted without inflammation. Vascular/Lymphatic: Aortic atherosclerosis. No enlarged abdominal or pelvic lymph nodes. Reproductive: Prostate is unremarkable. Other: No abdominal wall hernia or abnormality. No abdominopelvic ascites. Musculoskeletal: No acute or significant osseous findings. IMPRESSION: Cholelithiasis without definite evidence of cholecystitis. Diverticulosis of descending and sigmoid colon without inflammation. No definite acute abnormality seen in the abdomen or pelvis. Aortic Atherosclerosis (ICD10-I70.0). Electronically Signed   By: Marijo Conception M.D.   On: 12/22/2021 11:33   US Abdomen Limited  Result Date: 12/22/2021 CLINICAL DATA:  Epigastric pain, gallstones EXAM: ULTRASOUND ABDOMEN LIMITED RIGHT UPPER QUADRANT COMPARISON:  CT abdomen 12/22/2021, 01/26/2017 FINDINGS: Gallbladder: Cholelithiasis. No gallbladder wall thickening or pericholecystic fluid. Small amount of gallbladder sludge. Negative sonographic Murphy sign. Common bile duct: Diameter: 6.6 mm Liver: No focal lesion identified. Increased hepatic parenchymal echogenicity. Portal vein is patent on color Doppler imaging with normal direction of blood flow towards the liver. Other: None. IMPRESSION: 1. Cholelithiasis without sonographic evidence of acute cholecystitis. 2. Increased hepatic echogenicity as can be seen with hepatic steatosis. Electronically Signed   By: Kathreen Devoid M.D.   On: 12/22/2021 12:29    Cardiac Studies   Echo pending  Patient Profile     71 y.o. male Afib, morbid obesity, HTN,, HLD, DM2, and family history of heart disease being seen for bradycardia.   Assessment & Plan    Atrial fibrillation with SVR Bradycardia in the 30-40s Bifascicular block (RBBB, LAFB) - PTA,  was on 75 mg metoprolol BID and digoxin - digoxin level 1.1, this was discontinued - metoprolol was held - telemetry reviewed this morning and shows HR in the 40-50s during nocturnal hours and in the 50-60s during waking hours - if rates remain in the 60s, may continue holding BB - echo still pending   Chronic anticoagulation - has been maintained on eliquis - no bleeding issues - resume eliqius if no procedures planned   Hypertension - pressure appears controlled   Hyperlipidemia - continue lipitor 20 mg   Lower extremity edema - he was told he had lymphedema - also stated wound care clinic put him on diuretic with dramatic improvement in his lower extremity swelling - echo pending     For questions or updates, please contact [place] Please consult [email or web address] for contact info under        Signed, Ledora Bottcher, PA  12/23/2021, 9:01 AM

## 2021-12-23 NOTE — Consult Note (Signed)
WOC Nurse Consult Note: Patient receiving care in [place] Reason for Consult: BLE and left toes Wound type: Hemosiderin staining in the BLE with dry skin, no open wounds. Reddened area in between the second and third toe. No drainage Pressure Injury POA: NA Drainage (amount, consistency, odor) None Dressing procedure/placement/frequency: Clean the BLE with soap and water, pat dry, then apply Sween Moisturizing lotion (Pink top in clean supply room). Apply daily.  Place a small piece of dry gauze between the second and third toe and the wrap the end of the foot with a few turns of Kerlix. Change daily  Monitor the wound area(s) for worsening of condition such as: Signs/symptoms of infection, increase in size, development of or worsening of odor, development of pain, or increased pain at the affected locations.   Notify the medical team if any of these develop.  Thank you for the consult. [place] nurse will not follow at this time.   Please re-consult the [place] team if needed.  Cathlean Marseilles Tamala Julian, MSN, RN, [place], Lysle Pearl, [facility] Wound Treatment Associate Pager [phone]

## 2021-12-24 ENCOUNTER — Inpatient Hospital Stay (HOSPITAL_COMMUNITY): Payer: Medicare Other

## 2021-12-24 DIAGNOSIS — I4891 Unspecified atrial fibrillation: Secondary | ICD-10-CM | POA: Diagnosis not present

## 2021-12-24 DIAGNOSIS — R001 Bradycardia, unspecified: Secondary | ICD-10-CM | POA: Diagnosis not present

## 2021-12-24 LAB — GLUCOSE, CAPILLARY
Glucose-Capillary: 122 mg/dL — ABNORMAL HIGH (ref 70–99)
Glucose-Capillary: 124 mg/dL — ABNORMAL HIGH (ref 70–99)
Glucose-Capillary: 150 mg/dL — ABNORMAL HIGH (ref 70–99)
Glucose-Capillary: 129 mg/dL — ABNORMAL HIGH (ref 70–99)

## 2021-12-24 MED ORDER — CARBAMIDE PEROXIDE 6.5 % OT SOLN
5.0000 [drp] | Freq: Two times a day (BID) | OTIC | Status: DC
  Administered 2021-12-24 – 2021-12-25 (×2): 5 [drp] via OTIC
  Filled 2021-12-24: qty 15

## 2021-12-24 MED ORDER — APIXABAN 5 MG PO TABS
5.0000 mg | ORAL_TABLET | Freq: Two times a day (BID) | ORAL | Status: DC
  Administered 2021-12-24 – 2021-12-25 (×2): 5 mg via ORAL
  Filled 2021-12-24 (×2): qty 1

## 2021-12-24 NOTE — Progress Notes (Signed)
Progress Note  Patient Name: [name] Date of Encounter: 12/24/2021  [facility] Cardiologist: Pixie Casino, MD   Subjective   Afib currently with rates in the 50-60's of digoxin and metoprolol. Echo showed LVEF 60-65%, mild LAE, dilated aortic root to 47 mm.  Inpatient Medications    Scheduled Meds:  atorvastatin  20 mg Oral Daily   docusate sodium  100 mg Oral BID   enoxaparin (LOVENOX) injection  60 mg Subcutaneous [id number]   folic acid  1 mg Oral Once per day on Sun Mon Tue Wed Thu Sat   insulin aspart  0-15 Units Subcutaneous TID WC   insulin aspart  0-5 Units Subcutaneous QHS   nystatin  1 application Topical BID   pantoprazole  40 mg Oral Daily   polyethylene glycol  17 g Oral Daily   tamsulosin  0.4 mg Oral Daily   Continuous Infusions:  PRN Meds: acetaminophen **OR** acetaminophen, hydrALAZINE, hydrOXYzine, ondansetron **OR** ondansetron (ZOFRAN) IV   Vital Signs    Vitals:   12/23/21 1240 12/23/21 2138 12/24/21 0359 12/24/21 1432  BP: (!) 131/58 (!) 125/52 (!) 134/59 (!) 142/56  Pulse: (!) 51 70 60 70  Resp: 18   20  Temp: 98.6 F (37 C) 100 F (37.8 C) 99 F (37.2 C) 99.4 F (37.4 C)  TempSrc: Oral Oral Oral Oral  SpO2: 100% 93% 97% 99%  Weight:      Height:        Intake/Output Summary (Last 24 hours) at 12/24/2021 1507 Last data filed at 12/24/2021 1435 Gross per 24 hour  Intake --  Output 1250 ml  Net -1250 ml   Last 3 Weights 12/22/2021 12/22/2021 07/17/2020  Weight (lbs) 254 lb 11.2 oz 260 lb 264 lb  Weight (kg) 115.531 kg 117.935 kg 119.75 kg      Telemetry    Afib with rates in the 50-60's - personally reviewed  ECG    No new tracings - Personally Reviewed  Physical Exam   GEN: No acute distress.   Neck: No JVD Cardiac: irregular rhythm bradycardic rate Respiratory: Clear to auscultation bilaterally. GI: Soft, nontender, non-distended  MS: mild swelling B LE, chronic skin changes, red 3/4 to knee Neuro:  Nonfocal   Psych: Normal affect   Labs    High Sensitivity Troponin:   Recent Labs  Lab 12/22/21 0918 12/22/21 1257  TROPONINIHS 11 10     Chemistry Recent Labs  Lab 12/22/21 0918 12/22/21 1841 12/23/21 0333  NA 134*  --  133*  K 4.4  --  4.1  CL 103  --  102  CO2 23  --  25  GLUCOSE 161*  --  123*  BUN 22  --  17  CREATININE 1.09 0.90 1.04  CALCIUM 8.5*  --  8.4*  PROT 7.3  --  6.8  ALBUMIN 3.4*  --  3.1*  AST 16  --  14*  ALT 14  --  13  ALKPHOS 80  --  78  BILITOT 0.8  --  0.9  GFRNONAA >60 >60 >60  ANIONGAP 8  --  6    Lipids No results for input(s): CHOL, TRIG, HDL, LABVLDL, LDLCALC, CHOLHDL in the last 168 hours.  Hematology Recent Labs  Lab 12/22/21 0918 12/22/21 1841 12/23/21 0333  WBC 6.3 6.6 5.9  RBC 2.81* 3.21* 2.74*  HGB 10.0* 11.4* 9.6*  HCT 30.0* 35.7* 29.6*  MCV 106.8* 111.2* 108.0*  MCH 35.6* 35.5* 35.0*  MCHC  33.3 31.9 32.4  RDW 15.8* 15.8* 15.9*  PLT 156 158 156   Thyroid No results for input(s): TSH, FREET4 in the last 168 hours.  BNPNo results for input(s): BNP, PROBNP in the last 168 hours.  DDimer No results for input(s): DDIMER in the last 168 hours.   Radiology    ECHOCARDIOGRAM COMPLETE  Result Date: 12/23/2021    ECHOCARDIOGRAM REPORT   Patient Name:   [name] Date of Exam: 12/23/2021 Medical Rec #:  [id number]       Height:       75.5 in Accession #:    [id number]      Weight:       254.7 lb Date of Birth:  09-01-1951       BSA:          2.444 m Patient Age:    71 years        BP:           124/65 mmHg Patient Gender: M               HR:           57 bpm. Exam Location:  Inpatient Procedure: 2D Echo, Cardiac Doppler and Color Doppler Indications:    Afib  History:        Patient has prior history of Echocardiogram examinations, most                 recent 11/13/2016. Arrythmias:RBBB; Risk Factors:Hypertension and                 Diabetes.  Sonographer:    Glo Herring Referring Phys: [id number] [place]  1. Left  ventricular ejection fraction, by estimation, is 60 to 65%. The left ventricle has normal function. The left ventricle has no regional wall motion abnormalities. Left ventricular diastolic function could not be evaluated.  2. Right ventricular systolic function was not well visualized. The right ventricular size is normal. Tricuspid regurgitation signal is inadequate for assessing PA pressure.  3. Left atrial size was mildly dilated.  4. The mitral valve is normal in structure. No evidence of mitral valve regurgitation. No evidence of mitral stenosis.  5. The aortic valve is normal in structure. Aortic valve regurgitation is not visualized. No aortic stenosis is present.  6. Aortic dilatation noted. Aneurysm of the aortic root, measuring 47 mm.  7. The inferior vena cava is dilated in size with <50% respiratory variability, suggesting right atrial pressure of 15 mmHg. FINDINGS  Left Ventricle: Left ventricular ejection fraction, by estimation, is 60 to 65%. The left ventricle has normal function. The left ventricle has no regional wall motion abnormalities. The left ventricular internal cavity size was normal in size. There is  no left ventricular hypertrophy. Left ventricular diastolic function could not be evaluated due to atrial fibrillation. Left ventricular diastolic function could not be evaluated. Right Ventricle: The right ventricular size is normal. No increase in right ventricular wall thickness. Right ventricular systolic function was not well visualized. Tricuspid regurgitation signal is inadequate for assessing PA pressure. Left Atrium: Left atrial size was mildly dilated. Right Atrium: Right atrial size was normal in size. Pericardium: There is no evidence of pericardial effusion. Mitral Valve: The mitral valve is normal in structure. Mild mitral annular calcification. No evidence of mitral valve regurgitation. No evidence of mitral valve stenosis. Tricuspid Valve: The tricuspid valve is normal in  structure. Tricuspid valve regurgitation is not demonstrated. No evidence of tricuspid stenosis.  Aortic Valve: The aortic valve is normal in structure. Aortic valve regurgitation is not visualized. No aortic stenosis is present. Aortic valve mean gradient measures 3.0 mmHg. Aortic valve peak gradient measures 5.6 mmHg. Aortic valve area, by VTI measures 2.12 cm. Pulmonic Valve: The pulmonic valve was normal in structure. Pulmonic valve regurgitation is not visualized. No evidence of pulmonic stenosis. Aorta: Aortic dilatation noted. There is an aneurysm involving the aortic root measuring 47 mm. Venous: The inferior vena cava is dilated in size with less than 50% respiratory variability, suggesting right atrial pressure of 15 mmHg. IAS/Shunts: No atrial level shunt detected by color flow Doppler.  LEFT VENTRICLE PLAX 2D LVOT diam:     2.20 cm LV SV:         52 LV SV Index:   21 LVOT Area:     3.80 cm  IVC IVC diam: 3.60 cm LEFT ATRIUM             Index LA Vol (A2C):   76.0 ml 31.10 ml/m LA Vol (A4C):   86.2 ml 35.27 ml/m LA Biplane Vol: 87.6 ml 35.84 ml/m  AORTIC VALVE AV Area (Vmax):    1.99 cm AV Area (Vmean):   1.98 cm AV Area (VTI):     2.12 cm AV Vmax:           118.67 cm/s AV Vmean:          79.767 cm/s AV VTI:            0.244 m AV Peak Grad:      5.6 mmHg AV Mean Grad:      3.0 mmHg LVOT Vmax:         61.97 cm/s LVOT Vmean:        41.567 cm/s LVOT VTI:          0.136 m LVOT/AV VTI ratio: 0.56  AORTA Ao Root diam: 4.70 cm  SHUNTS Systemic VTI:  0.14 m Systemic Diam: 2.20 cm Fransico Him MD Electronically signed by Fransico Him MD Signature Date/Time: 12/23/2021/1:39:38 PM    Final     Cardiac Studies   See above  Patient Profile     71 y.o. male Afib, morbid obesity, HTN,, HLD, DM2, and family history of heart disease being seen for bradycardia.   Assessment & Plan    Atrial fibrillation with SVR Bradycardia in the 30-40s Bifascicular block (RBBB, LAFB) - PTA, was on 75 mg metoprolol BID  and digoxin - digoxin level 1.1, this was discontinued - metoprolol was held - telemetry reviewed this morning and shows HR up to 50-60s during waking hours - echo reassuring with normal LV function - as he has evidence of underlying conduction disease (RBBB, LAFB) - would continue to hold metoprolol and digoxin, probably indefinitely  Chronic anticoagulation - has been maintained on eliquis - no bleeding issues, ok to resume  Hypertension - pressure appears controlled  Hyperlipidemia - continue lipitor 20 mg  Lower extremity edema - he was told he had lymphedema - also stated wound care clinic put him on diuretic with dramatic improvement in his lower extremity swelling  [facility] will sign off.   Medication Recommendations:  as above Other recommendations (labs, testing, etc):  avoid AVN blockers Follow up as an outpatient:  Dr. Debara Pickett or APP    For questions or updates, please contact [facility] Please consult [email or web address] for contact info under   Pixie Casino, MD, [place], [place]  Medical Director of the [facility] Diplomate of the American Board of Clinical Lipidology Attending Cardiologist  Direct Dial: [phone]   Fax: [phone]  Website:  [email or web address]  Pixie Casino, MD  12/24/2021, 3:07 PM

## 2021-12-24 NOTE — Progress Notes (Signed)
PROGRESS NOTE  [name]:[id number] DOB: Jul 06, 1951 DOA: 12/22/2021 PCP: Merryl Hacker, No  Brief History    [name] is a 71 y.o. male with medical history significant of DM2, HTN, HLD, BPH, anxiety, afib. Presenting with epigastric abdominal pain and dizziness. He reports that he started feeling nauseous this morning. He was feeling some "pressure against" his stomach and it seemed to be in the epigastric and RUQ area. His nausea worsened but he didn't vomit. He began feeling lightheaded, dizzy and weak. He called for EMS. When they found him, he was bradycardic in the 30s. They brought him to the ED for evaluation. He denies any other aggravating or alleviating factors.    ED Course: He was bradycardic. BP was ok. Lipase was negative. Abdominal imaging showed cholelithiasis w/o cholecystitis. Cardiology was consulted for bradycardia. TRH was called for admission.   Cardiology was consulted. The patient has been taken off of all rate limiting medications. Will continue to observe the patient while these medications are being allowed to "wash out". HR continues to be in the 50's at the highest.  The patient is resting comfortably. No new complaints. HR up slightly. But at the low 60's at the highest. Will ambulate patient and check HR to see if his heart rate can rise appropriately.  Consultants  Cardiology  Procedures    Antibiotics   Anti-infectives (From admission, onward)    None      Subjective  The patient is resting comfortably. He is complaining of chronic problem of wax in his right ear.  Objective   Vitals:  Vitals:   12/24/21 0359 12/24/21 1432  BP: (!) 134/59 (!) 142/56  Pulse: 60 70  Resp:  20  Temp: 99 F (37.2 C) 99.4 F (37.4 C)  SpO2: 97% 99%    Exam:  Constitutional:  The patient is awake, alert, and oriented x 3. No acute distress. Respiratory:  No increased work of breathing. No wheezes, rales, or rhonchi No tactile  fremitus Cardiovascular:  Regular rate and rhythm/Rate is slow No murmurs, ectopy, or gallups. No lateral PMI. No thrills. Abdomen:  Abdomen is soft, non-tender, non-distended No hernias, masses, or organomegaly Normoactive bowel sounds.  Musculoskeletal:  No cyanosis, clubbing, or edema Skin:  No rashes, lesions, ulcers palpation of skin: no induration or nodules Neurologic:  CN 2-12 intact Sensation all 4 extremities intact Psychiatric:  Mental status Mood, affect appropriate Orientation to person, place, time  judgment and insight appear intact  I have personally reviewed the following:   Today's Data  Vitals  Lab Data  CMP CBC  Micro Data    Imaging  CT abdomen and pelvis  Cardiology Data  EKG Echocardiogram  Other Data    Scheduled Meds:  apixaban  5 mg Oral BID   atorvastatin  20 mg Oral Daily   carbamide peroxide  5 drop Right EAR BID   docusate sodium  100 mg Oral BID   folic acid  1 mg Oral Once per day on Sun Mon Tue Wed Thu Sat   insulin aspart  0-15 Units Subcutaneous TID WC   insulin aspart  0-5 Units Subcutaneous QHS   nystatin  1 application Topical BID   pantoprazole  40 mg Oral Daily   polyethylene glycol  17 g Oral Daily   tamsulosin  0.4 mg Oral Daily    Principal Problem:   Symptomatic bradycardia Active Problems:   Atrial fibrillation with slow ventricular response (HCC)   LOS: 1 day  A & P  Symptomatic bradycardia Afib     - place in obs, tele     - cardiology onboard, appreciate assistance     - hold dig, metoprolol     - follow echo    - Will ambulate patient and check HR to see if it can rise appropriately with exercise.   Abdominal pain     - w/u thus far is negative     - his symptoms seem to have improved     - if they return, can consider HIDA scan     - right now, will have protoxin ordered and follow     -Resolved   DM2     - A1c, SSI, glucose check, DM diet   HTN     - holding metoprolol for now;  will have PRN hydralazine available   HLD     - continue statin   BPH     - continue flomax   Macrocytic anemia     - at baseline, no evidence of bleed, follow   Eczematous dermatitis     - continue home regimen (MTX on Fridays)  I have seen and examined this patient myself. I have spent 34 minutes in his evaluation ad care.   DVT prophylaxis: lovenox  Code Status: FULL  Family Communication: None at bedside  Disposition: tbd  Allen Sutphen, DO [facility] Direct contact: see [email or web address]  7PM-7AM contact night coverage as above 12/24/2021, 4:47 PM  LOS: 0 days

## 2021-12-25 DIAGNOSIS — R001 Bradycardia, unspecified: Secondary | ICD-10-CM | POA: Diagnosis not present

## 2021-12-25 LAB — GLUCOSE, CAPILLARY
Glucose-Capillary: 112 mg/dL — ABNORMAL HIGH (ref 70–99)
Glucose-Capillary: 148 mg/dL — ABNORMAL HIGH (ref 70–99)
Glucose-Capillary: 118 mg/dL — ABNORMAL HIGH (ref 70–99)

## 2021-12-25 MED ORDER — PANTOPRAZOLE SODIUM 40 MG PO TBEC: 40.0000 mg | DELAYED_RELEASE_TABLET | Freq: Every day | ORAL | 0 refills | Status: DC

## 2021-12-25 MED ORDER — CARBAMIDE PEROXIDE 6.5 % OT SOLN: 5.0000 [drp] | Freq: Two times a day (BID) | OTIC | 0 refills | Status: DC

## 2021-12-25 MED ORDER — APIXABAN 5 MG PO TABS: 5.0000 mg | ORAL_TABLET | Freq: Two times a day (BID) | ORAL | 0 refills | Status: DC

## 2021-12-25 NOTE — Discharge Summary (Signed)
Physician Discharge Summary  [name] DXA:[id number] DOB: 08-Nov-1951 DOA: 12/22/2021  PCP: Pcp, No  Admit date: 12/22/2021 Discharge date: 12/25/2021  Recommendations for Outpatient Follow-up:  Discharge to home Follow up with PCP in 7-10 days. Check chemistry on that visit and report it to PCP. Follow up with Dr. Debara Pickett in 2-4 weeks. Stop digoxin and metoprolol  Discharge Diagnoses: Principal diagnosis is #1 Atrial fibrillation with slow ventricular response.  Abdominal pain due to constipation DM II Hypertension Hyperlipidemia Macrocytic anemia Eczematous dermatitis  Discharge Condition: Fair  Disposition: Home  Diet recommendation: Heart healthy with modified carbohydrates  Filed Weights   12/22/21 0910 12/22/21 1758  Weight: 117.9 kg 115.5 kg    History of present illness:  [name] is a 70 y.o. male with medical history significant of DM2, HTN, HLD, BPH, anxiety, afib. Presenting with epigastric abdominal pain and dizziness. He reports that he started feeling nauseous this morning. He was feeling some "pressure against" his stomach and it seemed to be in the epigastric and RUQ area. His nausea worsened but he didn't vomit. He began feeling lightheaded, dizzy and weak. He called for EMS. When they found him, he was bradycardic in the 30s. They brought him to the ED for evaluation. He denies any other aggravating or alleviating factors.    ED Course: He was bradycardic. BP was ok. Lipase was negative. Abdominal imaging showed cholelithiasis w/o cholecystitis. Cardiology was consulted for bradycardia. TRH was called for admission.   Hospital Course:   [name] is a 71 y.o. male with medical history significant of DM2, HTN, HLD, BPH, anxiety, afib. Presenting with epigastric abdominal pain and dizziness. He reports that he started feeling nauseous this morning. He was feeling some "pressure against" his stomach and it seemed to be in the epigastric and RUQ  area. His nausea worsened but he didn't vomit. He began feeling lightheaded, dizzy and weak. He called for EMS. When they found him, he was bradycardic in the 30s. They brought him to the ED for evaluation. He denies any other aggravating or alleviating factors.    ED Course: He was bradycardic. BP was ok. Lipase was negative. Abdominal imaging showed cholelithiasis w/o cholecystitis. Cardiology was consulted for bradycardia. TRH was called for admission.    Cardiology was consulted. The patient has been taken off of all rate limiting medications. Will continue to observe the patient while these medications are being allowed to "wash out". HR continues to be in the 50's at the highest.   The patient is resting comfortably. No new complaints. HR up slightly. But at the low 60's at the highest. The patient's HR is better today. Heart rates as high as 73 and 85 have been recorded.  The patient will be discharged to home in fair condition. He will follow up with cardiology as outpatient.  Today's assessment: S: The patient is resting comfortably. He states that he feels well. No new complaints.  O: Vitals:  Vitals:   12/24/21 2018 12/25/21 0456  BP: (!) 155/62 (!) 116/56  Pulse: 70 (!) 49  Resp:  18  Temp: 99.8 F (37.7 C) 98.1 F (36.7 C)  SpO2: 100% 96%    Exam:  Constitutional:  The patient is awake, alert, and oriented x 3. No acute distress. Respiratory:  No increased work of breathing. No wheezes, rales, or rhonchi No tactile fremitus Cardiovascular:  Regular rate and rhythm No murmurs, ectopy, or gallups. No lateral PMI. No thrills. Abdomen:  Abdomen is  soft, non-tender, non-distended No hernias, masses, or organomegaly Normoactive bowel sounds.  Musculoskeletal:  No cyanosis, clubbing, or edema Skin:  No rashes, lesions, ulcers palpation of skin: no induration or nodules Neurologic:  CN 2-12 intact Sensation all 4 extremities intact Psychiatric:  Mental  status Mood, affect appropriate Orientation to person, place, time  judgment and insight appear intact   Discharge Instructions  Discharge Instructions     Activity as tolerated - No restrictions   Complete by: As directed    Call MD for:  persistant nausea and vomiting   Complete by: As directed    Call MD for:  severe uncontrolled pain   Complete by: As directed    Call MD for:  temperature >100.4   Complete by: As directed    Diet - low sodium heart healthy   Complete by: As directed    Discharge instructions   Complete by: As directed    Discharge to home. Stop digoxin and metoprolol. Follow up with PCP in 7-10 days. Have chemistry checked on that visit. Follow up with Dr. Debara Pickett in 2-4 weeks.   Discharge wound care:   Complete by: As directed    Clean the BLE with soap and water, pat dry, then apply Sween Moisturizing lotion (Pink top in clean supply room). Apply daily.  Place a small piece of dry gauze between the second and third toe and the wrap the end of the foot with a few turns of Kerlix. Change daily   Increase activity slowly   Complete by: As directed       Allergies as of 12/25/2021       Reactions   Other    [name] bandage (self-gripping) - erythema, itching        Medication List     STOP taking these medications    busPIRone 7.5 MG tablet Commonly known as: BUSPAR   digoxin 0.125 MG tablet Commonly known as: LANOXIN   digoxin 0.25 MG tablet Commonly known as: LANOXIN   metoprolol tartrate 25 MG tablet Commonly known as: LOPRESSOR   Metoprolol Tartrate 75 MG Tabs       TAKE these medications    apixaban 5 MG Tabs tablet Commonly known as: ELIQUIS Take 1 tablet (5 mg total) by mouth 2 (two) times daily.   atorvastatin 20 MG tablet Commonly known as: LIPITOR Take 20 mg by mouth daily.   carbamide peroxide 6.5 % OTIC solution Commonly known as: DEBROX Place 5 drops into the right ear 2 (two) times daily.    dextrose 40 % Gel Commonly known as: GLUTOSE Take 1 Tube by mouth as needed for low blood sugar.   docusate sodium 100 MG capsule Commonly known as: COLACE Take 1 capsule (100 mg total) by mouth 2 (two) times daily.   folic acid 1 MG tablet Commonly known as: FOLVITE Take 1 mg by mouth See admin instructions. 1mg  everyday EXCEPT none on Fridays.   glipiZIDE 10 MG tablet Commonly known as: GLUCOTROL Take 10 mg by mouth 2 (two) times daily before a meal.   hydrOXYzine 25 MG tablet Commonly known as: ATARAX Take 1 tablet (25 mg total) by mouth every 8 (eight) hours as needed for itching. Use caution as this medication may make you drowsy   Januvia 100 MG tablet Generic drug: sitaGLIPtin Take 100 mg by mouth daily.   metFORMIN 1000 MG tablet Commonly known as: GLUCOPHAGE Take 1,000 mg by mouth 2 (two) times daily with a meal.  methotrexate 2.5 MG tablet Commonly known as: RHEUMATREX Take 10 mg by mouth once a week. Takes on Fridays.   miconazole 2 % powder Commonly known as: MICOTIN Apply 1 application topically See admin instructions. 1 application topical twice daily And 1 application daily to groin, stomach, thighs, buttocks area for chafing.   pantoprazole 40 MG tablet Commonly known as: PROTONIX Take 1 tablet (40 mg total) by mouth daily. Start taking on: December 26, 2021   polyethylene glycol 17 g packet Commonly known as: MiraLax Take 17 g by mouth daily. Hold for diarrhea   PRESCRIPTION MEDICATION Apply 1 application topically daily. Tac 0.1% Cetaphil 1:4 Apply to lower legs   tamsulosin 0.4 MG Caps capsule Commonly known as: FLOMAX Take 1 capsule (0.4 mg total) by mouth daily.   triamcinolone cream 0.1 % Commonly known as: KENALOG Apply 1 application topically See admin instructions. 1 application topical twice daily to itchy areas And 1 extra application daily as needed for dermatitis               Discharge Care Instructions  (From  admission, onward)           Start     Ordered   12/25/21 0000  Discharge wound care:       Comments: Clean the BLE with soap and water, pat dry, then apply Sween Moisturizing lotion (Pink top in clean supply room). Apply daily.  Place a small piece of dry gauze between the second and third toe and the wrap the end of the foot with a few turns of Kerlix. Change daily   12/25/21 1446           Allergies  Allergen Reactions   Other     [name] bandage (self-gripping) - erythema, itching    The results of significant diagnostics from this hospitalization (including imaging, microbiology, ancillary and laboratory) are listed below for reference.    Significant Diagnostic Studies: DG Abd 1 View  Result Date: 12/24/2021 CLINICAL DATA:  Constipation. EXAM: ABDOMEN - 1 VIEW COMPARISON:  Abdominopelvic CT 2 days ago. FINDINGS: Gaseous gastric distension similar to recent CT. There is no small bowel dilatation or evidence of obstruction. Small to moderate stool burden throughout the colon. There is no abnormal rectal distention. Known gallstone visualized in the right upper quadrant. IMPRESSION: Small to moderate colonic stool burden, decreased stool burden from prior CT. Gaseous gastric distension similar to prior. No small bowel dilatation or evidence of obstruction. Electronically Signed   By: Keith Rake M.D.   On: 12/24/2021 15:56   CT Abdomen Pelvis W Contrast  Result Date: 12/22/2021 CLINICAL DATA:  Epigastric abdominal pain. EXAM: CT ABDOMEN AND PELVIS WITH CONTRAST TECHNIQUE: Multidetector CT imaging of the abdomen and pelvis was performed using the standard protocol following bolus administration of intravenous contrast. CONTRAST:  136mL OMNIPAQUE IOHEXOL 300 MG/ML  SOLN COMPARISON:  January 26, 2017. FINDINGS: Lower chest: No acute abnormality. Hepatobiliary: Cholelithiasis is noted without biliary dilatation. Liver is unremarkable. Pancreas: Unremarkable. No  pancreatic ductal dilatation or surrounding inflammatory changes. Spleen: Normal in size without focal abnormality. Adrenals/Urinary Tract: Adrenal glands appear normal. Probable left parapelvic cysts are noted. No hydronephrosis or renal obstruction is noted. Urinary bladder is unremarkable. Stomach/Bowel: Stomach is unremarkable. There is no evidence of bowel obstruction or inflammation. Diverticulosis of descending and sigmoid colon is noted without inflammation. Vascular/Lymphatic: Aortic atherosclerosis. No enlarged abdominal or pelvic lymph nodes. Reproductive: Prostate is unremarkable. Other: No abdominal wall hernia or abnormality.  No abdominopelvic ascites. Musculoskeletal: No acute or significant osseous findings. IMPRESSION: Cholelithiasis without definite evidence of cholecystitis. Diverticulosis of descending and sigmoid colon without inflammation. No definite acute abnormality seen in the abdomen or pelvis. Aortic Atherosclerosis (ICD10-I70.0). Electronically Signed   By: Marijo Conception M.D.   On: 12/22/2021 11:33   US Abdomen Limited  Result Date: 12/22/2021 CLINICAL DATA:  Epigastric pain, gallstones EXAM: ULTRASOUND ABDOMEN LIMITED RIGHT UPPER QUADRANT COMPARISON:  CT abdomen 12/22/2021, 01/26/2017 FINDINGS: Gallbladder: Cholelithiasis. No gallbladder wall thickening or pericholecystic fluid. Small amount of gallbladder sludge. Negative sonographic Murphy sign. Common bile duct: Diameter: 6.6 mm Liver: No focal lesion identified. Increased hepatic parenchymal echogenicity. Portal vein is patent on color Doppler imaging with normal direction of blood flow towards the liver. Other: None. IMPRESSION: 1. Cholelithiasis without sonographic evidence of acute cholecystitis. 2. Increased hepatic echogenicity as can be seen with hepatic steatosis. Electronically Signed   By: Kathreen Devoid M.D.   On: 12/22/2021 12:29   ECHOCARDIOGRAM COMPLETE  Result Date: 12/23/2021    ECHOCARDIOGRAM REPORT   Patient  Name:   GIOMAR GUSLER Date of Exam: 12/23/2021 Medical Rec #:  [id number]       Height:       75.5 in Accession #:    [id number]      Weight:       254.7 lb Date of Birth:  Sep 23, 1951       BSA:          2.444 m Patient Age:    7 years        BP:           124/65 mmHg Patient Gender: M               HR:           57 bpm. Exam Location:  Inpatient Procedure: 2D Echo, Cardiac Doppler and Color Doppler Indications:    Afib  History:        Patient has prior history of Echocardiogram examinations, most                 recent 11/13/2016. Arrythmias:RBBB; Risk Factors:Hypertension and                 Diabetes.  Sonographer:    Glo Herring Referring Phys: [id number] [place]  1. Left ventricular ejection fraction, by estimation, is 60 to 65%. The left ventricle has normal function. The left ventricle has no regional wall motion abnormalities. Left ventricular diastolic function could not be evaluated.  2. Right ventricular systolic function was not well visualized. The right ventricular size is normal. Tricuspid regurgitation signal is inadequate for assessing PA pressure.  3. Left atrial size was mildly dilated.  4. The mitral valve is normal in structure. No evidence of mitral valve regurgitation. No evidence of mitral stenosis.  5. The aortic valve is normal in structure. Aortic valve regurgitation is not visualized. No aortic stenosis is present.  6. Aortic dilatation noted. Aneurysm of the aortic root, measuring 47 mm.  7. The inferior vena cava is dilated in size with <50% respiratory variability, suggesting right atrial pressure of 15 mmHg. FINDINGS  Left Ventricle: Left ventricular ejection fraction, by estimation, is 60 to 65%. The left ventricle has normal function. The left ventricle has no regional wall motion abnormalities. The left ventricular internal cavity size was normal in size. There is  no left ventricular hypertrophy. Left ventricular diastolic function could not be evaluated due to  atrial fibrillation. Left ventricular diastolic function could not be evaluated. Right Ventricle: The right ventricular size is normal. No increase in right ventricular wall thickness. Right ventricular systolic function was not well visualized. Tricuspid regurgitation signal is inadequate for assessing PA pressure. Left Atrium: Left atrial size was mildly dilated. Right Atrium: Right atrial size was normal in size. Pericardium: There is no evidence of pericardial effusion. Mitral Valve: The mitral valve is normal in structure. Mild mitral annular calcification. No evidence of mitral valve regurgitation. No evidence of mitral valve stenosis. Tricuspid Valve: The tricuspid valve is normal in structure. Tricuspid valve regurgitation is not demonstrated. No evidence of tricuspid stenosis. Aortic Valve: The aortic valve is normal in structure. Aortic valve regurgitation is not visualized. No aortic stenosis is present. Aortic valve mean gradient measures 3.0 mmHg. Aortic valve peak gradient measures 5.6 mmHg. Aortic valve area, by VTI measures 2.12 cm. Pulmonic Valve: The pulmonic valve was normal in structure. Pulmonic valve regurgitation is not visualized. No evidence of pulmonic stenosis. Aorta: Aortic dilatation noted. There is an aneurysm involving the aortic root measuring 47 mm. Venous: The inferior vena cava is dilated in size with less than 50% respiratory variability, suggesting right atrial pressure of 15 mmHg. IAS/Shunts: No atrial level shunt detected by color flow Doppler.  LEFT VENTRICLE PLAX 2D LVOT diam:     2.20 cm LV SV:         52 LV SV Index:   21 LVOT Area:     3.80 cm  IVC IVC diam: 3.60 cm LEFT ATRIUM             Index LA Vol (A2C):   76.0 ml 31.10 ml/m LA Vol (A4C):   86.2 ml 35.27 ml/m LA Biplane Vol: 87.6 ml 35.84 ml/m  AORTIC VALVE AV Area (Vmax):    1.99 cm AV Area (Vmean):   1.98 cm AV Area (VTI):     2.12 cm AV Vmax:           118.67 cm/s AV Vmean:          79.767 cm/s AV VTI:             0.244 m AV Peak Grad:      5.6 mmHg AV Mean Grad:      3.0 mmHg LVOT Vmax:         61.97 cm/s LVOT Vmean:        41.567 cm/s LVOT VTI:          0.136 m LVOT/AV VTI ratio: 0.56  AORTA Ao Root diam: 4.70 cm  SHUNTS Systemic VTI:  0.14 m Systemic Diam: 2.20 cm Fransico Him MD Electronically signed by Fransico Him MD Signature Date/Time: 12/23/2021/1:39:38 PM    Final     Microbiology: Recent Results (from the past 240 hour(s))  Resp Panel by RT-PCR (Flu A&B, Covid) Nasopharyngeal Swab     Status: None   Collection Time: 12/22/21  3:49 PM   Specimen: Nasopharyngeal Swab; Nasopharyngeal(NP) swabs in vial transport medium  Result Value Ref Range Status   SARS Coronavirus 2 by RT PCR NEGATIVE NEGATIVE Final    Comment: (NOTE) SARS-CoV-2 target nucleic acids are NOT DETECTED.  The SARS-CoV-2 RNA is generally detectable in upper respiratory specimens during the acute phase of infection. The lowest concentration of SARS-CoV-2 viral copies this assay can detect is 138 copies/mL. A negative result does not preclude SARS-Cov-2 infection and should not be used as the sole basis for treatment or other patient management  decisions. A negative result may occur with  improper specimen collection/handling, submission of specimen other than nasopharyngeal swab, presence of viral mutation(s) within the areas targeted by this assay, and inadequate number of viral copies(<138 copies/mL). A negative result must be combined with clinical observations, patient history, and epidemiological information. The expected result is Negative.  Fact Sheet for Patients:  EntrepreneurPulse.com.au  Fact Sheet for Healthcare Providers:  IncredibleEmployment.be  This test is no t yet approved or cleared by the [place] FDA and  has been authorized for detection and/or diagnosis of SARS-CoV-2 by FDA under an Emergency Use Authorization (EUA). This EUA will remain  in effect  (meaning this test can be used) for the duration of the COVID-19 declaration under Section 564(b)(1) of the Act, 21 U.S.C.section 360bbb-3(b)(1), unless the authorization is terminated  or revoked sooner.       Influenza A by PCR NEGATIVE NEGATIVE Final   Influenza B by PCR NEGATIVE NEGATIVE Final    Comment: (NOTE) The Xpert Xpress SARS-CoV-2/FLU/RSV plus assay is intended as an aid in the diagnosis of influenza from Nasopharyngeal swab specimens and should not be used as a sole basis for treatment. Nasal washings and aspirates are unacceptable for Xpert Xpress SARS-CoV-2/FLU/RSV testing.  Fact Sheet for Patients: EntrepreneurPulse.com.au  Fact Sheet for Healthcare Providers: IncredibleEmployment.be  This test is not yet approved or cleared by the [place] FDA and has been authorized for detection and/or diagnosis of SARS-CoV-2 by FDA under an Emergency Use Authorization (EUA). This EUA will remain in effect (meaning this test can be used) for the duration of the COVID-19 declaration under Section 564(b)(1) of the Act, 21 U.S.C. section 360bbb-3(b)(1), unless the authorization is terminated or revoked.  Performed at [facility], [place] [address] [place], [place] [address]   MRSA Next Gen by PCR, Nasal     Status: None   Collection Time: 12/22/21  6:21 PM   Specimen: Nasal Mucosa; Nasal Swab  Result Value Ref Range Status   MRSA by PCR Next Gen NOT DETECTED NOT DETECTED Final    Comment: (NOTE) The GeneXpert MRSA Assay (FDA approved for NASAL specimens only), is one component of a comprehensive MRSA colonization surveillance program. It is not intended to diagnose MRSA infection nor to guide or monitor treatment for MRSA infections. Test performance is not FDA approved in patients less than 45 years old. Performed at [facility], [place] [address], [place], [place] [address]      Labs: Basic  Metabolic Panel: Recent Labs  Lab 12/22/21 0918 12/22/21 1841 12/23/21 0333  NA 134*  --  133*  K 4.4  --  4.1  CL 103  --  102  CO2 23  --  25  GLUCOSE 161*  --  123*  BUN 22  --  17  CREATININE 1.09 0.90 1.04  CALCIUM 8.5*  --  8.4*   Liver Function Tests: Recent Labs  Lab 12/22/21 0918 12/23/21 0333  AST 16 14*  ALT 14 13  ALKPHOS 80 78  BILITOT 0.8 0.9  PROT 7.3 6.8  ALBUMIN 3.4* 3.1*   Recent Labs  Lab 12/22/21 0918  LIPASE 40   No results for input(s): AMMONIA in the last 168 hours. CBC: Recent Labs  Lab 12/22/21 0918 12/22/21 1841 12/23/21 0333  WBC 6.3 6.6 5.9  NEUTROABS 5.6  --   --   HGB 10.0* 11.4* 9.6*  HCT 30.0* 35.7* 29.6*  MCV 106.8* 111.2* 108.0*  PLT 156 158  156   Cardiac Enzymes: No results for input(s): CKTOTAL, CKMB, CKMBINDEX, TROPONINI in the last 168 hours. BNP: BNP (last 3 results) No results for input(s): BNP in the last 8760 hours.  ProBNP (last 3 results) No results for input(s): PROBNP in the last 8760 hours.  CBG: Recent Labs  Lab 12/24/21 1215 12/24/21 1635 12/24/21 2022 12/25/21 0707 12/25/21 1151  GLUCAP 124* 150* 129* 112* 148*    Principal Problem:   Symptomatic bradycardia Active Problems:   Atrial fibrillation with slow ventricular response ([place])   Time coordinating discharge: 38 minutes.  Signed:        Malea Swilling, DO [facility]  12/25/2021, 2:46 PM

## 2021-12-25 NOTE — TOC Progression Note (Signed)
Transition of Care [facility]) - Progression Note    Patient Details  Name: [name] MRN: [id number] Date of Birth: [birth year]  [facility]) CM/SW Contact  Purcell Mouton, RN Phone Number: 12/25/2021, 3:38 PM  Clinical Narrative:     Pt will discharge back to [facility] [place]. Discharge Summary and FL2 fax to [phone].  Expected Discharge Plan: Assisted Living Barriers to Discharge: No Barriers Identified  Expected Discharge Plan and Services Expected Discharge Plan: Assisted Living       Living arrangements for the past 2 months: [place] Expected Discharge Date: 12/25/21                                     Social Determinants of Health (SDOH) Interventions    Readmission Risk Interventions No flowsheet data found.

## 2021-12-25 NOTE — TOC Progression Note (Signed)
Transition of Care [facility]) - Progression Note    Patient Details  Name: [name] MRN: [id number] Date of Birth: 09-Jul-1951  Transition of Care [facility]) CM/SW Contact  Purcell Mouton, RN Phone Number: 12/25/2021, 4:28 PM  Clinical Narrative:     Damaris Schooner with [facility] to said that CM was given the wrong fax number from [facility] which caused the fax to fail. CM had called several time to check. Learned that fax number was in correct. FL2 and discharge summary re-fax . [facility], agreed with sending pt back. Taxi Voucher given to [employer].   Expected Discharge Plan: Assisted Living Barriers to Discharge: No Barriers Identified  Expected Discharge Plan and Services Expected Discharge Plan: Assisted Living       Living arrangements for the past 2 months: [place] Expected Discharge Date: 12/25/21                                     Social Determinants of Health (SDOH) Interventions    Readmission Risk Interventions No flowsheet data found.

## 2021-12-25 NOTE — Progress Notes (Signed)
Pt reports abd discomfort/pressure and no BM for 3 days. Abdomen soft and tender, active bowel sounds heard (lower abd, midline). Passing gas. Miralax and Colace as bowel regimen,verbalizes he does not prefer suppositories. NP made aware.

## 2021-12-25 NOTE — TOC Progression Note (Signed)
Transition of Care [facility]) - Progression Note    Patient Details  Name: [name] MRN: [id number] Date of Birth: 05/04/1951  Transition of Care [facility]) CM/SW Contact  Purcell Mouton, RN Phone Number: 12/25/2021, 2:51 PM  Clinical Narrative:      Transition of Care (TOC) Screening Note   Patient Details  Name: [name] Date of Birth: 11/24/1951   Transition of Care [facility]) CM/SW Contact:    Purcell Mouton, RN Phone Number: 12/25/2021, 2:51 PM    Transition of Care Department [facility]) has reviewed patient and no TOC needs have been identified at this time. We will continue to monitor patient advancement through interdisciplinary progression rounds. If new patient transition needs arise, please place a TOC consult.         Expected Discharge Plan and Services           Expected Discharge Date: 12/25/21                                     Social Determinants of Health (SDOH) Interventions    Readmission Risk Interventions No flowsheet data found.

## 2021-12-25 NOTE — NC FL2 (Addendum)
[place] LEVEL OF CARE SCREENING TOOL     IDENTIFICATION  Patient Name: [name] Birthdate: [birth year] Sex: male Admission Date (Current Location): 12/22/2021  [facility] and [place] Number:  Herbalist and Address:  [facility],  [place], [place]      Provider Number: [id number]  Attending Physician Name and Address:  Karie Kirks, DO  Relative Name and Phone Number:  Cassell Smiles [phone]    Current Level of Care: Hospital Recommended Level of Care: ALF Prior Approval Number:    Date Approved/Denied:   PASRR Number:    Discharge Plan: Other (Comment) (ALF)    Current Diagnoses: Patient Active Problem List   Diagnosis Date Noted   Atrial fibrillation with slow ventricular response ([place]) 12/23/2021   Symptomatic bradycardia 12/22/2021   Adenocarcinoma of colon ([place])    Rectal pain 12/04/2016   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   Polyp of cecum 11/17/2016   Abnormal CT scan, colon    Benign neoplasm of cecum    AKI (acute kidney injury) ([place])    Generalized weakness    Fecal impaction (HCC)    Abdominal pain    Impaction of colon ([place])    Protein-calorie malnutrition, severe 11/10/2016   Constipation 11/09/2016   Urinary tract infection without hematuria 11/09/2016   Hypokalemia 11/09/2016   CKD (chronic kidney disease) stage 3, GFR 30-59 ml/min ([place]) 11/09/2016   Hyperbilirubinemia 11/09/2016   Homelessness 11/09/2016   Hyponatremia 11/08/2016   RBBB 09/13/2013   Snoring 09/13/2013   Edema 09/07/2013   Diabetes mellitus type 2 in obese ([place]) 03/16/2013   Arthritis 03/16/2013   Essential hypertension, benign 02/23/2013   Morbid obesity ([place]) 02/23/2013   Atrial fibrillation ([place]) 02/23/2013   Other and unspecified hyperlipidemia 02/23/2013    Orientation RESPIRATION BLADDER Height & Weight     Self, Time, Situation, Place  Normal Continent Weight: 115.5  kg Height:  6' 3.5" (191.8 cm)  BEHAVIORAL SYMPTOMS/MOOD NEUROLOGICAL BOWEL NUTRITION STATUS      Continent Diet (Carb Mod)  AMBULATORY STATUS COMMUNICATION OF NEEDS Skin   Limited Assist Verbally Normal                       Personal Care Assistance Level of Assistance  Bathing, Feeding, Dressing Bathing Assistance: Limited assistance Feeding assistance: Independent Dressing Assistance: Limited assistance     Functional Limitations Info  Sight, Hearing, Speech Sight Info: Adequate Hearing Info: Adequate Speech Info: Adequate    SPECIAL CARE FACTORS FREQUENCY  PT (By licensed PT), OT (By licensed OT)     PT Frequency: Eval and Treat OT Frequency: Eval and Treat            Contractures Contractures Info: Present    Additional Factors Info  Code Status, Allergies, Insulin Sliding Scale Code Status Info: FULL Allergies Info: [place] bandage, self adhering tape-rash   Insulin Sliding Scale Info: See discharge summary       Current Medications (12/25/2021):  This is the current hospital active medication list Current Facility-Administered Medications  Medication Dose Route Frequency Provider Last Rate Last Admin   acetaminophen (TYLENOL) tablet 650 mg  650 mg Oral Q6H PRN Marylyn Ishihara, Tyrone A, DO   650 mg at 12/25/21 0150   Or   acetaminophen (TYLENOL) suppository 650 mg  650 mg Rectal Q6H PRN Marylyn Ishihara, Tyrone A, DO       apixaban (ELIQUIS) tablet 5 mg  5 mg Oral BID Pixie Casino, MD   5 mg at 12/25/21 1022   atorvastatin (LIPITOR) tablet 20 mg  20 mg Oral Daily Kyle, Tyrone A, DO   20 mg at 12/25/21 1022   carbamide peroxide (DEBROX) 6.5 % OTIC (EAR) solution 5 drop  5 drop Right EAR BID Swayze, Ava, DO   5 drop at 12/25/21 1022   docusate sodium (COLACE) capsule 100 mg  100 mg Oral BID Marylyn Ishihara, Tyrone A, DO   100 mg at 81/15/72 6203   folic acid (FOLVITE) tablet 1 mg  1 mg Oral Once per day on Sun Mon Tue Wed Thu Sat Cherylann Ratel A, DO   1 mg at 12/24/21 5597    hydrALAZINE (APRESOLINE) injection 10 mg  10 mg Intravenous Q8H PRN Cherylann Ratel A, DO       hydrOXYzine (ATARAX) tablet 25 mg  25 mg Oral Q8H PRN Marylyn Ishihara, Tyrone A, DO   25 mg at 12/22/21 2143   insulin aspart (novoLOG) injection 0-15 Units  0-15 Units Subcutaneous TID WC Kyle, Tyrone A, DO   2 Units at 12/25/21 1204   insulin aspart (novoLOG) injection 0-5 Units  0-5 Units Subcutaneous QHS Marylyn Ishihara, Tyrone A, DO       nystatin (MYCOSTATIN/NYSTOP) topical powder 1 application  1 application Topical BID Marylyn Ishihara, Tyrone A, DO   1 application at 41/63/84 1022   ondansetron (ZOFRAN) tablet 4 mg  4 mg Oral Q6H PRN Marylyn Ishihara, Tyrone A, DO       Or   ondansetron (ZOFRAN) injection 4 mg  4 mg Intravenous Q6H PRN Marylyn Ishihara, Tyrone A, DO       pantoprazole (PROTONIX) EC tablet 40 mg  40 mg Oral Daily Kyle, Tyrone A, DO   40 mg at 12/25/21 1022   polyethylene glycol (MIRALAX / GLYCOLAX) packet 17 g  17 g Oral Daily Kyle, Tyrone A, DO   17 g at 12/24/21 0850   tamsulosin (FLOMAX) capsule 0.4 mg  0.4 mg Oral Daily Kyle, Tyrone A, DO   0.4 mg at 12/25/21 1022     Discharge Medications: Please see discharge summary for a list of discharge medications.  Relevant Imaging Results:  Relevant Lab Results:   Additional Information [phone]  Purcell Mouton, RN

## 2021-12-25 NOTE — Progress Notes (Signed)
°   12/25/21 1400  Mobility  Activity Ambulated in hall  Level of Assistance Modified independent, requires aide device or extra time  Assistive Device None  Distance Ambulated (ft) 500 ft  Mobility Ambulated with assistance in hallway  Mobility Response Tolerated well  Mobility performed by Mobility specialist  $Mobility charge 1 Mobility   Pt agreeable to mobilize this afternoon. Ambulated about 546ft in hall with no AD, tolerated well. Took 2 standing rest breaks. Left pt in chair with call bell at side. Pt stated he would like to stay one more night.   Mobility Specialist - Progress Note Pre-mobility: HR 93 During mobility: HR 103-111 Post-mobility: HR 95  [place] Office: [phone]

## 2021-12-25 NOTE — Care Management Important Message (Signed)
Important Message  Patient Details IM Letter given to the Patient. Name: SAMARI GORBY MRN: [id number] Date of Birth: 12-17-1950   Medicare Important Message Given:  Yes     Kerin Salen 12/25/2021, 10:43 AM

## 2021-12-31 ENCOUNTER — Encounter (HOSPITAL_BASED_OUTPATIENT_CLINIC_OR_DEPARTMENT_OTHER): Payer: Medicare Other | Admitting: Internal Medicine

## 2021-12-31 ENCOUNTER — Other Ambulatory Visit: Payer: Self-pay

## 2021-12-31 DIAGNOSIS — I87333 Chronic venous hypertension (idiopathic) with ulcer and inflammation of bilateral lower extremity: Secondary | ICD-10-CM | POA: Diagnosis not present

## 2021-12-31 DIAGNOSIS — E1140 Type 2 diabetes mellitus with diabetic neuropathy, unspecified: Secondary | ICD-10-CM | POA: Diagnosis not present

## 2021-12-31 DIAGNOSIS — I10 Essential (primary) hypertension: Secondary | ICD-10-CM | POA: Diagnosis not present

## 2021-12-31 DIAGNOSIS — E11621 Type 2 diabetes mellitus with foot ulcer: Secondary | ICD-10-CM | POA: Diagnosis present

## 2021-12-31 DIAGNOSIS — I872 Venous insufficiency (chronic) (peripheral): Secondary | ICD-10-CM | POA: Diagnosis not present

## 2021-12-31 DIAGNOSIS — R600 Localized edema: Secondary | ICD-10-CM | POA: Diagnosis not present

## 2021-12-31 DIAGNOSIS — L97528 Non-pressure chronic ulcer of other part of left foot with other specified severity: Secondary | ICD-10-CM | POA: Diagnosis not present

## 2021-12-31 NOTE — Progress Notes (Signed)
[name] ([id number]) Visit Report for 12/31/2021 Arrival Information Details Patient Name: Date of Service: [name] 12/31/2021 8:45 A M Medical Record Number: [id number] Patient Account Number: [id number] Date of Birth/Sex: Treating RN: 1951/05/11 (71 y.o. [name] Primary Care [name]: PCP, NO Other Clinician: Referring [name]: Treating [name]/Extender: [name] in Treatment: 2 Visit Information History Since Last Visit Added or deleted any medications: Yes Patient Arrived: Ambulatory Had a fall or experienced change in No Arrival Time: 09:23 activities of daily living that may affect Accompanied By: self risk of falls: Transfer Assistance: None Signs or symptoms of abuse/neglect since last visito No Patient Identification Verified: Yes Hospitalized since last visit: Yes Secondary Verification Process Completed: Yes Implantable device outside of the clinic excluding No Patient Requires Transmission-Based Precautions: No cellular tissue based products placed in the center Patient Has Alerts: Yes since last visit: Patient Alerts: R and L ABIs N/C Has Dressing in Place as Prescribed: Yes Has Compression in Place as Prescribed: Yes Pain Present Now: No Electronic Signature(s) Signed: 12/31/2021 5:36:48 PM By: Baruch Gouty RN, BSN Entered By: Baruch Gouty on 12/31/2021 93:81:82 -------------------------------------------------------------------------------- Clinic Level of Care Assessment Details Patient Name: Date of Service: [name], Allen [name] 12/31/2021 8:45 A M Medical Record Number: [id number] Patient Account Number: [id number] Date of Birth/Sex: Treating RN: November 24, 1951 (71 y.o. [name] Primary Care [name]: PCP, NO Other Clinician: Referring [name]: Treating [name]/Extender: [name] in Treatment: 2 Clinic Level of Care Assessment Items TOOL 4 Quantity Score []  -  0 Use when only an EandM is performed on FOLLOW-UP visit ASSESSMENTS - Nursing Assessment / Reassessment X- 1 10 Reassessment of Co-morbidities (includes updates in patient status) X- 1 5 Reassessment of Adherence to Treatment Plan ASSESSMENTS - Wound and Skin A ssessment / Reassessment []  - 0 Simple Wound Assessment / Reassessment - one wound X- 2 5 Complex Wound Assessment / Reassessment - multiple wounds []  - 0 Dermatologic / Skin Assessment (not related to wound area) ASSESSMENTS - Focused Assessment X- 1 5 Circumferential Edema Measurements - multi extremities []  - 0 Nutritional Assessment / Counseling / Intervention X- 1 5 Lower Extremity Assessment (monofilament, tuning fork, pulses) []  - 0 Peripheral Arterial Disease Assessment (using hand held doppler) ASSESSMENTS - Ostomy and/or Continence Assessment and Care []  - 0 Incontinence Assessment and Management []  - 0 Ostomy Care Assessment and Management (repouching, etc.) PROCESS - Coordination of Care X - Simple Patient / Family Education for ongoing care 1 15 []  - 0 Complex (extensive) Patient / Family Education for ongoing care X- 1 10 Staff obtains Programmer, systems, Records, T Results / Process Orders est X- 1 10 Staff telephones HHA, Nursing Homes / Clarify orders / etc []  - 0 Routine Transfer to another Facility (non-emergent condition) []  - 0 Routine Hospital Admission (non-emergent condition) []  - 0 New Admissions / Biomedical engineer / Ordering NPWT Apligraf, etc. , []  - 0 Emergency Hospital Admission (emergent condition) X- 1 10 Simple Discharge Coordination []  - 0 Complex (extensive) Discharge Coordination PROCESS - Special Needs []  - 0 Pediatric / Minor Patient Management []  - 0 Isolation Patient Management []  - 0 Hearing / Language / Visual special needs []  - 0 Assessment of Community assistance (transportation, D/C planning, etc.) []  - 0 Additional assistance / Altered mentation []  -  0 Support Surface(s) Assessment (bed, cushion, seat, etc.) INTERVENTIONS - Wound Cleansing / Measurement []  - 0 Simple Wound Cleansing -  one wound X- 2 5 Complex Wound Cleansing - multiple wounds X- 1 5 Wound Imaging (photographs - any number of wounds) []  - 0 Wound Tracing (instead of photographs) []  - 0 Simple Wound Measurement - one wound X- 2 5 Complex Wound Measurement - multiple wounds INTERVENTIONS - Wound Dressings X - Small Wound Dressing one or multiple wounds 2 10 []  - 0 Medium Wound Dressing one or multiple wounds []  - 0 Large Wound Dressing one or multiple wounds []  - 0 Application of Medications - topical []  - 0 Application of Medications - injection INTERVENTIONS - Miscellaneous []  - 0 External ear exam []  - 0 Specimen Collection (cultures, biopsies, blood, body fluids, etc.) []  - 0 Specimen(s) / Culture(s) sent or taken to Lab for analysis []  - 0 Patient Transfer (multiple staff / Civil Service fast streamer / Similar devices) []  - 0 Simple Staple / Suture removal (25 or less) []  - 0 Complex Staple / Suture removal (26 or more) []  - 0 Hypo / Hyperglycemic Management (close monitor of Blood Glucose) []  - 0 Ankle / Brachial Index (ABI) - do not check if billed separately X- 1 5 Vital Signs Has the patient been seen at the hospital within the last three years: Yes Total Score: 130 Level Of Care: New/Established - Level 4 Electronic Signature(s) Signed: 12/31/2021 5:36:48 PM By: Baruch Gouty RN, BSN Entered By: Baruch Gouty on 12/31/2021 10:10:49 -------------------------------------------------------------------------------- Encounter Discharge Information Details Patient Name: Date of Service: [name], Allen [name] 12/31/2021 8:45 A M Medical Record Number: [id number] Patient Account Number: [id number] Date of Birth/Sex: Treating RN: 10/11/1951 (71 y.o. [name] Primary Care [name]: PCP, NO Other Clinician: Referring [name]: Treating  [name]/Extender: [name] in Treatment: 2 Encounter Discharge Information Items Discharge Condition: Stable Ambulatory Status: Ambulatory Discharge Destination: Home Transportation: Other Accompanied By: self Schedule Follow-up Appointment: Yes Clinical Summary of Care: Patient Declined Notes facility transportation Electronic Signature(s) Signed: 12/31/2021 5:36:48 PM By: Baruch Gouty RN, BSN Entered By: Baruch Gouty on 12/31/2021 11:05:21 -------------------------------------------------------------------------------- Lower Extremity Assessment Details Patient Name: Date of Service: [name], Allen [name] 12/31/2021 8:45 A M Medical Record Number: [id number] Patient Account Number: [id number] Date of Birth/Sex: Treating RN: 06/15/1951 (71 y.o. [name] Primary Care Mitsuo Budnick: PCP, NO Other Clinician: Referring Carisa Backhaus: Treating Kierre Hintz/Extender: Marylin Crosby Weeks in Treatment: 2 Edema Assessment Assessed: [Left: No] [Right: No] Edema: [Left: Ye] [Right: s] Calf Left: Right: Point of Measurement: From Medial Instep 40 cm Ankle Left: Right: Point of Measurement: From Medial Instep 27.5 cm Vascular Assessment Pulses: Dorsalis Pedis Palpable: [Left:Yes] Electronic Signature(s) Signed: 12/31/2021 5:36:48 PM By: Baruch Gouty RN, BSN Entered By: Baruch Gouty on 12/31/2021 09:36:52 -------------------------------------------------------------------------------- Multi Wound Chart Details Patient Name: Date of Service: [name], Allen [name] 12/31/2021 8:45 A M Medical Record Number: [id number] Patient Account Number: [id number] Date of Birth/Sex: Treating RN: June 06, 1951 (71 y.o. [name] Primary Care Tomislav Micale: PCP, NO Other Clinician: Referring Ernan Runkles: Treating Shemicka Cohrs/Extender: Marylin Crosby Weeks in Treatment: 2 Vital Signs Height(in): 75 Capillary Blood Glucose(mg/dl):  134 Weight(lbs): 245 Pulse(bpm): 60 Body Mass Index(BMI): 31 Blood Pressure(mmHg): 137/72 Temperature(F): 98.6 Respiratory Rate(breaths/min): 18 Photos: [N/A:N/A] Left T - Web between 2nd and 3rd Left, Dorsal Foot oe N/A Wound Location: Gradually Appeared Gradually Appeared N/A Wounding Event: Diabetic Wound/Ulcer of the Lower Diabetic Wound/Ulcer of the Lower N/A Primary Etiology: Extremity Extremity N/A Lymphedema N/A Secondary Etiology: Cataracts, Lymphedema, Arrhythmia, Cataracts, Lymphedema, Arrhythmia, N/A  Comorbid History: Peripheral Venous Disease, Type II Peripheral Venous Disease, Type II Diabetes, Osteoarthritis, Neuropathy Diabetes, Osteoarthritis, Neuropathy 12/14/2021 12/28/2021 N/A Date Acquired: 2 0 N/A Weeks of Treatment: Open Open N/A Wound Status: No Yes N/A Clustered Wound: N/A 2 N/A Clustered Quantity: 0.5x0.2x0.1 1.6x2.5x0.1 N/A Measurements L x W x D (cm) 0.079 3.142 N/A A (cm) : rea 0.008 0.314 N/A Volume (cm) : 93.70% 0.00% N/A % Reduction in A rea: 93.70% 0.00% N/A % Reduction in Volume: Grade 1 Grade 1 N/A Classification: Small Medium N/A Exudate A mount: Serous Serosanguineous N/A Exudate Type: amber red, brown N/A Exudate Color: Flat and Intact Flat and Intact N/A Wound Margin: Large (67-100%) Large (67-100%) N/A Granulation A mount: Pink Red N/A Granulation Quality: None Present (0%) None Present (0%) N/A Necrotic A mount: Fat Layer (Subcutaneous Tissue): Yes Fat Layer (Subcutaneous Tissue): Yes N/A Exposed Structures: Fascia: No Fascia: No Tendon: No Tendon: No Muscle: No Muscle: No Joint: No Joint: No Bone: No Bone: No Medium (34-66%) Small (1-33%) N/A Epithelialization: Treatment Notes Electronic Signature(s) Signed: 12/31/2021 4:44:32 PM By: Linton Ham MD Signed: 12/31/2021 5:47:38 PM By: Deon Pilling RN, BSN Entered By: Linton Ham on 12/31/2021  10:28:54 -------------------------------------------------------------------------------- Multi-Disciplinary Care Plan Details Patient Name: Date of Service: [name], Allen [name] 12/31/2021 8:45 A M Medical Record Number: [id number] Patient Account Number: [id number] Date of Birth/Sex: Treating RN: [birth year] (71 y.o. [name] Primary Care Jacquis Paxton: PCP, NO Other Clinician: Referring Ashantia Amaral: Treating Nyja Westbrook/Extender: [name] in Treatment: 2 Multidisciplinary Care Plan reviewed with physician Active Inactive Nutrition Nursing Diagnoses: Impaired glucose control: actual or potential Potential for alteratiion in Nutrition/Potential for imbalanced nutrition Goals: Patient/caregiver will maintain therapeutic glucose control Date Initiated: 12/17/2021 Target Resolution Date: 01/14/2022 Goal Status: Active Interventions: Assess HgA1c results as ordered upon admission and as needed Assess patient nutrition upon admission and as needed per policy Provide education on elevated blood sugars and impact on wound healing Provide education on nutrition Treatment Activities: Patient referred to Primary Care Physician for further nutritional evaluation : 12/17/2021 Notes: Wound/Skin Impairment Nursing Diagnoses: Impaired tissue integrity Knowledge deficit related to ulceration/compromised skin integrity Goals: Patient/caregiver will verbalize understanding of skin care regimen Date Initiated: 12/17/2021 Target Resolution Date: 01/15/2022 Goal Status: Active Ulcer/skin breakdown will have a volume reduction of 30% by week 4 Date Initiated: 12/17/2021 Target Resolution Date: 01/14/2022 Goal Status: Active Interventions: Assess patient/caregiver ability to obtain necessary supplies Assess patient/caregiver ability to perform ulcer/skin care regimen upon admission and as needed Assess ulceration(s) every visit Provide education on ulcer and skin care Treatment  Activities: Skin care regimen initiated : 12/17/2021 Topical wound management initiated : 12/17/2021 Notes: Electronic Signature(s) Signed: 12/31/2021 5:36:48 PM By: Baruch Gouty RN, BSN Entered By: Baruch Gouty on 12/31/2021 09:41:39 -------------------------------------------------------------------------------- Pain Assessment Details Patient Name: Date of Service: [name], Allen [name] 12/31/2021 8:45 A M Medical Record Number: [id number] Patient Account Number: [id number] Date of Birth/Sex: Treating RN: 08/15/1951 (71 y.o. [name] Primary Care Lakyn Alsteen: PCP, NO Other Clinician: Referring Shamel Galyean: Treating Samarion Ehle/Extender: [name] in Treatment: 2 Active Problems Location of Pain Severity and Description of Pain Patient Has Paino No Site Locations Rate the pain. Current Pain Level: 0 Pain Management and Medication Current Pain Management: Electronic Signature(s) Signed: 12/31/2021 5:36:48 PM By: Baruch Gouty RN, BSN Entered By: Baruch Gouty on 12/31/2021 09:28:11 -------------------------------------------------------------------------------- Patient/Caregiver Education Details Patient Name: Date of Service: [name], Allen [name] 1/19/2023andnbsp8:45 A M  Medical Record Number: [id number] Patient Account Number: [id number] Date of Birth/Gender: Treating RN: [birth year] (71 y.o. [name] Primary Care Physician: PCP, NO Other Clinician: Referring Physician: Treating Physician/Extender: [name] in Treatment: 2 Education Assessment Education Provided To: Patient Education Topics Provided Venous: Methods: Explain/Verbal Responses: Reinforcements needed, State content correctly Wound/Skin Impairment: Methods: Explain/Verbal Responses: Reinforcements needed, State content correctly Electronic Signature(s) Signed: 12/31/2021 5:36:48 PM By: Baruch Gouty RN, BSN Entered By: Baruch Gouty on  12/31/2021 09:42:13 -------------------------------------------------------------------------------- Wound Assessment Details Patient Name: Date of Service: [name], Allen [name] 12/31/2021 8:45 A M Medical Record Number: [id number] Patient Account Number: [id number] Date of Birth/Sex: Treating RN: 03/05/1951 (71 y.o. [name], [name] Primary Care Thos Matsumoto: PCP, NO Other Clinician: Referring Natilie Krabbenhoft: Treating Aletheia Tangredi/Extender: Marylin Crosby Weeks in Treatment: 2 Wound Status Wound Number: 8 Primary Diabetic Wound/Ulcer of the Lower Extremity Etiology: Wound Location: Left T - Web between 2nd and 3rd oe Wound Open Wounding Event: Gradually Appeared Status: Date Acquired: 12/14/2021 Comorbid Cataracts, Lymphedema, Arrhythmia, Peripheral Venous Disease, Weeks Of Treatment: 2 History: Type II Diabetes, Osteoarthritis, Neuropathy Clustered Wound: No Photos Wound Measurements Length: (cm) 0.5 Width: (cm) 0.2 Depth: (cm) 0.1 Area: (cm) 0.079 Volume: (cm) 0.008 % Reduction in Area: 93.7% % Reduction in Volume: 93.7% Epithelialization: Medium (34-66%) Tunneling: No Undermining: No Wound Description Classification: Grade 1 Wound Margin: Flat and Intact Exudate Amount: Small Exudate Type: Serous Exudate Color: amber Wound Bed Granulation Amount: Large (67-100%) Granulation Quality: Pink Necrotic Amount: None Present (0%) Foul Odor After Cleansing: No Slough/Fibrino No Exposed Structure Fascia Exposed: No Fat Layer (Subcutaneous Tissue) Exposed: Yes Tendon Exposed: No Muscle Exposed: No Joint Exposed: No Bone Exposed: No Treatment Notes Wound #8 (Toe - Web between 2nd and 3rd) Wound Laterality: Left Cleanser Peri-Wound Care prescription cetaphil/triamcinolone cream Discharge Instruction: apply to feet and legs nightly Topical Primary Dressing KerraCel Ag Gelling Fiber Dressing, 2x2 in (silver alginate) Discharge Instruction: Apply silver  alginate to wound bed as instructed Secondary Dressing Woven Gauze Sponge, Non-Sterile 4x4 in Discharge Instruction: Apply over primary dressing as directed. Secured With Conforming Stretch Gauze Bandage, Sterile 2x75 (in/in) Discharge Instruction: Secure with stretch gauze as directed. Compression Wrap Compression Stockings Add-Ons Electronic Signature(s) Signed: 12/31/2021 5:36:48 PM By: Baruch Gouty RN, BSN Signed: 12/31/2021 5:47:51 PM By: [name] RN Entered By: Baruch Gouty on 12/31/2021 09:39:35 -------------------------------------------------------------------------------- Wound Assessment Details Patient Name: Date of Service: [name], Allen [name] 12/31/2021 8:45 A M Medical Record Number: [id number] Patient Account Number: [id number] Date of Birth/Sex: Treating RN: Dec 02, 1951 (71 y.o. [name] Primary Care Franciscojavier Wronski: PCP, NO Other Clinician: Referring Anitria Andon: Treating Brodrick Curran/Extender: Marylin Crosby Weeks in Treatment: 2 Wound Status Wound Number: 9 Primary Diabetic Wound/Ulcer of the Lower Extremity Etiology: Wound Location: Left, Dorsal Foot Secondary Lymphedema Wounding Event: Gradually Appeared Etiology: Date Acquired: 12/28/2021 Wound Open Weeks Of Treatment: 0 Status: Clustered Wound: Yes Comorbid Cataracts, Lymphedema, Arrhythmia, Peripheral Venous Disease, History: Type II Diabetes, Osteoarthritis, Neuropathy Photos Wound Measurements Length: (cm) 1.6 Width: (cm) 2.5 Depth: (cm) 0.1 Clustered Quantity: 2 Area: (cm) 3.142 Volume: (cm) 0.314 % Reduction in Area: 0% % Reduction in Volume: 0% Epithelialization: Small (1-33%) Tunneling: No Undermining: No Wound Description Classification: Grade 1 Wound Margin: Flat and Intact Exudate Amount: Medium Exudate Type: Serosanguineous Exudate Color: red, brown Foul Odor After Cleansing: No Slough/Fibrino No Wound Bed Granulation Amount: Large (67-100%) Exposed  Structure Granulation Quality: Red Fascia Exposed: No Necrotic Amount: None  Present (0%) Fat Layer (Subcutaneous Tissue) Exposed: Yes Tendon Exposed: No Muscle Exposed: No Joint Exposed: No Bone Exposed: No Treatment Notes Wound #9 (Foot) Wound Laterality: Dorsal, Left Cleanser Peri-Wound Care prescription cetaphil/triamcinolone cream Discharge Instruction: apply to feet and legs nightly Topical Primary Dressing KerraCel Ag Gelling Fiber Dressing, 2x2 in (silver alginate) Discharge Instruction: Apply silver alginate to wound bed as instructed Secondary Dressing Woven Gauze Sponge, Non-Sterile 4x4 in Discharge Instruction: Apply over primary dressing as directed. Secured With Conforming Stretch Gauze Bandage, Sterile 2x75 (in/in) Discharge Instruction: Secure with stretch gauze as directed. Compression Wrap Compression Stockings Add-Ons Electronic Signature(s) Signed: 12/31/2021 5:36:48 PM By: Baruch Gouty RN, BSN Entered By: Baruch Gouty on 12/31/2021 09:41:07 -------------------------------------------------------------------------------- Vitals Details Patient Name: Date of Service: [name], Allen [name] 12/31/2021 8:45 A M Medical Record Number: [id number] Patient Account Number: [id number] Date of Birth/Sex: Treating RN: 06/23/1951 (71 y.o. [name] Primary Care Isahia Hollerbach: PCP, NO Other Clinician: Referring Martell Mcfadyen: Treating Lurlean Kernen/Extender: [name] in Treatment: 2 Vital Signs Time Taken: 09:26 Temperature (F): 98.6 Height (in): 75 Pulse (bpm): 62 Source: Stated Respiratory Rate (breaths/min): 18 Weight (lbs): 245 Blood Pressure (mmHg): 137/72 Source: Stated Capillary Blood Glucose (mg/dl): 134 Body Mass Index (BMI): 30.6 Reference Range: 80 - 120 mg / dl Notes glucose per pt report this am Electronic Signature(s) Signed: 12/31/2021 5:36:48 PM By: Baruch Gouty RN, BSN Entered By: Baruch Gouty on 12/31/2021  09:27:56

## 2021-12-31 NOTE — Progress Notes (Signed)
Reese, Allen ([id number]) Visit Report for 12/31/2021 HPI Details Patient Name: Date of Service: [name] Allen L. 12/31/2021 8:45 A M Medical Record Number: [id number] Patient Account Number: [id number] Date of Birth/Sex: Treating RN: 1951/06/01 (71 y.o. [name] Primary Care Provider: PCP, NO Other Clinician: Referring Provider: Treating Provider/Extender: Allena Katz in Treatment: 2 History of Present Illness HPI Description: [name] is a 71 year old male with a past medical history of type 2 diabetes and essential hypertension who presents to the clinic today for bilateral lower extremity wounds. He was recently seen in the ED on 3/9 for bilateral lower extremity edema. He had a DVT study that was negative for clots. He was given triamcinolone cream and advised to elevate legs and obtain compression stockings. He has been using the triamcinolone cream however he is unable to use compression stockings due to difficulty of putting on. Patient states that for the past 2 years he has had opening and closing of weeping wounds to his legs bilaterally. They spontaneously open and close. They are often open for several weeks before they heal spontaneously. He denies any purulent drainage, increased warmth or increased erythema to the skin. 4/21; patient presents for 1 week follow-up. The wounds on the right leg have healed however he has now 2 new wounds 1 on the anterior and posterior thigh that occurred from him scratching underneath the wrap. The left leg wound has improved and almost closed 4/28; patient presents for 1 week follow-up. He continues to have a wound on the right anterior leg that is showing signs of healing. The posterior right thigh has closed. He now has a new wound to the left leg that is limited to skin breakdown. 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 The wounds  are occurring because he goes underneath the leg wraps and scratches creating new wounds. He has no complaints today. 5/5; patient presents for 1 week follow-up. He no longer has any open wounds to his lower extremities bilaterally. He continues to have scattered excoriation marks from where he scratches. These are scabbed over. He states he is going to see dermatology this month. Overall he is doing well and is happy with his care. Readmission 6/10 Patient was followed for open wounds limited to skin breakdown to his lower extremities bilaterally 2/2 venous insufficiency. Once the wounds healed he was discharged with juxta lite compressions and these were placed in office. He was discharged on 5/5. He states that he has not taken the wraps off since discharge and has had them on for over a month. He did not want to take them off because they were very comfortable. He does not have any difficulty putting these on and off if he wanted to. He states that he does not take a shower and just cleans the other parts of his body at the sink. He noticed that 2 days ago he was having some drainage at the top of the wrap and called in to be evaluated. He states that he has not seen the wounds until the wraps were taken off today in office. He denies pain or increased warmth or erythema to the area 6/27; patient presents for 2-week follow-up. He has had increased swelling and drainage to the wounds over the last week. He reports increased erythema to the area. He does not have pain however. He denies purulent drainage or fever/chills. 7/5; patient  presents for 1 week follow-up. He was prescribed Keflex at last clinic visit and is currently taking this. He reports significant improvement to his leg swelling, tenderness and erythema. He denies signs of infection. 7/21; patient presents for 2-week follow-up. He reports improvement in healing in his right lower extremity. Unfortunately this morning he noticed  skin breakdown to his left lower extremity with increased redness and warmth. 7/28; patient presents for 1 week follow-up. He reports completing his antibiotics. He states that his wounds are healed. He has been using his juxta light compressions. He has no issues or complaints today. Readmission 12/17/2021; This is a 71 year old man who has been seen twice by Dr. Heber [place] for wounds on his bilateral lower extremities secondary to chronic venous insufficiency. Most recently this was from 05/22/2021 through 07/09/2021 with bilateral lower extremity wounds largely secondary to chronic venous insufficiency with stasis dermatitis. He has been discharged with juxta lite stockings. The patient tells me that about a month ago he developed too much edema to get his juxta lites on. He developed increased swelling. He saw his primary physician who gave him Lasix and the swelling seems to have come down. 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 He says this dates back into his adolescence and he could not walk barefoot etc. As a result of this he sometimes leaves his shoes and socks on for weeks and he is done that recently.[name] He arrives in clinic with not any wounds on his legs however with close inspection he had a fairly deep wound in the left second and third toe webspace His skin on his feet was not in good shape macerated flaking malodorous The patient's past medical history has not changed. I note that he had arterial studies done on 06/03/2021 that showed an ABI on the right of 1.26 with triphasic waveforms TBI of 0.63 on the right on the left ABI of 0.85 TBI of 0.63 again with triphasic waveforms 1/19; since the patient was last here he was seen in the hospital for bradycardia. He had his medications adjusted digoxin metoprolol. He tells me they dressed the wound between the left second and third toe. The patient says that he had open wounds on his dorsal  foot when we took off the dressing today. I was not exactly sure what he had on their. He had 2 wounds on the left dorsal foot with some significant surrounding erythema.The wound between the left second and third toe was just about closed however when we opened it to examine it today it it opened and a small area. The skin on his lower legs especially the left looks a lot better using triamcinolone and Cetaphil which she is applying He was on cephalexin prior described I think by primary care before he came here he completed this about 3 or 4 days ago Electronic Signature(s) Signed: 12/31/2021 4:44:32 PM By: Linton Ham MD Entered By: Linton Ham on 12/31/2021 10:36:10 -------------------------------------------------------------------------------- Physical Exam Details Patient Name: Date of Service: [name], [name] 12/31/2021 8:45 A M Medical Record Number: [id number] Patient Account Number: [id number] Date of Birth/Sex: Treating RN: 11-13-1951 (71 y.o. [name] Primary Care Provider: PCP, NO Other Clinician: Referring Provider: Treating Provider/Extender: Marylin Crosby Weeks in Treatment: 2 Constitutional Sitting or standing Blood Pressure is within target range for patient.. Pulse regular and within target range for patient.[name] Respirations regular, non-labored and within target range.. Temperature is normal  and within the target range for the patient.[name] Appears in no distress. Cardiovascular Pedal pulses are palpable. Notes Wound exam; he does not have any open areas on his legs and his skin looks better on the left. However he had 2 new open areas on the left dorsal foot with extensive erythema it over a small area. He has a small slip between the left second and third toes as described. Does not have a lot of pain in the dorsal foot however he may have neuropathy. There is no arterial issues here. Electronic Signature(s) Signed: 12/31/2021 4:44:32 PM By:  Linton Ham MD Entered By: Linton Ham on 12/31/2021 10:34:14 -------------------------------------------------------------------------------- Physician Orders Details Patient Name: Date of Service: [name], [name] 12/31/2021 8:45 A M Medical Record Number: [id number] Patient Account Number: [id number] Date of Birth/Sex: Treating RN: May 13, 1951 (71 y.o. [name] Primary Care Provider: PCP, NO Other Clinician: Referring Provider: Treating Provider/Extender: Allena Katz in Treatment: 2 Verbal / Phone Orders: No Diagnosis Coding ICD-10 Coding Code Description E11.621 Type 2 diabetes mellitus with foot ulcer L97.528 Non-pressure chronic ulcer of other part of left foot with other specified severity I87.323 Chronic venous hypertension (idiopathic) with inflammation of bilateral lower extremity Follow-up Appointments ppointment in 1 week. - Dr. Dellia Nims Return A Bathing/ Shower/ Hygiene May shower and wash wound with soap and water. - every other day Edema Control - Lymphedema / SCD / Other Bilateral Lower Extremities Exercise regularly Moisturize legs daily. - cetaphi/triamcinolone cream to legs and feet nightly before bed Compression stocking or Garment 20-30 mm/Hg pressure to: - juxtalite compression garments both legs daily Home Health Dressing changes to be completed by [place] on Tuesday / Thursday / Saturday except when patient has scheduled visit at [facility]. Other Home Health Orders/Instructions: - [facility] Wound Treatment Wound #8 - T - Web between 2nd and 3rd oe Wound Laterality: Left Peri-Wound Care: prescription cetaphil/triamcinolone cream Every Other Day/30 Days Discharge Instructions: apply to feet and legs nightly Prim Dressing: KerraCel Ag Gelling Fiber Dressing, 2x2 in (silver alginate) Every Other Day/30 Days ary Discharge Instructions: Apply silver alginate to wound bed as instructed Secondary Dressing: Woven  Gauze Sponge, Non-Sterile 4x4 in Every Other Day/30 Days Discharge Instructions: Apply over primary dressing as directed. Secured With: Child psychotherapist, Sterile 2x75 (in/in) Every Other Day/30 Days Discharge Instructions: Secure with stretch gauze as directed. Wound #9 - Foot Wound Laterality: Dorsal, Left Peri-Wound Care: prescription cetaphil/triamcinolone cream Every Other Day/30 Days Discharge Instructions: apply to feet and legs nightly Prim Dressing: KerraCel Ag Gelling Fiber Dressing, 2x2 in (silver alginate) Every Other Day/30 Days ary Discharge Instructions: Apply silver alginate to wound bed as instructed Secondary Dressing: Woven Gauze Sponge, Non-Sterile 4x4 in Every Other Day/30 Days Discharge Instructions: Apply over primary dressing as directed. Secured With: Child psychotherapist, Sterile 2x75 (in/in) Every Other Day/30 Days Discharge Instructions: Secure with stretch gauze as directed. Patient Medications llergies: No Known Allergies A Notifications Medication Indication Start End 12/31/2021 doxycycline hyclate DOSE 1 - oral 100 mg capsule - 1 capsule oral 2 times per day for 7 days Electronic Signature(s) Signed: 12/31/2021 4:44:32 PM By: Linton Ham MD Signed: 12/31/2021 5:36:48 PM By: Baruch Gouty RN, BSN Entered By: Baruch Gouty on 12/31/2021 10:23:07 Prescription 12/31/2021 -------------------------------------------------------------------------------- Hechavarria, Almond L. Linton Ham MD Patient Name: Provider: May 20, 1951 [id number] Date of Birth: NPI#: Jerilynn Mages [id number] Sex: DEA #: [phone] [id number] Phone #: License #: [facility] Patient Address: [place], [place] [address] [place], [place] [address] [phone] Allergies No Known Allergies Medication Medication: Route: Strength: Form: doxycycline hyclate oral 100 mg  capsule Class: PERIODONTAL COLLAGENASE INHIBITORS Dose: Frequency / Time: Indication: 1 1 capsule oral 2 times per day for 7 days Number of Refills: Number of Units: 0 Fourteen (14) Capsule(s) Generic Substitution: Start Date: End Date: Administered at Facility: Substitution Permitted 3/53/2992 No Note to Pharmacy: Hand Signature: Date(s): Electronic Signature(s) Signed: 12/31/2021 4:44:32 PM By: Linton Ham MD Signed: 12/31/2021 5:36:48 PM By: Baruch Gouty RN, BSN Entered By: Baruch Gouty on 12/31/2021 10:23:08 -------------------------------------------------------------------------------- Problem List Details Patient Name: Date of Service: [name], [name] 12/31/2021 8:45 A M Medical Record Number: [id number] Patient Account Number: [id number] Date of Birth/Sex: Treating RN: 25-Apr-1951 (71 y.o. [name] Primary Care Provider: PCP, NO Other Clinician: Referring Provider: Treating Provider/Extender: Allena Katz in Treatment: 2 Active Problems ICD-10 Encounter Code Description Active Date MDM Diagnosis E11.621 Type 2 diabetes mellitus with foot ulcer 12/17/2021 No Yes L97.528 Non-pressure chronic ulcer of other part of left foot with other specified 12/17/2021 No Yes severity I87.323 Chronic venous hypertension (idiopathic) with inflammation of bilateral lower 12/17/2021 No Yes extremity Inactive Problems Resolved Problems Electronic Signature(s) Signed: 12/31/2021 4:44:32 PM By: Linton Ham MD Entered By: Linton Ham on 12/31/2021 10:28:39 -------------------------------------------------------------------------------- Progress Note Details Patient Name: Date of Service: [name], [name] 12/31/2021 8:45 A M Medical Record Number: [id number] Patient Account Number: [id number] Date of Birth/Sex: Treating RN: November 27, 1951 (71 y.o. [name] Primary Care Provider: PCP, NO Other Clinician: Referring  Provider: Treating Provider/Extender: Allena Katz in Treatment: 2 Subjective History of Present Illness (HPI) Nameer Summer is a 71 year old male with a past medical history of type 2 diabetes and essential hypertension who presents to the clinic today for bilateral lower extremity wounds. He was recently seen in the ED on 3/9 for bilateral lower extremity edema. He had a DVT study that was negative for clots. He was given triamcinolone cream and advised to elevate legs and obtain compression stockings. He has been using the triamcinolone cream however he is unable to use compression stockings due to difficulty of putting on. Patient states that for the past 2 years he has had opening and closing of weeping wounds to his legs bilaterally. They spontaneously open and close. They are often open for several weeks before they heal spontaneously. He denies any purulent drainage, increased warmth or increased erythema to the skin. 4/21; patient presents for 1 week follow-up. The wounds on the right leg have healed however he has now 2 new wounds 1 on the anterior and posterior thigh that occurred from him scratching underneath the wrap. The left leg wound has improved and almost closed 4/28; patient presents for 1 week follow-up. He continues to have a wound on the right anterior leg that is showing signs of healing. The posterior right thigh has closed. He now has a new wound to the left leg that is limited to skin breakdown. 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 The wounds are occurring because he goes underneath the leg wraps and scratches creating new wounds. He has no complaints today. 5/5; patient presents for 1 week follow-up. He no longer has any open wounds to his lower extremities bilaterally. He continues to  have scattered excoriation marks from where he scratches. These are scabbed over. He  states he is going to see dermatology this month. Overall he is doing well and is happy with his care. Readmission 6/10 Patient was followed for open wounds limited to skin breakdown to his lower extremities bilaterally 2/2 venous insufficiency. Once the wounds healed he was discharged with juxta lite compressions and these were placed in office. He was discharged on 5/5. He states that he has not taken the wraps off since discharge and has had them on for over a month. He did not want to take them off because they were very comfortable. He does not have any difficulty putting these on and off if he wanted to. He states that he does not take a shower and just cleans the other parts of his body at the sink. He noticed that 2 days ago he was having some drainage at the top of the wrap and called in to be evaluated. He states that he has not seen the wounds until the wraps were taken off today in office. He denies pain or increased warmth or erythema to the area 6/27; patient presents for 2-week follow-up. He has had increased swelling and drainage to the wounds over the last week. He reports increased erythema to the area. He does not have pain however. He denies purulent drainage or fever/chills. 7/5; patient presents for 1 week follow-up. He was prescribed Keflex at last clinic visit and is currently taking this. He reports significant improvement to his leg swelling, tenderness and erythema. He denies signs of infection. 7/21; patient presents for 2-week follow-up. He reports improvement in healing in his right lower extremity. Unfortunately this morning he noticed skin breakdown to his left lower extremity with increased redness and warmth. 7/28; patient presents for 1 week follow-up. He reports completing his antibiotics. He states that his wounds are healed. He has been using his juxta light compressions. He has no issues or complaints today. Readmission 12/17/2021; This is a 71 year old man who  has been seen twice by Dr. Heber [facility] for wounds on his bilateral lower extremities secondary to chronic venous insufficiency. Most recently this was from 05/22/2021 through 07/09/2021 with bilateral lower extremity wounds largely secondary to chronic venous insufficiency with stasis dermatitis. He has been discharged with juxta lite stockings. The patient tells me that about a month ago he developed too much edema to get his juxta lites on. He developed increased swelling. He saw his primary physician who gave him Lasix and the swelling seems to have come down. 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 He says this dates back into his adolescence and he could not walk barefoot etc. As a result of this he sometimes leaves his shoes and socks on for weeks and he is done that recently.[name] He arrives in clinic with not any wounds on his legs however with close inspection he had a fairly deep wound in the left second and third toe webspace His skin on his feet was not in good shape macerated flaking malodorous The patient's past medical history has not changed. I note that he had arterial studies done on 06/03/2021 that showed an ABI on the right of 1.26 with triphasic waveforms TBI of 0.63 on the right on the left ABI of 0.85 TBI of 0.63 again with triphasic waveforms 1/19; since the patient was last here he was seen in the hospital for bradycardia. He had his  medications adjusted digoxin metoprolol. He tells me they dressed the wound between the left second and third toe. The patient says that he had open wounds on his dorsal foot when we took off the dressing today. I was not exactly sure what he had on their. He had 2 wounds on the left dorsal foot with some significant surrounding erythema.The wound between the left second and third toe was just about closed however when we opened it to examine it today it it opened and a small area. The skin on his lower  legs especially the left looks a lot better using triamcinolone and Cetaphil which she is applying He was on cephalexin prior described I think by primary care before he came here he completed this about 3 or 4 days ago Objective Constitutional Sitting or standing Blood Pressure is within target range for patient.. Pulse regular and within target range for patient.[name] Respirations regular, non-labored and within target range.. Temperature is normal and within the target range for the patient.[name] Appears in no distress. Vitals Time Taken: 9:26 AM, Height: 75 in, Source: Stated, Weight: 245 lbs, Source: Stated, BMI: 30.6, Temperature: 98.6 F, Pulse: 62 bpm, Respiratory Rate: 18 breaths/min, Blood Pressure: 137/72 mmHg, Capillary Blood Glucose: 134 mg/dl. General Notes: glucose per pt report this am Cardiovascular Pedal pulses are palpable. General Notes: Wound exam; he does not have any open areas on his legs and his skin looks better on the left. However he had 2 new open areas on the left dorsal foot with extensive erythema it over a small area. He has a small slip between the left second and third toes as described. Does not have a lot of pain in the dorsal foot however he may have neuropathy. There is no arterial issues here. Integumentary (Hair, Skin) Wound #8 status is Open. Original cause of wound was Gradually Appeared. The date acquired was: 12/14/2021. The wound has been in treatment 2 weeks. The wound is located on the Left T - Web between 2nd and 3rd. The wound measures 0.5cm length x 0.2cm width x 0.1cm depth; 0.079cm^2 area and 0.008cm^3 oe volume. There is Fat Layer (Subcutaneous Tissue) exposed. There is no tunneling or undermining noted. There is a small amount of serous drainage noted. The wound margin is flat and intact. There is large (67-100%) pink granulation within the wound bed. There is no necrotic tissue within the wound bed. Wound #9 status is Open. Original cause of wound  was Gradually Appeared. The date acquired was: 12/28/2021. The wound is located on the Left,Dorsal Foot. The wound measures 1.6cm length x 2.5cm width x 0.1cm depth; 3.142cm^2 area and 0.314cm^3 volume. There is Fat Layer (Subcutaneous Tissue) exposed. There is no tunneling or undermining noted. There is a medium amount of serosanguineous drainage noted. The wound margin is flat and intact. There is large (67-100%) red granulation within the wound bed. There is no necrotic tissue within the wound bed. Assessment Active Problems ICD-10 Type 2 diabetes mellitus with foot ulcer Non-pressure chronic ulcer of other part of left foot with other specified severity Chronic venous hypertension (idiopathic) with inflammation of bilateral lower extremity Plan 1 Follow-up Appointments: Return Appointment in 1 week. - Dr. Dellia Nims Bathing/ Shower/ Hygiene: May shower and wash wound with soap and water. - every other day Edema Control - Lymphedema / SCD / Other: Exercise regularly Moisturize legs daily. - cetaphi/triamcinolone cream to legs and feet nightly before bed Compression stocking or Garment 20-30 mm/Hg pressure to: - juxtalite compression  garments both legs daily Home Health: Dressing changes to be completed by [place] on Tuesday / Thursday / Saturday except when patient has scheduled visit at [facility]. Other Home Health Orders/Instructions: - [facility] The following medication(s) was prescribed: doxycycline hyclate oral 100 mg capsule 1 1 capsule oral 2 times per day for 7 days starting 12/31/2021 WOUND #8: - T - Web between 2nd and 3rd Wound Laterality: Left oe Peri-Wound Care: prescription cetaphil/triamcinolone cream Every Other Day/30 Days Discharge Instructions: apply to feet and legs nightly Prim Dressing: KerraCel Ag Gelling Fiber Dressing, 2x2 in (silver alginate) Every Other Day/30 Days ary Discharge Instructions: Apply silver alginate to wound bed as instructed Secondary  Dressing: Woven Gauze Sponge, Non-Sterile 4x4 in Every Other Day/30 Days Discharge Instructions: Apply over primary dressing as directed. Secured With: Child psychotherapist, Sterile 2x75 (in/in) Every Other Day/30 Days Discharge Instructions: Secure with stretch gauze as directed. WOUND #9: - Foot Wound Laterality: Dorsal, Left Peri-Wound Care: prescription cetaphil/triamcinolone cream Every Other Day/30 Days Discharge Instructions: apply to feet and legs nightly Prim Dressing: KerraCel Ag Gelling Fiber Dressing, 2x2 in (silver alginate) Every Other Day/30 Days ary Discharge Instructions: Apply silver alginate to wound bed as instructed Secondary Dressing: Woven Gauze Sponge, Non-Sterile 4x4 in Every Other Day/30 Days Discharge Instructions: Apply over primary dressing as directed. Secured With: Child psychotherapist, Sterile 2x75 (in/in) Every Other Day/30 Days Discharge Instructions: Secure with stretch gauze as directed. . I gave him doxycycline empirically for the erythema around the 2 wounds on the dorsal foot. I did not see anything to be a useful culture. This could be a wrap injury I have marked this area for follow-up. 2. Silver alginate to the wounds and gauze. He has home health coming out to change the dressing at his assisted living 3. Hard prescription for doxycycline as he is in an assisted living 4. His legs actually look quite good. 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 12/31/2021 Medical Record Number: [id number] Patient Account Number: [id number] Date of  Birth/Sex: Treating RN: Apr 19, 1951 (71 y.o. [name] Primary Care Provider: PCP, NO Other Clinician: Referring Provider: Treating Provider/Extender: Allena Katz in Treatment: 2 Diagnosis Coding ICD-10 Codes Code Description [phone] Type 2 diabetes mellitus with foot ulcer L97.528 Non-pressure chronic ulcer of other part of left foot with other specified severity I87.323 Chronic venous hypertension (idiopathic) with inflammation of bilateral lower extremity Facility Procedures CPT4 Code: [id number] Description: 99214 - WOUND CARE VISIT-LEV 4 EST PT Modifier: Quantity: 1 Physician Procedures : CPT4 Code Description Modifier [id number] - WC PHYS LEVEL 4 - EST PT ICD-10 Diagnosis Description E11.621 Type 2 diabetes mellitus with foot ulcer L97.528 Non-pressure chronic ulcer of other part of left foot with other specified severity Quantity: 1 Electronic Signature(s) Signed: 12/31/2021 4:44:32 PM By: Linton Ham MD Entered By: Linton Ham on 12/31/2021 10:40:06

## 2022-01-07 ENCOUNTER — Other Ambulatory Visit: Payer: Self-pay

## 2022-01-07 ENCOUNTER — Encounter (HOSPITAL_BASED_OUTPATIENT_CLINIC_OR_DEPARTMENT_OTHER): Payer: Medicare Other | Admitting: Internal Medicine

## 2022-01-07 DIAGNOSIS — E11621 Type 2 diabetes mellitus with foot ulcer: Secondary | ICD-10-CM | POA: Diagnosis not present

## 2022-01-07 NOTE — Progress Notes (Signed)
HILDRETH, ROBART ([id number]) Visit Report for 01/07/2022 Arrival Information Details Patient Name: Date of Service: [name] 01/07/2022 10:15 A M Medical Record Number: [id number] Patient Account Number: [id number] Date of Birth/Sex: Treating RN: [birth year] (71 y.o. [name] Primary Care [name]: PCP, NO Other Clinician: Referring [name]: Treating [name]/Extender: [name] in Treatment: 3 Visit Information History Since Last Visit Added or deleted any medications: No Patient Arrived: Ambulatory Any new allergies or adverse reactions: No Arrival Time: 10:20 Had a fall or experienced change in No Accompanied By: self activities of daily living that may affect Transfer Assistance: None risk of falls: Patient Identification Verified: Yes Signs or symptoms of abuse/neglect since last visito No Secondary Verification Process Completed: Yes Hospitalized since last visit: No Patient Requires Transmission-Based Precautions: No Implantable device outside of the clinic excluding No Patient Has Alerts: Yes cellular tissue based products placed in the center Patient Alerts: R and L ABIs N/C since last visit: Has Dressing in Place as Prescribed: Yes Has Compression in Place as Prescribed: Yes Pain Present Now: No Electronic Signature(s) Signed: 01/07/2022 6:32:11 PM By: Baruch Gouty RN, BSN Entered By: Baruch Gouty on 01/07/2022 10:22:28 -------------------------------------------------------------------------------- Clinic Level of Care Assessment Details Patient Name: Date of Service: [name], Allen Allen L. 01/07/2022 10:15 A M Medical Record Number: [id number] Patient Account Number: [id number] Date of Birth/Sex: Treating RN: [birth year] (71 y.o. [name] Primary Care Codie Krogh: PCP, NO Other Clinician: Referring Shaquil Aldana: Treating Ranbir Chew/Extender: [name] in Treatment: 3 Clinic Level of Care  Assessment Items TOOL 4 Quantity Score []  - 0 Use when only an EandM is performed on FOLLOW-UP visit ASSESSMENTS - Nursing Assessment / Reassessment X- 1 10 Reassessment of Co-morbidities (includes updates in patient status) X- 1 5 Reassessment of Adherence to Treatment Plan ASSESSMENTS - Wound and Skin A ssessment / Reassessment []  - 0 Simple Wound Assessment / Reassessment - one wound X- 2 5 Complex Wound Assessment / Reassessment - multiple wounds []  - 0 Dermatologic / Skin Assessment (not related to wound area) ASSESSMENTS - Focused Assessment X- 1 5 Circumferential Edema Measurements - multi extremities []  - 0 Nutritional Assessment / Counseling / Intervention X- 1 5 Lower Extremity Assessment (monofilament, tuning fork, pulses) []  - 0 Peripheral Arterial Disease Assessment (using hand held doppler) ASSESSMENTS - Ostomy and/or Continence Assessment and Care []  - 0 Incontinence Assessment and Management []  - 0 Ostomy Care Assessment and Management (repouching, etc.) PROCESS - Coordination of Care X - Simple Patient / Family Education for ongoing care 1 15 []  - 0 Complex (extensive) Patient / Family Education for ongoing care X- 1 10 Staff obtains Programmer, systems, Records, T Results / Process Orders est X- 1 10 Staff telephones HHA, Nursing Homes / Clarify orders / etc []  - 0 Routine Transfer to another Facility (non-emergent condition) []  - 0 Routine Hospital Admission (non-emergent condition) []  - 0 New Admissions / Biomedical engineer / Ordering NPWT Apligraf, etc. , []  - 0 Emergency Hospital Admission (emergent condition) X- 1 10 Simple Discharge Coordination []  - 0 Complex (extensive) Discharge Coordination PROCESS - Special Needs []  - 0 Pediatric / Minor Patient Management []  - 0 Isolation Patient Management []  - 0 Hearing / Language / Visual special needs []  - 0 Assessment of Community assistance (transportation, D/C planning, etc.) []  -  0 Additional assistance / Altered mentation []  - 0 Support Surface(s) Assessment (bed, cushion, seat, etc.) INTERVENTIONS - Wound Cleansing / Measurement []  -  0 Simple Wound Cleansing - one wound X- 2 5 Complex Wound Cleansing - multiple wounds X- 1 5 Wound Imaging (photographs - any number of wounds) []  - 0 Wound Tracing (instead of photographs) []  - 0 Simple Wound Measurement - one wound X- 2 5 Complex Wound Measurement - multiple wounds INTERVENTIONS - Wound Dressings X - Small Wound Dressing one or multiple wounds 1 10 []  - 0 Medium Wound Dressing one or multiple wounds []  - 0 Large Wound Dressing one or multiple wounds []  - 0 Application of Medications - topical []  - 0 Application of Medications - injection INTERVENTIONS - Miscellaneous []  - 0 External ear exam []  - 0 Specimen Collection (cultures, biopsies, blood, body fluids, etc.) []  - 0 Specimen(s) / Culture(s) sent or taken to Lab for analysis []  - 0 Patient Transfer (multiple staff / Civil Service fast streamer / Similar devices) []  - 0 Simple Staple / Suture removal (25 or less) []  - 0 Complex Staple / Suture removal (26 or more) []  - 0 Hypo / Hyperglycemic Management (close monitor of Blood Glucose) []  - 0 Ankle / Brachial Index (ABI) - do not check if billed separately X- 1 5 Vital Signs Has the patient been seen at the hospital within the last three years: Yes Total Score: 120 Level Of Care: New/Established - Level 4 Electronic Signature(s) Signed: 01/07/2022 6:32:11 PM By: Baruch Gouty RN, BSN Entered By: Baruch Gouty on 01/07/2022 10:48:41 -------------------------------------------------------------------------------- Encounter Discharge Information Details Patient Name: Date of Service: [name], Allen Allen L. 01/07/2022 10:15 A M Medical Record Number: [id number] Patient Account Number: [id number] Date of Birth/Sex: Treating RN: December 21, 1950 (71 y.o. [name] Primary Care Rella Egelston: PCP, NO Other  Clinician: Referring Bently Morath: Treating Aryana Wonnacott/Extender: [name] in Treatment: 3 Encounter Discharge Information Items Discharge Condition: Stable Ambulatory Status: Ambulatory Discharge Destination: Home Transportation: Other Accompanied By: self Schedule Follow-up Appointment: Yes Clinical Summary of Care: Patient Declined Notes facility transportation Electronic Signature(s) Signed: 01/07/2022 6:32:11 PM By: Baruch Gouty RN, BSN Entered By: Baruch Gouty on 01/07/2022 11:00:46 -------------------------------------------------------------------------------- Lower Extremity Assessment Details Patient Name: Date of Service: [name], Allen Allen L. 01/07/2022 10:15 A M Medical Record Number: [id number] Patient Account Number: [id number] Date of Birth/Sex: Treating RN: 1951/10/09 (71 y.o. [name] Primary Care Jewelz Kobus: PCP, NO Other Clinician: Referring Othel Hoogendoorn: Treating Lurlie Wigen/Extender: Marylin Crosby Weeks in Treatment: 3 Edema Assessment Assessed: [Left: No] [Right: No] Edema: [Left: Ye] [Right: s] Calf Left: Right: Point of Measurement: From Medial Instep 40 cm Ankle Left: Right: Point of Measurement: From Medial Instep 28.5 cm Vascular Assessment Pulses: Dorsalis Pedis Palpable: [Left:Yes] Electronic Signature(s) Signed: 01/07/2022 6:32:11 PM By: Baruch Gouty RN, BSN Entered By: Baruch Gouty on 01/07/2022 10:37:18 -------------------------------------------------------------------------------- Multi Wound Chart Details Patient Name: Date of Service: [name], Allen Allen L. 01/07/2022 10:15 A M Medical Record Number: [id number] Patient Account Number: [id number] Date of Birth/Sex: Treating RN: Jun 18, 1951 (71 y.o. [name] Primary Care Rogen Porte: PCP, NO Other Clinician: Referring Shawntee Mainwaring: Treating Natalio Salois/Extender: [name] in Treatment: 3 Vital Signs Height(in):  75 Capillary Blood Glucose(mg/dl): 180 Weight(lbs): 245 Pulse(bpm): 60 Body Mass Index(BMI): 30.6 Blood Pressure(mmHg): 111/68 Temperature(F): 98.4 Respiratory Rate(breaths/min): 18 Photos: [N/A:N/A] Left T - Web between 2nd and 3rd oe Left, Dorsal Foot N/A Wound Location: Gradually Appeared Gradually Appeared N/A Wounding Event: Diabetic Wound/Ulcer of the Lower Diabetic Wound/Ulcer of the Lower N/A Primary Etiology: Extremity Extremity N/A Lymphedema N/A Secondary Etiology: Cataracts, Lymphedema,  Arrhythmia, Cataracts, Lymphedema, Arrhythmia, N/A Comorbid History: Peripheral Venous Disease, Type II Peripheral Venous Disease, Type II Diabetes, Osteoarthritis, Neuropathy Diabetes, Osteoarthritis, Neuropathy 12/14/2021 12/28/2021 N/A Date Acquired: 3 1 N/A Weeks of Treatment: Healed - Epithelialized Open N/A Wound Status: No No N/A Wound Recurrence: No Yes N/A Clustered Wound: N/A 2 N/A Clustered Quantity: 0x0x0 0.7x0.4x0.1 N/A Measurements L x W x D (cm) 0 0.22 N/A A (cm) : rea 0 0.022 N/A Volume (cm) : 100.00% 93.00% N/A % Reduction in A rea: 100.00% 93.00% N/A % Reduction in Volume: Grade 1 Grade 1 N/A Classification: None Present Small N/A Exudate A mount: N/A Serosanguineous N/A Exudate Type: N/A red, brown N/A Exudate Color: N/A Flat and Intact N/A Wound Margin: None Present (0%) Large (67-100%) N/A Granulation A mount: N/A Red N/A Granulation Quality: None Present (0%) None Present (0%) N/A Necrotic A mount: Fascia: No Fat Layer (Subcutaneous Tissue): Yes N/A Exposed Structures: Fat Layer (Subcutaneous Tissue): No Fascia: No Tendon: No Tendon: No Muscle: No Muscle: No Joint: No Joint: No Bone: No Bone: No Large (67-100%) Small (1-33%) N/A Epithelialization: Treatment Notes Wound #9 (Foot) Wound Laterality: Dorsal, Left Cleanser Peri-Wound Care prescription cetaphil/triamcinolone cream Discharge Instruction: apply to feet and  legs nightly Topical Primary Dressing KerraCel Ag Gelling Fiber Dressing, 2x2 in (silver alginate) Discharge Instruction: Apply silver alginate to wound bed as instructed Secondary Dressing Woven Gauze Sponge, Non-Sterile 4x4 in Discharge Instruction: Apply over primary dressing as directed. Secured With Conforming Stretch Gauze Bandage, Sterile 2x75 (in/in) Discharge Instruction: Secure with stretch gauze as directed. Compression Wrap Compression Stockings Add-Ons Electronic Signature(s) Signed: 01/07/2022 6:04:31 PM By: Linton Ham MD Signed: 01/07/2022 6:32:11 PM By: Baruch Gouty RN, BSN Entered By: Linton Ham on 01/07/2022 11:22:11 -------------------------------------------------------------------------------- Multi-Disciplinary Care Plan Details Patient Name: Date of Service: [name], Allen Allen L. 01/07/2022 10:15 A M Medical Record Number: [id number] Patient Account Number: [id number] Date of Birth/Sex: Treating RN: 1951/03/24 (71 y.o. [name] Primary Care Blaire Palomino: PCP, NO Other Clinician: Referring Keean Wilmeth: Treating Stacy Deshler/Extender: [name] in Treatment: 3 Multidisciplinary Care Plan reviewed with physician Active Inactive Nutrition Nursing Diagnoses: Impaired glucose control: actual or potential Potential for alteratiion in Nutrition/Potential for imbalanced nutrition Goals: Patient/caregiver will maintain therapeutic glucose control Date Initiated: 12/17/2021 Target Resolution Date: 01/14/2022 Goal Status: Active Interventions: Assess HgA1c results as ordered upon admission and as needed Assess patient nutrition upon admission and as needed per policy Provide education on elevated blood sugars and impact on wound healing Provide education on nutrition Treatment Activities: Patient referred to Primary Care Physician for further nutritional evaluation : 12/17/2021 Notes: Wound/Skin Impairment Nursing  Diagnoses: Impaired tissue integrity Knowledge deficit related to ulceration/compromised skin integrity Goals: Patient/caregiver will verbalize understanding of skin care regimen Date Initiated: 12/17/2021 Target Resolution Date: 01/15/2022 Goal Status: Active Ulcer/skin breakdown will have a volume reduction of 30% by week 4 Date Initiated: 12/17/2021 Target Resolution Date: 01/14/2022 Goal Status: Active Interventions: Assess patient/caregiver ability to obtain necessary supplies Assess patient/caregiver ability to perform ulcer/skin care regimen upon admission and as needed Assess ulceration(s) every visit Provide education on ulcer and skin care Treatment Activities: Skin care regimen initiated : 12/17/2021 Topical wound management initiated : 12/17/2021 Notes: Electronic Signature(s) Signed: 01/07/2022 6:32:11 PM By: Baruch Gouty RN, BSN Entered By: Baruch Gouty on 01/07/2022 10:41:50 -------------------------------------------------------------------------------- Pain Assessment Details Patient Name: Date of Service: [name], Allen Allen L. 01/07/2022 10:15 A M Medical Record Number: [id number] Patient Account Number: [id number] Date of Birth/Sex:  Treating RN: 08-11-1951 (71 y.o. [name] Primary Care Raela Bohl: PCP, NO Other Clinician: Referring Yolando Gillum: Treating Tiawana Forgy/Extender: [name] in Treatment: 3 Active Problems Location of Pain Severity and Description of Pain Patient Has Paino No Site Locations Rate the pain. Rate the pain. Current Pain Level: 0 Pain Management and Medication Current Pain Management: Electronic Signature(s) Signed: 01/07/2022 6:32:11 PM By: Baruch Gouty RN, BSN Entered By: Baruch Gouty on 01/07/2022 10:37:39 -------------------------------------------------------------------------------- Patient/Caregiver Education Details Patient Name: Date of Service: [name], Allen Centuria 1/26/2023andnbsp10:15 [place] Record Number: [id number] Patient Account Number: [id number] Date of Birth/Gender: Treating RN: 06-Nov-1951 (71 y.o. [name] Primary Care Physician: PCP, NO Other Clinician: Referring Physician: Treating Physician/Extender: [name] in Treatment: 3 Education Assessment Education Provided To: Patient Education Topics Provided Venous: Methods: Explain/Verbal Responses: Reinforcements needed, State content correctly Wound/Skin Impairment: Methods: Explain/Verbal Responses: Reinforcements needed, State content correctly Electronic Signature(s) Signed: 01/07/2022 6:32:11 PM By: Baruch Gouty RN, BSN Entered By: Baruch Gouty on 01/07/2022 10:42:46 -------------------------------------------------------------------------------- Wound Assessment Details Patient Name: Date of Service: [name], Allen Allen L. 01/07/2022 10:15 A M Medical Record Number: [id number] Patient Account Number: [id number] Date of Birth/Sex: Treating RN: [birth year] (71 y.o. [name] Primary Care Maylie Ashton: PCP, NO Other Clinician: Referring Anabelle Bungert: Treating Solstice Lastinger/Extender: Marylin Crosby Weeks in Treatment: 3 Wound Status Wound Number: 8 Primary Diabetic Wound/Ulcer of the Lower Extremity Etiology: Wound Location: Left T - Web between 2nd and 3rd oe Wound Healed - Epithelialized Wounding Event: Gradually Appeared Status: Date Acquired: 12/14/2021 Comorbid Cataracts, Lymphedema, Arrhythmia, Peripheral Venous Disease, Weeks Of Treatment: 3 History: Type II Diabetes, Osteoarthritis, Neuropathy Clustered Wound: No Photos Wound Measurements Length: (cm) Width: (cm) Depth: (cm) Area: (cm) Volume: (cm) 0 % Reduction in Area: 100% 0 % Reduction in Volume: 100% 0 Epithelialization: Large (67-100%) 0 Tunneling: No 0 Undermining: No Wound Description Classification: Grade 1 Exudate Amount: None Present Foul Odor After Cleansing:  No Slough/Fibrino No Wound Bed Granulation Amount: None Present (0%) Exposed Structure Necrotic Amount: None Present (0%) Fascia Exposed: No Fat Layer (Subcutaneous Tissue) Exposed: No Tendon Exposed: No Muscle Exposed: No Joint Exposed: No Bone Exposed: No Electronic Signature(s) Signed: 01/07/2022 3:23:49 PM By: [name] Signed: 01/07/2022 6:02:21 PM By: Deon Pilling RN, BSN Entered By: [name] on 01/07/2022 10:34:58 -------------------------------------------------------------------------------- Wound Assessment Details Patient Name: Date of Service: [name], Allen Allen L. 01/07/2022 10:15 A M Medical Record Number: [id number] Patient Account Number: [id number] Date of Birth/Sex: Treating RN: 01-28-1951 (71 y.o. [name] Primary Care Nyima Vanacker: PCP, NO Other Clinician: Referring Arnisha Laffoon: Treating Dewitt Judice/Extender: Marylin Crosby Weeks in Treatment: 3 Wound Status Wound Number: 9 Primary Diabetic Wound/Ulcer of the Lower Extremity Etiology: Etiology: Wound Location: Left, Dorsal Foot Secondary Lymphedema Wounding Event: Gradually Appeared Etiology: Date Acquired: 12/28/2021 Wound Open Weeks Of Treatment: 1 Status: Clustered Wound: Yes Comorbid Cataracts, Lymphedema, Arrhythmia, Peripheral Venous Disease, History: Type II Diabetes, Osteoarthritis, Neuropathy Photos Wound Measurements Length: (cm) 0.7 Width: (cm) 0.4 Depth: (cm) 0.1 Clustered Quantity: 2 Area: (cm) 0.22 Volume: (cm) 0.022 % Reduction in Area: 93% % Reduction in Volume: 93% Epithelialization: Small (1-33%) Tunneling: No Undermining: No Wound Description Classification: Grade 1 Wound Margin: Flat and Intact Exudate Amount: Small Exudate Type: Serosanguineous Exudate Color: red, brown Foul Odor After Cleansing: No Slough/Fibrino No Wound Bed Granulation Amount: Large (67-100%) Exposed Structure Granulation Quality: Red Fascia Exposed: No Necrotic  Amount: None Present (0%)  Fat Layer (Subcutaneous Tissue) Exposed: Yes Tendon Exposed: No Muscle Exposed: No Joint Exposed: No Bone Exposed: No Treatment Notes Wound #9 (Foot) Wound Laterality: Dorsal, Left Cleanser Peri-Wound Care prescription cetaphil/triamcinolone cream Discharge Instruction: apply to feet and legs nightly Topical Primary Dressing KerraCel Ag Gelling Fiber Dressing, 2x2 in (silver alginate) Discharge Instruction: Apply silver alginate to wound bed as instructed Secondary Dressing Woven Gauze Sponge, Non-Sterile 4x4 in Discharge Instruction: Apply over primary dressing as directed. Secured With Conforming Stretch Gauze Bandage, Sterile 2x75 (in/in) Discharge Instruction: Secure with stretch gauze as directed. Compression Wrap Compression Stockings Add-Ons Electronic Signature(s) Signed: 01/07/2022 3:23:49 PM By: [name] Signed: 01/07/2022 6:02:21 PM By: Deon Pilling RN, BSN Entered By: [name] on 01/07/2022 10:36:06 -------------------------------------------------------------------------------- Vitals Details Patient Name: Date of Service: [name], Allen Allen L. 01/07/2022 10:15 A M Medical Record Number: [id number] Patient Account Number: [id number] Date of Birth/Sex: Treating RN: [birth year] (71 y.o. [name] Primary Care Madisan Bice: PCP, NO Other Clinician: Referring Jelan Batterton: Treating Dayanara Sherrill/Extender: [name] in Treatment: 3 Vital Signs Time Taken: 10:30 Temperature (F): 98.4 Height (in): 75 Pulse (bpm): 60 Source: Stated Respiratory Rate (breaths/min): 18 Weight (lbs): 245 Blood Pressure (mmHg): 111/68 Source: Stated Capillary Blood Glucose (mg/dl): 180 Body Mass Index (BMI): 30.6 Reference Range: 80 - 120 mg / dl Notes glucose per pt report this am Electronic Signature(s) Signed: 01/07/2022 6:32:11 PM By: Baruch Gouty RN, BSN Entered By: Baruch Gouty on 01/07/2022 10:31:10

## 2022-01-07 NOTE — Progress Notes (Signed)
[name], [name] ([id number]) Visit Report for 01/07/2022 HPI Details Patient Name: Date of Service: [name] 01/07/2022 10:15 A M Medical Record Number: [id number] Patient Account Number: [id number] Date of Birth/Sex: Treating RN: Oct 04, 1951 (71 y.o. [name] Primary Care Provider: PCP, NO Other Clinician: Referring Provider: Treating Provider/Extender: [name] in Treatment: 3 History of Present Illness HPI Description: [name] is a 71 year old male with a past medical history of type 2 diabetes and essential hypertension who presents to the clinic today for bilateral lower extremity wounds. He was recently seen in the ED on 3/9 for bilateral lower extremity edema. He had a DVT study that was negative for clots. He was given triamcinolone cream and advised to elevate legs and obtain compression stockings. He has been using the triamcinolone cream however he is unable to use compression stockings due to difficulty of putting on. Patient states that for the past 2 years he has had opening and closing of weeping wounds to his legs bilaterally. They spontaneously open and close. They are often open for several weeks before they heal spontaneously. He denies any purulent drainage, increased warmth or increased erythema to the skin. 4/21; patient presents for 1 week follow-up. The wounds on the right leg have healed however he has now 2 new wounds 1 on the anterior and posterior thigh that occurred from him scratching underneath the wrap. The left leg wound has improved and almost closed 4/28; patient presents for 1 week follow-up. He continues to have a wound on the right anterior leg that is showing signs of healing. The posterior right thigh has closed. He now has a new wound to the left leg that is limited to skin breakdown. 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 The  wounds are occurring because he goes underneath the leg wraps and scratches creating new wounds. He has no complaints today. 5/5; patient presents for 1 week follow-up. He no longer has any open wounds to his lower extremities bilaterally. He continues to have scattered excoriation marks from where he scratches. These are scabbed over. He states he is going to see dermatology this month. Overall he is doing well and is happy with his care. Readmission 6/10 Patient was followed for open wounds limited to skin breakdown to his lower extremities bilaterally 2/2 venous insufficiency. Once the wounds healed he was discharged with juxta lite compressions and these were placed in office. He was discharged on 5/5. He states that he has not taken the wraps off since discharge and has had them on for over a month. He did not want to take them off because they were very comfortable. He does not have any difficulty putting these on and off if he wanted to. He states that he does not take a shower and just cleans the other parts of his body at the sink. He noticed that 2 days ago he was having some drainage at the top of the wrap and called in to be evaluated. He states that he has not seen the wounds until the wraps were taken off today in office. He denies pain or increased warmth or erythema to the area 6/27; patient presents for 2-week follow-up. He has had increased swelling and drainage to the wounds over the last week. He reports increased erythema to the area. He does not have pain however. He denies purulent drainage or fever/chills. 7/5; patient  presents for 1 week follow-up. He was prescribed Keflex at last clinic visit and is currently taking this. He reports significant improvement to his leg swelling, tenderness and erythema. He denies signs of infection. 7/21; patient presents for 2-week follow-up. He reports improvement in healing in his right lower extremity. Unfortunately this morning he noticed  skin breakdown to his left lower extremity with increased redness and warmth. 7/28; patient presents for 1 week follow-up. He reports completing his antibiotics. He states that his wounds are healed. He has been using his juxta light compressions. He has no issues or complaints today. Readmission 12/17/2021; This is a 71 year old man who has been seen twice by Dr. Heber [place] for wounds on his bilateral lower extremities secondary to chronic venous insufficiency. Most recently this was from 05/22/2021 through 07/09/2021 with bilateral lower extremity wounds largely secondary to chronic venous insufficiency with stasis dermatitis. He has been discharged with juxta lite stockings. The patient tells me that about a month ago he developed too much edema to get his juxta lites on. He developed increased swelling. He saw his primary physician who gave him Lasix and the swelling seems to have come down. 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 He says this dates back into his adolescence and he could not walk barefoot etc. As a result of this he sometimes leaves his shoes and socks on for weeks and he is done that recently.[name] He arrives in clinic with not any wounds on his legs however with close inspection he had a fairly deep wound in the left second and third toe webspace His skin on his feet was not in good shape macerated flaking malodorous The patient's past medical history has not changed. I note that he had arterial studies done on 06/03/2021 that showed an ABI on the right of 1.26 with triphasic waveforms TBI of 0.63 on the right on the left ABI of 0.85 TBI of 0.63 again with triphasic waveforms 1/19; since the patient was last here he was seen in the hospital for bradycardia. He had his medications adjusted digoxin metoprolol. He tells me they dressed the wound between the left second and third toe. The patient says that he had open wounds on his dorsal  foot when we took off the dressing today. I was not exactly sure what he had on their. He had 2 wounds on the left dorsal foot with some significant surrounding erythema.The wound between the left second and third toe was just about closed however when we opened it to examine it today it it opened and a small area. The skin on his lower legs especially the left looks a lot better using triamcinolone and Cetaphil which she is applying He was on cephalexin prior described I think by primary care before he came here he completed this about 3 or 4 days ago 1/26; the area between the left second and third toes is healed. He still has 2 small areas on the left dorsal foot. The erythema that I marked around this last week seems better. He is completing his doxycycline. I also gave him triamcinolone and Cetaphil last week for the dry flaking irritated inflamed skin on his dorsal feet and lower legs this all seems better. He has been wearing his juxta lite stockings Electronic Signature(s) Signed: 01/07/2022 6:04:31 PM By: Linton Ham MD Entered By: Linton Ham on 01/07/2022 11:23:19 -------------------------------------------------------------------------------- Physical Exam Details Patient Name: Date of Service: [name] January, Allen  [name] 01/07/2022 10:15 A M Medical Record Number: [id number] Patient Account Number: [id number] Date of Birth/Sex: Treating RN: 1951/12/09 (71 y.o. [name] Primary Care Provider: PCP, NO Other Clinician: Referring Provider: Treating Provider/Extender: Marylin Crosby Weeks in Treatment: 3 Constitutional Sitting or standing Blood Pressure is within target range for patient.. Supine Blood Pressure is within target range for patient.. Pulse regular and within target range for patient.[name] Respirations regular, non-labored and within target range.[name] Appears in no distress. Respiratory work of breathing is normal. Cardiovascular Edema control is  adequate. Notes Wound exam; there is no open areas on his second and third toe webspace no open areas on his left leg. Skin on his dorsal feet and left leg looks a lot better. He has 2 small areas on the dorsal foot this may be closed by next week the area of erythema around this is still present but it is nontender Electronic Signature(s) Signed: 01/07/2022 6:04:31 PM By: Linton Ham MD Entered By: Linton Ham on 01/07/2022 11:24:49 -------------------------------------------------------------------------------- Physician Orders Details Patient Name: Date of Service: [name], Allen [name] 01/07/2022 10:15 A M Medical Record Number: [id number] Patient Account Number: [id number] Date of Birth/Sex: Treating RN: [birth year] (71 y.o. [name] Primary Care Provider: PCP, NO Other Clinician: Referring Provider: Treating Provider/Extender: [name] in Treatment: 3 Verbal / Phone Orders: No Diagnosis Coding ICD-10 Coding Code Description E11.621 Type 2 diabetes mellitus with foot ulcer L97.528 Non-pressure chronic ulcer of other part of left foot with other specified severity I87.323 Chronic venous hypertension (idiopathic) with inflammation of bilateral lower extremity Follow-up Appointments ppointment in 1 week. - Dr. Dellia Nims Return A Bathing/ Shower/ Hygiene May shower and wash wound with soap and water. - every other day Edema Control - Lymphedema / SCD / Other Bilateral Lower Extremities Exercise regularly Moisturize legs daily. - cetaphi/triamcinolone cream to legs and feet nightly before bed Compression stocking or Garment 20-30 mm/Hg pressure to: - juxtalite compression garments both legs daily Home Health Dressing changes to be completed by [place] on Tuesday / Thursday / Saturday except when patient has scheduled visit at [facility] Other Home Health Orders/Instructions: - [facility] Wound Treatment Wound #9 - Foot Wound  Laterality: Dorsal, Left Peri-Wound Care: prescription cetaphil/triamcinolone cream 3 x Per Week/30 Days Discharge Instructions: apply to feet and legs nightly Prim Dressing: KerraCel Ag Gelling Fiber Dressing, 2x2 in (silver alginate) 3 x Per Week/30 Days ary Discharge Instructions: Apply silver alginate to wound bed as instructed Secondary Dressing: Woven Gauze Sponge, Non-Sterile 4x4 in 3 x Per Week/30 Days Discharge Instructions: Apply over primary dressing as directed. Secured With: Child psychotherapist, Sterile 2x75 (in/in) 3 x Per Week/30 Days Discharge Instructions: Secure with stretch gauze as directed. Electronic Signature(s) Signed: 01/07/2022 6:04:31 PM By: Linton Ham MD Signed: 01/07/2022 6:32:11 PM By: Baruch Gouty RN, BSN Entered By: Baruch Gouty on 01/07/2022 10:49:31 -------------------------------------------------------------------------------- Problem List Details Patient Name: Date of Service: [name], Allen [name] 01/07/2022 10:15 A M Medical Record Number: [id number] Patient Account Number: [id number] Date of Birth/Sex: Treating RN: 27-Feb-1951 (71 y.o. [name] Primary Care Provider: PCP, NO Other Clinician: Referring Provider: Treating Provider/Extender: [name] in Treatment: 3 Active Problems ICD-10 Encounter Code Description Active Date MDM Diagnosis E11.621 Type 2 diabetes mellitus with foot ulcer 12/17/2021 No Yes L97.528 Non-pressure chronic ulcer of other part of left foot with other specified 12/17/2021 No Yes severity I87.323 Chronic venous  hypertension (idiopathic) with inflammation of bilateral lower 12/17/2021 No Yes extremity Inactive Problems Resolved Problems Electronic Signature(s) Signed: 01/07/2022 6:04:31 PM By: Linton Ham MD Entered By: Linton Ham on 01/07/2022 11:21:55 -------------------------------------------------------------------------------- Progress Note  Details Patient Name: Date of Service: [name], Allen [name] 01/07/2022 10:15 A M Medical Record Number: [id number] Patient Account Number: [id number] Date of Birth/Sex: Treating RN: Oct 07, 1951 (71 y.o. [name] Primary Care Provider: PCP, NO Other Clinician: Referring Provider: Treating Provider/Extender: [name] in Treatment: 3 Subjective History of Present Illness (HPI) [name] is a 71 year old male with a past medical history of type 2 diabetes and essential hypertension who presents to the clinic today for bilateral lower extremity wounds. He was recently seen in the ED on 3/9 for bilateral lower extremity edema. He had a DVT study that was negative for clots. He was given triamcinolone cream and advised to elevate legs and obtain compression stockings. He has been using the triamcinolone cream however he is unable to use compression stockings due to difficulty of putting on. Patient states that for the past 2 years he has had opening and closing of weeping wounds to his legs bilaterally. They spontaneously open and close. They are often open for several weeks before they heal spontaneously. He denies any purulent drainage, increased warmth or increased erythema to the skin. 4/21; patient presents for 1 week follow-up. The wounds on the right leg have healed however he has now 2 new wounds 1 on the anterior and posterior thigh that occurred from him scratching underneath the wrap. The left leg wound has improved and almost closed 4/28; patient presents for 1 week follow-up. He continues to have a wound on the right anterior leg that is showing signs of healing. The posterior right thigh has closed. He now has a new wound to the left leg that is limited to skin breakdown. 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 The wounds are occurring because he goes underneath the leg wraps and  scratches creating new wounds. He has no complaints today. 5/5; patient presents for 1 week follow-up. He no longer has any open wounds to his lower extremities bilaterally. He continues to have scattered excoriation marks from where he scratches. These are scabbed over. He states he is going to see dermatology this month. Overall he is doing well and is happy with his care. Readmission 6/10 Patient was followed for open wounds limited to skin breakdown to his lower extremities bilaterally 2/2 venous insufficiency. Once the wounds healed he was discharged with juxta lite compressions and these were placed in office. He was discharged on 5/5. He states that he has not taken the wraps off since discharge and has had them on for over a month. He did not want to take them off because they were very comfortable. He does not have any difficulty putting these on and off if he wanted to. He states that he does not take a shower and just cleans the other parts of his body at the sink. He noticed that 2 days ago he was having some drainage at the top of the wrap and called in to be evaluated. He states that he has not seen the wounds until the wraps were taken off today in office. He denies pain or increased warmth or erythema to the area 6/27; patient presents for 2-week follow-up. He has had increased swelling and drainage to  the wounds over the last week. He reports increased erythema to the area. He does not have pain however. He denies purulent drainage or fever/chills. 7/5; patient presents for 1 week follow-up. He was prescribed Keflex at last clinic visit and is currently taking this. He reports significant improvement to his leg swelling, tenderness and erythema. He denies signs of infection. 7/21; patient presents for 2-week follow-up. He reports improvement in healing in his right lower extremity. Unfortunately this morning he noticed skin breakdown to his left lower extremity with increased  redness and warmth. 7/28; patient presents for 1 week follow-up. He reports completing his antibiotics. He states that his wounds are healed. He has been using his juxta light compressions. He has no issues or complaints today. Readmission 12/17/2021; This is a 71 year old man who has been seen twice by Dr. Heber [place] for wounds on his bilateral lower extremities secondary to chronic venous insufficiency. Most recently this was from 05/22/2021 through 07/09/2021 with bilateral lower extremity wounds largely secondary to chronic venous insufficiency with stasis dermatitis. He has been discharged with juxta lite stockings. The patient tells me that about a month ago he developed too much edema to get his juxta lites on. He developed increased swelling. He saw his primary physician who gave him Lasix and the swelling seems to have come down. 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 He says this dates back into his adolescence and he could not walk barefoot etc. As a result of this he sometimes leaves his shoes and socks on for weeks and he is done that recently.[name] He arrives in clinic with not any wounds on his legs however with close inspection he had a fairly deep wound in the left second and third toe webspace His skin on his feet was not in good shape macerated flaking malodorous The patient's past medical history has not changed. I note that he had arterial studies done on 06/03/2021 that showed an ABI on the right of 1.26 with triphasic waveforms TBI of 0.63 on the right on the left ABI of 0.85 TBI of 0.63 again with triphasic waveforms 1/19; since the patient was last here he was seen in the hospital for bradycardia. He had his medications adjusted digoxin metoprolol. He tells me they dressed the wound between the left second and third toe. The patient says that he had open wounds on his dorsal foot when we took off the dressing today. I was  not exactly sure what he had on their. He had 2 wounds on the left dorsal foot with some significant surrounding erythema.The wound between the left second and third toe was just about closed however when we opened it to examine it today it it opened and a small area. The skin on his lower legs especially the left looks a lot better using triamcinolone and Cetaphil which she is applying He was on cephalexin prior described I think by primary care before he came here he completed this about 3 or 4 days ago 1/26; the area between the left second and third toes is healed. He still has 2 small areas on the left dorsal foot. The erythema that I marked around this last week seems better. He is completing his doxycycline. I also gave him triamcinolone and Cetaphil last week for the dry flaking irritated inflamed skin on his dorsal feet and lower legs this all seems better. He has been wearing his juxta lite stockings Objective Constitutional Sitting  or standing Blood Pressure is within target range for patient.. Supine Blood Pressure is within target range for patient.. Pulse regular and within target range for patient.[name] Respirations regular, non-labored and within target range.[name] Appears in no distress. Vitals Time Taken: 10:30 AM, Height: 75 in, Source: Stated, Weight: 245 lbs, Source: Stated, BMI: 30.6, Temperature: 98.4 F, Pulse: 60 bpm, Respiratory Rate: 18 breaths/min, Blood Pressure: 111/68 mmHg, Capillary Blood Glucose: 180 mg/dl. General Notes: glucose per pt report this am Respiratory work of breathing is normal. Cardiovascular Edema control is adequate. General Notes: Wound exam; there is no open areas on his second and third toe webspace no open areas on his left leg. Skin on his dorsal feet and left leg looks a lot better. He has 2 small areas on the dorsal foot this may be closed by next week the area of erythema around this is still present but it is nontender Integumentary (Hair,  Skin) Wound #8 status is Healed - Epithelialized. Original cause of wound was Gradually Appeared. The date acquired was: 12/14/2021. The wound has been in treatment 3 weeks. The wound is located on the Left T - Web between 2nd and 3rd. The wound measures 0cm length x 0cm width x 0cm depth; 0cm^2 area oe and 0cm^3 volume. There is no tunneling or undermining noted. There is a none present amount of drainage noted. There is no granulation within the wound bed. There is no necrotic tissue within the wound bed. Wound #9 status is Open. Original cause of wound was Gradually Appeared. The date acquired was: 12/28/2021. The wound has been in treatment 1 weeks. The wound is located on the Left,Dorsal Foot. The wound measures 0.7cm length x 0.4cm width x 0.1cm depth; 0.22cm^2 area and 0.022cm^3 volume. There is Fat Layer (Subcutaneous Tissue) exposed. There is no tunneling or undermining noted. There is a small amount of serosanguineous drainage noted. The wound margin is flat and intact. There is large (67-100%) red granulation within the wound bed. There is no necrotic tissue within the wound bed. Assessment Active Problems ICD-10 Type 2 diabetes mellitus with foot ulcer Non-pressure chronic ulcer of other part of left foot with other specified severity Chronic venous hypertension (idiopathic) with inflammation of bilateral lower extremity Plan Follow-up Appointments: Return Appointment in 1 week. - Dr. Dellia Nims Bathing/ Shower/ Hygiene: May shower and wash wound with soap and water. - every other day Edema Control - Lymphedema / SCD / Other: Exercise regularly Moisturize legs daily. - cetaphi/triamcinolone cream to legs and feet nightly before bed Compression stocking or Garment 20-30 mm/Hg pressure to: - juxtalite compression garments both legs daily Home Health: Dressing changes to be completed by [place] on Tuesday / Thursday / Saturday except when patient has scheduled visit at [facility]. Other Home Health Orders/Instructions: - [facility] WOUND #9: - Foot Wound Laterality: Dorsal, Left Peri-Wound Care: prescription cetaphil/triamcinolone cream 3 x Per Week/30 Days Discharge Instructions: apply to feet and legs nightly Prim Dressing: KerraCel Ag Gelling Fiber Dressing, 2x2 in (silver alginate) 3 x Per Week/30 Days ary Discharge Instructions: Apply silver alginate to wound bed as instructed Secondary Dressing: Woven Gauze Sponge, Non-Sterile 4x4 in 3 x Per Week/30 Days Discharge Instructions: Apply over primary dressing as directed. Secured With: Child psychotherapist, Sterile 2x75 (in/in) 3 x Per Week/30 Days Discharge Instructions: Secure with stretch gauze as directed. 1. I have continued with the silver cell to the areas on the left dorsal foot. We have continued using his steroid  cream and moisturizer that I put together for him last week. He is using his juxta lite stockings. I think the areas on the dorsal foot should be closed by next week Electronic Signature(s) Signed: 01/07/2022 6:04:31 PM By: Linton Ham MD Entered By: Linton Ham on 01/07/2022 11:25:41 -------------------------------------------------------------------------------- SuperBill Details Patient Name: Date of Service: [name], Allen [name] 01/07/2022 Medical Record Number: [id number] Patient Account Number: [id number] Date of Birth/Sex: Treating RN: [birth year] (71 y.o. [name] Primary Care Provider: PCP, NO Other Clinician: Referring Provider: Treating Provider/Extender: [name] in Treatment: 3 Diagnosis Coding ICD-10 Codes Code Description [id number] Type 2 diabetes mellitus with foot ulcer L97.528 Non-pressure chronic ulcer of other part of left foot with other specified severity I87.323 Chronic venous hypertension (idiopathic) with inflammation of bilateral lower extremity Facility Procedures CPT4 Code: [id number] Description: 99214 -  WOUND CARE VISIT-LEV 4 EST PT Modifier: Quantity: 1 Physician Procedures : CPT4 Code Description Modifier [id number] - WC PHYS LEVEL 3 - EST PT ICD-10 Diagnosis Description E11.621 Type 2 diabetes mellitus with foot ulcer L97.528 Non-pressure chronic ulcer of other part of left foot with other specified severity I87.323  Chronic venous hypertension (idiopathic) with inflammation of bilateral lower extremity Quantity: 1 Electronic Signature(s) Signed: 01/07/2022 6:04:31 PM By: Linton Ham MD Entered By: Linton Ham on 01/07/2022 11:26:02

## 2022-01-14 ENCOUNTER — Other Ambulatory Visit: Payer: Self-pay

## 2022-01-14 ENCOUNTER — Encounter (HOSPITAL_BASED_OUTPATIENT_CLINIC_OR_DEPARTMENT_OTHER): Payer: Medicare Other | Attending: Internal Medicine | Admitting: Internal Medicine

## 2022-01-14 DIAGNOSIS — Z87891 Personal history of nicotine dependence: Secondary | ICD-10-CM | POA: Insufficient documentation

## 2022-01-14 DIAGNOSIS — L97528 Non-pressure chronic ulcer of other part of left foot with other specified severity: Secondary | ICD-10-CM | POA: Insufficient documentation

## 2022-01-14 DIAGNOSIS — E11621 Type 2 diabetes mellitus with foot ulcer: Secondary | ICD-10-CM | POA: Diagnosis present

## 2022-01-14 DIAGNOSIS — I872 Venous insufficiency (chronic) (peripheral): Secondary | ICD-10-CM | POA: Insufficient documentation

## 2022-01-14 DIAGNOSIS — N183 Chronic kidney disease, stage 3 unspecified: Secondary | ICD-10-CM | POA: Diagnosis not present

## 2022-01-14 DIAGNOSIS — I129 Hypertensive chronic kidney disease with stage 1 through stage 4 chronic kidney disease, or unspecified chronic kidney disease: Secondary | ICD-10-CM | POA: Insufficient documentation

## 2022-01-14 DIAGNOSIS — E1122 Type 2 diabetes mellitus with diabetic chronic kidney disease: Secondary | ICD-10-CM | POA: Insufficient documentation

## 2022-01-14 DIAGNOSIS — I890 Lymphedema, not elsewhere classified: Secondary | ICD-10-CM | POA: Diagnosis not present

## 2022-01-14 DIAGNOSIS — L299 Pruritus, unspecified: Secondary | ICD-10-CM | POA: Insufficient documentation

## 2022-01-14 DIAGNOSIS — E1140 Type 2 diabetes mellitus with diabetic neuropathy, unspecified: Secondary | ICD-10-CM | POA: Insufficient documentation

## 2022-01-14 NOTE — Progress Notes (Signed)
[name], [name] ([id number]) Visit Report for 01/14/2022 HPI Details Patient Name: Date of Service: [name], [name]. 01/14/2022 8:00 A M Medical Record Number: [id number] Patient Account Number: [id number] Date of Birth/Sex: Treating RN: 1951-09-16 (71 y.o. [name] Primary Care Provider: PCP, NO Other Clinician: Referring Provider: Treating Provider/Extender: Allena Katz in Treatment: 4 History of Present Illness HPI Description: [name] is a 71 year old male with a past medical history of type 2 diabetes and essential hypertension who presents to the clinic today for bilateral lower extremity wounds. He was recently seen in the ED on 3/9 for bilateral lower extremity edema. He had a DVT study that was negative for clots. He was given triamcinolone cream and advised to elevate legs and obtain compression stockings. He has been using the triamcinolone cream however he is unable to use compression stockings due to difficulty of putting on. Patient states that for the past 2 years he has had opening and closing of weeping wounds to his legs bilaterally. They spontaneously open and close. They are often open for several weeks before they heal spontaneously. He denies any purulent drainage, increased warmth or increased erythema to the skin. 4/21; patient presents for 1 week follow-up. The wounds on the right leg have healed however he has now 2 new wounds 1 on the anterior and posterior thigh that occurred from him scratching underneath the wrap. The left leg wound has improved and almost closed 4/28; patient presents for 1 week follow-up. He continues to have a wound on the right anterior leg that is showing signs of healing. The posterior right thigh has closed. He now has a new wound to the left leg that is limited to skin breakdown. 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 The wounds  are occurring because he goes underneath the leg wraps and scratches creating new wounds. He has no complaints today. 5/5; patient presents for 1 week follow-up. He no longer has any open wounds to his lower extremities bilaterally. He continues to have scattered excoriation marks from where he scratches. These are scabbed over. He states he is going to see dermatology this month. Overall he is doing well and is happy with his care. Readmission 6/10 Patient was followed for open wounds limited to skin breakdown to his lower extremities bilaterally 2/2 venous insufficiency. Once the wounds healed he was discharged with juxta lite compressions and these were placed in office. He was discharged on 5/5. He states that he has not taken the wraps off since discharge and has had them on for over a month. He did not want to take them off because they were very comfortable. He does not have any difficulty putting these on and off if he wanted to. He states that he does not take a shower and just cleans the other parts of his body at the sink. He noticed that 2 days ago he was having some drainage at the top of the wrap and called in to be evaluated. He states that he has not seen the wounds until the wraps were taken off today in office. He denies pain or increased warmth or erythema to the area 6/27; patient presents for 2-week follow-up. He has had increased swelling and drainage to the wounds over the last week. He reports increased erythema to the area. He does not have pain however. He denies purulent drainage or fever/chills. 7/5; patient  presents for 1 week follow-up. He was prescribed Keflex at last clinic visit and is currently taking this. He reports significant improvement to his leg swelling, tenderness and erythema. He denies signs of infection. 7/21; patient presents for 2-week follow-up. He reports improvement in healing in his right lower extremity. Unfortunately this morning he noticed  skin breakdown to his left lower extremity with increased redness and warmth. 7/28; patient presents for 1 week follow-up. He reports completing his antibiotics. He states that his wounds are healed. He has been using his juxta light compressions. He has no issues or complaints today. Readmission 12/17/2021; This is a 71 year old man who has been seen twice by Dr. Heber De Reese for wounds on his bilateral lower extremities secondary to chronic venous insufficiency. Most recently this was from 05/22/2021 through 07/09/2021 with bilateral lower extremity wounds largely secondary to chronic venous insufficiency with stasis dermatitis. He has been discharged with juxta lite stockings. The patient tells me that about a month ago he developed too much edema to get his juxta lites on. He developed increased swelling. He saw his primary physician who gave him Lasix and the swelling seems to have come down. 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 He says this dates back into his adolescence and he could not walk barefoot etc. As a result of this he sometimes leaves his shoes and socks on for weeks and he is done that recently.[name] He arrives in clinic with not any wounds on his legs however with close inspection he had a fairly deep wound in the left second and third toe webspace His skin on his feet was not in good shape macerated flaking malodorous The patient's past medical history has not changed. I note that he had arterial studies done on 06/03/2021 that showed an ABI on the right of 1.26 with triphasic waveforms TBI of 0.63 on the right on the left ABI of 0.85 TBI of 0.63 again with triphasic waveforms 1/19; since the patient was last here he was seen in the hospital for bradycardia. He had his medications adjusted digoxin metoprolol. He tells me they dressed the wound between the left second and third toe. The patient says that he had open wounds on his dorsal  foot when we took off the dressing today. I was not exactly sure what he had on their. He had 2 wounds on the left dorsal foot with some significant surrounding erythema.The wound between the left second and third toe was just about closed however when we opened it to examine it today it it opened and a small area. The skin on his lower legs especially the left looks a lot better using triamcinolone and Cetaphil which she is applying He was on cephalexin prior described I think by primary care before he came here he completed this about 3 or 4 days ago 1/26; the area between the left second and third toes is healed. He still has 2 small areas on the left dorsal foot. The erythema that I marked around this last week seems better. He is completing his doxycycline. I also gave him triamcinolone and Cetaphil last week for the dry flaking irritated inflamed skin on his dorsal feet and lower legs this all seems better. He has been wearing his juxta lite stockings 2/2; everything is healed here. He is now treating his skin with I think both the triamcinolone and Cetaphil mixture that I prescribed and probably pure triamcinolone that the dermatologist  prescribed. I told him to use my compounded steroid cream as a moisture and anti-itch and inflammation cream when he is finished without to go to a pure moisturizer I emphasized that steroids under compression even if its a juxta lite stocking can get absorbed and secondarily can cause skin atrophy. I asked him not to use both. On an optimistic front he seems a lot more interested in treating his skin and watching for areas on his feet that are breaking down. His skin looks a lot better he has a juxta Set designer) Signed: 01/14/2022 4:50:25 PM By: Linton Ham MD Entered By: Linton Ham on 01/14/2022 76:54:65 -------------------------------------------------------------------------------- Physical Exam Details Patient Name: Date  of Service: [name], [name]. 01/14/2022 8:00 A M Medical Record Number: [id number] Patient Account Number: [id number] Date of Birth/Sex: Treating RN: [birth year] (71 y.o. [name] Primary Care Provider: PCP, NO Other Clinician: Referring Provider: Treating Provider/Extender: Allena Katz in Treatment: 4 Constitutional Sitting or standing Blood Pressure is within target range for patient.. Pulse regular and within target range for patient.[name] Respirations regular, non-labored and within target range.. Temperature is normal and within the target range for the patient.[name] Appears in no distress. Notes Wound exam; there is no open area in the second and third toe. The skin on his left leg looks a lot better. He obviously has chronic venous insufficiency and skin changes secondary to this however his edema control is good the inflammation in his leg is gone down and the skin moisturizer has really helped. Much less flaking skin. Electronic Signature(s) Signed: 01/14/2022 4:50:25 PM By: Linton Ham MD Entered By: Linton Ham on 01/14/2022 08:29:11 -------------------------------------------------------------------------------- Physician Orders Details Patient Name: Date of Service: [name], [name]. 01/14/2022 8:00 A M Medical Record Number: [id number] Patient Account Number: [id number] Date of Birth/Sex: Treating RN: 1951-08-06 (71 y.o. [name] Primary Care Provider: PCP, NO Other Clinician: Referring Provider: Treating Provider/Extender: Allena Katz in Treatment: 4 Verbal / Phone Orders: No Diagnosis Coding Discharge From [facility] Services Discharge from [place] Bathing/ Shower/ Hygiene May shower and wash wound with soap and water. Edema Control - Lymphedema / SCD / Other Bilateral Lower Extremities Elevate legs to the level of the heart or above for 30 minutes daily and/or when sitting, a frequency of: -  throughout the day when sitting Avoid standing for long periods of time. Exercise regularly Moisturize legs daily. - cetaphi/triamcinolone cream to legs and feet nightly before bed until runs out then use plain moisturizing lotion Compression stocking or Garment 20-30 mm/Hg pressure to: - juxtalite compression garments both legs daily [place] [facility] for wound care. - wounds are healed Other Home Health Orders/Instructions: - [employer] Electronic Signature(s) Signed: 01/14/2022 4:50:25 PM By: Linton Ham MD Signed: 01/14/2022 6:25:59 PM By: Baruch Gouty RN, BSN Entered By: Baruch Gouty on 01/14/2022 08:15:25 -------------------------------------------------------------------------------- Problem List Details Patient Name: Date of Service: [name], [name]. 01/14/2022 8:00 A M Medical Record Number: [id number] Patient Account Number: [id number] Date of Birth/Sex: Treating RN: 1951-11-09 (71 y.o. [name] Primary Care Provider: PCP, NO Other Clinician: Referring Provider: Treating Provider/Extender: Allena Katz in Treatment: 4 Active Problems ICD-10 Encounter Code Description Active Date MDM Diagnosis E11.621 Type 2 diabetes mellitus with foot ulcer 12/17/2021 No Yes L97.528 Non-pressure chronic ulcer of other part of left foot with other specified 12/17/2021 No Yes severity I87.323 Chronic venous  hypertension (idiopathic) with inflammation of bilateral lower 12/17/2021 No Yes extremity Inactive Problems Resolved Problems Electronic Signature(s) Signed: 01/14/2022 4:50:25 PM By: Linton Ham MD Entered By: Linton Ham on 01/14/2022 08:25:43 -------------------------------------------------------------------------------- Progress Note Details Patient Name: Date of Service: [name], [name]. 01/14/2022 8:00 A M Medical Record Number: [id number] Patient Account Number: [id number] Date of Birth/Sex: Treating  RN: June 17, 1951 (71 y.o. [name] Primary Care Provider: PCP, NO Other Clinician: Referring Provider: Treating Provider/Extender: Allena Katz in Treatment: 4 Subjective History of Present Illness (HPI) Jin Capote is a 71 year old male with a past medical history of type 2 diabetes and essential hypertension who presents to the clinic today for bilateral lower extremity wounds. He was recently seen in the ED on 3/9 for bilateral lower extremity edema. He had a DVT study that was negative for clots. He was given triamcinolone cream and advised to elevate legs and obtain compression stockings. He has been using the triamcinolone cream however he is unable to use compression stockings due to difficulty of putting on. Patient states that for the past 2 years he has had opening and closing of weeping wounds to his legs bilaterally. They spontaneously open and close. They are often open for several weeks before they heal spontaneously. He denies any purulent drainage, increased warmth or increased erythema to the skin. 4/21; patient presents for 1 week follow-up. The wounds on the right leg have healed however he has now 2 new wounds 1 on the anterior and posterior thigh that occurred from him scratching underneath the wrap. The left leg wound has improved and almost closed 4/28; patient presents for 1 week follow-up. He continues to have a wound on the right anterior leg that is showing signs of healing. The posterior right thigh has closed. He now has a new wound to the left leg that is limited to skin breakdown. 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 The wounds are occurring because he goes underneath the leg wraps and scratches creating new wounds. He has no complaints today. 5/5; patient presents for 1 week follow-up. He no longer has any open wounds to his lower extremities bilaterally. He  continues to have scattered excoriation marks from where he scratches. These are scabbed over. He states he is going to see dermatology this month. Overall he is doing well and is happy with his care. Readmission 6/10 Patient was followed for open wounds limited to skin breakdown to his lower extremities bilaterally 2/2 venous insufficiency. Once the wounds healed he was discharged with juxta lite compressions and these were placed in office. He was discharged on 5/5. He states that he has not taken the wraps off since discharge and has had them on for over a month. He did not want to take them off because they were very comfortable. He does not have any difficulty putting these on and off if he wanted to. He states that he does not take a shower and just cleans the other parts of his body at the sink. He noticed that 2 days ago he was having some drainage at the top of the wrap and called in to be evaluated. He states that he has not seen the wounds until the wraps were taken off today in office. He denies pain or increased warmth or erythema to the area 6/27; patient presents for 2-week follow-up. He has had increased swelling and drainage to  the wounds over the last week. He reports increased erythema to the area. He does not have pain however. He denies purulent drainage or fever/chills. 7/5; patient presents for 1 week follow-up. He was prescribed Keflex at last clinic visit and is currently taking this. He reports significant improvement to his leg swelling, tenderness and erythema. He denies signs of infection. 7/21; patient presents for 2-week follow-up. He reports improvement in healing in his right lower extremity. Unfortunately this morning he noticed skin breakdown to his left lower extremity with increased redness and warmth. 7/28; patient presents for 1 week follow-up. He reports completing his antibiotics. He states that his wounds are healed. He has been using his juxta  light compressions. He has no issues or complaints today. Readmission 12/17/2021; This is a 72 year old man who has been seen twice by Dr. Heber Diehlstadt for wounds on his bilateral lower extremities secondary to chronic venous insufficiency. Most recently this was from 05/22/2021 through 07/09/2021 with bilateral lower extremity wounds largely secondary to chronic venous insufficiency with stasis dermatitis. He has been discharged with juxta lite stockings. The patient tells me that about a month ago he developed too much edema to get his juxta lites on. He developed increased swelling. He saw his primary physician who gave him Lasix and the swelling seems to have come down. 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 He says this dates back into his adolescence and he could not walk barefoot etc. As a result of this he sometimes leaves his shoes and socks on for weeks and he is done that recently.[name] He arrives in clinic with not any wounds on his legs however with close inspection he had a fairly deep wound in the left second and third toe webspace His skin on his feet was not in good shape macerated flaking malodorous The patient's past medical history has not changed. I note that he had arterial studies done on 06/03/2021 that showed an ABI on the right of 1.26 with triphasic waveforms TBI of 0.63 on the right on the left ABI of 0.85 TBI of 0.63 again with triphasic waveforms 1/19; since the patient was last here he was seen in the hospital for bradycardia. He had his medications adjusted digoxin metoprolol. He tells me they dressed the wound between the left second and third toe. The patient says that he had open wounds on his dorsal foot when we took off the dressing today. I was not exactly sure what he had on their. He had 2 wounds on the left dorsal foot with some significant surrounding erythema.The wound between the left second and third toe was just  about closed however when we opened it to examine it today it it opened and a small area. The skin on his lower legs especially the left looks a lot better using triamcinolone and Cetaphil which she is applying He was on cephalexin prior described I think by primary care before he came here he completed this about 3 or 4 days ago 1/26; the area between the left second and third toes is healed. He still has 2 small areas on the left dorsal foot. The erythema that I marked around this last week seems better. He is completing his doxycycline. I also gave him triamcinolone and Cetaphil last week for the dry flaking irritated inflamed skin on his dorsal feet and lower legs this all seems better. He has been wearing his juxta lite stockings 2/2; everything is  healed here. He is now treating his skin with I think both the triamcinolone and Cetaphil mixture that I prescribed and probably pure triamcinolone that the dermatologist prescribed. I told him to use my compounded steroid cream as a moisture and anti-itch and inflammation cream when he is finished without to go to a pure moisturizer I emphasized that steroids under compression even if its a juxta lite stocking can get absorbed and secondarily can cause skin atrophy. I asked him not to use both. On an optimistic front he seems a lot more interested in treating his skin and watching for areas on his feet that are breaking down. His skin looks a lot better he has a juxta lite stocking Objective Constitutional Sitting or standing Blood Pressure is within target range for patient.. Pulse regular and within target range for patient.[name] Respirations regular, non-labored and within target range.. Temperature is normal and within the target range for the patient.[name] Appears in no distress. Vitals Time Taken: 8:00 AM, Height: 75 in, Source: Stated, Weight: 245 lbs, Source: Stated, BMI: 30.6, Temperature: 98.5 F, Pulse: 73 bpm, Respiratory Rate: 18 breaths/min,  Blood Pressure: 123/68 mmHg, Capillary Blood Glucose: 154 mg/dl. General Notes: glucose per pt report General Notes: Wound exam; there is no open area in the second and third toe. The skin on his left leg looks a lot better. He obviously has chronic venous insufficiency and skin changes secondary to this however his edema control is good the inflammation in his leg is gone down and the skin moisturizer has really helped. Much less flaking skin. Integumentary (Hair, Skin) Wound #9 status is Open. Original cause of wound was Gradually Appeared. The date acquired was: 12/28/2021. The wound has been in treatment 2 weeks. The wound is located on the Left,Dorsal Foot. The wound measures 0cm length x 0cm width x 0cm depth; 0cm^2 area and 0cm^3 volume. There is no tunneling or undermining noted. There is a none present amount of drainage noted. The wound margin is flat and intact. There is no granulation within the wound bed. There is no necrotic tissue within the wound bed. Assessment Active Problems ICD-10 Type 2 diabetes mellitus with foot ulcer Non-pressure chronic ulcer of other part of left foot with other specified severity Chronic venous hypertension (idiopathic) with inflammation of bilateral lower extremity Plan Discharge From [facility] Services: Discharge from [place] Bathing/ Shower/ Hygiene: May shower and wash wound with soap and water. Edema Control - Lymphedema / SCD / Other: Elevate legs to the level of the heart or above for 30 minutes daily and/or when sitting, a frequency of: - throughout the day when sitting Avoid standing for long periods of time. Exercise regularly Moisturize legs daily. - cetaphi/triamcinolone cream to legs and feet nightly before bed until runs out then use plain moisturizing lotion Compression stocking or Garment 20-30 mm/Hg pressure to: - juxtalite compression garments both legs daily Home Health: [facility] for wound care. - wounds are  healed Other Home Health Orders/Instructions: - [facility] 1. I asked him to continue to use the triamcinolone mixed 1-4 in Cetaphil [0.1% triamcinolone] until he is out of that then to use a pure moisturizer on his skin. I explained the risk of long-term steroids which I am trying to avoid obviously 2. He is moisturizing and using his juxta lite stockings he can be discharged from the clinic and return as needed Electronic Signature(s) Signed: 01/14/2022 4:50:25 PM By: Linton Ham MD Entered By: Linton Ham on 01/14/2022 08:29:59 --------------------------------------------------------------------------------  SuperBill Details Patient Name: Date of Service: [name] 01/14/2022 Medical Record Number: [id number] Patient Account Number: [id number] Date of Birth/Sex: Treating RN: Oct 07, 1951 (71 y.o. [name] Primary Care Provider: PCP, NO Other Clinician: Referring Provider: Treating Provider/Extender: Allena Katz in Treatment: 4 Diagnosis Coding ICD-10 Codes Code Description [phone] Type 2 diabetes mellitus with foot ulcer L97.528 Non-pressure chronic ulcer of other part of left foot with other specified severity I87.323 Chronic venous hypertension (idiopathic) with inflammation of bilateral lower extremity Facility Procedures CPT4 Code: [id number] Description: 99213 - WOUND CARE VISIT-LEV 3 EST PT Modifier: Quantity: 1 Physician Procedures : CPT4 Code Description Modifier [id number] 99213 - WC PHYS LEVEL 3 - EST PT ICD-10 Diagnosis Description E11.621 Type 2 diabetes mellitus with foot ulcer L97.528 Non-pressure chronic ulcer of other part of left foot with other specified severity I87.323  Chronic venous hypertension (idiopathic) with inflammation of bilateral lower extremity Quantity: 1 Electronic Signature(s) Signed: 01/14/2022 4:50:25 PM By: Linton Ham MD Entered By: Linton Ham on 01/14/2022 08:30:19

## 2022-01-14 NOTE — Progress Notes (Signed)
BELTON, PEPLINSKI ([id number]) Visit Report for 01/14/2022 Arrival Information Details Patient Name: Date of Service: [name] Allen L. 01/14/2022 8:00 A M Medical Record Number: [id number] Patient Account Number: [id number] Date of Birth/Sex: Treating RN: 1950/12/19 (71 y.o. [name] Primary Care [name]: PCP, NO Other Clinician: Referring Xuan Mateus: Treating Alica Shellhammer/Extender: Allena Katz in Treatment: 4 Visit Information History Since Last Visit Added or deleted any medications: No Patient Arrived: Ambulatory Any new allergies or adverse reactions: No Arrival Time: 07:59 Had a fall or experienced change in No Accompanied By: self activities of daily living that may affect Transfer Assistance: None risk of falls: Patient Identification Verified: Yes Signs or symptoms of abuse/neglect since last visito No Secondary Verification Process Completed: Yes Hospitalized since last visit: No Patient Requires Transmission-Based Precautions: No Implantable device outside of the clinic excluding No Patient Has Alerts: Yes cellular tissue based products placed in the center Patient Alerts: R and L ABIs N/C since last visit: Has Dressing in Place as Prescribed: Yes Has Compression in Place as Prescribed: Yes Pain Present Now: No Electronic Signature(s) Signed: 01/14/2022 6:25:59 PM By: Baruch Gouty RN, BSN Entered By: Baruch Gouty on 01/14/2022 08:00:18 -------------------------------------------------------------------------------- Clinic Level of Care Assessment Details Patient Name: Date of Service: [name], [name] Allen L. 01/14/2022 8:00 [place] Record Number: [id number] Patient Account Number: [id number] Date of Birth/Sex: Treating RN: 1951-04-09 (71 y.o. [name] Primary Care Jahmarion Popoff: PCP, NO Other Clinician: Referring Sylvain Hasten: Treating Pilot Prindle/Extender: Allena Katz in Treatment: 4 Clinic Level of Care  Assessment Items TOOL 4 Quantity Score []  - 0 Use when only an EandM is performed on FOLLOW-UP visit ASSESSMENTS - Nursing Assessment / Reassessment X- 1 10 Reassessment of Co-morbidities (includes updates in patient status) X- 1 5 Reassessment of Adherence to Treatment Plan ASSESSMENTS - Wound and Skin A ssessment / Reassessment X - Simple Wound Assessment / Reassessment - one wound 1 5 []  - 0 Complex Wound Assessment / Reassessment - multiple wounds X- 1 10 Dermatologic / Skin Assessment (not related to wound area) ASSESSMENTS - Focused Assessment X- 1 5 Circumferential Edema Measurements - multi extremities []  - 0 Nutritional Assessment / Counseling / Intervention X- 1 5 Lower Extremity Assessment (monofilament, tuning fork, pulses) []  - 0 Peripheral Arterial Disease Assessment (using hand held doppler) ASSESSMENTS - Ostomy and/or Continence Assessment and Care []  - 0 Incontinence Assessment and Management []  - 0 Ostomy Care Assessment and Management (repouching, etc.) PROCESS - Coordination of Care X - Simple Patient / Family Education for ongoing care 1 15 []  - 0 Complex (extensive) Patient / Family Education for ongoing care X- 1 10 Staff obtains Programmer, systems, Records, T Results / Process Orders est X- 1 10 Staff telephones HHA, Nursing Homes / Clarify orders / etc []  - 0 Routine Transfer to another Facility (non-emergent condition) []  - 0 Routine Hospital Admission (non-emergent condition) []  - 0 New Admissions / Biomedical engineer / Ordering NPWT Apligraf, etc. , []  - 0 Emergency Hospital Admission (emergent condition) X- 1 10 Simple Discharge Coordination []  - 0 Complex (extensive) Discharge Coordination PROCESS - Special Needs []  - 0 Pediatric / Minor Patient Management []  - 0 Isolation Patient Management []  - 0 Hearing / Language / Visual special needs []  - 0 Assessment of Community assistance (transportation, D/C planning, etc.) []  -  0 Additional assistance / Altered mentation []  - 0 Support Surface(s) Assessment (bed, cushion, seat, etc.) INTERVENTIONS - Wound Cleansing /  Measurement X - Simple Wound Cleansing - one wound 1 5 []  - 0 Complex Wound Cleansing - multiple wounds X- 1 5 Wound Imaging (photographs - any number of wounds) []  - 0 Wound Tracing (instead of photographs) []  - 0 Simple Wound Measurement - one wound []  - 0 Complex Wound Measurement - multiple wounds INTERVENTIONS - Wound Dressings []  - 0 Small Wound Dressing one or multiple wounds []  - 0 Medium Wound Dressing one or multiple wounds []  - 0 Large Wound Dressing one or multiple wounds []  - 0 Application of Medications - topical []  - 0 Application of Medications - injection INTERVENTIONS - Miscellaneous []  - 0 External ear exam []  - 0 Specimen Collection (cultures, biopsies, blood, body fluids, etc.) []  - 0 Specimen(s) / Culture(s) sent or taken to Lab for analysis []  - 0 Patient Transfer (multiple staff / Civil Service fast streamer / Similar devices) []  - 0 Simple Staple / Suture removal (25 or less) []  - 0 Complex Staple / Suture removal (26 or more) []  - 0 Hypo / Hyperglycemic Management (close monitor of Blood Glucose) []  - 0 Ankle / Brachial Index (ABI) - do not check if billed separately X- 1 5 Vital Signs Has the patient been seen at the hospital within the last three years: Yes Total Score: 100 Level Of Care: New/Established - Level 3 Electronic Signature(s) Signed: 01/14/2022 6:25:59 PM By: Baruch Gouty RN, BSN Entered By: Baruch Gouty on 01/14/2022 08:15:58 -------------------------------------------------------------------------------- Encounter Discharge Information Details Patient Name: Date of Service: [name], [name] Allen L. 01/14/2022 8:00 A M Medical Record Number: [id number] Patient Account Number: [id number] Date of Birth/Sex: Treating RN: 01-15-1951 (71 y.o. [name] Primary Care Saramarie Stinger: PCP, NO Other  Clinician: Referring Jovany Disano: Treating Tansy Lorek/Extender: Allena Katz in Treatment: 4 Encounter Discharge Information Items Discharge Condition: Stable Ambulatory Status: Ambulatory Discharge Destination: Home Transportation: Private Auto Accompanied By: self Schedule Follow-up Appointment: Yes Clinical Summary of Care: Patient Declined Notes facility transportation Electronic Signature(s) Signed: 01/14/2022 6:25:59 PM By: Baruch Gouty RN, BSN Entered By: Baruch Gouty on 01/14/2022 08:20:34 -------------------------------------------------------------------------------- Lower Extremity Assessment Details Patient Name: Date of Service: [name], [name] Allen L. 01/14/2022 8:00 A M Medical Record Number: [id number] Patient Account Number: [id number] Date of Birth/Sex: Treating RN: 03/12/1951 (71 y.o. [name] Primary Care Envy Meno: PCP, NO Other Clinician: Referring Christalynn Boise: Treating Carlus Stay/Extender: Marylin Crosby Weeks in Treatment: 4 Edema Assessment Assessed: [Left: No] [Right: No] Edema: [Left: Ye] [Right: s] Calf Left: Right: Point of Measurement: From Medial Instep 38 cm Ankle Left: Right: Point of Measurement: From Medial Instep 27 cm Vascular Assessment Pulses: Dorsalis Pedis Palpable: [Left:Yes] Electronic Signature(s) Signed: 01/14/2022 6:25:59 PM By: Baruch Gouty RN, BSN Entered By: Baruch Gouty on 01/14/2022 08:03:36 -------------------------------------------------------------------------------- Multi Wound Chart Details Patient Name: Date of Service: [name], [name] Allen L. 01/14/2022 8:00 A M Medical Record Number: [id number] Patient Account Number: [id number] Date of Birth/Sex: Treating RN: 11/26/1951 (71 y.o. [name] Primary Care Anastyn Ayars: PCP, NO Other Clinician: Referring Wenzel Backlund: Treating Tangee Marszalek/Extender: Allena Katz in Treatment: 4 Vital Signs Height(in):  75 Capillary Blood Glucose(mg/dl): 154 Weight(lbs): 245 Pulse(bpm): 10 Body Mass Index(BMI): 30.6 Blood Pressure(mmHg): 123/68 Temperature(F): 98.5 Respiratory Rate(breaths/min): 18 Photos: [N/A:N/A] Left, Dorsal Foot N/A N/A Wound Location: Gradually Appeared N/A N/A Wounding Event: Diabetic Wound/Ulcer of the Lower N/A N/A Primary Etiology: Extremity Lymphedema N/A N/A Secondary Etiology: Cataracts, Lymphedema, Arrhythmia, N/A N/A Comorbid History: Peripheral Venous Disease, Type II  Diabetes, Osteoarthritis, Neuropathy 12/28/2021 N/A N/A Date Acquired: 2 N/A N/A Weeks of Treatment: Open N/A N/A Wound Status: No N/A N/A Wound Recurrence: Yes N/A N/A Clustered Wound: 2 N/A N/A Clustered Quantity: 0x0x0 N/A N/A Measurements L x W x D (cm) 0 N/A N/A A (cm) : rea 0 N/A N/A Volume (cm) : 100.00% N/A N/A % Reduction in A rea: 100.00% N/A N/A % Reduction in Volume: Grade 1 N/A N/A Classification: None Present N/A N/A Exudate A mount: Flat and Intact N/A N/A Wound Margin: None Present (0%) N/A N/A Granulation A mount: None Present (0%) N/A N/A Necrotic A mount: Fascia: No N/A N/A Exposed Structures: Fat Layer (Subcutaneous Tissue): No Tendon: No Muscle: No Joint: No Bone: No Large (67-100%) N/A N/A Epithelialization: Treatment Notes Electronic Signature(s) Signed: 01/14/2022 4:50:25 PM By: Linton Ham MD Signed: 01/14/2022 6:25:59 PM By: Baruch Gouty RN, BSN Entered By: Linton Ham on 01/14/2022 08:25:51 -------------------------------------------------------------------------------- Multi-Disciplinary Care Plan Details Patient Name: Date of Service: [name], [name] Allen L. 01/14/2022 8:00 A M Medical Record Number: [id number] Patient Account Number: [id number] Date of Birth/Sex: Treating RN: 1951-05-04 (71 y.o. [name] Primary Care Kairee Kozma: PCP, NO Other Clinician: Referring Maryruth Apple: Treating Chip Canepa/Extender: Allena Katz in Treatment: 4 Multidisciplinary Care Plan reviewed with physician Active Inactive Electronic Signature(s) Signed: 01/14/2022 6:25:59 PM By: Baruch Gouty RN, BSN Entered By: Baruch Gouty on 01/14/2022 08:05:57 -------------------------------------------------------------------------------- Pain Assessment Details Patient Name: Date of Service: [name], [name] Allen L. 01/14/2022 8:00 A M Medical Record Number: [id number] Patient Account Number: [id number] Date of Birth/Sex: Treating RN: January 17, 1951 (71 y.o. [name] Primary Care Zema Lizardo: PCP, NO Other Clinician: Referring Jentri Aye: Treating Clarke Peretz/Extender: Allena Katz in Treatment: 4 Active Problems Location of Pain Severity and Description of Pain Patient Has Paino No Site Locations Rate the pain. Rate the pain. Current Pain Level: 0 Pain Management and Medication Current Pain Management: Electronic Signature(s) Signed: 01/14/2022 6:25:59 PM By: Baruch Gouty RN, BSN Entered By: Baruch Gouty on 01/14/2022 08:02:56 -------------------------------------------------------------------------------- Patient/Caregiver Education Details Patient Name: Date of Service: [name], [name] Allen Carlean Jews 2/2/2023andnbsp8:00 A M Medical Record Number: [id number] Patient Account Number: [id number] Date of Birth/Gender: Treating RN: 04-08-1951 (71 y.o. [name] Primary Care Physician: PCP, NO Other Clinician: Referring Physician: Treating Physician/Extender: Allena Katz in Treatment: 4 Education Assessment Education Provided To: Patient Education Topics Provided Venous: Methods: Explain/Verbal Responses: Reinforcements needed, State content correctly Wound/Skin Impairment: Methods: Explain/Verbal Responses: Reinforcements needed, State content correctly Electronic Signature(s) Signed: 01/14/2022 6:25:59 PM By: Baruch Gouty RN, BSN Entered By:  Baruch Gouty on 01/14/2022 43:15:40 -------------------------------------------------------------------------------- Wound Assessment Details Patient Name: Date of Service: [name], [name] Allen L. 01/14/2022 8:00 A M Medical Record Number: [id number] Patient Account Number: [id number] Date of Birth/Sex: Treating RN: 1951-05-11 (71 y.o. [name] Primary Care Karlisa Gaubert: PCP, NO Other Clinician: Referring Xin Klawitter: Treating Prospero Mahnke/Extender: Marylin Crosby Weeks in Treatment: 4 Wound Status Wound Number: 9 Primary Diabetic Wound/Ulcer of the Lower Extremity Etiology: Wound Location: Left, Dorsal Foot Secondary Lymphedema Wounding Event: Gradually Appeared Etiology: Date Acquired: 12/28/2021 Wound Open Weeks Of Treatment: 2 Status: Clustered Wound: Yes Comorbid Cataracts, Lymphedema, Arrhythmia, Peripheral Venous Disease, History: Type II Diabetes, Osteoarthritis, Neuropathy Photos Wound Measurements Length: (cm) Width: (cm) Depth: (cm) Clustered Quantity: Area: (cm) Volume: (cm) 0 % Reduction in Area: 100% 0 % Reduction in Volume: 100% 0 Epithelialization: Large (67-100%) 2 Tunneling: No 0 Undermining: No  0 Wound Description Classification: Grade 1 Wound Margin: Flat and Intact Exudate Amount: None Present Foul Odor After Cleansing: No Slough/Fibrino No Wound Bed Granulation Amount: None Present (0%) Exposed Structure Necrotic Amount: None Present (0%) Fascia Exposed: No Fat Layer (Subcutaneous Tissue) Exposed: No Tendon Exposed: No Muscle Exposed: No Joint Exposed: No Bone Exposed: No Electronic Signature(s) Signed: 01/14/2022 6:25:59 PM By: Baruch Gouty RN, BSN Entered By: Baruch Gouty on 01/14/2022 08:04:18 -------------------------------------------------------------------------------- [place] Details Patient Name: Date of Service: [name], [name] Allen L. 01/14/2022 8:00 A M Medical Record Number: [id number] Patient Account Number:  [id number] Date of Birth/Sex: Treating RN: 11-Aug-1951 (71 y.o. [name] Primary Care Tailynn Armetta: PCP, NO Other Clinician: Referring Analyse Angst: Treating Aser Nylund/Extender: Allena Katz in Treatment: 4 Vital Signs Time Taken: 08:00 Temperature (F): 98.5 Height (in): 75 Pulse (bpm): 73 Source: Stated Respiratory Rate (breaths/min): 18 Weight (lbs): 245 Blood Pressure (mmHg): 123/68 Source: Stated Capillary Blood Glucose (mg/dl): 154 Body Mass Index (BMI): 30.6 Reference Range: 80 - 120 mg / dl Notes glucose per pt report Electronic Signature(s) Signed: 01/14/2022 6:25:59 PM By: Baruch Gouty RN, BSN Entered By: Baruch Gouty on 01/14/2022 08:01:27

## 2022-01-19 ENCOUNTER — Ambulatory Visit (HOSPITAL_BASED_OUTPATIENT_CLINIC_OR_DEPARTMENT_OTHER): Payer: Medicare Other | Admitting: General Practice

## 2022-01-26 ENCOUNTER — Encounter (HOSPITAL_BASED_OUTPATIENT_CLINIC_OR_DEPARTMENT_OTHER): Payer: Medicare Other | Admitting: Internal Medicine

## 2022-01-26 ENCOUNTER — Other Ambulatory Visit: Payer: Self-pay

## 2022-01-26 DIAGNOSIS — E11621 Type 2 diabetes mellitus with foot ulcer: Secondary | ICD-10-CM | POA: Diagnosis not present

## 2022-01-26 NOTE — Progress Notes (Signed)
GRYPHON, VANDERVEEN ([id number]) Visit Report for 01/26/2022 Arrival Information Details Patient Name: Date of Service: [name] 01/26/2022 8:15 A M Medical Record Number: [id number] Patient Account Number: [id number] Date of Birth/Sex: Treating RN: 12-Jul-1951 (71 y.o. [name] Primary Care Cia Garretson: PCP, NO Other Clinician: Referring Mikel Pyon: Treating Haani Bakula/Extender: Allena Katz in Treatment: 5 Visit Information History Since Last Visit Added or deleted any medications: No Patient Arrived: Ambulatory Any new allergies or adverse reactions: No Arrival Time: 08:28 Had a fall or experienced change in No Accompanied By: self activities of daily living that may affect Transfer Assistance: None risk of falls: Patient Identification Verified: Yes Signs or symptoms of abuse/neglect since last visito No Secondary Verification Process Completed: Yes Hospitalized since last visit: No Patient Requires Transmission-Based Precautions: No Implantable device outside of the clinic excluding No Patient Has Alerts: Yes cellular tissue based products placed in the center Patient Alerts: R and L ABIs N/C since last visit: Has Dressing in Place as Prescribed: Yes Has Compression in Place as Prescribed: Yes Pain Present Now: No Electronic Signature(s) Signed: 01/26/2022 5:35:45 PM By: Baruch Gouty RN, BSN Entered By: Baruch Gouty on 01/26/2022 08:28:38 -------------------------------------------------------------------------------- Compression Therapy Details Patient Name: Date of Service: [name], [name] 01/26/2022 8:15 A M Medical Record Number: [id number] Patient Account Number: [id number] Date of Birth/Sex: Treating RN: Apr 30, 1951 (71 y.o. [name] Primary Care Kegan Shepardson: PCP, NO Other Clinician: Referring Charels Stambaugh: Treating Avery Klingbeil/Extender: Allena Katz in Treatment: 5 Compression Therapy Performed for  Wound Assessment: NonWound Condition Lymphedema - Right Leg Performed By: Clinician Baruch Gouty, RN Compression Type: Three Layer Post Procedure Diagnosis Same as Pre-procedure Electronic Signature(s) Signed: 01/26/2022 5:35:45 PM By: Baruch Gouty RN, BSN Entered By: Baruch Gouty on 01/26/2022 09:18:23 -------------------------------------------------------------------------------- Compression Therapy Details Patient Name: Date of Service: [name], [name] 01/26/2022 8:15 A M Medical Record Number: [id number] Patient Account Number: [id number] Date of Birth/Sex: Treating RN: [birth year] (71 y.o. [name] Primary Care Revecca Nachtigal: PCP, NO Other Clinician: Referring Adreanna Fickel: Treating Johneric Mcfadden/Extender: Allena Katz in Treatment: 5 Compression Therapy Performed for Wound Assessment: NonWound Condition Lymphedema - Left Leg Performed By: Clinician Baruch Gouty, RN Compression Type: Three Layer Post Procedure Diagnosis Same as Pre-procedure Electronic Signature(s) Signed: 01/26/2022 5:35:45 PM By: Baruch Gouty RN, BSN Entered By: Baruch Gouty on 01/26/2022 09:18:43 -------------------------------------------------------------------------------- Lower Extremity Assessment Details Patient Name: Date of Service: [name], [name] 01/26/2022 8:15 A M Medical Record Number: [id number] Patient Account Number: [id number] Date of Birth/Sex: Treating RN: 1951-07-02 (71 y.o. [name] Primary Care Kedarius Aloisi: PCP, NO Other Clinician: Referring Iosefa Weintraub: Treating Jakell Trusty/Extender: Marylin Crosby Weeks in Treatment: 5 Edema Assessment Assessed: [Left: No] [Right: No] Edema: [Left: Yes] [Right: Yes] Calf Left: Right: Point of Measurement: From Medial Instep 40.2 cm 49 cm Ankle Left: Right: Point of Measurement: From Medial Instep 28 cm 30 cm Vascular Assessment Pulses: Dorsalis Pedis Palpable: [Left:No]  [Right:No] Electronic Signature(s) Signed: 01/26/2022 5:35:45 PM By: Baruch Gouty RN, BSN Entered By: Baruch Gouty on 01/26/2022 08:34:57 -------------------------------------------------------------------------------- Multi Wound Chart Details Patient Name: Date of Service: [name], [name] 01/26/2022 8:15 A M Medical Record Number: [id number] Patient Account Number: [id number] Date of Birth/Sex: Treating RN: December 01, 1951 (71 y.o. [name] Primary Care Heddy Vidana: PCP, NO Other Clinician: Referring Lakia Gritton: Treating Jayra Choyce/Extender: Marylin Crosby Weeks in Treatment: 5 Vital Signs Height(in): 75 Capillary  Blood Glucose(mg/dl): 134 Weight(lbs): 245 Pulse(bpm): 60 Body Mass Index(BMI): 30.6 Blood Pressure(mmHg): 121/53 Temperature(F): 98.5 Respiratory Rate(breaths/min): 18 Wound Assessments Treatment Notes Electronic Signature(s) Signed: 01/26/2022 4:15:21 PM By: Linton Ham MD Signed: 01/26/2022 5:35:45 PM By: Baruch Gouty RN, BSN Entered By: Linton Ham on 01/26/2022 09:32:06 -------------------------------------------------------------------------------- Multi-Disciplinary Care Plan Details Patient Name: Date of Service: [name], [name] 01/26/2022 8:15 A M Medical Record Number: [id number] Patient Account Number: [id number] Date of Birth/Sex: Treating RN: 1951-05-10 (71 y.o. [name] Primary Care Kenyon Eichelberger: PCP, NO Other Clinician: Referring Lara Palinkas: Treating Ripken Rekowski/Extender: Allena Katz in Treatment: 5 Multidisciplinary Care Plan reviewed with physician Active Inactive Venous Leg Ulcer Nursing Diagnoses: Knowledge deficit related to disease process and management Potential for venous Insuffiency (use before diagnosis confirmed) Goals: Patient will maintain optimal edema control Date Initiated: 01/26/2022 Target Resolution Date: 02/23/2022 Goal Status: Active Interventions: Assess  peripheral edema status every visit. Compression as ordered Treatment Activities: Therapeutic compression applied : 01/26/2022 Notes: Electronic Signature(s) Signed: 01/26/2022 5:35:45 PM By: Baruch Gouty RN, BSN Entered By: Baruch Gouty on 01/26/2022 08:41:29 -------------------------------------------------------------------------------- Non-Wound Condition Assessment Details Patient Name: Date of Service: [name], [name] 01/26/2022 8:15 A M Medical Record Number: [id number] Patient Account Number: [id number] Date of Birth/Sex: Treating RN: 1951-10-27 (71 y.o. [name] Primary Care Tabitha Tupper: PCP, NO Other Clinician: Referring Romy Mcgue: Treating Windle Huebert/Extender: Marylin Crosby Weeks in Treatment: 5 Non-Wound Condition: Condition: Lymphedema Location: Leg Side: Right Photos Electronic Signature(s) Signed: 01/26/2022 5:35:45 PM By: Baruch Gouty RN, BSN Entered By: Baruch Gouty on 01/26/2022 08:39:08 -------------------------------------------------------------------------------- Non-Wound Condition Assessment Details Patient Name: Date of Service: [name], [name] 01/26/2022 8:15 A M Medical Record Number: [id number] Patient Account Number: [id number] Date of Birth/Sex: Treating RN: [birth year] (71 y.o. [name] Primary Care Kathyjo Briere: PCP, NO Other Clinician: Referring Maisie Hauser: Treating Shmiel Morton/Extender: Marylin Crosby Weeks in Treatment: 5 Non-Wound Condition: Condition: Lymphedema Location: Leg Side: Left Photos Electronic Signature(s) Signed: 01/26/2022 5:35:45 PM By: Baruch Gouty RN, BSN Entered By: Baruch Gouty on 01/26/2022 08:39:49 -------------------------------------------------------------------------------- Pain Assessment Details Patient Name: Date of Service: [name], [name] 01/26/2022 8:15 A M Medical Record Number: [id number] Patient Account Number: [id number] Date of  Birth/Sex: Treating RN: [birth year] (71 y.o. [name] Primary Care Kima Malenfant: PCP, NO Other Clinician: Referring Adrinne Sze: Treating Bannie Lobban/Extender: Allena Katz in Treatment: 5 Active Problems Location of Pain Severity and Description of Pain Patient Has Paino No Site Locations Rate the pain. Current Pain Level: 0 Pain Management and Medication Current Pain Management: Electronic Signature(s) Signed: 01/26/2022 5:35:45 PM By: Baruch Gouty RN, BSN Entered By: Baruch Gouty on 01/26/2022 08:29:08 -------------------------------------------------------------------------------- Patient/Caregiver Education Details Patient Name: Date of Service: [name], [name] 2/14/2023andnbsp8:15 [place] Record Number: [id number] Patient Account Number: [id number] Date of Birth/Gender: Treating RN: 11/04/1951 (71 y.o. [name] Primary Care Physician: PCP, NO Other Clinician: Referring Physician: Treating Physician/Extender: Allena Katz in Treatment: 5 Education Assessment Education Provided To: Patient Education Topics Provided Elevated Blood Sugar/ Impact on Healing: Methods: Explain/Verbal Responses: Reinforcements needed, State content correctly Venous: Methods: Explain/Verbal Responses: Reinforcements needed, State content correctly Electronic Signature(s) Signed: 01/26/2022 5:35:45 PM By: Baruch Gouty RN, BSN Entered By: Baruch Gouty on 01/26/2022 08:41:53 -------------------------------------------------------------------------------- [place] Details Patient Name: Date of Service: [name], [name] 01/26/2022 8:15 A M Medical Record Number: [id number] Patient Account Number: [id number] Date of  Birth/Sex: Treating RN: 06/04/1951 (71 y.o. [name] Primary Care Zakyia Gagan: PCP, NO Other Clinician: Referring Keahi Mccarney: Treating Tiney Zipper/Extender: Allena Katz in Treatment:  5 Vital Signs Time Taken: 08:35 Temperature (F): 98.5 Height (in): 75 Pulse (bpm): 60 Source: Stated Respiratory Rate (breaths/min): 18 Weight (lbs): 245 Blood Pressure (mmHg): 121/53 Source: Stated Capillary Blood Glucose (mg/dl): 134 Body Mass Index (BMI): 30.6 Reference Range: 80 - 120 mg / dl Notes glucose per pt report this am Electronic Signature(s) Signed: 01/26/2022 5:35:45 PM By: Baruch Gouty RN, BSN Entered By: Baruch Gouty on 01/26/2022 08:37:07

## 2022-01-26 NOTE — Progress Notes (Signed)
Allen, Reese ([id number]) Visit Report for 01/26/2022 HPI Details Patient Name: Date of Service: [name] Allen L. 01/26/2022 8:15 A M Medical Record Number: [id number] Patient Account Number: [id number] Date of Birth/Sex: Treating RN: 29-Jan-1951 (71 y.o. [name] Primary Care Provider: PCP, NO Other Clinician: Referring Provider: Treating Provider/Extender: Allena Katz in Treatment: 5 History of Present Illness HPI Description: [name] is a 71 year old male with a past medical history of type 2 diabetes and essential hypertension who presents to the clinic today for bilateral lower extremity wounds. He was recently seen in the ED on 3/9 for bilateral lower extremity edema. He had a DVT study that was negative for clots. He was given triamcinolone cream and advised to elevate legs and obtain compression stockings. He has been using the triamcinolone cream however he is unable to use compression stockings due to difficulty of putting on. Patient states that for the past 2 years he has had opening and closing of weeping wounds to his legs bilaterally. They spontaneously open and close. They are often open for several weeks before they heal spontaneously. He denies any purulent drainage, increased warmth or increased erythema to the skin. 4/21; patient presents for 1 week follow-up. The wounds on the right leg have healed however he has now 2 new wounds 1 on the anterior and posterior thigh that occurred from him scratching underneath the wrap. The left leg wound has improved and almost closed 4/28; patient presents for 1 week follow-up. He continues to have a wound on the right anterior leg that is showing signs of healing. The posterior right thigh has closed. He now has a new wound to the left leg that is limited to skin breakdown. 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 The  wounds are occurring because he goes underneath the leg wraps and scratches creating new wounds. He has no complaints today. 5/5; patient presents for 1 week follow-up. He no longer has any open wounds to his lower extremities bilaterally. He continues to have scattered excoriation marks from where he scratches. These are scabbed over. He states he is going to see dermatology this month. Overall he is doing well and is happy with his care. Readmission 6/10 Patient was followed for open wounds limited to skin breakdown to his lower extremities bilaterally 2/2 venous insufficiency. Once the wounds healed he was discharged with juxta lite compressions and these were placed in office. He was discharged on 5/5. He states that he has not taken the wraps off since discharge and has had them on for over a month. He did not want to take them off because they were very comfortable. He does not have any difficulty putting these on and off if he wanted to. He states that he does not take a shower and just cleans the other parts of his body at the sink. He noticed that 2 days ago he was having some drainage at the top of the wrap and called in to be evaluated. He states that he has not seen the wounds until the wraps were taken off today in office. He denies pain or increased warmth or erythema to the area 6/27; patient presents for 2-week follow-up. He has had increased swelling and drainage to the wounds over the last week. He reports increased erythema to the area. He does not have pain however. He denies purulent drainage or fever/chills. 7/5; patient  presents for 1 week follow-up. He was prescribed Keflex at last clinic visit and is currently taking this. He reports significant improvement to his leg swelling, tenderness and erythema. He denies signs of infection. 7/21; patient presents for 2-week follow-up. He reports improvement in healing in his right lower extremity. Unfortunately this morning he noticed  skin breakdown to his left lower extremity with increased redness and warmth. 7/28; patient presents for 1 week follow-up. He reports completing his antibiotics. He states that his wounds are healed. He has been using his juxta light compressions. He has no issues or complaints today. Readmission 12/17/2021; This is a 71 year old man who has been seen twice by Dr. Heber [place] for wounds on his bilateral lower extremities secondary to chronic venous insufficiency. Most recently this was from 05/22/2021 through 07/09/2021 with bilateral lower extremity wounds largely secondary to chronic venous insufficiency with stasis dermatitis. He has been discharged with juxta lite stockings. The patient tells me that about a month ago he developed too much edema to get his juxta lites on. He developed increased swelling. He saw his primary physician who gave him Lasix and the swelling seems to have come down. 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 He says this dates back into his adolescence and he could not walk barefoot etc. As a result of this he sometimes leaves his shoes and socks on for weeks and he is done that recently.[name] He arrives in clinic with not any wounds on his legs however with close inspection he had a fairly deep wound in the left second and third toe webspace His skin on his feet was not in good shape macerated flaking malodorous The patient's past medical history has not changed. I note that he had arterial studies done on 06/03/2021 that showed an ABI on the right of 1.26 with triphasic waveforms TBI of 0.63 on the right on the left ABI of 0.85 TBI of 0.63 again with triphasic waveforms 1/19; since the patient was last here he was seen in the hospital for bradycardia. He had his medications adjusted digoxin metoprolol. He tells me they dressed the wound between the left second and third toe. The patient says that he had open wounds on his dorsal  foot when we took off the dressing today. I was not exactly sure what he had on their. He had 2 wounds on the left dorsal foot with some significant surrounding erythema.The wound between the left second and third toe was just about closed however when we opened it to examine it today it it opened and a small area. The skin on his lower legs especially the left looks a lot better using triamcinolone and Cetaphil which she is applying He was on cephalexin prior described I think by primary care before he came here he completed this about 3 or 4 days ago 1/26; the area between the left second and third toes is healed. He still has 2 small areas on the left dorsal foot. The erythema that I marked around this last week seems better. He is completing his doxycycline. I also gave him triamcinolone and Cetaphil last week for the dry flaking irritated inflamed skin on his dorsal feet and lower legs this all seems better. He has been wearing his juxta lite stockings 2/2; everything is healed here. He is now treating his skin with I think both the triamcinolone and Cetaphil mixture that I prescribed and probably pure triamcinolone that the dermatologist  prescribed. I told him to use my compounded steroid cream as a moisture and anti-itch and inflammation cream when he is finished without to go to a pure moisturizer I emphasized that steroids under compression even if its a juxta lite stocking can get absorbed and secondarily can cause skin atrophy. I asked him not to use both. On an optimistic front he seems a lot more interested in treating his skin and watching for areas on his feet that are breaking down. His skin looks a lot better he has a juxta lite stocking 2/14; patient we discharged 2 weeks ago. He has noted increasing edema in the right greater than left leg erythema and intense pruritus. Arrives in clinic really with no open wound however considerable amount of marks on his upper right leg from  scratching. He claims compliance with his juxta lites up with up to a week ago on the right. He has been wearing 1 on the left. He has home health nurse put kerlix on this Electronic Signature(s) Signed: 01/26/2022 4:15:21 PM By: Linton Ham MD Entered By: Linton Ham on 01/26/2022 09:33:39 -------------------------------------------------------------------------------- Physical Exam Details Patient Name: Date of Service: [name], Allen Allen L. 01/26/2022 8:15 A M Medical Record Number: [id number] Patient Account Number: [id number] Date of Birth/Sex: Treating RN: 1951-03-31 (71 y.o. [name] Primary Care Provider: PCP, NO Other Clinician: Referring Provider: Treating Provider/Extender: Allena Katz in Treatment: 5 Constitutional Sitting or standing Blood Pressure is within target range for patient.. Pulse regular and within target range for patient.[name] Respirations regular, non-labored and within target range.. Temperature is normal and within the target range for the patient.[name] Appears in no distress. Cardiovascular Dorsalis pedis pulses are palpable bilaterally. Notes Wound exam; the patient does not have any open wounds however he has marked swelling of the right greater than left leg with bilateral intense erythema. 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 I also do not believe there is an acute DVT Electronic Signature(s) Signed: 01/26/2022 4:15:21 PM By: Linton Ham MD Entered By: Linton Ham on 01/26/2022 09:35:09 -------------------------------------------------------------------------------- Physician Orders Details Patient Name: Date of Service: [name], Allen Allen L. 01/26/2022 8:15 A M Medical Record Number: [id number] Patient Account Number: [id number] Date of Birth/Sex: Treating RN: [birth year] (71 y.o. [name] Primary Care Provider: PCP, NO Other Clinician: Referring Provider: Treating  Provider/Extender: Allena Katz in Treatment: 5 Verbal / Phone Orders: No Diagnosis Coding Follow-up Appointments ppointment in 1 week. - Dr. Dellia Nims - room 3 Return A Bathing/ Shower/ Hygiene May shower with protection but do not get wound dressing(s) wet. Edema Control - Lymphedema / SCD / Other Elevate legs to the level of the heart or above for 30 minutes daily and/or when sitting, a frequency of: - throughout the day Avoid standing for long periods of time. Exercise regularly Non Wound Condition Bilateral Lower Extremities pply the following to affected area as directed: - triamcinolone cream with lotion to both lower legs, bilateral 3 layer compression wraps, leave in A place for one week [facility] wound care orders this week; continue [facility] for wound care. May utilize formulary equivalent dressing for wound treatment orders unless otherwise specified. - May change wraps as needed if they slide down Other Home Health Orders/Instructions: - [facility] control surveyor Electronic Signature(s) Signed: 01/26/2022 4:15:21 PM By: Linton Ham MD Signed: 01/26/2022 5:35:45 PM By: Baruch Gouty RN, BSN Entered By: Baruch Gouty on 01/26/2022  59:16:38 -------------------------------------------------------------------------------- Problem List Details Patient Name: Date of Service: [name] Allen L. 01/26/2022 8:15 A M Medical Record Number: [id number] Patient Account Number: [id number] Date of Birth/Sex: Treating RN: 16-Nov-1951 (71 y.o. [name] Primary Care Provider: PCP, NO Other Clinician: Referring Provider: Treating Provider/Extender: Allena Katz in Treatment: 5 Active Problems ICD-10 Encounter Code Description Active Date MDM Diagnosis I87.323 Chronic venous hypertension (idiopathic) with inflammation of bilateral lower 12/17/2021 No Yes extremity Inactive Problems ICD-10 Code Description Active Date Inactive  Date E11.621 Type 2 diabetes mellitus with foot ulcer 12/17/2021 12/17/2021 L97.528 Non-pressure chronic ulcer of other part of left foot with other specified severity 12/17/2021 12/17/2021 Resolved Problems Electronic Signature(s) Signed: 01/26/2022 4:15:21 PM By: Linton Ham MD Entered By: Linton Ham on 01/26/2022 09:31:59 -------------------------------------------------------------------------------- Progress Note Details Patient Name: Date of Service: [name], Allen Allen L. 01/26/2022 8:15 A M Medical Record Number: [id number] Patient Account Number: [id number] Date of Birth/Sex: Treating RN: 10-Nov-1951 (71 y.o. [name] Primary Care Provider: PCP, NO Other Clinician: Referring Provider: Treating Provider/Extender: Allena Katz in Treatment: 5 Subjective History of Present Illness (HPI) Kiven Vangilder is a 71 year old male with a past medical history of type 2 diabetes and essential hypertension who presents to the clinic today for bilateral lower extremity wounds. He was recently seen in the ED on 3/9 for bilateral lower extremity edema. He had a DVT study that was negative for clots. He was given triamcinolone cream and advised to elevate legs and obtain compression stockings. He has been using the triamcinolone cream however he is unable to use compression stockings due to difficulty of putting on. Patient states that for the past 2 years he has had opening and closing of weeping wounds to his legs bilaterally. They spontaneously open and close. They are often open for several weeks before they heal spontaneously. He denies any purulent drainage, increased warmth or increased erythema to the skin. 4/21; patient presents for 1 week follow-up. The wounds on the right leg have healed however he has now 2 new wounds 1 on the anterior and posterior thigh that occurred from him scratching underneath the wrap. The left leg wound has improved and almost  closed 4/28; patient presents for 1 week follow-up. He continues to have a wound on the right anterior leg that is showing signs of healing. The posterior right thigh has closed. He now has a new wound to the left leg that is limited to skin breakdown. 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 The wounds are occurring because he goes underneath the leg wraps and scratches creating new wounds. He has no complaints today. 5/5; patient presents for 1 week follow-up. He no longer has any open wounds to his lower extremities bilaterally. He continues to have scattered excoriation marks from where he scratches. These are scabbed over. He states he is going to see dermatology this month. Overall he is doing well and is happy with his care. Readmission 6/10 Patient was followed for open wounds limited to skin breakdown to his lower extremities bilaterally 2/2 venous insufficiency. Once the wounds healed he was discharged with juxta lite compressions and these were placed in office. He was discharged on 5/5. He states that he has not taken the wraps off since discharge and has had them on for over a month. He did not want to take them off because they were  very comfortable. He does not have any difficulty putting these on and off if he wanted to. He states that he does not take a shower and just cleans the other parts of his body at the sink. He noticed that 2 days ago he was having some drainage at the top of the wrap and called in to be evaluated. He states that he has not seen the wounds until the wraps were taken off today in office. He denies pain or increased warmth or erythema to the area 6/27; patient presents for 2-week follow-up. He has had increased swelling and drainage to the wounds over the last week. He reports increased erythema to the area. He does not have pain however. He denies purulent drainage or fever/chills. 7/5;  patient presents for 1 week follow-up. He was prescribed Keflex at last clinic visit and is currently taking this. He reports significant improvement to his leg swelling, tenderness and erythema. He denies signs of infection. 7/21; patient presents for 2-week follow-up. He reports improvement in healing in his right lower extremity. Unfortunately this morning he noticed skin breakdown to his left lower extremity with increased redness and warmth. 7/28; patient presents for 1 week follow-up. He reports completing his antibiotics. He states that his wounds are healed. He has been using his juxta light compressions. He has no issues or complaints today. Readmission 12/17/2021; This is a 71 year old man who has been seen twice by Dr. Heber [place] for wounds on his bilateral lower extremities secondary to chronic venous insufficiency. Most recently this was from 05/22/2021 through 07/09/2021 with bilateral lower extremity wounds largely secondary to chronic venous insufficiency with stasis dermatitis. He has been discharged with juxta lite stockings. The patient tells me that about a month ago he developed too much edema to get his juxta lites on. He developed increased swelling. He saw his primary physician who gave him Lasix and the swelling seems to have come down. 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 He says this dates back into his adolescence and he could not walk barefoot etc. As a result of this he sometimes leaves his shoes and socks on for weeks and he is done that recently.[name] He arrives in clinic with not any wounds on his legs however with close inspection he had a fairly deep wound in the left second and third toe webspace His skin on his feet was not in good shape macerated flaking malodorous The patient's past medical history has not changed. I note that he had arterial studies done on 06/03/2021 that showed an ABI on the right of 1.26 with  triphasic waveforms TBI of 0.63 on the right on the left ABI of 0.85 TBI of 0.63 again with triphasic waveforms 1/19; since the patient was last here he was seen in the hospital for bradycardia. He had his medications adjusted digoxin metoprolol. He tells me they dressed the wound between the left second and third toe. The patient says that he had open wounds on his dorsal foot when we took off the dressing today. I was not exactly sure what he had on their. He had 2 wounds on the left dorsal foot with some significant surrounding erythema.The wound between the left second and third toe was just about closed however when we opened it to examine it today it it opened and a small area. The skin on his lower legs especially the left looks a lot better using triamcinolone and Cetaphil which  she is applying He was on cephalexin prior described I think by primary care before he came here he completed this about 3 or 4 days ago 1/26; the area between the left second and third toes is healed. He still has 2 small areas on the left dorsal foot. The erythema that I marked around this last week seems better. He is completing his doxycycline. I also gave him triamcinolone and Cetaphil last week for the dry flaking irritated inflamed skin on his dorsal feet and lower legs this all seems better. He has been wearing his juxta lite stockings 2/2; everything is healed here. He is now treating his skin with I think both the triamcinolone and Cetaphil mixture that I prescribed and probably pure triamcinolone that the dermatologist prescribed. I told him to use my compounded steroid cream as a moisture and anti-itch and inflammation cream when he is finished without to go to a pure moisturizer I emphasized that steroids under compression even if its a juxta lite stocking can get absorbed and secondarily can cause skin atrophy. I asked him not to use both. On an optimistic front he seems a lot more interested in treating  his skin and watching for areas on his feet that are breaking down. His skin looks a lot better he has a juxta lite stocking 2/14; patient we discharged 2 weeks ago. He has noted increasing edema in the right greater than left leg erythema and intense pruritus. Arrives in clinic really with no open wound however considerable amount of marks on his upper right leg from scratching. He claims compliance with his juxta lites up with up to a week ago on the right. He has been wearing 1 on the left. He has home health nurse put kerlix on this Objective Constitutional Sitting or standing Blood Pressure is within target range for patient.. Pulse regular and within target range for patient.[name] Respirations regular, non-labored and within target range.. Temperature is normal and within the target range for the patient.[name] Appears in no distress. Vitals Time Taken: 8:35 AM, Height: 75 in, Source: Stated, Weight: 245 lbs, Source: Stated, BMI: 30.6, Temperature: 98.5 F, Pulse: 60 bpm, Respiratory Rate: 18 breaths/min, Blood Pressure: 121/53 mmHg, Capillary Blood Glucose: 134 mg/dl. General Notes: glucose per pt report this am Cardiovascular Dorsalis pedis pulses are palpable bilaterally. General Notes: Wound exam; the patient does not have any open wounds however he has marked swelling of the right greater than left leg with bilateral intense erythema. 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 I also do not believe there is an acute DVT Assessment Active Problems ICD-10 Chronic venous hypertension (idiopathic) with inflammation of bilateral lower extremity Procedures There was a Three Layer Compression Therapy Procedure by Baruch Gouty, RN. Post procedure Diagnosis Wound #: Same as Pre-Procedure There was a Three Layer Compression Therapy Procedure by Baruch Gouty, RN. Post procedure Diagnosis Wound #: Same as Pre-Procedure Plan Follow-up Appointments: Return  Appointment in 1 week. - Dr. Dellia Nims - room 3 Bathing/ Shower/ Hygiene: May shower with protection but do not get wound dressing(s) wet. Edema Control - Lymphedema / SCD / Other: Elevate legs to the level of the heart or above for 30 minutes daily and/or when sitting, a frequency of: - throughout the day Avoid standing for long periods of time. Exercise regularly Non Wound Condition: Apply the following to affected area as directed: - triamcinolone cream with lotion to both lower legs, bilateral 3 layer compression wraps,  leave in place for one week Home Health: New wound care orders this week; continue Home Health for wound care. May utilize formulary equivalent dressing for wound treatment orders unless otherwise specified. - May change wraps as needed if they slide down Other Home Health Orders/Instructions: - [facility] 1. Liberal TCA to both legs under 3 layer compression. We are using kerlix [place] before I am not really sure why. 2. I do not think there is any issue arterially or least not a major issue 3. I wonder if there is an allergic component to something in the juxta lite stocking inner sleeve. May need to explore this next week. 4. Fortunately does not have an open wound but I think would be well on her way if we did not apply compression wraps Electronic Signature(s) Signed: 01/26/2022 4:15:21 PM By: Linton Ham MD Entered By: Linton Ham on 01/26/2022 09:36:38 -------------------------------------------------------------------------------- SuperBill Details Patient Name: Date of Service: [name], Allen Allen L. 01/26/2022 Medical Record Number: [id number] Patient Account Number: [id number] Date of Birth/Sex: Treating RN: 1951/07/28 (71 y.o. [name] Primary Care Provider: PCP, NO Other Clinician: Referring Provider: Treating Provider/Extender: Allena Katz in Treatment: 5 Diagnosis Coding ICD-10 Codes Code Description [phone] Type 2  diabetes mellitus with foot ulcer L97.528 Non-pressure chronic ulcer of other part of left foot with other specified severity I87.323 Chronic venous hypertension (idiopathic) with inflammation of bilateral lower extremity Facility Procedures CPT4: Code [id number] Description: 20 BILATERAL: Application of multi-layer venous compression system; leg (below knee), including ankle and t. Modifier: Quantity: 1 Physician Procedures : CPT4 Code Description Modifier [id number] - WC PHYS LEVEL 4 - EST PT ICD-10 Diagnosis Description E11.621 Type 2 diabetes mellitus with foot ulcer L97.528 Non-pressure chronic ulcer of other part of left foot with other specified severity I87.323  Chronic venous hypertension (idiopathic) with inflammation of bilateral lower extremity Quantity: 1 Electronic Signature(s) Signed: 01/26/2022 4:15:21 PM By: Linton Ham MD Entered By: Linton Ham on 01/26/2022 09:36:58

## 2022-02-02 ENCOUNTER — Encounter (HOSPITAL_BASED_OUTPATIENT_CLINIC_OR_DEPARTMENT_OTHER): Payer: Self-pay | Admitting: Family

## 2022-02-02 ENCOUNTER — Other Ambulatory Visit: Payer: Self-pay

## 2022-02-02 ENCOUNTER — Ambulatory Visit (INDEPENDENT_AMBULATORY_CARE_PROVIDER_SITE_OTHER): Payer: Medicare Other | Admitting: Family

## 2022-02-02 VITALS — BP 118/50 | HR 68 | Ht 75.0 in | Wt 259.0 lb

## 2022-02-02 DIAGNOSIS — I77810 Thoracic aortic ectasia: Secondary | ICD-10-CM

## 2022-02-02 DIAGNOSIS — R600 Localized edema: Secondary | ICD-10-CM

## 2022-02-02 DIAGNOSIS — D6859 Other primary thrombophilia: Secondary | ICD-10-CM

## 2022-02-02 DIAGNOSIS — I480 Paroxysmal atrial fibrillation: Secondary | ICD-10-CM | POA: Diagnosis not present

## 2022-02-02 DIAGNOSIS — I10 Essential (primary) hypertension: Secondary | ICD-10-CM

## 2022-02-02 MED ORDER — FUROSEMIDE 20 MG PO TABS: 20.0000 mg | ORAL_TABLET | ORAL | 1 refills | Status: DC | PRN

## 2022-02-02 MED ORDER — APIXABAN 5 MG PO TABS: 5.0000 mg | ORAL_TABLET | Freq: Two times a day (BID) | ORAL | 5 refills | Status: DC

## 2022-02-02 NOTE — Progress Notes (Signed)
Office Visit    Patient Name: [name] Date of Encounter: 02/02/2022  PCP:  Pcp, No   [place]  Cardiologist:  Pixie Casino, MD  Advanced Practice Provider:  No care team member to display Electrophysiologist:  None      Chief Complaint    BLUFORD Reese is a 71 y.o. male with a hx of atrial fibrillation, constipation, DM2, HTN, HLD, macrocytic anemia presents today for hospital follow up.    Past Medical History    Past Medical History:  Diagnosis Date   Atrial fibrillation ([place])    Cataract    Diabetes mellitus without complication ([place])    Hyperlipidemia    Hypertension    Lichen planus    bilateral legs   Past Surgical History:  Procedure Laterality Date   APPENDECTOMY     COLON RESECTION N/A 11/22/2016   Procedure: HAND ASSISTED LAPAROSCOPIC COLON RESECTION;  Surgeon: [name], MD;  Location: [facility];  Service: General;  Laterality: N/A;   COLONOSCOPY N/A 11/17/2016   Procedure: COLONOSCOPY;  Surgeon: [name], MD;  Location: [facility];  Service: Endoscopy;  Laterality: N/A;   FRACTURE SURGERY     MULTIPLE TOOTH EXTRACTIONS     RECTAL EXAM UNDER ANESTHESIA N/A 12/05/2016   Procedure: RECTAL EXAM UNDER ANESTHESIA, DISIMPACTION;  Surgeon: Alphonsa Overall, MD;  Location: [facility];  Service: General;  Laterality: N/A;   Allergies  Allergies  Allergen Reactions   Other     [name] bandage (self-gripping) - erythema, itching    History of Present Illness    [name] is a 71 y.o. male with a hx of atrial fibrillation, BPH, anxiety, constipation, DM2, HTN, HLD, macrocytic anemia, bifasicular block last seen while hospitalized.  He established with Dr. Debara Pickett in 2014 due to moving to the area from [place] with known diagnosis of atrial fibrillation. He has been rate controlled. Intermittently on anticoagulation due to financial limitations. He has not followed routinely and has primarily been seen during  hospitalizations.   He was most recently admitted 12/22/21 with abdominal pain, dizziness. Abdominal imaging consistent with cholelithiasis without cholecystitis. He was found to be bradycardia in the 30s with bifascicular block. Digoxin level 1.1 and discontinued. Metoprolol additionally discontinued.   Tells me "feels like a trillion dollars" since being home. Lives at ALF and shares with me that he is working on a project for two friends who have an anniversary upcoming. Reports no shortness of breath nor dyspnea on exertion. Reports no chest pain, pressure, or tightness. No edema, orthopnea, PND. Reports no palpitations.  Reviewed hospital records in detail including echo while admitted shows LVEF 60-65%, nor RWMA, RV normal size and fulction, LA midly dilated, no significant valvular abnormalities, aortic root 50mm. Following with wound clinic due to bilateral LE edema L>R with Unna boots thought to be due to severe stasis dermatitis.  EKGs/Labs/Other Studies Reviewed:   The following studies were reviewed today:  Echo 12/23/21  1. Left ventricular ejection fraction, by estimation, is 60 to 65%. The  left ventricle has normal function. The left ventricle has no regional  wall motion abnormalities. Left ventricular diastolic function could not  be evaluated.   2. Right ventricular systolic function was not well visualized. The right  ventricular size is normal. Tricuspid regurgitation signal is inadequate  for assessing PA pressure.   3. Left atrial size was mildly dilated.   4. The mitral valve is normal in  structure. No evidence of mitral valve  regurgitation. No evidence of mitral stenosis.   5. The aortic valve is normal in structure. Aortic valve regurgitation is  not visualized. No aortic stenosis is present.   6. Aortic dilatation noted. Aneurysm of the aortic root, measuring 47 mm.   7. The inferior vena cava is dilated in size with <50% respiratory  variability, suggesting right  atrial pressure of 15 mmHg.   EKG:  No EKG today.   Recent Labs: 12/23/2021: ALT 13; BUN 17; Creatinine, Ser 1.04; Hemoglobin 9.6; Platelets 156; Potassium 4.1; Sodium 133  Recent Lipid Panel    Component Value Date/Time   CHOL 134 04/26/2014 0830   TRIG 110 04/26/2014 0830   HDL 51 04/26/2014 0830   CHOLHDL 2.6 04/26/2014 0830   VLDL 22 04/26/2014 0830   LDLCALC 61 04/26/2014 0830    Risk Assessment/Calculations:   CHA2DS2-VASc Score = 4  This indicates a 4.8% annual risk of stroke. The patient's score is based upon: CHF History: 0 HTN History: 1 Diabetes History: 1 Stroke History: 0 Vascular Disease History: 1 Age Score: 1 Gender Score: 0   Home Medications   Current Meds  Medication Sig   atorvastatin (LIPITOR) 20 MG tablet Take 20 mg by mouth daily.   carbamide peroxide (DEBROX) 6.5 % OTIC solution Place 5 drops into the right ear 2 (two) times daily.   dextrose (GLUTOSE) 40 % GEL Take 1 Tube by mouth as needed for low blood sugar.   docusate sodium (COLACE) 100 MG capsule Take 1 capsule (100 mg total) by mouth 2 (two) times daily.   folic acid (FOLVITE) 1 MG tablet Take 1 mg by mouth See admin instructions. 1mg  everyday EXCEPT none on Fridays.   furosemide (LASIX) 20 MG tablet Take 1 tablet (20 mg total) by mouth as needed for edema or fluid (If taking more than twice per week, please call cardiology office.).   glipiZIDE (GLUCOTROL) 10 MG tablet Take 10 mg by mouth 2 (two) times daily before a meal.   hydrOXYzine (ATARAX/VISTARIL) 25 MG tablet Take 1 tablet (25 mg total) by mouth every 8 (eight) hours as needed for itching. Use caution as this medication may make you drowsy   JANUVIA 100 MG tablet Take 100 mg by mouth daily.   metFORMIN (GLUCOPHAGE) 1000 MG tablet Take 1,000 mg by mouth 2 (two) times daily with a meal.   methotrexate (RHEUMATREX) 2.5 MG tablet Take 10 mg by mouth once a week. Takes on Fridays.   miconazole (MICOTIN) 2 % powder Apply 1 application  topically See admin instructions. 1 application topical twice daily And 1 application daily to groin, stomach, thighs, buttocks area for chafing.   pantoprazole (PROTONIX) 40 MG tablet Take 1 tablet (40 mg total) by mouth daily.   polyethylene glycol (MIRALAX) packet Take 17 g by mouth daily. Hold for diarrhea   PRESCRIPTION MEDICATION Apply 1 application topically daily. Tac 0.1% Cetaphil 1:4 Apply to lower legs   tamsulosin (FLOMAX) 0.4 MG CAPS capsule Take 1 capsule (0.4 mg total) by mouth daily.   triamcinolone (KENALOG) 0.1 % Apply 1 application topically See admin instructions. 1 application topical twice daily to itchy areas And 1 extra application daily as needed for dermatitis   [DISCONTINUED] apixaban (ELIQUIS) 5 MG TABS tablet Take 1 tablet (5 mg total) by mouth 2 (two) times daily.     Review of Systems      All other systems reviewed and are otherwise negative except as  noted above.  Physical Exam    VS:  BP (!) 118/50    Pulse 68    Ht 6\' 3"  (1.905 m)    Wt 259 lb (117.5 kg)    SpO2 98%    BMI 32.37 kg/m  , BMI Body mass index is 32.37 kg/m.  Wt Readings from Last 3 Encounters:  02/02/22 259 lb (117.5 kg)  12/22/21 254 lb 11.2 oz (115.5 kg)  07/17/20 264 lb (119.7 kg)    GEN: Well nourished, well developed, in no acute distress. HEENT: normal. Neck: Supple, no JVD, carotid bruits, or masses. Cardiac:  IRIR, no murmurs, rubs, or gallops. No clubbing, cyanosis, edema.  Radials/PT 2+ and equal bilaterally.  Respiratory:  Respirations regular and unlabored, clear to auscultation bilaterally. GI: Soft, nontender, nondistended. MS: No deformity or atrophy. Skin: Warm and dry, no rash. Neuro:  Strength and sensation are intact. Psych: Normal affect.  Assessment & Plan    Atrial fibrillation / Bradycardia / Bifascicular block (RBBB/LAFB) - No recurrent bradycardia. Asymptomatic bifascicular block with no lightheadedness, near syncope, syncope. Continue to monitor with  periodic EKG. Digoxin and Metoprolol discontinued during recent admission - avoid AV nodal blocking agents. Continue rate control strategy as he is asymptomatic in regards to his atrial fibrillation.   Aortic root dilation - 80mm by echo 12/2021. Repeat echo in 1 year. Continue BP control and Atorvastatin.   Chronic anticoagulation -Denies bleeding complications. CHA2DS2-VASc Score = 4 [CHF History: 0, HTN History: 1, Diabetes History: 1, Stroke History: 0, Vascular Disease History: 1, Age Score: 1, Gender Score: 0].  Therefore, the patient's annual risk of stroke is 4.8 %.    Continue Eliquis 5mg  BID - refills provided.  HTN - BP well controlled. Not requiring antihypertensive agents.   HLD - Continue Atorvatatin. Denies myalgias.   DM2 - 12/22/21 A1c 5.5. Continue to follow with PCP.   LE edema - Prior diagnosis of lymphedema. Follows with wound clinic. Recent echo normal LVEF. Likely some diastolic dysfunction given atrial fibrillation. Will Rx Lasix 20mg  PRN for edema. Recommend he take for the next two days. He will contact our office if taking more than twice per week.    Disposition: Follow up in 4 month(s) with Pixie Casino, MD or APP.  Signed, Loel Dubonnet, NP 02/02/2022, 12:54 PM [place]

## 2022-02-02 NOTE — Patient Instructions (Addendum)
Medication Instructions:  Your physician has recommended you make the following change in your medication:   START Furosemide 20mg  tablet as needed for fluid and swelling *Please take one tablet for 3 days then as needed. If taking more than twice per week, please call our office.   We will avoid Digoxin and Metoprolol as they can make your heart rate too low.   *If you need a refill on your cardiac medications before your next appointment, please call your pharmacy*  Lab Work: None ordered today.   Testing/Procedures:  Your echocardiogram (ultrasound of your heart) in the hospital showed your heart muscle was working well. Your aortic root was mildly dilated which we monitor by doing an echocardiogram in one year. We prevent it from getting worse by keeping your blood pressure well controlled.   Follow-Up: At [facility], you and your health needs are our priority.  As part of our continuing mission to provide you with exceptional heart care, we have created designated Provider Care Teams.  These Care Teams include your primary Cardiologist (physician) and Advanced Practice Providers (APPs -  Physician Assistants and Nurse Practitioners) who all work together to provide you with the care you need, when you need it.  We recommend signing up for the patient portal called "MyChart".  Sign up information is provided on this After Visit Summary.  MyChart is used to connect with patients for Virtual Visits (Telemedicine).  Patients are able to view lab/test results, encounter notes, upcoming appointments, etc.  Non-urgent messages can be sent to your provider as well.   To learn more about what you can do with MyChart, go to [email or web address].    Your next appointment:   4 month(s)  The format for your next appointment:   In Person  Provider:   K. Mali Hilty, MD or Loel Dubonnet, NP     Other Instructions   Our goal is for your heart rate when you're resting to be between  55bpm and 99bpm. Please let your facility know if your heart rate is consistently less than 55 bpm or more than 99 bpm.   To prevent or reduce lower extremity swelling: Eat a low salt diet. Salt makes the body hold onto extra fluid which causes swelling. Sit with legs elevated. For example, in the recliner or on an [place].  Wear knee-high compression stockings during the daytime. Ones labeled 15-20 mmHg provide good compression.

## 2022-02-03 ENCOUNTER — Encounter (HOSPITAL_BASED_OUTPATIENT_CLINIC_OR_DEPARTMENT_OTHER): Payer: Medicare Other | Admitting: General Surgery

## 2022-02-03 DIAGNOSIS — E11621 Type 2 diabetes mellitus with foot ulcer: Secondary | ICD-10-CM | POA: Diagnosis not present

## 2022-02-03 NOTE — Progress Notes (Signed)
CLARICE, BONAVENTURE ([id number]) Visit Report for 02/03/2022 Chief Complaint Document Details Patient Name: Date of Service: [name] 02/03/2022 12:30 PM Medical Record Number: [id number] Patient Account Number: [id number] Date of Birth/Sex: Treating RN: 02-Apr-1951 (71 y.o. [name] Primary Care Provider: PCP, NO Other Clinician: Referring Provider: Treating Provider/Extender: Pedro Earls in Treatment: 6 Information Obtained from: Patient Chief Complaint 02/03/2022: The patient is here today for reevaluation of bilateral lower extremity edema and venous stasis dermatitis Electronic Signature(s) Signed: 02/03/2022 1:14:47 PM By: Fredirick Maudlin MD FACS Entered By: Fredirick Maudlin on 02/03/2022 13:14:47 -------------------------------------------------------------------------------- HPI Details Patient Name: Date of Service: [name], Allen [name] 02/03/2022 12:30 PM Medical Record Number: [id number] Patient Account Number: [id number] Date of Birth/Sex: Treating RN: 30-Jun-1951 (71 y.o. [name] Primary Care Provider: PCP, NO Other Clinician: Referring Provider: Treating Provider/Extender: Luiz Iron Weeks in Treatment: 6 History of Present Illness HPI Description: [name] is a 71 year old male with a past medical history of type 2 diabetes and essential hypertension who presents to the clinic today for bilateral lower extremity wounds. He was recently seen in the ED on 3/9 for bilateral lower extremity edema. He had a DVT study that was negative for clots. He was given triamcinolone cream and advised to elevate legs and obtain compression stockings. He has been using the triamcinolone cream however he is unable to use compression stockings due to difficulty of putting on. Patient states that for the past 2 years he has had opening and closing of weeping wounds to his legs bilaterally. They spontaneously open and  close. They are often open for several weeks before they heal spontaneously. He denies any purulent drainage, increased warmth or increased erythema to the skin. 4/21; patient presents for 1 week follow-up. The wounds on the right leg have healed however he has now 2 new wounds 1 on the anterior and posterior thigh that occurred from him scratching underneath the wrap. The left leg wound has improved and almost closed 4/28; patient presents for 1 week follow-up. He continues to have a wound on the right anterior leg that is showing signs of healing. The posterior right thigh has closed. He now has a new wound to the left leg that is limited to skin breakdown. 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 The wounds are occurring because he goes underneath the leg wraps and scratches creating new wounds. He has no complaints today. 5/5; patient presents for 1 week follow-up. He no longer has any open wounds to his lower extremities bilaterally. He continues to have scattered excoriation marks from where he scratches. These are scabbed over. He states he is going to see dermatology this month. Overall he is doing well and is happy with his care. Readmission 6/10 Patient was followed for open wounds limited to skin breakdown to his lower extremities bilaterally 2/2 venous insufficiency. Once the wounds healed he was discharged with juxta lite compressions and these were placed in office. He was discharged on 5/5. He states that he has not taken the wraps off since discharge and has had them on for over a month. He did not want to take them off because they were very comfortable. He does not have any difficulty putting these on and off if he wanted to. He states that he does not take a shower and just cleans the other parts of his  body at the sink. He noticed that 2 days ago he was having some drainage at the top of the wrap and called  in to be evaluated. He states that he has not seen the wounds until the wraps were taken off today in office. He denies pain or increased warmth or erythema to the area 6/27; patient presents for 2-week follow-up. He has had increased swelling and drainage to the wounds over the last week. He reports increased erythema to the area. He does not have pain however. He denies purulent drainage or fever/chills. 7/5; patient presents for 1 week follow-up. He was prescribed Keflex at last clinic visit and is currently taking this. He reports significant improvement to his leg swelling, tenderness and erythema. He denies signs of infection. 7/21; patient presents for 2-week follow-up. He reports improvement in healing in his right lower extremity. Unfortunately this morning he noticed skin breakdown to his left lower extremity with increased redness and warmth. 7/28; patient presents for 1 week follow-up. He reports completing his antibiotics. He states that his wounds are healed. He has been using his juxta light compressions. He has no issues or complaints today. Readmission 12/17/2021; This is a 71 year old man who has been seen twice by Dr. Heber Reese for wounds on his bilateral lower extremities secondary to chronic venous insufficiency. Most recently this was from 05/22/2021 through 07/09/2021 with bilateral lower extremity wounds largely secondary to chronic venous insufficiency with stasis dermatitis. He has been discharged with juxta lite stockings. The patient tells me that about a month ago he developed too much edema to get his juxta lites on. He developed increased swelling. He saw his primary physician who gave him Lasix and the swelling seems to have come down. 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 He says this dates back into his adolescence and he could not walk barefoot etc. As a result of this he sometimes leaves his shoes and socks on for  weeks and he is done that recently.[name] He arrives in clinic with not any wounds on his legs however with close inspection he had a fairly deep wound in the left second and third toe webspace His skin on his feet was not in good shape macerated flaking malodorous The patient's past medical history has not changed. I note that he had arterial studies done on 06/03/2021 that showed an ABI on the right of 1.26 with triphasic waveforms TBI of 0.63 on the right on the left ABI of 0.85 TBI of 0.63 again with triphasic waveforms 1/19; since the patient was last here he was seen in the hospital for bradycardia. He had his medications adjusted digoxin metoprolol. He tells me they dressed the wound between the left second and third toe. The patient says that he had open wounds on his dorsal foot when we took off the dressing today. I was not exactly sure what he had on their. He had 2 wounds on the left dorsal foot with some significant surrounding erythema.The wound between the left second and third toe was just about closed however when we opened it to examine it today it it opened and a small area. The skin on his lower legs especially the left looks a lot better using triamcinolone and Cetaphil which she is applying He was on cephalexin prior described I think by primary care before he came here he completed this about 3 or 4 days ago 1/26; the area between the left second  and third toes is healed. He still has 2 small areas on the left dorsal foot. The erythema that I marked around this last week seems better. He is completing his doxycycline. I also gave him triamcinolone and Cetaphil last week for the dry flaking irritated inflamed skin on his dorsal feet and lower legs this all seems better. He has been wearing his juxta lite stockings 2/2; everything is healed here. He is now treating his skin with I think both the triamcinolone and Cetaphil mixture that I prescribed and probably pure triamcinolone that  the dermatologist prescribed. I told him to use my compounded steroid cream as a moisture and anti-itch and inflammation cream when he is finished without to go to a pure moisturizer I emphasized that steroids under compression even if its a juxta lite stocking can get absorbed and secondarily can cause skin atrophy. I asked him not to use both. On an optimistic front he seems a lot more interested in treating his skin and watching for areas on his feet that are breaking down. His skin looks a lot better he has a juxta lite stocking 2/14; patient we discharged 2 weeks ago. He has noted increasing edema in the right greater than left leg erythema and intense pruritus. Arrives in clinic really with no open wound however considerable amount of marks on his upper right leg from scratching. He claims compliance with his juxta lites up with up to a week ago on the right. He has been wearing 1 on the left. He has home health nurse put kerlix on this 02/03/2022: No open wounds. He has been in 3 layer compression, which is slipped down and caused some "mushrooming" affect from just below the tibial tuberosity to the knee. The excoriations on his legs have resolved. He reports being compliant with moisturizing and elevation of his legs. He saw his cardiologist yesterday and was prescribed 20 mg of Lasix to be used on an as-needed basis. Electronic Signature(s) Signed: 02/03/2022 1:17:40 PM By: Fredirick Maudlin MD FACS Previous Signature: 02/03/2022 1:16:39 PM Version By: Fredirick Maudlin MD FACS Entered By: Fredirick Maudlin on 02/03/2022 13:17:39 -------------------------------------------------------------------------------- Physical Exam Details Patient Name: Date of Service: [name], Allen [name] 02/03/2022 12:30 PM Medical Record Number: [id number] Patient Account Number: [id number] Date of Birth/Sex: Treating RN: 06/11/1951 (71 y.o. [name] Primary Care Provider: PCP, NO Other  Clinician: Referring Provider: Treating Provider/Extender: Luiz Iron Weeks in Treatment: 6 Constitutional . . No acute distress. Respiratory Normal work of breathing on room air. Notes 02/03/2022: There are no open wounds present. There is evidence that he has been wearing his compression wraps appropriately with significant improvement of the skin in both legs. The excoriations have resolved. Electronic Signature(s) Signed: 02/03/2022 1:20:03 PM By: Fredirick Maudlin MD FACS Entered By: Fredirick Maudlin on 02/03/2022 13:20:03 -------------------------------------------------------------------------------- Physician Orders Details Patient Name: Date of Service: [name], Allen [name] 02/03/2022 12:30 PM Medical Record Number: [id number] Patient Account Number: [id number] Date of Birth/Sex: Treating RN: 12-25-1950 (71 y.o. [name] Primary Care Provider: PCP, NO Other Clinician: Referring Provider: Treating Provider/Extender: Luiz Iron Weeks in Treatment: 6 Verbal / Phone Orders: No Diagnosis Coding ICD-10 Coding Code Description I87.323 Chronic venous hypertension (idiopathic) with inflammation of bilateral lower extremity Discharge From [facility] Services Discharge from [place] Bathing/ Shower/ Hygiene May shower and wash wound with soap and water. Edema Control - Lymphedema / SCD / Other Elevate legs to the level of  the heart or above for 30 minutes daily and/or when sitting, a frequency of: - throughout the day Avoid standing for long periods of time. Exercise regularly Moisturize legs daily. - every evening after removing stockings Home Health Discontinue home health for wound care. Other Home Health Orders/Instructions: - [employer] Electronic Signature(s) Signed: 02/03/2022 1:26:06 PM By: Fredirick Maudlin MD FACS Entered By: Fredirick Maudlin on 02/03/2022  13:22:58 -------------------------------------------------------------------------------- Problem List Details Patient Name: Date of Service: [name], Allen [name] 02/03/2022 12:30 PM Medical Record Number: [id number] Patient Account Number: [id number] Date of Birth/Sex: Treating RN: 16-Mar-1951 (71 y.o. [name] Primary Care Provider: PCP, NO Other Clinician: Referring Provider: Treating Provider/Extender: Luiz Iron Weeks in Treatment: 6 Active Problems ICD-10 Encounter Encounter Code Description Active Date MDM Diagnosis I87.323 Chronic venous hypertension (idiopathic) with inflammation of bilateral lower 12/17/2021 No Yes extremity Inactive Problems ICD-10 Code Description Active Date Inactive Date E11.621 Type 2 diabetes mellitus with foot ulcer 12/17/2021 12/17/2021 L97.528 Non-pressure chronic ulcer of other part of left foot with other specified severity 12/17/2021 12/17/2021 Resolved Problems Electronic Signature(s) Signed: 02/03/2022 1:11:55 PM By: Fredirick Maudlin MD FACS Entered By: Fredirick Maudlin on 02/03/2022 13:11:55 -------------------------------------------------------------------------------- Progress Note Details Patient Name: Date of Service: [name], Allen [name] 02/03/2022 12:30 PM Medical Record Number: [id number] Patient Account Number: [id number] Date of Birth/Sex: Treating RN: 08-04-1951 (71 y.o. [name] Primary Care Provider: PCP, NO Other Clinician: Referring Provider: Treating Provider/Extender: Pedro Earls in Treatment: 6 Subjective Chief Complaint Information obtained from Patient 02/03/2022: The patient is here today for reevaluation of bilateral lower extremity edema and venous stasis dermatitis History of Present Illness (HPI) [name] is a 71 year old male with a past medical history of type 2 diabetes and essential hypertension who presents to the clinic today for bilateral lower  extremity wounds. He was recently seen in the ED on 3/9 for bilateral lower extremity edema. He had a DVT study that was negative for clots. He was given triamcinolone cream and advised to elevate legs and obtain compression stockings. He has been using the triamcinolone cream however he is unable to use compression stockings due to difficulty of putting on. Patient states that for the past 2 years he has had opening and closing of weeping wounds to his legs bilaterally. They spontaneously open and close. They are often open for several weeks before they heal spontaneously. He denies any purulent drainage, increased warmth or increased erythema to the skin. 4/21; patient presents for 1 week follow-up. The wounds on the right leg have healed however he has now 2 new wounds 1 on the anterior and posterior thigh that occurred from him scratching underneath the wrap. The left leg wound has improved and almost closed 4/28; patient presents for 1 week follow-up. He continues to have a wound on the right anterior leg that is showing signs of healing. The posterior right thigh has closed. He now has a new wound to the left leg that is limited to skin breakdown. 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 The wounds are occurring because he goes underneath the leg wraps and scratches creating new wounds. He has no complaints today. 5/5; patient presents for 1 week follow-up. He no longer has any open wounds to his lower extremities bilaterally. He continues to have scattered excoriation marks from where he scratches. These are scabbed over. He states he is going  to see dermatology this month. Overall he is doing well and is happy with his care. Readmission 6/10 Patient was followed for open wounds limited to skin breakdown to his lower extremities bilaterally 2/2 venous insufficiency. Once the wounds healed he was discharged with juxta  lite compressions and these were placed in office. He was discharged on 5/5. He states that he has not taken the wraps off since discharge and has had them on for over a month. He did not want to take them off because they were very comfortable. He does not have any difficulty putting these on and off if he wanted to. He states that he does not take a shower and just cleans the other parts of his body at the sink. He noticed that 2 days ago he was having some drainage at the top of the wrap and called in to be evaluated. He states that he has not seen the wounds until the wraps were taken off today in office. He denies pain or increased warmth or erythema to the area 6/27; patient presents for 2-week follow-up. He has had increased swelling and drainage to the wounds over the last week. He reports increased erythema to the area. He does not have pain however. He denies purulent drainage or fever/chills. 7/5; patient presents for 1 week follow-up. He was prescribed Keflex at last clinic visit and is currently taking this. He reports significant improvement to his leg swelling, tenderness and erythema. He denies signs of infection. 7/21; patient presents for 2-week follow-up. He reports improvement in healing in his right lower extremity. Unfortunately this morning he noticed skin breakdown to his left lower extremity with increased redness and warmth. 7/28; patient presents for 1 week follow-up. He reports completing his antibiotics. He states that his wounds are healed. He has been using his juxta light compressions. He has no issues or complaints today. Readmission 12/17/2021; This is a 71 year old man who has been seen twice by Dr. Heber Bazile Reese for wounds on his bilateral lower extremities secondary to chronic venous insufficiency. Most recently this was from 05/22/2021 through 07/09/2021 with bilateral lower extremity wounds largely secondary to chronic venous insufficiency with stasis dermatitis. He has  been discharged with juxta lite stockings. The patient tells me that about a month ago he developed too much edema to get his juxta lites on. He developed increased swelling. He saw his primary physician who gave him Lasix and the swelling seems to have come down. 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 He says this dates back into his adolescence and he could not walk barefoot etc. As a result of this he sometimes leaves his shoes and socks on for weeks and he is done that recently.[name] He arrives in clinic with not any wounds on his legs however with close inspection he had a fairly deep wound in the left second and third toe webspace His skin on his feet was not in good shape macerated flaking malodorous The patient's past medical history has not changed. I note that he had arterial studies done on 06/03/2021 that showed an ABI on the right of 1.26 with triphasic waveforms TBI of 0.63 on the right on the left ABI of 0.85 TBI of 0.63 again with triphasic waveforms 1/19; since the patient was last here he was seen in the hospital for bradycardia. He had his medications adjusted digoxin metoprolol. He tells me they dressed the wound between the left second and third  toe. The patient says that he had open wounds on his dorsal foot when we took off the dressing today. I was not exactly sure what he had on their. He had 2 wounds on the left dorsal foot with some significant surrounding erythema.The wound between the left second and third toe was just about closed however when we opened it to examine it today it it opened and a small area. The skin on his lower legs especially the left looks a lot better using triamcinolone and Cetaphil which she is applying He was on cephalexin prior described I think by primary care before he came here he completed this about 3 or 4 days ago 1/26; the area between the left second and third toes is healed. He still has 2  small areas on the left dorsal foot. The erythema that I marked around this last week seems better. He is completing his doxycycline. I also gave him triamcinolone and Cetaphil last week for the dry flaking irritated inflamed skin on his dorsal feet and lower legs this all seems better. He has been wearing his juxta lite stockings 2/2; everything is healed here. He is now treating his skin with I think both the triamcinolone and Cetaphil mixture that I prescribed and probably pure triamcinolone that the dermatologist prescribed. I told him to use my compounded steroid cream as a moisture and anti-itch and inflammation cream when he is finished without to go to a pure moisturizer I emphasized that steroids under compression even if its a juxta lite stocking can get absorbed and secondarily can cause skin atrophy. I asked him not to use both. On an optimistic front he seems a lot more interested in treating his skin and watching for areas on his feet that are breaking down. His skin looks a lot better he has a juxta lite stocking 2/14; patient we discharged 2 weeks ago. He has noted increasing edema in the right greater than left leg erythema and intense pruritus. Arrives in clinic really with no open wound however considerable amount of marks on his upper right leg from scratching. He claims compliance with his juxta lites up with up to a week ago on the right. He has been wearing 1 on the left. He has home health nurse put kerlix on this 02/03/2022: No open wounds. He has been in 3 layer compression, which is slipped down and caused some "mushrooming" affect from just below the tibial tuberosity to the knee. The excoriations on his legs have resolved. He reports being compliant with moisturizing and elevation of his legs. He saw his cardiologist yesterday and was prescribed 20 mg of Lasix to be used on an as-needed basis. Patient History Unable to Obtain Patient History due to Altered Mental  Status. Information obtained from Patient. Family History Cancer - Mother,Maternal Grandparents, Heart Disease - Maternal Grandparents, Hypertension - Maternal Grandparents, No family history of Diabetes, Hereditary Spherocytosis. Social History Former smoker - ended on 09/14/1983, Marital Status - Single, Alcohol Use - Rarely, Drug Use - No History, Caffeine Use - Never. Medical History Eyes Patient has history of Cataracts - 2014 Hematologic/Lymphatic Patient has history of Lymphedema Cardiovascular Patient has history of Arrhythmia - A-Fib, Peripheral Venous Disease Endocrine Patient has history of Type II Diabetes Musculoskeletal Patient has history of Osteoarthritis Neurologic Patient has history of Neuropathy Denies history of Quadriplegia, Paraplegia, Seizure Disorder Oncologic Denies history of Received Chemotherapy, Received Radiation Psychiatric Denies history of Anorexia/bulimia, Confinement Anxiety Hospitalization/Surgery History - colon resection. - appendectomy. -  multiple tooth extraction. Medical A Surgical History Notes nd Hematologic/Lymphatic Hyperbilirubinemia Gastrointestinal Rectal Pain-Constipation Genitourinary CKD stage 3-BPH Oncologic Colon Cancer-Colon Re-section Objective Constitutional No acute distress. Vitals Time Taken: 12:43 PM, Height: 75 in, Weight: 245 lbs, BMI: 30.6, Temperature: 98.9 F, Pulse: 73 bpm, Respiratory Rate: 18 breaths/min, Blood Pressure: 118/68 mmHg, Capillary Blood Glucose: 74 mg/dl. Respiratory Normal work of breathing on room air. General Notes: 02/03/2022: There are no open wounds present. There is evidence that he has been wearing his compression wraps appropriately with significant improvement of the skin in both legs. The excoriations have resolved. Assessment Active Problems ICD-10 Chronic venous hypertension (idiopathic) with inflammation of bilateral lower extremity Plan [facility]charge From [facility]  Services: Discharge from [place] Bathing/ Shower/ Hygiene: May shower and wash wound with soap and water. Edema Control - Lymphedema / SCD / Other: Elevate legs to the level of the heart or above for 30 minutes daily and/or when sitting, a frequency of: - throughout the day Avoid standing for long periods of time. Exercise regularly Moisturize legs daily. - every evening after removing stockings Home Health: Discontinue home health for wound care. Other Home Health Orders/Instructions: - [facility] 02/03/2022: At this time, the patient seems to be compliant with his recommended compression therapy. He has no active wounds. I think his cardiologist's prescription of Lasix is very justifiable and I think he will likely need to maintain this therapy on a scheduled basis, rather than as needed. I have encouraged him to reach out to his cardiologist to confirm this. At this point, I think we can return him to using his juxta lite stockings on a daily basis. He was reminded to remove them at night and wash and moisturize his legs. We reiterated to him the importance of keeping his legs elevated as much as possible and avoiding standing or other dependent positions for prolonged periods throughout the day. We will see him on an as-needed basis, discharging him from clinic today. Electronic Signature(s) Signed: 02/03/2022 1:25:00 PM By: Fredirick Maudlin MD FACS Entered By: Fredirick Maudlin on 02/03/2022 13:24:59 -------------------------------------------------------------------------------- HxROS Details Patient Name: Date of Service: [name], Allen [name] 02/03/2022 12:30 PM Medical Record Number: [id number] Patient Account Number: [id number] Date of Birth/Sex: Treating RN: [birth year] (71 y.o. [name] Primary Care Provider: PCP, NO Other Clinician: Referring Provider: Treating Provider/Extender: Luiz Iron Weeks in Treatment: 6 Unable to Obtain Patient  History due to Altered Mental Status Information Obtained From Patient Eyes Medical History: Positive for: Cataracts - 2014 Hematologic/Lymphatic Medical History: Positive for: Lymphedema Past Medical History Notes: Hyperbilirubinemia Cardiovascular Medical History: Positive for: Arrhythmia - A-Fib; Peripheral Venous Disease Gastrointestinal Medical History: Past Medical History Notes: Rectal Pain-Constipation Endocrine Medical History: Positive for: Type II Diabetes Time with diabetes: Since 2014 Treated with: Oral agents Blood sugar tested every day: Yes Tested : Genitourinary Medical History: Past Medical History Notes: CKD stage 3-BPH Musculoskeletal Medical History: Positive for: Osteoarthritis Neurologic Medical History: Positive for: Neuropathy Negative for: Quadriplegia; Paraplegia; Seizure Disorder Oncologic Medical History: Negative for: Received Chemotherapy; Received Radiation Past Medical History Notes: Colon Cancer-Colon Re-section Psychiatric Medical History: Negative for: Anorexia/bulimia; Confinement Anxiety HBO Extended History Items Eyes: Cataracts Immunizations Pneumococcal Vaccine: Received Pneumococcal Vaccination: No Implantable Devices None Hospitalization / Surgery History Type of Hospitalization/Surgery colon resection appendectomy multiple tooth extraction Family and Social History Cancer: Yes - Mother,Maternal Grandparents; Diabetes: No; Heart Disease: Yes - Maternal Grandparents; Hereditary Spherocytosis: No; Hypertension: Yes - Maternal Grandparents; Former smoker - ended  on 09/14/1983; Marital Status - Single; Alcohol Use: Rarely; Drug Use: No History; Caffeine Use: Never; Financial Concerns: No; Food, Clothing or Shelter Needs: No; Support System Lacking: No; Transportation Concerns: No Physician Affirmation I have reviewed and agree with the above information. Electronic Signature(s) Signed: 02/03/2022 1:26:06 PM By:  Fredirick Maudlin MD FACS Signed: 02/03/2022 5:17:37 PM By: Baruch Gouty RN, BSN Entered By: Fredirick Maudlin on 02/03/2022 13:17:50 -------------------------------------------------------------------------------- SuperBill Details Patient Name: Date of Service: [name], Allen [name] 02/03/2022 Medical Record Number: [id number] Patient Account Number: [id number] Date of Birth/Sex: Treating RN: 1951/02/20 (71 y.o. [name] Primary Care Provider: PCP, NO Other Clinician: Referring Provider: Treating Provider/Extender: Luiz Iron Weeks in Treatment: 6 Diagnosis Coding ICD-10 Codes Code Description E11.621 Type 2 diabetes mellitus with foot ulcer L97.528 Non-pressure chronic ulcer of other part of left foot with other specified severity I87.323 Chronic venous hypertension (idiopathic) with inflammation of bilateral lower extremity Facility Procedures CPT4 Code: 46286381 Description: 77116 - WOUND CARE VISIT-LEV 3 EST PT Modifier: Quantity: 1 Physician Procedures Electronic Signature(s) Signed: 02/03/2022 1:25:26 PM By: Fredirick Maudlin MD FACS Entered By: Fredirick Maudlin on 02/03/2022 13:25:26

## 2022-02-04 NOTE — Progress Notes (Signed)
[name], [name] ([id number]) Visit Report for 02/03/2022 Arrival Information Details Patient Name: Date of Service: [name] 02/03/2022 12:30 PM Medical Record Number: [id number] Patient Account Number: [id number] Date of Birth/Sex: Treating RN: Jun 26, 1951 (71 y.o. [name] Primary Care [name]: PCP, NO Other Clinician: Referring [name]: Treating [name]/Extender: [name] in Treatment: 6 Visit Information History Since Last Visit Added or deleted any medications: No Patient Arrived: Ambulatory Any new allergies or adverse reactions: No Arrival Time: 12:43 Had a fall or experienced change in No Accompanied By: self activities of daily living that may affect Transfer Assistance: None risk of falls: Patient Identification Verified: Yes Signs or symptoms of abuse/neglect since last visito No Secondary Verification Process Completed: Yes Hospitalized since last visit: No Patient Requires Transmission-Based Precautions: No Implantable device outside of the clinic excluding No Patient Has Alerts: Yes cellular tissue based products placed in the center Patient Alerts: R and L ABIs N/C since last visit: Has Dressing in Place as Prescribed: Yes Pain Present Now: No Electronic Signature(s) Signed: 02/04/2022 2:56:00 PM By: [name] Entered By: [name] on 02/03/2022 12:43:41 -------------------------------------------------------------------------------- Clinic Level of Care Assessment Details Patient Name: Date of Service: [name] Allen L. 02/03/2022 12:30 PM Medical Record Number: [id number] Patient Account Number: [id number] Date of Birth/Sex: Treating RN: Jan 03, 1951 (71 y.o. [name] Primary Care [name]: PCP, NO Other Clinician: Referring [name]: Treating [name]/Extender: [name] in Treatment: 6 Clinic Level of Care Assessment Items TOOL 4 Quantity Score []  - 0 Use  when only an EandM is performed on FOLLOW-UP visit ASSESSMENTS - Nursing Assessment / Reassessment X- 1 10 Reassessment of Co-morbidities (includes updates in patient status) X- 1 5 Reassessment of Adherence to Treatment Plan ASSESSMENTS - Wound and Skin A ssessment / Reassessment []  - 0 Simple Wound Assessment / Reassessment - one wound []  - 0 Complex Wound Assessment / Reassessment - multiple wounds X- 1 10 Dermatologic / Skin Assessment (not related to wound area) ASSESSMENTS - Focused Assessment X- 2 5 Circumferential Edema Measurements - multi extremities []  - 0 Nutritional Assessment / Counseling / Intervention X- 1 5 Lower Extremity Assessment (monofilament, tuning fork, pulses) []  - 0 Peripheral Arterial Disease Assessment (using hand held doppler) ASSESSMENTS - Ostomy and/or Continence Assessment and Care []  - 0 Incontinence Assessment and Management []  - 0 Ostomy Care Assessment and Management (repouching, etc.) PROCESS - Coordination of Care X - Simple Patient / Family Education for ongoing care 1 15 []  - 0 Complex (extensive) Patient / Family Education for ongoing care X- 1 10 Staff obtains Programmer, systems, Records, T Results / Process Orders est X- 1 10 Staff telephones HHA, Nursing Homes / Clarify orders / etc []  - 0 Routine Transfer to another Facility (non-emergent condition) []  - 0 Routine Hospital Admission (non-emergent condition) []  - 0 New Admissions / Biomedical engineer / Ordering NPWT Apligraf, etc. , []  - 0 Emergency Hospital Admission (emergent condition) X- 1 10 Simple Discharge Coordination []  - 0 Complex (extensive) Discharge Coordination PROCESS - Special Needs []  - 0 Pediatric / Minor Patient Management []  - 0 Isolation Patient Management []  - 0 Hearing / Language / Visual special needs []  - 0 Assessment of Community assistance (transportation, D/C planning, etc.) []  - 0 Additional assistance / Altered mentation []  - 0 Support  Surface(s) Assessment (bed, cushion, seat, etc.) INTERVENTIONS - Wound Cleansing / Measurement []  - 0 Simple Wound Cleansing - one wound []  -  0 Complex Wound Cleansing - multiple wounds []  - 0 Wound Imaging (photographs - any number of wounds) []  - 0 Wound Tracing (instead of photographs) []  - 0 Simple Wound Measurement - one wound []  - 0 Complex Wound Measurement - multiple wounds INTERVENTIONS - Wound Dressings []  - 0 Small Wound Dressing one or multiple wounds []  - 0 Medium Wound Dressing one or multiple wounds []  - 0 Large Wound Dressing one or multiple wounds []  - 0 Application of Medications - topical []  - 0 Application of Medications - injection INTERVENTIONS - Miscellaneous []  - 0 External ear exam []  - 0 Specimen Collection (cultures, biopsies, blood, body fluids, etc.) []  - 0 Specimen(s) / Culture(s) sent or taken to Lab for analysis []  - 0 Patient Transfer (multiple staff / Civil Service fast streamer / Similar devices) []  - 0 Simple Staple / Suture removal (25 or less) []  - 0 Complex Staple / Suture removal (26 or more) []  - 0 Hypo / Hyperglycemic Management (close monitor of Blood Glucose) []  - 0 Ankle / Brachial Index (ABI) - do not check if billed separately X- 1 5 Vital Signs Has the patient been seen at the hospital within the last three years: Yes Total Score: 90 Level Of Care: New/Established - Level 3 Electronic Signature(s) Signed: 02/03/2022 5:17:37 PM By: Allen Gouty RN, BSN Entered By: [name] on 02/03/2022 13:02:33 -------------------------------------------------------------------------------- Encounter Discharge Information Details Patient Name: Date of Service: [name], Allen Allen L. 02/03/2022 12:30 PM Medical Record Number: [id number] Patient Account Number: [id number] Date of Birth/Sex: Treating RN: [birth year] (71 y.o. [name] Primary Care [name]: PCP, NO Other Clinician: Referring [name]: Treating [name]/Extender:  [name] in Treatment: 6 Encounter Discharge Information Items Discharge Condition: Stable Ambulatory Status: Ambulatory Discharge Destination: Home Transportation: Other Accompanied By: facility transportation Schedule Follow-up Appointment: Yes Clinical Summary of Care: Patient Declined Electronic Signature(s) Signed: 02/03/2022 5:17:37 PM By: Allen Gouty RN, BSN Entered By: [name] on 02/03/2022 13:17:13 -------------------------------------------------------------------------------- Lower Extremity Assessment Details Patient Name: Date of Service: [name], Allen Allen L. 02/03/2022 12:30 PM Medical Record Number: [id number] Patient Account Number: [id number] Date of Birth/Sex: Treating RN: [birth year] (71 y.o. [name] Primary Care Mekia Dipinto: PCP, NO Other Clinician: Referring Larsen Zettel: Treating Teancum Brule/Extender: Luiz Iron Weeks in Treatment: 6 Edema Assessment Assessed: [Left: No] [Right: No] Edema: [Left: Yes] [Right: Yes] Calf Left: Right: Point of Measurement: From Medial Instep 32 cm 34 cm Ankle Left: Right: Point of Measurement: From Medial Instep 24 cm 26 cm Vascular Assessment Pulses: Dorsalis Pedis Palpable: [Left:Yes] [Right:Yes] Electronic Signature(s) Signed: 02/03/2022 5:17:37 PM By: Allen Gouty RN, BSN Entered By: [name] on 02/03/2022 12:50:42 -------------------------------------------------------------------------------- Multi Wound Chart Details Patient Name: Date of Service: [name], Allen Allen L. 02/03/2022 12:30 PM Medical Record Number: [id number] Patient Account Number: [id number] Date of Birth/Sex: Treating RN: [birth year] (71 y.o. [name] Primary Care Alyxandria Wentz: PCP, NO Other Clinician: Referring Sanaya Gwilliam: Treating Abenezer Odonell/Extender: Luiz Iron Weeks in Treatment: 6 Vital Signs Height(in): 75 Capillary Blood Glucose(mg/dl): 74 Weight(lbs):  245 Pulse(bpm): 73 Body Mass Index(BMI): 30.6 Blood Pressure(mmHg): 118/68 Temperature(F): 98.9 Respiratory Rate(breaths/min): 18 Wound Assessments Treatment Notes Electronic Signature(s) Signed: 02/03/2022 1:12:05 PM By: Fredirick Maudlin MD FACS Signed: 02/03/2022 5:17:37 PM By: Allen Gouty RN, BSN Entered By: Fredirick Maudlin on 02/03/2022 13:12:05 -------------------------------------------------------------------------------- Multi-Disciplinary Care Plan Details Patient Name: Date of Service: [name], Allen Allen L. 02/03/2022 12:30 PM Medical Record Number: [id number] Patient Account  Number: [id number] Date of Birth/Sex: Treating RN: Apr 06, 1951 (71 y.o. [name] Primary Care Nautia Lem: PCP, NO Other Clinician: Referring Danitza Schoenfeldt: Treating Bradrick Kamau/Extender: [name] in Treatment: 6 Multidisciplinary Care Plan reviewed with physician Active Inactive Electronic Signature(s) Signed: 02/03/2022 5:17:37 PM By: Allen Gouty RN, BSN Entered By: [name] on 02/03/2022 12:51:53 -------------------------------------------------------------------------------- Non-Wound Condition Assessment Details Patient Name: Date of Service: [name], Allen Allen L. 02/03/2022 12:30 PM Medical Record Number: [id number] Patient Account Number: [id number] Date of Birth/Sex: Treating RN: Nov 18, 1951 (71 y.o. [name] Primary Care Lacrystal Barbe: PCP, NO Other Clinician: Referring Akul Leggette: Treating Fielding Mault/Extender: Luiz Iron Weeks in Treatment: 6 Non-Wound Condition: Condition: Lymphedema Location: Leg Side: Left Photos Electronic Signature(s) Signed: 02/03/2022 5:17:37 PM By: Allen Gouty RN, BSN Signed: 02/04/2022 2:56:00 PM By: [name] Entered By: [name] on 02/03/2022 12:55:51 -------------------------------------------------------------------------------- Non-Wound Condition Assessment Details Patient  Name: Date of Service: [name], Allen Allen L. 02/03/2022 12:30 PM Medical Record Number: [id number] Patient Account Number: [id number] Date of Birth/Sex: Treating RN: 28-Feb-1951 (71 y.o. [name] Primary Care Almira [place]: PCP, NO Other Clinician: Referring Citlaly Camplin: Treating Mirenda Baltazar/Extender: Luiz Iron Weeks in Treatment: 6 Non-Wound Condition: Condition: Lymphedema Location: Leg Side: Right Photos Electronic Signature(s) Signed: 02/03/2022 5:17:37 PM By: Allen Gouty RN, BSN Signed: 02/04/2022 2:56:00 PM By: [name] Entered By: [name] on 02/03/2022 12:56:27 -------------------------------------------------------------------------------- Pain Assessment Details Patient Name: Date of Service: [name], Allen Allen L. 02/03/2022 12:30 PM Medical Record Number: [id number] Patient Account Number: [id number] Date of Birth/Sex: Treating RN: 1951-08-12 (71 y.o. [name] Primary Care Unity Luepke: PCP, NO Other Clinician: Referring Lark Langenfeld: Treating Ramandeep Arington/Extender: Luiz Iron Weeks in Treatment: 6 Active Problems Location of Pain Severity and Description of Pain Patient Has Paino No Site Locations Rate the pain. Current Pain Level: 0 Pain Management and Medication Current Pain Management: Electronic Signature(s) Signed: 02/03/2022 5:17:37 PM By: Allen Gouty RN, BSN Entered By: [name] on 02/03/2022 12:46:10 -------------------------------------------------------------------------------- Patient/Caregiver Education Details Patient Name: Date of Service: [name], Allen [place] 2/22/2023andnbsp12:30 PM Medical Record Number: [id number] Patient Account Number: [id number] Date of Birth/Gender: Treating RN: Oct 31, 1951 (71 y.o. [name] Primary Care Physician: PCP, NO Other Clinician: Referring Physician: Treating Physician/Extender: [name] in Treatment: 6 Education  Assessment Education Provided To: Patient Education Topics Provided Venous: Methods: Explain/Verbal Responses: Reinforcements needed, State content correctly Wound/Skin Impairment: Methods: Explain/Verbal Responses: Reinforcements needed, State content correctly Electronic Signature(s) Signed: 02/03/2022 5:17:37 PM By: Allen Gouty RN, BSN Entered By: [name] on 02/03/2022 12:52:27 -------------------------------------------------------------------------------- [facility] Details Patient Name: Date of Service: [name], Allen Allen L. 02/03/2022 12:30 PM Medical Record Number: [id number] Patient Account Number: [id number] Date of Birth/Sex: Treating RN: [birth year] (70 y.o. [name] Primary Care Alexy Heldt: PCP, NO Other Clinician: Referring Courtlynn Holloman: Treating Joden Bonsall/Extender: Luiz Iron Weeks in Treatment: 6 Vital Signs Time Taken: 12:43 Temperature (F): 98.9 Height (in): 75 Pulse (bpm): 73 Weight (lbs): 245 Respiratory Rate (breaths/min): 18 Body Mass Index (BMI): 30.6 Blood Pressure (mmHg): 118/68 Capillary Blood Glucose (mg/dl): 74 Reference Range: 80 - 120 mg / dl Electronic Signature(s) Signed: 02/04/2022 2:56:00 PM By: [name] Entered By: [name] on 02/03/2022 12:44:13

## 2022-04-06 IMAGING — CT CT ABD-PELV W/ CM
2 of 5 series · 17 of 46 positions shown, 19 images · IV contrast (omnipaque)
Comparison: January 26, 2017.

CLINICAL DATA: Epigastric abdominal pain.

EXAM:
CT ABDOMEN AND PELVIS WITH CONTRAST
TECHNIQUE: Multidetector CT imaging of the abdomen and pelvis was performed
using the standard protocol following bolus administration of
intravenous contrast.
CONTRAST:  100mL OMNIPAQUE IOHEXOL 300 MG/ML  SOLN

[Series 2: axial st · axial · 0.98mm/px · z∈[+1256,+1680]mm · 14 of 99 slices shown, 16 images]
[im 7/99  soft-tissue]
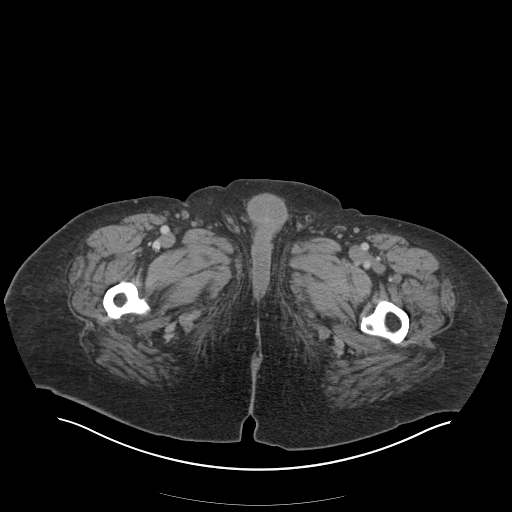
[im 7/99  bone]
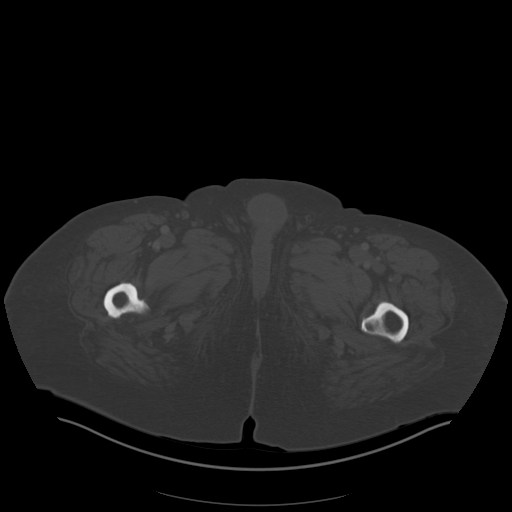
[im 14/99  soft-tissue]
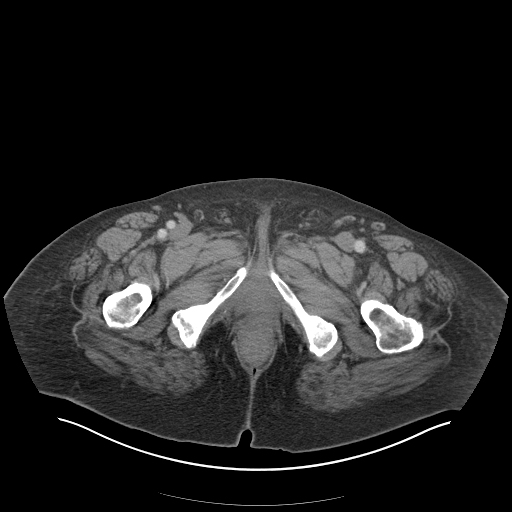
[im 20/99  soft-tissue]
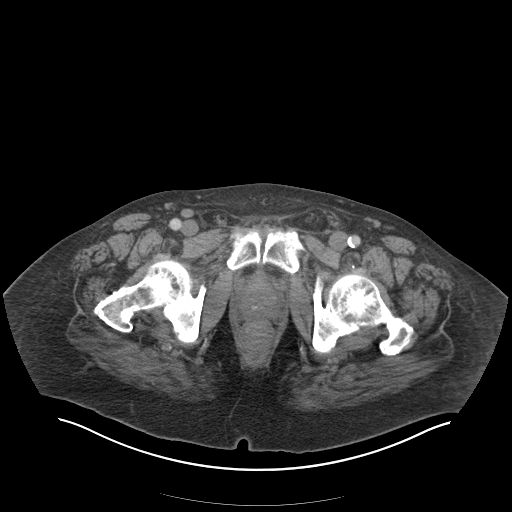
[im 27/99  soft-tissue]
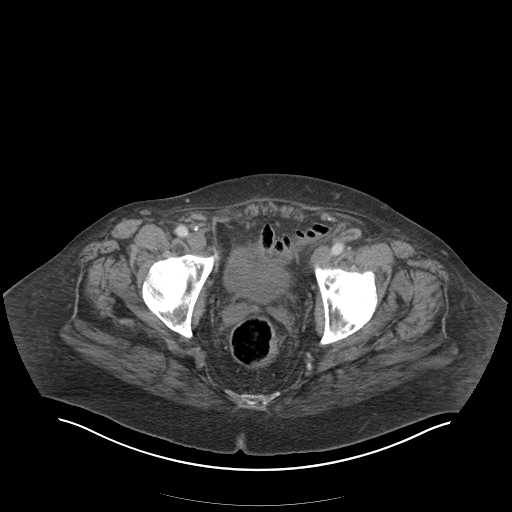
[im 33/99  soft-tissue]
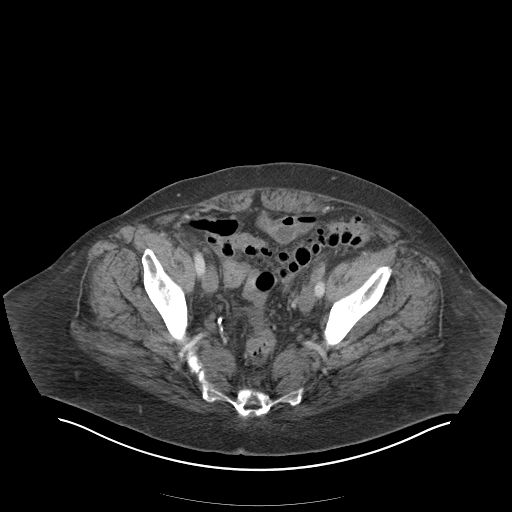
[im 40/99  soft-tissue]
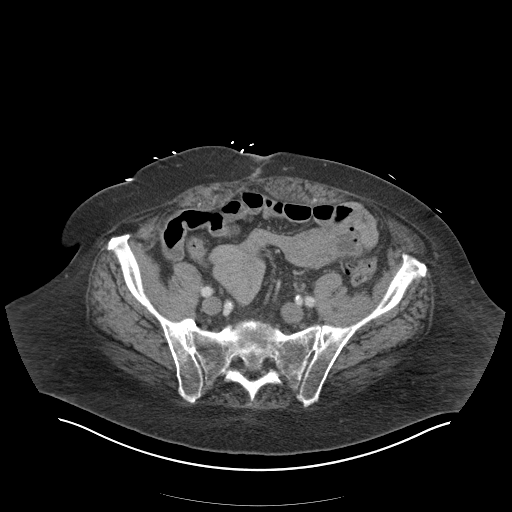
[im 46/99  soft-tissue]
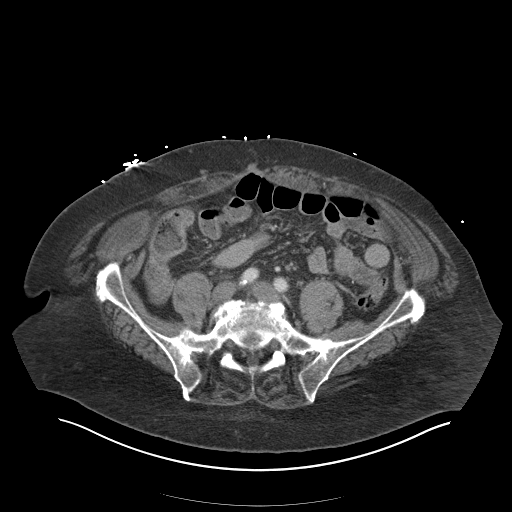
[im 53/99  soft-tissue]
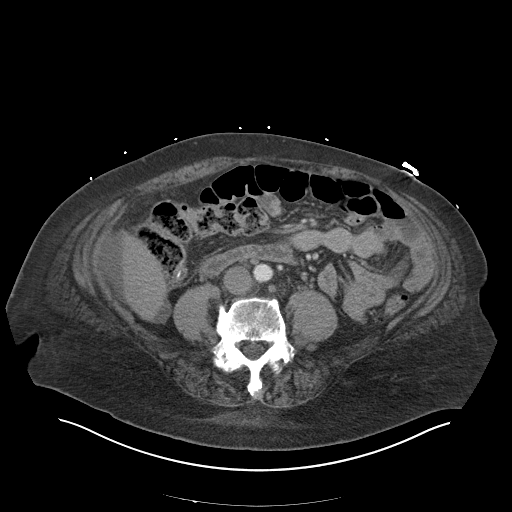
[im 59/99  soft-tissue]
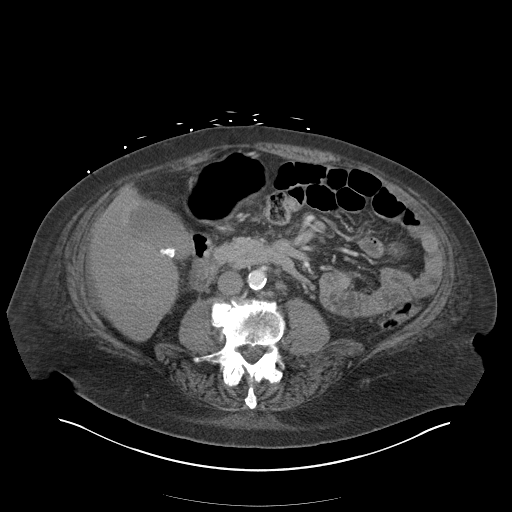
[im 59/99  bone]
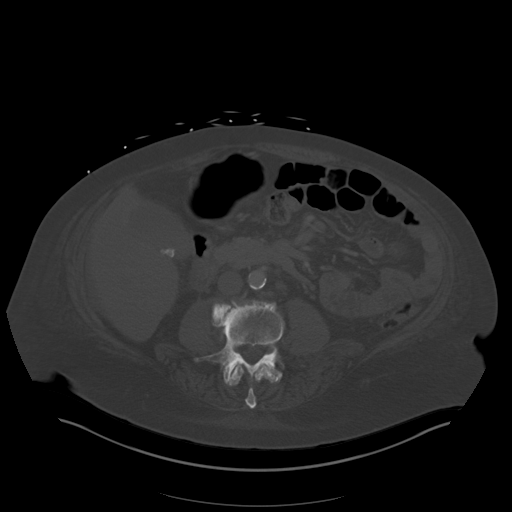
[im 66/99  soft-tissue]
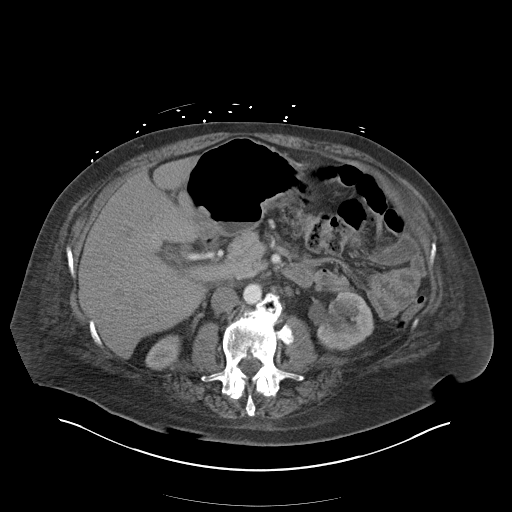
[im 72/99  soft-tissue]
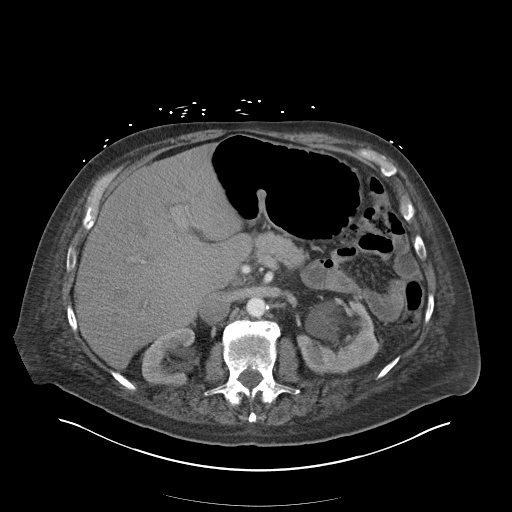
[im 79/99  soft-tissue]
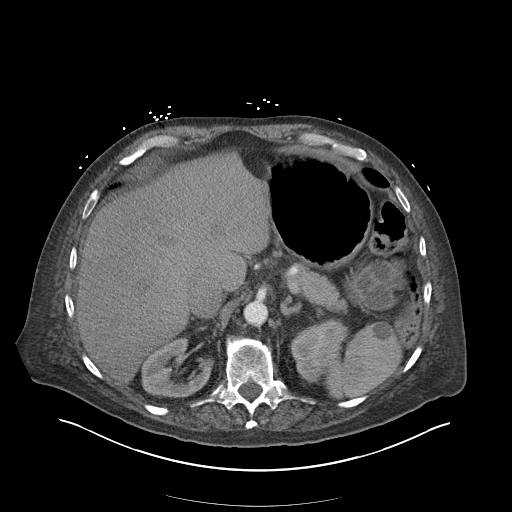
[im 85/99  soft-tissue]
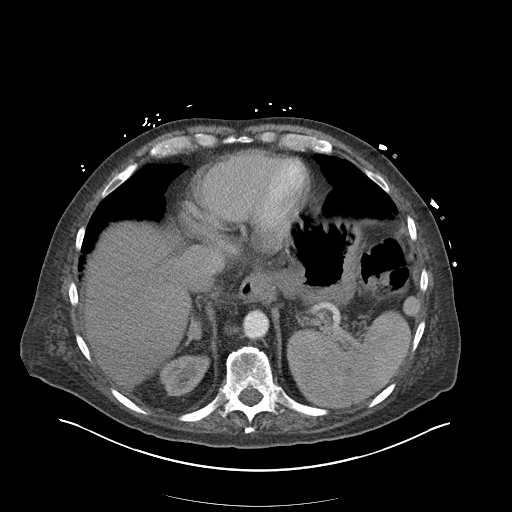
[im 92/99  soft-tissue]
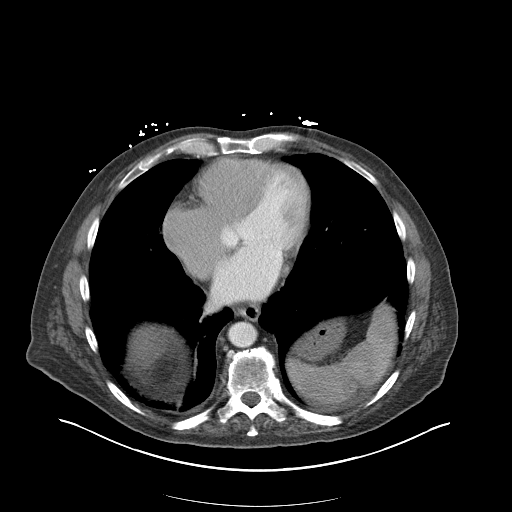

[Series 5: coronal st · coronal · 0.96mm/px · 3 of 170 slices shown]
[im 57/170  soft-tissue]
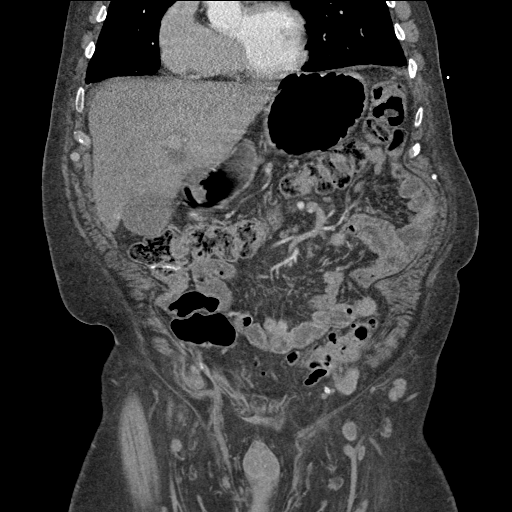
[im 76/170  soft-tissue]
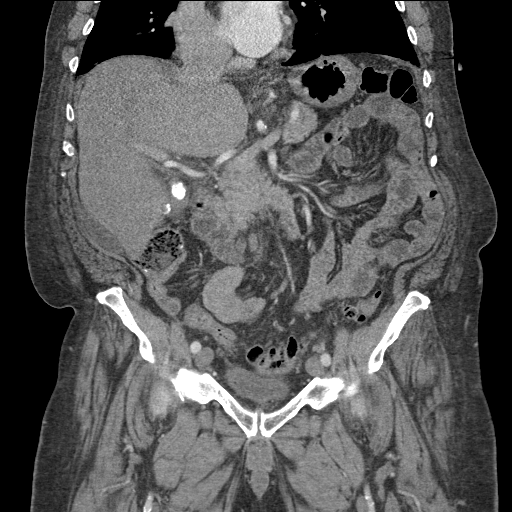
[im 94/170  soft-tissue]
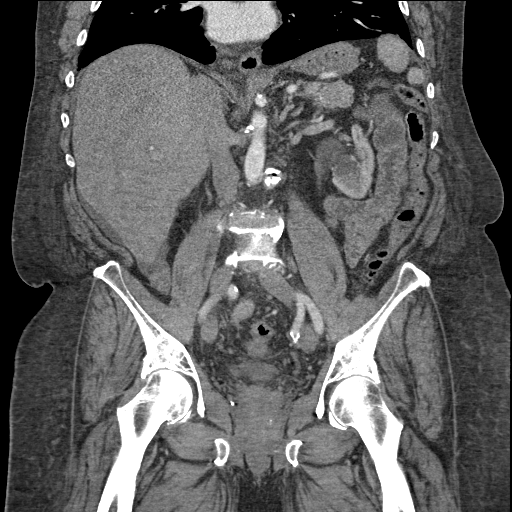

[17 of 46 positions shown; findings below may reference images not displayed]

FINDINGS: Lower chest: No acute abnormality.

Hepatobiliary: Cholelithiasis is noted without biliary dilatation.
Liver is unremarkable.

Pancreas: Unremarkable. No pancreatic ductal dilatation or
surrounding inflammatory changes.

Spleen: Normal in size without focal abnormality.

Adrenals/Urinary Tract: Adrenal glands appear normal. Probable left
parapelvic cysts are noted. No hydronephrosis or renal obstruction
is noted. Urinary bladder is unremarkable.

Stomach/Bowel: Stomach is unremarkable. There is no evidence of
bowel obstruction or inflammation. Diverticulosis of descending and
sigmoid colon is noted without inflammation.

Vascular/Lymphatic: Aortic atherosclerosis. No enlarged abdominal or
pelvic lymph nodes.

Reproductive: Prostate is unremarkable.

Other: No abdominal wall hernia or abnormality. No abdominopelvic
ascites.

Musculoskeletal: No acute or significant osseous findings.
IMPRESSION: Cholelithiasis without definite evidence of cholecystitis.

Diverticulosis of descending and sigmoid colon without inflammation.

No definite acute abnormality seen in the abdomen or pelvis.

Aortic Atherosclerosis ([id number]).

## 2022-04-06 IMAGING — US US ABDOMEN LIMITED
1 series · 15 of 25 positions shown · non-contrast
Comparison: CT abdomen 12/22/2021, 01/26/2017

CLINICAL DATA: Epigastric pain, gallstones

EXAM:
ULTRASOUND ABDOMEN LIMITED RIGHT UPPER QUADRANT

[Series 1: us abdomen limited mc & wl · 15 of 54 slices shown]
[im 1/54]
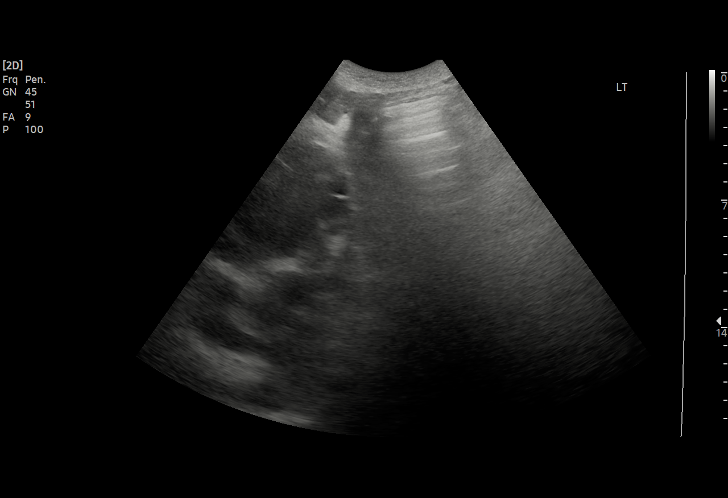
[im 5/54]
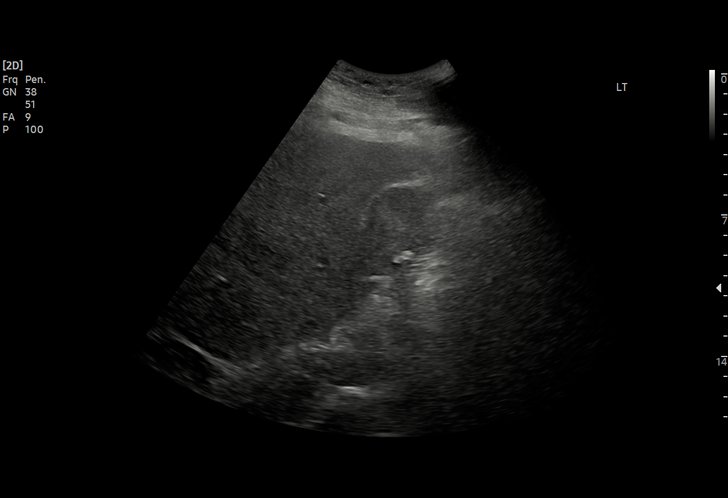
[im 9/54]
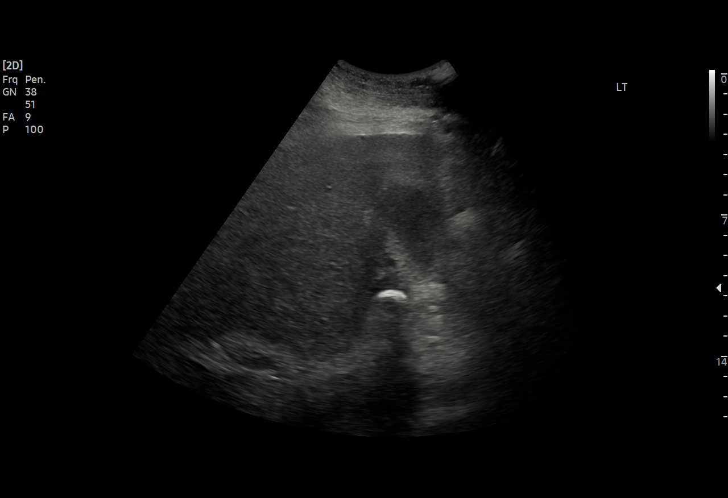
[im 12/54]
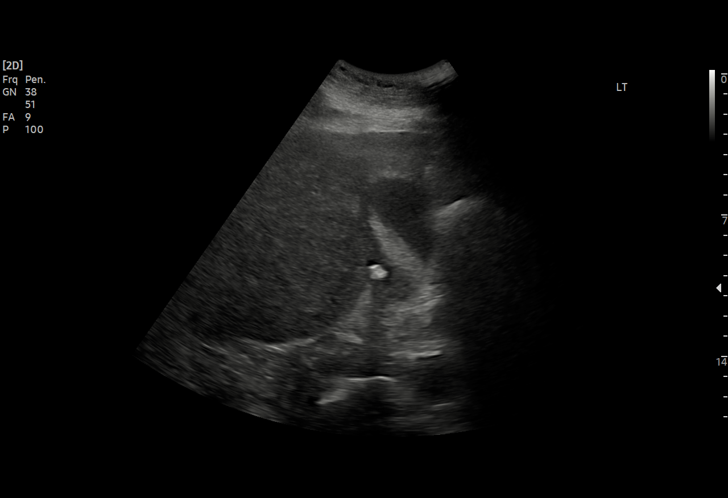
[im 16/54]
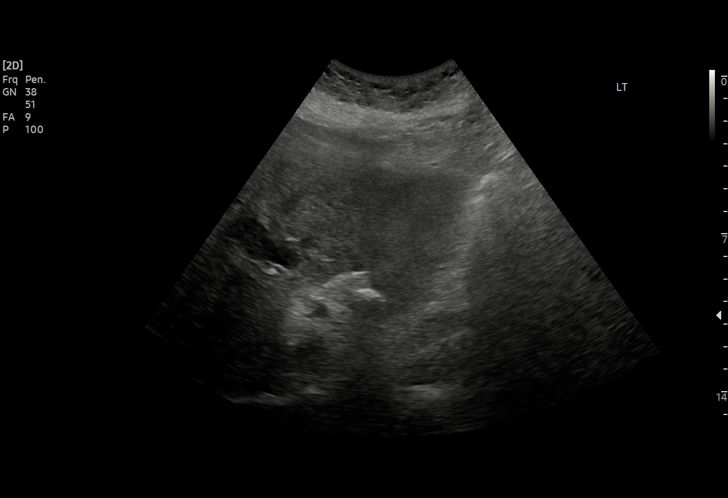
[im 20/54]
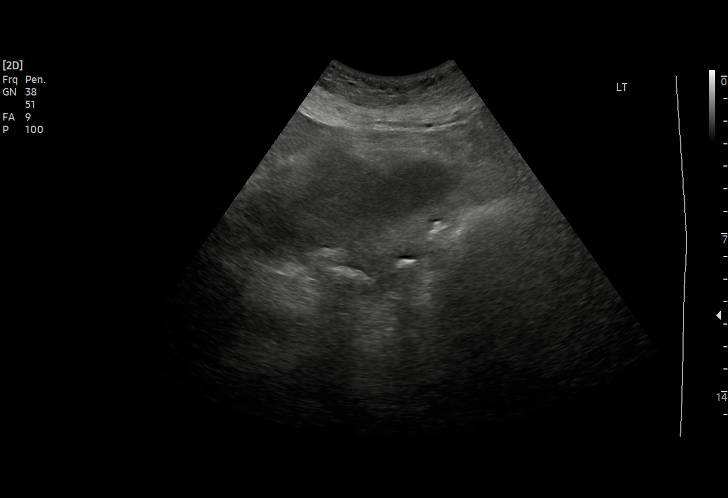
[im 23/54]
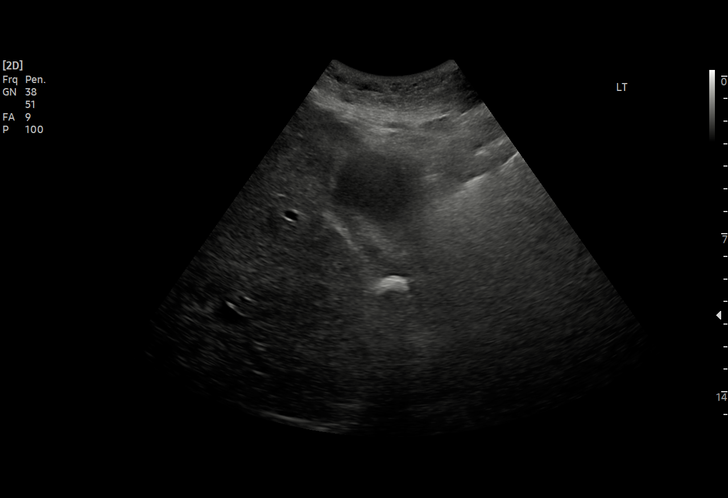
[im 27/54]
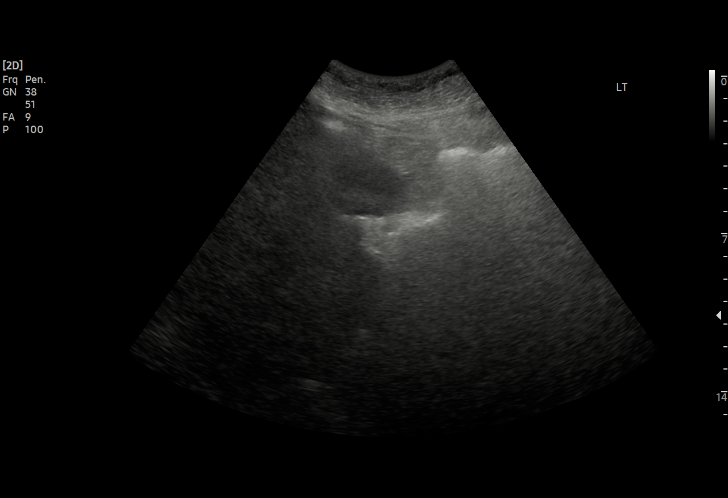
[im 31/54]
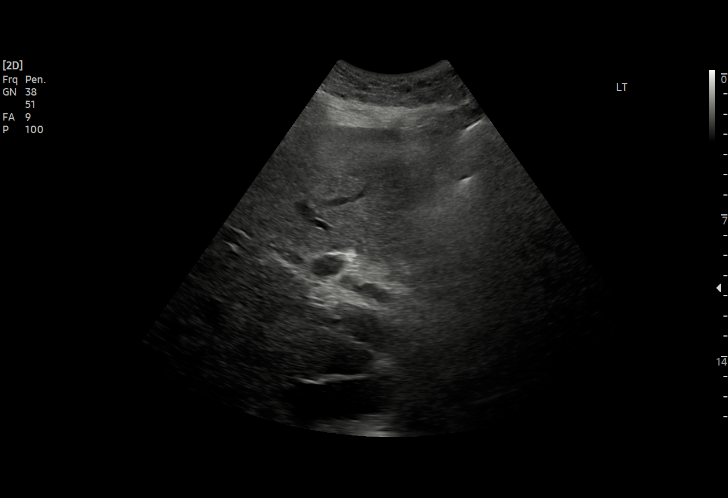
[im 34/54]
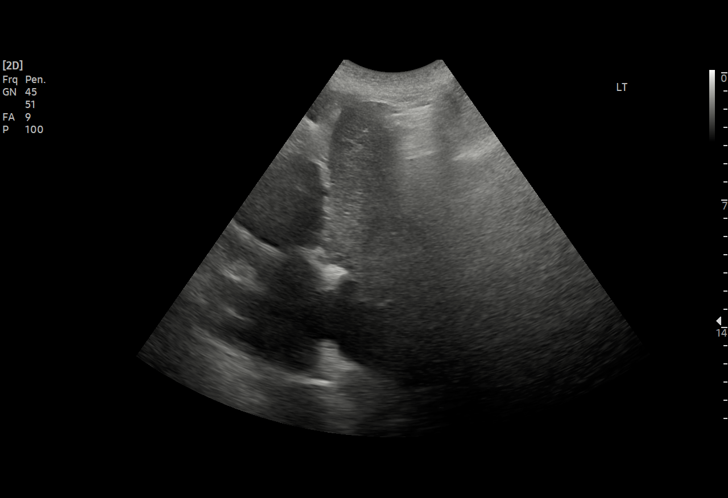
[im 38/54]
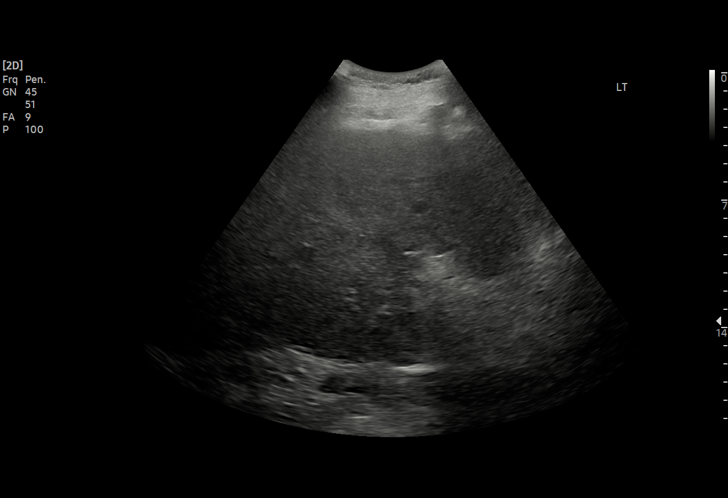
[im 42/54]
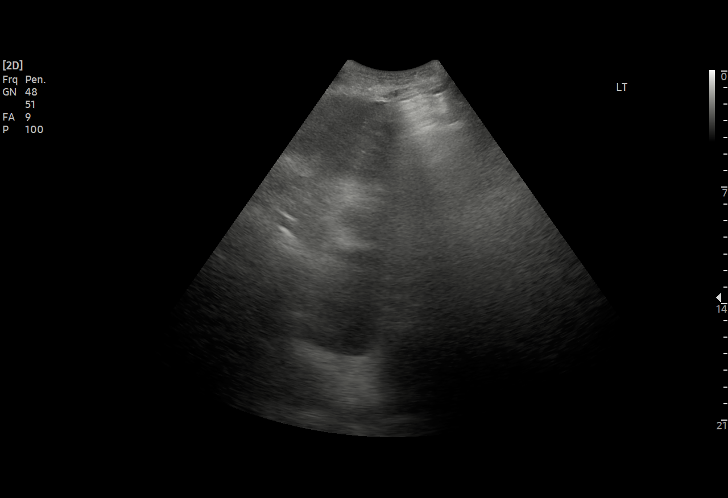
[im 45/54]
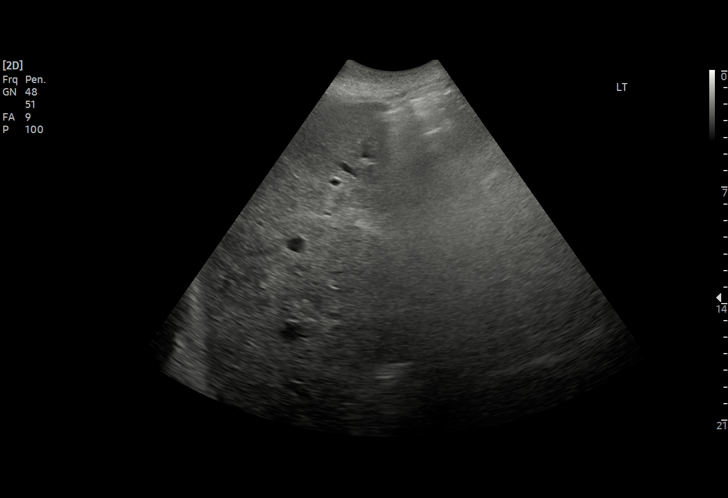
[im 49/54]
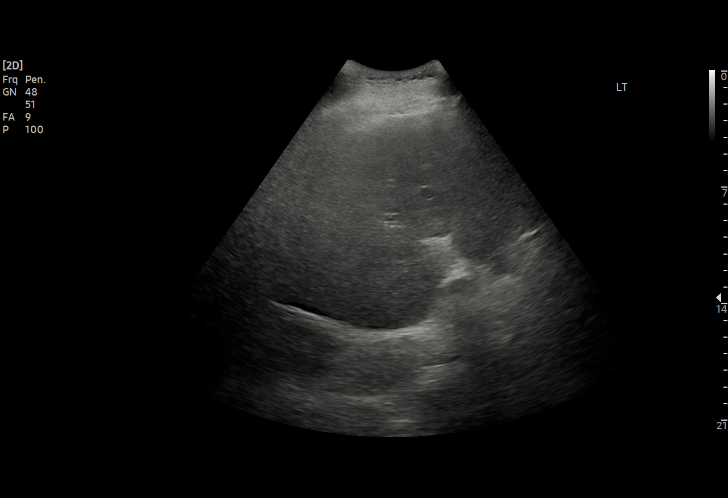
[im 54/54]
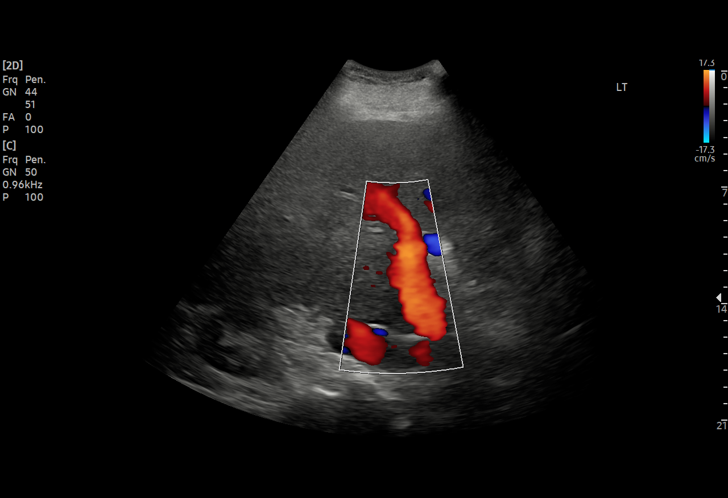

[15 of 25 positions shown; findings below may reference images not displayed]

FINDINGS: Gallbladder:

Cholelithiasis. No gallbladder wall thickening or pericholecystic
fluid. Small amount of gallbladder sludge. Negative sonographic
Murphy sign.

Common bile duct:

Diameter: 6.6 mm

Liver:

No focal lesion identified. Increased hepatic parenchymal
echogenicity. Portal vein is patent on color Doppler imaging with
normal direction of blood flow towards the liver.

Other: None.
IMPRESSION: 1. Cholelithiasis without sonographic evidence of acute
cholecystitis.
2. Increased hepatic echogenicity as can be seen with hepatic
steatosis.

## 2022-05-06 DIAGNOSIS — M79671 Pain in right foot: Secondary | ICD-10-CM | POA: Insufficient documentation

## 2022-06-01 ENCOUNTER — Encounter (HOSPITAL_BASED_OUTPATIENT_CLINIC_OR_DEPARTMENT_OTHER): Payer: Medicare Other | Attending: Internal Medicine | Admitting: Internal Medicine

## 2022-06-01 DIAGNOSIS — I872 Venous insufficiency (chronic) (peripheral): Secondary | ICD-10-CM | POA: Diagnosis not present

## 2022-06-01 DIAGNOSIS — E11621 Type 2 diabetes mellitus with foot ulcer: Secondary | ICD-10-CM | POA: Diagnosis not present

## 2022-06-01 DIAGNOSIS — N183 Chronic kidney disease, stage 3 unspecified: Secondary | ICD-10-CM | POA: Diagnosis not present

## 2022-06-01 DIAGNOSIS — E1140 Type 2 diabetes mellitus with diabetic neuropathy, unspecified: Secondary | ICD-10-CM | POA: Diagnosis not present

## 2022-06-01 DIAGNOSIS — L97812 Non-pressure chronic ulcer of other part of right lower leg with fat layer exposed: Secondary | ICD-10-CM | POA: Diagnosis present

## 2022-06-01 DIAGNOSIS — I129 Hypertensive chronic kidney disease with stage 1 through stage 4 chronic kidney disease, or unspecified chronic kidney disease: Secondary | ICD-10-CM | POA: Diagnosis not present

## 2022-06-01 DIAGNOSIS — L97822 Non-pressure chronic ulcer of other part of left lower leg with fat layer exposed: Secondary | ICD-10-CM | POA: Insufficient documentation

## 2022-06-01 DIAGNOSIS — E1122 Type 2 diabetes mellitus with diabetic chronic kidney disease: Secondary | ICD-10-CM | POA: Diagnosis not present

## 2022-06-01 DIAGNOSIS — E11622 Type 2 diabetes mellitus with other skin ulcer: Secondary | ICD-10-CM | POA: Insufficient documentation

## 2022-06-01 DIAGNOSIS — I890 Lymphedema, not elsewhere classified: Secondary | ICD-10-CM | POA: Diagnosis not present

## 2022-06-01 DIAGNOSIS — I87313 Chronic venous hypertension (idiopathic) with ulcer of bilateral lower extremity: Secondary | ICD-10-CM | POA: Insufficient documentation

## 2022-06-01 NOTE — Progress Notes (Signed)
Allen, Reese ([id number]) Visit Report for 06/01/2022 Abuse Risk Screen Details Patient Name: Date of Service: [name] 06/01/2022 1:15 PM Medical Record Number: [id number] Patient Account Number: [id number] Date of Birth/Sex: Treating RN: 1951-07-07 (71 y.o. [name] Primary Care [name]: PCP, NO Other Clinician: Referring [name]: Treating [name]/Extender: [name] in Treatment: 0 Abuse Risk Screen Items Answer ABUSE RISK SCREEN: Has anyone close to you tried to hurt or harm you recentlyo No Do you feel uncomfortable with anyone in your familyo No Has anyone forced you do things that you didnt want to doo No Electronic Signature(s) Signed: 06/01/2022 6:10:01 PM By: [name] Entered By: [name] on 06/01/2022 13:37:45 -------------------------------------------------------------------------------- Activities of Daily Living Details Patient Name: Date of Service: [name] Allen L. 06/01/2022 1:15 PM Medical Record Number: [id number] Patient Account Number: [id number] Date of Birth/Sex: Treating RN: 01/01/1951 (71 y.o. [name] Primary Care [name]: PCP, NO Other Clinician: Referring [name]: Treating Yarel Rushlow/Extender: [name] in Treatment: 0 Activities of Daily Living Items Answer Activities of Daily Living (Please select one for each item) Drive Automobile Not Able T Medications ake Need Assistance Use T elephone Completely Able Care for Appearance Need Assistance Use T oilet Need Assistance Bath / Shower Need Assistance Dress Self Need Assistance Feed Self Completely Able Walk Need Assistance Get In / Out Bed Completely [place] for Self Need Assistance Electronic Signature(s) Signed: 06/01/2022 6:10:01 PM By: [name] Entered By: [name] on 06/01/2022  13:38:32 -------------------------------------------------------------------------------- Education Screening Details Patient Name: Date of Service: [name], Allen Allen L. 06/01/2022 1:15 PM Medical Record Number: [id number] Patient Account Number: [id number] Date of Birth/Sex: Treating RN: [birth year] (71 y.o. [name] Primary Care [name]: PCP, NO Other Clinician: Referring Aleem Elza: Treating Sankalp Ferrell/Extender: [name] in Treatment: 0 Primary Learner Assessed: Patient Learning Preferences/Education Level/Primary Language Learning Preference: Explanation, Demonstration, Printed Material Highest Education Level: College or Above Preferred Language: English Cognitive Barrier Language Barrier: No Translator Needed: No Memory Deficit: No Emotional Barrier: No Cultural/Religious Beliefs Affecting Medical Care: No Physical Barrier Impaired Vision: Yes Glasses Impaired Hearing: No Decreased Hand dexterity: No Knowledge/Comprehension Knowledge Level: High Comprehension Level: High Ability to understand written instructions: High Ability to understand verbal instructions: High Motivation Anxiety Level: Calm Cooperation: Cooperative Education Importance: Acknowledges Need Interest in Health Problems: Asks Questions Perception: Coherent Willingness to Engage in Self-Management High Activities: Readiness to Engage in Self-Management High Activities: Electronic Signature(s) Signed: 06/01/2022 6:10:01 PM By: [name] Entered By: [name] on 06/01/2022 13:39:13 -------------------------------------------------------------------------------- Fall Risk Assessment Details Patient Name: Date of Service: [name], Allen Allen L. 06/01/2022 1:15 PM Medical Record Number: [id number] Patient Account Number: [id number] Date of Birth/Sex: Treating RN: Nov 15, 1951 (71 y.o. [name] Primary Care [name]: PCP, NO Other Clinician: Referring  [name]: Treating [name]/Extender: [name] in Treatment: 0 Fall Risk Assessment Items Have you had 2 or more falls in the last 12 monthso 0 No Have you had any fall that resulted in injury in the last 12 monthso 0 No FALLS RISK SCREEN History of falling - immediate or within 3 months 0 No Secondary diagnosis (Do you have 2 or more medical diagnoseso) 15 Yes Ambulatory aid None/bed rest/wheelchair/nurse 0 No Crutches/cane/walker 15 Yes Furniture 0 No Intravenous therapy Access/Saline/Heparin Lock 0 No Gait/Transferring Normal/ bed rest/ wheelchair 0 Yes  Weak (short steps with or without shuffle, stooped but able to lift head while walking, may seek 0 No support from furniture) Impaired (short steps with shuffle, may have difficulty arising from chair, head down, impaired 0 No balance) Mental Status Oriented to own ability 0 Yes Electronic Signature(s) Signed: 06/01/2022 6:10:01 PM By: [name] Entered By: [name] on 06/01/2022 13:39:33 -------------------------------------------------------------------------------- Foot Assessment Details Patient Name: Date of Service: [name], Allen Allen L. 06/01/2022 1:15 PM Medical Record Number: [id number] Patient Account Number: [id number] Date of Birth/Sex: Treating RN: 08-13-1951 (71 y.o. [name] Primary Care [name]: PCP, NO Other Clinician: Referring [name]: Treating [name]/Extender: [name] in Treatment: 0 Foot Assessment Items Site Locations + = Sensation present, - = Sensation absent, C = Callus, U = Ulcer R = Redness, W = Warmth, M = Maceration, PU = Pre-ulcerative lesion F = Fissure, S = Swelling, D = Dryness Assessment Right: Left: Other Deformity: No No Prior Foot Ulcer: No No Prior Amputation: No No Charcot Joint: No No Ambulatory Status: Ambulatory With Help Assistance Device: Walker Gait: Steady Electronic Signature(s) Signed:  06/01/2022 6:10:01 PM By: [name] Entered By: [name] on 06/01/2022 13:48:02 -------------------------------------------------------------------------------- Nutrition Risk Screening Details Patient Name: Date of Service: [name], Allen Allen L. 06/01/2022 1:15 PM Medical Record Number: [id number] Patient Account Number: [id number] Date of Birth/Sex: Treating RN: September 28, 1951 (71 y.o. [name] Primary Care Praveen Coia: PCP, NO Other Clinician: Referring Quintasia Theroux: Treating Yamile Roedl/Extender: [name] in Treatment: 0 Height (in): 75 Weight (lbs): 245 Body Mass Index (BMI): 30.6 Nutrition Risk Screening Items Score Screening NUTRITION RISK SCREEN: I have an illness or condition that made me change the kind and/or amount of food I eat 0 No I eat fewer than two meals per day 0 No I eat few fruits and vegetables, or milk products 0 No I have three or more drinks of beer, liquor or wine almost every day 0 No I have tooth or mouth problems that make it hard for me to eat 0 No I don't always have enough money to buy the food I need 0 No I eat alone most of the time 0 No I take three or more different prescribed or over-the-counter drugs a day 1 Yes Without wanting to, I have lost or gained 10 pounds in the last six months 0 No I am not always physically able to shop, cook and/or feed myself 0 No Nutrition Protocols Good Risk Protocol 0 No interventions needed Moderate Risk Protocol High Risk Proctocol Risk Level: Good Risk Score: 1 Electronic Signature(s) Signed: 06/01/2022 6:10:01 PM By: [name] Entered By: [name] on 06/01/2022 13:42:39

## 2022-06-01 NOTE — Progress Notes (Signed)
CHARVEZ, VOORHIES ([id number]) Visit Report for 06/01/2022 Allergy List Details Patient Name: Date of Service: [name] 06/01/2022 1:15 PM Medical Record Number: [id number] Patient Account Number: [id number] Date of Birth/Sex: Treating RN: [birth year] (71 y.o. [name] Primary Care [name]: PCP, NO Other Clinician: Referring [name]: Treating [name]/Extender: [name] in Treatment: 0 Allergies Active Allergies No Known Allergies Allergy Notes Electronic Signature(s) Signed: 06/01/2022 6:10:01 PM By: [name] Entered By: [name] on 06/01/2022 13:37:13 -------------------------------------------------------------------------------- Arrival Information Details Patient Name: Date of Service: [name], Allen [name] 06/01/2022 1:15 PM Medical Record Number: [id number] Patient Account Number: [id number] Date of Birth/Sex: Treating RN: 05/05/1951 (71 y.o. [name] Primary Care [name]: PCP, NO Other Clinician: Referring [name]: Treating [name]/Extender: [name] in Treatment: 0 Visit Information Patient Arrived: Walker Arrival Time: 13:31 Transfer Assistance: Manual Patient Identification Verified: Yes Secondary Verification Process Completed: Yes Patient Requires Transmission-Based Precautions: No Patient Has Alerts: Yes Patient Alerts: Patient on Blood Thinner History Since Last Visit Added or deleted any medications: No Any new allergies or adverse reactions: No Had a fall or experienced change in activities of daily living that may affect risk of falls: No Signs or symptoms of abuse/neglect since last visito No Hospitalized since last visit: No Implantable device outside of the clinic excluding cellular tissue based products placed in the center since last visit: No Has Dressing in Place as Prescribed: Yes Pain Present Now: No Electronic Signature(s) Signed: 06/01/2022 6:10:01 PM  By: [name] Entered By: [name] on 06/01/2022 13:37:06 -------------------------------------------------------------------------------- Clinic Level of Care Assessment Details Patient Name: Date of Service: Allen Hartigan [name] 06/01/2022 1:15 PM Medical Record Number: [id number] Patient Account Number: [id number] Date of Birth/Sex: Treating RN: 06-21-1951 (71 y.o. [name] Primary Care [name]: PCP, NO Other Clinician: Referring [name]: Treating [name]/Extender: [name] in Treatment: 0 Clinic Level of Care Assessment Items TOOL 2 Quantity Score X- 1 0 Use when only an EandM is performed on the INITIAL visit ASSESSMENTS - Nursing Assessment / Reassessment X- 1 20 General Physical Exam (combine w/ comprehensive assessment (listed just below) when performed on new pt. evals) X- 1 25 Comprehensive Assessment (HX, ROS, Risk Assessments, Wounds Hx, etc.) ASSESSMENTS - Wound and Skin A ssessment / Reassessment '[]'$  - 0 Simple Wound Assessment / Reassessment - one wound X- 4 5 Complex Wound Assessment / Reassessment - multiple wounds '[]'$  - 0 Dermatologic / Skin Assessment (not related to wound area) ASSESSMENTS - Ostomy and/or Continence Assessment and Care '[]'$  - 0 Incontinence Assessment and Management '[]'$  - 0 Ostomy Care Assessment and Management (repouching, etc.) PROCESS - Coordination of Care '[]'$  - 0 Simple Patient / Family Education for ongoing care X- 1 20 Complex (extensive) Patient / Family Education for ongoing care X- 1 10 Staff obtains Programmer, systems, Records, T Results / Process Orders est X- 1 10 Staff telephones HHA, Nursing Homes / Clarify orders / etc '[]'$  - 0 Routine Transfer to another Facility (non-emergent condition) '[]'$  - 0 Routine Hospital Admission (non-emergent condition) '[]'$  - 0 New Admissions / Biomedical engineer / Ordering NPWT Apligraf, etc. , '[]'$  - 0 Emergency Hospital Admission (emergent  condition) '[]'$  - 0 Simple Discharge Coordination '[]'$  - 0 Complex (extensive) Discharge Coordination PROCESS - Special Needs '[]'$  - 0 Pediatric / Minor Patient Management '[]'$  - 0 Isolation Patient Management '[]'$  - 0 Hearing / Language / Visual special needs '[]'$  -  0 Assessment of Community assistance (transportation, D/C planning, etc.) '[]'$  - 0 Additional assistance / Altered mentation '[]'$  - 0 Support Surface(s) Assessment (bed, cushion, seat, etc.) INTERVENTIONS - Wound Cleansing / Measurement X- 1 5 Wound Imaging (photographs - any number of wounds) '[]'$  - 0 Wound Tracing (instead of photographs) '[]'$  - 0 Simple Wound Measurement - one wound X- 4 5 Complex Wound Measurement - multiple wounds '[]'$  - 0 Simple Wound Cleansing - one wound X- 4 5 Complex Wound Cleansing - multiple wounds INTERVENTIONS - Wound Dressings '[]'$  - 0 Small Wound Dressing one or multiple wounds '[]'$  - 0 Medium Wound Dressing one or multiple wounds X- 2 20 Large Wound Dressing one or multiple wounds '[]'$  - 0 Application of Medications - injection INTERVENTIONS - Miscellaneous '[]'$  - 0 External ear exam '[]'$  - 0 Specimen Collection (cultures, biopsies, blood, body fluids, etc.) '[]'$  - 0 Specimen(s) / Culture(s) sent or taken to Lab for analysis '[]'$  - 0 Patient Transfer (multiple staff / Civil Service fast streamer / Similar devices) '[]'$  - 0 Simple Staple / Suture removal (25 or less) '[]'$  - 0 Complex Staple / Suture removal (26 or more) '[]'$  - 0 Hypo / Hyperglycemic Management (close monitor of Blood Glucose) '[]'$  - 0 Ankle / Brachial Index (ABI) - do not check if billed separately Has the patient been seen at the hospital within the last three years: Yes Total Score: 190 Level Of Care: New/Established - Level 5 Electronic Signature(s) Signed: 06/01/2022 6:10:01 PM By: [name] Entered By: [name] on 06/01/2022 14:32:30 -------------------------------------------------------------------------------- Compression Therapy  Details Patient Name: Date of Service: [name], Allen [name] 06/01/2022 1:15 PM Medical Record Number: [id number] Patient Account Number: [id number] Date of Birth/Sex: Treating RN: [birth year] (71 y.o. [name] Primary Care Reylynn Vanalstine: PCP, NO Other Clinician: Referring Sahvanna Mcmanigal: Treating Thandiwe Siragusa/Extender: [name] in Treatment: 0 Compression Therapy Performed for Wound Assessment: Wound #10 Right,Lateral Lower Leg Performed By: Clinician Allen Jackson, RN Compression Type: Three Layer Post Procedure Diagnosis Same as Pre-procedure Electronic Signature(s) Signed: 06/01/2022 6:10:01 PM By: [name] Entered By: [name] on 06/01/2022 14:27:27 -------------------------------------------------------------------------------- Compression Therapy Details Patient Name: Date of Service: [name], Allen [name] 06/01/2022 1:15 PM Medical Record Number: [id number] Patient Account Number: [id number] Date of Birth/Sex: Treating RN: Jul 27, 1951 (71 y.o. [name] Primary Care Guled Gahan: PCP, NO Other Clinician: Referring Lauretta Sallas: Treating Ranjit Ashurst/Extender: [name] in Treatment: 0 Compression Therapy Performed for Wound Assessment: Wound #11 Right,Anterior Lower Leg Performed By: Clinician Allen Jackson, RN Compression Type: Three Layer Post Procedure Diagnosis Same as Pre-procedure Electronic Signature(s) Signed: 06/01/2022 6:10:01 PM By: [name] Entered By: [name] on 06/01/2022 14:27:27 -------------------------------------------------------------------------------- Compression Therapy Details Patient Name: Date of Service: [name], Allen [name] 06/01/2022 1:15 PM Medical Record Number: [id number] Patient Account Number: [id number] Date of Birth/Sex: Treating RN: [birth year] (71 y.o. [name] Primary Care Forney Kleinpeter: PCP, NO Other Clinician: Referring Axavier Pressley: Treating Winna Golla/Extender:  [name] in Treatment: 0 Compression Therapy Performed for Wound Assessment: Wound #12 Right,Distal,Anterior Lower Leg Performed By: Clinician Allen Jackson, RN Compression Type: Three Layer Post Procedure Diagnosis Same as Pre-procedure Electronic Signature(s) Signed: 06/01/2022 6:10:01 PM By: [name] Entered By: [name] on 06/01/2022 14:27:27 -------------------------------------------------------------------------------- Compression Therapy Details Patient Name: Date of Service: [name], Allen [name] 06/01/2022 1:15 PM Medical Record Number: [id number] Patient Account Number: [id number] Date of Birth/Sex: Treating RN: [birth year] (71 y.o. Fenton Foy,  Leveda Anna Primary Care Deshanti Adcox: PCP, NO Other Clinician: Referring Shari Natt: Treating Rebekka Lobello/Extender: [name] in Treatment: 0 Compression Therapy Performed for Wound Assessment: Wound #13 Left,Lateral Lower Leg Performed By: Clinician Allen Jackson, RN Compression Type: Three Layer Post Procedure Diagnosis Same as Pre-procedure Electronic Signature(s) Signed: 06/01/2022 6:10:01 PM By: [name] Entered By: [name] on 06/01/2022 14:27:27 -------------------------------------------------------------------------------- Encounter Discharge Information Details Patient Name: Date of Service: [name], Allen [name] 06/01/2022 1:15 PM Medical Record Number: [id number] Patient Account Number: [id number] Date of Birth/Sex: Treating RN: Apr 29, 1951 (71 y.o. [name] Primary Care Naw Lasala: PCP, NO Other Clinician: Referring Ido Wollman: Treating Saydi Kobel/Extender: [name] in Treatment: 0 Encounter Discharge Information Items Discharge Condition: Stable Ambulatory Status: Walker Discharge Destination: [place] Orders Sent: Yes Transportation: Other Schedule Follow-up Appointment: Yes Clinical Summary of Care:  Provided on 06/01/2022 Form Type Recipient Paper Patient Patient Electronic Signature(s) Signed: 06/01/2022 6:10:01 PM By: [name] Entered By: [name] on 06/01/2022 15:23:59 -------------------------------------------------------------------------------- Lower Extremity Assessment Details Patient Name: Date of Service: [name], Allen [name] 06/01/2022 1:15 PM Medical Record Number: [id number] Patient Account Number: [id number] Date of Birth/Sex: Treating RN: 12-29-1950 (71 y.o. [name] Primary Care Myiah Petkus: PCP, NO Other Clinician: Referring Leimomi Zervas: Treating Livana Yerian/Extender: [name] in Treatment: 0 Edema Assessment Assessed: [Left: Yes] [Right: Yes] Edema: [Left: Yes] [Right: Yes] Calf Left: Right: Point of Measurement: 37 cm From Medial Instep 45 cm 53.3 cm Ankle Left: Right: Point of Measurement: 11 cm From Medial Instep 27.8 cm 30 cm Knee To Floor Left: Right: From Medial Instep 47 cm 47 cm Vascular Assessment Pulses: Dorsalis Pedis Palpable: [Left:Yes] [Right:Yes] Electronic Signature(s) Signed: 06/01/2022 6:10:01 PM By: [name] Entered By: [name] on 06/01/2022 13:50:23 -------------------------------------------------------------------------------- Multi Wound Chart Details Patient Name: Date of Service: [name], Allen [name] 06/01/2022 1:15 PM Medical Record Number: [id number] Patient Account Number: [id number] Date of Birth/Sex: Treating RN: [birth year] (71 y.o. M) Primary Care Nakeesha Bowler: PCP, NO Other Clinician: Referring Katelan Hirt: Treating Chasidy Janak/Extender: Duayne Cal, Shawn Reese in Treatment: 0 Vital Signs Height(in): Pulse(bpm): 56 Weight(lbs): Blood Pressure(mmHg): 114/67 Body Mass Index(BMI): Temperature(F): 98.3 Respiratory Rate(breaths/min): 18 Photos: Right, Lateral Lower Leg Right, Anterior Lower Leg Right, Distal, Anterior Lower Leg Wound Location: Blister Blister  Gradually Appeared Wounding Event: Venous Leg Ulcer Venous Leg Ulcer Venous Leg Ulcer Primary Etiology: Cataracts, Lymphedema, Arrhythmia, Cataracts, Lymphedema, Arrhythmia, Cataracts, Lymphedema, Arrhythmia, Comorbid History: Peripheral Venous Disease, Type II Peripheral Venous Disease, Type II Peripheral Venous Disease, Type II Diabetes, Osteoarthritis, Neuropathy Diabetes, Osteoarthritis, Neuropathy Diabetes, Osteoarthritis, Neuropathy 04/26/2022 04/26/2022 04/26/2022 Date Acquired: 0 0 0 Reese of Treatment: Open Open Open Wound Status: No No No Wound Recurrence: 5x2.5x0.1 3x1.5x0.1 1x2x0.1 Measurements L x W x D (cm) 9.817 3.534 1.571 A (cm) : rea 0.982 0.353 0.157 Volume (cm) : 0.00% 0.00% 0.00% % Reduction in Area: 0.00% 0.00% 0.00% % Reduction in Volume: Full Thickness Without Exposed Full Thickness Without Exposed Full Thickness Without Exposed Classification: Support Structures Support Structures Support Structures Medium Medium Medium Exudate Amount: Serosanguineous Serosanguineous Serosanguineous Exudate Type: red, brown red, brown red, brown Exudate Color: Distinct, outline attached Distinct, outline attached Distinct, outline attached Wound Margin: Large (67-100%) Large (67-100%) Large (67-100%) Granulation Amount: Red Red Red Granulation Quality: Small (1-33%) Small (1-33%) Small (1-33%) Necrotic Amount: Fat Layer (Subcutaneous Tissue): Yes Fat Layer (Subcutaneous Tissue): Yes Fat Layer (Subcutaneous Tissue): Yes Exposed Structures: Fascia: No Fascia: No Fascia: No  Tendon: No Tendon: No Tendon: No Muscle: No Muscle: No Muscle: No Joint: No Joint: No Joint: No Bone: No Bone: No Bone: No None None None Epithelialization: Compression Therapy Compression Therapy Compression Therapy Procedures Performed: Wound Number: 13 N/A N/A Photos: N/A N/A Left, Lateral Lower Leg N/A N/A Wound Location: Blister N/A N/A Wounding Event: Venous Leg  Ulcer N/A N/A Primary Etiology: Cataracts, Lymphedema, Arrhythmia, N/A N/A Comorbid History: Peripheral Venous Disease, Type II Diabetes, Osteoarthritis, Neuropathy 04/26/2022 N/A N/A Date Acquired: 0 N/A N/A Reese of Treatment: Open N/A N/A Wound Status: No N/A N/A Wound Recurrence: 1.8x0.6x0.1 N/A N/A Measurements L x W x D (cm) 0.848 N/A N/A A (cm) : rea 0.085 N/A N/A Volume (cm) : 0.00% N/A N/A % Reduction in Area: 0.00% N/A N/A % Reduction in Volume: Full Thickness Without Exposed N/A N/A Classification: Support Structures Medium N/A N/A Exudate Amount: Serous N/A N/A Exudate Type: amber N/A N/A Exudate Color: Distinct, outline attached N/A N/A Wound Margin: Large (67-100%) N/A N/A Granulation Amount: Red N/A N/A Granulation Quality: None Present (0%) N/A N/A Necrotic Amount: Fat Layer (Subcutaneous Tissue): Yes N/A N/A Exposed Structures: Fascia: No Tendon: No Muscle: No Joint: No Bone: No None N/A N/A Epithelialization: Compression Therapy N/A N/A Procedures Performed: Treatment Notes Electronic Signature(s) Signed: 06/01/2022 5:08:18 PM By: Kalman Shan DO Entered By: Kalman Shan on 06/01/2022 15:01:03 -------------------------------------------------------------------------------- Multi-Disciplinary Care Plan Details Patient Name: Date of Service: [name], Allen [name] 06/01/2022 1:15 PM Medical Record Number: [id number] Patient Account Number: [id number] Date of Birth/Sex: Treating RN: 10-Sep-1951 (71 y.o. [name] Primary Care Joshua Zeringue: PCP, NO Other Clinician: Referring Rylynn Kobs: Treating Chari Parmenter/Extender: [name] in Treatment: 0 Active Inactive Venous Leg Ulcer Nursing Diagnoses: Actual venous Insuffiency (use after diagnosis is confirmed) Goals: Patient will maintain optimal edema control Date Initiated: 06/01/2022 Target Resolution Date: 06/28/2022 Goal Status:  Active Interventions: Assess peripheral edema status every visit. Compression as ordered Treatment Activities: Therapeutic compression applied : 06/01/2022 Notes: Wound/Skin Impairment Nursing Diagnoses: Impaired tissue integrity Goals: Patient/caregiver will verbalize understanding of skin care regimen Date Initiated: 06/01/2022 Target Resolution Date: 06/28/2022 Goal Status: Active Ulcer/skin breakdown will have a volume reduction of 30% by week 4 Date Initiated: 06/01/2022 Target Resolution Date: 06/28/2022 Goal Status: Active Interventions: Assess patient/caregiver ability to obtain necessary supplies Assess patient/caregiver ability to perform ulcer/skin care regimen upon admission and as needed Assess ulceration(s) every visit Provide education on ulcer and skin care Treatment Activities: Topical wound management initiated : 06/01/2022 Notes: Electronic Signature(s) Signed: 06/01/2022 6:10:01 PM By: [name] Entered By: [name] on 06/01/2022 14:07:35 -------------------------------------------------------------------------------- Pain Assessment Details Patient Name: Date of Service: [name], Allen [name] 06/01/2022 1:15 PM Medical Record Number: [id number] Patient Account Number: [id number] Date of Birth/Sex: Treating RN: 06/27/1951 (71 y.o. [name] Primary Care Wahid Holley: PCP, NO Other Clinician: Referring Moira Umholtz: Treating Melquan Ernsberger/Extender: [name] in Treatment: 0 Active Problems Location of Pain Severity and Description of Pain Patient Has Paino Yes Site Locations Pain Location: Pain in Ulcers With Dressing Change: Yes Duration of the Pain. Constant / Intermittento Intermittent Rate the pain. Current Pain Level: 3 Character of Pain Describe the Pain: Aching, Tender Pain Management and Medication Current Pain Management: Medication: Yes Cold Application: No Rest: Yes Massage: No Activity: No T.E.N.S.:  No Heat Application: No Leg drop or elevation: No Is the Current Pain Management Adequate: Adequate How does your wound impact your activities of daily livingo Sleep: No  Bathing: No Appetite: No Relationship With Others: No Bladder Continence: No Emotions: No Bowel Continence: No Work: No Toileting: No Drive: No Dressing: No Hobbies: No Electronic Signature(s) Signed: 06/01/2022 6:10:01 PM By: [name] Entered By: [name] on 06/01/2022 13:44:06 -------------------------------------------------------------------------------- Patient/Caregiver Education Details Patient Name: Date of Service: [name], Allen NIEL Carlean Jews 6/20/2023andnbsp1:15 PM Medical Record Number: [id number] Patient Account Number: [id number] Date of Birth/Gender: Treating RN: [birth year] (71 y.o. [name] Primary Care Physician: PCP, NO Other Clinician: Referring Physician: Treating Physician/Extender: [name] in Treatment: 0 Education Assessment Education Provided To: Patient and Caregiver Education Topics Provided Venous: Methods: Explain/Verbal, Printed Responses: State content correctly Wound/Skin Impairment: Methods: Demonstration, Explain/Verbal, Printed Responses: State content correctly Electronic Signature(s) Signed: 06/01/2022 6:10:01 PM By: [name] Entered By: [name] on 06/01/2022 14:07:52 -------------------------------------------------------------------------------- Wound Assessment Details Patient Name: Date of Service: [name], Allen [name] 06/01/2022 1:15 PM Medical Record Number: [id number] Patient Account Number: [id number] Date of Birth/Sex: Treating RN: 1951-04-23 (71 y.o. [name] Primary Care Loman Logan: PCP, NO Other Clinician: Referring Shakera Ebrahimi: Treating Yitta Gongaware/Extender: [name] in Treatment: 0 Wound Status Wound Number: 10 Primary Venous Leg Ulcer Etiology: Wound Location:  Right, Lateral Lower Leg Wound Open Wounding Event: Blister Wounding Event: Blister Status: Date Acquired: 04/26/2022 Comorbid Cataracts, Lymphedema, Arrhythmia, Peripheral Venous Disease, Reese Of Treatment: 0 History: Type II Diabetes, Osteoarthritis, Neuropathy Clustered Wound: No Photos Wound Measurements Length: (cm) 5 Width: (cm) 2.5 Depth: (cm) 0.1 Area: (cm) 9.817 Volume: (cm) 0.982 % Reduction in Area: 0% % Reduction in Volume: 0% Epithelialization: None Tunneling: No Undermining: No Wound Description Classification: Full Thickness Without Exposed Support Structures Wound Margin: Distinct, outline attached Exudate Amount: Medium Exudate Type: Serosanguineous Exudate Color: red, brown Foul Odor After Cleansing: No Slough/Fibrino Yes Wound Bed Granulation Amount: Large (67-100%) Exposed Structure Granulation Quality: Red Fascia Exposed: No Necrotic Amount: Small (1-33%) Fat Layer (Subcutaneous Tissue) Exposed: Yes Necrotic Quality: Adherent Slough Tendon Exposed: No Muscle Exposed: No Joint Exposed: No Bone Exposed: No Treatment Notes Wound #10 (Lower Leg) Wound Laterality: Right, Lateral Cleanser Soap and Water Discharge Instruction: May shower and wash wound with dial antibacterial soap and water prior to dressing change. Wound Cleanser Discharge Instruction: Cleanse the wound with wound cleanser prior to applying a clean dressing using gauze sponges, not tissue or cotton balls. Peri-Wound Care Triamcinolone 15 (g) Discharge Instruction: Use triamcinolone 15 (g) as directed Sween Lotion (Moisturizing lotion) Discharge Instruction: Apply moisturizing lotion as directed Topical Gentamicin Discharge Instruction: As directed by physician Mupirocin Ointment Discharge Instruction: Apply Mupirocin (Bactroban) as instructed Primary Dressing Maxorb Extra Calcium Alginate 2x2 in Discharge Instruction: Apply calcium alginate to wound bed as  instructed Secondary Dressing ABD Pad, 8x10 Discharge Instruction: Apply over primary dressing as directed. Secured With Compression Wrap ThreePress (3 layer compression wrap) Discharge Instruction: Apply three layer compression as directed. Compression Stockings Add-Ons Electronic Signature(s) Signed: 06/01/2022 6:10:01 PM By: [name] Entered By: [name] on 06/01/2022 14:02:04 -------------------------------------------------------------------------------- Wound Assessment Details Patient Name: Date of Service: [name], Allen [name] 06/01/2022 1:15 PM Medical Record Number: [id number] Patient Account Number: [id number] Date of Birth/Sex: Treating RN: [birth year] (71 y.o. [name] Primary Care Harriette Tovey: PCP, NO Other Clinician: Referring Franchot Pollitt: Treating Jovanni Eckhart/Extender: [name] in Treatment: 0 Wound Status Wound Number: 11 Primary Venous Leg Ulcer Etiology: Wound Location: Right, Anterior Lower Leg Wound Open Wounding Event: Blister Status: Date Acquired: 04/26/2022 Comorbid Cataracts, Lymphedema,  Arrhythmia, Peripheral Venous Disease, Reese Of Treatment: 0 History: Type II Diabetes, Osteoarthritis, Neuropathy Clustered Wound: No Photos Wound Measurements Length: (cm) 3 Width: (cm) 1.5 Depth: (cm) 0.1 Area: (cm) 3.534 Volume: (cm) 0.353 % Reduction in Area: 0% % Reduction in Volume: 0% Epithelialization: None Tunneling: No Undermining: No Wound Description Classification: Full Thickness Without Exposed Support Structures Wound Margin: Distinct, outline attached Exudate Amount: Medium Exudate Type: Serosanguineous Exudate Color: red, brown Foul Odor After Cleansing: No Slough/Fibrino Yes Wound Bed Granulation Amount: Large (67-100%) Exposed Structure Granulation Quality: Red Fascia Exposed: No Necrotic Amount: Small (1-33%) Fat Layer (Subcutaneous Tissue) Exposed: Yes Necrotic Quality: Adherent  Slough Tendon Exposed: No Muscle Exposed: No Joint Exposed: No Bone Exposed: No Treatment Notes Wound #11 (Lower Leg) Wound Laterality: Right, Anterior Cleanser Soap and Water Discharge Instruction: May shower and wash wound with dial antibacterial soap and water prior to dressing change. Wound Cleanser Discharge Instruction: Cleanse the wound with wound cleanser prior to applying a clean dressing using gauze sponges, not tissue or cotton balls. Peri-Wound Care Triamcinolone 15 (g) Discharge Instruction: Use triamcinolone 15 (g) as directed Sween Lotion (Moisturizing lotion) Discharge Instruction: Apply moisturizing lotion as directed Topical Gentamicin Discharge Instruction: As directed by physician Mupirocin Ointment Discharge Instruction: Apply Mupirocin (Bactroban) as instructed Primary Dressing Maxorb Extra Calcium Alginate 2x2 in Discharge Instruction: Apply calcium alginate to wound bed as instructed Secondary Dressing ABD Pad, 8x10 Discharge Instruction: Apply over primary dressing as directed. Secured With Compression Wrap ThreePress (3 layer compression wrap) Discharge Instruction: Apply three layer compression as directed. Compression Stockings Add-Ons Electronic Signature(s) Signed: 06/01/2022 6:10:01 PM By: [name] Entered By: [name] on 06/01/2022 14:02:37 -------------------------------------------------------------------------------- Wound Assessment Details Patient Name: Date of Service: [name], Allen [name] 06/01/2022 1:15 PM Medical Record Number: [id number] Patient Account Number: [id number] Date of Birth/Sex: Treating RN: 09/19/1951 (71 y.o. [name] Primary Care Delano Scardino: PCP, NO Other Clinician: Referring Brody Kump: Treating Huxley Vanwagoner/Extender: [name] in Treatment: 0 Wound Status Wound Number: 12 Primary Venous Leg Ulcer Etiology: Wound Location: Right, Distal, Anterior Lower Leg Wound  Open Wounding Event: Gradually Appeared Status: Date Acquired: 04/26/2022 Comorbid Cataracts, Lymphedema, Arrhythmia, Peripheral Venous Disease, Reese Of Treatment: 0 History: Type II Diabetes, Osteoarthritis, Neuropathy Clustered Wound: No Photos Wound Measurements Length: (cm) 1 Width: (cm) 2 Depth: (cm) 0.1 Area: (cm) 1.571 Volume: (cm) 0.157 % Reduction in Area: 0% % Reduction in Volume: 0% Epithelialization: None Tunneling: No Undermining: No Wound Description Classification: Full Thickness Without Exposed Support Structures Wound Margin: Distinct, outline attached Exudate Amount: Medium Exudate Type: Serosanguineous Exudate Color: red, brown Foul Odor After Cleansing: No Slough/Fibrino Yes Wound Bed Granulation Amount: Large (67-100%) Exposed Structure Granulation Quality: Red Fascia Exposed: No Necrotic Amount: Small (1-33%) Fat Layer (Subcutaneous Tissue) Exposed: Yes Necrotic Quality: Adherent Slough Tendon Exposed: No Muscle Exposed: No Joint Exposed: No Bone Exposed: No Treatment Notes Wound #12 (Lower Leg) Wound Laterality: Right, Anterior, Distal Cleanser Soap and Water Discharge Instruction: May shower and wash wound with dial antibacterial soap and water prior to dressing change. Wound Cleanser Discharge Instruction: Cleanse the wound with wound cleanser prior to applying a clean dressing using gauze sponges, not tissue or cotton balls. Peri-Wound Care Triamcinolone 15 (g) Discharge Instruction: Use triamcinolone 15 (g) as directed Sween Lotion (Moisturizing lotion) Discharge Instruction: Apply moisturizing lotion as directed Topical Gentamicin Discharge Instruction: As directed by physician Mupirocin Ointment Discharge Instruction: Apply Mupirocin (Bactroban) as instructed Primary Dressing Maxorb Extra Calcium Alginate 2x2 in  Discharge Instruction: Apply calcium alginate to wound bed as instructed Secondary Dressing ABD Pad, 8x10 Discharge  Instruction: Apply over primary dressing as directed. Secured With Compression Wrap ThreePress (3 layer compression wrap) Discharge Instruction: Apply three layer compression as directed. Compression Stockings Add-Ons Electronic Signature(s) Signed: 06/01/2022 6:10:01 PM By: [name] Entered By: [name] on 06/01/2022 14:03:07 -------------------------------------------------------------------------------- Wound Assessment Details Patient Name: Date of Service: [name], Allen [name] 06/01/2022 1:15 PM Medical Record Number: [id number] Patient Account Number: [id number] Date of Birth/Sex: Treating RN: [birth year] (71 y.o. [name] Primary Care Harryette Shuart: PCP, NO Other Clinician: Referring Kysa Calais: Treating Marlyss Cissell/Extender: [name] in Treatment: 0 Wound Status Wound Number: 13 Primary Venous Leg Ulcer Etiology: Wound Location: Left, Lateral Lower Leg Wound Open Wounding Event: Blister Status: Date Acquired: 04/26/2022 Comorbid Cataracts, Lymphedema, Arrhythmia, Peripheral Venous Disease, Reese Of Treatment: 0 History: Type II Diabetes, Osteoarthritis, Neuropathy Clustered Wound: No Photos Wound Measurements Length: (cm) 1.8 Width: (cm) 0.6 Depth: (cm) 0.1 Area: (cm) 0.848 Volume: (cm) 0.085 % Reduction in Area: 0% % Reduction in Volume: 0% Epithelialization: None Tunneling: No Undermining: No Wound Description Classification: Full Thickness Without Exposed Support Structures Wound Margin: Distinct, outline attached Exudate Amount: Medium Exudate Type: Serous Exudate Color: amber Foul Odor After Cleansing: No Slough/Fibrino No Wound Bed Granulation Amount: Large (67-100%) Exposed Structure Granulation Quality: Red Fascia Exposed: No Necrotic Amount: None Present (0%) Fat Layer (Subcutaneous Tissue) Exposed: Yes Tendon Exposed: No Muscle Exposed: No Joint Exposed: No Bone Exposed: No Treatment Notes Wound #13  (Lower Leg) Wound Laterality: Left, Lateral Cleanser Soap and Water Discharge Instruction: May shower and wash wound with dial antibacterial soap and water prior to dressing change. Wound Cleanser Discharge Instruction: Cleanse the wound with wound cleanser prior to applying a clean dressing using gauze sponges, not tissue or cotton balls. Peri-Wound Care Triamcinolone 15 (g) Discharge Instruction: Use triamcinolone 15 (g) as directed Sween Lotion (Moisturizing lotion) Discharge Instruction: Apply moisturizing lotion as directed Topical Gentamicin Discharge Instruction: As directed by physician Mupirocin Ointment Discharge Instruction: Apply Mupirocin (Bactroban) as instructed Primary Dressing Maxorb Extra Calcium Alginate 2x2 in Discharge Instruction: Apply calcium alginate to wound bed as instructed Secondary Dressing ABD Pad, 8x10 Discharge Instruction: Apply over primary dressing as directed. Secured With Compression Wrap ThreePress (3 layer compression wrap) Discharge Instruction: Apply three layer compression as directed. Compression Stockings Add-Ons Electronic Signature(s) Signed: 06/01/2022 6:10:01 PM By: [name] Entered By: [name] on 06/01/2022 14:03:43 -------------------------------------------------------------------------------- Vitals Details Patient Name: Date of Service: [name], Allen [name] 06/01/2022 1:15 PM Medical Record Number: [id number] Patient Account Number: [id number] Date of Birth/Sex: Treating RN: 17-Sep-1951 (71 y.o. [name] Primary Care Willard Madrigal: PCP, NO Other Clinician: Referring Lexys Milliner: Treating Cortnie Ringel/Extender: [name] in Treatment: 0 Vital Signs Time Taken: 13:40 Temperature (F): 98.3 Pulse (bpm): 82 Respiratory Rate (breaths/min): 18 Blood Pressure (mmHg): 114/67 Reference Range: 80 - 120 mg / dl Electronic Signature(s) Signed: 06/01/2022 6:10:01 PM By: [name] Entered  By: [name] on 06/01/2022 13:43:06

## 2022-06-01 NOTE — Progress Notes (Signed)
[name] ([id number]) Visit Report for 06/01/2022 Chief Complaint Document Details Patient Name: Date of Service: [name] 06/01/2022 1:15 PM Medical Record Number: [id number] Patient Account Number: [id number] Date of Birth/Sex: Treating RN: 03/15/1951 (71 y.o. M) Primary Care Provider: PCP, NO Other Clinician: Referring Provider: Treating Provider/Extender: [name]: 0 Information Obtained from: Patient Chief Complaint 02/03/2022: The patient is here today for reevaluation of bilateral lower extremity edema and venous stasis dermatitis Electronic Signature(s) Signed: 06/01/2022 5:08:18 PM By: Kalman Shan DO Entered By: Kalman Shan on 06/01/2022 15:03:27 -------------------------------------------------------------------------------- HPI Details Patient Name: Date of Service: [name], [name] 06/01/2022 1:15 PM Medical Record Number: [id number] Patient Account Number: [id number] Date of Birth/Sex: Treating RN: 1951/07/26 (71 y.o. M) Primary Care Provider: PCP, NO Other Clinician: Referring Provider: Treating Provider/Extender: [name]: 0 History of Present Illness HPI Description: [name] is a 71 year old male with a past medical history of type 2 diabetes and essential hypertension who presents to the clinic today for bilateral lower extremity wounds. He was recently seen in the ED on 3/9 for bilateral lower extremity edema. He had a DVT study that was negative for clots. He was given triamcinolone cream and advised to elevate legs and obtain compression stockings. He has been using the triamcinolone cream however he is unable to use compression stockings due to difficulty of putting on. Patient states that for the past 2 years he has had opening and closing of weeping wounds to his legs bilaterally. They spontaneously open and close. They are often open for several  weeks before they heal spontaneously. He denies any purulent drainage, increased warmth or increased erythema to the skin. 4/21; patient presents for 1 week follow-up. The wounds on the right leg have healed however he has now 2 new wounds 1 on the anterior and posterior thigh that occurred from him scratching underneath the wrap. The left leg wound has improved and almost closed 4/28; patient presents for 1 week follow-up. He continues to have a wound on the right anterior leg that is showing signs of healing. The posterior right thigh has closed. He now has a new wound to the left leg that is limited to skin breakdown. 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 The wounds are occurring because he goes underneath the leg wraps and scratches creating new wounds. He has no complaints today. 5/5; patient presents for 1 week follow-up. He no longer has any open wounds to his lower extremities bilaterally. He continues to have scattered excoriation marks from where he scratches. These are scabbed over. He states he is going to see dermatology this month. Overall he is doing well and is happy with his care. Readmission 6/10 Patient was followed for open wounds limited to skin breakdown to his lower extremities bilaterally 2/2 venous insufficiency. Once the wounds healed he was discharged with juxta lite compressions and these were placed in office. He was discharged on 5/5. He states that he has not taken the wraps off since discharge and has had them on for over a month. He did not want to take them off because they were very comfortable. He does not have any difficulty putting these on and off if he wanted to. He states that he does not take a shower and just cleans the other parts of his body at the sink. He  noticed that 2 days ago he was having some drainage at the top of the wrap and called in to be evaluated. He states that he has  not seen the wounds until the wraps were taken off today in office. He denies pain or increased warmth or erythema to the area 6/27; patient presents for 2-week follow-up. He has had increased swelling and drainage to the wounds over the last week. He reports increased erythema to the area. He does not have pain however. He denies purulent drainage or fever/chills. 7/5; patient presents for 1 week follow-up. He was prescribed Keflex at last clinic visit and is currently taking this. He reports significant improvement to his leg swelling, tenderness and erythema. He denies signs of infection. 7/21; patient presents for 2-week follow-up. He reports improvement in healing in his right lower extremity. Unfortunately this morning he noticed skin breakdown to his left lower extremity with increased redness and warmth. 7/28; patient presents for 1 week follow-up. He reports completing his antibiotics. He states that his wounds are healed. He has been using his juxta light compressions. He has no issues or complaints today. Readmission 12/17/2021; This is a 71 year old man who has been seen twice by Dr. Heber Reese for wounds on his bilateral lower extremities secondary to chronic venous insufficiency. Most recently this was from 05/22/2021 through 07/09/2021 with bilateral lower extremity wounds largely secondary to chronic venous insufficiency with stasis dermatitis. He has been discharged with juxta lite stockings. The patient tells me that about a month ago he developed too much edema to get his juxta lites on. He developed increased swelling. He saw his primary physician who gave him Lasix and the swelling seems to have come down. 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 He says this dates back into his adolescence and he could not walk barefoot etc. As a result of this he sometimes leaves his shoes and socks on for weeks and he is done that recently.[name] He  arrives in clinic with not any wounds on his legs however with close inspection he had a fairly deep wound in the left second and third toe webspace His skin on his feet was not in good shape macerated flaking malodorous The patient's past medical history has not changed. I note that he had arterial studies done on 06/03/2021 that showed an ABI on the right of 1.26 with triphasic waveforms TBI of 0.63 on the right on the left ABI of 0.85 TBI of 0.63 again with triphasic waveforms 1/19; since the patient was last here he was seen in the hospital for bradycardia. He had his medications adjusted digoxin metoprolol. He tells me they dressed the wound between the left second and third toe. The patient says that he had open wounds on his dorsal foot when we took off the dressing today. I was not exactly sure what he had on their. He had 2 wounds on the left dorsal foot with some significant surrounding erythema.The wound between the left second and third toe was just about closed however when we opened it to examine it today it it opened and a small area. The skin on his lower legs especially the left looks a lot better using triamcinolone and Cetaphil which she is applying He was on cephalexin prior described I think by primary care before he came here he completed this about 3 or 4 days ago 1/26; the area between the left second and third toes is healed.  He still has 2 small areas on the left dorsal foot. The erythema that I marked around this last week seems better. He is completing his doxycycline. I also gave him triamcinolone and Cetaphil last week for the dry flaking irritated inflamed skin on his dorsal feet and lower legs this all seems better. He has been wearing his juxta lite stockings 2/2; everything is healed here. He is now treating his skin with I think both the triamcinolone and Cetaphil mixture that I prescribed and probably pure triamcinolone that the dermatologist prescribed. I told him  to use my compounded steroid cream as a moisture and anti-itch and inflammation cream when he is finished without to go to a pure moisturizer I emphasized that steroids under compression even if its a juxta lite stocking can get absorbed and secondarily can cause skin atrophy. I asked him not to use both. On an optimistic front he seems a lot more interested in treating his skin and watching for areas on his feet that are breaking down. His skin looks a lot better he has a juxta lite stocking 2/14; patient we discharged 2 weeks ago. He has noted increasing edema in the right greater than left leg erythema and intense pruritus. Arrives in clinic really with no open wound however considerable amount of marks on his upper right leg from scratching. He claims compliance with his juxta lites up with up to a week ago on the right. He has been wearing 1 on the left. He has home health nurse put kerlix on this 02/03/2022: No open wounds. He has been in 3 layer compression, which is slipped down and caused some "mushrooming" affect from just below the tibial tuberosity to the knee. The excoriations on his legs have resolved. He reports being compliant with moisturizing and elevation of his legs. He saw his cardiologist yesterday and was prescribed 20 mg of Lasix to be used on an as-needed basis. Readmission 06/01/2022 [name] is a 71 year old male with a past medical history of venous insufficiency, lymphedema, type 2 diabetes that presents to the clinic for a 1 month history of worsening leg wounds. He states he has not been wearing his compression wraps for the past 2 months. He states his Lasix was stopped by his primary doctor and then his legs became more swollen and he could not wear his compression stockings. He states eventually he was put back on Lasix. He still has been unable to wear his compression stockings and although his swelling has improved slightly his wounds have not improved. He has  increased warmth and erythema to the right lower extremity. Electronic Signature(s) Signed: 06/01/2022 5:08:18 PM By: Kalman Shan DO Entered By: Kalman Shan on 06/01/2022 15:05:47 -------------------------------------------------------------------------------- Physical Exam Details Patient Name: Date of Service: [name], [name] 06/01/2022 1:15 PM Medical Record Number: [id number] Patient Account Number: [id number] Date of Birth/Sex: Treating RN: 01/20/1951 (71 y.o. M) Primary Care Provider: PCP, NO Other Clinician: Referring Provider: Treating Provider/Extender: [name]: 0 Constitutional respirations regular, non-labored and within target range for patient.. Cardiovascular 2+ dorsalis pedis/posterior tibialis pulses. Psychiatric pleasant and cooperative. Notes Bilateral lower extremity wounds. 2+ pitting edema to the legs right greater than left. Increased warmth and erythema to the right leg. Electronic Signature(s) Signed: 06/01/2022 5:08:18 PM By: Kalman Shan DO Entered By: Kalman Shan on 06/01/2022 15:06:25 -------------------------------------------------------------------------------- Physician Orders Details Patient Name: Date of Service: BRO Lynnae January, [name] 06/01/2022 1:15 PM Medical Record  Number: [id number] Patient Account Number: [id number] Date of Birth/Sex: Treating RN: Mar 11, 1951 (71 y.o. [name] Primary Care Provider: PCP, NO Other Clinician: Referring Provider: Treating Provider/Extender: [name]: 0 Verbal / Phone Orders: No Diagnosis Coding ICD-10 Coding Code Description [phone] Non-pressure chronic ulcer of other part of right lower leg with fat layer exposed L97.822 Non-pressure chronic ulcer of other part of left lower leg with fat layer exposed I87.313 Chronic venous hypertension (idiopathic) with ulcer of bilateral lower extremity I89.0  Lymphedema, not elsewhere classified E11.622 Type 2 diabetes mellitus with other skin ulcer Follow-up Appointments ppointment in 1 week. - 06/10/22 @ 8:45am with Dr. Heber Kimbolton and Leveda Anna, RN (Room 7) Return A Other: - Prescription given for Keflex Bathing/ Shower/ Hygiene May shower with protection but do not get wound dressing(s) wet. - may use cast bag Edema Control - Lymphedema / SCD / Other Elevate legs to the level of the heart or above for 30 minutes daily and/or when sitting, a frequency of: Avoid standing for long periods of time. Wound Treatment Wound #10 - Lower Leg Wound Laterality: Right, Lateral Cleanser: Soap and Water 1 x Per Week/30 Days Discharge Instructions: May shower and wash wound with dial antibacterial soap and water prior to dressing change. Cleanser: Wound Cleanser 1 x Per Week/30 Days Discharge Instructions: Cleanse the wound with wound cleanser prior to applying a clean dressing using gauze sponges, not tissue or cotton balls. Peri-Wound Care: Triamcinolone 15 (g) 1 x Per Week/30 Days Discharge Instructions: Use triamcinolone 15 (g) as directed Peri-Wound Care: Sween Lotion (Moisturizing lotion) 1 x Per Week/30 Days Discharge Instructions: Apply moisturizing lotion as directed Topical: Gentamicin 1 x Per Week/30 Days Discharge Instructions: As directed by physician Topical: Mupirocin Ointment 1 x Per Week/30 Days Discharge Instructions: Apply Mupirocin (Bactroban) as instructed Prim Dressing: Maxorb Extra Calcium Alginate 2x2 in 1 x Per Week/30 Days ary Discharge Instructions: Apply calcium alginate to wound bed as instructed Secondary Dressing: ABD Pad, 8x10 1 x Per Week/30 Days Discharge Instructions: Apply over primary dressing as directed. Compression Wrap: ThreePress (3 layer compression wrap) 1 x Per Week/30 Days Discharge Instructions: Apply three layer compression as directed. Wound #11 - Lower Leg Wound Laterality: Right, Anterior Cleanser: Soap and  Water 1 x Per Week/30 Days Discharge Instructions: May shower and wash wound with dial antibacterial soap and water prior to dressing change. Cleanser: Wound Cleanser 1 x Per Week/30 Days Discharge Instructions: Cleanse the wound with wound cleanser prior to applying a clean dressing using gauze sponges, not tissue or cotton balls. Peri-Wound Care: Triamcinolone 15 (g) 1 x Per Week/30 Days Discharge Instructions: Use triamcinolone 15 (g) as directed Peri-Wound Care: Sween Lotion (Moisturizing lotion) 1 x Per Week/30 Days Discharge Instructions: Apply moisturizing lotion as directed Topical: Gentamicin 1 x Per Week/30 Days Discharge Instructions: As directed by physician Topical: Mupirocin Ointment 1 x Per Week/30 Days Discharge Instructions: Apply Mupirocin (Bactroban) as instructed Prim Dressing: Maxorb Extra Calcium Alginate 2x2 in 1 x Per Week/30 Days ary Discharge Instructions: Apply calcium alginate to wound bed as instructed Secondary Dressing: ABD Pad, 8x10 1 x Per Week/30 Days Discharge Instructions: Apply over primary dressing as directed. Compression Wrap: ThreePress (3 layer compression wrap) 1 x Per Week/30 Days Discharge Instructions: Apply three layer compression as directed. Wound #12 - Lower Leg Wound Laterality: Right, Anterior, Distal Cleanser: Soap and Water 1 x Per Week/30 Days Discharge Instructions: May shower and wash wound with dial antibacterial soap and water  prior to dressing change. Cleanser: Wound Cleanser 1 x Per Week/30 Days Discharge Instructions: Cleanse the wound with wound cleanser prior to applying a clean dressing using gauze sponges, not tissue or cotton balls. Peri-Wound Care: Triamcinolone 15 (g) 1 x Per Week/30 Days Discharge Instructions: Use triamcinolone 15 (g) as directed Peri-Wound Care: Sween Lotion (Moisturizing lotion) 1 x Per Week/30 Days Discharge Instructions: Apply moisturizing lotion as directed Topical: Gentamicin 1 x Per Week/30  Days Discharge Instructions: As directed by physician Topical: Mupirocin Ointment 1 x Per Week/30 Days Discharge Instructions: Apply Mupirocin (Bactroban) as instructed Prim Dressing: Maxorb Extra Calcium Alginate 2x2 in 1 x Per Week/30 Days ary Discharge Instructions: Apply calcium alginate to wound bed as instructed Secondary Dressing: ABD Pad, 8x10 1 x Per Week/30 Days Discharge Instructions: Apply over primary dressing as directed. Compression Wrap: ThreePress (3 layer compression wrap) 1 x Per Week/30 Days Discharge Instructions: Apply three layer compression as directed. Wound #13 - Lower Leg Wound Laterality: Left, Lateral Cleanser: Soap and Water 1 x Per Week/30 Days Discharge Instructions: May shower and wash wound with dial antibacterial soap and water prior to dressing change. Cleanser: Wound Cleanser 1 x Per Week/30 Days Discharge Instructions: Cleanse the wound with wound cleanser prior to applying a clean dressing using gauze sponges, not tissue or cotton balls. Peri-Wound Care: Triamcinolone 15 (g) 1 x Per Week/30 Days Discharge Instructions: Use triamcinolone 15 (g) as directed Peri-Wound Care: Sween Lotion (Moisturizing lotion) 1 x Per Week/30 Days Discharge Instructions: Apply moisturizing lotion as directed Topical: Gentamicin 1 x Per Week/30 Days Discharge Instructions: As directed by physician Topical: Mupirocin Ointment 1 x Per Week/30 Days Discharge Instructions: Apply Mupirocin (Bactroban) as instructed Prim Dressing: Maxorb Extra Calcium Alginate 2x2 in 1 x Per Week/30 Days ary Discharge Instructions: Apply calcium alginate to wound bed as instructed Secondary Dressing: ABD Pad, 8x10 1 x Per Week/30 Days Discharge Instructions: Apply over primary dressing as directed. Compression Wrap: ThreePress (3 layer compression wrap) 1 x Per Week/30 Days Discharge Instructions: Apply three layer compression as directed. Patient Medications llergies: No Known  Allergies A Notifications Medication Indication Start End 06/01/2022 cephalexin DOSE 1 - oral 500 mg tablet - 1 tablet oral BID x 10 days Electronic Signature(s) Signed: 06/01/2022 5:08:18 PM By: Kalman Shan DO Previous Signature: 06/01/2022 2:33:15 PM Version By: Kalman Shan DO Entered By: Kalman Shan on 06/01/2022 15:06:35 -------------------------------------------------------------------------------- Problem List Details Patient Name: Date of Service: [name], [name] 06/01/2022 1:15 PM Medical Record Number: [id number] Patient Account Number: [id number] Date of Birth/Sex: Treating RN: [birth year] (71 y.o. M) Primary Care Provider: PCP, NO Other Clinician: Referring Provider: Treating Provider/Extender: [name]: 0 Active Problems ICD-10 Encounter Code Description Active Date MDM Diagnosis L97.812 Non-pressure chronic ulcer of other part of right lower leg with fat layer 06/01/2022 No Yes exposed L97.822 Non-pressure chronic ulcer of other part of left lower leg with fat layer exposed6/20/2023 No Yes I87.313 Chronic venous hypertension (idiopathic) with ulcer of bilateral lower extremity 06/01/2022 No Yes I89.0 Lymphedema, not elsewhere classified 06/01/2022 No Yes E11.622 Type 2 diabetes mellitus with other skin ulcer 06/01/2022 No Yes Inactive Problems Resolved Problems Electronic Signature(s) Signed: 06/01/2022 5:08:18 PM By: Kalman Shan DO Entered By: Kalman Shan on 06/01/2022 15:00:53 -------------------------------------------------------------------------------- Progress Note Details Patient Name: Date of Service: [name], [name] 06/01/2022 1:15 PM Medical Record Number: [id number] Patient Account Number: [id number] Date of Birth/Sex: Treating RN: 08-May-1951 (71 y.o. M) Primary Care  Provider: PCP, NO Other Clinician: Referring Provider: Treating Provider/Extender: [name]  in Treatment: 0 Subjective Chief Complaint Information obtained from Patient 02/03/2022: The patient is here today for reevaluation of bilateral lower extremity edema and venous stasis dermatitis History of Present Illness (HPI) Navi Erber is a 71 year old male with a past medical history of type 2 diabetes and essential hypertension who presents to the clinic today for bilateral lower extremity wounds. He was recently seen in the ED on 3/9 for bilateral lower extremity edema. He had a DVT study that was negative for clots. He was given triamcinolone cream and advised to elevate legs and obtain compression stockings. He has been using the triamcinolone cream however he is unable to use compression stockings due to difficulty of putting on. Patient states that for the past 2 years he has had opening and closing of weeping wounds to his legs bilaterally. They spontaneously open and close. They are often open for several weeks before they heal spontaneously. He denies any purulent drainage, increased warmth or increased erythema to the skin. 4/21; patient presents for 1 week follow-up. The wounds on the right leg have healed however he has now 2 new wounds 1 on the anterior and posterior thigh that occurred from him scratching underneath the wrap. The left leg wound has improved and almost closed 4/28; patient presents for 1 week follow-up. He continues to have a wound on the right anterior leg that is showing signs of healing. The posterior right thigh has closed. He now has a new wound to the left leg that is limited to skin breakdown. 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 The wounds are occurring because he goes underneath the leg wraps and scratches creating new wounds. He has no complaints today. 5/5; patient presents for 1 week follow-up. He no longer has any open wounds to his lower extremities bilaterally. He  continues to have scattered excoriation marks from where he scratches. These are scabbed over. He states he is going to see dermatology this month. Overall he is doing well and is happy with his care. Readmission 6/10 Patient was followed for open wounds limited to skin breakdown to his lower extremities bilaterally 2/2 venous insufficiency. Once the wounds healed he was discharged with juxta lite compressions and these were placed in office. He was discharged on 5/5. He states that he has not taken the wraps off since discharge and has had them on for over a month. He did not want to take them off because they were very comfortable. He does not have any difficulty putting these on and off if he wanted to. He states that he does not take a shower and just cleans the other parts of his body at the sink. He noticed that 2 days ago he was having some drainage at the top of the wrap and called in to be evaluated. He states that he has not seen the wounds until the wraps were taken off today in office. He denies pain or increased warmth or erythema to the area 6/27; patient presents for 2-week follow-up. He has had increased swelling and drainage to the wounds over the last week. He reports increased erythema to the area. He does not have pain however. He denies purulent drainage or fever/chills. 7/5; patient presents for 1 week follow-up. He was prescribed Keflex at last clinic visit and is currently taking this. He reports significant improvement  to his leg swelling, tenderness and erythema. He denies signs of infection. 7/21; patient presents for 2-week follow-up. He reports improvement in healing in his right lower extremity. Unfortunately this morning he noticed skin breakdown to his left lower extremity with increased redness and warmth. 7/28; patient presents for 1 week follow-up. He reports completing his antibiotics. He states that his wounds are healed. He has been using his juxta  light compressions. He has no issues or complaints today. Readmission 12/17/2021; This is a 71 year old man who has been seen twice by Dr. Heber Walden for wounds on his bilateral lower extremities secondary to chronic venous insufficiency. Most recently this was from 05/22/2021 through 07/09/2021 with bilateral lower extremity wounds largely secondary to chronic venous insufficiency with stasis dermatitis. He has been discharged with juxta lite stockings. The patient tells me that about a month ago he developed too much edema to get his juxta lites on. He developed increased swelling. He saw his primary physician who gave him Lasix and the swelling seems to have come down. 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 He says this dates back into his adolescence and he could not walk barefoot etc. As a result of this he sometimes leaves his shoes and socks on for weeks and he is done that recently.[name] He arrives in clinic with not any wounds on his legs however with close inspection he had a fairly deep wound in the left second and third toe webspace His skin on his feet was not in good shape macerated flaking malodorous The patient's past medical history has not changed. I note that he had arterial studies done on 06/03/2021 that showed an ABI on the right of 1.26 with triphasic waveforms TBI of 0.63 on the right on the left ABI of 0.85 TBI of 0.63 again with triphasic waveforms 1/19; since the patient was last here he was seen in the hospital for bradycardia. He had his medications adjusted digoxin metoprolol. He tells me they dressed the wound between the left second and third toe. The patient says that he had open wounds on his dorsal foot when we took off the dressing today. I was not exactly sure what he had on their. He had 2 wounds on the left dorsal foot with some significant surrounding erythema.The wound between the left second and third toe was just  about closed however when we opened it to examine it today it it opened and a small area. The skin on his lower legs especially the left looks a lot better using triamcinolone and Cetaphil which she is applying He was on cephalexin prior described I think by primary care before he came here he completed this about 3 or 4 days ago 1/26; the area between the left second and third toes is healed. He still has 2 small areas on the left dorsal foot. The erythema that I marked around this last week seems better. He is completing his doxycycline. I also gave him triamcinolone and Cetaphil last week for the dry flaking irritated inflamed skin on his dorsal feet and lower legs this all seems better. He has been wearing his juxta lite stockings 2/2; everything is healed here. He is now treating his skin with I think both the triamcinolone and Cetaphil mixture that I prescribed and probably pure triamcinolone that the dermatologist prescribed. I told him to use my compounded steroid cream as a moisture and anti-itch and inflammation cream when he is finished  without to go to a pure moisturizer I emphasized that steroids under compression even if its a juxta lite stocking can get absorbed and secondarily can cause skin atrophy. I asked him not to use both. On an optimistic front he seems a lot more interested in treating his skin and watching for areas on his feet that are breaking down. His skin looks a lot better he has a juxta lite stocking 2/14; patient we discharged 2 weeks ago. He has noted increasing edema in the right greater than left leg erythema and intense pruritus. Arrives in clinic really with no open wound however considerable amount of marks on his upper right leg from scratching. He claims compliance with his juxta lites up with up to a week ago on the right. He has been wearing 1 on the left. He has home health nurse put kerlix on this 02/03/2022: No open wounds. He has been in 3 layer  compression, which is slipped down and caused some "mushrooming" affect from just below the tibial tuberosity to the knee. The excoriations on his legs have resolved. He reports being compliant with moisturizing and elevation of his legs. He saw his cardiologist yesterday and was prescribed 20 mg of Lasix to be used on an as-needed basis. Readmission 06/01/2022 [name] is a 71 year old male with a past medical history of venous insufficiency, lymphedema, type 2 diabetes that presents to the clinic for a 1 month history of worsening leg wounds. He states he has not been wearing his compression wraps for the past 2 months. He states his Lasix was stopped by his primary doctor and then his legs became more swollen and he could not wear his compression stockings. He states eventually he was put back on Lasix. He still has been unable to wear his compression stockings and although his swelling has improved slightly his wounds have not improved. He has increased warmth and erythema to the right lower extremity. Patient History Unable to Obtain Patient History due to Altered Mental Status. Information obtained from Patient. Allergies No Known Allergies Family History Cancer - Mother,Maternal Grandparents, Heart Disease - Maternal Grandparents, Hypertension - Maternal Grandparents, No family history of Diabetes, Hereditary Spherocytosis. Social History Former smoker - ended on 09/14/1983, Marital Status - Single, Alcohol Use - Rarely, Drug Use - No History, Caffeine Use - Never. Medical History Eyes Patient has history of Cataracts - 2014 Hematologic/Lymphatic Patient has history of Lymphedema Cardiovascular Patient has history of Arrhythmia - A-Fib, Peripheral Venous Disease Endocrine Patient has history of Type II Diabetes Musculoskeletal Patient has history of Osteoarthritis Neurologic Patient has history of Neuropathy Denies history of Quadriplegia, Paraplegia, Seizure  Disorder Oncologic Denies history of Received Chemotherapy, Received Radiation Psychiatric Denies history of Anorexia/bulimia, Confinement Anxiety Hospitalization/Surgery History - colon resection. - appendectomy. - multiple tooth extraction. Medical A Surgical History Notes nd Hematologic/Lymphatic Hyperbilirubinemia Gastrointestinal Rectal Pain-Constipation Genitourinary CKD stage 3-BPH Oncologic Colon Cancer-Colon Re-section Review of Systems (ROS) Eyes Complains or has symptoms of Glasses / Contacts. Objective Constitutional respirations regular, non-labored and within target range for patient.. Vitals Time Taken: 1:40 PM, Temperature: 98.3 F, Pulse: 82 bpm, Respiratory Rate: 18 breaths/min, Blood Pressure: 114/67 mmHg. Cardiovascular 2+ dorsalis pedis/posterior tibialis pulses. Psychiatric pleasant and cooperative. General Notes: Bilateral lower extremity wounds. 2+ pitting edema to the legs right greater than left. Increased warmth and erythema to the right leg. Integumentary (Hair, Skin) Wound #10 status is Open. Original cause of wound was Blister. The date acquired was: 04/26/2022. The wound is  located on the Right,Lateral Lower Leg. The wound measures 5cm length x 2.5cm width x 0.1cm depth; 9.817cm^2 area and 0.982cm^3 volume. There is Fat Layer (Subcutaneous Tissue) exposed. There is no tunneling or undermining noted. There is a medium amount of serosanguineous drainage noted. The wound margin is distinct with the outline attached to the wound base. There is large (67-100%) red granulation within the wound bed. There is a small (1-33%) amount of necrotic tissue within the wound bed including Adherent Slough. Wound #11 status is Open. Original cause of wound was Blister. The date acquired was: 04/26/2022. The wound is located on the Right,Anterior Lower Leg. The wound measures 3cm length x 1.5cm width x 0.1cm depth; 3.534cm^2 area and 0.353cm^3 volume. There is Fat Layer  (Subcutaneous Tissue) exposed. There is no tunneling or undermining noted. There is a medium amount of serosanguineous drainage noted. The wound margin is distinct with the outline attached to the wound base. There is large (67-100%) red granulation within the wound bed. There is a small (1-33%) amount of necrotic tissue within the wound bed including Adherent Slough. Wound #12 status is Open. Original cause of wound was Gradually Appeared. The date acquired was: 04/26/2022. The wound is located on the Right,Distal,Anterior Lower Leg. The wound measures 1cm length x 2cm width x 0.1cm depth; 1.571cm^2 area and 0.157cm^3 volume. There is Fat Layer (Subcutaneous Tissue) exposed. There is no tunneling or undermining noted. There is a medium amount of serosanguineous drainage noted. The wound margin is distinct with the outline attached to the wound base. There is large (67-100%) red granulation within the wound bed. There is a small (1-33%) amount of necrotic tissue within the wound bed including Adherent Slough. Wound #13 status is Open. Original cause of wound was Blister. The date acquired was: 04/26/2022. The wound is located on the Left,Lateral Lower Leg. The wound measures 1.8cm length x 0.6cm width x 0.1cm depth; 0.848cm^2 area and 0.085cm^3 volume. There is Fat Layer (Subcutaneous Tissue) exposed. There is no tunneling or undermining noted. There is a medium amount of serous drainage noted. The wound margin is distinct with the outline attached to the wound base. There is large (67-100%) red granulation within the wound bed. There is no necrotic tissue within the wound bed. 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 He was not able to wear his compression socks for the past 2 months. At this time I recommended silver alginate with some mupirocin/gentamicin to address any bioburden to the wound beds. We will use a 3 layer compression bilaterally. I cannot rule out cellulitis and will give him Cephalexin for the right lower extremity. Procedures Wound #10 Pre-procedure diagnosis of Wound #10 is a Venous Leg Ulcer located on the Right,Lateral Lower Leg . There was a Three Layer Compression Therapy Procedure by BLorrin Jackson RN. Post procedure Diagnosis Wound #10: Same as Pre-Procedure Wound #11 Pre-procedure diagnosis of Wound #11 is a Venous Leg Ulcer located on the Right,Anterior Lower Leg . There was a Three Layer Compression Therapy Procedure by BLorrin Jackson RN. Post procedure Diagnosis Wound #11: Same as Pre-Procedure Wound #12 Pre-procedure diagnosis of Wound #12 is  a Venous Leg Ulcer located on the Right,Distal,Anterior Lower Leg . There was a Three Layer Compression Therapy Procedure by Lorrin Jackson, RN. Post procedure Diagnosis Wound #12: Same as Pre-Procedure Wound #13 Pre-procedure diagnosis of Wound #13 is a Venous Leg Ulcer located on the Left,Lateral Lower Leg . There was a Three Layer Compression Therapy Procedure by Lorrin Jackson, RN. Post procedure Diagnosis Wound #13: Same as Pre-Procedure Plan Follow-up Appointments: Return Appointment in 1 week. - 06/10/22 @ 8:45am with Dr. Heber Santa Cruz and Leveda Anna, RN (Room 7) Other: - Prescription given for Keflex Bathing/ Shower/ Hygiene: May shower with protection but do not get wound dressing(s) wet. - may use cast bag Edema Control - Lymphedema / SCD / Other: Elevate legs to the level of the heart or above for 30 minutes daily and/or when sitting, a frequency of: Avoid standing for long periods of time. The  following medication(s) was prescribed: cephalexin oral 500 mg tablet 1 1 tablet oral BID x 10 days starting 06/01/2022 WOUND #10: - Lower Leg Wound Laterality: Right, Lateral Cleanser: Soap and Water 1 x Per Week/30 Days Discharge Instructions: May shower and wash wound with dial antibacterial soap and water prior to dressing change. Cleanser: Wound Cleanser 1 x Per Week/30 Days Discharge Instructions: Cleanse the wound with wound cleanser prior to applying a clean dressing using gauze sponges, not tissue or cotton balls. Peri-Wound Care: Triamcinolone 15 (g) 1 x Per Week/30 Days Discharge Instructions: Use triamcinolone 15 (g) as directed Peri-Wound Care: Sween Lotion (Moisturizing lotion) 1 x Per Week/30 Days Discharge Instructions: Apply moisturizing lotion as directed Topical: Gentamicin 1 x Per Week/30 Days Discharge Instructions: As directed by physician Topical: Mupirocin Ointment 1 x Per Week/30 Days Discharge Instructions: Apply Mupirocin (Bactroban) as instructed Prim Dressing: Maxorb Extra Calcium Alginate 2x2 in 1 x Per Week/30 Days ary Discharge Instructions: Apply calcium alginate to wound bed as instructed Secondary Dressing: ABD Pad, 8x10 1 x Per Week/30 Days Discharge Instructions: Apply over primary dressing as directed. Com pression Wrap: ThreePress (3 layer compression wrap) 1 x Per Week/30 Days Discharge Instructions: Apply three layer compression as directed. WOUND #11: - Lower Leg Wound Laterality: Right, Anterior Cleanser: Soap and Water 1 x Per Week/30 Days Discharge Instructions: May shower and wash wound with dial antibacterial soap and water prior to dressing change. Cleanser: Wound Cleanser 1 x Per Week/30 Days Discharge Instructions: Cleanse the wound with wound cleanser prior to applying a clean dressing using gauze sponges, not tissue or cotton balls. Peri-Wound Care: Triamcinolone 15 (g) 1 x Per Week/30 Days Discharge Instructions: Use triamcinolone 15 (g)  as directed Peri-Wound Care: Sween Lotion (Moisturizing lotion) 1 x Per Week/30 Days Discharge Instructions: Apply moisturizing lotion as directed Topical: Gentamicin 1 x Per Week/30 Days Discharge Instructions: As directed by physician Topical: Mupirocin Ointment 1 x Per Week/30 Days Discharge Instructions: Apply Mupirocin (Bactroban) as instructed Prim Dressing: Maxorb Extra Calcium Alginate 2x2 in 1 x Per Week/30 Days ary Discharge Instructions: Apply calcium alginate to wound bed as instructed Secondary Dressing: ABD Pad, 8x10 1 x Per Week/30 Days Discharge Instructions: Apply over primary dressing as directed. Com pression Wrap: ThreePress (3 layer compression wrap) 1 x Per Week/30 Days Discharge Instructions: Apply three layer compression as directed. WOUND #12: - Lower Leg Wound Laterality: Right, Anterior, Distal Cleanser: Soap and Water 1 x Per Week/30 Days Discharge Instructions: May shower and wash wound with dial antibacterial soap and water prior to dressing change. Cleanser: Wound Cleanser 1 x Per Week/30 Days  Discharge Instructions: Cleanse the wound with wound cleanser prior to applying a clean dressing using gauze sponges, not tissue or cotton balls. Peri-Wound Care: Triamcinolone 15 (g) 1 x Per Week/30 Days Discharge Instructions: Use triamcinolone 15 (g) as directed Peri-Wound Care: Sween Lotion (Moisturizing lotion) 1 x Per Week/30 Days Discharge Instructions: Apply moisturizing lotion as directed Topical: Gentamicin 1 x Per Week/30 Days Discharge Instructions: As directed by physician Topical: Mupirocin Ointment 1 x Per Week/30 Days Discharge Instructions: Apply Mupirocin (Bactroban) as instructed Prim Dressing: Maxorb Extra Calcium Alginate 2x2 in 1 x Per Week/30 Days ary Discharge Instructions: Apply calcium alginate to wound bed as instructed Secondary Dressing: ABD Pad, 8x10 1 x Per Week/30 Days Discharge Instructions: Apply over primary dressing as  directed. Com pression Wrap: ThreePress (3 layer compression wrap) 1 x Per Week/30 Days Discharge Instructions: Apply three layer compression as directed. WOUND #13: - Lower Leg Wound Laterality: Left, Lateral Cleanser: Soap and Water 1 x Per Week/30 Days Discharge Instructions: May shower and wash wound with dial antibacterial soap and water prior to dressing change. Cleanser: Wound Cleanser 1 x Per Week/30 Days Discharge Instructions: Cleanse the wound with wound cleanser prior to applying a clean dressing using gauze sponges, not tissue or cotton balls. Peri-Wound Care: Triamcinolone 15 (g) 1 x Per Week/30 Days Discharge Instructions: Use triamcinolone 15 (g) as directed Peri-Wound Care: Sween Lotion (Moisturizing lotion) 1 x Per Week/30 Days Discharge Instructions: Apply moisturizing lotion as directed Topical: Gentamicin 1 x Per Week/30 Days Discharge Instructions: As directed by physician Topical: Mupirocin Ointment 1 x Per Week/30 Days Discharge Instructions: Apply Mupirocin (Bactroban) as instructed Prim Dressing: Maxorb Extra Calcium Alginate 2x2 in 1 x Per Week/30 Days ary Discharge Instructions: Apply calcium alginate to wound bed as instructed Secondary Dressing: ABD Pad, 8x10 1 x Per Week/30 Days Discharge Instructions: Apply over primary dressing as directed. Com pression Wrap: ThreePress (3 layer compression wrap) 1 x Per Week/30 Days Discharge Instructions: Apply three layer compression as directed. 1. Cephalexin 2. Silver alginate with mupirocin/gentamicin under 3 layer compression 3. Follow-up in 1 week Electronic Signature(s) Signed: 06/01/2022 5:08:18 PM By: Kalman Shan DO Entered By: Kalman Shan on 06/01/2022 15:12:13 -------------------------------------------------------------------------------- HxROS Details Patient Name: Date of Service: [name], [name] 06/01/2022 1:15 PM Medical Record Number: [id number] Patient Account Number: [id number] Date  of Birth/Sex: Treating RN: Apr 02, 1951 (71 y.o. [name] Primary Care Provider: PCP, NO Other Clinician: Referring Provider: Treating Provider/Extender: [name]: 0 Unable to Obtain Patient History due to Altered Mental Status Information Obtained From Patient Eyes Complaints and Symptoms: Positive for: Glasses / Contacts Medical History: Positive for: Cataracts - 2014 Hematologic/Lymphatic Medical History: Positive for: Lymphedema Past Medical History Notes: Hyperbilirubinemia Cardiovascular Medical History: Positive for: Arrhythmia - A-Fib; Peripheral Venous Disease Gastrointestinal Medical History: Past Medical History Notes: Rectal Pain-Constipation Endocrine Medical History: Positive for: Type II Diabetes Time with diabetes: Since 2014 Treated with: Oral agents Blood sugar tested every day: Yes Tested : Genitourinary Medical History: Past Medical History Notes: CKD stage 3-BPH Musculoskeletal Medical History: Positive for: Osteoarthritis Neurologic Medical History: Positive for: Neuropathy Negative for: Quadriplegia; Paraplegia; Seizure Disorder Oncologic Medical History: Negative for: Received Chemotherapy; Received Radiation Past Medical History Notes: Colon Cancer-Colon Re-section Psychiatric Medical History: Negative for: Anorexia/bulimia; Confinement Anxiety HBO Extended History Items Eyes: Cataracts Immunizations Pneumococcal Vaccine: Received Pneumococcal Vaccination: No Implantable Devices None Hospitalization / Surgery History Type of Hospitalization/Surgery colon resection appendectomy multiple tooth extraction Family and  Social History Cancer: Yes - Mother,Maternal Grandparents; Diabetes: No; Heart Disease: Yes - Maternal Grandparents; Hereditary Spherocytosis: No; Hypertension: Yes - Maternal Grandparents; Former smoker - ended on 09/14/1983; Marital Status - Single; Alcohol Use:  Rarely; Drug Use: No History; Caffeine Use: Never; Financial Concerns: No; Food, Clothing or Shelter Needs: No; Support System Lacking: No; Transportation Concerns: No Electronic Signature(s) Signed: 06/01/2022 5:08:18 PM By: Kalman Shan DO Signed: 06/01/2022 6:10:01 PM By: Lorrin Jackson Entered By: Lorrin Jackson on 06/01/2022 13:37:38 -------------------------------------------------------------------------------- [facility] Details Patient Name: Date of Service: [name], [name] 06/01/2022 Medical Record Number: [id number] Patient Account Number: [id number] Date of Birth/Sex: Treating RN: 1951/01/16 (71 y.o. 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 I89.0 Lymphedema, not elsewhere classified E11.622 Type 2 diabetes mellitus with other skin ulcer Facility Procedures CPT4 Code: [id number] Description: 34035 - WOUND CARE VISIT-LEV 5 EST PT Modifier: 25 Quantity: 1 Physician Procedures : CPT4 Code Description Modifier [id number] - WC PHYS LEVEL 4 - EST PT ICD-10 Diagnosis Description L97.812 Non-pressure chronic ulcer of other part of right lower leg with fat layer exposed L97.822 Non-pressure chronic ulcer of other part of left  lower leg with fat layer exposed I87.313 Chronic venous hypertension (idiopathic) with ulcer of bilateral lower extremity I89.0 Lymphedema, not elsewhere classified Quantity: 1 Electronic Signature(s) Signed: 06/01/2022 5:08:18 PM By: Kalman Shan DO Entered By: Kalman Shan on 06/01/2022 15:12:33

## 2022-06-03 ENCOUNTER — Encounter (HOSPITAL_BASED_OUTPATIENT_CLINIC_OR_DEPARTMENT_OTHER): Payer: Self-pay | Admitting: Family

## 2022-06-03 ENCOUNTER — Ambulatory Visit (INDEPENDENT_AMBULATORY_CARE_PROVIDER_SITE_OTHER): Payer: Medicare Other | Admitting: Family

## 2022-06-03 VITALS — BP 130/68 | HR 70 | Ht 75.0 in | Wt 277.4 lb

## 2022-06-03 DIAGNOSIS — D6859 Other primary thrombophilia: Secondary | ICD-10-CM

## 2022-06-03 DIAGNOSIS — I890 Lymphedema, not elsewhere classified: Secondary | ICD-10-CM

## 2022-06-03 DIAGNOSIS — I10 Essential (primary) hypertension: Secondary | ICD-10-CM

## 2022-06-03 DIAGNOSIS — I77810 Thoracic aortic ectasia: Secondary | ICD-10-CM | POA: Diagnosis not present

## 2022-06-03 DIAGNOSIS — I480 Paroxysmal atrial fibrillation: Secondary | ICD-10-CM

## 2022-06-03 DIAGNOSIS — R600 Localized edema: Secondary | ICD-10-CM

## 2022-06-03 DIAGNOSIS — E782 Mixed hyperlipidemia: Secondary | ICD-10-CM

## 2022-06-03 NOTE — Progress Notes (Signed)
Office Visit    Patient Name: [name] Date of Encounter: 06/03/2022  PCP:  Pcp, No - sees PCp at [place]  Cardiologist:  Pixie Casino, MD  Advanced Practice Provider:  No care team member to display Electrophysiologist:  None      Chief Complaint    [name] is a 71 y.o. male with a hx of atrial fibrillation, constipation, DM2, HTN, HLD, macrocytic anemia presents today for follow up of atrial fibrillation.  Past Medical History    Past Medical History:  Diagnosis Date   Atrial fibrillation ([place])    Cataract    Diabetes mellitus without complication ([place])    Hyperlipidemia    Hypertension    Lichen planus    bilateral legs   Past Surgical History:  Procedure Laterality Date   APPENDECTOMY     COLON RESECTION N/A 11/22/2016   Procedure: HAND ASSISTED LAPAROSCOPIC COLON RESECTION;  Surgeon: [name], MD;  Location: [facility];  Service: General;  Laterality: N/A;   COLONOSCOPY N/A 11/17/2016   Procedure: COLONOSCOPY;  Surgeon: [name], MD;  Location: [facility];  Service: Endoscopy;  Laterality: N/A;   FRACTURE SURGERY     MULTIPLE TOOTH EXTRACTIONS     RECTAL EXAM UNDER ANESTHESIA N/A 12/05/2016   Procedure: RECTAL EXAM UNDER ANESTHESIA, DISIMPACTION;  Surgeon: Alphonsa Overall, MD;  Location: [facility];  Service: General;  Laterality: N/A;   Allergies  Allergies  Allergen Reactions   Other     [name] bandage (self-gripping) - erythema, itching    History of Present Illness    [name] is a 71 y.o. male with a hx of atrial fibrillation, BPH, anxiety, constipation, DM2, HTN, HLD, macrocytic anemia, bifasicular block last seen 01/2022.   He established with Dr. Debara Pickett in 2014 due to moving to the area from [place] with known diagnosis of atrial fibrillation. He has been rate controlled. Intermittently on anticoagulation due to financial limitations. He has not followed routinely and has primarily  been seen during hospitalizations.   He was admitted 12/22/21 with abdominal pain, dizziness. Abdominal imaging consistent with cholelithiasis without cholecystitis. He was found to be bradycardia in the 30s with bifascicular block. Digoxin level 1.1 and discontinued. Metoprolol additionally discontinued. Echo with EF 60-65%, no RWMA, RV normal size and function, LA mildly dilated, no significant valvular abnormalities, aortic root 37m.   Last seen 01/2022 doing well form a cardiac perspective wearing Unna boots followed by wound clinic due to severe stasis dermatitis. He was provided a Rx for Lasix PRN.   Endorses feeling overall well since last seen from a cardiac perspective. No chest pain, dyspnea. Has had some weight gain which he attributes to inactivity. Since last seen developed lymphedema. He is following with wound clinic and has both Unna boots with R>L at this time. Has started trying to walk more. He notes primary provider discontinued Lasix for a time but it has been since resumed. Also taking Flomax.   EKGs/Labs/Other Studies Reviewed:   The following studies were reviewed today:  Echo 12/23/21  1. Left ventricular ejection fraction, by estimation, is 60 to 65%. The  left ventricle has normal function. The left ventricle has no regional  wall motion abnormalities. Left ventricular diastolic function could not  be evaluated.   2. Right ventricular systolic function was not well visualized. The right  ventricular size is normal. Tricuspid regurgitation signal is inadequate  for assessing  PA pressure.   3. Left atrial size was mildly dilated.   4. The mitral valve is normal in structure. No evidence of mitral valve  regurgitation. No evidence of mitral stenosis.   5. The aortic valve is normal in structure. Aortic valve regurgitation is  not visualized. No aortic stenosis is present.   6. Aortic dilatation noted. Aneurysm of the aortic root, measuring 47 mm.   7. The inferior vena  cava is dilated in size with <50% respiratory  variability, suggesting right atrial pressure of 15 mmHg.   EKG:  No EKG today.   Recent Labs: 12/23/2021: ALT 13; BUN 17; Creatinine, Ser 1.04; Hemoglobin 9.6; Platelets 156; Potassium 4.1; Sodium 133  Recent Lipid Panel    Component Value Date/Time   CHOL 134 04/26/2014 0830   TRIG 110 04/26/2014 0830   HDL 51 04/26/2014 0830   CHOLHDL 2.6 04/26/2014 0830   VLDL 22 04/26/2014 0830   LDLCALC 61 04/26/2014 0830    Risk Assessment/Calculations:   CHA2DS2-VASc Score = 4  This indicates a 4.8% annual risk of stroke. The patient's score is based upon: CHF History: 0 HTN History: 1 Diabetes History: 1 Stroke History: 0 Vascular Disease History: 1 Age Score: 1 Gender Score: 0   Home Medications   Current Meds  Medication Sig   apixaban (ELIQUIS) 5 MG TABS tablet Take 1 tablet (5 mg total) by mouth 2 (two) times daily.   atorvastatin (LIPITOR) 20 MG tablet Take 20 mg by mouth daily.   carbamide peroxide (DEBROX) 6.5 % OTIC solution Place 5 drops into the right ear 2 (two) times daily.   dextrose (GLUTOSE) 40 % GEL Take 1 Tube by mouth as needed for low blood sugar.   docusate sodium (COLACE) 100 MG capsule Take 1 capsule (100 mg total) by mouth 2 (two) times daily.   folic acid (FOLVITE) 1 MG tablet Take 1 mg by mouth See admin instructions. '1mg'$  everyday EXCEPT none on Fridays.   furosemide (LASIX) 20 MG tablet Take 1 tablet (20 mg total) by mouth as needed for edema or fluid (If taking more than twice per week, please call cardiology office.).   glipiZIDE (GLUCOTROL) 10 MG tablet Take 10 mg by mouth 2 (two) times daily before a meal.   hydrOXYzine (ATARAX/VISTARIL) 25 MG tablet Take 1 tablet (25 mg total) by mouth every 8 (eight) hours as needed for itching. Use caution as this medication may make you drowsy   JANUVIA 100 MG tablet Take 100 mg by mouth daily.   metFORMIN (GLUCOPHAGE) 1000 MG tablet Take 1,000 mg by mouth 2 (two)  times daily with a meal.   methotrexate (RHEUMATREX) 2.5 MG tablet Take 10 mg by mouth once a week. Takes on Fridays.   miconazole (MICOTIN) 2 % powder Apply 1 application topically See admin instructions. 1 application topical twice daily And 1 application daily to groin, stomach, thighs, buttocks area for chafing.   pantoprazole (PROTONIX) 40 MG tablet Take 1 tablet (40 mg total) by mouth daily.   polyethylene glycol (MIRALAX) packet Take 17 g by mouth daily. Hold for diarrhea   PRESCRIPTION MEDICATION Apply 1 application topically daily. Tac 0.1% Cetaphil 1:4 Apply to lower legs   tamsulosin (FLOMAX) 0.4 MG CAPS capsule Take 1 capsule (0.4 mg total) by mouth daily.   triamcinolone (KENALOG) 0.1 % Apply 1 application topically See admin instructions. 1 application topical twice daily to itchy areas And 1 extra application daily as needed for dermatitis  Review of Systems      All other systems reviewed and are otherwise negative except as noted above.  Physical Exam    VS:  BP 130/68   Pulse 70   Ht '6\' 3"'$  (1.905 m)   Wt 277 lb 6.4 oz (125.8 kg)   BMI 34.67 kg/m  , BMI Body mass index is 34.67 kg/m.  Wt Readings from Last 3 Encounters:  06/03/22 277 lb 6.4 oz (125.8 kg)  02/02/22 259 lb (117.5 kg)  12/22/21 254 lb 11.2 oz (115.5 kg)   GEN: Well nourished, well developed, in no acute distress. HEENT: normal. Neck: Supple, no JVD, carotid bruits, or masses. Cardiac:  RRR, no murmurs, rubs, or gallops. No clubbing, cyanosis, edema.  Radials/PT 2+ and equal bilaterally.  Respiratory:  Respirations regular and unlabored, clear to auscultation bilaterally. GI: Soft, nontender, nondistended. MS: No deformity or atrophy. Skin: Warm and dry, no rash. Bilateral LE with Unna boots. Neuro:  Strength and sensation are intact. Psych: Normal affect.  Assessment & Plan    Atrial fibrillation / Bradycardia / Bifascicular block (RBBB/LAFB) - No recurrent bradycardia. Asymptomatic  bifascicular block with no lightheadedness, near syncope, syncope. Continue to monitor with periodic EKG. Digoxin and Metoprolol discontinued during January admission - avoid AV nodal blocking agents. Continue rate control strategy as he is asymptomatic in regards to his atrial fibrillation. Rate well controlled today.   Aortic root dilation - 78m by echo 12/2021. Repeat echo in 1 year for monitoring already scheduled. Continue BP control and Atorvastatin.   Chronic anticoagulation -Denies bleeding complications. CHA2DS2-VASc Score = 4 [CHF History: 0, HTN History: 1, Diabetes History: 1, Stroke History: 0, Vascular Disease History: 1, Age Score: 1, Gender Score: 0].  Therefore, the patient's annual risk of stroke is 4.8 %.    Continue Eliquis '5mg'$  BID - denies bleeding complications. Reports getting labs monthly at SNF.   HTN - BP well controlled. Not requiring antihypertensive agents.   HLD - Continue Atorvatatin. Denies myalgias.   DM2 - 12/22/21 A1c 5.5. Continue to follow with PCP at [facility]. Reports CBGs have been well controlled.  LE edema - Prior diagnosis of lymphedema. Follows with wound clinic. Recent echo normal LVEF. Likely some diastolic dysfunction given atrial fibrillation.Continue Lasix PRN - MD at his ALF has made adjustments to Lasix as well.   Disposition: Follow up in 6 months with KPixie Casino MD or APP.  Signed, CLoel Dubonnet NP 06/03/2022, 11:01 AM CSwisher

## 2022-06-03 NOTE — Patient Instructions (Addendum)
Medication Instructions:  Continue your current medications.   *If you need a refill on your cardiac medications before your next appointment, please call your pharmacy*  Lab Work: None ordered today.   Testing/Procedures:  None ordered today - reminder that you have an echocardiogram for monitoring of your aortic root already scheduled for February 2024.   Follow-Up: At [facility], you and your health needs are our priority.  As part of our continuing mission to provide you with exceptional heart care, we have created designated Provider Care Teams.  These Care Teams include your primary Cardiologist (physician) and Advanced Practice Providers (APPs -  Physician Assistants and Nurse Practitioners) who all work together to provide you with the care you need, when you need it.  We recommend signing up for the patient portal called "MyChart".  Sign up information is provided on this After Visit Summary.  MyChart is used to connect with patients for Virtual Visits (Telemedicine).  Patients are able to view lab/test results, encounter notes, upcoming appointments, etc.  Non-urgent messages can be sent to your provider as well.   To learn more about what you can do with MyChart, go to [email or web address].    Your next appointment:   6 month(s)  The format for your next appointment:   In Person  Provider:   Raliegh Ip Mali Hilty, MD or Loel Dubonnet, NP     Other Instructions  Heart Healthy Diet Recommendations: A low-salt diet is recommended. Meats should be grilled, baked, or boiled. Avoid fried foods. Focus on lean protein sources like fish or chicken with vegetables and fruits. The American Heart Association is a Microbiologist!  American Heart Association Diet and Lifeystyle Recommendations   Exercise recommendations: The American Heart Association recommends 150 minutes of moderate intensity exercise weekly. Try 30 minutes of moderate intensity exercise 4-5 times per week. This  could include walking, jogging, or swimming.  To prevent or reduce lower extremity swelling: Eat a low salt diet. Salt makes the body hold onto extra fluid which causes swelling. Sit with legs elevated. For example, in the recliner or on an [place].   Important Information About Sugar

## 2022-06-10 ENCOUNTER — Encounter (HOSPITAL_BASED_OUTPATIENT_CLINIC_OR_DEPARTMENT_OTHER): Payer: Medicare Other | Admitting: Internal Medicine

## 2022-06-10 DIAGNOSIS — L97822 Non-pressure chronic ulcer of other part of left lower leg with fat layer exposed: Secondary | ICD-10-CM

## 2022-06-10 DIAGNOSIS — L97812 Non-pressure chronic ulcer of other part of right lower leg with fat layer exposed: Secondary | ICD-10-CM | POA: Diagnosis not present

## 2022-06-10 DIAGNOSIS — I87313 Chronic venous hypertension (idiopathic) with ulcer of bilateral lower extremity: Secondary | ICD-10-CM

## 2022-06-10 DIAGNOSIS — I890 Lymphedema, not elsewhere classified: Secondary | ICD-10-CM | POA: Diagnosis not present

## 2022-06-10 NOTE — Progress Notes (Addendum)
MENDY, LAPINSKY ([id number]) Visit Report for 06/10/2022 Chief Complaint Document Details Patient Name: Date of Service: [name] 06/10/2022 12:30 PM Medical Record Number: [id number] Patient Account Number: [id number] Date of Birth/Sex: Treating RN: [birth year] (71 y.o. [name] Primary Care Provider: PCP, NO Other Clinician: Referring Provider: Treating Provider/Extender: [name]: 1 Information Obtained from: Patient Chief Complaint 02/03/2022: The patient is here today for reevaluation of bilateral lower extremity edema and venous stasis dermatitis Electronic Signature(s) Signed: 06/10/2022 1:39:41 PM By: Kalman Shan DO Entered By: Kalman Shan on 06/10/2022 13:29:44 -------------------------------------------------------------------------------- HPI Details Patient Name: Date of Service: [name], [name] 06/10/2022 12:30 PM Medical Record Number: [id number] Patient Account Number: [id number] Date of Birth/Sex: Treating RN: [birth year] (71 y.o. [name] Primary Care Provider: PCP, NO Other Clinician: Referring Provider: Treating Provider/Extender: [name]: 1 History of Present Illness HPI Description: Darey Hershberger is a 71 year old male with a past medical history of type 2 diabetes and essential hypertension who presents to the clinic today for bilateral lower extremity wounds. He was recently seen in the ED on 3/9 for bilateral lower extremity edema. He had a DVT study that was negative for clots. He was given triamcinolone cream and advised to elevate legs and obtain compression stockings. He has been using the triamcinolone cream however he is unable to use compression stockings due to difficulty of putting on. Patient states that for the past 2 years he has had opening and closing of weeping wounds to his legs bilaterally. They spontaneously open and close. They  are often open for several weeks before they heal spontaneously. He denies any purulent drainage, increased warmth or increased erythema to the skin. 4/21; patient presents for 1 week follow-up. The wounds on the right leg have healed however he has now 2 new wounds 1 on the anterior and posterior thigh that occurred from him scratching underneath the wrap. The left leg wound has improved and almost closed 4/28; patient presents for 1 week follow-up. He continues to have a wound on the right anterior leg that is showing signs of healing. The posterior right thigh has closed. He now has a new wound to the left leg that is limited to skin breakdown. 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 The wounds are occurring because he goes underneath the leg wraps and scratches creating new wounds. He has no complaints today. 5/5; patient presents for 1 week follow-up. He no longer has any open wounds to his lower extremities bilaterally. He continues to have scattered excoriation marks from where he scratches. These are scabbed over. He states he is going to see dermatology this month. Overall he is doing well and is happy with his care. Readmission 6/10 Patient was followed for open wounds limited to skin breakdown to his lower extremities bilaterally 2/2 venous insufficiency. Once the wounds healed he was discharged with juxta lite compressions and these were placed in office. He was discharged on 5/5. He states that he has not taken the wraps off since discharge and has had them on for over a month. He did not want to take them off because they were very comfortable. He does not have any difficulty putting these on and off if he wanted to. He states that he does not take a shower and just cleans the other parts of his body  at the sink. He noticed that 2 days ago he was having some drainage at the top of the wrap and called in to be  evaluated. He states that he has not seen the wounds until the wraps were taken off today in office. He denies pain or increased warmth or erythema to the area 6/27; patient presents for 2-week follow-up. He has had increased swelling and drainage to the wounds over the last week. He reports increased erythema to the area. He does not have pain however. He denies purulent drainage or fever/chills. 7/5; patient presents for 1 week follow-up. He was prescribed Keflex at last clinic visit and is currently taking this. He reports significant improvement to his leg swelling, tenderness and erythema. He denies signs of infection. 7/21; patient presents for 2-week follow-up. He reports improvement in healing in his right lower extremity. Unfortunately this morning he noticed skin breakdown to his left lower extremity with increased redness and warmth. 7/28; patient presents for 1 week follow-up. He reports completing his antibiotics. He states that his wounds are healed. He has been using his juxta light compressions. He has no issues or complaints today. Readmission 12/17/2021; This is a 71 year old man who has been seen twice by Dr. Heber [place] for wounds on his bilateral lower extremities secondary to chronic venous insufficiency. Most recently this was from 05/22/2021 through 07/09/2021 with bilateral lower extremity wounds largely secondary to chronic venous insufficiency with stasis dermatitis. He has been discharged with juxta lite stockings. The patient tells me that about a month ago he developed too much edema to get his juxta lites on. He developed increased swelling. He saw his primary physician who gave him Lasix and the swelling seems to have come down. 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 He says this dates back into his adolescence and he could not walk barefoot etc. As a result of this he sometimes leaves his shoes and socks on for weeks  and he is done that recently.[name] He arrives in clinic with not any wounds on his legs however with close inspection he had a fairly deep wound in the left second and third toe webspace His skin on his feet was not in good shape macerated flaking malodorous The patient's past medical history has not changed. I note that he had arterial studies done on 06/03/2021 that showed an ABI on the right of 1.26 with triphasic waveforms TBI of 0.63 on the right on the left ABI of 0.85 TBI of 0.63 again with triphasic waveforms 1/19; since the patient was last here he was seen in the hospital for bradycardia. He had his medications adjusted digoxin metoprolol. He tells me they dressed the wound between the left second and third toe. The patient says that he had open wounds on his dorsal foot when we took off the dressing today. I was not exactly sure what he had on their. He had 2 wounds on the left dorsal foot with some significant surrounding erythema.The wound between the left second and third toe was just about closed however when we opened it to examine it today it it opened and a small area. The skin on his lower legs especially the left looks a lot better using triamcinolone and Cetaphil which she is applying He was on cephalexin prior described I think by primary care before he came here he completed this about 3 or 4 days ago 1/26; the area between the left second and  third toes is healed. He still has 2 small areas on the left dorsal foot. The erythema that I marked around this last week seems better. He is completing his doxycycline. I also gave him triamcinolone and Cetaphil last week for the dry flaking irritated inflamed skin on his dorsal feet and lower legs this all seems better. He has been wearing his juxta lite stockings 2/2; everything is healed here. He is now treating his skin with I think both the triamcinolone and Cetaphil mixture that I prescribed and probably pure triamcinolone that the  dermatologist prescribed. I told him to use my compounded steroid cream as a moisture and anti-itch and inflammation cream when he is finished without to go to a pure moisturizer I emphasized that steroids under compression even if its a juxta lite stocking can get absorbed and secondarily can cause skin atrophy. I asked him not to use both. On an optimistic front he seems a lot more interested in treating his skin and watching for areas on his feet that are breaking down. His skin looks a lot better he has a juxta lite stocking 2/14; patient we discharged 2 weeks ago. He has noted increasing edema in the right greater than left leg erythema and intense pruritus. Arrives in clinic really with no open wound however considerable amount of marks on his upper right leg from scratching. He claims compliance with his juxta lites up with up to a week ago on the right. He has been wearing 1 on the left. He has home health nurse put kerlix on this 02/03/2022: No open wounds. He has been in 3 layer compression, which is slipped down and caused some "mushrooming" affect from just below the tibial tuberosity to the knee. The excoriations on his legs have resolved. He reports being compliant with moisturizing and elevation of his legs. He saw his cardiologist yesterday and was prescribed 20 mg of Lasix to be used on an as-needed basis. Readmission 06/01/2022 Mr. Cosmo Tetreault is a 71 year old male with a past medical history of venous insufficiency, lymphedema, type 2 diabetes that presents to the clinic for a 1 month history of worsening leg wounds. He states he has not been wearing his compression wraps for the past 2 months. He states his Lasix was stopped by his primary doctor and then his legs became more swollen and he could not wear his compression stockings. He states eventually he was put back on Lasix. He still has been unable to wear his compression stockings and although his swelling has improved slightly  his wounds have not improved. He has increased warmth and erythema to the right lower extremity. 6/29; patient presents for follow-up. He completed his course of Keflex without issues. We have been using silver alginate with antibiotic ointment under compression therapy. He has no issues or complaints today. He has his juxta lite compressions with him today. Electronic Signature(s) Signed: 06/10/2022 1:39:41 PM By: Kalman Shan DO Entered By: Kalman Shan on 06/10/2022 13:31:13 -------------------------------------------------------------------------------- Physical Exam Details Patient Name: Date of Service: [name], [name] 06/10/2022 12:30 PM Medical Record Number: [id number] Patient Account Number: [id number] Date of Birth/Sex: Treating RN: [birth year] (71 y.o. [name] Primary Care Provider: PCP, NO Other Clinician: Referring Provider: Treating Provider/Extender: [name]: 1 Constitutional respirations regular, non-labored and within target range for patient.. Cardiovascular 2+ dorsalis pedis/posterior tibialis pulses. Psychiatric pleasant and cooperative. Notes Right lower extremity: Scattered open wounds with granulation tissue present. Left lower  extremity: Epithelization to the previous wound sites. Good edema control No signs of surrounding soft tissue infection Electronic Signature(s) Signed: 06/10/2022 1:39:41 PM By: Kalman Shan DO Entered By: Kalman Shan on 06/10/2022 13:31:52 -------------------------------------------------------------------------------- Physician Orders Details Patient Name: Date of Service: [name], [name] 06/10/2022 12:30 PM Medical Record Number: [id number] Patient Account Number: [id number] Date of Birth/Sex: Treating RN: [birth year] (71 y.o. [name] Primary Care Provider: PCP, NO Other Clinician: Referring Provider: Treating Provider/Extender: [name]: 1 Verbal / Phone Orders: No Diagnosis Coding ICD-10 Coding Code Description [phone] Non-pressure chronic ulcer of other part of right lower leg with fat layer exposed L97.822 Non-pressure chronic ulcer of other part of left lower leg with fat layer exposed I87.313 Chronic venous hypertension (idiopathic) with ulcer of bilateral lower extremity I89.0 Lymphedema, not elsewhere classified E11.622 Type 2 diabetes mellitus with other skin ulcer Follow-up Appointments ppointment in 1 week. - 06/18/2022/23 @ 230pm with Dr. Heber [place] and Leveda Anna, RN (Room 7) Return A Other: - ****Patient to go [facility] and make an appointment to be seen for custom fitting compression garments.**** Bathing/ Shower/ Hygiene May shower with protection but do not get wound dressing(s) wet. - may use cast bag Edema Control - Lymphedema / SCD / Other Elevate legs to the level of the heart or above for 30 minutes daily and/or when sitting, a frequency of: - 3-4 times a day throughout the day. Avoid standing for long periods of time. Exercise regularly Moisturize legs daily. - lotion left leg every night before bed. Compression stocking or Garment 30-40 mm/Hg pressure to: - Juxtalite HD to left leg apply in the am and remove at night. Wound Treatment Wound #10 - Lower Leg Wound Laterality: Right, Lateral Cleanser: Soap and Water 1 x Per Week/30 Days Discharge Instructions: May shower and wash wound with dial antibacterial soap and water prior to dressing change. Cleanser: Wound Cleanser 1 x Per Week/30 Days Discharge Instructions: Cleanse the wound with wound cleanser prior to applying a clean dressing using gauze sponges, not tissue or cotton balls. Peri-Wound Care: Ketoconazole Cream 2% 1 x Per Week/30 Days Discharge Instructions: Apply Ketoconazole mixed with TCA and zinc to upper portion of lower leg. Peri-Wound Care: Triamcinolone 15 (g) 1 x Per Week/30 Days Discharge Instructions: Use  triamcinolone 15 (g) as directed Peri-Wound Care: Sween Lotion (Moisturizing lotion) 1 x Per Week/30 Days Discharge Instructions: Apply moisturizing lotion as directed Prim Dressing: KerraCel Ag Gelling Fiber Dressing, 2x2 in (silver alginate) 1 x Per Week/30 Days ary Discharge Instructions: Apply silver alginate to wound bed as instructed Secondary Dressing: ABD Pad, 8x10 1 x Per Week/30 Days Discharge Instructions: Apply over primary dressing as directed. Compression Wrap: ThreePress (3 layer compression wrap) 1 x Per Week/30 Days Discharge Instructions: Apply three layer compression as directed. Electronic Signature(s) Signed: 06/10/2022 1:39:41 PM By: Kalman Shan DO Entered By: Kalman Shan on 06/10/2022 13:32:00 -------------------------------------------------------------------------------- Problem List Details Patient Name: Date of Service: [name], [name] 06/10/2022 12:30 PM Medical Record Number: [id number] Patient Account Number: [id number] Date of Birth/Sex: Treating RN: [birth year] (71 y.o. [name] Primary Care Provider: PCP, NO Other Clinician: Referring Provider: Treating Provider/Extender: [name]: 1 Active Problems ICD-10 Encounter Code Description Active Date MDM Diagnosis [phone] Non-pressure chronic ulcer of other part of right lower leg with fat layer 06/01/2022 No Yes exposed L97.822 Non-pressure chronic ulcer of other part of left lower leg with  fat layer exposed6/20/2023 No Yes I87.313 Chronic venous hypertension (idiopathic) with ulcer of bilateral lower extremity 06/01/2022 No Yes I89.0 Lymphedema, not elsewhere classified 06/01/2022 No Yes E11.622 Type 2 diabetes mellitus with other skin ulcer 06/01/2022 No Yes Inactive Problems Resolved Problems Electronic Signature(s) Signed: 06/10/2022 1:39:41 PM By: Kalman Shan DO Entered By: Kalman Shan on 06/10/2022  13:27:35 -------------------------------------------------------------------------------- Progress Note Details Patient Name: Date of Service: [name], [name] 06/10/2022 12:30 PM Medical Record Number: [id number] Patient Account Number: [id number] Date of Birth/Sex: Treating RN: Jun 16, 1951 (71 y.o. [name] Primary Care Provider: PCP, NO Other Clinician: Referring Provider: Treating Provider/Extender: [name]: 1 Subjective Chief Complaint Information obtained from Patient 02/03/2022: The patient is here today for reevaluation of bilateral lower extremity edema and venous stasis dermatitis History of Present Illness (HPI) Jessiah Steinhart is a 71 year old male with a past medical history of type 2 diabetes and essential hypertension who presents to the clinic today for bilateral lower extremity wounds. He was recently seen in the ED on 3/9 for bilateral lower extremity edema. He had a DVT study that was negative for clots. He was given triamcinolone cream and advised to elevate legs and obtain compression stockings. He has been using the triamcinolone cream however he is unable to use compression stockings due to difficulty of putting on. Patient states that for the past 2 years he has had opening and closing of weeping wounds to his legs bilaterally. They spontaneously open and close. They are often open for several weeks before they heal spontaneously. He denies any purulent drainage, increased warmth or increased erythema to the skin. 4/21; patient presents for 1 week follow-up. The wounds on the right leg have healed however he has now 2 new wounds 1 on the anterior and posterior thigh that occurred from him scratching underneath the wrap. The left leg wound has improved and almost closed 4/28; patient presents for 1 week follow-up. He continues to have a wound on the right anterior leg that is showing signs of healing. The posterior right  thigh has closed. He now has a new wound to the left leg that is limited to skin breakdown. 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 The wounds are occurring because he goes underneath the leg wraps and scratches creating new wounds. He has no complaints today. 5/5; patient presents for 1 week follow-up. He no longer has any open wounds to his lower extremities bilaterally. He continues to have scattered excoriation marks from where he scratches. These are scabbed over. He states he is going to see dermatology this month. Overall he is doing well and is happy with his care. Readmission 6/10 Patient was followed for open wounds limited to skin breakdown to his lower extremities bilaterally 2/2 venous insufficiency. Once the wounds healed he was discharged with juxta lite compressions and these were placed in office. He was discharged on 5/5. He states that he has not taken the wraps off since discharge and has had them on for over a month. He did not want to take them off because they were very comfortable. He does not have any difficulty putting these on and off if he wanted to. He states that he does not take a shower and just cleans the other parts of his body at the sink. He noticed that 2 days ago he was having some drainage at the top of the  wrap and called in to be evaluated. He states that he has not seen the wounds until the wraps were taken off today in office. He denies pain or increased warmth or erythema to the area 6/27; patient presents for 2-week follow-up. He has had increased swelling and drainage to the wounds over the last week. He reports increased erythema to the area. He does not have pain however. He denies purulent drainage or fever/chills. 7/5; patient presents for 1 week follow-up. He was prescribed Keflex at last clinic visit and is currently taking this. He reports significant improvement to his leg  swelling, tenderness and erythema. He denies signs of infection. 7/21; patient presents for 2-week follow-up. He reports improvement in healing in his right lower extremity. Unfortunately this morning he noticed skin breakdown to his left lower extremity with increased redness and warmth. 7/28; patient presents for 1 week follow-up. He reports completing his antibiotics. He states that his wounds are healed. He has been using his juxta light compressions. He has no issues or complaints today. Readmission 12/17/2021; This is a 71 year old man who has been seen twice by Dr. Heber [place] for wounds on his bilateral lower extremities secondary to chronic venous insufficiency. Most recently this was from 05/22/2021 through 07/09/2021 with bilateral lower extremity wounds largely secondary to chronic venous insufficiency with stasis dermatitis. He has been discharged with juxta lite stockings. The patient tells me that about a month ago he developed too much edema to get his juxta lites on. He developed increased swelling. He saw his primary physician who gave him Lasix and the swelling seems to have come down. 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 He says this dates back into his adolescence and he could not walk barefoot etc. As a result of this he sometimes leaves his shoes and socks on for weeks and he is done that recently.[name] He arrives in clinic with not any wounds on his legs however with close inspection he had a fairly deep wound in the left second and third toe webspace His skin on his feet was not in good shape macerated flaking malodorous The patient's past medical history has not changed. I note that he had arterial studies done on 06/03/2021 that showed an ABI on the right of 1.26 with triphasic waveforms TBI of 0.63 on the right on the left ABI of 0.85 TBI of 0.63 again with triphasic waveforms 1/19; since the patient was last here he was seen  in the hospital for bradycardia. He had his medications adjusted digoxin metoprolol. He tells me they dressed the wound between the left second and third toe. The patient says that he had open wounds on his dorsal foot when we took off the dressing today. I was not exactly sure what he had on their. He had 2 wounds on the left dorsal foot with some significant surrounding erythema.The wound between the left second and third toe was just about closed however when we opened it to examine it today it it opened and a small area. The skin on his lower legs especially the left looks a lot better using triamcinolone and Cetaphil which she is applying He was on cephalexin prior described I think by primary care before he came here he completed this about 3 or 4 days ago 1/26; the area between the left second and third toes is healed. He still has 2 small areas on the left dorsal foot. The erythema that I  marked around this last week seems better. He is completing his doxycycline. I also gave him triamcinolone and Cetaphil last week for the dry flaking irritated inflamed skin on his dorsal feet and lower legs this all seems better. He has been wearing his juxta lite stockings 2/2; everything is healed here. He is now treating his skin with I think both the triamcinolone and Cetaphil mixture that I prescribed and probably pure triamcinolone that the dermatologist prescribed. I told him to use my compounded steroid cream as a moisture and anti-itch and inflammation cream when he is finished without to go to a pure moisturizer I emphasized that steroids under compression even if its a juxta lite stocking can get absorbed and secondarily can cause skin atrophy. I asked him not to use both. On an optimistic front he seems a lot more interested in treating his skin and watching for areas on his feet that are breaking down. His skin looks a lot better he has a juxta lite stocking 2/14; patient we discharged 2 weeks  ago. He has noted increasing edema in the right greater than left leg erythema and intense pruritus. Arrives in clinic really with no open wound however considerable amount of marks on his upper right leg from scratching. He claims compliance with his juxta lites up with up to a week ago on the right. He has been wearing 1 on the left. He has home health nurse put kerlix on this 02/03/2022: No open wounds. He has been in 3 layer compression, which is slipped down and caused some "mushrooming" affect from just below the tibial tuberosity to the knee. The excoriations on his legs have resolved. He reports being compliant with moisturizing and elevation of his legs. He saw his cardiologist yesterday and was prescribed 20 mg of Lasix to be used on an as-needed basis. Readmission 06/01/2022 Mr. Matheu Ploeger is a 71 year old male with a past medical history of venous insufficiency, lymphedema, type 2 diabetes that presents to the clinic for a 1 month history of worsening leg wounds. He states he has not been wearing his compression wraps for the past 2 months. He states his Lasix was stopped by his primary doctor and then his legs became more swollen and he could not wear his compression stockings. He states eventually he was put back on Lasix. He still has been unable to wear his compression stockings and although his swelling has improved slightly his wounds have not improved. He has increased warmth and erythema to the right lower extremity. 6/29; patient presents for follow-up. He completed his course of Keflex without issues. We have been using silver alginate with antibiotic ointment under compression therapy. He has no issues or complaints today. He has his juxta lite compressions with him today. Patient History Unable to Obtain Patient History due to Altered Mental Status. Information obtained from Patient. Family History Cancer - Mother,Maternal Grandparents, Heart Disease - Maternal  Grandparents, Hypertension - Maternal Grandparents, No family history of Diabetes, Hereditary Spherocytosis. Social History Former smoker - ended on 09/14/1983, Marital Status - Single, Alcohol Use - Rarely, Drug Use - No History, Caffeine Use - Never. Medical History Eyes Patient has history of Cataracts - 2014 Hematologic/Lymphatic Patient has history of Lymphedema Cardiovascular Patient has history of Arrhythmia - A-Fib, Peripheral Venous Disease Endocrine Patient has history of Type II Diabetes Musculoskeletal Patient has history of Osteoarthritis Neurologic Patient has history of Neuropathy Denies history of Quadriplegia, Paraplegia, Seizure Disorder Oncologic Denies history of Received Chemotherapy, Received Radiation  Psychiatric Denies history of Anorexia/bulimia, Confinement Anxiety Hospitalization/Surgery History - colon resection. - appendectomy. - multiple tooth extraction. Medical A Surgical History Notes nd Hematologic/Lymphatic Hyperbilirubinemia Gastrointestinal Rectal Pain-Constipation Genitourinary CKD stage 3-BPH Oncologic Colon Cancer-Colon Re-section Objective Constitutional respirations regular, non-labored and within target range for patient.. Vitals Time Taken: 1:06 PM, Temperature: 98 F, Pulse: 81 bpm, Respiratory Rate: 18 breaths/min, Blood Pressure: 120/71 mmHg. Cardiovascular 2+ dorsalis pedis/posterior tibialis pulses. Psychiatric pleasant and cooperative. General Notes: Right lower extremity: Scattered open wounds with granulation tissue present. Left lower extremity: Epithelization to the previous wound sites. Good edema control No signs of surrounding soft tissue infection Integumentary (Hair, Skin) Wound #10 status is Open. Original cause of wound was Blister. The date acquired was: 04/26/2022. The wound has been in treatment 1 weeks. The wound is located on the Right,Lateral Lower Leg. The wound measures 0.4cm length x 0.4cm width x 0.1cm  depth; 0.126cm^2 area and 0.013cm^3 volume. There is Fat Layer (Subcutaneous Tissue) exposed. There is no tunneling or undermining noted. There is a none present amount of drainage noted. The wound margin is distinct with the outline attached to the wound base. There is large (67-100%) pink granulation within the wound bed. There is no necrotic tissue within the wound bed. Wound #11 status is Healed - Epithelialized. Original cause of wound was Blister. The date acquired was: 04/26/2022. The wound has been in treatment 1 weeks. The wound is located on the Right,Anterior Lower Leg. The wound measures 0cm length x 0cm width x 0cm depth; 0cm^2 area and 0cm^3 volume. There is no tunneling or undermining noted. There is a none present amount of drainage noted. The wound margin is distinct with the outline attached to the wound base. There is no granulation within the wound bed. There is no necrotic tissue within the wound bed. Wound #12 status is Healed - Epithelialized. Original cause of wound was Gradually Appeared. The date acquired was: 04/26/2022. The wound has been in treatment 1 weeks. The wound is located on the Right,Distal,Anterior Lower Leg. The wound measures 0cm length x 0cm width x 0cm depth; 0cm^2 area and 0cm^3 volume. There is no tunneling or undermining noted. There is a none present amount of drainage noted. The wound margin is well defined and not attached to the wound base. There is no granulation within the wound bed. There is no necrotic tissue within the wound bed. Wound #13 status is Healed - Epithelialized. Original cause of wound was Blister. The date acquired was: 04/26/2022. The wound has been in treatment 1 weeks. The wound is located on the Left,Lateral Lower Leg. The wound measures 0cm length x 0cm width x 0cm depth; 0cm^2 area and 0cm^3 volume. There is Fat Layer (Subcutaneous Tissue) exposed. There is no tunneling or undermining noted. There is a none present amount of  drainage noted. The wound margin is distinct with the outline attached to the wound base. There is no granulation within the wound bed. There is no necrotic tissue within the wound bed. 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 He has no signs of surrounding soft tissue infection. The left lower extremity wounds have healed. He  may benefit from custom compression garments as he has very long legs. I recommended for now using the juxta lite to the left lower extremity. We will continue silver alginate under 3 layer compression to the right leg. I gave him a prescription for custom garment. We gave him the information to get these Measured and fitted. Procedures Wound #10 Pre-procedure diagnosis of Wound #10 is a Venous Leg Ulcer located on the Right,Lateral Lower Leg . There was a Three Layer Compression Therapy Procedure by Lorrin Jackson, RN. Post procedure Diagnosis Wound #10: Same as Pre-Procedure Plan Follow-up Appointments: Return Appointment in 1 week. - 06/18/2022/23 @ 230pm with Dr. Heber Bracken and Leveda Anna, RN (Room 7) Other: - ****Patient to go [facility] and make an appointment to be seen for custom fitting compression garments.**** Bathing/ Shower/ Hygiene: May shower with protection but do not get wound dressing(s) wet. - may use cast bag Edema Control - Lymphedema / SCD / Other: Elevate legs to the level of the heart or above for 30 minutes daily and/or when sitting, a frequency of: - 3-4 times a day throughout the day. Avoid standing for long periods of time. Exercise  regularly Moisturize legs daily. - lotion left leg every night before bed. Compression stocking or Garment 30-40 mm/Hg pressure to: - Juxtalite HD to left leg apply in the am and remove at night. WOUND #10: - Lower Leg Wound Laterality: Right, Lateral Cleanser: Soap and Water 1 x Per Week/30 Days Discharge Instructions: May shower and wash wound with dial antibacterial soap and water prior to dressing change. Cleanser: Wound Cleanser 1 x Per Week/30 Days Discharge Instructions: Cleanse the wound with wound cleanser prior to applying a clean dressing using gauze sponges, not tissue or cotton balls. Peri-Wound Care: Ketoconazole Cream 2% 1 x Per Week/30 Days Discharge Instructions: Apply Ketoconazole mixed with TCA and zinc to upper portion of lower leg. Peri-Wound Care: Triamcinolone 15 (g) 1 x Per Week/30 Days Discharge Instructions: Use triamcinolone 15 (g) as directed Peri-Wound Care: Sween Lotion (Moisturizing lotion) 1 x Per Week/30 Days Discharge Instructions: Apply moisturizing lotion as directed Prim Dressing: KerraCel Ag Gelling Fiber Dressing, 2x2 in (silver alginate) 1 x Per Week/30 Days ary Discharge Instructions: Apply silver alginate to wound bed as instructed Secondary Dressing: ABD Pad, 8x10 1 x Per Week/30 Days Discharge Instructions: Apply over primary dressing as directed. Com pression Wrap: ThreePress (3 layer compression wrap) 1 x Per Week/30 Days Discharge Instructions: Apply three layer compression as directed. 1. Silver alginate under 3 layer compression to the right lower extremity 2. Juxta light compression daily to the left lower extremity 3. Follow-up in 1 week 4. Prescription for custom compression garment Electronic Signature(s) Signed: 06/10/2022 1:39:41 PM By: Kalman Shan DO Entered By: Kalman Shan on 06/10/2022 13:34:21 -------------------------------------------------------------------------------- HxROS Details Patient Name: Date of  Service: [name], [name] 06/10/2022 12:30 PM Medical Record Number: [id number] Patient Account Number: [id number] Date of Birth/Sex: Treating RN: [birth year] (71 y.o. [name] Primary Care Provider: PCP, NO Other Clinician: Referring Provider: Treating Provider/Extender: [name]: 1 Unable to Obtain Patient History due to Altered Mental Status Information Obtained From Patient Eyes Medical History: Positive for: Cataracts - 2014 Hematologic/Lymphatic Medical History: Positive for: Lymphedema Past Medical History Notes: Hyperbilirubinemia Cardiovascular Medical History: Positive for: Arrhythmia - A-Fib; Peripheral Venous Disease Gastrointestinal Medical History: Past Medical History Notes: Rectal Pain-Constipation Endocrine Medical History: Positive for: Type II Diabetes Time with diabetes: Since 2014 Treated with:  Oral agents Blood sugar tested every day: Yes Tested : Genitourinary Medical History: Past Medical History Notes: CKD stage 3-BPH Musculoskeletal Medical History: Positive for: Osteoarthritis Neurologic Medical History: Positive for: Neuropathy Negative for: Quadriplegia; Paraplegia; Seizure Disorder Oncologic Medical History: Negative for: Received Chemotherapy; Received Radiation Past Medical History Notes: Colon Cancer-Colon Re-section Psychiatric Medical History: Negative for: Anorexia/bulimia; Confinement Anxiety HBO Extended History Items Eyes: Cataracts Immunizations Pneumococcal Vaccine: Received Pneumococcal Vaccination: No Implantable Devices None Hospitalization / Surgery History Type of Hospitalization/Surgery colon resection appendectomy multiple tooth extraction Family and Social History Cancer: Yes - Mother,Maternal Grandparents; Diabetes: No; Heart Disease: Yes - Maternal Grandparents; Hereditary Spherocytosis: No; Hypertension: Yes - Maternal Grandparents; Former smoker -  ended on 09/14/1983; Marital Status - Single; Alcohol Use: Rarely; Drug Use: No History; Caffeine Use: Never; Financial Concerns: No; Food, Clothing or Shelter Needs: No; Support System Lacking: No; Transportation Concerns: No Electronic Signature(s) Signed: 06/10/2022 1:39:41 PM By: Kalman Shan DO Signed: 06/10/2022 4:43:44 PM By: Lorrin Jackson Entered By: Kalman Shan on 06/10/2022 13:31:19 -------------------------------------------------------------------------------- SuperBill Details Patient Name: Date of Service: [name], [name] 06/10/2022 Medical Record Number: [id number] Patient Account Number: [id number] Date of Birth/Sex: Treating RN: October 08, 1951 (71 y.o. [name] Primary Care Provider: PCP, NO Other Clinician: Referring Provider: Treating Provider/Extender: [name]: 1 Diagnosis Coding ICD-10 Codes Code Description [phone] Non-pressure chronic ulcer of other part of right lower leg with fat layer exposed L97.822 Non-pressure chronic ulcer of other part of left lower leg with fat layer exposed I87.313 Chronic venous hypertension (idiopathic) with ulcer of bilateral lower extremity I89.0 Lymphedema, not elsewhere classified E11.622 Type 2 diabetes mellitus with other skin ulcer Facility Procedures CPT4 Code: [id number] Description: (Facility Use Only) [phone] - [place] [facility] RT LEG ICD-10 Diagnosis Description L97.812 Non-pressure chronic ulcer of other part of right lower leg with fat layer exposed Modifier: Quantity: 1 Physician Procedures : CPT4 Code Description Modifier [id number] - WC PHYS LEVEL 3 - EST PT ICD-10 Diagnosis Description L97.812 Non-pressure chronic ulcer of other part of right lower leg with fat layer exposed L97.822 Non-pressure chronic ulcer of other part of left  lower leg with fat layer exposed I87.313 Chronic venous hypertension (idiopathic) with ulcer of bilateral lower extremity  I89.0 Lymphedema, not elsewhere classified Quantity: 1 Electronic Signature(s) Signed: 06/11/2022 9:28:15 AM By: Lorrin Jackson Signed: 06/11/2022 9:28:39 AM By: Kalman Shan DO Previous Signature: 06/10/2022 1:39:41 PM Version By: Kalman Shan DO Entered By: Lorrin Jackson on 06/11/2022 09:28:15

## 2022-06-10 NOTE — Progress Notes (Signed)
[name], [name] ([id number]) Visit Report for 06/10/2022 Arrival Information Details Patient Name: Date of Service: [name] 06/10/2022 12:30 PM Medical Record Number: [id number] Patient Account Number: [id number] Date of Birth/Sex: Treating RN: [birth year] (71 y.o. [name] Primary Care Madonna Flegal: PCP, NO Other Clinician: Referring Nayely Dingus: Treating Hala Narula/Extender: [name]: 1 Visit Information History Since Last Visit Added or deleted any medications: No Patient Arrived: Walker Any new allergies or adverse reactions: No Arrival Time: 13:06 Had a fall or experienced change in No Transfer Assistance: None activities of daily living that may affect Patient Identification Verified: Yes risk of falls: Secondary Verification Process Completed: Yes Signs or symptoms of abuse/neglect since last visito No Patient Requires Transmission-Based Precautions: No Hospitalized since last visit: No Patient Has Alerts: Yes Implantable device outside of the clinic excluding No Patient Alerts: Patient on Blood Thinner cellular tissue based products placed in the center since last visit: Has Dressing in Place as Prescribed: Yes Has Compression in Place as Prescribed: Yes Pain Present Now: No Electronic Signature(s) Signed: 06/10/2022 4:43:44 PM By: Lorrin Jackson Entered By: Lorrin Jackson on 06/10/2022 13:07:19 -------------------------------------------------------------------------------- Compression Therapy Details Patient Name: Date of Service: [name], Allen [name] 06/10/2022 12:30 PM Medical Record Number: [id number] Patient Account Number: [id number] Date of Birth/Sex: Treating RN: Nov 03, 1951 (71 y.o. [name] Primary Care Manali Mcelmurry: PCP, NO Other Clinician: Referring Mariadelosang Wynns: Treating Leeta Grimme/Extender: [name]: 1 Compression Therapy Performed for Wound Assessment: Wound #10  Right,Lateral Lower Leg Performed By: Clinician Lorrin Jackson, RN Compression Type: Three Layer Post Procedure Diagnosis Same as Pre-procedure Electronic Signature(s) Signed: 06/10/2022 4:43:44 PM By: Lorrin Jackson Entered By: Lorrin Jackson on 06/10/2022 13:24:05 -------------------------------------------------------------------------------- Lower Extremity Assessment Details Patient Name: Date of Service: [name], Allen [name] 06/10/2022 12:30 PM Medical Record Number: [id number] Patient Account Number: [id number] Date of Birth/Sex: Treating RN: July 31, 1951 (71 y.o. [name] Primary Care Coryn Mosso: PCP, NO Other Clinician: Referring Garrell Flagg: Treating Tyjon Bowen/Extender: [name]: 1 Edema Assessment Assessed: [Left: Yes] [Right: Yes] Edema: [Left: Yes] [Right: Yes] Calf Left: Right: Point of Measurement: 37 cm From Medial Instep 39 cm 35 cm Ankle Left: Right: Point of Measurement: 11 cm From Medial Instep 25 cm 24 cm Vascular Assessment Pulses: Dorsalis Pedis Palpable: [Left:Yes] [Right:Yes] Electronic Signature(s) Signed: 06/10/2022 5:00:34 PM By: Deon Pilling RN, BSN Entered By: Deon Pilling on 06/10/2022 13:15:46 -------------------------------------------------------------------------------- Multi Wound Chart Details Patient Name: Date of Service: [name], Allen [name] 06/10/2022 12:30 PM Medical Record Number: [id number] Patient Account Number: [id number] Date of Birth/Sex: Treating RN: [birth year] (70 y.o. [name] Primary Care Imo Cumbie: PCP, NO Other Clinician: Referring Jatavis Malek: Treating Xiara Knisley/Extender: [name]: 1 Vital Signs Height(in): Pulse(bpm): 34 Weight(lbs): Blood Pressure(mmHg): 120/71 Body Mass Index(BMI): Temperature(F): 98 Respiratory Rate(breaths/min): 18 Photos: [10:Right, Lateral Lower Leg] [11:Right, Anterior Lower Leg] [12:Right, Distal,  Anterior Lower Leg] Wound Location: [10:Blister] [11:Blister] [12:Gradually Appeared] Wounding Event: [10:Venous Leg Ulcer] [11:Venous Leg Ulcer] [12:Venous Leg Ulcer] Primary Etiology: [10:Cataracts, Lymphedema, Arrhythmia, Cataracts, Lymphedema, Arrhythmia,] [12:Cataracts, Lymphedema, Arrhythmia,] Comorbid History: [10:Peripheral Venous Disease, Type II Peripheral Venous Disease, Type II Diabetes, Osteoarthritis, Neuropathy Diabetes, Osteoarthritis, Neuropathy 04/26/2022] [11:04/26/2022] [12:Peripheral Venous Disease, Type II Diabetes,  Osteoarthritis, Neuropathy 04/26/2022] Date Acquired: [10:1] [11:1] [12:1] Weeks of Treatment: [10:Open] [11:Healed - Epithelialized] [12:Healed - Epithelialized] Wound Status: [10:No] [11:No] [12:No] Wound Recurrence: [10:0.4x0.4x0.1] [11:0x0x0] [12:0x0x0] Measurements L x  W x D (cm) [10:0.126] [11:0] [12:0] A (cm) : rea [10:0.013] [11:0] [12:0] Volume (cm) : [10:98.70%] [11:100.00%] [12:100.00%] % Reduction in Area: [10:98.70%] [11:100.00%] [12:100.00%] % Reduction in Volume: [10:Full Thickness Without Exposed] [11:Full Thickness Without Exposed] [12:Full Thickness Without Exposed] Classification: [10:Support Structures None Present] [11:Support Structures None Present] [12:Support Structures None Present] Exudate Amount: [10:Distinct, outline attached] [11:Distinct, outline attached] [12:Well defined, not attached] Wound Margin: [10:Large (67-100%)] [11:None Present (0%)] [12:None Present (0%)] Granulation Amount: [10:Pink] [11:N/A] [12:N/A] Granulation Quality: [10:None Present (0%)] [11:None Present (0%)] [12:None Present (0%)] Necrotic Amount: [10:Fat Layer (Subcutaneous Tissue): Yes Fascia: No] [12:Fascia: No] Exposed Structures: [10:Fascia: No Tendon: No Muscle: No Joint: No Bone: No Large (67-100%)] [11:Fat Layer (Subcutaneous Tissue): No Tendon: No Muscle: No Joint: No Bone: No Large (67-100%)] [12:Fat Layer (Subcutaneous Tissue): No Tendon: No  Muscle: No Joint: No Bone: No  Large (67-100%)] Epithelialization: [10:Compression Therapy] [11:N/A] [12:N/A] Wound Number: 13 N/A N/A Photos: N/A N/A Left, Lateral Lower Leg N/A N/A Wound Location: Blister N/A N/A Wounding Event: Venous Leg Ulcer N/A N/A Primary Etiology: Cataracts, Lymphedema, Arrhythmia, N/A N/A Comorbid History: Peripheral Venous Disease, Type II Diabetes, Osteoarthritis, Neuropathy 04/26/2022 N/A N/A Date Acquired: 1 N/A N/A Weeks of Treatment: Healed - Epithelialized N/A N/A Wound Status: No N/A N/A Wound Recurrence: 0x0x0 N/A N/A Measurements L x W x D (cm) 0 N/A N/A A (cm) : rea 0 N/A N/A Volume (cm) : 100.00% N/A N/A % Reduction in Area: 100.00% N/A N/A % Reduction in Volume: Full Thickness Without Exposed N/A N/A Classification: Support Structures None Present N/A N/A Exudate Amount: Distinct, outline attached N/A N/A Wound Margin: None Present (0%) N/A N/A Granulation Amount: N/A N/A N/A Granulation Quality: None Present (0%) N/A N/A Necrotic Amount: Fat Layer (Subcutaneous Tissue): Yes N/A N/A Exposed Structures: Fascia: No Tendon: No Muscle: No Joint: No Bone: No Large (67-100%) N/A N/A Epithelialization: N/A N/A N/A Procedures Performed: Treatment Notes Electronic Signature(s) Signed: 06/10/2022 1:39:41 PM By: Kalman Shan DO Signed: 06/10/2022 4:43:44 PM By: Lorrin Jackson Entered By: Kalman Shan on 06/10/2022 13:29:21 -------------------------------------------------------------------------------- Multi-Disciplinary Care Plan Details Patient Name: Date of Service: [name], Allen [name] 06/10/2022 12:30 PM Medical Record Number: [id number] Patient Account Number: [id number] Date of Birth/Sex: Treating RN: [birth year] (71 y.o. [name] Primary Care Ahsan Esterline: PCP, NO Other Clinician: Referring Rande Dario: Treating Davide Risdon/Extender: [name]: 1 Active  Inactive Venous Leg Ulcer Nursing Diagnoses: Actual venous Insuffiency (use after diagnosis is confirmed) Goals: Patient will maintain optimal edema control Date Initiated: 06/01/2022 Target Resolution Date: 08/13/2022 Goal Status: Active Interventions: Assess peripheral edema status every visit. Compression as ordered Treatment Activities: Therapeutic compression applied : 06/01/2022 Notes: Wound/Skin Impairment Nursing Diagnoses: Impaired tissue integrity Goals: Patient/caregiver will verbalize understanding of skin care regimen Date Initiated: 06/01/2022 Target Resolution Date: 08/13/2022 Goal Status: Active Ulcer/skin breakdown will have a volume reduction of 30% by week 4 Date Initiated: 06/01/2022 Target Resolution Date: 06/28/2022 Goal Status: Active Interventions: Assess patient/caregiver ability to obtain necessary supplies Assess patient/caregiver ability to perform ulcer/skin care regimen upon admission and as needed Assess ulceration(s) every visit Provide education on ulcer and skin care Treatment Activities: Topical wound management initiated : 06/01/2022 Notes: Electronic Signature(s) Signed: 06/10/2022 4:43:44 PM By: Lorrin Jackson Entered By: Lorrin Jackson on 06/10/2022 13:23:41 -------------------------------------------------------------------------------- Pain Assessment Details Patient Name: Date of Service: [name], Allen [name] 06/10/2022 12:30 PM Medical Record Number: [id number] Patient Account Number: [id number] Date of Birth/Sex: Treating RN: 22-Aug-1951 (  71 y.o. [name] Primary Care Sabastien Tyler: PCP, NO Other Clinician: Referring Thamar Holik: Treating Glenwood Revoir/Extender: [name]: 1 Active Problems Location of Pain Severity and Description of Pain Patient Has Paino No Site Locations Pain Management and Medication Current Pain Management: Electronic Signature(s) Signed: 06/10/2022 4:43:44 PM By: Lorrin Jackson Entered By: Lorrin Jackson on 06/10/2022 13:09:45 -------------------------------------------------------------------------------- Patient/Caregiver Education Details Patient Name: Date of Service: [name], Allen [name] 6/29/2023andnbsp12:30 PM Medical Record Number: [id number] Patient Account Number: [id number] Date of Birth/Gender: Treating RN: Oct 17, 1951 (71 y.o. [name] Primary Care Physician: PCP, NO Other Clinician: Referring Physician: Treating Physician/Extender: [name] in Treatment: 1 Education Assessment Education Provided To: Patient Education Topics Provided Wound/Skin Impairment: Handouts: Skin Care Do's and Dont's Methods: Explain/Verbal Responses: Reinforcements needed Electronic Signature(s) Signed: 06/10/2022 4:43:44 PM By: Lorrin Jackson Entered By: Lorrin Jackson on 06/10/2022 13:23:51 -------------------------------------------------------------------------------- Wound Assessment Details Patient Name: Date of Service: [name], Allen [name] 06/10/2022 12:30 PM Medical Record Number: [id number] Patient Account Number: [id number] Date of Birth/Sex: Treating RN: 04-15-1951 (71 y.o. [name] Primary Care Seana Underwood: PCP, NO Other Clinician: Referring Makayla Confer: Treating Princeton Nabor/Extender: [name]: 1 Wound Status Wound Number: 10 Primary Venous Leg Ulcer Etiology: Wound Location: Right, Lateral Lower Leg Wound Open Wounding Event: Blister Status: Date Acquired: 04/26/2022 Comorbid Cataracts, Lymphedema, Arrhythmia, Peripheral Venous Disease, Weeks Of Treatment: 1 History: Type II Diabetes, Osteoarthritis, Neuropathy Clustered Wound: No Photos Wound Measurements Length: (cm) 0.4 Width: (cm) 0.4 Depth: (cm) 0.1 Area: (cm) 0.126 Volume: (cm) 0.013 % Reduction in Area: 98.7% % Reduction in Volume: 98.7% Epithelialization: Large (67-100%) Tunneling:  No Undermining: No Wound Description Classification: Full Thickness Without Exposed Support Structures Wound Margin: Distinct, outline attached Exudate Amount: None Present Foul Odor After Cleansing: No Slough/Fibrino No Wound Bed Granulation Amount: Large (67-100%) Exposed Structure Granulation Quality: Pink Fascia Exposed: No Necrotic Amount: None Present (0%) Fat Layer (Subcutaneous Tissue) Exposed: Yes Tendon Exposed: No Muscle Exposed: No Joint Exposed: No Bone Exposed: No Electronic Signature(s) Signed: 06/10/2022 5:00:34 PM By: Deon Pilling RN, BSN Entered By: Deon Pilling on 06/10/2022 13:16:23 -------------------------------------------------------------------------------- Wound Assessment Details Patient Name: Date of Service: [name], Allen [name] 06/10/2022 12:30 PM Medical Record Number: [id number] Patient Account Number: [id number] Date of Birth/Sex: Treating RN: 1951/11/16 (71 y.o. [name] Primary Care Halvor Behrend: PCP, NO Other Clinician: Referring Malanie Koloski: Treating Xochilt Conant/Extender: [name]: 1 Wound Status Wound Number: 11 Primary Venous Leg Ulcer Etiology: Wound Location: Right, Anterior Lower Leg Wound Healed - Epithelialized Wounding Event: Blister Status: Date Acquired: 04/26/2022 Comorbid Cataracts, Lymphedema, Arrhythmia, Peripheral Venous Disease, Weeks Of Treatment: 1 History: Type II Diabetes, Osteoarthritis, Neuropathy Clustered Wound: No Photos Wound Measurements Length: (cm) Width: (cm) Depth: (cm) Area: (cm) Volume: (cm) 0 % Reduction in Area: 100% 0 % Reduction in Volume: 100% 0 Epithelialization: Large (67-100%) 0 Tunneling: No 0 Undermining: No Wound Description Classification: Full Thickness Without Exposed Support Structures Wound Margin: Distinct, outline attached Exudate Amount: None Present Foul Odor After Cleansing: No Slough/Fibrino Yes Wound Bed Granulation Amount:  None Present (0%) Exposed Structure Necrotic Amount: None Present (0%) Fascia Exposed: No Fat Layer (Subcutaneous Tissue) Exposed: No Tendon Exposed: No Muscle Exposed: No Joint Exposed: No Bone Exposed: No Electronic Signature(s) Signed: 06/10/2022 5:00:34 PM By: Deon Pilling RN, BSN Entered By: Deon Pilling on 06/10/2022 13:16:55 -------------------------------------------------------------------------------- Wound Assessment Details Patient Name: Date of Service:  [place], Allen [name] 06/10/2022 12:30 PM Medical Record Number: [id number] Patient Account Number: [id number] Date of Birth/Sex: Treating RN: 11-12-1951 (71 y.o. [name] Primary Care Azani Brogdon: PCP, NO Other Clinician: Referring Danee Soller: Treating Graeme Menees/Extender: [name]: 1 Wound Status Wound Number: 12 Primary Venous Leg Ulcer Etiology: Wound Location: Right, Distal, Anterior Lower Leg Wound Healed - Epithelialized Wounding Event: Gradually Appeared Status: Date Acquired: 04/26/2022 Comorbid Cataracts, Lymphedema, Arrhythmia, Peripheral Venous Disease, Weeks Of Treatment: 1 History: Type II Diabetes, Osteoarthritis, Neuropathy Clustered Wound: No Photos Wound Measurements Length: (cm) Width: (cm) Depth: (cm) Area: (cm) Volume: (cm) 0 % Reduction in Area: 100% 0 % Reduction in Volume: 100% 0 Epithelialization: Large (67-100%) 0 Tunneling: No 0 Undermining: No Wound Description Classification: Full Thickness Without Exposed Support Structures Wound Margin: Well defined, not attached Exudate Amount: None Present Foul Odor After Cleansing: No Slough/Fibrino No Wound Bed Granulation Amount: None Present (0%) Exposed Structure Necrotic Amount: None Present (0%) Fascia Exposed: No Fat Layer (Subcutaneous Tissue) Exposed: No Tendon Exposed: No Muscle Exposed: No Joint Exposed: No Bone Exposed: No Electronic Signature(s) Signed: 06/10/2022 5:00:34 PM  By: Deon Pilling RN, BSN Entered By: Deon Pilling on 06/10/2022 13:17:30 -------------------------------------------------------------------------------- Wound Assessment Details Patient Name: Date of Service: [name], Allen [name] 06/10/2022 12:30 PM Medical Record Number: [id number] Patient Account Number: [id number] Date of Birth/Sex: Treating RN: 02/07/1951 (71 y.o. [name] Primary Care Kale Dols: PCP, NO Other Clinician: Referring Klea Nall: Treating Mitesh Rosendahl/Extender: [name]: 1 Wound Status Wound Number: 13 Primary Venous Leg Ulcer Etiology: Wound Location: Left, Lateral Lower Leg Wound Healed - Epithelialized Wounding Event: Blister Status: Date Acquired: 04/26/2022 Comorbid Cataracts, Lymphedema, Arrhythmia, Peripheral Venous Disease, Weeks Of Treatment: 1 History: Type II Diabetes, Osteoarthritis, Neuropathy Clustered Wound: No Photos Wound Measurements Length: (cm) Width: (cm) Depth: (cm) Area: (cm) Volume: (cm) 0 % Reduction in Area: 100% 0 % Reduction in Volume: 100% 0 Epithelialization: Large (67-100%) 0 Tunneling: No 0 Undermining: No Wound Description Classification: Full Thickness Without Exposed Support Structures Wound Margin: Distinct, outline attached Exudate Amount: None Present Foul Odor After Cleansing: No Slough/Fibrino No Wound Bed Granulation Amount: None Present (0%) Exposed Structure Necrotic Amount: None Present (0%) Fascia Exposed: No Fat Layer (Subcutaneous Tissue) Exposed: Yes Tendon Exposed: No Muscle Exposed: No Joint Exposed: No Bone Exposed: No Electronic Signature(s) Signed: 06/10/2022 5:00:34 PM By: Deon Pilling RN, BSN Entered By: Deon Pilling on 06/10/2022 13:17:58 -------------------------------------------------------------------------------- Vitals Details Patient Name: Date of Service: [name], Allen [name] 06/10/2022 12:30 PM Medical Record Number:  [id number] Patient Account Number: [id number] Date of Birth/Sex: Treating RN: 01/20/1951 (71 y.o. [name] Primary Care Dietrick Barris: PCP, NO Other Clinician: Referring Thaddeus Evitts: Treating Laury Huizar/Extender: [name]: 1 Vital Signs Time Taken: 13:06 Temperature (F): 98 Pulse (bpm): 81 Respiratory Rate (breaths/min): 18 Blood Pressure (mmHg): 120/71 Reference Range: 80 - 120 mg / dl Electronic Signature(s) Signed: 06/10/2022 4:43:44 PM By: Lorrin Jackson Entered By: Lorrin Jackson on 06/10/2022 13:09:39

## 2022-06-18 ENCOUNTER — Encounter (HOSPITAL_BASED_OUTPATIENT_CLINIC_OR_DEPARTMENT_OTHER): Payer: Medicare Other | Attending: Internal Medicine | Admitting: Internal Medicine

## 2022-06-18 DIAGNOSIS — E119 Type 2 diabetes mellitus without complications: Secondary | ICD-10-CM | POA: Diagnosis not present

## 2022-06-18 DIAGNOSIS — L97822 Non-pressure chronic ulcer of other part of left lower leg with fat layer exposed: Secondary | ICD-10-CM | POA: Diagnosis not present

## 2022-06-18 DIAGNOSIS — Z872 Personal history of diseases of the skin and subcutaneous tissue: Secondary | ICD-10-CM | POA: Insufficient documentation

## 2022-06-18 DIAGNOSIS — I87313 Chronic venous hypertension (idiopathic) with ulcer of bilateral lower extremity: Secondary | ICD-10-CM | POA: Diagnosis not present

## 2022-06-18 DIAGNOSIS — I890 Lymphedema, not elsewhere classified: Secondary | ICD-10-CM | POA: Insufficient documentation

## 2022-06-18 DIAGNOSIS — Z09 Encounter for follow-up examination after completed treatment for conditions other than malignant neoplasm: Secondary | ICD-10-CM | POA: Insufficient documentation

## 2022-06-18 DIAGNOSIS — I87303 Chronic venous hypertension (idiopathic) without complications of bilateral lower extremity: Secondary | ICD-10-CM | POA: Diagnosis not present

## 2022-06-18 DIAGNOSIS — L97812 Non-pressure chronic ulcer of other part of right lower leg with fat layer exposed: Secondary | ICD-10-CM | POA: Diagnosis not present

## 2022-06-18 NOTE — Progress Notes (Addendum)
[name], [name] ([id number]) Visit Report for 06/18/2022 Arrival Information Details Patient Name: Date of Service: [name] 06/18/2022 2:30 PM Medical Record Number: [id number] Patient Account Number: [id number] Date of Birth/Sex: Treating RN: 14-Jul-1951 (71 y.o. [name] Primary Care Shi Grose: PCP, NO Other Clinician: Referring Elodia Haviland: Treating Kathlen Sakurai/Extender: [name]: 2 Visit Information History Since Last Visit Added or deleted any medications: No Patient Arrived: Walker Any new allergies or adverse reactions: No Arrival Time: 14:39 Had a fall or experienced change in No Transfer Assistance: None activities of daily living that may affect Patient Identification Verified: Yes risk of falls: Secondary Verification Process Completed: Yes Signs or symptoms of abuse/neglect since last visito No Patient Requires Transmission-Based Precautions: No Hospitalized since last visit: No Patient Has Alerts: Yes Implantable device outside of the clinic excluding No Patient Alerts: Patient on Blood Thinner cellular tissue based products placed in the center since last visit: Has Dressing in Place as Prescribed: Yes Has Compression in Place as Prescribed: Yes Pain Present Now: No Electronic Signature(s) Signed: 06/18/2022 4:12:12 PM By: Lorrin Jackson Entered By: Lorrin Jackson on 06/18/2022 14:43:09 -------------------------------------------------------------------------------- Clinic Level of Care Assessment Details Patient Name: Date of Service: [name] [name] 06/18/2022 2:30 PM Medical Record Number: [id number] Patient Account Number: [id number] Date of Birth/Sex: Treating RN: 1951/08/22 (71 y.o. [name] Primary Care Synthia Fairbank: PCP, NO Other Clinician: Referring Genesi Stefanko: Treating Vianne Grieshop/Extender: [name]: 2 Clinic Level of Care Assessment Items TOOL 4 Quantity  Score X- 1 0 Use when only an EandM is performed on FOLLOW-UP visit ASSESSMENTS - Nursing Assessment / Reassessment '[]'$  - 0 Reassessment of Co-morbidities (includes updates in patient status) '[]'$  - 0 Reassessment of Adherence to Treatment Plan ASSESSMENTS - Wound and Skin A ssessment / Reassessment X - Simple Wound Assessment / Reassessment - one wound 1 5 '[]'$  - 0 Complex Wound Assessment / Reassessment - multiple wounds '[]'$  - 0 Dermatologic / Skin Assessment (not related to wound area) ASSESSMENTS - Focused Assessment '[]'$  - 0 Circumferential Edema Measurements - multi extremities '[]'$  - 0 Nutritional Assessment / Counseling / Intervention '[]'$  - 0 Lower Extremity Assessment (monofilament, tuning fork, pulses) '[]'$  - 0 Peripheral Arterial Disease Assessment (using hand held doppler) ASSESSMENTS - Ostomy and/or Continence Assessment and Care '[]'$  - 0 Incontinence Assessment and Management '[]'$  - 0 Ostomy Care Assessment and Management (repouching, etc.) PROCESS - Coordination of Care '[]'$  - 0 Simple Patient / Family Education for ongoing care X- 1 20 Complex (extensive) Patient / Family Education for ongoing care '[]'$  - 0 Staff obtains Programmer, systems, Records, T Results / Process Orders est '[]'$  - 0 Staff telephones HHA, Nursing Homes / Clarify orders / etc '[]'$  - 0 Routine Transfer to another Facility (non-emergent condition) '[]'$  - 0 Routine Hospital Admission (non-emergent condition) '[]'$  - 0 New Admissions / Biomedical engineer / Ordering NPWT Apligraf, etc. , '[]'$  - 0 Emergency Hospital Admission (emergent condition) X- 1 10 Simple Discharge Coordination '[]'$  - 0 Complex (extensive) Discharge Coordination PROCESS - Special Needs '[]'$  - 0 Pediatric / Minor Patient Management '[]'$  - 0 Isolation Patient Management '[]'$  - 0 Hearing / Language / Visual special needs '[]'$  - 0 Assessment of Community assistance (transportation, D/C planning, etc.) '[]'$  - 0 Additional assistance / Altered  mentation '[]'$  - 0 Support Surface(s) Assessment (bed, cushion, seat, etc.) INTERVENTIONS - Wound Cleansing / Measurement '[]'$  - 0 Simple Wound Cleansing - one  wound '[]'$  - 0 Complex Wound Cleansing - multiple wounds X- 1 5 Wound Imaging (photographs - any number of wounds) '[]'$  - 0 Wound Tracing (instead of photographs) '[]'$  - 0 Simple Wound Measurement - one wound '[]'$  - 0 Complex Wound Measurement - multiple wounds INTERVENTIONS - Wound Dressings '[]'$  - 0 Small Wound Dressing one or multiple wounds '[]'$  - 0 Medium Wound Dressing one or multiple wounds '[]'$  - 0 Large Wound Dressing one or multiple wounds '[]'$  - 0 Application of Medications - topical '[]'$  - 0 Application of Medications - injection INTERVENTIONS - Miscellaneous '[]'$  - 0 External ear exam '[]'$  - 0 Specimen Collection (cultures, biopsies, blood, body fluids, etc.) '[]'$  - 0 Specimen(s) / Culture(s) sent or taken to Lab for analysis '[]'$  - 0 Patient Transfer (multiple staff / Civil Service fast streamer / Similar devices) '[]'$  - 0 Simple Staple / Suture removal (25 or less) '[]'$  - 0 Complex Staple / Suture removal (26 or more) '[]'$  - 0 Hypo / Hyperglycemic Management (close monitor of Blood Glucose) '[]'$  - 0 Ankle / Brachial Index (ABI) - do not check if billed separately X- 1 5 Vital Signs Has the patient been seen at the hospital within the last three years: Yes Total Score: 45 Level Of Care: New/Established - Level 2 Electronic Signature(s) Signed: 06/18/2022 4:12:12 PM By: Lorrin Jackson Entered By: Lorrin Jackson on 06/18/2022 15:02:27 -------------------------------------------------------------------------------- Encounter Discharge Information Details Patient Name: Date of Service: [name], [name] 06/18/2022 2:30 PM Medical Record Number: [id number] Patient Account Number: [id number] Date of Birth/Sex: Treating RN: 28-Feb-1951 (71 y.o. [name] Primary Care Jilberto Vanderwall: PCP, NO Other Clinician: Referring Yachet Mattson: Treating  Rosene Pilling/Extender: [name]: 2 Encounter Discharge Information Items Discharge Condition: Stable Ambulatory Status: Walker Discharge Destination: Home Transportation: Private Auto Schedule Follow-up Appointment: No Clinical Summary of Care: Provided on 06/18/2022 Form Type Recipient Paper Patient Patient Electronic Signature(s) Signed: 06/18/2022 4:12:12 PM By: Lorrin Jackson Entered By: Lorrin Jackson on 06/18/2022 15:03:30 -------------------------------------------------------------------------------- Lower Extremity Assessment Details Patient Name: Date of Service: [name], [name] 06/18/2022 2:30 PM Medical Record Number: [id number] Patient Account Number: [id number] Date of Birth/Sex: Treating RN: 01-03-1951 (71 y.o. [name] Primary Care Krysta Bloomfield: PCP, NO Other Clinician: Referring Fathima Bartl: Treating Kaoir Loree/Extender: [name]: 2 Edema Assessment Assessed: [Left: Yes] [Right: No] Edema: [Left: Ye] [Right: s] Calf Left: Right: Point of Measurement: 37 cm From Medial Instep 38 cm Ankle Left: Right: Point of Measurement: 11 cm From Medial Instep 26.6 cm Knee To Floor Left: Right: From Medial Instep 43 cm Vascular Assessment Pulses: Dorsalis Pedis Palpable: [Left:Yes] Electronic Signature(s) Signed: 06/18/2022 4:12:12 PM By: Lorrin Jackson Entered By: Lorrin Jackson on 06/18/2022 14:49:49 -------------------------------------------------------------------------------- Multi Wound Chart Details Patient Name: Date of Service: [name], [name] 06/18/2022 2:30 PM Medical Record Number: [id number] Patient Account Number: [id number] Date of Birth/Sex: Treating RN: Jul 29, 1951 (71 y.o. [name] Primary Care Millie Forde: PCP, NO Other Clinician: Referring Rea Kalama: Treating Caprisha Bridgett/Extender: [name]: 2 Vital  Signs Height(in): Pulse(bpm): 80 Weight(lbs): Blood Pressure(mmHg): 117/74 Body Mass Index(BMI): Temperature(F): 98.9 Respiratory Rate(breaths/min): 18 Photos: [N/A:N/A] Right, Lateral Lower Leg N/A N/A Wound Location: Blister N/A N/A Wounding Event: Venous Leg Ulcer N/A N/A Primary Etiology: Cataracts, Lymphedema, Arrhythmia, N/A N/A Comorbid History: Peripheral Venous Disease, Type II Diabetes, Osteoarthritis, Neuropathy 04/26/2022 N/A N/A Date Acquired: 2 N/A N/A Weeks of Treatment: Healed - Epithelialized N/A N/A  Wound Status: No N/A N/A Wound Recurrence: 0x0x0 N/A N/A Measurements L x W x D (cm) 0 N/A N/A A (cm) : rea 0 N/A N/A Volume (cm) : 100.00% N/A N/A % Reduction in Area: 100.00% N/A N/A % Reduction in Volume: Full Thickness Without Exposed N/A N/A Classification: Support Structures None Present N/A N/A Exudate Amount: Distinct, outline attached N/A N/A Wound Margin: Large (67-100%) N/A N/A Granulation Amount: Pink N/A N/A Granulation Quality: None Present (0%) N/A N/A Necrotic Amount: Fat Layer (Subcutaneous Tissue): Yes N/A N/A Exposed Structures: Fascia: No Tendon: No Muscle: No Joint: No Bone: No Large (67-100%) N/A N/A Epithelialization: Treatment Notes Electronic Signature(s) Signed: 06/18/2022 3:10:50 PM By: Kalman Shan DO Signed: 06/18/2022 4:12:12 PM By: Lorrin Jackson Entered By: Kalman Shan on 06/18/2022 15:05:52 -------------------------------------------------------------------------------- Multi-Disciplinary Care Plan Details Patient Name: Date of Service: [name], [name] 06/18/2022 2:30 PM Medical Record Number: [id number] Patient Account Number: [id number] Date of Birth/Sex: Treating RN: [birth year] (71 y.o. [name] Primary Care Early Ord: PCP, NO Other Clinician: Referring Kaid Seeberger: Treating Mykai Wendorf/Extender: [name]: 2 Active Inactive Electronic  Signature(s) Signed: 06/18/2022 4:12:12 PM By: Lorrin Jackson Previous Signature: 06/18/2022 2:36:30 PM Version By: Lorrin Jackson Entered By: Lorrin Jackson on 06/18/2022 15:22:59 -------------------------------------------------------------------------------- Pain Assessment Details Patient Name: Date of Service: [name], [name] 06/18/2022 2:30 PM Medical Record Number: [id number] Patient Account Number: [id number] Date of Birth/Sex: Treating RN: [birth year] (71 y.o. [name] Primary Care Gaynel Schaafsma: PCP, NO Other Clinician: Referring Dealva Lafoy: Treating Hiroki Wint/Extender: [name]: 2 Active Problems Location of Pain Severity and Description of Pain Patient Has Paino No Site Locations Pain Management and Medication Current Pain Management: Electronic Signature(s) Signed: 06/18/2022 4:12:12 PM By: Lorrin Jackson Entered By: Lorrin Jackson on 06/18/2022 14:45:40 -------------------------------------------------------------------------------- Patient/Caregiver Education Details Patient Name: Date of Service: [name], [name] 7/7/2023andnbsp2:30 PM Medical Record Number: [id number] Patient Account Number: [id number] Date of Birth/Gender: Treating RN: Sep 30, 1951 (71 y.o. [name] Primary Care Physician: PCP, NO Other Clinician: Referring Physician: Treating Physician/Extender: [name] in Treatment: 2 Education Assessment Education Provided To: Patient Education Topics Provided Venous: Methods: Explain/Verbal, Printed Responses: State content correctly Wound/Skin Impairment: Methods: Explain/Verbal, Printed Responses: State content correctly Electronic Signature(s) Signed: 06/18/2022 4:12:12 PM By: Lorrin Jackson Entered By: Lorrin Jackson on 06/18/2022 14:36:50 -------------------------------------------------------------------------------- Wound Assessment Details Patient Name: Date of  Service: [name], [name] 06/18/2022 2:30 PM Medical Record Number: [id number] Patient Account Number: [id number] Date of Birth/Sex: Treating RN: 30-Oct-1951 (71 y.o. [name] Primary Care Saray Capasso: PCP, NO Other Clinician: Referring Kohlton Gilpatrick: Treating Kieara Schwark/Extender: [name]: 2 Wound Status Wound Number: [id number] Primary Venous Leg Ulcer Etiology: Wound Location: Right, Lateral Lower Leg Wound Healed - Epithelialized Wounding Event: Blister Status: Date Acquired: 04/26/2022 Comorbid Cataracts, Lymphedema, Arrhythmia, Peripheral Venous Disease, Weeks Of Treatment: 2 History: Type II Diabetes, Osteoarthritis, Neuropathy Clustered Wound: No Photos Wound Measurements Length: (cm) Width: (cm) Depth: (cm) Area: (cm) Volume: (cm) 0 % Reduction in Area: 100% 0 % Reduction in Volume: 100% 0 Epithelialization: Large (67-100%) 0 0 Wound Description Classification: Full Thickness Without Exposed Support Structures Wound Margin: Distinct, outline attached Exudate Amount: None Present Foul Odor After Cleansing: No Slough/Fibrino No Wound Bed Granulation Amount: Large (67-100%) Exposed Structure Granulation Quality: Pink Fascia Exposed: No Necrotic Amount: None Present (0%) Fat Layer (Subcutaneous Tissue) Exposed: Yes Tendon Exposed: No Muscle  Exposed: No Joint Exposed: No Bone Exposed: No Electronic Signature(s) Signed: 06/18/2022 4:12:12 PM By: Lorrin Jackson Entered By: Lorrin Jackson on 06/18/2022 15:02:47 -------------------------------------------------------------------------------- Vitals Details Patient Name: Date of Service: [name], [name] 06/18/2022 2:30 PM Medical Record Number: [id number] Patient Account Number: [id number] Date of Birth/Sex: Treating RN: 1951/06/17 (71 y.o. [name] Primary Care Klyn Kroening: PCP, NO Other Clinician: Referring Baneza Bartoszek: Treating Cayle Thunder/Extender: [name]: 2 Vital Signs Time Taken: 14:44 Temperature (F): 98.9 Pulse (bpm): 80 Respiratory Rate (breaths/min): 18 Blood Pressure (mmHg): 117/74 Reference Range: 80 - 120 mg / dl Electronic Signature(s) Signed: 06/18/2022 4:12:12 PM By: Lorrin Jackson Entered By: Lorrin Jackson on 06/18/2022 14:45:33

## 2022-06-18 NOTE — Progress Notes (Signed)
Allen, Reese ([id number]) Visit Report for 06/18/2022 Chief Complaint Document Details Patient Name: Date of Service: [name] 06/18/2022 2:30 PM Medical Record Number: [id number] Patient Account Number: [id number] Date of Birth/Sex: Treating RN: 17-Jan-1951 (71 y.o. [name] Primary Care Provider: PCP, NO Other Clinician: Referring Provider: Treating Provider/Extender: [name]: 2 Information Obtained from: Patient Chief Complaint 02/03/2022: The patient is here today for reevaluation of bilateral lower extremity edema and venous stasis dermatitis Electronic Signature(s) Signed: 06/18/2022 3:10:50 PM By: Kalman Shan DO Entered By: Kalman Shan on 06/18/2022 15:06:20 -------------------------------------------------------------------------------- HPI Details Patient Name: Date of Service: [name], [name] 06/18/2022 2:30 PM Medical Record Number: [id number] Patient Account Number: [id number] Date of Birth/Sex: Treating RN: 1951-06-29 (71 y.o. [name] Primary Care Provider: PCP, NO Other Clinician: Referring Provider: Treating Provider/Extender: [name]: 2 History of Present Illness HPI Description: [name] is a 71 year old male with a past medical history of type 2 diabetes and essential hypertension who presents to the clinic today for bilateral lower extremity wounds. He was recently seen in the ED on 3/9 for bilateral lower extremity edema. He had a DVT study that was negative for clots. He was given triamcinolone cream and advised to elevate legs and obtain compression stockings. He has been using the triamcinolone cream however he is unable to use compression stockings due to difficulty of putting on. Patient states that for the past 2 years he has had opening and closing of weeping wounds to his legs bilaterally. They spontaneously open and close. They  are often open for several weeks before they heal spontaneously. He denies any purulent drainage, increased warmth or increased erythema to the skin. 4/21; patient presents for 1 week follow-up. The wounds on the right leg have healed however he has now 2 new wounds 1 on the anterior and posterior thigh that occurred from him scratching underneath the wrap. The left leg wound has improved and almost closed 4/28; patient presents for 1 week follow-up. He continues to have a wound on the right anterior leg that is showing signs of healing. The posterior right thigh has closed. He now has a new wound to the left leg that is limited to skin breakdown. 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 The wounds are occurring because he goes underneath the leg wraps and scratches creating new wounds. He has no complaints today. 5/5; patient presents for 1 week follow-up. He no longer has any open wounds to his lower extremities bilaterally. He continues to have scattered excoriation marks from where he scratches. These are scabbed over. He states he is going to see dermatology Reese month. Overall he is doing well and is happy with his care. Readmission 6/10 Patient was followed for open wounds limited to skin breakdown to his lower extremities bilaterally 2/2 venous insufficiency. Once the wounds healed he was discharged with juxta lite compressions and these were placed in office. He was discharged on 5/5. He states that he has not taken the wraps off since discharge and has had them on for over a month. He did not want to take them off because they were very comfortable. He does not have any difficulty putting these on and off if he wanted to. He states that he does not take a shower and just cleans the other parts of his body  at the sink. He noticed that 2 days ago he was having some drainage at the top of the wrap and called in to be  evaluated. He states that he has not seen the wounds until the wraps were taken off today in office. He denies pain or increased warmth or erythema to the area 6/27; patient presents for 2-week follow-up. He has had increased swelling and drainage to the wounds over the last week. He reports increased erythema to the area. He does not have pain however. He denies purulent drainage or fever/chills. 7/5; patient presents for 1 week follow-up. He was prescribed Keflex at last clinic visit and is currently taking Reese. He reports significant improvement to his leg swelling, tenderness and erythema. He denies signs of infection. 7/21; patient presents for 2-week follow-up. He reports improvement in healing in his right lower extremity. Unfortunately Reese morning he noticed skin breakdown to his left lower extremity with increased redness and warmth. 7/28; patient presents for 1 week follow-up. He reports completing his antibiotics. He states that his wounds are healed. He has been using his juxta light compressions. He has no issues or complaints today. Readmission 12/17/2021; Reese is a 71 year old man who has been seen twice by Dr. Heber [place] for wounds on his bilateral lower extremities secondary to chronic venous insufficiency. [name] was from 05/22/2021 through 07/09/2021 with bilateral lower extremity wounds largely secondary to chronic venous insufficiency with stasis dermatitis. He has been discharged with juxta lite stockings. The patient tells me that about a month ago he developed too much edema to get his juxta lites on. He developed increased swelling. He saw his primary physician who gave him Lasix and the swelling seems to have come down. 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 He says Reese dates back into his adolescence and he could not walk barefoot etc. As a result of Reese he sometimes leaves his shoes and socks on for weeks  and he is done that recently.[name] He arrives in clinic with not any wounds on his legs however with close inspection he had a fairly deep wound in the left second and third toe webspace His skin on his feet was not in good shape macerated flaking malodorous The patient's past medical history has not changed. I note that he had arterial studies done on 06/03/2021 that showed an ABI on the right of 1.26 with triphasic waveforms TBI of 0.63 on the right on the left ABI of 0.85 TBI of 0.63 again with triphasic waveforms 1/19; since the patient was last here he was seen in the hospital for bradycardia. He had his medications adjusted digoxin metoprolol. He tells me they dressed the wound between the left second and third toe. The patient says that he had open wounds on his dorsal foot when we took off the dressing today. I was not exactly sure what he had on their. He had 2 wounds on the left dorsal foot with some significant surrounding erythema.The wound between the left second and third toe was just about closed however when we opened it to examine it today it it opened and a small area. The skin on his lower legs especially the left looks a lot better using triamcinolone and Cetaphil which she is applying He was on cephalexin prior described I think by primary care before he came here he completed Reese about 3 or 4 days ago 1/26; the area between the left second and  third toes is healed. He still has 2 small areas on the left dorsal foot. The erythema that I marked around Reese last week seems better. He is completing his doxycycline. I also gave him triamcinolone and Cetaphil last week for the dry flaking irritated inflamed skin on his dorsal feet and lower legs Reese all seems better. He has been wearing his juxta lite stockings 2/2; everything is healed here. He is now treating his skin with I think both the triamcinolone and Cetaphil mixture that I prescribed and probably pure triamcinolone that the  dermatologist prescribed. I told him to use my compounded steroid cream as a moisture and anti-itch and inflammation cream when he is finished without to go to a pure moisturizer I emphasized that steroids under compression even if its a juxta lite stocking can get absorbed and secondarily can cause skin atrophy. I asked him not to use both. On an optimistic front he seems a lot more interested in treating his skin and watching for areas on his feet that are breaking down. His skin looks a lot better he has a juxta lite stocking 2/14; patient we discharged 2 weeks ago. He has noted increasing edema in the right greater than left leg erythema and intense pruritus. Arrives in clinic really with no open wound however considerable amount of marks on his upper right leg from scratching. He claims compliance with his juxta lites up with up to a week ago on the right. He has been wearing 1 on the left. He has home health nurse put kerlix on Reese 02/03/2022: No open wounds. He has been in 3 layer compression, which is slipped down and caused some "mushrooming" affect from just below the tibial tuberosity to the knee. The excoriations on his legs have resolved. He reports being compliant with moisturizing and elevation of his legs. He saw his cardiologist yesterday and was prescribed 20 mg of Lasix to be used on an as-needed basis. Readmission 06/01/2022 Mr. Raiden Haydu is a 71 year old male with a past medical history of venous insufficiency, lymphedema, type 2 diabetes that presents to the clinic for a 1 month history of worsening leg wounds. He states he has not been wearing his compression wraps for the past 2 months. He states his Lasix was stopped by his primary doctor and then his legs became more swollen and he could not wear his compression stockings. He states eventually he was put back on Lasix. He still has been unable to wear his compression stockings and although his swelling has improved slightly  his wounds have not improved. He has increased warmth and erythema to the right lower extremity. 6/29; patient presents for follow-up. He completed his course of Keflex without issues. We have been using silver alginate with antibiotic ointment under compression therapy. He has no issues or complaints today. He has his juxta lite compressions with him today. 7/7; patient presents for follow-up. We have been using silver alginate under 3 layer compression. Patient has done well with Reese. His wounds have healed. He states he is going to get custom garment compression wraps. For now he has his juxta lite compressions. Electronic Signature(s) Signed: 06/18/2022 3:10:50 PM By: Kalman Shan DO Entered By: Kalman Shan on 06/18/2022 15:07:08 -------------------------------------------------------------------------------- Physical Exam Details Patient Name: Date of Service: [name], [name] 06/18/2022 2:30 PM Medical Record Number: [id number] Patient Account Number: [id number] Date of Birth/Sex: Treating RN: [birth year] (71 y.o. [name] Primary Care Provider: PCP, NO Other Clinician: Referring  Provider: Treating Provider/Extender: [name]: 2 Constitutional respirations regular, non-labored and within target range for patient.. Cardiovascular 2+ dorsalis pedis/posterior tibialis pulses. Psychiatric pleasant and cooperative. Notes Right lower extremity: Epithelization to the previous wound sites. No open wounds present. Good edema control. Electronic Signature(s) Signed: 06/18/2022 3:10:50 PM By: Kalman Shan DO Entered By: Kalman Shan on 06/18/2022 15:07:41 -------------------------------------------------------------------------------- Physician Orders Details Patient Name: Date of Service: [name], [name] 06/18/2022 2:30 PM Medical Record Number: [id number] Patient Account Number: [id number] Date of Birth/Sex: Treating  RN: [birth year] (71 y.o. [name] Primary Care Provider: PCP, NO Other Clinician: Referring Provider: Treating Provider/Extender: [name]: 2 Verbal / Phone Orders: No Diagnosis Coding ICD-10 Coding Code Description [phone] Non-pressure chronic ulcer of other part of right lower leg with fat layer exposed L97.822 Non-pressure chronic ulcer of other part of left lower leg with fat layer exposed I87.313 Chronic venous hypertension (idiopathic) with ulcer of bilateral lower extremity I89.0 Lymphedema, not elsewhere classified E11.622 Type 2 diabetes mellitus with other skin ulcer Discharge From [facility] Discharge from [facility] - No further follow up needed, call if anything changes. Edema Control - Lymphedema / SCD / Other Avoid standing for long periods of time. Patient to wear own compression stockings every day. Moisturize legs daily. Other Edema Control Orders/Instructions: - If [facility] can do custom compression stockings then we recommend going there for compression stockings 30-54mHg. If they do not do custom stockings then just use Juxtalite compression stockings. Electronic Signature(s) Signed: 06/18/2022 3:10:50 PM By: HKalman ShanDO Entered By: HKalman Shanon 06/18/2022 15:07:49 -------------------------------------------------------------------------------- Problem List Details Patient Name: Date of Service: BTeodoro Reese [name] 06/18/2022 2:30 PM Medical Record Number: [id number] Account Number: [id number] of Birth/Sex: Treating RN: 1July 05, 1952(71y.o. MMarcheta GrammesPrimary Care Provider: PCP, NO Other Clinician: Referring Provider: Treating Provider/Extender: HAlbertine PatriciaWeeks in Treatment: 2 Active Problems ICD-10 Encounter Code Description Active Date MDM Diagnosis [phone]-pressure chronic ulcer of other part of right lower leg with fat layer 06/01/2022 No  Yes exposed L97.822 Non-pressure chronic ulcer of other part of left lower leg with fat layer exposed6/20/2023 No Yes I87.313 Chronic venous hypertension (idiopathic) with ulcer of bilateral lower extremity 06/01/2022 No Yes I89.0 Lymphedema, not elsewhere classified 06/01/2022 No Yes E11.622 Type 2 diabetes mellitus with other skin ulcer 06/01/2022 No Yes Inactive Problems Resolved Problems Electronic Signature(s) Signed: 06/18/2022 3:10:50 PM By: HKalman ShanDO Previous Signature: 06/18/2022 2:36:19 PM Version By: BLorrin JacksonEntered By: HKalman Shanon 06/18/2022 15:05:47 -------------------------------------------------------------------------------- Progress Note Details Patient Name: Date of Service: BTeodoro Reese [name] 06/18/2022 2:30 PM Medical Record Number: [id number] Account Number: [id number] of Birth/Sex: Treating RN: [birth year](71y.o. MMarcheta GrammesPrimary Care Provider: PCP, NO Other Clinician: Referring Provider: Treating Provider/Extender: HAlbertine PatriciaWeeks in Treatment: 2 Subjective Chief Complaint Information obtained from Patient 02/03/2022: The patient is here today for reevaluation of bilateral lower extremity edema and venous stasis dermatitis History of Present Illness (HPI) DSabre Leonettiis a 71year old male with a past medical history of type 2 diabetes and essential hypertension who presents to the clinic today for bilateral lower extremity wounds. He was recently seen in the ED on 3/9 for bilateral lower extremity edema. He had a DVT study that was negative for clots. He was given triamcinolone cream and advised to elevate legs and  obtain compression stockings. He has been using the triamcinolone cream however he is unable to use compression stockings due to difficulty of putting on. Patient states that for the past 2 years he has had opening and closing of weeping wounds to his legs bilaterally. They spontaneously  open and close. They are often open for several weeks before they heal spontaneously. He denies any purulent drainage, increased warmth or increased erythema to the skin. 4/21; patient presents for 1 week follow-up. The wounds on the right leg have healed however he has now 2 new wounds 1 on the anterior and posterior thigh that occurred from him scratching underneath the wrap. The left leg wound has improved and almost closed 4/28; patient presents for 1 week follow-up. He continues to have a wound on the right anterior leg that is showing signs of healing. The posterior right thigh has closed. He now has a new wound to the left leg that is limited to skin breakdown. 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 The wounds are occurring because he goes underneath the leg wraps and scratches creating new wounds. He has no complaints today. 5/5; patient presents for 1 week follow-up. He no longer has any open wounds to his lower extremities bilaterally. He continues to have scattered excoriation marks from where he scratches. These are scabbed over. He states he is going to see dermatology Reese month. Overall he is doing well and is happy with his care. Readmission 6/10 Patient was followed for open wounds limited to skin breakdown to his lower extremities bilaterally 2/2 venous insufficiency. Once the wounds healed he was discharged with juxta lite compressions and these were placed in office. He was discharged on 5/5. He states that he has not taken the wraps off since discharge and has had them on for over a month. He did not want to take them off because they were very comfortable. He does not have any difficulty putting these on and off if he wanted to. He states that he does not take a shower and just cleans the other parts of his body at the sink. He noticed that 2 days ago he was having some drainage at the top of the wrap and  called in to be evaluated. He states that he has not seen the wounds until the wraps were taken off today in office. He denies pain or increased warmth or erythema to the area 6/27; patient presents for 2-week follow-up. He has had increased swelling and drainage to the wounds over the last week. He reports increased erythema to the area. He does not have pain however. He denies purulent drainage or fever/chills. 7/5; patient presents for 1 week follow-up. He was prescribed Keflex at last clinic visit and is currently taking Reese. He reports significant improvement to his leg swelling, tenderness and erythema. He denies signs of infection. 7/21; patient presents for 2-week follow-up. He reports improvement in healing in his right lower extremity. Unfortunately Reese morning he noticed skin breakdown to his left lower extremity with increased redness and warmth. 7/28; patient presents for 1 week follow-up. He reports completing his antibiotics. He states that his wounds are healed. He has been using his juxta light compressions. He has no issues or complaints today. Readmission 12/17/2021; Reese is a 71 year old man who has been seen twice by Dr. Heber [place] for wounds on his bilateral lower extremities secondary to chronic venous insufficiency. [name] was  from 05/22/2021 through 07/09/2021 with bilateral lower extremity wounds largely secondary to chronic venous insufficiency with stasis dermatitis. He has been discharged with juxta lite stockings. The patient tells me that about a month ago he developed too much edema to get his juxta lites on. He developed increased swelling. He saw his primary physician who gave him Lasix and the swelling seems to have come down. 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 He says Reese dates back into his adolescence and he could not walk barefoot etc. As a result of Reese he sometimes leaves his shoes and  socks on for weeks and he is done that recently.[name] He arrives in clinic with not any wounds on his legs however with close inspection he had a fairly deep wound in the left second and third toe webspace His skin on his feet was not in good shape macerated flaking malodorous The patient's past medical history has not changed. I note that he had arterial studies done on 06/03/2021 that showed an ABI on the right of 1.26 with triphasic waveforms TBI of 0.63 on the right on the left ABI of 0.85 TBI of 0.63 again with triphasic waveforms 1/19; since the patient was last here he was seen in the hospital for bradycardia. He had his medications adjusted digoxin metoprolol. He tells me they dressed the wound between the left second and third toe. The patient says that he had open wounds on his dorsal foot when we took off the dressing today. I was not exactly sure what he had on their. He had 2 wounds on the left dorsal foot with some significant surrounding erythema.The wound between the left second and third toe was just about closed however when we opened it to examine it today it it opened and a small area. The skin on his lower legs especially the left looks a lot better using triamcinolone and Cetaphil which she is applying He was on cephalexin prior described I think by primary care before he came here he completed Reese about 3 or 4 days ago 1/26; the area between the left second and third toes is healed. He still has 2 small areas on the left dorsal foot. The erythema that I marked around Reese last week seems better. He is completing his doxycycline. I also gave him triamcinolone and Cetaphil last week for the dry flaking irritated inflamed skin on his dorsal feet and lower legs Reese all seems better. He has been wearing his juxta lite stockings 2/2; everything is healed here. He is now treating his skin with I think both the triamcinolone and Cetaphil mixture that I prescribed and probably  pure triamcinolone that the dermatologist prescribed. I told him to use my compounded steroid cream as a moisture and anti-itch and inflammation cream when he is finished without to go to a pure moisturizer I emphasized that steroids under compression even if its a juxta lite stocking can get absorbed and secondarily can cause skin atrophy. I asked him not to use both. On an optimistic front he seems a lot more interested in treating his skin and watching for areas on his feet that are breaking down. His skin looks a lot better he has a juxta lite stocking 2/14; patient we discharged 2 weeks ago. He has noted increasing edema in the right greater than left leg erythema and intense pruritus. Arrives in clinic really with no open wound however considerable amount of marks on  his upper right leg from scratching. He claims compliance with his juxta lites up with up to a week ago on the right. He has been wearing 1 on the left. He has home health nurse put kerlix on Reese 02/03/2022: No open wounds. He has been in 3 layer compression, which is slipped down and caused some "mushrooming" affect from just below the tibial tuberosity to the knee. The excoriations on his legs have resolved. He reports being compliant with moisturizing and elevation of his legs. He saw his cardiologist yesterday and was prescribed 20 mg of Lasix to be used on an as-needed basis. Readmission 06/01/2022 Mr. Wilhelm Ganaway is a 71 year old male with a past medical history of venous insufficiency, lymphedema, type 2 diabetes that presents to the clinic for a 1 month history of worsening leg wounds. He states he has not been wearing his compression wraps for the past 2 months. He states his Lasix was stopped by his primary doctor and then his legs became more swollen and he could not wear his compression stockings. He states eventually he was put back on Lasix. He still has been unable to wear his compression stockings and although his  swelling has improved slightly his wounds have not improved. He has increased warmth and erythema to the right lower extremity. 6/29; patient presents for follow-up. He completed his course of Keflex without issues. We have been using silver alginate with antibiotic ointment under compression therapy. He has no issues or complaints today. He has his juxta lite compressions with him today. 7/7; patient presents for follow-up. We have been using silver alginate under 3 layer compression. Patient has done well with Reese. His wounds have healed. He states he is going to get custom garment compression wraps. For now he has his juxta lite compressions. Patient History Unable to Obtain Patient History due to Altered Mental Status. Information obtained from Patient. Family History Cancer - Mother,Maternal Grandparents, Heart Disease - Maternal Grandparents, Hypertension - Maternal Grandparents, No family history of Diabetes, Hereditary Spherocytosis. Social History Former smoker - ended on 09/14/1983, Marital Status - Single, Alcohol Use - Rarely, Drug Use - No History, Caffeine Use - Never. Medical History Eyes Patient has history of Cataracts - 2014 Hematologic/Lymphatic Patient has history of Lymphedema Cardiovascular Patient has history of Arrhythmia - A-Fib, Peripheral Venous Disease Endocrine Patient has history of Type II Diabetes Musculoskeletal Patient has history of Osteoarthritis Neurologic Patient has history of Neuropathy Denies history of Quadriplegia, Paraplegia, Seizure Disorder Oncologic Denies history of Received Chemotherapy, Received Radiation Psychiatric Denies history of Anorexia/bulimia, Confinement Anxiety Hospitalization/Surgery History - colon resection. - appendectomy. - multiple tooth extraction. Medical A Surgical History Notes nd Hematologic/Lymphatic Hyperbilirubinemia Gastrointestinal Rectal Pain-Constipation Genitourinary CKD stage  3-BPH Oncologic Colon Cancer-Colon Re-section Objective Constitutional respirations regular, non-labored and within target range for patient.. Vitals Time Taken: 2:44 PM, Temperature: 98.9 F, Pulse: 80 bpm, Respiratory Rate: 18 breaths/min, Blood Pressure: 117/74 mmHg. Cardiovascular 2+ dorsalis pedis/posterior tibialis pulses. Psychiatric pleasant and cooperative. General Notes: Right lower extremity: Epithelization to the previous wound sites. No open wounds present. Good edema control. Integumentary (Hair, Skin) Wound #10 status is Healed - Epithelialized. Original cause of wound was Blister. The date acquired was: 04/26/2022. The wound has been in treatment 2 weeks. The wound is located on the Right,Lateral Lower Leg. The wound measures 0cm length x 0cm width x 0cm depth; 0cm^2 area and 0cm^3 volume. There is Fat Layer (Subcutaneous Tissue) exposed. There is a none present amount of drainage noted.  The wound margin is distinct with the outline attached to the wound base. There is large (67-100%) pink granulation within the wound bed. There is no necrotic tissue within the wound bed. Assessment Active Problems ICD-10 Non-pressure chronic ulcer of other part of right lower leg with fat layer exposed Non-pressure chronic ulcer of other part of left lower leg with fat layer exposed Chronic venous hypertension (idiopathic) with ulcer of bilateral lower extremity Lymphedema, not elsewhere classified Type 2 diabetes mellitus with other skin ulcer Patient has done well with silver alginate under 3 layer compression. His wounds have healed. I recommended using his juxta lite compressions daily by the time he gets his custom compression garments. Plan Discharge From [facility] Services: Discharge from [facility] - No further follow up needed, call if anything changes. Edema Control - Lymphedema / SCD / Other: Avoid standing for long periods of time. Patient to wear own compression  stockings every day. Moisturize legs daily. Other Edema Control Orders/Instructions: - If [facility] can do custom compression stockings then we recommend going there for compression stockings 30-53mHg. If they do not do custom stockings then just use Juxtalite compression stockings. 1. Daily juxta lite compression garments bilaterally 2. Follow-up as needed 3. Discharge from clinic due to closed wound Electronic Signature(s) Signed: 06/18/2022 3:10:50 PM By: HKalman ShanDO Entered By: HKalman Shanon 06/18/2022 15:10:08 -------------------------------------------------------------------------------- HxROS Details Patient Name: Date of Service: BTeodoro Reese [name] 06/18/2022 2:30 PM Medical Record Number: [id number] Account Number: [id number] of Birth/Sex: Treating RN: [id number](71y.o. MMarcheta GrammesPrimary Care Provider: PCP, NO Other Clinician: Referring Provider: Treating Provider/Extender: HAlbertine PatriciaWeeks in Treatment: 2 Unable to Obtain Patient History due to Altered Mental Status Information Obtained From Patient Eyes Medical History: Positive for: Cataracts - 2014 Hematologic/Lymphatic Medical History: Positive for: Lymphedema Past Medical History Notes: Hyperbilirubinemia Cardiovascular Medical History: Positive for: Arrhythmia - A-Fib; Peripheral Venous Disease Gastrointestinal Medical History: Past Medical History Notes: Rectal Pain-Constipation Endocrine Medical History: Positive for: Type II Diabetes Time with diabetes: Since 2014 Treated with: Oral agents Blood sugar tested every day: Yes Tested : Genitourinary Medical History: Past Medical History Notes: CKD stage 3-BPH Musculoskeletal Medical History: Positive for: Osteoarthritis Neurologic Medical History: Positive for: Neuropathy Negative for: Quadriplegia; Paraplegia; Seizure Disorder Oncologic Medical History: Negative for: Received  Chemotherapy; Received Radiation Past Medical History Notes: Colon Cancer-Colon Re-section Psychiatric Medical History: Negative for: Anorexia/bulimia; Confinement Anxiety HBO Extended History Items Eyes: Cataracts Immunizations Pneumococcal Vaccine: Received Pneumococcal Vaccination: No Implantable Devices None Hospitalization / Surgery History Type of Hospitalization/Surgery colon resection appendectomy multiple tooth extraction Family and Social History Cancer: Yes - Mother,Maternal Grandparents; Diabetes: No; Heart Disease: Yes - Maternal Grandparents; Hereditary Spherocytosis: No; Hypertension: Yes - Maternal Grandparents; Former smoker - ended on 09/14/1983; Marital Status - Single; Alcohol Use: Rarely; Drug Use: No History; Caffeine Use: Never; Financial Concerns: No; Food, Clothing or Shelter Needs: No; Support System Lacking: No; Transportation Concerns: No Electronic Signature(s) Signed: 06/18/2022 3:10:50 PM By: HKalman ShanDO Signed: 06/18/2022 4:12:12 PM By: BLorrin JacksonEntered By: HKalman Shanon 06/18/2022 15:07:20 -------------------------------------------------------------------------------- SuperBill Details Patient Name: Date of Service: BTeodoro Reese [name] 06/18/2022 Medical Record Number: [id number] Account Number: [id number] of Birth/Sex: Treating RN: [id number](71y.o. MMarcheta GrammesPrimary Care Provider: PCP, NO Other Clinician: Referring Provider: Treating Provider/Extender: HAlbertine PatriciaWeeks in Treatment: 2 Diagnosis Coding ICD-10 Codes Code Description [phone]-pressure chronic ulcer of  other part of right lower leg with fat layer exposed L97.822 Non-pressure chronic ulcer of other part of left lower leg with fat layer exposed I87.313 Chronic venous hypertension (idiopathic) with ulcer of bilateral lower extremity I89.0 Lymphedema, not elsewhere classified E11.622 Type 2 diabetes mellitus with other  skin ulcer Facility Procedures CPT4 Code: [id number] Description: [id number] - WOUND CARE VISIT-LEV 2 EST PT Modifier: Quantity: 1 Physician Procedures : CPT4 Code Description Modifier [id number] - WC PHYS LEVEL 3 - EST PT ICD-10 Diagnosis Description Y63.785 Non-pressure chronic ulcer of other part of right lower leg with fat layer exposed L97.822 Non-pressure chronic ulcer of other part of left  lower leg with fat layer exposed I87.313 Chronic venous hypertension (idiopathic) with ulcer of bilateral lower extremity I89.0 Lymphedema, not elsewhere classified Quantity: 1 Electronic Signature(s) Signed: 06/18/2022 3:10:50 PM By: Kalman Shan DO Entered By: Kalman Shan on 06/18/2022 15:10:30

## 2022-09-09 ENCOUNTER — Observation Stay (HOSPITAL_COMMUNITY)
Admission: EM | Admit: 2022-09-09 | Discharge: 2022-09-13 | Payer: Medicare HMO | Attending: Student | Admitting: Student

## 2022-09-09 ENCOUNTER — Emergency Department (HOSPITAL_COMMUNITY): Payer: Medicare HMO

## 2022-09-09 ENCOUNTER — Encounter (HOSPITAL_COMMUNITY): Payer: Self-pay | Admitting: Pharmacy Technician

## 2022-09-09 ENCOUNTER — Other Ambulatory Visit: Payer: Self-pay

## 2022-09-09 DIAGNOSIS — E119 Type 2 diabetes mellitus without complications: Secondary | ICD-10-CM | POA: Diagnosis present

## 2022-09-09 DIAGNOSIS — Z79899 Other long term (current) drug therapy: Secondary | ICD-10-CM | POA: Insufficient documentation

## 2022-09-09 DIAGNOSIS — E669 Obesity, unspecified: Secondary | ICD-10-CM

## 2022-09-09 DIAGNOSIS — N401 Enlarged prostate with lower urinary tract symptoms: Secondary | ICD-10-CM | POA: Insufficient documentation

## 2022-09-09 DIAGNOSIS — L259 Unspecified contact dermatitis, unspecified cause: Secondary | ICD-10-CM | POA: Insufficient documentation

## 2022-09-09 DIAGNOSIS — Z7901 Long term (current) use of anticoagulants: Secondary | ICD-10-CM | POA: Diagnosis not present

## 2022-09-09 DIAGNOSIS — Z593 Problems related to living in residential institution: Secondary | ICD-10-CM

## 2022-09-09 DIAGNOSIS — I5032 Chronic diastolic (congestive) heart failure: Secondary | ICD-10-CM

## 2022-09-09 DIAGNOSIS — I480 Paroxysmal atrial fibrillation: Secondary | ICD-10-CM | POA: Diagnosis present

## 2022-09-09 DIAGNOSIS — K760 Fatty (change of) liver, not elsewhere classified: Secondary | ICD-10-CM

## 2022-09-09 DIAGNOSIS — Z87442 Personal history of urinary calculi: Secondary | ICD-10-CM

## 2022-09-09 DIAGNOSIS — K8000 Calculus of gallbladder with acute cholecystitis without obstruction: Secondary | ICD-10-CM | POA: Diagnosis not present

## 2022-09-09 DIAGNOSIS — R1013 Epigastric pain: Secondary | ICD-10-CM | POA: Diagnosis present

## 2022-09-09 DIAGNOSIS — L97909 Non-pressure chronic ulcer of unspecified part of unspecified lower leg with unspecified severity: Secondary | ICD-10-CM

## 2022-09-09 DIAGNOSIS — N138 Other obstructive and reflux uropathy: Secondary | ICD-10-CM | POA: Diagnosis not present

## 2022-09-09 DIAGNOSIS — E871 Hypo-osmolality and hyponatremia: Secondary | ICD-10-CM

## 2022-09-09 DIAGNOSIS — R2681 Unsteadiness on feet: Secondary | ICD-10-CM | POA: Insufficient documentation

## 2022-09-09 DIAGNOSIS — Z87891 Personal history of nicotine dependence: Secondary | ICD-10-CM | POA: Diagnosis not present

## 2022-09-09 DIAGNOSIS — I10 Essential (primary) hypertension: Secondary | ICD-10-CM | POA: Diagnosis present

## 2022-09-09 DIAGNOSIS — N179 Acute kidney failure, unspecified: Secondary | ICD-10-CM | POA: Diagnosis not present

## 2022-09-09 DIAGNOSIS — K8020 Calculus of gallbladder without cholecystitis without obstruction: Secondary | ICD-10-CM

## 2022-09-09 DIAGNOSIS — Z20822 Contact with and (suspected) exposure to covid-19: Secondary | ICD-10-CM | POA: Diagnosis not present

## 2022-09-09 DIAGNOSIS — I83009 Varicose veins of unspecified lower extremity with ulcer of unspecified site: Secondary | ICD-10-CM

## 2022-09-09 DIAGNOSIS — E1169 Type 2 diabetes mellitus with other specified complication: Secondary | ICD-10-CM

## 2022-09-09 DIAGNOSIS — K5909 Other constipation: Secondary | ICD-10-CM

## 2022-09-09 DIAGNOSIS — R112 Nausea with vomiting, unspecified: Secondary | ICD-10-CM

## 2022-09-09 DIAGNOSIS — I4891 Unspecified atrial fibrillation: Secondary | ICD-10-CM | POA: Diagnosis not present

## 2022-09-09 DIAGNOSIS — D509 Iron deficiency anemia, unspecified: Secondary | ICD-10-CM

## 2022-09-09 DIAGNOSIS — K626 Ulcer of anus and rectum: Secondary | ICD-10-CM

## 2022-09-09 DIAGNOSIS — E785 Hyperlipidemia, unspecified: Secondary | ICD-10-CM | POA: Insufficient documentation

## 2022-09-09 DIAGNOSIS — Z8616 Personal history of COVID-19: Secondary | ICD-10-CM | POA: Diagnosis not present

## 2022-09-09 DIAGNOSIS — N1831 Chronic kidney disease, stage 3a: Secondary | ICD-10-CM | POA: Insufficient documentation

## 2022-09-09 DIAGNOSIS — Z789 Other specified health status: Secondary | ICD-10-CM

## 2022-09-09 DIAGNOSIS — D649 Anemia, unspecified: Secondary | ICD-10-CM

## 2022-09-09 DIAGNOSIS — K5730 Diverticulosis of large intestine without perforation or abscess without bleeding: Secondary | ICD-10-CM

## 2022-09-09 DIAGNOSIS — Z85038 Personal history of other malignant neoplasm of large intestine: Secondary | ICD-10-CM | POA: Diagnosis not present

## 2022-09-09 DIAGNOSIS — Z7984 Long term (current) use of oral hypoglycemic drugs: Secondary | ICD-10-CM | POA: Diagnosis not present

## 2022-09-09 DIAGNOSIS — N182 Chronic kidney disease, stage 2 (mild): Secondary | ICD-10-CM

## 2022-09-09 DIAGNOSIS — D638 Anemia in other chronic diseases classified elsewhere: Secondary | ICD-10-CM

## 2022-09-09 DIAGNOSIS — L239 Allergic contact dermatitis, unspecified cause: Secondary | ICD-10-CM

## 2022-09-09 DIAGNOSIS — N400 Enlarged prostate without lower urinary tract symptoms: Secondary | ICD-10-CM | POA: Diagnosis present

## 2022-09-09 DIAGNOSIS — I130 Hypertensive heart and chronic kidney disease with heart failure and stage 1 through stage 4 chronic kidney disease, or unspecified chronic kidney disease: Secondary | ICD-10-CM | POA: Diagnosis not present

## 2022-09-09 DIAGNOSIS — D84821 Immunodeficiency due to drugs: Secondary | ICD-10-CM

## 2022-09-09 DIAGNOSIS — K810 Acute cholecystitis: Secondary | ICD-10-CM | POA: Diagnosis not present

## 2022-09-09 HISTORY — DX: Malignant neoplasm of colon, unspecified: C18.9

## 2022-09-09 HISTORY — DX: Personal history of COVID-19: Z86.16

## 2022-09-09 HISTORY — DX: Personal history of urinary calculi: Z87.442

## 2022-09-09 HISTORY — DX: Other specified health status: Z78.9

## 2022-09-09 HISTORY — DX: Obesity, unspecified: E66.9

## 2022-09-09 HISTORY — DX: Personal history of other malignant neoplasm of large intestine: Z85.038

## 2022-09-09 HISTORY — DX: Nausea with vomiting, unspecified: R11.2

## 2022-09-09 LAB — COMPREHENSIVE METABOLIC PANEL
ALT: 8 U/L (ref 0–44)
AST: 20 U/L (ref 15–41)
Albumin: 3.3 g/dL — ABNORMAL LOW (ref 3.5–5.0)
Alkaline Phosphatase: 70 U/L (ref 38–126)
Anion gap: 9 (ref 5–15)
BUN: 18 mg/dL (ref 8–23)
CO2: 23 mmol/L (ref 22–32)
Calcium: 8.7 mg/dL — ABNORMAL LOW (ref 8.9–10.3)
Chloride: 102 mmol/L (ref 98–111)
Creatinine, Ser: 1.46 mg/dL — ABNORMAL HIGH (ref 0.61–1.24)
GFR, Estimated: 51 mL/min — ABNORMAL LOW (ref >60)
Glucose, Bld: 67 mg/dL — ABNORMAL LOW (ref 70–99)
Potassium: 4 mmol/L (ref 3.5–5.1)
Sodium: 134 mmol/L — ABNORMAL LOW (ref 135–145)
Total Bilirubin: 0.9 mg/dL (ref 0.3–1.2)
Total Protein: 8 g/dL (ref 6.5–8.1)

## 2022-09-09 LAB — IRON AND TIBC
Iron: 35 ug/dL — ABNORMAL LOW (ref 45–182)
Saturation Ratios: 9 % — ABNORMAL LOW (ref 17.9–39.5)
TIBC: 374 ug/dL (ref 250–450)
UIBC: 339 ug/dL

## 2022-09-09 LAB — CBC
HCT: 28.3 % — ABNORMAL LOW (ref 39.0–52.0)
Hemoglobin: 8.9 g/dL — ABNORMAL LOW (ref 13.0–17.0)
MCH: 33.2 pg (ref 26.0–34.0)
MCHC: 31.4 g/dL (ref 30.0–36.0)
MCV: 105.6 fL — ABNORMAL HIGH (ref 80.0–100.0)
Platelets: 146 10*3/uL — ABNORMAL LOW (ref 150–400)
RBC: 2.68 MIL/uL — ABNORMAL LOW (ref 4.22–5.81)
RDW: 18.6 % — ABNORMAL HIGH (ref 11.5–15.5)
WBC: 3.9 10*3/uL — ABNORMAL LOW (ref 4.0–10.5)
nRBC: 0 % (ref 0.0–0.2)

## 2022-09-09 LAB — GLUCOSE, CAPILLARY: Glucose-Capillary: 78 mg/dL (ref 70–99)

## 2022-09-09 LAB — URINALYSIS, ROUTINE W REFLEX MICROSCOPIC
Bilirubin Urine: NEGATIVE
Glucose, UA: NEGATIVE mg/dL
Hgb urine dipstick: NEGATIVE
Ketones, ur: NEGATIVE mg/dL
Leukocytes,Ua: NEGATIVE
Nitrite: NEGATIVE
Protein, ur: NEGATIVE mg/dL
Specific Gravity, Urine: 1.008 (ref 1.005–1.030)
pH: 6 (ref 5.0–8.0)

## 2022-09-09 LAB — CBG MONITORING, ED: Glucose-Capillary: 67 mg/dL — ABNORMAL LOW (ref 70–99)

## 2022-09-09 LAB — VITAMIN B12: Vitamin B-12: 213 pg/mL (ref 180–914)

## 2022-09-09 LAB — TROPONIN I (HIGH SENSITIVITY)
Troponin I (High Sensitivity): 9 ng/L (ref <18)
Troponin I (High Sensitivity): 10 ng/L (ref <18)

## 2022-09-09 LAB — PROTIME-INR
INR: 1.5 — ABNORMAL HIGH (ref 0.8–1.2)
Prothrombin Time: 17.9 seconds — ABNORMAL HIGH (ref 11.4–15.2)

## 2022-09-09 LAB — RETICULOCYTES
Immature Retic Fract: 18.7 % — ABNORMAL HIGH (ref 2.3–15.9)
RBC.: 2.81 MIL/uL — ABNORMAL LOW (ref 4.22–5.81)
Retic Count, Absolute: 57 10*3/uL (ref 19.0–186.0)
Retic Ct Pct: 2 % (ref 0.4–3.1)

## 2022-09-09 LAB — PHOSPHORUS: Phosphorus: 3.8 mg/dL (ref 2.5–4.6)

## 2022-09-09 LAB — FOLATE: Folate: 31.1 ng/mL (ref >5.9)

## 2022-09-09 LAB — SARS CORONAVIRUS 2 BY RT PCR: SARS Coronavirus 2 by RT PCR: NEGATIVE

## 2022-09-09 LAB — LIPASE, BLOOD: Lipase: 36 U/L (ref 11–51)

## 2022-09-09 LAB — BRAIN NATRIURETIC PEPTIDE: B Natriuretic Peptide: 135.1 pg/mL — ABNORMAL HIGH (ref 0.0–100.0)

## 2022-09-09 LAB — FERRITIN: Ferritin: 33 ng/mL (ref 24–336)

## 2022-09-09 MED ORDER — ONDANSETRON HCL 4 MG/2ML IJ SOLN
4.0000 mg | Freq: Once | INTRAMUSCULAR | Status: AC
Start: 2022-09-09 — End: 2022-09-09
  Administered 2022-09-09: 4 mg via INTRAVENOUS
  Filled 2022-09-09: qty 2

## 2022-09-09 MED ORDER — SODIUM CHLORIDE 0.9 % IV SOLN: 8.0000 mg | Freq: Four times a day (QID) | INTRAVENOUS | Status: DC | PRN

## 2022-09-09 MED ORDER — MAGIC MOUTHWASH: 15.0000 mL | Freq: Four times a day (QID) | ORAL | Status: DC | PRN

## 2022-09-09 MED ORDER — LIP MEDEX EX OINT
TOPICAL_OINTMENT | Freq: Two times a day (BID) | CUTANEOUS | Status: DC
  Administered 2022-09-09 – 2022-09-12 (×4): 75 via TOPICAL
  Filled 2022-09-09 (×3): qty 7

## 2022-09-09 MED ORDER — POLYETHYLENE GLYCOL 3350 17 G PO PACK
17.0000 g | PACK | Freq: Two times a day (BID) | ORAL | Status: DC
  Administered 2022-09-10 – 2022-09-13 (×5): 17 g via ORAL
  Filled 2022-09-09 (×6): qty 1

## 2022-09-09 MED ORDER — PHENOL 1.4 % MT LIQD: 2.0000 | OROMUCOSAL | Status: DC | PRN

## 2022-09-09 MED ORDER — ACETAMINOPHEN 325 MG PO TABS
650.0000 mg | ORAL_TABLET | Freq: Four times a day (QID) | ORAL | Status: DC | PRN
  Administered 2022-09-09: 650 mg via ORAL
  Filled 2022-09-09: qty 2

## 2022-09-09 MED ORDER — PROCHLORPERAZINE EDISYLATE 10 MG/2ML IJ SOLN: 5.0000 mg | INTRAMUSCULAR | Status: DC | PRN

## 2022-09-09 MED ORDER — ONDANSETRON HCL 4 MG PO TABS: 4.0000 mg | ORAL_TABLET | Freq: Four times a day (QID) | ORAL | Status: DC | PRN

## 2022-09-09 MED ORDER — ALUM & MAG HYDROXIDE-SIMETH 200-200-20 MG/5ML PO SUSP: 30.0000 mL | Freq: Four times a day (QID) | ORAL | Status: DC | PRN

## 2022-09-09 MED ORDER — MORPHINE SULFATE (PF) 2 MG/ML IV SOLN
2.0000 mg | INTRAVENOUS | Status: DC | PRN
  Administered 2022-09-11 (×2): 2 mg via INTRAVENOUS
  Filled 2022-09-09 (×2): qty 1

## 2022-09-09 MED ORDER — ACETAMINOPHEN 650 MG RE SUPP: 650.0000 mg | Freq: Four times a day (QID) | RECTAL | Status: DC | PRN

## 2022-09-09 MED ORDER — SODIUM CHLORIDE (PF) 0.9 % IJ SOLN
INTRAMUSCULAR | Status: AC
  Filled 2022-09-09: qty 50

## 2022-09-09 MED ORDER — MENTHOL 3 MG MT LOZG: 1.0000 | LOZENGE | OROMUCOSAL | Status: DC | PRN

## 2022-09-09 MED ORDER — OXYCODONE HCL 5 MG PO TABS
5.0000 mg | ORAL_TABLET | ORAL | Status: DC | PRN
  Administered 2022-09-10 – 2022-09-13 (×2): 5 mg via ORAL
  Filled 2022-09-09 (×2): qty 1

## 2022-09-09 MED ORDER — ONDANSETRON HCL 4 MG/2ML IJ SOLN: 4.0000 mg | Freq: Four times a day (QID) | INTRAMUSCULAR | Status: DC | PRN

## 2022-09-09 MED ORDER — LACTATED RINGERS IV SOLN: INTRAVENOUS | Status: AC

## 2022-09-09 MED ORDER — INSULIN ASPART 100 UNIT/ML IJ SOLN
0.0000 [IU] | INTRAMUSCULAR | Status: DC
  Administered 2022-09-10: 5 [IU] via SUBCUTANEOUS
  Administered 2022-09-11: 7 [IU] via SUBCUTANEOUS
  Administered 2022-09-11: 2 [IU] via SUBCUTANEOUS
  Administered 2022-09-12: 5 [IU] via SUBCUTANEOUS
  Administered 2022-09-12 (×5): 2 [IU] via SUBCUTANEOUS
  Administered 2022-09-13: 1 [IU] via SUBCUTANEOUS
  Administered 2022-09-13: 2 [IU] via SUBCUTANEOUS
  Administered 2022-09-13: 1 [IU] via SUBCUTANEOUS

## 2022-09-09 MED ORDER — POLYETHYLENE GLYCOL 3350 17 G PO PACK
51.0000 g | PACK | Freq: Once | ORAL | Status: DC
  Filled 2022-09-09: qty 3

## 2022-09-09 MED ORDER — PIPERACILLIN-TAZOBACTAM 3.375 G IVPB
3.3750 g | Freq: Three times a day (TID) | INTRAVENOUS | Status: DC
  Administered 2022-09-10 – 2022-09-12 (×8): 3.375 g via INTRAVENOUS
  Filled 2022-09-09 (×8): qty 50

## 2022-09-09 MED ORDER — MELATONIN 5 MG PO TABS
10.0000 mg | ORAL_TABLET | Freq: Every evening | ORAL | Status: DC | PRN
  Administered 2022-09-11: 10 mg via ORAL
  Filled 2022-09-09: qty 2

## 2022-09-09 MED ORDER — MORPHINE SULFATE (PF) 4 MG/ML IV SOLN
4.0000 mg | Freq: Once | INTRAVENOUS | Status: AC
Start: 2022-09-09 — End: 2022-09-09
  Administered 2022-09-09: 4 mg via INTRAVENOUS
  Filled 2022-09-09: qty 1

## 2022-09-09 MED ORDER — PIPERACILLIN-TAZOBACTAM 3.375 G IVPB 30 MIN
3.3750 g | Freq: Once | INTRAVENOUS | Status: AC
  Administered 2022-09-09: 3.375 g via INTRAVENOUS
  Filled 2022-09-09: qty 50

## 2022-09-09 MED ORDER — PANTOPRAZOLE SODIUM 40 MG PO TBEC
40.0000 mg | DELAYED_RELEASE_TABLET | Freq: Every day | ORAL | Status: DC
  Administered 2022-09-12 – 2022-09-13 (×2): 40 mg via ORAL
  Filled 2022-09-09 (×4): qty 1

## 2022-09-09 MED ORDER — IOHEXOL 350 MG/ML SOLN
100.0000 mL | Freq: Once | INTRAVENOUS | Status: AC | PRN
  Administered 2022-09-09: 100 mL via INTRAVENOUS

## 2022-09-09 MED ORDER — ONDANSETRON HCL 4 MG/2ML IJ SOLN
4.0000 mg | Freq: Four times a day (QID) | INTRAMUSCULAR | Status: DC | PRN
  Filled 2022-09-09: qty 2

## 2022-09-09 MED ORDER — TAMSULOSIN HCL 0.4 MG PO CAPS
0.4000 mg | ORAL_CAPSULE | Freq: Every day | ORAL | Status: DC
  Administered 2022-09-12 – 2022-09-13 (×2): 0.4 mg via ORAL
  Filled 2022-09-09 (×4): qty 1

## 2022-09-09 NOTE — Assessment & Plan Note (Signed)
Stable. Needs MAR verified by pharmacy tech.

## 2022-09-09 NOTE — Progress Notes (Signed)
Pharmacy Antibiotic Note  [name] is a 71 y.o. male admitted on 09/09/2022 with  intra-abdominal infection .  Pharmacy has been consulted for piperacillin/tazobactam dosing.  Today, 09/09/22 WBC low SCr 1.46 CrCl ~ 65 mL/min using last recorded TBW of 110 kg from June 2023. Ht/wt order entered.  Plan: Piperacillin/tazobactam 3.375 g IV q8h     Temp (24hrs), Avg:98.2 F (36.8 C), Min:98.1 F (36.7 C), Max:98.3 F (36.8 C)  Recent Labs  Lab 09/09/22 1042  WBC 3.9*  CREATININE 1.46*    CrCl cannot be calculated (Unknown ideal weight.).    Allergies  Allergen Reactions   Other     [name] bandage (self-gripping) - erythema, itching    Antimicrobials this admission: Piperacillin/tazobactam 9/28 >>   Dose adjustments this admission:  Microbiology results:  [name], PharmD 09/09/2022 8:08 PM

## 2022-09-09 NOTE — Subjective & Objective (Signed)
CC: abd pain x 4 days HPI: 71 year old Caucasian male history of A-fib on chronic anticoagulation, CKD stage II, chronic constipation, obesity, bilateral lower extremity lymphedema, chronic venous stasis who presents to the ER with a 4-day history of abdominal pain.  He had some nausea with some dry heaving about 7 days ago.  He has not had a bowel movement for 4 days up until this morning.  He states he is only had 1 small bowel movement today.  He has a history of chronic constipation.  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  He has been living in a assisted living facility for the last 7 years.  Brought to the ER today due to abdominal pain.  On arrival temp 90.3 heart rate 73, blood pressure 138/58 satting 100% on room air.  Labs showed a white count 3.9, hemoglobin 8.9, platelets of 146  Sodium 134, Tessman 4.0, BUN of 18, glucose 67, creatinine 1.46  UA negative.  Chest x-ray showed low lung volumes.  CT scan abdomen pelvis demonstrated cholelithiasis with some inflammatory stranding around the gallbladder concerning for acute cholecystitis.  CTPA was negative for PE.  There is no pleural effusion.  Right upper quadrant abdominal ultrasound showed cholelithiasis with gallbladder sludge.  Gallbladder wall borderline thickened at 3 mm.  EDP discussed case with Dr. Johney Maine with general surgery.  He requested medical admission.  IV antibiotics started with Zosyn.  [facility] hospitalist contacted for admission.

## 2022-09-09 NOTE — Assessment & Plan Note (Signed)
Chronic. 

## 2022-09-09 NOTE — Progress Notes (Signed)
Note: Portions of this report may have been transcribed using voice recognition software. Every effort was made to ensure accuracy; however, inadvertent computerized transcription errors may be present.   Any transcriptional errors that result from this process are unintentional.              [name]  1951-07-13 [id number]  Patient Care Team: Allen Mariscal, MD as PCP - General (Internal Medicine) Allen Pickett Nadean Corwin, MD as PCP - Cardiology (Cardiology) Allen Artist, MD as Consulting Physician (Gastroenterology) Allen Confer, MD (General Surgery)  This patient is a 71 y.o.male who presents today for surgical evaluation at the request of Dr. Darl Reese .   Reason for visit: Recurrent abdominal pain with gallstones.   Pateint has had intermittent episodes of abdominal and chest pain.  Last episode was more of a cardiac issue with bradycardia.  He mentions being diagnosed with a bowel obstruction but I see no evidence or records of this.  No evidence on CAT scan today.  He has had issues of homelessness in the past with anxiety and depression and intermittent years of negligence, not seeing doctors.  Seems like he has been much more compliant and being followed by medicine and cardiology more regularly.  Has been living in assisted facility for many years now n   Patient with multiple medical issues including atrial fibrillation with history of bifascicular block and bradycardia.  Aortic root dilatation.  Chronically anticoagulated on Eliquis.  Diabetes and oral hypoglycemias.  Obesity.  Chronic immunosuppression for methotrexate for sounds like a combination of allergic and NSAIDs in my dermatitis with bilateral lower extremity venous stasis ulcers.  Diuretic dependent.  Hypertension.  Hyperlipidemia.  Chronic constipation with history of prior fecal impaction and stercoral ulcers.  Has not had a bowel movement in half a week.  Had episode of significant anemia and found to have cecal mass in  2017 by [name] with [facility] gastroenterology.  Patient underwent colectomy by [name] with our group in 2017 and found to have stage I T1N0 colon cancer.  I do not know if he has had GI follow-up since his colon cancer resection in 2017.  Was followed by [facility] gastroenterology and there is no records of any visit since that colonoscopy.  They recommended a 1-3-year follow-up.    He has calcified gallstones.  He has steatosis.  His gallbladder wall looks borderline.  There is no definite cholecystitis by CT scan or ultrasound but not normal.  Given the patient's atypical history not classic for biliary colic and other episodes of pain of nonbilious etiology, I think it makes sense to consider a HIDA scan.  More objectively see if the gallbladder truly is obstructed.  If no evidence of cystic duct obstruction/acute cholecystitis; see if they can do a gallbladder ejection fraction to see if there is some biliary dyskinesia/colic component.  I do not think he can undergo emergent surgery tonight so reasonable to hold until he is off narcotics n.p.o. for 6 hours to have a more accurate study per nuclear medicine protocol.    If there is strong evidence of a biliary etiology, patient may need more aggressive intervention.  Would need medical and cardiac clearance to assess risks.  He has a lot of issues going on but echocardiogram did not seem that bad.  However his performance status is poor.  We will need to hold anticoagulation anyway.  Patient remains very anemic.  I do not know if the presumption is chronic bleeding  from his venous stasis ulcers on bilateral lower extremities but I put in orders for an anemia panel to see if this is iron deficiency or is it still macrocytic as it been mentioned before.  May need to consider upper and lower endoscopy to rule out digestive tract source such as polyps/cancer/ulcers.  Do not know if that is the pressing issue right now but if HIDA scan negative, would  consider gastrology consultation More formal consult to follow.  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    Priority: Medium    CKD (chronic kidney disease) stage 2, GFR 60-89 ml/min 09/09/2022   Chronic anticoagulation 09/09/2022   Personal history of cecal colon cancer 09/09/2022   Anemia 09/09/2022   Obesity (BMI 30-39.9) 09/09/2022   Acute cholecystitis 09/09/2022   Subacute eczematous & allergic contact dermatitis  09/09/2022   History of COVID-19 09/09/2022   History of homelessness 09/09/2022   Diverticulosis of colon 09/09/2022   Cholelithiasis 09/09/2022   Hepatic steatosis 09/09/2022   Living in assisted living 09/09/2022   Pain in right foot 05/06/2022   Atrial fibrillation with slow ventricular response ([place]) 12/23/2021   Pain of joint of left ankle and foot 02/26/2021   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   Abnormal CT scan, colon    AKI (acute kidney injury) ([place])    Generalized weakness    Abdominal pain    Protein-calorie malnutrition, severe 11/10/2016   Constipation, chronic 11/09/2016   CKD (chronic kidney disease) stage 3, GFR 30-59 ml/min (HCC) 11/09/2016   Hyperbilirubinemia 11/09/2016   Hyponatremia 11/08/2016   RBBB 09/13/2013   Snoring 09/13/2013   Edema 09/07/2013   Diabetes mellitus type 2 in obese ([place]) 03/16/2013   Arthritis 03/16/2013   Essential hypertension, benign 02/23/2013   Morbid obesity ([place]) 02/23/2013   Atrial fibrillation ([place]) 02/23/2013   Hyperlipidemia 02/23/2013    Past Medical History:  Diagnosis Date   Adenocarcinoma of colon ([place])    Atrial fibrillation ([place])    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   Cataract    Constipation, chronic 11/09/2016   Diabetes mellitus without complication ([place])    History of COVID-19 09/09/2022   History  of homelessness 09/09/2022   History of kidney stones 09/09/2022   History of Stercoral ulcer of rectum 11/28/2016   Hyperlipidemia    Hypertension    Lichen planus    bilateral legs   Nausea & vomiting 09/09/2022   Obesity (BMI 30-39.9) 09/09/2022   Personal history of cecal colon cancer 09/09/2022    Past Surgical History:  Procedure Laterality Date   APPENDECTOMY     COLON RESECTION N/A 11/22/2016   Procedure: HAND ASSISTED LAPAROSCOPIC COLON RESECTION;  Surgeon: Allen Confer, MD;  Location: [facility];  Service: General;  Laterality: N/A;   COLONOSCOPY N/A 11/17/2016   Procedure: COLONOSCOPY;  Surgeon: Allen Artist, MD;  Location: [facility];  Service: Endoscopy;  Laterality: N/A;   FRACTURE SURGERY     MULTIPLE TOOTH EXTRACTIONS     RECTAL EXAM UNDER ANESTHESIA N/A 12/05/2016   Procedure: RECTAL EXAM UNDER ANESTHESIA, DISIMPACTION;  Surgeon: Alphonsa Overall, MD;  Location: [facility];  Service: General;  Laterality: N/A;    Social History   Socioeconomic History   Marital status: Single    Spouse name: Not on file   Number of  children: Not on file   Years of education: Not on file   Highest education level: Not on file  Occupational History   Occupation: unemployed  Tobacco Use   Smoking status: Former    Types: Pipe    Quit date: 09/14/1983    Years since quitting: 39.0   Smokeless tobacco: Never  Substance and Sexual Activity   Alcohol use: No   Drug use: No   Sexual activity: Never    Birth control/protection: Condom  Other Topics Concern   Not on file  Social History Narrative   Not on file   Social Determinants of Health   Financial Resource Strain: Not on file  Food Insecurity: Not on file  Transportation Needs: Not on file  Physical Activity: Not on file  Stress: Not on file  Social Connections: Not on file  Intimate Partner Violence: Not on file    Family History  Problem Relation Age of Onset   Brain cancer Mother    Alzheimer's disease Father     Cancer Maternal Grandmother    Heart attack Maternal Grandfather    Pneumonia Paternal Grandfather     Current Facility-Administered Medications  Medication Dose Route Frequency Provider Last Rate Last Admin   alum & mag hydroxide-simeth (MAALOX/MYLANTA) 200-200-20 MG/5ML suspension 30 mL  30 mL Oral Q6H PRN Michael Boston, MD       lactated ringers infusion   Intravenous Continuous Kristopher Oppenheim, DO       lip balm (CARMEX) ointment   Topical BID Michael Boston, MD       magic mouthwash  15 mL Oral QID PRN Michael Boston, MD       menthol-cetylpyridinium (CEPACOL) lozenge 3 mg  1 lozenge Oral PRN Michael Boston, MD       ondansetron (ZOFRAN) injection 4 mg  4 mg Intravenous Q6H PRN Michael Boston, MD       Or   ondansetron (ZOFRAN) 8 mg in sodium chloride 0.9 % 50 mL IVPB  8 mg Intravenous Q6H PRN Lilliahna Schubring, Remo Lipps, MD       phenol (CHLORASEPTIC) mouth spray 2 spray  2 spray Mouth/Throat PRN Michael Boston, MD       polyethylene glycol (MIRALAX / GLYCOLAX) packet 17 g  17 g Oral BID Michael Boston, MD       polyethylene glycol (MIRALAX / GLYCOLAX) packet 51 g  51 g Oral Once Michael Boston, MD       prochlorperazine (COMPAZINE) injection 5-10 mg  5-10 mg Intravenous Q4H PRN Michael Boston, MD       sodium chloride (PF) 0.9 % injection            Current Outpatient Medications  Medication Sig Dispense Refill   apixaban (ELIQUIS) 5 MG TABS tablet Take 1 tablet (5 mg total) by mouth 2 (two) times daily. 60 tablet 5   atorvastatin (LIPITOR) 20 MG tablet Take 20 mg by mouth daily.     carbamide peroxide (DEBROX) 6.5 % OTIC solution Place 5 drops into the right ear 2 (two) times daily. 15 mL 0   dextrose (GLUTOSE) 40 % GEL Take 1 Tube by mouth as needed for low blood sugar.     docusate sodium (COLACE) 100 MG capsule Take 1 capsule (100 mg total) by mouth 2 (two) times daily.     folic acid (FOLVITE) 1 MG tablet Take 1 mg by mouth See admin instructions. '1mg'$  everyday EXCEPT none on Fridays.     furosemide  (LASIX) 20 MG  tablet Take 1 tablet (20 mg total) by mouth as needed for edema or fluid (If taking more than twice per week, please call cardiology office.). 30 tablet 1   glipiZIDE (GLUCOTROL) 10 MG tablet Take 10 mg by mouth 2 (two) times daily before a meal.     hydrOXYzine (ATARAX/VISTARIL) 25 MG tablet Take 1 tablet (25 mg total) by mouth every 8 (eight) hours as needed for itching. Use caution as this medication may make you drowsy 10 tablet 0   JANUVIA 100 MG tablet Take 100 mg by mouth daily.     metFORMIN (GLUCOPHAGE) 1000 MG tablet Take 1,000 mg by mouth 2 (two) times daily with a meal.     methotrexate (RHEUMATREX) 2.5 MG tablet Take 10 mg by mouth once a week. Takes on Fridays.     miconazole (MICOTIN) 2 % powder Apply 1 application topically See admin instructions. 1 application topical twice daily And 1 application daily to groin, stomach, thighs, buttocks area for chafing.     pantoprazole (PROTONIX) 40 MG tablet Take 1 tablet (40 mg total) by mouth daily. 30 tablet 0   polyethylene glycol (MIRALAX) packet Take 17 g by mouth daily. Hold for diarrhea     PRESCRIPTION MEDICATION Apply 1 application topically daily. Tac 0.1% Cetaphil 1:4 Apply to lower legs     tamsulosin (FLOMAX) 0.4 MG CAPS capsule Take 1 capsule (0.4 mg total) by mouth daily.     triamcinolone (KENALOG) 0.1 % Apply 1 application topically See admin instructions. 1 application topical twice daily to itchy areas And 1 extra application daily as needed for dermatitis       Allergies  Allergen Reactions   Other     [name] bandage (self-gripping) - erythema, itching    BP 133/71   Pulse 77   Temp 98.1 F (36.7 C) (Oral)   Resp 12   SpO2 100%   US Abdomen Limited RUQ (LIVER/GB)  Result Date: 09/09/2022 CLINICAL DATA:  Right upper quadrant pain EXAM: ULTRASOUND ABDOMEN LIMITED RIGHT UPPER QUADRANT COMPARISON:  CT abdomen and pelvis same day FINDINGS: Gallbladder: Multiple gallstones are present  measuring up to 1.8 cm. Gallbladder sludge is present. Gallbladder wall is borderline thickened measuring 3 mm. Common bile duct: Diameter: Not visualized Liver: No focal lesion identified. Increase in parenchymal echogenicity. Portal vein is patent on color Doppler imaging with normal direction of blood flow towards the liver. Other: None. IMPRESSION: 1. Cholelithiasis and gallbladder sludge. Gallbladder wall is borderline thickened measuring 3 mm. Findings are equivocal for acute cholecystitis. 2. Hepatic steatosis. Electronically Signed   By: Ronney Asters M.D.   On: 09/09/2022 18:41   CT ABDOMEN PELVIS W CONTRAST  Result Date: 09/09/2022 CLINICAL DATA:  Acute epigastric abdominal pain. EXAM: CT ABDOMEN AND PELVIS WITH CONTRAST TECHNIQUE: Multidetector CT imaging of the abdomen and pelvis was performed using the standard protocol following bolus administration of intravenous contrast. RADIATION DOSE REDUCTION: This exam was performed according to the departmental dose-optimization program which includes automated exposure control, adjustment of the mA and/or kV according to patient size and/or use of iterative reconstruction technique. CONTRAST:  179m OMNIPAQUE IOHEXOL 350 MG/ML SOLN COMPARISON:  December 22, 2021. FINDINGS: Lower chest: Minimal bilateral pleural effusions are noted with minimal adjacent subsegmental atelectasis. Hepatobiliary: No biliary dilatation is noted. Cholelithiasis is noted with 1.8 cm stone noted in neck of gallbladder. There are now noted mild inflammatory changes around the gallbladder concerning for possible cholecystitis. Liver is unremarkable. Pancreas: Unremarkable. No  pancreatic ductal dilatation or surrounding inflammatory changes. Spleen: Normal in size without focal abnormality. Adrenals/Urinary Tract: Adrenal glands appear normal. Parapelvic cysts are seen on the left. No hydronephrosis or renal obstruction is noted. Urinary bladder is unremarkable. Stomach/Bowel: Stomach  appears normal. There is no evidence of bowel obstruction or inflammation. Postsurgical changes are seen involving the right colon. Sigmoid diverticulosis is noted without inflammation. Vascular/Lymphatic: Aortic atherosclerosis. No enlarged abdominal or pelvic lymph nodes. Reproductive: Prostate is unremarkable. Other: Small left inguinal hernia is noted which contains a portion of sigmoid colon, but does not result in obstruction. Musculoskeletal: No acute or significant osseous findings. IMPRESSION: Cholelithiasis is noted with mild inflammatory stranding surrounding gallbladder concerning for acute cholecystitis. Ultrasound is recommended for further evaluation. Minimal bilateral pleural effusions are noted. Sigmoid diverticulosis without inflammation. Small left inguinal hernia is noted which contains a portion of sigmoid colon, but does not result in obstruction. Aortic Atherosclerosis (ICD10-I70.0). Electronically Signed   By: Marijo Conception M.D.   On: 09/09/2022 16:39   CT Angio Chest PE W and/or Wo Contrast  Result Date: 09/09/2022 CLINICAL DATA:  Epigastric pain and shortness of breath. EXAM: CT ANGIOGRAPHY CHEST WITH CONTRAST TECHNIQUE: Multidetector CT imaging of the chest was performed using the standard protocol during bolus administration of intravenous contrast. Multiplanar CT image reconstructions and MIPs were obtained to evaluate the vascular anatomy. RADIATION DOSE REDUCTION: This exam was performed according to the departmental dose-optimization program which includes automated exposure control, adjustment of the mA and/or kV according to patient size and/or use of iterative reconstruction technique. CONTRAST:  123m OMNIPAQUE IOHEXOL 350 MG/ML SOLN COMPARISON:  None Available. FINDINGS: Cardiovascular: The pulmonary arteries are adequately opacified. There is no evidence of pulmonary embolism. Central pulmonary arteries are of normal caliber. The thoracic aorta is normal in caliber. No  significant aortic atherosclerosis. The heart size is normal. No pericardial fluid identified. No visualized calcified coronary artery plaque. Mediastinum/Nodes: There are some scattered small and mildly enlarged lymph nodes throughout the mediastinum. The largest in the right lower paratracheal region measures approximately 12 mm in short axis. These are nonspecific. No enlarged hilar or axillary lymph nodes identified. Thyroid gland, trachea, and esophagus demonstrate no significant findings. Lungs/Pleura: There is no evidence of pulmonary edema, consolidation, pneumothorax, nodule or pleural fluid. Upper Abdomen: Decreased attenuation of the liver parenchyma is consistent with steatosis. Musculoskeletal: No chest wall abnormality. No acute or significant osseous findings. Review of the MIP images confirms the above findings. IMPRESSION: 1. No evidence of pulmonary embolism or other acute findings in the chest. 2. Scattered small and mildly enlarged lymph nodes throughout the mediastinum which are nonspecific. 3. Hepatic steatosis. Electronically Signed   By: GAletta EdouardM.D.   On: 09/09/2022 16:37   DG Chest 2 View  Result Date: 09/09/2022 CLINICAL DATA:  Shortness of breath. EXAM: CHEST - 2 VIEW COMPARISON:  02/18/2021 FINDINGS: Stable cardiomediastinal contours. Lung volumes are low. Blunting of the costophrenic angles may reflect small pleural effusions. No significant interstitial edema or airspace disease. Visualized osseous structures are unremarkable. IMPRESSION: 1. Low lung volumes. 2. Suspect small bilateral pleural effusions. Electronically Signed   By: TKerby MoorsM.D.   On: 09/09/2022 11:10

## 2022-09-09 NOTE — Assessment & Plan Note (Signed)
Stable

## 2022-09-09 NOTE — Assessment & Plan Note (Signed)
S/p right colectomy in 2017

## 2022-09-09 NOTE — ED Notes (Signed)
Patient has a blue top in the main lab °

## 2022-09-09 NOTE — ED Provider Notes (Signed)
[place] DEPT Provider Note   CSN: [id number] Arrival date & time: 09/09/22  1004     History  Chief Complaint  Patient presents with   Abdominal Pain   Shortness of Breath    Allen MAHLER is a 71 y.o. male hx of DM, afib on eliquis, previous SBO here presenting with epigastric pain and abdominal distention and shortness of breath.  Patient has been having epigastric pain and felt nauseated.  Patient states that he has a history of small bowel obstruction and this felt very similar.  He also has some shortness of breath as well.  Patient denies any COVID exposure.  Denies any history of heart failure.  Patient states that he is not very mobile but no history of blood clots and he is on Eliquis.   The history is provided by the patient.       Home Medications Prior to Admission medications   Medication Sig Start Date End Date Taking? Authorizing Provider  apixaban (ELIQUIS) 5 MG TABS tablet Take 1 tablet (5 mg total) by mouth 2 (two) times daily. 02/02/22   Loel Dubonnet, NP  atorvastatin (LIPITOR) 20 MG tablet Take 20 mg by mouth daily. 02/02/21   [provider]  carbamide peroxide (DEBROX) 6.5 % OTIC solution Place 5 drops into the right ear 2 (two) times daily. 12/25/21   Swayze, Ava, DO  dextrose (GLUTOSE) 40 % GEL Take 1 Tube by mouth as needed for low blood sugar.    [provider]  docusate sodium (COLACE) 100 MG capsule Take 1 capsule (100 mg total) by mouth 2 (two) times daily. 12/08/16   Barton Dubois, MD  folic acid (FOLVITE) 1 MG tablet Take 1 mg by mouth See admin instructions. '1mg'$  everyday EXCEPT none on Fridays. 01/29/21   [provider]  furosemide (LASIX) 20 MG tablet Take 1 tablet (20 mg total) by mouth as needed for edema or fluid (If taking more than twice per week, please call cardiology office.). 02/02/22   Loel Dubonnet, NP  glipiZIDE (GLUCOTROL) 10 MG tablet Take 10 mg by mouth 2 (two) times  daily before a meal. 01/23/21   [provider]  hydrOXYzine (ATARAX/VISTARIL) 25 MG tablet Take 1 tablet (25 mg total) by mouth every 8 (eight) hours as needed for itching. Use caution as this medication may make you drowsy 07/17/20   Little, Wenda Overland, MD  JANUVIA 100 MG tablet Take 100 mg by mouth daily. 01/20/21   [provider]  metFORMIN (GLUCOPHAGE) 1000 MG tablet Take 1,000 mg by mouth 2 (two) times daily with a meal. 01/23/21   [provider]  methotrexate (RHEUMATREX) 2.5 MG tablet Take 10 mg by mouth once a week. Takes on Fridays. 01/29/21   [provider]  miconazole (MICOTIN) 2 % powder Apply 1 application topically See admin instructions. 1 application topical twice daily And 1 application daily to groin, stomach, thighs, buttocks area for chafing.    [provider]  pantoprazole (PROTONIX) 40 MG tablet Take 1 tablet (40 mg total) by mouth daily. 12/26/21   Swayze, Ava, DO  polyethylene glycol (MIRALAX) packet Take 17 g by mouth daily. Hold for diarrhea 12/08/16   Barton Dubois, MD  PRESCRIPTION MEDICATION Apply 1 application topically daily. Tac 0.1% Cetaphil 1:4 Apply to lower legs    [provider]  tamsulosin (FLOMAX) 0.4 MG CAPS capsule Take 1 capsule (0.4 mg total) by mouth daily. 11/27/16   Dyann Kief,  Clifton James, MD  triamcinolone (KENALOG) 0.1 % Apply 1 application topically See admin instructions. 1 application topical twice daily to itchy areas And 1 extra application daily as needed for dermatitis    [provider]      Allergies    Other    Review of Systems   Review of Systems  Respiratory:  Positive for shortness of breath.   Gastrointestinal:  Positive for abdominal pain and nausea.  All other systems reviewed and are negative.   Physical Exam Updated Vital Signs BP (!) 138/58 (BP Location: Left Arm)   Pulse 73   Temp 98.3 F (36.8 C) (Oral)   Resp 18   SpO2 100%  Physical Exam Vitals and  nursing note reviewed.  HENT:     Head: Normocephalic.     Mouth/Throat:     Mouth: Mucous membranes are moist.  Eyes:     Extraocular Movements: Extraocular movements intact.     Pupils: Pupils are equal, round, and reactive to light.  Cardiovascular:     Rate and Rhythm: Normal rate and regular rhythm.     Heart sounds: Normal heart sounds.  Pulmonary:     Effort: Pulmonary effort is normal.     Breath sounds: Normal breath sounds.  Abdominal:     Comments: Distended, mild diffuse tenderness   Skin:    General: Skin is warm.     Capillary Refill: Capillary refill takes less than 2 seconds.  Neurological:     General: No focal deficit present.     Mental Status: He is alert and oriented to person, place, and time.  Psychiatric:        Mood and Affect: Mood normal.        Behavior: Behavior normal.     ED Results / Procedures / Treatments   Labs (all labs ordered are listed, but only abnormal results are displayed) Labs Reviewed  COMPREHENSIVE METABOLIC PANEL - Abnormal; Notable for the following components:      Result Value   Sodium 134 (*)    Glucose, Bld 67 (*)    Creatinine, Ser 1.46 (*)    Calcium 8.7 (*)    Albumin 3.3 (*)    GFR, Estimated 51 (*)    All other components within normal limits  CBC - Abnormal; Notable for the following components:   WBC 3.9 (*)    RBC 2.68 (*)    Hemoglobin 8.9 (*)    HCT 28.3 (*)    MCV 105.6 (*)    RDW 18.6 (*)    Platelets 146 (*)    All other components within normal limits  SARS CORONAVIRUS 2 BY RT PCR  LIPASE, BLOOD  URINALYSIS, ROUTINE W REFLEX MICROSCOPIC  BRAIN NATRIURETIC PEPTIDE  TROPONIN I (HIGH SENSITIVITY)  TROPONIN I (HIGH SENSITIVITY)    EKG EKG Interpretation  Date/Time:  Thursday September 09 2022 10:39:17 EDT Ventricular Rate:  68 PR Interval:    QRS Duration: 166 QT Interval:  444 QTC Calculation: 473 R Axis:   267 Text Interpretation: Atrial fibrillation Nonspecific IVCD with LAD Consider  anterior infarct No significant change since last tracing Confirmed by Deno Etienne [phone]) on 09/09/2022 10:45:16 AM  Radiology DG Chest 2 View  Result Date: 09/09/2022 CLINICAL DATA:  Shortness of breath. EXAM: CHEST - 2 VIEW COMPARISON:  02/18/2021 FINDINGS: Stable cardiomediastinal contours. Lung volumes are low. Blunting of the costophrenic angles may reflect small pleural effusions. No significant interstitial edema or airspace disease. Visualized osseous structures are  unremarkable. IMPRESSION: 1. Low lung volumes. 2. Suspect small bilateral pleural effusions. Electronically Signed   By: Kerby Moors M.D.   On: 09/09/2022 11:10    Procedures Procedures    Medications Ordered in ED Medications  ondansetron (ZOFRAN) injection 4 mg (has no administration in time range)  morphine (PF) 4 MG/ML injection 4 mg (has no administration in time range)    ED Course/ Medical Decision Making/ A&P                           Medical Decision Making LAQUINCY EASTRIDGE is a 71 y.o. male here presenting with abdominal pain and distention.  Concern for possible SBO.  Patient is also short of breath consider PE given that he is on Eliquis.  Patient is relatively immobile and is from a nursing facility.  Plan to get CBC and CMP and CTA chest and CT abdomen pelvis.  6:58 PM Patient CT showed possible acute cholecystitis.  Ultrasound showed stones in the gallbladder and borderline thickening of the gallbladder wall.  LFTs are normal and patient is afebrile.  Patient is a poor surgical candidate and is on Eliquis.  I discussed case with Dr. Johney Maine from general surgery.  He states that surgery will consult and recommend IV antibiotics and medicine admission.  Problems Addressed: Calculus of gallbladder with acute cholecystitis without obstruction: acute illness or injury  Amount and/or Complexity of Data Reviewed Labs: ordered. Decision-making details documented in ED Course. Radiology: ordered and independent  interpretation performed. Decision-making details documented in ED Course. ECG/medicine tests: ordered and independent interpretation performed. Decision-making details documented in ED Course.  Risk Prescription drug management.    Final Clinical Impression(s) / ED Diagnoses Final diagnoses:  None    Rx / DC Orders ED Discharge Orders     None         Drenda Freeze, MD 09/09/22 1859

## 2022-09-09 NOTE — Assessment & Plan Note (Signed)
Chronic. Pt does not have cellulitis.

## 2022-09-09 NOTE — ED Triage Notes (Signed)
Pt here with reports of epigastric pain X4 days along with difficulty breathing. Pt states he is not ale to take a deep breath. Endorses fatigue as well. Pt endorses hx of SBO. Endorses small amount of loose stool today. Endorses nausea, no emesis.

## 2022-09-09 NOTE — Assessment & Plan Note (Signed)
Observation med bed. IV zosyn. Prn oxycodone. Keep NPO. IVF. Holding Eliquis in case he needs surgery or cholecystostomy tube.

## 2022-09-09 NOTE — Assessment & Plan Note (Signed)
Stable. Continue lipitor 20 mg daily. 

## 2022-09-09 NOTE — Assessment & Plan Note (Signed)
Rate controlled afib. Holding Eliquis.

## 2022-09-09 NOTE — Assessment & Plan Note (Signed)
Mild AKI. Continue with IVF. Avoid nephrotoxic agents, NSAIDs.

## 2022-09-09 NOTE — Assessment & Plan Note (Signed)
Acutely worse. Baseline Scr 1-1.2. continue with IVF. Avoid nephrotoxic agents.

## 2022-09-09 NOTE — Assessment & Plan Note (Signed)
Stable. Continue flomax 0.4 mg. 

## 2022-09-09 NOTE — Assessment & Plan Note (Signed)
Add SSI. Hold oral DM meds.

## 2022-09-09 NOTE — Assessment & Plan Note (Signed)
On Eliquis as outpatient. Holding for acute cholecystitis.

## 2022-09-09 NOTE — H&P (Signed)
History and Physical    [name] HYI:[id number] DOB: December 13, 1951 DOA: 09/09/2022  DOS: the patient was seen and examined on 09/09/2022  PCP: Sandi Mariscal, MD   Patient coming from: [place]  I have personally briefly reviewed patient's old medical records in [place]  CC: abd pain x 4 days HPI: 71 year old Caucasian male history of A-fib on chronic anticoagulation, CKD stage II, chronic constipation, obesity, bilateral lower extremity lymphedema, chronic venous stasis who presents to the ER with a 4-day history of abdominal pain.  He had some nausea with some dry heaving about 7 days ago.  He has not had a bowel movement for 4 days up until this morning.  He states he is only had 1 small bowel movement today.  He has a history of chronic constipation.  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  He has been living in a assisted living facility for the last 7 years.  Brought to the ER today due to abdominal pain.  On arrival temp 90.3 heart rate 73, blood pressure 138/58 satting 100% on room air.  Labs showed a white count 3.9, hemoglobin 8.9, platelets of 146  Sodium 134, Tessman 4.0, BUN of 18, glucose 67, creatinine 1.46  UA negative.  Chest x-ray showed low lung volumes.  CT scan abdomen pelvis demonstrated cholelithiasis with some inflammatory stranding around the gallbladder concerning for acute cholecystitis.  CTPA was negative for PE.  There is no pleural effusion.  Right upper quadrant abdominal ultrasound showed cholelithiasis with gallbladder sludge.  Gallbladder wall borderline thickened at 3 mm.  EDP discussed case with Dr. Johney Maine with general surgery.  He requested medical admission.  IV antibiotics started with Zosyn.  [facility] contacted for admission.   ED Course: RUQ u/s shows cholelithiasis and gallbladder wall thickening. CTPA negative for PE.  Review of Systems:  Review of Systems   Constitutional:  Negative for chills and fever.  HENT: Negative.    Eyes: Negative.   Respiratory:  Positive for shortness of breath.   Cardiovascular: Negative.   Gastrointestinal:  Positive for abdominal pain, constipation and nausea. Negative for blood in stool, melena and vomiting.  Genitourinary: Negative.   Musculoskeletal: Negative.   Skin:        Chronic lymphedema of bilateral lower legs  Neurological: Negative.   Endo/Heme/Allergies: Negative.   Psychiatric/Behavioral: Negative.    All other systems reviewed and are negative.   Past Medical History:  Diagnosis Date   Adenocarcinoma of colon [facility])    Atrial fibrillation ([place])    Cataract    Diabetes mellitus without complication ([place])    History of Stercoral ulcer of rectum 11/28/2016   Hyperlipidemia    Hypertension    Lichen planus    bilateral legs   Nausea & vomiting 09/09/2022    Past Surgical History:  Procedure Laterality Date   APPENDECTOMY     COLON RESECTION N/A 11/22/2016   Procedure: HAND ASSISTED LAPAROSCOPIC COLON RESECTION;  Surgeon: [name], MD;  Location: [facility];  Service: General;  Laterality: N/A;   COLONOSCOPY N/A 11/17/2016   Procedure: COLONOSCOPY;  Surgeon: [name], MD;  Location: [facility];  Service: Endoscopy;  Laterality: N/A;   FRACTURE SURGERY     MULTIPLE TOOTH EXTRACTIONS     RECTAL EXAM UNDER ANESTHESIA N/A 12/05/2016   Procedure: RECTAL EXAM UNDER ANESTHESIA, DISIMPACTION;  Surgeon: Alphonsa Overall, MD;  Location: [facility];  Service: General;  Laterality: N/A;     reports that he quit smoking about 39 years ago. His smoking use included pipe. He has never used smokeless tobacco. He reports that he does not drink alcohol and does not use drugs.  Allergies  Allergen Reactions   Other     [name] bandage (self-gripping) - erythema, itching    Family History  Problem Relation Age of Onset   Brain cancer Mother    Alzheimer's disease Father    Cancer  Maternal Grandmother    Heart attack Maternal Grandfather    Pneumonia Paternal Grandfather     Prior to Admission medications   Medication Sig Start Date End Date Taking? Authorizing Provider  apixaban (ELIQUIS) 5 MG TABS tablet Take 1 tablet (5 mg total) by mouth 2 (two) times daily. 02/02/22   Loel Dubonnet, NP  atorvastatin (LIPITOR) 20 MG tablet Take 20 mg by mouth daily. 02/02/21   [provider]  carbamide peroxide (DEBROX) 6.5 % OTIC solution Place 5 drops into the right ear 2 (two) times daily. 12/25/21   Swayze, Ava, DO  dextrose (GLUTOSE) 40 % GEL Take 1 Tube by mouth as needed for low blood sugar.    [provider]  docusate sodium (COLACE) 100 MG capsule Take 1 capsule (100 mg total) by mouth 2 (two) times daily. 12/08/16   Barton Dubois, MD  folic acid (FOLVITE) 1 MG tablet Take 1 mg by mouth See admin instructions. '1mg'$  everyday EXCEPT none on Fridays. 01/29/21   [provider]  furosemide (LASIX) 20 MG tablet Take 1 tablet (20 mg total) by mouth as needed for edema or fluid (If taking more than twice per week, please call cardiology office.). 02/02/22   Loel Dubonnet, NP  glipiZIDE (GLUCOTROL) 10 MG tablet Take 10 mg by mouth 2 (two) times daily before a meal. 01/23/21   [provider]  hydrOXYzine (ATARAX/VISTARIL) 25 MG tablet Take 1 tablet (25 mg total) by mouth every 8 (eight) hours as needed for itching. Use caution as this medication may make you drowsy 07/17/20   Little, Wenda Overland, MD  JANUVIA 100 MG tablet Take 100 mg by mouth daily. 01/20/21   [provider]  metFORMIN (GLUCOPHAGE) 1000 MG tablet Take 1,000 mg by mouth 2 (two) times daily with a meal. 01/23/21   [provider]  methotrexate (RHEUMATREX) 2.5 MG tablet Take 10 mg by mouth once a week. Takes on Fridays. 01/29/21   [provider]  miconazole (MICOTIN) 2 % powder Apply 1 application topically See admin instructions. 1 application topical  twice daily And 1 application daily to groin, stomach, thighs, buttocks area for chafing.    [provider]  pantoprazole (PROTONIX) 40 MG tablet Take 1 tablet (40 mg total) by mouth daily. 12/26/21   Swayze, Ava, DO  polyethylene glycol (MIRALAX) packet Take 17 g by mouth daily. Hold for diarrhea 12/08/16   Barton Dubois, MD  PRESCRIPTION MEDICATION Apply 1 application topically daily. Tac 0.1% Cetaphil 1:4 Apply to lower legs    [provider]  tamsulosin (FLOMAX) 0.4 MG CAPS capsule Take 1 capsule (0.4 mg total) by mouth daily. 11/27/16   Barton Dubois, MD  triamcinolone (KENALOG) 0.1 % Apply 1 application topically See admin instructions. 1 application topical twice daily to itchy areas And 1 extra application daily as needed for dermatitis    [provider]    Physical Exam: Vitals:   09/09/22 1027 09/09/22 1601 09/09/22 1900  BP: (!) 138/58 (!) 145/78 133/71  Pulse: 73 66 77  Resp: '18 16 12  '$ Temp: 98.3 F (36.8 C) 98.1 F (36.7 C)   TempSrc: Oral Oral   SpO2: 100% 99% 100%    Physical Exam Vitals and nursing note reviewed.  Constitutional:      General: He is not in acute distress.    Appearance: Normal appearance. He is obese. He is not ill-appearing, toxic-appearing or diaphoretic.  HENT:     Head: Normocephalic and atraumatic.     Nose: Nose normal.  Eyes:     General: No scleral icterus. Cardiovascular:     Rate and Rhythm: Normal rate. Rhythm irregular.     Pulses: Normal pulses.  Pulmonary:     Effort: Pulmonary effort is normal. No respiratory distress.     Breath sounds: Normal breath sounds. No wheezing or rales.  Abdominal:     General: Abdomen is protuberant. Bowel sounds are normal. There is no distension.     Palpations: Abdomen is soft.     Tenderness: There is abdominal tenderness. There is no guarding or rebound. Negative signs include Murphy's sign.    Musculoskeletal:     Right lower leg: 1+ Edema present.     Left  lower leg: 1+ Edema present.     Comments: Bilateral pretibial venous stasis changes. Symmetric on both lower legs.  Skin:    Capillary Refill: Capillary refill takes less than 2 seconds.  Neurological:     General: No focal deficit present.     Mental Status: He is alert and oriented to person, place, and time.      Labs on Admission: I have personally reviewed following labs and imaging studies  CBC: Recent Labs  Lab 09/09/22 1042  WBC 3.9*  HGB 8.9*  HCT 28.3*  MCV 105.6*  PLT 268*   Basic Metabolic Panel: Recent Labs  Lab 09/09/22 1042  NA 134*  K 4.0  CL 102  CO2 23  GLUCOSE 67*  BUN 18  CREATININE 1.46*  CALCIUM 8.7*   GFR: CrCl cannot be calculated (Unknown ideal weight.). Liver Function Tests: Recent Labs  Lab 09/09/22 1042  AST 20  ALT 8  ALKPHOS 70  BILITOT 0.9  PROT 8.0  ALBUMIN 3.3*   Recent Labs  Lab 09/09/22 1042  LIPASE 36   No results for input(s): "AMMONIA" in the last 168 hours. Coagulation Profile: No results for input(s): "INR", "PROTIME" in the last 168 hours. Cardiac Enzymes: Recent Labs  Lab 09/09/22 1042 09/09/22 1417  TROPONINIHS 9 10   BNP (last 3 results) No results for input(s): "PROBNP" in the last 8760 hours. HbA1C: No results for input(s): "HGBA1C" in the last 72 hours. CBG: Recent Labs  Lab 09/09/22 1927  GLUCAP 55*   Lipid Profile: No results for input(s): "CHOL", "HDL", "LDLCALC", "TRIG", "CHOLHDL", "LDLDIRECT" in the last 72 hours. Thyroid Function Tests: No results for input(s): "TSH", "T4TOTAL", "FREET4", "T3FREE", "THYROIDAB" in the last 72 hours. Anemia Panel: No results for input(s): "VITAMINB12", "FOLATE", "FERRITIN", "TIBC", "IRON", "RETICCTPCT" in the last 72 hours. Urine analysis:    Component Value Date/Time   COLORURINE YELLOW 09/09/2022 1051   APPEARANCEUR CLEAR 09/09/2022 1051   LABSPEC 1.008 09/09/2022 1051   PHURINE 6.0 09/09/2022 1051   GLUCOSEU NEGATIVE 09/09/2022 1051   HGBUR  NEGATIVE 09/09/2022 1051   BILIRUBINUR NEGATIVE 09/09/2022 1051   BILIRUBINUR NEG 05/27/2013 1516   KETONESUR NEGATIVE 09/09/2022 [place] 09/09/2022 1051  UROBILINOGEN 0.2 05/27/2013 1516   NITRITE NEGATIVE 09/09/2022 1051   LEUKOCYTESUR NEGATIVE 09/09/2022 1051    Radiological Exams on Admission: I have personally reviewed images US Abdomen Limited RUQ (LIVER/GB)  Result Date: 09/09/2022 CLINICAL DATA:  Right upper quadrant pain EXAM: ULTRASOUND ABDOMEN LIMITED RIGHT UPPER QUADRANT COMPARISON:  CT abdomen and pelvis same day FINDINGS: Gallbladder: Multiple gallstones are present measuring up to 1.8 cm. Gallbladder sludge is present. Gallbladder wall is borderline thickened measuring 3 mm. Common bile duct: Diameter: Not visualized Liver: No focal lesion identified. Increase in parenchymal echogenicity. Portal vein is patent on color Doppler imaging with normal direction of blood flow towards the liver. Other: None. IMPRESSION: 1. Cholelithiasis and gallbladder sludge. Gallbladder wall is borderline thickened measuring 3 mm. Findings are equivocal for acute cholecystitis. 2. Hepatic steatosis. Electronically Signed   By: Ronney Asters M.D.   On: 09/09/2022 18:41   CT ABDOMEN PELVIS W CONTRAST  Result Date: 09/09/2022 CLINICAL DATA:  Acute epigastric abdominal pain. EXAM: CT ABDOMEN AND PELVIS WITH CONTRAST TECHNIQUE: Multidetector CT imaging of the abdomen and pelvis was performed using the standard protocol following bolus administration of intravenous contrast. RADIATION DOSE REDUCTION: This exam was performed according to the departmental dose-optimization program which includes automated exposure control, adjustment of the mA and/or kV according to patient size and/or use of iterative reconstruction technique. CONTRAST:  135m OMNIPAQUE IOHEXOL 350 MG/ML SOLN COMPARISON:  December 22, 2021. FINDINGS: Lower chest: Minimal bilateral pleural effusions are noted with minimal  adjacent subsegmental atelectasis. Hepatobiliary: No biliary dilatation is noted. Cholelithiasis is noted with 1.8 cm stone noted in neck of gallbladder. There are now noted mild inflammatory changes around the gallbladder concerning for possible cholecystitis. Liver is unremarkable. Pancreas: Unremarkable. No pancreatic ductal dilatation or surrounding inflammatory changes. Spleen: Normal in size without focal abnormality. Adrenals/Urinary Tract: Adrenal glands appear normal. Parapelvic cysts are seen on the left. No hydronephrosis or renal obstruction is noted. Urinary bladder is unremarkable. Stomach/Bowel: Stomach appears normal. There is no evidence of bowel obstruction or inflammation. Postsurgical changes are seen involving the right colon. Sigmoid diverticulosis is noted without inflammation. Vascular/Lymphatic: Aortic atherosclerosis. No enlarged abdominal or pelvic lymph nodes. Reproductive: Prostate is unremarkable. Other: Small left inguinal hernia is noted which contains a portion of sigmoid colon, but does not result in obstruction. Musculoskeletal: No acute or significant osseous findings. IMPRESSION: Cholelithiasis is noted with mild inflammatory stranding surrounding gallbladder concerning for acute cholecystitis. Ultrasound is recommended for further evaluation. Minimal bilateral pleural effusions are noted. Sigmoid diverticulosis without inflammation. Small left inguinal hernia is noted which contains a portion of sigmoid colon, but does not result in obstruction. Aortic Atherosclerosis (ICD10-I70.0). Electronically Signed   By: JMarijo ConceptionM.D.   On: 09/09/2022 16:39   CT Angio Chest PE W and/or Wo Contrast  Result Date: 09/09/2022 CLINICAL DATA:  Epigastric pain and shortness of breath. EXAM: CT ANGIOGRAPHY CHEST WITH CONTRAST TECHNIQUE: Multidetector CT imaging of the chest was performed using the standard protocol during bolus administration of intravenous contrast. Multiplanar CT  image reconstructions and MIPs were obtained to evaluate the vascular anatomy. RADIATION DOSE REDUCTION: This exam was performed according to the departmental dose-optimization program which includes automated exposure control, adjustment of the mA and/or kV according to patient size and/or use of iterative reconstruction technique. CONTRAST:  1022mOMNIPAQUE IOHEXOL 350 MG/ML SOLN COMPARISON:  None Available. FINDINGS: Cardiovascular: The pulmonary arteries are adequately opacified. There is no evidence of pulmonary embolism. Central pulmonary arteries are of  normal caliber. The thoracic aorta is normal in caliber. No significant aortic atherosclerosis. The heart size is normal. No pericardial fluid identified. No visualized calcified coronary artery plaque. Mediastinum/Nodes: There are some scattered small and mildly enlarged lymph nodes throughout the mediastinum. The largest in the right lower paratracheal region measures approximately 12 mm in short axis. These are nonspecific. No enlarged hilar or axillary lymph nodes identified. Thyroid gland, trachea, and esophagus demonstrate no significant findings. Lungs/Pleura: There is no evidence of pulmonary edema, consolidation, pneumothorax, nodule or pleural fluid. Upper Abdomen: Decreased attenuation of the liver parenchyma is consistent with steatosis. Musculoskeletal: No chest wall abnormality. No acute or significant osseous findings. Review of the MIP images confirms the above findings. IMPRESSION: 1. No evidence of pulmonary embolism or other acute findings in the chest. 2. Scattered small and mildly enlarged lymph nodes throughout the mediastinum which are nonspecific. 3. Hepatic steatosis. Electronically Signed   By: Aletta Edouard M.D.   On: 09/09/2022 16:37   DG Chest 2 View  Result Date: 09/09/2022 CLINICAL DATA:  Shortness of breath. EXAM: CHEST - 2 VIEW COMPARISON:  02/18/2021 FINDINGS: Stable cardiomediastinal contours. Lung volumes are low.  Blunting of the costophrenic angles may reflect small pleural effusions. No significant interstitial edema or airspace disease. Visualized osseous structures are unremarkable. IMPRESSION: 1. Low lung volumes. 2. Suspect small bilateral pleural effusions. Electronically Signed   By: Kerby Moors M.D.   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 IV zosyn. Prn oxycodone. Keep NPO. IVF. Holding Eliquis in case he needs surgery or cholecystostomy tube.  AKI (acute kidney injury) ([facility]) Mild AKI. Continue with IVF. Avoid nephrotoxic agents, NSAIDs.  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 Needs MAR verified by pharmacy tech.  Obesity (BMI 30-39.9) Chronic.  Venous stasis ulcers- BLE Chronic. Pt does not have cellulitis.  Anemia Stable.  Personal history of cecal colon cancer S/p right colectomy in 2017  Chronic anticoagulation On Eliquis as outpatient. Holding for acute cholecystitis.  CKD (chronic kidney disease) stage 2, GFR 60-89 ml/min Acutely worse. Baseline Scr 1-1.2. continue with IVF. Avoid nephrotoxic agents.  BPH with obstruction/lower urinary tract symptoms Stable. Continue flomax 0.4 mg  Constipation, chronic Chronic.   Diabetes mellitus type 2 in obese ([facility]) Add SSI. Hold oral DM meds.  Hyperlipidemia Stable.  Continue lipitor 20 mg daily.  Atrial fibrillation ([facility]) Rate controlled afib. Holding Eliquis.    DVT prophylaxis: SCDs Code Status: Full Code Family Communication: no family at bedside  Disposition Plan: return home  Consults called: EDP has consulted Dr. Johney Maine with general surgery  Admission status: Observation, Med-Surg   Kristopher Oppenheim, DO [facility] 09/09/2022, 7:36 PM

## 2022-09-10 ENCOUNTER — Observation Stay (HOSPITAL_BASED_OUTPATIENT_CLINIC_OR_DEPARTMENT_OTHER): Payer: Medicare HMO

## 2022-09-10 ENCOUNTER — Observation Stay (HOSPITAL_COMMUNITY): Payer: Medicare HMO

## 2022-09-10 ENCOUNTER — Encounter (HOSPITAL_COMMUNITY): Payer: Self-pay | Admitting: Internal Medicine

## 2022-09-10 DIAGNOSIS — Z7901 Long term (current) use of anticoagulants: Secondary | ICD-10-CM

## 2022-09-10 DIAGNOSIS — N138 Other obstructive and reflux uropathy: Secondary | ICD-10-CM

## 2022-09-10 DIAGNOSIS — K8000 Calculus of gallbladder with acute cholecystitis without obstruction: Secondary | ICD-10-CM | POA: Diagnosis not present

## 2022-09-10 DIAGNOSIS — N179 Acute kidney failure, unspecified: Secondary | ICD-10-CM

## 2022-09-10 DIAGNOSIS — E871 Hypo-osmolality and hyponatremia: Secondary | ICD-10-CM

## 2022-09-10 DIAGNOSIS — Z0189 Encounter for other specified special examinations: Secondary | ICD-10-CM

## 2022-09-10 DIAGNOSIS — N401 Enlarged prostate with lower urinary tract symptoms: Secondary | ICD-10-CM | POA: Diagnosis not present

## 2022-09-10 DIAGNOSIS — Z79899 Other long term (current) drug therapy: Secondary | ICD-10-CM

## 2022-09-10 DIAGNOSIS — Z593 Problems related to living in residential institution: Secondary | ICD-10-CM

## 2022-09-10 DIAGNOSIS — I10 Essential (primary) hypertension: Secondary | ICD-10-CM

## 2022-09-10 DIAGNOSIS — L239 Allergic contact dermatitis, unspecified cause: Secondary | ICD-10-CM

## 2022-09-10 DIAGNOSIS — I83009 Varicose veins of unspecified lower extremity with ulcer of unspecified site: Secondary | ICD-10-CM

## 2022-09-10 DIAGNOSIS — I5032 Chronic diastolic (congestive) heart failure: Secondary | ICD-10-CM

## 2022-09-10 DIAGNOSIS — I480 Paroxysmal atrial fibrillation: Secondary | ICD-10-CM

## 2022-09-10 DIAGNOSIS — D509 Iron deficiency anemia, unspecified: Secondary | ICD-10-CM

## 2022-09-10 DIAGNOSIS — L97909 Non-pressure chronic ulcer of unspecified part of unspecified lower leg with unspecified severity: Secondary | ICD-10-CM

## 2022-09-10 DIAGNOSIS — D84821 Immunodeficiency due to drugs: Secondary | ICD-10-CM

## 2022-09-10 LAB — CBC WITH DIFFERENTIAL/PLATELET
Abs Immature Granulocytes: 0.02 10*3/uL (ref 0.00–0.07)
Basophils Absolute: 0 10*3/uL (ref 0.0–0.1)
Basophils Relative: 1 %
Eosinophils Absolute: 0.1 10*3/uL (ref 0.0–0.5)
Eosinophils Relative: 2 %
HCT: 26.2 % — ABNORMAL LOW (ref 39.0–52.0)
Hemoglobin: 8.2 g/dL — ABNORMAL LOW (ref 13.0–17.0)
Immature Granulocytes: 1 %
Lymphocytes Relative: 7 %
Lymphs Abs: 0.3 10*3/uL — ABNORMAL LOW (ref 0.7–4.0)
MCH: 32.9 pg (ref 26.0–34.0)
MCHC: 31.3 g/dL (ref 30.0–36.0)
MCV: 105.2 fL — ABNORMAL HIGH (ref 80.0–100.0)
Monocytes Absolute: 0.2 10*3/uL (ref 0.1–1.0)
Monocytes Relative: 5 %
Neutro Abs: 3.7 10*3/uL (ref 1.7–7.7)
Neutrophils Relative %: 84 %
Platelets: 130 10*3/uL — ABNORMAL LOW (ref 150–400)
RBC: 2.49 MIL/uL — ABNORMAL LOW (ref 4.22–5.81)
RDW: 18.8 % — ABNORMAL HIGH (ref 11.5–15.5)
WBC: 4.4 10*3/uL (ref 4.0–10.5)
nRBC: 0 % (ref 0.0–0.2)

## 2022-09-10 LAB — COMPREHENSIVE METABOLIC PANEL
ALT: 10 U/L (ref 0–44)
AST: 13 U/L — ABNORMAL LOW (ref 15–41)
Albumin: 2.9 g/dL — ABNORMAL LOW (ref 3.5–5.0)
Alkaline Phosphatase: 72 U/L (ref 38–126)
Anion gap: 9 (ref 5–15)
BUN: 18 mg/dL (ref 8–23)
CO2: 26 mmol/L (ref 22–32)
Calcium: 8.3 mg/dL — ABNORMAL LOW (ref 8.9–10.3)
Chloride: 101 mmol/L (ref 98–111)
Creatinine, Ser: 1.38 mg/dL — ABNORMAL HIGH (ref 0.61–1.24)
GFR, Estimated: 55 mL/min — ABNORMAL LOW (ref >60)
Glucose, Bld: 77 mg/dL (ref 70–99)
Potassium: 3.8 mmol/L (ref 3.5–5.1)
Sodium: 136 mmol/L (ref 135–145)
Total Bilirubin: 1.2 mg/dL (ref 0.3–1.2)
Total Protein: 7.3 g/dL (ref 6.5–8.1)

## 2022-09-10 LAB — GLUCOSE, CAPILLARY
Glucose-Capillary: 105 mg/dL — ABNORMAL HIGH (ref 70–99)
Glucose-Capillary: 133 mg/dL — ABNORMAL HIGH (ref 70–99)
Glucose-Capillary: 284 mg/dL — ABNORMAL HIGH (ref 70–99)
Glucose-Capillary: 71 mg/dL (ref 70–99)
Glucose-Capillary: 74 mg/dL (ref 70–99)
Glucose-Capillary: 75 mg/dL (ref 70–99)
Glucose-Capillary: 78 mg/dL (ref 70–99)

## 2022-09-10 LAB — HEMOGLOBIN A1C
Hgb A1c MFr Bld: 5.3 % (ref 4.8–5.6)
Mean Plasma Glucose: 105.41 mg/dL

## 2022-09-10 LAB — ECHOCARDIOGRAM COMPLETE
AR max vel: 4.65 cm2
AV Peak grad: 6.6 mmHg
Ao pk vel: 1.28 m/s
Area-P 1/2: 4.68 cm2
Height: 75 in
S' Lateral: 2.8 cm
Weight: 4896 oz

## 2022-09-10 LAB — MAGNESIUM: Magnesium: 1.9 mg/dL (ref 1.7–2.4)

## 2022-09-10 LAB — PREALBUMIN: Prealbumin: 10 mg/dL — ABNORMAL LOW (ref 18–38)

## 2022-09-10 MED ORDER — TECHNETIUM TC 99M MEBROFENIN IV KIT
5.5000 | PACK | Freq: Once | INTRAVENOUS | Status: AC
  Administered 2022-09-10: 5.5 via INTRAVENOUS

## 2022-09-10 MED ORDER — MORPHINE SULFATE (PF) 2 MG/ML IV SOLN
INTRAVENOUS | Status: AC
  Filled 2022-09-10: qty 2

## 2022-09-10 MED ORDER — MORPHINE SULFATE (PF) 4 MG/ML IV SOLN
3.0000 mg | Freq: Once | INTRAVENOUS | Status: AC
  Administered 2022-09-10: 3 mg via INTRAVENOUS

## 2022-09-10 NOTE — Consult Note (Addendum)
Consult Note  [name] 07-02-1951  [id number].    Requesting MD: Dr. Bridgett Larsson Chief Complaint/Reason for Consult: acute cholecystitis  HPI:  71 y.o. male with medical history significant for a fib on eliquis, T2DM, h/o chronic constipation and stercoral ulcer (2017), HTN, colonic adenocarcinoma s/p partial colectomy, venous stasis ulcers who presented to [facility] ED 9/28 with abdominal pain. Pain had been present for 4 days. He had associated nausea and dry heaving once 2 days ago. He has had loss of appetite. He ate 2 pb&j sandwiches Wednesday evening because symptoms had improved but had recurrence of severe abdominal pain Thursday morning prompting presentation to ED. He has not had food since Wednesday night. He had constipation for 3-4 days but had large normal nonbloody, non-melanotic BM yesterday. He has not noticed a prior relationship between PO intake of fatty food and abdominal discomfort. No current nausea.  He has history of very similar pain and nausea episode 8 months ago when he was found to have symptomatic bradycardia - a fib with slow ventricular response. ROS otherwise as below  Work up in ED significant for cholelithiasis on CT and [place] with borderline GB wall thickening on [place]. Also had CTA chest completed which showed scattered small and mildly enlarged lymph nodes. He was admitted to the medicine service and general surgery asked to evaluate in regard to possible cholecystitis.  He has history of T1N0 colon cancer and underwent laparoscopic hand assisted lysis of adhesions and right colectomy by Dr. Zella Richer in 2017. He saw Dr. Fuller Plan with [facility] for colonoscopy preoperatively but has not had follow up colonoscopy since surgery. He has history of chronic constipation/stercoral ulcer in his chart and states this occurred post operatively in 2017 but he has not had issues since. He takes miralax daily and has regular bowel movements.   Also has prior history of  appendectomy at 71 years old On eliquis for afib with last dose 9/28 ~0800  ROS: Review of Systems  Constitutional:  Negative for chills and fever.  Respiratory:  Positive for cough. Negative for shortness of breath and wheezing.   Cardiovascular:  Positive for leg swelling (chronic). Negative for chest pain and palpitations.  Gastrointestinal:  Positive for abdominal pain and nausea. Negative for blood in stool, constipation, diarrhea, melena and vomiting.    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   Cataract    Constipation, chronic 11/09/2016   Diabetes mellitus without complication ([place])    History of COVID-19 09/09/2022   History of homelessness 09/09/2022   History of kidney stones 09/09/2022   History of Stercoral ulcer of rectum 11/28/2016   Hyperlipidemia    Hypertension    Lichen planus    bilateral legs   Nausea & vomiting 09/09/2022   Obesity (BMI 30-39.9) 09/09/2022   Personal history of cecal colon cancer 09/09/2022    Past Surgical History:  Procedure Laterality Date   APPENDECTOMY     COLON RESECTION N/A 11/22/2016   Procedure: HAND ASSISTED LAPAROSCOPIC COLON RESECTION;  Surgeon: [name], MD;  Location: [facility];  Service: General;  Laterality: N/A;  COLONOSCOPY N/A 11/17/2016   Procedure: COLONOSCOPY;  Surgeon: [name], MD;  Location: [facility];  Service: Endoscopy;  Laterality: N/A;   FRACTURE SURGERY     MULTIPLE TOOTH EXTRACTIONS     RECTAL EXAM UNDER ANESTHESIA N/A 12/05/2016   Procedure: RECTAL EXAM UNDER ANESTHESIA, DISIMPACTION;  Surgeon: Alphonsa Overall, MD;  Location: [facility];  Service: General;  Laterality:  N/A;    Social History:  reports that he quit smoking about 39 years ago. His smoking use included pipe. He has never used smokeless tobacco. He reports that he does not drink alcohol and does not use drugs.  Allergies:  Allergies  Allergen Reactions   Other     [name] bandage (self-gripping) - erythema, itching    Medications Prior to Admission  Medication Sig Dispense Refill   apixaban (ELIQUIS) 5 MG TABS tablet Take 1 tablet (5 mg total) by mouth 2 (two) times daily. 60 tablet 5   atorvastatin (LIPITOR) 20 MG tablet Take 20 mg by mouth daily.     dextrose (GLUTOSE) 40 % GEL Take 1 Tube by mouth once as needed for low blood sugar. Give Glucose Gel by mouth for BS<70. Repeat CBG in 16 minutes, Repeat Gel & Recheck CBG in 15 minutes. Call MD if Deemed Necessary. Call 911 if Resident is Symptomatic, Not Alert or Unconscious     docusate sodium (COLACE) 100 MG capsule Take 1 capsule (100 mg total) by mouth 2 (two) times daily.     folic acid (FOLVITE) 1 MG tablet Take 1 mg by mouth See admin instructions. 30m everyday EXCEPT none on Fridays.     furosemide (LASIX) 80 MG tablet Take 80 mg by mouth daily.     glipiZIDE (GLUCOTROL) 10 MG tablet Take 10 mg by mouth 2 (two) times daily before a meal.     hydrOXYzine (ATARAX/VISTARIL) 25 MG tablet Take 1 tablet (25 mg total) by mouth every 8 (eight) hours as needed for itching. Use caution as this medication may make you drowsy 10 tablet 0   JANUVIA 100 MG tablet Take 100 mg by mouth daily.     metFORMIN (GLUCOPHAGE) 1000 MG tablet Take 1,000 mg by mouth 2 (two) times daily with a meal.     methotrexate (RHEUMATREX) 2.5 MG tablet Take 10 mg by mouth once a week. Takes on Fridays.     pantoprazole (PROTONIX) 40 MG tablet Take 1 tablet (40 mg total) by mouth daily. 30 tablet 0   polyethylene glycol (MIRALAX) packet Take 17 g by mouth daily. Hold for diarrhea     potassium chloride SA (KLOR-CON M) 20 MEQ tablet Take 20 mEq by mouth daily.      PRESCRIPTION MEDICATION Apply 1 application topically daily. Tac 0.1% Cetaphil 1:4 Apply to lower legs     tamsulosin (FLOMAX) 0.4 MG CAPS capsule Take 1 capsule (0.4 mg total) by mouth daily.     triamcinolone (KENALOG) 0.1 % Apply 1 application topically See admin instructions. 1 application topical twice daily to itchy areas And 1 extra application daily as needed for dermatitis     vitamin B-12 (CYANOCOBALAMIN) 500 MCG tablet Take 500 mcg by mouth daily.     carbamide peroxide (DEBROX) 6.5 % OTIC solution Place 5 drops into the right ear 2 (two) times daily. (Patient not taking: Reported on 09/10/2022) 15 mL 0   furosemide (LASIX) 20 MG tablet Take 1 tablet (20 mg total) by mouth as needed for edema or fluid (If taking  more than twice per week, please call cardiology office.). (Patient not taking: Reported on 09/10/2022) 30 tablet 1    Blood pressure (!) 116/59, pulse 68, temperature 98.6 F (37 C), temperature source Oral, resp. rate 17, SpO2 91 %. Physical Exam: General: pleasant, WD, male who is laying in bed in NAD HEENT: head is normocephalic, atraumatic.  Sclera are noninjected.  Pupils equal and round. EOMs intact.  Ears and nose without any masses or lesions.  Mouth is pink and moist Lungs:Respiratory effort nonlabored Abd: soft, distension difficult to assess given body habitus, +BS, mild diffuse TTP with focal moderate TPP in RUQ and epigastrium MSK: all 4 extremities are symmetrical, no cyanosis Skin: warm and dry with no masses, lesions, or rashes Neuro: Cranial nerves 2-12 grossly intact, sensation is normal throughout Psych: A&Ox3 with an appropriate affect.    Results for orders placed or performed during the hospital encounter of 09/09/22 (from the past 48 hour(s))  Lipase, blood     Status: None   Collection Time: 09/09/22 10:42 AM  Result Value Ref Range   Lipase 36 11 - 51 U/L    Comment: Performed at [facility], [place] [address],  [place], [place] [address]  Comprehensive metabolic panel     Status: Abnormal   Collection Time: 09/09/22 10:42 AM  Result Value Ref Range   Sodium 134 (L) 135 - 145 mmol/L   Potassium 4.0 3.5 - 5.1 mmol/L   Chloride 102 98 - 111 mmol/L   CO2 23 22 - 32 mmol/L   Glucose, Bld 67 (L) 70 - 99 mg/dL    Comment: Glucose reference range applies only to samples taken after fasting for at least 8 hours.   BUN 18 8 - 23 mg/dL   Creatinine, Ser 1.46 (H) 0.61 - 1.24 mg/dL   Calcium 8.7 (L) 8.9 - 10.3 mg/dL   Total Protein 8.0 6.5 - 8.1 g/dL   Albumin 3.3 (L) 3.5 - 5.0 g/dL   AST 20 15 - 41 U/L   ALT 8 0 - 44 U/L   Alkaline Phosphatase 70 38 - 126 U/L   Total Bilirubin 0.9 0.3 - 1.2 mg/dL   GFR, Estimated 51 (L) >60 mL/min    Comment: (NOTE) Calculated using the CKD-EPI Creatinine Equation (2021)    Anion gap 9 5 - 15    Comment: Performed at [facility], [place] [address] [place], [place] [address]  CBC     Status: Abnormal   Collection Time: 09/09/22 10:42 AM  Result Value Ref Range   WBC 3.9 (L) 4.0 - 10.5 K/uL   RBC 2.68 (L) 4.22 - 5.81 MIL/uL   Hemoglobin 8.9 (L) 13.0 - 17.0 g/dL   HCT 28.3 (L) 39.0 - 52.0 %   MCV 105.6 (H) 80.0 - 100.0 fL   MCH 33.2 26.0 - 34.0 pg   MCHC 31.4 30.0 - 36.0 g/dL   RDW 18.6 (H) 11.5 - 15.5 %   Platelets 146 (L) 150 - 400 K/uL   nRBC 0.0 0.0 - 0.2 %    Comment: Performed at [facility], [place] [address] [place], [place] [address]  Troponin I (High Sensitivity)     Status: None   Collection Time: 09/09/22 10:42 AM  Result Value Ref Range   Troponin I (High Sensitivity) 9 <18 ng/L    Comment: (NOTE) Elevated high sensitivity troponin I (hsTnI) values and significant  changes across serial measurements may suggest ACS but many other  chronic and acute conditions are known to elevate hsTnI results.  Refer to the "Links" section for chest pain algorithms and additional  guidance. Performed at [facility], [place] [address], [place], [place] [address]   Brain natriuretic peptide     Status: Abnormal   Collection Time: 09/09/22 10:42 AM  Result Value Ref Range   B Natriuretic Peptide 135.1 (H) 0.0 - 100.0 pg/mL    Comment: Performed at [facility], [place] [address], [place], [place] [address]  Urinalysis, Routine w reflex microscopic     Status: None   Collection Time: 09/09/22 10:51 AM  Result Value Ref Range   Color, Urine YELLOW YELLOW   APPearance CLEAR CLEAR   Specific Gravity, Urine 1.008 1.005 - 1.030   pH 6.0 5.0 - 8.0   Glucose, UA NEGATIVE NEGATIVE mg/dL   Hgb urine dipstick NEGATIVE NEGATIVE   Bilirubin Urine NEGATIVE NEGATIVE   Ketones, ur NEGATIVE NEGATIVE mg/dL   Protein, ur NEGATIVE NEGATIVE mg/dL   Nitrite NEGATIVE NEGATIVE   Leukocytes,Ua NEGATIVE NEGATIVE    Comment: Performed at [facility], [place] [address], [place], [place] [address]  Troponin I (High Sensitivity)     Status: None   Collection Time: 09/09/22  2:17 PM  Result Value Ref Range   Troponin I (High Sensitivity) 10 <18 ng/L    Comment: (NOTE) Elevated high sensitivity troponin I (hsTnI) values and significant  changes across serial measurements may suggest ACS but many other  chronic and acute conditions are known to elevate hsTnI results.  Refer to the "Links" section for chest pain algorithms and additional  guidance. Performed at [facility], [place] [address], [place], [place] [address]   SARS Coronavirus 2 by RT PCR (hospital order, performed in [facility] hospital lab) *cepheid single result test* Anterior Nasal Swab     Status: None   Collection Time: 09/09/22  4:30 PM   Specimen: Anterior Nasal Swab  Result Value Ref Range   SARS Coronavirus 2 by RT PCR NEGATIVE NEGATIVE    Comment: (NOTE) SARS-CoV-2 target nucleic acids are NOT DETECTED.  The SARS-CoV-2 RNA is generally detectable in upper and lower respiratory specimens  during the acute phase of infection. The lowest concentration of SARS-CoV-2 viral copies this assay can detect is 250 copies / mL. A negative result does not preclude SARS-CoV-2 infection and should not be used as the sole basis for treatment or other patient management decisions.  A negative result may occur with improper specimen collection / handling, submission of specimen other than nasopharyngeal swab, presence of viral mutation(s) within the areas targeted by this assay, and inadequate number of viral copies (<250 copies / mL). A negative result must be combined with clinical observations, patient history, and epidemiological information.  Fact Sheet for Patients:   https://www.patel.info/  Fact Sheet for Healthcare Providers: https://hall.com/  This test is not yet approved or  cleared by the [place] FDA and has been authorized for detection and/or diagnosis of SARS-CoV-2 by FDA under an Emergency Use Authorization (EUA).  This EUA will remain in effect (meaning this test can be used) for the duration of the COVID-19 declaration under Section 564(b)(1) of the Act, 21 U.S.C. section 360bbb-3(b)(1), unless the authorization is terminated or revoked sooner.  Performed at [facility], [place] [address] [place], [place] [address]   CBG monitoring, ED     Status: Abnormal   Collection Time: 09/09/22  7:27 PM  Result Value Ref Range   Glucose-Capillary 67 (L) 70 - 99 mg/dL    Comment: Glucose reference range applies only to samples taken after fasting for at least 8 hours.  Hemoglobin A1c     Status: None   Collection Time: 09/09/22  8:43 PM  Result Value Ref Range   Hgb A1c MFr Bld 5.3 4.8 - 5.6 %    Comment: (NOTE) Pre diabetes:          5.7%-6.4%  Diabetes:              >6.4%  Glycemic control for   <7.0% adults with diabetes    Mean Plasma Glucose 105.41 mg/dL    Comment: Performed at [place] [address] [place], [place] [address]  Vitamin B12     Status: None   Collection Time: 09/09/22  8:43 PM  Result Value Ref Range   Vitamin B-12 213 180 - 914 pg/mL    Comment: (NOTE) This assay is not validated for testing neonatal or myeloproliferative syndrome specimens for Vitamin B12 levels. Performed at [facility], [place] [address], [place], [place] [address]   Folate     Status: None   Collection Time: 09/09/22  8:43 PM  Result Value Ref Range   Folate 31.1 >5.9 ng/mL    Comment: RESULT CONFIRMED BY MANUAL DILUTION Performed at [place] [address], [place], [place] [address]   Iron and TIBC     Status: Abnormal   Collection Time: 09/09/22  8:43 PM  Result Value Ref Range   Iron 35 (L) 45 - 182 ug/dL   TIBC 374 250 - 450 ug/dL   Saturation Ratios 9 (L) 17.9 - 39.5 %   UIBC 339 ug/dL    Comment: Performed at [facility], [place] [address] [place], [place] [address]  Ferritin     Status: None   Collection Time: 09/09/22  8:43 PM  Result Value Ref Range   Ferritin 33 24 - 336 ng/mL    Comment: Performed at [facility], [place] [address], [place], [place] [address]  Reticulocytes     Status: Abnormal   Collection Time: 09/09/22  8:43 PM  Result Value Ref Range   Retic Ct Pct 2.0 0.4 - 3.1 %   RBC. 2.81 (L) 4.22 - 5.81 MIL/uL   Retic Count, Absolute 57.0 19.0 - 186.0 K/uL   Immature Retic Fract 18.7 (H) 2.3 - 15.9 %    Comment: Performed at [facility], [place] [address], [place], [place] [address]  Protime-INR     Status: Abnormal   Collection Time: 09/09/22  8:43 PM  Result Value Ref Range   Prothrombin Time 17.9 (H) 11.4 - 15.2 seconds   INR 1.5 (H) 0.8 - 1.2    Comment: (NOTE) INR goal varies based on device and disease states. Performed at [facility], [place] [address], [place], [place] [address]   Phosphorus     Status: None    Collection Time: 09/09/22  8:43 PM  Result Value Ref Range   Phosphorus 3.8 2.5 - 4.6 mg/dL    Comment: Performed at [facility], [place] [address] [place],  [address]  Prealbumin     Status: Abnormal   Collection Time: 09/09/22  8:43 PM  Result Value Ref Range   Prealbumin 10 (L) 18 - 38 mg/dL    Comment: Performed at [facility],  [address], [place], [place] [address]  Glucose, capillary     Status: None   Collection Time: 09/09/22 10:21 PM  Result Value Ref Range   Glucose-Capillary 78 70 - 99 mg/dL    Comment: Glucose reference range applies only to samples taken after fasting for at least 8 hours.  Glucose, capillary     Status: None   Collection Time: 09/10/22 12:27 AM  Result Value Ref Range   Glucose-Capillary 71 70 - 99 mg/dL    Comment: Glucose reference range applies only to samples taken after fasting for at least 8 hours.  Glucose, capillary     Status: None   Collection Time: 09/10/22  4:38 AM  Result Value Ref Range   Glucose-Capillary 78 70 - 99 mg/dL    Comment: Glucose reference range applies only to samples taken after fasting for at least 8 hours.  Comprehensive metabolic panel     Status: Abnormal   Collection Time: 09/10/22  5:11 AM  Result Value Ref Range   Sodium 136 135 - 145 mmol/L   Potassium 3.8 3.5 - 5.1 mmol/L   Chloride 101 98 - 111 mmol/L   CO2 26 22 - 32 mmol/L   Glucose, Bld 77 70 - 99 mg/dL    Comment: Glucose reference range applies only to samples taken after fasting for at least 8 hours.   BUN 18 8 - 23 mg/dL   Creatinine, Ser 1.38 (H) 0.61 - 1.24 mg/dL   Calcium 8.3 (L) 8.9 - 10.3 mg/dL   Total Protein 7.3 6.5 - 8.1 g/dL   Albumin 2.9 (L) 3.5 - 5.0 g/dL   AST 13 (L) 15 - 41 U/L   ALT 10 0 - 44 U/L   Alkaline Phosphatase 72 38 - 126 U/L   Total Bilirubin 1.2 0.3 - 1.2 mg/dL   GFR, Estimated 55 (L) >60 mL/min    Comment: (NOTE) Calculated using the CKD-EPI Creatinine Equation (2021)    Anion gap 9 5  - 15    Comment: Performed at [facility], [place] [address] [place], [place] [address]  CBC with Differential/Platelet     Status: Abnormal   Collection Time: 09/10/22  5:11 AM  Result Value Ref Range   WBC 4.4 4.0 - 10.5 K/uL   RBC 2.49 (L) 4.22 - 5.81 MIL/uL   Hemoglobin 8.2 (L) 13.0 - 17.0 g/dL   HCT 26.2 (L) 39.0 - 52.0 %   MCV 105.2 (H) 80.0 - 100.0 fL   MCH 32.9 26.0 - 34.0 pg   MCHC 31.3 30.0 - 36.0 g/dL   RDW 18.8 (H) 11.5 - 15.5 %   Platelets 130 (L) 150 - 400 K/uL   nRBC 0.0 0.0 - 0.2 %   Neutrophils Relative % 84 %   Neutro Abs 3.7 1.7 - 7.7 K/uL   Lymphocytes Relative 7 %   Lymphs Abs 0.3 (L) 0.7 - 4.0 K/uL   Monocytes Relative 5 %   Monocytes Absolute 0.2 0.1 - 1.0 K/uL   Eosinophils Relative 2 %   Eosinophils Absolute 0.1 0.0 - 0.5 K/uL   Basophils Relative 1 %   Basophils Absolute 0.0 0.0 - 0.1 K/uL   Immature Granulocytes 1 %   Abs Immature Granulocytes 0.02 0.00 - 0.07 K/uL    Comment: Performed at [facility], [place] [address], [place], [place] [address]  Magnesium     Status: None   Collection Time: 09/10/22  5:11 AM  Result Value  Ref Range   Magnesium 1.9 1.7 - 2.4 mg/dL    Comment: Performed at [facility], [place] [address], [place], [place] [address]  Glucose, capillary     Status: None   Collection Time: 09/10/22  7:20 AM  Result Value Ref Range   Glucose-Capillary 75 70 - 99 mg/dL    Comment: Glucose reference range applies only to samples taken after fasting for at least 8 hours.   US Abdomen Limited RUQ (LIVER/GB)  Result Date: 09/09/2022 CLINICAL DATA:  Right upper quadrant pain EXAM: ULTRASOUND ABDOMEN LIMITED RIGHT UPPER QUADRANT COMPARISON:  CT abdomen and pelvis same day FINDINGS: Gallbladder: Multiple gallstones are present measuring up to 1.8 cm. Gallbladder sludge is present. Gallbladder wall is borderline thickened measuring 3 mm. Common bile duct: Diameter: Not visualized Liver: No  focal lesion identified. Increase in parenchymal echogenicity. Portal vein is patent on color Doppler imaging with normal direction of blood flow towards the liver. Other: None. IMPRESSION: 1. Cholelithiasis and gallbladder sludge. Gallbladder wall is borderline thickened measuring 3 mm. Findings are equivocal for acute cholecystitis. 2. Hepatic steatosis. Electronically Signed   By: Ronney Asters M.D.   On: 09/09/2022 18:41   CT ABDOMEN PELVIS W CONTRAST  Result Date: 09/09/2022 CLINICAL DATA:  Acute epigastric abdominal pain. EXAM: CT ABDOMEN AND PELVIS WITH CONTRAST TECHNIQUE: Multidetector CT imaging of the abdomen and pelvis was performed using the standard protocol following bolus administration of intravenous contrast. RADIATION DOSE REDUCTION: This exam was performed according to the departmental dose-optimization program which includes automated exposure control, adjustment of the mA and/or kV according to patient size and/or use of iterative reconstruction technique. CONTRAST:  122m OMNIPAQUE IOHEXOL 350 MG/ML SOLN COMPARISON:  December 22, 2021. FINDINGS: Lower chest: Minimal bilateral pleural effusions are noted with minimal adjacent subsegmental atelectasis. Hepatobiliary: No biliary dilatation is noted. Cholelithiasis is noted with 1.8 cm stone noted in neck of gallbladder. There are now noted mild inflammatory changes around the gallbladder concerning for possible cholecystitis. Liver is unremarkable. Pancreas: Unremarkable. No pancreatic ductal dilatation or surrounding inflammatory changes. Spleen: Normal in size without focal abnormality. Adrenals/Urinary Tract: Adrenal glands appear normal. Parapelvic cysts are seen on the left. No hydronephrosis or renal obstruction is noted. Urinary bladder is unremarkable. Stomach/Bowel: Stomach appears normal. There is no evidence of bowel obstruction or inflammation. Postsurgical changes are seen involving the right colon. Sigmoid diverticulosis is noted  without inflammation. Vascular/Lymphatic: Aortic atherosclerosis. No enlarged abdominal or pelvic lymph nodes. Reproductive: Prostate is unremarkable. Other: Small left inguinal hernia is noted which contains a portion of sigmoid colon, but does not result in obstruction. Musculoskeletal: No acute or significant osseous findings. IMPRESSION: Cholelithiasis is noted with mild inflammatory stranding surrounding gallbladder concerning for acute cholecystitis. Ultrasound is recommended for further evaluation. Minimal bilateral pleural effusions are noted. Sigmoid diverticulosis without inflammation. Small left inguinal hernia is noted which contains a portion of sigmoid colon, but does not result in obstruction. Aortic Atherosclerosis (ICD10-I70.0). Electronically Signed   By: JMarijo ConceptionM.D.   On: 09/09/2022 16:39   CT Angio Chest PE W and/or Wo Contrast  Result Date: 09/09/2022 CLINICAL DATA:  Epigastric pain and shortness of breath. EXAM: CT ANGIOGRAPHY CHEST WITH CONTRAST TECHNIQUE: Multidetector CT imaging of the chest was performed using the standard protocol during bolus administration of intravenous contrast. Multiplanar CT image reconstructions and MIPs were obtained to evaluate the vascular anatomy. RADIATION DOSE REDUCTION: This exam was performed according to the departmental dose-optimization program which includes automated  exposure control, adjustment of the mA and/or kV according to patient size and/or use of iterative reconstruction technique. CONTRAST:  158m OMNIPAQUE IOHEXOL 350 MG/ML SOLN COMPARISON:  None Available. FINDINGS: Cardiovascular: The pulmonary arteries are adequately opacified. There is no evidence of pulmonary embolism. Central pulmonary arteries are of normal caliber. The thoracic aorta is normal in caliber. No significant aortic atherosclerosis. The heart size is normal. No pericardial fluid identified. No visualized calcified coronary artery plaque. Mediastinum/Nodes: There  are some scattered small and mildly enlarged lymph nodes throughout the mediastinum. The largest in the right lower paratracheal region measures approximately 12 mm in short axis. These are nonspecific. No enlarged hilar or axillary lymph nodes identified. Thyroid gland, trachea, and esophagus demonstrate no significant findings. Lungs/Pleura: There is no evidence of pulmonary edema, consolidation, pneumothorax, nodule or pleural fluid. Upper Abdomen: Decreased attenuation of the liver parenchyma is consistent with steatosis. Musculoskeletal: No chest wall abnormality. No acute or significant osseous findings. Review of the MIP images confirms the above findings. IMPRESSION: 1. No evidence of pulmonary embolism or other acute findings in the chest. 2. Scattered small and mildly enlarged lymph nodes throughout the mediastinum which are nonspecific. 3. Hepatic steatosis. Electronically Signed   By: GAletta EdouardM.D.   On: 09/09/2022 16:37   DG Chest 2 View  Result Date: 09/09/2022 CLINICAL DATA:  Shortness of breath. EXAM: CHEST - 2 VIEW COMPARISON:  02/18/2021 FINDINGS: Stable cardiomediastinal contours. Lung volumes are low. Blunting of the costophrenic angles may reflect small pleural effusions. No significant interstitial edema or airspace disease. Visualized osseous structures are unremarkable. IMPRESSION: 1. Low lung volumes. 2. Suspect small bilateral pleural effusions. Electronically Signed   By: TKerby MoorsM.D.   On: 09/09/2022 11:10      Assessment/Plan ?acute cholecystitis Hgb 8.2 (8.9), WBC 4.4 T bili 1.2, Ast/ALT 2.9/13, alk phos 72 Gallstones and borderline GB wall thickening  Patient seen and examined in relevant labs and imaging reviewed.  With gallstones and right upper quadrant pain there is a possibility his symptoms are related to cholecystitis.  We will get HIDA scan to further evaluate however because he also has diffuse pain without relation to p.o. intake which is similar  to past episode of symptomatic bradycardia.  Normal heart rate currently.  If HIDA scan positive then would recommend preoperative evaluation by cardiology.  If a laparoscopic cholecystectomy is indicated will need to remain off anticoagulation. Continue IV antibiotics  History of colon cancer - T1N0 s/p lap R colectomy He also has anemia and history of colon cancer.  CT chest with nonspecific mild to moderately enlarged lymph nodes.  He did not follow-up with GI after his colon resection in 2017.  He needs to have colonoscopy completed.  Pending work-up for acute cholecystitis and anemia remains stable this can be arranged during admission to be completed outpatient.  FEN: Remain n.p.o. for now ID: Zosyn VTE: Okay for chemical prophylaxis, hold Eliquis  Per primary Small left inguinal hernia - not obstructive   I reviewed ED provider notes, hospitalist notes, last 24 h vitals and pain scores, last 48 h intake and output, last 24 h labs and trends, and last 24 h imaging results.   MWinferd Humphrey PHend[facility] CenterSur[facility] 09/10/2022, 8:44 AM Please see Amion for pager number during day hours 7:00am-4:30pm

## 2022-09-10 NOTE — Progress Notes (Addendum)
MD made aware of HIDA Scan results. Surgical team at bedside see new orders placed.

## 2022-09-10 NOTE — TOC Initial Note (Signed)
Transition of Care [facility]) - Initial/Assessment Note    Patient Details  Name: [name] MRN: [id number] Date of Birth: 04-03-1951  [facility] of [facility]) CM/SW Contact:    Servando Snare, LCSW Phone Number: 09/10/2022, 10:13 AM  Clinical Narrative:           TOC following for disposition. Patient from [place]. Uses a walker and requires assistance with ADLs at baseline.          Expected Discharge Plan: Assisted Living Barriers to Discharge: Continued Medical Work up   Patient Goals and CMS Choice        Expected Discharge Plan and Services Expected Discharge Plan: Assisted Living   Discharge Planning Services: NA                                          Prior Living Arrangements/Services   Lives with:: Facility Resident ([facility])   Do you feel safe going back to the place where you live?: Yes               Activities of Daily Living Home Assistive Devices/Equipment: Other (Comment), Walker (specify type), Eyeglasses (left boot, special right shoe, reading glasses) ADL Screening (condition at time of admission) Patient's cognitive ability adequate to safely complete daily activities?: Yes Is the patient deaf or have difficulty hearing?: Yes (slight trouble hearing out of left ear due to bursted ear drum in the past) Does the patient have difficulty seeing, even when wearing glasses/contacts?: No (bilateral cataract eye surgery in 2014) Does the patient have difficulty concentrating, remembering, or making decisions?: No Patient able to express need for assistance with ADLs?: Yes Does the patient have difficulty dressing or bathing?: Yes Independently performs ADLs?: No Communication: Needs assistance Is this a change from baseline?: Pre-admission baseline Dressing (OT): Independent (needs help taking boot off sometimes) Grooming: Independent Feeding: Independent Bathing: Needs assistance (only needs help with legs sometimes) Is  this a change from baseline?: Pre-admission baseline Toileting: Needs assistance Is this a change from baseline?: Pre-admission baseline In/Out Bed: Needs assistance Is this a change from baseline?: Pre-admission baseline Walks in Home: Needs assistance Is this a change from baseline?: Pre-admission baseline Does the patient have difficulty walking or climbing stairs?: Yes Weakness of Legs: Both (has broken in  left foot, lymphdema in both legs and feet, pt states he has no cartiledge in his knees) Weakness of Arms/Hands: None (some nerve damage in fingers on right hand)  Permission Sought/Granted Permission sought to share information with : Facility Art therapist granted to share information with : Yes, Verbal Permission Granted  Share Information with NAME: [facility]           Emotional Assessment Appearance:: Appears stated age Attitude/Demeanor/Rapport: Unable to Assess Affect (typically observed): Unable to Assess   Alcohol / Substance Use: Not Applicable Psych Involvement: No (comment)  Admission diagnosis:  Acute cholecystitis [K81.0] Calculus of gallbladder with acute cholecystitis without obstruction [K80.00] Patient Active Problem List   Diagnosis Date Noted   IDA (iron deficiency anemia) 09/10/2022   CKD (chronic kidney disease) stage 2, GFR 60-89 ml/min 09/09/2022   Chronic anticoagulation 09/09/2022   Personal history of cecal colon cancer 09/09/2022   Anemia 09/09/2022   Venous stasis ulcers- BLE 09/09/2022   Obesity (BMI 30-39.9) 09/09/2022   Acute cholecystitis 09/09/2022   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  09/09/2022   Subacute eczematous & allergic contact dermatitis  09/09/2022   History of COVID-19 09/09/2022   History of homelessness 09/09/2022   Diverticulosis of colon 09/09/2022   Cholelithiasis 09/09/2022   Hepatic steatosis 09/09/2022   Living in assisted living 09/09/2022   Pain in right foot  05/06/2022   Atrial fibrillation with slow ventricular response ([place]) 12/23/2021   Pain of joint of left ankle and foot 02/26/2021   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   Abnormal CT scan, colon    AKI (acute kidney injury) ([place])    Generalized weakness    Abdominal pain    Protein-calorie malnutrition, severe 11/10/2016   Constipation, chronic 11/09/2016   CKD (chronic kidney disease) stage 3, GFR 30-59 ml/min (HCC) 11/09/2016   Hyperbilirubinemia 11/09/2016   Hyponatremia 11/08/2016   RBBB 09/13/2013   Snoring 09/13/2013   Edema 09/07/2013   Diabetes mellitus type 2 in obese ([place]) 03/16/2013   Arthritis 03/16/2013   Essential hypertension, benign 02/23/2013   Morbid obesity ([place]) 02/23/2013   Atrial fibrillation ([place]) 02/23/2013   Hyperlipidemia 02/23/2013   PCP:  Sandi Mariscal, MD Pharmacy:  No Pharmacies Listed    Social Determinants of Health (SDOH) Interventions    Readmission Risk Interventions     No data to display

## 2022-09-10 NOTE — Progress Notes (Signed)
PROGRESS NOTE  [name] EXN:[id number] DOB: 1951/11/05   PCP: Sandi Mariscal, MD  Patient is from: ALF.  DOA: 09/09/2022 LOS: 0  Chief complaints Chief Complaint  Patient presents with   Abdominal Pain   Shortness of Breath     Brief Narrative / Interim history: 71 year old M with PMH of cecal adenocarcinoma status postresection, A-fib on Eliquis, NIDDM-2, anxiety, depression, morbid obesity, chronic constipation, lymphedema and foot fractures presenting with fall days of right upper quadrant pain with associated nausea and dry heaving and admitted for possible acute calculus cholecystitis and AKI.  CT and RUQ [place] raises concern for acute calculus cholecystitis.  However, he has no fever or leukocytosis.  General surgery consulted.  HIDA scan ordered.  Patient is on IV antibiotics.  Subjective: Seen and examined earlier this morning.  Continues to endorse right upper quadrant pain that he describes as pressure.  No radiation to his shoulder or his back.  Also reports difficulty taking deep breath.  Has cough when he tries to take deep breath.  Denies shortness of breath.  Objective: Vitals:   09/10/22 0029 09/10/22 0434 09/10/22 0925 09/10/22 1007  BP: 106/69 (!) 116/59 126/69   Pulse: 74 68 75   Resp: '15 17 17   '$ Temp: 98.8 F (37.1 C) 98.6 F (37 C) 99.1 F (37.3 C)   TempSrc: Oral Oral Oral   SpO2: 94% 91% 90%   Weight:    (!) 138.8 kg  Height:    '6\' 3"'$  (1.905 m)    Examination:  GENERAL: No apparent distress.  Nontoxic. HEENT: MMM.  Vision and hearing grossly intact.  NECK: Supple.  No apparent JVD.  RESP:  No IWOB.  Fair aeration bilaterally. CVS:  RRR. Heart sounds normal.  ABD/GI/GU: BS+. Abd soft.  Tenderness across upper abdomen.  Murphy sign positive. MSK/EXT:  Moves extremities.  Lymphedema bilaterally.  Postop shoe to left foot SKIN: no apparent skin lesion or wound NEURO: Awake, alert and oriented appropriately.  No apparent focal neuro deficit. PSYCH:  Calm. Normal affect.   Procedures:  None  Microbiology summarized: COVID-19 PCR nonreactive.  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  Seems to have positive Murphy sign.  CT and RUQ [place] raises concern for calculus cholecystitis.  However, he has no fever, leukocytosis or abnormal LFT.  Lipase within normal. -General surgery following -Continue IV Zosyn -Follow HIDA scan -In regards to cardiac clearance, patient has significant comorbidity including CHF, A-fib, diabetes and immunosuppression but compensated and stable.  I doubt need for further cardiac evaluation but would involve cardiology if HIDA scan positive and surgical team wants clearance by cardiology.   AKI: Likely from poor p.o.  intake in the setting of nausea and dry heaving and concurrent use of diuretics.  Improving.  CKD ruled out. Recent Labs    12/22/21 0918 12/22/21 1841 12/23/21 0333 09/09/22 1042 09/10/22 0511  BUN 22  --  '17 18 18  '$ CREATININE 1.09 0.90 1.04 1.46* 1.38*  -Continue holding diuretics -Continue monitoring renal function  Chronic diastolic CHF: TTE in 01/2024 with LVEF of 60 to 65%, indeterminate DD,  normal RVSP.  Appears compensated other than chronic lymphedema.  Seems to be on p.o. Lasix 80 mg daily at home -Continue holding diuretics -Closely monitor rest pressure and fluid status while on gentle IV fluid  Controlled NIDDM-2 with hypoglycemia: A1c 5.3%.  Seems to be on glipizide, Januvia and metformin at home. Recent Labs  Lab 09/09/22 1927 09/09/22 2221 09/10/22 0027 09/10/22 0438 09/10/22 0720  GLUCAP 67* 78 71 78 75  -Hold home medications -May discontinue CBG monitoring and SSI once he started taking p.o.  Paroxysmal A-fib: Rate controlled without medication.  On Eliquis for anticoagulation. -Continue holding Eliquis  Venous stasis ulcers/lymphedema- BLE -Continue supportive care   Anemia of chronic disease: Slight drop in Hgb likely dilutional Recent Labs    12/22/21 0918 12/22/21 1841 12/23/21 0333 09/09/22 1042 09/10/22 0511  HGB 10.0* 11.4* 9.6* 8.9* 8.2*  -Check anemia panel in the morning -Continue monitoring  Eczema and contact dermatitis-takes methotrexate every Friday. -Hold methotrexate in the setting of possible infection   Personal history of cecal colon cancer s/p right colectomy in 2017.  Does not look like he had repeat colonoscopy after surgery. -Outpatient follow-up with GI for colonoscopy   BPH with obstruction/lower urinary tract symptoms -Continue home Flomax   Chronic constipation -Bowel regimen -Outpatient follow-up with GI for repeat colonoscopy  Ambulatory dysfunction/physical deconditioning -PT/OT eval   Morbid obesity: Elevated BMI with comorbidity as above. Body mass index is 38.25 kg/m.           DVT prophylaxis:  SCDs Start: 09/09/22 2028 Place TED hose Start: 09/09/22 2028  Code Status: Full code Family Communication: None at bedside Level of care: Med-Surg Status is: Observation The patient will require care spanning > 2 midnights and should be moved to inpatient because: Acute calculus cholecystitis  requiring further evaluation and IV antibiotics   Final disposition: Likely back to ALF Consultants:  General surgery  Sch Meds:  Scheduled Meds:  insulin aspart  0-9 Units Subcutaneous Q4H   lip balm   Topical BID   morphine (PF)        morphine injection  3 mg Intravenous Once   pantoprazole  40 mg Oral Daily   polyethylene glycol  17 g Oral BID   polyethylene glycol  51 g Oral Once   tamsulosin  0.4 mg Oral Daily   Continuous Infusions:  lactated ringers 75 mL/hr at 09/10/22 0908   ondansetron (ZOFRAN) IV     piperacillin-tazobactam (ZOSYN)  IV 3.375 g (09/10/22 0909)   PRN Meds:.acetaminophen **OR** acetaminophen, alum & mag hydroxide-simeth, magic mouthwash, melatonin, menthol-cetylpyridinium, oxyCODONE **OR** morphine injection, morphine (PF), ondansetron (ZOFRAN) IV **OR** ondansetron (ZOFRAN) IV, ondansetron **OR** ondansetron (ZOFRAN) IV, phenol, prochlorperazine  Antimicrobials: Anti-infectives (From admission, onward)    Start     Dose/Rate Route Frequency Ordered Stop   09/10/22 0100  piperacillin-tazobactam (ZOSYN) IVPB 3.375 g        3.375 g 12.5 mL/hr over 240 Minutes Intravenous Every 8 hours 09/09/22 2022     09/09/22 1900  piperacillin-tazobactam (ZOSYN) IVPB 3.375 g  3.375 g 100 mL/hr over 30 Minutes Intravenous  Once 09/09/22 1848 09/09/22 1940        I have personally reviewed the following labs and images: CBC: Recent Labs  Lab 09/09/22 1042 09/10/22 0511  WBC 3.9* 4.4  NEUTROABS  --  3.7  HGB 8.9* 8.2*  HCT 28.3* 26.2*  MCV 105.6* 105.2*  PLT 146* 130*   BMP &GFR Recent Labs  Lab 09/09/22 1042 09/09/22 2043 09/10/22 0511  NA 134*  --  136  K 4.0  --  3.8  CL 102  --  101  CO2 23  --  26  GLUCOSE 67*  --  77  BUN 18  --  18  CREATININE 1.46*  --  1.38*  CALCIUM 8.7*  --  8.3*  MG  --   --  1.9  PHOS  --  3.8  --    Estimated Creatinine Clearance: 73.8 mL/min (A) (by C-G formula based on SCr of 1.38 mg/dL (H)). Liver &  Pancreas: Recent Labs  Lab 09/09/22 1042 09/10/22 0511  AST 20 13*  ALT 8 10  ALKPHOS 70 72  BILITOT 0.9 1.2  PROT 8.0 7.3  ALBUMIN 3.3* 2.9*   Recent Labs  Lab 09/09/22 1042  LIPASE 36   No results for input(s): "AMMONIA" in the last 168 hours. Diabetic: Recent Labs    09/09/22 2043  HGBA1C 5.3   Recent Labs  Lab 09/09/22 1927 09/09/22 2221 09/10/22 0027 09/10/22 0438 09/10/22 0720  GLUCAP 67* 78 71 78 75   Cardiac Enzymes: No results for input(s): "CKTOTAL", "CKMB", "CKMBINDEX", "TROPONINI" in the last 168 hours. No results for input(s): "PROBNP" in the last 8760 hours. Coagulation Profile: Recent Labs  Lab 09/09/22 2043  INR 1.5*   Thyroid Function Tests: No results for input(s): "TSH", "T4TOTAL", "FREET4", "T3FREE", "THYROIDAB" in the last 72 hours. Lipid Profile: No results for input(s): "CHOL", "HDL", "LDLCALC", "TRIG", "CHOLHDL", "LDLDIRECT" in the last 72 hours. Anemia Panel: Recent Labs    09/09/22 2043  VITAMINB12 213  FOLATE 31.1  FERRITIN 33  TIBC 374  IRON 35*  RETICCTPCT 2.0   Urine analysis:    Component Value Date/Time   COLORURINE YELLOW 09/09/2022 1051   APPEARANCEUR CLEAR 09/09/2022 1051   LABSPEC 1.008 09/09/2022 1051   PHURINE 6.0 09/09/2022 1051   GLUCOSEU NEGATIVE 09/09/2022 1051   HGBUR NEGATIVE 09/09/2022 1051   BILIRUBINUR NEGATIVE 09/09/2022 1051   BILIRUBINUR NEG 05/27/2013 1516   KETONESUR NEGATIVE 09/09/2022 1051   PROTEINUR NEGATIVE 09/09/2022 1051   UROBILINOGEN 0.2 05/27/2013 1516   NITRITE NEGATIVE 09/09/2022 1051   LEUKOCYTESUR NEGATIVE 09/09/2022 1051   Sepsis Labs: Invalid input(s): "PROCALCITONIN", "LACTICIDVEN"  Microbiology: Recent Results (from the past 240 hour(s))  SARS Coronavirus 2 by RT PCR (hospital order, performed in [facility] hospital lab) *cepheid single result test* Anterior Nasal Swab     Status: None   Collection Time: 09/09/22  4:30 PM   Specimen: Anterior Nasal Swab  Result  Value Ref Range Status   SARS Coronavirus 2 by RT PCR NEGATIVE NEGATIVE Final    Comment: (NOTE) SARS-CoV-2 target nucleic acids are NOT DETECTED.  The SARS-CoV-2 RNA is generally detectable in upper and lower respiratory specimens during the acute phase of infection. The lowest concentration of SARS-CoV-2 viral copies this assay can detect is 250 copies / mL. A negative result does not preclude SARS-CoV-2 infection and should not be used as the sole basis for treatment or other patient management  decisions.  A negative result may occur with improper specimen collection / handling, submission of specimen other than nasopharyngeal swab, presence of viral mutation(s) within the areas targeted by this assay, and inadequate number of viral copies (<250 copies / mL). A negative result must be combined with clinical observations, patient history, and epidemiological information.  Fact Sheet for Patients:   https://www.patel.info/  Fact Sheet for Healthcare Providers: https://hall.com/  This test is not yet approved or  cleared by the [place] FDA and has been authorized for detection and/or diagnosis of SARS-CoV-2 by FDA under an Emergency Use Authorization (EUA).  This EUA will remain in effect (meaning this test can be used) for the duration of the COVID-19 declaration under Section 564(b)(1) of the Act, 21 U.S.C. section 360bbb-3(b)(1), unless the authorization is terminated or revoked sooner.  Performed at [facility], [address], [place], [place] [address]     Radiology Studies: US Abdomen Limited RUQ (LIVER/GB)  Result Date: 09/09/2022 CLINICAL DATA:  Right upper quadrant pain EXAM: ULTRASOUND ABDOMEN LIMITED RIGHT UPPER QUADRANT COMPARISON:  CT abdomen and pelvis same day FINDINGS: Gallbladder: Multiple gallstones are present measuring up to 1.8 cm. Gallbladder sludge is present. Gallbladder wall is  borderline thickened measuring 3 mm. Common bile duct: Diameter: Not visualized Liver: No focal lesion identified. Increase in parenchymal echogenicity. Portal vein is patent on color Doppler imaging with normal direction of blood flow towards the liver. Other: None. IMPRESSION: 1. Cholelithiasis and gallbladder sludge. Gallbladder wall is borderline thickened measuring 3 mm. Findings are equivocal for acute cholecystitis. 2. Hepatic steatosis. Electronically Signed   By: Ronney Asters M.D.   On: 09/09/2022 18:41   CT ABDOMEN PELVIS W CONTRAST  Result Date: 09/09/2022 CLINICAL DATA:  Acute epigastric abdominal pain. EXAM: CT ABDOMEN AND PELVIS WITH CONTRAST TECHNIQUE: Multidetector CT imaging of the abdomen and pelvis was performed using the standard protocol following bolus administration of intravenous contrast. RADIATION DOSE REDUCTION: This exam was performed according to the departmental dose-optimization program which includes automated exposure control, adjustment of the mA and/or kV according to patient size and/or use of iterative reconstruction technique. CONTRAST:  161m OMNIPAQUE IOHEXOL 350 MG/ML SOLN COMPARISON:  December 22, 2021. FINDINGS: Lower chest: Minimal bilateral pleural effusions are noted with minimal adjacent subsegmental atelectasis. Hepatobiliary: No biliary dilatation is noted. Cholelithiasis is noted with 1.8 cm stone noted in neck of gallbladder. There are now noted mild inflammatory changes around the gallbladder concerning for possible cholecystitis. Liver is unremarkable. Pancreas: Unremarkable. No pancreatic ductal dilatation or surrounding inflammatory changes. Spleen: Normal in size without focal abnormality. Adrenals/Urinary Tract: Adrenal glands appear normal. Parapelvic cysts are seen on the left. No hydronephrosis or renal obstruction is noted. Urinary bladder is unremarkable. Stomach/Bowel: Stomach appears normal. There is no evidence of bowel obstruction or inflammation.  Postsurgical changes are seen involving the right colon. Sigmoid diverticulosis is noted without inflammation. Vascular/Lymphatic: Aortic atherosclerosis. No enlarged abdominal or pelvic lymph nodes. Reproductive: Prostate is unremarkable. Other: Small left inguinal hernia is noted which contains a portion of sigmoid colon, but does not result in obstruction. Musculoskeletal: No acute or significant osseous findings. IMPRESSION: Cholelithiasis is noted with mild inflammatory stranding surrounding gallbladder concerning for acute cholecystitis. Ultrasound is recommended for further evaluation. Minimal bilateral pleural effusions are noted. Sigmoid diverticulosis without inflammation. Small left inguinal hernia is noted which contains a portion of sigmoid colon, but does not result in obstruction. Aortic Atherosclerosis (ICD10-I70.0). Electronically Signed   By: JMarijo Conception  M.D.   On: 09/09/2022 16:39   CT Angio Chest PE W and/or Wo Contrast  Result Date: 09/09/2022 CLINICAL DATA:  Epigastric pain and shortness of breath. EXAM: CT ANGIOGRAPHY CHEST WITH CONTRAST TECHNIQUE: Multidetector CT imaging of the chest was performed using the standard protocol during bolus administration of intravenous contrast. Multiplanar CT image reconstructions and MIPs were obtained to evaluate the vascular anatomy. RADIATION DOSE REDUCTION: This exam was performed according to the departmental dose-optimization program which includes automated exposure control, adjustment of the mA and/or kV according to patient size and/or use of iterative reconstruction technique. CONTRAST:  145m OMNIPAQUE IOHEXOL 350 MG/ML SOLN COMPARISON:  None Available. FINDINGS: Cardiovascular: The pulmonary arteries are adequately opacified. There is no evidence of pulmonary embolism. Central pulmonary arteries are of normal caliber. The thoracic aorta is normal in caliber. No significant aortic atherosclerosis. The heart size is normal. No pericardial  fluid identified. No visualized calcified coronary artery plaque. Mediastinum/Nodes: There are some scattered small and mildly enlarged lymph nodes throughout the mediastinum. The largest in the right lower paratracheal region measures approximately 12 mm in short axis. These are nonspecific. No enlarged hilar or axillary lymph nodes identified. Thyroid gland, trachea, and esophagus demonstrate no significant findings. Lungs/Pleura: There is no evidence of pulmonary edema, consolidation, pneumothorax, nodule or pleural fluid. Upper Abdomen: Decreased attenuation of the liver parenchyma is consistent with steatosis. Musculoskeletal: No chest wall abnormality. No acute or significant osseous findings. Review of the MIP images confirms the above findings. IMPRESSION: 1. No evidence of pulmonary embolism or other acute findings in the chest. 2. Scattered small and mildly enlarged lymph nodes throughout the mediastinum which are nonspecific. 3. Hepatic steatosis. Electronically Signed   By: GAletta EdouardM.D.   On: 09/09/2022 16:37      Kijuan Gallicchio T. GMelbeta If 7PM-7AM, please contact night-coverage [email or web address] 09/10/2022, 11:49 AM

## 2022-09-10 NOTE — Progress Notes (Signed)
Chaplain received a consult that Raydel wished to create Advance Directives.  Chaplain provided education to Xaiden and assisted him with filling it out.  He plans to speak with his friend and sister to see if they will serve as his 64.  Chaplain will return tomorrow to assist with notarization.  [address] [place] Pager, [phone]

## 2022-09-10 NOTE — Anesthesia Preprocedure Evaluation (Signed)
Anesthesia Evaluation  Patient identified by MRN, date of birth, ID band Patient awake    Reviewed: Allergy & Precautions, NPO status , Patient's Chart, lab work & pertinent test results  Airway Mallampati: II  TM Distance: >3 FB Neck ROM: Full    Dental  (+) Edentulous Upper, Edentulous Lower   Pulmonary former smoker,     + decreased breath sounds      Cardiovascular hypertension, +CHF  + dysrhythmias Atrial Fibrillation  Rhythm:Regular Rate:Normal  Echo: 1. Left ventricular ejection fraction, by estimation, is 60 to 65%. The  left ventricle has normal function. The left ventricle has no regional  wall motion abnormalities. There is mild left ventricular hypertrophy.  Left ventricular diastolic parameters  were normal.  2. Right ventricular systolic function is normal. The right ventricular  size is normal. There is moderately elevated pulmonary artery systolic  pressure.  3. Left atrial size was moderately dilated.  4. The mitral valve is abnormal. Trivial mitral valve regurgitation. No  evidence of mitral stenosis. Moderate mitral annular calcification.  5. The aortic valve is tricuspid. There is moderate calcification of the  aortic valve. There is moderate thickening of the aortic valve. Aortic  valve regurgitation is not visualized. Aortic valve sclerosis is present,  with no evidence of aortic valve  stenosis.  6. Aortic dilatation noted. There is severe dilatation of the aortic  root, measuring 46 mm. There is mild dilatation of the ascending aorta,  measuring 39 mm.  7. The inferior vena cava is dilated in size with >50% respiratory  variability, suggesting right atrial pressure of 8 mmHg.    Neuro/Psych    GI/Hepatic Neg liver ROS, PUD, GERD  Medicated,  Endo/Other  diabetes, Type 2, Oral Hypoglycemic Agents  Renal/GU Renal disease     Musculoskeletal  (+) Arthritis ,   Abdominal   Peds   Hematology   Anesthesia Other Findings   Reproductive/Obstetrics                            Anesthesia Physical Anesthesia Plan  ASA: 3  Anesthesia Plan: General   Post-op Pain Management:    Induction: Intravenous  PONV Risk Score and Plan: 3 and Ondansetron, Dexamethasone and Midazolam  Airway Management Planned: Oral ETT  Additional Equipment: None  Intra-op Plan:   Post-operative Plan: Extubation in OR  Informed Consent: I have reviewed the patients History and Physical, chart, labs and discussed the procedure including the risks, benefits and alternatives for the proposed anesthesia with the patient or authorized representative who has indicated his/her understanding and acceptance.     Dental advisory given  Plan Discussed with: CRNA  Anesthesia Plan Comments:        Anesthesia Quick Evaluation

## 2022-09-10 NOTE — Progress Notes (Signed)
Pharmacy Antibiotic Note  [name] is a 71 y.o. male admitted on 09/09/2022 with  intra-abdominal infection .  Pharmacy has been consulted for piperacillin/tazobactam dosing.  Today, 09/10/22 WBC WNL SCr 1.38 CrCl ~ 85 mL/min using last recorded TBW of 125 kg from June 2023. Ht/wt order entered.  Plan: Piperacillin/tazobactam 3.375 g IV q8h No dose adjustments anticipated.  Pharmacy will sign off and monitor peripherally via electronic surveillance software for any changes in renal function or micro data.      Temp (24hrs), Avg:98.4 F (36.9 C), Min:98.1 F (36.7 C), Max:98.8 F (37.1 C)  Recent Labs  Lab 09/09/22 1042 09/10/22 0511  WBC 3.9* 4.4  CREATININE 1.46* 1.38*     CrCl cannot be calculated (Unknown ideal weight.).    Allergies  Allergen Reactions   Other     [name] bandage (self-gripping) - erythema, itching    Antimicrobials this admission: Piperacillin/tazobactam 9/28 >>   Dose adjustments this admission:  Microbiology results:  Peggyann Juba, PharmD, [place]: [phone] 09/10/2022 7:32 AM

## 2022-09-11 ENCOUNTER — Other Ambulatory Visit: Payer: Self-pay

## 2022-09-11 ENCOUNTER — Observation Stay (HOSPITAL_BASED_OUTPATIENT_CLINIC_OR_DEPARTMENT_OTHER): Payer: Medicare HMO | Admitting: Anesthesiology

## 2022-09-11 ENCOUNTER — Encounter (HOSPITAL_COMMUNITY)
Admission: EM | Disposition: A | Discharge status: Other Acute Inpatient Hospital | Payer: Self-pay | Source: Home / Self Care | Attending: Emergency Medicine

## 2022-09-11 ENCOUNTER — Observation Stay (HOSPITAL_COMMUNITY): Payer: Medicare HMO | Admitting: Anesthesiology

## 2022-09-11 DIAGNOSIS — N179 Acute kidney failure, unspecified: Secondary | ICD-10-CM | POA: Diagnosis not present

## 2022-09-11 DIAGNOSIS — N401 Enlarged prostate with lower urinary tract symptoms: Secondary | ICD-10-CM | POA: Diagnosis not present

## 2022-09-11 DIAGNOSIS — E538 Deficiency of other specified B group vitamins: Secondary | ICD-10-CM

## 2022-09-11 DIAGNOSIS — I4891 Unspecified atrial fibrillation: Secondary | ICD-10-CM

## 2022-09-11 DIAGNOSIS — I5032 Chronic diastolic (congestive) heart failure: Secondary | ICD-10-CM | POA: Diagnosis not present

## 2022-09-11 DIAGNOSIS — K8000 Calculus of gallbladder with acute cholecystitis without obstruction: Secondary | ICD-10-CM | POA: Diagnosis not present

## 2022-09-11 DIAGNOSIS — I110 Hypertensive heart disease with heart failure: Secondary | ICD-10-CM

## 2022-09-11 DIAGNOSIS — Z87891 Personal history of nicotine dependence: Secondary | ICD-10-CM

## 2022-09-11 DIAGNOSIS — Z7901 Long term (current) use of anticoagulants: Secondary | ICD-10-CM | POA: Diagnosis not present

## 2022-09-11 HISTORY — PX: CHOLECYSTECTOMY: SHX55

## 2022-09-11 LAB — COMPREHENSIVE METABOLIC PANEL
ALT: 9 U/L (ref 0–44)
AST: 14 U/L — ABNORMAL LOW (ref 15–41)
Albumin: 3 g/dL — ABNORMAL LOW (ref 3.5–5.0)
Alkaline Phosphatase: 71 U/L (ref 38–126)
Anion gap: 7 (ref 5–15)
BUN: 17 mg/dL (ref 8–23)
CO2: 25 mmol/L (ref 22–32)
Calcium: 8.3 mg/dL — ABNORMAL LOW (ref 8.9–10.3)
Chloride: 103 mmol/L (ref 98–111)
Creatinine, Ser: 1.48 mg/dL — ABNORMAL HIGH (ref 0.61–1.24)
GFR, Estimated: 50 mL/min — ABNORMAL LOW (ref >60)
Glucose, Bld: 106 mg/dL — ABNORMAL HIGH (ref 70–99)
Potassium: 4.4 mmol/L (ref 3.5–5.1)
Sodium: 135 mmol/L (ref 135–145)
Total Bilirubin: 1.1 mg/dL (ref 0.3–1.2)
Total Protein: 7.2 g/dL (ref 6.5–8.1)

## 2022-09-11 LAB — CBC
HCT: 25.9 % — ABNORMAL LOW (ref 39.0–52.0)
Hemoglobin: 8 g/dL — ABNORMAL LOW (ref 13.0–17.0)
MCH: 32.9 pg (ref 26.0–34.0)
MCHC: 30.9 g/dL (ref 30.0–36.0)
MCV: 106.6 fL — ABNORMAL HIGH (ref 80.0–100.0)
Platelets: 149 10*3/uL — ABNORMAL LOW (ref 150–400)
RBC: 2.43 MIL/uL — ABNORMAL LOW (ref 4.22–5.81)
RDW: 18.8 % — ABNORMAL HIGH (ref 11.5–15.5)
WBC: 4.8 10*3/uL (ref 4.0–10.5)
nRBC: 0 % (ref 0.0–0.2)

## 2022-09-11 LAB — VITAMIN B12: Vitamin B-12: 153 pg/mL — ABNORMAL LOW (ref 180–914)

## 2022-09-11 LAB — PHOSPHORUS: Phosphorus: 4 mg/dL (ref 2.5–4.6)

## 2022-09-11 LAB — IRON AND TIBC
Iron: 38 ug/dL — ABNORMAL LOW (ref 45–182)
Saturation Ratios: 12 % — ABNORMAL LOW (ref 17.9–39.5)
TIBC: 315 ug/dL (ref 250–450)
UIBC: 277 ug/dL

## 2022-09-11 LAB — RETICULOCYTES
Immature Retic Fract: 18.9 % — ABNORMAL HIGH (ref 2.3–15.9)
RBC.: 2.43 MIL/uL — ABNORMAL LOW (ref 4.22–5.81)
Retic Count, Absolute: 45.2 10*3/uL (ref 19.0–186.0)
Retic Ct Pct: 1.9 % (ref 0.4–3.1)

## 2022-09-11 LAB — MRSA NEXT GEN BY PCR, NASAL: MRSA by PCR Next Gen: NOT DETECTED

## 2022-09-11 LAB — GLUCOSE, CAPILLARY
Glucose-Capillary: 90 mg/dL (ref 70–99)
Glucose-Capillary: 109 mg/dL — ABNORMAL HIGH (ref 70–99)
Glucose-Capillary: 159 mg/dL — ABNORMAL HIGH (ref 70–99)
Glucose-Capillary: 191 mg/dL — ABNORMAL HIGH (ref 70–99)
Glucose-Capillary: 327 mg/dL — ABNORMAL HIGH (ref 70–99)

## 2022-09-11 LAB — FERRITIN: Ferritin: 40 ng/mL (ref 24–336)

## 2022-09-11 LAB — FOLATE: Folate: 23.7 ng/mL (ref >5.9)

## 2022-09-11 LAB — MAGNESIUM: Magnesium: 1.9 mg/dL (ref 1.7–2.4)

## 2022-09-11 SURGERY — LAPAROSCOPIC CHOLECYSTECTOMY WITH INTRAOPERATIVE CHOLANGIOGRAM
Anesthesia: General | Site: Abdomen

## 2022-09-11 MED ORDER — TERBINAFINE HCL 1 % EX CREA
TOPICAL_CREAM | Freq: Two times a day (BID) | CUTANEOUS | Status: DC
  Filled 2022-09-11: qty 12

## 2022-09-11 MED ORDER — CYANOCOBALAMIN 1000 MCG/ML IJ SOLN
1000.0000 ug | Freq: Every day | INTRAMUSCULAR | Status: DC
  Administered 2022-09-11 – 2022-09-13 (×2): 1000 ug via INTRAMUSCULAR
  Filled 2022-09-11 (×4): qty 1

## 2022-09-11 MED ORDER — 0.9 % SODIUM CHLORIDE (POUR BTL) OPTIME
TOPICAL | Status: DC | PRN
  Administered 2022-09-11: 1000 mL

## 2022-09-11 MED ORDER — AMISULPRIDE (ANTIEMETIC) 5 MG/2ML IV SOLN: 10.0000 mg | Freq: Once | INTRAVENOUS | Status: DC | PRN

## 2022-09-11 MED ORDER — BUPIVACAINE HCL (PF) 0.5 % IJ SOLN
INTRAMUSCULAR | Status: DC | PRN
  Administered 2022-09-11: 20 mL

## 2022-09-11 MED ORDER — FENTANYL CITRATE (PF) 100 MCG/2ML IJ SOLN
INTRAMUSCULAR | Status: AC
  Filled 2022-09-11: qty 2

## 2022-09-11 MED ORDER — DEXAMETHASONE SODIUM PHOSPHATE 10 MG/ML IJ SOLN
INTRAMUSCULAR | Status: DC | PRN
  Administered 2022-09-11: 8 mg via INTRAVENOUS

## 2022-09-11 MED ORDER — EPHEDRINE SULFATE-NACL 50-0.9 MG/10ML-% IV SOSY
PREFILLED_SYRINGE | INTRAVENOUS | Status: DC | PRN
  Administered 2022-09-11: 5 mg via INTRAVENOUS

## 2022-09-11 MED ORDER — ACETAMINOPHEN 160 MG/5ML PO SOLN: 325.0000 mg | ORAL | Status: DC | PRN

## 2022-09-11 MED ORDER — PROPOFOL 10 MG/ML IV BOLUS
INTRAVENOUS | Status: AC
  Filled 2022-09-11: qty 20

## 2022-09-11 MED ORDER — PROPOFOL 10 MG/ML IV BOLUS
INTRAVENOUS | Status: DC | PRN
  Administered 2022-09-11: 130 mg via INTRAVENOUS

## 2022-09-11 MED ORDER — LIDOCAINE 2% (20 MG/ML) 5 ML SYRINGE
INTRAMUSCULAR | Status: DC | PRN
  Administered 2022-09-11: 40 mg via INTRAVENOUS

## 2022-09-11 MED ORDER — ACETAMINOPHEN 10 MG/ML IV SOLN
INTRAVENOUS | Status: DC | PRN
  Administered 2022-09-11: 1000 mg via INTRAVENOUS

## 2022-09-11 MED ORDER — BUPIVACAINE HCL (PF) 0.5 % IJ SOLN
INTRAMUSCULAR | Status: AC
  Filled 2022-09-11: qty 30

## 2022-09-11 MED ORDER — ACETAMINOPHEN 10 MG/ML IV SOLN: 1000.0000 mg | Freq: Once | INTRAVENOUS | Status: DC | PRN

## 2022-09-11 MED ORDER — MIDAZOLAM HCL 5 MG/5ML IJ SOLN
INTRAMUSCULAR | Status: DC | PRN
  Administered 2022-09-11: 2 mg via INTRAVENOUS

## 2022-09-11 MED ORDER — SUGAMMADEX SODIUM 500 MG/5ML IV SOLN
INTRAVENOUS | Status: DC | PRN
  Administered 2022-09-11: 300 mg via INTRAVENOUS

## 2022-09-11 MED ORDER — VITAMIN B-12 1000 MCG PO TABS
1000.0000 ug | ORAL_TABLET | Freq: Every day | ORAL | Status: DC
  Filled 2022-09-11: qty 1

## 2022-09-11 MED ORDER — ONDANSETRON HCL 4 MG/2ML IJ SOLN
INTRAMUSCULAR | Status: AC
  Filled 2022-09-11: qty 2

## 2022-09-11 MED ORDER — SUGAMMADEX SODIUM 500 MG/5ML IV SOLN
INTRAVENOUS | Status: AC
  Filled 2022-09-11: qty 5

## 2022-09-11 MED ORDER — ROCURONIUM BROMIDE 10 MG/ML (PF) SYRINGE
PREFILLED_SYRINGE | INTRAVENOUS | Status: AC
  Filled 2022-09-11: qty 10

## 2022-09-11 MED ORDER — FENTANYL CITRATE (PF) 100 MCG/2ML IJ SOLN
INTRAMUSCULAR | Status: DC | PRN
  Administered 2022-09-11 (×2): 50 ug via INTRAVENOUS

## 2022-09-11 MED ORDER — ROCURONIUM BROMIDE 10 MG/ML (PF) SYRINGE
PREFILLED_SYRINGE | INTRAVENOUS | Status: DC | PRN
  Administered 2022-09-11: 20 mg via INTRAVENOUS
  Administered 2022-09-11: 50 mg via INTRAVENOUS

## 2022-09-11 MED ORDER — FENTANYL CITRATE PF 50 MCG/ML IJ SOSY
PREFILLED_SYRINGE | INTRAMUSCULAR | Status: AC
  Filled 2022-09-11: qty 1

## 2022-09-11 MED ORDER — OXYCODONE HCL 5 MG/5ML PO SOLN: 5.0000 mg | Freq: Once | ORAL | Status: AC | PRN

## 2022-09-11 MED ORDER — LACTATED RINGERS IV SOLN: INTRAVENOUS | Status: DC

## 2022-09-11 MED ORDER — ONDANSETRON HCL 4 MG/2ML IJ SOLN
INTRAMUSCULAR | Status: DC | PRN
  Administered 2022-09-11: 4 mg via INTRAVENOUS

## 2022-09-11 MED ORDER — OXYCODONE HCL 5 MG PO TABS
ORAL_TABLET | ORAL | Status: AC
  Filled 2022-09-11: qty 1

## 2022-09-11 MED ORDER — ACETAMINOPHEN 325 MG PO TABS: 325.0000 mg | ORAL_TABLET | ORAL | Status: DC | PRN

## 2022-09-11 MED ORDER — LACTATED RINGERS IR SOLN
Status: DC | PRN
  Administered 2022-09-11: 1000 mL

## 2022-09-11 MED ORDER — ACETAMINOPHEN 10 MG/ML IV SOLN
INTRAVENOUS | Status: AC
  Filled 2022-09-11: qty 100

## 2022-09-11 MED ORDER — FENTANYL CITRATE PF 50 MCG/ML IJ SOSY
25.0000 ug | PREFILLED_SYRINGE | INTRAMUSCULAR | Status: DC | PRN
  Administered 2022-09-11: 25 ug via INTRAVENOUS

## 2022-09-11 MED ORDER — MIDAZOLAM HCL 2 MG/2ML IJ SOLN
INTRAMUSCULAR | Status: AC
  Filled 2022-09-11: qty 2

## 2022-09-11 MED ORDER — DEXAMETHASONE SODIUM PHOSPHATE 10 MG/ML IJ SOLN
INTRAMUSCULAR | Status: AC
  Filled 2022-09-11: qty 1

## 2022-09-11 MED ORDER — PROMETHAZINE HCL 25 MG/ML IJ SOLN: 6.2500 mg | INTRAMUSCULAR | Status: DC | PRN

## 2022-09-11 MED ORDER — OXYCODONE HCL 5 MG PO TABS
5.0000 mg | ORAL_TABLET | Freq: Once | ORAL | Status: AC | PRN
  Administered 2022-09-11: 5 mg via ORAL

## 2022-09-11 MED ORDER — LACTATED RINGERS IV SOLN: INTRAVENOUS | Status: DC | PRN

## 2022-09-11 SURGICAL SUPPLY — 32 items
APPLIER CLIP 5 13 M/L LIGAMAX5 (MISCELLANEOUS) ×1
BAG COUNTER SPONGE SURGICOUNT (BAG) IMPLANT
CABLE HIGH FREQUENCY MONO STRZ (ELECTRODE) ×1 IMPLANT
CHLORAPREP W/TINT 26 (MISCELLANEOUS) ×1 IMPLANT
CLIP APPLIE 5 13 M/L LIGAMAX5 (MISCELLANEOUS) ×1 IMPLANT
COVER MAYO STAND XLG (MISCELLANEOUS) ×1 IMPLANT
DERMABOND ADVANCED .7 DNX12 (GAUZE/BANDAGES/DRESSINGS) ×1 IMPLANT
DRAPE C-ARM 42X120 X-RAY (DRAPES) IMPLANT
ELECT REM PT RETURN 15FT ADLT (MISCELLANEOUS) ×1 IMPLANT
GLOVE BIO SURGEON STRL SZ7.5 (GLOVE) ×1 IMPLANT
GOWN STRL REUS W/ TWL XL LVL3 (GOWN DISPOSABLE) ×2 IMPLANT
GOWN STRL REUS W/TWL XL LVL3 (GOWN DISPOSABLE) ×2
HEMOSTAT SNOW SURGICEL 2X4 (HEMOSTASIS) IMPLANT
HEMOSTAT SURGICEL 4X8 (HEMOSTASIS) IMPLANT
IRRIG SUCT STRYKERFLOW 2 WTIP (MISCELLANEOUS) ×1
IRRIGATION SUCT STRKRFLW 2 WTP (MISCELLANEOUS) ×1 IMPLANT
KIT BASIN OR (CUSTOM PROCEDURE TRAY) ×1 IMPLANT
KIT TURNOVER KIT A (KITS) IMPLANT
PENCIL SMOKE EVACUATOR (MISCELLANEOUS) IMPLANT
SCISSORS LAP 5X35 DISP (ENDOMECHANICALS) ×1 IMPLANT
SET CHOLANGIOGRAPH MIX (MISCELLANEOUS) IMPLANT
SET TUBE SMOKE EVAC HIGH FLOW (TUBING) ×1 IMPLANT
SLEEVE Z-THREAD 5X100MM (TROCAR) ×2 IMPLANT
SPIKE FLUID TRANSFER (MISCELLANEOUS) ×1 IMPLANT
SUT MNCRL AB 4-0 PS2 18 (SUTURE) ×1 IMPLANT
SYS BAG RETRIEVAL 10MM (BASKET) ×1
SYSTEM BAG RETRIEVAL 10MM (BASKET) ×1 IMPLANT
TOWEL OR 17X26 10 PK STRL BLUE (TOWEL DISPOSABLE) ×1 IMPLANT
TOWEL OR NON WOVEN STRL DISP B (DISPOSABLE) ×1 IMPLANT
TRAY LAPAROSCOPIC (CUSTOM PROCEDURE TRAY) ×1 IMPLANT
TROCAR BALLN 12MMX100 BLUNT (TROCAR) ×1 IMPLANT
TROCAR Z-THREAD OPTICAL 5X100M (TROCAR) ×1 IMPLANT

## 2022-09-11 NOTE — Plan of Care (Signed)

## 2022-09-11 NOTE — Op Note (Signed)
LAPAROSCOPIC CHOLECYSTECTOMY  Procedure Note  [name] 09/11/2022   Pre-op Diagnosis: acute cholecystitis with cholelithiasis     Post-op Diagnosis: same  Procedure(s): LAPAROSCOPIC CHOLECYSTECTOMY  Surgeon(s): Erroll Luna, MD Coralie Keens, MD  Anesthesia: General  Staff:  Circulator: Charlcie Cradle, RN Scrub Person: Rickard Rhymes, RN; Gillis Santa, RN  Estimated Blood Loss: less than 100 mL               Specimens: sent to path  Indications: This is a 71 year old gentleman who has had a right partial colectomy who presented with gallstones and acute cholecystitis.  He has been off his anticoagulation for 48 hours the decision has been made to proceed with a laparoscopic cholecystectomy given his tenderness in the right upper quadrant.  Findings: The patient was found to have an acutely inflamed gallbladder.  There was a large stone in the gallbladder neck.  The patient's previous ileocolic anastomosis was adhesed to the gallbladder as well but was able to be separated from the gallbladder without injury.  Procedure: The patient was brought to operating identifies correct patient.  He was placed supine on the operating table and general anesthesia was induced.  His abdomen was then prepped and draped in usual sterile fashion.  Because of previous abdominal surgery and adhesions found at that time the decision was made to enter the abdomen with an Optiview trocar.  I made a small incision in the patient's left upper quadrant with a scalpel.  Using the 5 mm trocar and Optiview camera I then slowly traversed all layers of the abdominal wall under direct vision and entered the abdominal cavity.  Insufflation of the abdomen was begun.  I evaluated the introduction site and saw no evidence of any injury.  The patient had some adhesions of omentum to his midline below the umbilicus.  The upper abdomen was free of adhesions.  At this point I decided to place the  [place] port above the umbilicus through an open incision.  I made a vertical incision with scalpel and then dissected down to the fascia.  The fascia was opened with a #15 blade.  I then gained entrance into the abdominal cavity under direct vision with a Kelly clamp easily.  A 0 Vicryl pursing suture was placed around the fascial opening.  The [facility] port was placed to the opening and insufflation of the abdomen was begun.  I placed a 5 mm trocar in the patient's epigastrium and 2 more in the right upper quadrant all under direct vision.  The patient had significant adhesions of omentum to the liver and gallbladder.  I took these down both bluntly and with the electrocautery.  I could finally then identify the gallbladder and elevated.  It was acutely inflamed.  I did aspirate bile from the gallbladder in order to grasp it.  I then added to the base of the gallbladder.  The patient's previous ileocolic anastomosis was fixed to the side of the gallbladder.  I was able to peel the anastomosis off the gallbladder bluntly and evaluated the bowel itself no evidence of bowel injury.  Because of the adhesions, intra-abdominal fat, and difficulty of the dissection of the base of the gallbladder we placed another 5 mm trocar in the patient's right upper quadrant to facilitate retraction.  I was able to dissect out the cystic artery and achieved a critical window around it.  I clipped it 3 times proximally and once distally and transected.  There was a  large stone impacted in the gallbladder neck.  I was able to finally dissect out the cystic duct proximal to the large stone.  I achieved a critical window around the cystic duct.  I was able to clipped it 3 times proximally and then transected distally.  The gallbladder was and slowly dissected free from the liver bed with electrocautery.  Once the gallbladder was freed from the liver bed we achieved hemostasis with the cautery and a piece of surgical snow.  The gallbladder  was placed in an Endosac and removed through the incision at the umbilicus.  We copiously irrigated the abdomen with saline.  Again, hemostasis peer to be achieved.  I then removed the trocar at the umbilicus and tied the 0 Vicryl suture in place closing the fascial defect.  All other ports were then removed under direct vision and the abdomen was deflated.  All incisions were then anesthetized with Marcaine and closed with 4-0 Monocryl sutures.  Dermabond was then applied.  The patient tolerated the procedure well.  All the counts were correct at the end of the procedure.  The patient was then extubated in the operating room and taken in a stable condition to the recovery room.          Coralie Keens   Date: 09/11/2022  Time: 9:46 AM

## 2022-09-11 NOTE — Progress Notes (Addendum)
OT Cancellation Note  Patient Details Name: [name] MRN: [id number] DOB: November 22, 1951   Cancelled Treatment:    Reason Eval/Treat Not Completed: Patient at procedure or test/ unavailable (Patient is off the unit for surgery.)  Leota Sauers, OTR/L 09/11/2022, 3:22 PM

## 2022-09-11 NOTE — Anesthesia Procedure Notes (Signed)
Procedure Name: Intubation Date/Time: 09/11/2022 8:00 AM  Performed by: [name] Lain, CRNAPre-anesthesia Checklist: Patient identified, Emergency Drugs available, Suction available, Patient being monitored and Timeout performed Patient Re-evaluated:Patient Re-evaluated prior to induction Oxygen Delivery Method: Circle system utilized Preoxygenation: Pre-oxygenation with 100% oxygen Induction Type: IV induction Ventilation: Mask ventilation without difficulty and Oral airway inserted - appropriate to patient size Laryngoscope Size: Mac and 4 Grade View: Grade I Tube type: Oral Tube size: 7.5 mm Number of attempts: 1 Airway Equipment and Method: Stylet Placement Confirmation: ETT inserted through vocal cords under direct vision, positive ETCO2 and breath sounds checked- equal and bilateral Secured at: 21 cm Tube secured with: Tape Dental Injury: Teeth and Oropharynx as per pre-operative assessment

## 2022-09-11 NOTE — Progress Notes (Signed)
Facilitated getting Advanced Directive notarized. Patient has original, with copies, copy also placed in physical chart.

## 2022-09-11 NOTE — Progress Notes (Signed)
PROGRESS NOTE  [name] BTD:[id number] DOB: 23-May-1951   PCP: Sandi Mariscal, MD  Patient is from: ALF.  DOA: 09/09/2022 LOS: 0  Chief complaints Chief Complaint  Patient presents with   Abdominal Pain   Shortness of Breath     Brief Narrative / Interim history: 71 year old M with PMH of cecal adenocarcinoma status postresection, A-fib on Eliquis, NIDDM-2, anxiety, depression, morbid obesity, chronic constipation, lymphedema and foot fractures presenting with fall days of right upper quadrant pain with associated nausea and dry heaving and admitted for possible acute calculus cholecystitis and AKI.  CT and RUQ [place] raises concern for acute calculus cholecystitis.  However, he has no fever or leukocytosis.  Started on IV antibiotics.  General surgery consulted.  HIDA scan positive.  Patient underwent lap chole on 09/11/2022.  Subjective: Seen and examined this afternoon after he returned from lap chole.  No major events overnight of this morning.  No complaints other than some RUQ pain at surgical site.  Still with cough and discomfort in RUQ when he takes deep breaths but no shortness of breath or chest pain.  Objective: Vitals:   09/11/22 1059 09/11/22 1115 09/11/22 1203 09/11/22 1309  BP: (!) 111/57 113/61 113/71 (!) 110/59  Pulse: 62 62 (!) 56 (!) 43  Resp: (!) 0 '18 16 18  '$ Temp:   (!) 97.4 F (36.3 C) (!) 97.5 F (36.4 C)  TempSrc:   Oral Oral  SpO2: 92% 97% 97% 98%  Weight:      Height:        Examination:  GENERAL: No apparent distress.  Nontoxic. HEENT: MMM.  Vision and hearing grossly intact.  NECK: Supple.  No apparent JVD.  RESP:  No IWOB.  Fair aeration bilaterally. CVS:  RRR. Heart sounds normal.  ABD/GI/GU: BS+. Abd soft.  Some tenderness in RUQ. MSK/EXT:  Moves extremities. No apparent deformity. No edema.  SKIN: Some induration between his toes.  Lymphedema. NEURO: Awake and alert. Oriented appropriately.  No apparent focal neuro deficit. PSYCH: Calm.  Normal affect.   Procedures:  None  Microbiology summarized: COVID-19 PCR nonreactive.  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  Seems to have positive Murphy sign.  CT and RUQ [place] raises concern for calculus cholecystitis.  HIDA scan positive. -s/p lap chole on 09/11/2022. -Continue IV Zosyn   AKI: Likely from poor p.o. intake in the setting of nausea and dry heaving and concurrent use of diuretics.  Improving.  CKD ruled out. Recent Labs    12/22/21 0918 12/22/21 1841 12/23/21 0333 09/09/22 1042 09/10/22 0511 09/11/22 0539  BUN 22  --  '17 18 18 17  '$ CREATININE 1.09 0.90 1.04 1.46* 1.38*  1.48*  -Continue holding diuretics -Continue monitoring renal function -Gentle IV fluid  Chronic diastolic CHF: TTE in 06/8241 with LVEF of 60 to 65%, indeterminate DD, normal RVSP.  Appears compensated other than chronic lymphedema.  Seems to be on p.o. Lasix 80 mg daily at home -Continue holding diuretics -Closely monitor rest pressure and fluid status while on gentle IV fluid  Controlled NIDDM-2 with hypoglycemia: A1c 5.3%.  Seems to be on glipizide, Januvia and metformin at home. Recent Labs   Lab 09/10/22 1937 09/10/22 2349 09/11/22 0323 09/11/22 0649 09/11/22 1201  GLUCAP 284* 133* 90 109* 159*  -Hold home medications -May discontinue CBG monitoring and SSI once he started taking p.o.  Paroxysmal A-fib: Rate controlled without medication.  On Eliquis for anticoagulation. -Continue holding Eliquis  Venous stasis ulcers/lymphedema/tinea pedis- BLE -Continue supportive care -Apply terbinafine cream twice daily   Anemia of chronic disease: Slight drop in Hgb likely dilutional.  Anemia panel with some degree of iron deficiency and B12 deficiency. Recent Labs    12/22/21 0918 12/22/21 1841 12/23/21 0333 09/09/22 1042 09/10/22 0511 09/11/22 0539  HGB 10.0* 11.4* 9.6* 8.9* 8.2* 8.0*  -Vitamin B12 injection 1000 mcg daily for 4 days followed by p.o. -IV iron prior to discharge -Continue monitoring  Eczema and contact dermatitis-takes methotrexate every Friday. -Hold methotrexate in the setting of possible infection   Personal history of cecal colon cancer s/p right colectomy in 2017.  Does not look like he had repeat colonoscopy after surgery. -Outpatient follow-up with GI for colonoscopy   BPH with obstruction/lower urinary tract symptoms -Continue home Flomax   Chronic constipation -Bowel regimen -Outpatient follow-up with GI for repeat colonoscopy  Ambulatory dysfunction/physical deconditioning -PT/OT eval   Morbid obesity: Elevated BMI with comorbidity as above. Body mass index is 38.25 kg/m.           DVT prophylaxis:  SCDs Start: 09/09/22 2028 Place TED hose Start: 09/09/22 2028  Code Status: Full code Family Communication: None at bedside Level of care: Med-Surg Status is: Observation The patient will require care spanning > 2 midnights and should be moved to inpatient because: Acute calculus cholecystitis requiring surgical intervention and IV antibiotics   Final disposition: Likely back to ALF Consultants:  General surgery  Sch  Meds:  Scheduled Meds:  cyanocobalamin  1,000 mcg Intramuscular Daily   Followed by   Derrill Memo ON 09/15/2022] vitamin B-12  1,000 mcg Oral Daily   insulin aspart  0-9 Units Subcutaneous Q4H   lip balm   Topical BID   morphine (PF)       pantoprazole  40 mg Oral Daily   polyethylene glycol  17 g Oral BID   polyethylene glycol  51 g Oral Once   tamsulosin  0.4 mg Oral Daily   Continuous Infusions:  ondansetron (ZOFRAN) IV     piperacillin-tazobactam (ZOSYN)  IV 3.375 g (09/11/22 0006)   PRN Meds:.acetaminophen **OR** acetaminophen, alum & mag hydroxide-simeth, magic mouthwash, melatonin, menthol-cetylpyridinium, oxyCODONE **OR** morphine injection, morphine (PF), ondansetron (ZOFRAN) IV **OR** ondansetron (ZOFRAN) IV, ondansetron **OR** ondansetron (ZOFRAN) IV, phenol, prochlorperazine  Antimicrobials: Anti-infectives (From admission, onward)    Start     Dose/Rate Route Frequency Ordered Stop   09/10/22 0100  piperacillin-tazobactam (ZOSYN) IVPB 3.375 g        3.375 g 12.5 mL/hr over 240 Minutes Intravenous Every 8 hours 09/09/22 2022     09/09/22 1900  piperacillin-tazobactam (ZOSYN) IVPB 3.375 g        3.375 g 100 mL/hr  over 30 Minutes Intravenous  Once 09/09/22 1848 09/09/22 1940        I have personally reviewed the following labs and images: CBC: Recent Labs  Lab 09/09/22 1042 09/10/22 0511 09/11/22 0539  WBC 3.9* 4.4 4.8  NEUTROABS  --  3.7  --   HGB 8.9* 8.2* 8.0*  HCT 28.3* 26.2* 25.9*  MCV 105.6* 105.2* 106.6*  PLT 146* 130* 149*   BMP &GFR Recent Labs  Lab 09/09/22 1042 09/09/22 2043 09/10/22 0511 09/11/22 0539  NA 134*  --  136 135  K 4.0  --  3.8 4.4  CL 102  --  101 103  CO2 23  --  26 25  GLUCOSE 67*  --  77 106*  BUN 18  --  18 17  CREATININE 1.46*  --  1.38* 1.48*  CALCIUM 8.7*  --  8.3* 8.3*  MG  --   --  1.9 1.9  PHOS  --  3.8  --  4.0   Estimated Creatinine Clearance: 68.8 mL/min (A) (by C-G formula based on SCr of 1.48 mg/dL  (H)). Liver & Pancreas: Recent Labs  Lab 09/09/22 1042 09/10/22 0511 09/11/22 0539  AST 20 13* 14*  ALT '8 10 9  '$ ALKPHOS 70 72 71  BILITOT 0.9 1.2 1.1  PROT 8.0 7.3 7.2  ALBUMIN 3.3* 2.9* 3.0*   Recent Labs  Lab 09/09/22 1042  LIPASE 36   No results for input(s): "AMMONIA" in the last 168 hours. Diabetic: Recent Labs    09/09/22 2043  HGBA1C 5.3   Recent Labs  Lab 09/10/22 1937 09/10/22 2349 09/11/22 0323 09/11/22 0649 09/11/22 1201  GLUCAP 284* 133* 90 109* 159*   Cardiac Enzymes: No results for input(s): "CKTOTAL", "CKMB", "CKMBINDEX", "TROPONINI" in the last 168 hours. No results for input(s): "PROBNP" in the last 8760 hours. Coagulation Profile: Recent Labs  Lab 09/09/22 2043  INR 1.5*   Thyroid Function Tests: No results for input(s): "TSH", "T4TOTAL", "FREET4", "T3FREE", "THYROIDAB" in the last 72 hours. Lipid Profile: No results for input(s): "CHOL", "HDL", "LDLCALC", "TRIG", "CHOLHDL", "LDLDIRECT" in the last 72 hours. Anemia Panel: Recent Labs    09/09/22 2043 09/11/22 0539  VITAMINB12 213 153*  FOLATE 31.1 23.7  FERRITIN 33 40  TIBC 374 315  IRON 35* 38*  RETICCTPCT 2.0 1.9   Urine analysis:    Component Value Date/Time   COLORURINE YELLOW 09/09/2022 1051   APPEARANCEUR CLEAR 09/09/2022 1051   LABSPEC 1.008 09/09/2022 1051   PHURINE 6.0 09/09/2022 1051   GLUCOSEU NEGATIVE 09/09/2022 1051   HGBUR NEGATIVE 09/09/2022 1051   BILIRUBINUR NEGATIVE 09/09/2022 1051   BILIRUBINUR NEG 05/27/2013 1516   KETONESUR NEGATIVE 09/09/2022 1051   PROTEINUR NEGATIVE 09/09/2022 1051   UROBILINOGEN 0.2 05/27/2013 1516   NITRITE NEGATIVE 09/09/2022 1051   LEUKOCYTESUR NEGATIVE 09/09/2022 1051   Sepsis Labs: Invalid input(s): "PROCALCITONIN", "LACTICIDVEN"  Microbiology: Recent Results (from the past 240 hour(s))  SARS Coronavirus 2 by RT PCR (hospital order, performed in [facility] hospital lab) *cepheid single result test* Anterior Nasal Swab      Status: None   Collection Time: 09/09/22  4:30 PM   Specimen: Anterior Nasal Swab  Result Value Ref Range Status   SARS Coronavirus 2 by RT PCR NEGATIVE NEGATIVE Final    Comment: (NOTE) SARS-CoV-2 target nucleic acids are NOT DETECTED.  The SARS-CoV-2 RNA is generally detectable in upper and lower respiratory specimens during the acute phase of infection. The lowest concentration of SARS-CoV-2  viral copies this assay can detect is 250 copies / mL. A negative result does not preclude SARS-CoV-2 infection and should not be used as the sole basis for treatment or other patient management decisions.  A negative result may occur with improper specimen collection / handling, submission of specimen other than nasopharyngeal swab, presence of viral mutation(s) within the areas targeted by this assay, and inadequate number of viral copies (<250 copies / mL). A negative result must be combined with clinical observations, patient history, and epidemiological information.  Fact Sheet for Patients:   https://www.patel.info/  Fact Sheet for Healthcare Providers: https://hall.com/  This test is not yet approved or  cleared by the [place] FDA and has been authorized for detection and/or diagnosis of SARS-CoV-2 by FDA under an Emergency Use Authorization (EUA).  This EUA will remain in effect (meaning this test can be used) for the duration of the COVID-19 declaration under Section 564(b)(1) of the Act, 21 U.S.C. section 360bbb-3(b)(1), unless the authorization is terminated or revoked sooner.  Performed at [facility], [place] [address], [place], [place] [address]   MRSA Next Gen by PCR, Nasal     Status: None   Collection Time: 09/11/22  1:24 AM   Specimen: Nasal Mucosa; Nasal Swab  Result Value Ref Range Status   MRSA by PCR Next Gen NOT DETECTED NOT DETECTED Final    Comment: (NOTE) The GeneXpert MRSA Assay (FDA approved  for NASAL specimens only), is one component of a comprehensive MRSA colonization surveillance program. It is not intended to diagnose MRSA infection nor to guide or monitor treatment for MRSA infections. Test performance is not FDA approved in patients less than 21 years old. Performed at [facility], [place] [address] [place], [place] [address]     Radiology Studies: ECHOCARDIOGRAM COMPLETE  Result Date: 09/10/2022    ECHOCARDIOGRAM REPORT   Patient Name:   ARDITH LEWMAN Date of Exam: 09/10/2022 Medical Rec #:  [id number]       Height:       75.0 in Accession #:    [id number]      Weight:       306.0 lb Date of Birth:  12/13/1951       BSA:          2.629 m Patient Age:    61 years        BP:           114/95 mmHg Patient Gender: M               HR:           81 bpm. Exam Location:  Inpatient Procedure: 2D Echo, Cardiac Doppler and Color Doppler Indications:    Pre-operative  History:        Patient has prior history of Echocardiogram examinations, most                 recent 12/23/2021. Risk Factors:Diabetes, Dyslipidemia and                 Hypertension.  Sonographer:    Jefferey Pica Referring Phys: [id number] Charlesetta Ivory Keene Gilkey  Sonographer Comments: Image acquisition challenging due to respiratory motion and Image acquisition challenging due to patient body habitus. IMPRESSIONS  1. Left ventricular ejection fraction, by estimation, is 60 to 65%. The left ventricle has normal function. The left ventricle has no regional wall motion abnormalities. There is mild left ventricular hypertrophy. Left ventricular diastolic parameters were normal.  2. Right ventricular systolic function is normal. The right ventricular size is normal. There is moderately elevated pulmonary artery systolic pressure.  3. Left atrial size was moderately dilated.  4. The mitral valve is abnormal. Trivial mitral valve regurgitation. No evidence of mitral stenosis. Moderate mitral annular calcification.  5. The aortic  valve is tricuspid. There is moderate calcification of the aortic valve. There is moderate thickening of the aortic valve. Aortic valve regurgitation is not visualized. Aortic valve sclerosis is present, with no evidence of aortic valve stenosis.  6. Aortic dilatation noted. There is severe dilatation of the aortic root, measuring 46 mm. There is mild dilatation of the ascending aorta, measuring 39 mm.  7. The inferior vena cava is dilated in size with >50% respiratory variability, suggesting right atrial pressure of 8 mmHg. FINDINGS  Left Ventricle: Left ventricular ejection fraction, by estimation, is 60 to 65%. The left ventricle has normal function. The left ventricle has no regional wall motion abnormalities. The left ventricular internal cavity size was normal in size. There is  mild left ventricular hypertrophy. Left ventricular diastolic parameters were normal. Right Ventricle: The right ventricular size is normal. No increase in right ventricular wall thickness. Right ventricular systolic function is normal. There is moderately elevated pulmonary artery systolic pressure. The tricuspid regurgitant velocity is 3.11 m/s, and with an assumed right atrial pressure of 15 mmHg, the estimated right ventricular systolic pressure is 29.7 mmHg. Left Atrium: Left atrial size was moderately dilated. Right Atrium: Right atrial size was normal in size. Pericardium: There is no evidence of pericardial effusion. Mitral Valve: The mitral valve is abnormal. There is mild thickening of the mitral valve leaflet(s). There is mild calcification of the mitral valve leaflet(s). Moderate mitral annular calcification. Trivial mitral valve regurgitation. No evidence of mitral valve stenosis. Tricuspid Valve: The tricuspid valve is normal in structure. Tricuspid valve regurgitation is mild . No evidence of tricuspid stenosis. Aortic Valve: The aortic valve is tricuspid. There is moderate calcification of the aortic valve. There is  moderate thickening of the aortic valve. Aortic valve regurgitation is not visualized. Aortic valve sclerosis is present, with no evidence of aortic valve stenosis. Aortic valve peak gradient measures 6.6 mmHg. Pulmonic Valve: The pulmonic valve was normal in structure. Pulmonic valve regurgitation is not visualized. No evidence of pulmonic stenosis. Aorta: Consider CTA to further define aortic aneurysm Aorta measures 4.7 on prior TTE 12/23/21. Aortic dilatation noted. There is severe dilatation of the aortic root, measuring 46 mm. There is mild dilatation of the ascending aorta, measuring 39 mm. Venous: The inferior vena cava is dilated in size with greater than 50% respiratory variability, suggesting right atrial pressure of 8 mmHg. IAS/Shunts: No atrial level shunt detected by color flow Doppler.  LEFT VENTRICLE PLAX 2D LVIDd:         4.60 cm   Diastology LVIDs:         2.80 cm   LV e' medial:    5.46 cm/s LV PW:         1.30 cm   LV E/e' medial:  23.6 LV IVS:        1.20 cm   LV e' lateral:   8.03 cm/s LVOT diam:     2.60 cm   LV E/e' lateral: 16.1 LV SV:         124 LV SV Index:   47 LVOT Area:     5.31 cm  RIGHT VENTRICLE  IVC RV S prime:     9.45 cm/s  IVC diam: 3.20 cm LEFT ATRIUM             Index        RIGHT ATRIUM           Index LA diam:        4.60 cm 1.75 cm/m   RA Area:     24.40 cm LA Vol (A2C):   79.7 ml 30.31 ml/m  RA Volume:   84.40 ml  32.10 ml/m LA Vol (A4C):   75.9 ml 28.87 ml/m LA Biplane Vol: 83.4 ml 31.72 ml/m  AORTIC VALVE                 PULMONIC VALVE AV Area (Vmax): 4.65 cm     PV Vmax:       0.71 m/s AV Vmax:        128.00 cm/s  PV Peak grad:  2.0 mmHg AV Peak Grad:   6.6 mmHg LVOT Vmax:      112.00 cm/s LVOT Vmean:     75.800 cm/s LVOT VTI:       0.234 m  AORTA Ao Root diam: 4.60 cm Ao Asc diam:  3.90 cm MITRAL VALVE                TRICUSPID VALVE MV Area (PHT): 4.68 cm     TR Peak grad:   38.7 mmHg MV Decel Time: 162 msec     TR Vmax:        311.00 cm/s MV E  velocity: 129.00 cm/s MV A velocity: 41.90 cm/s   SHUNTS MV E/A ratio:  3.08         Systemic VTI:  0.23 m                             Systemic Diam: 2.60 cm Jenkins Rouge MD Electronically signed by Jenkins Rouge MD Signature Date/Time: 09/10/2022/3:29:20 PM    Final       Channon Brougher T. Paris  If 7PM-7AM, please contact night-coverage [email or web address] 09/11/2022, 2:25 PM

## 2022-09-11 NOTE — Anesthesia Postprocedure Evaluation (Signed)
Anesthesia Post Note  Patient: [name]  Procedure(s) Performed: LAPAROSCOPIC CHOLECYSTECTOMY (Abdomen)     Patient location during evaluation: PACU Anesthesia Type: General Level of consciousness: awake and alert Pain management: pain level controlled Vital Signs Assessment: post-procedure vital signs reviewed and stable Respiratory status: spontaneous breathing, nonlabored ventilation, respiratory function stable and patient connected to nasal cannula oxygen Cardiovascular status: blood pressure returned to baseline and stable Postop Assessment: no apparent nausea or vomiting Anesthetic complications: no   No notable events documented.  Last Vitals:  Vitals:   09/11/22 1115 09/11/22 1203  BP: 113/61 113/71  Pulse: 62 (!) 56  Resp: 18 16  Temp:  (!) 36.3 C  SpO2: 97% 97%    Last Pain:  Vitals:   09/11/22 1203  TempSrc: Oral  PainSc:                  Effie Berkshire

## 2022-09-11 NOTE — Progress Notes (Signed)
PT Cancellation Note  Patient Details Name: [name] MRN: [id number] DOB: 27-Aug-1951   Cancelled Treatment:     PT order received but eval deferred this am - pt in OR for lap chole.  Will follow.   Meila Berke 09/11/2022, 7:13 AM

## 2022-09-11 NOTE — Progress Notes (Signed)
Patient ID: [name], male   DOB: December 22, 1950, 71 y.o.   MRN: [id number] Pre Procedure note for inpatients:   [name] has been scheduled for Procedure(s): LAPAROSCOPIC CHOLECYSTECTOMY (N/A) today. The various methods of treatment have been discussed with the patient.  Clinically has acute cholecystitis.  I had a long discussion with the patient regarding the surgery and surgical risks.  Given his previous surgery, he is at high risk for adhesions.  His previous bowel anastomosis is in the area of the gallbladder as well.  I explained the surgical procedure in detail.  We discussed the risks which includes but is not limited to bleeding, infection, injury to surrounding structures, bile duct injury, bile leak, the need to convert to an open procedure, significant cardiopulmonary issues, DVT, and even death.  He understands and wished to proceed with surgery.  He also understands the need for potential transfusions given his hemoglobin if necessary.  The patient has been seen and labs reviewed. There are no changes in the patient's condition to prevent proceeding with the planned procedure today.  Recent labs:  Lab Results  Component Value Date   WBC 4.8 09/11/2022   HGB 8.0 (L) 09/11/2022   HCT 25.9 (L) 09/11/2022   PLT 149 (L) 09/11/2022   GLUCOSE 106 (H) 09/11/2022   CHOL 134 04/26/2014   TRIG 110 04/26/2014   HDL 51 04/26/2014   LDLCALC 61 04/26/2014   ALT 9 09/11/2022   AST 14 (L) 09/11/2022   NA 135 09/11/2022   K 4.4 09/11/2022   CL 103 09/11/2022   CREATININE 1.48 (H) 09/11/2022   BUN 17 09/11/2022   CO2 25 09/11/2022   TSH 2.933 10/12/2013   INR 1.5 (H) 09/09/2022   HGBA1C 5.3 09/09/2022   MICROALBUR 5.26 (H) 12/21/2013    Coralie Keens, MD 09/11/2022 7:27 AM

## 2022-09-11 NOTE — Transfer of Care (Signed)
Immediate Anesthesia Transfer of Care Note  Patient: [name]  Procedure(s) Performed: LAPAROSCOPIC CHOLECYSTECTOMY (Abdomen)  Patient Location: PACU  Anesthesia Type:General  Level of Consciousness: awake, alert , oriented and patient cooperative  Airway & Oxygen Therapy: Patient Spontanous Breathing and Patient connected to face mask oxygen  Post-op Assessment: Report given to RN, Post -op Vital signs reviewed and stable and Patient moving all extremities  Post vital signs: Reviewed and stable  Last Vitals:  Vitals Value Taken Time  BP    Temp    Pulse    Resp    SpO2      Last Pain:  Vitals:   09/11/22 0456  TempSrc: Oral  PainSc:       Patients Stated Pain Goal: 2 (62/86/38 1771)  Complications: No notable events documented.

## 2022-09-12 ENCOUNTER — Encounter (HOSPITAL_COMMUNITY): Payer: Self-pay | Admitting: Surgery

## 2022-09-12 DIAGNOSIS — K8000 Calculus of gallbladder with acute cholecystitis without obstruction: Secondary | ICD-10-CM | POA: Diagnosis not present

## 2022-09-12 DIAGNOSIS — Z7901 Long term (current) use of anticoagulants: Secondary | ICD-10-CM | POA: Diagnosis not present

## 2022-09-12 DIAGNOSIS — N401 Enlarged prostate with lower urinary tract symptoms: Secondary | ICD-10-CM | POA: Diagnosis not present

## 2022-09-12 DIAGNOSIS — N179 Acute kidney failure, unspecified: Secondary | ICD-10-CM | POA: Diagnosis not present

## 2022-09-12 LAB — CBC
HCT: 27 % — ABNORMAL LOW (ref 39.0–52.0)
Hemoglobin: 8.5 g/dL — ABNORMAL LOW (ref 13.0–17.0)
MCH: 33.2 pg (ref 26.0–34.0)
MCHC: 31.5 g/dL (ref 30.0–36.0)
MCV: 105.5 fL — ABNORMAL HIGH (ref 80.0–100.0)
Platelets: 166 10*3/uL (ref 150–400)
RBC: 2.56 MIL/uL — ABNORMAL LOW (ref 4.22–5.81)
RDW: 18.6 % — ABNORMAL HIGH (ref 11.5–15.5)
WBC: 7.1 10*3/uL (ref 4.0–10.5)
nRBC: 0 % (ref 0.0–0.2)

## 2022-09-12 LAB — COMPREHENSIVE METABOLIC PANEL
ALT: 11 U/L (ref 0–44)
AST: 20 U/L (ref 15–41)
Albumin: 2.7 g/dL — ABNORMAL LOW (ref 3.5–5.0)
Alkaline Phosphatase: 68 U/L (ref 38–126)
Anion gap: 8 (ref 5–15)
BUN: 19 mg/dL (ref 8–23)
CO2: 24 mmol/L (ref 22–32)
Calcium: 8.2 mg/dL — ABNORMAL LOW (ref 8.9–10.3)
Chloride: 101 mmol/L (ref 98–111)
Creatinine, Ser: 1.44 mg/dL — ABNORMAL HIGH (ref 0.61–1.24)
GFR, Estimated: 52 mL/min — ABNORMAL LOW (ref >60)
Glucose, Bld: 164 mg/dL — ABNORMAL HIGH (ref 70–99)
Potassium: 4.4 mmol/L (ref 3.5–5.1)
Sodium: 133 mmol/L — ABNORMAL LOW (ref 135–145)
Total Bilirubin: 1.1 mg/dL (ref 0.3–1.2)
Total Protein: 7.4 g/dL (ref 6.5–8.1)

## 2022-09-12 LAB — GLUCOSE, CAPILLARY
Glucose-Capillary: 155 mg/dL — ABNORMAL HIGH (ref 70–99)
Glucose-Capillary: 165 mg/dL — ABNORMAL HIGH (ref 70–99)
Glucose-Capillary: 170 mg/dL — ABNORMAL HIGH (ref 70–99)
Glucose-Capillary: 174 mg/dL — ABNORMAL HIGH (ref 70–99)
Glucose-Capillary: 192 mg/dL — ABNORMAL HIGH (ref 70–99)
Glucose-Capillary: 196 mg/dL — ABNORMAL HIGH (ref 70–99)
Glucose-Capillary: 265 mg/dL — ABNORMAL HIGH (ref 70–99)

## 2022-09-12 LAB — PHOSPHORUS: Phosphorus: 3.6 mg/dL (ref 2.5–4.6)

## 2022-09-12 LAB — MAGNESIUM: Magnesium: 2 mg/dL (ref 1.7–2.4)

## 2022-09-12 MED ORDER — APIXABAN 2.5 MG PO TABS
5.0000 mg | ORAL_TABLET | Freq: Two times a day (BID) | ORAL | Status: DC
  Administered 2022-09-12 – 2022-09-13 (×2): 5 mg via ORAL
  Filled 2022-09-12 (×2): qty 2

## 2022-09-12 MED ORDER — SODIUM CHLORIDE 0.9 % IV SOLN
250.0000 mg | Freq: Every day | INTRAVENOUS | Status: AC
  Administered 2022-09-12 – 2022-09-13 (×2): 250 mg via INTRAVENOUS
  Filled 2022-09-12 (×2): qty 20

## 2022-09-12 NOTE — Discharge Instructions (Signed)
[facility] ______[facility] LAPAROSCOPIC SURGERY: POST OP INSTRUCTIONS Always review your discharge instruction sheet given to you by the facility where your surgery was performed. IF YOU HAVE DISABILITY OR FAMILY LEAVE FORMS, YOU MUST BRING THEM TO THE OFFICE FOR PROCESSING.   DO NOT GIVE THEM TO YOUR DOCTOR.  A prescription for pain medication may be given to you upon discharge.  Take your pain medication as prescribed, if needed.  If narcotic pain medicine is not needed, then you may take acetaminophen (Tylenol) or ibuprofen (Advil) as needed. Take your usually prescribed medications unless otherwise directed. If you need a refill on your pain medication, please contact your pharmacy.  They will contact our office to request authorization. Prescriptions will not be filled after 5pm or on week-ends. You should follow a light diet the first few days after arrival home, such as soup and crackers, etc.  Be sure to include lots of fluids daily. Most patients will experience some swelling and bruising in the area of the incisions.  Ice packs will help.  Swelling and bruising can take several days to resolve.  It is common to experience some constipation if taking pain medication after surgery.  Increasing fluid intake and taking a stool softener (such as Colace) will usually help or prevent this problem from occurring.  A mild laxative (Milk of Magnesia or Miralax) should be taken according to package instructions if there are no bowel movements after 48 hours. Unless discharge instructions indicate otherwise, you may remove your bandages 24-48 hours after surgery, and you may shower at that time.  You may have steri-strips (small skin tapes) in place directly over the incision.  These strips should be left on the skin for 7-10 days.  If your surgeon used skin glue on the incision, you may shower in 24 hours.  The glue will flake off over the next 2-3 weeks.  Any sutures or staples will be  removed at the office during your follow-up visit. ACTIVITIES:  You may resume regular (light) daily activities beginning the next day--such as daily self-care, walking, climbing stairs--gradually increasing activities as tolerated.  You may have sexual intercourse when it is comfortable.  Refrain from any heavy lifting or straining until approved by your doctor. You may drive when you are no longer taking prescription pain medication, you can comfortably wear a seatbelt, and you can safely maneuver your car and apply brakes. RETURN TO WORK:  __________________________________________________________ You should see your doctor in the office for a follow-up appointment approximately 2-3 weeks after your surgery.  Make sure that you call for this appointment within a day or two after you arrive home to insure a convenient appointment time. OTHER INSTRUCTIONS: __________________________________________________________________________________________________________________________ __________________________________________________________________________________________________________________________ WHEN TO CALL YOUR DOCTOR: Fever over 101.0 Inability to urinate Continued bleeding from incision. Increased pain, redness, or drainage from the incision. Increasing abdominal pain  The clinic staff is available to answer your questions during regular business hours.  Please don't hesitate to call and ask to speak to one of the nurses for clinical concerns.  If you have a medical emergency, go to the nearest emergency room or call 911.  A surgeon from [facility] is always on call at the hospital. [address], [address], [place], [place]  [address] ? [address] [address], [place], [place]   [address] [phone] ? [phone] ? FAX [phone] Web site: [email or web address]  

## 2022-09-12 NOTE — Evaluation (Signed)
Physical Therapy Evaluation Patient Details Name: [name] MRN: [id number] DOB: 1951-06-14 Today's Date: 09/12/2022  History of Present Illness  Pt admitted from ALF 09/09/22 with epigastric pain and dx with acute cholecystitis and now s/p Lap Chole 09/11/22.  Pt with hx of DM, a-fib, morbid obesity, and adenocarcinoma of colon s/p resection  Clinical Impression  Pt admitted as above and presenting with functional mobility limitations 2* generalized LE weakness, post op pain, decreased endurance and ambulatory balance deficits.  Pt should progress to dc to prior ALF living arrangement - pt states "they can help me as much as I need them too".     Recommendations for follow up therapy are one component of a multi-disciplinary discharge planning process, led by the attending physician.  Recommendations may be updated based on patient status, additional functional criteria and insurance authorization.  Follow Up Recommendations No PT follow up      Assistance Recommended at Discharge PRN  Patient can return home with the following  A little help with walking and/or transfers;A little help with bathing/dressing/bathroom;Assistance with cooking/housework;Assist for transportation;Help with stairs or ramp for entrance    Equipment Recommendations None recommended by PT  Recommendations for Other Services       Functional Status Assessment Patient has had a recent decline in their functional status and demonstrates the ability to make significant improvements in function in a reasonable and predictable amount of time.     Precautions / Restrictions Precautions Precautions: Fall Restrictions Weight Bearing Restrictions: No      Mobility  Bed Mobility Overal bed mobility: Needs Assistance Bed Mobility: Supine to Sit     Supine to sit: Min assist     General bed mobility comments: Pt declines to attempt rolling out bed following surgery "I have to do it how I do it or it won't  work"    Transfers Overall transfer level: Needs assistance   Transfers: Sit to/from Stand Sit to Stand: Min assist, From elevated surface           General transfer comment: Steady assist with bed significantly elevated    Ambulation/Gait Ambulation/Gait assistance: Min assist, Min guard Gait Distance (Feet): 200 Feet Assistive device: None (3 wheeled RW from home) Gait Pattern/deviations: Step-through pattern, Decreased step length - right, Decreased step length - left, Shuffle, Trunk flexed, Wide base of support Gait velocity: decr     General Gait Details: mild instability but no over LOB;  multiple standing rest breaks to complete task  Stairs            Wheelchair Mobility    Modified Rankin (Stroke Patients Only)       Balance Overall balance assessment: Needs assistance Sitting-balance support: No upper extremity supported, Feet supported Sitting balance-Leahy Scale: Good     Standing balance support: Single extremity supported Standing balance-Leahy Scale: Fair                               Pertinent Vitals/Pain Pain Assessment Pain Assessment: Faces Faces Pain Scale: Hurts little more Pain Location: abdomen Pain Descriptors / Indicators: Grimacing Pain Intervention(s): Limited activity within patient's tolerance, Monitored during session, Patient requesting pain meds-RN notified    Home Living Family/patient expects to be discharged to:: Assisted living                 Home Equipment: Other (comment) (3 wheeled rollator)      Prior Function Prior  Level of Function : Independent/Modified Independent             Mobility Comments: " can walk with my shoes and my rollator or just my shoes but not just my rollator"       Hand Dominance        Extremity/Trunk Assessment   Upper Extremity Assessment Upper Extremity Assessment: Overall WFL for tasks assessed    Lower Extremity Assessment Lower Extremity  Assessment: Generalized weakness;RLE deficits/detail;LLE deficits/detail RLE Deficits / Details: noted edema and requiring post op shoe 2* previous fxs LLE Deficits / Details: noted edema and requiring cam boot 2* previous fxs    Cervical / Trunk Assessment Cervical / Trunk Assessment: Kyphotic  Communication   Communication: No difficulties  Cognition Arousal/Alertness: Awake/alert Behavior During Therapy: WFL for tasks assessed/performed Overall Cognitive Status: Within Functional Limits for tasks assessed                                          General Comments      Exercises     Assessment/Plan    PT Assessment Patient needs continued PT services  PT Problem List Decreased strength;Decreased range of motion;Decreased activity tolerance;Decreased balance;Decreased mobility;Decreased knowledge of use of DME;Pain;Obesity       PT Treatment Interventions DME instruction;Gait training;Functional mobility training;Therapeutic activities;Therapeutic exercise;Patient/family education;Balance training    PT Goals (Current goals can be found in the Care Plan section)  Acute Rehab PT Goals Patient Stated Goal: Return home PT Goal Formulation: With patient Time For Goal Achievement: 09/26/22 Potential to Achieve Goals: Good    Frequency Min 3X/week     Co-evaluation               AM-PAC PT "6 Clicks" Mobility  Outcome Measure Help needed turning from your back to your side while in a flat bed without using bedrails?: A Little Help needed moving from lying on your back to sitting on the side of a flat bed without using bedrails?: A Little Help needed moving to and from a bed to a chair (including a wheelchair)?: A Little Help needed standing up from a chair using your arms (e.g., wheelchair or bedside chair)?: A Little Help needed to walk in hospital room?: A Little Help needed climbing 3-5 steps with a railing? : A Lot 6 Click Score: 17    End of  Session Equipment Utilized During Treatment: Gait belt Activity Tolerance: Patient tolerated treatment well Patient left: Other (comment) (in bathroom) Nurse Communication: Mobility status PT Visit Diagnosis: Difficulty in walking, not elsewhere classified (R26.2)    Time: 1330-1405 PT Time Calculation (min) (ACUTE ONLY): 35 min   Charges:   PT Evaluation $PT Eval Low Complexity: 1 Low PT Treatments $Gait Training: 8-22 mins        [place] Pager [phone] Office [phone]   Rebel Laughridge 09/12/2022, 4:48 PM

## 2022-09-12 NOTE — Progress Notes (Signed)
PROGRESS NOTE  [name]:[id number] DOB: 01/13/1951   PCP: Sandi Mariscal, MD  Patient is from: ALF.  DOA: 09/09/2022 LOS: 0  Chief complaints Chief Complaint  Patient presents with   Abdominal Pain   Shortness of Breath     Brief Narrative / Interim history: 71 year old M with PMH of cecal adenocarcinoma status postresection, A-fib on Eliquis, NIDDM-2, anxiety, depression, morbid obesity, chronic constipation, lymphedema and foot fractures presenting with fall days of right upper quadrant pain with associated nausea and dry heaving and admitted for possible acute calculus cholecystitis and AKI.  CT and RUQ [place] raises concern for acute calculus cholecystitis.  However, he has no fever or leukocytosis.  Started on IV antibiotics.  General surgery consulted.  HIDA scan positive.  Patient underwent lap chole on 09/11/2022.  No postop complication.  Therapy evaluation pending.  Subjective: Seen and examined earlier this morning.  No major events overnight of this morning.  No complaints other than some RUQ pain with cough and deep breathing.  Denies shortness of breath.  Objective: Vitals:   09/11/22 2030 09/12/22 0007 09/12/22 0412 09/12/22 1253  BP: 98/67 110/64 103/69 111/63  Pulse: 66 66 (!) 55 64  Resp: '15 14 16 18  '$ Temp: 98.3 F (36.8 C) 98.2 F (36.8 C) 98.1 F (36.7 C) 98.4 F (36.9 C)  TempSrc: Oral Oral Oral Oral  SpO2: 95% 96% 95% (!) 86%  Weight:      Height:        Examination:  GENERAL: No apparent distress.  Nontoxic. HEENT: MMM.  Vision and hearing grossly intact.  NECK: Supple.  No apparent JVD.  RESP:  No IWOB.  Fair aeration bilaterally. CVS:  RRR. Heart sounds normal.  ABD/GI/GU: BS+. Abd soft.  Appropriately tender.  Wounds clean. MSK/EXT:  Moves extremities.  Lymphedema. SKIN: Skin induration between his toes.  Lymphedema. NEURO: Awake and alert. Oriented appropriately.  No apparent focal neuro deficit. PSYCH: Calm. Normal affect.    Procedures:  None  Microbiology summarized: COVID-19 PCR nonreactive.  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  Seems to have positive Murphy sign.  CT and RUQ [place] raises concern for calculus cholecystitis.  HIDA scan positive. -S/p lap chole on 09/11/2022. 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 Recent Labs    12/22/21 0918 12/22/21 1841 12/23/21 0333 09/09/22 1042 09/10/22 0511 09/11/22 0539 09/12/22 0529  BUN 22  --  '17 18 18 17 19  '$ CREATININE 1.09 0.90 1.04 1.46* 1.38* 1.48* 1.44*  -Continue holding diuretics.  Chronic diastolic CHF: TTE in 07/6577 with LVEF of 60 to 65%, indeterminate DD, normal RVSP.  Appears  compensated other than chronic lymphedema.  Seems to be on p.o. Lasix 80 mg daily at home -Continue holding diuretics -Closely monitor rest pressure and fluid status while on gentle IV fluid  Controlled NIDDM-2 with hypoglycemia: A1c 5.3%.  Seems to be on glipizide, Januvia and metformin at home. Recent Labs  Lab 09/11/22 2025 09/12/22 0004 09/12/22 0409 09/12/22 0717 09/12/22 1207  GLUCAP 327* 265* 170* 165* 192*   -Hold home medications -Change SSI to moderate  Paroxysmal A-fib: Rate controlled without medication.  On Eliquis for anticoagulation. -Resume home Eliquis  Venous stasis ulcers/lymphedema/tinea pedis- BLE -Continue supportive care -Apply terbinafine cream twice daily   Anemia of chronic disease: Slight drop in Hgb likely dilutional.  Anemia panel with some degree of iron deficiency and B12 deficiency. Recent Labs    12/22/21 0918 12/22/21 1841 12/23/21 0333 09/09/22 1042 09/10/22 0511 09/11/22 0539 09/12/22 0529  HGB 10.0* 11.4* 9.6* 8.9* 8.2* 8.0* 8.5*  -Vitamin B12 injection 1000 mcg daily for 4 days followed by p.o. -IV ferric gluconate 250 mg daily on 10/1 and 10/2 -Continue monitoring  Eczema and contact dermatitis-takes methotrexate every Friday. -Resume methotrexate on discharge   Personal history of cecal colon cancer s/p right colectomy in 2017.  Does not look like he had repeat colonoscopy after surgery. -Outpatient follow-up with GI for colonoscopy   BPH with obstruction/lower urinary tract symptoms -Continue home Flomax   Chronic constipation -Bowel regimen -Outpatient follow-up with GI for repeat colonoscopy  Ambulatory dysfunction/physical deconditioning/subacute foot fracture -Reportedly able to bear weight in cam boot. -PT/OT eval pending. -Outpatient follow-up for foot fracture  Hyponatremia -Likely from renal failure.   Morbid obesity: Elevated BMI with comorbidity as above. Body mass index is 38.25 kg/m.           DVT prophylaxis:  SCDs Start: 09/09/22 2028 Place TED hose Start: 09/09/22 2028  Code Status: Full code Family Communication: None at bedside Level of care: Med-Surg Status is: Observation The patient will require care spanning > 2 midnights and should be moved to inpatient because: Acute calculus cholecystitis requiring surgical intervention and IV antibiotics   Final disposition: Pending therapy eval Consultants:   General surgery  Sch Meds:  Scheduled Meds:  cyanocobalamin  1,000 mcg Intramuscular Daily   Followed by   Derrill Memo ON 09/15/2022] vitamin B-12  1,000 mcg Oral Daily   insulin aspart  0-9 Units Subcutaneous Q4H   lip balm   Topical BID   morphine (PF)       pantoprazole  40 mg Oral Daily   polyethylene glycol  17 g Oral BID   polyethylene glycol  51 g Oral Once   tamsulosin  0.4 mg Oral Daily   terbinafine   Topical BID   Continuous Infusions:  ferric gluconate (FERRLECIT) IVPB     ondansetron (ZOFRAN) IV     PRN Meds:.acetaminophen **OR** acetaminophen, alum & mag hydroxide-simeth, magic mouthwash, melatonin, menthol-cetylpyridinium, oxyCODONE **OR** morphine injection, morphine (PF), ondansetron (ZOFRAN) IV **OR** ondansetron (ZOFRAN) IV, ondansetron **OR** ondansetron (ZOFRAN) IV, phenol, prochlorperazine  Antimicrobials: Anti-infectives (From admission, onward)    Start     Dose/Rate Route Frequency Ordered Stop   09/10/22 0100  piperacillin-tazobactam (ZOSYN) IVPB 3.375 g  Status:  Discontinued        3.375 g 12.5 mL/hr over 240 Minutes Intravenous Every 8 hours 09/09/22 2022 09/12/22 1355   09/09/22 1900  piperacillin-tazobactam (ZOSYN) IVPB 3.375 g        3.375 g 100 mL/hr over 30 Minutes Intravenous  Once 09/09/22 1848 09/09/22 1940        I have personally reviewed the following labs and images: CBC: Recent Labs  Lab 09/09/22 1042 09/10/22 0511 09/11/22 0539 09/12/22 0529  WBC 3.9* 4.4 4.8 7.1  NEUTROABS  --  3.7  --   --   HGB 8.9* 8.2* 8.0* 8.5*  HCT 28.3* 26.2* 25.9* 27.0*  MCV 105.6* 105.2* 106.6* 105.5*  PLT 146* 130* 149* 166   BMP &GFR Recent Labs  Lab 09/09/22 1042 09/09/22 2043 09/10/22 0511 09/11/22 0539 09/12/22 0529  NA 134*  --  136 135 133*  K 4.0  --  3.8 4.4 4.4  CL 102  --  101 103 101  CO2 23  --  '26 25 24  '$ GLUCOSE 67*  --  77 106* 164*  BUN 18  --  '18 17 19  '$ CREATININE 1.46*  --  1.38* 1.48* 1.44*  CALCIUM 8.7*  --  8.3*  8.3* 8.2*  MG  --   --  1.9 1.9 2.0  PHOS  --  3.8  --  4.0 3.6   Estimated Creatinine Clearance: 70.7 mL/min (A) (by C-G formula based on SCr of 1.44 mg/dL (H)). Liver & Pancreas: Recent Labs  Lab 09/09/22 1042 09/10/22 0511 09/11/22 0539 09/12/22 0529  AST 20 13* 14* 20  ALT '8 10 9 11  '$ ALKPHOS 70 72 71 68  BILITOT 0.9 1.2 1.1 1.1  PROT 8.0 7.3 7.2 7.4  ALBUMIN 3.3* 2.9* 3.0* 2.7*   Recent Labs  Lab 09/09/22 1042  LIPASE 36   No results for input(s): "AMMONIA" in the last 168 hours. Diabetic: Recent Labs    09/09/22 2043  HGBA1C 5.3   Recent Labs  Lab 09/11/22 2025 09/12/22 0004 09/12/22 0409 09/12/22 0717 09/12/22 1207  GLUCAP 327* 265* 170* 165* 192*   Cardiac Enzymes: No results for input(s): "CKTOTAL", "CKMB", "CKMBINDEX", "TROPONINI" in the last 168 hours. No results for input(s): "PROBNP" in the last 8760 hours. Coagulation Profile: Recent Labs  Lab 09/09/22 2043  INR 1.5*   Thyroid Function Tests: No results for input(s): "TSH", "T4TOTAL", "FREET4", "T3FREE", "THYROIDAB" in the last 72 hours. Lipid Profile: No results for input(s): "CHOL", "HDL", "LDLCALC", "TRIG", "CHOLHDL", "LDLDIRECT" in the last 72 hours. Anemia Panel: Recent Labs    09/09/22 2043 09/11/22 0539  VITAMINB12 213 153*  FOLATE 31.1 23.7  FERRITIN 33 40  TIBC 374 315  IRON 35* 38*  RETICCTPCT 2.0 1.9   Urine analysis:    Component Value Date/Time   COLORURINE YELLOW 09/09/2022 1051   APPEARANCEUR CLEAR 09/09/2022 1051   LABSPEC 1.008 09/09/2022 1051   PHURINE 6.0 09/09/2022 1051   GLUCOSEU NEGATIVE 09/09/2022 1051   HGBUR NEGATIVE 09/09/2022 1051   BILIRUBINUR NEGATIVE 09/09/2022 1051   BILIRUBINUR NEG 05/27/2013 1516   KETONESUR NEGATIVE 09/09/2022 1051   PROTEINUR NEGATIVE 09/09/2022 1051   UROBILINOGEN 0.2 05/27/2013 1516   NITRITE NEGATIVE 09/09/2022 1051   LEUKOCYTESUR NEGATIVE 09/09/2022 1051   Sepsis Labs: Invalid input(s): "PROCALCITONIN",  "LACTICIDVEN"  Microbiology: Recent Results (from the past 240 hour(s))  SARS Coronavirus 2 by RT PCR (hospital order, performed in [facility] hospital lab) *cepheid single result test* Anterior Nasal Swab     Status: None   Collection Time: 09/09/22  4:30 PM   Specimen: Anterior Nasal Swab  Result Value Ref Range Status   SARS Coronavirus 2 by RT PCR NEGATIVE NEGATIVE Final    Comment: (NOTE) SARS-CoV-2 target nucleic acids are NOT  DETECTED.  The SARS-CoV-2 RNA is generally detectable in upper and lower respiratory specimens during the acute phase of infection. The lowest concentration of SARS-CoV-2 viral copies this assay can detect is 250 copies / mL. A negative result does not preclude SARS-CoV-2 infection and should not be used as the sole basis for treatment or other patient management decisions.  A negative result may occur with improper specimen collection / handling, submission of specimen other than nasopharyngeal swab, presence of viral mutation(s) within the areas targeted by this assay, and inadequate number of viral copies (<250 copies / mL). A negative result must be combined with clinical observations, patient history, and epidemiological information.  Fact Sheet for Patients:   https://www.patel.info/  Fact Sheet for Healthcare Providers: https://hall.com/  This test is not yet approved or  cleared by the [place] FDA and has been authorized for detection and/or diagnosis of SARS-CoV-2 by FDA under an Emergency Use Authorization (EUA).  This EUA will remain in effect (meaning this test can be used) for the duration of the COVID-19 declaration under Section 564(b)(1) of the Act, 21 U.S.C. section 360bbb-3(b)(1), unless the authorization is terminated or revoked sooner.  Performed at [facility], [place] [address] [place], [place] [address]   MRSA Next Gen by PCR, Nasal     Status: None    Collection Time: 09/11/22  1:24 AM   Specimen: Nasal Mucosa; Nasal Swab  Result Value Ref Range Status   MRSA by PCR Next Gen NOT DETECTED NOT DETECTED Final    Comment: (NOTE) The GeneXpert MRSA Assay (FDA approved for NASAL specimens only), is one component of a comprehensive MRSA colonization surveillance program. It is not intended to diagnose MRSA infection nor to guide or monitor treatment for MRSA infections. Test performance is not FDA approved in patients less than 76 years old. Performed at [facility], [place] [address], [place], [place] [address]     Radiology Studies: No results found.    Solita Macadam T. King  If 7PM-7AM, please contact night-coverage [email or web address] 09/12/2022, 1:55 PM

## 2022-09-12 NOTE — Progress Notes (Signed)
1 Day Post-Op   Subjective/Chief Complaint: Patient sore but doing okay   Objective: Vital signs in last 24 hours: Temp:  [97.1 F (36.2 C)-98.3 F (36.8 C)] 98.1 F (36.7 C) (10/01 0412) Pulse Rate:  [43-66] 55 (10/01 0412) Resp:  [0-20] 16 (10/01 0412) BP: (98-137)/(57-78) 103/69 (10/01 0412) SpO2:  [90 %-98 %] 95 % (10/01 0412) Last BM Date : 09/05/22  Intake/Output from previous day: 09/30 0701 - 10/01 0700 In: 2360.3 [P.O.:520; I.V.:1651.9; IV Piggyback:188.4] Out: 1135 [Urine:1100; Blood:35] Intake/Output this shift: Total I/O In: 26.9 [IV Piggyback:26.9] Out: 0   Abdomen soft nontender.  Port sites clean dry intact.  Lab Results:  Recent Labs    09/11/22 0539 09/12/22 0529  WBC 4.8 7.1  HGB 8.0* 8.5*  HCT 25.9* 27.0*  PLT 149* 166   BMET Recent Labs    09/11/22 0539 09/12/22 0529  NA 135 133*  K 4.4 4.4  CL 103 101  CO2 25 24  GLUCOSE 106* 164*  BUN 17 19  CREATININE 1.48* 1.44*  CALCIUM 8.3* 8.2*   PT/INR Recent Labs    09/09/22 2043  LABPROT 17.9*  INR 1.5*   ABG No results for input(s): "PHART", "HCO3" in the last 72 hours.  Invalid input(s): "PCO2", "PO2"  Studies/Results: ECHOCARDIOGRAM COMPLETE  Result Date: 09/10/2022    ECHOCARDIOGRAM REPORT   Patient Name:   [name] Date of Exam: 09/10/2022 Medical Rec #:  [id number]       Height:       75.0 in Accession #:    [id number]      Weight:       306.0 lb Date of Birth:  [birth year]       BSA:          2.629 m Patient Age:    71 years        BP:           114/95 mmHg Patient Gender: M               HR:           81 bpm. Exam Location:  Inpatient Procedure: 2D Echo, Cardiac Doppler and Color Doppler Indications:    Pre-operative  History:        Patient has prior history of Echocardiogram examinations, most                 recent 12/23/2021. Risk Factors:Diabetes, Dyslipidemia and                 Hypertension.  Sonographer:    Jefferey Pica Referring Phys: [id number] Charlesetta Ivory GONFA   Sonographer Comments: Image acquisition challenging due to respiratory motion and Image acquisition challenging due to patient body habitus. IMPRESSIONS  1. Left ventricular ejection fraction, by estimation, is 60 to 65%. The left ventricle has normal function. The left ventricle has no regional wall motion abnormalities. There is mild left ventricular hypertrophy. Left ventricular diastolic parameters were normal.  2. Right ventricular systolic function is normal. The right ventricular size is normal. There is moderately elevated pulmonary artery systolic pressure.  3. Left atrial size was moderately dilated.  4. The mitral valve is abnormal. Trivial mitral valve regurgitation. No evidence of mitral stenosis. Moderate mitral annular calcification.  5. The aortic valve is tricuspid. There is moderate calcification of the aortic valve. There is moderate thickening of the aortic valve. Aortic valve regurgitation is not visualized. Aortic valve sclerosis is present, with no evidence of aortic valve  stenosis.  6. Aortic dilatation noted. There is severe dilatation of the aortic root, measuring 46 mm. There is mild dilatation of the ascending aorta, measuring 39 mm.  7. The inferior vena cava is dilated in size with >50% respiratory variability, suggesting right atrial pressure of 8 mmHg. FINDINGS  Left Ventricle: Left ventricular ejection fraction, by estimation, is 60 to 65%. The left ventricle has normal function. The left ventricle has no regional wall motion abnormalities. The left ventricular internal cavity size was normal in size. There is  mild left ventricular hypertrophy. Left ventricular diastolic parameters were normal. Right Ventricle: The right ventricular size is normal. No increase in right ventricular wall thickness. Right ventricular systolic function is normal. There is moderately elevated pulmonary artery systolic pressure. The tricuspid regurgitant velocity is 3.11 m/s, and with an assumed right  atrial pressure of 15 mmHg, the estimated right ventricular systolic pressure is 14.4 mmHg. Left Atrium: Left atrial size was moderately dilated. Right Atrium: Right atrial size was normal in size. Pericardium: There is no evidence of pericardial effusion. Mitral Valve: The mitral valve is abnormal. There is mild thickening of the mitral valve leaflet(s). There is mild calcification of the mitral valve leaflet(s). Moderate mitral annular calcification. Trivial mitral valve regurgitation. No evidence of mitral valve stenosis. Tricuspid Valve: The tricuspid valve is normal in structure. Tricuspid valve regurgitation is mild . No evidence of tricuspid stenosis. Aortic Valve: The aortic valve is tricuspid. There is moderate calcification of the aortic valve. There is moderate thickening of the aortic valve. Aortic valve regurgitation is not visualized. Aortic valve sclerosis is present, with no evidence of aortic valve stenosis. Aortic valve peak gradient measures 6.6 mmHg. Pulmonic Valve: The pulmonic valve was normal in structure. Pulmonic valve regurgitation is not visualized. No evidence of pulmonic stenosis. Aorta: Consider CTA to further define aortic aneurysm Aorta measures 4.7 on prior TTE 12/23/21. Aortic dilatation noted. There is severe dilatation of the aortic root, measuring 46 mm. There is mild dilatation of the ascending aorta, measuring 39 mm. Venous: The inferior vena cava is dilated in size with greater than 50% respiratory variability, suggesting right atrial pressure of 8 mmHg. IAS/Shunts: No atrial level shunt detected by color flow Doppler.  LEFT VENTRICLE PLAX 2D LVIDd:         4.60 cm   Diastology LVIDs:         2.80 cm   LV e' medial:    5.46 cm/s LV PW:         1.30 cm   LV E/e' medial:  23.6 LV IVS:        1.20 cm   LV e' lateral:   8.03 cm/s LVOT diam:     2.60 cm   LV E/e' lateral: 16.1 LV SV:         124 LV SV Index:   47 LVOT Area:     5.31 cm  RIGHT VENTRICLE            IVC RV S prime:      9.45 cm/s  IVC diam: 3.20 cm LEFT ATRIUM             Index        RIGHT ATRIUM           Index LA diam:        4.60 cm 1.75 cm/m   RA Area:     24.40 cm LA Vol (A2C):   79.7 ml 30.31 ml/m  RA Volume:  84.40 ml  32.10 ml/m LA Vol (A4C):   75.9 ml 28.87 ml/m LA Biplane Vol: 83.4 ml 31.72 ml/m  AORTIC VALVE                 PULMONIC VALVE AV Area (Vmax): 4.65 cm     PV Vmax:       0.71 m/s AV Vmax:        128.00 cm/s  PV Peak grad:  2.0 mmHg AV Peak Grad:   6.6 mmHg LVOT Vmax:      112.00 cm/s LVOT Vmean:     75.800 cm/s LVOT VTI:       0.234 m  AORTA Ao Root diam: 4.60 cm Ao Asc diam:  3.90 cm MITRAL VALVE                TRICUSPID VALVE MV Area (PHT): 4.68 cm     TR Peak grad:   38.7 mmHg MV Decel Time: 162 msec     TR Vmax:        311.00 cm/s MV E velocity: 129.00 cm/s MV A velocity: 41.90 cm/s   SHUNTS MV E/A ratio:  3.08         Systemic VTI:  0.23 m                             Systemic Diam: 2.60 cm Jenkins Rouge MD Electronically signed by Jenkins Rouge MD Signature Date/Time: 09/10/2022/3:29:20 PM    Final    NM Hepatobiliary Liver Func  Result Date: 09/10/2022 CLINICAL DATA:  Cholelithiasis.  Epigastric pain. EXAM: NUCLEAR MEDICINE HEPATOBILIARY IMAGING TECHNIQUE: Sequential images of the abdomen were obtained out to 60 minutes following intravenous administration of radiopharmaceutical. RADIOPHARMACEUTICALS:  5.5 mCi Tc-63m Choletec IV COMPARISON:  Ultrasound and CT 09/09/2022 FINDINGS: Prompt clearance of radiotracer from blood pool and homogeneous uptake in liver. Counts are evident in the common bile duct and small bowel by 20 minutes. Gallbladder fails to fill at 45 minutes. 3 mg IV morphine was administered to augment filling of the gallbladder. Gallbladder failed to fill after morphine augmentation. IMPRESSION: Non filling of the gallbladder is most consistent with cystic duct obstruction (acute cholecystitis). These results will be called to the ordering clinician or representative by the  Radiologist Assistant, and communication documented in the PACS or [employer] Electronically Signed   By: SSuzy BouchardM.D.   On: 09/10/2022 13:02    Anti-infectives: Anti-infectives (From admission, onward)    Start     Dose/Rate Route Frequency Ordered Stop   09/10/22 0100  piperacillin-tazobactam (ZOSYN) IVPB 3.375 g        3.375 g 12.5 mL/hr over 240 Minutes Intravenous Every 8 hours 09/09/22 2022     09/09/22 1900  piperacillin-tazobactam (ZOSYN) IVPB 3.375 g        3.375 g 100 mL/hr over 30 Minutes Intravenous  Once 09/09/22 1848 09/09/22 1940       Assessment/Plan: s/p Procedure(s): LAPAROSCOPIC CHOLECYSTECTOMY (N/A) Stable for discharge from surgery standpoint when patient and medical service decide.  Follow-up information put in the chart.  Discharge instructions will be in the chart.  LOS: 0 days    TTurner DanielsMD 09/12/2022

## 2022-09-13 DIAGNOSIS — K8000 Calculus of gallbladder with acute cholecystitis without obstruction: Secondary | ICD-10-CM | POA: Diagnosis not present

## 2022-09-13 DIAGNOSIS — R112 Nausea with vomiting, unspecified: Secondary | ICD-10-CM

## 2022-09-13 DIAGNOSIS — E669 Obesity, unspecified: Secondary | ICD-10-CM

## 2022-09-13 DIAGNOSIS — D509 Iron deficiency anemia, unspecified: Secondary | ICD-10-CM

## 2022-09-13 DIAGNOSIS — Z87442 Personal history of urinary calculi: Secondary | ICD-10-CM

## 2022-09-13 DIAGNOSIS — K760 Fatty (change of) liver, not elsewhere classified: Secondary | ICD-10-CM

## 2022-09-13 DIAGNOSIS — Z85038 Personal history of other malignant neoplasm of large intestine: Secondary | ICD-10-CM

## 2022-09-13 DIAGNOSIS — I480 Paroxysmal atrial fibrillation: Secondary | ICD-10-CM | POA: Diagnosis not present

## 2022-09-13 DIAGNOSIS — E1169 Type 2 diabetes mellitus with other specified complication: Secondary | ICD-10-CM

## 2022-09-13 DIAGNOSIS — Z7901 Long term (current) use of anticoagulants: Secondary | ICD-10-CM | POA: Diagnosis not present

## 2022-09-13 DIAGNOSIS — K5909 Other constipation: Secondary | ICD-10-CM

## 2022-09-13 DIAGNOSIS — Z789 Other specified health status: Secondary | ICD-10-CM

## 2022-09-13 DIAGNOSIS — N401 Enlarged prostate with lower urinary tract symptoms: Secondary | ICD-10-CM | POA: Diagnosis not present

## 2022-09-13 DIAGNOSIS — K626 Ulcer of anus and rectum: Secondary | ICD-10-CM

## 2022-09-13 LAB — RENAL FUNCTION PANEL
Albumin: 2.6 g/dL — ABNORMAL LOW (ref 3.5–5.0)
Anion gap: 7 (ref 5–15)
BUN: 20 mg/dL (ref 8–23)
CO2: 25 mmol/L (ref 22–32)
Calcium: 7.7 mg/dL — ABNORMAL LOW (ref 8.9–10.3)
Chloride: 103 mmol/L (ref 98–111)
Creatinine, Ser: 1.38 mg/dL — ABNORMAL HIGH (ref 0.61–1.24)
GFR, Estimated: 55 mL/min — ABNORMAL LOW (ref >60)
Glucose, Bld: 130 mg/dL — ABNORMAL HIGH (ref 70–99)
Phosphorus: 2.5 mg/dL (ref 2.5–4.6)
Potassium: 4 mmol/L (ref 3.5–5.1)
Sodium: 135 mmol/L (ref 135–145)

## 2022-09-13 LAB — HEMOGLOBIN AND HEMATOCRIT, BLOOD
HCT: 25 % — ABNORMAL LOW (ref 39.0–52.0)
Hemoglobin: 7.8 g/dL — ABNORMAL LOW (ref 13.0–17.0)

## 2022-09-13 LAB — GLUCOSE, CAPILLARY
Glucose-Capillary: 131 mg/dL — ABNORMAL HIGH (ref 70–99)
Glucose-Capillary: 112 mg/dL — ABNORMAL HIGH (ref 70–99)
Glucose-Capillary: 132 mg/dL — ABNORMAL HIGH (ref 70–99)

## 2022-09-13 LAB — MAGNESIUM: Magnesium: 2 mg/dL (ref 1.7–2.4)

## 2022-09-13 MED ORDER — TERBINAFINE HCL 1 % EX CREA: TOPICAL_CREAM | Freq: Two times a day (BID) | CUTANEOUS | 0 refills | Status: DC

## 2022-09-13 MED ORDER — OXYCODONE HCL 5 MG PO TABS: 5.0000 mg | ORAL_TABLET | Freq: Four times a day (QID) | ORAL | 0 refills | Status: DC | PRN

## 2022-09-13 MED ORDER — SENNOSIDES-DOCUSATE SODIUM 8.6-50 MG PO TABS: 1.0000 | ORAL_TABLET | Freq: Two times a day (BID) | ORAL | 0 refills | Status: DC | PRN

## 2022-09-13 MED ORDER — CYANOCOBALAMIN 1000 MCG PO TABS: 1000.0000 ug | ORAL_TABLET | Freq: Every day | ORAL | 1 refills | Status: DC

## 2022-09-13 NOTE — TOC Transition Note (Addendum)
Transition of Care [facility]) - CM/SW Discharge Note   Patient Details  Name: [name] MRN: [id number] Date of Birth: Apr 22, 1951  [facility]) CM/SW Contact:  Ross Ludwig, LCSW Phone Number: 09/13/2022, 1:38 PM   Clinical Narrative:     Patient is from [place].  CSW spoke to [employer] they can accept patient back today.  CSW asked if he will need transportation back home, per ALF, they requested EMS transport.  CSW informed them that patient does not have any medical necessity to use EMS.  ALF spoke to their transportation department and they will pick patient up around 3pm at hospital main entrance.  CSW notified bedside nurse and attending physician.  Patient's DC summary and FL2 was faxed to ALF, and it was received at 12:49pm.  CSW to sign off, please reconsult if other social work needs arise.  Nurse to call report to [phone].  Final next level of care: Assisted Living Barriers to Discharge: Barriers Resolved   Patient Goals and CMS Choice Patient states their goals for this hospitalization and ongoing recovery are:: To return back to his [place] ALF CMS Medicare.gov Compare Post Acute Care list provided to:: Patient Choice offered to / list presented to : Patient  Discharge Placement                       Discharge Plan and Services   Discharge Planning Services: NA                                 Social Determinants of Health (SDOH) Interventions     Readmission Risk Interventions     No data to display

## 2022-09-13 NOTE — Evaluation (Signed)
Occupational Therapy Evaluation Patient Details Name: [name] MRN: [id number] DOB: 15-Aug-1951 Today's Date: 09/13/2022   History of Present Illness Pt admitted from ALF 09/09/22 with epigastric pain and dx with acute cholecystitis and now s/p Lap Chole 09/11/22.  Pt with hx of DM, a-fib, morbid obesity, and adenocarcinoma of colon s/p resection   Clinical Impression   Pt admitted with the above diagnosis and has the deficits listed below. Pt would benefit from cont OT in the hospital to increase independence level with adls to mod I level so he can d/c back to ALF not requiring too much assist from staff. Do not feel pt will need further OT in ALF.       Recommendations for follow up therapy are one component of a multi-disciplinary discharge planning process, led by the attending physician.  Recommendations may be updated based on patient status, additional functional criteria and insurance authorization.   Follow Up Recommendations  No OT follow up    Assistance Recommended at Discharge Set up Supervision/Assistance  Patient can return home with the following A little help with walking and/or transfers;A little help with bathing/dressing/bathroom;Assistance with cooking/housework;Direct supervision/assist for medications management;Help with stairs or ramp for entrance    Functional Status Assessment  Patient has had a recent decline in their functional status and demonstrates the ability to make significant improvements in function in a reasonable and predictable amount of time.  Equipment Recommendations  None recommended by OT    Recommendations for Other Services       Precautions / Restrictions Precautions Precautions: Fall Precaution Comments: last fall in 2016 Restrictions Weight Bearing Restrictions: No      Mobility Bed Mobility Overal bed mobility: Needs Assistance Bed Mobility: Supine to Sit     Supine to sit: Supervision, HOB elevated     General bed  mobility comments: Pt very regimented on how he does things and gets out of bed    Transfers Overall transfer level: Needs assistance Equipment used:  (3 wheel rollator) Transfers: Sit to/from Stand Sit to Stand: Supervision, From elevated surface           General transfer comment: Steady assist with bed significantly elevated      Balance Overall balance assessment: Needs assistance Sitting-balance support: No upper extremity supported, Feet supported Sitting balance-Leahy Scale: Good     Standing balance support: Bilateral upper extremity supported, Reliant on assistive device for balance Standing balance-Leahy Scale: Fair Standing balance comment: does well with rollatro                           ADL either performed or assessed with clinical judgement   ADL Overall ADL's : Needs assistance/impaired Eating/Feeding: Independent;Sitting   Grooming: Wash/dry hands;Wash/dry face;Oral care;Supervision/safety;Standing   Upper Body Bathing: Set up;Sitting   Lower Body Bathing: Minimal assistance;Sit to/from stand;Cueing for compensatory techniques Lower Body Bathing Details (indicate cue type and reason): pt bathes sitting on toilet in assisted living. Does not get in shower Upper Body Dressing : Set up;Sitting   Lower Body Dressing: Minimal assistance;Sit to/from stand;Cueing for compensatory techniques   Toilet Transfer: Min guard;Ambulation;Comfort height toilet;Grab bars;Rolling walker (2 wheels) Toilet Transfer Details (indicate cue type and reason): Pt walked to bathroom with rollator Toileting- Clothing Manipulation and Hygiene: Minimal assistance;Sit to/from stand;Cueing for compensatory techniques     Tub/Shower Transfer Details (indicate cue type and reason): sponge bathes only Functional mobility during ADLs: Min guard (3 wheel rollatr) General  ADL Comments: Pt doing very well with adls. Mildly limited due to swelling in abdomen and mild pain only  when moving.     Vision Baseline Vision/History: 1 Wears glasses Ability to See in Adequate Light: 0 Adequate Patient Visual Report: No change from baseline Vision Assessment?: No apparent visual deficits     Perception     Praxis      Pertinent Vitals/Pain Pain Assessment Pain Assessment: No/denies pain     Hand Dominance Right   Extremity/Trunk Assessment Upper Extremity Assessment Upper Extremity Assessment: Overall WFL for tasks assessed   Lower Extremity Assessment Lower Extremity Assessment: Defer to PT evaluation RLE Deficits / Details: noted edema and requiring post op shoe 2* previous fxs LLE Deficits / Details: noted edema and requiring cam boot 2* previous fxs   Cervical / Trunk Assessment Cervical / Trunk Assessment: Kyphotic   Communication Communication Communication: No difficulties   Cognition Arousal/Alertness: Awake/alert Behavior During Therapy: WFL for tasks assessed/performed Overall Cognitive Status: Within Functional Limits for tasks assessed                                       General Comments  Pt with boots on both feet and has some skin issues but pt well aware of them and cares for them well with creams    Exercises     Shoulder Instructions      Home Living Family/patient expects to be discharged to:: Assisted living                             Home Equipment: Other (comment);Grab bars - toilet (rollator with 3 wheels)          Prior Functioning/Environment Prior Level of Function : Independent/Modified Independent               ADLs Comments: sponge bathes only        OT Problem List: Impaired balance (sitting and/or standing);Decreased knowledge of use of DME or AE;Cardiopulmonary status limiting activity      OT Treatment/Interventions: Self-care/ADL training;Therapeutic activities;DME and/or AE instruction    OT Goals(Current goals can be found in the care plan section) Acute  Rehab OT Goals Patient Stated Goal: to get home OT Goal Formulation: With patient Time For Goal Achievement: 09/27/22 Potential to Achieve Goals: Good ADL Goals Additional ADL Goal #1: Pt wll walk to bathroom with rollator and complete all toileting/washing hands with mod I. Additional ADL Goal #2: Pt wil gather all clothing and dress on EOB with mod I Additional ADL Goal #3: Pt will state 2 things he can do at ALF to conserve energy without cues.  OT Frequency: Min 2X/week    Co-evaluation              AM-PAC OT "6 Clicks" Daily Activity     Outcome Measure Help from another person eating meals?: None Help from another person taking care of personal grooming?: None Help from another person toileting, which includes using toliet, bedpan, or urinal?: A Little Help from another person bathing (including washing, rinsing, drying)?: A Little Help from another person to put on and taking off regular upper body clothing?: None Help from another person to put on and taking off regular lower body clothing?: A Little 6 Click Score: 21   End of Session Equipment Utilized During Treatment:  (rollator) Nurse Communication:  Mobility status  Activity Tolerance: Patient tolerated treatment well Patient left: in chair;with call bell/phone within reach;with chair alarm set  OT Visit Diagnosis: Unsteadiness on feet (R26.81)                Time: 0912-0950 OT Time Calculation (min): 38 min Charges:  OT General Charges $OT Visit: 1 Visit OT Evaluation $OT Eval Low Complexity: 1 Low OT Treatments $Self Care/Home Management : 8-22 mins  Glenford Peers 09/13/2022, 10:01 AM

## 2022-09-13 NOTE — TOC Progression Note (Addendum)
Transition of Care [facility]) - Progression Note    Patient Details  Name: [name] MRN: [id number] Date of Birth: 03-15-1951  [facility] CM/SW Contact  Ross Ludwig, [place] Phone Number: 09/13/2022, 9:58 AM  Clinical Narrative:     CSW attempted to call [facility], attempted to leave a message 3x and then unable to leave messages.  CSW to continue to try to get a hold of [facility].  10:15 am  CSW called [facility] at [phone] and spoke to them.  They are requesting that patient's discharge summary and FL2 be faxed to [phone].  Per [employer], they can pick patient up around 3pm.  12:00pm  CSW faxed requested documentation to ALF, and informed them it has been faxed out.     Expected Discharge Plan: Assisted Living Barriers to Discharge: Continued Medical Work up  Expected Discharge Plan and Services Expected Discharge Plan: Assisted Living   Discharge Planning Services: NA     Expected Discharge Date: 09/13/22                                     Social Determinants of Health (SDOH) Interventions    Readmission Risk Interventions     No data to display

## 2022-09-13 NOTE — NC FL2 (Signed)
[place] LEVEL OF CARE SCREENING TOOL     IDENTIFICATION  Patient Name: [name] Birthdate: 1951/02/19 Sex: male Admission Date (Current Location): 09/09/2022  [place] and [place] Number:  [name] [id number] [place] and Address:  [facility],  [place], [place]      Provider Number: [id number]  Attending Physician Name and Address:  Mercy Riding, MD  Relative Name and Phone Number:  Cassell Smiles [phone]  [phone]    Current Level of Care: Hospital Recommended Level of Care: [place] [facility]) Prior Approval Number:    Date Approved/Denied:   PASRR Number:    Discharge Plan: Other (Comment) ([facility])    Current Diagnoses: Patient Active Problem List   Diagnosis Date Noted   IDA (iron deficiency anemia) 09/10/2022   Chronic diastolic CHF (congestive heart failure) ([place]) 09/10/2022   Chronic anticoagulation 09/09/2022   Personal history of cecal colon cancer 09/09/2022   Anemia 09/09/2022   Venous stasis ulcers- BLE 09/09/2022   Acute cholecystitis due to biliary calculus 09/09/2022   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   Subacute eczematous & allergic contact dermatitis  09/09/2022   History of COVID-19 09/09/2022   History of homelessness 09/09/2022   Diverticulosis of colon 09/09/2022   Cholelithiasis 09/09/2022   Hepatic steatosis 09/09/2022   Living in assisted living 09/09/2022   Pain in right foot 05/06/2022   Atrial fibrillation with slow ventricular response ([place]) 12/23/2021   Pain of joint of left ankle and foot 02/26/2021   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   Abnormal CT scan, colon    AKI (acute kidney injury) ([place])    Generalized weakness    Abdominal pain    Protein-calorie malnutrition, severe 11/10/2016   Constipation, chronic 11/09/2016   CKD (chronic kidney disease) stage 3, GFR 30-59  ml/min (HCC) 11/09/2016   Hyperbilirubinemia 11/09/2016   Hyponatremia 11/08/2016   RBBB 09/13/2013   Snoring 09/13/2013   Edema 09/07/2013   Diabetes mellitus type 2 in obese ([place]) 03/16/2013   Arthritis 03/16/2013   Essential hypertension, benign 02/23/2013   Morbid obesity ([place]) 02/23/2013   Paroxysmal atrial fibrillation ([place]) 02/23/2013   Hyperlipidemia 02/23/2013    Orientation RESPIRATION BLADDER Height & Weight     Self, Time, Place, Situation  Normal Continent Weight: (!) 306 lb (138.8 kg) Height:  '6\' 3"'$  (190.5 cm)  BEHAVIORAL SYMPTOMS/MOOD NEUROLOGICAL BOWEL NUTRITION STATUS      Continent Diet (Regular diet)  AMBULATORY STATUS COMMUNICATION OF NEEDS Skin   Supervision Verbally Surgical wounds                       Personal Care Assistance Level of Assistance  Bathing, Feeding, Dressing Bathing Assistance: Limited assistance Feeding assistance: Independent Dressing Assistance: Limited assistance     Functional Limitations Info  Sight, Speech, Hearing Sight Info: Adequate Hearing Info: Adequate Speech Info: Adequate    SPECIAL CARE FACTORS FREQUENCY                       Contractures Contractures Info: Not present    Additional Factors Info  Code Status Code Status Info: Full Code             Current Medications (09/13/2022):  This is the current hospital active medication list Current Facility-Administered Medications  Medication Dose Route Frequency Provider Last Rate Last Admin   acetaminophen (TYLENOL) tablet 650 mg  650 mg Oral Q6H PRN Coralie Keens, MD   650 mg at 09/09/22 2101   Or   acetaminophen (TYLENOL) suppository 650 mg  650 mg Rectal Q6H PRN Coralie Keens, MD       alum & mag hydroxide-simeth (MAALOX/MYLANTA) 200-200-20 MG/5ML suspension 30 mL  30 mL Oral Q6H PRN Coralie Keens, MD       apixaban Arne Cleveland) tablet 5 mg  5 mg Oral BID Wendee Beavers T, MD   5 mg at 09/13/22 0913   cyanocobalamin (VITAMIN B12)  injection 1,000 mcg  1,000 mcg Intramuscular Daily Coralie Keens, MD   1,000 mcg at 09/13/22 1054   Followed by   Derrill Memo ON 09/15/2022] cyanocobalamin (VITAMIN B12) tablet 1,000 mcg  1,000 mcg Oral Daily Coralie Keens, MD       ferric gluconate (FERRLECIT) 250 mg in sodium chloride 0.9 % 250 mL IVPB  250 mg Intravenous Daily Wendee Beavers T, MD 135 mL/hr at 09/13/22 1053 250 mg at 09/13/22 1053   insulin aspart (novoLOG) injection 0-9 Units  0-9 Units Subcutaneous Q4H Coralie Keens, MD   1 Units at 09/13/22 0500   lip balm (CARMEX) ointment   Topical BID Coralie Keens, MD   Given at 09/13/22 6283   magic mouthwash  15 mL Oral QID PRN Coralie Keens, MD       melatonin tablet 10 mg  10 mg Oral QHS PRN Coralie Keens, MD   10 mg at 09/11/22 0118   menthol-cetylpyridinium (CEPACOL) lozenge 3 mg  1 lozenge Oral PRN Coralie Keens, MD       oxyCODONE (Oxy IR/ROXICODONE) immediate release tablet 5 mg  5 mg Oral Q4H PRN Coralie Keens, MD   5 mg at 09/13/22 0106   Or   morphine (PF) 2 MG/ML injection 2 mg  2 mg Intravenous Q2H PRN Coralie Keens, MD   2 mg at 09/11/22 1825   morphine (PF) 2 MG/ML injection            ondansetron (ZOFRAN) injection 4 mg  4 mg Intravenous Q6H PRN Coralie Keens, MD       Or   ondansetron (ZOFRAN) 8 mg in sodium chloride 0.9 % 50 mL IVPB  8 mg Intravenous Q6H PRN Coralie Keens, MD       ondansetron [facility]) tablet 4 mg  4 mg Oral Q6H PRN Coralie Keens, MD       Or   ondansetron [facility]) injection 4 mg  4 mg Intravenous Q6H PRN Coralie Keens, MD       pantoprazole (PROTONIX) EC tablet 40 mg  40 mg Oral Daily Coralie Keens, MD   40 mg at 09/13/22 0914   phenol (CHLORASEPTIC) mouth spray 2 spray  2 spray Mouth/Throat PRN Coralie Keens, MD       polyethylene glycol (MIRALAX / GLYCOLAX) packet 17 g  17 g Oral BID Coralie Keens, MD   17 g at 09/13/22 0914   polyethylene glycol (MIRALAX / GLYCOLAX) packet 51 g  51 g Oral  Once Coralie Keens, MD       prochlorperazine (COMPAZINE) injection 5-10 mg  5-10 mg Intravenous Q4H PRN Coralie Keens, MD       tamsulosin [facility]) capsule 0.4 mg  0.4 mg Oral Daily Coralie Keens, MD   0.4 mg at 09/13/22 0913   terbinafine (LAMISIL) 1 % cream   Topical BID Mercy Riding, MD   Given at 09/12/22 6629     Discharge Medications: STOP taking these medications  glipiZIDE 10 MG tablet Commonly known as: GLUCOTROL           TAKE these medications     apixaban 5 MG Tabs tablet Commonly known as: ELIQUIS Take 1 tablet (5 mg total) by mouth 2 (two) times daily.    atorvastatin 20 MG tablet Commonly known as: LIPITOR Take 20 mg by mouth daily.    carbamide peroxide 6.5 % OTIC solution Commonly known as: DEBROX Place 5 drops into the right ear 2 (two) times daily.    cyanocobalamin 1000 MCG tablet Take 1 tablet (1,000 mcg total) by mouth daily. Start taking on: September 15, 2022 What changed:  medication strength how much to take These instructions start on September 15, 2022. If you are unsure what to do until then, ask your doctor or other care provider.    dextrose 40 % Gel Commonly known as: GLUTOSE Take 1 Tube by mouth once as needed for low blood sugar. Give Glucose Gel by mouth for BS<70. Repeat CBG in 16 minutes, Repeat Gel & Recheck CBG in 15 minutes. Call MD if Deemed Necessary. Call 911 if Resident is Symptomatic, Not Alert or Unconscious    docusate sodium 100 MG capsule Commonly known as: COLACE Take 1 capsule (100 mg total) by mouth 2 (two) times daily.    folic acid 1 MG tablet Commonly known as: FOLVITE Take 1 mg by mouth See admin instructions. '1mg'$  everyday EXCEPT none on Fridays.    furosemide 80 MG tablet Commonly known as: LASIX Take 80 mg by mouth daily. What changed: Another medication with the same name was removed. Continue taking this medication, and follow the directions you see here.    hydrOXYzine 25 MG tablet Commonly  known as: ATARAX Take 1 tablet (25 mg total) by mouth every 8 (eight) hours as needed for itching. Use caution as this medication may make you drowsy    Januvia 100 MG tablet Generic drug: sitaGLIPtin Take 100 mg by mouth daily.    metFORMIN 1000 MG tablet Commonly known as: GLUCOPHAGE Take 1,000 mg by mouth 2 (two) times daily with a meal.    methotrexate 2.5 MG tablet Commonly known as: RHEUMATREX Take 10 mg by mouth once a week. Takes on Fridays.    oxyCODONE 5 MG immediate release tablet Commonly known as: Oxy IR/ROXICODONE Take 1 tablet (5 mg total) by mouth every 6 (six) hours as needed for severe pain.    pantoprazole 40 MG tablet Commonly known as: PROTONIX Take 1 tablet (40 mg total) by mouth daily.    polyethylene glycol 17 g packet Commonly known as: MiraLax Take 17 g by mouth daily. Hold for diarrhea    potassium chloride SA 20 MEQ tablet Commonly known as: KLOR-CON M Take 20 mEq by mouth daily.    PRESCRIPTION MEDICATION Apply 1 application topically daily. Tac 0.1% Cetaphil 1:4 Apply to lower legs    senna-docusate 8.6-50 MG tablet Commonly known as: Senokot-S Take 1 tablet by mouth 2 (two) times daily between meals as needed for moderate constipation.    tamsulosin 0.4 MG Caps capsule Commonly known as: FLOMAX Take 1 capsule (0.4 mg total) by mouth daily.    terbinafine 1 % cream Commonly known as: LAMISIL Apply topically 2 (two) times daily.    triamcinolone cream 0.1 % Commonly known as: KENALOG Apply 1 application topically See admin instructions. 1 application topical twice daily to itchy areas And 1 extra application daily as needed for dermatitis    Relevant Imaging  Results:  Relevant Lab Results:   Additional Information SSN [id number]  Ross Ludwig, LCSW

## 2022-09-13 NOTE — Progress Notes (Signed)
Patient was given discharge instructions, and all questions were answered.  Patient was stable for discharge and was taken to the main exit by wheelchair. 

## 2022-09-13 NOTE — Discharge Summary (Signed)
Physician Discharge Summary  [name] IPJ:[id number] DOB: 1951-08-23 DOA: 09/09/2022  PCP: Sandi Mariscal, MD  Admit date: 09/09/2022 Discharge date: 09/13/2022 Admitted From: ALF Disposition: ALF Recommendations for Outpatient Follow-up:  Follow up with PCP in in 1 to 2 weeks or sooner if needed Outpatient follow-up with general surgery as below Check BMP and CBC in 1 to 2 weeks Please follow up on the following pending results: None  Home Health: None Equipment/Devices: None  Discharge Condition: Stable CODE STATUS: Full code  Follow-up [facility] Call.   Specialty: General Surgery Why: We are working on your appointment,call to confirm, Arrive 50mn early to check in, fill out paperwork, BEngineer, civil (consulting)ID and iDoctor, general practiceinformation: [address] 71 [facility] M with PMH of cecal adenocarcinoma status postresection, A-fib on Eliquis, NIDDM-2, anxiety, depression, morbid obesity, chronic constipation, lymphedema and foot fractures presenting with fall days of right upper quadrant pain with associated nausea and dry heaving and admitted for possible acute calculus cholecystitis and AKI.  CT and RUQ UKorearaises concern for acute calculus cholecystitis.  However, he has no fever or leukocytosis.  Started on IV antibiotics.  General surgery consulted.  HIDA scan positive.   Patient underwent lap chole on 09/11/2022.  No postop complication.  Evaluated by therapy and no need identified.  Antibiotic discontinued on postop day 1.  Remained stable and cleared for discharge by general surgery for outpatient follow-up.  See individual problem list below for more.   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  Seems to have positive Murphy sign.  CT and RUQ UKorearaises concern for calculus cholecystitis.  HIDA scan positive. -S/p lap chole on 09/11/2022.  Antibiotics discontinued postop day 1. -Cleared for discharge by general surgery for outpatient follow-up.   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 Recent Labs    12/22/21 0918 12/22/21 1841 12/23/21 0333 09/09/22 1042 09/10/22 0511 09/11/22 0539 09/12/22 0529 09/13/22 0328  BUN 22  --  '17 18 18 17 19 20  '$ CREATININE 1.09 0.90 1.04 1.46* 1.38* 1.48* 1.44* 1.38*  -Recheck renal function in 1 to 2 weeks   Chronic diastolic CHF: TTE in 71/3419with LVEF of 60 to 65%, indeterminate DD, normal RVSP.  Appears compensated other than chronic lymphedema.   -Resume home Lasix -Recheck renal functions and electrolytes in 1 to 2 weeks   Controlled NIDDM-2 with hypoglycemia: A1c 5.3%.  Seems to be on glipizide, Januvia and  metformin at home.  Discontinued glipizide given risk for hypoglycemia.  Continue Januvia and metformin.  -Consider GLP-1 agonist  Paroxysmal A-fib: Rate controlled without medication.  On Eliquis for anticoagulation. -Continue home Eliquis.   Venous stasis ulcers/lymphedema/tinea pedis- BLE -Continue supportive  care -Apply terbinafine cream twice daily   Anemia of chronic disease: Slight drop in Hgb likely dilutional.  Anemia panel with some degree of iron deficiency and B12 deficiency. -Received vitamin B12 injection daily x2 days and discharged on p.o. vitamin B12. -IV ferric gluconate 250 mg daily on 10/1 and 10/2 -Recheck CBC at follow-up   Eczema and contact dermatitis-takes methotrexate every Friday. -Resume methotrexate on discharge   Personal history of cecal colon cancer s/p right colectomy in 2017.  Does not look like he had repeat colonoscopy after surgery. -Outpatient follow-up with GI for colonoscopy   BPH with obstruction/lower urinary tract symptoms -Continue home Flomax   Chronic constipation -MiraLAX and Senokot-S as needed. -Outpatient follow-up with GI for repeat colonoscopy   Ambulatory dysfunction/physical deconditioning/subacute foot fracture -No need identified by therapy. -Outpatient follow-up with podiatry orthopedic surgery as previously planned   Hyponatremia: Resolved.   Morbid obesity: Body mass index is 38.25 kg/m. -Consider GLP-1 agonist given history of diabetes           Vital signs Vitals:   09/12/22 0412 09/12/22 1253 09/12/22 1955 09/13/22 0515  BP: 103/69 111/63 127/60 (!) 110/55  Pulse: (!) 55 64 70 65  Temp: 98.1 F (36.7 C) 98.4 F (36.9 C) 98.8 F (37.1 C) 98.8 F (37.1 C)  Resp: '16 18 18 18  '$ Height:      Weight:      SpO2: 95% (!) 86% 99% 94%  TempSrc: Oral Oral Oral Oral  BMI (Calculated):         Discharge exam  GENERAL: No apparent distress.  Nontoxic. HEENT: MMM.  Vision and hearing grossly intact.  NECK: Supple.  No apparent JVD.  RESP:  No IWOB.  Fair aeration bilaterally. CVS:  RRR. Heart sounds normal.  ABD/GI/GU: BS+. Abd soft, NTND.  Laparoscopic wounds DCI. MSK/EXT:  Moves extremities.  1+ BLE edema. SKIN: Skin induration between his toes. NEURO: Awake and alert. Oriented appropriately.  No apparent focal  neuro deficit. PSYCH: Calm. Normal affect.   Discharge Instructions Discharge Instructions     Call MD for:  difficulty breathing, headache or visual disturbances   Complete by: As directed    Call MD for:  persistant dizziness or light-headedness   Complete by: As directed    Call MD for:  persistant nausea and vomiting   Complete by: As directed    Call MD for:  severe uncontrolled pain   Complete by: As directed    Diet - low sodium heart healthy   Complete by: As directed    Diet Carb Modified   Complete by: As directed    Discharge instructions   Complete by: As directed    It has been a pleasure taking care of you!  You were hospitalized due to gallbladder infection for which you have been treated surgically medically.  Follow-up with general surgery per the recommendation.  Take your medications as prescribed.  Follow-up with your primary care doctor in 1 to 2 weeks or sooner if needed.   Take care,   Increase activity slowly   Complete by: As directed    No wound care   Complete by: As directed       Allergies as of 09/13/2022  Reactions   Other    [name] bandage (self-gripping) - erythema, itching        Medication List     STOP taking these medications    glipiZIDE 10 MG tablet Commonly known as: GLUCOTROL       TAKE these medications    apixaban 5 MG Tabs tablet Commonly known as: ELIQUIS Take 1 tablet (5 mg total) by mouth 2 (two) times daily.   atorvastatin 20 MG tablet Commonly known as: LIPITOR Take 20 mg by mouth daily.   carbamide peroxide 6.5 % OTIC solution Commonly known as: DEBROX Place 5 drops into the right ear 2 (two) times daily.   cyanocobalamin 1000 MCG tablet Take 1 tablet (1,000 mcg total) by mouth daily. Start taking on: September 15, 2022 What changed:  medication strength how much to take These instructions start on September 15, 2022. If you are unsure what to do until then, ask your doctor or other  care provider.   dextrose 40 % Gel Commonly known as: GLUTOSE Take 1 Tube by mouth once as needed for low blood sugar. Give Glucose Gel by mouth for BS<70. Repeat CBG in 16 minutes, Repeat Gel & Recheck CBG in 15 minutes. Call MD if Deemed Necessary. Call 911 if Resident is Symptomatic, Not Alert or Unconscious   docusate sodium 100 MG capsule Commonly known as: COLACE Take 1 capsule (100 mg total) by mouth 2 (two) times daily.   folic acid 1 MG tablet Commonly known as: FOLVITE Take 1 mg by mouth See admin instructions. '1mg'$  everyday EXCEPT none on Fridays.   furosemide 80 MG tablet Commonly known as: LASIX Take 80 mg by mouth daily. What changed: Another medication with the same name was removed. Continue taking this medication, and follow the directions you see here.   hydrOXYzine 25 MG tablet Commonly known as: ATARAX Take 1 tablet (25 mg total) by mouth every 8 (eight) hours as needed for itching. Use caution as this medication may make you drowsy   Januvia 100 MG tablet Generic drug: sitaGLIPtin Take 100 mg by mouth daily.   metFORMIN 1000 MG tablet Commonly known as: GLUCOPHAGE Take 1,000 mg by mouth 2 (two) times daily with a meal.   methotrexate 2.5 MG tablet Commonly known as: RHEUMATREX Take 10 mg by mouth once a week. Takes on Fridays.   oxyCODONE 5 MG immediate release tablet Commonly known as: Oxy IR/ROXICODONE Take 1 tablet (5 mg total) by mouth every 6 (six) hours as needed for severe pain.   pantoprazole 40 MG tablet Commonly known as: PROTONIX Take 1 tablet (40 mg total) by mouth daily.   polyethylene glycol 17 g packet Commonly known as: MiraLax Take 17 g by mouth daily. Hold for diarrhea   potassium chloride SA 20 MEQ tablet Commonly known as: KLOR-CON M Take 20 mEq by mouth daily.   PRESCRIPTION MEDICATION Apply 1 application topically daily. Tac 0.1% Cetaphil 1:4 Apply to lower legs   senna-docusate 8.6-50 MG tablet Commonly known as:  Senokot-S Take 1 tablet by mouth 2 (two) times daily between meals as needed for moderate constipation.   tamsulosin 0.4 MG Caps capsule Commonly known as: FLOMAX Take 1 capsule (0.4 mg total) by mouth daily.   terbinafine 1 % cream Commonly known as: LAMISIL Apply topically 2 (two) times daily.   triamcinolone cream 0.1 % Commonly known as: KENALOG Apply 1 application topically See admin instructions. 1 application topical twice daily to itchy areas And 1 extra  application daily as needed for dermatitis        Consultations: General surgery  Procedures/Studies: 9/30-laparoscopic cholecystectomy   ECHOCARDIOGRAM COMPLETE  Result Date: 09/10/2022    ECHOCARDIOGRAM REPORT   Patient Name:   [name] Date of Exam: 09/10/2022 Medical Rec #:  [id number]       Height:       75.0 in Accession #:    [id number]      Weight:       306.0 lb Date of Birth:  1951/10/08       BSA:          2.629 m Patient Age:    22 years        BP:           114/95 mmHg Patient Gender: M               HR:           81 bpm. Exam Location:  Inpatient Procedure: 2D Echo, Cardiac Doppler and Color Doppler Indications:    Pre-operative  History:        Patient has prior history of Echocardiogram examinations, most                 recent 12/23/2021. Risk Factors:Diabetes, Dyslipidemia and                 Hypertension.  Sonographer:    Jefferey Pica Referring Phys: [id number] Charlesetta Ivory [place] Clowers  Sonographer Comments: Image acquisition challenging due to respiratory motion and Image acquisition challenging due to patient body habitus. IMPRESSIONS  1. Left ventricular ejection fraction, by estimation, is 60 to 65%. The left ventricle has normal function. The left ventricle has no regional wall motion abnormalities. There is mild left ventricular hypertrophy. Left ventricular diastolic parameters were normal.  2. Right ventricular systolic function is normal. The right ventricular size is normal. There is moderately elevated  pulmonary artery systolic pressure.  3. Left atrial size was moderately dilated.  4. The mitral valve is abnormal. Trivial mitral valve regurgitation. No evidence of mitral stenosis. Moderate mitral annular calcification.  5. The aortic valve is tricuspid. There is moderate calcification of the aortic valve. There is moderate thickening of the aortic valve. Aortic valve regurgitation is not visualized. Aortic valve sclerosis is present, with no evidence of aortic valve stenosis.  6. Aortic dilatation noted. There is severe dilatation of the aortic root, measuring 46 mm. There is mild dilatation of the ascending aorta, measuring 39 mm.  7. The inferior vena cava is dilated in size with >50% respiratory variability, suggesting right atrial pressure of 8 mmHg. FINDINGS  Left Ventricle: Left ventricular ejection fraction, by estimation, is 60 to 65%. The left ventricle has normal function. The left ventricle has no regional wall motion abnormalities. The left ventricular internal cavity size was normal in size. There is  mild left ventricular hypertrophy. Left ventricular diastolic parameters were normal. Right Ventricle: The right ventricular size is normal. No increase in right ventricular wall thickness. Right ventricular systolic function is normal. There is moderately elevated pulmonary artery systolic pressure. The tricuspid regurgitant velocity is 3.11 m/s, and with an assumed right atrial pressure of 15 mmHg, the estimated right ventricular systolic pressure is 74.9 mmHg. Left Atrium: Left atrial size was moderately dilated. Right Atrium: Right atrial size was normal in size. Pericardium: There is no evidence of pericardial effusion. Mitral Valve: The mitral valve is abnormal. There is mild thickening of the mitral valve leaflet(s). There  is mild calcification of the mitral valve leaflet(s). Moderate mitral annular calcification. Trivial mitral valve regurgitation. No evidence of mitral valve stenosis. Tricuspid  Valve: The tricuspid valve is normal in structure. Tricuspid valve regurgitation is mild . No evidence of tricuspid stenosis. Aortic Valve: The aortic valve is tricuspid. There is moderate calcification of the aortic valve. There is moderate thickening of the aortic valve. Aortic valve regurgitation is not visualized. Aortic valve sclerosis is present, with no evidence of aortic valve stenosis. Aortic valve peak gradient measures 6.6 mmHg. Pulmonic Valve: The pulmonic valve was normal in structure. Pulmonic valve regurgitation is not visualized. No evidence of pulmonic stenosis. Aorta: Consider CTA to further define aortic aneurysm Aorta measures 4.7 on prior TTE 12/23/21. Aortic dilatation noted. There is severe dilatation of the aortic root, measuring 46 mm. There is mild dilatation of the ascending aorta, measuring 39 mm. Venous: The inferior vena cava is dilated in size with greater than 50% respiratory variability, suggesting right atrial pressure of 8 mmHg. IAS/Shunts: No atrial level shunt detected by color flow Doppler.  LEFT VENTRICLE PLAX 2D LVIDd:         4.60 cm   Diastology LVIDs:         2.80 cm   LV e' medial:    5.46 cm/s LV PW:         1.30 cm   LV E/e' medial:  23.6 LV IVS:        1.20 cm   LV e' lateral:   8.03 cm/s LVOT diam:     2.60 cm   LV E/e' lateral: 16.1 LV SV:         124 LV SV Index:   47 LVOT Area:     5.31 cm  RIGHT VENTRICLE            IVC RV S prime:     9.45 cm/s  IVC diam: 3.20 cm LEFT ATRIUM             Index        RIGHT ATRIUM           Index LA diam:        4.60 cm 1.75 cm/m   RA Area:     24.40 cm LA Vol (A2C):   79.7 ml 30.31 ml/m  RA Volume:   84.40 ml  32.10 ml/m LA Vol (A4C):   75.9 ml 28.87 ml/m LA Biplane Vol: 83.4 ml 31.72 ml/m  AORTIC VALVE                 PULMONIC VALVE AV Area (Vmax): 4.65 cm     PV Vmax:       0.71 m/s AV Vmax:        128.00 cm/s  PV Peak grad:  2.0 mmHg AV Peak Grad:   6.6 mmHg LVOT Vmax:      112.00 cm/s LVOT Vmean:     75.800 cm/s LVOT  VTI:       0.234 m  AORTA Ao Root diam: 4.60 cm Ao Asc diam:  3.90 cm MITRAL VALVE                TRICUSPID VALVE MV Area (PHT): 4.68 cm     TR Peak grad:   38.7 mmHg MV Decel Time: 162 msec     TR Vmax:        311.00 cm/s MV E velocity: 129.00 cm/s MV A velocity: 41.90 cm/s   SHUNTS MV E/A ratio:  3.08         Systemic VTI:  0.23 m                             Systemic Diam: 2.60 cm Jenkins Rouge MD Electronically signed by Jenkins Rouge MD Signature Date/Time: 09/10/2022/3:29:20 PM    Final    NM Hepatobiliary Liver Func  Result Date: 09/10/2022 CLINICAL DATA:  Cholelithiasis.  Epigastric pain. EXAM: NUCLEAR MEDICINE HEPATOBILIARY IMAGING TECHNIQUE: Sequential images of the abdomen were obtained out to 60 minutes following intravenous administration of radiopharmaceutical. RADIOPHARMACEUTICALS:  5.5 mCi Tc-60m Choletec IV COMPARISON:  Ultrasound and CT 09/09/2022 FINDINGS: Prompt clearance of radiotracer from blood pool and homogeneous uptake in liver. Counts are evident in the common bile duct and small bowel by 20 minutes. Gallbladder fails to fill at 45 minutes. 3 mg IV morphine was administered to augment filling of the gallbladder. Gallbladder failed to fill after morphine augmentation. IMPRESSION: Non filling of the gallbladder is most consistent with cystic duct obstruction (acute cholecystitis). These results will be called to the ordering clinician or representative by the Radiologist Assistant, and communication documented in the PACS or [employer] Electronically Signed   By: SSuzy BouchardM.D.   On: 09/10/2022 13:02   UKoreaAbdomen Limited RUQ (LIVER/GB)  Result Date: 09/09/2022 CLINICAL DATA:  Right upper quadrant pain EXAM: ULTRASOUND ABDOMEN LIMITED RIGHT UPPER QUADRANT COMPARISON:  CT abdomen and pelvis same day FINDINGS: Gallbladder: Multiple gallstones are present measuring up to 1.8 cm. Gallbladder sludge is present. Gallbladder wall is borderline thickened measuring 3 mm. Common  bile duct: Diameter: Not visualized Liver: No focal lesion identified. Increase in parenchymal echogenicity. Portal vein is patent on color Doppler imaging with normal direction of blood flow towards the liver. Other: None. IMPRESSION: 1. Cholelithiasis and gallbladder sludge. Gallbladder wall is borderline thickened measuring 3 mm. Findings are equivocal for acute cholecystitis. 2. Hepatic steatosis. Electronically Signed   By: ARonney AstersM.D.   On: 09/09/2022 18:41   CT ABDOMEN PELVIS W CONTRAST  Result Date: 09/09/2022 CLINICAL DATA:  Acute epigastric abdominal pain. EXAM: CT ABDOMEN AND PELVIS WITH CONTRAST TECHNIQUE: Multidetector CT imaging of the abdomen and pelvis was performed using the standard protocol following bolus administration of intravenous contrast. RADIATION DOSE REDUCTION: This exam was performed according to the departmental dose-optimization program which includes automated exposure control, adjustment of the mA and/or kV according to patient size and/or use of iterative reconstruction technique. CONTRAST:  1064mOMNIPAQUE IOHEXOL 350 MG/ML SOLN COMPARISON:  December 22, 2021. FINDINGS: Lower chest: Minimal bilateral pleural effusions are noted with minimal adjacent subsegmental atelectasis. Hepatobiliary: No biliary dilatation is noted. Cholelithiasis is noted with 1.8 cm stone noted in neck of gallbladder. There are now noted mild inflammatory changes around the gallbladder concerning for possible cholecystitis. Liver is unremarkable. Pancreas: Unremarkable. No pancreatic ductal dilatation or surrounding inflammatory changes. Spleen: Normal in size without focal abnormality. Adrenals/Urinary Tract: Adrenal glands appear normal. Parapelvic cysts are seen on the left. No hydronephrosis or renal obstruction is noted. Urinary bladder is unremarkable. Stomach/Bowel: Stomach appears normal. There is no evidence of bowel obstruction or inflammation. Postsurgical changes are seen involving the  right colon. Sigmoid diverticulosis is noted without inflammation. Vascular/Lymphatic: Aortic atherosclerosis. No enlarged abdominal or pelvic lymph nodes. Reproductive: Prostate is unremarkable. Other: Small left inguinal hernia is noted which contains a portion of sigmoid colon, but does not result in obstruction. Musculoskeletal: No acute or  significant osseous findings. IMPRESSION: Cholelithiasis is noted with mild inflammatory stranding surrounding gallbladder concerning for acute cholecystitis. Ultrasound is recommended for further evaluation. Minimal bilateral pleural effusions are noted. Sigmoid diverticulosis without inflammation. Small left inguinal hernia is noted which contains a portion of sigmoid colon, but does not result in obstruction. Aortic Atherosclerosis (ICD10-I70.0). Electronically Signed   By: Marijo Conception M.D.   On: 09/09/2022 16:39   CT Angio Chest PE W and/or Wo Contrast  Result Date: 09/09/2022 CLINICAL DATA:  Epigastric pain and shortness of breath. EXAM: CT ANGIOGRAPHY CHEST WITH CONTRAST TECHNIQUE: Multidetector CT imaging of the chest was performed using the standard protocol during bolus administration of intravenous contrast. Multiplanar CT image reconstructions and MIPs were obtained to evaluate the vascular anatomy. RADIATION DOSE REDUCTION: This exam was performed according to the departmental dose-optimization program which includes automated exposure control, adjustment of the mA and/or kV according to patient size and/or use of iterative reconstruction technique. CONTRAST:  119m OMNIPAQUE IOHEXOL 350 MG/ML SOLN COMPARISON:  None Available. FINDINGS: Cardiovascular: The pulmonary arteries are adequately opacified. There is no evidence of pulmonary embolism. Central pulmonary arteries are of normal caliber. The thoracic aorta is normal in caliber. No significant aortic atherosclerosis. The heart size is normal. No pericardial fluid identified. No visualized calcified  coronary artery plaque. Mediastinum/Nodes: There are some scattered small and mildly enlarged lymph nodes throughout the mediastinum. The largest in the right lower paratracheal region measures approximately 12 mm in short axis. These are nonspecific. No enlarged hilar or axillary lymph nodes identified. Thyroid gland, trachea, and esophagus demonstrate no significant findings. Lungs/Pleura: There is no evidence of pulmonary edema, consolidation, pneumothorax, nodule or pleural fluid. Upper Abdomen: Decreased attenuation of the liver parenchyma is consistent with steatosis. Musculoskeletal: No chest wall abnormality. No acute or significant osseous findings. Review of the MIP images confirms the above findings. IMPRESSION: 1. No evidence of pulmonary embolism or other acute findings in the chest. 2. Scattered small and mildly enlarged lymph nodes throughout the mediastinum which are nonspecific. 3. Hepatic steatosis. Electronically Signed   By: GAletta EdouardM.D.   On: 09/09/2022 16:37   DG Chest 2 View  Result Date: 09/09/2022 CLINICAL DATA:  Shortness of breath. EXAM: CHEST - 2 VIEW COMPARISON:  02/18/2021 FINDINGS: Stable cardiomediastinal contours. Lung volumes are low. Blunting of the costophrenic angles may reflect small pleural effusions. No significant interstitial edema or airspace disease. Visualized osseous structures are unremarkable. IMPRESSION: 1. Low lung volumes. 2. Suspect small bilateral pleural effusions. Electronically Signed   By: TKerby MoorsM.D.   On: 09/09/2022 11:10       The results of significant diagnostics from this hospitalization (including imaging, microbiology, ancillary and laboratory) are listed below for reference.     Microbiology: Recent Results (from the past 240 hour(s))  SARS Coronavirus 2 by RT PCR (hospital order[facility]) *cepheid single result test* Anterior Nasal Swab     Status: None   Collection Time: 09/09/22  4:30 PM    Specimen: Anterior Nasal Swab  Result Value Ref Range Status   SARS Coronavirus 2 by RT PCR NEGATIVE NEGATIVE Final    Comment: (NOTE) SARS-CoV-2 target nucleic acids are NOT DETECTED.  The SARS-CoV-2 RNA is generally detectable in upper and lower respiratory specimens during the acute phase of infection. The lowest concentration of SARS-CoV-2 viral copies this assay can detect is 250 copies / mL. A negative result does not preclude SARS-CoV-2 infection and should not  be used as the sole basis for treatment or other patient management decisions.  A negative result may occur with improper specimen collection / handling, submission of specimen other than nasopharyngeal swab, presence of viral mutation(s) within the areas targeted by this assay, and inadequate number of viral copies (<250 copies / mL). A negative result must be combined with clinical observations, patient history, and epidemiological information.  Fact Sheet for Patients:   https://www.patel.info/  Fact Sheet for Healthcare Providers: https://hall.com/  This test is not yet approved or  cleared by the [place] FDA and has been authorized for detection and/or diagnosis of SARS-CoV-2 by FDA under an Emergency Use Authorization (EUA).  This EUA will remain in effect (meaning this test can be used) for the duration of the COVID-19 declaration under Section 564(b)(1) of the Act, 21 U.S.C. section 360bbb-3(b)(1), unless the authorization is terminated or revoked sooner.  Performed at [facility], [place] [address], [place], [place] [address]   MRSA Next Gen by PCR, Nasal     Status: None   Collection Time: 09/11/22  1:24 AM   Specimen: Nasal Mucosa; Nasal Swab  Result Value Ref Range Status   MRSA by PCR Next Gen NOT DETECTED NOT DETECTED Final    Comment: (NOTE) The GeneXpert MRSA Assay (FDA approved for NASAL specimens only), is one component of a  comprehensive MRSA colonization surveillance program. It is not intended to diagnose MRSA infection nor to guide or monitor treatment for MRSA infections. Test performance is not FDA approved in patients less than 63 years old. Performed at [facility], [place] [address], [place], [place] [address]      Labs:  CBC: Recent Labs  Lab 09/09/22 1042 09/10/22 0511 09/11/22 0539 09/12/22 0529 09/13/22 0328  WBC 3.9* 4.4 4.8 7.1  --   NEUTROABS  --  3.7  --   --   --   HGB 8.9* 8.2* 8.0* 8.5* 7.8*  HCT 28.3* 26.2* 25.9* 27.0* 25.0*  MCV 105.6* 105.2* 106.6* 105.5*  --   PLT 146* 130* 149* 166  --    BMP &GFR Recent Labs  Lab 09/09/22 1042 09/09/22 2043 09/10/22 0511 09/11/22 0539 09/12/22 0529 09/13/22 0328  NA 134*  --  136 135 133* 135  K 4.0  --  3.8 4.4 4.4 4.0  CL 102  --  101 103 101 103  CO2 23  --  '26 25 24 25  '$ GLUCOSE 67*  --  77 106* 164* 130*  BUN 18  --  '18 17 19 20  '$ CREATININE 1.46*  --  1.38* 1.48* 1.44* 1.38*  CALCIUM 8.7*  --  8.3* 8.3* 8.2* 7.7*  MG  --   --  1.9 1.9 2.0 2.0  PHOS  --  3.8  --  4.0 3.6 2.5   Estimated Creatinine Clearance: 73.8 mL/min (A) (by C-G formula based on SCr of 1.38 mg/dL (H)). Liver & Pancreas: Recent Labs  Lab 09/09/22 1042 09/10/22 0511 09/11/22 0539 09/12/22 0529 09/13/22 0328  AST 20 13* 14* 20  --   ALT '8 10 9 11  '$ --   ALKPHOS 70 72 71 68  --   BILITOT 0.9 1.2 1.1 1.1  --   PROT 8.0 7.3 7.2 7.4  --   ALBUMIN 3.3* 2.9* 3.0* 2.7* 2.6*   Recent Labs  Lab 09/09/22 1042  LIPASE 36   No results for input(s): "AMMONIA" in the last 168 hours. Diabetic: No results for input(s): "  HGBA1C" in the last 72 hours. Recent Labs  Lab 09/12/22 1537 09/12/22 2028 09/12/22 2306 09/13/22 0417 09/13/22 0708  GLUCAP 196* 174* 155* 131* 112*   Cardiac Enzymes: No results for input(s): "CKTOTAL", "CKMB", "CKMBINDEX", "TROPONINI" in the last 168 hours. No results for input(s): "PROBNP" in the last 8760  hours. Coagulation Profile: Recent Labs  Lab 09/09/22 2043  INR 1.5*   Thyroid Function Tests: No results for input(s): "TSH", "T4TOTAL", "FREET4", "T3FREE", "THYROIDAB" in the last 72 hours. Lipid Profile: No results for input(s): "CHOL", "HDL", "LDLCALC", "TRIG", "CHOLHDL", "LDLDIRECT" in the last 72 hours. Anemia Panel: Recent Labs    09/11/22 0539  VITAMINB12 153*  FOLATE 23.7  FERRITIN 40  TIBC 315  IRON 38*  RETICCTPCT 1.9   Urine analysis:    Component Value Date/Time   COLORURINE YELLOW 09/09/2022 1051   APPEARANCEUR CLEAR 09/09/2022 1051   LABSPEC 1.008 09/09/2022 1051   PHURINE 6.0 09/09/2022 1051   GLUCOSEU NEGATIVE 09/09/2022 1051   HGBUR NEGATIVE 09/09/2022 1051   BILIRUBINUR NEGATIVE 09/09/2022 1051   BILIRUBINUR NEG 05/27/2013 1516   KETONESUR NEGATIVE 09/09/2022 1051   PROTEINUR NEGATIVE 09/09/2022 1051   UROBILINOGEN 0.2 05/27/2013 1516   NITRITE NEGATIVE 09/09/2022 1051   LEUKOCYTESUR NEGATIVE 09/09/2022 1051   Sepsis Labs: Invalid input(s): "PROCALCITONIN", "LACTICIDVEN"   SIGNED:  Mercy Riding, MD  [facility] 09/13/2022, 11:15 AM

## 2022-09-13 NOTE — Progress Notes (Addendum)
[facility] Progress Note  2 Days Post-Op  Subjective: CC-  Sitting up in bed eating breakfast. Abdomen sore but no severe pain. No n/v. Passing flatus, no BM. Pain medication helps. He did get OOB to ambulate yesterday.  Objective: Vital signs in last 24 hours: Temp:  [98.4 F (36.9 C)-98.8 F (37.1 C)] 98.8 F (37.1 C) (10/02 0515) Pulse Rate:  [64-70] 65 (10/02 0515) Resp:  [18] 18 (10/02 0515) BP: (110-127)/(55-63) 110/55 (10/02 0515) SpO2:  [86 %-99 %] 94 % (10/02 0515) Last BM Date : 09/05/22 (patient reports a very smal BM 10/01)  Intake/Output from previous day: 10/01 0701 - 10/02 0700 In: 1170.5 [P.O.:610; I.V.:444.1; IV Piggyback:116.3] Out: 1000 [Urine:1000] Intake/Output this shift: No intake/output data recorded.  PE: Gen:  Alert, NAD, pleasant Abd: soft, appropriately tender, lap incisions cdi without signs of infection  Lab Results:  Recent Labs    09/11/22 0539 09/12/22 0529 09/13/22 0328  WBC 4.8 7.1  --   HGB 8.0* 8.5* 7.8*  HCT 25.9* 27.0* 25.0*  PLT 149* 166  --    BMET Recent Labs    09/12/22 0529 09/13/22 0328  NA 133* 135  K 4.4 4.0  CL 101 103  CO2 24 25  GLUCOSE 164* 130*  BUN 19 20  CREATININE 1.44* 1.38*  CALCIUM 8.2* 7.7*   PT/INR No results for input(s): "LABPROT", "INR" in the last 72 hours. CMP     Component Value Date/Time   NA 135 09/13/2022 0328   NA 136 (A) 12/01/2016 0000   K 4.0 09/13/2022 0328   CL 103 09/13/2022 0328   CO2 25 09/13/2022 0328   GLUCOSE 130 (H) 09/13/2022 0328   BUN 20 09/13/2022 0328   BUN 13 12/01/2016 0000   CREATININE 1.38 (H) 09/13/2022 0328   CREATININE 1.10 08/02/2014 1505   CALCIUM 7.7 (L) 09/13/2022 0328   PROT 7.4 09/12/2022 0529   ALBUMIN 2.6 (L) 09/13/2022 0328   AST 20 09/12/2022 0529   ALT 11 09/12/2022 0529   ALKPHOS 68 09/12/2022 0529   BILITOT 1.1 09/12/2022 0529   GFRNONAA 55 (L) 09/13/2022 0328   GFRNONAA 71 08/02/2014 1505   GFRAA >60 01/26/2017 2022    GFRAA 82 08/02/2014 1505   Lipase     Component Value Date/Time   LIPASE 36 09/09/2022 1042       Studies/Results: No results found.  Anti-infectives: Anti-infectives (From admission, onward)    Start     Dose/Rate Route Frequency Ordered Stop   09/10/22 0100  piperacillin-tazobactam (ZOSYN) IVPB 3.375 g  Status:  Discontinued        3.375 g 12.5 mL/hr over 240 Minutes Intravenous Every 8 hours 09/09/22 2022 09/12/22 1355   09/09/22 1900  piperacillin-tazobactam (ZOSYN) IVPB 3.375 g        3.375 g 100 mL/hr over 30 Minutes Intravenous  Once 09/09/22 1848 09/09/22 1940        Assessment/Plan Acute cholecystitis with cholelithiasis POD#2 s/p laparoscopic cholecystectomy 9/30 Dr. Ninfa Linden - Doing well from surgery. [place] for discharge back to assisted living facility from our standpoint. Discharge instructions and follow up info on AVS. I will print rx for oxycodone. We will sign off, please call with questions or concerns.   LOS: 0 days    Wellington Hampshire, [facility] 09/13/2022, 9:07 AM Please see Amion for pager number during day hours 7:00am-4:30pm

## 2022-09-14 LAB — SURGICAL PATHOLOGY

## 2022-10-27 ENCOUNTER — Inpatient Hospital Stay (HOSPITAL_COMMUNITY)
Admission: EM | Admit: 2022-10-27 | Discharge: 2022-11-16 | DRG: 617 | Disposition: A | Discharge status: Skilled Nursing Facility | Payer: Medicare HMO | Attending: Internal Medicine | Admitting: Internal Medicine

## 2022-10-27 ENCOUNTER — Emergency Department (HOSPITAL_COMMUNITY): Payer: Medicare HMO

## 2022-10-27 ENCOUNTER — Other Ambulatory Visit: Payer: Self-pay

## 2022-10-27 ENCOUNTER — Inpatient Hospital Stay (HOSPITAL_COMMUNITY): Payer: Medicare HMO

## 2022-10-27 ENCOUNTER — Encounter (HOSPITAL_COMMUNITY): Payer: Self-pay | Admitting: Internal Medicine

## 2022-10-27 DIAGNOSIS — M009 Pyogenic arthritis, unspecified: Secondary | ICD-10-CM | POA: Diagnosis present

## 2022-10-27 DIAGNOSIS — M069 Rheumatoid arthritis, unspecified: Secondary | ICD-10-CM | POA: Diagnosis present

## 2022-10-27 DIAGNOSIS — N400 Enlarged prostate without lower urinary tract symptoms: Secondary | ICD-10-CM | POA: Diagnosis present

## 2022-10-27 DIAGNOSIS — I878 Other specified disorders of veins: Secondary | ICD-10-CM | POA: Diagnosis present

## 2022-10-27 DIAGNOSIS — E119 Type 2 diabetes mellitus without complications: Secondary | ICD-10-CM | POA: Diagnosis not present

## 2022-10-27 DIAGNOSIS — N138 Other obstructive and reflux uropathy: Secondary | ICD-10-CM | POA: Diagnosis present

## 2022-10-27 DIAGNOSIS — M86272 Subacute osteomyelitis, left ankle and foot: Secondary | ICD-10-CM

## 2022-10-27 DIAGNOSIS — Z7984 Long term (current) use of oral hypoglycemic drugs: Secondary | ICD-10-CM

## 2022-10-27 DIAGNOSIS — Z87891 Personal history of nicotine dependence: Secondary | ICD-10-CM | POA: Diagnosis not present

## 2022-10-27 DIAGNOSIS — Z85038 Personal history of other malignant neoplasm of large intestine: Secondary | ICD-10-CM

## 2022-10-27 DIAGNOSIS — I872 Venous insufficiency (chronic) (peripheral): Secondary | ICD-10-CM | POA: Diagnosis present

## 2022-10-27 DIAGNOSIS — E871 Hypo-osmolality and hyponatremia: Secondary | ICD-10-CM | POA: Diagnosis present

## 2022-10-27 DIAGNOSIS — K5909 Other constipation: Secondary | ICD-10-CM | POA: Diagnosis present

## 2022-10-27 DIAGNOSIS — Z8616 Personal history of COVID-19: Secondary | ICD-10-CM

## 2022-10-27 DIAGNOSIS — L249 Irritant contact dermatitis, unspecified cause: Secondary | ICD-10-CM | POA: Diagnosis present

## 2022-10-27 DIAGNOSIS — N401 Enlarged prostate with lower urinary tract symptoms: Secondary | ICD-10-CM | POA: Diagnosis present

## 2022-10-27 DIAGNOSIS — I480 Paroxysmal atrial fibrillation: Secondary | ICD-10-CM | POA: Diagnosis present

## 2022-10-27 DIAGNOSIS — E669 Obesity, unspecified: Secondary | ICD-10-CM | POA: Diagnosis present

## 2022-10-27 DIAGNOSIS — E11621 Type 2 diabetes mellitus with foot ulcer: Secondary | ICD-10-CM | POA: Diagnosis present

## 2022-10-27 DIAGNOSIS — S88112A Complete traumatic amputation at level between knee and ankle, left lower leg, initial encounter: Secondary | ICD-10-CM | POA: Diagnosis not present

## 2022-10-27 DIAGNOSIS — E440 Moderate protein-calorie malnutrition: Secondary | ICD-10-CM | POA: Diagnosis present

## 2022-10-27 DIAGNOSIS — E1122 Type 2 diabetes mellitus with diabetic chronic kidney disease: Secondary | ICD-10-CM | POA: Diagnosis present

## 2022-10-27 DIAGNOSIS — D638 Anemia in other chronic diseases classified elsewhere: Secondary | ICD-10-CM | POA: Diagnosis present

## 2022-10-27 DIAGNOSIS — D539 Nutritional anemia, unspecified: Secondary | ICD-10-CM | POA: Diagnosis present

## 2022-10-27 DIAGNOSIS — I83018 Varicose veins of right lower extremity with ulcer other part of lower leg: Secondary | ICD-10-CM | POA: Diagnosis present

## 2022-10-27 DIAGNOSIS — Z6838 Body mass index (BMI) 38.0-38.9, adult: Secondary | ICD-10-CM

## 2022-10-27 DIAGNOSIS — M869 Osteomyelitis, unspecified: Secondary | ICD-10-CM

## 2022-10-27 DIAGNOSIS — L03116 Cellulitis of left lower limb: Secondary | ICD-10-CM | POA: Diagnosis present

## 2022-10-27 DIAGNOSIS — E1169 Type 2 diabetes mellitus with other specified complication: Secondary | ICD-10-CM | POA: Diagnosis present

## 2022-10-27 DIAGNOSIS — M86672 Other chronic osteomyelitis, left ankle and foot: Secondary | ICD-10-CM | POA: Diagnosis present

## 2022-10-27 DIAGNOSIS — Z79899 Other long term (current) drug therapy: Secondary | ICD-10-CM

## 2022-10-27 DIAGNOSIS — M60074 Infective myositis, left foot: Secondary | ICD-10-CM | POA: Diagnosis present

## 2022-10-27 DIAGNOSIS — Z888 Allergy status to other drugs, medicaments and biological substances status: Secondary | ICD-10-CM

## 2022-10-27 DIAGNOSIS — I110 Hypertensive heart disease with heart failure: Secondary | ICD-10-CM | POA: Diagnosis not present

## 2022-10-27 DIAGNOSIS — I509 Heart failure, unspecified: Secondary | ICD-10-CM | POA: Diagnosis not present

## 2022-10-27 DIAGNOSIS — L97429 Non-pressure chronic ulcer of left heel and midfoot with unspecified severity: Secondary | ICD-10-CM | POA: Diagnosis present

## 2022-10-27 DIAGNOSIS — N182 Chronic kidney disease, stage 2 (mild): Secondary | ICD-10-CM | POA: Diagnosis present

## 2022-10-27 DIAGNOSIS — I83028 Varicose veins of left lower extremity with ulcer other part of lower leg: Secondary | ICD-10-CM | POA: Diagnosis present

## 2022-10-27 DIAGNOSIS — D631 Anemia in chronic kidney disease: Secondary | ICD-10-CM | POA: Diagnosis present

## 2022-10-27 DIAGNOSIS — Z7901 Long term (current) use of anticoagulants: Secondary | ICD-10-CM

## 2022-10-27 DIAGNOSIS — Z751 Person awaiting admission to adequate facility elsewhere: Secondary | ICD-10-CM

## 2022-10-27 DIAGNOSIS — Z560 Unemployment, unspecified: Secondary | ICD-10-CM

## 2022-10-27 DIAGNOSIS — E1165 Type 2 diabetes mellitus with hyperglycemia: Secondary | ICD-10-CM | POA: Diagnosis present

## 2022-10-27 DIAGNOSIS — I890 Lymphedema, not elsewhere classified: Secondary | ICD-10-CM | POA: Diagnosis present

## 2022-10-27 DIAGNOSIS — E11628 Type 2 diabetes mellitus with other skin complications: Secondary | ICD-10-CM | POA: Diagnosis present

## 2022-10-27 DIAGNOSIS — E785 Hyperlipidemia, unspecified: Secondary | ICD-10-CM | POA: Diagnosis present

## 2022-10-27 DIAGNOSIS — I129 Hypertensive chronic kidney disease with stage 1 through stage 4 chronic kidney disease, or unspecified chronic kidney disease: Secondary | ICD-10-CM | POA: Diagnosis present

## 2022-10-27 DIAGNOSIS — E538 Deficiency of other specified B group vitamins: Secondary | ICD-10-CM | POA: Diagnosis present

## 2022-10-27 DIAGNOSIS — L97529 Non-pressure chronic ulcer of other part of left foot with unspecified severity: Secondary | ICD-10-CM | POA: Diagnosis present

## 2022-10-27 DIAGNOSIS — Z9049 Acquired absence of other specified parts of digestive tract: Secondary | ICD-10-CM

## 2022-10-27 DIAGNOSIS — S92352A Displaced fracture of fifth metatarsal bone, left foot, initial encounter for closed fracture: Secondary | ICD-10-CM | POA: Diagnosis present

## 2022-10-27 DIAGNOSIS — E782 Mixed hyperlipidemia: Secondary | ICD-10-CM

## 2022-10-27 DIAGNOSIS — L02612 Cutaneous abscess of left foot: Secondary | ICD-10-CM | POA: Diagnosis present

## 2022-10-27 DIAGNOSIS — E8809 Other disorders of plasma-protein metabolism, not elsewhere classified: Secondary | ICD-10-CM | POA: Diagnosis present

## 2022-10-27 LAB — COMPREHENSIVE METABOLIC PANEL
ALT: 10 U/L (ref 0–44)
AST: 15 U/L (ref 15–41)
Albumin: 3.6 g/dL (ref 3.5–5.0)
Alkaline Phosphatase: 81 U/L (ref 38–126)
Anion gap: 10 (ref 5–15)
BUN: 20 mg/dL (ref 8–23)
CO2: 29 mmol/L (ref 22–32)
Calcium: 8.7 mg/dL — ABNORMAL LOW (ref 8.9–10.3)
Chloride: 97 mmol/L — ABNORMAL LOW (ref 98–111)
Creatinine, Ser: 1.27 mg/dL — ABNORMAL HIGH (ref 0.61–1.24)
GFR, Estimated: >60 mL/min (ref >60)
Glucose, Bld: 117 mg/dL — ABNORMAL HIGH (ref 70–99)
Potassium: 3.7 mmol/L (ref 3.5–5.1)
Sodium: 136 mmol/L (ref 135–145)
Total Bilirubin: 0.8 mg/dL (ref 0.3–1.2)
Total Protein: 8.6 g/dL — ABNORMAL HIGH (ref 6.5–8.1)

## 2022-10-27 LAB — CBC WITH DIFFERENTIAL/PLATELET
Abs Immature Granulocytes: 0.02 10*3/uL (ref 0.00–0.07)
Basophils Absolute: 0 10*3/uL (ref 0.0–0.1)
Basophils Relative: 0 %
Eosinophils Absolute: 0.3 10*3/uL (ref 0.0–0.5)
Eosinophils Relative: 6 %
HCT: 31 % — ABNORMAL LOW (ref 39.0–52.0)
Hemoglobin: 10.1 g/dL — ABNORMAL LOW (ref 13.0–17.0)
Immature Granulocytes: 0 %
Lymphocytes Relative: 9 %
Lymphs Abs: 0.4 10*3/uL — ABNORMAL LOW (ref 0.7–4.0)
MCH: 34.4 pg — ABNORMAL HIGH (ref 26.0–34.0)
MCHC: 32.6 g/dL (ref 30.0–36.0)
MCV: 105.4 fL — ABNORMAL HIGH (ref 80.0–100.0)
Monocytes Absolute: 0.2 10*3/uL (ref 0.1–1.0)
Monocytes Relative: 3 %
Neutro Abs: 4 10*3/uL (ref 1.7–7.7)
Neutrophils Relative %: 82 %
Platelets: 218 10*3/uL (ref 150–400)
RBC: 2.94 MIL/uL — ABNORMAL LOW (ref 4.22–5.81)
RDW: 17.4 % — ABNORMAL HIGH (ref 11.5–15.5)
WBC: 4.9 10*3/uL (ref 4.0–10.5)
nRBC: 0 % (ref 0.0–0.2)

## 2022-10-27 LAB — TYPE AND SCREEN
ABO/RH(D): O NEG
Antibody Screen: NEGATIVE

## 2022-10-27 LAB — SEDIMENTATION RATE: Sed Rate: 135 mm/hr — ABNORMAL HIGH (ref 0–16)

## 2022-10-27 LAB — MAGNESIUM: Magnesium: 1.8 mg/dL (ref 1.7–2.4)

## 2022-10-27 LAB — LACTIC ACID, PLASMA: Lactic Acid, Venous: 1 mmol/L (ref 0.5–1.9)

## 2022-10-27 MED ORDER — OXYCODONE-ACETAMINOPHEN 5-325 MG PO TABS: 1.0000 | ORAL_TABLET | ORAL | Status: DC | PRN

## 2022-10-27 MED ORDER — DOCUSATE SODIUM 100 MG PO CAPS
100.0000 mg | ORAL_CAPSULE | Freq: Two times a day (BID) | ORAL | Status: DC
  Administered 2022-10-27 – 2022-11-03 (×14): 100 mg via ORAL
  Filled 2022-10-27 (×15): qty 1

## 2022-10-27 MED ORDER — GADOBUTROL 1 MMOL/ML IV SOLN
10.0000 mL | Freq: Once | INTRAVENOUS | Status: AC | PRN
  Administered 2022-10-27: 10 mL via INTRAVENOUS

## 2022-10-27 MED ORDER — ATORVASTATIN CALCIUM 10 MG PO TABS
20.0000 mg | ORAL_TABLET | Freq: Every day | ORAL | Status: DC
  Administered 2022-10-28 – 2022-11-16 (×20): 20 mg via ORAL
  Filled 2022-10-27 (×20): qty 2

## 2022-10-27 MED ORDER — ACETAMINOPHEN 650 MG RE SUPP: 650.0000 mg | Freq: Four times a day (QID) | RECTAL | Status: DC | PRN

## 2022-10-27 MED ORDER — POLYETHYLENE GLYCOL 3350 17 G PO PACK
17.0000 g | PACK | Freq: Every day | ORAL | Status: DC | PRN
  Administered 2022-11-12 – 2022-11-13 (×2): 17 g via ORAL
  Filled 2022-10-27 (×2): qty 1

## 2022-10-27 MED ORDER — OXYCODONE-ACETAMINOPHEN 5-325 MG PO TABS
1.0000 | ORAL_TABLET | Freq: Once | ORAL | Status: AC
  Administered 2022-10-27: 1 via ORAL
  Filled 2022-10-27: qty 1

## 2022-10-27 MED ORDER — INSULIN ASPART 100 UNIT/ML IJ SOLN
0.0000 [IU] | Freq: Three times a day (TID) | INTRAMUSCULAR | Status: DC
  Administered 2022-10-28: 1 [IU] via SUBCUTANEOUS
  Administered 2022-10-28 – 2022-10-29 (×3): 2 [IU] via SUBCUTANEOUS
  Administered 2022-10-29: 1 [IU] via SUBCUTANEOUS
  Administered 2022-10-30: 2 [IU] via SUBCUTANEOUS
  Administered 2022-10-30: 3 [IU] via SUBCUTANEOUS
  Administered 2022-10-30: 1 [IU] via SUBCUTANEOUS
  Administered 2022-10-31 (×2): 2 [IU] via SUBCUTANEOUS
  Administered 2022-10-31 – 2022-11-01 (×2): 3 [IU] via SUBCUTANEOUS
  Administered 2022-11-01: 2 [IU] via SUBCUTANEOUS
  Administered 2022-11-01 – 2022-11-02 (×2): 5 [IU] via SUBCUTANEOUS
  Administered 2022-11-02: 3 [IU] via SUBCUTANEOUS
  Administered 2022-11-02: 1 [IU] via SUBCUTANEOUS
  Administered 2022-11-03 – 2022-11-04 (×2): 2 [IU] via SUBCUTANEOUS
  Administered 2022-11-04: 1 [IU] via SUBCUTANEOUS
  Administered 2022-11-04: 3 [IU] via SUBCUTANEOUS
  Administered 2022-11-05: 2 [IU] via SUBCUTANEOUS
  Administered 2022-11-05: 5 [IU] via SUBCUTANEOUS
  Administered 2022-11-06 – 2022-11-07 (×5): 2 [IU] via SUBCUTANEOUS
  Administered 2022-11-07: 3 [IU] via SUBCUTANEOUS
  Administered 2022-11-08: 2 [IU] via SUBCUTANEOUS
  Administered 2022-11-08 – 2022-11-10 (×6): 3 [IU] via SUBCUTANEOUS
  Administered 2022-11-10: 2 [IU] via SUBCUTANEOUS
  Administered 2022-11-10 – 2022-11-11 (×2): 3 [IU] via SUBCUTANEOUS
  Administered 2022-11-11: 2 [IU] via SUBCUTANEOUS
  Administered 2022-11-11: 3 [IU] via SUBCUTANEOUS
  Administered 2022-11-12: 1 [IU] via SUBCUTANEOUS
  Administered 2022-11-12: 3 [IU] via SUBCUTANEOUS
  Administered 2022-11-12 – 2022-11-13 (×2): 2 [IU] via SUBCUTANEOUS
  Administered 2022-11-13 – 2022-11-14 (×3): 3 [IU] via SUBCUTANEOUS
  Administered 2022-11-14: 2 [IU] via SUBCUTANEOUS
  Administered 2022-11-14: 3 [IU] via SUBCUTANEOUS
  Administered 2022-11-15 (×2): 2 [IU] via SUBCUTANEOUS
  Administered 2022-11-15: 3 [IU] via SUBCUTANEOUS
  Administered 2022-11-16: 1 [IU] via SUBCUTANEOUS
  Administered 2022-11-16: 2 [IU] via SUBCUTANEOUS
  Administered 2022-11-16: 1 [IU] via SUBCUTANEOUS
  Filled 2022-10-27: qty 0.09

## 2022-10-27 MED ORDER — HEPARIN (PORCINE) 25000 UT/250ML-% IV SOLN
2400.0000 [IU]/h | INTRAVENOUS | Status: AC
  Administered 2022-10-27 – 2022-10-29 (×4): 1850 [IU]/h via INTRAVENOUS
  Administered 2022-10-30: 2200 [IU]/h via INTRAVENOUS
  Administered 2022-10-30 (×2): 2000 [IU]/h via INTRAVENOUS
  Administered 2022-10-31: 2200 [IU]/h via INTRAVENOUS
  Filled 2022-10-27 (×7): qty 250

## 2022-10-27 MED ORDER — ACETAMINOPHEN 325 MG PO TABS
650.0000 mg | ORAL_TABLET | Freq: Four times a day (QID) | ORAL | Status: DC | PRN
  Administered 2022-10-30 – 2022-11-02 (×4): 650 mg via ORAL
  Filled 2022-10-27 (×4): qty 2

## 2022-10-27 MED ORDER — NALOXONE HCL 0.4 MG/ML IJ SOLN: 0.4000 mg | INTRAMUSCULAR | Status: DC | PRN

## 2022-10-27 MED ORDER — METRONIDAZOLE 500 MG/100ML IV SOLN
500.0000 mg | Freq: Once | INTRAVENOUS | Status: AC
  Administered 2022-10-27: 500 mg via INTRAVENOUS
  Filled 2022-10-27: qty 100

## 2022-10-27 MED ORDER — LACTATED RINGERS IV SOLN: INTRAVENOUS | Status: DC

## 2022-10-27 MED ORDER — METRONIDAZOLE 500 MG/100ML IV SOLN
500.0000 mg | Freq: Two times a day (BID) | INTRAVENOUS | Status: AC
  Administered 2022-10-28 – 2022-11-04 (×15): 500 mg via INTRAVENOUS
  Filled 2022-10-27 (×15): qty 100

## 2022-10-27 MED ORDER — TAMSULOSIN HCL 0.4 MG PO CAPS
0.4000 mg | ORAL_CAPSULE | Freq: Every day | ORAL | Status: DC
  Administered 2022-10-28 – 2022-11-16 (×20): 0.4 mg via ORAL
  Filled 2022-10-27 (×20): qty 1

## 2022-10-27 MED ORDER — ONDANSETRON HCL 4 MG/2ML IJ SOLN: 4.0000 mg | Freq: Four times a day (QID) | INTRAMUSCULAR | Status: DC | PRN

## 2022-10-27 MED ORDER — VANCOMYCIN HCL 2000 MG/400ML IV SOLN
2000.0000 mg | INTRAVENOUS | Status: AC
  Administered 2022-10-27: 2000 mg via INTRAVENOUS
  Filled 2022-10-27 (×2): qty 400

## 2022-10-27 MED ORDER — SODIUM CHLORIDE 0.9 % IV SOLN
2.0000 g | Freq: Three times a day (TID) | INTRAVENOUS | Status: AC
  Administered 2022-10-28 – 2022-11-04 (×23): 2 g via INTRAVENOUS
  Filled 2022-10-27 (×23): qty 12.5

## 2022-10-27 MED ORDER — SODIUM CHLORIDE 0.9 % IV SOLN
2.0000 g | Freq: Once | INTRAVENOUS | Status: AC
  Administered 2022-10-27: 2 g via INTRAVENOUS
  Filled 2022-10-27: qty 12.5

## 2022-10-27 MED ORDER — HEPARIN BOLUS VIA INFUSION
4000.0000 [IU] | Freq: Once | INTRAVENOUS | Status: AC
  Administered 2022-10-27: 4000 [IU] via INTRAVENOUS
  Filled 2022-10-27: qty 4000

## 2022-10-27 MED ORDER — FENTANYL CITRATE PF 50 MCG/ML IJ SOSY
50.0000 ug | PREFILLED_SYRINGE | INTRAMUSCULAR | Status: DC | PRN
  Administered 2022-11-01: 50 ug via INTRAVENOUS
  Filled 2022-10-27: qty 1

## 2022-10-27 MED ORDER — VANCOMYCIN HCL IN DEXTROSE 1-5 GM/200ML-% IV SOLN: 1000.0000 mg | Freq: Once | INTRAVENOUS | Status: DC

## 2022-10-27 MED ORDER — PANTOPRAZOLE SODIUM 40 MG PO TBEC
40.0000 mg | DELAYED_RELEASE_TABLET | Freq: Every day | ORAL | Status: DC
  Administered 2022-10-28 – 2022-11-16 (×20): 40 mg via ORAL
  Filled 2022-10-27 (×20): qty 1

## 2022-10-27 MED ORDER — VANCOMYCIN HCL 2000 MG/400ML IV SOLN
2000.0000 mg | INTRAVENOUS | Status: DC
  Administered 2022-10-29 – 2022-11-04 (×7): 2000 mg via INTRAVENOUS
  Filled 2022-10-27 (×8): qty 400

## 2022-10-27 NOTE — ED Provider Triage Note (Signed)
Emergency Medicine Provider Triage Evaluation Note  [name] , a 71 y.o. male  was evaluated in triage.  Pt complains of left foot pain, redness, and swelling to the point where he cannot place weight on it.  He has wounds on his calf as well on the top of his foot.   He has lymphedema and a sclerotic skin condition on his lower legs that is normal for him.   He denies any fevers, numbness, or tingling of ext.   Review of Systems  Positive:  Negative:   Physical Exam  BP 137/73 (BP Location: Left Arm)   Pulse 79   Temp 98.5 F (36.9 C) (Oral)   Resp 18   Ht '6\' 3"'$  (1.905 m)   Wt (!) 138 kg   SpO2 100%   BMI 38.03 kg/m  Gen:   Awake, no distress   Resp:  Normal effort  MSK:   Moves extremities without difficulty  Other:  Left foot warm to touch, extremely swollen and erythematous. Wound noted on top of foot. Wound is weeping. Pulse intact.   Medical Decision Making  Medically screening exam initiated at 1:41 PM.  Appropriate orders placed.  Delanna Notice was informed that the remainder of the evaluation will be completed by another provider, this initial triage assessment does not replace that evaluation, and the importance of remaining in the ED until their evaluation is complete.     Adolphus Birchwood, PA-C 10/27/22 1343

## 2022-10-27 NOTE — ED Triage Notes (Signed)
Pt resides at [facility].  BIBA for redness, heat, swelling to left foot x1 day. Wound noted.  Hx lymphedema and on lasix Denies drainage

## 2022-10-27 NOTE — Progress Notes (Addendum)
Pharmacy Antibiotic Note + IV heparin for afib  [name] is a 71 y.o. male admitted on 10/27/2022 with cellulitis.  Pharmacy has been consulted for Vanco, Cefepime dosing.  Active Problem(s):  redness, heat, swelling to left foot x1 day. Wound noted.   PMH: DM, lymphedema, afib  AC/Heme: Eliquis PTA for afib (last dose    ) >>IV heparin for possible surgery). Hgb 10.1. Plts WNL   ID: L foot Cellulitis, r/o osteo - Afebrile. wBC 4.9. Scr 1.27  Cefepime 11/15>> Vanco 11/15>>  Plan: Cefepime 2g IV q8hr Vanco 2g IV x 1 ordered in ED Vancomycin 2000 mg IV Q 24 hrs. Goal AUC 400-550. Expected AUC: 505 SCr used: 1.27  IV heparin 4000 unit IV bolus Heparin infusion at 1850 units/hr Check HL and aPTT in 6-8 hrs. Daily aPTT, HL, and CBC   Height: '6\' 3"'$  (190.5 cm) Weight: (!) 138 kg (304 lb 3.8 oz) IBW/kg (Calculated) : 84.5  Temp (24hrs), Avg:98.3 F (36.8 C), Min:98.1 F (36.7 C), Max:98.5 F (36.9 C)  Recent Labs  Lab 10/27/22 1338 10/27/22 1510  WBC 4.9  --   CREATININE 1.27*  --   LATICACIDVEN  --  1.0    Estimated Creatinine Clearance: 79.9 mL/min (A) (by C-G formula based on SCr of 1.27 mg/dL (H)).    Allergies  Allergen Reactions   Other     [name] bandage (self-gripping) - erythema, itching    [name] S. Alford Highland, PharmD, BCPS Clinical Staff Pharmacist [email or web address]  [name] 10/27/2022 8:10 PM

## 2022-10-27 NOTE — ED Provider Notes (Signed)
[facility] DEPT Provider Note   CSN: [id number] Arrival date & time: 10/27/22  1306     History {Add pertinent medical, surgical, social history, OB history to HPI:1} Chief Complaint  Patient presents with   Foot Pain    [name] is a 71 y.o. male.   Foot Pain  Patient presents for left foot pain.  Medical history includes***.     Home Medications Prior to Admission medications   Medication Sig Start Date End Date Taking? Authorizing Provider  apixaban (ELIQUIS) 5 MG TABS tablet Take 1 tablet (5 mg total) by mouth 2 (two) times daily. 02/02/22   Loel Dubonnet, NP  atorvastatin (LIPITOR) 20 MG tablet Take 20 mg by mouth daily. 02/02/21   [provider]  carbamide peroxide (DEBROX) 6.5 % OTIC solution Place 5 drops into the right ear 2 (two) times daily. Patient not taking: Reported on 09/10/2022 12/25/21   Swayze, Ava, DO  cyanocobalamin 1000 MCG tablet Take 1 tablet (1,000 mcg total) by mouth daily. 09/15/22   Mercy Riding, MD  dextrose (GLUTOSE) 40 % GEL Take 1 Tube by mouth once as needed for low blood sugar. Give Glucose Gel by mouth for BS<70. Repeat CBG in 16 minutes, Repeat Gel & Recheck CBG in 15 minutes. Call MD if Deemed Necessary. Call 911 if Resident is Symptomatic, Not Alert or Unconscious    [provider]  docusate sodium (COLACE) 100 MG capsule Take 1 capsule (100 mg total) by mouth 2 (two) times daily. 12/08/16   Barton Dubois, MD  folic acid (FOLVITE) 1 MG tablet Take 1 mg by mouth See admin instructions. '1mg'$  everyday EXCEPT none on Fridays. 01/29/21   [provider]  furosemide (LASIX) 80 MG tablet Take 80 mg by mouth daily. 08/26/22   [provider]  hydrOXYzine (ATARAX/VISTARIL) 25 MG tablet Take 1 tablet (25 mg total) by mouth every 8 (eight) hours as needed for itching. Use caution as this medication may make you drowsy 07/17/20   Little, Wenda Overland, MD  JANUVIA 100 MG tablet Take  100 mg by mouth daily. 01/20/21   [provider]  metFORMIN (GLUCOPHAGE) 1000 MG tablet Take 1,000 mg by mouth 2 (two) times daily with a meal. 01/23/21   [provider]  methotrexate (RHEUMATREX) 2.5 MG tablet Take 10 mg by mouth once a week. Takes on Fridays. 01/29/21   [provider]  oxyCODONE (OXY IR/ROXICODONE) 5 MG immediate release tablet Take 1 tablet (5 mg total) by mouth every 6 (six) hours as needed for severe pain. 09/13/22   Meuth, Brooke A, PA-C  pantoprazole (PROTONIX) 40 MG tablet Take 1 tablet (40 mg total) by mouth daily. 12/26/21   Swayze, Ava, DO  polyethylene glycol (MIRALAX) packet Take 17 g by mouth daily. Hold for diarrhea 12/08/16   Barton Dubois, MD  potassium chloride SA (KLOR-CON M) 20 MEQ tablet Take 20 mEq by mouth daily. 08/26/22   [provider]  PRESCRIPTION MEDICATION Apply 1 application topically daily. Tac 0.1% Cetaphil 1:4 Apply to lower legs    [provider]  senna-docusate (SENOKOT-S) 8.6-50 MG tablet Take 1 tablet by mouth 2 (two) times daily between meals as needed for moderate constipation. 09/13/22   Mercy Riding, MD  tamsulosin (FLOMAX) 0.4 MG CAPS capsule Take 1 capsule (0.4 mg total) by mouth daily. 11/27/16   Barton Dubois, MD  terbinafine (LAMISIL) 1 % cream Apply topically 2 (two) times daily. 09/13/22  Mercy Riding, MD  triamcinolone (KENALOG) 0.1 % Apply 1 application topically See admin instructions. 1 application topical twice daily to itchy areas And 1 extra application daily as needed for dermatitis    [provider]      Allergies    Other    Review of Systems   Review of Systems  Physical Exam Updated Vital Signs BP 128/64   Pulse 62   Temp 98.3 F (36.8 C) (Oral)   Resp 17   Ht '6\' 3"'$  (1.905 m)   Wt (!) 138 kg   SpO2 100%   BMI 38.03 kg/m  Physical Exam  ED Results / Procedures / Treatments   Labs (all labs ordered are listed, but only abnormal results are  displayed) Labs Reviewed  COMPREHENSIVE METABOLIC PANEL - Abnormal; Notable for the following components:      Result Value   Chloride 97 (*)    Glucose, Bld 117 (*)    Creatinine, Ser 1.27 (*)    Calcium 8.7 (*)    Total Protein 8.6 (*)    All other components within normal limits  CBC WITH DIFFERENTIAL/PLATELET - Abnormal; Notable for the following components:   RBC 2.94 (*)    Hemoglobin 10.1 (*)    HCT 31.0 (*)    MCV 105.4 (*)    MCH 34.4 (*)    RDW 17.4 (*)    Lymphs Abs 0.4 (*)    All other components within normal limits  CULTURE, BLOOD (ROUTINE X 2)  CULTURE, BLOOD (ROUTINE X 2)  LACTIC ACID, PLASMA    EKG None  Radiology DG Foot Complete Left  Result Date: 10/27/2022 CLINICAL DATA:  Open wound and foot pain. EXAM: LEFT FOOT - COMPLETE 3+ VIEW COMPARISON:  None Available. FINDINGS: Large area of destructive bony change involving the base of the fifth metatarsal. Possible pathologic avulsion fracture. The other metatarsals appear intact and the cuboid appears intact. Diffuse subcutaneous soft tissue swelling/edema suggesting cellulitis. No gas is seen in the soft tissues. IMPRESSION: 1. Large area of destructive bony change involving the base of the fifth metatarsal consistent with osteomyelitis. Possible pathologic avulsion fracture. 2. Diffuse subcutaneous soft tissue swelling/edema suggesting cellulitis. Electronically Signed   By: Marijo Sanes M.D.   On: 10/27/2022 14:10    Procedures Procedures  {Document cardiac monitor, telemetry assessment procedure when appropriate:1}  Medications Ordered in ED Medications - No data to display  ED Course/ Medical Decision Making/ A&P                           Medical Decision Making  This patient presents to the ED for concern of ***, this involves an extensive number of treatment options, and is a complaint that carries with it a high risk of complications and morbidity.  The differential diagnosis includes ***   Co  morbidities that complicate the patient evaluation  ***   Additional history obtained:  Additional history obtained from *** External records from outside source obtained and reviewed including ***   Lab Tests:  I Ordered, and personally interpreted labs.  The pertinent results include:  ***   Imaging Studies ordered:  I ordered imaging studies including ***  I independently visualized and interpreted imaging which showed *** I agree with the radiologist interpretation   Cardiac Monitoring: / EKG:  The patient was maintained on a cardiac monitor.  I personally viewed and interpreted the cardiac monitored which showed an underlying rhythm  of: ***   Consultations Obtained:  I requested consultation with the ***,  and discussed lab and imaging findings as well as pertinent plan - they recommend: ***   Problem List / ED Course / Critical interventions / Medication management  *** I ordered medication including ***  for ***  Reevaluation of the patient after these medicines showed that the patient {resolved/improved/worsened:23923::"improved"} I have reviewed the patients home medicines and have made adjustments as needed   Social Determinants of Health:  ***   Test / Admission - Considered:  ***   {Document critical care time when appropriate:1} {Document review of labs and clinical decision tools ie heart score, Chads2Vasc2 etc:1}  {Document your independent review of radiology images, and any outside records:1} {Document your discussion with family members, caretakers, and with consultants:1} {Document social determinants of health affecting pt's care:1} {Document your decision making why or why not admission, treatments were needed:1} Final Clinical Impression(s) / ED Diagnoses Final diagnoses:  None    Rx / DC Orders ED Discharge Orders     None

## 2022-10-27 NOTE — Progress Notes (Signed)
When answering SDOH questions, [name] states the facility he is at has bed bugs and that he killed a couple crawling on his arm last night. [name]'s ER RN Demetrius Revel in room when [name] is talking about this. He also states that the facility  he is staying at is doing everything possible to get rid of them and he does not want facility to get in trouble. States they come and exterminate immediately when there is a problem but they are unable to replace all of the furniture. [name] states the bed bugs have laid eggs in the furniture. [name] also states he has some issues with transportation. He states he is able to get to the doctor and get his medications. He states facility only will take each person to the store once or twice per month and he needs more transportation. Order put in for [facility] to assist patient.  Doroteo Bradford BSN, RN-BC Throughput Nurse 10/27/2022 8:52 PM

## 2022-10-27 NOTE — H&P (Signed)
History and Physical      [name]:[id number] DOB: [birth year] DOA: 10/27/2022  PCP: Sandi Mariscal, MD  Patient coming from: home   I have personally briefly reviewed patient's old medical records in [place]  Chief Complaint: Left foot pain  HPI: [name] is a 71 y.o. male with medical history significant for type 2 diabetes mellitus, paroxysmal atrial fibrillation chronically anticoagulated on Eliquis, anemia of chronic disease associated baseline hemoglobin range 9-11, who is admitted to [facility] on 10/27/2022 with cellulitis of the left foot after presenting from home to [facility] ED complaining of left foot pain.   The patient reports 2 days of progressive left foot discomfort associated with the dorsum of the foot.  He also first noted erythema along with new onset purulent drainage from the dorsum of the left foot starting 1 day ago, although he is unsure as to the precise duration of the erythema/purulent drainage as he had a sock covering the left foot for at least the preceding 24 hours leading up to first noting the erythema/purulent drainage yesterday.  Denies any new onset erythema in any additional location.  Reports associated mild swelling associate with the left foot.  No new onset left foot paresthesias/numbness.  No new onset calf tenderness.  No recent subjective fever, chills, rigors, or generalized myalgias.  Reports a degree of chronic discomfort associate the left foot, for which he intermittently follows with Dr. Cay Schillings Of orthopedic surgery.  Per patient, most recent prior MRI of the left foot performed a few months ago by orthopedic surgery showed evidence of subacute versus chronic fifth metatarsal fracture.  No reported recent trauma to the left lower extremity.  Per chart review, there is documentation of a history of venous stasis ulcers involving the bilateral lower extremities.  Of note, he is chronically anticoagulated on Eliquis in the  setting of a history of paroxysmal atrial fibrillation.  Most recent echocardiogram was performed on 09/10/2022 and was notable for the following: LVEF 60 to 65%, no focal motion rallies, mild LVH, normal left ventricular diastolic parameters, normal right ventricular systolic function, moderately dilated left atrium and trivial mitral regurgitation.  He is on daily Lasix as an outpatient.  Additionally, he was hospitalized in the [facility] health system at the end of September 2023 for acute cholecystitis, and subsequent underwent laparoscopic cholecystectomy on 09/11/2022.  Appears diagnosis of underlying diabetes, with most recent hemoglobin A1c found to be 5.3% on 09/09/2022.     ED Course:  Vital signs in the ED were notable for the following: Afebrile; heart rate 15-40; systolic blood pressures in the 120s to 130s; respiratory rate 17-18, oxygen saturation 100% on room air.  Labs were notable for the following: CMP notable for creatinine 1.27 compared to most recent prior value of 1.38 on 09/13/2022, calcium adjusted for mild hypoalbuminemia noted to be 9.1, avidin 3.6, otherwise liver enzymes within normal limits.  AK-Tate 1.0.  CBC notable for white cell count 4900 with 82% neutrophils, hemoglobin 10.1 compared to 7.8 on 09/13/2022, platelet count 218.  ESR/CRP ordered, with results currently pending.  Blood cultures x2 collected prior to initiation of IV antibiotics.  Imaging and additional notable ED work-up: Plain films of the left foot, per radiology interpretation, show subcutaneous soft tissue swelling/edema consistent with cellulitis, also showing evidence concerning for bony destructive changes involving the base of the fifth metatarsal, concerning for osteomyelitis, in the absence of any evidence of subcutaneous gas.  MRI of the left  foot ordered, with result currently pending.  While in the ED, the following were administered: Percocet 5/325 mg p.o. x1 dose, cefepime, IV vancomycin, IV  Flagyl.  Lactated Ringer's at 150 cc/h.  Subsequently, the patient was admitted for further evaluation management of cellulitis of the left foot, with concern for potential underlying osteomyelitis, warranting further evaluation thereof.    Review of Systems: As per HPI otherwise 10 point review of systems negative.   Past Medical History:  Diagnosis Date   Adenocarcinoma of colon [facility])    Atrial fibrillation ([place])    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   Cataract    Constipation, chronic 11/09/2016   Diabetes mellitus without complication ([place])    History of COVID-19 09/09/2022   History of homelessness 09/09/2022   History of kidney stones 09/09/2022   History of Stercoral ulcer of rectum 11/28/2016   Hyperlipidemia    Hypertension    Lichen planus    bilateral legs   Nausea & vomiting 09/09/2022   Obesity (BMI 30-39.9) 09/09/2022   Personal history of cecal colon cancer 09/09/2022    Past Surgical History:  Procedure Laterality Date   APPENDECTOMY     CHOLECYSTECTOMY N/A 09/11/2022   Procedure: LAPAROSCOPIC CHOLECYSTECTOMY;  Surgeon: Coralie Keens, MD;  Location: [facility] ORS;  Service: General;  Laterality: N/A;   COLON RESECTION N/A 11/22/2016   Procedure: HAND ASSISTED LAPAROSCOPIC COLON RESECTION;  Surgeon: [name], MD;  Location: [facility];  Service: General;  Laterality: N/A;   COLONOSCOPY N/A 11/17/2016   Procedure: COLONOSCOPY;  Surgeon: [name], MD;  Location: [facility] ENDOSCOPY;  Service: Endoscopy;  Laterality: N/A;   FRACTURE SURGERY     MULTIPLE TOOTH EXTRACTIONS     RECTAL EXAM UNDER ANESTHESIA N/A 12/05/2016   Procedure: RECTAL EXAM UNDER ANESTHESIA, DISIMPACTION;  Surgeon: Alphonsa Overall, MD;  Location: [facility] ORS;  Service: General;  Laterality: N/A;    Social History:  reports that he quit smoking about 39 years ago. His smoking use included pipe. He has never used smokeless tobacco. He reports that he does not drink alcohol and does  not use drugs.   Allergies  Allergen Reactions   Other     [name] bandage (self-gripping) - erythema, itching    Family History  Problem Relation Age of Onset   Brain cancer Mother    Alzheimer's disease Father    Cancer Maternal Grandmother    Heart attack Maternal Grandfather    Pneumonia Paternal Grandfather     Family history reviewed and not pertinent    Prior to Admission medications   Medication Sig Start Date End Date Taking? Authorizing Provider  apixaban (ELIQUIS) 5 MG TABS tablet Take 1 tablet (5 mg total) by mouth 2 (two) times daily. 02/02/22   Loel Dubonnet, NP  atorvastatin (LIPITOR) 20 MG tablet Take 20 mg by mouth daily. 02/02/21   [provider]  carbamide peroxide (DEBROX) 6.5 % OTIC solution Place 5 drops into the right ear 2 (two) times daily. Patient not taking: Reported on 09/10/2022 12/25/21   Swayze, Ava, DO  cyanocobalamin 1000 MCG tablet Take 1 tablet (1,000 mcg total) by mouth daily. 09/15/22   Mercy Riding, MD  dextrose (GLUTOSE) 40 % GEL Take 1 Tube by mouth once as needed for low blood sugar. Give Glucose Gel by mouth for BS<70. Repeat CBG in 16 minutes, Repeat Gel & Recheck CBG in 15 minutes. Call MD if Deemed Necessary. Call 911 if Resident is  Symptomatic, Not Alert or Unconscious    [provider]  docusate sodium (COLACE) 100 MG capsule Take 1 capsule (100 mg total) by mouth 2 (two) times daily. 12/08/16   Barton Dubois, MD  folic acid (FOLVITE) 1 MG tablet Take 1 mg by mouth See admin instructions. 37m everyday EXCEPT none on Fridays. 01/29/21   [provider]  furosemide (LASIX) 80 MG tablet Take 80 mg by mouth daily. 08/26/22   [provider]  hydrOXYzine (ATARAX/VISTARIL) 25 MG tablet Take 1 tablet (25 mg total) by mouth every 8 (eight) hours as needed for itching. Use caution as this medication may make you drowsy 07/17/20   Little, RWenda Overland MD  JANUVIA 100 MG tablet Take 100 mg by mouth  daily. 01/20/21   [provider]  metFORMIN (GLUCOPHAGE) 1000 MG tablet Take 1,000 mg by mouth 2 (two) times daily with a meal. 01/23/21   [provider]  methotrexate (RHEUMATREX) 2.5 MG tablet Take 10 mg by mouth once a week. Takes on Fridays. 01/29/21   [provider]  oxyCODONE (OXY IR/ROXICODONE) 5 MG immediate release tablet Take 1 tablet (5 mg total) by mouth every 6 (six) hours as needed for severe pain. 09/13/22   Meuth, Brooke A, PA-C  pantoprazole (PROTONIX) 40 MG tablet Take 1 tablet (40 mg total) by mouth daily. 12/26/21   Swayze, Ava, DO  polyethylene glycol (MIRALAX) packet Take 17 g by mouth daily. Hold for diarrhea 12/08/16   MBarton Dubois MD  potassium chloride SA (KLOR-CON M) 20 MEQ tablet Take 20 mEq by mouth daily. 08/26/22   [provider]  PRESCRIPTION MEDICATION Apply 1 application topically daily. Tac 0.1% Cetaphil 1:4 Apply to lower legs    [provider]  senna-docusate (SENOKOT-S) 8.6-50 MG tablet Take 1 tablet by mouth 2 (two) times daily between meals as needed for moderate constipation. 09/13/22   GMercy Riding MD  tamsulosin (FLOMAX) 0.4 MG CAPS capsule Take 1 capsule (0.4 mg total) by mouth daily. 11/27/16   MBarton Dubois MD  terbinafine (LAMISIL) 1 % cream Apply topically 2 (two) times daily. 09/13/22   GMercy Riding MD  triamcinolone (KENALOG) 0.1 % Apply 1 application topically See admin instructions. 1 application topical twice daily to itchy areas And 1 extra application daily as needed for dermatitis    [provider]     Objective    Physical Exam: Vitals:   10/27/22 1330 10/27/22 1540 10/27/22 1734 10/27/22 1912  BP:  131/62 128/64 (!) 125/44  Pulse:  75 62 61  Resp:  _0 Temp:   98.3 F (36.8 C) 98.1 F (36.7 C)  TempSrc:   Oral Oral  SpO2:  100% 100% 100%  Weight: (!) 138 kg     Height: _1  (1.905 m)       General: appears to be stated age; alert, oriented Skin: warm;  erythema on the dorsal surface of the left foot associated with increased warmth, mild swelling, purulent drainage, tenderness, in the absence of any associated crepitus Head:  AT/[place] Mouth:  Oral mucosa membranes appear dry, normal dentition Neck: supple; trachea midline Heart:  RRR; did not appreciate any M/R/G Lungs: CTAB, did not appreciate any wheezes, rales, or rhonchi Abdomen: + BS; soft, ND, NT Vascular: 2+ pedal pulses b/l; 2+ radial pulses b/l Extremities: no muscle wasting; erythema with associated findings on the dorsal surface of the left foot, as further detailed above Neuro: strength and sensation intact  in upper and lower extremities b/l    Labs on Admission: I have personally reviewed following labs and imaging studies  CBC: Recent Labs  Lab 10/27/22 1338  WBC 4.9  NEUTROABS 4.0  HGB 10.1*  HCT 31.0*  MCV 105.4*  PLT 119   Basic Metabolic Panel: Recent Labs  Lab 10/27/22 1338  NA 136  K 3.7  CL 97*  CO2 29  GLUCOSE 117*  BUN 20  CREATININE 1.27*  CALCIUM 8.7*   GFR: Estimated Creatinine Clearance: 79.9 mL/min (A) (by C-G formula based on SCr of 1.27 mg/dL (H)). Liver Function Tests: Recent Labs  Lab 10/27/22 1338  AST 15  ALT 10  ALKPHOS 81  BILITOT 0.8  PROT 8.6*  ALBUMIN 3.6   No results for input(s): "LIPASE", "AMYLASE" in the last 168 hours. No results for input(s): "AMMONIA" in the last 168 hours. Coagulation Profile: No results for input(s): "INR", "PROTIME" in the last 168 hours. Cardiac Enzymes: No results for input(s): "CKTOTAL", "CKMB", "CKMBINDEX", "TROPONINI" in the last 168 hours. BNP (last 3 results) No results for input(s): "PROBNP" in the last 8760 hours. HbA1C: No results for input(s): "HGBA1C" in the last 72 hours. CBG: No results for input(s): "GLUCAP" in the last 168 hours. Lipid Profile: No results for input(s): "CHOL", "HDL", "LDLCALC", "TRIG", "CHOLHDL", "LDLDIRECT" in the last 72 hours. Thyroid Function  Tests: No results for input(s): "TSH", "T4TOTAL", "FREET4", "T3FREE", "THYROIDAB" in the last 72 hours. Anemia Panel: No results for input(s): "VITAMINB12", "FOLATE", "FERRITIN", "TIBC", "IRON", "RETICCTPCT" in the last 72 hours. Urine analysis:    Component Value Date/Time   COLORURINE YELLOW 09/09/2022 1051   APPEARANCEUR CLEAR 09/09/2022 1051   LABSPEC 1.008 09/09/2022 1051   PHURINE 6.0 09/09/2022 1051   GLUCOSEU NEGATIVE 09/09/2022 1051   HGBUR NEGATIVE 09/09/2022 1051   BILIRUBINUR NEGATIVE 09/09/2022 1051   BILIRUBINUR NEG 05/27/2013 1516   KETONESUR NEGATIVE 09/09/2022 1051   PROTEINUR NEGATIVE 09/09/2022 1051   UROBILINOGEN 0.2 05/27/2013 1516   NITRITE NEGATIVE 09/09/2022 1051   LEUKOCYTESUR NEGATIVE 09/09/2022 1051    Radiological Exams on Admission: DG Foot Complete Left  Result Date: 10/27/2022 CLINICAL DATA:  Open wound and foot pain. EXAM: LEFT FOOT - COMPLETE 3+ VIEW COMPARISON:  None Available. FINDINGS: Large area of destructive bony change involving the base of the fifth metatarsal. Possible pathologic avulsion fracture. The other metatarsals appear intact and the cuboid appears intact. Diffuse subcutaneous soft tissue swelling/edema suggesting cellulitis. No gas is seen in the soft tissues. IMPRESSION: 1. Large area of destructive bony change involving the base of the fifth metatarsal consistent with osteomyelitis. Possible pathologic avulsion fracture. 2. Diffuse subcutaneous soft tissue swelling/edema suggesting cellulitis. Electronically Signed   By: Marijo Sanes M.D.   On: 10/27/2022 14:10      Assessment/Plan    Principal Problem:   Cellulitis of left foot Active Problems:   Paroxysmal atrial fibrillation (HCC)   HLD (hyperlipidemia)   DM2 (diabetes mellitus, type 2) (HCC)   BPH (benign prostatic hyperplasia)   Anemia of chronic disease       #) Cellulitis of the left foot: Diagnosis on the basis of patient's report of 2 days of progressive  left foot pain associated with the dorsum and associated with increased warmth, erythema, purulent drainage, with plain films of the left foot from this evening demonstrating evidence of soft tissue swelling/edema consistent with cellulitis, without any evidence of subcutaneous gas.  Additionally, no crepitus on physical exam.  Therefore, no clinical or  radiographic evidence to suggest necrotizing fasciitis at this time.  However, symptoms of left foot concerning for the possibility of underlying osteomyelitis given the potential of destructive changes involving the base of the left metatarsal.  MRI left foot currently pending to evaluate this possibility.  In the setting of cellulitis of the left foot in this patient with underlying diabetes, with the possibility of underlying osteomyelitis, we will continue broad-spectrum IV antibiotics that include empiric coverage of osteomyelitis, including IV vancomycin, cefepime.  In this context also continue IV Flagyl, and monitor for results of MRI left, with plan to de-escalate IV antibiotics if no evidence of underlying osteomyelitis.  Additionally, consultation of orthopedic surgery may be warranted depending upon the results of this MRI of the left foot, noting that the patient is established with Dr. Cay Schillings as his outpatient orthopedic physician.   Of note, in the absence of any surgical tear, criteria for sepsis not currently met.  Blood cultures x2 were collected prior to initiation of the above IV antibiotics.  In terms of respecters for development of cellulitis of the left foot, the patient's underlying history of diabetes that is noted.  He also has a documented history of recurrent venous stasis ulcers involving the bilateral lower extremities, without any corresponding evidence on most recent echocardiogram of heart failure, including no evidence of right-sided heart failure, as further detailed above.   Plan: Follow-up results of blood cultures x2.   Continue IV vancomycin, cefepime, and IV Flagyl, as above.  Follow-up result of CRP and ESR.  Follow-up result of MRI of the left foot.  RN communication order placed, requesting elevation of the left foot.  Repeat CBC with differential in the morning.  Prn IV fentanyl.  Given the possibility of requiring surgery for potential osteomyelitis to his hospitalization, transiently requested conversion of Eliquis to heparin drip via inpatient pharmacy consultation, while awaiting results of MRI left foot.  Check INR in the morning.  Check EKG.              #) Type 2 Diabetes Mellitus: documented history of such. Home insulin regimen: None. Home oral hypoglycemic agents: Januvia, metformin. presenting blood sugar: 117. Most recent A1c noted to be 5.3% when checked on 09/09/2022.   Plan: accuchecks QAC and HS with low dose SSI. hold home oral hypoglycemic agents during this hospitalization.             #) Paroxysmal atrial fibrillation: Documented history of such. In setting of CHA2DS2-VASc score of 4, there is an indication for chronic anticoagulation for thromboembolic prophylaxis. Consistent with this, patient is chronically anticoagulated on Eliquis. Home AV nodal blocking regimen: None.  Most recent echocardiogram was performed in September 2023, with results as detailed above.  Per telemetry, appears to be in sinus rhythm at this time.  Given the possibility of immediate neurosurgical intervention regarding potential osteomyelitis of the left foot, with further evaluation thereof currently pending, will transiently convert from Eliquis to heparin drip, as further detailed above.   Plan: monitor strict I's & O's and daily weights. CMP/CBC in AM. Check serum mag level.  Inpatient pharmacy consult placed for heparin drip, as above.  Holding home Eliquis for now. EKG ordered.          #) Benign Prostatic Hyperplasia:  documented h/o such; on tamsulosin as outpatient.   Plan:  monitor strict I's & O's and daily weights. Repeat BMP in AM.  continue home tamsulosin.              #)  Hyperlipidemia: documented h/o such. On atorvastatin as outpatient.    Plan: continue home statin.            #) Anemia of chronic disease: Documented history of such, a/w with baseline hgb range 9-11, with presenting hgb consistent with this range, in the absence of any overt evidence of active bleed.     Plan: Repeat CBC in the morning.  Check INR.  Given the possibility of requiring surgical intervention for potential left foot osteomyelitis, also place order for type and screen.         DVT prophylaxis: SCD's + heparin drip (holding home Eliquis for now, as further detailed above) Code Status: Full code Family Communication: none Disposition Plan: Per Rounding Team Consults called: none;  Admission status: Inpatient     Clayton DO [facility] [place]   10/27/2022, 8:00 PM

## 2022-10-27 NOTE — Progress Notes (Signed)
A consult was received from an ED physician for Vanco, Cefepime per pharmacy dosing.  The patient's profile has been reviewed for ht/wt/allergies/indication/available labs.   A one time order has been placed for Vanco 2g IV x 1 and Cefepime 2g IV x 1.  Further antibiotics/pharmacy consults should be ordered by admitting physician if indicated.         Braylin Xu S. Alford Highland, PharmD, BCPS Clinical Staff Pharmacist [email or web address]                Thank you, Wayland Salinas 10/27/2022  7:31 PM

## 2022-10-27 NOTE — Progress Notes (Signed)
ED tech informed primary RN that pt has bed bugs after pt placed in room. Contact isolations initiated. Upon initial assessment by RN, no bed bugs noted. Primary NT assisted patient in removal of clothes from home and placed in belongings bags. Linen disposed of properly. NT reported to primary RN that no bed bugs were noted when pt placed in gown. Plan of care ongoing.

## 2022-10-28 DIAGNOSIS — L03116 Cellulitis of left lower limb: Secondary | ICD-10-CM | POA: Diagnosis not present

## 2022-10-28 LAB — MRSA NEXT GEN BY PCR, NASAL: MRSA by PCR Next Gen: NOT DETECTED

## 2022-10-28 LAB — COMPREHENSIVE METABOLIC PANEL
ALT: 10 U/L (ref 0–44)
AST: 13 U/L — ABNORMAL LOW (ref 15–41)
Albumin: 2.8 g/dL — ABNORMAL LOW (ref 3.5–5.0)
Alkaline Phosphatase: 69 U/L (ref 38–126)
Anion gap: 8 (ref 5–15)
BUN: 19 mg/dL (ref 8–23)
CO2: 26 mmol/L (ref 22–32)
Calcium: 8.3 mg/dL — ABNORMAL LOW (ref 8.9–10.3)
Chloride: 102 mmol/L (ref 98–111)
Creatinine, Ser: 1.28 mg/dL — ABNORMAL HIGH (ref 0.61–1.24)
GFR, Estimated: 60 mL/min — ABNORMAL LOW (ref >60)
Glucose, Bld: 187 mg/dL — ABNORMAL HIGH (ref 70–99)
Potassium: 3.5 mmol/L (ref 3.5–5.1)
Sodium: 136 mmol/L (ref 135–145)
Total Bilirubin: 0.5 mg/dL (ref 0.3–1.2)
Total Protein: 7.1 g/dL (ref 6.5–8.1)

## 2022-10-28 LAB — CBC WITH DIFFERENTIAL/PLATELET
Abs Immature Granulocytes: 0 10*3/uL (ref 0.00–0.07)
Basophils Absolute: 0 10*3/uL (ref 0.0–0.1)
Basophils Relative: 1 %
Eosinophils Absolute: 0.3 10*3/uL (ref 0.0–0.5)
Eosinophils Relative: 8 %
HCT: 26.2 % — ABNORMAL LOW (ref 39.0–52.0)
Hemoglobin: 8.4 g/dL — ABNORMAL LOW (ref 13.0–17.0)
Immature Granulocytes: 0 %
Lymphocytes Relative: 12 %
Lymphs Abs: 0.4 10*3/uL — ABNORMAL LOW (ref 0.7–4.0)
MCH: 34.1 pg — ABNORMAL HIGH (ref 26.0–34.0)
MCHC: 32.1 g/dL (ref 30.0–36.0)
MCV: 106.5 fL — ABNORMAL HIGH (ref 80.0–100.0)
Monocytes Absolute: 0.2 10*3/uL (ref 0.1–1.0)
Monocytes Relative: 5 %
Neutro Abs: 2.7 10*3/uL (ref 1.7–7.7)
Neutrophils Relative %: 74 %
Platelets: 165 10*3/uL (ref 150–400)
RBC: 2.46 MIL/uL — ABNORMAL LOW (ref 4.22–5.81)
RDW: 17.7 % — ABNORMAL HIGH (ref 11.5–15.5)
WBC: 3.6 10*3/uL — ABNORMAL LOW (ref 4.0–10.5)
nRBC: 0 % (ref 0.0–0.2)

## 2022-10-28 LAB — C-REACTIVE PROTEIN: CRP: 3.4 mg/dL — ABNORMAL HIGH (ref <1.0)

## 2022-10-28 LAB — PROTIME-INR
INR: 1.3 — ABNORMAL HIGH (ref 0.8–1.2)
Prothrombin Time: 15.6 seconds — ABNORMAL HIGH (ref 11.4–15.2)

## 2022-10-28 LAB — GLUCOSE, CAPILLARY
Glucose-Capillary: 150 mg/dL — ABNORMAL HIGH (ref 70–99)
Glucose-Capillary: 172 mg/dL — ABNORMAL HIGH (ref 70–99)
Glucose-Capillary: 176 mg/dL — ABNORMAL HIGH (ref 70–99)
Glucose-Capillary: 151 mg/dL — ABNORMAL HIGH (ref 70–99)

## 2022-10-28 LAB — MAGNESIUM: Magnesium: 1.7 mg/dL (ref 1.7–2.4)

## 2022-10-28 LAB — APTT
aPTT: 90 seconds — ABNORMAL HIGH (ref 24–36)
aPTT: 77 seconds — ABNORMAL HIGH (ref 24–36)

## 2022-10-28 LAB — HEPARIN LEVEL (UNFRACTIONATED): Heparin Unfractionated: >1.1 IU/mL — ABNORMAL HIGH (ref 0.30–0.70)

## 2022-10-28 MED ORDER — FUROSEMIDE 40 MG PO TABS
80.0000 mg | ORAL_TABLET | Freq: Every day | ORAL | Status: DC
  Administered 2022-10-29 – 2022-11-08 (×10): 80 mg via ORAL
  Filled 2022-10-28 (×10): qty 2

## 2022-10-28 MED ORDER — FUROSEMIDE 40 MG PO TABS: 80.0000 mg | ORAL_TABLET | Freq: Every day | ORAL | Status: DC

## 2022-10-28 NOTE — Progress Notes (Signed)
ANTICOAGULATION CONSULT NOTE  Pharmacy Consult for heparin Indication: atrial fibrillation  Allergies  Allergen Reactions   Other Itching and Other (See Comments)    Wynetta [employer] bandage (self-gripping) - erythema, itching    Patient Measurements: Height: '6\' 3"'$  (190.5 cm) Weight: (!) 138 kg (304 lb 3.8 oz) IBW/kg (Calculated) : 84.5  Vital Signs: Temp: 98 F (36.7 C) (11/16 0848) Temp Source: Oral (11/16 0848) BP: 116/57 (11/16 0848) Pulse Rate: 60 (11/16 0848)  Labs: Recent Labs    10/27/22 1338 10/28/22 0533  HGB 10.1* 8.4*  HCT 31.0* 26.2*  PLT 218 165  APTT  --  90*  LABPROT  --  15.6*  INR  --  1.3*  HEPARINUNFRC  --  >1.10*  CREATININE 1.27* 1.28*    Estimated Creatinine Clearance: 79.3 mL/min (A) (by C-G formula based on SCr of 1.28 mg/dL (H)).    Assessment: 71 year old male presented with cellulitis. MRI left foot with changes of chronic appearing osteomyelitis with a bone abscess involving the base of the fifth metatarsal with associated pathologic avulsion fracture. Patient with history of atrial fibrillation on Eliquis PTA. This has been held in anticipation of potential procedures. Pharmacy consulted to manage heparin infusion while awaiting plan for surgery/procedures.   Goal of Therapy:  Heparin level 0.3-0.7 units/ml aPTT 66-102 seconds Monitor platelets by anticoagulation protocol: Yes   Plan:  -aPTT 90 - therapeutic on heparin infusion at 1850 units/hr -HL > 1.10 - elevated in setting of recent Eliquis -Recheck aPTT ~ 6 hours to confirm, continue to follow aPTT until correlation with HL -Daily CBC, HL while on heparin infusion -Continue to monitor for signs/symptoms of bleeding  Tawnya Crook, PharmD, BCPS Clinical Pharmacist 10/28/2022 11:12 AM

## 2022-10-28 NOTE — Progress Notes (Signed)
PROGRESS NOTE  [name]  DOB: 1951-04-27  PCP: Sandi Mariscal, MD ONG:[id number]  DOA: 10/27/2022  LOS: 1 day  Hospital Day: 2  Brief narrative: [name] is a 71 y.o. male with PMH significant for obesity, DM2, HTN, HLD, A-fib on Eliquis, colon cancer, chronic anemia BPH, chronic bilateral lower extremity lymphedema with venous stasis ulcers chronically on Lasix, who is a resident at [facility] facility. 11/15, patient was sent to the ED with a left foot wound a/w pain redness, swelling and purulent drainage for 1 to 2 days.  Follows up with Dr. Matilde Haymaker of orthopedic surgery. Per patient, most recent prior MRI of the left foot performed a few months ago showed evidence of subacute versus chronic fifth metatarsal fracture.  No report of recent trauma.  In the ED, patient is afebrile, hemodynamically stable, breathing on room air Labs showed WBC count 4.9, hemoglobin 10.1, BUN/creatinine 20/1.27, lactic acid normal at 1, CRP elevated to 3.4. Blood culture was sent. X-ray of left foot showed soft tissue changes as well as evidence of bony destructive changes of involving the base of the fifth metatarsal, concerning for osteomyelitis without any subcutaneous gas.   MRI left foot showed chronic osteomyelitis with a bony abscess involving the base of the fifth metatarsal with associated pathologic avulsion fracture.  There is also suspicion of septic arthritis of the fifth metatarsal articulation in a seated osteomyelitis of the adjacent cuboid.  There is diffuse cellulitis and myositis. Patient was started on IV antibiotics, IV fluid, pain medicines Admitted to [facility].  Subjective: Patient was seen and examined this afternoon.  Pleasant elderly Caucasian male.  Propped up in bed.  Not in distress. Chart reviewed Labs from this morning with hemoglobin down to 8.4, MCV 106.5, WBC count 3.6, creatinine stable  Assessment and plan: Left foot cellulitis and myositis Chronic  osteomyelitis/abscess of fifth metatarsal with associated fracture Septic arthritis of the fifth metatarsal cuboid joint Presented with worsening left foot wound symptoms in the setting of diabetes mellitus and chronic lymphedema. X-ray and MRI findings as above showing evidence of cellulitis, myositis, osteomyelitis and septic arthritis. Blood culture sent.  Currently on broad-spectrum antibiotic coverage. Orthopedic consultation called to Dr. Sharol Given. Recent Labs  Lab 10/27/22 1338 10/27/22 1510 10/28/22 0533  WBC 4.9  --  3.6*  LATICACIDVEN  --  1.0  --    Chronic bilateral lower extremity lymphedema Chronic venous stasis ulcers PTA on Lasix 80 mg daily.  Currently Lasix on hold.  It seems patient is on LR at 125 mill per hour.  Does not seem septic or volume depleted.  I will stop LR and resume Lasix. Most recent echo from September 2023 with preserved EF Wound care consult appreciated.  Continue local wound care  CKD 2 Creatinine at baseline.  Continue to monitor Recent Labs    12/22/21 0918 12/22/21 1841 12/23/21 0333 09/09/22 1042 09/10/22 0511 09/11/22 0539 09/12/22 0529 09/13/22 0328 10/27/22 1338 10/28/22 0533  BUN 22  --  '17 18 18 17 19 20 20 19  '$ CREATININE 1.09 0.90 1.04 1.46* 1.38* 1.48* 1.44* 1.38* 1.27* 1.28*   Paroxysmal A-fib  Not on any AV nodal blocking agent. Chronically anticoagulated with Eliquis 5 mg twice daily.  Switched to heparin drip on admission.  Type 2 diabetes mellitus A1c 5.3 on 09/09/2022 PTA on metformin 1000 mg twice daily, Januvia 100 mg daily.  Currently on hold. Currently on sliding scale insulin with Accu-Cheks. Recent Labs  Lab 10/28/22 0727 10/28/22  1104 10/28/22 1636  GLUCAP 150* 172* 176*   Hyperlipidemia Lipitor 40 mg daily  History of colon cancer Status post colon resection in the past.  Chronic macrocytic anemia  Chronic vitamin B12 deficiency Baseline hemoglobin close to 8.  No active bleeding at this time.   Continue monitor hemoglobin. Continue vitamin H41, folic acid supplementation Continue Protonix 40 mg daily. Recent Labs    12/22/21 1841 12/23/21 0333 09/09/22 2043 09/10/22 0511 09/11/22 0539 09/12/22 0529 09/13/22 0328 10/27/22 1338 10/28/22 0533  HGB 11.4*   < >  --    < > 8.0* 8.5* 7.8* 10.1* 8.4*  MCV 111.2*   < >  --    < > 106.6* 105.5*  --  105.4* 106.5*  VITAMINB12 164*  --  213  --  153*  --   --   --   --   FOLATE 31.2  --  31.1  --  23.7  --   --   --   --   FERRITIN  --   --  33  --  40  --   --   --   --   TIBC  --   --  374  --  315  --   --   --   --   IRON  --   --  35*  --  38*  --   --   --   --   RETICCTPCT  --   --  2.0  --  1.9  --   --   --   --    < > = values in this interval not displayed.   BPH Continue Flomax 0.4 mg daily  Chronic constipation Bowel regimen with the scheduled Senokot, as needed MiraLAX  ??  Rheumatoid arthritis Home med list shows methotrexate 10 mg weekly.  Patient was not able to tell me the name of the medicine but states he is in for small pills every Friday.      Goals of care   Code Status: Full Code    Mobility: Uses walker at baseline.  Needs PT eval after procedure  Skin assessment:     Nutritional status:  Body mass index is 38.03 kg/m.          Diet:  Diet Order             Diet Carb Modified Fluid consistency: Thin; Room service appropriate? Yes  Diet effective now                   DVT prophylaxis:  SCDs Start: 10/27/22 1957   Antimicrobials: IV cefepime, vancomycin, Flagyl Fluid: Stop IV fluid Consultants: Dr. Sharol Given Family Communication: None at bedside  Status is: inpatient  Continue in-hospital care because: IV antibiotics.  May need procedure Level of care: Telemetry   Dispo: The patient is from: Long-term care              Anticipated d/c is to: Pending clinical course              Patient currently is not medically stable to d/c.   Difficult to place patient  No     Infusions:   ceFEPime (MAXIPIME) IV 2 g (10/28/22 1221)   heparin 1,850 Units/hr (10/28/22 1103)   metronidazole 500 mg (10/28/22 1113)   vancomycin      Scheduled Meds:  atorvastatin  20 mg Oral Daily   docusate sodium  100 mg Oral BID   [  START ON 10/29/2022] furosemide  80 mg Oral Daily   insulin aspart  0-9 Units Subcutaneous TID WC   pantoprazole  40 mg Oral Daily   tamsulosin  0.4 mg Oral Daily    PRN meds: acetaminophen **OR** acetaminophen, fentaNYL (SUBLIMAZE) injection, naLOXone (NARCAN)  injection, ondansetron (ZOFRAN) IV, polyethylene glycol   Antimicrobials: Anti-infectives (From admission, onward)    Start     Dose/Rate Route Frequency Ordered Stop   10/28/22 2000  vancomycin (VANCOREADY) IVPB 2000 mg/400 mL        2,000 mg 200 mL/hr over 120 Minutes Intravenous Every 24 hours 10/27/22 2010     10/28/22 0800  metroNIDAZOLE (FLAGYL) IVPB 500 mg        500 mg 100 mL/hr over 60 Minutes Intravenous Every 12 hours 10/27/22 1959     10/28/22 0400  ceFEPIme (MAXIPIME) 2 g in sodium chloride 0.9 % 100 mL IVPB        2 g 200 mL/hr over 30 Minutes Intravenous Every 8 hours 10/27/22 2010     10/27/22 1945  vancomycin (VANCOREADY) IVPB 2000 mg/400 mL        2,000 mg 200 mL/hr over 120 Minutes Intravenous STAT 10/27/22 1931 10/28/22 0023   10/27/22 1915  ceFEPIme (MAXIPIME) 2 g in sodium chloride 0.9 % 100 mL IVPB        2 g 200 mL/hr over 30 Minutes Intravenous  Once 10/27/22 1903 10/27/22 2029   10/27/22 1915  metroNIDAZOLE (FLAGYL) IVPB 500 mg        500 mg 100 mL/hr over 60 Minutes Intravenous  Once 10/27/22 1903 10/27/22 2131   10/27/22 1915  vancomycin (VANCOCIN) IVPB 1000 mg/200 mL premix  Status:  Discontinued        1,000 mg 200 mL/hr over 60 Minutes Intravenous  Once 10/27/22 1903 10/27/22 1930       Objective: Vitals:   10/28/22 0848 10/28/22 1311  BP: (!) 116/57 (!) 119/54  Pulse: 60 65  Resp: 16 18  Temp: 98 F (36.7 C) 98.6 F (37 C)   SpO2: 98% 98%    Intake/Output Summary (Last 24 hours) at 10/28/2022 1639 Last data filed at 10/28/2022 1313 Gross per 24 hour  Intake 100 ml  Output 775 ml  Net -675 ml   Filed Weights   10/27/22 1330  Weight: (!) 138 kg   Weight change:  Body mass index is 38.03 kg/m.   Physical Exam: General exam: Pleasant, elderly Caucasian male.  Not in distress Skin: No rashes, lesions or ulcers. HEENT: Atraumatic, normocephalic, no obvious bleeding Lungs: Clear to auscultation bilaterally CVS: Regular rate and rhythm, no murmur GI/Abd soft, nontender, nondistended, bowel sound present CNS: Alert, awake, oriented X 3 Psychiatry: mood appropriate Extremities: chronic b/l pedal edema. Bandaged LLE  Data Review: I have personally reviewed the laboratory data and studies available.  F/u labs ordered Unresulted Labs (From admission, onward)     Start     Ordered   10/29/22 0500  Heparin level (unfractionated)  Daily at 5am,   R      10/28/22 1115   10/29/22 0500  APTT  Daily at 5am,   R      10/28/22 1348   10/28/22 0733  MRSA Next Gen by PCR, Nasal  Once,   R        10/28/22 0733   10/28/22 0500  CBC  Daily at 5am,   R      10/27/22 2117  Signed, Terrilee Croak, MD [facility] 10/28/2022

## 2022-10-28 NOTE — TOC Initial Note (Signed)
Transition of Care [facility]) - Initial/Assessment Note    Patient Details  Name: [name] MRN: [id number] Date of Birth: 1951/05/01  [facility] CM/SW Contact:    Vassie Moselle, [place] Phone Number: 10/28/2022, 12:44 PM  Clinical Narrative:                  Pt is current resident at [facility] and plans to return at discharge. Pt shares that he uses a RW at baseline.  CSW left a voicemail for [employer] to confirm pt's return at discharge.  TOC will continue to follow for discharge needs.  Expected Discharge Plan: Assisted Living Barriers to Discharge: Continued Medical Work up   Patient Goals and CMS Choice   CMS Medicare.gov Compare Post Acute Care list provided to:: Patient Choice offered to / list presented to : Patient  Expected Discharge Plan and Services Expected Discharge Plan: Assisted Living In-house Referral: NA Discharge Planning Services: CM Consult Post Acute Care Choice: Resumption of Svcs/PTA Provider, Nursing Home Living arrangements for the past 2 months: [facility]                 DME Arranged: N/A DME Agency: NA                  Prior Living Arrangements/Services Living arrangements for the past 2 months: [place] Lives with:: Facility Resident Patient language and need for interpreter reviewed:: Yes Do you feel safe going back to the place where you live?: Yes      Need for Family Participation in Patient Care: No (Comment) Care giver support system in place?: Yes (comment) Current home services: DME (RW) Criminal Activity/Legal Involvement Pertinent to Current Situation/Hospitalization: No - Comment as needed  Activities of Daily Living Home Assistive Devices/Equipment: Eyeglasses, Environmental consultant (specify type) (reading glasses, urinals, bilateral knee high compression stockings, left orthopedic boot) ADL Screening (condition at time of admission) Patient's cognitive ability adequate to  safely complete daily activities?: Yes Is the patient deaf or have difficulty hearing?: Yes (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, states he "broke his ear drum" in the past) Does the patient have difficulty seeing, even when wearing glasses/contacts?: Yes (hx bilateral cataract surgery in 2014) Does the patient have difficulty concentrating, remembering, or making decisions?: No Patient able to express need for assistance with ADLs?: Yes Does the patient have difficulty dressing or bathing?: Yes Independently performs ADLs?: No Communication: Independent Dressing (OT): Independent Grooming: Independent Feeding: Independent Bathing: Independent Toileting: Independent In/Out Bed: Needs assistance (can get in and out of a high bed but needs help with toilet and chair) Walks in Home: Needs assistance Is this a change from baseline?: Pre-admission baseline Does the patient have difficulty walking or climbing stairs?: Yes Weakness of Legs: Both Weakness of Arms/Hands: Both  Permission Sought/Granted Permission sought to share information with : Investment banker, corporate granted to share info w AGENCY: [employer]        Emotional Assessment   Attitude/Demeanor/Rapport: Engaged Affect (typically observed): Accepting Orientation: : Oriented to Self, Oriented to Place, Oriented to  Time, Oriented to Situation Alcohol / Substance Use: Not Applicable Psych Involvement: No (comment)  Admission diagnosis:  Cellulitis of left foot [L03.116] Patient Active Problem List   Diagnosis Date Noted   Cellulitis of left foot 10/27/2022   IDA (iron deficiency anemia) 09/10/2022   Chronic diastolic CHF (congestive heart failure) ([place]) 09/10/2022   Chronic anticoagulation  09/09/2022   Personal history of cecal colon cancer 09/09/2022   Anemia of chronic disease 09/09/2022   Venous stasis ulcers- BLE 09/09/2022   Acute cholecystitis due to biliary calculus 09/09/2022    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   Subacute eczematous & allergic contact dermatitis  09/09/2022   History of COVID-19 09/09/2022   History of homelessness 09/09/2022   Diverticulosis of colon 09/09/2022   Cholelithiasis 09/09/2022   Hepatic steatosis 09/09/2022   Living in assisted living 09/09/2022   Pain in right foot 05/06/2022   Atrial fibrillation with slow ventricular response ([place]) 12/23/2021   Pain of joint of left ankle and foot 02/26/2021   BPH (benign prostatic hyperplasia) 11/30/2016   Abnormal CT scan, colon    AKI (acute kidney injury) ([place])    Generalized weakness    Abdominal pain    Protein-calorie malnutrition, severe 11/10/2016   Constipation, chronic 11/09/2016   CKD (chronic kidney disease) stage 3, GFR 30-59 ml/min (HCC) 11/09/2016   Hyperbilirubinemia 11/09/2016   Hyponatremia 11/08/2016   RBBB 09/13/2013   Snoring 09/13/2013   Edema 09/07/2013   DM2 (diabetes mellitus, type 2) ([place]) 03/16/2013   Arthritis 03/16/2013   Essential hypertension, benign 02/23/2013   Morbid obesity ([place]) 02/23/2013   Paroxysmal atrial fibrillation ([place]) 02/23/2013   HLD (hyperlipidemia) 02/23/2013   PCP:  Sandi Mariscal, MD Pharmacy:   [place], [place] - [address] [place] [address] Phone: [phone] Fax: [phone]     Social Determinants of Health (SDOH) Interventions Transportation Interventions: Intervention Not Indicated  Readmission Risk Interventions    10/28/2022   12:37 PM 10/28/2022    8:36 AM  Readmission Risk Prevention Plan  Transportation Screening Complete Complete  PCP or Specialist Appt within 5-7 Days Complete Complete  Home Care Screening Complete Complete  Medication Review (RN CM) Complete Complete

## 2022-10-28 NOTE — Progress Notes (Signed)
ANTICOAGULATION CONSULT NOTE  Pharmacy Consult for heparin Indication: atrial fibrillation  Allergies  Allergen Reactions   Other Itching and Other (See Comments)    Wynetta [employer] bandage (self-gripping) - erythema, itching    Patient Measurements: Height: '6\' 3"'$  (190.5 cm) Weight: (!) 138 kg (304 lb 3.8 oz) IBW/kg (Calculated) : 84.5  Vital Signs: Temp: 98.6 F (37 C) (11/16 1311) Temp Source: Oral (11/16 1311) BP: 119/54 (11/16 1311) Pulse Rate: 65 (11/16 1311)  Labs: Recent Labs    10/27/22 1338 10/28/22 0533 10/28/22 1257  HGB 10.1* 8.4*  --   HCT 31.0* 26.2*  --   PLT 218 165  --   APTT  --  90* 77*  LABPROT  --  15.6*  --   INR  --  1.3*  --   HEPARINUNFRC  --  >1.10*  --   CREATININE 1.27* 1.28*  --      Estimated Creatinine Clearance: 79.3 mL/min (A) (by C-G formula based on SCr of 1.28 mg/dL (H)).    Assessment: 71 year old male presented with cellulitis. MRI left foot with changes of chronic appearing osteomyelitis with a bone abscess involving the base of the fifth metatarsal with associated pathologic avulsion fracture. Patient with history of atrial fibrillation on Eliquis PTA. This has been held in anticipation of potential procedures. Pharmacy consulted to manage heparin infusion while awaiting plan for surgery/procedures.   Goal of Therapy:  Heparin level 0.3-0.7 units/ml aPTT 66-102 seconds Monitor platelets by anticoagulation protocol: Yes   Plan:  -Repeat aPTT 77 - therapeutic on heparin infusion at 1850 units/hr -HL > 1.10 - elevated in setting of recent Eliquis -Daily CBC, HL and aPTT while on heparin infusion -Continue to monitor for signs/symptoms of bleeding  Tawnya Crook, PharmD, BCPS Clinical Pharmacist 10/28/2022 1:46 PM

## 2022-10-28 NOTE — Consult Note (Signed)
WOC Nurse Consult Note: Reason for Consult:bilateral LEs with edema, erythema, partial thickness skin loss and irritant contact dermatitis Wound type:venous insufficiency, irritant contact dermatitis, infection Pressure Injury POA: N/A Measurement:Photos provided by EDP yesterday. Bedside RN to measure at time of first dressing application and document measurements on Nursing Flow Sheet.  Wound HTX:HFSFS serum to left dorsal foot, left pretibial area, shallow pink wound with small serous exudate to right posterior LE Drainage (amount, consistency, odor) See above Periwound:erythema, edema, L>R Dressing procedure/placement/frequency:I have provided Nursing with guidance for the topical acre of the LEs as well as guidance for pressure injury prevention including, but not limited to the placement of a sacral prophylactic foam dressing, floatation of the bilateral heels using Prevalon Boots and turning and repositioning to minimize time in the supine position. I have provided a pressure redistribution chair cushion for when patient is OOB in chair and this is to be sent with the patient at time of discharge.  [place] nursing team will not follow, but will remain available to this patient, the nursing and medical teams.  Please re-consult if needed.  Thank you for inviting [place] to participate in this patient's Plan of Care.  Maudie Flakes, MSN, RN, CNS, [place], Serita Grammes, [employer], [employer] phone:  [phone]

## 2022-10-29 DIAGNOSIS — L03116 Cellulitis of left lower limb: Secondary | ICD-10-CM | POA: Diagnosis not present

## 2022-10-29 LAB — CBC
HCT: 27.6 % — ABNORMAL LOW (ref 39.0–52.0)
Hemoglobin: 8.5 g/dL — ABNORMAL LOW (ref 13.0–17.0)
MCH: 34 pg (ref 26.0–34.0)
MCHC: 30.8 g/dL (ref 30.0–36.0)
MCV: 110.4 fL — ABNORMAL HIGH (ref 80.0–100.0)
Platelets: 147 10*3/uL — ABNORMAL LOW (ref 150–400)
RBC: 2.5 MIL/uL — ABNORMAL LOW (ref 4.22–5.81)
RDW: 17.9 % — ABNORMAL HIGH (ref 11.5–15.5)
WBC: 3.7 10*3/uL — ABNORMAL LOW (ref 4.0–10.5)
nRBC: 0 % (ref 0.0–0.2)

## 2022-10-29 LAB — BASIC METABOLIC PANEL
Anion gap: 7 (ref 5–15)
BUN: 20 mg/dL (ref 8–23)
CO2: 24 mmol/L (ref 22–32)
Calcium: 8.2 mg/dL — ABNORMAL LOW (ref 8.9–10.3)
Chloride: 103 mmol/L (ref 98–111)
Creatinine, Ser: 1.27 mg/dL — ABNORMAL HIGH (ref 0.61–1.24)
GFR, Estimated: >60 mL/min (ref >60)
Glucose, Bld: 133 mg/dL — ABNORMAL HIGH (ref 70–99)
Potassium: 3.7 mmol/L (ref 3.5–5.1)
Sodium: 134 mmol/L — ABNORMAL LOW (ref 135–145)

## 2022-10-29 LAB — GLUCOSE, CAPILLARY
Glucose-Capillary: 128 mg/dL — ABNORMAL HIGH (ref 70–99)
Glucose-Capillary: 116 mg/dL — ABNORMAL HIGH (ref 70–99)
Glucose-Capillary: 148 mg/dL — ABNORMAL HIGH (ref 70–99)
Glucose-Capillary: 222 mg/dL — ABNORMAL HIGH (ref 70–99)

## 2022-10-29 LAB — HEPARIN LEVEL (UNFRACTIONATED): Heparin Unfractionated: 0.44 IU/mL (ref 0.30–0.70)

## 2022-10-29 LAB — APTT: aPTT: 84 seconds — ABNORMAL HIGH (ref 24–36)

## 2022-10-29 MED ORDER — RENA-VITE PO TABS
1.0000 | ORAL_TABLET | Freq: Every day | ORAL | Status: DC
  Administered 2022-10-29 – 2022-11-15 (×18): 1 via ORAL
  Filled 2022-10-29 (×18): qty 1

## 2022-10-29 NOTE — Progress Notes (Signed)
Initial Nutrition Assessment  DOCUMENTATION CODES:   Obesity unspecified  INTERVENTION:  -Continue regular diet as tolerated -Provide renal MVI  NUTRITION DIAGNOSIS:  Increased nutrient needs related to wound healing as evidenced by estimated needs.  GOAL:  Patient will meet greater than or equal to 90% of their needs  MONITOR:  PO intake  REASON FOR ASSESSMENT:  Malnutrition Screening Tool    ASSESSMENT:  Pt is a 71yo M with PMH of a-fib, DM, HLD, HTN, anemia, adenocarcinoma of colon, chronic constipation, and BPH who presents with cellulitis of L foot.  Pt being treated for osteomyelitis of 5th metatarsal. Initially planned for [facility] at [facility], but delayed until tomorrow, 11/18. Pt on regular diet, with plans for NPO at midnight.   Screened for assessment due to positive MST on admission. Pt reports a significant 26# intentional weight loss in the last 1.5 months. In this time he has cut back his portion sizes, watch CHO and also had fluid shift with lymphedema. No loss of appetite, n/v noted. B12 checked in September was low, recommend daily Rena-vite. Monitor intake and provide ONS if needed.  Date Weight  10/29/22 131.3 kg  09/10/22 (!) 138.8 kg  06/03/22 125.8 kg  02/02/22 117.5 kg  12/22/21 115.5 kg  *Weight gain in the last 10 months  Medications reviewed and include: lipitor, colace, lasix, novolog, protonix, cefepime, flagyl, vancomycin  Labs reviewed: Na:134, Cr:1.27, BG:128-176, A1c:5.3   NUTRITION - FOCUSED PHYSICAL EXAM: Deferred, RD working remotely   Diet Order:   Diet Order             Diet regular Room service appropriate? Yes; Fluid consistency: Thin  Diet effective now                   EDUCATION NEEDS:  Not appropriate for education at this time  Skin:  Skin Assessment: Skin Integrity Issues: Skin Integrity Issues:: Incisions Incisions: L foot  Last BM:  11/16  Height:  Ht Readings from Last 1  Encounters:  10/27/22 '6\' 3"'$  (1.905 m)   Weight:  Wt Readings from Last 1 Encounters:  10/29/22 131.3 kg   Ideal Body Weight:  89.1 kg  BMI:  Body mass index is 36.18 kg/m.  Estimated Nutritional Needs:  Kcal:  2000-2400kcal Protein:  105-135g Fluid:  >2L  Candise Bowens, MS, RD, LDN, CNSC See AMi[facility] for contact information

## 2022-10-29 NOTE — Progress Notes (Signed)
BLE dressing change. Removed former gauze, cleansed completely & between toes with soap/water, rinsed, & patted dry. Covered open areas with xeroform, placed open ABD pads on legs, wrapped all with kerlix, & secured with tape. Pt tolerated all very well.

## 2022-10-29 NOTE — Progress Notes (Signed)
ANTICOAGULATION CONSULT NOTE  Pharmacy Consult for heparin Indication: atrial fibrillation  Allergies  Allergen Reactions   Other Itching and Other (See Comments)    Wynetta [employer] bandage (self-gripping) - erythema, itching    Patient Measurements: Height: '6\' 3"'$  (190.5 cm) Weight: 131.3 kg (289 lb 7.4 oz) IBW/kg (Calculated) : 84.5  Vital Signs: Temp: 97.7 F (36.5 C) (11/17 0518) BP: 114/56 (11/17 0518) Pulse Rate: 62 (11/17 0518)  Labs: Recent Labs    10/27/22 1338 10/28/22 0533 10/28/22 1257 10/29/22 0537 10/29/22 0715  HGB 10.1* 8.4*  --  8.5*  --   HCT 31.0* 26.2*  --  27.6*  --   PLT 218 165  --  147*  --   APTT  --  90* 77*  --  84*  LABPROT  --  15.6*  --   --   --   INR  --  1.3*  --   --   --   HEPARINUNFRC  --  >1.10*  --   --  0.44  CREATININE 1.27* 1.28*  --  1.27*  --      Estimated Creatinine Clearance: 77.9 mL/min (A) (by C-G formula based on SCr of 1.27 mg/dL (H)).  Assessment: 71 year old male presented with cellulitis. MRI left foot with changes of chronic appearing osteomyelitis with a bone abscess involving the base of the fifth metatarsal with associated pathologic avulsion fracture. Patient with history of atrial fibrillation on Eliquis PTA. This has been held in anticipation of potential procedures. Pharmacy consulted to manage heparin infusion while awaiting plan for surgery/procedures.   Today, 10/29/2022: Daily aPTT and heparin levels both therapeutic on heparin at 1850 units/hr, suggesting DOAC anti-Xa effects have dissipated CBC: Hgb low but stable; Plt borderline low No bleeding or infusion issues per RN  Goal of Therapy:  Heparin level 0.3-0.7 units/ml aPTT 66-102 seconds Monitor platelets by anticoagulation protocol: Yes   Plan:  Continue IV heparin at 1850 units/hr Daily CBC & HL; aPTT no longer needed as HLs appear accurate Continue to monitor for signs/symptoms of bleeding F/u ability to transition back to  Eliquis  Cathie Bonnell A, PharmD, BCPS Clinical Pharmacist 10/29/2022 9:31 AM

## 2022-10-29 NOTE — Progress Notes (Signed)
PROGRESS NOTE  [name]  DOB: 08/04/1951  PCP: Sandi Mariscal, MD HWE:[id number]  DOA: 10/27/2022  LOS: 2 days  Hospital Day: 3  Brief narrative: [name] is a 71 y.o. male with PMH significant for obesity, DM2, HTN, HLD, A-fib on Eliquis, colon cancer, chronic anemia BPH, chronic bilateral lower extremity lymphedema with venous stasis ulcers chronically on Lasix, who is a resident at [facility] facility. 11/15, patient was sent to the ED with a left foot wound a/w pain redness, swelling and purulent drainage for 1 to 2 days.  Follows up with Dr. Matilde Haymaker of orthopedic surgery. Per patient, most recent prior MRI of the left foot performed a few months ago showed evidence of subacute versus chronic fifth metatarsal fracture.  No report of recent trauma.  In the ED, patient is afebrile, hemodynamically stable, breathing on room air Labs showed WBC count 4.9, hemoglobin 10.1, BUN/creatinine 20/1.27, lactic acid normal at 1, CRP elevated to 3.4. Blood culture was sent. X-ray of left foot showed soft tissue changes as well as evidence of bony destructive changes of involving the base of the fifth metatarsal, concerning for osteomyelitis without any subcutaneous gas.   MRI left foot showed chronic osteomyelitis with a bony abscess involving the base of the fifth metatarsal with associated pathologic avulsion fracture.  There is also suspicion of septic arthritis of the fifth metatarsal articulation in a seated osteomyelitis of the adjacent cuboid.  There is diffuse cellulitis and myositis. Patient was started on IV antibiotics, IV fluid, pain medicines Admitted to [facility].  Subjective: Patient was seen and examined this morning.  Pleasant elderly Caucasian male.  Propped up in bed.  Not in distress.  Was kept n.p.o. this morning for potential transfer to [facility] in surgery but unable to transfer today because of the bed lack of bed availability.  Diet ordered.  Assessment and plan: Left  foot cellulitis and myositis Chronic osteomyelitis/abscess of fifth metatarsal with associated fracture Septic arthritis of the fifth metatarsal cuboid joint Presented with worsening left foot wound symptoms in the setting of diabetes mellitus and chronic lymphedema. X-ray and MRI findings as above showing evidence of cellulitis, myositis, osteomyelitis and septic arthritis. Blood culture sent.  Currently on broad-spectrum antibiotic coverage. Orthopedic consultation called to Dr. Sharol Given. Dr. Sharol Given had recommended to transfer patient to [facility] with the potential surgical intervention today.  However because of lack of bed availability, transfer could not happen.  Diet reviewed today.  N.p.o. after midnight for potential surgical intervention tomorrow. Recent Labs  Lab 10/27/22 1338 10/27/22 1510 10/28/22 0533 10/29/22 0537  WBC 4.9  --  3.6* 3.7*  LATICACIDVEN  --  1.0  --   --    Chronic bilateral lower extremity lymphedema Chronic venous stasis ulcers PTA on Lasix 80 mg daily. Currently continued on lasix. Most recent echo from September 2023 with preserved EF Wound care consult appreciated.  Continue local wound care  CKD 2 Creatinine at baseline.  Continue to monitor Recent Labs    12/22/21 0918 12/22/21 1841 12/23/21 0333 09/09/22 1042 09/10/22 0511 09/11/22 0539 09/12/22 0529 09/13/22 0328 10/27/22 1338 10/28/22 0533 10/29/22 0537  BUN 22  --  '17 18 18 17 19 20 20 19 20  '$ CREATININE 1.09 0.90 1.04 1.46* 1.38* 1.48* 1.44* 1.38* 1.27* 1.28* 1.27*   Paroxysmal A-fib  Not on any AV nodal blocking agent. Chronically anticoagulated with Eliquis 5 mg twice daily.  Switched to heparin drip on admission.  Type 2 diabetes mellitus A1c  5.3 on 09/09/2022 PTA on metformin 1000 mg twice daily, Januvia 100 mg daily.  Currently on hold. Currently on sliding scale insulin with Accu-Cheks. Recent Labs  Lab 10/28/22 0727 10/28/22 1104 10/28/22 1636 10/28/22 2130 10/29/22 0748   GLUCAP 150* 172* 176* 151* 128*   Hyperlipidemia Lipitor 40 mg daily  History of colon cancer Status post colon resection in the past.  Chronic macrocytic anemia  Chronic vitamin B12 deficiency Baseline hemoglobin close to 8.  No active bleeding at this time.  Continue monitor hemoglobin. Continue vitamin Q22, folic acid supplementation Continue Protonix 40 mg daily. Recent Labs    12/22/21 1841 12/23/21 0333 09/09/22 2043 09/10/22 0511 09/11/22 0539 09/12/22 0529 09/13/22 0328 10/27/22 1338 10/28/22 0533 10/29/22 0537  HGB 11.4*   < >  --    < > 8.0* 8.5* 7.8* 10.1* 8.4* 8.5*  MCV 111.2*   < >  --    < > 106.6* 105.5*  --  105.4* 106.5* 110.4*  VITAMINB12 164*  --  213  --  153*  --   --   --   --   --   FOLATE 31.2  --  31.1  --  23.7  --   --   --   --   --   FERRITIN  --   --  33  --  40  --   --   --   --   --   TIBC  --   --  374  --  315  --   --   --   --   --   IRON  --   --  35*  --  38*  --   --   --   --   --   RETICCTPCT  --   --  2.0  --  1.9  --   --   --   --   --    < > = values in this interval not displayed.   BPH Continue Flomax 0.4 mg daily  Chronic constipation Bowel regimen with the scheduled Senokot, as needed MiraLAX  ?? Rheumatoid arthritis Home med list shows methotrexate 10 mg weekly.  Patient was not able to tell me the name of the medicine but states he takes 4 small pills every Friday. He is not sure but believes he has 'some kind of arthritis'.     Goals of care   Code Status: Full Code    Mobility: Uses walker at baseline.  Needs PT eval after procedure  Skin assessment:     Nutritional status:  Body mass index is 36.18 kg/m.          Diet:  Diet Order             Diet regular Room service appropriate? Yes; Fluid consistency: Thin  Diet effective now                   DVT prophylaxis:  SCDs Start: 10/27/22 1957   Antimicrobials: IV cefepime, vancomycin, Flagyl Fluid: Not on IV fluid Consultants: Dr.  Sharol Given Family Communication: None at bedside  Status is: inpatient  Continue in-hospital care because: IV antibiotics.  Pending transfer to [facility] for procedure Level of care: Med-Surg   Dispo: The patient is from: Long-term care              Anticipated d/c is to: Pending clinical course  Patient currently is not medically stable to d/c.   Difficult to place patient No     Infusions:   ceFEPime (MAXIPIME) IV Stopped (10/29/22 0555)   heparin 1,850 Units/hr (10/29/22 0904)   metronidazole Stopped (10/29/22 0022)   vancomycin Stopped (10/29/22 0514)    Scheduled Meds:  atorvastatin  20 mg Oral Daily   docusate sodium  100 mg Oral BID   furosemide  80 mg Oral Daily   insulin aspart  0-9 Units Subcutaneous TID WC   pantoprazole  40 mg Oral Daily   tamsulosin  0.4 mg Oral Daily    PRN meds: acetaminophen **OR** acetaminophen, fentaNYL (SUBLIMAZE) injection, naLOXone (NARCAN)  injection, ondansetron (ZOFRAN) IV, polyethylene glycol   Antimicrobials: Anti-infectives (From admission, onward)    Start     Dose/Rate Route Frequency Ordered Stop   10/28/22 2000  vancomycin (VANCOREADY) IVPB 2000 mg/400 mL        2,000 mg 200 mL/hr over 120 Minutes Intravenous Every 24 hours 10/27/22 2010     10/28/22 0800  metroNIDAZOLE (FLAGYL) IVPB 500 mg        500 mg 100 mL/hr over 60 Minutes Intravenous Every 12 hours 10/27/22 1959     10/28/22 0400  ceFEPIme (MAXIPIME) 2 g in sodium chloride 0.9 % 100 mL IVPB        2 g 200 mL/hr over 30 Minutes Intravenous Every 8 hours 10/27/22 2010     10/27/22 1945  vancomycin (VANCOREADY) IVPB 2000 mg/400 mL        2,000 mg 200 mL/hr over 120 Minutes Intravenous STAT 10/27/22 1931 10/28/22 1900   10/27/22 1915  ceFEPIme (MAXIPIME) 2 g in sodium chloride 0.9 % 100 mL IVPB        2 g 200 mL/hr over 30 Minutes Intravenous  Once 10/27/22 1903 10/27/22 2029   10/27/22 1915  metroNIDAZOLE (FLAGYL) IVPB 500 mg        500 mg 100 mL/hr over 60  Minutes Intravenous  Once 10/27/22 1903 10/28/22 1900   10/27/22 1915  vancomycin (VANCOCIN) IVPB 1000 mg/200 mL premix  Status:  Discontinued        1,000 mg 200 mL/hr over 60 Minutes Intravenous  Once 10/27/22 1903 10/27/22 1930       Objective: Vitals:   10/28/22 2043 10/29/22 0518  BP: (!) 117/47 (!) 114/56  Pulse: 68 62  Resp: 18 18  Temp: 98.7 F (37.1 C) 97.7 F (36.5 C)  SpO2: 98% 99%    Intake/Output Summary (Last 24 hours) at 10/29/2022 1051 Last data filed at 10/29/2022 1050 Gross per 24 hour  Intake 2730.26 ml  Output 2125 ml  Net 605.26 ml   Filed Weights   10/27/22 1330 10/29/22 0500  Weight: (!) 138 kg 131.3 kg   Weight change: -6.7 kg Body mass index is 36.18 kg/m.   Physical Exam: General exam: Pleasant, elderly Caucasian male.  Not in distress. Skin: No rashes, lesions or ulcers. HEENT: Atraumatic, normocephalic, no obvious bleeding Lungs: Clear to auscultation bilaterally CVS: Regular rate and rhythm, no murmur GI/Abd soft, nontender, nondistended, bowel sound present CNS: Alert, awake, oriented X 3 Psychiatry: mood appropriate Extremities: chronic b/l lower extremity lymphedema.. Bandaged LLE  Data Review: I have personally reviewed the laboratory data and studies available.  F/u labs ordered Unresulted Labs (From admission, onward)     Start     Ordered   10/29/22 0500  Heparin level (unfractionated)  Daily at 5am,   R  10/28/22 1115   10/29/22 0500  APTT  Daily at 5am,   R      10/28/22 1348   10/28/22 0500  CBC  Daily at 5am,   R      10/27/22 2117   Unscheduled  Basic metabolic panel  Daily at 5am,   R      10/29/22 1051            Signed, Terrilee Croak, MD [facility] 10/29/2022

## 2022-10-29 NOTE — Progress Notes (Signed)
Report was given to [facility] and PTAR set up to transfer patient to [facility]. Patient is updated and agreed with the plan.

## 2022-10-30 DIAGNOSIS — M869 Osteomyelitis, unspecified: Secondary | ICD-10-CM | POA: Diagnosis not present

## 2022-10-30 DIAGNOSIS — E440 Moderate protein-calorie malnutrition: Secondary | ICD-10-CM

## 2022-10-30 DIAGNOSIS — L03116 Cellulitis of left lower limb: Secondary | ICD-10-CM | POA: Diagnosis not present

## 2022-10-30 LAB — GLUCOSE, CAPILLARY
Glucose-Capillary: 140 mg/dL — ABNORMAL HIGH (ref 70–99)
Glucose-Capillary: 179 mg/dL — ABNORMAL HIGH (ref 70–99)
Glucose-Capillary: 199 mg/dL — ABNORMAL HIGH (ref 70–99)
Glucose-Capillary: 236 mg/dL — ABNORMAL HIGH (ref 70–99)
Glucose-Capillary: 242 mg/dL — ABNORMAL HIGH (ref 70–99)

## 2022-10-30 LAB — CBC
HCT: 26 % — ABNORMAL LOW (ref 39.0–52.0)
HCT: 29 % — ABNORMAL LOW (ref 39.0–52.0)
Hemoglobin: 8.7 g/dL — ABNORMAL LOW (ref 13.0–17.0)
Hemoglobin: 9.4 g/dL — ABNORMAL LOW (ref 13.0–17.0)
MCH: 34.9 pg — ABNORMAL HIGH (ref 26.0–34.0)
MCH: 34.3 pg — ABNORMAL HIGH (ref 26.0–34.0)
MCHC: 33.5 g/dL (ref 30.0–36.0)
MCHC: 32.4 g/dL (ref 30.0–36.0)
MCV: 104.4 fL — ABNORMAL HIGH (ref 80.0–100.0)
MCV: 105.8 fL — ABNORMAL HIGH (ref 80.0–100.0)
Platelets: 159 10*3/uL (ref 150–400)
Platelets: 174 10*3/uL (ref 150–400)
RBC: 2.49 MIL/uL — ABNORMAL LOW (ref 4.22–5.81)
RBC: 2.74 MIL/uL — ABNORMAL LOW (ref 4.22–5.81)
RDW: 17.9 % — ABNORMAL HIGH (ref 11.5–15.5)
RDW: 17.8 % — ABNORMAL HIGH (ref 11.5–15.5)
WBC: 4.4 10*3/uL (ref 4.0–10.5)
WBC: 4.3 10*3/uL (ref 4.0–10.5)
nRBC: 0 % (ref 0.0–0.2)
nRBC: 0 % (ref 0.0–0.2)

## 2022-10-30 LAB — APTT: aPTT: 59 seconds — ABNORMAL HIGH (ref 24–36)

## 2022-10-30 LAB — BASIC METABOLIC PANEL
Anion gap: 12 (ref 5–15)
BUN: 20 mg/dL (ref 8–23)
CO2: 23 mmol/L (ref 22–32)
Calcium: 8.6 mg/dL — ABNORMAL LOW (ref 8.9–10.3)
Chloride: 103 mmol/L (ref 98–111)
Creatinine, Ser: 1.26 mg/dL — ABNORMAL HIGH (ref 0.61–1.24)
GFR, Estimated: >60 mL/min (ref >60)
Glucose, Bld: 181 mg/dL — ABNORMAL HIGH (ref 70–99)
Potassium: 4 mmol/L (ref 3.5–5.1)
Sodium: 138 mmol/L (ref 135–145)

## 2022-10-30 LAB — HEPARIN LEVEL (UNFRACTIONATED)
Heparin Unfractionated: 0.2 IU/mL — ABNORMAL LOW (ref 0.30–0.70)
Heparin Unfractionated: <0.1 IU/mL — ABNORMAL LOW (ref 0.30–0.70)
Heparin Unfractionated: 0.16 IU/mL — ABNORMAL LOW (ref 0.30–0.70)

## 2022-10-30 MED ORDER — TRAZODONE HCL 50 MG PO TABS
50.0000 mg | ORAL_TABLET | Freq: Every evening | ORAL | Status: DC | PRN
  Administered 2022-10-30 – 2022-11-13 (×9): 50 mg via ORAL
  Filled 2022-10-30 (×10): qty 1

## 2022-10-30 MED ORDER — METOPROLOL TARTRATE 5 MG/5ML IV SOLN: 5.0000 mg | INTRAVENOUS | Status: DC | PRN

## 2022-10-30 MED ORDER — HYDRALAZINE HCL 20 MG/ML IJ SOLN: 10.0000 mg | INTRAMUSCULAR | Status: DC | PRN

## 2022-10-30 MED ORDER — GUAIFENESIN 100 MG/5ML PO LIQD
5.0000 mL | ORAL | Status: DC | PRN
  Administered 2022-10-30 – 2022-11-03 (×5): 5 mL via ORAL
  Filled 2022-10-30 (×5): qty 5

## 2022-10-30 MED ORDER — IPRATROPIUM-ALBUTEROL 0.5-2.5 (3) MG/3ML IN SOLN: 3.0000 mL | RESPIRATORY_TRACT | Status: DC | PRN

## 2022-10-30 NOTE — Consult Note (Signed)
ORTHOPAEDIC CONSULTATION  REQUESTING PHYSICIAN: Allen Lack, MD  Chief Complaint: Ulceration and purulent drainage left foot.  HPI: RENALD Reese is a 71 y.o. male who presents with ulceration over the dorsal and plantar aspect of the left foot.  Patient states that he has noticed some drainage from the bottom of his foot while walking at home.  Patient states he currently lives in a assisted living facility.  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  Past Medical History:  Diagnosis Date   Adenocarcinoma of colon [facility])    Atrial fibrillation ([place])    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   Cataract    Constipation, chronic 11/09/2016   Diabetes mellitus without complication ([place])    History of COVID-19 09/09/2022   History of homelessness 09/09/2022   History of kidney stones 09/09/2022   History of Stercoral ulcer of rectum 11/28/2016   Hyperlipidemia    Hypertension    Lichen planus    bilateral legs   Nausea & vomiting 09/09/2022   Obesity (BMI 30-39.9) 09/09/2022   Personal history of cecal colon cancer 09/09/2022   Past Surgical History:  Procedure Laterality Date   APPENDECTOMY     CHOLECYSTECTOMY N/A 09/11/2022   Procedure: LAPAROSCOPIC CHOLECYSTECTOMY;  Surgeon: Allen Keens, MD;  Location: [facility];  Service: General;  Laterality: N/A;   COLON RESECTION N/A 11/22/2016   Procedure: HAND ASSISTED LAPAROSCOPIC COLON RESECTION;  Surgeon: Allen Confer, MD;  Location: [facility];  Service: General;  Laterality: N/A;   COLONOSCOPY N/A 11/17/2016   Procedure: COLONOSCOPY;  Surgeon: Allen Artist, MD;  Location: [facility]COPY;  Service: Endoscopy;  Laterality: N/A;   FRACTURE SURGERY     MULTIPLE TOOTH EXTRACTIONS     RECTAL EXAM UNDER ANESTHESIA N/A 12/05/2016   Procedure: RECTAL EXAM UNDER ANESTHESIA, DISIMPACTION;  Surgeon: Allen Overall, MD;  Location: [facility];  Service:  General;  Laterality: N/A;   Social History   Socioeconomic History   Marital status: Single    Spouse name: Not on file   Number of children: Not on file   Years of education: Not on file   Highest education level: Not on file  Occupational History   Occupation: unemployed  Tobacco Use   Smoking status: Former    Types: Pipe    Quit date: 09/14/1983    Years since quitting: 39.1   Smokeless tobacco: Never  Substance and Sexual Activity   Alcohol use: No   Drug use: No   Sexual activity: Never    Birth control/protection: Condom  Other Topics Concern   Not on file  Social History Narrative   Not on file   Social Determinants of Health   Financial Resource Strain: Not on file  Food Insecurity: No Food Insecurity (10/27/2022)   Hunger Vital Sign    Worried About Running Out of Food in the Last Year: Never true    Ran Out of Food in the Last Year: Never true  Transportation Needs: No Transportation Needs (10/28/2022)   PRAPARE - Hydrologist (Medical): No    Reese of Transportation (Non-Medical): No  Recent Concern: Transportation Needs - Unmet Transportation Needs (10/27/2022)   PRAPARE - Hydrologist (Medical): No    Reese of Transportation (Non-Medical): Yes  Physical Activity: Not on file  Stress: Not on file  Social Connections: Not on file   Family History  Problem Relation Age of Onset   Brain cancer Mother    Alzheimer's disease Father    Cancer Maternal Grandmother    Heart attack Maternal Grandfather    Pneumonia Paternal Grandfather    - negative except otherwise stated in the family history section Allergies  Allergen Reactions   Other Itching and Other (See Comments)    [name] bandage (self-gripping) - erythema, itching   Prior to Admission medications   Medication Sig Start Date End Date Taking? Authorizing Provider  apixaban (ELIQUIS) 5 MG TABS tablet Take 1 tablet (5 mg total)  by mouth 2 (two) times daily. 02/02/22  Yes Allen Dubonnet, NP  atorvastatin (LIPITOR) 20 MG tablet Take 20 mg by mouth daily. 02/02/21  Yes [provider]  cyanocobalamin 1000 MCG tablet Take 1 tablet (1,000 mcg total) by mouth daily. 09/15/22  Yes Allen Riding, MD  dextrose (GLUTOSE) 40 % GEL Take 1 Tube by mouth once as needed for low blood sugar. Give Glucose Gel by mouth for BS<70. Repeat CBG in 16 minutes, Repeat Gel & Recheck CBG in 15 minutes. Call MD if Deemed Necessary. Call 911 if Resident is Symptomatic, Not Alert or Unconscious   Yes [provider]  docusate sodium (COLACE) 100 MG capsule Take 1 capsule (100 mg total) by mouth 2 (two) times daily. 12/08/16  Yes Allen Dubois, MD  folic acid (FOLVITE) 1 MG tablet Take 1 mg by mouth See admin instructions. Take 1 mg by mouth at 8 AM daily on Sun/Mon/Tues/Wed/Thurs/Sat ONLY 01/29/21  Yes [provider]  furosemide (LASIX) 80 MG tablet Take 80 mg by mouth daily. 08/26/22  Yes [provider]  hydrOXYzine (ATARAX/VISTARIL) 25 MG tablet Take 1 tablet (25 mg total) by mouth every 8 (eight) hours as needed for itching. Use caution as this medication may make you drowsy Patient taking differently: Take 25 mg by mouth every 8 (eight) hours as needed for itching ("Use caution as this medication may make you drowsy"). 07/17/20  Yes Reese, Allen Overland, MD  JANUVIA 100 MG tablet Take 100 mg by mouth daily. 01/20/21  Yes [provider]  metFORMIN (GLUCOPHAGE) 1000 MG tablet Take 1,000 mg by mouth 2 (two) times daily with a meal. 01/23/21  Yes [provider]  methotrexate (RHEUMATREX) 2.5 MG tablet Take 10 mg by mouth every Friday. 01/29/21  Yes [provider]  NONFORMULARY OR COMPOUNDED ITEM Apply 1 application  topically See admin instructions. Triamcinolone 0.1% cream + Cetaphil cream 1:4 - apply to lower legs once a day   Yes [provider]  pantoprazole (PROTONIX) 40 MG tablet  Take 1 tablet (40 mg total) by mouth daily. Patient taking differently: Take 40 mg by mouth daily before breakfast. 12/26/21  Yes Reese, Ava, DO  polyethylene glycol (MIRALAX) packet Take 17 g by mouth daily. Hold for diarrhea Patient taking differently: Take 17 g by mouth See admin instructions. Mix 17 grams of powder into 4-8 ounces of water and drink by mouth once a day. Hold for diarrhea. 12/08/16  Yes Allen Dubois, MD  potassium chloride SA (KLOR-CON M) 20 MEQ tablet Take 20 mEq by mouth daily. 08/26/22  Yes [provider]  senna-docusate (SENOKOT-S) 8.6-50 MG tablet Take 1 tablet by mouth 2 (two) times daily between meals as needed for moderate constipation. 09/13/22  Yes Allen Riding, MD  tamsulosin (FLOMAX) 0.4 MG CAPS capsule Take 1 capsule (0.4 mg total) by mouth daily. 11/27/16  Yes [place],  Clifton James, MD  triamcinolone (KENALOG) 0.1 % Apply 1 application  topically See admin instructions. Apply to itchy areas 2 times a day and and affected areas of the body as needed for dermatitis   Yes [provider]  zinc oxide 20 % ointment Apply 1 Application topically See admin instructions. Apply to left calf area 2 times a day 10/18/22 11/15/22 Yes [provider]  carbamide peroxide (DEBROX) 6.5 % OTIC solution Place 5 drops into the right ear 2 (two) times daily. Patient not taking: Reported on 10/27/2022 12/25/21   Reese, Ava, DO  oxyCODONE (OXY IR/ROXICODONE) 5 MG immediate release tablet Take 1 tablet (5 mg total) by mouth every 6 (six) hours as needed for severe pain. Patient not taking: Reported on 10/27/2022 09/13/22   Margie Billet A, PA-C  terbinafine (LAMISIL) 1 % cream Apply topically 2 (two) times daily. Patient not taking: Reported on 10/27/2022 09/13/22   Allen Riding, MD   No results found. - pertinent xrays, CT, MRI studies were reviewed and independently interpreted  Positive ROS: All other systems have been reviewed and were otherwise negative with the  exception of those mentioned in the HPI and as above.  Physical Exam: General: Alert, no acute distress Psychiatric: Patient is competent for consent with normal mood and affect Lymphatic: No axillary or cervical lymphadenopathy Cardiovascular: No pedal edema Respiratory: No cyanosis, no use of accessory musculature GI: No organomegaly, abdomen is soft and non-tender    Images:  '@ENCIMAGES'$ @  Labs:  Lab Results  Component Value Date   HGBA1C 5.3 09/09/2022   HGBA1C 5.5 12/22/2021   HGBA1C 4.9 12/04/2016   ESRSEDRATE 135 (H) 10/27/2022   CRP 3.4 (H) 10/27/2022   REPTSTATUS PENDING 10/27/2022   CULT  10/27/2022    NO GROWTH 3 DAYS Performed at [facility], [place] [address] [place], [place] [address]    LABORGA No Herpes Simplex Virus detected. 05/25/2013   LABORGA No Virus Isolated in Cell Culture 05/25/2013   LABORGA  05/25/2013      A negative result does not exclude the possibility of   LABORGA  05/25/2013    virus infection;inappropriate specimen collection,storage   LABORGA  05/25/2013    and transport may lead to false negative culture results.    Lab Results  Component Value Date   ALBUMIN 2.8 (L) 10/28/2022   ALBUMIN 3.6 10/27/2022   ALBUMIN 2.6 (L) 09/13/2022   PREALBUMIN 10 (L) 09/09/2022   PREALBUMIN 12.5 (L) 11/25/2016        Latest Ref Rng & Units 10/30/2022    2:54 AM 10/29/2022    5:37 AM 10/28/2022    5:33 AM  CBC EXTENDED  WBC 4.0 - 10.5 K/uL 4.4  3.7  3.6   RBC 4.22 - 5.81 MIL/uL 2.49  2.50  2.46   Hemoglobin 13.0 - 17.0 g/dL 8.7  8.5  8.4   HCT 39.0 - 52.0 % 26.0  27.6  26.2   Platelets 150 - 400 K/uL 159  147  165   NEUT# 1.7 - 7.7 K/uL   2.7   Lymph# 0.7 - 4.0 K/uL   0.4     Neurologic: Patient does not have protective sensation bilateral lower extremities.   MUSCULOSKELETAL:   Skin: Examination patient has dermatitis and ulceration over the dorsal midfoot on the left.  There is a drainage track on the plantar aspect of his  foot that has sealed over.  There is dermatitis and swelling of his leg  with brawny skin color changes consistent with chronic venous and lymphatic insufficiency.  Patient has a palpable dorsalis pedis pulse and ABIs a year ago showed triphasic flow with good toe pressures.  Patient's albumin is 2.8 prealbumin 10.  His lymphocyte count is 0.4.  Review of the MRI scan shows osteomyelitis and abscess involving the base of the fourth and fifth metatarsals.  Assessment: Assessment: Abscess and ulceration left midfoot with venous and lymphatic insufficiency and protein caloric malnutrition.  Plan: Plan: Discussed treatment options including foot salvage with suppressive antibiotics versus definitive treatment with a transtibial amputation.  Patient states he would like to proceed with amputation.  We will plan for surgery on Wednesday.  Anticipate patient could return to assisted living depending on his progress with therapy.  Thank you for the consult and the opportunity to see [name], [place] [phone] 9:31 AM

## 2022-10-30 NOTE — Progress Notes (Signed)
ANTICOAGULATION CONSULT NOTE - Follow Up Consult  Pharmacy Consult for Heparin (Eliquis on hold) Indication: atrial fibrillation  Allergies  Allergen Reactions   Other Itching and Other (See Comments)    [name] bandage (self-gripping) - erythema, itching    Patient Measurements: Height: '6\' 3"'$  (190.5 cm) Weight: 129.3 kg (285 lb 0.9 oz) IBW/kg (Calculated) : 84.5 Heparin Dosing Weight: 113 kg  Vital Signs: Temp: 98.3 F (36.8 C) (11/18 1921) Temp Source: Oral (11/18 1921) BP: 133/64 (11/18 1921) Pulse Rate: 71 (11/18 1621)  Labs: Recent Labs    10/28/22 0533 10/28/22 1257 10/29/22 0537 10/29/22 0715 10/30/22 0254 10/30/22 1203 10/30/22 1942  HGB 8.4*  --  8.5*  --  8.7* 9.4*  --   HCT 26.2*  --  27.6*  --  26.0* 29.0*  --   PLT 165  --  147*  --  159 174  --   APTT 90* 77*  --  84* 59*  --   --   LABPROT 15.6*  --   --   --   --   --   --   INR 1.3*  --   --   --   --   --   --   HEPARINUNFRC >1.10*  --   --  0.44 0.20* <0.10* 0.16*  CREATININE 1.28*  --  1.27*  --  1.26*  --   --      Estimated Creatinine Clearance: 77.9 mL/min (A) (by C-G formula based on SCr of 1.26 mg/dL (H)).  Assessment: 71 year old male presented with cellulitis. MRI left foot with changes of chronic appearing osteomyelitis with a bone abscess involving the base of the fifth metatarsal with associated pathologic avulsion fracture. Patient with history of atrial fibrillation on Eliquis PTA. This has been held in anticipation of potential procedures. Pharmacy consulted to manage heparin infusion while awaiting plan for surgery/procedures.  Last Eliquis dose 11/15 am PTA.   Heparin levels and aPTTs correlating 11/17 and now planning heparin levels only.  Heparin infusion rate was increased from 1850 to 2000 units/hr early this morning when heparin level was subtherapeutic (0.20). Heparin level ~12n is undetectable (< 0.1).  RN reports IV site infiltration and new IV site obtained.    Heparin level remains subtherapeutic at 0.16 11/18 PM. No reports of s/sx of bleeding and no issues with the line reported since this morning. Patient had elevated heparin level after a bolus and rate of 1850 units/hr, will increase conservatively and continue to monitor.  Goal of Therapy:  Heparin level 0.3-0.7 units/ml Monitor platelets by anticoagulation protocol: Yes   Plan:  Increase heparin drip to 2200 units/hr Heparin level in 8 hrs Daily heparin level and CBC. Eliquis on hold for procedure, possibly 11/22.  Jerilynn Birkenhead, PharmD 10/30/2022,8:59 PM

## 2022-10-30 NOTE — Progress Notes (Signed)
ANTICOAGULATION CONSULT NOTE - Follow Up Consult  Pharmacy Consult for Heparin (Eliquis on hold) Indication: atrial fibrillation  Allergies  Allergen Reactions   Other Itching and Other (See Comments)    [name] bandage (self-gripping) - erythema, itching    Patient Measurements: Height: '6\' 3"'$  (190.5 cm) Weight: 129.3 kg (285 lb 0.9 oz) IBW/kg (Calculated) : 84.5 Heparin Dosing Weight: 113 kg  Vital Signs: Temp: 98.2 F (36.8 C) (11/18 0408) BP: 128/95 (11/18 0928) Pulse Rate: 64 (11/18 0928)  Labs: Recent Labs    10/28/22 0533 10/28/22 1257 10/29/22 0537 10/29/22 0715 10/30/22 0254 10/30/22 1203  HGB 8.4*  --  8.5*  --  8.7* 9.4*  HCT 26.2*  --  27.6*  --  26.0* 29.0*  PLT 165  --  147*  --  159 174  APTT 90* 77*  --  84* 59*  --   LABPROT 15.6*  --   --   --   --   --   INR 1.3*  --   --   --   --   --   HEPARINUNFRC >1.10*  --   --  0.44 0.20* <0.10*  CREATININE 1.28*  --  1.27*  --  1.26*  --     Estimated Creatinine Clearance: 77.9 mL/min (A) (by C-G formula based on SCr of 1.26 mg/dL (H)).  Assessment: 71 year old male presented with cellulitis. MRI left foot with changes of chronic appearing osteomyelitis with a bone abscess involving the base of the fifth metatarsal with associated pathologic avulsion fracture. Patient with history of atrial fibrillation on Eliquis PTA. This has been held in anticipation of potential procedures. Pharmacy consulted to manage heparin infusion while awaiting plan for surgery/procedures.  Last Eliquis dose 11/15 am PTA.   Heparin levels and aPTTs correlating 11/17 and now planning heparin levels only.  Heparin infusion rate was increased from 1850 to 2000 units/hr early this morning when heparin level was subtherapeutic (0.20). Heparin level ~12n is undetectable (< 0.1).  RN reports IV site infiltration and new IV site obtained.   Goal of Therapy:  Heparin level 0.3-0.7 units/ml Monitor platelets by anticoagulation  protocol: Yes   Plan:  Continue heparin drip at 2000 units/hr Heparin level tonight, ~8 hrs after drip resumed. Daily heparin level and CBC. Eliquis on hold for procedure, possibly 11/22.  Arty Baumgartner, RPh 10/30/2022,2:50 PM

## 2022-10-30 NOTE — H&P (View-Only) (Signed)
ORTHOPAEDIC CONSULTATION  REQUESTING PHYSICIAN: Damita Lack, MD  Chief Complaint: Ulceration and purulent drainage left foot.  HPI: [name] is a 71 y.o. male who presents with ulceration over the dorsal and plantar aspect of the left foot.  Patient states that he has noticed some drainage from the bottom of his foot while walking at home.  Patient states he currently lives in a assisted living facility.  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  Past Medical History:  Diagnosis Date   Adenocarcinoma of colon [facility])    Atrial fibrillation ([place])    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   Cataract    Constipation, chronic 11/09/2016   Diabetes mellitus without complication ([place])    History of COVID-19 09/09/2022   History of homelessness 09/09/2022   History of kidney stones 09/09/2022   History of Stercoral ulcer of rectum 11/28/2016   Hyperlipidemia    Hypertension    Lichen planus    bilateral legs   Nausea & vomiting 09/09/2022   Obesity (BMI 30-39.9) 09/09/2022   Personal history of cecal colon cancer 09/09/2022   Past Surgical History:  Procedure Laterality Date   APPENDECTOMY     CHOLECYSTECTOMY N/A 09/11/2022   Procedure: LAPAROSCOPIC CHOLECYSTECTOMY;  Surgeon: Coralie Keens, MD;  Location: [facility];  Service: General;  Laterality: N/A;   COLON RESECTION N/A 11/22/2016   Procedure: HAND ASSISTED LAPAROSCOPIC COLON RESECTION;  Surgeon: [name], MD;  Location: [facility];  Service: General;  Laterality: N/A;   COLONOSCOPY N/A 11/17/2016   Procedure: COLONOSCOPY;  Surgeon: [name], MD;  Location: [facility];  Service: Endoscopy;  Laterality: N/A;   FRACTURE SURGERY     MULTIPLE TOOTH EXTRACTIONS     RECTAL EXAM UNDER ANESTHESIA N/A 12/05/2016   Procedure: RECTAL EXAM UNDER ANESTHESIA, DISIMPACTION;  Surgeon: Alphonsa Overall, MD;  Location: [facility];  Service:  General;  Laterality: N/A;   Social History   Socioeconomic History   Marital status: Single    Spouse name: Not on file   Number of children: Not on file   Years of education: Not on file   Highest education level: Not on file  Occupational History   Occupation: unemployed  Tobacco Use   Smoking status: Former    Types: Pipe    Quit date: 09/14/1983    Years since quitting: 39.1   Smokeless tobacco: Never  Substance and Sexual Activity   Alcohol use: No   Drug use: No   Sexual activity: Never    Birth control/protection: Condom  Other Topics Concern   Not on file  Social History Narrative   Not on file   Social Determinants of Health   Financial Resource Strain: Not on file  Food Insecurity: No Food Insecurity (10/27/2022)   Hunger Vital Sign    Worried About Running Out of Food in the Last Year: Never true    Ran Out of Food in the Last Year: Never true  Transportation Needs: No Transportation Needs (10/28/2022)   PRAPARE - Hydrologist (Medical): No    Lack of Transportation (Non-Medical): No  Recent Concern: Transportation Needs - Unmet Transportation Needs (10/27/2022)   PRAPARE - Hydrologist (Medical): No    Lack of Transportation (Non-Medical): Yes  Physical Activity: Not on file  Stress: Not on file  Social Connections: Not on file   Family History  Problem Relation Age of Onset   Allen cancer Mother    Alzheimer's disease Father    Cancer Maternal Grandmother    Heart attack Maternal Grandfather    Pneumonia Paternal Grandfather    - negative except otherwise stated in the family history section Allergies  Allergen Reactions   Other Itching and Other (See Comments)    [name] bandage (self-gripping) - erythema, itching   Prior to Admission medications   Medication Sig Start Date End Date Taking? Authorizing Provider  apixaban (ELIQUIS) 5 MG TABS tablet Take 1 tablet (5 mg total)  by mouth 2 (two) times daily. 02/02/22  Yes Loel Dubonnet, NP  atorvastatin (LIPITOR) 20 MG tablet Take 20 mg by mouth daily. 02/02/21  Yes [provider]  cyanocobalamin 1000 MCG tablet Take 1 tablet (1,000 mcg total) by mouth daily. 09/15/22  Yes Mercy Riding, MD  dextrose (GLUTOSE) 40 % GEL Take 1 Tube by mouth once as needed for low blood sugar. Give Glucose Gel by mouth for BS<70. Repeat CBG in 16 minutes, Repeat Gel & Recheck CBG in 15 minutes. Call MD if Deemed Necessary. Call 911 if Resident is Symptomatic, Not Alert or Unconscious   Yes [provider]  docusate sodium (COLACE) 100 MG capsule Take 1 capsule (100 mg total) by mouth 2 (two) times daily. 12/08/16  Yes Barton Dubois, MD  folic acid (FOLVITE) 1 MG tablet Take 1 mg by mouth See admin instructions. Take 1 mg by mouth at 8 AM daily on Sun/Mon/Tues/Wed/Thurs/Sat ONLY 01/29/21  Yes [provider]  furosemide (LASIX) 80 MG tablet Take 80 mg by mouth daily. 08/26/22  Yes [provider]  hydrOXYzine (ATARAX/VISTARIL) 25 MG tablet Take 1 tablet (25 mg total) by mouth every 8 (eight) hours as needed for itching. Use caution as this medication may make you drowsy Patient taking differently: Take 25 mg by mouth every 8 (eight) hours as needed for itching ("Use caution as this medication may make you drowsy"). 07/17/20  Yes Little, Wenda Overland, MD  JANUVIA 100 MG tablet Take 100 mg by mouth daily. 01/20/21  Yes [provider]  metFORMIN (GLUCOPHAGE) 1000 MG tablet Take 1,000 mg by mouth 2 (two) times daily with a meal. 01/23/21  Yes [provider]  methotrexate (RHEUMATREX) 2.5 MG tablet Take 10 mg by mouth every Friday. 01/29/21  Yes [provider]  NONFORMULARY OR COMPOUNDED ITEM Apply 1 application  topically See admin instructions. Triamcinolone 0.1% cream + Cetaphil cream 1:4 - apply to lower legs once a day   Yes [provider]  pantoprazole (PROTONIX) 40 MG tablet  Take 1 tablet (40 mg total) by mouth daily. Patient taking differently: Take 40 mg by mouth daily before breakfast. 12/26/21  Yes Swayze, Ava, DO  polyethylene glycol (MIRALAX) packet Take 17 g by mouth daily. Hold for diarrhea Patient taking differently: Take 17 g by mouth See admin instructions. Mix 17 grams of powder into 4-8 ounces of water and drink by mouth once a day. Hold for diarrhea. 12/08/16  Yes Barton Dubois, MD  potassium chloride SA (KLOR-CON M) 20 MEQ tablet Take 20 mEq by mouth daily. 08/26/22  Yes [provider]  senna-docusate (SENOKOT-S) 8.6-50 MG tablet Take 1 tablet by mouth 2 (two) times daily between meals as needed for moderate constipation. 09/13/22  Yes Mercy Riding, MD  tamsulosin (FLOMAX) 0.4 MG CAPS capsule Take 1 capsule (0.4 mg total) by mouth daily. 11/27/16  Yes [place],  Clifton James, MD  triamcinolone (KENALOG) 0.1 % Apply 1 application  topically See admin instructions. Apply to itchy areas 2 times a day and and affected areas of the body as needed for dermatitis   Yes [provider]  zinc oxide 20 % ointment Apply 1 Application topically See admin instructions. Apply to left calf area 2 times a day 10/18/22 11/15/22 Yes [provider]  carbamide peroxide (DEBROX) 6.5 % OTIC solution Place 5 drops into the right ear 2 (two) times daily. Patient not taking: Reported on 10/27/2022 12/25/21   Swayze, Ava, DO  oxyCODONE (OXY IR/ROXICODONE) 5 MG immediate release tablet Take 1 tablet (5 mg total) by mouth every 6 (six) hours as needed for severe pain. Patient not taking: Reported on 10/27/2022 09/13/22   Margie Billet A, PA-C  terbinafine (LAMISIL) 1 % cream Apply topically 2 (two) times daily. Patient not taking: Reported on 10/27/2022 09/13/22   Mercy Riding, MD   No results found. - pertinent xrays, CT, MRI studies were reviewed and independently interpreted  Positive ROS: All other systems have been reviewed and were otherwise negative with the  exception of those mentioned in the HPI and as above.  Physical Exam: General: Alert, no acute distress Psychiatric: Patient is competent for consent with normal mood and affect Lymphatic: No axillary or cervical lymphadenopathy Cardiovascular: No pedal edema Respiratory: No cyanosis, no use of accessory musculature GI: No organomegaly, abdomen is soft and non-tender    Images:  '@ENCIMAGES'$ @  Labs:  Lab Results  Component Value Date   HGBA1C 5.3 09/09/2022   HGBA1C 5.5 12/22/2021   HGBA1C 4.9 12/04/2016   ESRSEDRATE 135 (H) 10/27/2022   CRP 3.4 (H) 10/27/2022   REPTSTATUS PENDING 10/27/2022   CULT  10/27/2022    NO GROWTH 3 DAYS Performed at [facility], [place] [address] [place], [place] [address]    LABORGA No Herpes Simplex Virus detected. 05/25/2013   LABORGA No Virus Isolated in Cell Culture 05/25/2013   LABORGA  05/25/2013      A negative result does not exclude the possibility of   LABORGA  05/25/2013    virus infection;inappropriate specimen collection,storage   LABORGA  05/25/2013    and transport may lead to false negative culture results.    Lab Results  Component Value Date   ALBUMIN 2.8 (L) 10/28/2022   ALBUMIN 3.6 10/27/2022   ALBUMIN 2.6 (L) 09/13/2022   PREALBUMIN 10 (L) 09/09/2022   PREALBUMIN 12.5 (L) 11/25/2016        Latest Ref Rng & Units 10/30/2022    2:54 AM 10/29/2022    5:37 AM 10/28/2022    5:33 AM  CBC EXTENDED  WBC 4.0 - 10.5 K/uL 4.4  3.7  3.6   RBC 4.22 - 5.81 MIL/uL 2.49  2.50  2.46   Hemoglobin 13.0 - 17.0 g/dL 8.7  8.5  8.4   HCT 39.0 - 52.0 % 26.0  27.6  26.2   Platelets 150 - 400 K/uL 159  147  165   NEUT# 1.7 - 7.7 K/uL   2.7   Lymph# 0.7 - 4.0 K/uL   0.4     Neurologic: Patient does not have protective sensation bilateral lower extremities.   MUSCULOSKELETAL:   Skin: Examination patient has dermatitis and ulceration over the dorsal midfoot on the left.  There is a drainage track on the plantar aspect of his  foot that has sealed over.  There is dermatitis and swelling of his leg  with brawny skin color changes consistent with chronic venous and lymphatic insufficiency.  Patient has a palpable dorsalis pedis pulse and ABIs a year ago showed triphasic flow with good toe pressures.  Patient's albumin is 2.8 prealbumin 10.  His lymphocyte count is 0.4.  Review of the MRI scan shows osteomyelitis and abscess involving the base of the fourth and fifth metatarsals.  Assessment: Assessment: Abscess and ulceration left midfoot with venous and lymphatic insufficiency and protein caloric malnutrition.  Plan: Plan: Discussed treatment options including foot salvage with suppressive antibiotics versus definitive treatment with a transtibial amputation.  Patient states he would like to proceed with amputation.  We will plan for surgery on Wednesday.  Anticipate patient could return to assisted living depending on his progress with therapy.  Thank you for the consult and the opportunity to see Mr. Kyen Taite, [place] [phone] 9:31 AM

## 2022-10-30 NOTE — Plan of Care (Signed)

## 2022-10-30 NOTE — Progress Notes (Signed)
PROGRESS NOTE    [name]  UJW:[id number] DOB: [birth year] DOA: 10/27/2022 PCP: Sandi Mariscal, MD   Brief Narrative:  71 y.o. male with PMH significant for obesity, DM2, HTN, HLD, A-fib on Eliquis, colon cancer, chronic anemia BPH, chronic bilateral lower extremity lymphedema with venous stasis ulcers chronically on Lasix, who is a resident at [facility] facility.  Initially sent to ED on 11/15 for left foot erythema, swelling and drainage to [facility].  X-ray of the foot showed concerns of possible osteomyelitis, MRI left foot showed chronic osteomyelitis with bony abscess involving fifth metatarsal and pathologic avulsion fracture.  There is also evidence of diffuse cellulitis and myositis.  Patient was started on IV antibiotics.   Assessment & Plan:  Principal Problem:   Cellulitis of left foot Active Problems:   Paroxysmal atrial fibrillation (HCC)   HLD (hyperlipidemia)   DM2 (diabetes mellitus, type 2) (HCC)   BPH (benign prostatic hyperplasia)   Anemia of chronic disease   Left foot cellulitis and myositis Chronic osteomyelitis/abscess of fifth metatarsal with associated fracture Septic arthritis of the fifth metatarsal cuboid joint Presented with worsening left foot wound symptoms in the setting of diabetes mellitus and chronic lymphedema. X-ray and MRI findings as above showing evidence of cellulitis, myositis, osteomyelitis and septic arthritis. Cultures have remained negative, continue current antibiotic-vancomycin and cefepime. - Patient transferred for orthopedic recommendation, Dr. Sharol Given to [facility] for further intervention.  Surgery is tentatively planned for Wednesday.  Chronic bilateral lower extremity lymphedema Chronic venous stasis ulcers PTA on Lasix 80 mg daily. Currently continued on lasix. Most recent echo from September 2023 with preserved EF Wound care consult appreciated.  Continue local wound care   CKD 2 Creatinine at  baseline.  Continue to monitor  Paroxysmal A-fib  Chronically on Eliquis but for now on heparin drip perioperatively.  Not on any AV nodal blocker.  We will place on IV Lopressor.   Type 2 diabetes mellitus A1c 5.3 on 09/09/2022.  Home medication metformin and Januvia on hold.  Continue sliding scale and Accu-Cheks. PTA on metformin 1000 mg twice daily, Januvia 100 mg daily.  Currently on hold.  Hyperlipidemia -Lipitor   History of colon cancer Status post colon resection in the past.   Chronic macrocytic anemia  Chronic vitamin B12 deficiency Baseline hemoglobin close to 8.  No active bleeding at this time.  Continue monitor hemoglobin. Continue vitamin F62, folic acid supplementation Continue Protonix 40 mg daily.  BPH Continue Flomax 0.4 mg daily   Chronic constipation Bowel regimen with the scheduled Senokot, as needed MiraLAX   ?? Rheumatoid arthritis Home medication on hold  DVT prophylaxis: Currently on heparin drip Code Status: Full code Family Communication:    Status is: Inpatient Remains inpatient appropriate because: Continuing hospital stay for IV antibiotics, surgery tentatively planned for Wednesday.   Nutritional status    Signs/Symptoms: estimated needs  Interventions: MVI, Liberalize Diet  Body mass index is 35.63 kg/m.         Subjective:  Patient feeling okay, does have any complaints at this time.  Examination:  General exam: Appears calm and comfortable  Respiratory system: Clear to auscultation. Respiratory effort normal. Cardiovascular system: S1 & S2 heard, RRR. No JVD, murmurs, rubs, gallops or clicks. No pedal edema. Gastrointestinal system: Abdomen is nondistended, soft and nontender. No organomegaly or masses felt. Normal bowel sounds heard. Central nervous system: Alert and oriented. No focal neurological deficits. Extremities: Symmetric 5 x 5 power. Skin: Bilateral lower  extremity chronic skin changes with some  ulceration.  Has some erythema and slightly warm of his left lower extremity.  Chronic lymphatic insufficiency noted as well. Psychiatry: Judgement and insight appear normal. Mood & affect appropriate.     Objective: Vitals:   10/29/22 1223 10/29/22 2041 10/30/22 0026 10/30/22 0408  BP: 116/63 (!) 131/46 128/82 (!) 125/57  Pulse: 71 66 82 (!) 56  Resp: '19 16 19 16  '$ Temp: 98 F (36.7 C) 98.7 F (37.1 C) 98.8 F (37.1 C) 98.2 F (36.8 C)  TempSrc: Oral Oral Oral   SpO2: 96% 97% 96% 97%  Weight:   129.3 kg   Height:   '6\' 3"'$  (1.905 m)     Intake/Output Summary (Last 24 hours) at 10/30/2022 0920 Last data filed at 10/30/2022 0909 Gross per 24 hour  Intake 1771.37 ml  Output 2100 ml  Net -328.63 ml   Filed Weights   10/27/22 1330 10/29/22 0500 10/30/22 0026  Weight: (!) 138 kg 131.3 kg 129.3 kg     Data Reviewed:   CBC: Recent Labs  Lab 10/27/22 1338 10/28/22 0533 10/29/22 0537 10/30/22 0254  WBC 4.9 3.6* 3.7* 4.4  NEUTROABS 4.0 2.7  --   --   HGB 10.1* 8.4* 8.5* 8.7*  HCT 31.0* 26.2* 27.6* 26.0*  MCV 105.4* 106.5* 110.4* 104.4*  PLT 218 165 147* 929   Basic Metabolic Panel: Recent Labs  Lab 10/27/22 1338 10/28/22 0533 10/29/22 0537 10/30/22 0254  NA 136 136 134* 138  K 3.7 3.5 3.7 4.0  CL 97* 102 103 103  CO2 '29 26 24 23  '$ GLUCOSE 117* 187* 133* 181*  BUN '20 19 20 20  '$ CREATININE 1.27* 1.28* 1.27* 1.26*  CALCIUM 8.7* 8.3* 8.2* 8.6*  MG 1.8 1.7  --   --    GFR: Estimated Creatinine Clearance: 77.9 mL/min (A) (by C-G formula based on SCr of 1.26 mg/dL (H)). Liver Function Tests: Recent Labs  Lab 10/27/22 1338 10/28/22 0533  AST 15 13*  ALT 10 10  ALKPHOS 81 69  BILITOT 0.8 0.5  PROT 8.6* 7.1  ALBUMIN 3.6 2.8*   No results for input(s): "LIPASE", "AMYLASE" in the last 168 hours. No results for input(s): "AMMONIA" in the last 168 hours. Coagulation Profile: Recent Labs  Lab 10/28/22 0533  INR 1.3*   Cardiac Enzymes: No results for  input(s): "CKTOTAL", "CKMB", "CKMBINDEX", "TROPONINI" in the last 168 hours. BNP (last 3 results) No results for input(s): "PROBNP" in the last 8760 hours. HbA1C: No results for input(s): "HGBA1C" in the last 72 hours. CBG: Recent Labs  Lab 10/29/22 1120 10/29/22 1625 10/29/22 2134 10/30/22 0027 10/30/22 0738  GLUCAP 116* 148* 222* 199* 140*   Lipid Profile: No results for input(s): "CHOL", "HDL", "LDLCALC", "TRIG", "CHOLHDL", "LDLDIRECT" in the last 72 hours. Thyroid Function Tests: No results for input(s): "TSH", "T4TOTAL", "FREET4", "T3FREE", "THYROIDAB" in the last 72 hours. Anemia Panel: No results for input(s): "VITAMINB12", "FOLATE", "FERRITIN", "TIBC", "IRON", "RETICCTPCT" in the last 72 hours. Sepsis Labs: Recent Labs  Lab 10/27/22 1510  LATICACIDVEN 1.0    Recent Results (from the past 240 hour(s))  Blood Cultures x 2 sites     Status: None (Preliminary result)   Collection Time: 10/27/22  1:38 PM   Specimen: BLOOD  Result Value Ref Range Status   Specimen Description   Final    BLOOD LEFT ANTECUBITAL Performed at [place] [address] [place], [place] [address]    Special Requests  Final    BOTTLES DRAWN AEROBIC AND ANAEROBIC Blood Culture adequate volume Performed at [facility] [address] [place], [place] [address]    Culture   Final    NO GROWTH 3 DAYS Performed at [facility], [place] [address], [place], [place] [address]    Report Status PENDING  Incomplete  Blood Cultures x 2 sites     Status: None (Preliminary result)   Collection Time: 10/27/22  7:50 PM   Specimen: Left Antecubital; Blood  Result Value Ref Range Status   Specimen Description   Final    LEFT ANTECUBITAL BLOOD Performed at [facility], [place] [address], [place], [place] [address]    Special Requests   Final    BOTTLES DRAWN AEROBIC AND ANAEROBIC Blood Culture adequate volume Performed at [facility] [address], [place], [place] [address]    Culture   Final    NO GROWTH 3 DAYS Performed at [facility], [place] [address], [place], [place] [address]    Report Status PENDING  Incomplete  MRSA Next Gen by PCR, Nasal     Status: None   Collection Time: 10/28/22  5:57 PM   Specimen: Nasal Mucosa; Nasal Swab  Result Value Ref Range Status   MRSA by PCR Next Gen NOT DETECTED NOT DETECTED Final    Comment: (NOTE) The GeneXpert MRSA Assay (FDA approved for NASAL specimens only), is one component of a comprehensive MRSA colonization surveillance program. It is not intended to diagnose MRSA infection nor to guide or monitor treatment for MRSA infections. Test performance is not FDA approved in patients less than 26 years old. Performed at [facility], [place] [address] [place], [place] [address]          Radiology Studies: No results found.      Scheduled Meds:  atorvastatin  20 mg Oral Daily   docusate sodium  100 mg Oral BID   furosemide  80 mg Oral Daily   insulin aspart  0-9 Units Subcutaneous TID WC   multivitamin  1 tablet Oral QHS   pantoprazole  40 mg Oral Daily   tamsulosin  0.4 mg Oral Daily   Continuous Infusions:  ceFEPime (MAXIPIME) IV 2 g (10/30/22 0218)   heparin 2,000 Units/hr (10/30/22 6759)   metronidazole 500 mg (10/30/22 0051)   vancomycin 2,000 mg (10/30/22 0410)     LOS: 3 days   Time spent= 35 mins    Jaret Coppedge Arsenio Loader, MD [facility]  If 7PM-7AM, please contact night-coverage  10/30/2022, 9:20 AM

## 2022-10-30 NOTE — Progress Notes (Signed)
ANTICOAGULATION CONSULT NOTE  Pharmacy Consult for heparin Indication: atrial fibrillation  Allergies  Allergen Reactions   Other Itching and Other (See Comments)    Wynetta [employer] bandage (self-gripping) - erythema, itching    Patient Measurements: Height: '6\' 3"'$  (190.5 cm) Weight: 129.3 kg (285 lb 0.9 oz) IBW/kg (Calculated) : 84.5  Vital Signs: Temp: 98.8 F (37.1 C) (11/18 0026) Temp Source: Oral (11/18 0026) BP: 128/82 (11/18 0026) Pulse Rate: 82 (11/18 0026)  Labs: Recent Labs    10/28/22 0533 10/28/22 1257 10/29/22 0537 10/29/22 0715 10/30/22 0254  HGB 8.4*  --  8.5*  --  8.7*  HCT 26.2*  --  27.6*  --  26.0*  PLT 165  --  147*  --  159  APTT 90* 77*  --  84* 59*  LABPROT 15.6*  --   --   --   --   INR 1.3*  --   --   --   --   HEPARINUNFRC >1.10*  --   --  0.44 0.20*  CREATININE 1.28*  --  1.27*  --  1.26*     Estimated Creatinine Clearance: 77.9 mL/min (A) (by C-G formula based on SCr of 1.26 mg/dL (H)).  Assessment: 71 year old male presented with cellulitis. MRI left foot with changes of chronic appearing osteomyelitis with a bone abscess involving the base of the fifth metatarsal with associated pathologic avulsion fracture. Patient with history of atrial fibrillation on Eliquis PTA. This has been held in anticipation of potential procedures. Pharmacy consulted to manage heparin infusion while awaiting plan for surgery/procedures.   11/18 AM update:  Heparin level low No need for further aPTTs  Goal of Therapy:  Heparin level 0.3-0.7 units/mL Monitor platelets by anticoagulation protocol: Yes   Plan:  Inc heparin to 2000 units/hr 1200 heparin level  Narda Bonds, PharmD, BCPS Clinical Pharmacist Phone: [phone]

## 2022-10-31 DIAGNOSIS — L03116 Cellulitis of left lower limb: Secondary | ICD-10-CM | POA: Diagnosis not present

## 2022-10-31 LAB — MAGNESIUM: Magnesium: 1.8 mg/dL (ref 1.7–2.4)

## 2022-10-31 LAB — CBC
HCT: 26.5 % — ABNORMAL LOW (ref 39.0–52.0)
Hemoglobin: 8.8 g/dL — ABNORMAL LOW (ref 13.0–17.0)
MCH: 35.1 pg — ABNORMAL HIGH (ref 26.0–34.0)
MCHC: 33.2 g/dL (ref 30.0–36.0)
MCV: 105.6 fL — ABNORMAL HIGH (ref 80.0–100.0)
Platelets: 154 10*3/uL (ref 150–400)
RBC: 2.51 MIL/uL — ABNORMAL LOW (ref 4.22–5.81)
RDW: 17.9 % — ABNORMAL HIGH (ref 11.5–15.5)
WBC: 3.9 10*3/uL — ABNORMAL LOW (ref 4.0–10.5)
nRBC: 0 % (ref 0.0–0.2)

## 2022-10-31 LAB — HEPARIN LEVEL (UNFRACTIONATED): Heparin Unfractionated: 0.15 IU/mL — ABNORMAL LOW (ref 0.30–0.70)

## 2022-10-31 LAB — BASIC METABOLIC PANEL
Anion gap: 12 (ref 5–15)
BUN: 18 mg/dL (ref 8–23)
CO2: 23 mmol/L (ref 22–32)
Calcium: 8.4 mg/dL — ABNORMAL LOW (ref 8.9–10.3)
Chloride: 99 mmol/L (ref 98–111)
Creatinine, Ser: 1.11 mg/dL (ref 0.61–1.24)
GFR, Estimated: >60 mL/min (ref >60)
Glucose, Bld: 185 mg/dL — ABNORMAL HIGH (ref 70–99)
Potassium: 3.8 mmol/L (ref 3.5–5.1)
Sodium: 134 mmol/L — ABNORMAL LOW (ref 135–145)

## 2022-10-31 LAB — GLUCOSE, CAPILLARY
Glucose-Capillary: 194 mg/dL — ABNORMAL HIGH (ref 70–99)
Glucose-Capillary: 197 mg/dL — ABNORMAL HIGH (ref 70–99)
Glucose-Capillary: 235 mg/dL — ABNORMAL HIGH (ref 70–99)
Glucose-Capillary: 235 mg/dL — ABNORMAL HIGH (ref 70–99)

## 2022-10-31 MED ORDER — ENOXAPARIN SODIUM 150 MG/ML IJ SOSY
130.0000 mg | PREFILLED_SYRINGE | Freq: Two times a day (BID) | INTRAMUSCULAR | Status: AC
  Administered 2022-10-31 – 2022-11-01 (×3): 130 mg via SUBCUTANEOUS
  Filled 2022-10-31 (×3): qty 0.86

## 2022-10-31 MED ORDER — INSULIN GLARGINE-YFGN 100 UNIT/ML ~~LOC~~ SOLN
10.0000 [IU] | Freq: Every day | SUBCUTANEOUS | Status: DC
  Administered 2022-10-31: 10 [IU] via SUBCUTANEOUS
  Filled 2022-10-31 (×2): qty 0.1

## 2022-10-31 NOTE — Progress Notes (Signed)
ANTICOAGULATION CONSULT NOTE - Follow Up Consult  Pharmacy Consult for Heparin (Eliquis on hold) Indication: atrial fibrillation  Allergies  Allergen Reactions   Other Itching and Other (See Comments)    [name] bandage (self-gripping) - erythema, itching    Patient Measurements: Height: '6\' 3"'$  (190.5 cm) Weight: 129.3 kg (285 lb 0.9 oz) IBW/kg (Calculated) : 84.5 Heparin Dosing Weight: 113 kg  Vital Signs: Temp: 97.7 F (36.5 C) (11/19 0839) Temp Source: Oral (11/19 0839) BP: 111/59 (11/19 0839) Pulse Rate: 53 (11/19 0839)  Labs: Recent Labs    10/28/22 1257 10/29/22 0537 10/29/22 0537 10/29/22 0715 10/30/22 0254 10/30/22 1203 10/30/22 1942 10/31/22 0934  HGB  --  8.5*   < >  --  8.7* 9.4*  --  8.8*  HCT  --  27.6*   < >  --  26.0* 29.0*  --  26.5*  PLT  --  147*   < >  --  159 174  --  154  APTT 77*  --   --  84* 59*  --   --   --   HEPARINUNFRC  --   --    < > 0.44 0.20* <0.10* 0.16* 0.15*  CREATININE  --  1.27*  --   --  1.26*  --   --  1.11   < > = values in this interval not displayed.     Estimated Creatinine Clearance: 88.4 mL/min (by C-G formula based on SCr of 1.11 mg/dL).  Assessment: 71 year old male presented with cellulitis. MRI left foot with changes of chronic appearing osteomyelitis with a bone abscess involving the base of the fifth metatarsal with associated pathologic avulsion fracture. Patient with history of atrial fibrillation on Eliquis PTA. This has been held in anticipation of potential procedures. Pharmacy consulted to manage heparin infusion while awaiting plan for surgery/procedures.  Last Eliquis dose 11/15 am PTA.   Heparin levels and aPTTs correlating 11/17 and now planning heparin levels only.  Heparin infusion rate was increased from 2000 to 2200 units/hr last night when heparin level was subtherapeutic (0.16), but this morning's level is still subtherapeutic (0.15).  RN reports no known infusion problems or  bleeding.  Goal of Therapy:  Heparin level 0.3-0.7 units/ml Monitor platelets by anticoagulation protocol: Yes   Plan:  Increase heparin drip to 20400 units/hr Heparin level ~8 hrs after rate change. Daily heparin level and CBC. Eliquis on hold for procedure, possibly 11/22.  Arty Baumgartner, [place] 10/31/2022,11:03 AM

## 2022-10-31 NOTE — Plan of Care (Signed)

## 2022-10-31 NOTE — Plan of Care (Signed)
  Problem: Education: Goal: Ability to describe self-care measures that may prevent or decrease complications (Diabetes Survival Skills Education) will improve Outcome: Progressing   

## 2022-10-31 NOTE — Progress Notes (Signed)
ANTICOAGULATION CONSULT NOTE - Follow Up Consult  Pharmacy Consult for Heparin (Eliquis on hold) > to Lovenox Indication: atrial fibrillation  Allergies  Allergen Reactions   Other Itching and Other (See Comments)    Wynetta [employer] bandage (self-gripping) - erythema, itching    Patient Measurements: Height: '6\' 3"'$  (190.5 cm) Weight: 129.3 kg (285 lb 0.9 oz) IBW/kg (Calculated) : 84.5 Heparin Dosing Weight: 113 kg  Vital Signs: Temp: 98.8 F (37.1 C) (11/19 1657) Temp Source: Oral (11/19 1657) BP: 109/66 (11/19 1657) Pulse Rate: 65 (11/19 1657)  Labs: Recent Labs    10/29/22 0537 10/29/22 0537 10/29/22 0715 10/30/22 0254 10/30/22 1203 10/30/22 1942 10/31/22 0934  HGB 8.5*  --   --  8.7* 9.4*  --  8.8*  HCT 27.6*  --   --  26.0* 29.0*  --  26.5*  PLT 147*  --   --  159 174  --  154  APTT  --   --  84* 59*  --   --   --   HEPARINUNFRC  --    < > 0.44 0.20* <0.10* 0.16* 0.15*  CREATININE 1.27*  --   --  1.26*  --   --  1.11   < > = values in this interval not displayed.     Estimated Creatinine Clearance: 88.4 mL/min (by C-G formula based on SCr of 1.11 mg/dL).  Assessment: 71 year old male presented with cellulitis. MRI left foot with changes of chronic appearing osteomyelitis with a bone abscess involving the base of the fifth metatarsal with associated pathologic avulsion fracture. Patient with history of atrial fibrillation on Eliquis PTA. This has been held in anticipation of potential procedures. Pharmacy consulted to manage heparin infusion while awaiting plan for surgery/procedures.  Last Eliquis dose 11/15 am PTA.   Heparin levels and aPTTs correlating 11/17 and now planning heparin levels only.  Heparin infusion rate was increased from 2000 to 2200 units/hr last night when heparin level was subtherapeutic (0.16), but this morning's level is still subtherapeutic (0.15).  RN reports no known infusion problems or bleeding.  1830 Addendum: To change to Lovenox  1 mg/kg SQ q12h tonight and 11/20. Per Dr. Sharol Given, needs to be held pre-op day and post-op day. Discussed w/ Dr. Reesa Chew, aware that he will be off full-anticoagulation for ~72 hrs, including OR day.   Goal of Therapy:  Heparin level 0.3-0.7 units/ml Monitor platelets by anticoagulation protocol: Yes   Plan:  Increase heparin drip to 20400 units/hr Heparin level ~8 hrs after rate change. Daily heparin level and CBC. Eliquis on hold for procedure, possibly 11/22.  1830 addendum:  Changing IV heparin to Lovenox 130 mg SQ Q12h tonight and 11/20, then hold 11/21 for OR 11/22.   Will follow up post-op for clearance to resume anticoagulation.  Arty Baumgartner, [place] 10/31/2022,11:03 am Addendum: 6:28 PM

## 2022-10-31 NOTE — Progress Notes (Signed)
PROGRESS NOTE    [name]  JOI:[id number] DOB: [birth year] DOA: 10/27/2022 PCP: Sandi Mariscal, MD   Brief Narrative:  71 y.o. male with PMH significant for obesity, DM2, HTN, HLD, A-fib on Eliquis, colon cancer, chronic anemia BPH, chronic bilateral lower extremity lymphedema with venous stasis ulcers chronically on Lasix, who is a resident at [facility] facility.  Initially sent to ED on 11/15 for left foot erythema, swelling and drainage to [facility] long hospital.  X-ray of the foot showed concerns of possible osteomyelitis, MRI left foot showed chronic osteomyelitis with bony abscess involving fifth metatarsal and pathologic avulsion fracture.  There is also evidence of diffuse cellulitis and myositis.  Patient was started on IV antibiotics ahd IV Heparin. Per Dr Sharol Given, plans for OR on Wednesday.    Assessment & Plan:  Principal Problem:   Cellulitis of left foot Active Problems:   Paroxysmal atrial fibrillation (HCC)   HLD (hyperlipidemia)   DM2 (diabetes mellitus, type 2) (HCC)   BPH (benign prostatic hyperplasia)   Anemia of chronic disease   Moderate protein-calorie malnutrition (HCC)   Osteomyelitis of left foot (HCC)   Left foot cellulitis and myositis Chronic osteomyelitis/abscess of fifth metatarsal with associated fracture Septic arthritis of the fifth metatarsal cuboid joint Presented with worsening left foot wound symptoms in the setting of diabetes mellitus and chronic lymphedema. X-ray and MRI findings as above showing evidence of cellulitis, myositis, osteomyelitis and septic arthritis. Cultures have remained negative, continue current antibiotic-vancomycin and cefepime. - Patient transferred for orthopedic recommendation, Dr. Sharol Given to [facility] for further intervention.  Surgery is tentatively planned for Wednesday.  Chronic bilateral lower extremity lymphedema Chronic venous stasis ulcers PTA on Lasix 80 mg daily. Currently continued on lasix. Most  recent echo from September 2023 with preserved EF Wound care consult appreciated.  Continue local wound care   CKD 2 Creatinine at baseline.  Continue to monitor  Paroxysmal A-fib  Chronically on Eliquis but for now on heparin drip perioperatively.  Pharmacy staff notified in case if they want to coordinate with Dr Sharol Given regarding [facility] (we can do Lovenox SQ) Not on any AV nodal blocker.  We will place on IV Lopressor.   Type 2 diabetes mellitus, uncontrolled due to hyperglycemia.  A1c 5.3 on 09/09/2022.  Home medication metformin and Januvia on hold.  Continue sliding scale and Accu-Cheks. Add Semglee 10U daily PTA on metformin 1000 mg twice daily, Januvia 100 mg daily.  Currently on hold.  Hyperlipidemia -Lipitor   History of colon cancer Status post colon resection in the past.   Chronic macrocytic anemia  Chronic vitamin B12 deficiency Baseline hemoglobin close to 8.  No active bleeding at this time.  Continue monitor hemoglobin. Continue vitamin O70, folic acid supplementation Continue Protonix 40 mg daily.  BPH Continue Flomax 0.4 mg daily   Chronic constipation Bowel regimen with the scheduled Senokot, as needed MiraLAX   ?? Rheumatoid arthritis Home medication on hold  DVT prophylaxis: Currently on heparin drip Code Status: Full code Family Communication:    Status is: Inpatient Remains inpatient appropriate because: Continuing hospital stay for IV antibiotics, surgery tentatively planned for Wednesday.   Nutritional status    Signs/Symptoms: estimated needs  Interventions: MVI, Liberalize Diet  Body mass index is 35.63 kg/m.         Subjective: No complaints, doing ok.   Examination:  Constitutional: Not in acute distress Respiratory: Clear to auscultation bilaterally Cardiovascular: Normal sinus rhythm, no rubs Abdomen: Nontender nondistended good  bowel sounds Musculoskeletal: No edema noted Skin: chronic skin changes.  Neurologic: CN 2-12  grossly intact.  And nonfocal Psychiatric: Normal judgment and insight. Alert and oriented x 3. Normal mood.    Bilateral lower extremity chronic skin changes with some ulceration.  Has some erythema and slightly warm of his left lower extremity.  Chronic lymphatic insufficiency noted as well.  Objective: Vitals:   10/30/22 0928 10/30/22 1345 10/30/22 1621 10/30/22 1921  BP: (!) 128/95 (!) 112/59 122/71 133/64  Pulse: 64 68 71   Resp: '18 17 18 17  '$ Temp: 98.4 F (36.9 C) 98.6 F (37 C) 98.4 F (36.9 C) 98.3 F (36.8 C)  TempSrc: Oral Oral Oral Oral  SpO2: 100% 100% 97% 100%  Weight:      Height:        Intake/Output Summary (Last 24 hours) at 10/31/2022 0435 Last data filed at 10/31/2022 0132 Gross per 24 hour  Intake 1715.67 ml  Output 1000 ml  Net 715.67 ml   Filed Weights   10/27/22 1330 10/29/22 0500 10/30/22 0026  Weight: (!) 138 kg 131.3 kg 129.3 kg     Data Reviewed:   CBC: Recent Labs  Lab 10/27/22 1338 10/28/22 0533 10/29/22 0537 10/30/22 0254 10/30/22 1203  WBC 4.9 3.6* 3.7* 4.4 4.3  NEUTROABS 4.0 2.7  --   --   --   HGB 10.1* 8.4* 8.5* 8.7* 9.4*  HCT 31.0* 26.2* 27.6* 26.0* 29.0*  MCV 105.4* 106.5* 110.4* 104.4* 105.8*  PLT 218 165 147* 159 696   Basic Metabolic Panel: Recent Labs  Lab 10/27/22 1338 10/28/22 0533 10/29/22 0537 10/30/22 0254  NA 136 136 134* 138  K 3.7 3.5 3.7 4.0  CL 97* 102 103 103  CO2 '29 26 24 23  '$ GLUCOSE 117* 187* 133* 181*  BUN '20 19 20 20  '$ CREATININE 1.27* 1.28* 1.27* 1.26*  CALCIUM 8.7* 8.3* 8.2* 8.6*  MG 1.8 1.7  --   --    GFR: Estimated Creatinine Clearance: 77.9 mL/min (A) (by C-G formula based on SCr of 1.26 mg/dL (H)). Liver Function Tests: Recent Labs  Lab 10/27/22 1338 10/28/22 0533  AST 15 13*  ALT 10 10  ALKPHOS 81 69  BILITOT 0.8 0.5  PROT 8.6* 7.1  ALBUMIN 3.6 2.8*   No results for input(s): "LIPASE", "AMYLASE" in the last 168 hours. No results for input(s): "AMMONIA" in the last 168  hours. Coagulation Profile: Recent Labs  Lab 10/28/22 0533  INR 1.3*   Cardiac Enzymes: No results for input(s): "CKTOTAL", "CKMB", "CKMBINDEX", "TROPONINI" in the last 168 hours. BNP (last 3 results) No results for input(s): "PROBNP" in the last 8760 hours. HbA1C: No results for input(s): "HGBA1C" in the last 72 hours. CBG: Recent Labs  Lab 10/30/22 0027 10/30/22 0738 10/30/22 1152 10/30/22 1655 10/30/22 1921  GLUCAP 199* 140* 179* 242* 236*   Lipid Profile: No results for input(s): "CHOL", "HDL", "LDLCALC", "TRIG", "CHOLHDL", "LDLDIRECT" in the last 72 hours. Thyroid Function Tests: No results for input(s): "TSH", "T4TOTAL", "FREET4", "T3FREE", "THYROIDAB" in the last 72 hours. Anemia Panel: No results for input(s): "VITAMINB12", "FOLATE", "FERRITIN", "TIBC", "IRON", "RETICCTPCT" in the last 72 hours. Sepsis Labs: Recent Labs  Lab 10/27/22 1510  LATICACIDVEN 1.0    Recent Results (from the past 240 hour(s))  Blood Cultures x 2 sites     Status: None (Preliminary result)   Collection Time: 10/27/22  1:38 PM   Specimen: BLOOD  Result Value Ref Range Status   Specimen  Description   Final    BLOOD LEFT ANTECUBITAL Performed at [facility] [address], [place], [place] [address]    Special Requests   Final    BOTTLES DRAWN AEROBIC AND ANAEROBIC Blood Culture adequate volume Performed at [facility] [address] [place], [place] [address]    Culture   Final    NO GROWTH 3 DAYS Performed at [facility], [place] [address] [place], [place] [address]    Report Status PENDING  Incomplete  Blood Cultures x 2 sites     Status: None (Preliminary result)   Collection Time: 10/27/22  7:50 PM   Specimen: Left Antecubital; Blood  Result Value Ref Range Status   Specimen Description   Final    LEFT ANTECUBITAL BLOOD Performed at [facility], [place] [address] [place], [place] [address]    Special Requests   Final     BOTTLES DRAWN AEROBIC AND ANAEROBIC Blood Culture adequate volume Performed at [facility] [address], [place], [place] [address]    Culture   Final    NO GROWTH 3 DAYS Performed at [facility], [place] [address], [place], [place] [address]    Report Status PENDING  Incomplete  MRSA Next Gen by PCR, Nasal     Status: None   Collection Time: 10/28/22  5:57 PM   Specimen: Nasal Mucosa; Nasal Swab  Result Value Ref Range Status   MRSA by PCR Next Gen NOT DETECTED NOT DETECTED Final    Comment: (NOTE) The GeneXpert MRSA Assay (FDA approved for NASAL specimens only), is one component of a comprehensive MRSA colonization surveillance program. It is not intended to diagnose MRSA infection nor to guide or monitor treatment for MRSA infections. Test performance is not FDA approved in patients less than 75 years old. Performed at [facility], [place] [address] [place],  [address]          Radiology Studies: No results found.      Scheduled Meds:  atorvastatin  20 mg Oral Daily   docusate sodium  100 mg Oral BID   furosemide  80 mg Oral Daily   insulin aspart  0-9 Units Subcutaneous TID WC   multivitamin  1 tablet Oral QHS   pantoprazole  40 mg Oral Daily   tamsulosin  0.4 mg Oral Daily   Continuous Infusions:  ceFEPime (MAXIPIME) IV 2 g (10/31/22 0257)   heparin 2,200 Units/hr (10/30/22 2143)   metronidazole 500 mg (10/31/22 0131)   vancomycin 2,000 mg (10/31/22 0405)     LOS: 4 days   Time spent= 35 mins    Feliberto Stockley Arsenio Loader, MD [facility]  If 7PM-7AM, please contact night-coverage  10/31/2022, 4:35 AM

## 2022-11-01 DIAGNOSIS — L03116 Cellulitis of left lower limb: Secondary | ICD-10-CM | POA: Diagnosis not present

## 2022-11-01 LAB — CBC
HCT: 27.8 % — ABNORMAL LOW (ref 39.0–52.0)
Hemoglobin: 8.9 g/dL — ABNORMAL LOW (ref 13.0–17.0)
MCH: 34.6 pg — ABNORMAL HIGH (ref 26.0–34.0)
MCHC: 32 g/dL (ref 30.0–36.0)
MCV: 108.2 fL — ABNORMAL HIGH (ref 80.0–100.0)
Platelets: 158 10*3/uL (ref 150–400)
RBC: 2.57 MIL/uL — ABNORMAL LOW (ref 4.22–5.81)
RDW: 18.2 % — ABNORMAL HIGH (ref 11.5–15.5)
WBC: 4.4 10*3/uL (ref 4.0–10.5)
nRBC: 0 % (ref 0.0–0.2)

## 2022-11-01 LAB — CULTURE, BLOOD (ROUTINE X 2)
Culture: NO GROWTH
Culture: NO GROWTH
Special Requests: ADEQUATE
Special Requests: ADEQUATE

## 2022-11-01 LAB — BASIC METABOLIC PANEL
Anion gap: 11 (ref 5–15)
BUN: 24 mg/dL — ABNORMAL HIGH (ref 8–23)
CO2: 21 mmol/L — ABNORMAL LOW (ref 22–32)
Calcium: 8.4 mg/dL — ABNORMAL LOW (ref 8.9–10.3)
Chloride: 101 mmol/L (ref 98–111)
Creatinine, Ser: 1.15 mg/dL (ref 0.61–1.24)
GFR, Estimated: >60 mL/min (ref >60)
Glucose, Bld: 241 mg/dL — ABNORMAL HIGH (ref 70–99)
Potassium: 4 mmol/L (ref 3.5–5.1)
Sodium: 133 mmol/L — ABNORMAL LOW (ref 135–145)

## 2022-11-01 LAB — GLUCOSE, CAPILLARY
Glucose-Capillary: 161 mg/dL — ABNORMAL HIGH (ref 70–99)
Glucose-Capillary: 253 mg/dL — ABNORMAL HIGH (ref 70–99)
Glucose-Capillary: 229 mg/dL — ABNORMAL HIGH (ref 70–99)
Glucose-Capillary: 314 mg/dL — ABNORMAL HIGH (ref 70–99)

## 2022-11-01 LAB — MAGNESIUM: Magnesium: 1.7 mg/dL (ref 1.7–2.4)

## 2022-11-01 MED ORDER — INSULIN GLARGINE-YFGN 100 UNIT/ML ~~LOC~~ SOLN
15.0000 [IU] | Freq: Every day | SUBCUTANEOUS | Status: DC
  Administered 2022-11-01: 15 [IU] via SUBCUTANEOUS
  Filled 2022-11-01 (×2): qty 0.15

## 2022-11-01 NOTE — Inpatient Diabetes Management (Addendum)
Inpatient Diabetes Program Recommendations  AACE/ADA: New Consensus Statement on Inpatient Glycemic Control (2015)  Target Ranges:  Prepandial:   less than 140 mg/dL      Peak postprandial:   less than 180 mg/dL (1-2 hours)      Critically ill patients:  140 - 180 mg/dL   Lab Results  Component Value Date   GLUCAP 253 (H) 11/01/2022   HGBA1C 5.3 09/09/2022    Review of Glycemic Control  Latest Reference Range & Units 10/31/22 08:40 10/31/22 12:12 10/31/22 16:58 10/31/22 20:26 11/01/22 08:56 11/01/22 11:10  Glucose-Capillary 70 - 99 mg/dL 194 (H) 197 (H) 235 (H) 235 (H) 161 (H) 253 (H)  (H): Data is abnormally high  Diabetes history: DM2 Outpatient Diabetes medications: Januvia 100 mg QD, Metformin 1000 mg BID Current orders for Inpatient glycemic control: Semglee 15 units QD, Novolog 0-9 units TID  Inpatient Diabetes Program Recommendations:    Please consider:  Novolog 2-3 units TID with meals if consumes at least 50%. Add carb modified to diet   Will continue to follow while inpatient.  Thank you, Reche Dixon, MSN, [place] Diabetes Coordinator Inpatient Diabetes Program [phone] (team pager from 8a-5p)

## 2022-11-01 NOTE — Progress Notes (Signed)
PROGRESS NOTE    [name]  FYB:[id number] DOB: [birth year] DOA: 10/27/2022 PCP: Sandi Mariscal, MD   Brief Narrative:  71 y.o. male with PMH significant for obesity, DM2, HTN, HLD, A-fib on Eliquis, colon cancer, chronic anemia BPH, chronic bilateral lower extremity lymphedema with venous stasis ulcers chronically on Lasix, who is a resident at [facility] facility.  Initially sent to ED on 11/15 for left foot erythema, swelling and drainage to [facility] long hospital.  X-ray of the foot showed concerns of possible osteomyelitis, MRI left foot showed chronic osteomyelitis with bony abscess involving fifth metatarsal and pathologic avulsion fracture.  There is also evidence of diffuse cellulitis and myositis.  Patient was started on IV antibiotics ahd IV Heparin. Per Dr Sharol Given, plans for OR on Wednesday.    Assessment & Plan:  Principal Problem:   Cellulitis of left foot Active Problems:   Paroxysmal atrial fibrillation (HCC)   HLD (hyperlipidemia)   DM2 (diabetes mellitus, type 2) (HCC)   BPH (benign prostatic hyperplasia)   Anemia of chronic disease   Moderate protein-calorie malnutrition (HCC)   Osteomyelitis of left foot (HCC)   Left foot cellulitis and myositis Chronic osteomyelitis/abscess of fifth metatarsal with associated fracture Septic arthritis of the fifth metatarsal cuboid joint Presented with worsening left foot wound symptoms in the setting of diabetes mellitus and chronic lymphedema. X-ray and MRI findings as above showing evidence of cellulitis, myositis, osteomyelitis and septic arthritis. Cultures have remained negative, Abx= Vanc/Cefepime/Flagyl.  -Dr Sharol Given recommended to tx him to [facility], plans for OR on Wednesday.   Chronic bilateral lower extremity lymphedema Chronic venous stasis ulcers Lasix 80 po daily. Most recent echo from September 2023 with preserved EF Wound care consult appreciated.  Continue local wound care   CKD 2 Creatinine at baseline.  Continue to  monitor  Paroxysmal A-fib  Chronically on Eliqui, IV Lopressor prn.  On Lovenox 1g/q12hrs. Pharmacy will plan to stop it 24hrs before and after the Sx.    Type 2 diabetes mellitus, uncontrolled due to hyperglycemia.  A1c 5.3 on 09/09/2022.  Home medication metformin and Januvia on hold.  Continue sliding scale and Accu-Cheks. Increase Semglee to 15U daily. PTA on metformin 1000 mg twice daily, Januvia 100 mg daily.  Currently on hold.  Hyperlipidemia -Lipitor   History of colon cancer Status post colon resection in the past.   Chronic macrocytic anemia  Chronic vitamin B12 deficiency Baseline hemoglobin close to 8.  No active bleeding at this time.  Continue monitor hemoglobin. Continue vitamin N27, folic acid supplementation Continue Protonix 40 mg daily.  BPH Continue Flomax 0.4 mg daily   Chronic constipation Bowel regimen with the scheduled Senokot, as needed MiraLAX   ?? Rheumatoid arthritis Home medication on hold  DVT prophylaxis: Currently on heparin drip Code Status: Full code Family Communication:    Status is: Inpatient Remains inpatient appropriate because: Continuing hospital stay for IV antibiotics, surgery tentatively planned for Wednesday.   Nutritional status    Signs/Symptoms: estimated needs  Interventions: MVI, Liberalize Diet  Body mass index is 35.63 kg/m.         Subjective: Seen and examined at bedside.  Does not have any complaints  Examination:  Constitutional: Not in acute distress Respiratory: Clear to auscultation bilaterally Cardiovascular: Normal sinus rhythm, no rubs Abdomen: Nontender nondistended good bowel sounds Musculoskeletal: No edema noted Skin: No rashes seen Neurologic: CN 2-12 grossly intact.  And nonfocal Psychiatric: Normal judgment and insight. Alert and oriented x 3. Normal mood.  Bilateral lower extremity chronic skin changes with some ulceration.  Has some erythema and slightly warm of his left lower  extremity.  Chronic lymphatic insufficiency noted as well.  Objective: Vitals:   10/31/22 1657 10/31/22 1957 11/01/22 0358 11/01/22 0830  BP: 109/66 (!) 109/54 (!) 121/58 132/71  Pulse: 65   66  Resp: '17 18 17 17  '$ Temp: 98.8 F (37.1 C) 98.5 F (36.9 C) 98 F (36.7 C) 97.8 F (36.6 C)  TempSrc: Oral Oral Oral Oral  SpO2: 98% 100% 98% 97%  Weight:      Height:        Intake/Output Summary (Last 24 hours) at 11/01/2022 0843 Last data filed at 11/01/2022 0503 Gross per 24 hour  Intake 960 ml  Output 2300 ml  Net -1340 ml   Filed Weights   10/27/22 1330 10/29/22 0500 10/30/22 0026  Weight: (!) 138 kg 131.3 kg 129.3 kg     Data Reviewed:   CBC: Recent Labs  Lab 10/27/22 1338 10/28/22 0533 10/29/22 0537 10/30/22 0254 10/30/22 1203 10/31/22 0934 11/01/22 0423  WBC 4.9 3.6* 3.7* 4.4 4.3 3.9* 4.4  NEUTROABS 4.0 2.7  --   --   --   --   --   HGB 10.1* 8.4* 8.5* 8.7* 9.4* 8.8* 8.9*  HCT 31.0* 26.2* 27.6* 26.0* 29.0* 26.5* 27.8*  MCV 105.4* 106.5* 110.4* 104.4* 105.8* 105.6* 108.2*  PLT 218 165 147* 159 174 154 378   Basic Metabolic Panel: Recent Labs  Lab 10/27/22 1338 10/28/22 0533 10/29/22 0537 10/30/22 0254 10/31/22 0934 11/01/22 0423  NA 136 136 134* 138 134* 133*  K 3.7 3.5 3.7 4.0 3.8 4.0  CL 97* 102 103 103 99 101  CO2 '29 26 24 23 23 '$ 21*  GLUCOSE 117* 187* 133* 181* 185* 241*  BUN '20 19 20 20 18 '$ 24*  CREATININE 1.27* 1.28* 1.27* 1.26* 1.11 1.15  CALCIUM 8.7* 8.3* 8.2* 8.6* 8.4* 8.4*  MG 1.8 1.7  --   --  1.8 1.7   GFR: Estimated Creatinine Clearance: 85.3 mL/min (by C-G formula based on SCr of 1.15 mg/dL). Liver Function Tests: Recent Labs  Lab 10/27/22 1338 10/28/22 0533  AST 15 13*  ALT 10 10  ALKPHOS 81 69  BILITOT 0.8 0.5  PROT 8.6* 7.1  ALBUMIN 3.6 2.8*   No results for input(s): "LIPASE", "AMYLASE" in the last 168 hours. No results for input(s): "AMMONIA" in the last 168 hours. Coagulation Profile: Recent Labs  Lab  10/28/22 0533  INR 1.3*   Cardiac Enzymes: No results for input(s): "CKTOTAL", "CKMB", "CKMBINDEX", "TROPONINI" in the last 168 hours. BNP (last 3 results) No results for input(s): "PROBNP" in the last 8760 hours. HbA1C: No results for input(s): "HGBA1C" in the last 72 hours. CBG: Recent Labs  Lab 10/30/22 1921 10/31/22 0840 10/31/22 1212 10/31/22 1658 10/31/22 2026  GLUCAP 236* 194* 197* 235* 235*   Lipid Profile: No results for input(s): "CHOL", "HDL", "LDLCALC", "TRIG", "CHOLHDL", "LDLDIRECT" in the last 72 hours. Thyroid Function Tests: No results for input(s): "TSH", "T4TOTAL", "FREET4", "T3FREE", "THYROIDAB" in the last 72 hours. Anemia Panel: No results for input(s): "VITAMINB12", "FOLATE", "FERRITIN", "TIBC", "IRON", "RETICCTPCT" in the last 72 hours. Sepsis Labs: Recent Labs  Lab 10/27/22 1510  LATICACIDVEN 1.0    Recent Results (from the past 240 hour(s))  Blood Cultures x 2 sites     Status: None   Collection Time: 10/27/22  1:38 PM   Specimen: BLOOD  Result Value Ref Range  Status   Specimen Description   Final    BLOOD LEFT ANTECUBITAL Performed at [facility] [address], [place], [place] [address]    Special Requests   Final    BOTTLES DRAWN AEROBIC AND ANAEROBIC Blood Culture adequate volume Performed at [facility] [address] [place], [place] [address]    Culture   Final    NO GROWTH 5 DAYS Performed at [facility], [place] [address], [place], [place] [address]    Report Status 11/01/2022 FINAL  Final  Blood Cultures x 2 sites     Status: None   Collection Time: 10/27/22  7:50 PM   Specimen: Left Antecubital; Blood  Result Value Ref Range Status   Specimen Description   Final    LEFT ANTECUBITAL BLOOD Performed at [facility], [place] [address], [place], [place] [address]    Special Requests   Final    BOTTLES DRAWN AEROBIC AND ANAEROBIC Blood Culture adequate volume Performed at  [facility] [address], [place], [place] [address]    Culture   Final    NO GROWTH 5 DAYS Performed at [facility], [place] [address] [place], [place] [address]    Report Status 11/01/2022 FINAL  Final  MRSA Next Gen by PCR, Nasal     Status: None   Collection Time: 10/28/22  5:57 PM   Specimen: Nasal Mucosa; Nasal Swab  Result Value Ref Range Status   MRSA by PCR Next Gen NOT DETECTED NOT DETECTED Final    Comment: (NOTE) The GeneXpert MRSA Assay (FDA approved for NASAL specimens only), is one component of a comprehensive MRSA colonization surveillance program. It is not intended to diagnose MRSA infection nor to guide or monitor treatment for MRSA infections. Test performance is not FDA approved in patients less than 14 years old. Performed at [facility], [place] [address] [place],  [address]          Radiology Studies: No results found.      Scheduled Meds:  atorvastatin  20 mg Oral Daily   docusate sodium  100 mg Oral BID   enoxaparin (LOVENOX) injection  130 mg Subcutaneous Q12H   furosemide  80 mg Oral Daily   insulin aspart  0-9 Units Subcutaneous TID WC   insulin glargine-yfgn  10 Units Subcutaneous Daily   multivitamin  1 tablet Oral QHS   pantoprazole  40 mg Oral Daily   tamsulosin  0.4 mg Oral Daily   Continuous Infusions:  ceFEPime (MAXIPIME) IV 2 g (11/01/22 0512)   metronidazole 500 mg (11/01/22 0228)   vancomycin 2,000 mg (11/01/22 0558)     LOS: 5 days   Time spent= 35 mins    Johnnye Sandford Arsenio Loader, MD [facility]  If 7PM-7AM, please contact night-coverage  11/01/2022, 8:43 AM

## 2022-11-01 NOTE — Care Management Important Message (Signed)
Important Message  Patient Details  Name: [name] MRN: [id number] Date of Birth: [birth year]   Medicare Important Message Given:  Yes     Hannah Beat 11/01/2022, 3:09 PM

## 2022-11-02 DIAGNOSIS — L03116 Cellulitis of left lower limb: Secondary | ICD-10-CM | POA: Diagnosis not present

## 2022-11-02 LAB — GLUCOSE, CAPILLARY
Glucose-Capillary: 208 mg/dL — ABNORMAL HIGH (ref 70–99)
Glucose-Capillary: 146 mg/dL — ABNORMAL HIGH (ref 70–99)
Glucose-Capillary: 270 mg/dL — ABNORMAL HIGH (ref 70–99)
Glucose-Capillary: 224 mg/dL — ABNORMAL HIGH (ref 70–99)

## 2022-11-02 LAB — CBC
HCT: 27.5 % — ABNORMAL LOW (ref 39.0–52.0)
Hemoglobin: 8.8 g/dL — ABNORMAL LOW (ref 13.0–17.0)
MCH: 34.1 pg — ABNORMAL HIGH (ref 26.0–34.0)
MCHC: 32 g/dL (ref 30.0–36.0)
MCV: 106.6 fL — ABNORMAL HIGH (ref 80.0–100.0)
Platelets: 154 10*3/uL (ref 150–400)
RBC: 2.58 MIL/uL — ABNORMAL LOW (ref 4.22–5.81)
RDW: 18.3 % — ABNORMAL HIGH (ref 11.5–15.5)
WBC: 4.3 10*3/uL (ref 4.0–10.5)
nRBC: 0 % (ref 0.0–0.2)

## 2022-11-02 LAB — BASIC METABOLIC PANEL
Anion gap: 11 (ref 5–15)
BUN: 22 mg/dL (ref 8–23)
CO2: 24 mmol/L (ref 22–32)
Calcium: 8.9 mg/dL (ref 8.9–10.3)
Chloride: 98 mmol/L (ref 98–111)
Creatinine, Ser: 1.13 mg/dL (ref 0.61–1.24)
GFR, Estimated: >60 mL/min (ref >60)
Glucose, Bld: 281 mg/dL — ABNORMAL HIGH (ref 70–99)
Potassium: 4.1 mmol/L (ref 3.5–5.1)
Sodium: 133 mmol/L — ABNORMAL LOW (ref 135–145)

## 2022-11-02 LAB — MAGNESIUM: Magnesium: 1.8 mg/dL (ref 1.7–2.4)

## 2022-11-02 MED ORDER — INSULIN GLARGINE-YFGN 100 UNIT/ML ~~LOC~~ SOLN
22.0000 [IU] | Freq: Every day | SUBCUTANEOUS | Status: DC
  Administered 2022-11-02: 22 [IU] via SUBCUTANEOUS
  Filled 2022-11-02 (×2): qty 0.22

## 2022-11-02 NOTE — Progress Notes (Signed)
PROGRESS NOTE    [name]  NGE:[id number] DOB: [birth year] DOA: 10/27/2022 PCP: Sandi Mariscal, MD   Brief Narrative:  71 y.o. male with PMH significant for obesity, DM2, HTN, HLD, A-fib on Eliquis, colon cancer, chronic anemia BPH, chronic bilateral lower extremity lymphedema with venous stasis ulcers chronically on Lasix, who is a resident at [facility] facility.  Initially sent to ED on 11/15 for left foot erythema, swelling and drainage to [facility] long hospital.  X-ray of the foot showed concerns of possible osteomyelitis, MRI left foot showed chronic osteomyelitis with bony abscess involving fifth metatarsal and pathologic avulsion fracture.  There is also evidence of diffuse cellulitis and myositis. Per Dr Sharol Given, plans for OR on Wednesday.  Currently on Lovenox which will be stopped perioperatively.   Assessment & Plan:  Principal Problem:   Cellulitis of left foot Active Problems:   Paroxysmal atrial fibrillation (HCC)   HLD (hyperlipidemia)   DM2 (diabetes mellitus, type 2) (HCC)   BPH (benign prostatic hyperplasia)   Anemia of chronic disease   Moderate protein-calorie malnutrition (HCC)   Osteomyelitis of left foot (HCC)   Left foot cellulitis and myositis Chronic osteomyelitis/abscess of fifth metatarsal with associated fracture Septic arthritis of the fifth metatarsal cuboid joint Presented with worsening left foot wound symptoms in the setting of diabetes mellitus and chronic lymphedema. X-ray and MRI findings as above showing evidence of cellulitis, myositis, osteomyelitis and septic arthritis. Cultures have remained negative, Abx= Vanc/Cefepime/Flagyl.  -Dr Sharol Given recommended to tx him to [facility], tentative plans for OR tomorrow  Chronic bilateral lower extremity lymphedema Chronic venous stasis ulcers Lasix 80 po daily. Most recent echo from September 2023 with preserved EF Wound care consult appreciated.  Continue local wound care   CKD 2 Creatinine at baseline.   Continue to monitor  Paroxysmal A-fib  Chronically on Eliqui, IV Lopressor prn.  On Lovenox 1g/q12hrs. pharmacy will plan to have this 24 hours before and 24 hours after the surgery.   Type 2 diabetes mellitus, uncontrolled due to hyperglycemia.  A1c 5.3 on 09/09/2022.  Home medication metformin and Januvia on hold.  Continue sliding scale and Accu-Cheks. Increase Semglee to 22U daily. PTA on metformin 1000 mg twice daily, Januvia 100 mg daily.  Currently on hold.  Hyperlipidemia -Lipitor   History of colon cancer Status post colon resection in the past.   Chronic macrocytic anemia  Chronic vitamin B12 deficiency Baseline hemoglobin close to 8.  No active bleeding at this time.  Continue monitor hemoglobin. Continue vitamin M01, folic acid supplementation Continue Protonix 40 mg daily.  BPH Continue Flomax 0.4 mg daily   Chronic constipation Bowel regimen with the scheduled Senokot, as needed MiraLAX   ?? Rheumatoid arthritis Home medication on hold  DVT prophylaxis: Currently on heparin drip Code Status: Full code Family Communication:    Status is: Inpatient Remains inpatient appropriate because: Continuing hospital stay for IV antibiotics, surgery tentatively planned for Wednesday.   Nutritional status    Signs/Symptoms: estimated needs  Interventions: MVI, Liberalize Diet  Body mass index is 36.18 kg/m.         Subjective: No complaints Doing ok  Examination:  Constitutional: Not in acute distress Respiratory: Clear to auscultation bilaterally Cardiovascular: Normal sinus rhythm, no rubs Abdomen: Nontender nondistended good bowel sounds Musculoskeletal: No edema noted Skin: No rashes seen Neurologic: CN 2-12 grossly intact.  And nonfocal Psychiatric: Normal judgment and insight. Alert and oriented x 3. Normal mood.    Bilateral lower extremity chronic skin  changes with some ulceration.  Has some erythema and slightly warm of his left lower  extremity.  Chronic lymphatic insufficiency noted as well.  Objective: Vitals:   11/01/22 1726 11/01/22 2110 11/02/22 0400 11/02/22 0500  BP: (!) 111/58 118/61 (!) 101/59   Pulse: 71 76 (!) 59   Resp: '19 18 17   '$ Temp: 98.5 F (36.9 C) 98.6 F (37 C) 97.7 F (36.5 C)   TempSrc: Oral Oral Oral   SpO2: 99% 99% 99%   Weight:    131.3 kg  Height:        Intake/Output Summary (Last 24 hours) at 11/02/2022 0844 Last data filed at 11/02/2022 0843 Gross per 24 hour  Intake --  Output 2300 ml  Net -2300 ml   Filed Weights   10/29/22 0500 10/30/22 0026 11/02/22 0500  Weight: 131.3 kg 129.3 kg 131.3 kg     Data Reviewed:   CBC: Recent Labs  Lab 10/27/22 1338 10/28/22 0533 10/29/22 0537 10/30/22 0254 10/30/22 1203 10/31/22 0934 11/01/22 0423 11/02/22 0309  WBC 4.9 3.6*   < > 4.4 4.3 3.9* 4.4 4.3  NEUTROABS 4.0 2.7  --   --   --   --   --   --   HGB 10.1* 8.4*   < > 8.7* 9.4* 8.8* 8.9* 8.8*  HCT 31.0* 26.2*   < > 26.0* 29.0* 26.5* 27.8* 27.5*  MCV 105.4* 106.5*   < > 104.4* 105.8* 105.6* 108.2* 106.6*  PLT 218 165   < > 159 174 154 158 154   < > = values in this interval not displayed.   Basic Metabolic Panel: Recent Labs  Lab 10/27/22 1338 10/28/22 0533 10/29/22 0537 10/30/22 0254 10/31/22 0934 11/01/22 0423 11/02/22 0309  NA 136 136 134* 138 134* 133* 133*  K 3.7 3.5 3.7 4.0 3.8 4.0 4.1  CL 97* 102 103 103 99 101 98  CO2 '29 26 24 23 23 '$ 21* 24  GLUCOSE 117* 187* 133* 181* 185* 241* 281*  BUN '20 19 20 20 18 '$ 24* 22  CREATININE 1.27* 1.28* 1.27* 1.26* 1.11 1.15 1.13  CALCIUM 8.7* 8.3* 8.2* 8.6* 8.4* 8.4* 8.9  MG 1.8 1.7  --   --  1.8 1.7 1.8   GFR: Estimated Creatinine Clearance: 87.5 mL/min (by C-G formula based on SCr of 1.13 mg/dL). Liver Function Tests: Recent Labs  Lab 10/27/22 1338 10/28/22 0533  AST 15 13*  ALT 10 10  ALKPHOS 81 69  BILITOT 0.8 0.5  PROT 8.6* 7.1  ALBUMIN 3.6 2.8*   No results for input(s): "LIPASE", "AMYLASE" in the last  168 hours. No results for input(s): "AMMONIA" in the last 168 hours. Coagulation Profile: Recent Labs  Lab 10/28/22 0533  INR 1.3*   Cardiac Enzymes: No results for input(s): "CKTOTAL", "CKMB", "CKMBINDEX", "TROPONINI" in the last 168 hours. BNP (last 3 results) No results for input(s): "PROBNP" in the last 8760 hours. HbA1C: No results for input(s): "HGBA1C" in the last 72 hours. CBG: Recent Labs  Lab 11/01/22 0856 11/01/22 1110 11/01/22 1723 11/01/22 2119 11/02/22 0812  GLUCAP 161* 253* 229* 314* 208*   Lipid Profile: No results for input(s): "CHOL", "HDL", "LDLCALC", "TRIG", "CHOLHDL", "LDLDIRECT" in the last 72 hours. Thyroid Function Tests: No results for input(s): "TSH", "T4TOTAL", "FREET4", "T3FREE", "THYROIDAB" in the last 72 hours. Anemia Panel: No results for input(s): "VITAMINB12", "FOLATE", "FERRITIN", "TIBC", "IRON", "RETICCTPCT" in the last 72 hours. Sepsis Labs: Recent Labs  Lab 10/27/22 1510  LATICACIDVEN 1.0  Recent Results (from the past 240 hour(s))  Blood Cultures x 2 sites     Status: None   Collection Time: 10/27/22  1:38 PM   Specimen: BLOOD  Result Value Ref Range Status   Specimen Description   Final    BLOOD LEFT ANTECUBITAL Performed at [facility] [address], [place], [place] [address]    Special Requests   Final    BOTTLES DRAWN AEROBIC AND ANAEROBIC Blood Culture adequate volume Performed at [facility] [address], [place], [place] [address]    Culture   Final    NO GROWTH 5 DAYS Performed at [facility], [place] [address], [place], [place] [address]    Report Status 11/01/2022 FINAL  Final  Blood Cultures x 2 sites     Status: None   Collection Time: 10/27/22  7:50 PM   Specimen: Left Antecubital; Blood  Result Value Ref Range Status   Specimen Description   Final    LEFT ANTECUBITAL BLOOD Performed at [facility], [place] [address], [place], [place] [address]     Special Requests   Final    BOTTLES DRAWN AEROBIC AND ANAEROBIC Blood Culture adequate volume Performed at [facility] [address], [place], [place] [address]    Culture   Final    NO GROWTH 5 DAYS Performed at [facility], [place] [address] [place], [place] [address]    Report Status 11/01/2022 FINAL  Final  MRSA Next Gen by PCR, Nasal     Status: None   Collection Time: 10/28/22  5:57 PM   Specimen: Nasal Mucosa; Nasal Swab  Result Value Ref Range Status   MRSA by PCR Next Gen NOT DETECTED NOT DETECTED Final    Comment: (NOTE) The GeneXpert MRSA Assay (FDA approved for NASAL specimens only), is one component of a comprehensive MRSA colonization surveillance program. It is not intended to diagnose MRSA infection nor to guide or monitor treatment for MRSA infections. Test performance is not FDA approved in patients less than 14 years old. Performed at [facility], [place] [address], [place],  [address]          Radiology Studies: No results found.      Scheduled Meds:  atorvastatin  20 mg Oral Daily   docusate sodium  100 mg Oral BID   furosemide  80 mg Oral Daily   insulin aspart  0-9 Units Subcutaneous TID WC   insulin glargine-yfgn  15 Units Subcutaneous Daily   multivitamin  1 tablet Oral QHS   pantoprazole  40 mg Oral Daily   tamsulosin  0.4 mg Oral Daily   Continuous Infusions:  ceFEPime (MAXIPIME) IV 2 g (11/02/22 0315)   metronidazole 500 mg (11/02/22 0126)   vancomycin 2,000 mg (11/02/22 0505)     LOS: 6 days   Time spent= 35 mins    Finley Dinkel Arsenio Loader, MD [facility]  If 7PM-7AM, please contact night-coverage  11/02/2022, 8:44 AM

## 2022-11-03 ENCOUNTER — Encounter (HOSPITAL_COMMUNITY)
Admission: EM | Disposition: A | Discharge status: Skilled Nursing Facility | Payer: Self-pay | Source: Home / Self Care | Attending: Internal Medicine

## 2022-11-03 ENCOUNTER — Inpatient Hospital Stay (HOSPITAL_COMMUNITY): Payer: Medicare HMO | Admitting: Certified Registered Nurse Anesthetist

## 2022-11-03 ENCOUNTER — Encounter (HOSPITAL_COMMUNITY): Payer: Self-pay | Admitting: Internal Medicine

## 2022-11-03 DIAGNOSIS — M869 Osteomyelitis, unspecified: Secondary | ICD-10-CM | POA: Diagnosis not present

## 2022-11-03 DIAGNOSIS — I509 Heart failure, unspecified: Secondary | ICD-10-CM

## 2022-11-03 DIAGNOSIS — I110 Hypertensive heart disease with heart failure: Secondary | ICD-10-CM

## 2022-11-03 DIAGNOSIS — S88112A Complete traumatic amputation at level between knee and ankle, left lower leg, initial encounter: Secondary | ICD-10-CM | POA: Diagnosis not present

## 2022-11-03 DIAGNOSIS — Z87891 Personal history of nicotine dependence: Secondary | ICD-10-CM

## 2022-11-03 DIAGNOSIS — L03116 Cellulitis of left lower limb: Secondary | ICD-10-CM | POA: Diagnosis not present

## 2022-11-03 HISTORY — PX: APPLICATION OF WOUND VAC: SHX5189

## 2022-11-03 HISTORY — PX: AMPUTATION: SHX166

## 2022-11-03 LAB — CBC
HCT: 26.9 % — ABNORMAL LOW (ref 39.0–52.0)
Hemoglobin: 9 g/dL — ABNORMAL LOW (ref 13.0–17.0)
MCH: 35.2 pg — ABNORMAL HIGH (ref 26.0–34.0)
MCHC: 33.5 g/dL (ref 30.0–36.0)
MCV: 105.1 fL — ABNORMAL HIGH (ref 80.0–100.0)
Platelets: 163 10*3/uL (ref 150–400)
RBC: 2.56 MIL/uL — ABNORMAL LOW (ref 4.22–5.81)
RDW: 18.3 % — ABNORMAL HIGH (ref 11.5–15.5)
WBC: 5 10*3/uL (ref 4.0–10.5)
nRBC: 0 % (ref 0.0–0.2)

## 2022-11-03 LAB — GLUCOSE, CAPILLARY
Glucose-Capillary: 157 mg/dL — ABNORMAL HIGH (ref 70–99)
Glucose-Capillary: 135 mg/dL — ABNORMAL HIGH (ref 70–99)
Glucose-Capillary: 126 mg/dL — ABNORMAL HIGH (ref 70–99)
Glucose-Capillary: 132 mg/dL — ABNORMAL HIGH (ref 70–99)
Glucose-Capillary: 115 mg/dL — ABNORMAL HIGH (ref 70–99)
Glucose-Capillary: 276 mg/dL — ABNORMAL HIGH (ref 70–99)

## 2022-11-03 LAB — BASIC METABOLIC PANEL
Anion gap: 8 (ref 5–15)
BUN: 23 mg/dL (ref 8–23)
CO2: 24 mmol/L (ref 22–32)
Calcium: 8.6 mg/dL — ABNORMAL LOW (ref 8.9–10.3)
Chloride: 103 mmol/L (ref 98–111)
Creatinine, Ser: 1.15 mg/dL (ref 0.61–1.24)
GFR, Estimated: >60 mL/min (ref >60)
Glucose, Bld: 165 mg/dL — ABNORMAL HIGH (ref 70–99)
Potassium: 4.2 mmol/L (ref 3.5–5.1)
Sodium: 135 mmol/L (ref 135–145)

## 2022-11-03 LAB — MAGNESIUM: Magnesium: 1.8 mg/dL (ref 1.7–2.4)

## 2022-11-03 SURGERY — AMPUTATION BELOW KNEE
Anesthesia: Monitor Anesthesia Care | Site: Leg Lower | Laterality: Left

## 2022-11-03 MED ORDER — LABETALOL HCL 5 MG/ML IV SOLN: 10.0000 mg | INTRAVENOUS | Status: DC | PRN

## 2022-11-03 MED ORDER — POLYETHYLENE GLYCOL 3350 17 G PO PACK: 17.0000 g | PACK | Freq: Every day | ORAL | Status: DC | PRN

## 2022-11-03 MED ORDER — ZINC SULFATE 220 (50 ZN) MG PO CAPS
220.0000 mg | ORAL_CAPSULE | Freq: Every day | ORAL | Status: DC
  Administered 2022-11-04 – 2022-11-16 (×13): 220 mg via ORAL
  Filled 2022-11-03 (×13): qty 1

## 2022-11-03 MED ORDER — ACETAMINOPHEN 10 MG/ML IV SOLN
1000.0000 mg | Freq: Once | INTRAVENOUS | Status: DC | PRN
  Administered 2022-11-03: 1000 mg via INTRAVENOUS

## 2022-11-03 MED ORDER — GUAIFENESIN-DM 100-10 MG/5ML PO SYRP
15.0000 mL | ORAL_SOLUTION | ORAL | Status: DC | PRN
  Administered 2022-11-04 – 2022-11-06 (×2): 15 mL via ORAL
  Filled 2022-11-03 (×2): qty 20

## 2022-11-03 MED ORDER — MAGNESIUM SULFATE 2 GM/50ML IV SOLN: 2.0000 g | Freq: Every day | INTRAVENOUS | Status: DC | PRN

## 2022-11-03 MED ORDER — PHENOL 1.4 % MT LIQD: 1.0000 | OROMUCOSAL | Status: DC | PRN

## 2022-11-03 MED ORDER — PROPOFOL 10 MG/ML IV BOLUS
INTRAVENOUS | Status: DC | PRN
  Administered 2022-11-03: 50 mg via INTRAVENOUS

## 2022-11-03 MED ORDER — DOCUSATE SODIUM 100 MG PO CAPS
100.0000 mg | ORAL_CAPSULE | Freq: Every day | ORAL | Status: DC
  Administered 2022-11-04 – 2022-11-16 (×13): 100 mg via ORAL
  Filled 2022-11-03 (×13): qty 1

## 2022-11-03 MED ORDER — FENTANYL CITRATE (PF) 100 MCG/2ML IJ SOLN
INTRAMUSCULAR | Status: AC
  Filled 2022-11-03: qty 2

## 2022-11-03 MED ORDER — MAGNESIUM CITRATE PO SOLN
1.0000 | Freq: Once | ORAL | Status: AC | PRN
  Administered 2022-11-16: 1 via ORAL
  Filled 2022-11-03 (×2): qty 296

## 2022-11-03 MED ORDER — PROPOFOL 500 MG/50ML IV EMUL
INTRAVENOUS | Status: DC | PRN
  Administered 2022-11-03: 80 ug/kg/min via INTRAVENOUS

## 2022-11-03 MED ORDER — JUVEN PO PACK: 1.0000 | PACK | Freq: Two times a day (BID) | ORAL | Status: DC

## 2022-11-03 MED ORDER — HYDROMORPHONE HCL 1 MG/ML IJ SOLN
0.5000 mg | INTRAMUSCULAR | Status: DC | PRN
  Administered 2022-11-03 – 2022-11-04 (×3): 1 mg via INTRAVENOUS
  Filled 2022-11-03 (×4): qty 1

## 2022-11-03 MED ORDER — VITAMIN C 500 MG PO TABS
1000.0000 mg | ORAL_TABLET | Freq: Every day | ORAL | Status: DC
  Administered 2022-11-04 – 2022-11-16 (×13): 1000 mg via ORAL
  Filled 2022-11-03 (×13): qty 2

## 2022-11-03 MED ORDER — ROPIVACAINE HCL 5 MG/ML IJ SOLN
INTRAMUSCULAR | Status: DC | PRN
  Administered 2022-11-03: 40 mL via PERINEURAL

## 2022-11-03 MED ORDER — HYDRALAZINE HCL 20 MG/ML IJ SOLN: 5.0000 mg | INTRAMUSCULAR | Status: DC | PRN

## 2022-11-03 MED ORDER — METOPROLOL TARTRATE 5 MG/5ML IV SOLN: 2.0000 mg | INTRAVENOUS | Status: DC | PRN

## 2022-11-03 MED ORDER — FENTANYL CITRATE (PF) 100 MCG/2ML IJ SOLN: 100.0000 ug | Freq: Once | INTRAMUSCULAR | Status: AC

## 2022-11-03 MED ORDER — POTASSIUM CHLORIDE CRYS ER 20 MEQ PO TBCR: 20.0000 meq | EXTENDED_RELEASE_TABLET | Freq: Every day | ORAL | Status: DC | PRN

## 2022-11-03 MED ORDER — PANTOPRAZOLE SODIUM 40 MG PO TBEC: 40.0000 mg | DELAYED_RELEASE_TABLET | Freq: Every day | ORAL | Status: DC

## 2022-11-03 MED ORDER — MIDAZOLAM HCL 2 MG/2ML IJ SOLN
INTRAMUSCULAR | Status: AC
  Administered 2022-11-03: 2 mg via INTRAVENOUS
  Filled 2022-11-03: qty 2

## 2022-11-03 MED ORDER — ORAL CARE MOUTH RINSE: 15.0000 mL | Freq: Once | OROMUCOSAL | Status: AC

## 2022-11-03 MED ORDER — ACETAMINOPHEN 10 MG/ML IV SOLN
INTRAVENOUS | Status: AC
  Filled 2022-11-03: qty 100

## 2022-11-03 MED ORDER — SODIUM CHLORIDE 0.9 % IV SOLN: INTRAVENOUS | Status: DC

## 2022-11-03 MED ORDER — CHLORHEXIDINE GLUCONATE 0.12 % MT SOLN
OROMUCOSAL | Status: AC
  Administered 2022-11-03: 15 mL via OROMUCOSAL
  Filled 2022-11-03: qty 15

## 2022-11-03 MED ORDER — FENTANYL CITRATE (PF) 100 MCG/2ML IJ SOLN
INTRAMUSCULAR | Status: AC
  Administered 2022-11-03: 100 ug via INTRAVENOUS
  Filled 2022-11-03: qty 2

## 2022-11-03 MED ORDER — CHLORHEXIDINE GLUCONATE 0.12 % MT SOLN: 15.0000 mL | Freq: Once | OROMUCOSAL | Status: AC

## 2022-11-03 MED ORDER — FENTANYL CITRATE (PF) 250 MCG/5ML IJ SOLN
INTRAMUSCULAR | Status: AC
  Filled 2022-11-03: qty 5

## 2022-11-03 MED ORDER — 0.9 % SODIUM CHLORIDE (POUR BTL) OPTIME
TOPICAL | Status: DC | PRN
  Administered 2022-11-03: 1000 mL

## 2022-11-03 MED ORDER — MIDAZOLAM HCL 2 MG/2ML IJ SOLN: 2.0000 mg | Freq: Once | INTRAMUSCULAR | Status: AC

## 2022-11-03 MED ORDER — INSULIN ASPART 100 UNIT/ML IJ SOLN: 0.0000 [IU] | INTRAMUSCULAR | Status: DC | PRN

## 2022-11-03 MED ORDER — CEFAZOLIN SODIUM-DEXTROSE 2-4 GM/100ML-% IV SOLN: 2.0000 g | Freq: Three times a day (TID) | INTRAVENOUS | Status: DC

## 2022-11-03 MED ORDER — ONDANSETRON HCL 4 MG/2ML IJ SOLN: 4.0000 mg | Freq: Four times a day (QID) | INTRAMUSCULAR | Status: DC | PRN

## 2022-11-03 MED ORDER — LACTATED RINGERS IV SOLN: INTRAVENOUS | Status: DC

## 2022-11-03 MED ORDER — JUVEN PO PACK
1.0000 | PACK | Freq: Two times a day (BID) | ORAL | Status: DC
  Administered 2022-11-04 – 2022-11-16 (×25): 1 via ORAL
  Filled 2022-11-03 (×24): qty 1

## 2022-11-03 MED ORDER — ACETAMINOPHEN 325 MG PO TABS
325.0000 mg | ORAL_TABLET | Freq: Four times a day (QID) | ORAL | Status: DC | PRN
  Administered 2022-11-04 – 2022-11-08 (×6): 650 mg via ORAL
  Administered 2022-11-09: 500 mg via ORAL
  Filled 2022-11-03 (×7): qty 2

## 2022-11-03 MED ORDER — INSULIN GLARGINE-YFGN 100 UNIT/ML ~~LOC~~ SOLN
14.0000 [IU] | Freq: Every day | SUBCUTANEOUS | Status: DC
  Administered 2022-11-03 – 2022-11-16 (×14): 14 [IU] via SUBCUTANEOUS
  Filled 2022-11-03 (×15): qty 0.14

## 2022-11-03 MED ORDER — BISACODYL 5 MG PO TBEC
5.0000 mg | DELAYED_RELEASE_TABLET | Freq: Every day | ORAL | Status: DC | PRN
  Administered 2022-11-13: 5 mg via ORAL
  Filled 2022-11-03: qty 1

## 2022-11-03 MED ORDER — PROSOURCE PLUS PO LIQD
30.0000 mL | Freq: Three times a day (TID) | ORAL | Status: DC
  Administered 2022-11-04 – 2022-11-16 (×37): 30 mL via ORAL
  Filled 2022-11-03 (×35): qty 30

## 2022-11-03 MED ORDER — OXYCODONE HCL 5 MG PO TABS
5.0000 mg | ORAL_TABLET | ORAL | Status: DC | PRN
  Administered 2022-11-10: 10 mg via ORAL
  Filled 2022-11-03: qty 1
  Filled 2022-11-03: qty 2

## 2022-11-03 MED ORDER — OXYCODONE HCL 5 MG PO TABS
10.0000 mg | ORAL_TABLET | ORAL | Status: DC | PRN
  Administered 2022-11-04 – 2022-11-16 (×54): 15 mg via ORAL
  Filled 2022-11-03 (×13): qty 3
  Filled 2022-11-03: qty 2
  Filled 2022-11-03 (×41): qty 3

## 2022-11-03 MED ORDER — FENTANYL CITRATE (PF) 100 MCG/2ML IJ SOLN
25.0000 ug | INTRAMUSCULAR | Status: DC | PRN
  Administered 2022-11-03: 25 ug via INTRAVENOUS
  Administered 2022-11-03: 50 ug via INTRAVENOUS
  Administered 2022-11-03: 25 ug via INTRAVENOUS

## 2022-11-03 MED ORDER — ALUM & MAG HYDROXIDE-SIMETH 200-200-20 MG/5ML PO SUSP: 15.0000 mL | ORAL | Status: DC | PRN

## 2022-11-03 SURGICAL SUPPLY — 40 items
BAG COUNTER SPONGE SURGICOUNT (BAG) IMPLANT
BLADE SAW RECIP 87.9 MT (BLADE) ×2 IMPLANT
BLADE SURG 21 STRL SS (BLADE) ×2 IMPLANT
BNDG COHESIVE 6X5 TAN STRL LF (GAUZE/BANDAGES/DRESSINGS) IMPLANT
CANISTER WOUND CARE 500ML ATS (WOUND CARE) ×2 IMPLANT
CANISTER WOUNDNEG PRESSURE 500 (CANNISTER) IMPLANT
COVER SURGICAL LIGHT HANDLE (MISCELLANEOUS) ×2 IMPLANT
CUFF TOURN SGL QUICK 34 (TOURNIQUET CUFF) ×2
CUFF TRNQT CYL 34X4.125X (TOURNIQUET CUFF) ×2 IMPLANT
DRAPE DERMATAC (DRAPES) IMPLANT
DRAPE INCISE IOBAN 66X45 STRL (DRAPES) ×2 IMPLANT
DRAPE U-SHAPE 47X51 STRL (DRAPES) ×2 IMPLANT
DRESSING PREVENA PLUS CUSTOM (GAUZE/BANDAGES/DRESSINGS) ×2 IMPLANT
DRSG PREVENA PLUS CUSTOM (GAUZE/BANDAGES/DRESSINGS) ×2
DURAPREP 26ML APPLICATOR (WOUND CARE) ×2 IMPLANT
ELECT REM PT RETURN 9FT ADLT (ELECTROSURGICAL) ×2
ELECTRODE REM PT RTRN 9FT ADLT (ELECTROSURGICAL) ×2 IMPLANT
GLOVE BIOGEL PI IND STRL 9 (GLOVE) ×2 IMPLANT
GLOVE SURG ORTHO 9.0 STRL STRW (GLOVE) ×2 IMPLANT
GOWN STRL REUS W/ TWL XL LVL3 (GOWN DISPOSABLE) ×4 IMPLANT
GOWN STRL REUS W/TWL XL LVL3 (GOWN DISPOSABLE) ×4
GRAFT SKIN WND MICRO 38 (Tissue) IMPLANT
KIT BASIN OR (CUSTOM PROCEDURE TRAY) ×2 IMPLANT
KIT TURNOVER KIT B (KITS) ×2 IMPLANT
MANIFOLD NEPTUNE II (INSTRUMENTS) ×2 IMPLANT
NS IRRIG 1000ML POUR BTL (IV SOLUTION) ×2 IMPLANT
PACK ORTHO EXTREMITY (CUSTOM PROCEDURE TRAY) ×2 IMPLANT
PAD ARMBOARD 7.5X6 YLW CONV (MISCELLANEOUS) ×2 IMPLANT
PREVENA RESTOR ARTHOFORM 33X30 (CANNISTER) IMPLANT
PREVENA RESTOR ARTHOFORM 46X30 (CANNISTER) ×2 IMPLANT
SPONGE T-LAP 18X18 ~~LOC~~+RFID (SPONGE) IMPLANT
STAPLER VISISTAT 35W (STAPLE) IMPLANT
STOCKINETTE IMPERVIOUS LG (DRAPES) ×2 IMPLANT
SUT ETHILON 2 0 PSLX (SUTURE) IMPLANT
SUT SILK 2 0 (SUTURE) ×2
SUT SILK 2-0 18XBRD TIE 12 (SUTURE) ×2 IMPLANT
SUT VIC AB 1 CTX 27 (SUTURE) ×4 IMPLANT
TOWEL GREEN STERILE (TOWEL DISPOSABLE) ×2 IMPLANT
TUBE CONNECTING 12X1/4 (SUCTIONS) ×2 IMPLANT
YANKAUER SUCT BULB TIP NO VENT (SUCTIONS) ×2 IMPLANT

## 2022-11-03 NOTE — TOC Progression Note (Signed)
Transition of Care [facility]) - Progression Note    Patient Details  Name: CHRISHUN SCHEER MRN: [id number] Date of Birth: Sep 19, 1951  [facility] CM/SW Contact  Pollie Friar, RN Phone Number: 11/03/2022, 3:52 PM  Clinical Narrative:    Plan is for OR today. TOC following for d/c needs.   Expected Discharge Plan: Assisted Living Barriers to Discharge: Continued Medical Work up  Expected Discharge Plan and Services Expected Discharge Plan: Assisted Living In-house Referral: NA Discharge Planning Services: CM Consult Post Acute Care Choice: Resumption of Svcs/PTA Provider, Nursing Home Living arrangements for the past 2 months: [facility]                 DME Arranged: N/A DME Agency: NA                   Social Determinants of Health (SDOH) Interventions Transportation Interventions: Intervention Not Indicated  Readmission Risk Interventions    10/28/2022   12:37 PM 10/28/2022    8:36 AM  Readmission Risk Prevention Plan  Transportation Screening Complete Complete  PCP or Specialist Appt within 5-7 Days Complete Complete  Home Care Screening Complete Complete  Medication Review (RN CM) Complete Complete

## 2022-11-03 NOTE — Anesthesia Preprocedure Evaluation (Signed)
Anesthesia Evaluation  Patient identified by MRN, date of birth, ID band Patient awake    Reviewed: Allergy & Precautions, NPO status , Patient's Chart, lab work & pertinent test results  Airway Mallampati: II  TM Distance: >3 FB Neck ROM: Full    Dental  (+) Edentulous Upper, Edentulous Lower   Pulmonary neg pulmonary ROS, former smoker   Pulmonary exam normal        Cardiovascular hypertension, +CHF  + dysrhythmias Atrial Fibrillation  Rhythm:Regular Rate:Normal     Neuro/Psych negative neurological ROS  negative psych ROS   GI/Hepatic Neg liver ROS, PUD,GERD  Medicated,,  Endo/Other  diabetes, Type 2, Oral Hypoglycemic Agents    Renal/GU   negative genitourinary   Musculoskeletal  (+) Arthritis ,    Abdominal Normal abdominal exam  (+)   Peds  Hematology  (+) Blood dyscrasia, anemia   Anesthesia Other Findings   Reproductive/Obstetrics                             Anesthesia Physical Anesthesia Plan  ASA: 3  Anesthesia Plan: MAC and Regional   Post-op Pain Management:    Induction: Intravenous  PONV Risk Score and Plan: 1 and Ondansetron, Dexamethasone, Propofol infusion and Treatment may vary due to age or medical condition  Airway Management Planned: Simple Face Mask, Natural Airway and Nasal Cannula  Additional Equipment: None  Intra-op Plan:   Post-operative Plan:   Informed Consent: I have reviewed the patients History and Physical, chart, labs and discussed the procedure including the risks, benefits and alternatives for the proposed anesthesia with the patient or authorized representative who has indicated his/her understanding and acceptance.     Dental advisory given  Plan Discussed with: CRNA  Anesthesia Plan Comments:        Anesthesia Quick Evaluation

## 2022-11-03 NOTE — Plan of Care (Signed)
  Problem: Coping: Goal: Ability to adjust to condition or change in health will improve Outcome: Progressing   Problem: Skin Integrity: Goal: Risk for impaired skin integrity will decrease Outcome: Progressing   Problem: Pain Managment: Goal: General experience of comfort will improve Outcome: Progressing

## 2022-11-03 NOTE — Op Note (Signed)
11/03/2022  8:09 PM  PATIENT:  [name]    PRE-OPERATIVE DIAGNOSIS:  L BKA  POST-OPERATIVE DIAGNOSIS:  Same  PROCEDURE:  LEFT AMPUTATION BELOW KNEE, APPLICATION OF WOUND VAC Application of Kerecis micro graft 38 cm and Kerecis sheet 7 x 10 cm. Application of Prevena customizable and Prevena arthroform wound VAC dressings Application of Vive Wear stump shrinker and the Hanger limb protector  SURGEON:  Newt Minion, MD  ANESTHESIA:   General  PREOPERATIVE INDICATIONS:  [name] is a  71 y.o. male with a diagnosis of L BKA who failed conservative measures and elected for surgical management.    The risks benefits and alternatives were discussed with the patient preoperatively including but not limited to the risks of infection, bleeding, nerve injury, cardiopulmonary complications, the need for revision surgery, among others, and the patient was willing to proceed.  OPERATIVE IMPLANTS: Kerecis micro graft 38 cm    OPERATIVE FINDINGS: Muscle with good petechial bleeding.  OPERATIVE PROCEDURE: Patient was brought to the operating room after undergoing a regional anesthetic.  After adequate levels anesthesia were obtained a thigh tourniquet was placed and the lower extremity was prepped using DuraPrep draped into a sterile field. The foot was draped out of the sterile field with impervious stockinette.  A timeout was called and the tourniquet inflated.  A transverse skin incision was made 12 cm distal to the tibial tubercle, the incision curved proximally, and a large posterior flap was created.  The tibia was transected just proximal to the skin incision and beveled anteriorly.  The fibula was transected just proximal to the tibial incision.  The sciatic nerve was pulled cut and allowed to retract.  The vascular bundles were suture ligated with 2-0 silk.  The tourniquet was deflated and hemostasis obtained.    Drill holes were placed through the tibia and fibula to secure  the [facility] tissue graft and the gastrocnemius fascia.    The Kerecis micro powder 38 cm was applied to the open wound that has a 200 cm surface area.     The deep and superficial fascial layers were closed using #1 Vicryl.  The skin was closed using staples.    The Prevena customizable dressing was applied this was overwrapped with the arthroform sponge.  Charlie Pitter was used to secure the sponges and the circumferential compression was secured to the skin with Dermatac.  This was connected to the wound VAC pump and had a good suction fit this was covered with a stump shrinker and a limb protector.  Patient was taken to the PACU in stable condition.   DISCHARGE PLANNING:  Antibiotic duration: 24-hour antibiotics  Weightbearing: Nonweightbearing on the operative extremity  Pain medication: Opioid pathway  Dressing care/ Wound VAC: Continue wound VAC with the Prevena plus pump at discharge for 1 week  Ambulatory devices: Walker or kneeling scooter  Discharge to: Discharge planning based on recommendations per physical therapy  Follow-up: In the office 1 week after discharge.

## 2022-11-03 NOTE — Interval H&P Note (Signed)
History and Physical Interval Note:  11/03/2022 5:43 PM  Delanna Notice  has presented today for surgery, with the diagnosis of L BKA.  The various methods of treatment have been discussed with the patient and family. After consideration of risks, benefits and other options for treatment, the patient has consented to  Procedure(s): LEFT AMPUTATION BELOW KNEE (Left) as a surgical intervention.  The patient's history has been reviewed, patient examined, no change in status, stable for surgery.  I have reviewed the patient's chart and labs.  Questions were answered to the patient's satisfaction.     Newt Minion

## 2022-11-03 NOTE — Care Management Important Message (Signed)
Important Message  Patient Details  Name: [name] MRN: [id number] Date of Birth: Apr 15, 1951   Medicare Important Message Given:  Yes     Hannah Beat 11/03/2022, 1:46 PM

## 2022-11-03 NOTE — Progress Notes (Signed)
Nutrition Follow-up  DOCUMENTATION CODES:   Obesity unspecified  INTERVENTION:  Once diet resumes, recommend: Carb modified diet 30 ml ProSource Plus TID, each supplement provides 100 kcals and 15 grams protein.  1 packet Juven BID, each packet provides 95 calories, 2.5 grams of protein (collagen), and 9.8 grams of carbohydrate (3 grams sugar); also contains 7 grams of L-arginine and L-glutamine, 300 mg vitamin C, 15 mg vitamin E, 1.2 mcg vitamin B-12, 9.5 mg zinc, 200 mg calcium, and 1.5 g  Calcium Beta-hydroxy-Beta-methylbutyrate to support wound healing  NUTRITION DIAGNOSIS:   Increased nutrient needs related to wound healing as evidenced by estimated needs.  ongoing  GOAL:   Patient will meet greater than or equal to 90% of their needs  Progressing, addressing via meals and supplements   MONITOR:   PO intake  REASON FOR ASSESSMENT:   Malnutrition Screening Tool    ASSESSMENT:   Pt is a 71yo M with PMH of a-fib, DM, HLD, HTN, anemia, adenocarcinoma of colon, chronic constipation, and BPH who presents with cellulitis of L foot.  Pt off unit x2 attempts to follow up. Ortho plans for L transtibial amputation today.   Meal completions:  11/18: 100% breakfast 11/19: 100% x2 recorded meals   He would benefit from the addition of nutrition supplements to support wound healing. Will add post-operatively.   Pt's weight throughout admission appears to have remained stable.   Medications: colace, lasix, SSI 0-9 units TID, semglee 14 units daily, rena-vit, protonix, IV abx  Labs reviewed CBG's 135-270 x24 hours  Diet Order:   Diet Order             Diet NPO time specified  Diet effective now                   EDUCATION NEEDS:   Not appropriate for education at this time  Skin:  Skin Assessment: Skin Integrity Issues: Skin Integrity Issues:: Incisions Incisions: L foot  Last BM:  11/20  Height:   Ht Readings from Last 1 Encounters:  10/30/22 '6\' 3"'$   (1.905 m)    Weight:   Wt Readings from Last 1 Encounters:  11/02/22 131.3 kg    Ideal Body Weight:  89.1 kg  BMI:  Body mass index is 36.18 kg/m.  Estimated Nutritional Needs:   Kcal:  2000-2400kcal  Protein:  125-140g  Fluid:  >2L  Clayborne Dana, RDN, LDN Clinical Nutrition

## 2022-11-03 NOTE — Progress Notes (Signed)
Pharmacy Antibiotic Note  [name] is a 71 y.o. male admitted on 10/27/2022 with L foot Cellulitis, chronic osteo with bony abscess involving fifth metatarsal and pathologic avulsion fracture seen on MRI 11/15. Plan transtibial amputation 11/22. Pharmacy has been consulted for vancomycin and cefepime dosing.  Plan: Continue vancomycin '2000mg'$  IV q24 hours (eAUC 484, Scr 1.15, Vd 0.5) Continue cefepime 2g IV q8 hours Continue metronidazole '500mg'$  IV q12 hours per MD F/u antibiotic plan post-op If vancomycin continues post-op, will need to check vancomycin levels  Height: '6\' 3"'$  (190.5 cm) Weight: 131.3 kg (289 lb 7.4 oz) (bed weight) IBW/kg (Calculated) : 84.5  Temp (24hrs), Avg:98 F (36.7 C), Min:97.8 F (36.6 C), Max:98.2 F (36.8 C)  Recent Labs  Lab 10/27/22 1510 10/28/22 0533 10/30/22 0254 10/30/22 1203 10/31/22 0934 11/01/22 0423 11/02/22 0309 11/03/22 0506  WBC  --    < > 4.4 4.3 3.9* 4.4 4.3 5.0  CREATININE  --    < > 1.26*  --  1.11 1.15 1.13 1.15  LATICACIDVEN 1.0  --   --   --   --   --   --   --    < > = values in this interval not displayed.    Estimated Creatinine Clearance: 86 mL/min (by C-G formula based on SCr of 1.15 mg/dL).    Allergies  Allergen Reactions   Other Itching and Other (See Comments)    Wynetta [employer] bandage (self-gripping) - erythema, itching    Antimicrobials this admission: Vancomycin 11/15 >>  Cefepime 11/15 >>  Metronidazole 11/15 >>   Dose adjustments this admission:  Microbiology results: 11/15 BCx: ngtd (final) 11/16 MRSA PCR: not detected  Thank you for allowing pharmacy to be a part of this patient's care.  Dimple Nanas, PharmD, BCPS 11/03/2022 9:16 AM

## 2022-11-03 NOTE — Plan of Care (Signed)
  Problem: Coping: Goal: Ability to adjust to condition or change in health will improve Outcome: Progressing   Problem: Fluid Volume: Goal: Ability to maintain a balanced intake and output will improve Outcome: Progressing   Problem: Metabolic: Goal: Ability to maintain appropriate glucose levels will improve Outcome: Progressing

## 2022-11-03 NOTE — Anesthesia Procedure Notes (Signed)
Anesthesia Regional Block: Popliteal block   Pre-Anesthetic Checklist: , timeout performed,  Correct Patient, Correct Site, Correct Laterality,  Correct Procedure, Correct Position, site marked,  Risks and benefits discussed,  Surgical consent,  Pre-op evaluation,  At surgeon's request and post-op pain management  Laterality: Left  Prep: Dura Prep       Needles:  Injection technique: Single-shot  Needle Type: Echogenic Stimulator Needle     Needle Length: 10cm  Needle Gauge: 20     Additional Needles:   Procedures:,,,, ultrasound used (permanent image in chart),,    Narrative:  Start time: 11/03/2022 6:48 PM End time: 11/03/2022 6:50 PM Injection made incrementally with aspirations every 5 mL.  Performed by: Personally  Anesthesiologist: Darral Dash, DO  Additional Notes: Patient identified. Risks/Benefits/Options discussed with patient including but not limited to bleeding, infection, nerve damage, failed block, incomplete pain control. Patient expressed understanding and wished to proceed. All questions were answered. Sterile technique was used throughout the entire procedure. Please see nursing notes for vital signs. Aspirated in 5cc intervals with injection for negative confirmation. Patient was given instructions on fall risk and not to get out of bed. All questions and concerns addressed with instructions to call with any issues or inadequate analgesia.

## 2022-11-03 NOTE — Plan of Care (Signed)

## 2022-11-03 NOTE — Anesthesia Procedure Notes (Signed)
Anesthesia Regional Block: Adductor canal block   Pre-Anesthetic Checklist: , timeout performed,  Correct Patient, Correct Site, Correct Laterality,  Correct Procedure, Correct Position, site marked,  Risks and benefits discussed,  Surgical consent,  Pre-op evaluation,  At surgeon's request and post-op pain management  Laterality: Left  Prep: Dura Prep       Needles:  Injection technique: Single-shot  Needle Type: Echogenic Stimulator Needle     Needle Length: 10cm  Needle Gauge: 20     Additional Needles:   Procedures:,,,, ultrasound used (permanent image in chart),,    Narrative:  Start time: 11/03/2022 6:44 PM End time: 11/03/2022 6:46 PM Injection made incrementally with aspirations every 5 mL.  Performed by: Personally  Anesthesiologist: Darral Dash, DO  Additional Notes: Patient identified. Risks/Benefits/Options discussed with patient including but not limited to bleeding, infection, nerve damage, failed block, incomplete pain control. Patient expressed understanding and wished to proceed. All questions were answered. Sterile technique was used throughout the entire procedure. Please see nursing notes for vital signs. Aspirated in 5cc intervals with injection for negative confirmation. Patient was given instructions on fall risk and not to get out of bed. All questions and concerns addressed with instructions to call with any issues or inadequate analgesia.

## 2022-11-03 NOTE — Transfer of Care (Signed)
Immediate Anesthesia Transfer of Care Note  Patient: [name]  Procedure(s) Performed: LEFT AMPUTATION BELOW KNEE (Left: Knee) APPLICATION OF WOUND VAC (Left: Leg Lower)  Patient Location: PACU  Anesthesia Type:MAC combined with regional for post-op pain  Level of Consciousness: awake, alert , and oriented  Airway & Oxygen Therapy: Patient Spontanous Breathing  Post-op Assessment: Report given to RN and Post -op Vital signs reviewed and stable  Post vital signs: Reviewed and stable  Last Vitals:  Vitals Value Taken Time  BP 114/56 11/03/22 2001  Temp    Pulse 61 11/03/22 2004  Resp 16 11/03/22 2004  SpO2 95 % 11/03/22 2004  Vitals shown include unvalidated device data.  Last Pain:  Vitals:   11/03/22 1820  TempSrc: Oral  PainSc: 0-No pain      Patients Stated Pain Goal: 0 (91/91/66 0600)  Complications: No notable events documented.

## 2022-11-03 NOTE — Progress Notes (Signed)
PROGRESS NOTE    [name]  BOF:[id number] DOB: 09-Jan-1951 DOA: 10/27/2022 PCP: Sandi Mariscal, MD   Brief Narrative:  71 y.o. male with PMH significant for obesity, DM2, HTN, HLD, A-fib on Eliquis, colon cancer, chronic anemia BPH, chronic bilateral lower extremity lymphedema with venous stasis ulcers chronically on Lasix, who is a resident at [facility] facility.  Initially sent to ED on 11/15 for left foot erythema, swelling and drainage to [facility] long hospital.  X-ray of the foot showed concerns of possible osteomyelitis, MRI left foot showed chronic osteomyelitis with bony abscess involving fifth metatarsal and pathologic avulsion fracture.  There is also evidence of diffuse cellulitis and myositis. Per Dr Sharol Given, plans for OR on Wednesday.  Currently on Lovenox which will be stopped perioperatively.   Assessment & Plan:  Principal Problem:   Cellulitis of left foot Active Problems:   Paroxysmal atrial fibrillation (HCC)   HLD (hyperlipidemia)   DM2 (diabetes mellitus, type 2) (HCC)   BPH (benign prostatic hyperplasia)   Anemia of chronic disease   Moderate protein-calorie malnutrition (HCC)   Osteomyelitis of left foot (HCC)   Left foot cellulitis and myositis Chronic osteomyelitis/abscess of fifth metatarsal with associated fracture Septic arthritis of the fifth metatarsal cuboid joint Presented with worsening left foot wound symptoms in the setting of diabetes mellitus and chronic lymphedema. X-ray and MRI findings as above showing evidence of cellulitis, myositis, osteomyelitis and septic arthritis. Cultures have remained negative, Abx= Vanc/Cefepime/Flagyl.  -Dr Sharol Given recommended to tx him to [facility], plans for OR today  Chronic bilateral lower extremity lymphedema Chronic venous stasis ulcers Lasix 80 mg p.o. daily Most recent echo from September 2023 with preserved EF Wound care consult appreciated.  Continue local wound care   CKD 2 Creatinine at baseline.  Continue to  monitor  Paroxysmal A-fib  Chronically on Eliqui, IV Lopressor prn.  On Lovenox 1g/q12hrs. pharmacy will plan to have this 24 hours before and 24 hours after the surgery.   Type 2 diabetes mellitus, uncontrolled due to hyperglycemia.  A1c 5.3 on 09/09/2022.  Home medication metformin and Januvia on hold.  Continue sliding scale and Accu-Cheks.reduce Semglee to 14 units daily while NPO PTA on metformin 1000 mg twice daily, Januvia 100 mg daily.  Currently on hold.  Hyperlipidemia -Lipitor   History of colon cancer Status post colon resection in the past.   Chronic macrocytic anemia  Chronic vitamin B12 deficiency Baseline hemoglobin close to 8.  No active bleeding at this time.  Continue monitor hemoglobin. Continue vitamin D78, folic acid supplementation Continue Protonix 40 mg daily.  BPH Continue Flomax 0.4 mg daily   Chronic constipation Bowel regimen with the scheduled Senokot, as needed MiraLAX   ?? Rheumatoid arthritis Home medication on hold  DVT prophylaxis: On Lovenox 1 mg/kg.  Holding perioperatively Code Status: Full code Family Communication:    Status is: Inpatient Remains inpatient appropriate because:Plans for OR today - Amputation per ortho   Nutritional status    Signs/Symptoms: estimated needs  Interventions: MVI, Liberalize Diet  Body mass index is 36.18 kg/m.         Subjective: Seen and examined at bedside.  No complaints Examination:  Constitutional: Not in acute distress Respiratory: Clear to auscultation bilaterally Cardiovascular: Normal sinus rhythm, no rubs Abdomen: Nontender nondistended good bowel sounds Musculoskeletal: No edema noted Skin: No rashes seen Neurologic: CN 2-12 grossly intact.  And nonfocal Psychiatric: Normal judgment and insight. Alert and oriented x 3. Normal mood.   Bilateral lower  extremity chronic skin changes with some ulceration.  Has some erythema and slightly warm of his left lower extremity.   Chronic lymphatic insufficiency noted as well.  Objective: Vitals:   11/02/22 0500 11/02/22 1419 11/02/22 2029 11/03/22 0729  BP:  105/60 132/63 (!) 110/56  Pulse:  66 61 60  Resp:  17 (!) 22   Temp:  97.8 F (36.6 C) 98.2 F (36.8 C) 98 F (36.7 C)  TempSrc:  Oral Oral Oral  SpO2:  100% 98%   Weight: 131.3 kg     Height:        Intake/Output Summary (Last 24 hours) at 11/03/2022 0733 Last data filed at 11/03/2022 1027 Gross per 24 hour  Intake 700 ml  Output 3200 ml  Net -2500 ml   Filed Weights   10/29/22 0500 10/30/22 0026 11/02/22 0500  Weight: 131.3 kg 129.3 kg 131.3 kg     Data Reviewed:   CBC: Recent Labs  Lab 10/27/22 1338 10/28/22 0533 10/29/22 0537 10/30/22 1203 10/31/22 0934 11/01/22 0423 11/02/22 0309 11/03/22 0506  WBC 4.9 3.6*   < > 4.3 3.9* 4.4 4.3 5.0  NEUTROABS 4.0 2.7  --   --   --   --   --   --   HGB 10.1* 8.4*   < > 9.4* 8.8* 8.9* 8.8* 9.0*  HCT 31.0* 26.2*   < > 29.0* 26.5* 27.8* 27.5* 26.9*  MCV 105.4* 106.5*   < > 105.8* 105.6* 108.2* 106.6* 105.1*  PLT 218 165   < > 174 154 158 154 163   < > = values in this interval not displayed.   Basic Metabolic Panel: Recent Labs  Lab 10/28/22 0533 10/29/22 0537 10/30/22 0254 10/31/22 0934 11/01/22 0423 11/02/22 0309 11/03/22 0506  NA 136   < > 138 134* 133* 133* 135  K 3.5   < > 4.0 3.8 4.0 4.1 4.2  CL 102   < > 103 99 101 98 103  CO2 26   < > 23 23 21* 24 24  GLUCOSE 187*   < > 181* 185* 241* 281* 165*  BUN 19   < > 20 18 24* 22 23  CREATININE 1.28*   < > 1.26* 1.11 1.15 1.13 1.15  CALCIUM 8.3*   < > 8.6* 8.4* 8.4* 8.9 8.6*  MG 1.7  --   --  1.8 1.7 1.8 1.8   < > = values in this interval not displayed.   GFR: Estimated Creatinine Clearance: 86 mL/min (by C-G formula based on SCr of 1.15 mg/dL). Liver Function Tests: Recent Labs  Lab 10/27/22 1338 10/28/22 0533  AST 15 13*  ALT 10 10  ALKPHOS 81 69  BILITOT 0.8 0.5  PROT 8.6* 7.1  ALBUMIN 3.6 2.8*   No results for  input(s): "LIPASE", "AMYLASE" in the last 168 hours. No results for input(s): "AMMONIA" in the last 168 hours. Coagulation Profile: Recent Labs  Lab 10/28/22 0533  INR 1.3*   Cardiac Enzymes: No results for input(s): "CKTOTAL", "CKMB", "CKMBINDEX", "TROPONINI" in the last 168 hours. BNP (last 3 results) No results for input(s): "PROBNP" in the last 8760 hours. HbA1C: No results for input(s): "HGBA1C" in the last 72 hours. CBG: Recent Labs  Lab 11/02/22 0812 11/02/22 1127 11/02/22 1610 11/02/22 2009 11/03/22 0726  GLUCAP 208* 146* 270* 224* 157*   Lipid Profile: No results for input(s): "CHOL", "HDL", "LDLCALC", "TRIG", "CHOLHDL", "LDLDIRECT" in the last 72 hours. Thyroid Function Tests: No results for input(s): "  TSH", "T4TOTAL", "FREET4", "T3FREE", "THYROIDAB" in the last 72 hours. Anemia Panel: No results for input(s): "VITAMINB12", "FOLATE", "FERRITIN", "TIBC", "IRON", "RETICCTPCT" in the last 72 hours. Sepsis Labs: Recent Labs  Lab 10/27/22 1510  LATICACIDVEN 1.0    Recent Results (from the past 240 hour(s))  Blood Cultures x 2 sites     Status: None   Collection Time: 10/27/22  1:38 PM   Specimen: BLOOD  Result Value Ref Range Status   Specimen Description   Final    BLOOD LEFT ANTECUBITAL Performed at [facility] [address], [place], [place] [address]    Special Requests   Final    BOTTLES DRAWN AEROBIC AND ANAEROBIC Blood Culture adequate volume Performed at [facility] [address], [place], [place] [address]    Culture   Final    NO GROWTH 5 DAYS Performed at [facility], [place] [address] [place], [place] [address]    Report Status 11/01/2022 FINAL  Final  Blood Cultures x 2 sites     Status: None   Collection Time: 10/27/22  7:50 PM   Specimen: Left Antecubital; Blood  Result Value Ref Range Status   Specimen Description   Final    LEFT ANTECUBITAL BLOOD Performed at [facility],  [place] [address], [place], [place] [address]    Special Requests   Final    BOTTLES DRAWN AEROBIC AND ANAEROBIC Blood Culture adequate volume Performed at [facility] [address], [place], [place] [address]    Culture   Final    NO GROWTH 5 DAYS Performed at [facility], [place] [address], [place], [place] [address]    Report Status 11/01/2022 FINAL  Final  MRSA Next Gen by PCR, Nasal     Status: None   Collection Time: 10/28/22  5:57 PM   Specimen: Nasal Mucosa; Nasal Swab  Result Value Ref Range Status   MRSA by PCR Next Gen NOT DETECTED NOT DETECTED Final    Comment: (NOTE) The GeneXpert MRSA Assay (FDA approved for NASAL specimens only), is one component of a comprehensive MRSA colonization surveillance program. It is not intended to diagnose MRSA infection nor to guide or monitor treatment for MRSA infections. Test performance is not FDA approved in patients less than 71 years old. Performed at [facility], [place] [address] [place], [place] [address]          Radiology Studies: No results found.      Scheduled Meds:  atorvastatin  20 mg Oral Daily   docusate sodium  100 mg Oral BID   furosemide  80 mg Oral Daily   insulin aspart  0-9 Units Subcutaneous TID WC   insulin glargine-yfgn  22 Units Subcutaneous Daily   multivitamin  1 tablet Oral QHS   pantoprazole  40 mg Oral Daily   tamsulosin  0.4 mg Oral Daily   Continuous Infusions:  ceFEPime (MAXIPIME) IV 2 g (11/03/22 0518)   metronidazole 500 mg (11/03/22 0209)   vancomycin 2,000 mg (11/03/22 0603)     LOS: 7 days   Time spent= 35 mins    Matilde Markie Arsenio Loader, MD [facility]  If 7PM-7AM, please contact night-coverage  11/03/2022, 7:33 AM

## 2022-11-04 ENCOUNTER — Encounter (HOSPITAL_COMMUNITY): Payer: Self-pay | Admitting: Orthopedic Surgery

## 2022-11-04 DIAGNOSIS — L03116 Cellulitis of left lower limb: Secondary | ICD-10-CM | POA: Diagnosis not present

## 2022-11-04 LAB — BASIC METABOLIC PANEL
Anion gap: 12 (ref 5–15)
BUN: 21 mg/dL (ref 8–23)
CO2: 23 mmol/L (ref 22–32)
Calcium: 8.7 mg/dL — ABNORMAL LOW (ref 8.9–10.3)
Chloride: 97 mmol/L — ABNORMAL LOW (ref 98–111)
Creatinine, Ser: 1.14 mg/dL (ref 0.61–1.24)
GFR, Estimated: >60 mL/min (ref >60)
Glucose, Bld: 283 mg/dL — ABNORMAL HIGH (ref 70–99)
Potassium: 4.4 mmol/L (ref 3.5–5.1)
Sodium: 132 mmol/L — ABNORMAL LOW (ref 135–145)

## 2022-11-04 LAB — GLUCOSE, CAPILLARY
Glucose-Capillary: 132 mg/dL — ABNORMAL HIGH (ref 70–99)
Glucose-Capillary: 154 mg/dL — ABNORMAL HIGH (ref 70–99)
Glucose-Capillary: 205 mg/dL — ABNORMAL HIGH (ref 70–99)
Glucose-Capillary: 143 mg/dL — ABNORMAL HIGH (ref 70–99)

## 2022-11-04 LAB — CBC
HCT: 26.4 % — ABNORMAL LOW (ref 39.0–52.0)
Hemoglobin: 8.3 g/dL — ABNORMAL LOW (ref 13.0–17.0)
MCH: 34.2 pg — ABNORMAL HIGH (ref 26.0–34.0)
MCHC: 31.4 g/dL (ref 30.0–36.0)
MCV: 108.6 fL — ABNORMAL HIGH (ref 80.0–100.0)
Platelets: 150 10*3/uL (ref 150–400)
RBC: 2.43 MIL/uL — ABNORMAL LOW (ref 4.22–5.81)
RDW: 18.3 % — ABNORMAL HIGH (ref 11.5–15.5)
WBC: 5.9 10*3/uL (ref 4.0–10.5)
nRBC: 0 % (ref 0.0–0.2)

## 2022-11-04 LAB — MAGNESIUM: Magnesium: 1.9 mg/dL (ref 1.7–2.4)

## 2022-11-04 MED ORDER — APIXABAN 5 MG PO TABS
5.0000 mg | ORAL_TABLET | Freq: Two times a day (BID) | ORAL | Status: DC
  Administered 2022-11-04 – 2022-11-16 (×24): 5 mg via ORAL
  Filled 2022-11-04 (×24): qty 1

## 2022-11-04 NOTE — Anesthesia Postprocedure Evaluation (Signed)
Anesthesia Post Note  Patient: [name]  Procedure(s) Performed: BELOW KNEE  AMPUTATION, LEFT (Left: Knee) APPLICATION OF WOUND VAC (Left: Leg Lower)     Patient location during evaluation: PACU Anesthesia Type: MAC and Regional Level of consciousness: awake and alert Pain management: pain level controlled Vital Signs Assessment: post-procedure vital signs reviewed and stable Respiratory status: spontaneous breathing, nonlabored ventilation, respiratory function stable and patient connected to nasal cannula oxygen Cardiovascular status: stable and blood pressure returned to baseline Postop Assessment: no apparent nausea or vomiting Anesthetic complications: no   No notable events documented.  Last Vitals:  Vitals:   11/03/22 2100 11/03/22 2117  BP: 102/66 120/74  Pulse: (!) 58 (!) 54  Resp: 18 15  Temp: 36.5 C 36.7 C  SpO2: 98% 100%    Last Pain:  Vitals:   11/03/22 2213  TempSrc:   PainSc: 9                  Gleb Mcguire P Samaira Holzworth

## 2022-11-04 NOTE — Plan of Care (Signed)

## 2022-11-04 NOTE — Evaluation (Signed)
Occupational Therapy Evaluation Patient Details Name: [name] MRN: [id number] DOB: [birth year] Today's Date: 11/04/2022   History of Present Illness 71 y.o. male presents to [facility] hospital on 10/27/2022 with osteomyelitis of L foot. 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 PMH includes obesity, DMII, HTN, HLD, afib, colon cancer, chronic BLE lymphedema.   Clinical Impression   Patient admitted for the diagnosis above, and subsequent BKA.  At baseline he resides at a local ALF, sponge baths at baseline, and walked the facility with a 3wrw.  Deficits are listed below.  Currently he is needing up to Mod A for basic mobility, especially sit to stand due to knee and hip issues, and Mod A for ADL seated at the sink.  OT will follow in the acute setting.  The patient has an excellent chance at getting to Mod I level at the wheelchair, and can tolerate 3+ hours of rehab each day.  AIR is recommended to maximize his independence prior to returning home.        Recommendations for follow up therapy are one component of a multi-disciplinary discharge planning process, led by the attending physician.  Recommendations may be updated based on patient status, additional functional criteria and insurance authorization.   Follow Up Recommendations  Acute inpatient rehab (3hours/day)     Assistance Recommended at Discharge Frequent or constant Supervision/Assistance  Patient can return home with the following A lot of help with bathing/dressing/bathroom;Assist for transportation;A lot of help with walking and/or transfers;Help with stairs or ramp for entrance    Functional Status Assessment  Patient has had a recent decline in their functional status and demonstrates the ability to make significant improvements in function in a reasonable and predictable amount of time.  Equipment Recommendations  Wheelchair (measurements OT);Wheelchair cushion (measurements OT)    Recommendations for Other Services Rehab  consult     Precautions / Restrictions Precautions Precautions: Fall Precaution Comments: wound vac Required Braces or Orthoses: Other Brace Other Brace: Limb protector Restrictions Weight Bearing Restrictions: Yes LLE Weight Bearing: Non weight bearing      Mobility Bed Mobility                    Transfers Overall transfer level: Needs assistance   Transfers: Sit to/from Stand, Bed to chair/wheelchair/BSC Sit to Stand: Mod assist, From elevated surface     Step pivot transfers: Min assist, Mod assist            Balance Overall balance assessment: Needs assistance Sitting-balance support: Feet unsupported Sitting balance-Leahy Scale: Good     Standing balance support: Reliant on assistive device for balance Standing balance-Leahy Scale: Poor                             ADL either performed or assessed with clinical judgement   ADL Overall ADL's : Needs assistance/impaired Eating/Feeding: Independent;Sitting   Grooming: Wash/dry hands;Wash/dry face;Set up;Sitting   Upper Body Bathing: Set up;Sitting   Lower Body Bathing: Moderate assistance;Sit to/from stand Lower Body Bathing Details (indicate cue type and reason): sink in fromt and chair behind Upper Body Dressing : Set up;Sitting   Lower Body Dressing: Moderate assistance;Sit to/from stand;Sitting/lateral leans   Toilet Transfer: Moderate assistance;Rolling walker (2 wheels);Ambulation;Minimal assistance;Regular Museum/gallery exhibitions officer and Hygiene: Supervision/safety;Sitting/lateral lean               Vision Baseline Vision/History: 1 Wears glasses Patient Visual Report: No change  from baseline       Perception     Praxis      Pertinent Vitals/Pain Pain Assessment Pain Assessment: Faces Faces Pain Scale: Hurts little more Pain Location: R foot and L residual limb Pain Descriptors / Indicators: Tender Pain Intervention(s): Monitored during session      Hand Dominance Right   Extremity/Trunk Assessment Upper Extremity Assessment Upper Extremity Assessment: Generalized weakness   Lower Extremity Assessment Lower Extremity Assessment: Defer to PT evaluation   Cervical / Trunk Assessment Cervical / Trunk Assessment: Kyphotic   Communication Communication Communication: No difficulties   Cognition Arousal/Alertness: Awake/alert Behavior During Therapy: WFL for tasks assessed/performed Overall Cognitive Status: Within Functional Limits for tasks assessed Area of Impairment: Memory                     Memory: Decreased short-term memory               General Comments   VSS on RA    Exercises     Shoulder Instructions      Home Living Family/patient expects to be discharged to:: Assisted living Living Arrangements: Other (Comment) (roommate) Available Help at Discharge: Available PRN/intermittently Type of Home: Apartment Home Access: Level entry     Home Layout: One level     Bathroom Shower/Tub: Sponge bathes at baseline   Bathroom Toilet: Handicapped height Bathroom Accessibility: Yes How Accessible: Accessible via walker Home Equipment: Other (comment);Grab bars - toilet   Additional Comments: 3wrw      Prior Functioning/Environment Prior Level of Function : Independent/Modified Independent             Mobility Comments: " can walk with my shoes and my rollator or just my shoes but not just my rollator" ADLs Comments: sponge bathes only        OT Problem List: Decreased strength;Decreased range of motion;Decreased activity tolerance;Impaired balance (sitting and/or standing);Decreased knowledge of precautions;Decreased knowledge of use of DME or AE;Decreased safety awareness;Pain      OT Treatment/Interventions: Self-care/ADL training;Therapeutic exercise;Therapeutic activities;DME and/or AE instruction;Patient/family education;Balance training    OT Goals(Current goals can be  found in the care plan section) Acute Rehab OT Goals Patient Stated Goal: Be as independent as possible OT Goal Formulation: With patient Time For Goal Achievement: 11/18/22 Potential to Achieve Goals: Good ADL Goals Pt Will Perform Grooming: with supervision;standing;sitting Pt Will Perform Lower Body Bathing: with supervision;sit to/from stand Pt Will Perform Lower Body Dressing: with supervision;sit to/from stand Pt Will Transfer to Toilet: with supervision;ambulating;regular height toilet Pt/caregiver will Perform Home Exercise Program: Increased strength;Both right and left upper extremity;With theraband;With Supervision;With written HEP provided  OT Frequency: Min 2X/week    Co-evaluation              AM-PAC OT "6 Clicks" Daily Activity     Outcome Measure Help from another person eating meals?: None Help from another person taking care of personal grooming?: None Help from another person toileting, which includes using toliet, bedpan, or urinal?: A Lot Help from another person bathing (including washing, rinsing, drying)?: A Lot Help from another person to put on and taking off regular upper body clothing?: None Help from another person to put on and taking off regular lower body clothing?: A Lot 6 Click Score: 18   End of Session Equipment Utilized During Treatment: Gait belt;Rolling walker (2 wheels) Nurse Communication: Mobility status  Activity Tolerance: Patient tolerated treatment well Patient left: in chair;with call bell/phone within reach;with chair  alarm set  OT Visit Diagnosis: Unsteadiness on feet (R26.81);Pain Pain - Right/Left: Left Pain - part of body: Leg                Time: 7116-5790 OT Time Calculation (min): 26 min Charges:  OT General Charges $OT Visit: 1 Visit OT Evaluation $OT Eval Moderate Complexity: 1 Mod OT Treatments $Self Care/Home Management : 8-22 mins  11/04/2022  RP, OTR/L  Acute Rehabilitation Services  Office:   [phone]   Metta Clines 11/04/2022, 10:42 AM

## 2022-11-04 NOTE — Progress Notes (Signed)
PROGRESS NOTE  [name] ASN:[id number] DOB: 1950/12/18 DOA: 10/27/2022 PCP: Sandi Mariscal, MD   LOS: 8 days   Brief Narrative / Interim history: 71 y.o. male with PMH significant for obesity, DM2, HTN, HLD, A-fib on Eliquis, colon cancer, chronic anemia BPH, chronic bilateral lower extremity lymphedema with venous stasis ulcers chronically on Lasix, who is a resident at [facility] facility.  Initially sent to ED on 11/15 for left foot erythema, swelling and drainage to [facility] long hospital.  X-ray of the foot showed concerns of possible osteomyelitis, MRI left foot showed chronic osteomyelitis with bony abscess involving fifth metatarsal and pathologic avulsion fracture.  There is also evidence of diffuse cellulitis and myositis.  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  Subjective / 24h Interval events: Doing well, complains of pain at the left stump but feels like it is controlled.  Seems to be in good spirits today  Assesement and Plan: Principal Problem:   Cellulitis of left foot Active Problems:   Paroxysmal atrial fibrillation (HCC)   HLD (hyperlipidemia)   DM2 (diabetes mellitus, type 2) (HCC)   BPH (benign prostatic hyperplasia)   Anemia of chronic disease   Moderate protein-calorie malnutrition (HCC)   Osteomyelitis of left foot (HCC)   Principal problem Left foot cellulitis and myositis Chronic osteomyelitis/abscess of fifth metatarsal with associated fracture Septic arthritis of the fifth metatarsal cuboid joint -Presented with worsening left foot wound symptoms in the setting of diabetes mellitus and chronic lymphedema. X-ray and MRI findings as above showing evidence of cellulitis, myositis, osteomyelitis and septic arthritis.  He was placed on broad-spectrum antibiotics and orthopedic surgery was consulted.  He was taken to the OR on 11/22 and he is status post left BKA.  PT evaluation pending  Active problems Chronic bilateral lower extremity lymphedema Chronic  venous stasis ulcers -Continue home Lasix 80 mg p.o. daily. Most recent echo from September 2023 with preserved EF. Wound care consult appreciated.  Continue local wound care   CKD 2 -Creatinine at baseline.  Continue to monitor   Paroxysmal A-fib  -will discuss with Ortho when Eliquis can be resumed   Type 2 diabetes mellitus, uncontrolled due to hyperglycemia -A1c 5.3 on 09/09/2022.  Home medication metformin and Januvia on hold.  Continue sliding scale and Accu-Cheks.  CBG (last 3)  Recent Labs    11/03/22 2006 11/03/22 2313 11/04/22 0825  GLUCAP 115* 276* 132*     Hyperlipidemia -Lipitor   History of colon cancer Status post colon resection in the past.   Chronic macrocytic anemia  Chronic vitamin B12 deficiency -Baseline hemoglobin close to 8.  At baseline, no bleeding   BPH -Continue Flomax 0.4 mg daily   Chronic constipation -Bowel regimen with the scheduled Senokot, as needed MiraLAX   ?? Rheumatoid arthritis- Home medication on hold  Scheduled Meds:  (feeding supplement) PROSource Plus  30 mL Oral TID BM   vitamin C  1,000 mg Oral Daily   atorvastatin  20 mg Oral Daily   docusate sodium  100 mg Oral Daily   furosemide  80 mg Oral Daily   insulin aspart  0-9 Units Subcutaneous TID WC   insulin glargine-yfgn  14 Units Subcutaneous Daily   multivitamin  1 tablet Oral QHS   nutrition supplement (JUVEN)  1 packet Oral BID BM   pantoprazole  40 mg Oral Daily   tamsulosin  0.4 mg Oral Daily   zinc sulfate  220 mg Oral Daily   Continuous Infusions:  sodium chloride     ceFEPime (MAXIPIME) IV 2 g (11/04/22 0421)   magnesium sulfate bolus IVPB     metronidazole 500 mg (11/04/22 0036)   vancomycin 2,000 mg (11/04/22 0524)   PRN Meds:.acetaminophen, alum & mag hydroxide-simeth, bisacodyl, fentaNYL (SUBLIMAZE) injection, guaiFENesin-dextromethorphan, hydrALAZINE, hydrALAZINE, HYDROmorphone (DILAUDID) injection, ipratropium-albuterol, labetalol, magnesium citrate,  magnesium sulfate bolus IVPB, metoprolol tartrate, metoprolol tartrate, naLOXone (NARCAN)  injection, ondansetron (ZOFRAN) IV, oxyCODONE, oxyCODONE, phenol, polyethylene glycol, potassium chloride, traZODone  Current Outpatient Medications  Medication Instructions   apixaban (ELIQUIS) 5 mg, Oral, 2 times daily   atorvastatin (LIPITOR) 20 mg, Oral, Daily   carbamide peroxide (DEBROX) 6.5 % OTIC solution 5 drops, Right EAR, 2 times daily   cyanocobalamin 1,000 mcg, Oral, Daily   dextrose (GLUTOSE) 40 % GEL 1 Tube, Oral, Once PRN, Give Glucose Gel by mouth for BS<70. Repeat CBG in 16 minutes, Repeat Gel & Recheck CBG in 15 minutes. Call MD if Deemed Necessary. Call 911 if Resident is Symptomatic, Not Alert or Unconscious   docusate sodium (COLACE) 100 mg, Oral, 2 times daily   folic acid (FOLVITE) 1 mg, Oral, See admin instructions, Take 1 mg by mouth at 8 AM daily on Sun/Mon/Tues/Wed/Thurs/Sat ONLY   furosemide (LASIX) 80 mg, Oral, Daily   hydrOXYzine (ATARAX) 25 mg, Oral, Every 8 hours PRN, Use caution as this medication may make you drowsy   Januvia 100 mg, Oral, Daily   metFORMIN (GLUCOPHAGE) 1,000 mg, Oral, 2 times daily with meals   methotrexate (RHEUMATREX) 10 mg, Oral, Every Fri   NONFORMULARY OR COMPOUNDED ITEM 1 application , Topical, See admin instructions, Triamcinolone 0.1% cream + Cetaphil cream 1:4 - apply to lower legs once a day   oxyCODONE (OXY IR/ROXICODONE) 5 mg, Oral, Every 6 hours PRN   pantoprazole (PROTONIX) 40 mg, Oral, Daily   polyethylene glycol (MIRALAX) 17 g, Oral, Daily, Hold for diarrhea   potassium chloride SA (KLOR-CON M) 20 MEQ tablet 20 mEq, Oral, Daily   senna-docusate (SENOKOT-S) 8.6-50 MG tablet 1 tablet, Oral, 2 times daily between meals PRN   tamsulosin (FLOMAX) 0.4 mg, Oral, Daily   terbinafine (LAMISIL) 1 % cream Topical, 2 times daily   triamcinolone (KENALOG) 0.1 % 1 application , Topical, See admin instructions, Apply to itchy areas 2 times a day and  and affected areas of the body as needed for dermatitis   zinc oxide 20 % ointment 1 Application, Topical, See admin instructions, Apply to left calf area 2 times a day    Diet Orders (From admission, onward)     Start     Ordered   11/03/22 2145  Diet Carb Modified Fluid consistency: Thin; Room service appropriate? Yes  Diet effective now       Question Answer Comment  Calorie Level Medium 1600-2000   Fluid consistency: Thin   Room service appropriate? Yes      11/03/22 2144            DVT prophylaxis: SCD's Start: 11/03/22 2145 SCDs Start: 10/27/22 1957   Lab Results  Component Value Date   PLT 150 11/04/2022      Code Status: Full Code  Family Communication: no family at bedside   Status is: Inpatient  Remains inpatient appropriate because: PT eval, postop care. May need SNF   Level of care: Med-Surg  Consultants:  Orthopedic surgery   Objective: Vitals:   11/03/22 2100 11/03/22 2117 11/04/22 0424 11/04/22 0828  BP: 102/66 120/74 (!) 120/54 (!) 123/103  Pulse: (!) 58 (!) 54 65 74  Resp: '18 15 16 18  '$ Temp: 97.7 F (36.5 C) 98.1 F (36.7 C) 98.5 F (36.9 C) 98.6 F (37 C)  TempSrc:  Oral Oral Oral  SpO2: 98% 100% 99% 99%  Weight:      Height:        Intake/Output Summary (Last 24 hours) at 11/04/2022 0934 Last data filed at 11/04/2022 0421 Gross per 24 hour  Intake 600 ml  Output 700 ml  Net -100 ml   Wt Readings from Last 3 Encounters:  11/02/22 131.3 kg  09/10/22 (!) 138.8 kg  06/03/22 125.8 kg    Examination:  Constitutional: NAD Eyes: no scleral icterus ENMT: Mucous membranes are moist.  Neck: normal, supple Respiratory: clear to auscultation bilaterally, no wheezing, no crackles. Normal respiratory effort. No accessory muscle use.  Cardiovascular: Regular rate and rhythm, no murmurs / rubs / gallops. No LE edema.  Abdomen: non distended, no tenderness. Bowel sounds positive.  Musculoskeletal: no clubbing / cyanosis.    Data  Reviewed: I have independently reviewed following labs and imaging studies   CBC Recent Labs  Lab 10/31/22 0934 11/01/22 0423 11/02/22 0309 11/03/22 0506 11/04/22 0212  WBC 3.9* 4.4 4.3 5.0 5.9  HGB 8.8* 8.9* 8.8* 9.0* 8.3*  HCT 26.5* 27.8* 27.5* 26.9* 26.4*  PLT 154 158 154 163 150  MCV 105.6* 108.2* 106.6* 105.1* 108.6*  MCH 35.1* 34.6* 34.1* 35.2* 34.2*  MCHC 33.2 32.0 32.0 33.5 31.4  RDW 17.9* 18.2* 18.3* 18.3* 18.3*    Recent Labs  Lab 10/31/22 0934 11/01/22 0423 11/02/22 0309 11/03/22 0506 11/04/22 0212  NA 134* 133* 133* 135 132*  K 3.8 4.0 4.1 4.2 4.4  CL 99 101 98 103 97*  CO2 23 21* '24 24 23  '$ GLUCOSE 185* 241* 281* 165* 283*  BUN 18 24* '22 23 21  '$ CREATININE 1.11 1.15 1.13 1.15 1.14  CALCIUM 8.4* 8.4* 8.9 8.6* 8.7*  MG 1.8 1.7 1.8 1.8 1.9    ------------------------------------------------------------------------------------------------------------------ No results for input(s): "CHOL", "HDL", "LDLCALC", "TRIG", "CHOLHDL", "LDLDIRECT" in the last 72 hours.  Lab Results  Component Value Date   HGBA1C 5.3 09/09/2022   ------------------------------------------------------------------------------------------------------------------ No results for input(s): "TSH", "T4TOTAL", "T3FREE", "THYROIDAB" in the last 72 hours.  Invalid input(s): "FREET3"  Cardiac Enzymes No results for input(s): "CKMB", "TROPONINI", "MYOGLOBIN" in the last 168 hours.  Invalid input(s): "CK" ------------------------------------------------------------------------------------------------------------------    Component Value Date/Time   BNP 135.1 (H) 09/09/2022 1042   BNP 94.3 08/15/2013 1834    CBG: Recent Labs  Lab 11/03/22 1615 11/03/22 1818 11/03/22 2006 11/03/22 2313 11/04/22 0825  GLUCAP 126* 132* 115* 276* 132*    Recent Results (from the past 240 hour(s))  Blood Cultures x 2 sites     Status: None   Collection Time: 10/27/22  1:38 PM   Specimen: BLOOD   Result Value Ref Range Status   Specimen Description   Final    BLOOD LEFT ANTECUBITAL Performed at [facility] [address], [place], [place] [address]    Special Requests   Final    BOTTLES DRAWN AEROBIC AND ANAEROBIC Blood Culture adequate volume Performed at [place] [address] [place], [place] [address]    Culture   Final    NO GROWTH 5 DAYS Performed at [facility], [place] [address] [place], [place] [address]    Report Status 11/01/2022 FINAL  Final  Blood Cultures x 2 sites  Status: None   Collection Time: 10/27/22  7:50 PM   Specimen: Left Antecubital; Blood  Result Value Ref Range Status   Specimen Description   Final    LEFT ANTECUBITAL BLOOD Performed at [facility], [address], [place], [place] [address]    Special Requests   Final    BOTTLES DRAWN AEROBIC AND ANAEROBIC Blood Culture adequate volume Performed at [facility] [address], [place], [place] [address]    Culture   Final    NO GROWTH 5 DAYS Performed at [facility], [place] [address], [place], [place] [address]    Report Status 11/01/2022 FINAL  Final  MRSA Next Gen by PCR, Nasal     Status: None   Collection Time: 10/28/22  5:57 PM   Specimen: Nasal Mucosa; Nasal Swab  Result Value Ref Range Status   MRSA by PCR Next Gen NOT DETECTED NOT DETECTED Final    Comment: (NOTE) The GeneXpert MRSA Assay (FDA approved for NASAL specimens only), is one component of a comprehensive MRSA colonization surveillance program. It is not intended to diagnose MRSA infection nor to guide or monitor treatment for MRSA infections. Test performance is not FDA approved in patients less than 66 years old. Performed at [facility], [place] [address], [place],  [address]      Radiology Studies: No results found.   Marzetta Board, MD, PhD [facility]  Between 7 am - 7 pm I am available,  please contact me via Amion (for emergencies) or Securechat (non urgent messages)  Between 7 pm - 7 am I am not available, please contact night coverage MD/APP via Amion

## 2022-11-04 NOTE — Evaluation (Signed)
Physical Therapy Evaluation Patient Details Name: [name] MRN: [id number] DOB: 08/12/1951 Today's Date: 11/04/2022  History of Present Illness  71 y.o. male presents to [facility] hospital on 10/27/2022 with osteomyelitis of L foot. 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 PMH includes obesity, DMII, HTN, HLD, afib, colon cancer, chronic BLE lymphedema.  Clinical Impression  Pt presents to PT with deficits in strength, power, balance, gait, functional mobility. Pt is generally weak and fatigues quickly, requiring PT to move bed behind patient mid-transfer as pt was unable to continue pivot from recliner to bed. Pt is at a high risk for falls and dependent on physical assistance to mobilize at this time. PT  recommends AIR consult at this time, if AIR is not approved the pt will require SNF placement.       Recommendations for follow up therapy are one component of a multi-disciplinary discharge planning process, led by the attending physician.  Recommendations may be updated based on patient status, additional functional criteria and insurance authorization.  Follow Up Recommendations Acute inpatient rehab (3hours/day) (will need SNF if AIR denied)      Assistance Recommended at Discharge PRN  Patient can return home with the following  A lot of help with walking and/or transfers;A lot of help with bathing/dressing/bathroom;Assistance with cooking/housework;Assist for transportation;Help with stairs or ramp for entrance    Equipment Recommendations  (defer to post-acute)  Recommendations for Other Services  Rehab consult    Functional Status Assessment Patient has had a recent decline in their functional status and demonstrates the ability to make significant improvements in function in a reasonable and predictable amount of time.     Precautions / Restrictions Precautions Precautions: Fall Precaution Comments: wound vac Required Braces or Orthoses: Other Brace Other Brace: Limb  protector Restrictions Weight Bearing Restrictions: Yes LLE Weight Bearing: Non weight bearing      Mobility  Bed Mobility Overal bed mobility: Needs Assistance Bed Mobility: Sit to Supine       Sit to supine: Min guard        Transfers Overall transfer level: Needs assistance Equipment used: Rolling walker (2 wheels) Transfers: Sit to/from Stand, Bed to chair/wheelchair/BSC Sit to Stand: Mod assist   Step pivot transfers: Max assist       General transfer comment: pt fatigues during step pivot, PT has to move bed behind pt and guide hips down to mattress as pt is unable to complete pivot    Ambulation/Gait                  Stairs            Wheelchair Mobility    Modified Rankin (Stroke Patients Only)       Balance Overall balance assessment: Needs assistance Sitting-balance support: No upper extremity supported, Feet supported Sitting balance-Leahy Scale: Fair     Standing balance support: Bilateral upper extremity supported, Reliant on assistive device for balance Standing balance-Leahy Scale: Poor                               Pertinent Vitals/Pain Pain Assessment Pain Assessment: Faces Faces Pain Scale: Hurts little more Pain Location: L thigh Pain Descriptors / Indicators: Aching Pain Intervention(s): Monitored during session, RN gave pain meds during session    Home Living Family/patient expects to be discharged to:: Assisted living Living Arrangements: Other (Comment) (roommate) Available Help at Discharge: Available PRN/intermittently Type of Home: Apartment Home Access:  Level entry       Home Layout: One level Home Equipment: Other (comment);Grab bars - toilet (3 wheeled walker) Additional Comments: 3wrw    Prior Function Prior Level of Function : Independent/Modified Independent             Mobility Comments: ambulates with 3 wheeled walker ADLs Comments: sponge bathes only     Hand Dominance    Dominant Hand: Right    Extremity/Trunk Assessment   Upper Extremity Assessment Upper Extremity Assessment: Generalized weakness    Lower Extremity Assessment Lower Extremity Assessment: Generalized weakness    Cervical / Trunk Assessment Cervical / Trunk Assessment: Kyphotic  Communication   Communication: No difficulties  Cognition Arousal/Alertness: Awake/alert Behavior During Therapy: Impulsive Overall Cognitive Status: Impaired/Different from baseline Area of Impairment: Awareness, Safety/judgement                         Safety/Judgement: Decreased awareness of safety, Decreased awareness of deficits Awareness: Emergent            General Comments General comments (skin integrity, edema, etc.): VSS on RA, wound vac intact    Exercises     Assessment/Plan    PT Assessment Patient needs continued PT services  PT Problem List Decreased strength;Decreased activity tolerance;Decreased balance;Decreased mobility;Decreased knowledge of use of DME;Decreased safety awareness;Decreased knowledge of precautions       PT Treatment Interventions DME instruction;Gait training;Functional mobility training;Therapeutic activities;Therapeutic exercise;Balance training;Neuromuscular re-education;Patient/family education    PT Goals (Current goals can be found in the Care Plan section)  Acute Rehab PT Goals Patient Stated Goal: to improve mobility quality and reduce falls risk PT Goal Formulation: With patient Time For Goal Achievement: 11/18/22 Potential to Achieve Goals: Fair    Frequency Min 3X/week     Co-evaluation               AM-PAC PT "6 Clicks" Mobility  Outcome Measure Help needed turning from your back to your side while in a flat bed without using bedrails?: A Little Help needed moving from lying on your back to sitting on the side of a flat bed without using bedrails?: A Little Help needed moving to and from a bed to a chair (including a  wheelchair)?: A Lot Help needed standing up from a chair using your arms (e.g., wheelchair or bedside chair)?: A Lot Help needed to walk in hospital room?: Total Help needed climbing 3-5 steps with a railing? : Total 6 Click Score: 12    End of Session   Activity Tolerance: Patient limited by fatigue Patient left: in bed;with call bell/phone within reach;with bed alarm set Nurse Communication: Mobility status PT Visit Diagnosis: Other abnormalities of gait and mobility (R26.89);Muscle weakness (generalized) (M62.81)    Time: 4239-5320 PT Time Calculation (min) (ACUTE ONLY): 27 min   Charges:   PT Evaluation $PT Eval Low Complexity: [place], PT, DPT Acute Rehabilitation Office [phone]   Zenaida Niece 11/04/2022, 12:50 PM

## 2022-11-04 NOTE — Progress Notes (Signed)
Patient ID: [name], male   DOB: 1951-09-25, 71 y.o.   MRN: [id number] Patient is postoperative day 1 left transtibial amputation.  Patient has no complaints with his leg.  There is no drainage in the wound VAC canister.  Discussed that he only has to wear the limb protector when he is up with therapy and does not need it when in bed or in a chair.

## 2022-11-05 DIAGNOSIS — L03116 Cellulitis of left lower limb: Secondary | ICD-10-CM | POA: Diagnosis not present

## 2022-11-05 LAB — CBC
HCT: 26.1 % — ABNORMAL LOW (ref 39.0–52.0)
Hemoglobin: 8.5 g/dL — ABNORMAL LOW (ref 13.0–17.0)
MCH: 34.7 pg — ABNORMAL HIGH (ref 26.0–34.0)
MCHC: 32.6 g/dL (ref 30.0–36.0)
MCV: 106.5 fL — ABNORMAL HIGH (ref 80.0–100.0)
Platelets: 147 10*3/uL — ABNORMAL LOW (ref 150–400)
RBC: 2.45 MIL/uL — ABNORMAL LOW (ref 4.22–5.81)
RDW: 18.5 % — ABNORMAL HIGH (ref 11.5–15.5)
WBC: 5.9 10*3/uL (ref 4.0–10.5)
nRBC: 0 % (ref 0.0–0.2)

## 2022-11-05 LAB — COMPREHENSIVE METABOLIC PANEL
ALT: 14 U/L (ref 0–44)
AST: 19 U/L (ref 15–41)
Albumin: 2.6 g/dL — ABNORMAL LOW (ref 3.5–5.0)
Alkaline Phosphatase: 56 U/L (ref 38–126)
Anion gap: 12 (ref 5–15)
BUN: 30 mg/dL — ABNORMAL HIGH (ref 8–23)
CO2: 24 mmol/L (ref 22–32)
Calcium: 8.9 mg/dL (ref 8.9–10.3)
Chloride: 97 mmol/L — ABNORMAL LOW (ref 98–111)
Creatinine, Ser: 1.44 mg/dL — ABNORMAL HIGH (ref 0.61–1.24)
GFR, Estimated: 52 mL/min — ABNORMAL LOW (ref >60)
Glucose, Bld: 218 mg/dL — ABNORMAL HIGH (ref 70–99)
Potassium: 4 mmol/L (ref 3.5–5.1)
Sodium: 133 mmol/L — ABNORMAL LOW (ref 135–145)
Total Bilirubin: 0.3 mg/dL (ref 0.3–1.2)
Total Protein: 7.5 g/dL (ref 6.5–8.1)

## 2022-11-05 LAB — GLUCOSE, CAPILLARY
Glucose-Capillary: 272 mg/dL — ABNORMAL HIGH (ref 70–99)
Glucose-Capillary: 88 mg/dL (ref 70–99)
Glucose-Capillary: 177 mg/dL — ABNORMAL HIGH (ref 70–99)
Glucose-Capillary: 151 mg/dL — ABNORMAL HIGH (ref 70–99)

## 2022-11-05 LAB — MAGNESIUM: Magnesium: 1.8 mg/dL (ref 1.7–2.4)

## 2022-11-05 NOTE — PMR Pre-admission (Shared)
PMR Admission Coordinator Pre-Admission Assessment  Patient: [name] is an 71 y.o., male MRN: [id number] DOB: 09/24/1951 Height: _0  (190.5 cm) Weight: 131.3 kg (bed weight)  Insurance Information HMO: yes    PPO:      PCP:      IPA:      80/20:      OTHER:  PRIMARY: [employer] Medicare      Policy#: [id number]      Subscriber: patient CM Name: ***      Phone#: [phone]     Fax#: [phone] Pre-Cert#: [id number]      Employer: *** Benefits:  Phone #: ***     Name: *** Eff. Date: 07/13/22-stil active     Deduct: $226 ($226 met)     Out of Pocket Max: $8,300 [id number] met)      CIR: $2,050 copay/admission      SNF: $0 day copay for days 1-20, $200/day copay days 21-100, limited to 100 days/cal year Outpatient: 80%, 20% co insurance      Home Health: 100%      DME: 80% coverage, 20% co insurance      Providers: patient choice SECONDARY: Medicaid [place]      Policy#: [id number]     Phone#: [phone]   The "Data Collection Information Summary" for patients in Inpatient Rehabilitation Facilities with attached "Privacy Act Justice Records" was provided and verbally reviewed with: Patient  Emergency Contact Information Contact Information     Name Relation Home Work Mobile   [name] [phone]  [phone]       Current Medical History  Patient Admitting Diagnosis: Transtibial Amputation History of Present Illness: [name] is a 71 y.o. male with medical history significant for type 2 diabetes mellitus, paroxysmal atrial fibrillation chronically anticoagulated on Eliquis, anemia of chronic disease associated baseline hemoglobin range 9-11, who is admitted to [facility] on 10/27/2022 with cellulitis of the left foot after presenting from home to [facility] ED complaining of left foot pain.  X-ray of left foot showed soft tissue changes as well as evidence of bony destructive changes of involving the base of the fifth metatarsal,  concerning for osteomyelitis without any subcutaneous gas. MRI left foot showed chronic osteomyelitis with a bony abscess involving the base of the fifth metatarsal with associated pathologic avulsion fracture.  There is also suspicion of septic arthritis of the fifth metatarsal articulation in a seated osteomyelitis of the adjacent cuboid.  There is diffuse cellulitis and myositis. Patient was started on IV antibiotics, IV fluid, pain medicines Patient was transferred to [facility] for amputation. 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 Sharol Given. Therapies are recommending intensive rehab program.    [facility]'s [facility] record from [facility] has been reviewed by the rehabilitation admission coordinator and physician.  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 Yes  Family History   family history includes Alzheimer's disease in his father; Brain cancer in his mother; Cancer in his maternal grandmother; Heart attack in his maternal grandfather; Pneumonia in his paternal grandfather.  Current Medications  Current Facility-Administered Medications:    (feeding supplement) PROSource Plus liquid 30 mL, 30 mL, Oral, TID BM, Newt Minion, MD, 30 mL at 11/05/22 1555   0.9 %  sodium chloride infusion, , Intravenous, Continuous, Newt Minion, MD   acetaminophen (TYLENOL) tablet 325-650 mg, 325-650 mg, Oral, Q6H PRN, Newt Minion, MD, 650 mg at 11/05/22 0335   alum & mag hydroxide-simeth (MAALOX/MYLANTA) 200-200-20 MG/5ML suspension 15-30 mL, 15-30 mL, Oral, Q2H PRN, Newt Minion, MD   apixaban [facility] [place]) tablet 5 mg, 5 mg, Oral, BID, Caren Griffins, MD, 5 mg at 11/05/22 4650   ascorbic acid (VITAMIN C) tablet 1,000 mg, 1,000 mg, Oral, Daily, Newt Minion, MD, 1,000 mg at 11/05/22 0840   atorvastatin (LIPITOR) tablet 20 mg, 20 mg, Oral, Daily, Newt Minion, MD, 20 mg at 11/05/22 3546   bisacodyl (DULCOLAX) EC tablet 5 mg, 5 mg, Oral, Daily PRN, Newt Minion, MD   docusate sodium (COLACE) capsule 100 mg, 100 mg, Oral, Daily, Newt Minion, MD, 100 mg at 11/05/22 0841   fentaNYL (SUBLIMAZE) injection 50 mcg, 50 mcg, Intravenous, Q2H PRN, Newt Minion, MD, 50 mcg at 11/01/22 2025   furosemide (LASIX) tablet 80 mg, 80 mg, Oral, Daily, Newt Minion, MD, 80 mg at 11/05/22 0838   guaiFENesin-dextromethorphan (ROBITUSSIN DM) 100-10 MG/5ML syrup 15 mL, 15 mL, Oral, Q4H PRN, Newt Minion, MD, 15 mL at 11/04/22 1715   hydrALAZINE (APRESOLINE) injection 10 mg, 10 mg, Intravenous, Q4H PRN, Newt Minion, MD   hydrALAZINE (APRESOLINE) injection 5 mg, 5 mg, Intravenous, Q20 Min PRN, Newt Minion, MD   HYDROmorphone (DILAUDID) injection 0.5-1 mg, 0.5-1 mg, Intravenous, Q4H PRN, Newt Minion, MD, 1 mg at 11/04/22 1746   insulin aspart (novoLOG) injection 0-9 Units, 0-9 Units, Subcutaneous, TID WC, Newt Minion, MD, 5 Units at 11/05/22 0933   insulin glargine-yfgn (SEMGLEE) injection 14 Units, 14 Units, Subcutaneous, Daily, Newt Minion, MD, 14 Units at 11/05/22 0933   ipratropium-albuterol (DUONEB) 0.5-2.5 (3) MG/3ML nebulizer solution 3 mL, 3 mL, Nebulization, Q4H PRN, Newt Minion, MD   labetalol (NORMODYNE) injection 10 mg, 10 mg, Intravenous, Q10 min PRN, Newt Minion, MD   magnesium citrate solution 1  Bottle, 1 Bottle, Oral, Once PRN, Newt Minion, MD   magnesium sulfate IVPB 2 g 50 mL, 2 g, Intravenous, Daily PRN, Newt Minion, MD   metoprolol tartrate (LOPRESSOR) injection 2-5 mg, 2-5 mg, Intravenous, Q2H PRN, Newt Minion, MD   metoprolol tartrate (LOPRESSOR) injection 5 mg, 5 mg, Intravenous, Q4H PRN, Newt Minion, MD   multivitamin (RENA-VIT) tablet 1 tablet, 1 tablet, Oral, QHS, Newt Minion, MD, 1 tablet at 11/04/22 2020   naloxone [facility]) injection 0.4 mg, 0.4 mg, Intravenous, PRN, Newt Minion, MD   nutrition supplement (JUVEN) (JUVEN) powder packet 1 packet, 1 packet, Oral, BID BM, Newt Minion, MD, 1 packet at 11/05/22 1555   ondansetron (ZOFRAN) injection 4 mg, 4 mg, Intravenous, Q6H PRN, Newt Minion, MD   oxyCODONE (Oxy IR/ROXICODONE) immediate release tablet 10-15 mg, 10-15 mg, Oral, Q4H PRN, Newt Minion, MD, 15 mg  at 11/05/22 1556   oxyCODONE (Oxy IR/ROXICODONE) immediate release tablet 5-10 mg, 5-10 mg, Oral, Q4H PRN, Newt Minion, MD   pantoprazole (PROTONIX) EC tablet 40 mg, 40 mg, Oral, Daily, Newt Minion, MD, 40 mg at 11/05/22 0840   phenol (CHLORASEPTIC) mouth spray 1 spray, 1 spray, Mouth/Throat, PRN, Newt Minion, MD   polyethylene glycol (MIRALAX / GLYCOLAX) packet 17 g, 17 g, Oral, Daily PRN, Newt Minion, MD   potassium chloride SA (KLOR-CON M) CR tablet 20-40 mEq, 20-40 mEq, Oral, Daily PRN, Newt Minion, MD   tamsulosin [facility]) capsule 0.4 mg, 0.4 mg, Oral, Daily, Newt Minion, MD, 0.4 mg at 11/05/22 5929   traZODone (DESYREL) tablet 50 mg, 50 mg, Oral, QHS PRN, Newt Minion, MD, 50 mg at 11/03/22 2217   zinc sulfate capsule 220 mg, 220 mg, Oral, Daily, Newt Minion, MD, 220 mg at 11/05/22 0840  Patients Current Diet:  Diet Order             Diet Carb Modified Fluid consistency: Thin; Room service appropriate? Yes  Diet effective now                   Precautions / Restrictions Precautions Precautions:  Fall Precaution Comments: wound vac Other Brace: Limb protector Restrictions Weight Bearing Restrictions: Yes LLE Weight Bearing: Non weight bearing   Has the patient had 2 or more falls or a fall with injury in the past year? No  Prior Activity Level Limited Community (1-2x/wk): used 3 wheeled walker at baseline  Prior Functional Level Self Care: Did the patient need help bathing, dressing, using the toilet or eating? Independent  Indoor Mobility: Did the patient need assistance with walking from room to room (with or without device)? Independent  Stairs: Did the patient need assistance with internal or external stairs (with or without device)? Needed some help  Functional Cognition: Did the patient need help planning regular tasks such as shopping or remembering to take medications? Needed some help  Patient Information Are you of Hispanic, Latino/a,or Spanish origin?: A. No, not of Hispanic, Latino/a, or Spanish origin What is your race?: A. White Do you need or want an interpreter to communicate with a doctor or health care staff?: 0. No  Patient's Response To:  Health Literacy and Transportation Is the patient able to respond to health literacy and transportation needs?: Yes Health Literacy - How often do you need to have someone help you when you read instructions, pamphlets, or other written material from your doctor or pharmacy?: Rarely In the past 12 months, has lack of transportation kept you from medical appointments or from getting medications?: No In the past 12 months, has lack of transportation kept you from meetings, work, or from getting things needed for daily living?: No  Development worker, international aid / [place] Devices/Equipment: Eyeglasses, Environmental consultant (specify type) (reading glasses, urinals, bilateral knee high compression stockings, left orthopedic boot) Home Equipment: Other (comment), Grab bars - toilet (3 wheeled walker)  Prior Device Use: Indicate  devices/aids used by the patient prior to current illness, exacerbation or injury? Walker  Current Functional Level Cognition  Overall Cognitive Status: Within Functional Limits for tasks assessed Orientation Level: Oriented X4 Safety/Judgement: Decreased awareness of safety, Decreased awareness of deficits    Extremity Assessment (includes Sensation/Coordination)  Upper Extremity Assessment: Generalized weakness  Lower Extremity Assessment: Generalized weakness    ADLs  Overall ADL's : Needs assistance/impaired Eating/Feeding: Independent, Sitting Grooming: Wash/dry hands,  Wash/dry face, Set up, Sitting Upper Body Bathing: Set up, Sitting Lower Body Bathing: Moderate assistance, Sit to/from stand Lower Body Bathing Details (indicate cue type and reason): sink in fromt and chair behind Upper Body Dressing : Set up, Sitting Lower Body Dressing: Moderate assistance, Sit to/from stand, Sitting/lateral leans Toilet Transfer: Moderate assistance, Rolling walker (2 wheels), Ambulation, Minimal assistance, Regular Toilet Toileting- Clothing Manipulation and Hygiene: Supervision/safety, Sitting/lateral lean    Mobility  Overal bed mobility: Needs Assistance Bed Mobility: Sit to Supine Sit to supine: Min guard    Transfers  Overall transfer level: Needs assistance Equipment used: Rolling walker (2 wheels) Transfers: Sit to/from Stand, Bed to chair/wheelchair/BSC Sit to Stand: Mod assist Bed to/from chair/wheelchair/BSC transfer type:: Step pivot Step pivot transfers: Max assist General transfer comment: pt fatigues during step pivot, PT has to move bed behind pt and guide hips down to mattress as pt is unable to complete pivot    Ambulation / Gait / Stairs / Wheelchair Mobility       Posture / Balance Balance Overall balance assessment: Needs assistance Sitting-balance support: No upper extremity supported, Feet supported Sitting balance-Leahy Scale: Fair Standing balance  support: Bilateral upper extremity supported, Reliant on assistive device for balance Standing balance-Leahy Scale: Poor    Special needs/care consideration Wound Vac ***, Skin ***, and Diabetic management ***   Previous Home Environment (from acute therapy documentation) Living Arrangements: Other (Comment)  Lives With:  (Assisted Living) Available Help at Discharge: Available PRN/intermittently Type of Home: [place] Name: [place]: One level Home Access: Level entry Bathroom Shower/Tub: Sponge bathes at baseline Bathroom Toilet: Handicapped height Bathroom Accessibility: Yes How Accessible: Accessible via walker [place]: No (has had [facility] in the past) Additional Comments: 3wrw  Discharge Living Setting Plans for Discharge Living Setting: Patient's home, Apartment Type of Home at Discharge: Apartment Discharge Home Layout: One level Discharge Home Access: Level entry Discharge Bathroom Shower/Tub:  (sponge bathes) Discharge Bathroom Toilet: Handicapped height Discharge Bathroom Accessibility: Yes How Accessible: Accessible via walker Does the patient have any problems obtaining your medications?: No  Social/Family/Support Systems Anticipated Caregiver: Joeseph Amor Anticipated Caregiver's Contact Information: [phone] Caregiver Availability: Intermittent Discharge Plan Discussed with Primary Caregiver: Yes Is Caregiver In Agreement with Plan?: Yes Does Caregiver/Family have Issues with Lodging/Transportation while Pt is in Rehab?: No  Goals Patient/Family Goal for Rehab: Mod I PT, OT wheelchair level Expected length of stay: 12-14 days Pt/Family Agrees to Admission and willing to participate: Yes Program Orientation Provided & Reviewed with Pt/Caregiver Including Roles  & Responsibilities: Yes  Barriers to Discharge: Insurance for SNF coverage  Decrease burden of Care through IP rehab admission: Othern/a  Possible need  for SNF placement upon discharge: not aniticpated  Patient Condition: I have reviewed medical records from [facility], spoken with {CHL IP CSW EY:[id number]}, and {CHL IP Patient Spouse Son Daughter Family Member:304550004}. I {CHL IP at bedside or phone:304550005} for inpatient rehabilitation assessment.  Patient will benefit from ongoing {CHL IP PT OT SLP:[id number]}, can actively participate in 3 hours of therapy a day 5 days of the week, and can make measurable gains during the admission.  Patient will also benefit from the coordinated team approach during an Inpatient Acute Rehabilitation admission.  The patient will receive intensive therapy as well as Rehabilitation physician, nursing, social worker, and care management interventions.  Due to {due TR:1735670} the patient requires 24 hour a day rehabilitation nursing.  The patient is currently *** with mobility  and basic ADLs.  Discharge setting and therapy post discharge at [facility] IP discharge location:304550006} is anticipated.  Patient has agreed to participate in the Acute Inpatient Rehabilitation Program and will admit {Time; today/tomorrow:10263}.  Preadmission Screen Completed By:  Nelly Laurence, 11/05/2022 4:04 PM ______________________________________________________________________   Discussed status with Dr. [name] on *** at *** and received approval for admission today.  Admission Coordinator:  Nelly Laurence, time ***/Date ***   Assessment/Plan: Diagnosis: Does the need for close, 24 hr/day Medical supervision in concert with the patient's rehab needs make it unreasonable for this patient to be served in a less intensive setting? {yes_no_potentially:3041433} Co-Morbidities requiring supervision/potential complications: *** Due to {due FO:2774128}, does the patient require 24 hr/day rehab nursing? {yes_no_potentially:3041433} Does the patient require coordinated care of a physician, rehab nurse, PT, OT, and SLP to address physical  and functional deficits in the context of the above medical diagnosis(es)? {yes_no_potentially:3041433} Addressing deficits in the following areas: {deficits:3041436} Can the patient actively participate in an intensive therapy program of at least 3 hrs of therapy 5 days a week? {yes_no_potentially:3041433} The potential for patient to make measurable gains while on inpatient rehab is {potential:3041437} Anticipated functional outcomes upon discharge from inpatient rehab: {functional outcomes:304600100} PT, {functional outcomes:304600100} OT, {functional outcomes:304600100} SLP Estimated rehab length of stay to reach the above functional goals is: *** Anticipated discharge destination: {anticipated dc setting:21604} 10. Overall Rehab/Functional Prognosis: {potential:3041437}   MD Signature: ***

## 2022-11-05 NOTE — Progress Notes (Signed)
Inpatient Rehab Coordinator Note:  I met with patient at bedside to discuss CIR recommendations and goals/expectations of CIR stay.  We reviewed 3 hrs/day of therapy, physician follow up, and average length of stay 2 weeks (dependent upon progress) with goals of mod I wheelchair level. Patient is interested in pursuing CIR admission. He lives in assisted living facility. Will open case with insurance for potential admission early next week if approved.   Rehab Admissons Coordinator [place], [place], [place] [phone]

## 2022-11-05 NOTE — Progress Notes (Signed)
PROGRESS NOTE  [name] XKG:[id number] DOB: 1951/07/31 DOA: 10/27/2022 PCP: Sandi Mariscal, MD   LOS: 9 days   Brief Narrative / Interim history: 71 y.o. male with PMH significant for obesity, DM2, HTN, HLD, A-fib on Eliquis, colon cancer, chronic anemia BPH, chronic bilateral lower extremity lymphedema with venous stasis ulcers chronically on Lasix, who is a resident at [facility] facility.  Initially sent to ED on 11/15 for left foot erythema, swelling and drainage to [facility].  X-ray of the foot showed concerns of possible osteomyelitis, MRI left foot showed chronic osteomyelitis with bony abscess involving fifth metatarsal and pathologic avulsion fracture.  There is also evidence of diffuse cellulitis and myositis.  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  Subjective / 24h Interval events: He does not feel ready to go back home, thinks he needs rehab.  No chest pain, no abdominal pain, no nausea or vomiting  Assesement and Plan: Principal Problem:   Cellulitis of left foot Active Problems:   Paroxysmal atrial fibrillation (HCC)   HLD (hyperlipidemia)   DM2 (diabetes mellitus, type 2) (HCC)   BPH (benign prostatic hyperplasia)   Anemia of chronic disease   Moderate protein-calorie malnutrition (HCC)   Osteomyelitis of left foot (HCC)   Principal problem Left foot cellulitis and myositis Chronic osteomyelitis/abscess of fifth metatarsal with associated fracture Septic arthritis of the fifth metatarsal cuboid joint -Presented with worsening left foot wound symptoms in the setting of diabetes mellitus and chronic lymphedema. X-ray and MRI findings as above showing evidence of cellulitis, myositis, osteomyelitis and septic arthritis.  He was placed on broad-spectrum antibiotics and orthopedic surgery was consulted.  He was taken to the OR on 11/22 and he is status post left BKA.  -PT recommends CIR, awaiting their input.  If they deny, may need to go to SNF  Active  problems Chronic bilateral lower extremity lymphedema Chronic venous stasis ulcers -Continue home Lasix 80 mg p.o. daily. Most recent echo from September 2023 with preserved EF. Wound care consult appreciated.  Continue local wound care   CKD 2 -creatinine slightly up at 1.4 today, encouraged p.o. intake   Paroxysmal A-fib  -continue Eliquis, does not appear to be on rate controlling agents   Type 2 diabetes mellitus, uncontrolled due to hyperglycemia -A1c 5.3 on 09/09/2022.  Home medication metformin and Januvia on hold.  Continue sliding scale and Accu-Cheks.  CBG (last 3)  Recent Labs    11/04/22 1726 11/04/22 1959 11/05/22 0909  GLUCAP 205* 143* 272*    Hyponatremia-mild, monitor   Hyperlipidemia -Lipitor   History of colon cancer -Status post colon resection in the past.   Chronic macrocytic anemia  Chronic vitamin B12 deficiency-Baseline hemoglobin close to 8.  At baseline, no bleeding   BPH -Continue Flomax 0.4 mg daily   Chronic constipation -Bowel regimen with the scheduled Senokot, as needed MiraLAX   ?? Rheumatoid arthritis- Home medication on hold  Scheduled Meds:  (feeding supplement) PROSource Plus  30 mL Oral TID BM   apixaban  5 mg Oral BID   vitamin C  1,000 mg Oral Daily   atorvastatin  20 mg Oral Daily   docusate sodium  100 mg Oral Daily   furosemide  80 mg Oral Daily   insulin aspart  0-9 Units Subcutaneous TID WC   insulin glargine-yfgn  14 Units Subcutaneous Daily   multivitamin  1 tablet Oral QHS   nutrition supplement (JUVEN)  1 packet Oral BID BM  pantoprazole  40 mg Oral Daily   tamsulosin  0.4 mg Oral Daily   zinc sulfate  220 mg Oral Daily   Continuous Infusions:  sodium chloride     magnesium sulfate bolus IVPB     PRN Meds:.acetaminophen, alum & mag hydroxide-simeth, bisacodyl, fentaNYL (SUBLIMAZE) injection, guaiFENesin-dextromethorphan, hydrALAZINE, hydrALAZINE, HYDROmorphone (DILAUDID) injection, ipratropium-albuterol, labetalol,  magnesium citrate, magnesium sulfate bolus IVPB, metoprolol tartrate, metoprolol tartrate, naLOXone (NARCAN)  injection, ondansetron (ZOFRAN) IV, oxyCODONE, oxyCODONE, phenol, polyethylene glycol, potassium chloride, traZODone  Current Outpatient Medications  Medication Instructions   apixaban (ELIQUIS) 5 mg, Oral, 2 times daily   atorvastatin (LIPITOR) 20 mg, Oral, Daily   carbamide peroxide (DEBROX) 6.5 % OTIC solution 5 drops, Right EAR, 2 times daily   cyanocobalamin 1,000 mcg, Oral, Daily   dextrose (GLUTOSE) 40 % GEL 1 Tube, Oral, Once PRN, Give Glucose Gel by mouth for BS<70. Repeat CBG in 16 minutes, Repeat Gel & Recheck CBG in 15 minutes. Call MD if Deemed Necessary. Call 911 if Resident is Symptomatic, Not Alert or Unconscious   docusate sodium (COLACE) 100 mg, Oral, 2 times daily   folic acid (FOLVITE) 1 mg, Oral, See admin instructions, Take 1 mg by mouth at 8 AM daily on Sun/Mon/Tues/Wed/Thurs/Sat ONLY   furosemide (LASIX) 80 mg, Oral, Daily   hydrOXYzine (ATARAX) 25 mg, Oral, Every 8 hours PRN, Use caution as this medication may make you drowsy   Januvia 100 mg, Oral, Daily   metFORMIN (GLUCOPHAGE) 1,000 mg, Oral, 2 times daily with meals   methotrexate (RHEUMATREX) 10 mg, Oral, Every Fri   NONFORMULARY OR COMPOUNDED ITEM 1 application , Topical, See admin instructions, Triamcinolone 0.1% cream + Cetaphil cream 1:4 - apply to lower legs once a day   oxyCODONE (OXY IR/ROXICODONE) 5 mg, Oral, Every 6 hours PRN   pantoprazole (PROTONIX) 40 mg, Oral, Daily   polyethylene glycol (MIRALAX) 17 g, Oral, Daily, Hold for diarrhea   potassium chloride SA (KLOR-CON M) 20 MEQ tablet 20 mEq, Oral, Daily   senna-docusate (SENOKOT-S) 8.6-50 MG tablet 1 tablet, Oral, 2 times daily between meals PRN   tamsulosin (FLOMAX) 0.4 mg, Oral, Daily   terbinafine (LAMISIL) 1 % cream Topical, 2 times daily   triamcinolone (KENALOG) 0.1 % 1 application , Topical, See admin instructions, Apply to itchy areas  2 times a day and and affected areas of the body as needed for dermatitis   zinc oxide 20 % ointment 1 Application, Topical, See admin instructions, Apply to left calf area 2 times a day    Diet Orders (From admission, onward)     Start     Ordered   11/03/22 2145  Diet Carb Modified Fluid consistency: Thin; Room service appropriate? Yes  Diet effective now       Question Answer Comment  Calorie Level Medium 1600-2000   Fluid consistency: Thin   Room service appropriate? Yes      11/03/22 2144            DVT prophylaxis: SCD's Start: 11/03/22 2145 SCDs Start: 10/27/22 1957 apixaban (ELIQUIS) tablet 5 mg   Lab Results  Component Value Date   PLT 147 (L) 11/05/2022      Code Status: Full Code  Family Communication: no family at bedside   Status is: Inpatient  Remains inpatient appropriate because: PT eval, postop care. May need SNF   Level of care: Med-Surg  Consultants:  Orthopedic surgery   Objective: Vitals:   11/04/22 0424 11/04/22 1025 11/04/22 2000  11/05/22 0335  BP: (!) 120/54 (!) 123/103 (!) 123/57 119/65  Pulse: 65 74 88 73  Resp: '16 18 17 19  '$ Temp: 98.5 F (36.9 C) 98.6 F (37 C) 98.2 F (36.8 C) 98.1 F (36.7 C)  TempSrc: Oral Oral Oral Oral  SpO2: 99% 99% 98% 97%  Weight:      Height:        Intake/Output Summary (Last 24 hours) at 11/05/2022 1015 Last data filed at 11/05/2022 7425 Gross per 24 hour  Intake --  Output 1400 ml  Net -1400 ml    Wt Readings from Last 3 Encounters:  11/02/22 131.3 kg  09/10/22 (!) 138.8 kg  06/03/22 125.8 kg    Examination:  Constitutional: NAD Eyes: lids and conjunctivae normal, no scleral icterus ENMT: mmm Neck: normal, supple Respiratory: clear to auscultation bilaterally, no wheezing, no crackles. Normal respiratory effort.  Cardiovascular: Regular rate and rhythm, no murmurs / rubs / gallops. No LE edema. Abdomen: soft, no distention, no tenderness. Bowel sounds positive.  Skin: no  rashes Neurologic: no focal deficits, equal strength   Data Reviewed: I have independently reviewed following labs and imaging studies   CBC Recent Labs  Lab 11/01/22 0423 11/02/22 0309 11/03/22 0506 11/04/22 0212 11/05/22 0315  WBC 4.4 4.3 5.0 5.9 5.9  HGB 8.9* 8.8* 9.0* 8.3* 8.5*  HCT 27.8* 27.5* 26.9* 26.4* 26.1*  PLT 158 154 163 150 147*  MCV 108.2* 106.6* 105.1* 108.6* 106.5*  MCH 34.6* 34.1* 35.2* 34.2* 34.7*  MCHC 32.0 32.0 33.5 31.4 32.6  RDW 18.2* 18.3* 18.3* 18.3* 18.5*     Recent Labs  Lab 11/01/22 0423 11/02/22 0309 11/03/22 0506 11/04/22 0212 11/05/22 0315  NA 133* 133* 135 132* 133*  K 4.0 4.1 4.2 4.4 4.0  CL 101 98 103 97* 97*  CO2 21* '24 24 23 24  '$ GLUCOSE 241* 281* 165* 283* 218*  BUN 24* '22 23 21 '$ 30*  CREATININE 1.15 1.13 1.15 1.14 1.44*  CALCIUM 8.4* 8.9 8.6* 8.7* 8.9  AST  --   --   --   --  19  ALT  --   --   --   --  14  ALKPHOS  --   --   --   --  56  BILITOT  --   --   --   --  0.3  ALBUMIN  --   --   --   --  2.6*  MG 1.7 1.8 1.8 1.9 1.8     ------------------------------------------------------------------------------------------------------------------ No results for input(s): "CHOL", "HDL", "LDLCALC", "TRIG", "CHOLHDL", "LDLDIRECT" in the last 72 hours.  Lab Results  Component Value Date   HGBA1C 5.3 09/09/2022   ------------------------------------------------------------------------------------------------------------------ No results for input(s): "TSH", "T4TOTAL", "T3FREE", "THYROIDAB" in the last 72 hours.  Invalid input(s): "FREET3"  Cardiac Enzymes No results for input(s): "CKMB", "TROPONINI", "MYOGLOBIN" in the last 168 hours.  Invalid input(s): "CK" ------------------------------------------------------------------------------------------------------------------    Component Value Date/Time   BNP 135.1 (H) 09/09/2022 1042   BNP 94.3 08/15/2013 1834    CBG: Recent Labs  Lab 11/04/22 0825 11/04/22 1128  11/04/22 1726 11/04/22 1959 11/05/22 0909  GLUCAP 132* 154* 205* 143* 272*     Recent Results (from the past 240 hour(s))  Blood Cultures x 2 sites     Status: None   Collection Time: 10/27/22  1:38 PM   Specimen: BLOOD  Result Value Ref Range Status   Specimen Description   Final    BLOOD LEFT ANTECUBITAL Performed  at [facility], [place] [address] [place], [place] [address]    Special Requests   Final    BOTTLES DRAWN AEROBIC AND ANAEROBIC Blood Culture adequate volume Performed at [place] [address] [place], [place] [address]    Culture   Final    NO GROWTH 5 DAYS Performed at [facility], [place] [address], [place], [place] [address]    Report Status 11/01/2022 FINAL  Final  Blood Cultures x 2 sites     Status: None   Collection Time: 10/27/22  7:50 PM   Specimen: Left Antecubital; Blood  Result Value Ref Range Status   Specimen Description   Final    LEFT ANTECUBITAL BLOOD Performed at [facility], [place] [address], [place], [place] [address]    Special Requests   Final    BOTTLES DRAWN AEROBIC AND ANAEROBIC Blood Culture adequate volume Performed at [place] [address], [place], [place] [address]    Culture   Final    NO GROWTH 5 DAYS Performed at [facility], [place] [address], [place], [place] [address]    Report Status 11/01/2022 FINAL  Final  MRSA Next Gen by PCR, Nasal     Status: None   Collection Time: 10/28/22  5:57 PM   Specimen: Nasal Mucosa; Nasal Swab  Result Value Ref Range Status   MRSA by PCR Next Gen NOT DETECTED NOT DETECTED Final    Comment: (NOTE) The GeneXpert MRSA Assay (FDA approved for NASAL specimens only), is one component of a comprehensive MRSA colonization surveillance program. It is not intended to diagnose MRSA infection nor to guide or monitor treatment for MRSA infections. Test performance is not FDA approved in patients less than 13  years old. Performed at [facility], [place] [address] [place], [place] [address]      Radiology Studies: No results found.   Marzetta Board, MD, PhD [facility]  Between 7 am - 7 pm I am available, please contact me via Amion (for emergencies) or Securechat (non urgent messages)  Between 7 pm - 7 am I am not available, please contact night coverage MD/APP via Amion

## 2022-11-06 DIAGNOSIS — L03116 Cellulitis of left lower limb: Secondary | ICD-10-CM | POA: Diagnosis not present

## 2022-11-06 LAB — CBC
HCT: 25.6 % — ABNORMAL LOW (ref 39.0–52.0)
Hemoglobin: 8.7 g/dL — ABNORMAL LOW (ref 13.0–17.0)
MCH: 35.1 pg — ABNORMAL HIGH (ref 26.0–34.0)
MCHC: 34 g/dL (ref 30.0–36.0)
MCV: 103.2 fL — ABNORMAL HIGH (ref 80.0–100.0)
Platelets: 156 10*3/uL (ref 150–400)
RBC: 2.48 MIL/uL — ABNORMAL LOW (ref 4.22–5.81)
RDW: 18.1 % — ABNORMAL HIGH (ref 11.5–15.5)
WBC: 5.2 10*3/uL (ref 4.0–10.5)
nRBC: 0 % (ref 0.0–0.2)

## 2022-11-06 LAB — BASIC METABOLIC PANEL
Anion gap: 12 (ref 5–15)
BUN: 32 mg/dL — ABNORMAL HIGH (ref 8–23)
CO2: 26 mmol/L (ref 22–32)
Calcium: 8.9 mg/dL (ref 8.9–10.3)
Chloride: 95 mmol/L — ABNORMAL LOW (ref 98–111)
Creatinine, Ser: 1.23 mg/dL (ref 0.61–1.24)
GFR, Estimated: >60 mL/min (ref >60)
Glucose, Bld: 146 mg/dL — ABNORMAL HIGH (ref 70–99)
Potassium: 3.7 mmol/L (ref 3.5–5.1)
Sodium: 133 mmol/L — ABNORMAL LOW (ref 135–145)

## 2022-11-06 LAB — GLUCOSE, CAPILLARY
Glucose-Capillary: 175 mg/dL — ABNORMAL HIGH (ref 70–99)
Glucose-Capillary: 179 mg/dL — ABNORMAL HIGH (ref 70–99)
Glucose-Capillary: 173 mg/dL — ABNORMAL HIGH (ref 70–99)
Glucose-Capillary: 236 mg/dL — ABNORMAL HIGH (ref 70–99)

## 2022-11-06 LAB — MAGNESIUM: Magnesium: 1.9 mg/dL (ref 1.7–2.4)

## 2022-11-06 NOTE — Progress Notes (Signed)
PROGRESS NOTE  [name] BPZ:[id number] DOB: [birth year] DOA: 10/27/2022 PCP: Sandi Mariscal, MD   LOS: 10 days   Brief Narrative / Interim history: 71 y.o. male with PMH significant for obesity, DM2, HTN, HLD, A-fib on Eliquis, colon cancer, chronic anemia BPH, chronic bilateral lower extremity lymphedema with venous stasis ulcers chronically on Lasix, who is a resident at [facility] facility.  Initially sent to ED on 11/15 for left foot erythema, swelling and drainage to [facility] hospital.  X-ray of the foot showed concerns of possible osteomyelitis, MRI left foot showed chronic osteomyelitis with bony abscess involving fifth metatarsal and pathologic avulsion fracture.  There is also evidence of diffuse cellulitis and myositis.  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  Subjective / 24h Interval events: Doing well this morning, no chest pain, no shortness of breath, no abdominal pain, no nausea or vomiting.  Assesement and Plan: Principal Problem:   Cellulitis of left foot Active Problems:   Paroxysmal atrial fibrillation (HCC)   HLD (hyperlipidemia)   DM2 (diabetes mellitus, type 2) (HCC)   BPH (benign prostatic hyperplasia)   Anemia of chronic disease   Moderate protein-calorie malnutrition (HCC)   Osteomyelitis of left foot (HCC)   Principal problem Left foot cellulitis and myositis Chronic osteomyelitis/abscess of fifth metatarsal with associated fracture Septic arthritis of the fifth metatarsal cuboid joint -Presented with worsening left foot wound symptoms in the setting of diabetes mellitus and chronic lymphedema. X-ray and MRI findings as above showing evidence of cellulitis, myositis, osteomyelitis and septic arthritis.  He was placed on broad-spectrum antibiotics and orthopedic surgery was consulted.  He was taken to the OR on 11/22 and he is status post left BKA.  -PT recommends CIR, insurance auth next week  Active problems Chronic bilateral lower extremity  lymphedema Chronic venous stasis ulcers -Continue home Lasix 80 mg p.o. daily. Most recent echo from September 2023 with preserved EF. Wound care consult appreciated.  Continue local wound care   CKD 2 -creatinine stable and at baseline   Paroxysmal A-fib  -continue Eliquis, does not appear to be on rate controlling agents   Type 2 diabetes mellitus, uncontrolled due to hyperglycemia -A1c 5.3 on 09/09/2022.  Home medication metformin and Januvia on hold.  Continue sliding scale and Accu-Cheks.  CBG (last 3)  Recent Labs    11/05/22 1852 11/05/22 2217 11/06/22 0751  GLUCAP 177* 151* 175*    Hyponatremia-stable today   Hyperlipidemia -Lipitor   History of colon cancer -Status post colon resection in the past.   Chronic macrocytic anemia  Chronic vitamin B12 deficiency-Baseline hemoglobin close to 8.  At baseline, no bleeding   BPH -Continue Flomax 0.4 mg daily   Chronic constipation -Bowel regimen with the scheduled Senokot, as needed MiraLAX   ?? Rheumatoid arthritis- Home medication on hold  Scheduled Meds:  (feeding supplement) PROSource Plus  30 mL Oral TID BM   apixaban  5 mg Oral BID   vitamin C  1,000 mg Oral Daily   atorvastatin  20 mg Oral Daily   docusate sodium  100 mg Oral Daily   furosemide  80 mg Oral Daily   insulin aspart  0-9 Units Subcutaneous TID WC   insulin glargine-yfgn  14 Units Subcutaneous Daily   multivitamin  1 tablet Oral QHS   nutrition supplement (JUVEN)  1 packet Oral BID BM   pantoprazole  40 mg Oral Daily   tamsulosin  0.4 mg Oral Daily   zinc sulfate  220 mg Oral Daily   Continuous Infusions:  sodium chloride     magnesium sulfate bolus IVPB     PRN Meds:.acetaminophen, alum & mag hydroxide-simeth, bisacodyl, fentaNYL (SUBLIMAZE) injection, guaiFENesin-dextromethorphan, hydrALAZINE, hydrALAZINE, HYDROmorphone (DILAUDID) injection, ipratropium-albuterol, labetalol, magnesium citrate, magnesium sulfate bolus IVPB, metoprolol tartrate,  metoprolol tartrate, naLOXone (NARCAN)  injection, ondansetron (ZOFRAN) IV, oxyCODONE, oxyCODONE, phenol, polyethylene glycol, potassium chloride, traZODone  Current Outpatient Medications  Medication Instructions   apixaban (ELIQUIS) 5 mg, Oral, 2 times daily   atorvastatin (LIPITOR) 20 mg, Oral, Daily   carbamide peroxide (DEBROX) 6.5 % OTIC solution 5 drops, Right EAR, 2 times daily   cyanocobalamin 1,000 mcg, Oral, Daily   dextrose (GLUTOSE) 40 % GEL 1 Tube, Oral, Once PRN, Give Glucose Gel by mouth for BS<70. Repeat CBG in 16 minutes, Repeat Gel & Recheck CBG in 15 minutes. Call MD if Deemed Necessary. Call 911 if Resident is Symptomatic, Not Alert or Unconscious   docusate sodium (COLACE) 100 mg, Oral, 2 times daily   folic acid (FOLVITE) 1 mg, Oral, See admin instructions, Take 1 mg by mouth at 8 AM daily on Sun/Mon/Tues/Wed/Thurs/Sat ONLY   furosemide (LASIX) 80 mg, Oral, Daily   hydrOXYzine (ATARAX) 25 mg, Oral, Every 8 hours PRN, Use caution as this medication may make you drowsy   Januvia 100 mg, Oral, Daily   metFORMIN (GLUCOPHAGE) 1,000 mg, Oral, 2 times daily with meals   methotrexate (RHEUMATREX) 10 mg, Oral, Every Fri   NONFORMULARY OR COMPOUNDED ITEM 1 application , Topical, See admin instructions, Triamcinolone 0.1% cream + Cetaphil cream 1:4 - apply to lower legs once a day   oxyCODONE (OXY IR/ROXICODONE) 5 mg, Oral, Every 6 hours PRN   pantoprazole (PROTONIX) 40 mg, Oral, Daily   polyethylene glycol (MIRALAX) 17 g, Oral, Daily, Hold for diarrhea   potassium chloride SA (KLOR-CON M) 20 MEQ tablet 20 mEq, Oral, Daily   senna-docusate (SENOKOT-S) 8.6-50 MG tablet 1 tablet, Oral, 2 times daily between meals PRN   tamsulosin (FLOMAX) 0.4 mg, Oral, Daily   terbinafine (LAMISIL) 1 % cream Topical, 2 times daily   triamcinolone (KENALOG) 0.1 % 1 application , Topical, See admin instructions, Apply to itchy areas 2 times a day and and affected areas of the body as needed for  dermatitis   zinc oxide 20 % ointment 1 Application, Topical, See admin instructions, Apply to left calf area 2 times a day    Diet Orders (From admission, onward)     Start     Ordered   11/06/22 1037  Diet regular Room service appropriate? Yes; Fluid consistency: Thin  Diet effective now       Question Answer Comment  Room service appropriate? Yes   Fluid consistency: Thin      11/06/22 1036            DVT prophylaxis: SCD's Start: 11/03/22 2145 SCDs Start: 10/27/22 1957 apixaban (ELIQUIS) tablet 5 mg   Lab Results  Component Value Date   PLT 156 11/06/2022      Code Status: Full Code  Family Communication: no family at bedside   Status is: Inpatient  Remains inpatient appropriate because: CIR pending   Level of care: Med-Surg  Consultants:  Orthopedic surgery   Objective: Vitals:   11/05/22 1204 11/05/22 1948 11/06/22 0436 11/06/22 0752  BP: (!) 136/53 139/75 115/61 (!) 123/49  Pulse: 80 88  69  Resp: '16 19 18 17  '$ Temp: 98 F (36.7 C) 99.2 F (37.3 C)  97.7 F (36.5 C) 98.1 F (36.7 C)  TempSrc: Oral Oral Oral Oral  SpO2: 96% 98% 96% 97%  Weight:      Height:        Intake/Output Summary (Last 24 hours) at 11/06/2022 1106 Last data filed at 11/06/2022 0900 Gross per 24 hour  Intake 240 ml  Output 2050 ml  Net -1810 ml    Wt Readings from Last 3 Encounters:  11/02/22 131.3 kg  09/10/22 (!) 138.8 kg  06/03/22 125.8 kg    Examination:  Constitutional: NAD Eyes: lids and conjunctivae normal, no scleral icterus ENMT: mmm Neck: normal, supple Respiratory: clear to auscultation bilaterally, no wheezing, no crackles. Normal respiratory effort.  Cardiovascular: Regular rate and rhythm, no murmurs / rubs / gallops. No LE edema. Abdomen: soft, no distention, no tenderness. Bowel sounds positive.  Skin: no rashes Neurologic: no focal deficits, equal strength   Data Reviewed: I have independently reviewed following labs and imaging studies    CBC Recent Labs  Lab 11/02/22 0309 11/03/22 0506 11/04/22 0212 11/05/22 0315 11/06/22 0314  WBC 4.3 5.0 5.9 5.9 5.2  HGB 8.8* 9.0* 8.3* 8.5* 8.7*  HCT 27.5* 26.9* 26.4* 26.1* 25.6*  PLT 154 163 150 147* 156  MCV 106.6* 105.1* 108.6* 106.5* 103.2*  MCH 34.1* 35.2* 34.2* 34.7* 35.1*  MCHC 32.0 33.5 31.4 32.6 34.0  RDW 18.3* 18.3* 18.3* 18.5* 18.1*     Recent Labs  Lab 11/02/22 0309 11/03/22 0506 11/04/22 0212 11/05/22 0315 11/06/22 0314  NA 133* 135 132* 133* 133*  K 4.1 4.2 4.4 4.0 3.7  CL 98 103 97* 97* 95*  CO2 '24 24 23 24 26  '$ GLUCOSE 281* 165* 283* 218* 146*  BUN '22 23 21 '$ 30* 32*  CREATININE 1.13 1.15 1.14 1.44* 1.23  CALCIUM 8.9 8.6* 8.7* 8.9 8.9  AST  --   --   --  19  --   ALT  --   --   --  14  --   ALKPHOS  --   --   --  56  --   BILITOT  --   --   --  0.3  --   ALBUMIN  --   --   --  2.6*  --   MG 1.8 1.8 1.9 1.8 1.9     ------------------------------------------------------------------------------------------------------------------ No results for input(s): "CHOL", "HDL", "LDLCALC", "TRIG", "CHOLHDL", "LDLDIRECT" in the last 72 hours.  Lab Results  Component Value Date   HGBA1C 5.3 09/09/2022   ------------------------------------------------------------------------------------------------------------------ No results for input(s): "TSH", "T4TOTAL", "T3FREE", "THYROIDAB" in the last 72 hours.  Invalid input(s): "FREET3"  Cardiac Enzymes No results for input(s): "CKMB", "TROPONINI", "MYOGLOBIN" in the last 168 hours.  Invalid input(s): "CK" ------------------------------------------------------------------------------------------------------------------    Component Value Date/Time   BNP 135.1 (H) 09/09/2022 1042   BNP 94.3 08/15/2013 1834    CBG: Recent Labs  Lab 11/05/22 0909 11/05/22 1200 11/05/22 1852 11/05/22 2217 11/06/22 0751  GLUCAP 272* 88 177* 151* 175*     Recent Results (from the past 240 hour(s))  Blood Cultures x  2 sites     Status: None   Collection Time: 10/27/22  1:38 PM   Specimen: BLOOD  Result Value Ref Range Status   Specimen Description   Final    BLOOD LEFT ANTECUBITAL Performed at [place] [address] [place], [place] [address]    Special Requests   Final    BOTTLES DRAWN AEROBIC AND ANAEROBIC Blood Culture adequate volume  Performed at [facility], [place] [address] [place], [place] [address]    Culture   Final    NO GROWTH 5 DAYS Performed at [facility], [place] [address] [place], [place] [address]    Report Status 11/01/2022 FINAL  Final  Blood Cultures x 2 sites     Status: None   Collection Time: 10/27/22  7:50 PM   Specimen: Left Antecubital; Blood  Result Value Ref Range Status   Specimen Description   Final    LEFT ANTECUBITAL BLOOD Performed at [facility], [place] [address], [place], [place] [address]    Special Requests   Final    BOTTLES DRAWN AEROBIC AND ANAEROBIC Blood Culture adequate volume Performed at [place] [address], [place], [place] [address]    Culture   Final    NO GROWTH 5 DAYS Performed at [facility], [place] [address] [place], [place] [address]    Report Status 11/01/2022 FINAL  Final  MRSA Next Gen by PCR, Nasal     Status: None   Collection Time: 10/28/22  5:57 PM   Specimen: Nasal Mucosa; Nasal Swab  Result Value Ref Range Status   MRSA by PCR Next Gen NOT DETECTED NOT DETECTED Final    Comment: (NOTE) The GeneXpert MRSA Assay (FDA approved for NASAL specimens only), is one component of a comprehensive MRSA colonization surveillance program. It is not intended to diagnose MRSA infection nor to guide or monitor treatment for MRSA infections. Test performance is not FDA approved in patients less than 81 years old. Performed at [facility], [place] [address], [place], [place] [address]      Radiology Studies: No results  found.   Marzetta Board, MD, PhD [facility]  Between 7 am - 7 pm I am available, please contact me via Amion (for emergencies) or Securechat (non urgent messages)  Between 7 pm - 7 am I am not available, please contact night coverage MD/APP via Amion

## 2022-11-06 NOTE — Plan of Care (Signed)

## 2022-11-07 DIAGNOSIS — L03116 Cellulitis of left lower limb: Secondary | ICD-10-CM | POA: Diagnosis not present

## 2022-11-07 LAB — GLUCOSE, CAPILLARY
Glucose-Capillary: 172 mg/dL — ABNORMAL HIGH (ref 70–99)
Glucose-Capillary: 228 mg/dL — ABNORMAL HIGH (ref 70–99)
Glucose-Capillary: 166 mg/dL — ABNORMAL HIGH (ref 70–99)
Glucose-Capillary: 235 mg/dL — ABNORMAL HIGH (ref 70–99)

## 2022-11-07 NOTE — Progress Notes (Signed)
PROGRESS NOTE  FOUNT BAHE CWC:[id number] DOB: [birth year] DOA: 10/27/2022 PCP: Sandi Mariscal, MD   LOS: 11 days   Brief Narrative / Interim history: 71 y.o. male with PMH significant for obesity, DM2, HTN, HLD, A-fib on Eliquis, colon cancer, chronic anemia BPH, chronic bilateral lower extremity lymphedema with venous stasis ulcers chronically on Lasix, who is a resident at [facility] facility.  Initially sent to ED on 11/15 for left foot erythema, swelling and drainage to [facility] long hospital.  X-ray of the foot showed concerns of possible osteomyelitis, MRI left foot showed chronic osteomyelitis with bony abscess involving fifth metatarsal and pathologic avulsion fracture.  There is also evidence of diffuse cellulitis and myositis.  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  Subjective / 24h Interval events: Feeling well today.  Pain controlled.  Awaiting CIR decision  Assesement and Plan: Principal Problem:   Cellulitis of left foot Active Problems:   Paroxysmal atrial fibrillation (HCC)   HLD (hyperlipidemia)   DM2 (diabetes mellitus, type 2) (HCC)   BPH (benign prostatic hyperplasia)   Anemia of chronic disease   Moderate protein-calorie malnutrition (HCC)   Osteomyelitis of left foot (HCC)   Principal problem Left foot cellulitis and myositis Chronic osteomyelitis/abscess of fifth metatarsal with associated fracture Septic arthritis of the fifth metatarsal cuboid joint -Presented with worsening left foot wound symptoms in the setting of diabetes mellitus and chronic lymphedema. X-ray and MRI findings as above showing evidence of cellulitis, myositis, osteomyelitis and septic arthritis.  He was placed on broad-spectrum antibiotics and orthopedic surgery was consulted.  He was taken to the OR on 11/22 and he is status post left BKA.  -PT recommends CIR, insurance auth next week  Active problems Chronic bilateral lower extremity lymphedema Chronic venous stasis ulcers -Continue  home Lasix 80 mg p.o. daily. Most recent echo from September 2023 with preserved EF. Wound care consult appreciated.  Continue local wound care   CKD 2 -creatinine stable and at baseline   Paroxysmal A-fib  -continue Eliquis, does not appear to be on rate controlling agents   Type 2 diabetes mellitus, uncontrolled due to hyperglycemia -A1c 5.3 on 09/09/2022.  Home medication metformin and Januvia on hold.  Continue sliding scale and Accu-Cheks.  CBG (last 3)  Recent Labs    11/06/22 1711 11/06/22 2032 11/07/22 0810  GLUCAP 173* 236* 172*    Hyponatremia-no labs today, repeat tomorrow   Hyperlipidemia -Lipitor   History of colon cancer -Status post colon resection in the past.   Chronic macrocytic anemia  Chronic vitamin B12 deficiency-Baseline hemoglobin close to 8.  At baseline, no bleeding   BPH -Continue Flomax 0.4 mg daily   Chronic constipation -Bowel regimen with the scheduled Senokot, as needed MiraLAX   ?? Rheumatoid arthritis- Home medication on hold  Scheduled Meds:  (feeding supplement) PROSource Plus  30 mL Oral TID BM   apixaban  5 mg Oral BID   vitamin C  1,000 mg Oral Daily   atorvastatin  20 mg Oral Daily   docusate sodium  100 mg Oral Daily   furosemide  80 mg Oral Daily   insulin aspart  0-9 Units Subcutaneous TID WC   insulin glargine-yfgn  14 Units Subcutaneous Daily   multivitamin  1 tablet Oral QHS   nutrition supplement (JUVEN)  1 packet Oral BID BM   pantoprazole  40 mg Oral Daily   tamsulosin  0.4 mg Oral Daily   zinc sulfate  220 mg Oral Daily  Continuous Infusions:  sodium chloride     magnesium sulfate bolus IVPB     PRN Meds:.acetaminophen, alum & mag hydroxide-simeth, bisacodyl, fentaNYL (SUBLIMAZE) injection, guaiFENesin-dextromethorphan, hydrALAZINE, hydrALAZINE, HYDROmorphone (DILAUDID) injection, ipratropium-albuterol, labetalol, magnesium citrate, magnesium sulfate bolus IVPB, metoprolol tartrate, metoprolol tartrate, naLOXone  (NARCAN)  injection, ondansetron (ZOFRAN) IV, oxyCODONE, oxyCODONE, phenol, polyethylene glycol, potassium chloride, traZODone  Current Outpatient Medications  Medication Instructions   apixaban (ELIQUIS) 5 mg, Oral, 2 times daily   atorvastatin (LIPITOR) 20 mg, Oral, Daily   carbamide peroxide (DEBROX) 6.5 % OTIC solution 5 drops, Right EAR, 2 times daily   cyanocobalamin 1,000 mcg, Oral, Daily   dextrose (GLUTOSE) 40 % GEL 1 Tube, Oral, Once PRN, Give Glucose Gel by mouth for BS<70. Repeat CBG in 16 minutes, Repeat Gel & Recheck CBG in 15 minutes. Call MD if Deemed Necessary. Call 911 if Resident is Symptomatic, Not Alert or Unconscious   docusate sodium (COLACE) 100 mg, Oral, 2 times daily   folic acid (FOLVITE) 1 mg, Oral, See admin instructions, Take 1 mg by mouth at 8 AM daily on Sun/Mon/Tues/Wed/Thurs/Sat ONLY   furosemide (LASIX) 80 mg, Oral, Daily   hydrOXYzine (ATARAX) 25 mg, Oral, Every 8 hours PRN, Use caution as this medication may make you drowsy   Januvia 100 mg, Oral, Daily   metFORMIN (GLUCOPHAGE) 1,000 mg, Oral, 2 times daily with meals   methotrexate (RHEUMATREX) 10 mg, Oral, Every Fri   NONFORMULARY OR COMPOUNDED ITEM 1 application , Topical, See admin instructions, Triamcinolone 0.1% cream + Cetaphil cream 1:4 - apply to lower legs once a day   oxyCODONE (OXY IR/ROXICODONE) 5 mg, Oral, Every 6 hours PRN   pantoprazole (PROTONIX) 40 mg, Oral, Daily   polyethylene glycol (MIRALAX) 17 g, Oral, Daily, Hold for diarrhea   potassium chloride SA (KLOR-CON M) 20 MEQ tablet 20 mEq, Oral, Daily   senna-docusate (SENOKOT-S) 8.6-50 MG tablet 1 tablet, Oral, 2 times daily between meals PRN   tamsulosin (FLOMAX) 0.4 mg, Oral, Daily   terbinafine (LAMISIL) 1 % cream Topical, 2 times daily   triamcinolone (KENALOG) 0.1 % 1 application , Topical, See admin instructions, Apply to itchy areas 2 times a day and and affected areas of the body as needed for dermatitis   zinc oxide 20 %  ointment 1 Application, Topical, See admin instructions, Apply to left calf area 2 times a day    Diet Orders (From admission, onward)     Start     Ordered   11/06/22 1037  Diet regular Room service appropriate? Yes; Fluid consistency: Thin  Diet effective now       Question Answer Comment  Room service appropriate? Yes   Fluid consistency: Thin      11/06/22 1036            DVT prophylaxis: SCD's Start: 11/03/22 2145 SCDs Start: 10/27/22 1957 apixaban (ELIQUIS) tablet 5 mg   Lab Results  Component Value Date   PLT 156 11/06/2022      Code Status: Full Code  Family Communication: no family at bedside   Status is: Inpatient  Remains inpatient appropriate because: CIR pending   Level of care: Med-Surg  Consultants:  Orthopedic surgery   Objective: Vitals:   11/06/22 2000 11/07/22 0408 11/07/22 0417 11/07/22 0833  BP: (!) 110/58 117/60  120/63  Pulse: 79 72    Resp: '17 18  18  '$ Temp: 98.5 F (36.9 C) (!) 97.5 F (36.4 C) 97.9 F (36.6 C)  TempSrc: Oral Oral Oral   SpO2: 98% 95%  95%  Weight:      Height:        Intake/Output Summary (Last 24 hours) at 11/07/2022 1122 Last data filed at 11/07/2022 0656 Gross per 24 hour  Intake 680 ml  Output 3100 ml  Net -2420 ml    Wt Readings from Last 3 Encounters:  11/02/22 131.3 kg  09/10/22 (!) 138.8 kg  06/03/22 125.8 kg    Examination:  Constitutional: NAD Respiratory: CTA Cardiovascular: RRR  Data Reviewed: I have independently reviewed following labs and imaging studies   CBC Recent Labs  Lab 11/02/22 0309 11/03/22 0506 11/04/22 0212 11/05/22 0315 11/06/22 0314  WBC 4.3 5.0 5.9 5.9 5.2  HGB 8.8* 9.0* 8.3* 8.5* 8.7*  HCT 27.5* 26.9* 26.4* 26.1* 25.6*  PLT 154 163 150 147* 156  MCV 106.6* 105.1* 108.6* 106.5* 103.2*  MCH 34.1* 35.2* 34.2* 34.7* 35.1*  MCHC 32.0 33.5 31.4 32.6 34.0  RDW 18.3* 18.3* 18.3* 18.5* 18.1*     Recent Labs  Lab 11/02/22 0309 11/03/22 0506  11/04/22 0212 11/05/22 0315 11/06/22 0314  NA 133* 135 132* 133* 133*  K 4.1 4.2 4.4 4.0 3.7  CL 98 103 97* 97* 95*  CO2 '24 24 23 24 26  '$ GLUCOSE 281* 165* 283* 218* 146*  BUN '22 23 21 '$ 30* 32*  CREATININE 1.13 1.15 1.14 1.44* 1.23  CALCIUM 8.9 8.6* 8.7* 8.9 8.9  AST  --   --   --  19  --   ALT  --   --   --  14  --   ALKPHOS  --   --   --  56  --   BILITOT  --   --   --  0.3  --   ALBUMIN  --   --   --  2.6*  --   MG 1.8 1.8 1.9 1.8 1.9     ------------------------------------------------------------------------------------------------------------------ No results for input(s): "CHOL", "HDL", "LDLCALC", "TRIG", "CHOLHDL", "LDLDIRECT" in the last 72 hours.  Lab Results  Component Value Date   HGBA1C 5.3 09/09/2022   ------------------------------------------------------------------------------------------------------------------ No results for input(s): "TSH", "T4TOTAL", "T3FREE", "THYROIDAB" in the last 72 hours.  Invalid input(s): "FREET3"  Cardiac Enzymes No results for input(s): "CKMB", "TROPONINI", "MYOGLOBIN" in the last 168 hours.  Invalid input(s): "CK" ------------------------------------------------------------------------------------------------------------------    Component Value Date/Time   BNP 135.1 (H) 09/09/2022 1042   BNP 94.3 08/15/2013 1834    CBG: Recent Labs  Lab 11/06/22 0751 11/06/22 1229 11/06/22 1711 11/06/22 2032 11/07/22 0810  GLUCAP 175* 179* 173* 236* 172*     Recent Results (from the past 240 hour(s))  MRSA Next Gen by PCR, Nasal     Status: None   Collection Time: 10/28/22  5:57 PM   Specimen: Nasal Mucosa; Nasal Swab  Result Value Ref Range Status   MRSA by PCR Next Gen NOT DETECTED NOT DETECTED Final    Comment: (NOTE) The GeneXpert MRSA Assay (FDA approved for NASAL specimens only), is one component of a comprehensive MRSA colonization surveillance program. It is not intended to diagnose MRSA infection nor to guide or  monitor treatment for MRSA infections. Test performance is not FDA approved in patients less than 79 years old. Performed at [facility], [place] [address] [place], [place] [address]      Radiology Studies: No results found.   Marzetta Board, MD, PhD [facility]  Between 7 am - 7 pm I am  available, please contact me via Amion (for emergencies) or Securechat (non urgent messages)  Between 7 pm - 7 am I am not available, please contact night coverage MD/APP via Amion

## 2022-11-07 NOTE — Plan of Care (Signed)

## 2022-11-07 NOTE — Plan of Care (Signed)
  Problem: Education: Goal: Ability to describe self-care measures that may prevent or decrease complications (Diabetes Survival Skills Education) will improve Outcome: Progressing   Problem: Nutritional: Goal: Maintenance of adequate nutrition will improve Outcome: Progressing   Problem: Skin Integrity: Goal: Risk for impaired skin integrity will decrease Outcome: Progressing   

## 2022-11-08 ENCOUNTER — Other Ambulatory Visit: Payer: Self-pay

## 2022-11-08 ENCOUNTER — Encounter (HOSPITAL_COMMUNITY): Payer: Self-pay | Admitting: Orthopedic Surgery

## 2022-11-08 DIAGNOSIS — L03116 Cellulitis of left lower limb: Secondary | ICD-10-CM | POA: Diagnosis not present

## 2022-11-08 LAB — COMPREHENSIVE METABOLIC PANEL
ALT: 16 U/L (ref 0–44)
AST: 20 U/L (ref 15–41)
Albumin: 2.6 g/dL — ABNORMAL LOW (ref 3.5–5.0)
Alkaline Phosphatase: 56 U/L (ref 38–126)
Anion gap: 11 (ref 5–15)
BUN: 46 mg/dL — ABNORMAL HIGH (ref 8–23)
CO2: 24 mmol/L (ref 22–32)
Calcium: 8.4 mg/dL — ABNORMAL LOW (ref 8.9–10.3)
Chloride: 95 mmol/L — ABNORMAL LOW (ref 98–111)
Creatinine, Ser: 1.22 mg/dL (ref 0.61–1.24)
GFR, Estimated: >60 mL/min (ref >60)
Glucose, Bld: 217 mg/dL — ABNORMAL HIGH (ref 70–99)
Potassium: 3.9 mmol/L (ref 3.5–5.1)
Sodium: 130 mmol/L — ABNORMAL LOW (ref 135–145)
Total Bilirubin: 0.4 mg/dL (ref 0.3–1.2)
Total Protein: 7.8 g/dL (ref 6.5–8.1)

## 2022-11-08 LAB — CBC
HCT: 26.6 % — ABNORMAL LOW (ref 39.0–52.0)
Hemoglobin: 8.6 g/dL — ABNORMAL LOW (ref 13.0–17.0)
MCH: 34.3 pg — ABNORMAL HIGH (ref 26.0–34.0)
MCHC: 32.3 g/dL (ref 30.0–36.0)
MCV: 106 fL — ABNORMAL HIGH (ref 80.0–100.0)
Platelets: 171 10*3/uL (ref 150–400)
RBC: 2.51 MIL/uL — ABNORMAL LOW (ref 4.22–5.81)
RDW: 17.6 % — ABNORMAL HIGH (ref 11.5–15.5)
WBC: 6 10*3/uL (ref 4.0–10.5)
nRBC: 0 % (ref 0.0–0.2)

## 2022-11-08 LAB — GLUCOSE, CAPILLARY
Glucose-Capillary: 183 mg/dL — ABNORMAL HIGH (ref 70–99)
Glucose-Capillary: 208 mg/dL — ABNORMAL HIGH (ref 70–99)
Glucose-Capillary: 242 mg/dL — ABNORMAL HIGH (ref 70–99)
Glucose-Capillary: 258 mg/dL — ABNORMAL HIGH (ref 70–99)

## 2022-11-08 LAB — MAGNESIUM: Magnesium: 1.9 mg/dL (ref 1.7–2.4)

## 2022-11-08 NOTE — NC FL2 (Signed)
[place] LEVEL OF CARE SCREENING TOOL     IDENTIFICATION  Patient Name: [name] Birthdate: 01/31/1951 Sex: male Admission Date (Current Location): 10/27/2022  [name] and [place] Number:  [name] [id number] [place] and Address:  [facility] [place], [place] [address] [place], [place] [address]      Provider Number: [id number]  Attending Physician Name and Address:  Caren Griffins, MD  Relative Name and Phone Number:  [name] [phone]    Current Level of Care: Hospital Recommended Level of Care: [place] Prior Approval Number:    Date Approved/Denied:   PASRR Number:    Discharge Plan: SNF    Current Diagnoses: Patient Active Problem List   Diagnosis Date Noted   Osteomyelitis of left foot ([place]) 10/30/2022   Cellulitis of left foot 10/27/2022   IDA (iron deficiency anemia) 09/10/2022   Chronic diastolic CHF (congestive heart failure) ([place]) 09/10/2022   Chronic anticoagulation 09/09/2022   Personal history of cecal colon cancer 09/09/2022   Anemia of chronic disease 09/09/2022   Venous stasis ulcers- BLE 09/09/2022   Acute cholecystitis due to biliary calculus 09/09/2022   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   Subacute eczematous & allergic contact dermatitis  09/09/2022   History of COVID-19 09/09/2022   History of homelessness 09/09/2022   Diverticulosis of colon 09/09/2022   Cholelithiasis 09/09/2022   Hepatic steatosis 09/09/2022   Living in assisted living 09/09/2022   Pain in right foot 05/06/2022   Atrial fibrillation with slow ventricular response ([place]) 12/23/2021   Pain of joint of left ankle and foot 02/26/2021   BPH (benign prostatic hyperplasia) 11/30/2016   Abnormal CT scan, colon    AKI (acute kidney injury) ([place])    Generalized weakness    Abdominal pain    Moderate protein-calorie malnutrition ([place]) 11/10/2016   Constipation, chronic  11/09/2016   CKD (chronic kidney disease) stage 3, GFR 30-59 ml/min (HCC) 11/09/2016   Hyperbilirubinemia 11/09/2016   Hyponatremia 11/08/2016   RBBB 09/13/2013   Snoring 09/13/2013   Edema 09/07/2013   DM2 (diabetes mellitus, type 2) ([place]) 03/16/2013   Arthritis 03/16/2013   Essential hypertension, benign 02/23/2013   Morbid obesity ([place]) 02/23/2013   Paroxysmal atrial fibrillation ([place]) 02/23/2013   HLD (hyperlipidemia) 02/23/2013    Orientation RESPIRATION BLADDER Height & Weight     Self, Time, Situation, Place  Normal Continent Weight: 289 lb 7.4 oz (131.3 kg) (bed weight) Height:  '6\' 3"'$  (190.5 cm)  BEHAVIORAL SYMPTOMS/MOOD NEUROLOGICAL BOWEL NUTRITION STATUS      Continent Diet (see discharge summary)  AMBULATORY STATUS COMMUNICATION OF NEEDS Skin   Limited Assist Verbally Surgical wounds                       Personal Care Assistance Level of Assistance  Bathing, Feeding, Dressing Bathing Assistance: Limited assistance Feeding assistance: Independent Dressing Assistance: Limited assistance     Functional Limitations Info  Sight, Hearing, Speech Sight Info: Adequate Hearing Info: Adequate Speech Info: Adequate    SPECIAL CARE FACTORS FREQUENCY  PT (By licensed PT), OT (By licensed OT)     PT Frequency: 5x week OT Frequency: 5x week            Contractures Contractures Info: Not present    Additional Factors Info  Code Status, Allergies, Insulin Sliding Scale Code Status Info: full Allergies Info: Other   Insulin Sliding Scale Info: Novolog: see discharge summary  Current Medications (11/08/2022):  This is the current hospital active medication list Current Facility-Administered Medications  Medication Dose Route Frequency Provider Last Rate Last Admin   (feeding supplement) PROSource Plus liquid 30 mL  30 mL Oral TID BM Newt Minion, MD   30 mL at 11/08/22 1329   0.9 %  sodium chloride infusion   Intravenous Continuous Newt Minion,  MD       acetaminophen (TYLENOL) tablet 325-650 mg  325-650 mg Oral Q6H PRN Newt Minion, MD   650 mg at 11/08/22 1114   alum & mag hydroxide-simeth (MAALOX/MYLANTA) 200-200-20 MG/5ML suspension 15-30 mL  15-30 mL Oral Q2H PRN Newt Minion, MD       apixaban Arne [place]) tablet 5 mg  5 mg Oral BID Caren Griffins, MD   5 mg at 11/08/22 1115   ascorbic acid (VITAMIN C) tablet 1,000 mg  1,000 mg Oral Daily Newt Minion, MD   1,000 mg at 11/08/22 1115   atorvastatin (LIPITOR) tablet 20 mg  20 mg Oral Daily Newt Minion, MD   20 mg at 11/08/22 1115   bisacodyl (DULCOLAX) EC tablet 5 mg  5 mg Oral Daily PRN Newt Minion, MD       docusate sodium (COLACE) capsule 100 mg  100 mg Oral Daily Newt Minion, MD   100 mg at 11/08/22 1115   fentaNYL (SUBLIMAZE) injection 50 mcg  50 mcg Intravenous Q2H PRN Newt Minion, MD   50 mcg at 11/01/22 2025   guaiFENesin-dextromethorphan (ROBITUSSIN DM) 100-10 MG/5ML syrup 15 mL  15 mL Oral Q4H PRN Newt Minion, MD   15 mL at 11/06/22 2002   hydrALAZINE (APRESOLINE) injection 10 mg  10 mg Intravenous Q4H PRN Newt Minion, MD       hydrALAZINE (APRESOLINE) injection 5 mg  5 mg Intravenous Q20 Min PRN Newt Minion, MD       HYDROmorphone (DILAUDID) injection 0.5-1 mg  0.5-1 mg Intravenous Q4H PRN Newt Minion, MD   1 mg at 11/04/22 1746   insulin aspart (novoLOG) injection 0-9 Units  0-9 Units Subcutaneous TID WC Newt Minion, MD   3 Units at 11/08/22 1140   insulin glargine-yfgn (SEMGLEE) injection 14 Units  14 Units Subcutaneous Daily Newt Minion, MD   14 Units at 11/08/22 1116   ipratropium-albuterol (DUONEB) 0.5-2.5 (3) MG/3ML nebulizer solution 3 mL  3 mL Nebulization Q4H PRN Newt Minion, MD       labetalol (NORMODYNE) injection 10 mg  10 mg Intravenous Q10 min PRN Newt Minion, MD       magnesium citrate solution 1 Bottle  1 Bottle Oral Once PRN Newt Minion, MD       magnesium sulfate IVPB 2 g 50 mL  2 g Intravenous Daily PRN Newt Minion, MD       metoprolol tartrate (LOPRESSOR) injection 2-5 mg  2-5 mg Intravenous Q2H PRN Newt Minion, MD       metoprolol tartrate (LOPRESSOR) injection 5 mg  5 mg Intravenous Q4H PRN Newt Minion, MD       multivitamin (RENA-VIT) tablet 1 tablet  1 tablet Oral QHS Newt Minion, MD   1 tablet at 11/07/22 2020   naloxone [facility]) injection 0.4 mg  0.4 mg Intravenous PRN Newt Minion, MD       nutrition supplement (JUVEN) (JUVEN) powder packet 1 packet  1 packet Oral BID  BM Newt Minion, MD   1 packet at 11/08/22 1329   ondansetron (ZOFRAN) injection 4 mg  4 mg Intravenous Q6H PRN Newt Minion, MD       oxyCODONE (Oxy IR/ROXICODONE) immediate release tablet 10-15 mg  10-15 mg Oral Q4H PRN Newt Minion, MD   15 mg at 11/08/22 1327   oxyCODONE (Oxy IR/ROXICODONE) immediate release tablet 5-10 mg  5-10 mg Oral Q4H PRN Newt Minion, MD       pantoprazole (PROTONIX) EC tablet 40 mg  40 mg Oral Daily Newt Minion, MD   40 mg at 11/08/22 1115   phenol (CHLORASEPTIC) mouth spray 1 spray  1 spray Mouth/Throat PRN Newt Minion, MD       polyethylene glycol (MIRALAX / GLYCOLAX) packet 17 g  17 g Oral Daily PRN Newt Minion, MD       potassium chloride SA (KLOR-CON M) CR tablet 20-40 mEq  20-40 mEq Oral Daily PRN Newt Minion, MD       tamsulosin [facility]) capsule 0.4 mg  0.4 mg Oral Daily Newt Minion, MD   0.4 mg at 11/08/22 1115   traZODone (DESYREL) tablet 50 mg  50 mg Oral QHS PRN Newt Minion, MD   50 mg at 11/07/22 2019   zinc sulfate capsule 220 mg  220 mg Oral Daily Newt Minion, MD   220 mg at 11/08/22 1114     Discharge Medications: Please see discharge summary for a list of discharge medications.  Relevant Imaging Results:  Relevant Lab Results:   Additional Information SSN [id number], pt is vaccinated for covid with multiple boosters.  Joanne Chars, LCSW

## 2022-11-08 NOTE — TOC Initial Note (Signed)
Transition of Care [facility]) - Initial/Assessment Note    Patient Details  Name: [name] MRN: [id number] Date of Birth: 1951/09/01  Transition of Care [facility]) CM/SW Contact:    Joanne Chars, LCSW Phone Number: 11/08/2022, 4:06 PM  Clinical Narrative:     CSW informed pt CIR [facility] Kaufmann was not approved.  CSW spoke with pt about pursuing SNF option and pt is agreeable to this.  Pt is from [place] and will return after SNF.  Permission given to send out referral in hub.  Permission given to speak with friend Lala Lund, sister Hilda Blades.  Pt is vaccinated for covid with multiple boosters.               Expected Discharge Plan: Skilled Nursing Facility Barriers to Discharge: Continued Medical Work up, SNF Pending bed offer   Patient Goals and CMS Choice Patient states their goals for this hospitalization and ongoing recovery are:: walk with a walker CMS Medicare.gov Compare Post Acute Care list provided to:: Patient Choice offered to / list presented to : Patient  Expected Discharge Plan and Services Expected Discharge Plan: [place] In-house Referral: Clinical Social Work Discharge Planning Services: CM Consult Post Acute Care Choice: [place] arrangements for the past 2 months: [place] ([facility])                 DME Arranged: N/A DME Agency: NA                  Prior Living Arrangements/Services Living arrangements for the past 2 months: [facility]) Lives with:: Facility Resident Patient language and need for interpreter reviewed:: Yes Do you feel safe going back to the place where you live?: Yes      Need for Family Participation in Patient Care: No (Comment) Care giver support system in place?: Yes (comment) Current home services: Other (comment) (none) Criminal Activity/Legal Involvement Pertinent to Current Situation/Hospitalization: No - Comment as needed  Activities of  Daily Living Home Assistive Devices/Equipment: Eyeglasses, Gilford Rile (specify type) (reading glasses, urinals, bilateral knee high compression stockings, left orthopedic boot) ADL Screening (condition at time of admission) Patient's cognitive ability adequate to safely complete daily activities?: Yes Is the patient deaf or have difficulty hearing?: Yes (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, states he "broke his ear drum" in the past) Does the patient have difficulty seeing, even when wearing glasses/contacts?: Yes (hx bilateral cataract surgery in 2014) Does the patient have difficulty concentrating, remembering, or making decisions?: No Patient able to express need for assistance with ADLs?: Yes Does the patient have difficulty dressing or bathing?: Yes Independently performs ADLs?: No Communication: Independent Dressing (OT): Independent Grooming: Independent Feeding: Independent Bathing: Independent Toileting: Independent In/Out Bed: Needs assistance (can get in and out of a high bed but needs help with toilet and chair) Walks in Home: Needs assistance Is this a change from baseline?: Pre-admission baseline Does the patient have difficulty walking or climbing stairs?: Yes Weakness of Legs: Both Weakness of Arms/Hands: Both  Permission Sought/Granted Permission sought to share information with : Family Supports Permission granted to share information with : Yes, Verbal Permission Granted  Share Information with NAME: friend Lala Lund, sister Hilda Blades  Permission granted to share info w AGENCY: SNF        Emotional Assessment Appearance:: Appears stated age Attitude/Demeanor/Rapport: Engaged Affect (typically observed): Appropriate, Pleasant Orientation: : Oriented to Self, Oriented to Place, Oriented to  Time, Oriented to Situation  Alcohol / Substance Use: Not Applicable Psych Involvement: No (comment)  Admission diagnosis:  Cellulitis of left foot [L03.116] Patient Active Problem List    Diagnosis Date Noted   Osteomyelitis of left foot ([place]) 10/30/2022   Cellulitis of left foot 10/27/2022   IDA (iron deficiency anemia) 09/10/2022   Chronic diastolic CHF (congestive heart failure) ([place]) 09/10/2022   Chronic anticoagulation 09/09/2022   Personal history of cecal colon cancer 09/09/2022   Anemia of chronic disease 09/09/2022   Venous stasis ulcers- BLE 09/09/2022   Acute cholecystitis due to biliary calculus 09/09/2022   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   Subacute eczematous & allergic contact dermatitis  09/09/2022   History of COVID-19 09/09/2022   History of homelessness 09/09/2022   Diverticulosis of colon 09/09/2022   Cholelithiasis 09/09/2022   Hepatic steatosis 09/09/2022   Living in assisted living 09/09/2022   Pain in right foot 05/06/2022   Atrial fibrillation with slow ventricular response ([place]) 12/23/2021   Pain of joint of left ankle and foot 02/26/2021   BPH (benign prostatic hyperplasia) 11/30/2016   Abnormal CT scan, colon    AKI (acute kidney injury) ([place])    Generalized weakness    Abdominal pain    Moderate protein-calorie malnutrition ([place]) 11/10/2016   Constipation, chronic 11/09/2016   CKD (chronic kidney disease) stage 3, GFR 30-59 ml/min ([place]) 11/09/2016   Hyperbilirubinemia 11/09/2016   Hyponatremia 11/08/2016   RBBB 09/13/2013   Snoring 09/13/2013   Edema 09/07/2013   DM2 (diabetes mellitus, type 2) ([place]) 03/16/2013   Arthritis 03/16/2013   Essential hypertension, benign 02/23/2013   Morbid obesity ([place]) 02/23/2013   Paroxysmal atrial fibrillation ([place]) 02/23/2013   HLD (hyperlipidemia) 02/23/2013   PCP:  Sandi Mariscal, MD Pharmacy:   [place], [place] - [address] [place] [address] Phone: [phone] Fax: [phone]     Social Determinants of Health (SDOH) Interventions Transportation Interventions: Intervention  Not Indicated  Readmission Risk Interventions    10/28/2022   12:37 PM 10/28/2022    8:36 AM  Readmission Risk Prevention Plan  Transportation Screening Complete Complete  PCP or Specialist Appt within 5-7 Days Complete Complete  Home Care Screening Complete Complete  Medication Review (RN CM) Complete Complete

## 2022-11-08 NOTE — Progress Notes (Signed)
Occupational Therapy Treatment Patient Details Name: [name] MRN: [id number] DOB: Feb 03, 1951 Today's Date: 11/08/2022   History of present illness 71 y.o. male presents to [facility] hospital on 10/27/2022 with osteomyelitis of L foot. 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 PMH includes obesity, DMII, HTN, HLD, afib, colon cancer, chronic BLE lymphedema.   OT comments  Patient received in supine and agreeable to OT session and asking to use bathroom. Patient able to get to EOB and was mod assist to stand from raised bed and transfer to recliner. Patient made several attempts to stand from recliner to transfer to toilet but was unable to safely do so. PTA arrived and assisted to toilet with max assist x2. Patient was mod assist of one to stand from toilet with use of rail and max assist for toilet hygiene. Patient able to ambulate with RW to EOB and assisted to supine. Patient left with PTA at end of session. Acute OT to continue to follow.    Recommendations for follow up therapy are one component of a multi-disciplinary discharge planning process, led by the attending physician.  Recommendations may be updated based on patient status, additional functional criteria and insurance authorization.    Follow Up Recommendations  Acute inpatient rehab (3hours/day)     Assistance Recommended at Discharge Frequent or constant Supervision/Assistance  Patient can return home with the following  A lot of help with bathing/dressing/bathroom;Assist for transportation;A lot of help with walking and/or transfers;Help with stairs or ramp for entrance   Equipment Recommendations  Wheelchair (measurements OT);Wheelchair cushion (measurements OT)    Recommendations for Other Services      Precautions / Restrictions Precautions Precautions: Fall Precaution Comments: wound vac Required Braces or Orthoses: Other Brace Other Brace: Limb protector Restrictions Weight Bearing Restrictions: Yes LLE Weight  Bearing: Non weight bearing       Mobility Bed Mobility Overal bed mobility: Needs Assistance Bed Mobility: Supine to Sit, Sit to Supine     Supine to sit: Min guard Sit to supine: Min guard   General bed mobility comments: increased time and use of bed rails    Transfers Overall transfer level: Needs assistance Equipment used: Rolling walker (2 wheels) Transfers: Sit to/from Stand, Bed to chair/wheelchair/BSC Sit to Stand: Mod assist, From elevated surface (max assist x2 from recliner)           General transfer comment: Patient able to stand from EOB with mod assist and from toilet with use of bathroom rails. Max assist x2 to stand from recliner     Balance Overall balance assessment: Needs assistance Sitting-balance support: No upper extremity supported, Feet supported Sitting balance-Leahy Scale: Fair     Standing balance support: Bilateral upper extremity supported, Reliant on assistive device for balance Standing balance-Leahy Scale: Poor Standing balance comment: reliant on UE support when standing                           ADL either performed or assessed with clinical judgement   ADL Overall ADL's : Needs assistance/impaired                         Toilet Transfer: Maximal assistance;+2 for physical assistance;Regular Glass blower/designer Details (indicate cue type and reason): max assist x2 to stand from recliner to transfer to regular toilet, mod assist of one to stand from toilet with use of rail and ambulated to EOB with min assist Toileting- Clothing  Manipulation and Hygiene: Maximal assistance;Sit to/from stand Toileting - Clothing Manipulation Details (indicate cue type and reason): unable to maintain balance while standing to perform toilet hygiene       General ADL Comments: Patient's recliner switched to drop arm to assist with transfers    Extremity/Trunk Assessment              Vision       Perception      Praxis      Cognition Arousal/Alertness: Awake/alert Behavior During Therapy: Impulsive Overall Cognitive Status: Within Functional Limits for tasks assessed Area of Impairment: Awareness, Safety/judgement                     Memory: Decreased short-term memory   Safety/Judgement: Decreased awareness of safety, Decreased awareness of deficits Awareness: Emergent            Exercises      Shoulder Instructions       General Comments      Pertinent Vitals/ Pain       Pain Assessment Pain Assessment: Faces Faces Pain Scale: Hurts little more Pain Location: L thigh Pain Descriptors / Indicators: Aching Pain Intervention(s): Limited activity within patient's tolerance, Monitored during session, Repositioned  Home Living                                          Prior Functioning/Environment              Frequency  Min 2X/week        Progress Toward Goals  OT Goals(current goals can now be found in the care plan section)  Progress towards OT goals: Progressing toward goals  Acute Rehab OT Goals Patient Stated Goal: get better OT Goal Formulation: With patient Time For Goal Achievement: 11/18/22 Potential to Achieve Goals: Good ADL Goals Pt Will Perform Grooming: with supervision;standing;sitting Pt Will Perform Lower Body Bathing: with supervision;sit to/from stand Pt Will Perform Lower Body Dressing: with supervision;sit to/from stand Pt Will Transfer to Toilet: with supervision;ambulating;regular height toilet Pt/caregiver will Perform Home Exercise Program: Increased strength;Both right and left upper extremity;With theraband;With Supervision;With written HEP provided  Plan Discharge plan remains appropriate    Co-evaluation                 AM-PAC OT "6 Clicks" Daily Activity     Outcome Measure   Help from another person eating meals?: None Help from another person taking care of personal grooming?: None Help  from another person toileting, which includes using toliet, bedpan, or urinal?: A Lot Help from another person bathing (including washing, rinsing, drying)?: A Lot Help from another person to put on and taking off regular upper body clothing?: None Help from another person to put on and taking off regular lower body clothing?: A Lot 6 Click Score: 18    End of Session Equipment Utilized During Treatment: Gait belt;Rolling walker (2 wheels)  OT Visit Diagnosis: Unsteadiness on feet (R26.81);Pain Pain - Right/Left: Left Pain - part of body: Leg   Activity Tolerance Patient tolerated treatment well   Patient Left in bed;with call bell/phone within reach;Other (comment) (left with PTA)   Nurse Communication Mobility status        Time: 3220-2542 OT Time Calculation (min): 53 min  Charges: OT General Charges $OT Visit: 1 Visit OT Treatments $Self Care/Home Management : 38-52 mins  Liliane Channel  Roxy Manns, [place]  Office [place] 11/08/2022, 11:11 AM

## 2022-11-08 NOTE — Progress Notes (Addendum)
Inpatient Rehab Admissions Coordinator:   Addendum: patient's insurance denied admission to CIR, patient seeking placement in SNF at this time. Will sign off.  Received call from [facility] offering peer to peer prior to making final decision for CIR admission. Information given to Dr. Cruzita Lederer. Will continue to follow.   Rehab Admissons Coordinator [place], [place], [place] [phone]

## 2022-11-08 NOTE — Progress Notes (Signed)
Patient ID: [name], male   DOB: 22-Sep-1951, 71 y.o.   MRN: [id number] Patient is status post transtibial amputation.  He has no complaints this morning.  There is 100 cc in the wound VAC canister.  Patient being evaluated for discharge to inpatient rehab.  Continue the wound VAC on the Praveena plus portable wound VAC pump at discharge.

## 2022-11-08 NOTE — Progress Notes (Signed)
PROGRESS NOTE  ARBOR COHEN QQI:[id number] DOB: [birth year] DOA: 10/27/2022 PCP: Sandi Mariscal, MD   LOS: 12 days   Brief Narrative / Interim history: 71 y.o. male with PMH significant for obesity, DM2, HTN, HLD, A-fib on Eliquis, colon cancer, chronic anemia BPH, chronic bilateral lower extremity lymphedema with venous stasis ulcers chronically on Lasix, who is a resident at [facility] facility.  Initially sent to ED on 11/15 for left foot erythema, swelling and drainage to [facility] long hospital.  X-ray of the foot showed concerns of possible osteomyelitis, MRI left foot showed chronic osteomyelitis with bony abscess involving fifth metatarsal and pathologic avulsion fracture.  There is also evidence of diffuse cellulitis and myositis.  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  Subjective / 24h Interval events: No complaints.  Medically stable for discharge pending CIR  Assesement and Plan: Principal Problem:   Cellulitis of left foot Active Problems:   Paroxysmal atrial fibrillation (HCC)   HLD (hyperlipidemia)   DM2 (diabetes mellitus, type 2) (HCC)   BPH (benign prostatic hyperplasia)   Anemia of chronic disease   Moderate protein-calorie malnutrition (HCC)   Osteomyelitis of left foot (HCC)   Principal problem Left foot cellulitis and myositis Chronic osteomyelitis/abscess of fifth metatarsal with associated fracture Septic arthritis of the fifth metatarsal cuboid joint -Presented with worsening left foot wound symptoms in the setting of diabetes mellitus and chronic lymphedema. X-ray and MRI findings as above showing evidence of cellulitis, myositis, osteomyelitis and septic arthritis.  He was placed on broad-spectrum antibiotics and orthopedic surgery was consulted.  He was taken to the OR on 11/22 and he is status post left BKA.  -PT recommends CIR, insurance auth pending  Active problems Chronic bilateral lower extremity lymphedema Chronic venous stasis ulcers -He is on home  Lasix 80 mg p.o. daily. Most recent echo from September 2023 with preserved EF. Wound care consult appreciated.  Continue local wound care.  Hold Lasix today due to slightly worsening hyponatremia   CKD 2 -creatinine stable and at baseline.  BUN increased, hold Lasix   Paroxysmal A-fib  -continue Eliquis, does not appear to be on rate controlling agents   Type 2 diabetes mellitus, uncontrolled due to hyperglycemia -A1c 5.3 on 09/09/2022.  Home medication metformin and Januvia on hold.  Continue sliding scale and Accu-Cheks.  CBG (last 3)  Recent Labs    11/07/22 1637 11/07/22 2004 11/08/22 0753  GLUCAP 166* 235* 183*    Hyponatremia-looks more intravascularly depleted with hypochloremic hyponatremia, hold Lasix   Hyperlipidemia -Lipitor   History of colon cancer -Status post colon resection in the past.   Chronic macrocytic anemia  Chronic vitamin B12 deficiency-Baseline hemoglobin close to 8.  At baseline, no bleeding   BPH -Continue Flomax 0.4 mg daily   Chronic constipation -Bowel regimen with the scheduled Senokot, as needed MiraLAX   ?? Rheumatoid arthritis- Home medication on hold  Scheduled Meds:  (feeding supplement) PROSource Plus  30 mL Oral TID BM   apixaban  5 mg Oral BID   vitamin C  1,000 mg Oral Daily   atorvastatin  20 mg Oral Daily   docusate sodium  100 mg Oral Daily   furosemide  80 mg Oral Daily   insulin aspart  0-9 Units Subcutaneous TID WC   insulin glargine-yfgn  14 Units Subcutaneous Daily   multivitamin  1 tablet Oral QHS   nutrition supplement (JUVEN)  1 packet Oral BID BM   pantoprazole  40 mg Oral Daily  tamsulosin  0.4 mg Oral Daily   zinc sulfate  220 mg Oral Daily   Continuous Infusions:  sodium chloride     magnesium sulfate bolus IVPB     PRN Meds:.acetaminophen, alum & mag hydroxide-simeth, bisacodyl, fentaNYL (SUBLIMAZE) injection, guaiFENesin-dextromethorphan, hydrALAZINE, hydrALAZINE, HYDROmorphone (DILAUDID) injection,  ipratropium-albuterol, labetalol, magnesium citrate, magnesium sulfate bolus IVPB, metoprolol tartrate, metoprolol tartrate, naLOXone (NARCAN)  injection, ondansetron (ZOFRAN) IV, oxyCODONE, oxyCODONE, phenol, polyethylene glycol, potassium chloride, traZODone  Current Outpatient Medications  Medication Instructions   apixaban (ELIQUIS) 5 mg, Oral, 2 times daily   atorvastatin (LIPITOR) 20 mg, Oral, Daily   carbamide peroxide (DEBROX) 6.5 % OTIC solution 5 drops, Right EAR, 2 times daily   cyanocobalamin 1,000 mcg, Oral, Daily   dextrose (GLUTOSE) 40 % GEL 1 Tube, Oral, Once PRN, Give Glucose Gel by mouth for BS<70. Repeat CBG in 16 minutes, Repeat Gel & Recheck CBG in 15 minutes. Call MD if Deemed Necessary. Call 911 if Resident is Symptomatic, Not Alert or Unconscious   docusate sodium (COLACE) 100 mg, Oral, 2 times daily   folic acid (FOLVITE) 1 mg, Oral, See admin instructions, Take 1 mg by mouth at 8 AM daily on Sun/Mon/Tues/Wed/Thurs/Sat ONLY   furosemide (LASIX) 80 mg, Oral, Daily   hydrOXYzine (ATARAX) 25 mg, Oral, Every 8 hours PRN, Use caution as this medication may make you drowsy   Januvia 100 mg, Oral, Daily   metFORMIN (GLUCOPHAGE) 1,000 mg, Oral, 2 times daily with meals   methotrexate (RHEUMATREX) 10 mg, Oral, Every Fri   NONFORMULARY OR COMPOUNDED ITEM 1 application , Topical, See admin instructions, Triamcinolone 0.1% cream + Cetaphil cream 1:4 - apply to lower legs once a day   oxyCODONE (OXY IR/ROXICODONE) 5 mg, Oral, Every 6 hours PRN   pantoprazole (PROTONIX) 40 mg, Oral, Daily   polyethylene glycol (MIRALAX) 17 g, Oral, Daily, Hold for diarrhea   potassium chloride SA (KLOR-CON M) 20 MEQ tablet 20 mEq, Oral, Daily   senna-docusate (SENOKOT-S) 8.6-50 MG tablet 1 tablet, Oral, 2 times daily between meals PRN   tamsulosin (FLOMAX) 0.4 mg, Oral, Daily   terbinafine (LAMISIL) 1 % cream Topical, 2 times daily   triamcinolone (KENALOG) 0.1 % 1 application , Topical, See admin  instructions, Apply to itchy areas 2 times a day and and affected areas of the body as needed for dermatitis   zinc oxide 20 % ointment 1 Application, Topical, See admin instructions, Apply to left calf area 2 times a day    Diet Orders (From admission, onward)     Start     Ordered   11/06/22 1037  Diet regular Room service appropriate? Yes; Fluid consistency: Thin  Diet effective now       Question Answer Comment  Room service appropriate? Yes   Fluid consistency: Thin      11/06/22 1036            DVT prophylaxis: SCD's Start: 11/03/22 2145 SCDs Start: 10/27/22 1957 apixaban (ELIQUIS) tablet 5 mg   Lab Results  Component Value Date   PLT 171 11/08/2022      Code Status: Full Code  Family Communication: no family at bedside   Status is: Inpatient  Remains inpatient appropriate because: CIR pending   Level of care: Med-Surg  Consultants:  Orthopedic surgery   Objective: Vitals:   11/07/22 0833 11/07/22 2019 11/08/22 0423 11/08/22 0811  BP: 120/63 121/73 111/79 121/60  Pulse:  80 80 75  Resp: '18 18 13 '$ 12  Temp:  99.1 F (37.3 C) 98.1 F (36.7 C) 98.1 F (36.7 C)  TempSrc:  Oral Oral Oral  SpO2: 95% 96% 98% 97%  Weight:      Height:        Intake/Output Summary (Last 24 hours) at 11/08/2022 1118 Last data filed at 11/08/2022 0425 Gross per 24 hour  Intake 600 ml  Output 1800 ml  Net -1200 ml    Wt Readings from Last 3 Encounters:  11/02/22 131.3 kg  09/10/22 (!) 138.8 kg  06/03/22 125.8 kg    Examination:  Constitutional: NAD Eyes: lids and conjunctivae normal, no scleral icterus ENMT: mmm Neck: normal, supple Respiratory: clear to auscultation bilaterally, no wheezing, no crackles. Normal respiratory effort.  Cardiovascular: Regular rate and rhythm, no murmurs / rubs / gallops. No LE edema. Abdomen: soft, no distention, no tenderness. Bowel sounds positive.  Skin: no rashes Neurologic: no focal deficits, equal strength  Data  Reviewed: I have independently reviewed following labs and imaging studies   CBC Recent Labs  Lab 11/03/22 0506 11/04/22 0212 11/05/22 0315 11/06/22 0314 11/08/22 0346  WBC 5.0 5.9 5.9 5.2 6.0  HGB 9.0* 8.3* 8.5* 8.7* 8.6*  HCT 26.9* 26.4* 26.1* 25.6* 26.6*  PLT 163 150 147* 156 171  MCV 105.1* 108.6* 106.5* 103.2* 106.0*  MCH 35.2* 34.2* 34.7* 35.1* 34.3*  MCHC 33.5 31.4 32.6 34.0 32.3  RDW 18.3* 18.3* 18.5* 18.1* 17.6*     Recent Labs  Lab 11/03/22 0506 11/04/22 0212 11/05/22 0315 11/06/22 0314 11/08/22 0346  NA 135 132* 133* 133* 130*  K 4.2 4.4 4.0 3.7 3.9  CL 103 97* 97* 95* 95*  CO2 '24 23 24 26 24  '$ GLUCOSE 165* 283* 218* 146* 217*  BUN 23 21 30* 32* 46*  CREATININE 1.15 1.14 1.44* 1.23 1.22  CALCIUM 8.6* 8.7* 8.9 8.9 8.4*  AST  --   --  19  --  20  ALT  --   --  14  --  16  ALKPHOS  --   --  56  --  56  BILITOT  --   --  0.3  --  0.4  ALBUMIN  --   --  2.6*  --  2.6*  MG 1.8 1.9 1.8 1.9 1.9     ------------------------------------------------------------------------------------------------------------------ No results for input(s): "CHOL", "HDL", "LDLCALC", "TRIG", "CHOLHDL", "LDLDIRECT" in the last 72 hours.  Lab Results  Component Value Date   HGBA1C 5.3 09/09/2022   ------------------------------------------------------------------------------------------------------------------ No results for input(s): "TSH", "T4TOTAL", "T3FREE", "THYROIDAB" in the last 72 hours.  Invalid input(s): "FREET3"  Cardiac Enzymes No results for input(s): "CKMB", "TROPONINI", "MYOGLOBIN" in the last 168 hours.  Invalid input(s): "CK" ------------------------------------------------------------------------------------------------------------------    Component Value Date/Time   BNP 135.1 (H) 09/09/2022 1042   BNP 94.3 08/15/2013 1834    CBG: Recent Labs  Lab 11/07/22 0810 11/07/22 1220 11/07/22 1637 11/07/22 2004 11/08/22 0753  GLUCAP 172* 228* 166* 235*  183*     No results found for this or any previous visit (from the past 240 hour(s)).    Radiology Studies: No results found.   Marzetta Board, MD, PhD [facility]  Between 7 am - 7 pm I am available, please contact me via Amion (for emergencies) or Securechat (non urgent messages)  Between 7 pm - 7 am I am not available, please contact night coverage MD/APP via Amion

## 2022-11-08 NOTE — Progress Notes (Signed)
Physical Therapy Treatment Patient Details Name: [name] MRN: [id number] DOB: 1951-07-19 Today's Date: 11/08/2022   History of Present Illness 71 y.o. male presents to [facility] hospital on 10/27/2022 with osteomyelitis of L foot. 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 PMH includes obesity, DMII, HTN, HLD, afib, colon cancer, chronic BLE lymphedema.    PT Comments    Pt received seated up in recliner in bathroom in care of COTA, agreeable to therapy session with session dovetail due to pt increased lift assist needed transferring from recliner. Pt needing up to +2 maxA to pivot from recliner to toilet seat but only up to modA from elevated surfaces. Once standing, pt needing at most minA for gait short distances at bedside. HEP brought to room and reviewed with pt receptive to teachback. Of note, pt with excoriation on backside and c/o itchiness. Pt continues to benefit from PT services to progress toward functional mobility goals.    Recommendations for follow up therapy are one component of a multi-disciplinary discharge planning process, led by the attending physician.  Recommendations may be updated based on patient status, additional functional criteria and insurance authorization.  Follow Up Recommendations  Acute inpatient rehab (3hours/day) (will need SNF if AIR denied)     Assistance Recommended at Discharge PRN  Patient can return home with the following A lot of help with walking and/or transfers;A lot of help with bathing/dressing/bathroom;Assistance with cooking/housework;Assist for transportation;Help with stairs or ramp for entrance   Equipment Recommendations   (TBD)    Recommendations for Other Services       Precautions / Restrictions Precautions Precautions: Fall Precaution Comments: wound vac Required Braces or Orthoses: Other Brace Other Brace: Limb protector Restrictions Weight Bearing Restrictions: Yes LLE Weight Bearing: Non weight bearing     Mobility  Bed  Mobility Overal bed mobility: Needs Assistance Bed Mobility: Sit to Supine       Sit to supine: Min guard   General bed mobility comments: use of bed features/rails, HOB elevated, increased time    Transfers Overall transfer level: Needs assistance Equipment used: Rolling walker (2 wheels) Transfers: Sit to/from Stand, Bed to chair/wheelchair/BSC Sit to Stand: Mod assist, From elevated surface (max assist x2 from recliner) Stand pivot transfers: Max assist, +2 physical assistance         General transfer comment: Patient able to stand to/from from elevated height EOB with mod assist and from toilet with use of bathroom rails with modA. Max assist x2 to stand from recliner and to pivot to low toilet seat due to fatigue.    Ambulation/Gait Ambulation/Gait assistance: Min assist, +2 safety/equipment Gait Distance (Feet): 15 Feet Assistive device: Rolling walker (2 wheels) Gait Pattern/deviations:  (hop-to)       General Gait Details: cues for activity pacing and safety, up to minA and second-person guarding for safety due to pt can be quick to fatigue/impulsive but no buckling or overt LOB using RW.   Stairs             Wheelchair Mobility    Modified Rankin (Stroke Patients Only)       Balance Overall balance assessment: Needs assistance Sitting-balance support: No upper extremity supported, Feet supported Sitting balance-Leahy Scale: Fair     Standing balance support: Bilateral upper extremity supported, Reliant on assistive device for balance Standing balance-Leahy Scale: Poor Standing balance comment: reliant on UE support when standing  Cognition Arousal/Alertness: Awake/alert Behavior During Therapy: Impulsive Overall Cognitive Status: Within Functional Limits for tasks assessed Area of Impairment: Awareness, Safety/judgement                     Memory: Decreased short-term memory   Safety/Judgement:  Decreased awareness of safety, Decreased awareness of deficits Awareness: Emergent   General Comments: Self-directed, pt resistant to suggestions on mobility modifications or things such as putting foam dressing over areas of excoriation due to increased risk of infection.        Exercises Amputee Exercises Quad Sets: AROM, AAROM, Left, 5 reps, Supine (tactile cues for technique) Gluteal Sets: 5 reps, Supine Towel Squeeze: AROM, 5 reps, Supine Hip Extension: 5 reps, Supine, Left Hip ABduction/ADduction: AROM, 5 reps, Supine Chair Push Up:  (verbal/visual demo, pt was in supine so did not demo back today)    General Comments General comments (skin integrity, edema, etc.): BP 121/61 supine post-exertion, HR 80's-90's seated, RN notified leads come off easily was not able to assess during short gait trial due to this.      Pertinent Vitals/Pain Pain Assessment Pain Assessment: Faces Faces Pain Scale: Hurts little more Pain Location: L thigh Pain Descriptors / Indicators: Aching Pain Intervention(s): Monitored during session, Repositioned    Home Living                          Prior Function            PT Goals (current goals can now be found in the care plan section) Acute Rehab PT Goals Patient Stated Goal: to improve mobility quality and reduce falls risk PT Goal Formulation: With patient Time For Goal Achievement: 11/18/22 Progress towards PT goals: Progressing toward goals    Frequency    Min 3X/week      PT Plan Current plan remains appropriate    Co-evaluation              AM-PAC PT "6 Clicks" Mobility   Outcome Measure  Help needed turning from your back to your side while in a flat bed without using bedrails?: A Little Help needed moving from lying on your back to sitting on the side of a flat bed without using bedrails?: A Little Help needed moving to and from a bed to a chair (including a wheelchair)?: A Lot Help needed standing up  from a chair using your arms (e.g., wheelchair or bedside chair)?: A Lot Help needed to walk in hospital room?: Total (<65f) Help needed climbing 3-5 steps with a railing? : Total 6 Click Score: 12    End of Session Equipment Utilized During Treatment: Gait belt Activity Tolerance: Patient tolerated treatment well Patient left: in bed;with call bell/phone within reach;with bed alarm set Nurse Communication: Mobility status;Other (comment) (may need lift pad to get to/from chair or toilet if fatigued, but drop arm chair brought to room to try to ensure this will not be necessary; pt wanting supine anyways so no lift pad needed today) PT Visit Diagnosis: Other abnormalities of gait and mobility (R26.89);Muscle weakness (generalized) (M62.81)     Time: 19735-3299PT Time Calculation (min) (ACUTE ONLY): 40 min  Charges:  $Gait Training: 8-22 mins $Therapeutic Exercise: 8-22 mins                     Luka Stohr P., PTA Acute Rehabilitation Services Secure Chat Preferred 9a-5:30pm Office: [place]11/27/2023,  12:59 PM

## 2022-11-09 DIAGNOSIS — L03116 Cellulitis of left lower limb: Secondary | ICD-10-CM | POA: Diagnosis not present

## 2022-11-09 LAB — GLUCOSE, CAPILLARY
Glucose-Capillary: 213 mg/dL — ABNORMAL HIGH (ref 70–99)
Glucose-Capillary: 225 mg/dL — ABNORMAL HIGH (ref 70–99)
Glucose-Capillary: 236 mg/dL — ABNORMAL HIGH (ref 70–99)
Glucose-Capillary: 194 mg/dL — ABNORMAL HIGH (ref 70–99)

## 2022-11-09 LAB — SURGICAL PATHOLOGY

## 2022-11-09 MED ORDER — OXYCODONE HCL 5 MG PO TABS: 5.0000 mg | ORAL_TABLET | Freq: Four times a day (QID) | ORAL | 0 refills | Status: DC | PRN

## 2022-11-09 NOTE — Discharge Summary (Signed)
Physician Discharge Summary  [name] WUJ:[id number] DOB: 01-06-1951 DOA: 10/27/2022  PCP: Sandi Mariscal, MD  Admit date: 10/27/2022 Discharge date: 11/09/2022  Admitted From: ALF Disposition:  SNF  Recommendations for Outpatient Follow-up:  Follow up with PCP in 1-2 weeks  Home Health: none Equipment/Devices: none  Discharge Condition: stable CODE STATUS: Full code Diet Orders (From admission, onward)     Start     Ordered   11/06/22 1037  Diet regular Room service appropriate? Yes; Fluid consistency: Thin  Diet effective now       Question Answer Comment  Room service appropriate? Yes   Fluid consistency: Thin      11/06/22 1036            Brief Narrative / Interim history: 71 y.o. male with PMH significant for obesity, DM2, HTN, HLD, A-fib on Eliquis, colon cancer, chronic anemia BPH, chronic bilateral lower extremity lymphedema with venous stasis ulcers chronically on Lasix, who is a resident at [facility] facility.  Initially sent to ED on 11/15 for left foot erythema, swelling and drainage to [facility] long hospital.  X-ray of the foot showed concerns of possible osteomyelitis, MRI left foot showed chronic osteomyelitis with bony abscess involving fifth metatarsal and pathologic avulsion fracture.  There is also evidence of diffuse cellulitis and myositis.  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  Hospital Course / Discharge diagnoses: Principal Problem:   Cellulitis of left foot Active Problems:   Paroxysmal atrial fibrillation (HCC)   HLD (hyperlipidemia)   DM2 (diabetes mellitus, type 2) (HCC)   BPH (benign prostatic hyperplasia)   Anemia of chronic disease   Moderate protein-calorie malnutrition (HCC)   Osteomyelitis of left foot (HCC)   Principal problem Left foot cellulitis and myositis Chronic osteomyelitis/abscess of fifth metatarsal with associated fracture Septic arthritis of the fifth metatarsal cuboid joint -Presented with worsening left foot  wound symptoms in the setting of diabetes mellitus and chronic lymphedema. X-ray and MRI findings as above showing evidence of cellulitis, myositis, osteomyelitis and septic arthritis.  He was placed on broad-spectrum antibiotics and orthopedic surgery was consulted.  He was taken to the OR on 11/22 and he is status post left BKA. Needs short term rehab   Active problems Chronic bilateral lower extremity lymphedema Chronic venous stasis ulcers -He is on home Lasix 80 mg p.o. daily. Most recent echo from September 2023 with preserved EF. Wound care consult appreciated.  Continue local wound care.  CKD 2 -creatinine stable and at baseline.  Paroxysmal A-fib  -continue Eliquis, does not appear to be on rate controlling agents Type 2 diabetes mellitus, uncontrolled due to hyperglycemia -A1c 5.3 on 09/09/2022. Resume home medications  Hyponatremia-mild, continue to monitor labs as an outpetient Hyperlipidemia -Lipitor History of colon cancer -Status post colon resection in the past. Chronic macrocytic anemia  Chronic vitamin B12 deficiency-Baseline hemoglobin close to 8.  At baseline, no bleeding BPH -Continue Flomax 0.4 mg daily Chronic constipation -Bowel regimen with the scheduled Senokot, as needed MiraLAX Rheumatoid arthritis- resume home medications  Sepsis ruled out   Discharge Instructions  Discharge Instructions     Negative Pressure Wound Therapy - Incisional   Complete by: As directed       Allergies as of 11/09/2022       Reactions   Other Itching, Other (See Comments)   Wynetta [employer] bandage (self-gripping) - erythema, itching        Medication List     STOP taking these  medications    carbamide peroxide 6.5 % OTIC solution Commonly known as: DEBROX   terbinafine 1 % cream Commonly known as: LAMISIL       TAKE these medications    apixaban 5 MG Tabs tablet Commonly known as: ELIQUIS Take 1 tablet (5 mg total) by mouth 2 (two) times daily.    atorvastatin 20 MG tablet Commonly known as: LIPITOR Take 20 mg by mouth daily.   cyanocobalamin 1000 MCG tablet Take 1 tablet (1,000 mcg total) by mouth daily.   dextrose 40 % Gel Commonly known as: GLUTOSE Take 1 Tube by mouth once as needed for low blood sugar. Give Glucose Gel by mouth for BS<70. Repeat CBG in 16 minutes, Repeat Gel & Recheck CBG in 15 minutes. Call MD if Deemed Necessary. Call 911 if Resident is Symptomatic, Not Alert or Unconscious   docusate sodium 100 MG capsule Commonly known as: COLACE Take 1 capsule (100 mg total) by mouth 2 (two) times daily.   folic acid 1 MG tablet Commonly known as: FOLVITE Take 1 mg by mouth See admin instructions. Take 1 mg by mouth at 8 AM daily on Sun/Mon/Tues/Wed/Thurs/Sat ONLY   furosemide 80 MG tablet Commonly known as: LASIX Take 80 mg by mouth daily.   hydrOXYzine 25 MG tablet Commonly known as: ATARAX Take 1 tablet (25 mg total) by mouth every 8 (eight) hours as needed for itching. Use caution as this medication may make you drowsy What changed:  reasons to take this additional instructions   Januvia 100 MG tablet Generic drug: sitaGLIPtin Take 100 mg by mouth daily.   metFORMIN 1000 MG tablet Commonly known as: GLUCOPHAGE Take 1,000 mg by mouth 2 (two) times daily with a meal.   methotrexate 2.5 MG tablet Commonly known as: RHEUMATREX Take 10 mg by mouth every Friday.   NONFORMULARY OR COMPOUNDED ITEM Apply 1 application  topically See admin instructions. Triamcinolone 0.1% cream + Cetaphil cream 1:4 - apply to lower legs once a day   oxyCODONE 5 MG immediate release tablet Commonly known as: Oxy IR/ROXICODONE Take 1-2 tablets (5-10 mg total) by mouth every 6 (six) hours as needed for severe pain. What changed: how much to take   pantoprazole 40 MG tablet Commonly known as: PROTONIX Take 1 tablet (40 mg total) by mouth daily. What changed: when to take this   polyethylene glycol 17 g packet Commonly  known as: MiraLax Take 17 g by mouth daily. Hold for diarrhea What changed:  when to take this additional instructions   potassium chloride SA 20 MEQ tablet Commonly known as: KLOR-CON M Take 20 mEq by mouth daily.   senna-docusate 8.6-50 MG tablet Commonly known as: Senokot-S Take 1 tablet by mouth 2 (two) times daily between meals as needed for moderate constipation.   tamsulosin 0.4 MG Caps capsule Commonly known as: FLOMAX Take 1 capsule (0.4 mg total) by mouth daily.   triamcinolone cream 0.1 % Commonly known as: KENALOG Apply 1 application  topically See admin instructions. Apply to itchy areas 2 times a day and and affected areas of the body as needed for dermatitis   zinc oxide 20 % ointment Apply 1 Application topically See admin instructions. Apply to left calf area 2 times a day        Follow-up Information     Newt Minion, MD Follow up in 1 week(s).   Specialty: Orthopedic Surgery Contact information: [address] [place] [address] [phone]  Consultations: Orthopedic surgery   Procedures/Studies:  MR FOOT LEFT W WO CONTRAST  Result Date: 10/28/2022 CLINICAL DATA:  Open wound and abnormal x-rays suspicious for osteomyelitis. EXAM: MRI OF THE LEFT FOREFOOT WITHOUT AND WITH CONTRAST TECHNIQUE: Multiplanar, multisequence MR imaging of the left foot was performed both before and after administration of intravenous contrast. CONTRAST:  28m GADAVIST GADOBUTROL 1 MMOL/ML IV SOLN COMPARISON:  Radiographs 10/27/2022 FINDINGS: Changes of chronic appearing osteomyelitis with a bone abscess involving the base of the fifth metatarsal with associated pathologic avulsion fracture. Surrounding changes of cellulitis and overlying open wound. No discrete drainable soft tissue abscess. Suspect changes of septic arthritis at the fifth metatarsal articulation with the cuboid and osteomyelitis involving the adjacent cuboid. The other bony  structures are intact. Diffuse cellulitis and myositis.  No findings for pyomyositis. IMPRESSION: 1. Changes of chronic appearing osteomyelitis with a bone abscess involving the base of the fifth metatarsal with associated pathologic avulsion fracture. 2. Suspect changes of septic arthritis at the fifth metatarsal articulation with the cuboid and osteomyelitis involving the adjacent cuboid. 3. Diffuse cellulitis and myositis. No findings for pyomyositis. Electronically Signed   By: PMarijo SanesM.D.   On: 10/28/2022 07:54   DG Foot Complete Left  Result Date: 10/27/2022 CLINICAL DATA:  Open wound and foot pain. EXAM: LEFT FOOT - COMPLETE 3+ VIEW COMPARISON:  None Available. FINDINGS: Large area of destructive bony change involving the base of the fifth metatarsal. Possible pathologic avulsion fracture. The other metatarsals appear intact and the cuboid appears intact. Diffuse subcutaneous soft tissue swelling/edema suggesting cellulitis. No gas is seen in the soft tissues. IMPRESSION: 1. Large area of destructive bony change involving the base of the fifth metatarsal consistent with osteomyelitis. Possible pathologic avulsion fracture. 2. Diffuse subcutaneous soft tissue swelling/edema suggesting cellulitis. Electronically Signed   By: PMarijo SanesM.D.   On: 10/27/2022 14:10    Subjective: - no chest pain, shortness of breath, no abdominal pain, nausea or vomiting.   Discharge Exam: BP (!) 123/51 (BP Location: Right Arm)   Pulse 70   Temp 98.3 F (36.8 C) (Oral)   Resp 16   Ht '6\' 3"'$  (1.905 m)   Wt 129.3 kg   SpO2 96%   BMI 35.63 kg/m   General: Pt is alert, awake, not in acute distress Cardiovascular: RRR, S1/S2 +, no rubs, no gallops Respiratory: CTA bilaterally, no wheezing, no rhonchi Abdominal: Soft, NT, ND, bowel sounds + Extremities: no edema, no cyanosis  The results of significant diagnostics from this hospitalization (including imaging, microbiology, ancillary and laboratory)  are listed below for reference.     Microbiology: No results found for this or any previous visit (from the past 240 hour(s)).   Labs: Basic Metabolic Panel: Recent Labs  Lab 11/03/22 0506 11/04/22 0212 11/05/22 0315 11/06/22 0314 11/08/22 0346  NA 135 132* 133* 133* 130*  K 4.2 4.4 4.0 3.7 3.9  CL 103 97* 97* 95* 95*  CO2 '24 23 24 26 24  '$ GLUCOSE 165* 283* 218* 146* 217*  BUN 23 21 30* 32* 46*  CREATININE 1.15 1.14 1.44* 1.23 1.22  CALCIUM 8.6* 8.7* 8.9 8.9 8.4*  MG 1.8 1.9 1.8 1.9 1.9   Liver Function Tests: Recent Labs  Lab 11/05/22 0315 11/08/22 0346  AST 19 20  ALT 14 16  ALKPHOS 56 56  BILITOT 0.3 0.4  PROT 7.5 7.8  ALBUMIN 2.6* 2.6*   CBC: Recent Labs  Lab 11/03/22 0506 11/04/22 0212 11/05/22 0315 11/06/22  0092 11/08/22 0346  WBC 5.0 5.9 5.9 5.2 6.0  HGB 9.0* 8.3* 8.5* 8.7* 8.6*  HCT 26.9* 26.4* 26.1* 25.6* 26.6*  MCV 105.1* 108.6* 106.5* 103.2* 106.0*  PLT 163 150 147* 156 171   CBG: Recent Labs  Lab 11/08/22 1125 11/08/22 1634 11/08/22 2348 11/09/22 0741 11/09/22 1125  GLUCAP 242* 208* 258* 213* 225*   Hgb A1c No results for input(s): "HGBA1C" in the last 72 hours. Lipid Profile No results for input(s): "CHOL", "HDL", "LDLCALC", "TRIG", "CHOLHDL", "LDLDIRECT" in the last 72 hours. Thyroid function studies No results for input(s): "TSH", "T4TOTAL", "T3FREE", "THYROIDAB" in the last 72 hours.  Invalid input(s): "FREET3" Urinalysis    Component Value Date/Time   COLORURINE YELLOW 09/09/2022 [place] 09/09/2022 1051   LABSPEC 1.008 09/09/2022 1051   PHURINE 6.0 09/09/2022 1051   GLUCOSEU NEGATIVE 09/09/2022 1051   HGBUR NEGATIVE 09/09/2022 1051   BILIRUBINUR NEGATIVE 09/09/2022 1051   BILIRUBINUR NEG 05/27/2013 1516   KETONESUR NEGATIVE 09/09/2022 1051   PROTEINUR NEGATIVE 09/09/2022 1051   UROBILINOGEN 0.2 05/27/2013 1516   NITRITE NEGATIVE 09/09/2022 1051   LEUKOCYTESUR NEGATIVE 09/09/2022 1051    FURTHER  DISCHARGE INSTRUCTIONS:   Get Medicines reviewed and adjusted: Please take all your medications with you for your next visit with your Primary MD   Laboratory/radiological data: Please request your Primary MD to go over all hospital tests and procedure/radiological results at the follow up, please ask your Primary MD to get all Hospital records sent to his/her office.   In some cases, they will be blood work, cultures and biopsy results pending at the time of your discharge. Please request that your primary care M.D. goes through all the records of your hospital data and follows up on these results.   Also Note the following: If you experience worsening of your admission symptoms, develop shortness of breath, life threatening emergency, suicidal or homicidal thoughts you must seek medical attention immediately by calling 911 or calling your MD immediately  if symptoms less severe.   You must read complete instructions/literature along with all the possible adverse reactions/side effects for all the Medicines you take and that have been prescribed to you. Take any new Medicines after you have completely understood and accpet all the possible adverse reactions/side effects.    Do not drive when taking Pain medications or sleeping medications (Benzodaizepines)   Do not take more than prescribed Pain, Sleep and Anxiety Medications. It is not advisable to combine anxiety,sleep and pain medications without talking with your primary care practitioner   Special Instructions: If you have smoked or chewed Tobacco  in the last 2 yrs please stop smoking, stop any regular Alcohol  and or any Recreational drug use.   Wear Seat belts while driving.   Please note: You were cared for by a hospitalist during your hospital stay. Once you are discharged, your primary care physician will handle any further medical issues. Please note that NO REFILLS for any discharge medications will be authorized once you are  discharged, as it is imperative that you return to your primary care physician (or establish a relationship with a primary care physician if you do not have one) for your post hospital discharge needs so that they can reassess your need for medications and monitor your lab values.  Time coordinating discharge: 40 minutes  SIGNED:  Marzetta Board, MD, PhD 11/09/2022, 2:19 PM

## 2022-11-09 NOTE — TOC Progression Note (Addendum)
Transition of Care [facility]) - Progression Note    Patient Details  Name: [name] MRN: [id number] Date of Birth: 1951/01/12  Transition of Care [facility]) CM/SW Contact  Joanne Chars, LCSW Phone Number: 11/09/2022, 10:31 AM  Clinical Narrative:   Bed offers presented to pt along with choice document.  Pt accepts offer at [facility].    CSW spoke with [facility] and confirmed bed.  They will initiate insurance auth request.    PASSR received: [id number]  Expected Discharge Plan: [place] Barriers to Discharge: Continued Medical Work up, SNF Pending bed offer  Expected Discharge Plan and Services Expected Discharge Plan: [place] In-house Referral: Clinical Social Work Discharge Planning Services: CM Consult Post Acute Care Choice: [place] arrangements for the past 2 months: [place] ([facility])                 DME Arranged: N/A DME Agency: NA                   Social Determinants of Health (SDOH) Interventions Transportation Interventions: Intervention Not Indicated  Readmission Risk Interventions    10/28/2022   12:37 PM 10/28/2022    8:36 AM  Readmission Risk Prevention Plan  Transportation Screening Complete Complete  PCP or Specialist Appt within 5-7 Days Complete Complete  Home Care Screening Complete Complete  Medication Review (RN CM) Complete Complete

## 2022-11-10 LAB — COMPREHENSIVE METABOLIC PANEL
ALT: 21 U/L (ref 0–44)
AST: 26 U/L (ref 15–41)
Albumin: 2.4 g/dL — ABNORMAL LOW (ref 3.5–5.0)
Alkaline Phosphatase: 80 U/L (ref 38–126)
Anion gap: 9 (ref 5–15)
BUN: 40 mg/dL — ABNORMAL HIGH (ref 8–23)
CO2: 26 mmol/L (ref 22–32)
Calcium: 8.3 mg/dL — ABNORMAL LOW (ref 8.9–10.3)
Chloride: 94 mmol/L — ABNORMAL LOW (ref 98–111)
Creatinine, Ser: 0.97 mg/dL (ref 0.61–1.24)
GFR, Estimated: >60 mL/min (ref >60)
Glucose, Bld: 208 mg/dL — ABNORMAL HIGH (ref 70–99)
Potassium: 3.9 mmol/L (ref 3.5–5.1)
Sodium: 129 mmol/L — ABNORMAL LOW (ref 135–145)
Total Bilirubin: 0.5 mg/dL (ref 0.3–1.2)
Total Protein: 7.5 g/dL (ref 6.5–8.1)

## 2022-11-10 LAB — CBC
HCT: 24.9 % — ABNORMAL LOW (ref 39.0–52.0)
Hemoglobin: 8 g/dL — ABNORMAL LOW (ref 13.0–17.0)
MCH: 34.2 pg — ABNORMAL HIGH (ref 26.0–34.0)
MCHC: 32.1 g/dL (ref 30.0–36.0)
MCV: 106.4 fL — ABNORMAL HIGH (ref 80.0–100.0)
Platelets: 176 10*3/uL (ref 150–400)
RBC: 2.34 MIL/uL — ABNORMAL LOW (ref 4.22–5.81)
RDW: 17.5 % — ABNORMAL HIGH (ref 11.5–15.5)
WBC: 5.5 10*3/uL (ref 4.0–10.5)
nRBC: 0 % (ref 0.0–0.2)

## 2022-11-10 LAB — MAGNESIUM: Magnesium: 2.1 mg/dL (ref 1.7–2.4)

## 2022-11-10 LAB — GLUCOSE, CAPILLARY
Glucose-Capillary: 235 mg/dL — ABNORMAL HIGH (ref 70–99)
Glucose-Capillary: 237 mg/dL — ABNORMAL HIGH (ref 70–99)
Glucose-Capillary: 173 mg/dL — ABNORMAL HIGH (ref 70–99)
Glucose-Capillary: 217 mg/dL — ABNORMAL HIGH (ref 70–99)

## 2022-11-10 NOTE — Progress Notes (Signed)
Patient d/c'd on 11/28-- please see d/c summary.  Continue to await insurance authorization. JV

## 2022-11-10 NOTE — TOC Progression Note (Signed)
Transition of Care [facility]) - Progression Note    Patient Details  Name: SHAFER SWAMY MRN: [id number] Date of Birth: 25-Apr-1951  Transition of Care [facility]) CM/SW Contact  Joanne Chars, LCSW Phone Number: 11/10/2022, 11:42 AM  Clinical Narrative:   CSW received message from [facility].  Pt Josem Kaufmann is still pending at this time.      Expected Discharge Plan: [place] Barriers to Discharge: Continued Medical Work up, SNF Pending bed offer  Expected Discharge Plan and Services Expected Discharge Plan: [place] In-house Referral: Clinical Social Work Discharge Planning Services: CM Consult Post Acute Care Choice: [place] arrangements for the past 2 months: [place] ([facility]) Expected Discharge Date: 11/09/22               DME Arranged: N/A DME Agency: NA                   Social Determinants of Health ([place]) Interventions Transportation Interventions: Intervention Not Indicated  Readmission Risk Interventions    10/28/2022   12:37 PM 10/28/2022    8:36 AM  Readmission Risk Prevention Plan  Transportation Screening Complete Complete  PCP or Specialist Appt within 5-7 Days Complete Complete  Home Care Screening Complete Complete  Medication Review (RN CM) Complete Complete

## 2022-11-10 NOTE — Care Management Important Message (Signed)
Important Message  Patient Details  Name: [name] MRN: [id number] Date of Birth: October 02, 1951   Medicare Important Message Given:  Yes     Hannah Beat 11/10/2022, 11:00 AM

## 2022-11-10 NOTE — Progress Notes (Signed)
Physical Therapy Treatment Patient Details Name: [name] MRN: [id number] DOB: 28-Dec-1950 Today's Date: 11/10/2022   History of Present Illness 71 y.o. male presents to [facility] hospital on 10/27/2022 with osteomyelitis of L foot. 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 PMH includes obesity, DMII, HTN, HLD, afib, colon cancer, chronic BLE lymphedema.    PT Comments    Pt received in supine, agreeable to therapy session with emphasis on supine and seated LE exercises, UE activation and strengthening with squat and scoot transfers, seated balance tasks and pressure relief/skin protections strategies. Obtained barrier ointment and new bed pads for pt as he was constantly scratching and opening small wounds on his buttocks/hips, pt reports mild relief from barrier ointment. Pt continues to benefit from PT services to progress toward functional mobility goals.    Recommendations for follow up therapy are one component of a multi-disciplinary discharge planning process, led by the attending physician.  Recommendations may be updated based on patient status, additional functional criteria and insurance authorization.  Follow Up Recommendations  Acute inpatient rehab (3hours/day) (will need SNF if AIR denied)     Assistance Recommended at Discharge PRN  Patient can return home with the following A lot of help with walking and/or transfers;A lot of help with bathing/dressing/bathroom;Assistance with cooking/housework;Assist for transportation;Help with stairs or ramp for entrance   Equipment Recommendations   (TBD)    Recommendations for Other Services       Precautions / Restrictions Precautions Precautions: Fall Precaution Comments: wound vac Required Braces or Orthoses: Other Brace Other Brace: Limb protector Restrictions Weight Bearing Restrictions: Yes LLE Weight Bearing: Non weight bearing     Mobility  Bed Mobility Overal bed mobility: Needs Assistance Bed Mobility: Rolling,  Sidelying to Sit, Sit to Sidelying Rolling: Modified independent (Device/Increase time) Sidelying to sit: Min guard     Sit to sidelying: Min guard General bed mobility comments: use of bed features/rails, HOB elevated, increased time    Transfers Overall transfer level: Needs assistance Equipment used: (other) Bed pads          Lateral/Scoot Transfers: Max assist General transfer comment: Practiced squatting from elevated bed height and pushing up with BUE on bed side rail and back of chair vs bed surface, pt unable to fully lift hips off bed with +1 assist via this technique and pt defers to try Stedy due to previous poor experience with this device, so instead worked on anterior weight acceptance through RLE and tricep activation as pre-transfer activity. Pt variable min to maxA for anterior/lateral seated scooting at EOB.    Ambulation/Gait    General Gait Details: defer for safety, pt unable to stand fully from EOB with +1 assist   Stairs             Wheelchair Mobility    Modified Rankin (Stroke Patients Only)       Balance Overall balance assessment: Needs assistance Sitting-balance support: No upper extremity supported, Feet supported Sitting balance-Leahy Scale: Fair Sitting balance - Comments: lateral leans with elbow taps/elbow propping with min guard       Standing balance comment: pt unable with +1 assist today                            Cognition Arousal/Alertness: Awake/alert Behavior During Therapy: Impulsive Overall Cognitive Status: Within Functional Limits for tasks assessed Area of Impairment: Awareness, Safety/judgement  Memory: Decreased short-term memory   Safety/Judgement: Decreased awareness of safety, Decreased awareness of deficits Awareness: Emergent   General Comments: Self-directed, pt resistant to suggestions on mobility modifications.        Exercises Amputee Exercises Quad Sets:  AROM, AAROM, Left, 5 reps, Supine (tactile cues for technique) Towel Squeeze: AROM, 5 reps, Supine Hip Extension: 5 reps, Supine, Left Hip ABduction/ADduction: AROM, 5 reps, Supine Hip Flexion/Marching: AROM, Left, 5 reps, Supine Knee Flexion:  (a couple reps, mostly had limb guard on however) Chair Push Up: AROM, Both, 10 reps, Seated (at EOB; emphasis on BUE with technique, pt unable to assist much with RLE to perform a full squat) Other Exercises Other Exercises: lateral leans with elbow taps x5 reps ea direction    General Comments General comments (skin integrity, edema, etc.): VSS on RA; pt with many areas of excoriation on buttocks and lower back, pt reports this is a chroic issue, PTA assisted him to wipe these areas with cold rag for comfort and barrier ointment applied as he states it is due to sweat/excessive moisture that he scratches at it.      Pertinent Vitals/Pain Pain Assessment Pain Assessment: Faces Faces Pain Scale: Hurts a little bit Pain Location: L thigh Pain Descriptors / Indicators: Aching, Discomfort, Numbness Pain Intervention(s): Limited activity within patient's tolerance, Monitored during session, Repositioned     PT Goals (current goals can now be found in the care plan section) Acute Rehab PT Goals Patient Stated Goal: to improve mobility quality and reduce falls risk PT Goal Formulation: With patient Time For Goal Achievement: 11/18/22 Progress towards PT goals: Progressing toward goals    Frequency    Min 3X/week      PT Plan Current plan remains appropriate       AM-PAC PT "6 Clicks" Mobility   Outcome Measure  Help needed turning from your back to your side while in a flat bed without using bedrails?: A Little Help needed moving from lying on your back to sitting on the side of a flat bed without using bedrails?: A Little Help needed moving to and from a bed to a chair (including a wheelchair)?: A Lot Help needed standing up from a  chair using your arms (e.g., wheelchair or bedside chair)?: A Lot Help needed to walk in hospital room?: Total (<6f) Help needed climbing 3-5 steps with a railing? : Total 6 Click Score: 12    End of Session   Activity Tolerance: Patient tolerated treatment well;Patient limited by fatigue Patient left: in bed;with call bell/phone within reach;with bed alarm set;Other (comment) (new bed pads under him) Nurse Communication: Mobility status;Other (comment) (drop arm chair in room) PT Visit Diagnosis: Other abnormalities of gait and mobility (R26.89);Muscle weakness (generalized) (M62.81)     Time: [address]885-0277PT Time Calculation (min) (ACUTE ONLY): 32 min  Charges:  $Therapeutic Exercise: 8-22 mins $Therapeutic Activity: 8-22 mins                     Leata Dominy P., PTA Acute Rehabilitation Services Secure Chat Preferred 9a-5:30pm Office: [place]/29/2023, 2:50 PM

## 2022-11-10 NOTE — Plan of Care (Signed)
  Problem: Coping: Goal: Ability to adjust to condition or change in health will improve Outcome: Progressing   Problem: Fluid Volume: Goal: Ability to maintain a balanced intake and output will improve Outcome: Progressing   Problem: Health Behavior/Discharge Planning: Goal: Ability to identify and utilize available resources and services will improve Outcome: Progressing Goal: Ability to manage health-related needs will improve Outcome: Progressing   Problem: Nutritional: Goal: Maintenance of adequate nutrition will improve Outcome: Progressing   Problem: Skin Integrity: Goal: Risk for impaired skin integrity will decrease Outcome: Progressing   Problem: Tissue Perfusion: Goal: Adequacy of tissue perfusion will improve Outcome: Progressing

## 2022-11-11 LAB — GLUCOSE, CAPILLARY
Glucose-Capillary: 179 mg/dL — ABNORMAL HIGH (ref 70–99)
Glucose-Capillary: 239 mg/dL — ABNORMAL HIGH (ref 70–99)
Glucose-Capillary: 238 mg/dL — ABNORMAL HIGH (ref 70–99)
Glucose-Capillary: 230 mg/dL — ABNORMAL HIGH (ref 70–99)

## 2022-11-11 NOTE — TOC Progression Note (Addendum)
Transition of Care [facility]) - Progression Note    Patient Details  Name: [name] MRN: [id number] Date of Birth: February 25, 1951  [facility] CM/SW Contact  Joanne Chars, LCSW Phone Number: 11/11/2022, 9:44 AM  Clinical Narrative:   Message from Emily/Linden: auth still pending.  She will talk with her business office and see if they can pursue why there is a delay.  CSW spoke with [facility] Supervisor and she will attempt to contact [employer] as well.    1230: Per TOC supervisor, Holland Falling says they do not have a pending auth.  Only the denial of CIR.  CSW spoke with Emily/Linden place and passed this on.  She will talk with her auth person, Baxter Flattery.   1440: CSW received call from Brianna/[employer].  [phone].  She does not see anything in the [employer] system.  She is working on it but assumes this to be accurate and will start auth now.    Expected Discharge Plan: [place] Barriers to Discharge: Continued Medical Work up, SNF Pending bed offer  Expected Discharge Plan and Services Expected Discharge Plan: [place] In-house Referral: Clinical Social Work Discharge Planning Services: CM Consult Post Acute Care Choice: [place] arrangements for the past 2 months: [place] ([facility]) Expected Discharge Date: 11/09/22               DME Arranged: N/A DME Agency: NA                   Social Determinants of Health ([place]) Interventions Transportation Interventions: Intervention Not Indicated  Readmission Risk Interventions    10/28/2022   12:37 PM 10/28/2022    8:36 AM  Readmission Risk Prevention Plan  Transportation Screening Complete Complete  PCP or Specialist Appt within 5-7 Days Complete Complete  Home Care Screening Complete Complete  Medication Review (RN CM) Complete Complete

## 2022-11-11 NOTE — Progress Notes (Signed)
Continue to await insurance authorization day #3. Patient stable.  No issues. [name] DO

## 2022-11-11 NOTE — Plan of Care (Signed)

## 2022-11-12 LAB — GLUCOSE, CAPILLARY
Glucose-Capillary: 159 mg/dL — ABNORMAL HIGH (ref 70–99)
Glucose-Capillary: 230 mg/dL — ABNORMAL HIGH (ref 70–99)
Glucose-Capillary: 127 mg/dL — ABNORMAL HIGH (ref 70–99)
Glucose-Capillary: 263 mg/dL — ABNORMAL HIGH (ref 70–99)

## 2022-11-12 NOTE — Progress Notes (Addendum)
Physical Therapy Treatment Patient Details Name: [name] MRN: [id number] DOB: 1951-04-12 Today's Date: 11/12/2022   History of Present Illness 71 y.o. male presents to [facility] hospital on 10/27/2022 with osteomyelitis of L foot. 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 PMH includes obesity, DMII, HTN, HLD, afib, colon cancer, chronic BLE lymphedema.    PT Comments    Pt received in supine, anxious due to c/o constipation and reporting unable to use bed pan. RN requesting PTA assist him with transfer to bathroom, pt quick to fatigue so pt agreeable to try stand pivot to [facility] for his safety. Once on [facility], pt reports it is too small and uncomfortable to be able to have BM, pt defers to try bed pan and eager to try ambulation to toilet, chair follow provided for his safety. Pt needing up to +2 modA for transfers from elevated surface and up to +2 maxA for gait safety due to lateral LOB/leans. Pt up on toilet at end of session, pt checked on x3 over the course of 15 mins but insists he needs more time. RN and NT and mobility specialist all notified of safe technique to transfer pt back to bed from toilet (hoyer lift vs stand pivot with RW to chair and then wheeling recliner chair back to his bedside to pivot back. Pt refuses to try Stedy lift when offered. Pt continues to benefit from PT services to progress toward functional mobility goals.    Recommendations for follow up therapy are one component of a multi-disciplinary discharge planning process, led by the attending physician.  Recommendations may be updated based on patient status, additional functional criteria and insurance authorization.  Follow Up Recommendations  Acute inpatient rehab (3hours/day) (will need SNF if AIR denied)     Assistance Recommended at Discharge PRN  Patient can return home with the following A lot of help with walking and/or transfers;A lot of help with bathing/dressing/bathroom;Assistance with cooking/housework;Assist for  transportation;Help with stairs or ramp for entrance   Equipment Recommendations   (TBD)    Recommendations for Other Services       Precautions / Restrictions Precautions Precautions: Fall Precaution Comments: Contact? (no sign on door); wound vac Required Braces or Orthoses: Other Brace Other Brace: Limb protector Restrictions Weight Bearing Restrictions: Yes LLE Weight Bearing: Non weight bearing     Mobility  Bed Mobility Overal bed mobility: Needs Assistance Bed Mobility: Supine to Sit     Supine to sit: Min assist, +2 for physical assistance     General bed mobility comments: use of bed features/rails, HOB elevated, increased time, pt with posterior LOB trying to raise trunk needing minA today    Transfers Overall transfer level: Needs assistance Equipment used: Rolling walker (2 wheels) Transfers: Sit to/from Stand, Bed to chair/wheelchair/BSC Sit to Stand: +2 physical assistance Stand pivot transfers: Mod assist, +2 physical assistance         General transfer comment: from EOB>RW and EOB<>BSC using RW and BSC>RW with consistent modA +1-2 needed, pt impulsive and with lateral LOB at times, also needs assist for line and RW mgmt while turning    Ambulation/Gait Ambulation/Gait assistance: Mod assist, +2 physical assistance, Max assist Gait Distance (Feet): 15 Feet Assistive device: Rolling walker (2 wheels) Gait Pattern/deviations: Step-to pattern ("hop-to")       General Gait Details: close chair follow, modA +2 but occasional maxA when pt taking standing break due to lateral lean     Balance Overall balance assessment: Needs assistance Sitting-balance support: Bilateral  upper extremity supported, Single extremity supported Sitting balance-Leahy Scale: Fair     Standing balance support: Bilateral upper extremity supported, Reliant on assistive device for balance Standing balance-Leahy Scale: Poor Standing balance comment: heavy reliance on RW and  +2 external assist needed for safety                            Cognition Arousal/Alertness: Awake/alert Behavior During Therapy: Impulsive, Anxious Overall Cognitive Status: Within Functional Limits for tasks assessed Area of Impairment: Awareness, Safety/judgement                     Memory: Decreased short-term memory   Safety/Judgement: Decreased awareness of safety, Decreased awareness of deficits Awareness: Emergent   General Comments: Self-directed, pt resistant to suggestions on mobility modifications. Pt having difficulty understanding roles of RN vs PTA and asking PTA nursing type questions, pt needing reminders that these questions are not in our scope of practice. Pt tending to panic when mobilizing and fatiguing and anxious throughout.        Exercises Amputee Exercises Chair Push Up: AROM, Both, Seated, 5 reps (sitting on BSC)    General Comments General comments (skin integrity, edema, etc.): VSS on RA prior to mobility, pt blood sugar elevated ~230 RN aware      Pertinent Vitals/Pain Pain Assessment Pain Assessment: Faces Faces Pain Scale: Hurts even more Pain Location: L thigh when limb guard donned Pain Descriptors / Indicators: Aching, Discomfort, Numbness, Tender, Throbbing (itching (all over)) Pain Intervention(s): Limited activity within patient's tolerance, Monitored during session, Patient requesting pain meds-RN notified, Repositioned     PT Goals (current goals can now be found in the care plan section) Acute Rehab PT Goals Patient Stated Goal: to improve mobility quality and reduce falls risk PT Goal Formulation: With patient Time For Goal Achievement: 11/18/22 Progress towards PT goals: Progressing toward goals    Frequency    Min 3X/week      PT Plan Current plan remains appropriate       AM-PAC PT "6 Clicks" Mobility   Outcome Measure  Help needed turning from your back to your side while in a flat bed without  using bedrails?: A Little Help needed moving from lying on your back to sitting on the side of a flat bed without using bedrails?: A Little Help needed moving to and from a bed to a chair (including a wheelchair)?: A Lot Help needed standing up from a chair using your arms (e.g., wheelchair or bedside chair)?: A Lot Help needed to walk in hospital room?: Total (<84f) Help needed climbing 3-5 steps with a railing? : Total 6 Click Score: 12    End of Session Equipment Utilized During Treatment: Gait belt;Other (comment) (LLE limb guard) Activity Tolerance: Patient limited by pain;Patient tolerated treatment well Patient left: with call bell/phone within reach;Other (comment) (on toilet) Nurse Communication: Mobility status;Other (comment);Need for lift equipment;Patient requests pain meds;Precautions (techniques for return to bed from toilet; pivot to chair with RW vs hoyer lift from toilet) PT Visit Diagnosis: Other abnormalities of gait and mobility (R26.89);Muscle weakness (generalized) (M62.81)     Time: [address]683-4196PT Time Calculation (min) (ACUTE ONLY): 30 min  Charges:  $Gait Training: 8-22 mins $Therapeutic Activity: 8-22 mins                     Aneesha Holloran P., PTA Acute Rehabilitation Services Secure Chat Preferred 9a-5:30pm Office: [phone]  Kara Pacer Cayleb Jarnigan 11/12/2022, 1:41 PM

## 2022-11-12 NOTE — Progress Notes (Signed)
Mobility Specialist Progress Note   11/12/22 1700  Mobility  Activity Transferred from chair to bed  Level of Assistance +2 (takes two people) ([place])  Assistive Device Front wheel walker  Distance Ambulated (ft) 2 ft  LLE Weight Bearing NWB  Activity Response Tolerated well  $Mobility charge 1 Mobility   Pt requesting assistance to get from chair to bed d/t discomfort from chair. Required +2A modA on initial rise from chair but minA to steady and for safety for remainder of transfer. Pt left in bed w/o incident, all needs met, and call bell in reach.   Holland Falling Mobility Specialist Please contact via SecureChat or  Rehab office at [phone]

## 2022-11-12 NOTE — Progress Notes (Signed)
Appears insurance authorization not submitted until 11/30.  Patient continues to remain stable and await SNF placement.  JV

## 2022-11-13 LAB — GLUCOSE, CAPILLARY
Glucose-Capillary: 157 mg/dL — ABNORMAL HIGH (ref 70–99)
Glucose-Capillary: 201 mg/dL — ABNORMAL HIGH (ref 70–99)
Glucose-Capillary: 218 mg/dL — ABNORMAL HIGH (ref 70–99)
Glucose-Capillary: 154 mg/dL — ABNORMAL HIGH (ref 70–99)

## 2022-11-13 MED ORDER — BISACODYL 10 MG RE SUPP: 10.0000 mg | Freq: Every day | RECTAL | Status: DC | PRN

## 2022-11-13 MED ORDER — POLYETHYLENE GLYCOL 3350 17 GM/SCOOP PO POWD
1.0000 | Freq: Once | ORAL | Status: AC
  Administered 2022-11-13: 255 g via ORAL
  Filled 2022-11-13: qty 255

## 2022-11-13 MED ORDER — POLYETHYLENE GLYCOL 3350 17 G PO PACK: 17.0000 g | PACK | Freq: Two times a day (BID) | ORAL | Status: DC

## 2022-11-13 NOTE — Progress Notes (Signed)
Patient with BM yesterday but still c/o feelings of constipation.  Asking for a bowel prep-- ordered.  Also continue to await insurance authorization since last Monday/Tuesday JV

## 2022-11-13 NOTE — Progress Notes (Signed)
Spoke to [place] with [employer] who reports Holli Humbles remains pending at this time.   Wandra Feinstein, MSW, LCSW [phone] (coverage)

## 2022-11-14 LAB — GLUCOSE, CAPILLARY
Glucose-Capillary: 243 mg/dL — ABNORMAL HIGH (ref 70–99)
Glucose-Capillary: 158 mg/dL — ABNORMAL HIGH (ref 70–99)
Glucose-Capillary: 210 mg/dL — ABNORMAL HIGH (ref 70–99)
Glucose-Capillary: 205 mg/dL — ABNORMAL HIGH (ref 70–99)

## 2022-11-14 MED ORDER — POLYETHYLENE GLYCOL 3350 17 G PO PACK
17.0000 g | PACK | Freq: Every day | ORAL | Status: DC
  Administered 2022-11-15 – 2022-11-16 (×2): 17 g via ORAL
  Filled 2022-11-14 (×2): qty 1

## 2022-11-14 MED ORDER — BISACODYL 10 MG RE SUPP: 10.0000 mg | Freq: Every day | RECTAL | 0 refills | Status: DC | PRN

## 2022-11-14 NOTE — Progress Notes (Signed)
No longer feels constipated.  Continue to await insurance authorization.   [name] DO

## 2022-11-14 NOTE — Progress Notes (Signed)
Patient had 5 large BM after drinking a bowel prep. Normal color BM. Patient is not constipated at time.

## 2022-11-15 LAB — GLUCOSE, CAPILLARY
Glucose-Capillary: 154 mg/dL — ABNORMAL HIGH (ref 70–99)
Glucose-Capillary: 212 mg/dL — ABNORMAL HIGH (ref 70–99)
Glucose-Capillary: 192 mg/dL — ABNORMAL HIGH (ref 70–99)
Glucose-Capillary: 236 mg/dL — ABNORMAL HIGH (ref 70–99)

## 2022-11-15 NOTE — TOC Progression Note (Signed)
Transition of Care [facility]) - Progression Note    Patient Details  Name: [name] MRN: [id number] Date of Birth: 10-May-1951  Transition of Care [facility]) CM/SW [place], [place] Phone Number: 11/15/2022, 10:05 AM  Clinical Narrative:     CSW checked with [place] at [facility], states Josem Kaufmann is still pending at this time, she states they are checking with Holland Falling today.   Expected Discharge Plan: [place] Barriers to Discharge: Continued Medical Work up, SNF Pending bed offer  Expected Discharge Plan and Services Expected Discharge Plan: [place] In-house Referral: Clinical Social Work Discharge Planning Services: CM Consult Post Acute Care Choice: [place] arrangements for the past 2 months: [place] ([facility]) Expected Discharge Date: 11/09/22               DME Arranged: N/A DME Agency: NA                   Social Determinants of Health ([place]) Interventions Transportation Interventions: Intervention Not Indicated  Readmission Risk Interventions    10/28/2022   12:37 PM 10/28/2022    8:36 AM  Readmission Risk Prevention Plan  Transportation Screening Complete Complete  PCP or Specialist Appt within 5-7 Days Complete Complete  Home Care Screening Complete Complete  Medication Review (RN CM) Complete Complete

## 2022-11-15 NOTE — Progress Notes (Signed)
Nutrition Follow-up  DOCUMENTATION CODES:   Obesity unspecified  INTERVENTION:  Continue regular diet 1 packet Juven BID, each packet provides 95 calories, 2.5 grams of protein (collagen), and 9.8 grams of carbohydrate (3 grams sugar); also contains 7 grams of L-arginine and L-glutamine, 300 mg vitamin C, 15 mg vitamin E, 1.2 mcg vitamin B-12, 9.5 mg zinc, 200 mg calcium, and 1.5 g  Calcium Beta-hydroxy-Beta-methylbutyrate to support wound healing Vitamin C and Zinc (14 days) for wound healing Prosource Plus TID (100kcal and 15g of protein per packet)  NUTRITION DIAGNOSIS:  Moderate Malnutrition (in the context of acute illness) related to  (increased needs with wound healing) as evidenced by mild fat depletion, mild muscle depletion. - new dx established 12/4  GOAL:  Patient will meet greater than or equal to 90% of their needs - progressing  MONITOR:   PO intake, Supplement acceptance, Labs, I & O's  REASON FOR ASSESSMENT:   Malnutrition Screening Tool    ASSESSMENT:  Pt is a 71yo M with PMH of a-fib, DM, HLD, HTN, anemia, adenocarcinoma of colon, chronic constipation, and BPH who presents with cellulitis of L foot.  Pt resting in bed at the time of assessment. Wound vac in place to left BKA site. Pt reports a great appetite and very complimentary of meals. Also enjoys supplements. Discussed the importance of nutrition with wound healing.  Noted significant scabbing and scratch marks to the bilateral arms and legs. Pt reports that he was told his is possibly allergic to his own sweat. Pt appears overall deconditioned, decreased muscle and fat stores palpated on exam.     Average Meal Intake: 11/16-12/2: 93% intake x 8 recorded meals  Nutritionally Relevant Medications: Scheduled Meds:  PROSource Plus  30 mL Oral TID BM   vitamin C  1,000 mg Oral Daily   atorvastatin  20 mg Oral Daily   docusate sodium  100 mg Oral Daily   insulin aspart  0-9 Units Subcutaneous TID WC    insulin glargine-yfgn  14 Units Subcutaneous Daily   multivitamin  1 tablet Oral QHS   JUVEN  1 packet Oral BID BM   pantoprazole  40 mg Oral Daily   polyethylene glycol  17 g Oral Daily   zinc sulfate  220 mg Oral Daily   Continuous Infusions:  sodium chloride     magnesium sulfate bolus IVPB     PRN Meds: alum & mag hydroxide-simeth, bisacodyl, magnesium citrate, magnesium sulfate bolus IVPB, ondansetron, potassium chloride  Labs Reviewed: CBG ranges from 154-212 mg/dL over the last 24 hours HgbA1c 5.3% (9/28)  NUTRITION - FOCUSED PHYSICAL EXAM: Flowsheet Row Most Recent Value  Orbital Region Mild depletion  Upper Arm Region Mild depletion  Thoracic and Lumbar Region No depletion  Buccal Region Mild depletion  Temple Region Moderate depletion  Clavicle Bone Region No depletion  Clavicle and Acromion Bone Region Mild depletion  Scapular Bone Region No depletion  Dorsal Hand Moderate depletion  Patellar Region Mild depletion  Anterior Thigh Region Mild depletion  Posterior Calf Region Mild depletion  Edema (RD Assessment) Mild  [lower extremities]  Hair Reviewed  Eyes Reviewed  Mouth Reviewed  Skin Reviewed  [small scabs and scratch marks over arms and legs]  Nails Reviewed    Diet Order:   Diet Order             Diet regular Room service appropriate? Yes; Fluid consistency: Thin  Diet effective now  EDUCATION NEEDS:  Education needs have been addressed  Skin:  Skin Assessment: Skin Integrity Issues: Skin Integrity Issues:: Incisions Incisions: Left BKA, wound vac in place  Last BM:  12/3 - type 6  Height:  Ht Readings from Last 1 Encounters:  10/30/22 '6\' 3"'$  (1.905 m)   Weight:  Wt Readings from Last 1 Encounters:  11/12/22 132.4 kg   Adjusted Ideal Body Weight:  83.8 kg (adjusted by 5.9% for BKA)  Adjusted BMI:  Body mass index is 38.8 kg/m.  Estimated Nutritional Needs:  Kcal:  2000-2400kcal Protein:  125-140g Fluid:   >2L   Ranell Patrick, RD, LDN Clinical Dietitian RD pager # available in [facility]  After hours/weekend pager # available in [facility]

## 2022-11-15 NOTE — Progress Notes (Signed)
Patient continues to await insurance authorization.  Recent BM. [name] DO

## 2022-11-15 NOTE — Progress Notes (Signed)
Physical Therapy Treatment Patient Details Name: [name] MRN: [id number] DOB: 1951/08/09 Today's Date: 11/15/2022   History of Present Illness 71 y.o. male adm 10/27/2022 with osteomyelitis of L foot. 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 Increased LOS awaiting insurance authorization for SNF. PMHx: obesity, T2DM, HTN, HLD, Afib, colon CA, chronic BLE lymphedema.    PT Comments    Pt eager to get up and stating desire to get to toilet. Pt able to transition to sitting with min assist but requires +2 max assist to rise due to lack of bil UE strength and power with single RLE. Pt educated for safety concerns for fall particularly need for +2 assist in small bathroom with this deferred to bedpan use EOB and bari BSC obtained with drop arm. Pt instructed for sequence of  lateral scoots with drop arm to recliner or BSC from bed. Pt continues to need cues for limb protector use and total assist to don. Will continue to follow.     Recommendations for follow up therapy are one component of a multi-disciplinary discharge planning process, led by the attending physician.  Recommendations may be updated based on patient status, additional functional criteria and insurance authorization.  Follow Up Recommendations  Skilled nursing-short term rehab (<3 hours/day) Can patient physically be transported by private vehicle: No   Assistance Recommended at Discharge Intermittent Supervision/Assistance  Patient can return home with the following A lot of help with bathing/dressing/bathroom;Assistance with cooking/housework;Assist for transportation;Help with stairs or ramp for entrance;Two people to help with walking and/or transfers   Equipment Recommendations  Wheelchair (measurements PT);BSC/3in1    Recommendations for Other Services       Precautions / Restrictions Precautions Precautions: Fall Precaution Comments: Contact? (no sign on door); wound vac Required Braces or Orthoses: Other Brace Restrictions LLE  Weight Bearing: Non weight bearing     Mobility  Bed Mobility Overal bed mobility: Needs Assistance Bed Mobility: Supine to Sit     Supine to sit: Min guard     General bed mobility comments: minguard with increased effort and reliance on rails to pivot to left side of bed    Transfers Overall transfer level: Needs assistance   Transfers: Sit to/from Stand, Bed to chair/wheelchair/BSC Sit to Stand: Max assist, +2 physical assistance, From elevated surface   Step pivot transfers: Min assist, +2 safety/equipment       General transfer comment: pt required 3 attempts to rise from bed due to heavy posterior lean and lack of power to rise from elevated surface. Pt with incontinent stool and unable to maintain standing 1st trial. 2nd trial rise with sitting on bedpan at EOB to continue BM. Pt stood 3rd trial for pericare and able to take minimal hops once assisted into fully upright to pivot to recliner.    Ambulation/Gait               General Gait Details: did not attempt due to fatigue and posterior lean this session   Stairs             Wheelchair Mobility    Modified Rankin (Stroke Patients Only)       Balance Overall balance assessment: Needs assistance   Sitting balance-Leahy Scale: Fair Sitting balance - Comments: static sitting EOB with guarding   Standing balance support: Bilateral upper extremity supported Standing balance-Leahy Scale: Poor Standing balance comment: heavy reliance on RW and +2 external assist needed for safety  Cognition Arousal/Alertness: Awake/alert Behavior During Therapy: Impulsive Overall Cognitive Status: Impaired/Different from baseline Area of Impairment: Safety/judgement                         Safety/Judgement: Decreased awareness of safety, Decreased awareness of deficits     General Comments: pt wanting to get to toilet however max +2 to stand from elevated  surface and unsafe to attempt hopping to toilet        Exercises Amputee Exercises Hip ABduction/ADduction: AROM, Left, Supine, 10 reps Straight Leg Raises: AROM, Left, Supine, 10 reps    General Comments        Pertinent Vitals/Pain Pain Assessment Pain Score: 4  Pain Location: left residual limb with limb protector and thigh with use of bed pan Pain Descriptors / Indicators: Aching, Sore Pain Intervention(s): Limited activity within patient's tolerance, Repositioned, Monitored during session    Home Living                          Prior Function            PT Goals (current goals can now be found in the care plan section) Acute Rehab PT Goals Time For Goal Achievement: 11/29/22 Potential to Achieve Goals: Fair Progress towards PT goals: Progressing toward goals    Frequency    Min 3X/week      PT Plan Discharge plan needs to be updated    Co-evaluation              AM-PAC PT "6 Clicks" Mobility   Outcome Measure  Help needed turning from your back to your side while in a flat bed without using bedrails?: A Little Help needed moving from lying on your back to sitting on the side of a flat bed without using bedrails?: A Little Help needed moving to and from a bed to a chair (including a wheelchair)?: Total Help needed standing up from a chair using your arms (e.g., wheelchair or bedside chair)?: Total Help needed to walk in hospital room?: Total Help needed climbing 3-5 steps with a railing? : Total 6 Click Score: 10    End of Session Equipment Utilized During Treatment: Gait belt;Other (comment) (limb protector) Activity Tolerance: Patient tolerated treatment well Patient left: in chair;with call bell/phone within reach (no alarm set as no alarm pads on unit, RN made aware) Nurse Communication: Mobility status;Other (comment);Precautions (need for lateral scoot with drop arm from recliner to bed or BSC) PT Visit Diagnosis: Other  abnormalities of gait and mobility (R26.89);Muscle weakness (generalized) (M62.81)     Time: 7681-1572 PT Time Calculation (min) (ACUTE ONLY): 33 min  Charges:  $Therapeutic Activity: 23-37 mins                     Bayard Males, PT Acute Rehabilitation Services Office: [phone]    Sandy Salaam Jaleal Schliep 11/15/2022, 9:42 AM

## 2022-11-15 NOTE — Progress Notes (Signed)
OT Cancellation Note  Patient Details Name: FELICIA BOTH MRN: [id number] DOB: [birth year]   Cancelled Treatment:    Reason Eval/Treat Not Completed: Patient declined, no reason specified (Patient asked COTA to return after he has had breakfast.  Will attempt later today as schedule permits) Lodema Hong, [place] 11/15/2022, 7:55 AM

## 2022-11-15 NOTE — Progress Notes (Signed)
Occupational Therapy Treatment Patient Details Name: [name] MRN: [id number] DOB: 11/10/1951 Today's Date: 11/15/2022   History of present illness 71 y.o. male adm 10/27/2022 with osteomyelitis of L foot. 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 Increased LOS awaiting insurance authorization for SNF. PMHx: obesity, T2DM, HTN, HLD, Afib, colon CA, chronic BLE lymphedema.   OT comments  Patient up in recliner upon entry and asking to return to bed. Patient instructed on lateral scoot transfer to left from drop arm recliner and max assist to perform and increased time. Patient able to maintain sitting balance on EOB and was min guard to return to supine. Patient instructed in BUE HEP while in bed with HOB raised. Patient's discharge recommendations changed to SNF from [facility] due to not approved for CIR. Acute OT to continue to follow.    Recommendations for follow up therapy are one component of a multi-disciplinary discharge planning process, led by the attending physician.  Recommendations may be updated based on patient status, additional functional criteria and insurance authorization.    Follow Up Recommendations  Skilled nursing-short term rehab (<3 hours/day)     Assistance Recommended at Discharge Frequent or constant Supervision/Assistance  Patient can return home with the following  A lot of help with bathing/dressing/bathroom;Assist for transportation;A lot of help with walking and/or transfers;Help with stairs or ramp for entrance   Equipment Recommendations  Wheelchair (measurements OT);Wheelchair cushion (measurements OT)    Recommendations for Other Services      Precautions / Restrictions Precautions Precautions: Fall Precaution Comments: Contact? (no sign on door); wound vac Required Braces or Orthoses: Other Brace Other Brace: Limb protector Restrictions Weight Bearing Restrictions: Yes LLE Weight Bearing: Non weight bearing       Mobility Bed Mobility Overal bed mobility: Needs  Assistance Bed Mobility: Sit to Supine       Sit to supine: Min guard   General bed mobility comments: increased time and min guard    Transfers Overall transfer level: Needs assistance Equipment used: None Transfers: Bed to chair/wheelchair/BSC            Lateral/Scoot Transfers: Max assist General transfer comment: lateral scoot transfer from drop arm recliner to EOB going to left     Balance Overall balance assessment: Needs assistance Sitting-balance support: Bilateral upper extremity supported, Single extremity supported Sitting balance-Leahy Scale: Fair Sitting balance - Comments: able to maintain sitting balance on EOB                                   ADL either performed or assessed with clinical judgement   ADL Overall ADL's : Needs assistance/impaired                                       General ADL Comments: focused on transfer to EOB and UE HEP    Extremity/Trunk Assessment              Vision       Perception     Praxis      Cognition Arousal/Alertness: Awake/alert Behavior During Therapy: Impulsive Overall Cognitive Status: Impaired/Different from baseline Area of Impairment: Safety/judgement                     Memory: Decreased short-term memory   Safety/Judgement: Decreased awareness of safety, Decreased awareness of deficits Awareness: Emergent  General Comments: unable to recall therapist from previous visit, repeated self        Exercises Exercises: General Upper Extremity General Exercises - Upper Extremity Shoulder Flexion: Strengthening, Both, 10 reps, Theraband Theraband Level (Shoulder Flexion): Level 1 (Yellow) Shoulder ABduction: Strengthening, Both, 10 reps, Theraband Theraband Level (Shoulder Abduction): Level 1 (Yellow) Elbow Flexion: Strengthening, Both, 10 reps, Theraband Theraband Level (Elbow Flexion): Level 1 (Yellow)    Shoulder Instructions       General  Comments      Pertinent Vitals/ Pain       Pain Assessment Pain Assessment: Faces Faces Pain Scale: Hurts little more Pain Location: left residual limb when on EOB Pain Descriptors / Indicators: Aching, Sore Pain Intervention(s): Limited activity within patient's tolerance, Monitored during session, Repositioned  Home Living                                          Prior Functioning/Environment              Frequency  Min 2X/week        Progress Toward Goals  OT Goals(current goals can now be found in the care plan section)  Progress towards OT goals: Progressing toward goals  Acute Rehab OT Goals Patient Stated Goal: get better OT Goal Formulation: With patient Time For Goal Achievement: 11/18/22 Potential to Achieve Goals: Good ADL Goals Pt Will Perform Grooming: with supervision;standing;sitting Pt Will Perform Lower Body Bathing: with supervision;sit to/from stand Pt Will Perform Lower Body Dressing: with supervision;sit to/from stand Pt Will Transfer to Toilet: with supervision;ambulating;regular height toilet Pt/caregiver will Perform Home Exercise Program: Increased strength;Both right and left upper extremity;With theraband;With Supervision;With written HEP provided  Plan Discharge plan remains appropriate    Co-evaluation                 AM-PAC OT "6 Clicks" Daily Activity     Outcome Measure   Help from another person eating meals?: None Help from another person taking care of personal grooming?: None Help from another person toileting, which includes using toliet, bedpan, or urinal?: A Lot Help from another person bathing (including washing, rinsing, drying)?: A Lot Help from another person to put on and taking off regular upper body clothing?: None Help from another person to put on and taking off regular lower body clothing?: A Lot 6 Click Score: 18    End of Session Equipment Utilized During Treatment: Other (comment)  (drop arm recliner)  OT Visit Diagnosis: Unsteadiness on feet (R26.81);Pain Pain - Right/Left: Left Pain - part of body: Leg   Activity Tolerance Patient tolerated treatment well   Patient Left in bed;with call bell/phone within reach;with bed alarm set   Nurse Communication Mobility status        Time: 9390-3009 OT Time Calculation (min): 30 min  Charges: OT General Charges $OT Visit: 1 Visit OT Treatments $Therapeutic Activity: 8-22 mins $Therapeutic Exercise: 8-22 mins  Lodema Hong, [place]  Office [phone]   Trixie Dredge 11/15/2022, 12:30 PM

## 2022-11-16 DIAGNOSIS — L03116 Cellulitis of left lower limb: Secondary | ICD-10-CM | POA: Diagnosis not present

## 2022-11-16 LAB — GLUCOSE, CAPILLARY
Glucose-Capillary: 150 mg/dL — ABNORMAL HIGH (ref 70–99)
Glucose-Capillary: 189 mg/dL — ABNORMAL HIGH (ref 70–99)
Glucose-Capillary: 148 mg/dL — ABNORMAL HIGH (ref 70–99)

## 2022-11-16 MED ORDER — EYE WASH OP SOLN
1.0000 [drp] | OPHTHALMIC | Status: DC | PRN
  Administered 2022-11-16: 1 [drp] via OPHTHALMIC
  Filled 2022-11-16: qty 118

## 2022-11-16 MED ORDER — EYE WASH OP SOLN: 1.0000 [drp] | OPHTHALMIC | 0 refills | Status: DC | PRN

## 2022-11-16 NOTE — Discharge Summary (Addendum)
Physician Discharge Summary  [name] NID:[id number] DOB: 11-Feb-1951 DOA: 10/27/2022  PCP: Sandi Mariscal, MD  Admit date: 10/27/2022 Discharge date: 11/16/2022  Admitted From: ALF Disposition:  SNF  Recommendations for Outpatient Follow-up:  Follow up with PCP in 1-2 weeks May need to change to carb mod if blood sugars elevated Strict bowel regimen-- does well with bowel preps PRN  Apparently authorization has been in since 11/30 but SNF did not notify anyone  Discharge Condition: stable CODE STATUS: Full code Diet Orders (From admission, onward)     Start     Ordered   11/06/22 1037  Diet regular Room service appropriate? Yes; Fluid consistency: Thin  Diet effective now       Question Answer Comment  Room service appropriate? Yes   Fluid consistency: Thin      11/06/22 1036            Brief Narrative / Interim history: 71 y.o. male with PMH significant for obesity, DM2, HTN, HLD, A-fib on Eliquis, colon cancer, chronic anemia BPH, chronic bilateral lower extremity lymphedema with venous stasis ulcers chronically on Lasix, who is a resident at [facility] facility.  Initially sent to ED on 11/15 for left foot erythema, swelling and drainage to [facility] long hospital.  X-ray of the foot showed concerns of possible osteomyelitis, MRI left foot showed chronic osteomyelitis with bony abscess involving fifth metatarsal and pathologic avulsion fracture.  There is also evidence of diffuse cellulitis and myositis.  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  Hospital Course / Discharge diagnoses: Principal Problem:   Cellulitis of left foot Active Problems:   Paroxysmal atrial fibrillation (HCC)   HLD (hyperlipidemia)   DM2 (diabetes mellitus, type 2) (HCC)   BPH (benign prostatic hyperplasia)   Anemia of chronic disease   Moderate protein-calorie malnutrition (HCC)   Osteomyelitis of left foot (HCC)   Principal problem Left foot cellulitis and myositis Chronic  osteomyelitis/abscess of fifth metatarsal with associated fracture Septic arthritis of the fifth metatarsal cuboid joint -Presented with worsening left foot wound symptoms in the setting of diabetes mellitus and chronic lymphedema. X-ray and MRI findings as above showing evidence of cellulitis, myositis, osteomyelitis and septic arthritis.  He was placed on broad-spectrum antibiotics and orthopedic surgery was consulted.  He was taken to the OR on 11/22 and he is status post left BKA. Needs short term rehab   Active problems Chronic bilateral lower extremity lymphedema Chronic venous stasis ulcers -He is on home Lasix 80 mg p.o. daily. Most recent echo from September 2023 with preserved EF. Wound care consult appreciated.  Continue local wound care.  CKD 2 -creatinine stable and at baseline.  Paroxysmal A-fib  -continue Eliquis, does not appear to be on rate controlling agents Type 2 diabetes mellitus, uncontrolled due to hyperglycemia -A1c 5.3 on 09/09/2022. Resume home medications  Hyponatremia-mild, continue to monitor labs as an outpetient Hyperlipidemia -Lipitor History of colon cancer -Status post colon resection in the past. Chronic macrocytic anemia  Chronic vitamin B12 deficiency-Baseline hemoglobin close to 8.  At baseline, no bleeding BPH -Continue Flomax 0.4 mg daily Chronic constipation -Bowel regimen with the scheduled Senokot, as needed MiraLAX Rheumatoid arthritis- resume home medications  Sepsis ruled out   Discharge Instructions  Discharge Instructions     Negative Pressure Wound Therapy - Incisional   Complete by: As directed       Allergies as of 11/16/2022       Reactions   Other Itching, Other (  See Comments)   Wynetta [employer] bandage (self-gripping) - erythema, itching        Medication List     STOP taking these medications    carbamide peroxide 6.5 % OTIC solution Commonly known as: DEBROX   terbinafine 1 % cream Commonly known as: LAMISIL        TAKE these medications    apixaban 5 MG Tabs tablet Commonly known as: ELIQUIS Take 1 tablet (5 mg total) by mouth 2 (two) times daily.   atorvastatin 20 MG tablet Commonly known as: LIPITOR Take 20 mg by mouth daily.   bisacodyl 10 MG suppository Commonly known as: DULCOLAX Place 1 suppository (10 mg total) rectally daily as needed for moderate constipation.   cyanocobalamin 1000 MCG tablet Take 1 tablet (1,000 mcg total) by mouth daily.   dextrose 40 % Gel Commonly known as: GLUTOSE Take 1 Tube by mouth once as needed for low blood sugar. Give Glucose Gel by mouth for BS<70. Repeat CBG in 16 minutes, Repeat Gel & Recheck CBG in 15 minutes. Call MD if Deemed Necessary. Call 911 if Resident is Symptomatic, Not Alert or Unconscious   docusate sodium 100 MG capsule Commonly known as: COLACE Take 1 capsule (100 mg total) by mouth 2 (two) times daily.   eye wash Soln Place 1 drop into both eyes as needed for dry eyes.   folic acid 1 MG tablet Commonly known as: FOLVITE Take 1 mg by mouth See admin instructions. Take 1 mg by mouth at 8 AM daily on Sun/Mon/Tues/Wed/Thurs/Sat ONLY   furosemide 80 MG tablet Commonly known as: LASIX Take 80 mg by mouth daily.   hydrOXYzine 25 MG tablet Commonly known as: ATARAX Take 1 tablet (25 mg total) by mouth every 8 (eight) hours as needed for itching. Use caution as this medication may make you drowsy What changed:  reasons to take this additional instructions   Januvia 100 MG tablet Generic drug: sitaGLIPtin Take 100 mg by mouth daily.   metFORMIN 1000 MG tablet Commonly known as: GLUCOPHAGE Take 1,000 mg by mouth 2 (two) times daily with a meal.   methotrexate 2.5 MG tablet Commonly known as: RHEUMATREX Take 10 mg by mouth every Friday.   NONFORMULARY OR COMPOUNDED ITEM Apply 1 application  topically See admin instructions. Triamcinolone 0.1% cream + Cetaphil cream 1:4 - apply to lower legs once a day   oxyCODONE 5  MG immediate release tablet Commonly known as: Oxy IR/ROXICODONE Take 1-2 tablets (5-10 mg total) by mouth every 6 (six) hours as needed for severe pain. What changed: how much to take   pantoprazole 40 MG tablet Commonly known as: PROTONIX Take 1 tablet (40 mg total) by mouth daily. What changed: when to take this   polyethylene glycol 17 g packet Commonly known as: MiraLax Take 17 g by mouth daily. Hold for diarrhea What changed:  when to take this additional instructions   potassium chloride SA 20 MEQ tablet Commonly known as: KLOR-CON M Take 20 mEq by mouth daily.   senna-docusate 8.6-50 MG tablet Commonly known as: Senokot-S Take 1 tablet by mouth 2 (two) times daily between meals as needed for moderate constipation.   tamsulosin 0.4 MG Caps capsule Commonly known as: FLOMAX Take 1 capsule (0.4 mg total) by mouth daily.   triamcinolone cream 0.1 % Commonly known as: KENALOG Apply 1 application  topically See admin instructions. Apply to itchy areas 2 times a day and and affected areas of the body as  needed for dermatitis       ASK your doctor about these medications    zinc oxide 20 % ointment Apply 1 Application topically See admin instructions. Apply to left calf area 2 times a day Ask about: Should I take this medication?         Follow-up Information     Newt Minion, MD Follow up in 1 week(s).   Specialty: Orthopedic Surgery Contact information: [address] [place] [address] [phone]                 Consultations: Orthopedic surgery   Procedures/Studies:  MR FOOT LEFT W WO CONTRAST  Result Date: 10/28/2022 CLINICAL DATA:  Open wound and abnormal x-rays suspicious for osteomyelitis. EXAM: MRI OF THE LEFT FOREFOOT WITHOUT AND WITH CONTRAST TECHNIQUE: Multiplanar, multisequence MR imaging of the left foot was performed both before and after administration of intravenous contrast. CONTRAST:  11m GADAVIST GADOBUTROL 1 MMOL/ML  IV SOLN COMPARISON:  Radiographs 10/27/2022 FINDINGS: Changes of chronic appearing osteomyelitis with a bone abscess involving the base of the fifth metatarsal with associated pathologic avulsion fracture. Surrounding changes of cellulitis and overlying open wound. No discrete drainable soft tissue abscess. Suspect changes of septic arthritis at the fifth metatarsal articulation with the cuboid and osteomyelitis involving the adjacent cuboid. The other bony structures are intact. Diffuse cellulitis and myositis.  No findings for pyomyositis. IMPRESSION: 1. Changes of chronic appearing osteomyelitis with a bone abscess involving the base of the fifth metatarsal with associated pathologic avulsion fracture. 2. Suspect changes of septic arthritis at the fifth metatarsal articulation with the cuboid and osteomyelitis involving the adjacent cuboid. 3. Diffuse cellulitis and myositis. No findings for pyomyositis. Electronically Signed   By: PMarijo SanesM.D.   On: 10/28/2022 07:54   DG Foot Complete Left  Result Date: 10/27/2022 CLINICAL DATA:  Open wound and foot pain. EXAM: LEFT FOOT - COMPLETE 3+ VIEW COMPARISON:  None Available. FINDINGS: Large area of destructive bony change involving the base of the fifth metatarsal. Possible pathologic avulsion fracture. The other metatarsals appear intact and the cuboid appears intact. Diffuse subcutaneous soft tissue swelling/edema suggesting cellulitis. No gas is seen in the soft tissues. IMPRESSION: 1. Large area of destructive bony change involving the base of the fifth metatarsal consistent with osteomyelitis. Possible pathologic avulsion fracture. 2. Diffuse subcutaneous soft tissue swelling/edema suggesting cellulitis. Electronically Signed   By: PMarijo SanesM.D.   On: 10/27/2022 14:10    Subjective: - no chest pain, shortness of breath, no abdominal pain, nausea or vomiting.   Discharge Exam: BP 120/68 (BP Location: Left Arm)   Pulse 68   Temp 97.8 F (36.6  C) (Oral)   Resp 18   Ht '6\' 3"'$  (1.905 m)   Wt 132.4 kg   SpO2 95%   BMI 36.48 kg/m   General: Pt is alert, awake, not in acute distress Cardiovascular: RRR, S1/S2 +, no rubs, no gallops Respiratory: CTA bilaterally, no wheezing, no rhonchi Abdominal: Soft, NT, ND, bowel sounds + Extremities: no edema, no cyanosis  The results of significant diagnostics from this hospitalization (including imaging, microbiology, ancillary and laboratory) are listed below for reference.     Microbiology: No results found for this or any previous visit (from the past 240 hour(s)).   Labs: Basic Metabolic Panel: Recent Labs  Lab 11/10/22 0516  NA 129*  K 3.9  CL 94*  CO2 26  GLUCOSE 208*  BUN 40*  CREATININE 0.97  CALCIUM 8.3*  MG 2.1   Liver Function Tests: Recent Labs  Lab 11/10/22 0516  AST 26  ALT 21  ALKPHOS 80  BILITOT 0.5  PROT 7.5  ALBUMIN 2.4*   CBC: Recent Labs  Lab 11/10/22 0516  WBC 5.5  HGB 8.0*  HCT 24.9*  MCV 106.4*  PLT 176   CBG: Recent Labs  Lab 11/15/22 1117 11/15/22 1539 11/15/22 2210 11/16/22 0733 11/16/22 1134  GLUCAP 212* 192* 236* 150* 189*   Hgb A1c No results for input(s): "HGBA1C" in the last 72 hours. Lipid Profile No results for input(s): "CHOL", "HDL", "LDLCALC", "TRIG", "CHOLHDL", "LDLDIRECT" in the last 72 hours. Thyroid function studies No results for input(s): "TSH", "T4TOTAL", "T3FREE", "THYROIDAB" in the last 72 hours.  Invalid input(s): "FREET3" Urinalysis    Component Value Date/Time   COLORURINE YELLOW 09/09/2022 [place] 09/09/2022 1051   LABSPEC 1.008 09/09/2022 1051   PHURINE 6.0 09/09/2022 1051   GLUCOSEU NEGATIVE 09/09/2022 1051   HGBUR NEGATIVE 09/09/2022 1051   BILIRUBINUR NEGATIVE 09/09/2022 1051   BILIRUBINUR NEG 05/27/2013 1516   KETONESUR NEGATIVE 09/09/2022 1051   PROTEINUR NEGATIVE 09/09/2022 1051   UROBILINOGEN 0.2 05/27/2013 1516   NITRITE NEGATIVE 09/09/2022 1051   LEUKOCYTESUR  NEGATIVE 09/09/2022 1051    FURTHER DISCHARGE INSTRUCTIONS:   Get Medicines reviewed and adjusted: Please take all your medications with you for your next visit with your Primary MD   Laboratory/radiological data: Please request your Primary MD to go over all hospital tests and procedure/radiological results at the follow up, please ask your Primary MD to get all Hospital records sent to his/her office.   In some cases, they will be blood work, cultures and biopsy results pending at the time of your discharge. Please request that your primary care M.D. goes through all the records of your hospital data and follows up on these results.   Also Note the following: If you experience worsening of your admission symptoms, develop shortness of breath, life threatening emergency, suicidal or homicidal thoughts you must seek medical attention immediately by calling 911 or calling your MD immediately  if symptoms less severe.   You must read complete instructions/literature along with all the possible adverse reactions/side effects for all the Medicines you take and that have been prescribed to you. Take any new Medicines after you have completely understood and accpet all the possible adverse reactions/side effects.    Do not drive when taking Pain medications or sleeping medications (Benzodaizepines)   Do not take more than prescribed Pain, Sleep and Anxiety Medications. It is not advisable to combine anxiety,sleep and pain medications without talking with your primary care practitioner   Special Instructions: If you have smoked or chewed Tobacco  in the last 2 yrs please stop smoking, stop any regular Alcohol  and or any Recreational drug use.   Wear Seat belts while driving.   Please note: You were cared for by a hospitalist during your hospital stay. Once you are discharged, your primary care physician will handle any further medical issues. Please note that NO REFILLS for any discharge  medications will be authorized once you are discharged, as it is imperative that you return to your primary care physician (or establish a relationship with a primary care physician if you do not have one) for your post hospital discharge needs so that they can reassess your need for medications and monitor your lab values.  Time coordinating discharge: 40 minutes  SIGNED:  Geradine Girt, DO  11/16/2022, 2:46 PM

## 2022-11-16 NOTE — Care Management Important Message (Signed)
Important Message  Patient Details  Name: [name] MRN: [id number] Date of Birth: 1951/11/30   Medicare Important Message Given:  Yes     Orbie Pyo 11/16/2022, 3:38 PM

## 2022-11-16 NOTE — TOC Progression Note (Addendum)
Transition of Care [facility]) - Progression Note    Patient Details  Name: [name] MRN: [id number] Date of Birth: [birth year]  Transition of Care [facility]) CM/SW Boyd, RN Phone Number: 11/16/2022, 12:01 PM  Clinical Narrative:    CM called and spoke with Loree Fee, CM in admissions at [facility] and insurance authorization is still pending at this time.  [facility] will follow up once insurance Josem Kaufmann is complete or if additional clinicals are requested by insurance provider.  CM met with the patient at the bedside and patient was updated regarding pending insurance authorization.  Prevena wound vac present in the room.    The patient states that he is constipated and is requesting medications.  Bedside nursing and MD notified.  11/16/2022 1445 - I spoke with Dr. Eliseo Squires and she asked that I called [employer] to check on the status of the pending insurance authorization.  I spoke with the representative with [facility] and the insurance authorization has been approved from 11/30- 11/17/22.  I called and spoke with Whitney, Admission director and she states to schedule [facility] and transfer the patient to the facility.  [facility] was called for transportation to the facility.  I will ask bedside nurse to transfer the patient's wound vac to a Prevena Vac as ordered by Dr. Sharol Given.  Discharge summary and transfer report were uploaded in the hub for the facility.  Patient was updated.  PASRR is in date - #[id number]   Expected Discharge Plan: [place] Barriers to Discharge: Continued Medical Work up, SNF Pending bed offer  Expected Discharge Plan and Services Expected Discharge Plan: [place] In-house Referral: Clinical Social Work Discharge Planning Services: [facility] Acute Care Choice: [place] arrangements for the past 2 months: [place] ([place]) Expected Discharge Date: 11/09/22               DME  Arranged: N/A DME Agency: NA                   Social Determinants of Health ([place]) Interventions Transportation Interventions: Intervention Not Indicated  Readmission Risk Interventions    10/28/2022   12:37 PM 10/28/2022    8:36 AM  Readmission Risk Prevention Plan  Transportation Screening Complete Complete  PCP or Specialist Appt within 5-7 Days Complete Complete  Home Care Screening Complete Complete  Medication Review (RN CM) Complete Complete

## 2022-11-16 NOTE — Progress Notes (Signed)
PROGRESS NOTE  [name] EHM:[id number] DOB: 1951-03-21 DOA: 10/27/2022 PCP: Sandi Mariscal, MD   LOS: 20 days   Brief Narrative / Interim history: 71 y.o. male with PMH significant for obesity, DM2, HTN, HLD, A-fib on Eliquis, colon cancer, chronic anemia BPH, chronic bilateral lower extremity lymphedema with venous stasis ulcers chronically on Lasix, who is a resident at [facility] facility.  Initially sent to ED on 11/15 for left foot erythema, swelling and drainage to [facility] long hospital.  X-ray of the foot showed concerns of possible osteomyelitis, MRI left foot showed chronic osteomyelitis with bony abscess involving fifth metatarsal and pathologic avulsion fracture.  There is also evidence of diffuse cellulitis and myositis.  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  Patient has been discharged since last Tuesday.  Unfortunately the rehab did not start the authorization when they said they did.  Continue to await insurance auth day #7.  Subjective / 24h Interval events: C/o dry eyes  Assesement and Plan: Principal Problem:   Cellulitis of left foot Active Problems:   Paroxysmal atrial fibrillation (HCC)   HLD (hyperlipidemia)   DM2 (diabetes mellitus, type 2) (HCC)   BPH (benign prostatic hyperplasia)   Anemia of chronic disease   Moderate protein-calorie malnutrition (HCC)   Osteomyelitis of left foot (HCC)    Left foot cellulitis and myositis Chronic osteomyelitis/abscess of fifth metatarsal with associated fracture Septic arthritis of the fifth metatarsal cuboid joint -Presented with worsening left foot wound symptoms in the setting of diabetes mellitus and chronic lymphedema. X-ray and MRI findings as above showing evidence of cellulitis, myositis, osteomyelitis and septic arthritis.  He was placed on broad-spectrum antibiotics and orthopedic surgery was consulted.  He was taken to the OR on 11/22 and he is status post left BKA.  -PT recommends CIR but denied-- await  SNF  Active problems Chronic bilateral lower extremity lymphedema Chronic venous stasis ulcers -He is on home Lasix 80 mg p.o. daily. Most recent echo from September 2023 with preserved EF. Wound care consult appreciated.  Continue local wound care.    CKD 2 -creatinine stable and at baseline  Paroxysmal A-fib  -continue Eliquis, does not appear to be on rate controlling agents   Type 2 diabetes mellitus, uncontrolled due to hyperglycemia -A1c 5.3 on 09/09/2022.     Hyperlipidemia -Lipitor   History of colon cancer -Status post colon resection in the past. -bowel regimen   Chronic macrocytic anemia  Chronic vitamin B12 deficiency-Baseline hemoglobin close to 8.  At baseline, no bleeding   BPH -Continue Flomax 0.4 mg daily   Chronic constipation -Bowel regimen with the scheduled Senokot, as needed MiraLAX   ?? Rheumatoid arthritis- Home medication on hold  Scheduled Meds:  (feeding supplement) PROSource Plus  30 mL Oral TID BM   apixaban  5 mg Oral BID   vitamin C  1,000 mg Oral Daily   atorvastatin  20 mg Oral Daily   docusate sodium  100 mg Oral Daily   insulin aspart  0-9 Units Subcutaneous TID WC   insulin glargine-yfgn  14 Units Subcutaneous Daily   multivitamin  1 tablet Oral QHS   nutrition supplement (JUVEN)  1 packet Oral BID BM   pantoprazole  40 mg Oral Daily   polyethylene glycol  17 g Oral Daily   tamsulosin  0.4 mg Oral Daily   zinc sulfate  220 mg Oral Daily   Continuous Infusions:  sodium chloride     magnesium sulfate bolus IVPB  PRN Meds:.acetaminophen, alum & mag hydroxide-simeth, bisacodyl, bisacodyl, eye wash, fentaNYL (SUBLIMAZE) injection, guaiFENesin-dextromethorphan, hydrALAZINE, hydrALAZINE, HYDROmorphone (DILAUDID) injection, ipratropium-albuterol, labetalol, magnesium citrate, magnesium sulfate bolus IVPB, metoprolol tartrate, metoprolol tartrate, naLOXone (NARCAN)  injection, ondansetron (ZOFRAN) IV, oxyCODONE, oxyCODONE, phenol, potassium  chloride, traZODone  Current Outpatient Medications  Medication Instructions   apixaban (ELIQUIS) 5 mg, Oral, 2 times daily   atorvastatin (LIPITOR) 20 mg, Oral, Daily   bisacodyl (DULCOLAX) 10 mg, Rectal, Daily PRN   carbamide peroxide (DEBROX) 6.5 % OTIC solution 5 drops, Right EAR, 2 times daily   cyanocobalamin 1,000 mcg, Oral, Daily   dextrose (GLUTOSE) 40 % GEL 1 Tube, Oral, Once PRN, Give Glucose Gel by mouth for BS<70. Repeat CBG in 16 minutes, Repeat Gel & Recheck CBG in 15 minutes. Call MD if Deemed Necessary. Call 911 if Resident is Symptomatic, Not Alert or Unconscious   docusate sodium (COLACE) 100 mg, Oral, 2 times daily   folic acid (FOLVITE) 1 mg, Oral, See admin instructions, Take 1 mg by mouth at 8 AM daily on Sun/Mon/Tues/Wed/Thurs/Sat ONLY   furosemide (LASIX) 80 mg, Oral, Daily   hydrOXYzine (ATARAX) 25 mg, Oral, Every 8 hours PRN, Use caution as this medication may make you drowsy   Januvia 100 mg, Oral, Daily   metFORMIN (GLUCOPHAGE) 1,000 mg, Oral, 2 times daily with meals   methotrexate (RHEUMATREX) 10 mg, Oral, Every Fri   NONFORMULARY OR COMPOUNDED ITEM 1 application , Topical, See admin instructions, Triamcinolone 0.1% cream + Cetaphil cream 1:4 - apply to lower legs once a day   oxyCODONE (OXY IR/ROXICODONE) 5-10 mg, Oral, Every 6 hours PRN   pantoprazole (PROTONIX) 40 mg, Oral, Daily   polyethylene glycol (MIRALAX) 17 g, Oral, Daily, Hold for diarrhea   potassium chloride SA (KLOR-CON M) 20 MEQ tablet 20 mEq, Oral, Daily   senna-docusate (SENOKOT-S) 8.6-50 MG tablet 1 tablet, Oral, 2 times daily between meals PRN   tamsulosin (FLOMAX) 0.4 mg, Oral, Daily   terbinafine (LAMISIL) 1 % cream Topical, 2 times daily   triamcinolone (KENALOG) 0.1 % 1 application , Topical, See admin instructions, Apply to itchy areas 2 times a day and and affected areas of the body as needed for dermatitis    Diet Orders (From admission, onward)     Start     Ordered   11/06/22  1037  Diet regular Room service appropriate? Yes; Fluid consistency: Thin  Diet effective now       Question Answer Comment  Room service appropriate? Yes   Fluid consistency: Thin      11/06/22 1036            DVT prophylaxis: SCD's Start: 11/03/22 2145 SCDs Start: 10/27/22 1957 apixaban (ELIQUIS) tablet 5 mg   Lab Results  Component Value Date   PLT 176 11/10/2022      Code Status: Full Code  Family Communication: no family at bedside   Status is: Inpatient  Remains inpatient appropriate because: await insurance auth   Level of care: Med-Surg  Consultants:  Orthopedic surgery   Objective: Vitals:   11/15/22 1609 11/15/22 2033 11/16/22 0513 11/16/22 0753  BP: (!) 118/58 97/77 122/65 120/68  Pulse: 74 70 77 68  Resp: '18 20 20 18  '$ Temp: 98.8 F (37.1 C) 98.5 F (36.9 C) 98.2 F (36.8 C) 97.8 F (36.6 C)  TempSrc: Oral Oral Oral Oral  SpO2: 96% 94% 96% 95%  Weight:      Height:  Intake/Output Summary (Last 24 hours) at 11/16/2022 1136 Last data filed at 11/16/2022 0900 Gross per 24 hour  Intake 250 ml  Output 1550 ml  Net -1300 ml   Wt Readings from Last 3 Encounters:  11/12/22 132.4 kg  09/10/22 (!) 138.8 kg  06/03/22 125.8 kg    Examination: In bed, NAD Rrr +BS, soft  Data Reviewed: I have independently reviewed following labs and imaging studies   CBC Recent Labs  Lab 11/10/22 0516  WBC 5.5  HGB 8.0*  HCT 24.9*  PLT 176  MCV 106.4*  MCH 34.2*  MCHC 32.1  RDW 17.5*    Recent Labs  Lab 11/10/22 0516  NA 129*  K 3.9  CL 94*  CO2 26  GLUCOSE 208*  BUN 40*  CREATININE 0.97  CALCIUM 8.3*  AST 26  ALT 21  ALKPHOS 80  BILITOT 0.5  ALBUMIN 2.4*  MG 2.1    ------------------------------------------------------------------------------------------------------------------ No results for input(s): "CHOL", "HDL", "LDLCALC", "TRIG", "CHOLHDL", "LDLDIRECT" in the last 72 hours.  Lab Results  Component Value Date    HGBA1C 5.3 09/09/2022   ------------------------------------------------------------------------------------------------------------------ No results for input(s): "TSH", "T4TOTAL", "T3FREE", "THYROIDAB" in the last 72 hours.  Invalid input(s): "FREET3"  Cardiac Enzymes No results for input(s): "CKMB", "TROPONINI", "MYOGLOBIN" in the last 168 hours.  Invalid input(s): "CK" ------------------------------------------------------------------------------------------------------------------    Component Value Date/Time   BNP 135.1 (H) 09/09/2022 1042   BNP 94.3 08/15/2013 1834    CBG: Recent Labs  Lab 11/15/22 1117 11/15/22 1539 11/15/22 2210 11/16/22 0733 11/16/22 1134  GLUCAP 212* 192* 236* 150* 189*    No results found for this or any previous visit (from the past 240 hour(s)).    Radiology Studies: No results found.   [name], DO [facility]  Between 7 am - 7 pm I am available, please contact me via Amion (for emergencies) or Securechat (non urgent messages)  Between 7 pm - 7 am I am not available, please contact night coverage MD/APP via Amion

## 2022-11-16 NOTE — Plan of Care (Addendum)
PT A&OX4, pain treated with PO oxy with relief.  Pt to discharge to [facility], about to call report. IV removed by this nurse. Med rec/orders in D/C package.Wound vac applied, Tried calling [employer] and left message for nurse. BP 129/62   Pulse 75   Temp 98.3 F (36.8 C) (Oral)   Resp 18   Ht '6\' 3"'$  (1.905 m)   Wt 132.4 kg   SpO2 98%   BMI 36.48 kg/m   Louanne Skye 11/16/22 6:56 PM

## 2022-11-24 ENCOUNTER — Ambulatory Visit (INDEPENDENT_AMBULATORY_CARE_PROVIDER_SITE_OTHER): Payer: Medicare HMO | Admitting: Family

## 2022-11-24 ENCOUNTER — Encounter: Payer: Self-pay | Admitting: Family

## 2022-11-24 DIAGNOSIS — S88112A Complete traumatic amputation at level between knee and ankle, left lower leg, initial encounter: Secondary | ICD-10-CM

## 2022-11-24 DIAGNOSIS — Z89512 Acquired absence of left leg below knee: Secondary | ICD-10-CM

## 2022-11-24 NOTE — Progress Notes (Signed)
Post-Op Visit Note   Patient: [name]           Date of Birth: [birth year]           MRN: [id number] Visit Date: 11/24/2022 PCP: Sandi Mariscal, MD  Chief Complaint:  Chief Complaint  Patient presents with   Left Leg - Routine Post Op    11/03/2022 left BKA     HPI:  HPI The patient is a 71 year old gentleman seen status post left below-knee amputation on November 22 Patient is a new left transtibial  amputee.  Patient's current comorbidities are not expected to impact the ability to function with the prescribed prosthesis. Patient verbally communicates a strong desire to use a prosthesis. Patient currently requires mobility aids to ambulate without a prosthesis.  Expects not to use mobility aids with a new prosthesis.  Patient is a K2 level ambulator that will use a prosthesis to walk around their home and the community over low level environmental barriers.      Ortho Exam On examination of the left residual limb this is well consolidated well-healed there are staples in place there is no gaping or drainage  Visit Diagnoses: No diagnosis found.  Plan: Staples harvested today without incident we will proceed with prosthesis set up may follow-up in office with [place] in 4 weeks.  Follow-Up Instructions: No follow-ups on file.   Imaging: No results found.  Orders:  No orders of the defined types were placed in this encounter.  No orders of the defined types were placed in this encounter.    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   Subacute eczematous & allergic contact dermatitis  09/09/2022   History of COVID-19 09/09/2022   History of homelessness 09/09/2022   Diverticulosis of colon 09/09/2022   Cholelithiasis 09/09/2022   Hepatic steatosis 09/09/2022   Living in assisted living 09/09/2022   Pain in right foot 05/06/2022   Atrial fibrillation with slow ventricular response ([place]) 12/23/2021   Pain of joint of left ankle and foot 02/26/2021   BPH (benign prostatic hyperplasia) 11/30/2016   Abnormal CT scan, colon    AKI (acute kidney injury) ([place])    Generalized weakness    Abdominal pain    Moderate protein-calorie malnutrition ([place]) 11/10/2016   Constipation, chronic 11/09/2016   CKD (chronic kidney disease) stage 3, GFR 30-59 ml/min (HCC) 11/09/2016   Hyperbilirubinemia 11/09/2016   Hyponatremia 11/08/2016   RBBB 09/13/2013   Snoring 09/13/2013   Edema 09/07/2013   DM2 (diabetes mellitus, type 2) ([place]) 03/16/2013   Arthritis 03/16/2013   Essential hypertension, benign 02/23/2013   Morbid obesity ([place]) 02/23/2013   Paroxysmal atrial fibrillation ([place]) 02/23/2013   HLD (hyperlipidemia) 02/23/2013   Past Medical History:  Diagnosis Date   Adenocarcinoma of colon ([place])    Atrial fibrillation ([place])    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   Cataract    Constipation, chronic 11/09/2016   Diabetes mellitus without complication ([place])    History of COVID-19  09/09/2022   History of homelessness 09/09/2022   History of kidney stones 09/09/2022   History of Stercoral ulcer of rectum 11/28/2016   Hyperlipidemia    Hypertension    Lichen planus    bilateral legs   Nausea & vomiting 09/09/2022   Obesity (BMI 30-39.9) 09/09/2022   Personal history of cecal colon cancer 09/09/2022    Family History  Problem Relation Age of Onset   Brain cancer Mother    Alzheimer's disease Father    Cancer Maternal  Grandmother    Heart attack Maternal Grandfather    Pneumonia Paternal Grandfather     Past Surgical History:  Procedure Laterality Date   AMPUTATION Left 11/03/2022   Procedure: BELOW KNEE  AMPUTATION, LEFT;  Surgeon: Newt Minion, MD;  Location: [place];  Service: Orthopedics;  Laterality: Left;   APPENDECTOMY     APPLICATION OF WOUND VAC Left 11/03/2022   Procedure: APPLICATION OF WOUND VAC;  Surgeon: Newt Minion, MD;  Location: [place];  Service: Orthopedics;  Laterality: Left;   CHOLECYSTECTOMY N/A 09/11/2022   Procedure: LAPAROSCOPIC CHOLECYSTECTOMY;  Surgeon: Coralie Keens, MD;  Location: [facility];  Service: General;  Laterality: N/A;   COLON RESECTION N/A 11/22/2016   Procedure: HAND ASSISTED LAPAROSCOPIC COLON RESECTION;  Surgeon: [name], MD;  Location: [facility];  Service: General;  Laterality: N/A;   COLONOSCOPY N/A 11/17/2016   Procedure: COLONOSCOPY;  Surgeon: [name], MD;  Location: [facility];  Service: Endoscopy;  Laterality: N/A;   FRACTURE SURGERY     MULTIPLE TOOTH EXTRACTIONS     RECTAL EXAM UNDER ANESTHESIA N/A 12/05/2016   Procedure: RECTAL EXAM UNDER ANESTHESIA, DISIMPACTION;  Surgeon: Alphonsa Overall, MD;  Location: [facility];  Service: General;  Laterality: N/A;   Social History   Occupational History   Occupation: unemployed  Tobacco Use   Smoking status: Former    Types: Pipe    Quit date: 09/14/1983    Years since quitting: 39.2   Smokeless tobacco: Never  Substance and Sexual Activity   Alcohol use: No   Drug use: No   Sexual activity: Never    Birth control/protection: Condom

## 2022-11-29 ENCOUNTER — Ambulatory Visit (HOSPITAL_BASED_OUTPATIENT_CLINIC_OR_DEPARTMENT_OTHER): Payer: Medicare Other | Admitting: Family

## 2022-12-27 ENCOUNTER — Ambulatory Visit (HOSPITAL_BASED_OUTPATIENT_CLINIC_OR_DEPARTMENT_OTHER): Payer: Medicare HMO | Admitting: Family

## 2022-12-27 ENCOUNTER — Encounter (HOSPITAL_BASED_OUTPATIENT_CLINIC_OR_DEPARTMENT_OTHER): Payer: Self-pay | Admitting: Family

## 2022-12-27 VITALS — BP 127/72 | HR 77 | Ht 75.0 in | Wt 254.0 lb

## 2022-12-27 DIAGNOSIS — I4821 Permanent atrial fibrillation: Secondary | ICD-10-CM

## 2022-12-27 DIAGNOSIS — E782 Mixed hyperlipidemia: Secondary | ICD-10-CM

## 2022-12-27 DIAGNOSIS — I10 Essential (primary) hypertension: Secondary | ICD-10-CM | POA: Diagnosis not present

## 2022-12-27 DIAGNOSIS — D6859 Other primary thrombophilia: Secondary | ICD-10-CM

## 2022-12-27 DIAGNOSIS — I77810 Thoracic aortic ectasia: Secondary | ICD-10-CM

## 2022-12-27 NOTE — Progress Notes (Signed)
Office Visit    Patient Name: [name] Date of Encounter: 12/27/2022  PCP:  Sandi Mariscal, MD - sees PCp at [facility]  Cardiologist:  Pixie Casino, MD  Advanced Practice Provider:  No care team member to display Electrophysiologist:  None      Chief Complaint    [name] is a 72 y.o. male presents today for follow up of atrial fibrillation.  Past Medical History    Past Medical History:  Diagnosis Date   Adenocarcinoma of colon ([place])    Atrial fibrillation ([place])    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   Cataract    Constipation, chronic 11/09/2016   Diabetes mellitus without complication ([place])    History of COVID-19 09/09/2022   History of homelessness 09/09/2022   History of kidney stones 09/09/2022   History of Stercoral ulcer of rectum 11/28/2016   Hyperlipidemia    Hypertension    Lichen planus    bilateral legs   Nausea & vomiting 09/09/2022   Obesity (BMI 30-39.9) 09/09/2022   Personal history of cecal colon cancer 09/09/2022   Past Surgical History:  Procedure Laterality Date   AMPUTATION Left 11/03/2022   Procedure: BELOW KNEE  AMPUTATION, LEFT;  Surgeon: Newt Minion, MD;  Location: [place];  Service: Orthopedics;  Laterality: Left;   APPENDECTOMY     APPLICATION OF WOUND VAC Left 11/03/2022   Procedure: APPLICATION OF WOUND VAC;  Surgeon: Newt Minion, MD;  Location: [place];  Service: Orthopedics;  Laterality: Left;   CHOLECYSTECTOMY N/A 09/11/2022   Procedure: LAPAROSCOPIC CHOLECYSTECTOMY;  Surgeon: Coralie Keens, MD;  Location[facility];  Service: General;  Laterality: N/A;   COLON RESECTION N/A 11/22/2016   Procedure: HAND ASSISTED LAPAROSCOPIC COLON RESECTION;  Surgeon: [name], MD;  Location: [facility];  Service: General;  Laterality: N/A;   COLONOSCOPY N/A 11/17/2016   Procedure: COLONOSCOPY;  Surgeon: [name], MD;  [facility];  Service: Endoscopy;  Laterality:  N/A;   FRACTURE SURGERY     MULTIPLE TOOTH EXTRACTIONS     RECTAL EXAM UNDER ANESTHESIA N/A 12/05/2016   Procedure: RECTAL EXAM UNDER ANESTHESIA, DISIMPACTION;  Surgeon: Alphonsa Overall, MD;  [facility];  Service: General;  Laterality: N/A;   Allergies  Allergies  Allergen Reactions   Other Itching and Other (See Comments)    Wynetta [employer] bandage (self-gripping) - erythema, itching    History of Present Illness    [name] is a 72 y.o. male with a hx of atrial fibrillation, BPH, anxiety, constipation, DM2, HTN, HLD, macrocytic anemia, s/p L BKA, bifasicular block last seen 06/03/22.   He established with Dr. Debara Pickett in 2014 due to moving to the area from [place] with known diagnosis of atrial fibrillation. He has been rate controlled. Intermittently on anticoagulation due to financial limitations. He has not followed routinely and has primarily been seen during hospitalizations.   He was admitted 12/22/21 with abdominal pain, dizziness. Abdominal imaging consistent with cholelithiasis without cholecystitis. He was found to be bradycardia in the 30s with bifascicular block. Digoxin level 1.1 and discontinued. Metoprolol additionally discontinued. Echo with EF 60-65%, no RWMA, RV normal size and function, LA mildly dilated, no significant valvular abnormalities, aortic root 11m.   Seen 01/2022 doing well form a cardiac perspective wearing Unna boots followed by wound clinic due to severe stasis dermatitis. He was provided a Rx for Lasix PRN.  Last seen 05/2022 doing well form a cardiac perspective and no changes made at that time.   Admitted 08/2022 for cholecystitis. 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  Presents today for follow up independently. He is at [facility] for short term rehab and hopeful to return to his usual ALF. Enjoying working with PT. Reports no shortness of breath nor dyspnea on exertion. Reports no chest pain, pressure, or  tightness. No edema, orthopnea, PND. Reports no palpitations.  He is planning to get prosthesis.   EKGs/Labs/Other Studies Reviewed:   The following studies were reviewed today:  Echo 09/10/22  1. Left ventricular ejection fraction, by estimation, is 60 to 65%. The  left ventricle has normal function. The left ventricle has no regional  wall motion abnormalities. There is mild left ventricular hypertrophy.  Left ventricular diastolic parameters  were normal.   2. Right ventricular systolic function is normal. The right ventricular  size is normal. There is moderately elevated pulmonary artery systolic  pressure.   3. Left atrial size was moderately dilated.   4. The mitral valve is abnormal. Trivial mitral valve regurgitation. No  evidence of mitral stenosis. Moderate mitral annular calcification.   5. The aortic valve is tricuspid. There is moderate calcification of the  aortic valve. There is moderate thickening of the aortic valve. Aortic  valve regurgitation is not visualized. Aortic valve sclerosis is present,  with no evidence of aortic valve  stenosis.   6. Aortic dilatation noted. There is severe dilatation of the aortic  root, measuring 46 mm. There is mild dilatation of the ascending aorta,  measuring 39 mm.   7. The inferior vena cava is dilated in size with >50% respiratory  variability, suggesting right atrial pressure of 8 mmHg.  Echo 12/23/21  1. Left ventricular ejection fraction, by estimation, is 60 to 65%. The  left ventricle has normal function. The left ventricle has no regional  wall motion abnormalities. Left ventricular diastolic function could not  be evaluated.   2. Right ventricular systolic function was not well visualized. The right  ventricular size is normal. Tricuspid regurgitation signal is inadequate  for assessing PA pressure.   3. Left atrial size was mildly dilated.   4. The mitral valve is normal in structure. No evidence of mitral valve   regurgitation. No evidence of mitral stenosis.   5. The aortic valve is normal in structure. Aortic valve regurgitation is  not visualized. No aortic stenosis is present.   6. Aortic dilatation noted. Aneurysm of the aortic root, measuring 47 mm.   7. The inferior vena cava is dilated in size with <50% respiratory  variability, suggesting right atrial pressure of 15 mmHg.   EKG:  No EKG today.   Recent Labs: 09/09/2022: B Natriuretic Peptide 135.1 11/10/2022: ALT 21; BUN 40; Creatinine, Ser 0.97; Hemoglobin 8.0; Magnesium 2.1; Platelets 176; Potassium 3.9; Sodium 129  Recent Lipid Panel    Component Value Date/Time   CHOL 134 04/26/2014 0830   TRIG 110 04/26/2014 0830   HDL 51 04/26/2014 0830   CHOLHDL 2.6 04/26/2014 0830   VLDL 22 04/26/2014 0830   LDLCALC 61 04/26/2014 0830    Risk Assessment/Calculations:   CHA2DS2-VASc Score = 4  This indicates a 4.8% annual risk of stroke. The patient's score is based upon: CHF History: 0 HTN History: 1 Diabetes History: 1 Stroke History: 0 Vascular Disease History: 1 Age Score: 1 Gender Score: 0   Home Medications  No outpatient medications have been marked as taking for the 12/27/22 encounter (Appointment) with Loel Dubonnet, NP.     Review of Systems      All other systems reviewed and are otherwise negative except as noted above.  Physical Exam    VS:  There were no vitals taken for this visit. , BMI There is no height or weight on file to calculate BMI.  Wt Readings from Last 3 Encounters:  11/12/22 291 lb 14.2 oz (132.4 kg)  09/10/22 (!) 306 lb (138.8 kg)  06/03/22 277 lb 6.4 oz (125.8 kg)   GEN: Well nourished, well developed, in no acute distress. HEENT: normal. Neck: Supple, no JVD, carotid bruits, or masses. Cardiac:  RRR, no murmurs, rubs, or gallops. No clubbing, cyanosis, edema.  Radials/PT 2+ and equal bilaterally.  Respiratory:  Respirations regular and unlabored, clear to auscultation  bilaterally. GI: Soft, nontender, nondistended. MS: No deformity or atrophy. Skin: Warm and dry, no rash. Bilateral LE with Unna boots. Neuro:  Strength and sensation are intact. Psych: Normal affect.  Assessment & Plan    Atrial fibrillation / Bradycardia / Bifascicular block (RBBB/LAFB) -A. Digoxin and Metoprolol discontinued during January 2023 admission due to bifascicular block - avoid AV nodal blocking agents. Continue rate control strategy as he is asymptomatic in regards to his atrial fibrillation. Rate well controlled today.   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 Echo 08/2022 aortic root 460mand ascending aorta 3964mContinue BP control and Atorvastatin. Repeat echo vs CT in 1 year to be coordinated at follow up.   Chronic anticoagulation -Denies bleeding complications. CHA2DS2-VASc Score = 4 [CHF History: 0, HTN History: 1, Diabetes History: 1, Stroke History: 0, Vascular Disease History: 1, Age Score: 1, Gender Score: 0].  Therefore, the patient's annual risk of stroke is 4.8 %.    Continue Eliquis '5mg'$  BID - denies bleeding complications.  HTN - BP well controlled. Not requiring antihypertensive agents.   HLD - Continue Atorvatatin. Denies myalgias. Due for fasting lipid panel with next labs. Will defer today as he is not fasting.   Anemia - 11/10/22 Hb 8.0 at hospital discharge. Per his report LinMadelynn DoneF has   DM2 - Continue to follow with PCP.   LE edema - Quiescent on present dose Lasix. Continue same.   Disposition: Follow up in 6 months with KenPixie CasinoD or APP.  Signed, CaiLoel DubonnetP 12/27/2022, 2:38 PM [place]

## 2022-12-27 NOTE — Patient Instructions (Signed)
Medication Instructions:  Continue your current medications.   *If you need a refill on your cardiac medications before your next appointment, please call your pharmacy*  Lab Work: None ordered today.  We will ask [place] to fax over your most recent BMP and CBC.   If you have not had BMP and CBC in a month we will ask [place] to collect along with a fasting lipid panel.   Testing/Procedures: None ordered today.   Follow-Up: At [facility], you and your health needs are our priority.  As part of our continuing mission to provide you with exceptional heart care, we have created designated Provider Care Teams.  These Care Teams include your primary Cardiologist (physician) and Advanced Practice Providers (APPs -  Physician Assistants and Nurse Practitioners) who all work together to provide you with the care you need, when you need it.  We recommend signing up for the patient portal called "MyChart".  Sign up information is provided on this After Visit Summary.  MyChart is used to connect with patients for Virtual Visits (Telemedicine).  Patients are able to view lab/test results, encounter notes, upcoming appointments, etc.  Non-urgent messages can be sent to your provider as well.   To learn more about what you can do with MyChart, go to [email or web address].    Your next appointment:   6 month(s)  Provider:   K. Mali Hilty, MD or Laurann Montana, NP    Other Instructions  Heart Healthy Diet Recommendations: A low-salt diet is recommended. Meats should be grilled, baked, or boiled. Avoid fried foods. Focus on lean protein sources like fish or chicken with vegetables and fruits. The American Heart Association is a Microbiologist!  American Heart Association Diet and Lifeystyle Recommendations   Exercise recommendations: The American Heart Association recommends 150 minutes of moderate intensity exercise weekly. Try 30 minutes of moderate intensity exercise 4-5  times per week. This could include walking, jogging, or swimming. Keep up the good work with physical therapy!

## 2022-12-28 ENCOUNTER — Encounter: Payer: Self-pay | Admitting: Family

## 2022-12-28 ENCOUNTER — Ambulatory Visit (INDEPENDENT_AMBULATORY_CARE_PROVIDER_SITE_OTHER): Payer: Medicare HMO | Admitting: Family

## 2022-12-28 DIAGNOSIS — Z89512 Acquired absence of left leg below knee: Secondary | ICD-10-CM

## 2022-12-28 DIAGNOSIS — S88112A Complete traumatic amputation at level between knee and ankle, left lower leg, initial encounter: Secondary | ICD-10-CM

## 2022-12-28 NOTE — Progress Notes (Signed)
Post-Op Visit Note   Patient: [name]           Date of Birth: [birth year]           MRN: [id number] Visit Date: 12/28/2022 PCP: Sandi Mariscal, MD  Chief Complaint:  Chief Complaint  Patient presents with   Left Leg - Follow-up    HPI:  HPI The patient is a 72 year old gentleman who presents status post left below-knee amputation.  He is currently residing at skilled nursing.  He has been seen by [facility] and has an upcoming appointment. is currently wearing a shrinker with direct skin contact. Ortho Exam On examination left residual limb well-healed there is no open area no erythema no edema  Visit Diagnoses: No diagnosis found.  Plan: Continue shrinker around-the-clock.  Proceed with prosthesis set up.  Follow-up in the office in 3 months.  Follow-Up Instructions: No follow-ups on file.   Imaging: No results found.  Orders:  No orders of the defined types were placed in this encounter.  No orders of the defined types were placed in this encounter.    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   Subacute eczematous & allergic contact dermatitis  09/09/2022   History of COVID-19 09/09/2022   History of homelessness 09/09/2022   Diverticulosis of colon 09/09/2022   Cholelithiasis 09/09/2022   Hepatic steatosis 09/09/2022   Living in assisted living 09/09/2022   Pain in right foot 05/06/2022   Atrial fibrillation with slow ventricular response ([place]) 12/23/2021    Pain of joint of left ankle and foot 02/26/2021   BPH (benign prostatic hyperplasia) 11/30/2016   Abnormal CT scan, colon    AKI (acute kidney injury) ([place])    Generalized weakness    Abdominal pain    Moderate protein-calorie malnutrition ([place]) 11/10/2016   Constipation, chronic 11/09/2016   CKD (chronic kidney disease) stage 3, GFR 30-59 ml/min (HCC) 11/09/2016   Hyperbilirubinemia 11/09/2016   Hyponatremia 11/08/2016   RBBB 09/13/2013   Snoring 09/13/2013   Edema 09/07/2013   DM2 (diabetes mellitus, type 2) ([place]) 03/16/2013   Arthritis 03/16/2013   Essential hypertension, benign 02/23/2013   Morbid obesity ([place]) 02/23/2013   Paroxysmal atrial fibrillation ([place]) 02/23/2013   HLD (hyperlipidemia) 02/23/2013   Past Medical History:  Diagnosis Date   Adenocarcinoma of colon ([place])    Atrial fibrillation ([place])    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   Cataract    Constipation, chronic 11/09/2016   Diabetes mellitus without complication ([place])    History of COVID-19 09/09/2022   History of homelessness 09/09/2022   History of kidney stones 09/09/2022   History of Stercoral ulcer of rectum 11/28/2016   Hyperlipidemia    Hypertension    Lichen planus    bilateral legs   Nausea & vomiting 09/09/2022   Obesity (BMI 30-39.9) 09/09/2022   Personal history of cecal colon cancer 09/09/2022    Family History  Problem Relation Age of Onset   Brain cancer Mother  Alzheimer's disease Father    Cancer Maternal Grandmother    Heart attack Maternal Grandfather    Pneumonia Paternal Grandfather     Past Surgical History:  Procedure Laterality Date   AMPUTATION Left 11/03/2022   Procedure: BELOW KNEE  AMPUTATION, LEFT;  Surgeon: Newt Minion, MD;  Location: [facility];  Service: Orthopedics;  Laterality: Left;   APPENDECTOMY     APPLICATION OF WOUND VAC Left 11/03/2022   Procedure: APPLICATION OF WOUND VAC;  Surgeon: Newt Minion, MD;  Location: [facility];  Service:  Orthopedics;  Laterality: Left;   CHOLECYSTECTOMY N/A 09/11/2022   Procedure: LAPAROSCOPIC CHOLECYSTECTOMY;  Surgeon: Coralie Keens, MD;  [facility];  Service: General;  Laterality: N/A;   COLON RESECTION N/A 11/22/2016   Procedure: HAND ASSISTED LAPAROSCOPIC COLON RESECTION;  Surgeon: [name], MD;  Location: [facility];  Service: General;  Laterality: N/A;   COLONOSCOPY N/A 11/17/2016   Procedure: COLONOSCOPY;  Surgeon: [name], MD;  [facility];  Service: Endoscopy;  Laterality: N/A;   FRACTURE SURGERY     MULTIPLE TOOTH EXTRACTIONS     RECTAL EXAM UNDER ANESTHESIA N/A 12/05/2016   Procedure: RECTAL EXAM UNDER ANESTHESIA, DISIMPACTION;  Surgeon: Alphonsa Overall, MD;  [facility];  Service: General;  Laterality: N/A;   Social History   Occupational History   Occupation: unemployed  Tobacco Use   Smoking status: Former    Types: Pipe    Quit date: 09/14/1983    Years since quitting: 39.3   Smokeless tobacco: Never  Substance and Sexual Activity   Alcohol use: No   Drug use: No   Sexual activity: Never    Birth control/protection: Condom

## 2023-01-17 ENCOUNTER — Ambulatory Visit (HOSPITAL_BASED_OUTPATIENT_CLINIC_OR_DEPARTMENT_OTHER): Payer: Medicare Other

## 2023-01-17 ENCOUNTER — Telehealth (HOSPITAL_BASED_OUTPATIENT_CLINIC_OR_DEPARTMENT_OTHER): Payer: Self-pay

## 2023-01-17 NOTE — Telephone Encounter (Signed)
-----   Message from Heloise Beecham sent at 01/17/2023  9:14 AM EST ----- Regarding: Echo Test Patient wants to reschedule Echo test

## 2023-01-17 NOTE — Telephone Encounter (Signed)
Echo ordered extended so it could be rescheduled for patient

## 2023-02-03 ENCOUNTER — Ambulatory Visit (INDEPENDENT_AMBULATORY_CARE_PROVIDER_SITE_OTHER): Payer: Medicare HMO

## 2023-02-03 DIAGNOSIS — I10 Essential (primary) hypertension: Secondary | ICD-10-CM | POA: Diagnosis not present

## 2023-02-03 DIAGNOSIS — I77810 Thoracic aortic ectasia: Secondary | ICD-10-CM | POA: Diagnosis not present

## 2023-02-03 LAB — ECHOCARDIOGRAM COMPLETE
Area-P 1/2: 3.12 cm2
S' Lateral: 3.25 cm

## 2023-02-04 ENCOUNTER — Telehealth (HOSPITAL_BASED_OUTPATIENT_CLINIC_OR_DEPARTMENT_OTHER): Payer: Self-pay | Admitting: *Deleted

## 2023-02-04 DIAGNOSIS — I77810 Thoracic aortic ectasia: Secondary | ICD-10-CM

## 2023-02-04 NOTE — Telephone Encounter (Signed)
Loel Dubonnet, NP 02/04/2023 11:58 AM EST     Echocardiogram with normal heart pumping function.  Right ventricle normal size and function.  No significant valvular abnormalities.  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   Recommend CT aorta in one year for monitoring.    Advised patient of results, CT orders placed

## 2023-03-03 ENCOUNTER — Ambulatory Visit (HOSPITAL_BASED_OUTPATIENT_CLINIC_OR_DEPARTMENT_OTHER): Payer: Medicare HMO | Admitting: Internal Medicine

## 2023-03-29 ENCOUNTER — Ambulatory Visit (INDEPENDENT_AMBULATORY_CARE_PROVIDER_SITE_OTHER): Payer: Medicare HMO | Admitting: Family

## 2023-03-29 DIAGNOSIS — Z89512 Acquired absence of left leg below knee: Secondary | ICD-10-CM

## 2023-03-29 DIAGNOSIS — M25512 Pain in left shoulder: Secondary | ICD-10-CM

## 2023-03-30 ENCOUNTER — Encounter: Payer: Self-pay | Admitting: Family

## 2023-03-30 NOTE — Progress Notes (Signed)
Office Visit Note   Patient: [name]           Date of Birth: 10/28/1951           MRN: [id number] Visit Date: 03/29/2023              Requested by: Salli Real, MD [address] [place],  [place] [address] PCP: Salli Real, MD  Chief Complaint  Patient presents with   Left Leg - Follow-up    Hx BKA 11/03/2022      HPI: The patient is 72 year old gentleman who presents today for 2 separate issues.  He is status post left below-knee amputation currently residing at skilled nursing.  He states he has not yet proceeded with prosthesis set up he does have an order.  He is continue to wear shrinker he did have some skin breakdown at his last visit with [facility]  He is also having some significant left shoulder pain.  He states that he relies on his upper extremities and had an acute episode of pain when getting up using his walker.  He now has pain when he uses the left upper extremity with loss of range of motion due to pain difficulty reaching behind back or across chest due to pain cannot reach above his head.  Reports has had radiographs of the left shoulder at an outside facility which were negative for fracture.  Assessment & Plan: Visit Diagnoses: No diagnosis found.  Plan: Will proceed with MRI left shoulder.  Patient eager to further clarify the pathology of his left shoulder.  Proceed with prosthesis set up on the left  Follow-Up Instructions: No follow-ups on file.   Left Shoulder Exam   Tenderness  The patient is experiencing tenderness in the biceps tendon.  Tests  Cross arm: positive Impingement: positive Drop arm: positive  Other  Pulse: present       Patient is alert, oriented, no adenopathy, well-dressed, normal affect, normal respiratory effort. On examination left residual limb this is well-healed consolidated.  There is no open area no drainage.  Imaging: No results found. No images are attached to the encounter.  Labs: Lab Results   Component Value Date   HGBA1C 5.3 09/09/2022   HGBA1C 5.5 12/22/2021   HGBA1C 4.9 12/04/2016   ESRSEDRATE 135 (H) 10/27/2022   CRP 3.4 (H) 10/27/2022   REPTSTATUS 11/01/2022 FINAL 10/27/2022   CULT  10/27/2022    NO GROWTH 5 DAYS Performed at [facility], [address] [place], [place] [address]    LABORGA No Herpes Simplex Virus detected. 05/25/2013   LABORGA No Virus Isolated in Cell Culture 05/25/2013   LABORGA  05/25/2013      A negative result does not exclude the possibility of   LABORGA  05/25/2013    virus infection;inappropriate specimen collection,storage   LABORGA  05/25/2013    and transport may lead to false negative culture results.     Lab Results  Component Value Date   ALBUMIN 2.4 (L) 11/10/2022   ALBUMIN 2.6 (L) 11/08/2022   ALBUMIN 2.6 (L) 11/05/2022   PREALBUMIN 10 (L) 09/09/2022   PREALBUMIN 12.5 (L) 11/25/2016    Lab Results  Component Value Date   MG 2.1 11/10/2022   MG 1.9 11/08/2022   MG 1.9 11/06/2022   No results found for: "VD25OH"  Lab Results  Component Value Date   PREALBUMIN 10 (L) 09/09/2022   PREALBUMIN 12.5 (L) 11/25/2016      Latest Ref  Rng & Units 11/10/2022    5:16 AM 11/08/2022    3:46 AM 11/06/2022    3:14 AM  CBC EXTENDED  WBC 4.0 - 10.5 K/uL 5.5  6.0  5.2   RBC 4.22 - 5.81 MIL/uL 2.34  2.51  2.48   Hemoglobin 13.0 - 17.0 g/dL 8.0  8.6  8.7   HCT 16.1 - 52.0 % 24.9  26.6  25.6   Platelets 150 - 400 K/uL 176  171  156      There is no height or weight on file to calculate BMI.  Orders:  No orders of the defined types were placed in this encounter.  No orders of the defined types were placed in this encounter.    Procedures: No procedures performed  Clinical Data: No additional findings.  ROS:  All other systems negative, except as noted in the HPI. Review of Systems  Objective: Vital Signs: There were no vitals taken for this visit.  Specialty Comments:  No specialty comments  available.  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   Subacute eczematous & allergic contact dermatitis  09/09/2022   History of COVID-19 09/09/2022   History of homelessness 09/09/2022   Diverticulosis of colon 09/09/2022   Cholelithiasis 09/09/2022   Hepatic steatosis 09/09/2022   Living in assisted living 09/09/2022   Pain in right foot 05/06/2022   Atrial fibrillation with slow ventricular response 12/23/2021   Pain of joint of left ankle and foot 02/26/2021   BPH (benign prostatic hyperplasia) 11/30/2016   Abnormal CT scan, colon    AKI (acute kidney injury)    Generalized weakness    Abdominal pain    Moderate protein-calorie malnutrition 11/10/2016   Constipation, chronic 11/09/2016   CKD (chronic kidney disease) stage 3, GFR 30-59 ml/min 11/09/2016   Hyperbilirubinemia 11/09/2016   Hyponatremia 11/08/2016   RBBB 09/13/2013   Snoring 09/13/2013   Edema 09/07/2013   DM2 (diabetes mellitus, type 2) 03/16/2013   Arthritis 03/16/2013   Essential hypertension, benign 02/23/2013   Morbid obesity (HCC) 02/23/2013   Paroxysmal atrial fibrillation 02/23/2013   HLD (hyperlipidemia) 02/23/2013   Past Medical History:  Diagnosis Date   Adenocarcinoma of colon    Atrial fibrillation    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   Cataract    Constipation, chronic 11/09/2016   Diabetes mellitus without complication    History of COVID-19 09/09/2022   History of  homelessness 09/09/2022   History of kidney stones 09/09/2022   History of Stercoral ulcer of rectum 11/28/2016   Hyperlipidemia    Hypertension    Lichen planus    bilateral legs   Nausea & vomiting 09/09/2022   Obesity (BMI 30-39.9) 09/09/2022   Personal history of cecal colon cancer 09/09/2022    Family History  Problem Relation Age of Onset   Brain cancer Mother    Alzheimer's disease Father    Cancer Maternal Grandmother    Heart attack Maternal Grandfather    Pneumonia Paternal Grandfather  Past Surgical History:  Procedure Laterality Date   AMPUTATION Left 11/03/2022   Procedure: BELOW KNEE  AMPUTATION, LEFT;  Surgeon: Nadara Mustard, MD;  Location: [facility];  Service: Orthopedics;  Laterality: Left;   APPENDECTOMY     APPLICATION OF WOUND VAC Left 11/03/2022   Procedure: APPLICATION OF WOUND VAC;  Surgeon: Nadara Mustard, MD;  [facility];  Service: Orthopedics;  Laterality: Left;   CHOLECYSTECTOMY N/A 09/11/2022   Procedure: LAPAROSCOPIC CHOLECYSTECTOMY;  Surgeon: Abigail Miyamoto, MD;  [facility];  Service: General;  Laterality: N/A;   COLON RESECTION N/A 11/22/2016   Procedure: HAND ASSISTED LAPAROSCOPIC COLON RESECTION;  Surgeon: Avel Peace, MD;  Location: [facility];  Service: General;  Laterality: N/A;   COLONOSCOPY N/A 11/17/2016   Procedure: COLONOSCOPY;  Surgeon: Meryl Dare, MD;  [facility];  Service: Endoscopy;  Laterality: N/A;   FRACTURE SURGERY     MULTIPLE TOOTH EXTRACTIONS     RECTAL EXAM UNDER ANESTHESIA N/A 12/05/2016   Procedure: RECTAL EXAM UNDER ANESTHESIA, DISIMPACTION;  Surgeon: Ovidio Kin, MD;  [facility];  Service: General;  Laterality: N/A;   Social History   Occupational History   Occupation: unemployed  Tobacco Use   Smoking status: Former    Types: Pipe    Quit date: 09/14/1983    Years since quitting: 39.5   Smokeless tobacco: Never  Substance and Sexual Activity   Alcohol use: No   Drug use: No    Sexual activity: Never    Birth control/protection: Condom

## 2023-04-11 ENCOUNTER — Encounter: Payer: Self-pay | Admitting: Family

## 2023-04-14 ENCOUNTER — Telehealth: Payer: Self-pay | Admitting: Family

## 2023-04-14 NOTE — Telephone Encounter (Signed)
Called pt 1X and left vm for pt to call and set an MRI Review with PA Erin after 5/20

## 2023-04-18 ENCOUNTER — Telehealth: Payer: Self-pay | Admitting: Family

## 2023-04-18 NOTE — Telephone Encounter (Signed)
Pt is scheduled for MRI review on 05/10/23 w/ Denny Peon

## 2023-04-18 NOTE — Telephone Encounter (Signed)
Spoke with pt and living facility and asked we can call them to set appt due to transportation. Will attempt to call to set MRI review with PA Erin after 5/20

## 2023-05-02 ENCOUNTER — Ambulatory Visit
Admission: RE | Admit: 2023-05-02 | Discharge: 2023-05-02 | Disposition: A | Discharge status: Home / Self Care | Payer: Medicare HMO | Source: Ambulatory Visit | Attending: Family | Admitting: Family

## 2023-05-02 DIAGNOSIS — M25512 Pain in left shoulder: Secondary | ICD-10-CM

## 2023-05-04 ENCOUNTER — Telehealth: Payer: Self-pay | Admitting: Internal Medicine

## 2023-05-04 ENCOUNTER — Encounter: Payer: Self-pay | Admitting: Internal Medicine

## 2023-05-04 NOTE — Telephone Encounter (Signed)
Call to number given and it was for the patient not NP.  He was at a new PC appt.  [facility] He states a 3 hour visit with new PCP.  Advised to send the paperwork to our office.  He also has a F/U appt with [place] on June 28th to discuss

## 2023-05-04 NOTE — Telephone Encounter (Signed)
NP from [facility] called and said that patient needs to have an ABI test done

## 2023-05-06 ENCOUNTER — Other Ambulatory Visit: Payer: Self-pay | Admitting: Nurse Practitioner

## 2023-05-06 DIAGNOSIS — I890 Lymphedema, not elsewhere classified: Secondary | ICD-10-CM

## 2023-05-10 ENCOUNTER — Ambulatory Visit (INDEPENDENT_AMBULATORY_CARE_PROVIDER_SITE_OTHER): Payer: Medicare HMO | Admitting: Family

## 2023-05-10 ENCOUNTER — Encounter: Payer: Self-pay | Admitting: Family

## 2023-05-10 DIAGNOSIS — M75112 Incomplete rotator cuff tear or rupture of left shoulder, not specified as traumatic: Secondary | ICD-10-CM

## 2023-05-10 DIAGNOSIS — M25512 Pain in left shoulder: Secondary | ICD-10-CM

## 2023-05-10 MED ORDER — METHYLPREDNISOLONE ACETATE 40 MG/ML IJ SUSP
40.0000 mg | INTRAMUSCULAR | Status: AC | PRN
Start: 2023-05-10 — End: 2023-05-10
  Administered 2023-05-10: 40 mg via INTRA_ARTICULAR

## 2023-05-10 MED ORDER — LIDOCAINE HCL 1 % IJ SOLN
5.0000 mL | INTRAMUSCULAR | Status: AC | PRN
Start: 2023-05-10 — End: 2023-05-10
  Administered 2023-05-10: 5 mL

## 2023-05-10 NOTE — Progress Notes (Signed)
Office Visit Note   Patient: [name]           Date of Birth: Apr 23, 1951           MRN: [id number] Visit Date: 05/10/2023              Requested by: Salli Real, MD [address] [place],  [place] [address] PCP: Salli Real, MD  Chief Complaint  Patient presents with   Left Shoulder - Follow-up    MRI review       HPI:   The patient is a 72 year old gentleman who presents today for MRI review of the left shoulder.  He is status post recent left below-knee amputation and has been wheelchair-bound has been having pain getting from a seated to a standing position and ambulating with his walker did have an acute event of pain to the left shoulder while in rehab.  Assessment & Plan: Visit Diagnoses: No diagnosis found.  Plan: Depo-Medrol injection left shoulder.  Patient tolerated well.  He will call office if he is able to proceed with home health physical therapy as his insurance company will be changing soon.  We will place an order at his convenience.  Follow-Up Instructions: No follow-ups on file.   Left Shoulder Exam   Tenderness  The patient is experiencing tenderness in the biceps tendon.  Range of Motion  Active abduction:  60  Forward flexion:  60   Other  Sensation: normal Pulse: present       Patient is alert, oriented, no adenopathy, well-dressed, normal affect, normal respiratory effort.   Imaging: No results found. No images are attached to the encounter.  Labs: Lab Results  Component Value Date   HGBA1C 5.3 09/09/2022   HGBA1C 5.5 12/22/2021   HGBA1C 4.9 12/04/2016   ESRSEDRATE 135 (H) 10/27/2022   CRP 3.4 (H) 10/27/2022   REPTSTATUS 11/01/2022 FINAL 10/27/2022   CULT  10/27/2022    NO GROWTH 5 DAYS Performed at [facility], [address], [place], [place] [address]    LABORGA No Herpes Simplex Virus detected. 05/25/2013   LABORGA No Virus Isolated in Cell Culture 05/25/2013   LABORGA  05/25/2013      A negative result  does not exclude the possibility of   LABORGA  05/25/2013    virus infection;inappropriate specimen collection,storage   LABORGA  05/25/2013    and transport may lead to false negative culture results.     Lab Results  Component Value Date   ALBUMIN 2.4 (L) 11/10/2022   ALBUMIN 2.6 (L) 11/08/2022   ALBUMIN 2.6 (L) 11/05/2022   PREALBUMIN 10 (L) 09/09/2022   PREALBUMIN 12.5 (L) 11/25/2016    Lab Results  Component Value Date   MG 2.1 11/10/2022   MG 1.9 11/08/2022   MG 1.9 11/06/2022   No results found for: "VD25OH"  Lab Results  Component Value Date   PREALBUMIN 10 (L) 09/09/2022   PREALBUMIN 12.5 (L) 11/25/2016      Latest Ref Rng & Units 11/10/2022    5:16 AM 11/08/2022    3:46 AM 11/06/2022    3:14 AM  CBC EXTENDED  WBC 4.0 - 10.5 K/uL 5.5  6.0  5.2   RBC 4.22 - 5.81 MIL/uL 2.34  2.51  2.48   Hemoglobin 13.0 - 17.0 g/dL 8.0  8.6  8.7   HCT 82.9 - 52.0 % 24.9  26.6  25.6   Platelets 150 - 400 K/uL 176  171  156  There is no height or weight on file to calculate BMI.  Orders:  No orders of the defined types were placed in this encounter.  No orders of the defined types were placed in this encounter.    Procedures: Large Joint Inj: L subacromial bursa on 05/10/2023 10:41 AM Indications: pain Details: 22 G 1.5 in needle Medications: 5 mL lidocaine 1 %; 40 mg methylPREDNISolone acetate 40 MG/ML Consent was given by the patient.      Clinical Data: No additional findings.  ROS:  All other systems negative, except as noted in the HPI. Review of Systems  Objective: Vital Signs: There were no vitals taken for this visit.  Specialty Comments:  No specialty comments available.  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   Subacute eczematous & allergic contact dermatitis  09/09/2022   History of COVID-19 09/09/2022   History of homelessness 09/09/2022   Diverticulosis of colon 09/09/2022   Cholelithiasis 09/09/2022   Hepatic steatosis 09/09/2022   Living in assisted living 09/09/2022   Pain in right foot 05/06/2022   Atrial fibrillation with slow ventricular response (HCC) 12/23/2021   Pain of joint of left ankle and foot 02/26/2021   BPH (benign prostatic hyperplasia) 11/30/2016   Abnormal CT scan, colon    AKI (acute kidney injury) (HCC)    Generalized weakness    Abdominal pain    Moderate protein-calorie malnutrition (HCC) 11/10/2016   Constipation, chronic 11/09/2016   CKD (chronic kidney disease) stage 3, GFR 30-59 ml/min (HCC) 11/09/2016   Hyperbilirubinemia 11/09/2016   Hyponatremia 11/08/2016   RBBB 09/13/2013   Snoring 09/13/2013   Edema 09/07/2013   DM2 (diabetes mellitus, type 2) (HCC) 03/16/2013   Arthritis 03/16/2013   Essential hypertension, benign 02/23/2013   Morbid obesity (HCC) 02/23/2013   Paroxysmal atrial fibrillation (HCC) 02/23/2013   HLD (hyperlipidemia) 02/23/2013   Past Medical History:  Diagnosis Date   Adenocarcinoma of colon (HCC)    Atrial fibrillation (HCC)    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   Cataract    Constipation, chronic 11/09/2016   Diabetes mellitus without complication (HCC)    History of COVID-19 09/09/2022   History of homelessness 09/09/2022   History of kidney stones 09/09/2022   History of Stercoral ulcer of rectum 11/28/2016   Hyperlipidemia    Hypertension    Lichen planus    bilateral legs   Nausea & vomiting 09/09/2022    Obesity (BMI 30-39.9) 09/09/2022   Personal history of cecal colon cancer 09/09/2022    Family History  Problem Relation Age of Onset   Brain cancer Mother    Alzheimer's disease Father    Cancer Maternal Grandmother    Heart attack Maternal Grandfather    Pneumonia Paternal Grandfather     Past Surgical History:  Procedure Laterality Date   AMPUTATION Left 11/03/2022   Procedure: BELOW KNEE  AMPUTATION, LEFT;  Surgeon: Nadara Mustard, MD;  Location[facility];  Service: Orthopedics;  Laterality: Left;   APPENDECTOMY  APPLICATION OF WOUND VAC Left 11/03/2022   Procedure: APPLICATION OF WOUND VAC;  Surgeon: Nadara Mustard, MD;  Location: [facility];  Service: Orthopedics;  Laterality: Left;   CHOLECYSTECTOMY N/A 09/11/2022   Procedure: LAPAROSCOPIC CHOLECYSTECTOMY;  Surgeon: Abigail Miyamoto, MD;  [facility];  Service: General;  Laterality: N/A;   COLON RESECTION N/A 11/22/2016   Procedure: HAND ASSISTED LAPAROSCOPIC COLON RESECTION;  Surgeon: Avel Peace, MD;  Location: [facility];  Service: General;  Laterality: N/A;   COLONOSCOPY N/A 11/17/2016   Procedure: COLONOSCOPY;  Surgeon: Meryl Dare, MD;  [facility];  Service: Endoscopy;  Laterality: N/A;   FRACTURE SURGERY     MULTIPLE TOOTH EXTRACTIONS     RECTAL EXAM UNDER ANESTHESIA N/A 12/05/2016   Procedure: RECTAL EXAM UNDER ANESTHESIA, DISIMPACTION;  Surgeon: Ovidio Kin, MD;  Location[facility];  Service: General;  Laterality: N/A;   Social History   Occupational History   Occupation: unemployed  Tobacco Use   Smoking status: Former    Types: Pipe    Quit date: 09/14/1983    Years since quitting: 39.6   Smokeless tobacco: Never  Substance and Sexual Activity   Alcohol use: No   Drug use: No   Sexual activity: Never    Birth control/protection: Condom

## 2023-05-11 ENCOUNTER — Ambulatory Visit
Admission: RE | Admit: 2023-05-11 | Discharge: 2023-05-11 | Disposition: A | Discharge status: Home / Self Care | Payer: Medicare HMO | Source: Ambulatory Visit | Attending: Nurse Practitioner | Admitting: Nurse Practitioner

## 2023-05-11 DIAGNOSIS — I890 Lymphedema, not elsewhere classified: Secondary | ICD-10-CM

## 2023-05-12 ENCOUNTER — Other Ambulatory Visit: Payer: Self-pay | Admitting: Nurse Practitioner

## 2023-05-12 DIAGNOSIS — I890 Lymphedema, not elsewhere classified: Secondary | ICD-10-CM

## 2023-05-24 ENCOUNTER — Encounter (HOSPITAL_BASED_OUTPATIENT_CLINIC_OR_DEPARTMENT_OTHER): Payer: 59 | Attending: Internal Medicine | Admitting: Internal Medicine

## 2023-05-24 DIAGNOSIS — I890 Lymphedema, not elsewhere classified: Secondary | ICD-10-CM | POA: Insufficient documentation

## 2023-05-24 DIAGNOSIS — E1151 Type 2 diabetes mellitus with diabetic peripheral angiopathy without gangrene: Secondary | ICD-10-CM | POA: Insufficient documentation

## 2023-05-24 DIAGNOSIS — L97822 Non-pressure chronic ulcer of other part of left lower leg with fat layer exposed: Secondary | ICD-10-CM | POA: Insufficient documentation

## 2023-05-24 DIAGNOSIS — E1140 Type 2 diabetes mellitus with diabetic neuropathy, unspecified: Secondary | ICD-10-CM | POA: Insufficient documentation

## 2023-05-24 DIAGNOSIS — E1122 Type 2 diabetes mellitus with diabetic chronic kidney disease: Secondary | ICD-10-CM | POA: Insufficient documentation

## 2023-05-24 DIAGNOSIS — E11622 Type 2 diabetes mellitus with other skin ulcer: Secondary | ICD-10-CM | POA: Insufficient documentation

## 2023-05-24 DIAGNOSIS — I129 Hypertensive chronic kidney disease with stage 1 through stage 4 chronic kidney disease, or unspecified chronic kidney disease: Secondary | ICD-10-CM | POA: Diagnosis not present

## 2023-05-24 DIAGNOSIS — N183 Chronic kidney disease, stage 3 unspecified: Secondary | ICD-10-CM | POA: Insufficient documentation

## 2023-05-24 DIAGNOSIS — I872 Venous insufficiency (chronic) (peripheral): Secondary | ICD-10-CM | POA: Diagnosis not present

## 2023-05-24 DIAGNOSIS — I4891 Unspecified atrial fibrillation: Secondary | ICD-10-CM | POA: Insufficient documentation

## 2023-05-24 DIAGNOSIS — Z89512 Acquired absence of left leg below knee: Secondary | ICD-10-CM | POA: Insufficient documentation

## 2023-05-24 DIAGNOSIS — M1990 Unspecified osteoarthritis, unspecified site: Secondary | ICD-10-CM | POA: Insufficient documentation

## 2023-05-24 DIAGNOSIS — I87323 Chronic venous hypertension (idiopathic) with inflammation of bilateral lower extremity: Secondary | ICD-10-CM | POA: Diagnosis not present

## 2023-05-24 NOTE — Progress Notes (Signed)
DORSEL, MONTEIRO ([id number]) [id number] Page 1 of 8 Visit Report for 05/24/2023 Allergy List Details Patient Name: Date of Service: [name], [name]. 05/24/2023 8:00 A M Medical Record Number: [id number] Patient Account Number: [id number] Date of Birth/Sex: Treating RN: 1951/08/13 (72 y.o. [name] Primary Care [name]: [name] NDI Other Clinician: Referring [name]: Treating [name]/Extender: [name] in Treatment: 0 Allergies Active Allergies No Known Allergies Allergy Notes Electronic Signature(s) Signed: 05/24/2023 7:50:28 AM By: [name] Entered By: [name] on 05/23/2023 16:37:45 -------------------------------------------------------------------------------- Arrival Information Details Patient Name: Date of Service: [name], [name]. 05/24/2023 8:00 A M Medical Record Number: [id number] Patient Account Number: [id number] Date of Birth/Sex: Treating RN: 12/29/1950 (72 y.o. [name] Primary Care [name]: [name] NDI Other Clinician: Referring [name]: Treating [name]/Extender: [name] in Treatment: 0 Visit Information Patient Arrived: Wheel Chair Arrival Time: 08:19 Accompanied By: self Transfer Assistance: None Patient Identification Verified: Yes Secondary Verification Process Completed: Yes Patient Requires Transmission-Based Precautions: No Patient Has Alerts: No History Since Last Visit All ordered tests and consults were completed: Yes Added or deleted any medications: No Any new allergies or adverse reactions: No Had a fall or experienced change in activities of daily living that may affect risk of falls: No Signs or symptoms of abuse/neglect since last visito No Hospitalized since last visit: No Implantable device outside of the clinic excluding cellular tissue based products placed in the center since last visit: No Electronic  Signature(s) Signed: 05/24/2023 4:39:58 PM By: [name] Entered By: [name] on 05/24/2023 08:21:53 -------------------------------------------------------------------------------- Clinic Level of Care Assessment Details Patient Name: Date of Service: [name], [name]. 05/24/2023 8:00 A M Medical Record Number: [id number] Patient Account Number: [id number] Date of Birth/Sex: Treating RN: 05-Apr-1951 (72 y.o. [name] Primary Care Maelie Chriswell: [name] NDI Other Clinician: Referring [name]: Treating [name]/Extender: [name] in Treatment: 0 Clinic Level of Care Assessment Items TOOL 1 Quantity Score X- 1 0 Use when EandM and Procedure is performed on INITIAL visit KANIEL, BOTLEY ([id number]) [id number] Page 2 of 8 ASSESSMENTS - Nursing Assessment / Reassessment X- 1 20 General Physical Exam (combine w/ comprehensive assessment (listed just below) when performed on new pt. evals) X- 1 25 Comprehensive Assessment (HX, ROS, Risk Assessments, Wounds Hx, etc.) ASSESSMENTS - Wound and Skin Assessment / Reassessment X- 1 10 Dermatologic / Skin Assessment (not related to wound area) ASSESSMENTS - Ostomy and/or Continence Assessment and Care []  - 0 Incontinence Assessment and Management []  - 0 Ostomy Care Assessment and Management (repouching, etc.) PROCESS - Coordination of Care X - Simple Patient / Family Education for ongoing care 1 15 []  - 0 Complex (extensive) Patient / Family Education for ongoing care []  - 0 Staff obtains Chiropractor, Records, T Results / Process Orders est X- 1 10 Staff telephones HHA, Nursing Homes / Clarify orders / etc []  - 0 Routine Transfer to another Facility (non-emergent condition) []  - 0 Routine Hospital Admission (non-emergent condition) []  - 0 New Admissions / Manufacturing engineer / Ordering NPWT Apligraf, etc. , []  - 0 Emergency Hospital Admission (emergent  condition) PROCESS - Special Needs []  - 0 Pediatric / Minor Patient Management []  - 0 Isolation Patient Management []  - 0 Hearing / Language / Visual special needs []  - 0 Assessment of Community assistance (transportation, D/C planning, etc.) []  - 0 Additional assistance / Altered mentation []  -  0 Support Surface(s) Assessment (bed, cushion, seat, etc.) INTERVENTIONS - Miscellaneous []  - 0 External ear exam []  - 0 Patient Transfer (multiple staff / Nurse, adult / Similar devices) []  - 0 Simple Staple / Suture removal (25 or less) []  - 0 Complex Staple / Suture removal (26 or more) []  - 0 Hypo/Hyperglycemic Management (do not check if billed separately) X- 1 15 Ankle / Brachial Index (ABI) - do not check if billed separately Has the patient been seen at the hospital within the last three years: Yes Total Score: 95 Level Of Care: New/Established - Level 3 Electronic Signature(s) Signed: 05/24/2023 4:39:58 PM By: [name] Entered By: [name] on 05/24/2023 12:25:09 -------------------------------------------------------------------------------- Encounter Discharge Information Details Patient Name: Date of Service: [name], [name]. 05/24/2023 8:00 A M Medical Record Number: [id number] Patient Account Number: [id number] Date of Birth/Sex: Treating RN: 1951/10/23 (72 y.o. [name] Primary Care [name]: [name] NDI Other Clinician: Referring [name]: Treating [name]/Extender: [name] in Treatment: 0 Encounter Discharge Information Items Post Procedure Vitals Discharge Condition: Stable Temperature (F): 98 Ambulatory Status: Wheelchair Pulse (bpm): 78 Discharge Destination: Skilled Nursing Facility Respiratory Rate (breaths/min): 18 Telephoned: No Blood Pressure (mmHg): 132/78 [name], [name] ([id number]) [id number] Page 3 of 8 Orders Sent: Yes Transportation: Private Auto Accompanied By:  self Schedule Follow-up Appointment: Yes Clinical Summary of Care: Patient Declined Electronic Signature(s) Signed: 05/24/2023 4:39:58 PM By: [name] Entered By: [name] on 05/24/2023 09:48:12 -------------------------------------------------------------------------------- Lower Extremity Assessment Details Patient Name: Date of Service: [name], [name]. 05/24/2023 8:00 A M Medical Record Number: [id number] Patient Account Number: [id number] Date of Birth/Sex: Treating RN: 05-28-1951 (72 y.o. [name] Primary Care Skyann Ganim: [name] NDI Other Clinician: Referring Maryruth Apple: Treating Keionte Swicegood/Extender: [name] in Treatment: 0 Edema Assessment Assessed: [Left: No] [Right: No] [Left: Edema] [Right: :] Calf Left: Right: Point of Measurement: From Medial Instep 39 cm Ankle Left: Right: Point of Measurement: From Medial Instep 23.8 cm Vascular Assessment Pulses: Dorsalis Pedis Palpable: [Right:Yes] Blood Pressure: Brachial: [Right:131] Ankle: [Right:Dorsalis Pedis: 110 0.84] Electronic Signature(s) Signed: 05/24/2023 4:39:58 PM By: [name] Entered By: [name] on 05/24/2023 08:39:13 -------------------------------------------------------------------------------- Multi Wound Chart Details Patient Name: Date of Service: [name], [name]. 05/24/2023 8:00 A M Medical Record Number: [id number] Patient Account Number: [id number] Date of Birth/Sex: Treating RN: [birth year] (72 y.o. M) Primary Care Kalei Mckillop: [name] NDI Other Clinician: Referring Taron Conrey: Treating Jheremy Boger/Extender: [name] in Treatment: 0 Vital Signs Height(in): Capillary Blood Glucose(mg/dl): 259 Weight(lbs): Pulse(bpm): 66 Body Mass Index(BMI): Blood Pressure(mmHg): 131/69 Temperature(F): 97.9 Respiratory Rate(breaths/min): 18 [14:Photos:] [15:No Photos] [N/A:N/A [id number] Page 4 of  8] Left Amputation Site - Below Knee Left, Proximal, Medial Amputation Site N/A Wound Location: - Below Knee Blister Blister N/A Wounding Event: Diabetic Wound/Ulcer of the Lower Diabetic Wound/Ulcer of the Lower N/A Primary Etiology: Extremity Extremity Cataracts, Lymphedema, Arrhythmia, N/A N/A Comorbid History: Peripheral Venous Disease, Type II Diabetes, Osteoarthritis, Neuropathy 05/11/2023 04/13/2023 N/A Date Acquired: 0 0 N/A Reese of Treatment: Open Open N/A Wound Status: No No N/A Wound Recurrence: 2.5x2x0.1 2x2x0.1 N/A Measurements Reese x W x D (cm) 3.927 3.142 N/A A (cm) : rea 0.393 0.314 N/A Volume (cm) : Grade 1 N/A N/A Classification: Medium N/A N/A Exudate A mount: Serosanguineous N/A N/A Exudate Type: red, brown N/A N/A Exudate Color: Distinct, outline attached N/A N/A Wound Margin: Medium (34-66%) N/A N/A  Granulation A mount: Red, Pink N/A N/A Granulation Quality: Medium (34-66%) N/A N/A Necrotic A mount: Eschar, Adherent Slough N/A N/A Necrotic Tissue: Fat Layer (Subcutaneous Tissue): Yes N/A N/A Exposed Structures: Debridement - Selective/Open Wound Debridement - Selective/Open Wound N/A Debridement: Pre-procedure Verification/Time Out 09:20 09:20 N/A Taken: Lidocaine 4% Topical Solution Lidocaine 4% Topical Solution N/A Pain Control: Skin/Epidermis Skin/Epidermis N/A Level: 3.92 3.14 N/A Debridement A (sq cm): rea Curette Curette N/A Instrument: Minimum Minimum N/A Bleeding: Pressure Pressure N/A Hemostasis A chieved: 0 0 N/A Procedural Pain: 0 0 N/A Post Procedural Pain: Procedure was tolerated well Procedure was tolerated well N/A Debridement Treatment Response: 2.5x2x0.1 2x2x0.1 N/A Post Debridement Measurements Reese x W x D (cm) 0.393 0.314 N/A Post Debridement Volume: (cm) Scarring: Yes No Abnormalities Noted N/A Periwound Skin Texture: Dry/Scaly: Yes No Abnormalities Noted N/A Periwound Skin Moisture: Maceration:  No Hemosiderin Staining: Yes No Abnormalities Noted N/A Periwound Skin Color: No Abnormality N/A N/A Temperature: Yes N/A N/A Tenderness on Palpation: Debridement Debridement N/A Procedures Performed: Treatment Notes Electronic Signature(s) Signed: 05/24/2023 3:34:20 PM By: Geralyn Corwin DO Entered By: Geralyn Corwin on 05/24/2023 09:40:03 -------------------------------------------------------------------------------- Multi-Disciplinary Care Plan Details Patient Name: Date of Service: [name], [name]. 05/24/2023 8:00 A M Medical Record Number: [id number] Patient Account Number: [id number] Date of Birth/Sex: Treating RN: 03/03/1951 (72 y.o. [name] Primary Care Felder Lebeda: [name] NDI Other Clinician: Referring Ortencia Askari: Treating Seon Gaertner/Extender: [name] in Treatment: 0 Active Inactive Wound/Skin Impairment [name], [name] ([id number]) [id number] Page 5 of 8 Nursing Diagnoses: Knowledge deficit related to smoking impact on wound healing Goals: Patient/caregiver will verbalize understanding of skin care regimen Date Initiated: 05/24/2023 Target Resolution Date: 07/13/2023 Goal Status: Active Interventions: Assess patient/caregiver ability to obtain necessary supplies Assess patient/caregiver ability to perform ulcer/skin care regimen upon admission and as needed Assess ulceration(s) every visit Provide education on ulcer and skin care Screen for HBO Treatment Activities: Skin care regimen initiated : 05/24/2023 Topical wound management initiated : 05/24/2023 Notes: Electronic Signature(s) Signed: 05/24/2023 4:39:58 PM By: [name] Entered By: [name] on 05/24/2023 08:48:14 -------------------------------------------------------------------------------- Pain Assessment Details Patient Name: Date of Service: [name], [name]. 05/24/2023 8:00 A M Medical Record Number: [id number] Patient  Account Number: [id number] Date of Birth/Sex: Treating RN: 12/28/1950 (72 y.o. [name] Primary Care Sanskriti Greenlaw: [name] NDI Other Clinician: Referring Kinsley Holderman: Treating Samantha Olivera/Extender: [name] in Treatment: 0 Active Problems Location of Pain Severity and Description of Pain Patient Has Paino No Site Locations Pain Management and Medication Current Pain Management: Electronic Signature(s) Signed: 05/24/2023 4:39:58 PM By: [name] Entered By: [name] on 05/24/2023 08:47:04 Patient/Caregiver Education Details -------------------------------------------------------------------------------- [name] ([id number]) [id number] Page 6 of 8 Patient Name: Date of Service: [name] 6/11/2024andnbsp8:00 A M Medical Record Number: [id number] Patient Account Number: [id number] Date of Birth/Gender: Treating RN: [birth year] (72 y.o. [name] Primary Care Physician: [name] NDI Other Clinician: Referring Physician: Treating Physician/Extender: [name] in Treatment: 0 Education Assessment Education Provided To: Patient Education Topics Provided Wound/Skin Impairment: Methods: Explain/Verbal Responses: State content correctly Electronic Signature(s) Signed: 05/24/2023 4:39:58 PM By: [name] Entered By: [name] on 05/24/2023 08:48:28 -------------------------------------------------------------------------------- Wound Assessment Details Patient Name: Date of Service: [name], [name]. 05/24/2023 8:00 A M Medical Record Number: [id number] Patient Account Number: [id number] Date of Birth/Sex: Treating RN: 1951/09/03 (72 y.o. [name]  Primary Care Ryana Montecalvo: [name] NDI Other Clinician: Referring Gerik Coberly: Treating Delta Deshmukh/Extender: [name] in Treatment: 0 Wound Status Wound Number: 14 Primary Diabetic  Wound/Ulcer of the Lower Extremity Etiology: Wound Location: Left Amputation Site - Below Knee Wound Open Wounding Event: Blister Status: Date Acquired: 05/11/2023 Comorbid Cataracts, Lymphedema, Arrhythmia, Peripheral Venous Disease, Reese Of Treatment: 0 History: Type II Diabetes, Osteoarthritis, Neuropathy Clustered Wound: No Photos Wound Measurements Length: (cm) 2.5 Width: (cm) 2 Depth: (cm) 0.1 Area: (cm) 3.927 Volume: (cm) 0.393 % Reduction in Area: % Reduction in Volume: Tunneling: No Undermining: No Wound Description Classification: Grade 1 Wound Margin: Distinct, outline attached Exudate Amount: Medium Exudate Type: Serosanguineous Exudate Color: red, brown Foul Odor After Cleansing: No Slough/Fibrino Yes Wound Bed [name], [name] ([id number]) [id number] Page 7 of 8 Granulation Amount: Medium (34-66%) Exposed Structure Granulation Quality: Red, Pink Fat Layer (Subcutaneous Tissue) Exposed: Yes Necrotic Amount: Medium (34-66%) Necrotic Quality: Eschar, Adherent Slough Periwound Skin Texture Texture Color No Abnormalities Noted: No No Abnormalities Noted: No Scarring: Yes Hemosiderin Staining: Yes Moisture Temperature / Pain No Abnormalities Noted: No Temperature: No Abnormality Dry / Scaly: Yes Tenderness on Palpation: Yes Maceration: No Treatment Notes Wound #14 (Amputation Site - Below Knee) Wound Laterality: Left Cleanser Vashe 5.8 (oz) Discharge Instruction: Cleanse the wound with Vashe prior to applying a clean dressing using gauze sponges, not tissue or cotton balls. Peri-Wound Care Topical Primary Dressing Hydrofera Blue Ready Transfer Foam, 4x5 (in/in) Discharge Instruction: Apply to wound bed as instructed three times a day Secondary Dressing Secured With Compression Wrap Tubigrip DD Discharge Instruction: Wear until Juxtalites come in. May wash as documented above. Compression Stockings Add-Ons Electronic  Signature(s) Signed: 05/24/2023 4:39:58 PM By: [name] Signed: 05/24/2023 4:49:20 PM By: Redmond Pulling RN, BSN Entered By: Redmond Pulling on 05/24/2023 08:46:43 -------------------------------------------------------------------------------- Wound Assessment Details Patient Name: Date of Service: [name], [name]. 05/24/2023 8:00 A M Medical Record Number: [id number] Patient Account Number: [id number] Date of Birth/Sex: Treating RN: [birth year] (71 y.o. [name] Primary Care Paulla Mcclaskey: [name] NDI Other Clinician: Referring Jolisa Intriago: Treating Ilithyia Titzer/Extender: [name] in Treatment: 0 Wound Status Wound Number: 15 Primary Etiology: Diabetic Wound/Ulcer of the Lower Extremity Wound Location: Left, Proximal, Medial Amputation Site - Below Knee Wound Status: Open Wounding Event: Blister Date Acquired: 04/13/2023 Reese Of Treatment: 0 Clustered Wound: No Wound Measurements Length: (cm) Width: (cm) Depth: (cm) Area: (cm) Volume: (cm) 2 % Reduction in Area: 2 % Reduction in Volume: 0.1 3.142 0.314 Periwound Skin Texture [name], [name] ([id number]) [id number] Page 8 of 8 Texture Color No Abnormalities Noted: No No Abnormalities Noted: No Moisture No Abnormalities Noted: No Treatment Notes Wound #15 (Amputation Site - Below Knee) Wound Laterality: Left, Medial, Proximal Cleanser Peri-Wound Care Topical Primary Dressing Secondary Dressing Secured With Compression Wrap Compression Stockings Add-Ons Electronic Signature(s) Signed: 05/24/2023 4:39:58 PM By: [name] Entered By: [name] on 05/24/2023 09:21:26 -------------------------------------------------------------------------------- Vitals Details Patient Name: Date of Service: [name], [name]. 05/24/2023 8:00 A M Medical Record Number: [id number] Patient Account Number: [id number] Date of Birth/Sex: Treating RN: 08/05/1951 (72  y.o. [name] Primary Care Vielka Klinedinst: [name] NDI Other Clinician: Referring Cristino Degroff: Treating Ryna Beckstrom/Extender: [name] in Treatment: 0 Vital Signs Time Taken: 08:21 Temperature (F): 97.9 Pulse (bpm): 66 Respiratory Rate (breaths/min): 18 Blood Pressure (mmHg): 131/69 Capillary Blood Glucose (mg/dl): 841 Reference Range: 80 - 120 mg / dl  Electronic Signature(s) Signed: 05/24/2023 4:39:58 PM By: [name] Entered By: [name] on 05/24/2023 08:22:26

## 2023-05-25 NOTE — Progress Notes (Signed)
[name] ([id number]) [phone] [id number] Page 1 of 4 Visit Report for 05/24/2023 Abuse Risk Screen Details Patient Name: Date of Service: [name], Allen [name]. 05/24/2023 8:00 A M Medical Record Number: [id number] Patient Account Number: [id number] Date of Birth/Sex: Treating RN: Apr 07, 1951 (72 y.o. [name]: [name] Other Clinician: Referring [name]: Treating [name] in Treatment: 0 Abuse Risk Screen Items Answer ABUSE RISK SCREEN: Has anyone close to you tried to hurt or harm you recentlyo No Do you feel uncomfortable with anyone in your familyo No Has anyone forced you do things that you didnt want to doo No Electronic Signature(s) Signed: 05/24/2023 4:39:58 PM By: [name] Entered By: [name] on 05/24/2023 08:24:18 -------------------------------------------------------------------------------- Activities of Reese Living Details Patient Name: Date of Service: [name], Allen [name]. 05/24/2023 8:00 A M Medical Record Number: [id number] Patient Account Number: [id number] Date of Birth/Sex: Treating RN: 1951-06-29 (72 y.o. [name]: [name] Other Clinician: Referring [name]: Treating [name] in Treatment: 0 Activities of Reese Living Items Answer Activities of Reese Living (Please select one for each item) Drive Automobile Not Able T Medications ake Completely Able Use T elephone Completely Able Reese for Appearance Completely Able Use T oilet Completely Able Bath / Shower Need Assistance Dress Self Completely Able Feed Self Completely Able Walk Completely Able Get In / Out Bed Completely Able Housework Completely Able Prepare Meals Completely Able Handle Money Completely Able Shop for Self Completely Able Electronic Signature(s) Signed: 05/24/2023 4:39:58 PM By:  [name] Entered By: [name] on 05/24/2023 08:25:02 -------------------------------------------------------------------------------- Education Screening Details Patient Name: Date of Service: [name], Allen [name]. 05/24/2023 8:00 A M Medical Record Number: [id number] Patient Account Number: [id number] Date of Birth/Sex: Treating RN: 1951-08-11 (72 y.o. [name] Jayla Mackie: [name] Other Clinician: Referring [name]: Treating [name] in Treatment: [address] TUDOR, CHANDLEY ([id number]) [id number] Page 2 of 4 Learning Preferences/Education Level/Primary Language Highest Education Level: High School Preferred Language: Economist Language Barrier: No Translator Needed: No Memory Deficit: No Emotional Barrier: No Cultural/Religious Beliefs Affecting Medical Reese: No Physical Barrier Impaired Vision: Yes Glasses Impaired Hearing: No Decreased Hand dexterity: No Knowledge/Comprehension Knowledge Level: High Comprehension Level: High Ability to understand written instructions: High Ability to understand verbal instructions: High Motivation Anxiety Level: Calm Cooperation: Cooperative Education Importance: Acknowledges Need Interest in Health Problems: Asks Questions Perception: Coherent Willingness to Engage in Self-Management High Activities: Readiness to Engage in Self-Management High Activities: Electronic Signature(s) Signed: 05/24/2023 4:39:58 PM By: [name] Entered By: [name] on 05/24/2023 08:25:54 -------------------------------------------------------------------------------- Fall Risk Assessment Details Patient Name: Date of Service: [name], Allen [name]. 05/24/2023 8:00 A M Medical Record Number: [id number] Patient Account Number: [id number] Date of Birth/Sex: Treating RN: [birth year] (72 y.o. [name]  [name]: [name] Other Clinician: Referring [name]: Treating Texanna Hilburn/Extender: [name] in Treatment: 0 Fall Risk Assessment Items Have you had 2 or more falls in the last 12 monthso 0 No Have you had any fall that resulted in injury in the last 12 monthso 0 No FALLS RISK SCREEN History of falling - immediate or within 3 months 0 No Secondary diagnosis (Do you have 2 or more medical diagnoseso) 0 No Ambulatory aid None/bed rest/wheelchair/nurse 0 No Crutches/cane/walker 0  No Furniture 0 No Intravenous therapy Access/Saline/Heparin Lock 0 No Gait/Transferring Normal/ bed rest/ wheelchair 0 No Weak (short steps with or without shuffle, stooped but able to lift head while walking, may seek 0 No support from furniture) Impaired (short steps with shuffle, may have difficulty arising from chair, head down, impaired 0 No balance) Mental Status Oriented to own ability 0 No Electronic Signature(s) [name] ([id number]) [id number] Page 3 of 4 Signed: 05/24/2023 4:39:58 PM By: [name] Entered By: [name] on 05/24/2023 08:26:06 -------------------------------------------------------------------------------- Foot Assessment Details Patient Name: Date of Service: [name], Allen [name]. 05/24/2023 8:00 A M Medical Record Number: [id number] Patient Account Number: [id number] Date of Birth/Sex: Treating RN: [birth year] (72 y.o. [name] [name]: [name] Other Clinician: Referring [name]: Treating [name]/Extender: [name] in Treatment: 0 Foot Assessment Items Site Locations + = Sensation present, - = Sensation absent, C = Callus, U = Ulcer R = Redness, W = Warmth, M = Maceration, PU = Pre-ulcerative lesion F = Fissure, S = Swelling, D = Dryness Assessment Right: Left: Other Deformity: No No Prior Foot Ulcer: No No Prior Amputation: No  No Charcot Joint: No No Ambulatory Status: Non-ambulatory Assistance Device: Wheelchair Gait: Surveyor, mining) Signed: 05/24/2023 4:39:58 PM By: [name] Entered By: [name] on 05/24/2023 08:37:34 -------------------------------------------------------------------------------- Nutrition Risk Screening Details Patient Name: Date of Service: [name], Allen [name]. 05/24/2023 8:00 A M Medical Record Number: [id number] Patient Account Number: [id number] Date of Birth/Sex: Treating RN: 1951/10/07 (72 y.o. [name] Virga Haltiwanger: [name] Other Clinician: Referring Verlin Uher: Treating Jasmeen Fritsch/Extender: Cherylynn Ridges Weeks in Treatment: 0 Height (in): Weight (lbs): Body Mass Index (BMI): DREXEL, VELARDO Reese ([id number]) [id number] Page 4 of 4 Nutrition Risk Screening Items Score Screening NUTRITION RISK SCREEN: I have an illness or condition that made me change the kind and/or amount of food I eat 0 No I eat fewer than two meals per day 0 No I eat few fruits and vegetables, or milk products 0 No I have three or more drinks of beer, liquor or wine almost every day 0 No I have tooth or mouth problems that make it hard for me to eat 0 No I don't always have enough money to buy the food I need 0 No I eat alone most of the time 0 No I take three or more different prescribed or over-the-counter drugs a day 0 No Without wanting to, I have lost or gained 10 pounds in the last six months 0 No I am not always physically able to shop, cook and/or feed myself 0 No Nutrition Protocols Good Risk Protocol Moderate Risk Protocol High Risk Proctocol Risk Level: Good Risk Score: 0 Electronic Signature(s) Signed: 05/24/2023 4:39:58 PM By: [name] Entered By: [name] on 05/24/2023 08:26:13

## 2023-05-31 ENCOUNTER — Encounter (HOSPITAL_BASED_OUTPATIENT_CLINIC_OR_DEPARTMENT_OTHER): Payer: 59 | Admitting: Internal Medicine

## 2023-05-31 DIAGNOSIS — L97822 Non-pressure chronic ulcer of other part of left lower leg with fat layer exposed: Secondary | ICD-10-CM | POA: Diagnosis not present

## 2023-05-31 DIAGNOSIS — Z89512 Acquired absence of left leg below knee: Secondary | ICD-10-CM

## 2023-05-31 DIAGNOSIS — I87323 Chronic venous hypertension (idiopathic) with inflammation of bilateral lower extremity: Secondary | ICD-10-CM

## 2023-05-31 DIAGNOSIS — E11622 Type 2 diabetes mellitus with other skin ulcer: Secondary | ICD-10-CM | POA: Diagnosis not present

## 2023-05-31 NOTE — Progress Notes (Signed)
ABISHEK, RUBACK ([id number]) [id number] Page 1 of 10 Visit Report for 05/31/2023 Chief Complaint Document Details Patient Name: Date of Service: [name], Allen [name]. 05/31/2023 11:00 A M Medical Record Number: [id number] Patient Account Number: [id number] Date of Birth/Sex: Treating RN: 02-03-1951 (72 y.o. M) Primary Care Provider: [name] NDI Other Clinician: Referring Provider: Treating Provider/Extender: Cannon Kettle, BRA NDI Weeks in Treatment: 1 Information Obtained from: Patient Chief Complaint 02/03/2022: The patient is here today for reevaluation of bilateral lower extremity edema and venous stasis dermatitis Electronic Signature(s) Signed: 05/31/2023 3:45:24 PM By: Geralyn Corwin DO Entered By: Geralyn Corwin on 05/31/2023 11:33:11 -------------------------------------------------------------------------------- HPI Details Patient Name: Date of Service: [name], Allen [name]. 05/31/2023 11:00 A M Medical Record Number: [id number] Patient Account Number: [id number] Date of Birth/Sex: Treating RN: 07-Aug-1951 (72 y.o. M) Primary Care Provider: [name] NDI Other Clinician: Referring Provider: Treating Provider/Extender: Geralyn Corwin HA RRIS, BRA NDI Weeks in Treatment: 1 History of Present Illness HPI Description: [name] is a 72 year old male with a past medical history of type 2 diabetes and essential hypertension who presents to the clinic today for bilateral lower extremity wounds. He was recently seen in the ED on 3/9 for bilateral lower extremity edema. He had a DVT study that was negative for clots. He was given triamcinolone cream and advised to elevate legs and obtain compression stockings. He has been using the triamcinolone cream however he is unable to use compression stockings due to difficulty of putting on. Patient states that for the past 2 years he has had opening and closing of weeping wounds to his legs  bilaterally. They spontaneously open and close. They are often open for several weeks before they heal spontaneously. He denies any purulent drainage, increased warmth or increased erythema to the skin. 4/21; patient presents for 1 week follow-up. The wounds on the right leg have healed however he has now 2 new wounds 1 on the anterior and posterior thigh that occurred from him scratching underneath the wrap. The left leg wound has improved and almost closed 4/28; patient presents for 1 week follow-up. He continues to have a wound on the right anterior leg that is showing signs of healing. The posterior right thigh has closed. He now has a new wound to the left leg that is limited to skin breakdown. 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 The wounds are occurring because he goes underneath the leg wraps and scratches creating new wounds. He has no complaints today. 5/5; patient presents for 1 week follow-up. He no longer has any open wounds to his lower extremities bilaterally. He continues to have scattered excoriation marks from where he scratches. These are scabbed over. He states he is going to see dermatology this month. Overall he is doing well and is happy with his care. Readmission 6/10 Patient was followed for open wounds limited to skin breakdown to his lower extremities bilaterally 2/2 venous insufficiency. Once the wounds healed he was discharged with juxta lite compressions and these were placed in office. He was discharged on 5/5. He states that he has not taken the wraps off since discharge and has had them on for over a month. He did not want to take them off because they were very comfortable. He does not have any difficulty putting these on and off if he wanted to. He states that he does not take  a shower and just cleans the other parts of his body at the sink. He noticed that 2 days ago he was having some  drainage at the top of the wrap and called in to be evaluated. He states that he has not seen the wounds until the wraps were TERI, [facility] ([id number]) [id number] Page 2 of 10 taken off today in office. He denies pain or increased warmth or erythema to the area 6/27; patient presents for 2-week follow-up. He has had increased swelling and drainage to the wounds over the last week. He reports increased erythema to the area. He does not have pain however. He denies purulent drainage or fever/chills. 7/5; patient presents for 1 week follow-up. He was prescribed Keflex at last clinic visit and is currently taking this. He reports significant improvement to his leg swelling, tenderness and erythema. He denies signs of infection. 7/21; patient presents for 2-week follow-up. He reports improvement in healing in his right lower extremity. Unfortunately this morning he noticed skin breakdown to his left lower extremity with increased redness and warmth. 7/28; patient presents for 1 week follow-up. He reports completing his antibiotics. He states that his wounds are healed. He has been using his juxta light compressions. He has no issues or complaints today. Readmission 12/17/2021; This is a 72 year old man who has been seen twice by Dr. Mikey Bussing for wounds on his bilateral lower extremities secondary to chronic venous insufficiency. Most recently this was from 05/22/2021 through 07/09/2021 with bilateral lower extremity wounds largely secondary to chronic venous insufficiency with stasis dermatitis. He has been discharged with juxta lite stockings. The patient tells me that about a month ago he developed too much edema to get his juxta lites on. He developed increased swelling. He saw his primary physician who gave him Lasix and the swelling seems to have come down. 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 He says this dates  back into his adolescence and he could not walk barefoot etc. As a result of this he sometimes leaves his shoes and socks on for weeks and he is done that recently.[name] He arrives in clinic with not any wounds on his legs however with close inspection he had a fairly deep wound in the left second and third toe webspace His skin on his feet was not in good shape macerated flaking malodorous The patient's past medical history has not changed. I note that he had arterial studies done on 06/03/2021 that showed an ABI on the right of 1.26 with triphasic waveforms TBI of 0.63 on the right on the left ABI of 0.85 TBI of 0.63 again with triphasic waveforms 1/19; since the patient was last here he was seen in the hospital for bradycardia. He had his medications adjusted digoxin metoprolol. He tells me they dressed the wound between the left second and third toe. The patient says that he had open wounds on his dorsal foot when we took off the dressing today. I was not exactly sure what he had on their. He had 2 wounds on the left dorsal foot with some significant surrounding erythema.The wound between the left second and third toe was just about closed however when we opened it to examine it today it it opened and a small area. The skin on his lower legs especially the left looks a lot better using triamcinolone and Cetaphil which she is applying He was on cephalexin prior described I think by primary care before  he came here he completed this about 3 or 4 days ago 1/26; the area between the left second and third toes is healed. He still has 2 small areas on the left dorsal foot. The erythema that I marked around this last week seems better. He is completing his doxycycline. I also gave him triamcinolone and Cetaphil last week for the dry flaking irritated inflamed skin on his dorsal feet and lower legs this all seems better. He has been wearing his juxta lite stockings 2/2; everything is healed here. He is now  treating his skin with I think both the triamcinolone and Cetaphil mixture that I prescribed and probably pure triamcinolone that the dermatologist prescribed. I told him to use my compounded steroid cream as a moisture and anti-itch and inflammation cream when he is finished without to go to a pure moisturizer I emphasized that steroids under compression even if its a juxta lite stocking can get absorbed and secondarily can cause skin atrophy. I asked him not to use both. On an optimistic front he seems a lot more interested in treating his skin and watching for areas on his feet that are breaking down. His skin looks a lot better he has a juxta lite stocking 2/14; patient we discharged 2 weeks ago. He has noted increasing edema in the right greater than left leg erythema and intense pruritus. Arrives in clinic really with no open wound however considerable amount of marks on his upper right leg from scratching. He claims compliance with his juxta lites up with up to a week ago on the right. He has been wearing 1 on the left. He has home health nurse put kerlix on this 02/03/2022: No open wounds. He has been in 3 layer compression, which is slipped down and caused some "mushrooming" affect from just below the tibial tuberosity to the knee. The excoriations on his legs have resolved. He reports being compliant with moisturizing and elevation of his legs. He saw his cardiologist yesterday and was prescribed 20 mg of Lasix to be used on an as-needed basis. Readmission 06/01/2022 [name] is a 72 year old male with a past medical history of venous insufficiency, lymphedema, type 2 diabetes that presents to the clinic for a 1 month history of worsening leg wounds. He states he has not been wearing his compression wraps for the past 2 months. He states his Lasix was stopped by his primary doctor and then his legs became more swollen and he could not wear his compression stockings. He states  eventually he was put back on Lasix. He still has been unable to wear his compression stockings and although his swelling has improved slightly his wounds have not improved. He has increased warmth and erythema to the right lower extremity. 6/29; patient presents for follow-up. He completed his course of Keflex without issues. We have been using silver alginate with antibiotic ointment under compression therapy. He has no issues or complaints today. He has his juxta lite compressions with him today. 7/7; patient presents for follow-up. We have been using silver alginate under 3 layer compression. Patient has done well with this. His wounds have healed. He states he is going to get custom garment compression wraps. For now he has his juxta lite compressions. 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 He states that  he had a shrinker on and when taken off it peeled back the skin. He has been healing however has 2 open areas still present. He has been keeping the area covered. He also reports open wounds to the right lower extremity but these have healed with Ace wraps. He does have juxta lite compression Velcro wraps at home. He has home health that changes his dressings. He currently denies signs of infection. 6/18; patient presents for follow-up. We have been using Hydrofera Blue to the wound beds under Tubigrip. Wounds are smaller. Electronic Signature(s) Signed: 05/31/2023 3:45:24 PM By: Geralyn Corwin DO Entered By: Geralyn Corwin on 05/31/2023 11:33:56 Haskew, Rande Brunt ([id number]) [id number] Page 3 of 10 -------------------------------------------------------------------------------- Physical Exam Details Patient Name: Date of Service: [name], Allen [name].  05/31/2023 11:00 A M Medical Record Number: [id number] Patient Account Number: [id number] Date of Birth/Sex: Treating RN: 06-09-1951 (72 y.o. M) Primary Care Provider: [name] NDI Other Clinician: Referring Provider: Treating Provider/Extender: Geralyn Corwin HA RRIS, BRA NDI Weeks in Treatment: 1 Constitutional respirations regular, non-labored and within target range for patient.[name] Psychiatric pleasant and cooperative. Notes Left BKA stump: 2 open wounds with granulation tissue. No signs of infection. Electronic Signature(s) Signed: 05/31/2023 3:45:24 PM By: Geralyn Corwin DO Entered By: Geralyn Corwin on 05/31/2023 11:34:23 -------------------------------------------------------------------------------- Physician Orders Details Patient Name: Date of Service: [name], Allen [name]. 05/31/2023 11:00 A M Medical Record Number: [id number] Patient Account Number: [id number] Date of Birth/Sex: Treating RN: 1950-12-28 (72 y.o. [name] Primary Care Provider: [name] NDI Other Clinician: Referring Provider: Treating Provider/Extender: Cannon Kettle, BRA NDI Weeks in Treatment: 1 Verbal / Phone Orders: No Diagnosis Coding ICD-10 Coding Code Description [phone] Non-pressure chronic ulcer of other part of left lower leg with fat layer exposed E11.622 Type 2 diabetes mellitus with other skin ulcer Z89.512 Acquired absence of left leg below knee I87.323 Chronic venous hypertension (idiopathic) with inflammation of bilateral lower extremity I89.0 Lymphedema, not elsewhere classified Follow-up Appointments ppointment in 1 week. - Dr. Mikey Bussing 06/09/2023 2pm room 9 Thursday Return A Anesthetic (In clinic) Topical Lidocaine 4% applied to wound bed Bathing/ Shower/ Hygiene May shower and wash wound with soap and water. Edema Control - Lymphedema / SCD / Other Compression stocking or Garment 20-30 mm/Hg pressure to: Other Edema Control Orders/Instructions: -  Order Juxtalites 20/30. Wear Tubigrip until Juxtalites come in. Home Health No change in wound care orders this week; continue Home Health for wound care. May utilize formulary equivalent dressing for wound treatment orders unless otherwise specified. - x1 a week by home health and wound center weekly. Other Home Health Orders/Instructions: [facility] home health [name], [name] ([id number]) [id number] Page 4 of 10 Wound Treatment Wound #14 - Amputation Site - Below Knee Wound Laterality: Left Cleanser: Vashe 5.8 (oz) 2 x Per Week/30 Days Discharge Instructions: Cleanse the wound with Vashe prior to applying a clean dressing using gauze sponges, not tissue or cotton balls. Prim Dressing: Hydrofera Blue Ready Transfer Foam, 4x5 (in/in) 2 x Per Week/30 Days ary Discharge Instructions: Apply to wound bed as instructed three times a day Secondary Dressing: Zetuvit Plus Silicone Border Dressing 4x4 (in/in) (Home Health) 2 x Per Week/30 Days Discharge Instructions: Apply silicone border over primary dressing as directed. Compression Wrap: Tubigrip D 2 x Per Week/30 Days Discharge Instructions: Wear until Juxtalites come in. May wash as documented above. Wound #15 - Amputation Site - Below Knee Wound Laterality: Left, Medial, Proximal Cleanser:  Vashe 5.8 (oz) 2 x Per Week/30 Days Discharge Instructions: Cleanse the wound with Vashe prior to applying a clean dressing using gauze sponges, not tissue or cotton balls. Prim Dressing: Hydrofera Blue Ready Transfer Foam, 4x5 (in/in) 2 x Per Week/30 Days ary Discharge Instructions: Apply to wound bed as instructed three times a day Secondary Dressing: Zetuvit Plus Silicone Border Dressing 4x4 (in/in) (Home Health) 2 x Per Week/30 Days Discharge Instructions: Apply silicone border over primary dressing as directed. Compression Wrap: Tubigrip D 2 x Per Week/30 Days Discharge Instructions: Wear until Juxtalites come in. May wash  as documented above. Electronic Signature(s) Signed: 05/31/2023 3:45:24 PM By: Geralyn Corwin DO Entered By: Geralyn Corwin on 05/31/2023 11:34:31 -------------------------------------------------------------------------------- Problem List Details Patient Name: Date of Service: [name], Allen [name]. 05/31/2023 11:00 A M Medical Record Number: [id number] Patient Account Number: [id number] Date of Birth/Sex: Treating RN: [birth year] (72 y.o. [name] Primary Care Provider: [name] NDI Other Clinician: Referring Provider: Treating Provider/Extender: Galen Manila NDI Weeks in Treatment: 1 Active Problems ICD-10 Encounter Code Description Active Date MDM Diagnosis L97.822 Non-pressure chronic ulcer of other part of left lower leg with fat layer exposed6/10/2023 No Yes E11.622 Type 2 diabetes mellitus with other skin ulcer 05/24/2023 No Yes Z89.512 Acquired absence of left leg below knee 05/24/2023 No Yes I87.323 Chronic venous hypertension (idiopathic) with inflammation of bilateral lower 05/24/2023 No Yes extremity I89.0 Lymphedema, not elsewhere classified 05/24/2023 No Yes [name], [name] ([id number]) [id number] Page 5 of 10 Inactive Problems Resolved Problems Electronic Signature(s) Signed: 05/31/2023 3:45:24 PM By: Geralyn Corwin DO Entered By: Geralyn Corwin on 05/31/2023 11:32:29 -------------------------------------------------------------------------------- Progress Note Details Patient Name: Date of Service: [name], Allen [name]. 05/31/2023 11:00 A M Medical Record Number: [id number] Patient Account Number: [id number] Date of Birth/Sex: Treating RN: November 14, 1951 (72 y.o. M) Primary Care Provider: [name] NDI Other Clinician: Referring Provider: Treating Provider/Extender: Cannon Kettle, BRA NDI Weeks in Treatment: 1 Subjective Chief Complaint Information obtained from Patient 02/03/2022: The patient  is here today for reevaluation of bilateral lower extremity edema and venous stasis dermatitis History of Present Illness (HPI) [name] is a 72 year old male with a past medical history of type 2 diabetes and essential hypertension who presents to the clinic today for bilateral lower extremity wounds. He was recently seen in the ED on 3/9 for bilateral lower extremity edema. He had a DVT study that was negative for clots. He was given triamcinolone cream and advised to elevate legs and obtain compression stockings. He has been using the triamcinolone cream however he is unable to use compression stockings due to difficulty of putting on. Patient states that for the past 2 years he has had opening and closing of weeping wounds to his legs bilaterally. They spontaneously open and close. They are often open for several weeks before they heal spontaneously. He denies any purulent drainage, increased warmth or increased erythema to the skin. 4/21; patient presents for 1 week follow-up. The wounds on the right leg have healed however he has now 2 new wounds 1 on the anterior and posterior thigh that occurred from him scratching underneath the wrap. The left leg wound has improved and almost closed 4/28; patient presents for 1 week follow-up. He continues to have a wound on the right anterior leg that is showing signs of healing. The posterior right thigh has closed. He now has a new wound to the left leg that is limited  to skin breakdown. 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 The wounds are occurring because he goes underneath the leg wraps and scratches creating new wounds. He has no complaints today. 5/5; patient presents for 1 week follow-up. He no longer has any open wounds to his lower extremities bilaterally. He continues to have scattered excoriation marks from where he scratches. These are scabbed over. He states he  is going to see dermatology this month. Overall he is doing well and is happy with his care. Readmission 6/10 Patient was followed for open wounds limited to skin breakdown to his lower extremities bilaterally 2/2 venous insufficiency. Once the wounds healed he was discharged with juxta lite compressions and these were placed in office. He was discharged on 5/5. He states that he has not taken the wraps off since discharge and has had them on for over a month. He did not want to take them off because they were very comfortable. He does not have any difficulty putting these on and off if he wanted to. He states that he does not take a shower and just cleans the other parts of his body at the sink. He noticed that 2 days ago he was having some drainage at the top of the wrap and called in to be evaluated. He states that he has not seen the wounds until the wraps were taken off today in office. He denies pain or increased warmth or erythema to the area 6/27; patient presents for 2-week follow-up. He has had increased swelling and drainage to the wounds over the last week. He reports increased erythema to the area. He does not have pain however. He denies purulent drainage or fever/chills. 7/5; patient presents for 1 week follow-up. He was prescribed Keflex at last clinic visit and is currently taking this. He reports significant improvement to his leg swelling, tenderness and erythema. He denies signs of infection. 7/21; patient presents for 2-week follow-up. He reports improvement in healing in his right lower extremity. Unfortunately this morning he noticed skin breakdown to his left lower extremity with increased redness and warmth. 7/28; patient presents for 1 week follow-up. He reports completing his antibiotics. He states that his wounds are healed. He has been using his juxta light compressions. He has no issues or complaints today. Readmission 12/17/2021; This is a 72 year old man who has been  seen twice by Dr. Mikey Bussing for wounds on his bilateral lower extremities secondary to chronic venous insufficiency. [name], [name] ([id number]) [id number] Page 6 of 10 Most recently this was from 05/22/2021 through 07/09/2021 with bilateral lower extremity wounds largely secondary to chronic venous insufficiency with stasis dermatitis. He has been discharged with juxta lite stockings. The patient tells me that about a month ago he developed too much edema to get his juxta lites on. He developed increased swelling. He saw his primary physician who gave him Lasix and the swelling seems to have come down. 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 He says this dates back into his adolescence and he could not walk barefoot etc. As a result of this he sometimes leaves his shoes and socks on for weeks and he is done that recently.[name] He arrives in clinic with not any wounds on his legs however with close inspection he had a fairly deep wound in the left second and third toe webspace His skin on his feet was  not in good shape macerated flaking malodorous The patient's past medical history has not changed. I note that he had arterial studies done on 06/03/2021 that showed an ABI on the right of 1.26 with triphasic waveforms TBI of 0.63 on the right on the left ABI of 0.85 TBI of 0.63 again with triphasic waveforms 1/19; since the patient was last here he was seen in the hospital for bradycardia. He had his medications adjusted digoxin metoprolol. He tells me they dressed the wound between the left second and third toe. The patient says that he had open wounds on his dorsal foot when we took off the dressing today. I was not exactly sure what he had on their. He had 2 wounds on the left dorsal foot with some significant surrounding erythema.The wound between the left second and third toe was just about closed however when we opened it  to examine it today it it opened and a small area. The skin on his lower legs especially the left looks a lot better using triamcinolone and Cetaphil which she is applying He was on cephalexin prior described I think by primary care before he came here he completed this about 3 or 4 days ago 1/26; the area between the left second and third toes is healed. He still has 2 small areas on the left dorsal foot. The erythema that I marked around this last week seems better. He is completing his doxycycline. I also gave him triamcinolone and Cetaphil last week for the dry flaking irritated inflamed skin on his dorsal feet and lower legs this all seems better. He has been wearing his juxta lite stockings 2/2; everything is healed here. He is now treating his skin with I think both the triamcinolone and Cetaphil mixture that I prescribed and probably pure triamcinolone that the dermatologist prescribed. I told him to use my compounded steroid cream as a moisture and anti-itch and inflammation cream when he is finished without to go to a pure moisturizer I emphasized that steroids under compression even if its a juxta lite stocking can get absorbed and secondarily can cause skin atrophy. I asked him not to use both. On an optimistic front he seems a lot more interested in treating his skin and watching for areas on his feet that are breaking down. His skin looks a lot better he has a juxta lite stocking 2/14; patient we discharged 2 weeks ago. He has noted increasing edema in the right greater than left leg erythema and intense pruritus. Arrives in clinic really with no open wound however considerable amount of marks on his upper right leg from scratching. He claims compliance with his juxta lites up with up to a week ago on the right. He has been wearing 1 on the left. He has home health nurse put kerlix on this 02/03/2022: No open wounds. He has been in 3 layer compression, which is slipped down and caused some  "mushrooming" affect from just below the tibial tuberosity to the knee. The excoriations on his legs have resolved. He reports being compliant with moisturizing and elevation of his legs. He saw his cardiologist yesterday and was prescribed 20 mg of Lasix to be used on an as-needed basis. Readmission 06/01/2022 [name] is a 72 year old male with a past medical history of venous insufficiency, lymphedema, type 2 diabetes that presents to the clinic for a 1 month history of worsening leg wounds. He states he has not been wearing his compression wraps for the past  2 months. He states his Lasix was stopped by his primary doctor and then his legs became more swollen and he could not wear his compression stockings. He states eventually he was put back on Lasix. He still has been unable to wear his compression stockings and although his swelling has improved slightly his wounds have not improved. He has increased warmth and erythema to the right lower extremity. 6/29; patient presents for follow-up. He completed his course of Keflex without issues. We have been using silver alginate with antibiotic ointment under compression therapy. He has no issues or complaints today. He has his juxta lite compressions with him today. 7/7; patient presents for follow-up. We have been using silver alginate under 3 layer compression. Patient has done well with this. His wounds have healed. He states he is going to get custom garment compression wraps. For now he has his juxta lite compressions. 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 He states that he had a shrinker on and when taken off it peeled back the skin. He has been healing however has 2 open areas still present. He has been keeping the area covered.  He also reports open wounds to the right lower extremity but these have healed with Ace wraps. He does have juxta lite compression Velcro wraps at home. He has home health that changes his dressings. He currently denies signs of infection. 6/18; patient presents for follow-up. We have been using Hydrofera Blue to the wound beds under Tubigrip. Wounds are smaller. Patient History Unable to Obtain Patient History due to Altered Mental Status. Information obtained from Patient. Family History Cancer - Mother,Maternal Grandparents, Heart Disease - Maternal Grandparents, Hypertension - Maternal Grandparents, No family history of Diabetes, Hereditary Spherocytosis. Social History Former smoker - ended on 09/14/1983, Marital Status - Single, Alcohol Use - Rarely, Drug Use - No History, Caffeine Use - Never. Medical History Eyes Patient has history of Cataracts - 2014 Hematologic/Lymphatic Patient has history of Lymphedema Cardiovascular Patient has history of Arrhythmia - A-Fib, Peripheral Venous Disease Endocrine Patient has history of Type II Diabetes Musculoskeletal Patient has history of Osteoarthritis Neurologic Patient has history of Neuropathy Denies history of Quadriplegia, Paraplegia, Seizure Disorder Oncologic Denies history of Received Chemotherapy, Received Radiation Psychiatric Denies history of Anorexia/bulimia, Confinement Anxiety HENNY, FINO Reese ([id number]) [id number] Page 7 of 10 Hospitalization/Surgery History - colon resection. - appendectomy. - multiple tooth extraction. Medical A Surgical History Notes nd Hematologic/Lymphatic Hyperbilirubinemia Gastrointestinal Rectal Pain-Constipation Genitourinary CKD stage 3-BPH Oncologic Colon Cancer-Colon Re-section Objective Constitutional respirations regular, non-labored and within target range for patient.. Vitals Time Taken: 11:17 AM, Temperature: 98.6 F, Pulse: 73 bpm, Respiratory  Rate: 18 breaths/min, Blood Pressure: 131/73 mmHg, Capillary Blood Glucose: 162 mg/dl. Psychiatric pleasant and cooperative. General Notes: Left BKA stump: 2 open wounds with granulation tissue. No signs of infection. Integumentary (Hair, Skin) Wound #14 status is Open. Original cause of wound was Blister. The date acquired was: 05/11/2023. The wound has been in treatment 1 weeks. The wound is located on the Left Amputation Site - Below Knee. The wound measures 0.8cm length x 0.5cm width x 0.1cm depth; 0.314cm^2 area and 0.031cm^3 volume. There is Fat Layer (Subcutaneous Tissue) exposed. There is no tunneling or undermining noted. There is a medium amount of serosanguineous drainage noted. The wound margin is distinct with  the outline attached to the wound base. There is large (67-100%) red, pink granulation within the wound bed. There is no necrotic tissue within the wound bed. The periwound skin appearance exhibited: Scarring, Dry/Scaly, Hemosiderin Staining. The periwound skin appearance did not exhibit: Maceration. Periwound temperature was noted as No Abnormality. The periwound has tenderness on palpation. Wound #15 status is Open. Original cause of wound was Blister. The date acquired was: 04/13/2023. The wound has been in treatment 1 weeks. The wound is located on the Left,Proximal,Medial Amputation Site - Below Knee. The wound measures 0.9cm length x 1.2cm width x 0.1cm depth; 0.848cm^2 area and 0.085cm^3 volume. There is no tunneling or undermining noted. There is a medium amount of serosanguineous drainage noted. There is large (67-100%) granulation within the wound bed. There is no necrotic tissue within the wound bed. 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 I recommended continuing the course with Hydrofera Blue and Tubigrip to be changed 3 times weekly. He has home health, well care. We will send orders. Follow-up in 1 week. Plan Follow-up Appointments: Return Appointment in 1 week. - Dr. Mikey Bussing 06/09/2023 2pm room 9 Thursday Anesthetic: (In clinic) Topical Lidocaine 4% applied to wound bed Bathing/ Shower/ Hygiene: May shower and wash wound with soap and water. Edema Control - Lymphedema / SCD / Other: Compression stocking or Garment 20-30 mm/Hg pressure to: Other Edema Control Orders/Instructions: - Order Juxtalites 20/30. Wear Tubigrip until Juxtalites come in. Home Health: No change in wound care orders this week; continue Home Health for wound care. May utilize formulary equivalent dressing for wound treatment orders unless otherwise specified. - x1 a week by home health and wound center weekly. Other Home Health Orders/Instructions: [facility] home health [name], [name] ([id number]) [id number] Page 8 of 10 WOUND #14: - Amputation Site - Below Knee Wound Laterality: Left Cleanser: Vashe 5.8 (oz) 2 x Per Week/30 Days Discharge Instructions: Cleanse the wound with Vashe prior to applying a clean dressing using gauze sponges, not tissue or cotton balls. Prim Dressing: Hydrofera Blue Ready Transfer Foam, 4x5 (in/in) 2 x Per Week/30 Days ary Discharge Instructions: Apply to wound bed as instructed three times a day Secondary Dressing: Zetuvit Plus Silicone Border Dressing 4x4 (in/in) (Home Health) 2 x Per Week/30 Days Discharge Instructions: Apply silicone border over primary dressing as directed. Com pression Wrap: Tubigrip D 2 x Per Week/30 Days Discharge Instructions: Wear until Juxtalites come in. May wash as documented above. WOUND #15: - Amputation Site -  Below Knee Wound Laterality: Left, Medial, Proximal Cleanser: Vashe 5.8 (oz) 2 x Per Week/30 Days Discharge Instructions: Cleanse the wound with Vashe prior to applying a clean dressing using gauze sponges, not tissue or cotton balls. Prim Dressing: Hydrofera Blue Ready Transfer Foam, 4x5 (in/in) 2 x Per Week/30 Days ary Discharge Instructions: Apply to wound bed as instructed three times a day Secondary Dressing: Zetuvit Plus Silicone Border Dressing 4x4 (in/in) (Home Health) 2 x Per Week/30 Days Discharge Instructions: Apply silicone border over primary dressing as directed. Com pression Wrap: Tubigrip D 2 x Per Week/30 Days Discharge Instructions: Wear until Juxtalites come in. May wash as documented above. 1. Hydrofera Blue 2. Tubigrip 3. Follow-up  in 1 week 4. Continue to wear her juxta light daily on the right lower extremity Electronic Signature(s) Signed: 05/31/2023 3:45:24 PM By: Geralyn Corwin DO Entered By: Geralyn Corwin on 05/31/2023 11:36:05 -------------------------------------------------------------------------------- HxROS Details Patient Name: Date of Service: [name], Allen [name]. 05/31/2023 11:00 A M Medical Record Number: [id number] Patient Account Number: [id number] Date of Birth/Sex: Treating RN: 08-28-1951 (72 y.o. M) Primary Care Provider: [name] NDI Other Clinician: Referring Provider: Treating Provider/Extender: Geralyn Corwin HA RRIS, BRA NDI Weeks in Treatment: 1 Unable to Obtain Patient History due to Altered Mental Status Information Obtained From Patient Eyes Medical History: Positive for: Cataracts - 2014 Hematologic/Lymphatic Medical History: Positive for: Lymphedema Past Medical History Notes: Hyperbilirubinemia Cardiovascular Medical History: Positive for: Arrhythmia - A-Fib; Peripheral Venous Disease Gastrointestinal Medical History: Past Medical History Notes: Rectal Pain-Constipation Endocrine Medical HistoryAKASH, HEBDON ([id number]) [id number] Page 9 of 10 Positive for: Type II Diabetes Time with diabetes: Since 2014 Treated with: Oral agents Blood sugar tested every day: Yes Tested : Genitourinary Medical History: Past Medical History Notes: CKD stage 3-BPH Musculoskeletal Medical History: Positive for: Osteoarthritis Neurologic Medical History: Positive for: Neuropathy Negative for: Quadriplegia; Paraplegia; Seizure Disorder Oncologic Medical History: Negative for: Received Chemotherapy; Received Radiation Past Medical History Notes: Colon Cancer-Colon Re-section Psychiatric Medical History: Negative for: Anorexia/bulimia; Confinement Anxiety HBO Extended History Items Eyes: Cataracts Immunizations Pneumococcal Vaccine: Received Pneumococcal Vaccination: No Implantable Devices None Hospitalization / Surgery History Type of Hospitalization/Surgery colon resection appendectomy multiple tooth extraction Family and Social History Cancer: Yes - Mother,Maternal Grandparents; Diabetes: No; Heart Disease: Yes - Maternal Grandparents; Hereditary Spherocytosis: No; Hypertension: Yes - Maternal Grandparents; Former smoker - ended on 09/14/1983; Marital Status - Single; Alcohol Use: Rarely; Drug Use: No History; Caffeine Use: Never; Financial Concerns: No; Food, Clothing or Shelter Needs: No; Support System Lacking: No; Transportation Concerns: No Electronic Signature(s) Signed: 05/31/2023 3:45:24 PM By: Geralyn Corwin DO Entered By: Geralyn Corwin on 05/31/2023 11:34:01 -------------------------------------------------------------------------------- SuperBill Details Patient Name: Date of Service: [name], Allen [name]. 05/31/2023 Medical Record Number: [id number] Patient Account Number: [id number] Date of Birth/Sex: Treating RN: [birth year] (72 y.o. [name] Primary Care Provider: [name] NDI Other Clinician: TAIMUR, FEICHT ([id number])  [id number] Page 10 of 10 Referring Provider: Treating Provider/Extender: Geralyn Corwin HA RRIS, BRA NDI Weeks in Treatment: 1 Diagnosis Coding ICD-10 Codes Code Description [phone] Non-pressure chronic ulcer of other part of left lower leg with fat layer exposed E11.622 Type 2 diabetes mellitus with other skin ulcer Z89.512 Acquired absence of left leg below knee I87.323 Chronic venous hypertension (idiopathic) with inflammation of bilateral lower extremity I89.0 Lymphedema, not elsewhere classified Facility Procedures : CPT4 Code: [id number] Description: 99214 - WOUND CARE VISIT-LEV 4 EST PT Modifier: Quantity: 1 Physician Procedures : CPT4 Code Description Modifier [id number] - WC PHYS LEVEL 3 - EST PT ICD-10 Diagnosis Description L97.822 Non-pressure chronic ulcer of other part of left lower leg with fat layer exposed E11.622 Type 2 diabetes mellitus with other skin ulcer  Z89.512 Acquired absence of left leg below knee I87.323 Chronic venous hypertension (idiopathic) with inflammation of bilateral lower extremity Quantity: 1 Electronic Signature(s) Signed: 05/31/2023 3:45:24 PM By: Geralyn Corwin DO Entered By: Geralyn Corwin on 05/31/2023 11:36:21

## 2023-05-31 NOTE — Progress Notes (Signed)
[name], [name] ([id number]) [id number] Page 1 of 10 Visit Report for 05/31/2023 Arrival Information Details Patient Name: Date of Service: [name], [place] [name]. 05/31/2023 11:00 A M Medical Record Number: [id number] Patient Account Number: [id number] Date of Birth/Sex: Treating RN: 1950-12-14 (72 y.o. M) Primary Care Mulki Roesler: HA Belinda Block NDI Other Clinician: Referring Georgene Kopper: Treating Brihany Butch/Extender: Geralyn Corwin HA RRIS, BRA NDI Weeks in Treatment: 1 Visit Information History Since Last Visit Added or deleted any medications: No Patient Arrived: Wheel Chair Any new allergies or adverse reactions: No Arrival Time: 11:13 Had a fall or experienced change in No Accompanied By: self activities of daily living that may affect Transfer Assistance: None risk of falls: Patient Identification Verified: Yes Signs or symptoms of abuse/neglect since last visito No Secondary Verification Process Completed: Yes Hospitalized since last visit: No Patient Requires Transmission-Based Precautions: No Implantable device outside of the clinic excluding No Patient Has Alerts: No cellular tissue based products placed in the center since last visit: Has Dressing in Place as Prescribed: Yes Has Compression in Place as Prescribed: Yes Pain Present Now: No Electronic Signature(s) Signed: 05/31/2023 4:35:45 PM By: Thayer Dallas Entered By: Thayer Dallas on 05/31/2023 11:13:42 -------------------------------------------------------------------------------- Clinic Level of Care Assessment Details Patient Name: Date of Service: [name], DA [name]. 05/31/2023 11:00 A M Medical Record Number: [id number] Patient Account Number: [id number] Date of Birth/Sex: Treating RN: 1951-02-02 (72 y.o. Harlon Flor, Millard.Loa Primary Care Sultana Tierney: [name] NDI Other Clinician: Referring Delana Manganello: Treating Adabelle Griffiths/Extender: Geralyn Corwin HA RRIS, BRA NDI Weeks in Treatment:  1 Clinic Level of Care Assessment Items TOOL 4 Quantity Score X- 1 0 Use when only an EandM is performed on FOLLOW-UP visit ASSESSMENTS - Nursing Assessment / Reassessment X- 1 10 Reassessment of Co-morbidities (includes updates in patient status) X- 1 5 Reassessment of Adherence to Treatment Plan ASSESSMENTS - Wound and Skin A ssessment / Reassessment []  - 0 Simple Wound Assessment / Reassessment - one wound X- 2 5 Complex Wound Assessment / Reassessment - multiple wounds []  - 0 Dermatologic / Skin Assessment (not related to wound area) ASSESSMENTS - Focused Assessment []  - 0 Circumferential Edema Measurements - multi extremities []  - 0 Nutritional Assessment / Counseling / Intervention [name], [name] ([id number]) [phone].pdf Page 2 of 10 []  - 0 Lower Extremity Assessment (monofilament, tuning fork, pulses) []  - 0 Peripheral Arterial Disease Assessment (using hand held doppler) ASSESSMENTS - Ostomy and/or Continence Assessment and Care []  - 0 Incontinence Assessment and Management []  - 0 Ostomy Care Assessment and Management (repouching, etc.) PROCESS - Coordination of Care []  - 0 Simple Patient / Family Education for ongoing care X- 1 20 Complex (extensive) Patient / Family Education for ongoing care X- 1 10 Staff obtains Chiropractor, Records, T Results / Process Orders est X- 1 10 Staff telephones HHA, Nursing Homes / Clarify orders / etc []  - 0 Routine Transfer to another Facility (non-emergent condition) []  - 0 Routine Hospital Admission (non-emergent condition) []  - 0 New Admissions / Manufacturing engineer / Ordering NPWT Apligraf, etc. , []  - 0 Emergency Hospital Admission (emergent condition) []  - 0 Simple Discharge Coordination X- 1 15 Complex (extensive) Discharge Coordination PROCESS - Special Needs []  - 0 Pediatric / Minor Patient Management []  - 0 Isolation Patient Management []  - 0 Hearing / Language / Visual  special needs []  - 0 Assessment of Community assistance (transportation, D/C planning, etc.) []  - 0 Additional assistance / Altered mentation []  - 0 Support Surface(s)  Assessment (bed, cushion, seat, etc.) INTERVENTIONS - Wound Cleansing / Measurement []  - 0 Simple Wound Cleansing - one wound X- 2 5 Complex Wound Cleansing - multiple wounds X- 1 5 Wound Imaging (photographs - any number of wounds) []  - 0 Wound Tracing (instead of photographs) []  - 0 Simple Wound Measurement - one wound X- 2 5 Complex Wound Measurement - multiple wounds INTERVENTIONS - Wound Dressings X - Small Wound Dressing one or multiple wounds 2 10 []  - 0 Medium Wound Dressing one or multiple wounds []  - 0 Large Wound Dressing one or multiple wounds []  - 0 Application of Medications - topical []  - 0 Application of Medications - injection INTERVENTIONS - Miscellaneous []  - 0 External ear exam []  - 0 Specimen Collection (cultures, biopsies, blood, body fluids, etc.) []  - 0 Specimen(s) / Culture(s) sent or taken to Lab for analysis []  - 0 Patient Transfer (multiple staff / Nurse, adult / Similar devices) []  - 0 Simple Staple / Suture removal (25 or less) []  - 0 Complex Staple / Suture removal (26 or more) []  - 0 Hypo / Hyperglycemic Management (close monitor of Blood Glucose) [name], [name] ([id number]) [id number] Page 3 of 10 []  - 0 Ankle / Brachial Index (ABI) - do not check if billed separately X- 1 5 Vital Signs Has the patient been seen at the hospital within the last three years: Yes Total Score: 130 Level Of Care: New/Established - Level 4 Electronic Signature(s) Signed: 05/31/2023 3:42:23 PM By: Shawn Stall RN, BSN Entered By: Shawn Stall on 05/31/2023 11:32:01 -------------------------------------------------------------------------------- Encounter Discharge Information Details Patient Name: Date of Service: [name], DA [name]. 05/31/2023 11:00 A  M Medical Record Number: [id number] Patient Account Number: [id number] Date of Birth/Sex: Treating RN: 09-Apr-1951 (73 y.o. [name] Primary Care Adonias Demore: [name] NDI Other Clinician: Referring Nikki Rusnak: Treating Finn Altemose/Extender: Geralyn Corwin HA RRIS, BRA NDI Weeks in Treatment: 1 Encounter Discharge Information Items Discharge Condition: Stable Ambulatory Status: Wheelchair Discharge Destination: Home Transportation: Private Auto Accompanied By: self Schedule Follow-up Appointment: Yes Clinical Summary of Care: Electronic Signature(s) Signed: 05/31/2023 3:42:23 PM By: Shawn Stall RN, BSN Entered By: Shawn Stall on 05/31/2023 11:32:26 -------------------------------------------------------------------------------- Lower Extremity Assessment Details Patient Name: Date of Service: [name], DA [name]. 05/31/2023 11:00 A M Medical Record Number: [id number] Patient Account Number: [id number] Date of Birth/Sex: Treating RN: [birth year] (72 y.o. M) Primary Care Jolee Critcher: [name] NDI Other Clinician: Referring Yomira Flitton: Treating Gurdeep Keesey/Extender: Cannon Kettle, BRA NDI Weeks in Treatment: 1 Electronic Signature(s) Signed: 05/31/2023 4:35:45 PM By: Thayer Dallas Entered By: Thayer Dallas on 05/31/2023 11:14:00 -------------------------------------------------------------------------------- Multi Wound Chart Details Patient Name: Date of Service: [name], DA [name]. 05/31/2023 11:00 A MYSHON, ZWAHLEN ([id number]) [phone].pdf Page 4 of 10 Medical Record Number: [id number] Patient Account Number: [id number] Date of Birth/Sex: Treating RN: October 20, 1951 (72 y.o. M) Primary Care Jayvion Stefanski: HA Belinda Block NDI Other Clinician: Referring Gregorio Worley: Treating Zakhari Fogel/Extender: Geralyn Corwin HA RRIS, BRA NDI Weeks in Treatment: 1 Vital Signs Height(in): Capillary Blood Glucose(mg/dl): 220 Weight(lbs): Pulse(bpm): 73 Body Mass  Index(BMI): Blood Pressure(mmHg): 131/73 Temperature(F): 98.6 Respiratory Rate(breaths/min): 18 [14:Photos:] [N/A:N/A] Left Amputation Site - Below Knee Left, Proximal, Medial Amputation Site N/A Wound Location: - Below Knee Blister Blister N/A Wounding Event: Diabetic Wound/Ulcer of the Lower Diabetic Wound/Ulcer of the Lower N/A Primary Etiology: Extremity Extremity Cataracts, Lymphedema, Arrhythmia, Cataracts, Lymphedema, Arrhythmia, N/A Comorbid History: Peripheral Venous Disease, Type II Peripheral Venous Disease, Type  II Diabetes, Osteoarthritis, Neuropathy Diabetes, Osteoarthritis, Neuropathy 05/11/2023 04/13/2023 N/A Date Acquired: 1 1 N/A Weeks of Treatment: Open Open N/A Wound Status: No No N/A Wound Recurrence: 0.8x0.5x0.1 0.9x1.2x0.1 N/A Measurements Reese x W x D (cm) 0.314 0.848 N/A A (cm) : rea 0.031 0.085 N/A Volume (cm) : 92.00% 73.00% N/A % Reduction in A rea: 92.10% 72.90% N/A % Reduction in Volume: Grade 1 Grade 1 N/A Classification: Medium Medium N/A Exudate A mount: Serosanguineous Serosanguineous N/A Exudate Type: red, brown red, brown N/A Exudate Color: Distinct, outline attached N/A N/A Wound Margin: Large (67-100%) Large (67-100%) N/A Granulation A mount: Red, Pink N/A N/A Granulation Quality: None Present (0%) None Present (0%) N/A Necrotic A mount: Fat Layer (Subcutaneous Tissue): Yes N/A N/A Exposed Structures: Small (1-33%) Small (1-33%) N/A Epithelialization: Scarring: Yes N/A Periwound Skin Texture: Dry/Scaly: Yes N/A Periwound Skin Moisture: Maceration: No Hemosiderin Staining: Yes N/A Periwound Skin Color: No Abnormality N/A N/A Temperature: Yes N/A N/A Tenderness on Palpation: Treatment Notes Wound #14 (Amputation Site - Below Knee) Wound Laterality: Left Cleanser Vashe 5.8 (oz) Discharge Instruction: Cleanse the wound with Vashe prior to applying a clean dressing using gauze sponges, not tissue or cotton  balls. Peri-Wound Care Topical Primary Dressing Hydrofera Blue Ready Transfer Foam, 4x5 (in/in) Discharge Instruction: Apply to wound bed as instructed three times a day Secondary Dressing Zetuvit Plus Silicone Border Dressing 4x4 (in/in) Discharge Instruction: Apply silicone border over primary dressing as directed. [name], [name] ([id number]) [id number] Page 5 of 10 Secured With Compression Wrap Tubigrip D Discharge Instruction: Wear until Juxtalites come in. May wash as documented above. Compression Stockings Add-Ons Wound #15 (Amputation Site - Below Knee) Wound Laterality: Left, Medial, Proximal Cleanser Vashe 5.8 (oz) Discharge Instruction: Cleanse the wound with Vashe prior to applying a clean dressing using gauze sponges, not tissue or cotton balls. Peri-Wound Care Topical Primary Dressing Hydrofera Blue Ready Transfer Foam, 4x5 (in/in) Discharge Instruction: Apply to wound bed as instructed three times a day Secondary Dressing Zetuvit Plus Silicone Border Dressing 4x4 (in/in) Discharge Instruction: Apply silicone border over primary dressing as directed. Secured With Compression Wrap Tubigrip D Discharge Instruction: Wear until Juxtalites come in. May wash as documented above. Compression Stockings Add-Ons Electronic Signature(s) Signed: 05/31/2023 3:45:24 PM By: Geralyn Corwin DO Entered By: Geralyn Corwin on 05/31/2023 11:33:01 -------------------------------------------------------------------------------- Multi-Disciplinary Care Plan Details Patient Name: Date of Service: [name], DA [name]. 05/31/2023 11:00 A M Medical Record Number: [id number] Patient Account Number: [id number] Date of Birth/Sex: Treating RN: 09/04/1951 (72 y.o. [name] Primary Care Adahlia Stembridge: [name] NDI Other Clinician: Referring Marly Schuld: Treating Kaevon Cotta/Extender: Geralyn Corwin HA RRIS, BRA NDI Weeks in Treatment: 1 Active  Inactive Wound/Skin Impairment Nursing Diagnoses: Knowledge deficit related to smoking impact on wound healing Goals: Patient/caregiver will verbalize understanding of skin care regimen Date Initiated: 05/24/2023 Target Resolution Date: 07/13/2023 Goal Status: Active Interventions: Assess patient/caregiver ability to obtain necessary supplies [name], [name] ([id number]) [phone].pdf Page 6 of 10 Assess patient/caregiver ability to perform ulcer/skin care regimen upon admission and as needed Assess ulceration(s) every visit Provide education on ulcer and skin care Screen for HBO Treatment Activities: Skin care regimen initiated : 05/24/2023 Topical wound management initiated : 05/24/2023 Notes: Electronic Signature(s) Signed: 05/31/2023 3:42:23 PM By: Shawn Stall RN, BSN Entered By: Shawn Stall on 05/31/2023 11:19:29 -------------------------------------------------------------------------------- Pain Assessment Details Patient Name: Date of Service: [name], DA [name]. 05/31/2023 11:00 A M Medical Record Number: [id number] Patient Account Number: [id number] Date of  Birth/Sex: Treating RN: 11-Mar-1951 (72 y.o. M) Primary Care Jailin Manocchio: [name] NDI Other Clinician: Referring Ruba Outen: Treating Zarin Knupp/Extender: Geralyn Corwin HA RRIS, BRA NDI Weeks in Treatment: 1 Active Problems Location of Pain Severity and Description of Pain Patient Has Paino No Site Locations Pain Management and Medication Current Pain Management: Electronic Signature(s) Signed: 05/31/2023 4:35:45 PM By: Thayer Dallas Entered By: Thayer Dallas on 05/31/2023 11:17:54 -------------------------------------------------------------------------------- Patient/Caregiver Education Details Patient Name: Date of Service: [name], DA [name]. 6/18/2024andnbsp11:00 A [name], [name] ([id number]) [id number] Page 7 of 10 Medical Record Number:  [id number] Patient Account Number: [id number] Date of Birth/Gender: Treating RN: April 06, 1951 (72 y.o. [name] Primary Care Physician: [name] NDI Other Clinician: Referring Physician: Treating Physician/Extender: Cannon Kettle, BRA NDI Weeks in Treatment: 1 Education Assessment Education Provided To: Patient Education Topics Provided Wound/Skin Impairment: Handouts: Caring for Your Ulcer Methods: Explain/Verbal Responses: Reinforcements needed Electronic Signature(s) Signed: 05/31/2023 3:42:23 PM By: Shawn Stall RN, BSN Entered By: Shawn Stall on 05/31/2023 11:19:51 -------------------------------------------------------------------------------- Wound Assessment Details Patient Name: Date of Service: [name], DA [name]. 05/31/2023 11:00 A M Medical Record Number: [id number] Patient Account Number: [id number] Date of Birth/Sex: Treating RN: [birth year] (72 y.o. M) Primary Care Donyae Kilner: [name] NDI Other Clinician: Referring Faheem Ziemann: Treating Tycen Dockter/Extender: Geralyn Corwin HA RRIS, BRA NDI Weeks in Treatment: 1 Wound Status Wound Number: 14 Primary Diabetic Wound/Ulcer of the Lower Extremity Etiology: Wound Location: Left Amputation Site - Below Knee Wound Open Wounding Event: Blister Status: Date Acquired: 05/11/2023 Comorbid Cataracts, Lymphedema, Arrhythmia, Peripheral Venous Disease, Weeks Of Treatment: 1 History: Type II Diabetes, Osteoarthritis, Neuropathy Clustered Wound: No Photos Wound Measurements Length: (cm) 0.8 Width: (cm) 0.5 Depth: (cm) 0.1 Area: (cm) 0.314 Volume: (cm) 0.031 % Reduction in Area: 92% % Reduction in Volume: 92.1% Epithelialization: Small (1-33%) Tunneling: No Undermining: No Wound Description Classification: Grade 1 Wound Margin: Distinct, outline attached Exudate Amount: Medium [name], [name] ([id number]) Exudate Type: Serosanguineous Exudate Color: red, brown Foul Odor After Cleansing:  No Slough/Fibrino Yes [phone].pdf Page 8 of 10 Wound Bed Granulation Amount: Large (67-100%) Exposed Structure Granulation Quality: Red, Pink Fat Layer (Subcutaneous Tissue) Exposed: Yes Necrotic Amount: None Present (0%) Periwound Skin Texture Texture Color No Abnormalities Noted: No No Abnormalities Noted: No Scarring: Yes Hemosiderin Staining: Yes Moisture Temperature / Pain No Abnormalities Noted: No Temperature: No Abnormality Dry / Scaly: Yes Tenderness on Palpation: Yes Maceration: No Treatment Notes Wound #14 (Amputation Site - Below Knee) Wound Laterality: Left Cleanser Vashe 5.8 (oz) Discharge Instruction: Cleanse the wound with Vashe prior to applying a clean dressing using gauze sponges, not tissue or cotton balls. Peri-Wound Care Topical Primary Dressing Hydrofera Blue Ready Transfer Foam, 4x5 (in/in) Discharge Instruction: Apply to wound bed as instructed three times a day Secondary Dressing Zetuvit Plus Silicone Border Dressing 4x4 (in/in) Discharge Instruction: Apply silicone border over primary dressing as directed. Secured With Compression Wrap Tubigrip D Discharge Instruction: Wear until Juxtalites come in. May wash as documented above. Compression Stockings Add-Ons Electronic Signature(s) Signed: 05/31/2023 4:35:45 PM By: Thayer Dallas Entered By: Thayer Dallas on 05/31/2023 11:18:24 -------------------------------------------------------------------------------- Wound Assessment Details Patient Name: Date of Service: [name], DA [name]. 05/31/2023 11:00 A M Medical Record Number: [id number] Patient Account Number: [id number] Date of Birth/Sex: Treating RN: [birth year] (72 y.o. M) Primary Care Maryhelen Lindler: [name] NDI Other Clinician: Referring Dianca Owensby: Treating Deleah Tison/Extender: Geralyn Corwin HA RRIS, BRA NDI Weeks in Treatment: 1 Wound Status Wound  Number: 15 Primary Diabetic Wound/Ulcer of the Lower  Extremity Etiology: Wound Location: Left, Proximal, Medial Amputation Site - Below Knee Wound Open Wounding Event: Blister Status: Date Acquired: 04/13/2023 Comorbid Cataracts, Lymphedema, Arrhythmia, Peripheral Venous Disease, Weeks Of Treatment: 1 History: Type II Diabetes, Osteoarthritis, Neuropathy Clustered Wound: No [name], [name] ([id number]) [id number].pdf Page 9 of 10 Photos Wound Measurements Length: (cm) 0.9 Width: (cm) 1.2 Depth: (cm) 0.1 Area: (cm) 0.848 Volume: (cm) 0.085 % Reduction in Area: 73% % Reduction in Volume: 72.9% Epithelialization: Small (1-33%) Tunneling: No Undermining: No Wound Description Classification: Grade 1 Exudate Amount: Medium Exudate Type: Serosanguineous Exudate Color: red, brown Foul Odor After Cleansing: No Slough/Fibrino No Wound Bed Granulation Amount: Large (67-100%) Necrotic Amount: None Present (0%) Periwound Skin Texture Texture Color No Abnormalities Noted: No No Abnormalities Noted: No Moisture No Abnormalities Noted: No Treatment Notes Wound #15 (Amputation Site - Below Knee) Wound Laterality: Left, Medial, Proximal Cleanser Vashe 5.8 (oz) Discharge Instruction: Cleanse the wound with Vashe prior to applying a clean dressing using gauze sponges, not tissue or cotton balls. Peri-Wound Care Topical Primary Dressing Hydrofera Blue Ready Transfer Foam, 4x5 (in/in) Discharge Instruction: Apply to wound bed as instructed three times a day Secondary Dressing Zetuvit Plus Silicone Border Dressing 4x4 (in/in) Discharge Instruction: Apply silicone border over primary dressing as directed. Secured With Compression Wrap Tubigrip D Discharge Instruction: Wear until Juxtalites come in. May wash as documented above. Compression Stockings Add-Ons Electronic Signature(s) Signed: 05/31/2023 4:35:45 PM By: Thayer Dallas Entered By: Thayer Dallas on 05/31/2023 11:18:55 [name] ([id number])  [id number] Page 10 of 10 -------------------------------------------------------------------------------- Vitals Details Patient Name: Date of Service: [name], DA [name]. 05/31/2023 11:00 A M Medical Record Number: [id number] Patient Account Number: [id number] Date of Birth/Sex: Treating RN: 1951/01/13 (72 y.o. M) Primary Care Nickalus Thornsberry: [name] NDI Other Clinician: Referring Kyson Kupper: Treating Romario Tith/Extender: Geralyn Corwin HA RRIS, BRA NDI Weeks in Treatment: 1 Vital Signs Time Taken: 11:17 Temperature (F): 98.6 Pulse (bpm): 73 Respiratory Rate (breaths/min): 18 Blood Pressure (mmHg): 131/73 Capillary Blood Glucose (mg/dl): 606 Reference Range: 80 - 120 mg / dl Electronic Signature(s) Signed: 05/31/2023 4:35:45 PM By: Thayer Dallas Entered By: Thayer Dallas on 05/31/2023 11:17:44

## 2023-06-09 ENCOUNTER — Ambulatory Visit (HOSPITAL_BASED_OUTPATIENT_CLINIC_OR_DEPARTMENT_OTHER): Payer: 59 | Admitting: Internal Medicine

## 2023-06-09 NOTE — Progress Notes (Signed)
Cardiology Office Note:  .   Date:  06/10/2023  ID:  [name], DOB 1951-06-06, MRN [id number] PCP: Cristino Martes, NP  [facility] Providers Cardiologist:  Chrystie Nose, MD    History of Present Illness: .   [name] is a 72 y.o. male with past medical history of atrial fibrillation, BPH, anxiety, constipation, DM2, hypertension, hyperlipidemia, macrocytic anemia, S/P L BKA, and bifascicular block.  In 2014 he established with Dr. Rennis Golden after a move to the area from [place], with known diagnosis of atrial fibrillation.  He had been rate controlled, intermittently on anticoagulation due to financial limitation.  12/22/2021 he was admitted with abdominal pain, and dizziness.  Abdominal imaging consistent with cholelithiasis without cholecystitis.  At that time was found to be bradycardic in the 30s with a bifascicular block.  His digoxin level was 1.1 and was discontinued, Toprol was also discontinued.  Echocardiogram at that time indicated an EF of 60 to 65%, no RWMA, RV normal size and function, LA mildly dilated, no significant valvular abnormalities, aortic root 47 mm.  On 08/2022 he was admitted for cholecystitis.  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  He was last seen in office on 12/27/2022 for follow-up independently.  He had been staying at short-term rehab and was hopeful to return to his usual ALF.  Doing well from a cardiac perspective, his atrial fibrillation was rate controlled and he was tolerating Eliquis well. Echocardiogram on 02/03/23 indicated an EF of 55 to 60% with normal LV function, aortic root measuring 45mm, stable compared to prior echo.   Reports he is doing well today. He has established with a new PCP and recently had an hours long visit. She increased his furosemide to 80mg  daily to try and improve his lower extremity edema. Feels his lower extremity swelling has improved since starting lasix  80mg . He reports following his return home from rehab his legs immediately started to swell, notes he does not have a lot of activity. He had a RLE dupplex on 05/11/23 that showed no evidence of femoropopliteal DVT or superficial thrombophlebitis within the right lower extremity. He has been having wound care to his right leg, is wearing compression wraps for lymphedema. He has been fitted and a prosthesis was made but with his swelling is unable to wear. Notes he had previously been wearing a shrinker sock but when it was removed it removed parts of his skin, now healing. He denies pain in his lower extremities. He denies shortness of breath, chest pain, orthopnea or PND.   ROS: He denies chest pain, palpitations, dyspnea, pnd, orthopnea, n, v, dizziness, syncope, edema, weight gain, or early satiety. All other systems reviewed and are otherwise negative except as noted above.    Studies Reviewed: [name]   EKG Interpretation Date/Time:  Friday June 10 2023 14:12:20 EDT Ventricular Rate:  64 PR Interval:    QRS Duration:  156 QT Interval:  468 QTC Calculation: 482 R Axis:   257  Text Interpretation: Atrial fibrillation , rate controlled Right bundle branch block Confirmed by Reather Littler [phone]) on 06/10/2023 2:21:09 PM   Cardiac Studies & Procedures       ECHOCARDIOGRAM  ECHOCARDIOGRAM COMPLETE 02/03/2023  Narrative ECHOCARDIOGRAM REPORT    Patient Name:   [name] Date of Exam: 02/03/2023 Medical Rec #:  [id number]       Height:       75.0 in Accession #:  [id number]      Weight:       254.0 lb Date of Birth:  10-07-1951       BSA:          2.429 m Patient Age:    72 years        BP:           127/72 mmHg Patient Gender: M               HR:           69 bpm. Exam Location:  Outpatient  Procedure: 2D Echo, 3D Echo, Color Doppler, Cardiac Doppler and Strain Analysis  Indications:    Ascending aortic aneurysm  History:        Patient has prior history of Echocardiogram  examinations, most recent 09/10/2022. Arrythmias:Atrial Fibrillation and RBBB; Risk Factors:Diabetes, Hypertension, Former Smoker and Dyslipidemia. Covid 19.  Sonographer:    Jeryl Columbia RDCS Referring Phys: [id number] CAITLIN S WALKER  IMPRESSIONS   1. Left ventricular ejection fraction, by estimation, is 55 to 60%. The left ventricle has normal function. Left ventricular endocardial border not optimally defined to evaluate regional wall motion. Left ventricular diastolic function could not be evaluated. 2. Right ventricular systolic function is normal. The right ventricular size is normal. Tricuspid regurgitation signal is inadequate for assessing PA pressure. 3. Left atrial size was mildly dilated. 4. The mitral valve is grossly normal. Trivial mitral valve regurgitation. No evidence of mitral stenosis. 5. The aortic valve is tricuspid. Aortic valve regurgitation is not visualized. No aortic stenosis is present. 6. Aortic dilatation noted. Aneurysm of the aortic root, measuring 45 mm. 7. The inferior vena cava is normal in size with greater than 50% respiratory variability, suggesting right atrial pressure of 3 mmHg.  Comparison(s): No significant change from prior study.  FINDINGS Left Ventricle: Left ventricular ejection fraction, by estimation, is 55 to 60%. The left ventricle has normal function. Left ventricular endocardial border not optimally defined to evaluate regional wall motion. Global longitudinal strain performed but not reported based on interpreter judgement due to suboptimal tracking. 3D left ventricular ejection fraction analysis performed but not reported based on interpreter judgement due to suboptimal tracking. The left ventricular internal cavity size was normal in size. There is no left ventricular hypertrophy. Left ventricular diastolic function could not be evaluated due to atrial fibrillation. Left ventricular diastolic function could not be evaluated.  Right  Ventricle: The right ventricular size is normal. No increase in right ventricular wall thickness. Right ventricular systolic function is normal. Tricuspid regurgitation signal is inadequate for assessing PA pressure.  Left Atrium: Left atrial size was mildly dilated.  Right Atrium: Right atrial size was normal in size.  Pericardium: There is no evidence of pericardial effusion.  Mitral Valve: The mitral valve is grossly normal. Trivial mitral valve regurgitation. No evidence of mitral valve stenosis.  Tricuspid Valve: The tricuspid valve is grossly normal. Tricuspid valve regurgitation is trivial. No evidence of tricuspid stenosis.  Aortic Valve: The aortic valve is tricuspid. Aortic valve regurgitation is not visualized. No aortic stenosis is present.  Pulmonic Valve: The pulmonic valve was grossly normal. Pulmonic valve regurgitation is not visualized. No evidence of pulmonic stenosis.  Aorta: Aortic dilatation noted. There is an aneurysm involving the aortic root measuring 45 mm.  Venous: The inferior vena cava is normal in size with greater than 50% respiratory variability, suggesting right atrial pressure of 3 mmHg.  IAS/Shunts: The atrial septum is grossly normal.   LEFT  VENTRICLE PLAX 2D LVIDd:         4.31 cm   Diastology LVIDs:         3.25 cm   LV e' medial:    8.27 cm/s LV PW:         1.17 cm   LV E/e' medial:  12.7 LV IVS:        0.85 cm   LV e' lateral:   9.03 cm/s LVOT diam:     2.30 cm   LV E/e' lateral: 11.6 LVOT Area:     4.15 cm  3D Volume EF: 3D EF:        56 % LV EDV:       181 ml LV ESV:       79 ml LV SV:        102 ml  LEFT ATRIUM             Index        RIGHT ATRIUM           Index LA diam:        5.00 cm 2.06 cm/m   RA Area:     23.40 cm LA Vol (A2C):   87.9 ml 36.19 ml/m  RA Volume:   72.10 ml  29.68 ml/m LA Vol (A4C):   76.1 ml 31.33 ml/m LA Biplane Vol: 86.4 ml 35.57 ml/m  AORTA Ao Root diam: 4.50 cm Ao Asc diam:  3.70 cm  MITRAL  VALVE MV Area (PHT): 3.12 cm     SHUNTS MV Decel Time: 243 msec     Systemic Diam: 2.30 cm MV E velocity: 105.00 cm/s MV A velocity: 29.10 cm/s MV E/A ratio:  3.61  Lennie Odor MD Electronically signed by Lennie Odor MD Signature Date/Time: 02/03/2023/7:55:15 PM    Final             Risk Assessment/Calculations:   CHA2DS2-VASc Score = 5   This indicates a 7.2% annual risk of stroke. The patient's score is based upon: CHF History: 1 HTN History: 1 Diabetes History: 1 Stroke History: 0 Vascular Disease History: 1 Age Score: 1 Gender Score: 0            Physical Exam:   VS:  BP 130/60   Pulse 64   Ht 6\' 3"  (1.905 m)   BMI 31.75 kg/m    Wt Readings from Last 3 Encounters:  12/27/22 254 lb (115.2 kg)  11/12/22 291 lb 14.2 oz (132.4 kg)  09/10/22 (!) 306 lb (138.8 kg)    GEN: Well nourished, well developed in no acute distress NECK: No JVD; No carotid bruits CARDIAC: RRR, no murmurs, rubs, gallops RESPIRATORY:  Clear to auscultation without rales, wheezing or rhonchi  ABDOMEN: Soft, non-tender, non-distended EXTREMITIES:  Slight pitting edema to right lower extremity; L BKA large in size but no apparent edema, no color changes ASSESSMENT AND PLAN: .    Atrial fibrillation/bradycardia/bifascicular block (RBBB/LAFB): Metoprolol and digoxin discontinued in January 2023 due to bifascicular block-avoid AV nodal blocking agents. EKG indicates atrial fibrillation, rate controlled, denies feelings of palpitations or racing heart. He recently had labs done at his PCP, [facility]. Health, prior to initiation of lasix 80mg , have requested his labs from 5/10. Check Bmet.  Diastolic HF/Lower extremity swelling: He has history of lymphedema and lower extremity swelling. Reports he had significant edema following his return home from rehab. Swelling prevents him from wearing his new prosthesis. Likely an element of diastolic HF given afib,  echo on 01/2023 unable to assess diastolic  function. On exam today slight pitting edema noted to right lower extremity; L BKA large in size but no apparent edema, no color changes. His PCP started him on Lasix 80mg  daily, notes improvement since starting. Encouraged low salt diet. Check right extremity ABI. Check BNP and Bmet today.   Aortic root dilation: 47mm by echo 12/2021. Repeat echo 08/2022 aortic root 46mm and ascending aorta 39mm. Echo on 02/04/23 indicated EF of 55 to 60%, Aortic root measuring 45mm, stable. Continue BP control and atorvastatin. Requested recent labs from his PCP to review lipid profile. CT has been ordered for January for continued monitoring.    Chronic anticoagulation:Denies problems with bleeding today. CHA2DS2-VASc Score = 5 [CHF History: 1, HTN History: 1, Diabetes History: 1, Stroke History: 0, Vascular Disease History: 1, Age Score: 1, Gender Score: 0].  Therefore, the patient's annual risk of stroke is 7.2 %.    Requested labs from PCP to assess kidney function. Continue Eliquis 5mg  BID.   HTN: Blood pressure well controlled today at 130/60, reports this is consistent with home blood pressures.   HLD: He had labs completed by new PCP in May. Requested labs from office. Continue atorvastatin.  DM2: Followed by PCP    Dispo: Follow up with Dr. Rennis Golden or APP in 2-3 months.   Signed, Rip Harbour, NP

## 2023-06-10 ENCOUNTER — Ambulatory Visit (INDEPENDENT_AMBULATORY_CARE_PROVIDER_SITE_OTHER): Payer: 59 | Admitting: Cardiology

## 2023-06-10 ENCOUNTER — Encounter (HOSPITAL_BASED_OUTPATIENT_CLINIC_OR_DEPARTMENT_OTHER): Payer: Self-pay | Admitting: Cardiology

## 2023-06-10 VITALS — BP 130/60 | HR 64 | Ht 75.0 in

## 2023-06-10 DIAGNOSIS — I4510 Unspecified right bundle-branch block: Secondary | ICD-10-CM

## 2023-06-10 DIAGNOSIS — I4821 Permanent atrial fibrillation: Secondary | ICD-10-CM | POA: Diagnosis not present

## 2023-06-10 DIAGNOSIS — Z7984 Long term (current) use of oral hypoglycemic drugs: Secondary | ICD-10-CM

## 2023-06-10 DIAGNOSIS — E119 Type 2 diabetes mellitus without complications: Secondary | ICD-10-CM

## 2023-06-10 DIAGNOSIS — I77810 Thoracic aortic ectasia: Secondary | ICD-10-CM | POA: Diagnosis not present

## 2023-06-10 DIAGNOSIS — I890 Lymphedema, not elsewhere classified: Secondary | ICD-10-CM

## 2023-06-10 DIAGNOSIS — I10 Essential (primary) hypertension: Secondary | ICD-10-CM | POA: Diagnosis not present

## 2023-06-10 DIAGNOSIS — D6859 Other primary thrombophilia: Secondary | ICD-10-CM

## 2023-06-10 DIAGNOSIS — R600 Localized edema: Secondary | ICD-10-CM

## 2023-06-10 DIAGNOSIS — E782 Mixed hyperlipidemia: Secondary | ICD-10-CM

## 2023-06-10 NOTE — Patient Instructions (Addendum)
Medication Instructions:  Your physician recommends that you continue on your current medications as directed. Please refer to the Current Medication list given to you today.  *If you need a refill on your cardiac medications before your next appointment, please call your pharmacy*   Lab Work: Your physician recommends that you return for lab work today- BMP & BNP   Testing:  Your physician has requested that you have an ankle brachial index (ABI). During this test an ultrasound and blood pressure cuff are used to evaluate the arteries that supply the arms and legs with blood. Allow thirty minutes for this exam. There are no restrictions or special instructions.  Follow-Up: At [facility], you and your health needs are our priority.  As part of our continuing mission to provide you with exceptional heart care, we have created designated Provider Care Teams.  These Care Teams include your primary Cardiologist (physician) and Advanced Practice Providers (APPs -  Physician Assistants and Nurse Practitioners) who all work together to provide you with the care you need, when you need it.  We recommend signing up for the patient portal called "MyChart".  Sign up information is provided on this After Visit Summary.  MyChart is used to connect with patients for Virtual Visits (Telemedicine).  Patients are able to view lab/test results, encounter notes, upcoming appointments, etc.  Non-urgent messages can be sent to your provider as well.   To learn more about what you can do with MyChart, go to [email or web address].    Your next appointment:   2-3  months with Dr. Rennis Golden or APP

## 2023-06-11 LAB — BASIC METABOLIC PANEL
BUN/Creatinine Ratio: 20 (ref 10–24)
BUN: 22 mg/dL (ref 8–27)
CO2: 21 mmol/L (ref 20–29)
Calcium: 8.9 mg/dL (ref 8.6–10.2)
Chloride: 97 mmol/L (ref 96–106)
Creatinine, Ser: 1.1 mg/dL (ref 0.76–1.27)
Glucose: 130 mg/dL — ABNORMAL HIGH (ref 70–99)
Potassium: 4 mmol/L (ref 3.5–5.2)
Sodium: 137 mmol/L (ref 134–144)
eGFR: 71 mL/min/{1.73_m2} (ref >59)

## 2023-06-11 LAB — BRAIN NATRIURETIC PEPTIDE: BNP: 77.5 pg/mL (ref 0.0–100.0)

## 2023-06-13 ENCOUNTER — Telehealth (HOSPITAL_BASED_OUTPATIENT_CLINIC_OR_DEPARTMENT_OTHER): Payer: Self-pay

## 2023-06-13 NOTE — Telephone Encounter (Addendum)
Left message for patient to call back   ----- Message from Rip Harbour, NP sent at 06/13/2023  7:42 AM EDT ----- BNP shows no fluid overload. Kidney function and electrolytes are both normal. Good result! Continue current medication regimen and we will see what ABI indicates.

## 2023-06-13 NOTE — Telephone Encounter (Signed)
Patient returned call, transferred from call center,   Results called to patient who verbalizes understanding!

## 2023-06-14 ENCOUNTER — Encounter (HOSPITAL_BASED_OUTPATIENT_CLINIC_OR_DEPARTMENT_OTHER): Payer: 59 | Attending: Internal Medicine | Admitting: General Surgery

## 2023-06-14 DIAGNOSIS — E11622 Type 2 diabetes mellitus with other skin ulcer: Secondary | ICD-10-CM | POA: Diagnosis present

## 2023-06-14 DIAGNOSIS — I87311 Chronic venous hypertension (idiopathic) with ulcer of right lower extremity: Secondary | ICD-10-CM | POA: Insufficient documentation

## 2023-06-14 DIAGNOSIS — L97822 Non-pressure chronic ulcer of other part of left lower leg with fat layer exposed: Secondary | ICD-10-CM | POA: Diagnosis not present

## 2023-06-14 DIAGNOSIS — X58XXXA Exposure to other specified factors, initial encounter: Secondary | ICD-10-CM | POA: Diagnosis not present

## 2023-06-14 DIAGNOSIS — S81801A Unspecified open wound, right lower leg, initial encounter: Secondary | ICD-10-CM | POA: Diagnosis not present

## 2023-06-14 DIAGNOSIS — Z8249 Family history of ischemic heart disease and other diseases of the circulatory system: Secondary | ICD-10-CM | POA: Diagnosis not present

## 2023-06-14 DIAGNOSIS — Z89512 Acquired absence of left leg below knee: Secondary | ICD-10-CM | POA: Diagnosis not present

## 2023-06-14 DIAGNOSIS — N183 Chronic kidney disease, stage 3 unspecified: Secondary | ICD-10-CM | POA: Insufficient documentation

## 2023-06-14 DIAGNOSIS — E1122 Type 2 diabetes mellitus with diabetic chronic kidney disease: Secondary | ICD-10-CM | POA: Insufficient documentation

## 2023-06-14 DIAGNOSIS — Z87891 Personal history of nicotine dependence: Secondary | ICD-10-CM | POA: Insufficient documentation

## 2023-06-14 DIAGNOSIS — I129 Hypertensive chronic kidney disease with stage 1 through stage 4 chronic kidney disease, or unspecified chronic kidney disease: Secondary | ICD-10-CM | POA: Insufficient documentation

## 2023-06-14 DIAGNOSIS — I890 Lymphedema, not elsewhere classified: Secondary | ICD-10-CM | POA: Diagnosis not present

## 2023-06-14 NOTE — Progress Notes (Signed)
Allen, Reese ([id number]) [id number] Page 1 of 6 Visit Report for 06/14/2023 Arrival Information Details Patient Name: Date of Service: [name], DA [name]. 06/14/2023 2:00 PM Medical Record Number: [id number] Patient Account Number: [id number] Date of Birth/Sex: Treating RN: [birth year] (72 y.o. M) Primary Care Romel Dumond: [name] NDI Other Clinician: Referring Kel Senn: Treating Silvino Selman/Extender: Duanne Guess HA RRIS, BRA NDI Weeks in Treatment: 3 Visit Information History Since Last Visit Added or deleted any medications: No Patient Arrived: Wheel Chair Any new allergies or adverse reactions: No Arrival Time: 14:01 Had a fall or experienced change in No Accompanied By: self activities of daily living that may affect Transfer Assistance: None risk of falls: Patient Identification Verified: Yes Signs or symptoms of abuse/neglect since last visito No Secondary Verification Process Completed: Yes Hospitalized since last visit: No Patient Requires Transmission-Based Precautions: No Implantable device outside of the clinic excluding No Patient Has Alerts: No cellular tissue based products placed in the center since last visit: Has Dressing in Place as Prescribed: Yes Has Compression in Place as Prescribed: Yes Pain Present Now: No Electronic Signature(s) Signed: 06/14/2023 5:11:40 PM By: Thayer Dallas Entered By: Thayer Dallas on 06/14/2023 14:02:46 -------------------------------------------------------------------------------- Clinic Level of Care Assessment Details Patient Name: Date of Service: [name], DA [name]. 06/14/2023 2:00 PM Medical Record Number: [id number] Patient Account Number: [id number] Date of Birth/Sex: Treating RN: Jul 31, 1951 (72 y.o. M) Primary Care Jeryn Cerney: [name] NDI Other Clinician: Thayer Dallas Referring Jamee Pacholski: Treating Melena Hayes/Extender: Duanne Guess HA RRIS, BRA NDI Weeks in Treatment: 3 Clinic  Level of Care Assessment Items TOOL 4 Quantity Score []  - 0 Use when only an EandM is performed on FOLLOW-UP visit ASSESSMENTS - Nursing Assessment / Reassessment X- 1 10 Reassessment of Co-morbidities (includes updates in patient status) X- 1 5 Reassessment of Adherence to Treatment Plan ASSESSMENTS - Wound and Skin A ssessment / Reassessment []  - 0 Simple Wound Assessment / Reassessment - one wound []  - 0 Complex Wound Assessment / Reassessment - multiple wounds []  - 0 Dermatologic / Skin Assessment (not related to wound area) ASSESSMENTS - Focused Assessment []  - 0 Circumferential Edema Measurements - multi extremities []  - 0 Nutritional Assessment / Counseling / Intervention ANIAS, RUMMER ([id number]) [id number] Page 2 of 6 []  - 0 Lower Extremity Assessment (monofilament, tuning fork, pulses) []  - 0 Peripheral Arterial Disease Assessment (using hand held doppler) ASSESSMENTS - Ostomy and/or Continence Assessment and Care []  - 0 Incontinence Assessment and Management []  - 0 Ostomy Care Assessment and Management (repouching, etc.) PROCESS - Coordination of Care X - Simple Patient / Family Education for ongoing care 1 15 []  - 0 Complex (extensive) Patient / Family Education for ongoing care []  - 0 Staff obtains Chiropractor, Records, T Results / Process Orders est []  - 0 Staff telephones HHA, Nursing Homes / Clarify orders / etc []  - 0 Routine Transfer to another Facility (non-emergent condition) []  - 0 Routine Hospital Admission (non-emergent condition) []  - 0 New Admissions / Manufacturing engineer / Ordering NPWT Apligraf, etc. , []  - 0 Emergency Hospital Admission (emergent condition) X- 1 10 Simple Discharge Coordination []  - 0 Complex (extensive) Discharge Coordination PROCESS - Special Needs []  - 0 Pediatric / Minor Patient Management []  - 0 Isolation Patient Management []  - 0 Hearing / Language / Visual special needs []  -  0 Assessment of Community assistance (transportation, D/C planning, etc.) []  - 0 Additional assistance / Altered mentation []  - 0 Support Surface(s) Assessment (  bed, cushion, seat, etc.) INTERVENTIONS - Wound Cleansing / Measurement X - Simple Wound Cleansing - one wound 1 5 []  - 0 Complex Wound Cleansing - multiple wounds []  - 0 Wound Imaging (photographs - any number of wounds) []  - 0 Wound Tracing (instead of photographs) []  - 0 Simple Wound Measurement - one wound []  - 0 Complex Wound Measurement - multiple wounds INTERVENTIONS - Wound Dressings X - Small Wound Dressing one or multiple wounds 1 10 []  - 0 Medium Wound Dressing one or multiple wounds []  - 0 Large Wound Dressing one or multiple wounds []  - 0 Application of Medications - topical []  - 0 Application of Medications - injection INTERVENTIONS - Miscellaneous []  - 0 External ear exam []  - 0 Specimen Collection (cultures, biopsies, blood, body fluids, etc.) []  - 0 Specimen(s) / Culture(s) sent or taken to Lab for analysis []  - 0 Patient Transfer (multiple staff / Nurse, adult / Similar devices) []  - 0 Simple Staple / Suture removal (25 or less) []  - 0 Complex Staple / Suture removal (26 or more) []  - 0 Hypo / Hyperglycemic Management (close monitor of Blood Glucose) [name] ([id number]) [id number] Page 3 of 6 []  - 0 Ankle / Brachial Index (ABI) - do not check if billed separately []  - 0 Vital Signs Has the patient been seen at the hospital within the last three years: Yes Total Score: 55 Level Of Care: New/Established - Level 2 Electronic Signature(s) Signed: 06/14/2023 5:11:40 PM By: Thayer Dallas Entered By: Thayer Dallas on 06/14/2023 16:51:18 -------------------------------------------------------------------------------- Encounter Discharge Information Details Patient Name: Date of Service: [name], DA [name]. 06/14/2023 2:00 PM Medical Record Number:  [id number] Patient Account Number: [id number] Date of Birth/Sex: Treating RN: March 17, 1951 (72 y.o. M) Primary Care Maurie Olesen: [name] NDI Other Clinician: Thayer Dallas Referring Shanaia Sievers: Treating Danyetta Gillham/Extender: Duanne Guess HA RRIS, BRA NDI Weeks in Treatment: 3 Encounter Discharge Information Items Discharge Condition: Stable Ambulatory Status: Wheelchair Discharge Destination: Home Transportation: Private Auto Accompanied By: self Schedule Follow-up Appointment: Yes Clinical Summary of Care: Electronic Signature(s) Signed: 06/14/2023 5:11:40 PM By: Thayer Dallas Entered By: Thayer Dallas on 06/14/2023 16:53:48 -------------------------------------------------------------------------------- Patient/Caregiver Education Details Patient Name: Date of Service: [name], DA [name]. 7/2/2024andnbsp2:00 PM Medical Record Number: [id number] Patient Account Number: [id number] Date of Birth/Gender: Treating RN: [birth year] (72 y.o. M) Primary Care Physician: [name] NDI Other Clinician: Thayer Dallas Referring Physician: Treating Physician/Extender: Katina Degree NDI Weeks in Treatment: 3 Education Assessment Education Provided To: Patient Education Topics Provided Electronic Signature(s) Signed: 06/14/2023 5:11:40 PM By: Thayer Dallas Entered By: Thayer Dallas on 06/14/2023 16:53:27 [name] ([id number]) [id number] Page 4 of 6 -------------------------------------------------------------------------------- Wound Assessment Details Patient Name: Date of Service: [name], DA [name]. 06/14/2023 2:00 PM Medical Record Number: [id number] Patient Account Number: [id number] Date of Birth/Sex: Treating RN: 31-May-1951 (72 y.o. 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 Wound Status: Open Wounding Event: Blister Date Acquired: 05/11/2023 Weeks Of Treatment: 3 Clustered Wound: No Wound Measurements Length: (cm) 0.8 Width: (cm) 0.5 Depth: (cm) 0.1 Area: (cm) 0.314 Volume: (cm) 0.031 % Reduction in Area: 92% % Reduction in Volume: 92.1% Wound Description Classification: Grade 1 Exudate Amount: Medium Exudate Type: Serosanguineous Exudate Color:  red, brown Periwound Skin Texture Texture Color No Abnormalities Noted: No No Abnormalities Noted: No Moisture No Abnormalities Noted: No Treatment Notes Wound #14 (Amputation Site - Below Knee) Wound Laterality: Left Cleanser Vashe 5.8 (oz) Discharge Instruction: Cleanse the wound with Vashe prior to applying a clean dressing using gauze sponges, not tissue or cotton balls. Peri-Wound Care Topical Primary Dressing Hydrofera Blue Ready Transfer Foam, 4x5 (in/in) Discharge Instruction: Apply to wound bed as instructed three times a day Secondary Dressing Zetuvit Plus Silicone Border Dressing 4x4 (in/in) Discharge Instruction: Apply silicone border over primary dressing as directed. Secured With Compression Wrap Tubigrip D Discharge Instruction: Wear until Juxtalites come in. May wash as documented above. Compression Stockings Add-Ons Electronic Signature(s) Signed: 06/14/2023 5:11:40 PM By: Leisa Lenz, Rande Brunt ([id number]) [id number] Page 5 of 6 Entered By: Thayer Dallas on 06/14/2023 16:49:44 -------------------------------------------------------------------------------- Wound Assessment Details Patient Name: Date of Service: [name], DA [name]. 06/14/2023 2:00 PM Medical Record Number: [id number] Patient Account Number: [id number] Date of Birth/Sex: Treating RN: 1951-08-11 (72 y.o. 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, Proximal, Medial Amputation Site - Below Wound Status: Open Knee Wounding Event: Blister Date Acquired: 04/13/2023 Weeks Of Treatment: 3 Clustered Wound: No Wound Measurements Length: (cm) 0.9 Width: (cm) 1.2 Depth: (cm) 0.1 Area: (cm) 0.848 Volume: (cm) 0.085 % Reduction in Area: 73% % Reduction in Volume: 72.9% Wound Description Classification: Grade 1 Exudate Amount: Medium Exudate Type: Serosanguineous Exudate Color: red, brown Periwound Skin Texture Texture Color No Abnormalities Noted: No No Abnormalities Noted: No Moisture No Abnormalities Noted: No Treatment Notes Wound #15 (Amputation Site - Below Knee) Wound Laterality: Left, Medial, Proximal Cleanser Vashe 5.8 (oz) Discharge Instruction: Cleanse the wound with Vashe prior to applying a clean dressing using gauze sponges, not tissue or cotton balls. Peri-Wound Care Topical Primary Dressing Hydrofera Blue Ready Transfer Foam, 4x5 (in/in) Discharge Instruction: Apply to wound bed as instructed three times a day Secondary Dressing Zetuvit Plus Silicone Border Dressing 4x4 (in/in) Discharge Instruction: Apply silicone border over primary dressing as directed. Secured With Compression Wrap Tubigrip D Discharge Instruction: Wear until Juxtalites come in. May wash as documented above. Compression Stockings JAREB, TALK ([id number]) [id number] Page 6 of 6 Add-Ons Electronic Signature(s) Signed: 06/14/2023 5:11:40 PM By: Thayer Dallas Entered By: Thayer Dallas on 06/14/2023 16:49:44 -------------------------------------------------------------------------------- Vitals Details Patient Name: Date of Service: [name], DA [name]. 06/14/2023 2:00 PM Medical  Record Number: [id number] Patient Account Number: [id number] Date of Birth/Sex: Treating RN: 1951-07-10 (72 y.o. M) Primary Care Dafna Romo: [name] NDI Other Clinician: Referring Domanique Luckett: Treating Katlen Seyer/Extender: Duanne Guess HA RRIS, BRA NDI Weeks in Treatment: 3 Vital Signs Time Taken: 14:02 Reference Range: 80 - 120 mg / dl Electronic Signature(s) Signed: 06/14/2023 5:11:40 PM By: Thayer Dallas Entered By: Thayer Dallas on 06/14/2023 14:02:52

## 2023-06-15 NOTE — Progress Notes (Signed)
[name], [name] ([id number]) [id number] Page 1 of 1 Visit Report for 06/14/2023 SuperBill Details Patient Name: Date of Service: [name] 06/14/2023 Medical Record Number: [id number] Patient Account Number: [id number] Date of Birth/Sex: Treating RN: 14-Mar-1951 (72 y.o. 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

## 2023-06-20 ENCOUNTER — Encounter (HOSPITAL_BASED_OUTPATIENT_CLINIC_OR_DEPARTMENT_OTHER): Payer: 59 | Admitting: Internal Medicine

## 2023-06-20 DIAGNOSIS — I87323 Chronic venous hypertension (idiopathic) with inflammation of bilateral lower extremity: Secondary | ICD-10-CM

## 2023-06-20 DIAGNOSIS — E11622 Type 2 diabetes mellitus with other skin ulcer: Secondary | ICD-10-CM

## 2023-06-20 DIAGNOSIS — L97822 Non-pressure chronic ulcer of other part of left lower leg with fat layer exposed: Secondary | ICD-10-CM | POA: Diagnosis not present

## 2023-06-20 DIAGNOSIS — S81801A Unspecified open wound, right lower leg, initial encounter: Secondary | ICD-10-CM

## 2023-06-20 NOTE — Progress Notes (Signed)
STANDLEY, LOUISON ([id number]) [id number] Page 1 of 10 Visit Report for 06/20/2023 Chief Complaint Document Details Patient Name: Date of Service: [name], Allen [name] 06/20/2023 2:00 PM Medical Record Number: [id number] Patient Account Number: [id number] Date of Birth/Sex: Treating RN: [birth year] (72 y.o. M) Primary Care Provider: [name] NDI Other Clinician: Referring Provider: Treating Provider/Extender: Cannon Kettle, BRA NDI Weeks in Treatment: 3 Information Obtained from: Patient Chief Complaint 02/03/2022: The patient is here today for reevaluation of bilateral lower extremity edema and venous stasis dermatitis Electronic Signature(s) Signed: 06/20/2023 4:36:37 PM By: Geralyn Corwin DO Entered By: Geralyn Corwin on 06/20/2023 14:58:20 -------------------------------------------------------------------------------- HPI Details Patient Name: Date of Service: [name], Allen [name] 06/20/2023 2:00 PM Medical Record Number: [id number] Patient Account Number: [id number] Date of Birth/Sex: Treating RN: 01-06-1951 (72 y.o. M) Primary Care Provider: [name] NDI Other Clinician: Referring Provider: Treating Provider/Extender: Geralyn Corwin HA RRIS, BRA NDI Weeks in Treatment: 3 History of Present Illness HPI Description: [name] is a 72 year old male with a past medical history of type 2 diabetes and essential hypertension who presents to the clinic today for bilateral lower extremity wounds. He was recently seen in the ED on 3/9 for bilateral lower extremity edema. He had a DVT study that was negative for clots. He was given triamcinolone cream and advised to elevate legs and obtain compression stockings. He has been using the triamcinolone cream however he is unable to use compression stockings due to difficulty of putting on. Patient states that for the past 2 years he has had opening and closing of weeping wounds to his legs  bilaterally. They spontaneously open and close. They are often open for several weeks before they heal spontaneously. He denies any purulent drainage, increased warmth or increased erythema to the skin. 4/21; patient presents for 1 week follow-up. The wounds on the right leg have healed however he has now 2 new wounds 1 on the anterior and posterior thigh that occurred from him scratching underneath the wrap. The left leg wound has improved and almost closed 4/28; patient presents for 1 week follow-up. He continues to have a wound on the right anterior leg that is showing signs of healing. The posterior right thigh has closed. He now has a new wound to the left leg that is limited to skin breakdown. 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 The wounds are occurring because he goes underneath the leg wraps and scratches creating new wounds. He has no complaints today. 5/5; patient presents for 1 week follow-up. He no longer has any open wounds to his lower extremities bilaterally. He continues to have scattered excoriation Reese from where he scratches. These are scabbed over. He states he is going to see dermatology this month. Overall he is doing well and is happy with his care. Readmission 6/10 Patient was followed for open wounds limited to skin breakdown to his lower extremities bilaterally 2/2 venous insufficiency. Once the wounds healed he was discharged with juxta lite compressions and these were placed in office. He was discharged on 5/5. He states that he has not taken the wraps off since discharge and has had them on for over a month. He did not want to take them off because they were very comfortable. He does not have any difficulty putting these on and off if he wanted to. He states that he does not take a shower  and just cleans the other parts of his body at the sink. He noticed that 2 days ago he was having some  drainage at the top of the wrap and called in to be evaluated. He states that he has not seen the wounds until the wraps were [name], [name] ([id number]) [id number] Page 2 of 10 taken off today in office. He denies pain or increased warmth or erythema to the area 6/27; patient presents for 2-week follow-up. He has had increased swelling and drainage to the wounds over the last week. He reports increased erythema to the area. He does not have pain however. He denies purulent drainage or fever/chills. 7/5; patient presents for 1 week follow-up. He was prescribed Keflex at last clinic visit and is currently taking this. He reports significant improvement to his leg swelling, tenderness and erythema. He denies signs of infection. 7/21; patient presents for 2-week follow-up. He reports improvement in healing in his right lower extremity. Unfortunately this morning he noticed skin breakdown to his left lower extremity with increased redness and warmth. 7/28; patient presents for 1 week follow-up. He reports completing his antibiotics. He states that his wounds are healed. He has been using his juxta light compressions. He has no issues or complaints today. Readmission 12/17/2021; This is a 72 year old man who has been seen twice by Dr. Mikey Bussing for wounds on his bilateral lower extremities secondary to chronic venous insufficiency. Most recently this was from 05/22/2021 through 07/09/2021 with bilateral lower extremity wounds largely secondary to chronic venous insufficiency with stasis dermatitis. He has been discharged with juxta lite stockings. The patient tells me that about a month ago he developed too much edema to get his juxta lites on. He developed increased swelling. He saw his primary physician who gave him Lasix and the swelling seems to have come down. 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 He says this dates  back into his adolescence and he could not walk barefoot etc. As a result of this he sometimes leaves his shoes and socks on for weeks and he is done that recently.[name] He arrives in clinic with not any wounds on his legs however with close inspection he had a fairly deep wound in the left second and third toe webspace His skin on his feet was not in good shape macerated flaking malodorous The patient's past medical history has not changed. I note that he had arterial studies done on 06/03/2021 that showed an ABI on the right of 1.26 with triphasic waveforms TBI of 0.63 on the right on the left ABI of 0.85 TBI of 0.63 again with triphasic waveforms 1/19; since the patient was last here he was seen in the hospital for bradycardia. He had his medications adjusted digoxin metoprolol. He tells me they dressed the wound between the left second and third toe. The patient says that he had open wounds on his dorsal foot when we took off the dressing today. I was not exactly sure what he had on their. He had 2 wounds on the left dorsal foot with some significant surrounding erythema.The wound between the left second and third toe was just about closed however when we opened it to examine it today it it opened and a small area. The skin on his lower legs especially the left looks a lot better using triamcinolone and Cetaphil which she is applying He was on cephalexin prior described I think by primary care before he came  here he completed this about 3 or 4 days ago 1/26; the area between the left second and third toes is healed. He still has 2 small areas on the left dorsal foot. The erythema that I marked around this last week seems better. He is completing his doxycycline. I also gave him triamcinolone and Cetaphil last week for the dry flaking irritated inflamed skin on his dorsal feet and lower legs this all seems better. He has been wearing his juxta lite stockings 2/2; everything is healed here. He is now  treating his skin with I think both the triamcinolone and Cetaphil mixture that I prescribed and probably pure triamcinolone that the dermatologist prescribed. I told him to use my compounded steroid cream as a moisture and anti-itch and inflammation cream when he is finished without to go to a pure moisturizer I emphasized that steroids under compression even if its a juxta lite stocking can get absorbed and secondarily can cause skin atrophy. I asked him not to use both. On an optimistic front he seems a lot more interested in treating his skin and watching for areas on his feet that are breaking down. His skin looks a lot better he has a juxta lite stocking 2/14; patient we discharged 2 weeks ago. He has noted increasing edema in the right greater than left leg erythema and intense pruritus. Arrives in clinic really with no open wound however considerable amount of Reese on his upper right leg from scratching. He claims compliance with his juxta lites up with up to a week ago on the right. He has been wearing 1 on the left. He has home health nurse put kerlix on this 02/03/2022: No open wounds. He has been in 3 layer compression, which is slipped down and caused some "mushrooming" affect from just below the tibial tuberosity to the knee. The excoriations on his legs have resolved. He reports being compliant with moisturizing and elevation of his legs. He saw his cardiologist yesterday and was prescribed 20 mg of Lasix to be used on an as-needed basis. Readmission 06/01/2022 [name] is a 72 year old male with a past medical history of venous insufficiency, lymphedema, type 2 diabetes that presents to the clinic for a 1 month history of worsening leg wounds. He states he has not been wearing his compression wraps for the past 2 months. He states his Lasix was stopped by his primary doctor and then his legs became more swollen and he could not wear his compression stockings. He states  eventually he was put back on Lasix. He still has been unable to wear his compression stockings and although his swelling has improved slightly his wounds have not improved. He has increased warmth and erythema to the right lower extremity. 6/29; patient presents for follow-up. He completed his course of Keflex without issues. We have been using silver alginate with antibiotic ointment under compression therapy. He has no issues or complaints today. He has his juxta lite compressions with him today. 7/7; patient presents for follow-up. We have been using silver alginate under 3 layer compression. Patient has done well with this. His wounds have healed. He states he is going to get custom garment compression wraps. For now he has his juxta lite compressions. 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 He states that he had  a shrinker on and when taken off it peeled back the skin. He has been healing however has 2 open areas still present. He has been keeping the area covered. He also reports open wounds to the right lower extremity but these have healed with Ace wraps. He does have juxta lite compression Velcro wraps at home. He has home health that changes his dressings. He currently denies signs of infection. 6/18; patient presents for follow-up. We have been using Hydrofera Blue to the wound beds under Tubigrip. Wounds are smaller. 7/8; patient presents for follow-up. We have been using Hydrofera Blue to the wound beds on the left stump under Tubigrip. His wounds of healed. He did have some areas of irritation where the tape pulled. He has developed a new wound to the right lower extremity. He has been sleeping in his chair. He developed more swelling and how has a new wound. Electronic  Signature(s) Signed: 06/20/2023 4:36:37 PM By: Geralyn Corwin DO Entered By: Geralyn Corwin on 06/20/2023 14:59:27 [name] ([id number]) [id number] Page 3 of 10 -------------------------------------------------------------------------------- Physical Exam Details Patient Name: Date of Service: [name], Allen [name] 06/20/2023 2:00 PM Medical Record Number: [id number] Patient Account Number: [id number] Date of Birth/Sex: Treating RN: Oct 31, 1951 (72 y.o. M) Primary Care Provider: [name] NDI Other Clinician: Referring Provider: Treating Provider/Extender: Geralyn Corwin HA RRIS, BRA NDI Weeks in Treatment: 3 Constitutional respirations regular, non-labored and within target range for patient.. Cardiovascular 2+ dorsalis pedis/posterior tibialis pulses. Psychiatric pleasant and cooperative. Notes T the left BKA stump there is epithelization to the previous wound site. Some scattered areas of irritation noted. o Right lower extremity: Venous stasis dermatitis. T the anterior aspect there is an open wound with granulation tissue throughout. 2+ pitting edema to the knee. o No signs of active surrounding soft tissue infection. Electronic Signature(s) Signed: 06/20/2023 4:36:37 PM By: Geralyn Corwin DO Entered By: Geralyn Corwin on 06/20/2023 15:01:01 -------------------------------------------------------------------------------- Physician Orders Details Patient Name: Date of Service: [name], Allen [name] 06/20/2023 2:00 PM Medical Record Number: [id number] Patient Account Number: [id number] Date of Birth/Sex: Treating RN: 1950/12/28 (72 y.o. [name] Primary Care Provider: [name] NDI Other Clinician: Referring Provider: Treating Provider/Extender: Cannon Kettle, BRA NDI Weeks in Treatment: 3 Verbal / Phone Orders: No Diagnosis Coding Follow-up Appointments ppointment in 1 week. - Dr. Mikey Bussing Return  A Anesthetic (In clinic) Topical Lidocaine 4% applied to wound bed Bathing/ Shower/ Hygiene May shower with protection but do not get wound dressing(s) wet. Protect dressing(s) with water repellant cover (for example, large plastic bag) or a cast cover and may then take shower. Edema Control - Lymphedema / SCD / Other Bilateral Lower Extremities Compression stocking or Garment 20-30 mm/Hg pressure to: Other Edema Control Orders/Instructions: - Order Juxtalites 20/30. Wear Tubigrip until Juxtalites come in. Home Health No change in wound care orders this week; continue Home Health for wound care. May utilize formulary equivalent dressing for wound treatment orders unless otherwise specified. - x1 a week by home health and wound center weekly. Other Home Health Orders/Instructions: [facility] Wound Treatment Wound #16 - Lower Leg Wound Laterality: Right Topical: Mupirocin Ointment BLAIN, GUETTER ([id number]) [id number] Page 4 of 10 Discharge Instructions: Apply Mupirocin (Bactroban) as instructed Prim Dressing: Promogran Prisma Matrix, 4.34 (sq in) (silver collagen) ary Discharge Instructions: Moisten collagen with saline or hydrogel Compression Wrap: ThreePress (3 layer compression wrap) Discharge Instructions: Apply three layer compression as directed. Electronic Signature(s)  Signed: 06/20/2023 4:36:37 PM By: Geralyn Corwin DO Entered By: Geralyn Corwin on 06/20/2023 15:01:10 -------------------------------------------------------------------------------- Problem List Details Patient Name: Date of Service: [name], Allen [name] 06/20/2023 2:00 PM Medical Record Number: [id number] Patient Account Number: [id number] Date of Birth/Sex: Treating RN: 1950/12/14 (72 y.o. [name] Primary Care Provider: [name] NDI Other Clinician: Referring Provider: Treating Provider/Extender: Galen Manila NDI Weeks in Treatment:  3 Active Problems ICD-10 Encounter Code Description Active Date MDM Diagnosis L97.822 Non-pressure chronic ulcer of other part of left lower leg with fat layer exposed6/10/2023 No Yes S81.801A Unspecified open wound, right lower leg, initial encounter 06/20/2023 No Yes E11.622 Type 2 diabetes mellitus with other skin ulcer 05/24/2023 No Yes Z89.512 Acquired absence of left leg below knee 05/24/2023 No Yes I87.323 Chronic venous hypertension (idiopathic) with inflammation of bilateral lower 05/24/2023 No Yes extremity I89.0 Lymphedema, not elsewhere classified 05/24/2023 No Yes Inactive Problems Resolved Problems Electronic Signature(s) Signed: 06/20/2023 4:36:37 PM By: Geralyn Corwin DO Entered By: Geralyn Corwin on 06/20/2023 14:58:03 [name], [name] ([id number]) [id number] Page 5 of 10 -------------------------------------------------------------------------------- Progress Note Details Patient Name: Date of Service: [name], Allen [name] 06/20/2023 2:00 PM Medical Record Number: [id number] Patient Account Number: [id number] Date of Birth/Sex: Treating RN: Feb 08, 1951 (72 y.o. M) Primary Care Provider: [name] NDI Other Clinician: Referring Provider: Treating Provider/Extender: Cannon Kettle, BRA NDI Weeks in Treatment: 3 Subjective Chief Complaint Information obtained from Patient 02/03/2022: The patient is here today for reevaluation of bilateral lower extremity edema and venous stasis dermatitis History of Present Illness (HPI) Jahaad Reppert is a 72 year old male with a past medical history of type 2 diabetes and essential hypertension who presents to the clinic today for bilateral lower extremity wounds. He was recently seen in the ED on 3/9 for bilateral lower extremity edema. He had a DVT study that was negative for clots. He was given triamcinolone cream and advised to elevate legs and obtain compression stockings. He has been using  the triamcinolone cream however he is unable to use compression stockings due to difficulty of putting on. Patient states that for the past 2 years he has had opening and closing of weeping wounds to his legs bilaterally. They spontaneously open and close. They are often open for several weeks before they heal spontaneously. He denies any purulent drainage, increased warmth or increased erythema to the skin. 4/21; patient presents for 1 week follow-up. The wounds on the right leg have healed however he has now 2 new wounds 1 on the anterior and posterior thigh that occurred from him scratching underneath the wrap. The left leg wound has improved and almost closed 4/28; patient presents for 1 week follow-up. He continues to have a wound on the right anterior leg that is showing signs of healing. The posterior right thigh has closed. He now has a new wound to the left leg that is limited to skin breakdown. 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 The wounds are occurring because he goes underneath the leg wraps and scratches creating new wounds. He has no complaints today. 5/5; patient presents for 1 week follow-up. He no longer has any open wounds to his lower extremities bilaterally. He continues to have scattered excoriation Reese from where he scratches. These are scabbed over. He states he is going to see dermatology this month. Overall he is doing well and is happy  with his care. Readmission 6/10 Patient was followed for open wounds limited to skin breakdown to his lower extremities bilaterally 2/2 venous insufficiency. Once the wounds healed he was discharged with juxta lite compressions and these were placed in office. He was discharged on 5/5. He states that he has not taken the wraps off since discharge and has had them on for over a month. He did not want to take them off because they were very comfortable. He does not  have any difficulty putting these on and off if he wanted to. He states that he does not take a shower and just cleans the other parts of his body at the sink. He noticed that 2 days ago he was having some drainage at the top of the wrap and called in to be evaluated. He states that he has not seen the wounds until the wraps were taken off today in office. He denies pain or increased warmth or erythema to the area 6/27; patient presents for 2-week follow-up. He has had increased swelling and drainage to the wounds over the last week. He reports increased erythema to the area. He does not have pain however. He denies purulent drainage or fever/chills. 7/5; patient presents for 1 week follow-up. He was prescribed Keflex at last clinic visit and is currently taking this. He reports significant improvement to his leg swelling, tenderness and erythema. He denies signs of infection. 7/21; patient presents for 2-week follow-up. He reports improvement in healing in his right lower extremity. Unfortunately this morning he noticed skin breakdown to his left lower extremity with increased redness and warmth. 7/28; patient presents for 1 week follow-up. He reports completing his antibiotics. He states that his wounds are healed. He has been using his juxta light compressions. He has no issues or complaints today. Readmission 12/17/2021; This is a 71 year old man who has been seen twice by Dr. Mikey Bussing for wounds on his bilateral lower extremities secondary to chronic venous insufficiency. Most recently this was from 05/22/2021 through 07/09/2021 with bilateral lower extremity wounds largely secondary to chronic venous insufficiency with stasis dermatitis. He has been discharged with juxta lite stockings. The patient tells me that about a month ago he developed too much edema to get his juxta lites on. He developed increased swelling. He saw his primary physician who gave him Lasix and the swelling seems to have  come down. 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 He says this dates back into his adolescence and he could not walk barefoot etc. As a result of this he sometimes leaves his shoes and socks on for weeks and he is done that recently.[name] He arrives in clinic with not any wounds on his legs however with close inspection he had a fairly deep wound in the left second and third toe webspace His skin on his feet was not in good shape macerated flaking malodorous The patient's past medical history has not changed. I note that he had arterial studies done on 06/03/2021 that showed an ABI on the right of 1.26 with triphasic waveforms TBI of 0.63 on the right on the left ABI of 0.85 TBI of 0.63 again with triphasic waveforms 1/19; since the patient was last here he was seen in the hospital for bradycardia. He had his medications adjusted digoxin metoprolol. He tells me they dressed SYIRE, DACKO ([id number]) [id number] Page 6 of 10 the wound between the left second and third toe. The patient says  that he had open wounds on his dorsal foot when we took off the dressing today. I was not exactly sure what he had on their. He had 2 wounds on the left dorsal foot with some significant surrounding erythema.The wound between the left second and third toe was just about closed however when we opened it to examine it today it it opened and a small area. The skin on his lower legs especially the left looks a lot better using triamcinolone and Cetaphil which she is applying He was on cephalexin prior described I think by primary care before he came here he completed this about 3 or 4 days ago 1/26; the area between the left second and third toes is healed. He still has 2 small areas on the left dorsal foot. The erythema that I marked around this last week seems better. He is completing his doxycycline. I also gave him triamcinolone and  Cetaphil last week for the dry flaking irritated inflamed skin on his dorsal feet and lower legs this all seems better. He has been wearing his juxta lite stockings 2/2; everything is healed here. He is now treating his skin with I think both the triamcinolone and Cetaphil mixture that I prescribed and probably pure triamcinolone that the dermatologist prescribed. I told him to use my compounded steroid cream as a moisture and anti-itch and inflammation cream when he is finished without to go to a pure moisturizer I emphasized that steroids under compression even if its a juxta lite stocking can get absorbed and secondarily can cause skin atrophy. I asked him not to use both. On an optimistic front he seems a lot more interested in treating his skin and watching for areas on his feet that are breaking down. His skin looks a lot better he has a juxta lite stocking 2/14; patient we discharged 2 weeks ago. He has noted increasing edema in the right greater than left leg erythema and intense pruritus. Arrives in clinic really with no open wound however considerable amount of Reese on his upper right leg from scratching. He claims compliance with his juxta lites up with up to a week ago on the right. He has been wearing 1 on the left. He has home health nurse put kerlix on this 02/03/2022: No open wounds. He has been in 3 layer compression, which is slipped down and caused some "mushrooming" affect from just below the tibial tuberosity to the knee. The excoriations on his legs have resolved. He reports being compliant with moisturizing and elevation of his legs. He saw his cardiologist yesterday and was prescribed 20 mg of Lasix to be used on an as-needed basis. Readmission 06/01/2022 Mr. Mohamadou Roughton is a 72 year old male with a past medical history of venous insufficiency, lymphedema, type 2 diabetes that presents to the clinic for a 1 month history of worsening leg wounds. He states he has not been  wearing his compression wraps for the past 2 months. He states his Lasix was stopped by his primary doctor and then his legs became more swollen and he could not wear his compression stockings. He states eventually he was put back on Lasix. He still has been unable to wear his compression stockings and although his swelling has improved slightly his wounds have not improved. He has increased warmth and erythema to the right lower extremity. 6/29; patient presents for follow-up. He completed his course of Keflex without issues. We have been using silver alginate with antibiotic ointment under compression therapy. He  has no issues or complaints today. He has his juxta lite compressions with him today. 7/7; patient presents for follow-up. We have been using silver alginate under 3 layer compression. Patient has done well with this. His wounds have healed. He states he is going to get custom garment compression wraps. For now he has his juxta lite compressions. 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 He states that he had a shrinker on and when taken off it peeled back the skin. He has been healing however has 2 open areas still present. He has been keeping the area covered. He also reports open wounds to the right lower extremity but these have healed with Ace wraps. He does have juxta lite compression Velcro wraps at home. He has home health that changes his dressings. He currently denies signs of infection. 6/18; patient presents for follow-up. We have been using Hydrofera Blue to the wound beds under Tubigrip. Wounds are smaller. 7/8; patient presents for follow-up. We have been using Hydrofera Blue to the wound beds on the left stump under Tubigrip. His wounds of healed. He did have some areas  of irritation where the tape pulled. He has developed a new wound to the right lower extremity. He has been sleeping in his chair. He developed more swelling and how has a new wound. Patient History Unable to Obtain Patient History due to Altered Mental Status. Information obtained from Patient. Family History Cancer - Mother,Maternal Grandparents, Heart Disease - Maternal Grandparents, Hypertension - Maternal Grandparents, No family history of Diabetes, Hereditary Spherocytosis. Social History Former smoker - ended on 09/14/1983, Marital Status - Single, Alcohol Use - Rarely, Drug Use - No History, Caffeine Use - Never. Medical History Eyes Patient has history of Cataracts - 2014 Hematologic/Lymphatic Patient has history of Lymphedema Cardiovascular Patient has history of Arrhythmia - A-Fib, Peripheral Venous Disease Endocrine Patient has history of Type II Diabetes Musculoskeletal Patient has history of Osteoarthritis Neurologic Patient has history of Neuropathy Denies history of Quadriplegia, Paraplegia, Seizure Disorder Oncologic Denies history of Received Chemotherapy, Received Radiation Psychiatric Denies history of Anorexia/bulimia, Confinement Anxiety Hospitalization/Surgery History - colon resection. - appendectomy. - multiple tooth extraction. Medical A Surgical History Notes nd Hematologic/Lymphatic Hyperbilirubinemia Gastrointestinal Rectal Pain-Constipation Genitourinary CKD stage 3-BPH Oncologic Colon Cancer-Colon Re-section [name], [name] ([id number]) [id number] Page 7 of 10 Objective Constitutional respirations regular, non-labored and within target range for patient.. Vitals Time Taken: 1:57 PM, Temperature: 98.6 F, Pulse: 80 bpm, Respiratory Rate: 18 breaths/min, Blood Pressure: 125/72 mmHg, Capillary Blood Glucose: 135 mg/dl. Cardiovascular 2+ dorsalis pedis/posterior tibialis pulses. Psychiatric pleasant and  cooperative. General Notes: T the left BKA stump there is epithelization to the previous wound site. Some scattered areas of irritation noted. Right lower extremity: Venous o stasis dermatitis. T the anterior aspect there is an open wound with granulation tissue throughout. 2+ pitting edema to the knee. No signs of active surrounding o soft tissue infection. Integumentary (Hair, Skin) Wound #14 status is Healed - Epithelialized. Original cause of wound was Blister. The date acquired was: 05/11/2023. The wound has been in treatment 3 weeks. The wound is located on the Left Amputation Site - Below Knee. The wound measures 0cm length x 0cm width x 0cm depth; 0cm^2 area and 0cm^3 volume. There is no tunneling or undermining noted. There is a medium  amount of serosanguineous drainage noted. Wound #15 status is Healed - Epithelialized. Original cause of wound was Blister. The date acquired was: 04/13/2023. The wound has been in treatment 3 weeks. The wound is located on the Left,Proximal,Medial Amputation Site - Below Knee. The wound measures 0cm length x 0cm width x 0cm depth; 0cm^2 area and 0cm^3 volume. There is no tunneling or undermining noted. There is a medium amount of serosanguineous drainage noted. Wound #16 status is Open. Original cause of wound was Gradually Appeared. The date acquired was: 06/14/2023. The wound is located on the Right Lower Leg. The wound measures 3cm length x 1.5cm width x 0.1cm depth; 3.534cm^2 area and 0.353cm^3 volume. There is Fat Layer (Subcutaneous Tissue) exposed. There is no tunneling or undermining noted. There is a medium amount of serosanguineous drainage noted. The wound margin is distinct with the outline attached to the wound base. There is medium (34-66%) red, pink granulation within the wound bed. There is a medium (34-66%) amount of necrotic tissue within the wound bed including Eschar and Adherent Slough. The periwound skin appearance exhibited: Scarring,  Hemosiderin Staining. The periwound skin appearance did not exhibit: Dry/Scaly, Maceration. Periwound temperature was noted as No Abnormality. 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 I recommended triamcinolone cream and this was prescribed today to the areas of irritation. Can continue with Tubigrip to help control swelling. Unfortunately has developed a new wound to the right lower extremity secondary to venous insufficiency. I recommended collagen under 3 layer compression. ABIs on the right were 0.84 with pedal pulses suggesting adequate blood flow for healing. Follow-up in 1 week. He knows to not get the wrap wet or keep this on for more than 7 days. Procedures Wound #16 Pre-procedure diagnosis of Wound #16 is a Lymphedema located on the Right Lower Leg . There was a Three Layer Compression Therapy Procedure by Brenton Grills, RN. Post procedure Diagnosis Wound #16: Same as Pre-Procedure Plan Follow-up Appointments: [name], [name] ([id number]) [id number] Page 8 of 10 Return Appointment in 1 week. - Dr. Mikey Bussing Anesthetic: (In clinic) Topical Lidocaine 4% applied to wound bed Bathing/ Shower/ Hygiene: May shower with protection but do not get wound dressing(s) wet. Protect dressing(s) with water repellant cover (for example, large plastic bag) or a cast cover and may then take shower. Edema Control - Lymphedema / SCD / Other: Compression stocking or Garment 20-30 mm/Hg pressure to: Other Edema Control Orders/Instructions: - Order Juxtalites 20/30. Wear Tubigrip  until Juxtalites come in. Home Health: No change in wound care orders this week; continue Home Health for wound care. May utilize formulary equivalent dressing for wound treatment orders unless otherwise specified. - x1 a week by home health and wound center weekly. Other Home Health Orders/Instructions: [facility] WOUND #16: - Lower Leg Wound Laterality: Right Topical: Mupirocin Ointment Discharge Instructions: Apply Mupirocin (Bactroban) as instructed Prim Dressing: Promogran Prisma Matrix, 4.34 (sq in) (silver collagen) ary Discharge Instructions: Moisten collagen with saline or hydrogel Com pression Wrap: ThreePress (3 layer compression wrap) Discharge Instructions: Apply three layer compression as directed. 1. Tubigrip to the left stump along with triamcinolone cream 2. Collagen under 3 layer compression to the  right lower extremity 3. Follow-up in 1 week Electronic Signature(s) Signed: 06/20/2023 4:36:37 PM By: Geralyn Corwin DO Entered By: Geralyn Corwin on 06/20/2023 15:02:50 -------------------------------------------------------------------------------- HxROS Details Patient Name: Date of Service: [name], Allen [name] 06/20/2023 2:00 PM Medical Record Number: [id number] Patient Account Number: [id number] Date of Birth/Sex: Treating RN: 1951-05-24 (72 y.o. M) Primary Care Provider: [name] NDI Other Clinician: Referring Provider: Treating Provider/Extender: Geralyn Corwin HA RRIS, BRA NDI Weeks in Treatment: 3 Unable to Obtain Patient History due to Altered Mental Status Information Obtained From Patient Eyes Medical History: Positive for: Cataracts - 2014 Hematologic/Lymphatic Medical History: Positive for: Lymphedema Past Medical History Notes: Hyperbilirubinemia Cardiovascular Medical History: Positive for: Arrhythmia - A-Fib; Peripheral Venous Disease Gastrointestinal Medical History: Past Medical History Notes: Rectal  Pain-Constipation Endocrine Medical HistoryDAIZON, PROPES ([id number]) [id number] Page 9 of 10 Positive for: Type II Diabetes Time with diabetes: Since 2014 Treated with: Oral agents Blood sugar tested every day: Yes Tested : Genitourinary Medical History: Past Medical History Notes: CKD stage 3-BPH Musculoskeletal Medical History: Positive for: Osteoarthritis Neurologic Medical History: Positive for: Neuropathy Negative for: Quadriplegia; Paraplegia; Seizure Disorder Oncologic Medical History: Negative for: Received Chemotherapy; Received Radiation Past Medical History Notes: Colon Cancer-Colon Re-section Psychiatric Medical History: Negative for: Anorexia/bulimia; Confinement Anxiety HBO Extended History Items Eyes: Cataracts Immunizations Pneumococcal Vaccine: Received Pneumococcal Vaccination: No Implantable Devices None Hospitalization / Surgery History Type of Hospitalization/Surgery colon resection appendectomy multiple tooth extraction Family and Social History Cancer: Yes - Mother,Maternal Grandparents; Diabetes: No; Heart Disease: Yes - Maternal Grandparents; Hereditary Spherocytosis: No; Hypertension: Yes - Maternal Grandparents; Former smoker - ended on 09/14/1983; Marital Status - Single; Alcohol Use: Rarely; Drug Use: No History; Caffeine Use: Never; Financial Concerns: No; Food, Clothing or Shelter Needs: No; Support System Lacking: No; Transportation Concerns: No Electronic Signature(s) Signed: 06/20/2023 4:36:37 PM By: Geralyn Corwin DO Entered By: Geralyn Corwin on 06/20/2023 14:59:34 -------------------------------------------------------------------------------- SuperBill Details Patient Name: Date of Service: [name], Allen [name] 06/20/2023 Medical Record Number: [id number] Patient Account Number: [id number] Date of Birth/Sex: Treating RN: June 18, 1951 (72 y.o. [name] Primary Care Provider: [name]  NDI Other Clinician: MAHMOOD, SOKOLOW ([id number]) [id number] Page 10 of 10 Referring Provider: Treating Provider/Extender: Galen Manila NDI Weeks in Treatment: 3 Diagnosis Coding ICD-10 Codes Code Description [phone] Non-pressure chronic ulcer of other part of left lower leg with fat layer exposed S81.801A Unspecified open wound, right lower leg, initial encounter E11.622 Type 2 diabetes mellitus with other skin ulcer Z89.512 Acquired absence of left leg below knee I87.323 Chronic venous hypertension (idiopathic) with inflammation of bilateral lower extremity I89.0 Lymphedema, not elsewhere classified Facility Procedures : CPT4 Code: [id number] Description: (Facility Use Only) [phone] - APPLY MULTLAY COMPRS LWR RT LEG Modifier: Quantity: 1 Physician Procedures : CPT4 Code Description Modifier [id number] - WC PHYS LEVEL 3 - EST PT ICD-10 Diagnosis Description L97.822 Non-pressure chronic ulcer of other part of left lower leg with fat layer exposed E11.622 Type 2 diabetes mellitus with other skin ulcer  S81.801A Unspecified open wound, right lower leg, initial encounter I87.323 Chronic venous hypertension (idiopathic) with inflammation of bilateral lower extremity Quantity: 1 Electronic Signature(s) Signed: 06/20/2023 4:36:37 PM By: Geralyn Corwin DO Entered By: Geralyn Corwin on 06/20/2023 15:03:59

## 2023-06-22 NOTE — Progress Notes (Signed)
TRAYCE, MAINO ([id number]) [id number] Page 1 of 9 Visit Report for 06/20/2023 Arrival Information Details Patient Name: Date of Service: Ardis Hughs, [name] 06/20/2023 2:00 PM Medical Record Number: [id number] Patient Account Number: [id number] Date of Birth/Sex: Treating RN: [birth year] (72 y.o. [name] Primary Care Lacie Landry: [name] NDI Other Clinician: Referring Nikeisha Klutz: Treating Price Lachapelle/Extender: Geralyn Corwin HA RRIS, BRA NDI Weeks in Treatment: 3 Visit Information History Since Last Visit All ordered tests and consults were completed: Yes Patient Arrived: Wheel Chair Added or deleted any medications: No Arrival Time: 13:55 Any new allergies or adverse reactions: No Accompanied By: self Had a fall or experienced change in No Transfer Assistance: None activities of daily living that may affect Patient Identification Verified: Yes risk of falls: Secondary Verification Process Completed: Yes Signs or symptoms of abuse/neglect since last visito No Patient Requires Transmission-Based Precautions: No Hospitalized since last visit: No Patient Has Alerts: No Implantable device outside of the clinic excluding No cellular tissue based products placed in the center since last visit: Has Dressing in Place as Prescribed: Yes Pain Present Now: No Electronic Signature(s) Signed: 06/22/2023 4:28:57 PM By: Brenton Grills Entered By: Brenton Grills on 06/20/2023 13:57:25 -------------------------------------------------------------------------------- Compression Therapy Details Patient Name: Date of Service: Ardis Hughs, [name] 06/20/2023 2:00 PM Medical Record Number: [id number] Patient Account Number: [id number] Date of Birth/Sex: Treating RN: 1951/06/24 (72 y.o. [name] Primary Care Skyann Ganim: [name] NDI Other Clinician: Referring Fatma Rutten: Treating Tierra Thoma/Extender: Cannon Kettle, BRA NDI Weeks in Treatment:  3 Compression Therapy Performed for Wound Assessment: Wound #16 Right Lower Leg Performed By: Clinician Brenton Grills, RN Compression Type: Three Layer Post Procedure Diagnosis Same as Pre-procedure Electronic Signature(s) Signed: 06/22/2023 4:28:57 PM By: Brenton Grills Entered By: Brenton Grills on 06/20/2023 14:39:00 [name] ([id number]) [id number] Page 2 of 9 -------------------------------------------------------------------------------- Encounter Discharge Information Details Patient Name: Date of Service: Ardis Hughs, [place] [name] 06/20/2023 2:00 PM Medical Record Number: [id number] Patient Account Number: [id number] Date of Birth/Sex: Treating RN: September 09, 1951 (72 y.o. [name] Primary Care Schylar Allard: [name] NDI Other Clinician: Referring Lashe Oliveira: Treating Haadi Santellan/Extender: Geralyn Corwin HA RRIS, BRA NDI Weeks in Treatment: 3 Encounter Discharge Information Items Discharge Condition: Stable Ambulatory Status: Wheelchair Discharge Destination: Home Transportation: Private Auto Accompanied By: self Schedule Follow-up Appointment: Yes Clinical Summary of Care: Patient Declined Electronic Signature(s) Signed: 06/22/2023 4:28:57 PM By: Brenton Grills Entered By: Brenton Grills on 06/20/2023 14:59:17 -------------------------------------------------------------------------------- Lower Extremity Assessment Details Patient Name: Date of Service: Ardis Hughs, [name] 06/20/2023 2:00 PM Medical Record Number: [id number] Patient Account Number: [id number] Date of Birth/Sex: Treating RN: August 05, 1951 (72 y.o. [name] Primary Care Paizlee Kinder: [name] NDI Other Clinician: Referring Anays Detore: Treating Melvyn Hommes/Extender: Geralyn Corwin HA RRIS, BRA NDI Weeks in Treatment: 3 Electronic Signature(s) Signed: 06/22/2023 4:28:57 PM By: Brenton Grills Entered By: Brenton Grills on 06/20/2023  14:05:44 -------------------------------------------------------------------------------- Multi Wound Chart Details Patient Name: Date of Service: Ardis Hughs, [name] 06/20/2023 2:00 PM Medical Record Number: [id number] Patient Account Number: [id number] Date of Birth/Sex: Treating RN: 04-19-1951 (72 y.o. M) Primary Care Tien Spooner: [name] NDI Other Clinician: Referring Danell Vazquez: Treating Taiten Brawn/Extender: Geralyn Corwin HA RRIS, BRA NDI Weeks in Treatment: 3 Vital Signs Height(in): Capillary Blood Glucose(mg/dl): 517 Weight(lbs): Pulse(bpm): 80 Body Mass Index(BMI): Blood Pressure(mmHg): 125/72 Temperature(F): 98.6 Respiratory Rate(breaths/min): 18 [14:Photos:] [15:No Photos] [id number] Page 3 of 9] Left Amputation Site - Below Knee Left,  Proximal, Medial Amputation Site Right Lower Leg Wound Location: - Below Knee Blister Blister Gradually Appeared Wounding Event: Diabetic Wound/Ulcer of the Lower Diabetic Wound/Ulcer of the Lower Lymphedema Primary Etiology: Extremity Extremity Cataracts, Lymphedema, Arrhythmia, Cataracts, Lymphedema, Arrhythmia, Cataracts, Lymphedema, Arrhythmia, Comorbid History: Peripheral Venous Disease, Type II Peripheral Venous Disease, Type II Peripheral Venous Disease, Type II Diabetes, Osteoarthritis, Neuropathy Diabetes, Osteoarthritis, Neuropathy Diabetes, Osteoarthritis, Neuropathy 05/11/2023 04/13/2023 06/14/2023 Date Acquired: 3 3 0 Weeks of Treatment: Healed - Epithelialized Healed - Epithelialized Open Wound Status: No No No Wound Recurrence: 0x0x0 0x0x0 3x1.5x0.1 Measurements L x W x D (cm) 0 0 3.534 A (cm) : rea 0 0 0.353 Volume (cm) : 100.00% 100.00% N/A % Reduction in Area: 100.00% 100.00% N/A % Reduction in Volume: Grade 1 Grade 1 Full Thickness Without Exposed Classification: Support Structures Medium Medium Medium Exudate A mount: Serosanguineous Serosanguineous Serosanguineous Exudate  Type: red, brown red, brown red, brown Exudate Color: N/A N/A Distinct, outline attached Wound Margin: N/A N/A Medium (34-66%) Granulation A mount: N/A N/A Red, Pink Granulation Quality: N/A N/A Medium (34-66%) Necrotic A mount: N/A N/A Eschar, Adherent Slough Necrotic Tissue: N/A N/A Small (1-33%) Epithelialization: Scarring: Yes Periwound Skin Texture: Maceration: No Periwound Skin Moisture: Dry/Scaly: No Hemosiderin Staining: Yes Periwound Skin Color: N/A N/A No Abnormality Temperature: N/A N/A Compression Therapy Procedures Performed: Treatment Notes Electronic Signature(s) Signed: 06/20/2023 4:36:37 PM By: Geralyn Corwin DO Entered By: Geralyn Corwin on 06/20/2023 14:58:13 -------------------------------------------------------------------------------- Multi-Disciplinary Care Plan Details Patient Name: Date of Service: Ardis Hughs, [name] 06/20/2023 2:00 PM Medical Record Number: [id number] Patient Account Number: [id number] Date of Birth/Sex: Treating RN: May 31, 1951 (72 y.o. [name] Primary Care Zakaiya Lares: [name] NDI Other Clinician: Referring Berlynn Warsame: Treating Opha Mcghee/Extender: Geralyn Corwin HA RRIS, BRA NDI Weeks in Treatment: 3 Active Inactive Wound/Skin Impairment Nursing Diagnoses: Knowledge deficit related to smoking impact on wound healing Goals: Patient/caregiver will verbalize understanding of skin care regimen Date Initiated: 05/24/2023 Target Resolution Date: 07/13/2023 Goal Status: Active Interventions: CHAEL, URENDA ([id number]) [id number] Page 4 of 9 Assess patient/caregiver ability to obtain necessary supplies Assess patient/caregiver ability to perform ulcer/skin care regimen upon admission and as needed Assess ulceration(s) every visit Provide education on ulcer and skin care Screen for HBO Treatment Activities: Skin care regimen initiated : 05/24/2023 Topical wound management initiated :  05/24/2023 Notes: Electronic Signature(s) Signed: 06/22/2023 4:28:57 PM By: Brenton Grills Entered By: Brenton Grills on 06/20/2023 14:08:45 -------------------------------------------------------------------------------- Pain Assessment Details Patient Name: Date of Service: Ardis Hughs, [name] 06/20/2023 2:00 PM Medical Record Number: [id number] Patient Account Number: [id number] Date of Birth/Sex: Treating RN: 1951/02/07 (72 y.o. [name] Primary Care Pranay Hilbun: [name] NDI Other Clinician: Referring Laurren Lepkowski: Treating Brieana Shimmin/Extender: Cannon Kettle, BRA NDI Weeks in Treatment: 3 Active Problems Location of Pain Severity and Description of Pain Patient Has Paino No Site Locations Pain Management and Medication Current Pain Management: Electronic Signature(s) Signed: 06/22/2023 4:28:57 PM By: Brenton Grills Entered By: Brenton Grills on 06/20/2023 14:01:36 Patient/Caregiver Education Details -------------------------------------------------------------------------------- [name] ([id number]) [id number] Page 5 of 9 Patient Name: Date of Service: [name] 7/8/2024andnbsp2:00 PM Medical Record Number: [id number] Patient Account Number: [id number] Date of Birth/Gender: Treating RN: 1951/03/10 (72 y.o. [name] Primary Care Physician: [name] NDI Other Clinician: Referring Physician: Treating Physician/Extender: Cannon Kettle, BRA NDI Weeks in Treatment: 3 Education Assessment Education Provided To: Patient Education Topics Provided Wound/Skin Impairment: Methods: Explain/Verbal Responses:  State content correctly Electronic Signature(s) Signed: 06/22/2023 4:28:57 PM By: Brenton Grills Entered By: Brenton Grills on 06/20/2023 14:09:01 -------------------------------------------------------------------------------- Wound Assessment Details Patient Name: Date of Service: Ardis Hughs, DA  [name] 06/20/2023 2:00 PM Medical Record Number: [id number] Patient Account Number: [id number] Date of Birth/Sex: Treating RN: 1951-03-07 (72 y.o. [name] Primary Care Teria Khachatryan: [name] NDI Other Clinician: Referring Airam Runions: Treating Jaylah Goodlow/Extender: Geralyn Corwin HA RRIS, BRA NDI Weeks in Treatment: 3 Wound Status Wound Number: 14 Primary Diabetic Wound/Ulcer of the Lower Extremity Etiology: Wound Location: Left Amputation Site - Below Knee Wound Healed - Epithelialized Wounding Event: Blister Status: Date Acquired: 05/11/2023 Comorbid Cataracts, Lymphedema, Arrhythmia, Peripheral Venous Disease, Weeks Of Treatment: 3 History: Type II Diabetes, Osteoarthritis, Neuropathy Clustered Wound: No Photos Wound Measurements Length: (cm) Width: (cm) Depth: (cm) Area: (cm) Volume: (cm) 0 % Reduction in Area: 100% 0 % Reduction in Volume: 100% 0 Tunneling: No 0 Undermining: No 0 Wound Description Classification: Grade 1 Exudate Amount: Medium Exudate Type: Serosanguineous Claud, Saturnino L ([id number]) Exudate Color: red, brown [id number] Page 6 of 9 Periwound Skin Texture Texture Color No Abnormalities Noted: No No Abnormalities Noted: No Moisture No Abnormalities Noted: No Treatment Notes Wound #14 (Amputation Site - Below Knee) Wound Laterality: Left Cleanser Peri-Wound Care Topical Primary Dressing Secondary Dressing Secured With Compression Wrap Compression Stockings Add-Ons Electronic Signature(s) Signed: 06/22/2023 4:28:57 PM By: Brenton Grills Entered By: Brenton Grills on 06/20/2023 14:34:43 -------------------------------------------------------------------------------- Wound Assessment Details Patient Name: Date of Service: Ardis Hughs, [name] 06/20/2023 2:00 PM Medical Record Number: [id number] Patient Account Number: [id number] Date of Birth/Sex: Treating RN: [birth year] (72 y.o. [name] Primary Care  Mathilda Maguire: [name] NDI Other Clinician: Referring Jaegar Croft: Treating Nasrin Lanzo/Extender: Geralyn Corwin HA RRIS, BRA NDI Weeks in Treatment: 3 Wound Status Wound Number: 15 Primary Diabetic Wound/Ulcer of the Lower Extremity Etiology: Wound Location: Left, Proximal, Medial Amputation Site - Below Knee Wound Healed - Epithelialized Wounding Event: Blister Status: Date Acquired: 04/13/2023 Comorbid Cataracts, Lymphedema, Arrhythmia, Peripheral Venous Disease, Weeks Of Treatment: 3 History: Type II Diabetes, Osteoarthritis, Neuropathy Clustered Wound: No Wound Measurements Length: (cm) Width: (cm) Depth: (cm) Area: (cm) Volume: (cm) 0 % Reduction in Area: 100% 0 % Reduction in Volume: 100% 0 Tunneling: No 0 Undermining: No 0 Wound Description Classification: Grade 1 Exudate Amount: Medium Exudate Type: Serosanguineous Exudate Color: red, brown Periwound Skin Texture Texture Color No Abnormalities Noted: No No Abnormalities Noted: No Moisture Ziff, Dean L ([id number]) [id number] Page 7 of 9 No Abnormalities Noted: No Treatment Notes Wound #15 (Amputation Site - Below Knee) Wound Laterality: Left, Medial, Proximal Cleanser Peri-Wound Care Topical Primary Dressing Secondary Dressing Secured With Compression Wrap Compression Stockings Add-Ons Electronic Signature(s) Signed: 06/22/2023 4:28:57 PM By: Brenton Grills Entered By: Brenton Grills on 06/20/2023 14:34:51 -------------------------------------------------------------------------------- Wound Assessment Details Patient Name: Date of Service: Ardis Hughs, [name] 06/20/2023 2:00 PM Medical Record Number: [id number] Patient Account Number: [id number] Date of Birth/Sex: Treating RN: Feb 06, 1951 (72 y.o. [name] Primary Care Miliani Deike: [name] NDI Other Clinician: Referring Micala Saltsman: Treating Quantel Mcinturff/Extender: Geralyn Corwin HA RRIS, BRA NDI Weeks in Treatment:  3 Wound Status Wound Number: 16 Primary Lymphedema Etiology: Wound Location: Right Lower Leg Wound Open Wounding Event: Gradually Appeared Status: Date Acquired: 06/14/2023 Comorbid Cataracts, Lymphedema, Arrhythmia, Peripheral Venous Disease, Weeks Of Treatment: 0 History: Type II Diabetes, Osteoarthritis, Neuropathy Clustered Wound: No Photos Wound Measurements Length: (cm) 3 Width: (cm) 1.5 Depth: (cm) 0.1 Area: (cm)  3.534 Volume: (cm) 0.353 % Reduction in Area: % Reduction in Volume: Epithelialization: Small (1-33%) Tunneling: No Undermining: No Wound Description Classification: Full Thickness Without Exposed Support Wound Margin: Distinct, outline attached Behrman, Lancelot L ([id number]) Exudate Amount: Medium Exudate Type: Serosanguineous Exudate Color: red, brown Structures Foul Odor After Cleansing: No Slough/Fibrino No [id number] Page 8 of 9 Wound Bed Granulation Amount: Medium (34-66%) Exposed Structure Granulation Quality: Red, Pink Fat Layer (Subcutaneous Tissue) Exposed: Yes Necrotic Amount: Medium (34-66%) Necrotic Quality: Eschar, Adherent Slough Periwound Skin Texture Texture Color No Abnormalities Noted: No No Abnormalities Noted: No Scarring: Yes Hemosiderin Staining: Yes Moisture Temperature / Pain No Abnormalities Noted: No Temperature: No Abnormality Dry / Scaly: No Maceration: No Treatment Notes Wound #16 (Lower Leg) Wound Laterality: Right Cleanser Peri-Wound Care Topical Mupirocin Ointment Discharge Instruction: Apply Mupirocin (Bactroban) as instructed Primary Dressing Promogran Prisma Matrix, 4.34 (sq in) (silver collagen) Discharge Instruction: Moisten collagen with saline or hydrogel Secondary Dressing Secured With Compression Wrap ThreePress (3 layer compression wrap) Discharge Instruction: Apply three layer compression as directed. Compression Stockings Add-Ons Electronic Signature(s) Signed:  06/22/2023 4:28:57 PM By: Brenton Grills Entered By: Brenton Grills on 06/20/2023 14:23:14 -------------------------------------------------------------------------------- Vitals Details Patient Name: Date of Service: Ardis Hughs, [name] 06/20/2023 2:00 PM Medical Record Number: [id number] Patient Account Number: [id number] Date of Birth/Sex: Treating RN: [birth year] (72 y.o. [name] Primary Care Gerry Blanchfield: [name] NDI Other Clinician: Referring Adriannah Steinkamp: Treating Amario Longmore/Extender: Geralyn Corwin HA RRIS, BRA NDI Weeks in Treatment: 3 Vital Signs Time Taken: 13:57 Temperature (F): 98.6 Pulse (bpm): 80 Respiratory Rate (breaths/min): 18 Blood Pressure (mmHg): 125/72 Capillary Blood Glucose (mg/dl): 010 Reference Range: 80 - 120 mg / dl ERMIN, PARISIEN L ([id number]) [id number] Page 9 of 9 Electronic Signature(s) Signed: 06/22/2023 4:28:57 PM By: Brenton Grills Entered By: Brenton Grills on 06/20/2023 14:01:28

## 2023-06-28 ENCOUNTER — Ambulatory Visit (HOSPITAL_BASED_OUTPATIENT_CLINIC_OR_DEPARTMENT_OTHER): Payer: 59 | Admitting: Internal Medicine

## 2023-07-01 ENCOUNTER — Encounter (HOSPITAL_BASED_OUTPATIENT_CLINIC_OR_DEPARTMENT_OTHER): Payer: 59 | Admitting: Internal Medicine

## 2023-07-01 DIAGNOSIS — I87311 Chronic venous hypertension (idiopathic) with ulcer of right lower extremity: Secondary | ICD-10-CM

## 2023-07-01 DIAGNOSIS — I890 Lymphedema, not elsewhere classified: Secondary | ICD-10-CM | POA: Diagnosis not present

## 2023-07-01 DIAGNOSIS — E11622 Type 2 diabetes mellitus with other skin ulcer: Secondary | ICD-10-CM

## 2023-07-01 DIAGNOSIS — S81801A Unspecified open wound, right lower leg, initial encounter: Secondary | ICD-10-CM | POA: Diagnosis not present

## 2023-07-03 NOTE — Progress Notes (Signed)
Reese Reese ([id number]) [id number] Page 1 of 8 Visit Report for 07/01/2023 Arrival Information Details Patient Name: Date of Service: Reese Reese Reese Reese. 07/01/2023 11:15 A M Medical Record Number: [id number] Patient Account Number: [id number] Date of Birth/Sex: Treating RN: 1951-10-18 (72 y.o. Reese Reese Primary Care Reese Reese: Reese Reese Reese Other Clinician: Referring Reese Reese: Treating Reese Reese/Extender: Reese Reese Reese Reese, Reese Reese Weeks in Treatment: 5 Visit Information History Since Last Visit Added or deleted any medications: No Patient Arrived: Wheel Chair Any new allergies or adverse reactions: No Arrival Time: 11:15 Had a fall or experienced change in No Accompanied By: self activities of daily living that may affect Transfer Assistance: Manual risk of falls: Patient Identification Verified: Yes Signs or symptoms of abuse/neglect since last visito No Secondary Verification Process Completed: Yes Hospitalized since last visit: No Patient Requires Transmission-Based Precautions: No Implantable device outside of the clinic excluding No Patient Has Alerts: No cellular tissue based products placed in the center since last visit: Has Dressing in Place as Prescribed: Yes Pain Present Now: No Notes documenting late due to downtime Friday, unable to upload photos. Electronic Signature(s) Signed: 07/03/2023 1:54:09 PM By: Allen Stall RN, BSN Entered By: Reese Reese on 07/03/2023 11:16:05 -------------------------------------------------------------------------------- Compression Therapy Details Patient Name: Date of Service: Reese Reese, Reese Reese. 07/01/2023 11:15 A M Medical Record Number: [id number] Patient Account Number: [id number] Date of Birth/Sex: Treating RN: 10-06-1951 (72 y.o. Reese Reese Primary Care Reese Reese: Reese Reese Reese Other Clinician: Referring Reese Reese: Treating Reese Reese/Extender: Reese Reese, Reese Reese Weeks in Treatment: 5 Compression Therapy Performed for Wound Assessment: Wound #16 Right Lower Leg Performed By: Clinician Reese Ard, RN Compression Type: Double Layer Post Procedure Diagnosis Same as Pre-procedure Electronic Signature(s) Signed: 07/03/2023 1:54:09 PM By: Allen Stall RN, BSN Entered By: Reese Reese on 07/03/2023 11:18:54 Reese Reese ([id number]) [id number] Page 2 of 8 -------------------------------------------------------------------------------- Encounter Discharge Information Details Patient Name: Date of Service: Reese Reese, [place] Reese Reese. 07/01/2023 11:15 A M Medical Record Number: [id number] Patient Account Number: [id number] Date of Birth/Sex: Treating RN: 04-30-1951 (72 y.o. Reese Reese Primary Care Reese Reese: Reese Reese Reese Other Clinician: Referring Reese Reese: Treating Reese Reese/Extender: Reese Reese Reese Reese, Reese Reese Weeks in Treatment: 5 Encounter Discharge Information Items Discharge Condition: Stable Ambulatory Status: Wheelchair Discharge Destination: Home Transportation: Private Auto Accompanied By: self Schedule Follow-up Appointment: Yes Clinical Summary of Care: Electronic Signature(s) Signed: 07/03/2023 1:54:09 PM By: Allen Stall RN, BSN Entered By: Reese Reese on 07/03/2023 11:27:56 -------------------------------------------------------------------------------- Lower Extremity Assessment Details Patient Name: Date of Service: Reese Reese, Reese Reese. 07/01/2023 11:15 A M Medical Record Number: [id number] Patient Account Number: [id number] Date of Birth/Sex: Treating RN: 1951/09/09 (72 y.o. Reese Reese, Reese Reese Primary Care Reese Reese: Reese Reese Reese Other Clinician: Referring Reese Reese: Treating Reese Reese/Extender: Reese Reese Reese Reese, Reese Reese Weeks in Treatment: 5 Edema Assessment Assessed: [Left: No] [Right: Yes] Edema: [Left: Ye] [Right: s] Calf Left: Right: Point of  Measurement: 37 cm From Medial Instep 44.2 cm Ankle Left: Right: Point of Measurement: 11 cm From Medial Instep 23.4 cm Knee To Floor Left: Right: From Medial Instep 43 cm Vascular Assessment Pulses: Dorsalis Pedis Palpable: [Right:Yes] Extremity colors, hair growth, and conditions: Extremity Color: [Right:Hyperpigmented] Hair Growth on Extremity: [Right:No] Temperature of Extremity: [Right:Warm] Capillary Refill: [Right:< 3 seconds] Dependent Rubor: [Right:No] Reese Reese, Reese Reese ([id number]) [[id number] Page 3 of 8] Blanched when Elevated: [Right:No Yes] Toe Nail Assessment Left: Right:  Thick: Yes Discolored: Yes Deformed: Yes Improper Length and Hygiene: No Electronic Signature(s) Signed: 07/03/2023 1:54:09 PM By: Allen Stall RN, BSN Entered By: Reese Reese on 07/03/2023 11:17:25 -------------------------------------------------------------------------------- Multi Wound Chart Details Patient Name: Date of Service: Reese Reese, Reese Reese. 07/01/2023 11:15 A M Medical Record Number: [id number] Patient Account Number: [id number] Date of Birth/Sex: Treating RN: 1951-01-07 (72 y.o. M) Primary Care Reese Reese: Reese Reese Reese Other Clinician: Referring Reese Reese: Treating Reese Reese/Extender: Reese Reese Reese Reese, Reese Reese Weeks in Treatment: 5 Vital Signs Height(in): Pulse(bpm): 89 Weight(lbs): Blood Pressure(mmHg): 123/67 Body Mass Index(BMI): Temperature(F): 98.7 Respiratory Rate(breaths/min): 18 [16:Photos: No Photos Right Lower Leg Wound Location: Gradually Appeared Wounding Event: Lymphedema Primary Etiology: Cataracts, Lymphedema, Arrhythmia, N/A Comorbid History: Peripheral Venous Disease, Type II Diabetes, Osteoarthritis, Neuropathy 06/14/2023 Date  Acquired: 1 Weeks of Treatment: Open Wound Status: No Wound Recurrence: 1.4x1x0.1 Measurements Reese x W x D (cm) 1.1 A (cm) : rea 0.11 Volume (cm) : 68.90% % Reduction in Area: 68.80% %  Reduction in Volume: Full Thickness Without Exposed Classification:  Support Structures Medium Exudate Amount: Serosanguineous Exudate Type: red, brown Exudate Color: Distinct, outline attached Wound Margin: Large (67-100%) Granulation Amount: Red, Pink Granulation Quality: Small (1-33%) Necrotic Amount: Fat Layer  (Subcutaneous Tissue): Yes N/A Exposed Structures: Fascia: No Tendon: No Muscle: No Joint: No Bone: No Large (67-100%) Epithelialization: Scarring: Yes Periwound Skin Texture: Excoriation: No Induration: No Callus: No Crepitus: No Rash: No] [N/A:N/A N/A  N/A N/A N/A N/A N/A N/A N/A N/A N/A N/A N/A N/A N/A N/A N/A N/A N/A N/A N/A N/A N/A] Reese Reese, Reese Reese ([id number]) [16:Maceration: No Periwound Skin Moisture: Dry/Scaly: No Hemosiderin Staining: Yes Periwound Skin Color: Atrophie Blanche: No Cyanosis: No Ecchymosis: No Erythema: No Mottled: No Pallor: No Rubor: No No Abnormality Temperature: Compression Therapy  Procedures Performed:] [N/A:N/A N/A N/A N/A] Treatment Notes Wound #16 (Lower Leg) Wound Laterality: Right Cleanser Soap and Water Discharge Instruction: May shower and wash wound with dial antibacterial soap and water prior to dressing change. Wound Cleanser Discharge Instruction: Cleanse the wound with wound cleanser prior to applying a clean dressing using gauze sponges, not tissue or cotton balls. Peri-Wound Care Topical Gentamicin Discharge Instruction: mix with mupirocin. ****IN CLINIC ONLY*** Mupirocin Ointment Discharge Instruction: Apply Mupirocin (Bactroban) as instructed. MIX WITH GENTAMICIN. ****IN CLINIC ONLY.*** Primary Dressing Maxorb Extra Ag+ Alginate Dressing, 4x4.75 (in/in) Discharge Instruction: Apply to wound bed as instructed Secondary Dressing ABD Pad, 8x10 Discharge Instruction: Apply over primary dressing as directed. Secured With Compression Wrap ThreePress (3 layer compression wrap) Discharge Instruction: Apply three layer compression as  directed.****APPLY FIRST LAYER OF UNNA BOOT AT UPPER PORTION OF LOWER LEG TO SECURE WRAP IN PLACE.*** Compression Stockings Add-Ons Electronic Signature(s) Signed: 07/05/2023 4:26:53 PM By: Reese Corwin DO Entered By: Reese Reese on 07/04/2023 17:10:05 -------------------------------------------------------------------------------- Multi-Disciplinary Care Plan Details Patient Name: Date of Service: Reese Reese, Reese Reese. 07/01/2023 11:15 A M Medical Record Number: [id number] Patient Account Number: [id number] Date of Birth/Sex: Treating RN: January 06, 1951 (72 y.o. Reese Reese Primary Care Willem Klingensmith: Reese Reese Reese Other Clinician: Referring Maebry Obrien: Treating Myosha Cuadras/Extender: Reese Reese, Reese Reese Weeks in Treatment: 5 Active Inactive Reese Reese, Reese Reese ([id number]) [id number].pdf Page 5 of 8 Wound/Skin Impairment Nursing Diagnoses: Knowledge deficit related to smoking impact on wound healing Goals: Patient/caregiver will verbalize understanding of skin care regimen Date Initiated: 05/24/2023 Target Resolution Date: 07/13/2023 Goal Status: Active Interventions: Assess patient/caregiver ability to obtain necessary supplies Assess patient/caregiver ability to perform  ulcer/skin care regimen upon admission and as needed Assess ulceration(s) every visit Provide education on ulcer and skin care Screen for HBO Treatment Activities: Skin care regimen initiated : 05/24/2023 Topical wound management initiated : 05/24/2023 Notes: Electronic Signature(s) Signed: 07/03/2023 1:54:09 PM By: Allen Stall RN, BSN Entered By: Reese Reese on 07/03/2023 11:26:46 -------------------------------------------------------------------------------- Pain Assessment Details Patient Name: Date of Service: Reese Reese, Reese Reese. 07/01/2023 11:15 A M Medical Record Number: [id number] Patient Account Number: [id number] Date of Birth/Sex: Treating RN: May 29, 1951 (71  y.o. Reese Reese Primary Care Lesley Galentine: Reese Reese Reese Other Clinician: Referring Ben Sanz: Treating Marc Sivertsen/Extender: Reese Reese, Reese Reese Weeks in Treatment: 5 Active Problems Location of Pain Severity and Description of Pain Patient Has Paino No Site Locations Pain Management and Medication Current Pain Management: Electronic Signature(s) Signed: 07/03/2023 1:54:09 PM By: Allen Stall RN, BSN Entered By: Reese Reese on 07/03/2023 11:16:27 Reese Reese ([id number]) [id number] Page 6 of 8 -------------------------------------------------------------------------------- Patient/Caregiver Education Details Patient Name: Date of Service: Reese Reese 7/19/2024andnbsp11:15 A M Medical Record Number: [id number] Patient Account Number: [id number] Date of Birth/Gender: Treating RN: [birth year] (72 y.o. Reese Reese Primary Care Physician: Reese Reese Reese Other Clinician: Referring Physician: Treating Physician/Extender: Reese Reese, Reese Reese Weeks in Treatment: 5 Education Assessment Education Provided To: Patient Education Topics Provided Wound/Skin Impairment: Handouts: Caring for Your Ulcer Methods: Explain/Verbal Responses: Reinforcements needed Electronic Signature(s) Signed: 07/03/2023 1:54:09 PM By: Allen Stall RN, BSN Entered By: Reese Reese on 07/03/2023 11:26:58 -------------------------------------------------------------------------------- Wound Assessment Details Patient Name: Date of Service: Reese Reese, Reese Reese. 07/01/2023 11:15 A M Medical Record Number: [id number] Patient Account Number: [id number] Date of Birth/Sex: Treating RN: [birth year] (72 y.o. Reese Reese Primary Care Chessie Neuharth: Reese Reese Reese Other Clinician: Referring Caleigh Rabelo: Treating Ran Tullis/Extender: Reese Reese Reese Reese, Reese Reese Weeks in Treatment: 5 Wound Status Wound Number: 16 Primary  Lymphedema Etiology: Wound Location: Right Lower Leg Wound Open Wounding Event: Gradually Appeared Status: Date Acquired: 06/14/2023 Comorbid Cataracts, Lymphedema, Arrhythmia, Peripheral Venous Disease, Weeks Of Treatment: 1 History: Type II Diabetes, Osteoarthritis, Neuropathy Clustered Wound: No Wound Measurements Length: (cm) 1.4 Width: (cm) 1 Depth: (cm) 0.1 Area: (cm) 1.1 Volume: (cm) 0.11 % Reduction in Area: 68.9% % Reduction in Volume: 68.8% Epithelialization: Large (67-100%) Tunneling: No Undermining: No Wound Description Classification: Full Thickness Without Exposed Support Wound Margin: Distinct, outline attached Exudate Amount: Medium Exudate Type: Serosanguineous Exudate Color: red, brown Reese Reese, Reese Reese ([id number]) Wound Bed Granulation Amount: Large (67-10 Granulation Quality: Red, Pink Necrotic Amount: Small (1-33% Structures Foul Odor After Cleansing: No Slough/Fibrino No [phone].pdf Page 7 of 8 0%) Exposed Structure Fascia Exposed: No ) Fat Layer (Subcutaneous Tissue) Exposed: Yes Tendon Exposed: No Muscle Exposed: No Joint Exposed: No Bone Exposed: No Periwound Skin Texture Texture Color No Abnormalities Noted: No No Abnormalities Noted: No Callus: No Atrophie Blanche: No Crepitus: No Cyanosis: No Excoriation: No Ecchymosis: No Induration: No Erythema: No Rash: No Hemosiderin Staining: Yes Scarring: Yes Mottled: No Pallor: No Moisture Rubor: No No Abnormalities Noted: No Dry / Scaly: No Temperature / Pain Maceration: No Temperature: No Abnormality Treatment Notes Wound #16 (Lower Leg) Wound Laterality: Right Cleanser Soap and Water Discharge Instruction: May shower and wash wound with dial antibacterial soap and water prior to dressing change. Wound Cleanser Discharge Instruction: Cleanse the wound with wound cleanser prior to applying a clean dressing using gauze sponges, not tissue or cotton  balls. Peri-Wound  Care Topical Gentamicin Discharge Instruction: mix with mupirocin. ****IN CLINIC ONLY*** Mupirocin Ointment Discharge Instruction: Apply Mupirocin (Bactroban) as instructed. MIX WITH GENTAMICIN. ****IN CLINIC ONLY.*** Primary Dressing Maxorb Extra Ag+ Alginate Dressing, 4x4.75 (in/in) Discharge Instruction: Apply to wound bed as instructed Secondary Dressing ABD Pad, 8x10 Discharge Instruction: Apply over primary dressing as directed. Secured With Compression Wrap ThreePress (3 layer compression wrap) Discharge Instruction: Apply three layer compression as directed.****APPLY FIRST LAYER OF UNNA BOOT AT UPPER PORTION OF LOWER LEG TO SECURE WRAP IN PLACE.*** Compression Stockings Add-Ons Electronic Signature(s) Signed: 07/03/2023 1:54:09 PM By: Allen Stall RN, BSN Entered By: Reese Reese on 07/03/2023 11:18:09 Vitals Details -------------------------------------------------------------------------------- Reese Reese ([id number]) [id number] Page 8 of 8 Patient Name: Date of Service: Reese Reese, Reese Reese. 07/01/2023 11:15 A M Medical Record Number: [id number] Patient Account Number: [id number] Date of Birth/Sex: Treating RN: 07-18-1951 (72 y.o. Reese Reese, Reese Reese Primary Care Magdaleno Lortie: Reese Reese Reese Other Clinician: Referring Tristine Langi: Treating Niles Ess/Extender: Reese Reese Reese Reese, Reese Reese Weeks in Treatment: 5 Vital Signs Time Taken: 11:15 Temperature (F): 98.7 Pulse (bpm): 89 Respiratory Rate (breaths/min): 18 Blood Pressure (mmHg): 123/67 Reference Range: 80 - 120 mg / dl Electronic Signature(s) Signed: 07/03/2023 1:54:09 PM By: Allen Stall RN, BSN Entered By: Reese Reese on 07/03/2023 11:16:19

## 2023-07-05 NOTE — Progress Notes (Addendum)
RUSS, LOOPER ([id number]) [id number] Page 1 of 10 Visit Report for 07/01/2023 Chief Complaint Document Details Patient Name: Date of Service: [name], [name] 07/01/2023 11:15 A M Medical Record Number: [id number] Patient Account Number: [id number] Date of Birth/Sex: Treating RN: [birth year] (72 y.o. M) Primary Care Provider: [name] NDI Other Clinician: Referring Provider: Treating Provider/Extender: Cannon Kettle, BRA NDI Weeks in Treatment: 5 Information Obtained from: Patient Chief Complaint 02/03/2022: The patient is here today for reevaluation of bilateral lower extremity edema and venous stasis dermatitis Electronic Signature(s) Signed: 07/05/2023 4:26:53 PM By: Geralyn Corwin DO Entered By: Geralyn Corwin on 07/04/2023 17:10:15 -------------------------------------------------------------------------------- HPI Details Patient Name: Date of Service: [name], [name] 07/01/2023 11:15 A M Medical Record Number: [id number] Patient Account Number: [id number] Date of Birth/Sex: Treating RN: 18-Sep-1951 (72 y.o. M) Primary Care Provider: [name] NDI Other Clinician: Referring Provider: Treating Provider/Extender: Geralyn Corwin HA RRIS, BRA NDI Weeks in Treatment: 5 History of Present Illness HPI Description: [name] is a 72 year old male with a past medical history of type 2 diabetes and essential hypertension who presents to the clinic today for bilateral lower extremity wounds. He was recently seen in the ED on 3/9 for bilateral lower extremity edema. He had a DVT study that was negative for clots. He was given triamcinolone cream and advised to elevate legs and obtain compression stockings. He has been using the triamcinolone cream however he is unable to use compression stockings due to difficulty of putting on. Patient states that for the past 2 years he has had opening and closing of weeping wounds to his legs  bilaterally. They spontaneously open and close. They are often open for several weeks before they heal spontaneously. He denies any purulent drainage, increased warmth or increased erythema to the skin. 4/21; patient presents for 1 week follow-up. The wounds on the right leg have healed however he has now 2 new wounds 1 on the anterior and posterior thigh that occurred from him scratching underneath the wrap. The left leg wound has improved and almost closed 4/28; patient presents for 1 week follow-up. He continues to have a wound on the right anterior leg that is showing signs of healing. The posterior right thigh has closed. He now has a new wound to the left leg that is limited to skin breakdown. 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 The wounds are occurring because he goes underneath the leg wraps and scratches creating new wounds. He has no complaints today. 5/5; patient presents for 1 week follow-up. He no longer has any open wounds to his lower extremities bilaterally. He continues to have scattered excoriation marks from where he scratches. These are scabbed over. He states he is going to see dermatology this month. Overall he is doing well and is happy with his care. Readmission 6/10 Patient was followed for open wounds limited to skin breakdown to his lower extremities bilaterally 2/2 venous insufficiency. Once the wounds healed he was discharged with juxta lite compressions and these were placed in office. He was discharged on 5/5. He states that he has not taken the wraps off since discharge and has had them on for over a month. He did not want to take them off because they were very comfortable. He does not have any difficulty putting these on and off if he wanted to. He states that he does not take  a shower and just cleans the other parts of his body at the sink. He noticed that 2 days ago he was having some  drainage at the top of the wrap and called in to be evaluated. He states that he has not seen the wounds until the wraps were AMARO, MANGOLD ([id number]) [id number] Page 2 of 10 taken off today in office. He denies pain or increased warmth or erythema to the area 6/27; patient presents for 2-week follow-up. He has had increased swelling and drainage to the wounds over the last week. He reports increased erythema to the area. He does not have pain however. He denies purulent drainage or fever/chills. 7/5; patient presents for 1 week follow-up. He was prescribed Keflex at last clinic visit and is currently taking this. He reports significant improvement to his leg swelling, tenderness and erythema. He denies signs of infection. 7/21; patient presents for 2-week follow-up. He reports improvement in healing in his right lower extremity. Unfortunately this morning he noticed skin breakdown to his left lower extremity with increased redness and warmth. 7/28; patient presents for 1 week follow-up. He reports completing his antibiotics. He states that his wounds are healed. He has been using his juxta light compressions. He has no issues or complaints today. Readmission 12/17/2021; This is a 72 year old man who has been seen twice by Dr. Mikey Bussing for wounds on his bilateral lower extremities secondary to chronic venous insufficiency. Most recently this was from 05/22/2021 through 07/09/2021 with bilateral lower extremity wounds largely secondary to chronic venous insufficiency with stasis dermatitis. He has been discharged with juxta lite stockings. The patient tells me that about a month ago he developed too much edema to get his juxta lites on. He developed increased swelling. He saw his primary physician who gave him Lasix and the swelling seems to have come down. 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 He says this dates  back into his adolescence and he could not walk barefoot etc. As a result of this he sometimes leaves his shoes and socks on for weeks and he is done that recently.[name] He arrives in clinic with not any wounds on his legs however with close inspection he had a fairly deep wound in the left second and third toe webspace His skin on his feet was not in good shape macerated flaking malodorous The patient's past medical history has not changed. I note that he had arterial studies done on 06/03/2021 that showed an ABI on the right of 1.26 with triphasic waveforms TBI of 0.63 on the right on the left ABI of 0.85 TBI of 0.63 again with triphasic waveforms 1/19; since the patient was last here he was seen in the hospital for bradycardia. He had his medications adjusted digoxin metoprolol. He tells me they dressed the wound between the left second and third toe. The patient says that he had open wounds on his dorsal foot when we took off the dressing today. I was not exactly sure what he had on their. He had 2 wounds on the left dorsal foot with some significant surrounding erythema.The wound between the left second and third toe was just about closed however when we opened it to examine it today it it opened and a small area. The skin on his lower legs especially the left looks a lot better using triamcinolone and Cetaphil which she is applying He was on cephalexin prior described I think by primary care before  he came here he completed this about 3 or 4 days ago 1/26; the area between the left second and third toes is healed. He still has 2 small areas on the left dorsal foot. The erythema that I marked around this last week seems better. He is completing his doxycycline. I also gave him triamcinolone and Cetaphil last week for the dry flaking irritated inflamed skin on his dorsal feet and lower legs this all seems better. He has been wearing his juxta lite stockings 2/2; everything is healed here. He is now  treating his skin with I think both the triamcinolone and Cetaphil mixture that I prescribed and probably pure triamcinolone that the dermatologist prescribed. I told him to use my compounded steroid cream as a moisture and anti-itch and inflammation cream when he is finished without to go to a pure moisturizer I emphasized that steroids under compression even if its a juxta lite stocking can get absorbed and secondarily can cause skin atrophy. I asked him not to use both. On an optimistic front he seems a lot more interested in treating his skin and watching for areas on his feet that are breaking down. His skin looks a lot better he has a juxta lite stocking 2/14; patient we discharged 2 weeks ago. He has noted increasing edema in the right greater than left leg erythema and intense pruritus. Arrives in clinic really with no open wound however considerable amount of marks on his upper right leg from scratching. He claims compliance with his juxta lites up with up to a week ago on the right. He has been wearing 1 on the left. He has home health nurse put kerlix on this 02/03/2022: No open wounds. He has been in 3 layer compression, which is slipped down and caused some "mushrooming" affect from just below the tibial tuberosity to the knee. The excoriations on his legs have resolved. He reports being compliant with moisturizing and elevation of his legs. He saw his cardiologist yesterday and was prescribed 20 mg of Lasix to be used on an as-needed basis. Readmission 06/01/2022 Mr. Alejo Beamer is a 72 year old male with a past medical history of venous insufficiency, lymphedema, type 2 diabetes that presents to the clinic for a 1 month history of worsening leg wounds. He states he has not been wearing his compression wraps for the past 2 months. He states his Lasix was stopped by his primary doctor and then his legs became more swollen and he could not wear his compression stockings. He states  eventually he was put back on Lasix. He still has been unable to wear his compression stockings and although his swelling has improved slightly his wounds have not improved. He has increased warmth and erythema to the right lower extremity. 6/29; patient presents for follow-up. He completed his course of Keflex without issues. We have been using silver alginate with antibiotic ointment under compression therapy. He has no issues or complaints today. He has his juxta lite compressions with him today. 7/7; patient presents for follow-up. We have been using silver alginate under 3 layer compression. Patient has done well with this. His wounds have healed. He states he is going to get custom garment compression wraps. For now he has his juxta lite compressions. 05/24/2023 Mr. 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 He states that  he had a shrinker on and when taken off it peeled back the skin. He has been healing however has 2 open areas still present. He has been keeping the area covered. He also reports open wounds to the right lower extremity but these have healed with Ace wraps. He does have juxta lite compression Velcro wraps at home. He has home health that changes his dressings. He currently denies signs of infection. 6/18; patient presents for follow-up. We have been using Hydrofera Blue to the wound beds under Tubigrip. Wounds are smaller. 7/8; patient presents for follow-up. We have been using Hydrofera Blue to the wound beds on the left stump under Tubigrip. His wounds of healed. He did have some areas of irritation where the tape pulled. He has developed a new wound to the right lower extremity. He has been sleeping in his chair. He developed more swelling and how has a new wound. 7/19; patient  presents for follow-up. We have been using collagen under 3 layer compression to the right lower extremity. He has had some irritation from the wrap and now has a new wound to the posterior right leg. Electronic Signature(s) Signed: 07/05/2023 4:26:53 PM By: Geralyn Corwin DO Entered By: Geralyn Corwin on 07/04/2023 17:12:30 [name] ([id number]) [id number] Page 3 of 10 -------------------------------------------------------------------------------- Physical Exam Details Patient Name: Date of Service: [name], [name] 07/01/2023 11:15 A M Medical Record Number: [id number] Patient Account Number: [id number] Date of Birth/Sex: Treating RN: 04-30-1951 (72 y.o. M) Primary Care Provider: [name] NDI Other Clinician: Referring Provider: Treating Provider/Extender: Geralyn Corwin HA RRIS, BRA NDI Weeks in Treatment: 5 Constitutional respirations regular, non-labored and within target range for patient.. Cardiovascular 2+ dorsalis pedis/posterior tibialis pulses. Psychiatric pleasant and cooperative. Notes Right lower extremity: Venous stasis dermatitis. T the anterior aspect there is an open wound with granulation tissue throughout. New wound to the posterior o aspect with granulation tissue. 2+ pitting edema. No signs of active surrounding soft tissue infection. Electronic Signature(s) Signed: 07/05/2023 4:26:53 PM By: Geralyn Corwin DO Entered By: Geralyn Corwin on 07/04/2023 17:13:51 -------------------------------------------------------------------------------- Physician Orders Details Patient Name: Date of Service: [name], [name] 07/01/2023 11:15 A M Medical Record Number: [id number] Patient Account Number: [id number] Date of Birth/Sex: Treating RN: September 14, 1951 (72 y.o. [name] Primary Care Provider: [name] NDI Other Clinician: Referring Provider: Treating Provider/Extender: Cannon Kettle, BRA  NDI Weeks in Treatment: 5 Verbal / Phone Orders: No Diagnosis Coding ICD-10 Coding Code Description [phone] Non-pressure chronic ulcer of other part of left lower leg with fat layer exposed S81.801A Unspecified open wound, right lower leg, initial encounter E11.622 Type 2 diabetes mellitus with other skin ulcer Z89.512 Acquired absence of left leg below knee I87.323 Chronic venous hypertension (idiopathic) with inflammation of bilateral lower extremity I89.0 Lymphedema, not elsewhere classified Follow-up Appointments ppointment in 1 week. - Dr. Mikey Bussing Return A Anesthetic (In clinic) Topical Lidocaine 4% applied to wound bed Bathing/ Shower/ Hygiene May shower with protection but do not get wound dressing(s) wet. Protect dressing(s) with water repellant cover (for example, large plastic bag) or a cast cover and may then take shower. PHENIX, VANDERMEULEN ([id number]) [id number] Page 4 of 10 Edema Control - Lymphedema / SCD / Other Bilateral Lower Extremities Lymphedema Pumps. Use Lymphedema pumps on leg(s) 2-3 times a day for 45-60 minutes. If wearing any wraps or hose, do not remove them. Continue exercising as instructed. - wound center to order lymphedema pumps through  [facility]. Compression stocking or Garment 20-30 mm/Hg pressure to: - has juxtalite HD for right leg. DO not wear until healed. Other Edema Control Orders/Instructions: - tubigrip to size E to left stump. Home Health No change in wound care orders this week; continue Home Health for wound care. May utilize formulary equivalent dressing for wound treatment orders unless otherwise specified. - x1 a week by home health and wound center weekly. Other Home Health Orders/Instructions: [facility] Wound Treatment Wound #16 - Lower Leg Wound Laterality: Right Cleanser: Soap and Water 2 x Per Week/30 Days Discharge Instructions: May shower and wash wound with dial antibacterial soap and  water prior to dressing change. Cleanser: Wound Cleanser 2 x Per Week/30 Days Discharge Instructions: Cleanse the wound with wound cleanser prior to applying a clean dressing using gauze sponges, not tissue or cotton balls. Topical: Gentamicin 2 x Per Week/30 Days Discharge Instructions: mix with mupirocin. ****IN CLINIC ONLY*** Topical: Mupirocin Ointment 2 x Per Week/30 Days Discharge Instructions: Apply Mupirocin (Bactroban) as instructed. MIX WITH GENTAMICIN. ****IN CLINIC ONLY.*** Prim Dressing: Maxorb Extra Ag+ Alginate Dressing, 4x4.75 (in/in) 2 x Per Week/30 Days ary Discharge Instructions: Apply to wound bed as instructed Secondary Dressing: ABD Pad, 8x10 2 x Per Week/30 Days Discharge Instructions: Apply over primary dressing as directed. Compression Wrap: ThreePress (3 layer compression wrap) 2 x Per Week/30 Days Discharge Instructions: Apply three layer compression as directed.****APPLY FIRST LAYER OF UNNA BOOT AT UPPER PORTION OF LOWER LEG TO SECURE WRAP IN PLACE.*** Electronic Signature(s) Signed: 07/05/2023 4:26:53 PM By: Geralyn Corwin DO Previous Signature: 07/03/2023 1:54:09 PM Version By: Shawn Stall RN, BSN Entered By: Geralyn Corwin on 07/04/2023 17:14:01 -------------------------------------------------------------------------------- Problem List Details Patient Name: Date of Service: [name], [name] 07/01/2023 11:15 A M Medical Record Number: [id number] Patient Account Number: [id number] Date of Birth/Sex: Treating RN: [birth year] (72 y.o. [name] Primary Care Provider: [name] NDI Other Clinician: Referring Provider: Treating Provider/Extender: Galen Manila NDI Weeks in Treatment: 5 Active Problems ICD-10 Encounter Code Description Active Date MDM Diagnosis S81.801A Unspecified open wound, right lower leg, initial encounter 06/20/2023 No Yes I87.311 Chronic venous hypertension (idiopathic) with ulcer of right lower  extremity 07/01/2023 No Yes I89.0 Lymphedema, not elsewhere classified 05/24/2023 No Yes Labell, Wister L ([id number]) [id number] Page 5 of 10 E11.622 Type 2 diabetes mellitus with other skin ulcer 05/24/2023 No Yes Z89.512 Acquired absence of left leg below knee 05/24/2023 No Yes Inactive Problems Resolved Problems ICD-10 Code Description Active Date Resolved Date L97.822 Non-pressure chronic ulcer of other part of left lower leg with fat layer exposed 05/24/2023 05/24/2023 Electronic Signature(s) Signed: 07/05/2023 4:26:53 PM By: Geralyn Corwin DO Previous Signature: 07/03/2023 1:54:09 PM Version By: Shawn Stall RN, BSN Entered By: Geralyn Corwin on 07/04/2023 17:19:45 -------------------------------------------------------------------------------- Progress Note Details Patient Name: Date of Service: [name], [name] 07/01/2023 11:15 A M Medical Record Number: [id number] Patient Account Number: [id number] Date of Birth/Sex: Treating RN: October 05, 1951 (72 y.o. M) Primary Care Provider: [name] NDI Other Clinician: Referring Provider: Treating Provider/Extender: Cannon Kettle, BRA NDI Weeks in Treatment: 5 Subjective Chief Complaint Information obtained from Patient 02/03/2022: The patient is here today for reevaluation of bilateral lower extremity edema and venous stasis dermatitis History of Present Illness (HPI) Tate Zagal is a 72 year old male with a past medical history of type 2 diabetes and essential hypertension who presents to the clinic today for bilateral lower extremity wounds. He  was recently seen in the ED on 3/9 for bilateral lower extremity edema. He had a DVT study that was negative for clots. He was given triamcinolone cream and advised to elevate legs and obtain compression stockings. He has been using the triamcinolone cream however he is unable to use compression stockings due to difficulty of putting on. Patient  states that for the past 2 years he has had opening and closing of weeping wounds to his legs bilaterally. They spontaneously open and close. They are often open for several weeks before they heal spontaneously. He denies any purulent drainage, increased warmth or increased erythema to the skin. 4/21; patient presents for 1 week follow-up. The wounds on the right leg have healed however he has now 2 new wounds 1 on the anterior and posterior thigh that occurred from him scratching underneath the wrap. The left leg wound has improved and almost closed 4/28; patient presents for 1 week follow-up. He continues to have a wound on the right anterior leg that is showing signs of healing. The posterior right thigh has closed. He now has a new wound to the left leg that is limited to skin breakdown. 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 The wounds are occurring because he goes underneath the leg wraps and scratches creating new wounds. He has no complaints today. 5/5; patient presents for 1 week follow-up. He no longer has any open wounds to his lower extremities bilaterally. He continues to have scattered excoriation marks from where he scratches. These are scabbed over. He states he is going to see dermatology this month. Overall he is doing well and is happy with his care. Readmission 6/10 Patient was followed for open wounds limited to skin breakdown to his lower extremities bilaterally 2/2 venous insufficiency. Once the wounds healed he was discharged with juxta lite compressions and these were placed in office. He was discharged on 5/5. He states that he has not taken the wraps off since discharge and has had them on for over a month. He did not want to take them off because they were very comfortable. He does not have any difficulty putting these on and off if he wanted to. He states that he does not take a shower and just  cleans the other parts of his body at the sink. He noticed that 2 days ago he was having some drainage at the top of the wrap and called in to be evaluated. He states that he has not seen the wounds until the wraps were taken off today in office. He denies pain or increased warmth or erythema to the area TYRON, MANETTA ([id number]) [id number] Page 6 of 10 6/27; patient presents for 2-week follow-up. He has had increased swelling and drainage to the wounds over the last week. He reports increased erythema to the area. He does not have pain however. He denies purulent drainage or fever/chills. 7/5; patient presents for 1 week follow-up. He was prescribed Keflex at last clinic visit and is currently taking this. He reports significant improvement to his leg swelling, tenderness and erythema. He denies signs of infection. 7/21; patient presents for 2-week follow-up. He reports improvement in healing in his right lower extremity. Unfortunately this morning he noticed skin breakdown to his left lower extremity with increased redness and warmth. 7/28; patient presents for 1 week follow-up. He reports completing his antibiotics. He states that his wounds are healed. He has been  using his juxta light compressions. He has no issues or complaints today. Readmission 12/17/2021; This is a 72 year old man who has been seen twice by Dr. Mikey Bussing for wounds on his bilateral lower extremities secondary to chronic venous insufficiency. Most recently this was from 05/22/2021 through 07/09/2021 with bilateral lower extremity wounds largely secondary to chronic venous insufficiency with stasis dermatitis. He has been discharged with juxta lite stockings. The patient tells me that about a month ago he developed too much edema to get his juxta lites on. He developed increased swelling. He saw his primary physician who gave him Lasix and the swelling seems to have come down. He has a long history  of complaining of some form of dysesthetic plantar foot discomfort set. He says this dates back into his adolescence and he could not walk barefoot etc. As a result of this he sometimes leaves his shoes and socks on for weeks and he is done that recently.[name] He arrives in clinic with not any wounds on his legs however with close inspection he had a fairly deep wound in the left second and third toe webspace His skin on his feet was not in good shape macerated flaking malodorous The patient's past medical history has not changed. I note that he had arterial studies done on 06/03/2021 that showed an ABI on the right of 1.26 with triphasic waveforms TBI of 0.63 on the right on the left ABI of 0.85 TBI of 0.63 again with triphasic waveforms 1/19; since the patient was last here he was seen in the hospital for bradycardia. He had his medications adjusted digoxin metoprolol. He tells me they dressed the wound between the left second and third toe. The patient says that he had open wounds on his dorsal foot when we took off the dressing today. I was not exactly sure what he had on their. He had 2 wounds on the left dorsal foot with some significant surrounding erythema.The wound between the left second and third toe was just about closed however when we opened it to examine it today it it opened and a small area. The skin on his lower legs especially the left looks a lot better using triamcinolone and Cetaphil which she is applying He was on cephalexin prior described I think by primary care before he came here he completed this about 3 or 4 days ago 1/26; the area between the left second and third toes is healed. He still has 2 small areas on the left dorsal foot. The erythema that I marked around this last week seems better. He is completing his doxycycline. I also gave him triamcinolone and Cetaphil last week for the dry flaking irritated inflamed skin on his dorsal feet and lower legs this all seems better.  He has been wearing his juxta lite stockings 2/2; everything is healed here. He is now treating his skin with I think both the triamcinolone and Cetaphil mixture that I prescribed and probably pure triamcinolone that the dermatologist prescribed. I told him to use my compounded steroid cream as a moisture and anti-itch and inflammation cream when he is finished without to go to a pure moisturizer I emphasized that steroids under compression even if its a juxta lite stocking can get absorbed and secondarily can cause skin atrophy. I asked him not to use both. On an optimistic front he seems a lot more interested in treating his skin and watching for areas on his feet that are breaking down. His skin looks a lot better  he has a juxta lite stocking 2/14; patient we discharged 2 weeks ago. He has noted increasing edema in the right greater than left leg erythema and intense pruritus. Arrives in clinic really with no open wound however considerable amount of marks on his upper right leg from scratching. He claims compliance with his juxta lites up with up to a week ago on the right. He has been wearing 1 on the left. He has home health nurse put kerlix on this 02/03/2022: No open wounds. He has been in 3 layer compression, which is slipped down and caused some "mushrooming" affect from just below the tibial tuberosity to the knee. The excoriations on his legs have resolved. He reports being compliant with moisturizing and elevation of his legs. He saw his cardiologist yesterday and was prescribed 20 mg of Lasix to be used on an as-needed basis. Readmission 06/01/2022 Mr. Allie Gerhold is a 72 year old male with a past medical history of venous insufficiency, lymphedema, type 2 diabetes that presents to the clinic for a 1 month history of worsening leg wounds. He states he has not been wearing his compression wraps for the past 2 months. He states his Lasix was stopped by his primary doctor and then his legs  became more swollen and he could not wear his compression stockings. He states eventually he was put back on Lasix. He still has been unable to wear his compression stockings and although his swelling has improved slightly his wounds have not improved. He has increased warmth and erythema to the right lower extremity. 6/29; patient presents for follow-up. He completed his course of Keflex without issues. We have been using silver alginate with antibiotic ointment under compression therapy. He has no issues or complaints today. He has his juxta lite compressions with him today. 7/7; patient presents for follow-up. We have been using silver alginate under 3 layer compression. Patient has done well with this. His wounds have healed. He states he is going to get custom garment compression wraps. For now he has his juxta lite compressions. 05/24/2023 Mr. 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 He states that he had a shrinker on and when taken off it peeled back the skin. He has been healing however has 2 open areas still present. He has been keeping the area covered. He also reports open wounds to the right lower extremity but these have healed with Ace wraps. He does have juxta lite compression Velcro wraps at home. He has home health that changes his dressings. He currently denies signs of infection. 6/18; patient presents for follow-up. We have been using Hydrofera Blue to the wound beds under Tubigrip. Wounds are smaller. 7/8; patient presents for follow-up. We have been using Hydrofera Blue to the wound beds on the left stump under Tubigrip. His wounds of healed. He did have some areas of irritation where the tape pulled. He has developed a new wound to the right lower extremity. He has been sleeping in his  chair. He developed more swelling and how has a new wound. 7/19; patient presents for follow-up. We have been using collagen under 3 layer compression to the right lower extremity. He has had some irritation from the wrap and now has a new wound to the posterior right leg. Patient History Unable to Obtain Patient History due to Altered  Mental Status. Information obtained from Patient. Family History Cancer - Mother,Maternal Grandparents, Heart Disease - Maternal Grandparents, Hypertension - [address] VIPUL, CAFARELLI ([id number]) [id number] Page 7 of 10 No family history of Diabetes, Hereditary Spherocytosis. Social History Former smoker - ended on 09/14/1983, Marital Status - Single, Alcohol Use - Rarely, Drug Use - No History, Caffeine Use - Never. Medical History Eyes Patient has history of Cataracts - 2014 Hematologic/Lymphatic Patient has history of Lymphedema Cardiovascular Patient has history of Arrhythmia - A-Fib, Peripheral Venous Disease Endocrine Patient has history of Type II Diabetes Musculoskeletal Patient has history of Osteoarthritis Neurologic Patient has history of Neuropathy Denies history of Quadriplegia, Paraplegia, Seizure Disorder Oncologic Denies history of Received Chemotherapy, Received Radiation Psychiatric Denies history of Anorexia/bulimia, Confinement Anxiety Hospitalization/Surgery History - colon resection. - appendectomy. - multiple tooth extraction. Medical A Surgical History Notes nd Hematologic/Lymphatic Hyperbilirubinemia Gastrointestinal Rectal Pain-Constipation Genitourinary CKD stage 3-BPH Oncologic Colon Cancer-Colon Re-section Objective Constitutional respirations regular, non-labored and within target range for patient.. Vitals Time Taken: 11:15 AM, Temperature: 98.7 F, Pulse: 89 bpm, Respiratory Rate: 18 breaths/min, Blood Pressure: 123/67 mmHg. Cardiovascular 2+ dorsalis pedis/posterior  tibialis pulses. Psychiatric pleasant and cooperative. General Notes: Right lower extremity: Venous stasis dermatitis. T the anterior aspect there is an open wound with granulation tissue throughout. New wound to o the posterior aspect with granulation tissue. 2+ pitting edema. No signs of active surrounding soft tissue infection. Integumentary (Hair, Skin) Wound #16 status is Open. Original cause of wound was Gradually Appeared. The date acquired was: 06/14/2023. The wound has been in treatment 1 weeks. The wound is located on the Right Lower Leg. The wound measures 1.4cm length x 1cm width x 0.1cm depth; 1.1cm^2 area and 0.11cm^3 volume. There is Fat Layer (Subcutaneous Tissue) exposed. There is no tunneling or undermining noted. There is a medium amount of serosanguineous drainage noted. The wound margin is distinct with the outline attached to the wound base. There is large (67-100%) red, pink granulation within the wound bed. There is a small (1-33%) amount of necrotic tissue within the wound bed. The periwound skin appearance exhibited: Scarring, Hemosiderin Staining. The periwound skin appearance did not exhibit: Callus, Crepitus, Excoriation, Induration, Rash, Dry/Scaly, Maceration, Atrophie Blanche, Cyanosis, Ecchymosis, Mottled, Pallor, Rubor, Erythema. Periwound temperature was noted as No Abnormality. Assessment Active Problems ICD-10 Unspecified open wound, right lower leg, initial encounter Chronic venous hypertension (idiopathic) with ulcer of right lower extremity Lymphedema, not elsewhere classified Type 2 diabetes mellitus with other skin ulcer Acquired absence of left leg below knee WILDER, KUROWSKI L ([id number]) [id number] Page 8 of 10 Patient's right lower extremity wound appears well-healing. Unfortunately he has developed another wound to the posterior aspect due to irritation from the compression wrap. I recommended switching the dressing to  silver alginate and doing 3 layer compression. Prior to obtaining these wounds he was using his juxta lite compression garment daily. At this time he would benefit from lymphedema pumps. He has lymphedema secondary to chronic venous insufficiency. He has stage II lymphedema with pitting edema. Despite conservative treatment including elevation and compression garments and in office compression wraps spanning over 4 weeks he still has symptoms that persist. And still has stage II lymphedema. We will try and obtain lymphedema pumps for him. Procedures Wound #16 Pre-procedure diagnosis of Wound #16 is a Lymphedema located on the Right Lower Leg . There was a Double Layer Compression Therapy Procedure by Tommie Ard, RN. Post procedure Diagnosis Wound #16: Same as Pre-Procedure Plan Follow-up Appointments:  Return Appointment in 1 week. - Dr. Mikey Bussing Anesthetic: (In clinic) Topical Lidocaine 4% applied to wound bed Bathing/ Shower/ Hygiene: May shower with protection but do not get wound dressing(s) wet. Protect dressing(s) with water repellant cover (for example, large plastic bag) or a cast cover and may then take shower. Edema Control - Lymphedema / SCD / Other: Lymphedema Pumps. Use Lymphedema pumps on leg(s) 2-3 times a day for 45-60 minutes. If wearing any wraps or hose, do not remove them. Continue exercising as instructed. - wound center to order lymphedema pumps through [facility]. Compression stocking or Garment 20-30 mm/Hg pressure to: - has juxtalite HD for right leg. DO not wear until healed. Other Edema Control Orders/Instructions: - tubigrip to size E to left stump. Home Health: No change in wound care orders this week; continue Home Health for wound care. May utilize formulary equivalent dressing for wound treatment orders unless otherwise specified. - x1 a week by home health and wound center weekly. Other Home Health Orders/Instructions: [facility] WOUND #16: -  Lower Leg Wound Laterality: Right Cleanser: Soap and Water 2 x Per Week/30 Days Discharge Instructions: May shower and wash wound with dial antibacterial soap and water prior to dressing change. Cleanser: Wound Cleanser 2 x Per Week/30 Days Discharge Instructions: Cleanse the wound with wound cleanser prior to applying a clean dressing using gauze sponges, not tissue or cotton balls. Topical: Gentamicin 2 x Per Week/30 Days Discharge Instructions: mix with mupirocin. ****IN CLINIC ONLY*** Topical: Mupirocin Ointment 2 x Per Week/30 Days Discharge Instructions: Apply Mupirocin (Bactroban) as instructed. MIX WITH GENTAMICIN. ****IN CLINIC ONLY.*** Prim Dressing: Maxorb Extra Ag+ Alginate Dressing, 4x4.75 (in/in) 2 x Per Week/30 Days ary Discharge Instructions: Apply to wound bed as instructed Secondary Dressing: ABD Pad, 8x10 2 x Per Week/30 Days Discharge Instructions: Apply over primary dressing as directed. Com pression Wrap: ThreePress (3 layer compression wrap) 2 x Per Week/30 Days Discharge Instructions: Apply three layer compression as directed.****APPLY FIRST LAYER OF UNNA BOOT AT UPPER PORTION OF LOWER LEG TO SECURE WRAP IN PLACE.*** 1. Silver alginate under 3 layer compressionright lower extremity 2. Order lymphedema pumps 3. Follow-up in 1 week Electronic Signature(s) Signed: 07/07/2023 9:50:30 AM By: Geralyn Corwin DO Signed: 07/08/2023 4:29:32 PM By: Shawn Stall RN, BSN Previous Signature: 07/05/2023 4:26:53 PM Version By: Geralyn Corwin DO Entered By: Shawn Stall on 07/07/2023 07:47:54 -------------------------------------------------------------------------------- HxROS Details Patient Name: Date of Service: [name], [name] 07/01/2023 11:15 A M Medical Record Number: [id number] Patient Account Number: [id number] Date of Birth/Sex: Treating RN: 02-21-1951 (72 y.o. M) Primary Care Provider: [name] NDI Other Clinician: Referring Provider: Treating  Provider/Extender: Galen Manila NDI BUNYAN, BRIER ([id number]) [id number] Page 9 of 10 Weeks in Treatment: 5 Unable to Obtain Patient History due to Altered Mental Status Information Obtained From Patient Eyes Medical History: Positive for: Cataracts - 2014 Hematologic/Lymphatic Medical History: Positive for: Lymphedema Past Medical History Notes: Hyperbilirubinemia Cardiovascular Medical History: Positive for: Arrhythmia - A-Fib; Peripheral Venous Disease Gastrointestinal Medical History: Past Medical History Notes: Rectal Pain-Constipation Endocrine Medical History: Positive for: Type II Diabetes Time with diabetes: Since 2014 Treated with: Oral agents Blood sugar tested every day: Yes Tested : Genitourinary Medical History: Past Medical History Notes: CKD stage 3-BPH Musculoskeletal Medical History: Positive for: Osteoarthritis Neurologic Medical History: Positive for: Neuropathy Negative for: Quadriplegia; Paraplegia; Seizure Disorder Oncologic Medical History: Negative for: Received Chemotherapy; Received Radiation Past Medical History Notes: Colon Cancer-Colon Re-section Psychiatric  Medical History: Negative for: Anorexia/bulimia; Confinement Anxiety HBO Extended History Items Eyes: Cataracts Immunizations Pneumococcal Vaccine: Received Pneumococcal Vaccination: No XYLER, TERPENING ([id number]) [id number] Page 10 of 10 Implantable Devices None Hospitalization / Surgery History Type of Hospitalization/Surgery colon resection appendectomy multiple tooth extraction Family and Social History Cancer: Yes - Mother,Maternal Grandparents; Diabetes: No; Heart Disease: Yes - Maternal Grandparents; Hereditary Spherocytosis: No; Hypertension: Yes - Maternal Grandparents; Former smoker - ended on 09/14/1983; Marital Status - Single; Alcohol Use: Rarely; Drug Use: No History; Caffeine  Use: Never; Financial Concerns: No; Food, Clothing or Shelter Needs: No; Support System Lacking: No; Transportation Concerns: No Electronic Signature(s) Signed: 07/05/2023 4:26:53 PM By: Geralyn Corwin DO Entered By: Geralyn Corwin on 07/04/2023 17:12:35 -------------------------------------------------------------------------------- SuperBill Details Patient Name: Date of Service: [name], [name] 07/01/2023 Medical Record Number: [id number] Patient Account Number: [id number] Date of Birth/Sex: Treating RN: 11-22-1951 (72 y.o. [name] Primary Care Provider: [name] NDI Other Clinician: Referring Provider: Treating Provider/Extender: Cannon Kettle, BRA NDI Weeks in Treatment: 5 Diagnosis Coding ICD-10 Codes Code Description S81.801A Unspecified open wound, right lower leg, initial encounter I87.311 Chronic venous hypertension (idiopathic) with ulcer of right lower extremity I89.0 Lymphedema, not elsewhere classified E11.622 Type 2 diabetes mellitus with other skin ulcer Z89.512 Acquired absence of left leg below knee Facility Procedures : CPT4 Code: [id number] Description: (Facility Use Only) [phone] - APPLY MULTLAY COMPRS LWR RT LEG Modifier: Quantity: 1 Physician Procedures : CPT4 Code Description Modifier [id number] - WC PHYS LEVEL 3 - EST PT ICD-10 Diagnosis Description S81.801A Unspecified open wound, right lower leg, initial encounter I87.311 Chronic venous hypertension (idiopathic) with ulcer of right lower  extremity I89.0 Lymphedema, not elsewhere classified E11.622 Type 2 diabetes mellitus with other skin ulcer Quantity: 1 Electronic Signature(s) Signed: 07/05/2023 4:26:53 PM By: Geralyn Corwin DO Previous Signature: 07/03/2023 1:54:09 PM Version By: Shawn Stall RN, BSN Entered By: Geralyn Corwin on 07/04/2023 17:20:17

## 2023-07-12 ENCOUNTER — Encounter (HOSPITAL_BASED_OUTPATIENT_CLINIC_OR_DEPARTMENT_OTHER): Payer: 59

## 2023-07-12 ENCOUNTER — Encounter (HOSPITAL_BASED_OUTPATIENT_CLINIC_OR_DEPARTMENT_OTHER): Payer: 59 | Admitting: Internal Medicine

## 2023-07-12 DIAGNOSIS — E11622 Type 2 diabetes mellitus with other skin ulcer: Secondary | ICD-10-CM

## 2023-07-12 DIAGNOSIS — I890 Lymphedema, not elsewhere classified: Secondary | ICD-10-CM

## 2023-07-12 DIAGNOSIS — S81801A Unspecified open wound, right lower leg, initial encounter: Secondary | ICD-10-CM

## 2023-07-12 DIAGNOSIS — I87311 Chronic venous hypertension (idiopathic) with ulcer of right lower extremity: Secondary | ICD-10-CM

## 2023-07-12 NOTE — Progress Notes (Signed)
Allen, Reese ([id number]) [id number] Page 1 of 10 Visit Report for 07/12/2023 Chief Complaint Document Details Patient Name: Date of Service: [name] 07/12/2023 11:00 A M Medical Record Number: [id number] Patient Account Number: [id number] Date of Birth/Sex: Treating RN: [birth year] (72 y.o. M) Primary Care Provider: [name] NDI Other Clinician: Referring Provider: Treating Provider/Extender: Cannon Kettle, BRA NDI Weeks in Treatment: 7 Information Obtained from: Patient Chief Complaint 02/03/2022: The patient is here today for reevaluation of bilateral lower extremity edema and venous stasis dermatitis Electronic Signature(s) Signed: 07/12/2023 12:36:28 PM By: Geralyn Corwin DO Entered By: Geralyn Corwin on 07/12/2023 11:49:00 -------------------------------------------------------------------------------- HPI Details Patient Name: Date of Service: [name], Allen [name] 07/12/2023 11:00 A M Medical Record Number: [id number] Patient Account Number: [id number] Date of Birth/Sex: Treating RN: [birth year] (72 y.o. M) Primary Care Provider: [name] NDI Other Clinician: Referring Provider: Treating Provider/Extender: Geralyn Corwin HA RRIS, BRA NDI Weeks in Treatment: 7 History of Present Illness HPI Description: [name] is a 72 year old male with a past medical history of type 2 diabetes and essential hypertension who presents to the clinic today for bilateral lower extremity wounds. He was recently seen in the ED on 3/9 for bilateral lower extremity edema. He had a DVT study that was negative for clots. He was given triamcinolone cream and advised to elevate legs and obtain compression stockings. He has been using the triamcinolone cream however he is unable to use compression stockings due to difficulty of putting on. Patient states that for the past 2 years he has had opening and closing of weeping wounds to his  legs bilaterally. They spontaneously open and close. They are often open for several weeks before they heal spontaneously. He denies any purulent drainage, increased warmth [name] increased erythema to the skin. 4/21; patient presents for 1 week follow-up. The wounds on the right leg have healed however he has now 2 new wounds 1 on the anterior and posterior thigh that occurred from him scratching underneath the wrap. The left leg wound has improved and almost closed 4/28; patient presents for 1 week follow-up. He continues to have a wound on the right anterior leg that is showing signs of healing. The posterior right thigh has closed. He now has a new wound to the left leg that is limited to skin breakdown. 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 The wounds are occurring because he goes underneath the leg wraps and scratches creating new wounds. He has no complaints today. 5/5; patient presents for 1 week follow-up. He no longer has any open wounds to his lower extremities bilaterally. He continues to have scattered excoriation marks from where he scratches. These are scabbed over. He states he is going to see dermatology this month. Overall he is doing well and is happy with his care. Readmission 6/10 Patient was followed for open wounds limited to skin breakdown to his lower extremities bilaterally 2/2 venous insufficiency. Once the wounds healed he was discharged with juxta lite compressions and these were placed in office. He was discharged on 5/5. He states that he has not taken the wraps off since discharge and has had them on for over a month. He did not want to take them off because they were very comfortable. He does not have any difficulty putting these on and off if he wanted to. He states that he does not take  a shower and just cleans the other parts of his body at the sink. He noticed that 2 days ago he was having  some drainage at the top of the wrap and called in to be evaluated. He states that he has not seen the wounds until the wraps were Allen, Reese ([id number]) [id number] Page 2 of 10 taken off today in office. He denies pain [name] increased warmth [name] erythema to the area 6/27; patient presents for 2-week follow-up. He has had increased swelling and drainage to the wounds over the last week. He reports increased erythema to the area. He does not have pain however. He denies purulent drainage [name] fever/chills. 7/5; patient presents for 1 week follow-up. He was prescribed Keflex at last clinic visit and is currently taking this. He reports significant improvement to his leg swelling, tenderness and erythema. He denies signs of infection. 7/21; patient presents for 2-week follow-up. He reports improvement in healing in his right lower extremity. Unfortunately this morning he noticed skin breakdown to his left lower extremity with increased redness and warmth. 7/28; patient presents for 1 week follow-up. He reports completing his antibiotics. He states that his wounds are healed. He has been using his juxta light compressions. He has no issues [name] complaints today. Readmission 12/17/2021; This is a 72 year old man who has been seen twice by Dr. Mikey Bussing for wounds on his bilateral lower extremities secondary to chronic venous insufficiency. Most recently this was from 05/22/2021 through 07/09/2021 with bilateral lower extremity wounds largely secondary to chronic venous insufficiency with stasis dermatitis. He has been discharged with juxta lite stockings. The patient tells me that about a month ago he developed too much edema to get his juxta lites on. He developed increased swelling. He saw his primary physician who gave him Lasix and the swelling seems to have come down. 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 He says this  dates back into his adolescence and he could not walk barefoot etc. As a result of this he sometimes leaves his shoes and socks on for weeks and he is done that recently.[name] He arrives in clinic with not any wounds on his legs however with close inspection he had a fairly deep wound in the left second and third toe webspace His skin on his feet was not in good shape macerated flaking malodorous The patient's past medical history has not changed. I note that he had arterial studies done on 06/03/2021 that showed an ABI on the right of 1.26 with triphasic waveforms TBI of 0.63 on the right on the left ABI of 0.85 TBI of 0.63 again with triphasic waveforms 1/19; since the patient was last here he was seen in the hospital for bradycardia. He had his medications adjusted digoxin metoprolol. He tells me they dressed the wound between the left second and third toe. The patient says that he had open wounds on his dorsal foot when we took off the dressing today. I was not exactly sure what he had on their. He had 2 wounds on the left dorsal foot with some significant surrounding erythema.The wound between the left second and third toe was just about closed however when we opened it to examine it today it it opened and a small area. The skin on his lower legs especially the left looks a lot better using triamcinolone and Cetaphil which she is applying He was on cephalexin prior described I think by primary care before  he came here he completed this about 3 [name] 4 days ago 1/26; the area between the left second and third toes is healed. He still has 2 small areas on the left dorsal foot. The erythema that I marked around this last week seems better. He is completing his doxycycline. I also gave him triamcinolone and Cetaphil last week for the dry flaking irritated inflamed skin on his dorsal feet and lower legs this all seems better. He has been wearing his juxta lite stockings 2/2; everything is healed here. He is  now treating his skin with I think both the triamcinolone and Cetaphil mixture that I prescribed and probably pure triamcinolone that the dermatologist prescribed. I told him to use my compounded steroid cream as a moisture and anti-itch and inflammation cream when he is finished without to go to a pure moisturizer I emphasized that steroids under compression even if its a juxta lite stocking can get absorbed and secondarily can cause skin atrophy. I asked him not to use both. On an optimistic front he seems a lot more interested in treating his skin and watching for areas on his feet that are breaking down. His skin looks a lot better he has a juxta lite stocking 2/14; patient we discharged 2 weeks ago. He has noted increasing edema in the right greater than left leg erythema and intense pruritus. Arrives in clinic really with no open wound however considerable amount of marks on his upper right leg from scratching. He claims compliance with his juxta lites up with up to a week ago on the right. He has been wearing 1 on the left. He has home health nurse put kerlix on this 02/03/2022: No open wounds. He has been in 3 layer compression, which is slipped down and caused some "mushrooming" affect from just below the tibial tuberosity to the knee. The excoriations on his legs have resolved. He reports being compliant with moisturizing and elevation of his legs. He saw his cardiologist yesterday and was prescribed 20 mg of Lasix to be used on an as-needed basis. Readmission 06/01/2022 [name] is a 72 year old male with a past medical history of venous insufficiency, lymphedema, type 2 diabetes that presents to the clinic for a 1 month history of worsening leg wounds. He states he has not been wearing his compression wraps for the past 2 months. He states his Lasix was stopped by his primary doctor and then his legs became more swollen and he could not wear his compression stockings. He states  eventually he was put back on Lasix. He still has been unable to wear his compression stockings and although his swelling has improved slightly his wounds have not improved. He has increased warmth and erythema to the right lower extremity. 6/29; patient presents for follow-up. He completed his course of Keflex without issues. We have been using silver alginate with antibiotic ointment under compression therapy. He has no issues [name] complaints today. He has his juxta lite compressions with him today. 7/7; patient presents for follow-up. We have been using silver alginate under 3 layer compression. Patient has done well with this. His wounds have healed. He states he is going to get custom garment compression wraps. For now he has his juxta lite compressions. 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 He states that  he had a shrinker on and when taken off it peeled back the skin. He has been healing however has 2 open areas still present. He has been keeping the area covered. He also reports open wounds to the right lower extremity but these have healed with Ace wraps. He does have juxta lite compression Velcro wraps at home. He has home health that changes his dressings. He currently denies signs of infection. 6/18; patient presents for follow-up. We have been using Hydrofera Blue to the wound beds under Tubigrip. Wounds are smaller. 7/8; patient presents for follow-up. We have been using Hydrofera Blue to the wound beds on the left stump under Tubigrip. His wounds of healed. He did have some areas of irritation where the tape pulled. He has developed a new wound to the right lower extremity. He has been sleeping in his chair. He developed more swelling and how has a new wound. 7/19; patient  presents for follow-up. We have been using collagen under 3 layer compression to the right lower extremity. He has had some irritation from the wrap and now has a new wound to the posterior right leg. 7/30; patient presents for follow-up. We have been using silver alginate under 3 layer compression to the right lower extremity wounds. He has 1 wound remaining to the posterior aspect. Electronic Signature(s) Signed: 07/12/2023 12:36:28 PM By: Landry Corporal ([id number]) [id number] Page 3 of 10 Entered By: Geralyn Corwin on 07/12/2023 11:49:38 -------------------------------------------------------------------------------- Physical Exam Details Patient Name: Date of Service: Allen Holster [name] 07/12/2023 11:00 A M Medical Record Number: [id number] Patient Account Number: [id number] Date of Birth/Sex: Treating RN: 05/20/1951 (72 y.o. M) Primary Care Provider: [name] NDI Other Clinician: Referring Provider: Treating Provider/Extender: Geralyn Corwin HA RRIS, BRA NDI Weeks in Treatment: 7 Constitutional respirations regular, non-labored and within target range for patient.. Cardiovascular 2+ dorsalis pedis/posterior tibialis pulses. Psychiatric pleasant and cooperative. Notes Right lower extremity: Venous stasis dermatitis. T the anterior aspect there is epithelialization to the previous wound. Posterior aspect with granulation tissue. o good edema control. No signs of active surrounding soft tissue infection. Electronic Signature(s) Signed: 07/12/2023 12:36:28 PM By: Geralyn Corwin DO Entered By: Geralyn Corwin on 07/12/2023 11:52:15 -------------------------------------------------------------------------------- Physician Orders Details Patient Name: Date of Service: [name], Allen [name] 07/12/2023 11:00 A M Medical Record Number: [id number] Patient Account Number: [id number] Date of Birth/Sex: Treating RN: 13-Jul-1951 (72  y.o. [name] Primary Care Provider: [name] NDI Other Clinician: Referring Provider: Treating Provider/Extender: Geralyn Corwin HA RRIS, BRA NDI Weeks in Treatment: 7 Verbal / Phone Orders: No Diagnosis Coding Follow-up Appointments ppointment in 1 week. - Dr. Mikey Bussing 115pm 07/12/2023 Return A Tuesday room 7 Anesthetic (In clinic) Topical Lidocaine 4% applied to wound bed Bathing/ Shower/ Hygiene May shower with protection but do not get wound dressing(s) wet. Protect dressing(s) with water repellant cover (for example, large plastic bag) [name] a cast cover and may then take shower. Edema Control - Lymphedema / SCD / Other Bilateral Lower Extremities Lymphedema Pumps. Use Lymphedema pumps on leg(s) 2-3 times a day for 45-60 minutes. If wearing any wraps [name] hose, do not remove them. Continue exercising as instructed. - LymphaCare pumps. Once it arrives start using x2 times a day for an hour each time. Compression stocking [name] Garment 20-30 mm/Hg pressure to: - has juxtalite HD for right leg. DO not wear until healed. Other Edema Control Orders/Instructions: - tubigrip to size E to left stump.  Wound Treatment Wound #16 - Lower Leg Wound Laterality: Right [name], [name] ([id number]) [id number] Page 4 of 10 Cleanser: Soap and Water 1 x Per Week/30 Days Discharge Instructions: May shower and wash wound with dial antibacterial soap and water prior to dressing change. Cleanser: Wound Cleanser 1 x Per Week/30 Days Discharge Instructions: Cleanse the wound with wound cleanser prior to applying a clean dressing using gauze sponges, not tissue [name] cotton balls. Peri-Wound Care: Triamcinolone 15 (g) 1 x Per Week/30 Days Discharge Instructions: Use triamcinolone 15 (g) as directed Peri-Wound Care: Sween Lotion (Moisturizing lotion) 1 x Per Week/30 Days Discharge Instructions: Apply moisturizing lotion as directed Topical: Gentamicin 1 x Per Week/30  Days Discharge Instructions: mix with mupirocin. ****IN CLINIC ONLY*** Topical: Mupirocin Ointment 1 x Per Week/30 Days Discharge Instructions: Apply Mupirocin (Bactroban) as instructed. MIX WITH GENTAMICIN. ****IN CLINIC ONLY.*** Prim Dressing: Maxorb Extra Ag+ Alginate Dressing, 4x4.75 (in/in) 1 x Per Week/30 Days ary Discharge Instructions: Apply to wound bed as instructed Secondary Dressing: ABD Pad, 8x10 1 x Per Week/30 Days Discharge Instructions: Apply over primary dressing as directed. Compression Wrap: Urgo K2 Lite, (equivalent to a 3 layer) two layer compression system, regular 1 x Per Week/30 Days Discharge Instructions: Apply Urgo K2 Lite as directed (alternative to 3 layer compression). unna boot first layer of upper portion of lower leg. Electronic Signature(s) Signed: 07/12/2023 12:36:28 PM By: Geralyn Corwin DO Entered By: Geralyn Corwin on 07/12/2023 11:53:04 -------------------------------------------------------------------------------- Problem List Details Patient Name: Date of Service: [name], Allen [name] 07/12/2023 11:00 A M Medical Record Number: [id number] Patient Account Number: [id number] Date of Birth/Sex: Treating RN: 1951/02/02 (72 y.o. M) Primary Care Provider: [name] NDI Other Clinician: Referring Provider: Treating Provider/Extender: Galen Manila NDI Weeks in Treatment: 7 Active Problems ICD-10 Encounter Code Description Active Date MDM Diagnosis S81.801A Unspecified open wound, right lower leg, initial encounter 06/20/2023 No Yes I87.311 Chronic venous hypertension (idiopathic) with ulcer of right lower extremity 07/01/2023 No Yes I89.0 Lymphedema, not elsewhere classified 05/24/2023 No Yes E11.622 Type 2 diabetes mellitus with other skin ulcer 05/24/2023 No Yes Z89.512 Acquired absence of left leg below knee 05/24/2023 No Yes Doetsch, Chaseton L ([id number]) [id number] Page 5 of 10 Inactive  Problems Resolved Problems ICD-10 Code Description Active Date Resolved Date L97.822 Non-pressure chronic ulcer of other part of left lower leg with fat layer exposed 05/24/2023 05/24/2023 Electronic Signature(s) Signed: 07/12/2023 12:36:28 PM By: Geralyn Corwin DO Entered By: Geralyn Corwin on 07/12/2023 11:48:28 -------------------------------------------------------------------------------- Progress Note Details Patient Name: Date of Service: [name], Allen [name] 07/12/2023 11:00 A M Medical Record Number: [id number] Patient Account Number: [id number] Date of Birth/Sex: Treating RN: 20-Apr-1951 (72 y.o. M) Primary Care Provider: [name] NDI Other Clinician: Referring Provider: Treating Provider/Extender: Cannon Kettle, BRA NDI Weeks in Treatment: 7 Subjective Chief Complaint Information obtained from Patient 02/03/2022: The patient is here today for reevaluation of bilateral lower extremity edema and venous stasis dermatitis History of Present Illness (HPI) [name] is a 72 year old male with a past medical history of type 2 diabetes and essential hypertension who presents to the clinic today for bilateral lower extremity wounds. He was recently seen in the ED on 3/9 for bilateral lower extremity edema. He had a DVT study that was negative for clots. He was given triamcinolone cream and advised to elevate legs and obtain compression stockings. He has been using the triamcinolone cream however he is unable to use compression stockings  due to difficulty of putting on. Patient states that for the past 2 years he has had opening and closing of weeping wounds to his legs bilaterally. They spontaneously open and close. They are often open for several weeks before they heal spontaneously. He denies any purulent drainage, increased warmth [name] increased erythema to the skin. 4/21; patient presents for 1 week follow-up. The wounds on the right leg have healed however he has  now 2 new wounds 1 on the anterior and posterior thigh that occurred from him scratching underneath the wrap. The left leg wound has improved and almost closed 4/28; patient presents for 1 week follow-up. He continues to have a wound on the right anterior leg that is showing signs of healing. The posterior right thigh has closed. He now has a new wound to the left leg that is limited to skin breakdown. 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 The wounds are occurring because he goes underneath the leg wraps and scratches creating new wounds. He has no complaints today. 5/5; patient presents for 1 week follow-up. He no longer has any open wounds to his lower extremities bilaterally. He continues to have scattered excoriation marks from where he scratches. These are scabbed over. He states he is going to see dermatology this month. Overall he is doing well and is happy with his care. Readmission 6/10 Patient was followed for open wounds limited to skin breakdown to his lower extremities bilaterally 2/2 venous insufficiency. Once the wounds healed he was discharged with juxta lite compressions and these were placed in office. He was discharged on 5/5. He states that he has not taken the wraps off since discharge and has had them on for over a month. He did not want to take them off because they were very comfortable. He does not have any difficulty putting these on and off if he wanted to. He states that he does not take a shower and just cleans the other parts of his body at the sink. He noticed that 2 days ago he was having some drainage at the top of the wrap and called in to be evaluated. He states that he has not seen the wounds until the wraps were taken off today in office. He denies pain [name] increased warmth [name] erythema to the area 6/27; patient presents for 2-week follow-up. He has had increased swelling and drainage to the  wounds over the last week. He reports increased erythema to the area. He does not have pain however. He denies purulent drainage [name] fever/chills. 7/5; patient presents for 1 week follow-up. He was prescribed Keflex at last clinic visit and is currently taking this. He reports significant improvement to his leg swelling, tenderness and erythema. He denies signs of infection. 7/21; patient presents for 2-week follow-up. He reports improvement in healing in his right lower extremity. Unfortunately this morning he noticed skin breakdown to his left lower extremity with increased redness and warmth. Allen, Reese ([id number]) [id number] Page 6 of 10 7/28; patient presents for 1 week follow-up. He reports completing his antibiotics. He states that his wounds are healed. He has been using his juxta light compressions. He has no issues [name] complaints today. Readmission 12/17/2021; This is a 72 year old man who has been seen twice by Dr. Mikey Bussing for wounds on his bilateral lower extremities secondary to chronic venous insufficiency. Most recently this was from 05/22/2021 through 07/09/2021 with bilateral lower extremity wounds  largely secondary to chronic venous insufficiency with stasis dermatitis. He has been discharged with juxta lite stockings. The patient tells me that about a month ago he developed too much edema to get his juxta lites on. He developed increased swelling. He saw his primary physician who gave him Lasix and the swelling seems to have come down. 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 He says this dates back into his adolescence and he could not walk barefoot etc. As a result of this he sometimes leaves his shoes and socks on for weeks and he is done that recently.[name] He arrives in clinic with not any wounds on his legs however with close inspection he had a fairly deep wound in the left second and third toe  webspace His skin on his feet was not in good shape macerated flaking malodorous The patient's past medical history has not changed. I note that he had arterial studies done on 06/03/2021 that showed an ABI on the right of 1.26 with triphasic waveforms TBI of 0.63 on the right on the left ABI of 0.85 TBI of 0.63 again with triphasic waveforms 1/19; since the patient was last here he was seen in the hospital for bradycardia. He had his medications adjusted digoxin metoprolol. He tells me they dressed the wound between the left second and third toe. The patient says that he had open wounds on his dorsal foot when we took off the dressing today. I was not exactly sure what he had on their. He had 2 wounds on the left dorsal foot with some significant surrounding erythema.The wound between the left second and third toe was just about closed however when we opened it to examine it today it it opened and a small area. The skin on his lower legs especially the left looks a lot better using triamcinolone and Cetaphil which she is applying He was on cephalexin prior described I think by primary care before he came here he completed this about 3 [name] 4 days ago 1/26; the area between the left second and third toes is healed. He still has 2 small areas on the left dorsal foot. The erythema that I marked around this last week seems better. He is completing his doxycycline. I also gave him triamcinolone and Cetaphil last week for the dry flaking irritated inflamed skin on his dorsal feet and lower legs this all seems better. He has been wearing his juxta lite stockings 2/2; everything is healed here. He is now treating his skin with I think both the triamcinolone and Cetaphil mixture that I prescribed and probably pure triamcinolone that the dermatologist prescribed. I told him to use my compounded steroid cream as a moisture and anti-itch and inflammation cream when he is finished without to go to a pure moisturizer  I emphasized that steroids under compression even if its a juxta lite stocking can get absorbed and secondarily can cause skin atrophy. I asked him not to use both. On an optimistic front he seems a lot more interested in treating his skin and watching for areas on his feet that are breaking down. His skin looks a lot better he has a juxta lite stocking 2/14; patient we discharged 2 weeks ago. He has noted increasing edema in the right greater than left leg erythema and intense pruritus. Arrives in clinic really with no open wound however considerable amount of marks on his upper right leg from scratching. He claims compliance  with his juxta lites up with up to a week ago on the right. He has been wearing 1 on the left. He has home health nurse put kerlix on this 02/03/2022: No open wounds. He has been in 3 layer compression, which is slipped down and caused some "mushrooming" affect from just below the tibial tuberosity to the knee. The excoriations on his legs have resolved. He reports being compliant with moisturizing and elevation of his legs. He saw his cardiologist yesterday and was prescribed 20 mg of Lasix to be used on an as-needed basis. Readmission 06/01/2022 [name] is a 72 year old male with a past medical history of venous insufficiency, lymphedema, type 2 diabetes that presents to the clinic for a 1 month history of worsening leg wounds. He states he has not been wearing his compression wraps for the past 2 months. He states his Lasix was stopped by his primary doctor and then his legs became more swollen and he could not wear his compression stockings. He states eventually he was put back on Lasix. He still has been unable to wear his compression stockings and although his swelling has improved slightly his wounds have not improved. He has increased warmth and erythema to the right lower extremity. 6/29; patient presents for follow-up. He completed his course of Keflex without  issues. We have been using silver alginate with antibiotic ointment under compression therapy. He has no issues [name] complaints today. He has his juxta lite compressions with him today. 7/7; patient presents for follow-up. We have been using silver alginate under 3 layer compression. Patient has done well with this. His wounds have healed. He states he is going to get custom garment compression wraps. For now he has his juxta lite compressions. 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 He states that he had a shrinker on and when taken off it peeled back the skin. He has been healing however has 2 open areas still present. He has been keeping the area covered. He also reports open wounds to the right lower extremity but these have healed with Ace wraps. He does have juxta lite compression Velcro wraps at home. He has home health that changes his dressings. He currently denies signs of infection. 6/18; patient presents for follow-up. We have been using Hydrofera Blue to the wound beds under Tubigrip. Wounds are smaller. 7/8; patient presents for follow-up. We have been using Hydrofera Blue to the wound beds on the left stump under Tubigrip. His wounds of healed. He did have some areas of irritation where the tape pulled. He has developed a new wound to the right lower extremity. He has been sleeping in his chair. He developed more swelling and how has a new wound. 7/19; patient presents for follow-up. We have been using collagen under 3 layer compression to the right lower extremity. He has had some irritation from the wrap and now has a new wound to the posterior right leg. 7/30; patient presents for follow-up. We have been using silver alginate under 3 layer compression to the right lower  extremity wounds. He has 1 wound remaining to the posterior aspect. Patient History Unable to Obtain Patient History due to Altered Mental Status. Information obtained from Patient. Family History Cancer - Mother,Maternal Grandparents, Heart Disease - Maternal Grandparents, Hypertension - Maternal Grandparents, No  family history of Diabetes, Hereditary Spherocytosis. Social History Former smoker - ended on 09/14/1983, Marital Status - Single, Alcohol Use - Rarely, Drug Use - No History, Caffeine Use - Never. Medical History Eyes [name], [name] ([id number]) [id number] Page 7 of 10 Patient has history of Cataracts - 2014 Hematologic/Lymphatic Patient has history of Lymphedema Cardiovascular Patient has history of Arrhythmia - A-Fib, Peripheral Venous Disease Endocrine Patient has history of Type II Diabetes Musculoskeletal Patient has history of Osteoarthritis Neurologic Patient has history of Neuropathy Denies history of Quadriplegia, Paraplegia, Seizure Disorder Oncologic Denies history of Received Chemotherapy, Received Radiation Psychiatric Denies history of Anorexia/bulimia, Confinement Anxiety Hospitalization/Surgery History - colon resection. - appendectomy. - multiple tooth extraction. Medical A Surgical History Notes nd Hematologic/Lymphatic Hyperbilirubinemia Gastrointestinal Rectal Pain-Constipation Genitourinary CKD stage 3-BPH Oncologic Colon Cancer-Colon Re-section Objective Constitutional respirations regular, non-labored and within target range for patient.. Vitals Time Taken: 11:26 AM, Temperature: 98.3 F, Pulse: 73 bpm, Respiratory Rate: 18 breaths/min, Blood Pressure: 132/66 mmHg. Cardiovascular 2+ dorsalis pedis/posterior tibialis pulses. Psychiatric pleasant and cooperative. General Notes: Right lower extremity: Venous stasis dermatitis. T the anterior aspect there is epithelialization to the previous wound. Posterior  aspect with o granulation tissue. good edema control. No signs of active surrounding soft tissue infection. Integumentary (Hair, Skin) Wound #16 status is Open. Original cause of wound was Gradually Appeared. The date acquired was: 06/14/2023. The wound has been in treatment 3 weeks. The wound is located on the Right Lower Leg. The wound measures 0.1cm length x 0.1cm width x 0.1cm depth; 0.008cm^2 area and 0.001cm^3 volume. There is Fat Layer (Subcutaneous Tissue) exposed. There is no tunneling [name] undermining noted. There is a medium amount of serosanguineous drainage noted. The wound margin is distinct with the outline attached to the wound base. There is large (67-100%) red, pink granulation within the wound bed. There is a small (1- 33%) amount of necrotic tissue within the wound bed. The periwound skin appearance exhibited: Scarring, Hemosiderin Staining. The periwound skin appearance did not exhibit: Callus, Crepitus, Excoriation, Induration, Rash, Dry/Scaly, Maceration, Atrophie Blanche, Cyanosis, Ecchymosis, Mottled, Pallor, Rubor, Erythema. Periwound temperature was noted as No Abnormality. Assessment Active Problems ICD-10 Unspecified open wound, right lower leg, initial encounter Chronic venous hypertension (idiopathic) with ulcer of right lower extremity Lymphedema, not elsewhere classified Type 2 diabetes mellitus with other skin ulcer Acquired absence of left leg below knee Patient's posterior leg wound is almost healed. I recommended continue the course with silver alginate and antibiotic ointment under 3 layer compression. Lymphedema pumps have been ordered. Patient has not received them yet. Procedures ERRIK, BUCHANON ([id number]) [id number] Page 8 of 10 Wound #16 Pre-procedure diagnosis of Wound #16 is a Lymphedema located on the Right Lower Leg . There was a Double Layer Compression Therapy Procedure by Shawn Stall, RN. Post procedure Diagnosis  Wound #16: Same as Pre-Procedure Plan Follow-up Appointments: Return Appointment in 1 week. - Dr. Mikey Bussing 115pm 07/12/2023 Tuesday room 7 Anesthetic: (In clinic) Topical Lidocaine 4% applied to wound bed Bathing/ Shower/ Hygiene: May shower with protection but do not get wound dressing(s) wet. Protect dressing(s) with water repellant cover (for example, large plastic bag) [name] a cast cover and may then take shower. Edema Control - Lymphedema / SCD / Other: Lymphedema Pumps. Use Lymphedema pumps on leg(s) 2-3 times a day for 45-60 minutes. If wearing any wraps [name] hose, do not remove them. Continue exercising as instructed. - LymphaCare pumps. Once it arrives start using x2 times a day for an hour  each time. Compression stocking [name] Garment 20-30 mm/Hg pressure to: - has juxtalite HD for right leg. DO not wear until healed. Other Edema Control Orders/Instructions: - tubigrip to size E to left stump. WOUND #16: - Lower Leg Wound Laterality: Right Cleanser: Soap and Water 1 x Per Week/30 Days Discharge Instructions: May shower and wash wound with dial antibacterial soap and water prior to dressing change. Cleanser: Wound Cleanser 1 x Per Week/30 Days Discharge Instructions: Cleanse the wound with wound cleanser prior to applying a clean dressing using gauze sponges, not tissue [name] cotton balls. Peri-Wound Care: Triamcinolone 15 (g) 1 x Per Week/30 Days Discharge Instructions: Use triamcinolone 15 (g) as directed Peri-Wound Care: Sween Lotion (Moisturizing lotion) 1 x Per Week/30 Days Discharge Instructions: Apply moisturizing lotion as directed Topical: Gentamicin 1 x Per Week/30 Days Discharge Instructions: mix with mupirocin. ****IN CLINIC ONLY*** Topical: Mupirocin Ointment 1 x Per Week/30 Days Discharge Instructions: Apply Mupirocin (Bactroban) as instructed. MIX WITH GENTAMICIN. ****IN CLINIC ONLY.*** Prim Dressing: Maxorb Extra Ag+ Alginate Dressing, 4x4.75 (in/in) 1 x Per Week/30  Days ary Discharge Instructions: Apply to wound bed as instructed Secondary Dressing: ABD Pad, 8x10 1 x Per Week/30 Days Discharge Instructions: Apply over primary dressing as directed. Com pression Wrap: Urgo K2 Lite, (equivalent to a 3 layer) two layer compression system, regular 1 x Per Week/30 Days Discharge Instructions: Apply Urgo K2 Lite as directed (alternative to 3 layer compression). unna boot first layer of upper portion of lower leg. 1. Silver alginate with antibiotic ointment under 3 layer compressionright lower extremity 2. Follow-up in 1 week Electronic Signature(s) Signed: 07/12/2023 12:36:28 PM By: Geralyn Corwin DO Entered By: Geralyn Corwin on 07/12/2023 11:57:15 -------------------------------------------------------------------------------- HxROS Details Patient Name: Date of Service: [name], Allen [name] 07/12/2023 11:00 A M Medical Record Number: [id number] Patient Account Number: [id number] Date of Birth/Sex: Treating RN: [birth year] (72 y.o. M) Primary Care Provider: [name] NDI Other Clinician: Referring Provider: Treating Provider/Extender: Geralyn Corwin HA RRIS, BRA NDI Weeks in Treatment: 7 Unable to Obtain Patient History due to Altered Mental Status Information Obtained From Patient Eyes Medical History: Positive for: Cataracts - 2014 NETTIE, FACENDA ([id number]) [id number] Page 9 of 10 Hematologic/Lymphatic Medical History: Positive for: Lymphedema Past Medical History Notes: Hyperbilirubinemia Cardiovascular Medical History: Positive for: Arrhythmia - A-Fib; Peripheral Venous Disease Gastrointestinal Medical History: Past Medical History Notes: Rectal Pain-Constipation Endocrine Medical History: Positive for: Type II Diabetes Time with diabetes: Since 2014 Treated with: Oral agents Blood sugar tested every day: Yes Tested : Genitourinary Medical History: Past Medical History Notes: CKD stage  3-BPH Musculoskeletal Medical History: Positive for: Osteoarthritis Neurologic Medical History: Positive for: Neuropathy Negative for: Quadriplegia; Paraplegia; Seizure Disorder Oncologic Medical History: Negative for: Received Chemotherapy; Received Radiation Past Medical History Notes: Colon Cancer-Colon Re-section Psychiatric Medical History: Negative for: Anorexia/bulimia; Confinement Anxiety HBO Extended History Items Eyes: Cataracts Immunizations Pneumococcal Vaccine: Received Pneumococcal Vaccination: No Implantable Devices None Hospitalization / Surgery History Type of Hospitalization/Surgery colon resection appendectomy multiple tooth extraction Family and Social History Cancer: Yes - Mother,Maternal Grandparents; Diabetes: No; Heart Disease: Yes - Maternal Grandparents; Hereditary Spherocytosis: No; Hypertension: Yes - Maternal Grandparents; Former smoker - ended on 09/14/1983; Marital Status - Single; Alcohol Use: Rarely; Drug Use: No History; Caffeine Use: Never; KINGMESSIAH, MAHARAJ ([id number]) [id number] Page 10 of 10 Financial Concerns: No; Food, Clothing [name] Shelter Needs: No; Support System Lacking: No; Transportation Concerns: No Electronic Signature(s) Signed: 07/12/2023 12:36:28 PM By: Geralyn Corwin DO Entered By: Mikey Bussing,  Jaxson Anglin on 07/12/2023 11:49:44 -------------------------------------------------------------------------------- SuperBill Details Patient Name: Date of Service: [name] 07/12/2023 Medical Record Number: [id number] Patient Account Number: [id number] Date of Birth/Sex: Treating RN: 1951/11/01 (72 y.o. [name] Primary Care Provider: [name] NDI Other Clinician: Referring Provider: Treating Provider/Extender: Cannon Kettle, BRA NDI Weeks in Treatment: 7 Diagnosis Coding ICD-10 Codes Code Description S81.801A Unspecified open wound, right lower leg, initial  encounter I87.311 Chronic venous hypertension (idiopathic) with ulcer of right lower extremity I89.0 Lymphedema, not elsewhere classified E11.622 Type 2 diabetes mellitus with other skin ulcer Z89.512 Acquired absence of left leg below knee Facility Procedures : CPT4 Code: 54627035 Description: (Facility Use Only) [id number] - APPLY MULTLAY COMPRS LWR RT LEG Modifier: Quantity: 1 Physician Procedures : CPT4 Code Description Modifier [id number] - WC PHYS LEVEL 3 - EST PT ICD-10 Diagnosis Description S81.801A Unspecified open wound, right lower leg, initial encounter I87.311 Chronic venous hypertension (idiopathic) with ulcer of right lower  extremity I89.0 Lymphedema, not elsewhere classified E11.622 Type 2 diabetes mellitus with other skin ulcer Quantity: 1 Electronic Signature(s) Signed: 07/12/2023 12:36:28 PM By: Geralyn Corwin DO Entered By: Geralyn Corwin on 07/12/2023 11:57:31

## 2023-07-13 NOTE — Progress Notes (Signed)
Allen, Reese ([id number]) [id number] Page 1 of 7 Visit Report for 07/12/2023 Arrival Information Details Patient Name: Date of Service: [name] 07/12/2023 11:00 A M Medical Record Number: [id number] Patient Account Number: [id number] Date of Birth/Sex: Treating RN: 11-09-1951 (72 y.o. M) Primary Care Gaven Eugene: HA Belinda Block NDI Other Clinician: Referring Sumiya Mamaril: Treating Adja Ruff/Extender: Geralyn Corwin HA RRIS, BRA NDI Weeks in Treatment: 7 Visit Information History Since Last Visit Added or deleted any medications: No Patient Arrived: Wheel Chair Any new allergies or adverse reactions: No Arrival Time: 11:20 Had a fall or experienced change in No Accompanied By: self activities of daily living that may affect Transfer Assistance: None risk of falls: Patient Identification Verified: Yes Signs or symptoms of abuse/neglect since last visito No Secondary Verification Process Completed: Yes Hospitalized since last visit: No Patient Requires Transmission-Based Precautions: No Implantable device outside of the clinic excluding No Patient Has Alerts: No cellular tissue based products placed in the center since last visit: Has Dressing in Place as Prescribed: Yes Has Compression in Place as Prescribed: Yes Pain Present Now: No Electronic Signature(s) Signed: 07/12/2023 4:56:42 PM By: Thayer Dallas Entered By: Thayer Dallas on 07/12/2023 11:24:37 -------------------------------------------------------------------------------- Compression Therapy Details Patient Name: Date of Service: [name], [name]. 07/12/2023 11:00 A M Medical Record Number: [id number] Patient Account Number: [id number] Date of Birth/Sex: Treating RN: January 17, 1951 (72 y.o. [name] Primary Care Ashraf Mesta: [name] NDI Other Clinician: Referring Ariatna Jester: Treating Obaloluwa Delatte/Extender: Geralyn Corwin HA RRIS, BRA NDI Weeks in Treatment: 7 Compression  Therapy Performed for Wound Assessment: Wound #16 Right Lower Leg Performed By: Clinician Shawn Stall, RN Compression Type: Double Layer Post Procedure Diagnosis Same as Pre-procedure Electronic Signature(s) Signed: 07/12/2023 6:21:10 PM By: Shawn Stall RN, BSN Entered By: Shawn Stall on 07/12/2023 11:45:56 Perri, Rande Brunt ([id number]) [id number] Page 2 of 7 -------------------------------------------------------------------------------- Encounter Discharge Information Details Patient Name: Date of Service: [name] 07/12/2023 11:00 A M Medical Record Number: [id number] Patient Account Number: [id number] Date of Birth/Sex: Treating RN: Oct 28, 1951 (72 y.o. [name] Primary Care Yarielis Funaro: [name] NDI Other Clinician: Referring Carden Teel: Treating Josselin Gaulin/Extender: Geralyn Corwin HA RRIS, BRA NDI Weeks in Treatment: 7 Encounter Discharge Information Items Discharge Condition: Stable Ambulatory Status: Wheelchair Discharge Destination: Home Transportation: Private Auto Accompanied By: self Schedule Follow-up Appointment: Yes Clinical Summary of Care: Electronic Signature(s) Signed: 07/12/2023 6:21:10 PM By: Shawn Stall RN, BSN Entered By: Shawn Stall on 07/12/2023 11:50:01 -------------------------------------------------------------------------------- Lower Extremity Assessment Details Patient Name: Date of Service: [name], [name]. 07/12/2023 11:00 A M Medical Record Number: [id number] Patient Account Number: [id number] Date of Birth/Sex: Treating RN: [birth year] (72 y.o. M) Primary Care Rashae Rother: [name] NDI Other Clinician: Referring Tamicka Shimon: Treating Tavish Gettis/Extender: Geralyn Corwin HA RRIS, BRA NDI Weeks in Treatment: 7 Edema Assessment Assessed: [Left: No] [Right: No] Edema: [Left: Ye] [Right: s] Calf Left: Right: Point of Measurement: 37 cm From Medial Instep 40.5 cm Ankle Left: Right: Point of  Measurement: 11 cm From Medial Instep 23 cm Vascular Assessment Extremity colors, hair growth, and conditions: Extremity Color: [Right:Hyperpigmented] Hair Growth on Extremity: [Right:No] Temperature of Extremity: [Right:Warm] Capillary Refill: [Right:< 3 seconds] Dependent Rubor: [Right:No Yes] Electronic Signature(s) Signed: 07/12/2023 4:56:42 PM By: Thayer Dallas Entered By: Thayer Dallas on 07/12/2023 11:31:52 [name] ([id number]) [id number] Page 3 of 7 -------------------------------------------------------------------------------- Multi Wound Chart Details Patient Name: Date of Service: [name], [name]. 07/12/2023 11:00  A M Medical Record Number: [id number] Patient Account Number: [id number] Date of Birth/Sex: Treating RN: Oct 28, 1951 (72 y.o. M) Primary Care Airyanna Dipalma: [name] NDI Other Clinician: Referring Adrick Kestler: Treating Almadelia Looman/Extender: Geralyn Corwin HA RRIS, BRA NDI Weeks in Treatment: 7 Vital Signs Height(in): Pulse(bpm): 73 Weight(lbs): Blood Pressure(mmHg): 132/66 Body Mass Index(BMI): Temperature(F): 98.3 Respiratory Rate(breaths/min): 18 [16:Photos:] [N/A:N/A] Right Lower Leg N/A N/A Wound Location: Gradually Appeared N/A N/A Wounding Event: Lymphedema N/A N/A Primary Etiology: Cataracts, Lymphedema, Arrhythmia, N/A N/A Comorbid History: Peripheral Venous Disease, Type II Diabetes, Osteoarthritis, Neuropathy 06/14/2023 N/A N/A Date Acquired: 3 N/A N/A Weeks of Treatment: Open N/A N/A Wound Status: No N/A N/A Wound Recurrence: 0.1x0.1x0.1 N/A N/A Measurements Reese x W x D (cm) 0.008 N/A N/A A (cm) : rea 0.001 N/A N/A Volume (cm) : 99.80% N/A N/A % Reduction in Area: 99.70% N/A N/A % Reduction in Volume: Full Thickness Without Exposed N/A N/A Classification: Support Structures Medium N/A N/A Exudate Amount: Serosanguineous N/A N/A Exudate Type: red, brown N/A N/A Exudate Color: Distinct,  outline attached N/A N/A Wound Margin: Large (67-100%) N/A N/A Granulation Amount: Red, Pink N/A N/A Granulation Quality: Small (1-33%) N/A N/A Necrotic Amount: Fat Layer (Subcutaneous Tissue): Yes N/A N/A Exposed Structures: Fascia: No Tendon: No Muscle: No Joint: No Bone: No Large (67-100%) N/A N/A Epithelialization: Scarring: Yes N/A N/A Periwound Skin Texture: Excoriation: No Induration: No Callus: No Crepitus: No Rash: No Maceration: No N/A N/A Periwound Skin Moisture: Dry/Scaly: No Hemosiderin Staining: Yes N/A N/A Periwound Skin Color: Atrophie Blanche: No Cyanosis: No Ecchymosis: No Erythema: No Mottled: No Pallor: No Rubor: No No Abnormality N/A N/A Temperature: Mastrogiovanni, [name] ([id number]) [id number] Page 4 of 7 Compression Therapy N/A N/A Procedures Performed: Treatment Notes Electronic Signature(s) Signed: 07/12/2023 12:36:28 PM By: Geralyn Corwin DO Entered By: Geralyn Corwin on 07/12/2023 11:48:34 -------------------------------------------------------------------------------- Multi-Disciplinary Care Plan Details Patient Name: Date of Service: [name], [name]. 07/12/2023 11:00 A M Medical Record Number: [id number] Patient Account Number: [id number] Date of Birth/Sex: Treating RN: [birth year] (72 y.o. [name] Primary Care Journie Howson: [name] NDI Other Clinician: Referring Raghav Verrilli: Treating Lorenso Quirino/Extender: Geralyn Corwin HA RRIS, BRA NDI Weeks in Treatment: 7 Active Inactive Wound/Skin Impairment Nursing Diagnoses: Knowledge deficit related to smoking impact on wound healing Goals: Patient/caregiver will verbalize understanding of skin care regimen Date Initiated: 05/24/2023 Target Resolution Date: 07/26/2023 Goal Status: Active Interventions: Assess patient/caregiver ability to obtain necessary supplies Assess patient/caregiver ability to perform ulcer/skin care regimen upon admission and as  needed Assess ulceration(s) every visit Provide education on ulcer and skin care Screen for HBO Treatment Activities: Skin care regimen initiated : 05/24/2023 Topical wound management initiated : 05/24/2023 Notes: Electronic Signature(s) Signed: 07/12/2023 6:21:10 PM By: Shawn Stall RN, BSN Entered By: Shawn Stall on 07/12/2023 11:48:56 -------------------------------------------------------------------------------- Pain Assessment Details Patient Name: Date of Service: [name], [name]. 07/12/2023 11:00 A M Medical Record Number: [id number] Patient Account Number: [id number] Date of Birth/Sex: Treating RN: Apr 23, 1951 (72 y.o. M) Primary Care Zamoria Boss: [name] NDI Other Clinician: Referring Edris Schneck: Treating Charman Blasco/Extender: Cannon Kettle, BRA NDI Weeks in Treatment: 7 Active Problems [name], [name] ([id number]) [id number] Page 5 of 7 Location of Pain Severity and Description of Pain Patient Has Paino No Site Locations Pain Management and Medication Current Pain Management: Electronic Signature(s) Signed: 07/12/2023 4:56:42 PM By: Thayer Dallas Entered By: Thayer Dallas on 07/12/2023 11:24:45 -------------------------------------------------------------------------------- Patient/Caregiver Education Details Patient Name: Date of Service: [name], Allen  [name]. 7/30/2024andnbsp11:00 A M Medical Record Number: [id number] Patient Account Number: [id number] Date of Birth/Gender: Treating RN: [birth year] (72 y.o. [name] Primary Care Physician: [name] NDI Other Clinician: Referring Physician: Treating Physician/Extender: Cannon Kettle, BRA NDI Weeks in Treatment: 7 Education Assessment Education Provided To: Patient Education Topics Provided Wound/Skin Impairment: Handouts: Caring for Your Ulcer Methods: Explain/Verbal Responses: Reinforcements needed Electronic Signature(s) Signed: 07/12/2023  6:21:10 PM By: Shawn Stall RN, BSN Entered By: Shawn Stall on 07/12/2023 11:49:13 Wound Assessment Details -------------------------------------------------------------------------------- [name] ([id number]) [id number] Page 6 of 7 Patient Name: Date of Service: Allen Holster [name]. 07/12/2023 11:00 A M Medical Record Number: [id number] Patient Account Number: [id number] Date of Birth/Sex: Treating RN: 08-25-1951 (72 y.o. M) Primary Care Blake Vetrano: [name] NDI Other Clinician: Referring Kaivon Livesey: Treating Antrice Pal/Extender: Geralyn Corwin HA RRIS, BRA NDI Weeks in Treatment: 7 Wound Status Wound Number: 16 Primary Lymphedema Etiology: Wound Location: Right Lower Leg Wound Open Wounding Event: Gradually Appeared Status: Date Acquired: 06/14/2023 Comorbid Cataracts, Lymphedema, Arrhythmia, Peripheral Venous Disease, Weeks Of Treatment: 3 History: Type II Diabetes, Osteoarthritis, Neuropathy Clustered Wound: No Photos Wound Measurements Length: (cm) 0.1 % Reduction in Area: 99.8% Width: (cm) 0.1 % Reduction in Volume: 99.7% Depth: (cm) 0.1 Epithelialization: Large (67-100%) Area: (cm) 0.008 Tunneling: No Volume: (cm) 0.001 Undermining: No Wound Description Classification: Full Thickness Without Exposed Support Structures Foul Odor After Cleansing: No Wound Margin: Distinct, outline attached Slough/Fibrino No Exudate Amount: Medium Exudate Type: Serosanguineous Exudate Color: red, brown Wound Bed Granulation Amount: Large (67-100%) Exposed Structure Granulation Quality: Red, Pink Fascia Exposed: No Necrotic Amount: Small (1-33%) Fat Layer (Subcutaneous Tissue) Exposed: Yes Tendon Exposed: No Muscle Exposed: No Joint Exposed: No Bone Exposed: No Periwound Skin Texture Texture Color No Abnormalities Noted: No No Abnormalities Noted: No Callus: No Atrophie Blanche: No Crepitus: No Cyanosis: No Excoriation:  No Ecchymosis: No Induration: No Erythema: No Rash: No Hemosiderin Staining: Yes Scarring: Yes Mottled: No Pallor: No Moisture Rubor: No No Abnormalities Noted: No Dry / Scaly: No Temperature / Pain Maceration: No Temperature: No Abnormality Treatment Notes Wound #16 (Lower Leg) Wound Laterality: Right Cleanser Soap and Water Discharge Instruction: May shower and wash wound with dial antibacterial soap and water prior to dressing change. [name], [name] ([id number]) [id number] Page 7 of 7 Wound Cleanser Discharge Instruction: Cleanse the wound with wound cleanser prior to applying a clean dressing using gauze sponges, not tissue or cotton balls. Peri-Wound Care Triamcinolone 15 (g) Discharge Instruction: Use triamcinolone 15 (g) as directed Sween Lotion (Moisturizing lotion) Discharge Instruction: Apply moisturizing lotion as directed Topical Gentamicin Discharge Instruction: mix with mupirocin. ****IN CLINIC ONLY*** Mupirocin Ointment Discharge Instruction: Apply Mupirocin (Bactroban) as instructed. MIX WITH GENTAMICIN. ****IN CLINIC ONLY.*** Primary Dressing Maxorb Extra Ag+ Alginate Dressing, 4x4.75 (in/in) Discharge Instruction: Apply to wound bed as instructed Secondary Dressing ABD Pad, 8x10 Discharge Instruction: Apply over primary dressing as directed. Secured With Compression Wrap Urgo K2 Lite, (equivalent to a 3 layer) two layer compression system, regular Discharge Instruction: Apply Urgo K2 Lite as directed (alternative to 3 layer compression). unna boot first layer of upper portion of lower leg. Compression Stockings Add-Ons Electronic Signature(s) Signed: 07/12/2023 4:56:42 PM By: Thayer Dallas Entered By: Thayer Dallas on 07/12/2023 11:40:11 -------------------------------------------------------------------------------- Vitals Details Patient Name: Date of Service: [name], [name]. 07/12/2023 11:00 A M Medical Record  Number: [id number] Patient Account Number: [id number] Date of Birth/Sex: Treating RN: 1951/02/03 (72 y.o. M) Primary  Care Endia Moncur: [name] NDI Other Clinician: Referring Christe Tellez: Treating Aking Klabunde/Extender: Geralyn Corwin HA RRIS, BRA NDI Weeks in Treatment: 7 Vital Signs Time Taken: 11:26 Temperature (F): 98.3 Pulse (bpm): 73 Respiratory Rate (breaths/min): 18 Blood Pressure (mmHg): 132/66 Reference Range: 80 - 120 mg / dl Electronic Signature(s) Signed: 07/12/2023 4:56:42 PM By: Thayer Dallas Entered By: Thayer Dallas on 07/12/2023 11:26:41

## 2023-07-14 ENCOUNTER — Telehealth: Payer: Self-pay | Admitting: Family

## 2023-07-14 NOTE — Telephone Encounter (Signed)
Returned call to office,   She states she needs an order for blood sugar management due to his sugars being quite elevated in the morning. Advised her that we do not manage his blood sugars and they would need to reach out to endocrinology or PCP.

## 2023-07-14 NOTE — Telephone Encounter (Signed)
Office is requesting a callback regarding a few questions regarding the pt and would like to speak with MD or nurse. Please advise

## 2023-07-19 ENCOUNTER — Encounter (HOSPITAL_BASED_OUTPATIENT_CLINIC_OR_DEPARTMENT_OTHER): Payer: 59 | Attending: Internal Medicine | Admitting: Internal Medicine

## 2023-07-19 DIAGNOSIS — Z87891 Personal history of nicotine dependence: Secondary | ICD-10-CM | POA: Insufficient documentation

## 2023-07-19 DIAGNOSIS — X58XXXA Exposure to other specified factors, initial encounter: Secondary | ICD-10-CM | POA: Diagnosis not present

## 2023-07-19 DIAGNOSIS — I890 Lymphedema, not elsewhere classified: Secondary | ICD-10-CM | POA: Insufficient documentation

## 2023-07-19 DIAGNOSIS — Z89512 Acquired absence of left leg below knee: Secondary | ICD-10-CM | POA: Insufficient documentation

## 2023-07-19 DIAGNOSIS — I129 Hypertensive chronic kidney disease with stage 1 through stage 4 chronic kidney disease, or unspecified chronic kidney disease: Secondary | ICD-10-CM | POA: Insufficient documentation

## 2023-07-19 DIAGNOSIS — Z79899 Other long term (current) drug therapy: Secondary | ICD-10-CM | POA: Insufficient documentation

## 2023-07-19 DIAGNOSIS — S81801A Unspecified open wound, right lower leg, initial encounter: Secondary | ICD-10-CM | POA: Diagnosis not present

## 2023-07-19 DIAGNOSIS — N183 Chronic kidney disease, stage 3 unspecified: Secondary | ICD-10-CM | POA: Insufficient documentation

## 2023-07-19 DIAGNOSIS — E1122 Type 2 diabetes mellitus with diabetic chronic kidney disease: Secondary | ICD-10-CM | POA: Diagnosis not present

## 2023-07-19 DIAGNOSIS — E11622 Type 2 diabetes mellitus with other skin ulcer: Secondary | ICD-10-CM | POA: Insufficient documentation

## 2023-07-19 DIAGNOSIS — I87311 Chronic venous hypertension (idiopathic) with ulcer of right lower extremity: Secondary | ICD-10-CM | POA: Diagnosis not present

## 2023-07-19 NOTE — Progress Notes (Addendum)
a shrinker on and when taken off it peeled back the skin. He has been healing however has 2 open areas still present. He has been keeping the area covered. He also reports open wounds to the right lower extremity but these have healed with Ace wraps. He does have juxta lite compression Velcro wraps at home. He has home health that changes his dressings. He currently denies signs of infection. 6/18; patient presents for follow-up. We have been using Hydrofera Blue to the wound beds under Tubigrip. Wounds are smaller. 7/8; patient presents for follow-up. We have been using Hydrofera Blue to the wound beds on the left stump under Tubigrip. His wounds of healed. He did have some areas of irritation where the tape pulled. He has developed a new wound to the right lower extremity. He has been sleeping in his chair. He developed more swelling and how has a new wound. 7/19; patient presents for follow-up.  We have been using collagen under 3 layer compression to the right lower extremity. He has had some irritation from the wrap and now has a new wound to the posterior right leg. 7/30; patient presents for follow-up. We have been using silver alginate under 3 layer compression to the right lower extremity wounds. He has 1 wound remaining to the posterior aspect. 8/6; patient presents for follow-up. We have been using antibiotic ointment with silver alginate under 3 layer compression to the right lower extremity. His wound is healed. He has no open wounds to the left stump. He has his juxta lite compression wraps with him today. [name], [name] ([id number]) [id number] Page 3 of 9 Electronic Signature(s) Signed: 07/19/2023 5:13:05 PM By: Geralyn Corwin DO Entered By: Geralyn Corwin on 07/19/2023 13:54:02 -------------------------------------------------------------------------------- Physical Exam Details Patient Name: Date of Service: [name], [name]. 07/19/2023 1:15 PM Medical Record Number: [id number] Patient Account Number: [id number] Date of Birth/Sex: Treating RN: [birth year] (72 y.o. M) Primary Care Provider: [name] NDI Other Clinician: Referring Provider: Treating Provider/Extender: Geralyn Corwin HA RRIS, BRA NDI Weeks in Treatment: 8 Constitutional respirations regular, non-labored and within target range for patient.. Cardiovascular 2+ dorsalis pedis/posterior tibialis pulses. Psychiatric pleasant and cooperative. Notes Right lower extremity: Venous stasis dermatitis. Epithelialization to the previous wound sites. Excellent edema control. No signs of active surrounding soft tissue infection. No open wounds noted to the left stump Electronic Signature(s) Signed: 07/19/2023 5:13:05 PM By: Geralyn Corwin DO Entered By: Geralyn Corwin on 07/19/2023  13:54:47 -------------------------------------------------------------------------------- Physician Orders Details Patient Name: Date of Service: [name], [name]. 07/19/2023 1:15 PM Medical Record Number: [id number] Patient Account Number: [id number] Date of Birth/Sex: Treating RN: 08-May-1951 (72 y.o. [name] Primary Care Provider: [name] NDI Other Clinician: Referring Provider: Treating Provider/Extender: Geralyn Corwin HA RRIS, BRA NDI Weeks in Treatment: 8 Verbal / Phone Orders: No Diagnosis Coding Discharge From [address] [facility] Services Discharge from [facility] - Congratulations!! Edema Control - Lymphedema / SCD / Other Bilateral Lower Extremities Lymphedema Pumps. Use Lymphedema pumps on leg(s) 2-3 times a day for 45-60 minutes. If wearing any wraps or hose, do not remove them. Continue exercising as instructed. - LymphaCare pumps. Once it arrives start using x2 times a day for an hour each time. Compression stocking or Garment 20-30 mm/Hg pressure to: - has juxtalite HD for right leg. Other Edema Control Orders/Instructions: - tubigrip to size E to left stump. Electronic Signature(s) Signed: 08/17/2023 10:32:45 AM By: Geralyn Corwin DO Signed: 08/26/2023 8:36:37 AM By:  [name], [name] ([id number]) [id number] Page 1 of 9 Visit Report for 07/19/2023 Chief Complaint Document Details Patient Name: Date of Service: [name] 07/19/2023 1:15 PM Medical Record Number: [id number] Patient Account Number: [id number] Date of Birth/Sex: Treating RN: 04/20/1951 (72 y.o. M) Primary Care Provider: [name] NDI Other Clinician: Referring Provider: Treating Provider/Extender: Cannon Kettle, BRA NDI Weeks in Treatment: 8 Information Obtained from: Patient Chief Complaint 02/03/2022: The patient is here today for reevaluation of bilateral lower extremity edema and venous stasis dermatitis Electronic Signature(s) Signed: 07/19/2023 5:13:05 PM By: Geralyn Corwin DO Entered By: Geralyn Corwin on 07/19/2023 13:53:22 -------------------------------------------------------------------------------- HPI Details Patient Name: Date of Service: [name], [name]. 07/19/2023 1:15 PM Medical Record Number: [id number] Patient Account Number: [id number] Date of Birth/Sex: Treating RN: [birth year] (72 y.o. M) Primary Care Provider: [name] NDI Other Clinician: Referring Provider: Treating Provider/Extender: Geralyn Corwin HA RRIS, BRA NDI Weeks in Treatment: 8 History of Present Illness HPI Description: [name] is a 72 year old male with a past medical history of type 2 diabetes and essential hypertension who presents to the clinic today for bilateral lower extremity wounds. He was recently seen in the ED on 3/9 for bilateral lower extremity edema. He had a DVT study that was negative for clots. He was given triamcinolone cream and advised to elevate legs and obtain compression stockings. He has been using the triamcinolone cream however he is unable to use compression stockings due to difficulty of putting on. Patient states that for the past 2 years he has had opening and closing of weeping wounds to his legs  bilaterally. They spontaneously open and close. They are often open for several weeks before they heal spontaneously. He denies any purulent drainage, increased warmth or increased erythema to the skin. 4/21; patient presents for 1 week follow-up. The wounds on the right leg have healed however he has now 2 new wounds 1 on the anterior and posterior thigh that occurred from him scratching underneath the wrap. The left leg wound has improved and almost closed 4/28; patient presents for 1 week follow-up. He continues to have a wound on the right anterior leg that is showing signs of healing. The posterior right thigh has closed. He now has a new wound to the left leg that is limited to skin breakdown. 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 The wounds are occurring because he goes underneath the leg wraps and scratches creating new wounds. He has no complaints today. 5/5; patient presents for 1 week follow-up. He no longer has any open wounds to his lower extremities bilaterally. He continues to have scattered excoriation marks from where he scratches. These are scabbed over. He states he is going to see dermatology this month. Overall he is doing well and is happy with his care. Readmission 6/10 Patient was followed for open wounds limited to skin breakdown to his lower extremities bilaterally 2/2 venous insufficiency. Once the wounds healed he was discharged with juxta lite compressions and these were placed in office. He was discharged on 5/5. He states that he has not taken the wraps off since discharge and has had them on for over a month. He did not want to take them off because they were very comfortable. He does not have any difficulty putting these on and off if he wanted to. He states that he does not take a shower  [name], [name] ([id number]) [id number] Page 1 of 9 Visit Report for 07/19/2023 Chief Complaint Document Details Patient Name: Date of Service: [name] 07/19/2023 1:15 PM Medical Record Number: [id number] Patient Account Number: [id number] Date of Birth/Sex: Treating RN: 04/20/1951 (72 y.o. M) Primary Care Provider: [name] NDI Other Clinician: Referring Provider: Treating Provider/Extender: Cannon Kettle, BRA NDI Weeks in Treatment: 8 Information Obtained from: Patient Chief Complaint 02/03/2022: The patient is here today for reevaluation of bilateral lower extremity edema and venous stasis dermatitis Electronic Signature(s) Signed: 07/19/2023 5:13:05 PM By: Geralyn Corwin DO Entered By: Geralyn Corwin on 07/19/2023 13:53:22 -------------------------------------------------------------------------------- HPI Details Patient Name: Date of Service: [name], [name]. 07/19/2023 1:15 PM Medical Record Number: [id number] Patient Account Number: [id number] Date of Birth/Sex: Treating RN: [birth year] (72 y.o. M) Primary Care Provider: [name] NDI Other Clinician: Referring Provider: Treating Provider/Extender: Geralyn Corwin HA RRIS, BRA NDI Weeks in Treatment: 8 History of Present Illness HPI Description: [name] is a 72 year old male with a past medical history of type 2 diabetes and essential hypertension who presents to the clinic today for bilateral lower extremity wounds. He was recently seen in the ED on 3/9 for bilateral lower extremity edema. He had a DVT study that was negative for clots. He was given triamcinolone cream and advised to elevate legs and obtain compression stockings. He has been using the triamcinolone cream however he is unable to use compression stockings due to difficulty of putting on. Patient states that for the past 2 years he has had opening and closing of weeping wounds to his legs  bilaterally. They spontaneously open and close. They are often open for several weeks before they heal spontaneously. He denies any purulent drainage, increased warmth or increased erythema to the skin. 4/21; patient presents for 1 week follow-up. The wounds on the right leg have healed however he has now 2 new wounds 1 on the anterior and posterior thigh that occurred from him scratching underneath the wrap. The left leg wound has improved and almost closed 4/28; patient presents for 1 week follow-up. He continues to have a wound on the right anterior leg that is showing signs of healing. The posterior right thigh has closed. He now has a new wound to the left leg that is limited to skin breakdown. 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 The wounds are occurring because he goes underneath the leg wraps and scratches creating new wounds. He has no complaints today. 5/5; patient presents for 1 week follow-up. He no longer has any open wounds to his lower extremities bilaterally. He continues to have scattered excoriation marks from where he scratches. These are scabbed over. He states he is going to see dermatology this month. Overall he is doing well and is happy with his care. Readmission 6/10 Patient was followed for open wounds limited to skin breakdown to his lower extremities bilaterally 2/2 venous insufficiency. Once the wounds healed he was discharged with juxta lite compressions and these were placed in office. He was discharged on 5/5. He states that he has not taken the wraps off since discharge and has had them on for over a month. He did not want to take them off because they were very comfortable. He does not have any difficulty putting these on and off if he wanted to. He states that he does not take a shower  Shawn Stall RN, BSN Previous Signature: 07/19/2023 5:13:05 PM Version By: Geralyn Corwin DO Entered By: Shawn Stall on 08/17/2023 08:05:34 [name] ([id number]) [id number] Page 4 of 9 -------------------------------------------------------------------------------- Problem List Details Patient Name: Date of Service: Allen Holster [name]. 07/19/2023 1:15 PM Medical Record Number: [id number] Patient Account Number: [id number] Date of Birth/Sex: Treating RN: 05-26-1951 (72 y.o. 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 07/01/2023 No Yes I89.0 Lymphedema, not elsewhere classified 05/24/2023 No Yes E11.622 Type 2 diabetes mellitus with other skin ulcer 05/24/2023 No Yes Z89.512 Acquired absence of left leg below knee 05/24/2023 No Yes Inactive Problems Resolved Problems ICD-10 Code Description Active Date Resolved Date L97.822 Non-pressure chronic ulcer of other part of left lower leg with fat layer exposed 05/24/2023 05/24/2023 Electronic Signature(s) Signed: 07/19/2023 5:13:05 PM By: Geralyn Corwin DO Entered By: Geralyn Corwin on 07/19/2023 13:53:07 -------------------------------------------------------------------------------- Progress Note Details Patient Name: Date of Service: [name], [name]. 07/19/2023 1:15 PM Medical Record Number: [id number] Patient Account Number: [id number] Date of Birth/Sex: Treating RN: 03-14-1951 (72 y.o. M) Primary Care Provider: [name] NDI Other Clinician: Referring Provider: Treating Provider/Extender: Cannon Kettle, BRA NDI Weeks in Treatment: 8 [name], [name] ([id number]) [id number] Page 5 of 9 Subjective Chief Complaint Information obtained from Patient 02/03/2022: The patient is here today for reevaluation of bilateral lower extremity edema and venous stasis dermatitis History of Present Illness (HPI) [place] is a 72 year old male with a past medical history of type 2 diabetes and essential hypertension who presents to the clinic today for bilateral lower extremity wounds. He was recently seen in the ED on 3/9 for bilateral lower extremity edema. He had a DVT study that was negative for clots. He was given triamcinolone cream and advised to elevate legs and obtain  compression stockings. He has been using the triamcinolone cream however he is unable to use compression stockings due to difficulty of putting on. Patient states that for the past 2 years he has had opening and closing of weeping wounds to his legs bilaterally. They spontaneously open and close. They are often open for several weeks before they heal spontaneously. He denies any purulent drainage, increased warmth or increased erythema to the skin. 4/21; patient presents for 1 week follow-up. The wounds on the right leg have healed however he has now 2 new wounds 1 on the anterior and posterior thigh that occurred from him scratching underneath the wrap. The left leg wound has improved and almost closed 4/28; patient presents for 1 week follow-up. He continues to have a wound on the right anterior leg that is showing signs of healing. The posterior right thigh has closed. He now has a new wound to the left leg that is limited to skin breakdown. 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 The wounds are occurring because he goes underneath the leg wraps and scratches creating new wounds. He has no complaints today. 5/5; patient presents for 1 week follow-up. He no longer has any open wounds to his lower extremities bilaterally. He continues to have scattered excoriation marks from where he scratches. These are  [name], [name] ([id number]) [id number] Page 1 of 9 Visit Report for 07/19/2023 Chief Complaint Document Details Patient Name: Date of Service: [name] 07/19/2023 1:15 PM Medical Record Number: [id number] Patient Account Number: [id number] Date of Birth/Sex: Treating RN: 04/20/1951 (72 y.o. M) Primary Care Provider: [name] NDI Other Clinician: Referring Provider: Treating Provider/Extender: Cannon Kettle, BRA NDI Weeks in Treatment: 8 Information Obtained from: Patient Chief Complaint 02/03/2022: The patient is here today for reevaluation of bilateral lower extremity edema and venous stasis dermatitis Electronic Signature(s) Signed: 07/19/2023 5:13:05 PM By: Geralyn Corwin DO Entered By: Geralyn Corwin on 07/19/2023 13:53:22 -------------------------------------------------------------------------------- HPI Details Patient Name: Date of Service: [name], [name]. 07/19/2023 1:15 PM Medical Record Number: [id number] Patient Account Number: [id number] Date of Birth/Sex: Treating RN: [birth year] (72 y.o. M) Primary Care Provider: [name] NDI Other Clinician: Referring Provider: Treating Provider/Extender: Geralyn Corwin HA RRIS, BRA NDI Weeks in Treatment: 8 History of Present Illness HPI Description: [name] is a 72 year old male with a past medical history of type 2 diabetes and essential hypertension who presents to the clinic today for bilateral lower extremity wounds. He was recently seen in the ED on 3/9 for bilateral lower extremity edema. He had a DVT study that was negative for clots. He was given triamcinolone cream and advised to elevate legs and obtain compression stockings. He has been using the triamcinolone cream however he is unable to use compression stockings due to difficulty of putting on. Patient states that for the past 2 years he has had opening and closing of weeping wounds to his legs  bilaterally. They spontaneously open and close. They are often open for several weeks before they heal spontaneously. He denies any purulent drainage, increased warmth or increased erythema to the skin. 4/21; patient presents for 1 week follow-up. The wounds on the right leg have healed however he has now 2 new wounds 1 on the anterior and posterior thigh that occurred from him scratching underneath the wrap. The left leg wound has improved and almost closed 4/28; patient presents for 1 week follow-up. He continues to have a wound on the right anterior leg that is showing signs of healing. The posterior right thigh has closed. He now has a new wound to the left leg that is limited to skin breakdown. 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 The wounds are occurring because he goes underneath the leg wraps and scratches creating new wounds. He has no complaints today. 5/5; patient presents for 1 week follow-up. He no longer has any open wounds to his lower extremities bilaterally. He continues to have scattered excoriation marks from where he scratches. These are scabbed over. He states he is going to see dermatology this month. Overall he is doing well and is happy with his care. Readmission 6/10 Patient was followed for open wounds limited to skin breakdown to his lower extremities bilaterally 2/2 venous insufficiency. Once the wounds healed he was discharged with juxta lite compressions and these were placed in office. He was discharged on 5/5. He states that he has not taken the wraps off since discharge and has had them on for over a month. He did not want to take them off because they were very comfortable. He does not have any difficulty putting these on and off if he wanted to. He states that he does not take a shower  Shawn Stall RN, BSN Previous Signature: 07/19/2023 5:13:05 PM Version By: Geralyn Corwin DO Entered By: Shawn Stall on 08/17/2023 08:05:34 [name] ([id number]) [id number] Page 4 of 9 -------------------------------------------------------------------------------- Problem List Details Patient Name: Date of Service: Allen Holster [name]. 07/19/2023 1:15 PM Medical Record Number: [id number] Patient Account Number: [id number] Date of Birth/Sex: Treating RN: 05-26-1951 (72 y.o. 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 07/01/2023 No Yes I89.0 Lymphedema, not elsewhere classified 05/24/2023 No Yes E11.622 Type 2 diabetes mellitus with other skin ulcer 05/24/2023 No Yes Z89.512 Acquired absence of left leg below knee 05/24/2023 No Yes Inactive Problems Resolved Problems ICD-10 Code Description Active Date Resolved Date L97.822 Non-pressure chronic ulcer of other part of left lower leg with fat layer exposed 05/24/2023 05/24/2023 Electronic Signature(s) Signed: 07/19/2023 5:13:05 PM By: Geralyn Corwin DO Entered By: Geralyn Corwin on 07/19/2023 13:53:07 -------------------------------------------------------------------------------- Progress Note Details Patient Name: Date of Service: [name], [name]. 07/19/2023 1:15 PM Medical Record Number: [id number] Patient Account Number: [id number] Date of Birth/Sex: Treating RN: 03-14-1951 (72 y.o. M) Primary Care Provider: [name] NDI Other Clinician: Referring Provider: Treating Provider/Extender: Cannon Kettle, BRA NDI Weeks in Treatment: 8 [name], [name] ([id number]) [id number] Page 5 of 9 Subjective Chief Complaint Information obtained from Patient 02/03/2022: The patient is here today for reevaluation of bilateral lower extremity edema and venous stasis dermatitis History of Present Illness (HPI) [place] is a 72 year old male with a past medical history of type 2 diabetes and essential hypertension who presents to the clinic today for bilateral lower extremity wounds. He was recently seen in the ED on 3/9 for bilateral lower extremity edema. He had a DVT study that was negative for clots. He was given triamcinolone cream and advised to elevate legs and obtain  compression stockings. He has been using the triamcinolone cream however he is unable to use compression stockings due to difficulty of putting on. Patient states that for the past 2 years he has had opening and closing of weeping wounds to his legs bilaterally. They spontaneously open and close. They are often open for several weeks before they heal spontaneously. He denies any purulent drainage, increased warmth or increased erythema to the skin. 4/21; patient presents for 1 week follow-up. The wounds on the right leg have healed however he has now 2 new wounds 1 on the anterior and posterior thigh that occurred from him scratching underneath the wrap. The left leg wound has improved and almost closed 4/28; patient presents for 1 week follow-up. He continues to have a wound on the right anterior leg that is showing signs of healing. The posterior right thigh has closed. He now has a new wound to the left leg that is limited to skin breakdown. 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 The wounds are occurring because he goes underneath the leg wraps and scratches creating new wounds. He has no complaints today. 5/5; patient presents for 1 week follow-up. He no longer has any open wounds to his lower extremities bilaterally. He continues to have scattered excoriation marks from where he scratches. These are  pulses. Psychiatric pleasant and cooperative. General Notes: Right lower extremity: Venous stasis dermatitis. Epithelialization to the previous wound sites. Excellent edema control. No signs of active surrounding soft tissue infection. No open wounds noted to the left stump Integumentary (Hair, Skin) Wound #16 status is Open. Original cause of wound was Gradually Appeared. The date acquired was: 06/14/2023. The wound has been in treatment 4 weeks. The wound is located on the Right Lower Leg. The wound measures 0cm length x 0cm width x 0cm depth; 0cm^2 area and 0cm^3 volume. There is Fat Layer (Subcutaneous Tissue) exposed. There is no tunneling or undermining noted. There is a none present amount of drainage noted. The wound margin is distinct with the outline attached to the wound base. There is no granulation within the wound bed. There is no necrotic tissue within the wound bed. The periwound skin appearance exhibited: Scarring, Dry/Scaly, Hemosiderin Staining. The periwound skin appearance did not exhibit: Callus, Crepitus, Excoriation, Induration, Rash, Maceration, Atrophie Allen, Cyanosis, Ecchymosis, Mottled, Pallor, Rubor, Erythema. Periwound temperature was noted as No Abnormality. Assessment Active Problems ICD-10 Unspecified open wound, right lower leg, initial  encounter Chronic venous hypertension (idiopathic) with ulcer of right lower extremity Lymphedema, not elsewhere classified Type 2 diabetes mellitus with other skin ulcer Acquired absence of left leg below knee Patient has done well with silver alginate and antibiotic ointment under compression therapy. His wound has healed. I recommended wearing compression garments daily. Follow-up as needed. Plan Discharge From [facility] Services: Discharge from Wound Care Center - Congratulations!! Edema Control - Lymphedema / SCD / Other: Lymphedema Pumps. Use Lymphedema pumps on leg(s) 2-3 times a day for 45-60 minutes. If wearing any wraps or hose, do not remove them. Continue exercising as instructed. - LymphaCare pumps. Once it arrives start using x2 times a day for an hour each time. Compression stocking or Garment 20-30 mm/Hg pressure to: - has juxtalite HD for right leg. Other Edema Control Orders/Instructions: - tubigrip to size E to left stump. 1. Velcro compression wrap daily 2. Discharge from clinic due to closed wound 3. Follow-up as needed Electronic Signature(s) Signed: 08/17/2023 10:32:45 AM By: Geralyn Corwin DO Signed: 08/26/2023 8:36:37 AM By: Shawn Stall RN, BSN Previous Signature: 07/19/2023 5:13:05 PM Version By: Geralyn Corwin DO Entered By: Shawn Stall on 08/17/2023 08:05:46 -------------------------------------------------------------------------------- HxROS Details Patient Name: Date of Service: [name], [name]. 07/19/2023 1:15 PM [name], [name] ([id number]) [id number] Page 8 of 9 Medical Record Number: [id number] Patient Account Number: [id number] Date of Birth/Sex: Treating RN: [birth year] (72 y.o. M) Primary Care Provider: [name] NDI Other Clinician: Referring Provider: Treating Provider/Extender: Geralyn Corwin HA RRIS, BRA NDI Weeks in Treatment: 8 Unable to Obtain Patient History due to Altered Mental Status Information  Obtained From Patient Eyes Medical History: Positive for: Cataracts - 2014 Hematologic/Lymphatic Medical History: Positive for: Lymphedema Past Medical History Notes: Hyperbilirubinemia Cardiovascular Medical History: Positive for: Arrhythmia - A-Fib; Peripheral Venous Disease Gastrointestinal Medical History: Past Medical History Notes: Rectal Pain-Constipation Endocrine Medical History: Positive for: Type II Diabetes Time with diabetes: Since 2014 Treated with: Oral agents Blood sugar tested every day: Yes Tested : Genitourinary Medical History: Past Medical History Notes: CKD stage 3-BPH Musculoskeletal Medical History: Positive for: Osteoarthritis Neurologic Medical History: Positive for: Neuropathy Negative for: Quadriplegia; Paraplegia; Seizure Disorder Oncologic Medical History: Negative for: Received Chemotherapy; Received Radiation Past Medical History Notes: Colon Cancer-Colon Re-section Psychiatric Medical History: Negative for: Anorexia/bulimia; Confinement Anxiety HBO Extended History Items Eyes: Cataracts Immunizations [name], [name]  a shrinker on and when taken off it peeled back the skin. He has been healing however has 2 open areas still present. He has been keeping the area covered. He also reports open wounds to the right lower extremity but these have healed with Ace wraps. He does have juxta lite compression Velcro wraps at home. He has home health that changes his dressings. He currently denies signs of infection. 6/18; patient presents for follow-up. We have been using Hydrofera Blue to the wound beds under Tubigrip. Wounds are smaller. 7/8; patient presents for follow-up. We have been using Hydrofera Blue to the wound beds on the left stump under Tubigrip. His wounds of healed. He did have some areas of irritation where the tape pulled. He has developed a new wound to the right lower extremity. He has been sleeping in his chair. He developed more swelling and how has a new wound. 7/19; patient presents for follow-up.  We have been using collagen under 3 layer compression to the right lower extremity. He has had some irritation from the wrap and now has a new wound to the posterior right leg. 7/30; patient presents for follow-up. We have been using silver alginate under 3 layer compression to the right lower extremity wounds. He has 1 wound remaining to the posterior aspect. 8/6; patient presents for follow-up. We have been using antibiotic ointment with silver alginate under 3 layer compression to the right lower extremity. His wound is healed. He has no open wounds to the left stump. He has his juxta lite compression wraps with him today. [name], [name] ([id number]) [id number] Page 3 of 9 Electronic Signature(s) Signed: 07/19/2023 5:13:05 PM By: Geralyn Corwin DO Entered By: Geralyn Corwin on 07/19/2023 13:54:02 -------------------------------------------------------------------------------- Physical Exam Details Patient Name: Date of Service: [name], [name]. 07/19/2023 1:15 PM Medical Record Number: [id number] Patient Account Number: [id number] Date of Birth/Sex: Treating RN: [birth year] (72 y.o. M) Primary Care Provider: [name] NDI Other Clinician: Referring Provider: Treating Provider/Extender: Geralyn Corwin HA RRIS, BRA NDI Weeks in Treatment: 8 Constitutional respirations regular, non-labored and within target range for patient.. Cardiovascular 2+ dorsalis pedis/posterior tibialis pulses. Psychiatric pleasant and cooperative. Notes Right lower extremity: Venous stasis dermatitis. Epithelialization to the previous wound sites. Excellent edema control. No signs of active surrounding soft tissue infection. No open wounds noted to the left stump Electronic Signature(s) Signed: 07/19/2023 5:13:05 PM By: Geralyn Corwin DO Entered By: Geralyn Corwin on 07/19/2023  13:54:47 -------------------------------------------------------------------------------- Physician Orders Details Patient Name: Date of Service: [name], [name]. 07/19/2023 1:15 PM Medical Record Number: [id number] Patient Account Number: [id number] Date of Birth/Sex: Treating RN: 08-May-1951 (72 y.o. [name] Primary Care Provider: [name] NDI Other Clinician: Referring Provider: Treating Provider/Extender: Geralyn Corwin HA RRIS, BRA NDI Weeks in Treatment: 8 Verbal / Phone Orders: No Diagnosis Coding Discharge From [facility] Services Discharge from Wound Care Center - Congratulations!! Edema Control - Lymphedema / SCD / Other Bilateral Lower Extremities Lymphedema Pumps. Use Lymphedema pumps on leg(s) 2-3 times a day for 45-60 minutes. If wearing any wraps or hose, do not remove them. Continue exercising as instructed. - LymphaCare pumps. Once it arrives start using x2 times a day for an hour each time. Compression stocking or Garment 20-30 mm/Hg pressure to: - has juxtalite HD for right leg. Other Edema Control Orders/Instructions: - tubigrip to size E to left stump. Electronic Signature(s) Signed: 08/17/2023 10:32:45 AM By: Geralyn Corwin DO Signed: 08/26/2023 8:36:37 AM By:  Shawn Stall RN, BSN Previous Signature: 07/19/2023 5:13:05 PM Version By: Geralyn Corwin DO Entered By: Shawn Stall on 08/17/2023 08:05:34 [name] ([id number]) [id number] Page 4 of 9 -------------------------------------------------------------------------------- Problem List Details Patient Name: Date of Service: Allen Holster [name]. 07/19/2023 1:15 PM Medical Record Number: [id number] Patient Account Number: [id number] Date of Birth/Sex: Treating RN: 05-26-1951 (72 y.o. 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 07/01/2023 No Yes I89.0 Lymphedema, not elsewhere classified 05/24/2023 No Yes E11.622 Type 2 diabetes mellitus with other skin ulcer 05/24/2023 No Yes Z89.512 Acquired absence of left leg below knee 05/24/2023 No Yes Inactive Problems Resolved Problems ICD-10 Code Description Active Date Resolved Date L97.822 Non-pressure chronic ulcer of other part of left lower leg with fat layer exposed 05/24/2023 05/24/2023 Electronic Signature(s) Signed: 07/19/2023 5:13:05 PM By: Geralyn Corwin DO Entered By: Geralyn Corwin on 07/19/2023 13:53:07 -------------------------------------------------------------------------------- Progress Note Details Patient Name: Date of Service: [name], [name]. 07/19/2023 1:15 PM Medical Record Number: [id number] Patient Account Number: [id number] Date of Birth/Sex: Treating RN: 03-14-1951 (72 y.o. M) Primary Care Provider: [name] NDI Other Clinician: Referring Provider: Treating Provider/Extender: Cannon Kettle, BRA NDI Weeks in Treatment: 8 [name], [name] ([id number]) [id number] Page 5 of 9 Subjective Chief Complaint Information obtained from Patient 02/03/2022: The patient is here today for reevaluation of bilateral lower extremity edema and venous stasis dermatitis History of Present Illness (HPI) [place] is a 72 year old male with a past medical history of type 2 diabetes and essential hypertension who presents to the clinic today for bilateral lower extremity wounds. He was recently seen in the ED on 3/9 for bilateral lower extremity edema. He had a DVT study that was negative for clots. He was given triamcinolone cream and advised to elevate legs and obtain  compression stockings. He has been using the triamcinolone cream however he is unable to use compression stockings due to difficulty of putting on. Patient states that for the past 2 years he has had opening and closing of weeping wounds to his legs bilaterally. They spontaneously open and close. They are often open for several weeks before they heal spontaneously. He denies any purulent drainage, increased warmth or increased erythema to the skin. 4/21; patient presents for 1 week follow-up. The wounds on the right leg have healed however he has now 2 new wounds 1 on the anterior and posterior thigh that occurred from him scratching underneath the wrap. The left leg wound has improved and almost closed 4/28; patient presents for 1 week follow-up. He continues to have a wound on the right anterior leg that is showing signs of healing. The posterior right thigh has closed. He now has a new wound to the left leg that is limited to skin breakdown. 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 The wounds are occurring because he goes underneath the leg wraps and scratches creating new wounds. He has no complaints today. 5/5; patient presents for 1 week follow-up. He no longer has any open wounds to his lower extremities bilaterally. He continues to have scattered excoriation marks from where he scratches. These are  a shrinker on and when taken off it peeled back the skin. He has been healing however has 2 open areas still present. He has been keeping the area covered. He also reports open wounds to the right lower extremity but these have healed with Ace wraps. He does have juxta lite compression Velcro wraps at home. He has home health that changes his dressings. He currently denies signs of infection. 6/18; patient presents for follow-up. We have been using Hydrofera Blue to the wound beds under Tubigrip. Wounds are smaller. 7/8; patient presents for follow-up. We have been using Hydrofera Blue to the wound beds on the left stump under Tubigrip. His wounds of healed. He did have some areas of irritation where the tape pulled. He has developed a new wound to the right lower extremity. He has been sleeping in his chair. He developed more swelling and how has a new wound. 7/19; patient presents for follow-up.  We have been using collagen under 3 layer compression to the right lower extremity. He has had some irritation from the wrap and now has a new wound to the posterior right leg. 7/30; patient presents for follow-up. We have been using silver alginate under 3 layer compression to the right lower extremity wounds. He has 1 wound remaining to the posterior aspect. 8/6; patient presents for follow-up. We have been using antibiotic ointment with silver alginate under 3 layer compression to the right lower extremity. His wound is healed. He has no open wounds to the left stump. He has his juxta lite compression wraps with him today. [name], [name] ([id number]) [id number] Page 3 of 9 Electronic Signature(s) Signed: 07/19/2023 5:13:05 PM By: Geralyn Corwin DO Entered By: Geralyn Corwin on 07/19/2023 13:54:02 -------------------------------------------------------------------------------- Physical Exam Details Patient Name: Date of Service: [name], [name]. 07/19/2023 1:15 PM Medical Record Number: [id number] Patient Account Number: [id number] Date of Birth/Sex: Treating RN: [birth year] (72 y.o. M) Primary Care Provider: [name] NDI Other Clinician: Referring Provider: Treating Provider/Extender: Geralyn Corwin HA RRIS, BRA NDI Weeks in Treatment: 8 Constitutional respirations regular, non-labored and within target range for patient.. Cardiovascular 2+ dorsalis pedis/posterior tibialis pulses. Psychiatric pleasant and cooperative. Notes Right lower extremity: Venous stasis dermatitis. Epithelialization to the previous wound sites. Excellent edema control. No signs of active surrounding soft tissue infection. No open wounds noted to the left stump Electronic Signature(s) Signed: 07/19/2023 5:13:05 PM By: Geralyn Corwin DO Entered By: Geralyn Corwin on 07/19/2023  13:54:47 -------------------------------------------------------------------------------- Physician Orders Details Patient Name: Date of Service: [name], [name]. 07/19/2023 1:15 PM Medical Record Number: [id number] Patient Account Number: [id number] Date of Birth/Sex: Treating RN: 08-May-1951 (72 y.o. [name] Primary Care Provider: [name] NDI Other Clinician: Referring Provider: Treating Provider/Extender: Geralyn Corwin HA RRIS, BRA NDI Weeks in Treatment: 8 Verbal / Phone Orders: No Diagnosis Coding Discharge From [facility] Services Discharge from Wound Care Center - Congratulations!! Edema Control - Lymphedema / SCD / Other Bilateral Lower Extremities Lymphedema Pumps. Use Lymphedema pumps on leg(s) 2-3 times a day for 45-60 minutes. If wearing any wraps or hose, do not remove them. Continue exercising as instructed. - LymphaCare pumps. Once it arrives start using x2 times a day for an hour each time. Compression stocking or Garment 20-30 mm/Hg pressure to: - has juxtalite HD for right leg. Other Edema Control Orders/Instructions: - tubigrip to size E to left stump. Electronic Signature(s) Signed: 08/17/2023 10:32:45 AM By: Geralyn Corwin DO Signed: 08/26/2023 8:36:37 AM By:

## 2023-07-22 NOTE — Progress Notes (Addendum)
SHAYE, CHUSTZ ([id number]) [phone].pdf Page 1 of 8 Visit Report for 07/19/2023 Arrival Information Details Patient Name: Date of Service: [name] 07/19/2023 1:15 PM Medical Record Number: [id number] Patient Account Number: [id number] Date of Birth/Sex: Treating RN: 1951/06/29 (72 y.o. M) Primary Care Rachel Samples: HA Belinda Block NDI Other Clinician: Referring Dayanara Sherrill: Treating Alie Hardgrove/Extender: Geralyn Corwin HA RRIS, BRA NDI Weeks in Treatment: 8 Visit Information History Since Last Visit Added or deleted any medications: No Patient Arrived: Wheel Chair Any new allergies or adverse reactions: No Arrival Time: 13:25 Had a fall or experienced change in No Accompanied By: self activities of daily living that may affect Transfer Assistance: None risk of falls: Patient Identification Verified: Yes Signs or symptoms of abuse/neglect since last visito No Secondary Verification Process Completed: Yes Hospitalized since last visit: No Patient Requires Transmission-Based Precautions: No Implantable device outside of the clinic excluding No Patient Has Alerts: No cellular tissue based products placed in the center since last visit: Has Dressing in Place as Prescribed: Yes Has Compression in Place as Prescribed: Yes Pain Present Now: No Electronic Signature(s) Signed: 07/22/2023 12:52:02 PM By: Thayer Dallas Entered By: Thayer Dallas on 07/19/2023 10:26:05 -------------------------------------------------------------------------------- Clinic Level of Care Assessment Details Patient Name: Date of Service: [name], DA [name]. 07/19/2023 1:15 PM Medical Record Number: [id number] Patient Account Number: [id number] Date of Birth/Sex: Treating RN: 08/27/1951 (72 y.o. M) Primary Care Cristen Murcia: [name] NDI Other Clinician: Referring Ramey Ketcherside: Treating Jasline Buskirk/Extender: Geralyn Corwin HA RRIS, BRA NDI Weeks in Treatment: 8 Clinic Level of Care  Assessment Items TOOL 4 Quantity Score []  - 0 Use when only an EandM is performed on FOLLOW-UP visit ASSESSMENTS - Nursing Assessment / Reassessment X- 1 10 Reassessment of Co-morbidities (includes updates in patient status) X- 1 5 Reassessment of Adherence to Treatment Plan ASSESSMENTS - Wound and Skin A ssessment / Reassessment []  - 0 Simple Wound Assessment / Reassessment - one wound []  - 0 Complex Wound Assessment / Reassessment - multiple wounds []  - 0 Dermatologic / Skin Assessment (not related to wound area) ASSESSMENTS - Focused Assessment []  - 0 Circumferential Edema Measurements - multi extremities []  - 0 Nutritional Assessment / Counseling / Intervention [name], [name] ([id number]) [id number] Page 2 of 8 []  - 0 Lower Extremity Assessment (monofilament, tuning fork, pulses) []  - 0 Peripheral Arterial Disease Assessment (using hand held doppler) ASSESSMENTS - Ostomy and/or Continence Assessment and Care []  - 0 Incontinence Assessment and Management []  - 0 Ostomy Care Assessment and Management (repouching, etc.) PROCESS - Coordination of Care []  - 0 Simple Patient / Family Education for ongoing care []  - 0 Complex (extensive) Patient / Family Education for ongoing care X- 1 10 Staff obtains Chiropractor, Records, T Results / Process Orders est []  - 0 Staff telephones HHA, Nursing Homes / Clarify orders / etc []  - 0 Routine Transfer to another Facility (non-emergent condition) []  - 0 Routine Hospital Admission (non-emergent condition) []  - 0 New Admissions / Manufacturing engineer / Ordering NPWT Apligraf, etc. , []  - 0 Emergency Hospital Admission (emergent condition) X- 1 10 Simple Discharge Coordination []  - 0 Complex (extensive) Discharge Coordination PROCESS - Special Needs []  - 0 Pediatric / Minor Patient Management []  - 0 Isolation Patient Management []  - 0 Hearing / Language / Visual special needs []  - 0 Assessment  of Community assistance (transportation, D/C planning, etc.) []  - 0 Additional assistance / Altered mentation []  - 0 Support Surface(s) Assessment (bed, cushion, seat,  etc.) INTERVENTIONS - Wound Cleansing / Measurement []  - 0 Simple Wound Cleansing - one wound []  - 0 Complex Wound Cleansing - multiple wounds []  - 0 Wound Imaging (photographs - any number of wounds) []  - 0 Wound Tracing (instead of photographs) []  - 0 Simple Wound Measurement - one wound []  - 0 Complex Wound Measurement - multiple wounds INTERVENTIONS - Wound Dressings []  - 0 Small Wound Dressing one or multiple wounds []  - 0 Medium Wound Dressing one or multiple wounds []  - 0 Large Wound Dressing one or multiple wounds []  - 0 Application of Medications - topical []  - 0 Application of Medications - injection INTERVENTIONS - Miscellaneous []  - 0 External ear exam []  - 0 Specimen Collection (cultures, biopsies, blood, body fluids, etc.) []  - 0 Specimen(s) / Culture(s) sent or taken to Lab for analysis []  - 0 Patient Transfer (multiple staff / Nurse, adult / Similar devices) []  - 0 Simple Staple / Suture removal (25 or less) []  - 0 Complex Staple / Suture removal (26 or more) []  - 0 Hypo / Hyperglycemic Management (close monitor of Blood Glucose) [name], [name] ([id number]) [id number].pdf Page 3 of 8 []  - 0 Ankle / Brachial Index (ABI) - do not check if billed separately X- 1 5 Vital Signs Has the patient been seen at the hospital within the last three years: Yes Total Score: 40 Level Of Care: New/Established - Level 2 Electronic Signature(s) Signed: 08/17/2023 9:23:56 AM By: Pearletha Alfred Entered By: Pearletha Alfred on 08/03/2023 08:40:03 -------------------------------------------------------------------------------- Encounter Discharge Information Details Patient Name: Date of Service: [name], DA [name]. 07/19/2023 1:15 PM Medical Record Number: [id number] Patient Account  Number: [id number] Date of Birth/Sex: Treating RN: 07-30-1951 (72 y.o. Cline Cools Primary Care Keyly Baldonado: [name] NDI Other Clinician: Referring Paolina Karwowski: Treating Juniel Groene/Extender: Geralyn Corwin HA RRIS, BRA NDI Weeks in Treatment: 8 Encounter Discharge Information Items Discharge Condition: Stable Ambulatory Status: Wheelchair Discharge Destination: Home Transportation: Private Auto Accompanied By: self Schedule Follow-up Appointment: Yes Clinical Summary of Care: Patient Declined Electronic Signature(s) Signed: 07/19/2023 5:25:55 PM By: Redmond Pulling RN, BSN Entered By: Redmond Pulling on 07/19/2023 11:25:58 -------------------------------------------------------------------------------- Lower Extremity Assessment Details Patient Name: Date of Service: [name], DA [name]. 07/19/2023 1:15 PM Medical Record Number: [id number] Patient Account Number: [id number] Date of Birth/Sex: Treating RN: Jun 04, 1951 (72 y.o. M) Primary Care Treyshaun Keatts: [name] NDI Other Clinician: Referring Michae Grimley: Treating Charrise Lardner/Extender: Geralyn Corwin HA RRIS, BRA NDI Weeks in Treatment: 8 Edema Assessment Assessed: [Left: No] [Right: No] Edema: [Left: Ye] [Right: s] Calf Left: Right: Point of Measurement: 37 cm From Medial Instep 38.5 cm Ankle Left: Right: Point of Measurement: 11 cm From Medial Instep 22.5 cm Vascular Assessment Left: [[id number] Page 4 of 8Right:] Extremity colors, hair growth, and conditions: Extremity Color: [id number].pdf Page 4 of 8Hyperpigmented] Hair Growth on Extremity: [phone].pdf Page 4 of 8No] Temperature of Extremity: 279 369 1685.pdf Page 4 of 8Warm] Capillary Refill: [phone].pdf Page 4 of 8< 3 seconds] Dependent Rubor: [phone].pdf Page 4 of 8No Yes] Electronic Signature(s) Signed: 07/22/2023  12:52:02 PM By: Thayer Dallas Entered By: Thayer Dallas on 07/19/2023 10:39:36 -------------------------------------------------------------------------------- Multi Wound Chart Details Patient Name: Date of Service: [name], DA [name]. 07/19/2023 1:15 PM Medical Record Number: [id number] Patient Account Number: [id number] Date of Birth/Sex: Treating RN: 1951-07-09 (72 y.o. M) Primary Care Simuel Stebner: [name] NDI Other Clinician: Referring Carlyle Achenbach: Treating Kaede Clendenen/Extender: Geralyn Corwin HA RRIS, BRA NDI Weeks in Treatment: 8 Vital Signs  Height(in): Pulse(bpm): 64 Weight(lbs): Blood Pressure(mmHg): 114/52 Body Mass Index(BMI): Temperature(F): 98.1 Respiratory Rate(breaths/min): 18 [16:Photos:] [N/A:N/A] Right Lower Leg N/A N/A Wound Location: Gradually Appeared N/A N/A Wounding Event: Lymphedema N/A N/A Primary Etiology: Cataracts, Lymphedema, Arrhythmia, N/A N/A Comorbid History: Peripheral Venous Disease, Type II Diabetes, Osteoarthritis, Neuropathy 06/14/2023 N/A N/A Date Acquired: 4 N/A N/A Weeks of Treatment: Open N/A N/A Wound Status: No N/A N/A Wound Recurrence: 0x0x0 N/A N/A Measurements Reese x W x D (cm) 0 N/A N/A A (cm) : rea 0 N/A N/A Volume (cm) : 100.00% N/A N/A % Reduction in Area: 100.00% N/A N/A % Reduction in Volume: Full Thickness Without Exposed N/A N/A Classification: Support Structures None Present N/A N/A Exudate Amount: Distinct, outline attached N/A N/A Wound Margin: None Present (0%) N/A N/A Granulation Amount: None Present (0%) N/A N/A Necrotic Amount: Fat Layer (Subcutaneous Tissue): Yes N/A N/A Exposed Structures: Fascia: No Tendon: No Muscle: No Joint: No Bone: No Large (67-100%) N/A N/A Epithelialization: Scarring: Yes N/A N/A Periwound Skin Texture: Excoriation: No [name], [name] ([id number]) [id number] Page 5 of 8 Induration: No Callus: No Crepitus: No Rash: No Dry/Scaly: Yes  N/A N/A Periwound Skin Moisture: Maceration: No Hemosiderin Staining: Yes N/A N/A Periwound Skin Color: Atrophie Blanche: No Cyanosis: No Ecchymosis: No Erythema: No Mottled: No Pallor: No Rubor: No No Abnormality N/A N/A Temperature: Treatment Notes Electronic Signature(s) Signed: 07/19/2023 5:13:05 PM By: Geralyn Corwin DO Entered By: Geralyn Corwin on 07/19/2023 10:53:12 -------------------------------------------------------------------------------- Multi-Disciplinary Care Plan Details Patient Name: Date of Service: [name], DA [name]. 07/19/2023 1:15 PM Medical Record Number: [id number] Patient Account Number: [id number] Date of Birth/Sex: Treating RN: 02-26-1951 (72 y.o. Cline Cools Primary Care Normal Recinos: [name] NDI Other Clinician: Referring Robbi Spells: Treating Phynix Horton/Extender: Cannon Kettle, BRA NDI Weeks in Treatment: 8 Active Inactive Electronic Signature(s) Signed: 07/19/2023 5:25:55 PM By: Redmond Pulling RN, BSN Entered By: Redmond Pulling on 07/19/2023 11:24:46 -------------------------------------------------------------------------------- Pain Assessment Details Patient Name: Date of Service: [name], DA [name]. 07/19/2023 1:15 PM Medical Record Number: [id number] Patient Account Number: [id number] Date of Birth/Sex: Treating RN: [birth year] (71 y.o. M) Primary Care Revan Gendron: [name] NDI Other Clinician: Referring Sevag Shearn: Treating Moriah Loughry/Extender: Geralyn Corwin HA RRIS, BRA NDI Weeks in Treatment: 8 Active Problems Location of Pain Severity and Description of Pain Patient Has Paino No Site Locations [name], [name] ([id number]) [phone].pdf Page 6 of 8 Pain Management and Medication Current Pain Management: Electronic Signature(s) Signed: 07/22/2023 12:52:02 PM By: Thayer Dallas Entered By: Thayer Dallas on 07/19/2023  10:27:34 -------------------------------------------------------------------------------- Patient/Caregiver Education Details Patient Name: Date of Service: [name], DA [name]. 8/6/2024andnbsp1:15 PM Medical Record Number: [id number] Patient Account Number: [id number] Date of Birth/Gender: Treating RN: [birth year] (72 y.o. Cline Cools Primary Care Physician: [name] NDI Other Clinician: Referring Physician: Treating Physician/Extender: Cannon Kettle, BRA NDI Weeks in Treatment: 8 Education Assessment Education Provided To: Patient Education Topics Provided Wound/Skin Impairment: Methods: Explain/Verbal Responses: State content correctly Electronic Signature(s) Signed: 07/19/2023 5:25:55 PM By: Redmond Pulling RN, BSN Entered By: Redmond Pulling on 07/19/2023 11:25:00 -------------------------------------------------------------------------------- Wound Assessment Details Patient Name: Date of Service: [name], DA [name]. 07/19/2023 1:15 PM Medical Record Number: [id number] Patient Account Number: [id number] Date of Birth/Sex: Treating RN: 04-Jan-1951 (72 y.o. Cline Cools Primary Care [name]: [name] NDI Other Clinician: Referring Chucky Homes: Treating Steed Kanaan/Extender: Galen Manila NDI [name], [name] ([id number]) [id number] Page 7 of 8 Weeks in  Treatment: 8 Wound Status Wound Number: [id number] Primary Lymphedema Etiology: Wound Location: Right Lower Leg Wound Open Wounding Event: Gradually Appeared Status: Date Acquired: 06/14/2023 Comorbid Cataracts, Lymphedema, Arrhythmia, Peripheral Venous Disease, Weeks Of Treatment: 4 History: Type II Diabetes, Osteoarthritis, Neuropathy Clustered Wound: No Photos Wound Measurements Length: (cm) Width: (cm) Depth: (cm) Area: (cm) Volume: (cm) 0 % Reduction in Area: 100% 0 % Reduction in Volume: 100% 0 Epithelialization: Large (67-100%) 0 Tunneling: No 0  Undermining: No Wound Description Classification: Full Thickness Without Exposed Support Wound Margin: Distinct, outline attached Exudate Amount: None Present Structures Foul Odor After Cleansing: No Slough/Fibrino No Wound Bed Granulation Amount: None Present (0%) Exposed Structure Necrotic Amount: None Present (0%) Fascia Exposed: No Fat Layer (Subcutaneous Tissue) Exposed: Yes Tendon Exposed: No Muscle Exposed: No Joint Exposed: No Bone Exposed: No Periwound Skin Texture Texture Color No Abnormalities Noted: No No Abnormalities Noted: No Callus: No Atrophie Blanche: No Crepitus: No Cyanosis: No Excoriation: No Ecchymosis: No Induration: No Erythema: No Rash: No Hemosiderin Staining: Yes Scarring: Yes Mottled: No Pallor: No Moisture Rubor: No No Abnormalities Noted: No Dry / Scaly: Yes Temperature / Pain Maceration: No Temperature: No Abnormality Electronic Signature(s) Signed: 07/19/2023 5:25:55 PM By: Redmond Pulling RN, BSN Entered By: Redmond Pulling on 07/19/2023 10:51:53 Vitals Details -------------------------------------------------------------------------------- [name] ([id number]) [id number] Page 8 of 8 Patient Name: Date of Service: [name] 07/19/2023 1:15 PM Medical Record Number: [id number] Patient Account Number: [id number] Date of Birth/Sex: Treating RN: November 28, 1951 (72 y.o. M) Primary Care Mckinzee Spirito: [name] NDI Other Clinician: Referring Dinari Stgermaine: Treating Geneva Barrero/Extender: Geralyn Corwin HA RRIS, BRA NDI Weeks in Treatment: 8 Vital Signs Time Taken: 13:27 Temperature (F): 98.1 Pulse (bpm): 64 Respiratory Rate (breaths/min): 18 Blood Pressure (mmHg): 114/52 Reference Range: 80 - 120 mg / dl Electronic Signature(s) Signed: 07/22/2023 12:52:02 PM By: Thayer Dallas Entered By: Thayer Dallas on 07/19/2023 10:28:40

## 2023-08-02 ENCOUNTER — Ambulatory Visit (INDEPENDENT_AMBULATORY_CARE_PROVIDER_SITE_OTHER): Payer: 59 | Admitting: Internal Medicine

## 2023-08-02 ENCOUNTER — Telehealth: Payer: Self-pay

## 2023-08-02 ENCOUNTER — Encounter: Payer: Self-pay | Admitting: Internal Medicine

## 2023-08-02 VITALS — BP 138/80 | HR 46 | Ht 75.0 in

## 2023-08-02 DIAGNOSIS — E1110 Type 2 diabetes mellitus with ketoacidosis without coma: Secondary | ICD-10-CM | POA: Diagnosis not present

## 2023-08-02 DIAGNOSIS — Z7901 Long term (current) use of anticoagulants: Secondary | ICD-10-CM

## 2023-08-02 DIAGNOSIS — Z85038 Personal history of other malignant neoplasm of large intestine: Secondary | ICD-10-CM | POA: Diagnosis not present

## 2023-08-02 DIAGNOSIS — K5909 Other constipation: Secondary | ICD-10-CM

## 2023-08-02 NOTE — Telephone Encounter (Signed)
     Primary Cardiologist: Chrystie Nose, MD  Chart reviewed as part of pre-operative protocol coverage. Given past medical history and time since last visit, based on ACC/AHA guidelines, [name] would be at acceptable risk for the planned procedure without further cardiovascular testing.   Patient with diagnosis of afib on Eliquis for anticoagulation.     Procedure: colonoscopy Date of procedure: 08/29/23     CHA2DS2-VASc Score = 5  This indicates a 7.2% annual risk of stroke. The patient's score is based upon: CHF History: 1 HTN History: 1 Diabetes History: 1 Stroke History: 0 Vascular Disease History: 1 Age Score: 1 Gender Score: 0     CrCl 83 ml/min using adjusted body weight Platelet count 171   Per office protocol, patient can hold Eliquis for 2 days prior to procedure.  I will route this recommendation to the requesting party via Epic fax function and remove from pre-op pool.  Please call with questions.  Thomasene Ripple. [name] NP-C     08/02/2023, 3:42 PM [facility] [address] Office [phone] Fax [phone]

## 2023-08-02 NOTE — Telephone Encounter (Signed)
[facility] Pre-operative Risk Assessment     Request for surgical clearance:     Endoscopy Procedure  What type of surgery is being performed?     Colonoscopy  When is this surgery scheduled?     08/29/2023  What type of clearance is required ?   Pharmacy  Are there any medications that need to be held prior to surgery and how long? Eliquis - 2 days  Practice name and name of physician performing surgery?      [facility]  What is your office phone and fax number?      Phone- [phone]  Fax- [phone]  Anesthesia type (None, local, MAC, general) ?       MAC

## 2023-08-02 NOTE — Telephone Encounter (Signed)
Patient with diagnosis of afib on Eliquis for anticoagulation.    Procedure: colonoscopy Date of procedure: 08/29/23   CHA2DS2-VASc Score = 5  This indicates a 7.2% annual risk of stroke. The patient's score is based upon: CHF History: 1 HTN History: 1 Diabetes History: 1 Stroke History: 0 Vascular Disease History: 1 Age Score: 1 Gender Score: 0   CrCl 83 ml/min using adjusted body weight Platelet count 171  Per office protocol, patient can hold Eliquis for 2 days prior to procedure.    **This guidance is not considered finalized until pre-operative APP has relayed final recommendations.**

## 2023-08-02 NOTE — Patient Instructions (Signed)
You will be contacted by our office prior to your procedure for directions on holding your Eliquis  If you do not hear from our office 1 week prior to your scheduled procedure, please call [phone] to discuss.   You have been scheduled for a colonoscopy. Please follow written instructions given to you at your visit today.   Please pick up your prep supplies at the pharmacy within the next 1-3 days.  If you use inhalers (even only as needed), please bring them with you on the day of your procedure.  DO NOT TAKE 7 DAYS PRIOR TO TEST- Trulicity (dulaglutide) Ozempic, Wegovy (semaglutide) Mounjaro (tirzepatide) Bydureon Bcise (exanatide extended release)  DO NOT TAKE 1 DAY PRIOR TO YOUR TEST Rybelsus (semaglutide) Adlyxin (lixisenatide) Victoza (liraglutide) Byetta (exanatide) ___________________________________________________________________________

## 2023-08-02 NOTE — Progress Notes (Signed)
HISTORY OF PRESENT ILLNESS:  [name] is a 72 y.o. male with multiple significant medical problems as listed below.  These include chronic atrial fibrillation on chronic Eliquis therapy, longstanding diabetes mellitus with peripheral vascular disease status post left BKA, morbid obesity, hypertension, chronic constipation, and a personal history of colon cancer status post laparoscopic right Hemi colectomy December 2017.  He is sent today regarding surveillance colonoscopy, by his PCP.  His 1 and only colonoscopy was November 17, 2016 with Dr. Russella Dar.  He is also status post laparoscopic cholecystectomy September 2023 and a left BKA November 2023.  He is a current resident at [facility].  For his chronic constipation he takes MiraLAX and Senokot.  With this regimen he has daily bowel movements.  No bleeding.  He takes iron.  He is on metformin.  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   Cataract    Constipation, chronic 11/09/2016   Diabetes mellitus without complication (HCC)    History of COVID-19 09/09/2022   History of homelessness 09/09/2022   History of kidney stones 09/09/2022   History of Stercoral ulcer of rectum 11/28/2016   Hyperlipidemia    Hypertension    Lichen planus    bilateral legs   Nausea & vomiting 09/09/2022   Obesity (BMI 30-39.9) 09/09/2022   Personal history of cecal colon cancer 09/09/2022    Past Surgical History:  Procedure Laterality Date   AMPUTATION Left 11/03/2022   Procedure: BELOW KNEE  AMPUTATION, LEFT;  Surgeon: Nadara Mustard, MD;  Location: [facility] OR;  Service: Orthopedics;  Laterality: Left;   APPENDECTOMY     APPLICATION OF WOUND VAC Left 11/03/2022   Procedure: APPLICATION OF  WOUND VAC;  Surgeon: Nadara Mustard, MD;  Location: [facility] OR;  Service: Orthopedics;  Laterality: Left;   CHOLECYSTECTOMY N/A 09/11/2022   Procedure: LAPAROSCOPIC CHOLECYSTECTOMY;  Surgeon: Abigail Miyamoto, MD;  Location: [facility] ORS;  Service: General;  Laterality: N/A;   COLON RESECTION N/A 11/22/2016   Procedure: HAND ASSISTED LAPAROSCOPIC COLON RESECTION;  Surgeon: Avel Peace, MD;  Location: [facility];  Service: General;  Laterality: N/A;   COLONOSCOPY N/A 11/17/2016   Procedure: COLONOSCOPY;  Surgeon: Meryl Dare, MD;  Location: [facility];  Service: Endoscopy;  Laterality: N/A;   FRACTURE SURGERY     MULTIPLE TOOTH EXTRACTIONS     RECTAL EXAM UNDER ANESTHESIA N/A 12/05/2016   Procedure: RECTAL EXAM UNDER ANESTHESIA, DISIMPACTION;  Surgeon: Ovidio Kin, MD;  Location: [facility];  Service: General;  Laterality: N/A;    Social History [name]  reports that he quit smoking about 39 years ago. His smoking use included pipe. He has never used smokeless tobacco. He reports that he does not drink alcohol and does not use drugs.  family history includes Alzheimer's disease in his father; Brain cancer in his mother; Cancer in his maternal grandmother; Heart attack in his maternal grandfather; Pneumonia in his paternal grandfather.  Allergies  Allergen Reactions   Other Itching and Other (See Comments)    [name] bandage (self-gripping) - erythema, itching       PHYSICAL EXAMINATION: Vital signs: BP 138/80   Pulse (!) 46   Ht 6\' 3"  (1.905 m)   BMI 31.75  kg/m   Constitutional: Obese, chronically ill-appearing, no acute distress.  In wheelchair Psychiatric: alert and oriented x3, cooperative Eyes: extraocular movements intact, anicteric, conjunctiva pink Mouth: oral pharynx moist, no lesions Neck: supple no lymphadenopathy Cardiovascular: heart irregular rate and rhythm, no murmur Lungs: clear to auscultation bilaterally Abdomen: soft, nontender, nondistended, no  obvious ascites, no peritoneal signs, normal bowel sounds, no organomegaly Rectal: Omitted Extremities: Bandages on right lower extremity.  Status post left BKA Skin: no relevant lesions on visible extremities Neuro: No focal deficits.  Cranial nerves intact  ASSESSMENT:  1.  Cecal cancer status post laparoscopic right hemicolectomy 2017 2.  Multiple significant medical problems 3.  History of atrial fibrillation on chronic Eliquis.  Seen by his cardiology team June 10, 2023 4.  Diabetes mellitus 5.  Chronic constipation   PLAN:  1.  Surveillance colonoscopy.  The patient is HIGH RISK given his comorbidities and chronic anticoagulation state.The nature of the procedure, as well as the risks, benefits, and alternatives were carefully and thoroughly reviewed with the patient. Ample time for discussion and questions allowed. The patient understood, was satisfied, and agreed to proceed.  The procedure will need to be performed at the hospital with monitored anesthesia care. 2.  Hold Eliquis 2 days prior to the procedure.  Will check with his cardiologist, Dr. Rennis Golden. 3.  Adjust diabetic medications the day of the procedure to avoid unwanted hypoglycemia 4.  Ongoing general medical care with PCP A total time of 65 minutes was spent preparing to see the patient, reviewing emerita data, obtaining comprehensive history, performing medically appropriate physical examination, counseling and educating the patient regarding above listed issues, ordering endoscopic procedure, adjusting medications, and documenting clinical information in the health record.

## 2023-08-02 NOTE — H&P (View-Only) (Signed)
HISTORY OF PRESENT ILLNESS:  [name] is a 72 y.o. male with multiple significant medical problems as listed below.  These include chronic atrial fibrillation on chronic Eliquis therapy, longstanding diabetes mellitus with peripheral vascular disease status post left BKA, morbid obesity, hypertension, chronic constipation, and a personal history of colon cancer status post laparoscopic right Hemi colectomy December 2017.  He is sent today regarding surveillance colonoscopy, by his PCP.  His 1 and only colonoscopy was November 17, 2016 with Dr. Russella Dar.  He is also status post laparoscopic cholecystectomy September 2023 and a left BKA November 2023.  He is a current resident at [facility].  For his chronic constipation he takes MiraLAX and Senokot.  With this regimen he has daily bowel movements.  No bleeding.  He takes iron.  He is on metformin.  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   Cataract    Constipation, chronic 11/09/2016   Diabetes mellitus without complication (HCC)    History of COVID-19 09/09/2022   History of homelessness 09/09/2022   History of kidney stones 09/09/2022   History of Stercoral ulcer of rectum 11/28/2016   Hyperlipidemia    Hypertension    Lichen planus    bilateral legs   Nausea & vomiting 09/09/2022   Obesity (BMI 30-39.9) 09/09/2022   Personal history of cecal colon cancer 09/09/2022    Past Surgical History:  Procedure Laterality Date   AMPUTATION Left 11/03/2022   Procedure: BELOW KNEE  AMPUTATION, LEFT;  Surgeon: Nadara Mustard, MD;  Location: [facility] OR;  Service: Orthopedics;  Laterality: Left;   APPENDECTOMY     APPLICATION OF WOUND VAC Left 11/03/2022   Procedure: APPLICATION OF  WOUND VAC;  Surgeon: Nadara Mustard, MD;  Location: [facility] OR;  Service: Orthopedics;  Laterality: Left;   CHOLECYSTECTOMY N/A 09/11/2022   Procedure: LAPAROSCOPIC CHOLECYSTECTOMY;  Surgeon: Abigail Miyamoto, MD;  Location: [facility];  Service: General;  Laterality: N/A;   COLON RESECTION N/A 11/22/2016   Procedure: HAND ASSISTED LAPAROSCOPIC COLON RESECTION;  Surgeon: Avel Peace, MD;  Location: [facility];  Service: General;  Laterality: N/A;   COLONOSCOPY N/A 11/17/2016   Procedure: COLONOSCOPY;  Surgeon: Meryl Dare, MD;  Location: [facility];  Service: Endoscopy;  Laterality: N/A;   FRACTURE SURGERY     MULTIPLE TOOTH EXTRACTIONS     RECTAL EXAM UNDER ANESTHESIA N/A 12/05/2016   Procedure: RECTAL EXAM UNDER ANESTHESIA, DISIMPACTION;  Surgeon: Ovidio Kin, MD;  Location: [facility];  Service: General;  Laterality: N/A;    Social History [name]  reports that he quit smoking about 39 years ago. His smoking use included pipe. He has never used smokeless tobacco. He reports that he does not drink alcohol and does not use drugs.  family history includes Alzheimer's disease in his father; Brain cancer in his mother; Cancer in his maternal grandmother; Heart attack in his maternal grandfather; Pneumonia in his paternal grandfather.  Allergies  Allergen Reactions   Other Itching and Other (See Comments)    [name] bandage (self-gripping) - erythema, itching       PHYSICAL EXAMINATION: Vital signs: BP 138/80   Pulse (!) 46   Ht 6\' 3"  (1.905 m)   BMI 31.75  kg/m   Constitutional: Obese, chronically ill-appearing, no acute distress.  In wheelchair Psychiatric: alert and oriented x3, cooperative Eyes: extraocular movements intact, anicteric, conjunctiva pink Mouth: oral pharynx moist, no lesions Neck: supple no lymphadenopathy Cardiovascular: heart irregular rate and rhythm, no murmur Lungs: clear to auscultation bilaterally Abdomen: soft, nontender, nondistended, no  obvious ascites, no peritoneal signs, normal bowel sounds, no organomegaly Rectal: Omitted Extremities: Bandages on right lower extremity.  Status post left BKA Skin: no relevant lesions on visible extremities Neuro: No focal deficits.  Cranial nerves intact  ASSESSMENT:  1.  Cecal cancer status post laparoscopic right hemicolectomy 2017 2.  Multiple significant medical problems 3.  History of atrial fibrillation on chronic Eliquis.  Seen by his cardiology team June 10, 2023 4.  Diabetes mellitus 5.  Chronic constipation   PLAN:  1.  Surveillance colonoscopy.  The patient is HIGH RISK given his comorbidities and chronic anticoagulation state.The nature of the procedure, as well as the risks, benefits, and alternatives were carefully and thoroughly reviewed with the patient. Ample time for discussion and questions allowed. The patient understood, was satisfied, and agreed to proceed.  The procedure will need to be performed at the hospital with monitored anesthesia care. 2.  Hold Eliquis 2 days prior to the procedure.  Will check with his cardiologist, Dr. Rennis Golden. 3.  Adjust diabetic medications the day of the procedure to avoid unwanted hypoglycemia 4.  Ongoing general medical care with PCP A total time of 65 minutes was spent preparing to see the patient, reviewing emerita data, obtaining comprehensive history, performing medically appropriate physical examination, counseling and educating the patient regarding above listed issues, ordering endoscopic procedure, adjusting medications, and documenting clinical information in the health record.

## 2023-08-03 ENCOUNTER — Other Ambulatory Visit (HOSPITAL_BASED_OUTPATIENT_CLINIC_OR_DEPARTMENT_OTHER): Payer: Self-pay | Admitting: Cardiology

## 2023-08-03 ENCOUNTER — Telehealth: Payer: Self-pay

## 2023-08-03 DIAGNOSIS — E782 Mixed hyperlipidemia: Secondary | ICD-10-CM

## 2023-08-03 DIAGNOSIS — E119 Type 2 diabetes mellitus without complications: Secondary | ICD-10-CM

## 2023-08-03 DIAGNOSIS — R600 Localized edema: Secondary | ICD-10-CM

## 2023-08-03 DIAGNOSIS — I10 Essential (primary) hypertension: Secondary | ICD-10-CM

## 2023-08-03 DIAGNOSIS — I77810 Thoracic aortic ectasia: Secondary | ICD-10-CM

## 2023-08-03 DIAGNOSIS — D6859 Other primary thrombophilia: Secondary | ICD-10-CM

## 2023-08-03 DIAGNOSIS — I890 Lymphedema, not elsewhere classified: Secondary | ICD-10-CM

## 2023-08-03 DIAGNOSIS — I4510 Unspecified right bundle-branch block: Secondary | ICD-10-CM

## 2023-08-04 ENCOUNTER — Ambulatory Visit (INDEPENDENT_AMBULATORY_CARE_PROVIDER_SITE_OTHER): Payer: 59

## 2023-08-04 DIAGNOSIS — I77810 Thoracic aortic ectasia: Secondary | ICD-10-CM

## 2023-08-04 DIAGNOSIS — Z7984 Long term (current) use of oral hypoglycemic drugs: Secondary | ICD-10-CM | POA: Diagnosis not present

## 2023-08-04 DIAGNOSIS — E119 Type 2 diabetes mellitus without complications: Secondary | ICD-10-CM | POA: Diagnosis not present

## 2023-08-04 LAB — VAS US ABI WITH/WO TBI

## 2023-08-05 ENCOUNTER — Telehealth (HOSPITAL_BASED_OUTPATIENT_CLINIC_OR_DEPARTMENT_OTHER): Payer: Self-pay

## 2023-08-05 NOTE — Telephone Encounter (Addendum)
Called results to patient and left results on VM (ok per DPR), instructions left to call office back if patient has any questions!     ----- Message from Rip Harbour sent at 08/05/2023 10:18 AM EDT ----- Please let [name] know that his ABI showed slight change from previous study but it indicates adequate blood flow to the toes. Continue current medications and follow up with Gillian Shields, NP on 08/30/23 as planned.

## 2023-08-05 NOTE — Telephone Encounter (Signed)
Lm on vm that per cardiology patient could hold his Eliquis for 2 days prior to his procedure.  Asked that he call me back to let me know he got this message and understood the information.

## 2023-08-09 NOTE — Telephone Encounter (Signed)
Lm on vm regarding Eliquis

## 2023-08-17 NOTE — Telephone Encounter (Signed)
Lm on vm regarding Eliquis

## 2023-08-22 ENCOUNTER — Encounter (HOSPITAL_COMMUNITY): Payer: Self-pay | Admitting: Internal Medicine

## 2023-08-22 NOTE — Telephone Encounter (Signed)
Spoke to patient and let him know that he could hold his Eliquis for 2 days prior to his procedure.  Patient agreed and will call me back if he has any questions.

## 2023-08-28 NOTE — Anesthesia Preprocedure Evaluation (Signed)
Anesthesia Evaluation  Patient identified by MRN, date of birth, ID band Patient awake    Reviewed: Allergy & Precautions, NPO status , Patient's Chart, lab work & pertinent test results  Airway Mallampati: II  TM Distance: >3 FB Neck ROM: Full    Dental  (+) Edentulous Upper, Edentulous Lower   Pulmonary former smoker   breath sounds clear to auscultation       Cardiovascular hypertension, Pt. on medications +CHF  + dysrhythmias Atrial Fibrillation  Rhythm:Irregular Rate:Normal  Echo 2.2024  1. Left ventricular ejection fraction, by estimation, is 55 to 60%. The left ventricle has normal function. Left ventricular endocardial border not optimally defined to evaluate regional wall motion. Left ventricular diastolic function could not be evaluated.   2. Right ventricular systolic function is normal. The right ventricular size is normal. Tricuspid regurgitation signal is inadequate for assessing PA pressure.   3. Left atrial size was mildly dilated.   4. The mitral valve is grossly normal. Trivial mitral valve regurgitation. No evidence of mitral stenosis.   5. The aortic valve is tricuspid. Aortic valve regurgitation is not visualized. No aortic stenosis is present.   6. Aortic dilatation noted. Aneurysm of the aortic root, measuring 45 mm.   7. The inferior vena cava is normal in size with greater than 50% respiratory variability, suggesting right atrial pressure of 3 mmHg.   Comparison(s): No significant change from prior study.     Neuro/Psych negative neurological ROS  negative psych ROS   GI/Hepatic Neg liver ROS, PUD,GERD  Medicated,,  Endo/Other  diabetes, Type 2, Oral Hypoglycemic Agents    Renal/GU Renal InsufficiencyRenal disease     Musculoskeletal  (+) Arthritis ,    Abdominal  (+) + obese  Peds  Hematology  (+) Blood dyscrasia, anemia   Anesthesia Other Findings   Reproductive/Obstetrics                              Anesthesia Physical Anesthesia Plan  ASA: 3  Anesthesia Plan: MAC   Post-op Pain Management: Minimal or no pain anticipated   Induction: Intravenous  PONV Risk Score and Plan: 1 and Ondansetron, Propofol infusion, Treatment may vary due to age or medical condition and TIVA  Airway Management Planned: Simple Face Mask, Natural Airway and Nasal Cannula  Additional Equipment: None  Intra-op Plan:   Post-operative Plan:   Informed Consent: I have reviewed the patients History and Physical, chart, labs and discussed the procedure including the risks, benefits and alternatives for the proposed anesthesia with the patient or authorized representative who has indicated his/her understanding and acceptance.     Dental advisory given  Plan Discussed with: CRNA  Anesthesia Plan Comments:        Anesthesia Quick Evaluation

## 2023-08-29 ENCOUNTER — Ambulatory Visit (HOSPITAL_COMMUNITY)
Admission: RE | Admit: 2023-08-29 | Discharge: 2023-08-29 | Disposition: A | Discharge status: Home / Self Care | Payer: 59 | Attending: Internal Medicine | Admitting: Internal Medicine

## 2023-08-29 ENCOUNTER — Ambulatory Visit (HOSPITAL_COMMUNITY): Payer: 59 | Admitting: Anesthesiology

## 2023-08-29 ENCOUNTER — Other Ambulatory Visit: Payer: Self-pay

## 2023-08-29 ENCOUNTER — Encounter (HOSPITAL_COMMUNITY)
Admission: RE | Disposition: A | Discharge status: Home / Self Care | Payer: Self-pay | Source: Home / Self Care | Attending: Internal Medicine

## 2023-08-29 ENCOUNTER — Encounter (HOSPITAL_COMMUNITY): Payer: Self-pay | Admitting: Internal Medicine

## 2023-08-29 DIAGNOSIS — E669 Obesity, unspecified: Secondary | ICD-10-CM | POA: Diagnosis not present

## 2023-08-29 DIAGNOSIS — I509 Heart failure, unspecified: Secondary | ICD-10-CM | POA: Diagnosis not present

## 2023-08-29 DIAGNOSIS — Z1211 Encounter for screening for malignant neoplasm of colon: Secondary | ICD-10-CM | POA: Insufficient documentation

## 2023-08-29 DIAGNOSIS — Z9049 Acquired absence of other specified parts of digestive tract: Secondary | ICD-10-CM | POA: Diagnosis not present

## 2023-08-29 DIAGNOSIS — Z85038 Personal history of other malignant neoplasm of large intestine: Secondary | ICD-10-CM | POA: Diagnosis not present

## 2023-08-29 DIAGNOSIS — I110 Hypertensive heart disease with heart failure: Secondary | ICD-10-CM | POA: Insufficient documentation

## 2023-08-29 DIAGNOSIS — K219 Gastro-esophageal reflux disease without esophagitis: Secondary | ICD-10-CM | POA: Diagnosis not present

## 2023-08-29 DIAGNOSIS — K648 Other hemorrhoids: Secondary | ICD-10-CM | POA: Insufficient documentation

## 2023-08-29 DIAGNOSIS — Z87891 Personal history of nicotine dependence: Secondary | ICD-10-CM | POA: Diagnosis not present

## 2023-08-29 DIAGNOSIS — Z6832 Body mass index (BMI) 32.0-32.9, adult: Secondary | ICD-10-CM

## 2023-08-29 DIAGNOSIS — Z7984 Long term (current) use of oral hypoglycemic drugs: Secondary | ICD-10-CM | POA: Insufficient documentation

## 2023-08-29 DIAGNOSIS — I4891 Unspecified atrial fibrillation: Secondary | ICD-10-CM | POA: Diagnosis not present

## 2023-08-29 DIAGNOSIS — E119 Type 2 diabetes mellitus without complications: Secondary | ICD-10-CM | POA: Insufficient documentation

## 2023-08-29 DIAGNOSIS — K5730 Diverticulosis of large intestine without perforation or abscess without bleeding: Secondary | ICD-10-CM | POA: Diagnosis not present

## 2023-08-29 DIAGNOSIS — Z6839 Body mass index (BMI) 39.0-39.9, adult: Secondary | ICD-10-CM | POA: Insufficient documentation

## 2023-08-29 DIAGNOSIS — Z7901 Long term (current) use of anticoagulants: Secondary | ICD-10-CM | POA: Diagnosis not present

## 2023-08-29 HISTORY — PX: COLONOSCOPY WITH PROPOFOL: SHX5780

## 2023-08-29 LAB — GLUCOSE, CAPILLARY: Glucose-Capillary: 132 mg/dL — ABNORMAL HIGH (ref 70–99)

## 2023-08-29 SURGERY — COLONOSCOPY WITH PROPOFOL
Anesthesia: Monitor Anesthesia Care

## 2023-08-29 MED ORDER — SODIUM CHLORIDE 0.9 % IV SOLN: INTRAVENOUS | Status: DC

## 2023-08-29 MED ORDER — PROPOFOL 500 MG/50ML IV EMUL
INTRAVENOUS | Status: DC | PRN
  Administered 2023-08-29: 125 ug/kg/min via INTRAVENOUS
  Administered 2023-08-29: 50 mg via INTRAVENOUS

## 2023-08-29 MED ORDER — LACTATED RINGERS IV SOLN: INTRAVENOUS | Status: DC

## 2023-08-29 SURGICAL SUPPLY — 22 items
ELECT REM PT RETURN 9FT ADLT (ELECTROSURGICAL)
ELECTRODE REM PT RTRN 9FT ADLT (ELECTROSURGICAL) IMPLANT
FCP BXJMBJMB 240X2.8X (CUTTING FORCEPS)
FLOOR PAD 36X40 (MISCELLANEOUS) ×1
FORCEPS BIOP RAD 4 LRG CAP 4 (CUTTING FORCEPS) IMPLANT
FORCEPS BIOP RJ4 240 W/NDL (CUTTING FORCEPS)
FORCEPS BXJMBJMB 240X2.8X (CUTTING FORCEPS) IMPLANT
INJECTOR/SNARE I SNARE (MISCELLANEOUS) IMPLANT
LUBRICANT JELLY 4.5OZ STERILE (MISCELLANEOUS) IMPLANT
MANIFOLD NEPTUNE II (INSTRUMENTS) IMPLANT
NDL SCLEROTHERAPY 25GX240 (NEEDLE) IMPLANT
NEEDLE SCLEROTHERAPY 25GX240 (NEEDLE)
PAD FLOOR 36X40 (MISCELLANEOUS) ×1 IMPLANT
PROBE APC STR FIRE (PROBE) IMPLANT
PROBE INJECTION GOLD (MISCELLANEOUS)
PROBE INJECTION GOLD 7FR (MISCELLANEOUS) IMPLANT
SNARE ROTATE MED OVAL 20MM (MISCELLANEOUS) IMPLANT
SYR 50ML LL SCALE MARK (SYRINGE) IMPLANT
TRAP SPECIMEN MUCOUS 40CC (MISCELLANEOUS) IMPLANT
TUBING ENDO SMARTCAP PENTAX (MISCELLANEOUS) IMPLANT
TUBING IRRIGATION ENDOGATOR (MISCELLANEOUS) ×1 IMPLANT
WATER STERILE IRR 1000ML POUR (IV SOLUTION) IMPLANT

## 2023-08-29 NOTE — Op Note (Signed)
[facility] Patient Name: [name] Procedure Date: 08/29/2023 MRN: [id number] Attending MD: Wilhemina Bonito. Marina Goodell , MD, [id number] Date of Birth: 1951/05/15 CSN: [id number] Age: 72 Admit Type: Outpatient Procedure:                Colonoscopy Indications:              High risk colon cancer surveillance: Personal                            history of colon cancer. Status post right                            hemicolectomy December 2017 Providers:                Wilhemina Bonito. Marina Goodell, MD, Margaree Mackintosh, RN, Harrington Challenger, Technician Referring MD:             Cristino Martes, NP Medicines:                Monitored Anesthesia Care Complications:            No immediate complications. Estimated blood loss:                            None. Estimated Blood Loss:     Estimated blood loss: none. Procedure:                Pre-Anesthesia Assessment:                           - Prior to the procedure, a History and Physical                            was performed, and patient medications and                            allergies were reviewed. The patient's tolerance of                            previous anesthesia was also reviewed. The risks                            and benefits of the procedure and the sedation                            options and risks were discussed with the patient.                            All questions were answered, and informed consent                            was obtained. Prior Anticoagulants: The patient has                            taken Eliquis (  apixaban), last dose was 3 days                            prior to procedure. ASA Grade Assessment: III - A                            patient with severe systemic disease. After                            reviewing the risks and benefits, the patient was                            deemed in satisfactory condition to undergo the                            procedure.                            After obtaining informed consent, the colonoscope                            was passed under direct vision. Throughout the                            procedure, the patient's blood pressure, pulse, and                            oxygen saturations were monitored continuously. The                            CF-HQ190L ([id number]) Olympus colonoscope was                            introduced through the anus and advanced to the the                            ileocolonic anastomosis. The anastomotic region and                            rectum were photographed. The quality of the bowel                            preparation was excellent. The colonoscopy was                            performed without difficulty. The patient tolerated                            the procedure well. The bowel preparation used was                            SUPREP via split dose instruction. Scope In: 10:00:12 AM Scope Out: 10:13:08 AM Scope Withdrawal Time: 0 hours 9 minutes 39 seconds  Total Procedure Duration: 0 hours 12 minutes  56 seconds  Findings:      Multiple diverticula were found in the left colon.      Internal hemorrhoids were found during retroflexion. The hemorrhoids       were moderate.      Status post right hemicolectomy with unremarkable anastomosis. The exam       was otherwise without abnormality on direct and retroflexion views. Impression:               - Diverticulosis in the left colon.                           - Internal hemorrhoids.                           - The examination demonstrate evidence of prior                            right hemicolectomy but was otherwise normal on                            direct and retroflexion views.                           - No specimens collected. Moderate Sedation:      none Recommendation:           - Repeat colonoscopy is not recommended for                            surveillance (age, comorbidities).                            - Patient has a contact number available for                            emergencies. The signs and symptoms of potential                            delayed complications were discussed with the                            patient. Return to normal activities tomorrow.                            Written discharge instructions were provided to the                            patient.                           - Resume previous diet.                           - Continue present medications.                           - RESUME ELIQUIS TODAY  The procedure findings were reviewed with the                            patient. He was provided a copy of this report. Procedure Code(s):        --- Professional ---                           [phone], Colonoscopy, flexible; diagnostic, including                            collection of specimen(s) by brushing or washing,                            when performed (separate procedure) Diagnosis Code(s):        --- Professional ---                           Q65.784, Personal history of other malignant                            neoplasm of large intestine                           K64.8, Other hemorrhoids                           K57.30, Diverticulosis of large intestine without                            perforation or abscess without bleeding CPT copyright 2022 [employer]. All rights reserved. The codes documented in this report are preliminary and upon coder review may  be revised to meet current compliance requirements. Wilhemina Bonito. Marina Goodell, MD 08/29/2023 10:22:23 AM This report has been signed electronically. Number of Addenda: 0

## 2023-08-29 NOTE — Discharge Instructions (Signed)
YOU HAD AN ENDOSCOPIC PROCEDURE TODAY: Refer to the procedure report and other information in the discharge instructions given to you for any specific questions about what was found during the examination. If this information does not answer your questions, please call [place] office at [phone] to clarify.   YOU SHOULD EXPECT: Some feelings of bloating in the abdomen. Passage of more gas than usual. Walking can help get rid of the air that was put into your GI tract during the procedure and reduce the bloating. If you had a lower endoscopy (such as a colonoscopy or flexible sigmoidoscopy) you may notice spotting of blood in your stool or on the toilet paper. Some abdominal soreness may be present for a day or two, also.  DIET: Your first meal following the procedure should be a light meal and then it is ok to progress to your normal diet. A half-sandwich or bowl of soup is an example of a good first meal. Heavy or fried foods are harder to digest and may make you feel nauseous or bloated. Drink plenty of fluids but you should avoid alcoholic beverages for 24 hours. If you had a esophageal dilation, please see attached instructions for diet.    ACTIVITY: Your care partner should take you home directly after the procedure. You should plan to take it easy, moving slowly for the rest of the day. You can resume normal activity the day after the procedure however YOU SHOULD NOT DRIVE, use power tools, machinery or perform tasks that involve climbing or major physical exertion for 24 hours (because of the sedation medicines used during the test).   SYMPTOMS TO REPORT IMMEDIATELY: A gastroenterologist can be reached at any hour. Please call [phone]  for any of the following symptoms:  Following lower endoscopy (colonoscopy, flexible sigmoidoscopy) Excessive amounts of blood in the stool  Significant tenderness, worsening of abdominal pains  Swelling of the abdomen that is new, acute  Fever of 100 or  higher   FOLLOW UP:  If any biopsies were taken you will be contacted by phone or by letter within the next 1-3 weeks. Call [phone]  if you have not heard about the biopsies in 3 weeks.  Please also call with any specific questions about appointments or follow up tests.

## 2023-08-29 NOTE — Interval H&P Note (Signed)
History and Physical Interval Note:  08/29/2023 9:06 AM  [name]  has presented today for surgery, with the diagnosis of hx of colon cancer.  The various methods of treatment have been discussed with the patient and family. After consideration of risks, benefits and other options for treatment, the patient has consented to  Procedure(s): COLONOSCOPY WITH PROPOFOL (N/A) as a surgical intervention.  The patient's history has been reviewed, patient examined, no change in status, stable for surgery.  I have reviewed the patient's chart and labs.  Questions were answered to the patient's satisfaction.     Yancey Flemings

## 2023-08-29 NOTE — H&P (Signed)
Expand All Collapse All HISTORY OF PRESENT ILLNESS:   [name] is a 72 y.o. male with multiple significant medical problems as listed below.  These include chronic atrial fibrillation on chronic Eliquis therapy, longstanding diabetes mellitus with peripheral vascular disease status post left BKA, morbid obesity, hypertension, chronic constipation, and a personal history of colon cancer status post laparoscopic right Hemi colectomy December 2017.  He is sent today regarding surveillance colonoscopy, by his PCP.  His 1 and only colonoscopy was November 17, 2016 with Dr. Russella Dar.  He is also status post laparoscopic cholecystectomy September 2023 and a left BKA November 2023.   He is a current resident at [facility] assisted living.  For his chronic constipation he takes MiraLAX and Senokot.  With this regimen he has daily bowel movements.  No bleeding.  He takes iron.  He is on metformin.   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   Cataract     Constipation, chronic 11/09/2016   Diabetes mellitus without complication (HCC)     History of COVID-19 09/09/2022   History of homelessness 09/09/2022   History of kidney stones 09/09/2022   History of Stercoral ulcer of rectum 11/28/2016   Hyperlipidemia     Hypertension     Lichen planus      bilateral legs   Nausea & vomiting 09/09/2022   Obesity (BMI 30-39.9) 09/09/2022   Personal history of cecal colon cancer 09/09/2022               Past Surgical History:  Procedure Laterality Date   AMPUTATION Left 11/03/2022    Procedure: BELOW KNEE  AMPUTATION, LEFT;  Surgeon: Nadara Mustard, MD;  Location: [facility] OR;  Service: Orthopedics;  Laterality: Left;   APPENDECTOMY        APPLICATION OF WOUND VAC Left 11/03/2022    Procedure: APPLICATION OF WOUND VAC;  Surgeon: Nadara Mustard, MD;  Location: [facility] OR;  Service: Orthopedics;  Laterality: Left;   CHOLECYSTECTOMY N/A 09/11/2022    Procedure: LAPAROSCOPIC CHOLECYSTECTOMY;  Surgeon: Abigail Miyamoto, MD;  Location: [facility] ORS;  Service: General;  Laterality: N/A;   COLON RESECTION N/A 11/22/2016    Procedure: HAND ASSISTED LAPAROSCOPIC COLON RESECTION;  Surgeon: Avel Peace, MD;  Location: [facility];  Service: General;  Laterality: N/A;   COLONOSCOPY N/A 11/17/2016    Procedure: COLONOSCOPY;  Surgeon: Meryl Dare, MD;  Location: [facility] ENDOSCOPY;  Service: Endoscopy;  Laterality: N/A;   FRACTURE SURGERY       MULTIPLE TOOTH EXTRACTIONS       RECTAL EXAM UNDER ANESTHESIA N/A 12/05/2016    Procedure: RECTAL EXAM UNDER ANESTHESIA, DISIMPACTION;  Surgeon: Ovidio Kin, MD;  Location: [facility] ORS;  Service: General;  Laterality: N/A;          Social History [name]  reports that he quit smoking about 39 years ago. His smoking use included pipe. He has never used smokeless tobacco. He reports that he does not drink alcohol and does not use drugs.   family history includes Alzheimer's disease in his father; Brain cancer in his mother; Cancer in his maternal grandmother; Heart attack in his maternal grandfather; Pneumonia in his paternal grandfather.  Allergies       Allergies  Allergen Reactions   Other Itching and Other (See Comments)      [name] bandage (self-gripping) - erythema, itching            PHYSICAL EXAMINATION: Vital signs: BP 138/80   Pulse (!) 46   Ht 6\' 3"  (1.905 m)   BMI 31.75 kg/m   Constitutional: Obese, chronically ill-appearing, no acute distress.  In wheelchair Psychiatric: alert and oriented x3, cooperative Eyes: extraocular movements intact, anicteric, conjunctiva pink Mouth: oral pharynx moist, no lesions Neck: supple no lymphadenopathy Cardiovascular: heart irregular  rate and rhythm, no murmur Lungs: clear to auscultation bilaterally Abdomen: soft, nontender, nondistended, no obvious ascites, no peritoneal signs, normal bowel sounds, no organomegaly Rectal: Omitted Extremities: Bandages on right lower extremity.  Status post left BKA Skin: no relevant lesions on visible extremities Neuro: No focal deficits.  Cranial nerves intact   ASSESSMENT:   1.  Cecal cancer status post laparoscopic right hemicolectomy 2017 2.  Multiple significant medical problems 3.  History of atrial fibrillation on chronic Eliquis.  Seen by his cardiology team June 10, 2023 4.  Diabetes mellitus 5.  Chronic constipation     PLAN:   1.  Surveillance colonoscopy.  The patient is HIGH RISK given his comorbidities and chronic anticoagulation state.The nature of the procedure, as well as the risks, benefits, and alternatives were carefully and thoroughly reviewed with the patient. Ample time for discussion and questions allowed. The patient understood, was satisfied, and agreed to proceed.  The procedure will need to be performed at the hospital with monitored anesthesia care. 2.  Hold Eliquis 2 days prior to the procedure.  Will check with his cardiologist, Dr. Rennis Golden. 3.  Adjust diabetic medications the day of the procedure to avoid unwanted hypoglycemia 4.  Ongoing general medical care with PCP

## 2023-08-29 NOTE — Transfer of Care (Signed)
Immediate Anesthesia Transfer of Care Note  Patient: [name]  Procedure(s) Performed: Procedure(s): COLONOSCOPY WITH PROPOFOL (N/A)  Patient Location: PACU and Endoscopy Unit  Anesthesia Type:MAC  Level of Consciousness: awake, alert  and oriented  Airway & Oxygen Therapy: Patient Spontanous Breathing and Patient connected to nasal cannula oxygen  Post-op Assessment: Report given to RN and Post -op Vital signs reviewed and stable  Post vital signs: Reviewed and stable  Last Vitals:  Vitals:   08/29/23 0845  BP: (!) 168/61  Pulse: 78  Resp: 13  Temp: 36.7 C  SpO2: 100%    Complications: No apparent anesthesia complications

## 2023-08-30 ENCOUNTER — Ambulatory Visit (HOSPITAL_BASED_OUTPATIENT_CLINIC_OR_DEPARTMENT_OTHER): Payer: 59 | Admitting: Family

## 2023-08-30 ENCOUNTER — Encounter (HOSPITAL_COMMUNITY): Payer: Self-pay | Admitting: Internal Medicine

## 2023-09-01 NOTE — Anesthesia Postprocedure Evaluation (Signed)
Anesthesia Post Note  Patient: [name]  Procedure(s) Performed: COLONOSCOPY WITH PROPOFOL     Patient location during evaluation: Endoscopy Anesthesia Type: MAC Level of consciousness: awake and alert Pain management: pain level controlled Vital Signs Assessment: post-procedure vital signs reviewed and stable Respiratory status: spontaneous breathing Cardiovascular status: stable Anesthetic complications: no   No notable events documented.  Last Vitals:  Vitals:   08/29/23 1030 08/29/23 1040  BP: 112/75 (!) 162/56  Pulse: 86 88  Resp: 20 16  Temp:    SpO2: 100% 100%    Last Pain:  Vitals:   08/29/23 1020  TempSrc: Temporal  PainSc: 4                  Danetra Glock [employer]

## 2023-10-11 ENCOUNTER — Inpatient Hospital Stay (HOSPITAL_COMMUNITY)
Admission: EM | Admit: 2023-10-11 | Discharge: 2023-10-14 | DRG: 603 | Disposition: A | Discharge status: Home Health | Payer: 59 | Source: Skilled Nursing Facility | Attending: Internal Medicine | Admitting: Internal Medicine

## 2023-10-11 ENCOUNTER — Other Ambulatory Visit: Payer: Self-pay

## 2023-10-11 ENCOUNTER — Encounter (HOSPITAL_COMMUNITY): Payer: Self-pay | Admitting: Emergency Medicine

## 2023-10-11 DIAGNOSIS — Z794 Long term (current) use of insulin: Secondary | ICD-10-CM

## 2023-10-11 DIAGNOSIS — L03115 Cellulitis of right lower limb: Secondary | ICD-10-CM | POA: Diagnosis not present

## 2023-10-11 DIAGNOSIS — Z89512 Acquired absence of left leg below knee: Secondary | ICD-10-CM

## 2023-10-11 DIAGNOSIS — E669 Obesity, unspecified: Secondary | ICD-10-CM | POA: Diagnosis present

## 2023-10-11 DIAGNOSIS — Z9049 Acquired absence of other specified parts of digestive tract: Secondary | ICD-10-CM

## 2023-10-11 DIAGNOSIS — L0390 Cellulitis, unspecified: Secondary | ICD-10-CM | POA: Diagnosis not present

## 2023-10-11 DIAGNOSIS — Z6833 Body mass index (BMI) 33.0-33.9, adult: Secondary | ICD-10-CM

## 2023-10-11 DIAGNOSIS — D509 Iron deficiency anemia, unspecified: Secondary | ICD-10-CM | POA: Diagnosis present

## 2023-10-11 DIAGNOSIS — Z820 Family history of epilepsy and other diseases of the nervous system: Secondary | ICD-10-CM

## 2023-10-11 DIAGNOSIS — Z85038 Personal history of other malignant neoplasm of large intestine: Secondary | ICD-10-CM

## 2023-10-11 DIAGNOSIS — I5032 Chronic diastolic (congestive) heart failure: Secondary | ICD-10-CM | POA: Diagnosis present

## 2023-10-11 DIAGNOSIS — N401 Enlarged prostate with lower urinary tract symptoms: Secondary | ICD-10-CM | POA: Diagnosis present

## 2023-10-11 DIAGNOSIS — Z5919 Other inadequate housing: Secondary | ICD-10-CM

## 2023-10-11 DIAGNOSIS — K5909 Other constipation: Secondary | ICD-10-CM | POA: Diagnosis present

## 2023-10-11 DIAGNOSIS — Z7901 Long term (current) use of anticoagulants: Secondary | ICD-10-CM

## 2023-10-11 DIAGNOSIS — Z808 Family history of malignant neoplasm of other organs or systems: Secondary | ICD-10-CM

## 2023-10-11 DIAGNOSIS — E785 Hyperlipidemia, unspecified: Secondary | ICD-10-CM | POA: Diagnosis present

## 2023-10-11 DIAGNOSIS — E119 Type 2 diabetes mellitus without complications: Secondary | ICD-10-CM | POA: Diagnosis present

## 2023-10-11 DIAGNOSIS — Z8616 Personal history of COVID-19: Secondary | ICD-10-CM

## 2023-10-11 DIAGNOSIS — I110 Hypertensive heart disease with heart failure: Secondary | ICD-10-CM | POA: Diagnosis present

## 2023-10-11 DIAGNOSIS — Z8249 Family history of ischemic heart disease and other diseases of the circulatory system: Secondary | ICD-10-CM

## 2023-10-11 DIAGNOSIS — Z7984 Long term (current) use of oral hypoglycemic drugs: Secondary | ICD-10-CM

## 2023-10-11 DIAGNOSIS — I890 Lymphedema, not elsewhere classified: Secondary | ICD-10-CM | POA: Diagnosis present

## 2023-10-11 DIAGNOSIS — I482 Chronic atrial fibrillation, unspecified: Secondary | ICD-10-CM | POA: Diagnosis present

## 2023-10-11 DIAGNOSIS — Z87891 Personal history of nicotine dependence: Secondary | ICD-10-CM

## 2023-10-11 DIAGNOSIS — B888 Other specified infestations: Secondary | ICD-10-CM | POA: Diagnosis present

## 2023-10-11 DIAGNOSIS — Z79899 Other long term (current) drug therapy: Secondary | ICD-10-CM

## 2023-10-11 DIAGNOSIS — Z87442 Personal history of urinary calculi: Secondary | ICD-10-CM

## 2023-10-11 DIAGNOSIS — E872 Acidosis, unspecified: Secondary | ICD-10-CM | POA: Diagnosis present

## 2023-10-11 LAB — CBC WITH DIFFERENTIAL/PLATELET
Abs Immature Granulocytes: 0.02 10*3/uL (ref 0.00–0.07)
Basophils Absolute: 0 10*3/uL (ref 0.0–0.1)
Basophils Relative: 0 %
Eosinophils Absolute: 0.2 10*3/uL (ref 0.0–0.5)
Eosinophils Relative: 3 %
HCT: 32.6 % — ABNORMAL LOW (ref 39.0–52.0)
Hemoglobin: 11 g/dL — ABNORMAL LOW (ref 13.0–17.0)
Immature Granulocytes: 0 %
Lymphocytes Relative: 9 %
Lymphs Abs: 0.6 10*3/uL — ABNORMAL LOW (ref 0.7–4.0)
MCH: 36.1 pg — ABNORMAL HIGH (ref 26.0–34.0)
MCHC: 33.7 g/dL (ref 30.0–36.0)
MCV: 106.9 fL — ABNORMAL HIGH (ref 80.0–100.0)
Monocytes Absolute: 0.2 10*3/uL (ref 0.1–1.0)
Monocytes Relative: 4 %
Neutro Abs: 5.5 10*3/uL (ref 1.7–7.7)
Neutrophils Relative %: 84 %
Platelets: 201 10*3/uL (ref 150–400)
RBC: 3.05 MIL/uL — ABNORMAL LOW (ref 4.22–5.81)
RDW: 14.2 % (ref 11.5–15.5)
WBC: 6.5 10*3/uL (ref 4.0–10.5)
nRBC: 0 % (ref 0.0–0.2)

## 2023-10-11 LAB — BASIC METABOLIC PANEL
Anion gap: 11 (ref 5–15)
BUN: 29 mg/dL — ABNORMAL HIGH (ref 8–23)
CO2: 24 mmol/L (ref 22–32)
Calcium: 8.7 mg/dL — ABNORMAL LOW (ref 8.9–10.3)
Chloride: 98 mmol/L (ref 98–111)
Creatinine, Ser: 1.17 mg/dL (ref 0.61–1.24)
GFR, Estimated: >60 mL/min (ref >60)
Glucose, Bld: 136 mg/dL — ABNORMAL HIGH (ref 70–99)
Potassium: 4.2 mmol/L (ref 3.5–5.1)
Sodium: 133 mmol/L — ABNORMAL LOW (ref 135–145)

## 2023-10-11 LAB — GLUCOSE, CAPILLARY
Glucose-Capillary: 167 mg/dL — ABNORMAL HIGH (ref 70–99)
Glucose-Capillary: 232 mg/dL — ABNORMAL HIGH (ref 70–99)
Glucose-Capillary: 158 mg/dL — ABNORMAL HIGH (ref 70–99)

## 2023-10-11 LAB — HEMOGLOBIN A1C
Hgb A1c MFr Bld: 7.3 % — ABNORMAL HIGH (ref 4.8–5.6)
Mean Plasma Glucose: 162.81 mg/dL

## 2023-10-11 LAB — I-STAT CG4 LACTIC ACID, ED: Lactic Acid, Venous: 2.2 mmol/L (ref 0.5–1.9)

## 2023-10-11 LAB — LACTIC ACID, PLASMA: Lactic Acid, Venous: 1.1 mmol/L (ref 0.5–1.9)

## 2023-10-11 MED ORDER — TRAZODONE HCL 50 MG PO TABS
25.0000 mg | ORAL_TABLET | Freq: Every evening | ORAL | Status: DC | PRN
  Administered 2023-10-11 – 2023-10-13 (×3): 25 mg via ORAL
  Filled 2023-10-11 (×3): qty 1

## 2023-10-11 MED ORDER — SODIUM CHLORIDE 0.9 % IV BOLUS
1000.0000 mL | Freq: Once | INTRAVENOUS | Status: AC
  Administered 2023-10-11: 1000 mL via INTRAVENOUS

## 2023-10-11 MED ORDER — ALBUTEROL SULFATE (2.5 MG/3ML) 0.083% IN NEBU: 2.5000 mg | INHALATION_SOLUTION | RESPIRATORY_TRACT | Status: DC | PRN

## 2023-10-11 MED ORDER — CEFAZOLIN SODIUM-DEXTROSE 2-4 GM/100ML-% IV SOLN
2.0000 g | Freq: Once | INTRAVENOUS | Status: AC
  Administered 2023-10-11: 2 g via INTRAVENOUS
  Filled 2023-10-11: qty 100

## 2023-10-11 MED ORDER — ONDANSETRON HCL 4 MG PO TABS: 4.0000 mg | ORAL_TABLET | Freq: Four times a day (QID) | ORAL | Status: DC | PRN

## 2023-10-11 MED ORDER — INSULIN ASPART 100 UNIT/ML IJ SOLN
0.0000 [IU] | Freq: Three times a day (TID) | INTRAMUSCULAR | Status: DC
Start: 2023-10-11 — End: 2023-10-15
  Administered 2023-10-11: 5 [IU] via SUBCUTANEOUS
  Administered 2023-10-12 – 2023-10-13 (×6): 3 [IU] via SUBCUTANEOUS
  Administered 2023-10-14: 5 [IU] via SUBCUTANEOUS
  Administered 2023-10-14 (×2): 3 [IU] via SUBCUTANEOUS
  Filled 2023-10-11: qty 0.15

## 2023-10-11 MED ORDER — TAMSULOSIN HCL 0.4 MG PO CAPS
0.4000 mg | ORAL_CAPSULE | Freq: Every evening | ORAL | Status: DC
  Administered 2023-10-11 – 2023-10-14 (×4): 0.4 mg via ORAL
  Filled 2023-10-11 (×4): qty 1

## 2023-10-11 MED ORDER — HYDROCODONE-ACETAMINOPHEN 5-325 MG PO TABS
1.0000 | ORAL_TABLET | ORAL | Status: DC | PRN
  Administered 2023-10-11: 1 via ORAL
  Administered 2023-10-11 – 2023-10-13 (×5): 2 via ORAL
  Filled 2023-10-11: qty 1
  Filled 2023-10-11 (×4): qty 2
  Filled 2023-10-11 (×2): qty 1

## 2023-10-11 MED ORDER — HYDROXYZINE HCL 25 MG PO TABS
25.0000 mg | ORAL_TABLET | Freq: Three times a day (TID) | ORAL | Status: DC | PRN
  Administered 2023-10-11 – 2023-10-13 (×2): 25 mg via ORAL
  Filled 2023-10-11 (×2): qty 1

## 2023-10-11 MED ORDER — INSULIN ASPART 100 UNIT/ML IJ SOLN
0.0000 [IU] | Freq: Every day | INTRAMUSCULAR | Status: DC
Start: 2023-10-11 — End: 2023-10-15
  Filled 2023-10-11: qty 0.05

## 2023-10-11 MED ORDER — FUROSEMIDE 40 MG PO TABS
80.0000 mg | ORAL_TABLET | Freq: Every day | ORAL | Status: DC
  Administered 2023-10-11 – 2023-10-14 (×4): 80 mg via ORAL
  Filled 2023-10-11 (×4): qty 2

## 2023-10-11 MED ORDER — ATORVASTATIN CALCIUM 10 MG PO TABS
20.0000 mg | ORAL_TABLET | Freq: Every evening | ORAL | Status: DC
  Administered 2023-10-11 – 2023-10-14 (×4): 20 mg via ORAL
  Filled 2023-10-11 (×4): qty 2

## 2023-10-11 MED ORDER — ACETAMINOPHEN 325 MG PO TABS
650.0000 mg | ORAL_TABLET | Freq: Four times a day (QID) | ORAL | Status: DC | PRN
  Administered 2023-10-12: 650 mg via ORAL
  Filled 2023-10-11: qty 2

## 2023-10-11 MED ORDER — ACETAMINOPHEN 650 MG RE SUPP: 650.0000 mg | Freq: Four times a day (QID) | RECTAL | Status: DC | PRN

## 2023-10-11 MED ORDER — POTASSIUM CHLORIDE CRYS ER 20 MEQ PO TBCR
20.0000 meq | EXTENDED_RELEASE_TABLET | Freq: Every day | ORAL | Status: DC
  Administered 2023-10-11 – 2023-10-14 (×4): 20 meq via ORAL
  Filled 2023-10-11 (×4): qty 1

## 2023-10-11 MED ORDER — CEFAZOLIN SODIUM-DEXTROSE 2-4 GM/100ML-% IV SOLN
2.0000 g | Freq: Three times a day (TID) | INTRAVENOUS | Status: DC
  Administered 2023-10-11 – 2023-10-13 (×6): 2 g via INTRAVENOUS
  Filled 2023-10-11 (×7): qty 100

## 2023-10-11 MED ORDER — FERROUS SULFATE 325 (65 FE) MG PO TABS
325.0000 mg | ORAL_TABLET | Freq: Every day | ORAL | Status: DC
  Administered 2023-10-11 – 2023-10-14 (×4): 325 mg via ORAL
  Filled 2023-10-11 (×4): qty 1

## 2023-10-11 MED ORDER — APIXABAN 5 MG PO TABS
5.0000 mg | ORAL_TABLET | Freq: Two times a day (BID) | ORAL | Status: DC
  Administered 2023-10-11 – 2023-10-14 (×7): 5 mg via ORAL
  Filled 2023-10-11 (×7): qty 1

## 2023-10-11 MED ORDER — ONDANSETRON HCL 4 MG/2ML IJ SOLN: 4.0000 mg | Freq: Four times a day (QID) | INTRAMUSCULAR | Status: DC | PRN

## 2023-10-11 NOTE — ED Triage Notes (Signed)
  Patient BIB EMS for R lower leg wound that has been going on for a couple weeks.  Patient states he has lymphedema and RLL has swollen with fluid that has caused the skin to break open.  Patient states he has had extensive fluid loss from wound and states pain in 3/10.

## 2023-10-11 NOTE — ED Provider Notes (Addendum)
[place] EMERGENCY DEPARTMENT AT [facility] Provider Note  CSN: [id number] Arrival date & time: 10/11/23 7829  Chief Complaint(s) Wound Infection  HPI [name] is a 72 y.o. male with a past medical history listed below including diabetes, chronic diastolic heart failure who presents to the emergency department for several days of right lower extremity swelling and redness.  Patient reports that for the past week he has been sleeping in his wheelchair with his legs down.  He has developed a wound on the posterior aspect of his leg that likely triggered the redness.  Patient has been sleeping in a wheelchair due to infestation of bedbugs at the skilled nursing facility.  He denies any falls or trauma.  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   Cataract    Constipation, chronic 11/09/2016   Diabetes mellitus without complication (HCC)    History of COVID-19 09/09/2022   History of homelessness 09/09/2022   History of kidney stones 09/09/2022   History of Stercoral ulcer of rectum 11/28/2016   Hyperlipidemia    Hypertension    Lichen planus    bilateral legs   Nausea & vomiting 09/09/2022   Obesity (BMI 30-39.9) 09/09/2022   Personal history of cecal colon cancer 09/09/2022   Patient Active Problem List   Diagnosis Date Noted   Osteomyelitis of left foot (HCC) 10/30/2022   Cellulitis of left foot 10/27/2022   IDA (iron deficiency anemia) 09/10/2022   Chronic diastolic CHF (congestive heart failure) (HCC) 09/10/2022   Chronic anticoagulation 09/09/2022   History of colon cancer 09/09/2022   Anemia of chronic disease 09/09/2022   Venous stasis ulcers- BLE 09/09/2022   Acute cholecystitis due to biliary calculus 09/09/2022   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   Subacute eczematous &  allergic contact dermatitis  09/09/2022   History of COVID-19 09/09/2022   History of homelessness 09/09/2022   Diverticulosis of colon 09/09/2022   Cholelithiasis 09/09/2022   Hepatic steatosis 09/09/2022   Living in assisted living 09/09/2022   Pain in right foot 05/06/2022   Atrial fibrillation with slow ventricular response (HCC) 12/23/2021   Pain of joint of left ankle and foot 02/26/2021   BPH (benign prostatic hyperplasia) 11/30/2016   Abnormal CT scan, colon    AKI (acute kidney injury) (HCC)    Generalized weakness    Abdominal pain    Moderate protein-calorie malnutrition (HCC) 11/10/2016   Constipation, chronic 11/09/2016   CKD (chronic kidney disease) stage 3, GFR 30-59 ml/min (HCC) 11/09/2016   Hyperbilirubinemia 11/09/2016   Hyponatremia 11/08/2016   RBBB 09/13/2013   Snoring 09/13/2013   Edema 09/07/2013   DM2 (diabetes mellitus, type 2) (HCC) 03/16/2013   Arthritis 03/16/2013   Essential hypertension, benign 02/23/2013   Morbid obesity (HCC) 02/23/2013   Paroxysmal atrial fibrillation (HCC) 02/23/2013   HLD (hyperlipidemia) 02/23/2013   Home Medication(s) Prior to Admission medications   Medication Sig Start Date End Date Taking? Authorizing Provider  apixaban (ELIQUIS) 5 MG TABS tablet Take 1 tablet (5 mg total) by mouth 2 (two) times daily. 02/02/22   Alver Sorrow, NP  atorvastatin (LIPITOR) 20 MG tablet Take 20 mg by mouth daily. 02/02/21   [provider]  docusate sodium (COLACE) 100 MG capsule Take 1 capsule (100 mg total) by mouth 2 (two) times daily. 12/08/16  Vassie Loll, MD  ergocalciferol (VITAMIN D2) 1.25 MG (50000 UT) capsule Take 50,000 Units by mouth once a week.    [provider]  ferrous sulfate 325 (65 FE) MG EC tablet Take 325 mg by mouth daily.    [provider]  fluocinolone 0.01 % cream Apply topically 2 (two) times daily. For 1 month 05/04/23   [provider]  furosemide (LASIX) 80 MG tablet  Take 80 mg by mouth daily. 08/26/22   [provider]  GVOKE HYPOPEN 2-PACK 1 MG/0.2ML SOAJ Inject into the skin as needed (for emergency hypoglycemia). 05/04/23   [provider]  hydrOXYzine (ATARAX/VISTARIL) 25 MG tablet Take 1 tablet (25 mg total) by mouth every 8 (eight) hours as needed for itching. Use caution as this medication may make you drowsy Patient taking differently: Take 25 mg by mouth every 8 (eight) hours as needed for itching ("Use caution as this medication may make you drowsy"). 07/17/20   Little, Ambrose Finland, MD  metFORMIN (GLUCOPHAGE) 1000 MG tablet Take 1,000 mg by mouth 2 (two) times daily with a meal. 01/23/21   [provider]  [facility] powder Apply 1 Application topically 2 (two) times daily. 06/08/23   [provider]  polyethylene glycol (MIRALAX) packet Take 17 g by mouth daily. Hold for diarrhea Patient taking differently: Take 17 g by mouth See admin instructions. Mix 17 grams of powder into 4-8 ounces of water and drink by mouth once a day. Hold for diarrhea. 12/08/16   Vassie Loll, MD  potassium chloride SA (KLOR-CON M) 20 MEQ tablet Take 20 mEq by mouth daily. 08/26/22   [provider]  senna-docusate (SENOKOT-S) 8.6-50 MG tablet Take 1 tablet by mouth 2 (two) times daily between meals as needed for moderate constipation. 09/13/22   Almon Hercules, MD  sulfamethoxazole-trimethoprim (BACTRIM DS) 800-160 MG tablet Take 1 tablet by mouth 2 (two) times daily. 05/04/23   [provider]  tamsulosin (FLOMAX) 0.4 MG CAPS capsule Take 1 capsule (0.4 mg total) by mouth daily. 11/27/16   Vassie Loll, MD  triamcinolone (KENALOG) 0.1 % Apply 1 application  topically See admin instructions. Apply to itchy areas 2 times a day and and affected areas of the body as needed for dermatitis    [provider]                                                                                                                                     Allergies Patient has no known allergies.  Review of Systems Review of Systems As noted in HPI  Physical Exam Vital Signs  I have reviewed the triage vital signs BP (!) 146/85   Pulse 84   Temp 97.9 F (36.6 C) (Oral)   Resp 18   Ht 6\' 3"  (1.905 m)   Wt 120.2 kg   SpO2 100%   BMI 33.12 kg/m   Physical  Exam Vitals reviewed.  Constitutional:      General: He is not in acute distress.    Appearance: He is well-developed. He is not diaphoretic.  HENT:     Head: Normocephalic and atraumatic.     Right Ear: External ear normal.     Left Ear: External ear normal.     Nose: Nose normal.     Mouth/Throat:     Mouth: Mucous membranes are moist.  Eyes:     General: No scleral icterus.    Conjunctiva/sclera: Conjunctivae normal.  Neck:     Trachea: Phonation normal.  Cardiovascular:     Rate and Rhythm: Normal rate and regular rhythm.  Pulmonary:     Effort: Pulmonary effort is normal. No respiratory distress.     Breath sounds: No stridor.  Abdominal:     General: There is no distension.  Musculoskeletal:        General: Normal range of motion.     Cervical back: Normal range of motion.     Right lower leg: Tenderness present. 1+ Pitting Edema present.     Left lower leg: 1+ Pitting Edema present.       Legs:  Skin:    Findings: Erythema (from lower right leg streaking up to midthigh) and wound (to posterior right leg. healing. surounding erythema.) present.  Neurological:     Mental Status: He is alert and oriented to person, place, and time.  Psychiatric:        Behavior: Behavior normal.     ED Results and Treatments Labs (all labs ordered are listed, but only abnormal results are displayed) Labs Reviewed  BASIC METABOLIC PANEL - Abnormal; Notable for the following components:      Result Value   Sodium 133 (*)    Glucose, Bld 136 (*)    BUN 29 (*)    Calcium 8.7 (*)    All other components within normal limits  CBC WITH DIFFERENTIAL/PLATELET  - Abnormal; Notable for the following components:   RBC 3.05 (*)    Hemoglobin 11.0 (*)    HCT 32.6 (*)    MCV 106.9 (*)    MCH 36.1 (*)    Lymphs Abs 0.6 (*)    All other components within normal limits  I-STAT CG4 LACTIC ACID, ED - Abnormal; Notable for the following components:   Lactic Acid, Venous 2.2 (*)    All other components within normal limits  CULTURE, BLOOD (ROUTINE X 2)  CULTURE, BLOOD (ROUTINE X 2)                                                                                                                         EKG  EKG Interpretation Date/Time:    Ventricular Rate:    PR Interval:    QRS Duration:    QT Interval:    QTC Calculation:   R Axis:      Text Interpretation:         Radiology  No results found.  Medications Ordered in ED Medications  ceFAZolin (ANCEF) IVPB 2g/100 mL premix (0 g Intravenous Stopped 10/11/23 0636)  sodium chloride 0.9 % bolus 1,000 mL (1,000 mLs Intravenous New Bag/Given 10/11/23 0608)   Procedures Procedures  (including critical care time) Medical Decision Making / ED Course   Medical Decision Making Amount and/or Complexity of Data Reviewed Labs: ordered. Decision-making details documented in ED Course.  Risk Prescription drug management. Decision regarding hospitalization.    Workup is consistent with cellulitis of the right lower leg related to wounds.  Nonpurulent.  Weeping clear fluid likely from peripheral edema.  Doubt NSTI. Low suspicion for DVT as patient is anticoagulated.  Given the extent of the cellulitis, feel patient requires admission for IV antibiotics.    Final Clinical Impression(s) / ED Diagnoses Final diagnoses:  Cellulitis of right lower extremity    This chart was dictated using voice recognition software.  Despite best efforts to proofread,  errors can occur which can change the documentation meaning.      Nira Conn, MD 10/11/23 [phone]

## 2023-10-11 NOTE — Consult Note (Addendum)
WOC Nurse Consult Note: Reason for Consult: Consult requested for right leg.  Pt is already on systemic antibiotics for cellulitis. Right leg with generlized edema and erythemia.  He has patchy areas of dry yellow scabbed skin and also red moist partial thickness wounds scattered across anterior and posterior calf. Painful to touch and weeping mod amt yellow drainage.  Dressing procedure/placement/frequency: Topical treatment orders provided for bedside nurses to perform as follows to promote healing: Apply Xeroform to right leg wounds Q day, then cover with ABD pads and kerlex and then ace wrap in a spiral fashion, beginning just behind toes to below knee. Please re-consult if further assistance is needed.  Thank-you,  Cammie Mcgee MSN, RN, CWOCN, [place], CNS [phone]

## 2023-10-11 NOTE — Progress Notes (Signed)
Pharmacy Antibiotic Note  [name] is a 72 y.o. male admitted on 10/11/2023 with cellulitis.  Pharmacy has been consulted for cefazolin dosing.  Day #1 of abx for right lower leg non-purulent cellulitis related to wounds. Afebrile, WBC wnl.  Plan: Cefazolin 2g IV Q8h Monitor clinical picture, renal function F/U abx deescalation / LOT  Height: 6\' 3"  (190.5 cm) Weight: 120.2 kg (265 lb) IBW/kg (Calculated) : 84.5  Temp (24hrs), Avg:97.9 F (36.6 C), Min:97.9 F (36.6 C), Max:97.9 F (36.6 C)  Recent Labs  Lab 10/11/23 0353 10/11/23 0358  WBC  --  6.5  CREATININE  --  1.17  LATICACIDVEN 2.2*  --     Estimated Creatinine Clearance: 79.8 mL/min (by C-G formula based on SCr of 1.17 mg/dL).    No Known Allergies  Antimicrobials this admission: Cefazolin 10/29 >>   Dose adjustments this admission:   Microbiology results: 10/29 BCx: sent  Thank you for allowing pharmacy to be a part of this patient's care.  Armandina Stammer 10/11/2023 9:10 AM

## 2023-10-11 NOTE — ED Notes (Signed)
ED TO INPATIENT HANDOFF REPORT  Name/Age/Gender [name] 72 y.o. male  Code Status    Code Status Orders  (From admission, onward)           Start     Ordered   10/11/23 0908  Full code  Continuous       Question:  By:  Answer:  Consent: discussion documented in EHR   10/11/23 0908           Code Status History     Date Active Date Inactive Code Status Order ID Comments User Context   10/27/2022 1956 11/16/2022 2358 Full Code [id number]  Angie Fava, DO ED   09/09/2022 2027 09/13/2022 2033 Full Code [id number]  Carollee Herter, DO Inpatient   09/09/2022 1921 09/09/2022 2027 Full Code [id number]  Carollee Herter, DO ED   12/22/2021 1744 12/25/2021 2230 Full Code [id number]  Teddy Spike, DO Inpatient   12/04/2016 1112 12/08/2016 1851 Full Code [id number]  Floydene Flock, MD ED   11/08/2016 2359 11/26/2016 1939 Full Code [id number]  Jonah Blue, MD ED       Home/SNF/Other Skilled nursing facility/assisted living.  Chief Complaint Cellulitis [L03.90]  Level of Care/Admitting Diagnosis ED Disposition     ED Disposition  Admit   Condition  --   Comment  Hospital Area: [facility] [[id number]]  Level of Care: Med-Surg [16]  May place patient in observation at [facility] or [facility] if equivalent level of care is available:: Yes  Covid Evaluation: Asymptomatic - no recent exposure (last 10 days) testing not required  Diagnosis: Cellulitis [536644]  Admitting Physician: Maryln Gottron [[id number]]  Attending Physician: Kirby Crigler, MIR [email or web address] [[id number]]          Medical History Past Medical History:  Diagnosis Date   Adenocarcinoma of colon (HCC)    Atrial fibrillation (HCC)    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   Cataract    Constipation, chronic 11/09/2016   Diabetes mellitus without complication (HCC)    History of COVID-19 09/09/2022   History of homelessness 09/09/2022   History of kidney stones 09/09/2022    History of Stercoral ulcer of rectum 11/28/2016   Hyperlipidemia    Hypertension    Lichen planus    bilateral legs   Nausea & vomiting 09/09/2022   Obesity (BMI 30-39.9) 09/09/2022   Personal history of cecal colon cancer 09/09/2022    Allergies No Known Allergies  IV Location/Drains/Wounds Patient Lines/Drains/Airways Status     Active Line/Drains/Airways     Name Placement date Placement time Site Days   Peripheral IV 10/11/23 20 G Anterior;Proximal;Right Forearm 10/11/23  0440  Forearm  less than 1   Peripheral IV 10/11/23 20 G Anterior;Distal;Left Forearm 10/11/23  0850  Forearm  less than 1   Wound / Incision (Open or Dehisced) 10/11/23 Other (Comment) Leg Right cellulitis and patchy areas of partial thickness skin loss 10/11/23  --  Leg  less than 1            Labs/Imaging Results for orders placed or performed during the hospital encounter of 10/11/23 (from the past 48 hour(s))  I-Stat Lactic Acid, ED     Status: Abnormal   Collection Time: 10/11/23  3:53 AM  Result Value Ref Range   Lactic Acid, Venous 2.2 (HH) 0.5 - 1.9 mmol/L   Comment NOTIFIED PHYSICIAN   Basic metabolic panel     Status: Abnormal   Collection Time: 10/11/23  3:58 AM  Result Value Ref Range   Sodium 133 (L) 135 - 145 mmol/L   Potassium 4.2 3.5 - 5.1 mmol/L    Comment: HEMOLYSIS AT THIS LEVEL MAY AFFECT RESULT   Chloride 98 98 - 111 mmol/L   CO2 24 22 - 32 mmol/L   Glucose, Bld 136 (H) 70 - 99 mg/dL    Comment: Glucose reference range applies only to samples taken after fasting for at least 8 hours.   BUN 29 (H) 8 - 23 mg/dL   Creatinine, Ser 0.98 0.61 - 1.24 mg/dL   Calcium 8.7 (L) 8.9 - 10.3 mg/dL   GFR, Estimated >11 >91 mL/min    Comment: (NOTE) Calculated using the CKD-EPI Creatinine Equation (2021)    Anion gap 11 5 - 15    Comment: Performed at [facility], [address], [place], [place] [address]  CBC with Differential/Platelet     Status: Abnormal    Collection Time: 10/11/23  3:58 AM  Result Value Ref Range   WBC 6.5 4.0 - 10.5 K/uL   RBC 3.05 (L) 4.22 - 5.81 MIL/uL   Hemoglobin 11.0 (L) 13.0 - 17.0 g/dL   HCT 56.2 (L) 13.0 - 86.5 %   MCV 106.9 (H) 80.0 - 100.0 fL   MCH 36.1 (H) 26.0 - 34.0 pg   MCHC 33.7 30.0 - 36.0 g/dL   RDW 78.4 69.6 - 29.5 %   Platelets 201 150 - 400 K/uL   nRBC 0.0 0.0 - 0.2 %   Neutrophils Relative % 84 %   Neutro Abs 5.5 1.7 - 7.7 K/uL   Lymphocytes Relative 9 %   Lymphs Abs 0.6 (L) 0.7 - 4.0 K/uL   Monocytes Relative 4 %   Monocytes Absolute 0.2 0.1 - 1.0 K/uL   Eosinophils Relative 3 %   Eosinophils Absolute 0.2 0.0 - 0.5 K/uL   Basophils Relative 0 %   Basophils Absolute 0.0 0.0 - 0.1 K/uL   Immature Granulocytes 0 %   Abs Immature Granulocytes 0.02 0.00 - 0.07 K/uL    Comment: Performed at [facility], [address], [place], [place] [address]  Lactic acid, plasma     Status: None   Collection Time: 10/11/23 10:05 AM  Result Value Ref Range   Lactic Acid, Venous 1.1 0.5 - 1.9 mmol/L    Comment: Performed at [facility], [address], [place], [place] [address]   No results found.  Pending Labs Unresulted Labs (From admission, onward)     Start     Ordered   10/12/23 0500  Basic metabolic panel  Tomorrow morning,   R        10/11/23 0908   10/12/23 0500  CBC  Tomorrow morning,   R        10/11/23 0908   10/11/23 0908  Hemoglobin A1c  (Glycemic Control (SSI)  Q 4 Hours / Glycemic Control (SSI)  AC +/- HS)  Once,   R       Comments: To assess prior glycemic control    10/11/23 0908   10/11/23 0553  Blood culture (routine x 2)  BLOOD CULTURE X 2,   R (with STAT occurrences)      10/11/23 0552            Vitals/Pain Today's Vitals   10/11/23 0530 10/11/23 0545 10/11/23 0600 10/11/23 0615  BP: 135/87 125/62 (!) 133/117 (!) 146/85  Pulse: 81 73 81 84  Resp:  18  Temp:    97.9 F (36.6 C)  TempSrc:    Oral  SpO2: 100% 100% 100% 100%   Weight:      Height:      PainSc:        Isolation Precautions No active isolations  Medications Medications  atorvastatin (LIPITOR) tablet 20 mg (has no administration in time range)  furosemide (LASIX) tablet 80 mg (80 mg Oral Given 10/11/23 1001)  hydrOXYzine (ATARAX) tablet 25 mg (has no administration in time range)  tamsulosin (FLOMAX) capsule 0.4 mg (has no administration in time range)  apixaban (ELIQUIS) tablet 5 mg (5 mg Oral Given 10/11/23 1001)  ferrous sulfate tablet 325 mg (325 mg Oral Given 10/11/23 1001)  potassium chloride SA (KLOR-CON M) CR tablet 20 mEq (20 mEq Oral Given 10/11/23 1001)  insulin aspart (novoLOG) injection 0-15 Units (has no administration in time range)  insulin aspart (novoLOG) injection 0-5 Units (has no administration in time range)  acetaminophen (TYLENOL) tablet 650 mg (has no administration in time range)    Or  acetaminophen (TYLENOL) suppository 650 mg (has no administration in time range)  HYDROcodone-acetaminophen (NORCO/VICODIN) 5-325 MG per tablet 1-2 tablet (1 tablet Oral Given 10/11/23 1001)  traZODone (DESYREL) tablet 25 mg (has no administration in time range)  ondansetron (ZOFRAN) tablet 4 mg (has no administration in time range)    Or  ondansetron (ZOFRAN) injection 4 mg (has no administration in time range)  albuterol (PROVENTIL) (2.5 MG/3ML) 0.083% nebulizer solution 2.5 mg (has no administration in time range)  ceFAZolin (ANCEF) IVPB 2g/100 mL premix (has no administration in time range)  ceFAZolin (ANCEF) IVPB 2g/100 mL premix (0 g Intravenous Stopped 10/11/23 0636)  sodium chloride 0.9 % bolus 1,000 mL (0 mLs Intravenous Stopped 10/11/23 0740)    Mobility non-ambulatory

## 2023-10-11 NOTE — H&P (Signed)
History and Physical  [name]:[id number] DOB: 05-Jun-1951 DOA: 10/11/2023  PCP: Cristino Martes, NP   Chief Complaint: Leg swelling and pain  HPI: [name] is a 72 y.o. male with medical history significant for pretension, hyperlipidemia, obesity, non-insulin-dependent type 2 diabetes, lymphedema, atrial fibrillation on Eliquis being admitted to the hospital with right lower extremity cellulitis.  States that he was found to have bedbugs in his room at the facility, so they took away his bed and he has been sleeping in a wheelchair and still replacement but they got him is too low to the ground.  Since he has not been able to elevate his legs, his lymphedema has gotten worse.  He does have some drainage in the right leg, and some tenderness.  However he denies fevers, there is erythema but he tells me that this is chronic.  No chest pain, nausea, vomiting, chills, or any other concerns.  ED Course: In the ER he is afebrile.  Vital signs stable except for some hypertension.  Lab work including CBC and BMP unremarkable except for lactate 2.2.  He was started on empiric IV Ancef.  Review of Systems: Please see HPI for pertinent positives and negatives. A complete 10 system review of systems are otherwise negative.  Past Medical History:  Diagnosis Date   Adenocarcinoma of colon [facility])    Atrial fibrillation (HCC)    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   Cataract    Constipation, chronic 11/09/2016   Diabetes mellitus without complication (HCC)    History of COVID-19 09/09/2022   History of homelessness 09/09/2022   History of kidney stones 09/09/2022   History of Stercoral ulcer of rectum 11/28/2016   Hyperlipidemia    Hypertension    Lichen planus    bilateral legs   Nausea & vomiting 09/09/2022   Obesity (BMI 30-39.9) 09/09/2022   Personal history of cecal colon cancer 09/09/2022   Past Surgical History:  Procedure Laterality Date   AMPUTATION  Left 11/03/2022   Procedure: BELOW KNEE  AMPUTATION, LEFT;  Surgeon: Nadara Mustard, MD;  Location: [facility] OR;  Service: Orthopedics;  Laterality: Left;   APPENDECTOMY     APPLICATION OF WOUND VAC Left 11/03/2022   Procedure: APPLICATION OF WOUND VAC;  Surgeon: Nadara Mustard, MD;  Location: [facility] OR;  Service: Orthopedics;  Laterality: Left;   CHOLECYSTECTOMY N/A 09/11/2022   Procedure: LAPAROSCOPIC CHOLECYSTECTOMY;  Surgeon: Abigail Miyamoto, MD;  Location: [facility] ORS;  Service: General;  Laterality: N/A;   COLON RESECTION N/A 11/22/2016   Procedure: HAND ASSISTED LAPAROSCOPIC COLON RESECTION;  Surgeon: Avel Peace, MD;  Location: [facility];  Service: General;  Laterality: N/A;   COLONOSCOPY N/A 11/17/2016   Procedure: COLONOSCOPY;  Surgeon: Meryl Dare, MD;  Location: [facility] ENDOSCOPY;  Service: Endoscopy;  Laterality: N/A;   COLONOSCOPY WITH PROPOFOL N/A 08/29/2023   Procedure: COLONOSCOPY WITH PROPOFOL;  Surgeon: Hilarie Fredrickson, MD;  Location: [facility] ENDOSCOPY;  Service: Gastroenterology;  Laterality: N/A;   FRACTURE SURGERY     MULTIPLE TOOTH EXTRACTIONS     RECTAL EXAM UNDER ANESTHESIA N/A 12/05/2016   Procedure: RECTAL EXAM UNDER ANESTHESIA, DISIMPACTION;  Surgeon: Ovidio Kin, MD;  Location: [facility] ORS;  Service: General;  Laterality: N/A;    Social History:  reports that he quit smoking about 40 years ago. His smoking use included pipe. He has never used smokeless tobacco. He reports that he does not drink alcohol and does not use drugs.   No  Known Allergies  Family History  Problem Relation Age of Onset   Brain cancer Mother    Alzheimer's disease Father    Cancer Maternal Grandmother    Heart attack Maternal Grandfather    Pneumonia Paternal Grandfather    Colon cancer Neg Hx    Esophageal cancer Neg Hx    Stomach cancer Neg Hx      Prior to Admission medications   Medication Sig Start Date End Date Taking? Authorizing Provider  acetaminophen (TYLENOL) 325 MG tablet Take 650 mg by mouth  every 4 (four) hours as needed for moderate pain (pain score 4-6), fever or mild pain (pain score 1-3).   Yes [provider]  apixaban (ELIQUIS) 5 MG TABS tablet Take 1 tablet (5 mg total) by mouth 2 (two) times daily. 02/02/22  Yes Alver Sorrow, NP  atorvastatin (LIPITOR) 20 MG tablet Take 20 mg by mouth every evening. 02/02/21  Yes [provider]  docusate sodium (COLACE) 100 MG capsule Take 1 capsule (100 mg total) by mouth 2 (two) times daily. 12/08/16  Yes Vassie Loll, MD  ferrous sulfate (FEROSUL) 325 (65 FE) MG tablet Take 325 mg by mouth daily.   Yes [provider]  fluocinolone 0.01 % cream Apply 1 Application topically 2 (two) times daily. Apply to lower right leg 05/04/23  Yes [provider]  furosemide (LASIX) 80 MG tablet Take 80 mg by mouth daily. 08/26/22  Yes [provider]  glipiZIDE (GLUCOTROL XL) 10 MG 24 hr tablet Take 10 mg by mouth daily.   Yes [provider]  Glucagon (GVOKE HYPOPEN 1-PACK) 1 MG/0.2ML SOAJ Inject 1 mg into the skin as needed (hypoglycemia).   Yes [provider]  hydrOXYzine (ATARAX/VISTARIL) 25 MG tablet Take 1 tablet (25 mg total) by mouth every 8 (eight) hours as needed for itching. Use caution as this medication may make you drowsy Patient taking differently: Take 25 mg by mouth every 8 (eight) hours as needed for itching. 07/17/20  Yes Little, Ambrose Finland, MD  metFORMIN (GLUCOPHAGE-XR) 500 MG 24 hr tablet Take 1,000 mg by mouth in the morning and at bedtime.   Yes [provider]  [facility] powder Apply 1 Application topically 2 (two) times daily. 06/08/23  Yes [provider]  polyethylene glycol (MIRALAX) packet Take 17 g by mouth daily. Hold for diarrhea Patient taking differently: Take 17 g by mouth daily as needed for mild constipation, moderate constipation or severe constipation. 12/08/16  Yes Vassie Loll, MD  potassium chloride SA (KLOR-CON M) 20 MEQ tablet  Take 20 mEq by mouth daily. 08/26/22  Yes [provider]  senna (SENOKOT) 8.6 MG TABS tablet Take 1 tablet by mouth every 12 (twelve) hours as needed for mild constipation or moderate constipation.   Yes [provider]  tamsulosin (FLOMAX) 0.4 MG CAPS capsule Take 1 capsule (0.4 mg total) by mouth daily. Patient taking differently: Take 0.4 mg by mouth every evening. 11/27/16  Yes Vassie Loll, MD  traZODone (DESYREL) 100 MG tablet Take 100 mg by mouth at bedtime.   Yes [provider]  triamcinolone (KENALOG) 0.1 % Apply 1 Application topically as needed (rash).   Yes [provider]    Physical Exam: BP (!) 146/85   Pulse 84   Temp 97.9 F (36.6 C) (Oral)   Resp 18   Ht 6\' 3"  (1.905 m)   Wt 120.2 kg   SpO2 100%   BMI 33.12 kg/m   General:  Alert, oriented, calm, in no acute distress, pleasant and cooperative, looks comfortable and nontoxic Eyes: EOMI, clear conjuctivae, white sclerea Neck: supple, no masses, trachea mildline  Cardiovascular: RRR, no murmurs or rubs Respiratory: clear to auscultation bilaterally, no wheezes, no crackles  Abdomen: soft, nontender, obese, nondistended, normal bowel tones heard  Skin: dry, status post left BKA, he has right lower extremity significant edema, surrounding erythema, circumferential scabs/wounds, with no obvious open areas, but there is some obvious clear drainage.  No obvious areas of fluctuance, no pus seen. Musculoskeletal: no joint effusions, normal range of motion  Psychiatric: appropriate affect, normal speech  Neurologic: extraocular muscles intact, clear speech, moving all extremities with intact sensorium         Labs on Admission:  Basic Metabolic Panel: Recent Labs  Lab 10/11/23 0358  NA 133*  K 4.2  CL 98  CO2 24  GLUCOSE 136*  BUN 29*  CREATININE 1.17  CALCIUM 8.7*   Liver Function Tests: No results for input(s): "AST", "ALT", "ALKPHOS", "BILITOT", "PROT", "ALBUMIN" in the  last 168 hours. No results for input(s): "LIPASE", "AMYLASE" in the last 168 hours. No results for input(s): "AMMONIA" in the last 168 hours. CBC: Recent Labs  Lab 10/11/23 0358  WBC 6.5  NEUTROABS 5.5  HGB 11.0*  HCT 32.6*  MCV 106.9*  PLT 201   Cardiac Enzymes: No results for input(s): "CKTOTAL", "CKMB", "CKMBINDEX", "TROPONINI" in the last 168 hours.  BNP (last 3 results) Recent Labs    06/10/23 1521  BNP 77.5    ProBNP (last 3 results) No results for input(s): "PROBNP" in the last 8760 hours.  CBG: No results for input(s): "GLUCAP" in the last 168 hours.  Radiological Exams on Admission: No results found.  Assessment/Plan GRAYSYN FREEDLAND is a 72 y.o. male with medical history significant for hypertension, hyperlipidemia, obesity, non-insulin-dependent type 2 diabetes, lymphedema, atrial fibrillation on Eliquis being admitted to the hospital with right lower extremity cellulitis.    Right lower extremity cellulitis-due to worsening I suspect this is likely lower extremity edema/lymphedema due to not being able to elevate his legs.  This has resulted in some superficial skin tears leading to likely cellulitis.  Not meeting sepsis criteria. -Observation admission -Elevate the right lower extremity -Wound care consultation -Empiric IV Ancef, can likely transition to oral antibiotics soon  Non-insulin-dependent type 2 diabetes-check A1c, carb controlled diet, moderate sliding scale  Lactic acidosis-likely due to poor perfusion of the right lower extremity given significant lymphedema.  Not meeting SIRS/sepsis criteria.  Received 1 L fluid bolus in the ER, will recheck lactate.  Hyperlipidemia-Lipitor  Iron deficiency anemia-continue ferrous sulfate  Chronic diastolic congestive heart failure-continue Lasix, with potassium supplementation.  Patient appears to be well compensated.  Atrial fibrillation-currently in sinus rhythm, continue Eliquis  DVT prophylaxis:  Eliquis    Code Status: Full Code  Consults called: None  Admission status: Observation  Time spent: 59 minutes  Braya Habermehl Sharlette Dense MD [facility] Pager [phone]  If 7PM-7AM, please contact night-coverage [email or web address] Password [email or web address]  10/11/2023, 9:08 AM

## 2023-10-11 NOTE — ED Notes (Signed)
Pt reports to nursing staff he has bed bugs and he has had them all in his mattress but has been sleeping in his recliner lately. Pt states he has not had as many bed bugs on his person recently. Bed bug found on the pt shoe and placed in specimen container. Kathryne Hitch, Charge Nurse aware at this time.

## 2023-10-11 NOTE — Plan of Care (Signed)
  Problem: Education: Goal: Ability to describe self-care measures that may prevent or decrease complications (Diabetes Survival Skills Education) will improve Outcome: Progressing Goal: Individualized Educational Video(s) Outcome: Progressing   Problem: Coping: Goal: Ability to adjust to condition or change in health will improve Outcome: Progressing   Problem: Fluid Volume: Goal: Ability to maintain a balanced intake and output will improve Outcome: Progressing   Problem: Health Behavior/Discharge Planning: Goal: Ability to identify and utilize available resources and services will improve Outcome: Progressing Goal: Ability to manage health-related needs will improve Outcome: Progressing   Problem: Metabolic: Goal: Ability to maintain appropriate glucose levels will improve Outcome: Progressing   Problem: Nutritional: Goal: Maintenance of adequate nutrition will improve Outcome: Progressing Goal: Progress toward achieving an optimal weight will improve Outcome: Progressing   Problem: Skin Integrity: Goal: Risk for impaired skin integrity will decrease Outcome: Progressing   Problem: Tissue Perfusion: Goal: Adequacy of tissue perfusion will improve Outcome: Progressing   Problem: Education: Goal: Knowledge of General Education information will improve Description: Including pain rating scale, medication(s)/side effects and non-pharmacologic comfort measures Outcome: Progressing   Problem: Health Behavior/Discharge Planning: Goal: Ability to manage health-related needs will improve Outcome: Progressing   Problem: Clinical Measurements: Goal: Ability to maintain clinical measurements within normal limits will improve Outcome: Progressing Goal: Will remain free from infection Outcome: Progressing Goal: Diagnostic test results will improve Outcome: Progressing Goal: Respiratory complications will improve Outcome: Progressing Goal: Cardiovascular complication will  be avoided Outcome: Progressing   Problem: Activity: Goal: Risk for activity intolerance will decrease Outcome: Progressing   Problem: Nutrition: Goal: Adequate nutrition will be maintained Outcome: Progressing   Problem: Coping: Goal: Level of anxiety will decrease Outcome: Progressing   Problem: Elimination: Goal: Will not experience complications related to bowel motility Outcome: Progressing Goal: Will not experience complications related to urinary retention Outcome: Progressing   Problem: Pain Management: Goal: General experience of comfort will improve Outcome: Progressing   Problem: Safety: Goal: Ability to remain free from injury will improve Outcome: Progressing   Problem: Skin Integrity: Goal: Risk for impaired skin integrity will decrease Outcome: Progressing   Problem: Clinical Measurements: Goal: Ability to avoid or minimize complications of infection will improve Outcome: Progressing   Problem: Skin Integrity: Goal: Skin integrity will improve Outcome: Progressing

## 2023-10-12 DIAGNOSIS — L03115 Cellulitis of right lower limb: Secondary | ICD-10-CM | POA: Diagnosis present

## 2023-10-12 DIAGNOSIS — I110 Hypertensive heart disease with heart failure: Secondary | ICD-10-CM | POA: Diagnosis present

## 2023-10-12 DIAGNOSIS — I890 Lymphedema, not elsewhere classified: Secondary | ICD-10-CM | POA: Diagnosis present

## 2023-10-12 DIAGNOSIS — E785 Hyperlipidemia, unspecified: Secondary | ICD-10-CM | POA: Diagnosis present

## 2023-10-12 DIAGNOSIS — Z79899 Other long term (current) drug therapy: Secondary | ICD-10-CM | POA: Diagnosis not present

## 2023-10-12 DIAGNOSIS — Z8249 Family history of ischemic heart disease and other diseases of the circulatory system: Secondary | ICD-10-CM | POA: Diagnosis not present

## 2023-10-12 DIAGNOSIS — E669 Obesity, unspecified: Secondary | ICD-10-CM | POA: Diagnosis present

## 2023-10-12 DIAGNOSIS — N401 Enlarged prostate with lower urinary tract symptoms: Secondary | ICD-10-CM | POA: Diagnosis present

## 2023-10-12 DIAGNOSIS — E119 Type 2 diabetes mellitus without complications: Secondary | ICD-10-CM | POA: Diagnosis present

## 2023-10-12 DIAGNOSIS — D509 Iron deficiency anemia, unspecified: Secondary | ICD-10-CM | POA: Diagnosis present

## 2023-10-12 DIAGNOSIS — Z85038 Personal history of other malignant neoplasm of large intestine: Secondary | ICD-10-CM | POA: Diagnosis not present

## 2023-10-12 DIAGNOSIS — L0390 Cellulitis, unspecified: Secondary | ICD-10-CM | POA: Diagnosis present

## 2023-10-12 DIAGNOSIS — Z6833 Body mass index (BMI) 33.0-33.9, adult: Secondary | ICD-10-CM | POA: Diagnosis not present

## 2023-10-12 DIAGNOSIS — Z794 Long term (current) use of insulin: Secondary | ICD-10-CM | POA: Diagnosis not present

## 2023-10-12 DIAGNOSIS — I5032 Chronic diastolic (congestive) heart failure: Secondary | ICD-10-CM | POA: Diagnosis present

## 2023-10-12 DIAGNOSIS — Z87891 Personal history of nicotine dependence: Secondary | ICD-10-CM | POA: Diagnosis not present

## 2023-10-12 DIAGNOSIS — Z8616 Personal history of COVID-19: Secondary | ICD-10-CM | POA: Diagnosis not present

## 2023-10-12 DIAGNOSIS — E872 Acidosis, unspecified: Secondary | ICD-10-CM | POA: Diagnosis present

## 2023-10-12 DIAGNOSIS — Z7901 Long term (current) use of anticoagulants: Secondary | ICD-10-CM | POA: Diagnosis not present

## 2023-10-12 DIAGNOSIS — B888 Other specified infestations: Secondary | ICD-10-CM | POA: Diagnosis present

## 2023-10-12 DIAGNOSIS — Z5919 Other inadequate housing: Secondary | ICD-10-CM | POA: Diagnosis not present

## 2023-10-12 DIAGNOSIS — Z89512 Acquired absence of left leg below knee: Secondary | ICD-10-CM | POA: Diagnosis not present

## 2023-10-12 DIAGNOSIS — I482 Chronic atrial fibrillation, unspecified: Secondary | ICD-10-CM | POA: Diagnosis present

## 2023-10-12 DIAGNOSIS — K5909 Other constipation: Secondary | ICD-10-CM | POA: Diagnosis present

## 2023-10-12 DIAGNOSIS — Z7984 Long term (current) use of oral hypoglycemic drugs: Secondary | ICD-10-CM | POA: Diagnosis not present

## 2023-10-12 LAB — CBC
HCT: 32.7 % — ABNORMAL LOW (ref 39.0–52.0)
Hemoglobin: 10.6 g/dL — ABNORMAL LOW (ref 13.0–17.0)
MCH: 35 pg — ABNORMAL HIGH (ref 26.0–34.0)
MCHC: 32.4 g/dL (ref 30.0–36.0)
MCV: 107.9 fL — ABNORMAL HIGH (ref 80.0–100.0)
Platelets: 192 10*3/uL (ref 150–400)
RBC: 3.03 MIL/uL — ABNORMAL LOW (ref 4.22–5.81)
RDW: 14.4 % (ref 11.5–15.5)
WBC: 4.5 10*3/uL (ref 4.0–10.5)
nRBC: 0 % (ref 0.0–0.2)

## 2023-10-12 LAB — BASIC METABOLIC PANEL
Anion gap: 13 (ref 5–15)
BUN: 21 mg/dL (ref 8–23)
CO2: 26 mmol/L (ref 22–32)
Calcium: 8.4 mg/dL — ABNORMAL LOW (ref 8.9–10.3)
Chloride: 95 mmol/L — ABNORMAL LOW (ref 98–111)
Creatinine, Ser: 1.26 mg/dL — ABNORMAL HIGH (ref 0.61–1.24)
GFR, Estimated: >60 mL/min (ref >60)
Glucose, Bld: 169 mg/dL — ABNORMAL HIGH (ref 70–99)
Potassium: 3.5 mmol/L (ref 3.5–5.1)
Sodium: 134 mmol/L — ABNORMAL LOW (ref 135–145)

## 2023-10-12 LAB — GLUCOSE, CAPILLARY
Glucose-Capillary: 154 mg/dL — ABNORMAL HIGH (ref 70–99)
Glucose-Capillary: 195 mg/dL — ABNORMAL HIGH (ref 70–99)
Glucose-Capillary: 151 mg/dL — ABNORMAL HIGH (ref 70–99)
Glucose-Capillary: 185 mg/dL — ABNORMAL HIGH (ref 70–99)

## 2023-10-12 NOTE — Plan of Care (Signed)

## 2023-10-12 NOTE — Plan of Care (Signed)
  Problem: Fluid Volume: Goal: Ability to maintain a balanced intake and output will improve Outcome: Progressing   Problem: Metabolic: Goal: Ability to maintain appropriate glucose levels will improve Outcome: Progressing   Problem: Nutritional: Goal: Maintenance of adequate nutrition will improve Outcome: Progressing   Problem: Skin Integrity: Goal: Risk for impaired skin integrity will decrease Outcome: Progressing   Problem: Education: Goal: Knowledge of General Education information will improve Description: Including pain rating scale, medication(s)/side effects and non-pharmacologic comfort measures Outcome: Progressing   Problem: Clinical Measurements: Goal: Will remain free from infection Outcome: Progressing Goal: Diagnostic test results will improve Outcome: Progressing   Problem: Skin Integrity: Goal: Skin integrity will improve Outcome: Progressing

## 2023-10-12 NOTE — TOC Initial Note (Addendum)
Transition of Care [facility]) - Initial/Assessment Note    Patient Details  Name: [name] MRN: [id number] Date of Birth: Dec 03, 1951  Transition of Care [facility]) CM/SW Contact:    Otelia Santee, LCSW Phone Number: 10/12/2023, 3:03 PM  Clinical Narrative:                 Pt from [facility]. Pt coming in from facility with bed bugs found on clothing. Per pt he had bed bugs in his mattress at the facility. Pt shares that they removed this mattress and provided him with a new mattress 9 days ago however, the new mattress is to low to the ground and he is unable to transfer to his wheelchair. Pt states he has been sleeping in his wheelchair while this issue is being resolved.  Pt spoke to Hydia at [facility] who shares that they have put in a request for a special order mattress and they are currently waiting for it to be delivered. She states they expect it to be delivered in the next day or two. She shares that pt is able to stay in current bed at facility as they are able ot assist him out of bed and with transfers. CSW also discussed recommendation for wound care. Hydia is agreeable with HHRN being arranged for pt however, states pt is on several agencies do not accept list due to inappropriate behaviors with staff.    CSW has contacted the following agencies: -[facility]: unable to accept due to behaviors - [facility]: unable to accept due to behaviors -[facility]: unable to accept due to behaviors -[facility]: Unable to accept pt's insurance -[facility]: Unable to accept pt's insurance -[facility]: No response -[facility]: Unable to accept pt's insurance -[facility]: Unable to accept.   Expected Discharge Plan: Assisted Living Barriers to Discharge: No Barriers Identified   Patient Goals and CMS Choice Patient states their goals for this hospitalization and ongoing recovery are:: To return to [employer].gov Compare Post Acute Care list provided to:: Patient Choice  offered to / list presented to : Patient Maxeys ownership interest in [facility].provided to::  (NA)    Expected Discharge Plan and Services In-house Referral: Clinical Social Work Discharge Planning Services: NA Post Acute Care Choice: Home Health Living arrangements for the past 2 months: Assisted Living Facility                 DME Arranged: N/A DME Agency: NA       HH Arranged: RN          Prior Living Arrangements/Services Living arrangements for the past 2 months: Assisted Living Facility Lives with:: Facility Resident Patient language and need for interpreter reviewed:: Yes Do you feel safe going back to the place where you live?: Yes      Need for Family Participation in Patient Care: No (Comment) Care giver support system in place?: Yes (comment)   Criminal Activity/Legal Involvement Pertinent to Current Situation/Hospitalization: No - Comment as needed  Activities of Daily Living   ADL Screening (condition at time of admission) Independently performs ADLs?: Yes (appropriate for developmental age) Is the patient deaf or have difficulty hearing?: No Does the patient have difficulty seeing, even when wearing glasses/contacts?: No Does the patient have difficulty concentrating, remembering, or making decisions?: No  Permission Sought/Granted Permission sought to share information with : Facility Industrial/product designer granted to share information with : Yes, Verbal Permission Granted     Permission granted to share  info w AGENCY: [facility]; HHA's        Emotional Assessment Appearance::  (Spoke via t/c) Attitude/Demeanor/Rapport: Engaged Affect (typically observed): Accepting Orientation: : Oriented to Self, Oriented to Place, Oriented to  Time, Oriented to Situation Alcohol / Substance Use: Not Applicable Psych Involvement: No (comment)  Admission diagnosis:  Cellulitis [L03.90] Cellulitis of right lower extremity  [L03.115] Patient Active Problem List   Diagnosis Date Noted   Cellulitis 10/11/2023   Osteomyelitis of left foot (HCC) 10/30/2022   Cellulitis of left foot 10/27/2022   IDA (iron deficiency anemia) 09/10/2022   Chronic diastolic CHF (congestive heart failure) (HCC) 09/10/2022   Chronic anticoagulation 09/09/2022   History of colon cancer 09/09/2022   Anemia of chronic disease 09/09/2022   Venous stasis ulcers- BLE 09/09/2022   Acute cholecystitis due to biliary calculus 09/09/2022   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   Subacute eczematous & allergic contact dermatitis  09/09/2022   History of COVID-19 09/09/2022   History of homelessness 09/09/2022   Diverticulosis of colon 09/09/2022   Cholelithiasis 09/09/2022   Hepatic steatosis 09/09/2022   Living in assisted living 09/09/2022   Pain in right foot 05/06/2022   Atrial fibrillation with slow ventricular response (HCC) 12/23/2021   Pain of joint of left ankle and foot 02/26/2021   BPH (benign prostatic hyperplasia) 11/30/2016   Abnormal CT scan, colon    AKI (acute kidney injury) (HCC)    Generalized weakness    Abdominal pain    Moderate protein-calorie malnutrition (HCC) 11/10/2016   Constipation, chronic 11/09/2016   CKD (chronic kidney disease) stage 3, GFR 30-59 ml/min (HCC) 11/09/2016   Hyperbilirubinemia 11/09/2016   Hyponatremia 11/08/2016   RBBB 09/13/2013   Snoring 09/13/2013   Edema 09/07/2013   DM2 (diabetes mellitus, type 2) (HCC) 03/16/2013   Arthritis 03/16/2013   Essential hypertension, benign 02/23/2013   Morbid obesity (HCC) 02/23/2013   Paroxysmal atrial fibrillation (HCC) 02/23/2013   HLD (hyperlipidemia) 02/23/2013   PCP:  Cristino Martes, NP Pharmacy:  No Pharmacies Listed    Social Determinants of Health (SDOH) Social History: SDOH Screenings   Food Insecurity: No Food Insecurity (10/11/2023)  Housing: Low Risk  (10/11/2023)  Transportation Needs: No  Transportation Needs (10/11/2023)  Utilities: Not At Risk (10/11/2023)  Social Connections: Unknown (08/08/2022)   Received from [facility]  Tobacco Use: Medium Risk (10/11/2023)   SDOH Interventions: Food Insecurity Interventions: Intervention Not Indicated Housing Interventions: Intervention Not Indicated Transportation Interventions: Intervention Not Indicated Utilities Interventions: Intervention Not Indicated   Readmission Risk Interventions    10/28/2022   12:37 PM 10/28/2022    8:36 AM  Readmission Risk Prevention Plan  Transportation Screening Complete Complete  PCP or Specialist Appt within 5-7 Days Complete Complete  Home Care Screening Complete Complete  Medication Review (RN CM) Complete Complete

## 2023-10-12 NOTE — Progress Notes (Signed)
[facility] Progress Note Patient: [name] [id number] DOB: 1951-09-24 DOA: 10/11/2023  DOS: the patient was seen and examined on 10/12/2023  Brief hospital course: PMH of HTN, HLD, obesity, type II DM, chronic lymphedema, left BKA, chronic A-fib on Eliquis presented to the hospital with complaints of right leg swelling and weeping. Currently being treated for possible cellulitis.  Assessment and Plan: Right leg cellulitis. Chronic lymphedema. Lymphedema appears to be improving significantly with leg elevation. Does not appear to have any severe cellulitis right now. Will switch to oral antibiotics. Follow-up on cultures. Continue leg elevation.  Chronic lymphedema. Chronic diastolic CHF Continue Lasix. Continue to replace potassium. Most likely this is related to his positioning.  Patient recently had to switch to a different kind of bed and because he was not able to get up from the bed he decided to sleep in his wheelchair which led to worsening of his swelling and worsening of inflammation. For now monitor.  Type of diabetes mellitus. Continue sliding scale insulin.  Chronic A-fib. Currently sinus rhythm. On Eliquis.  Obesity. Placing the patient at high risk of poor outcome.  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  Does not ambulate.    Subjective: No nausea no vomiting.  Pain improving.  No fever no chills.  Close to baseline.  Physical Exam: General: in Mild distress, No Rash Cardiovascular: S1 and S2 Present, No Murmur Respiratory: Good respiratory effort, Bilateral Air entry present. No Crackles, No wheezes Abdomen: Bowel Sound present, No tenderness Extremities: Right leg chronic appearing improving edema with blisters and peeling skin Neuro: Alert and oriented x3, no new focal deficit  Data Reviewed: I have Reviewed nursing notes, Vitals, and Lab results. Since last encounter, pertinent lab results CBC and BMP   . I have ordered test  including CBC and BMP  .   Disposition: Status is: Inpatient Remains inpatient appropriate because: Awaiting placement with proper resources.  SCDs Start: 10/11/23 0908 apixaban (ELIQUIS) tablet 5 mg   Family Communication: No one at bedside Level of care: Med-Surg   Vitals:   10/11/23 2024 10/12/23 0149 10/12/23 0546 10/12/23 1237  BP: (!) 123/59 111/66 (!) 148/87 127/68  Pulse: 94 77 88 79  Resp: 18 16 16 18   Temp: 100 F (37.8 C) 98.4 F (36.9 C) 98.2 F (36.8 C) 98.8 F (37.1 C)  TempSrc: Oral Oral Oral Oral  SpO2: 96% 93% 98% 96%  Weight:      Height:         Author: Lynden Oxford, MD 10/12/2023 7:12 PM  Please look on [email or web address] to find out who is on call.

## 2023-10-12 NOTE — Care Management Obs Status (Signed)
MEDICARE OBSERVATION STATUS NOTIFICATION   Patient Details  Name: [name] MRN: [id number] Date of Birth: 109/04/1951   Medicare Observation Status Notification Given:  Yes    Otelia Santee, LCSW 10/12/2023, 1:10 PM

## 2023-10-12 NOTE — Progress Notes (Signed)
PHARMACY NOTE -  Ancef  Pharmacy has been assisting with dosing of ancef for cellulitis. Dosage remains stable at 2g IV q8 hr and further renal adjustments per institutional Pharmacy antibiotic protocol  Pharmacy will sign off, following peripherally for culture results, dose adjustments, and length of therapy. Please reconsult if a change in clinical status warrants re-evaluation of dosage.  Bernadene Person, PharmD, BCPS [phone] 10/12/2023, 3:05 PM

## 2023-10-13 DIAGNOSIS — L03115 Cellulitis of right lower limb: Secondary | ICD-10-CM | POA: Diagnosis not present

## 2023-10-13 LAB — GLUCOSE, CAPILLARY
Glucose-Capillary: 171 mg/dL — ABNORMAL HIGH (ref 70–99)
Glucose-Capillary: 153 mg/dL — ABNORMAL HIGH (ref 70–99)
Glucose-Capillary: 167 mg/dL — ABNORMAL HIGH (ref 70–99)
Glucose-Capillary: 151 mg/dL — ABNORMAL HIGH (ref 70–99)

## 2023-10-13 LAB — BASIC METABOLIC PANEL
Anion gap: 11 (ref 5–15)
BUN: 19 mg/dL (ref 8–23)
CO2: 24 mmol/L (ref 22–32)
Calcium: 8 mg/dL — ABNORMAL LOW (ref 8.9–10.3)
Chloride: 99 mmol/L (ref 98–111)
Creatinine, Ser: 0.98 mg/dL (ref 0.61–1.24)
GFR, Estimated: >60 mL/min (ref >60)
Glucose, Bld: 148 mg/dL — ABNORMAL HIGH (ref 70–99)
Potassium: 3.4 mmol/L — ABNORMAL LOW (ref 3.5–5.1)
Sodium: 134 mmol/L — ABNORMAL LOW (ref 135–145)

## 2023-10-13 LAB — CBC
HCT: 31 % — ABNORMAL LOW (ref 39.0–52.0)
Hemoglobin: 10.1 g/dL — ABNORMAL LOW (ref 13.0–17.0)
MCH: 34.8 pg — ABNORMAL HIGH (ref 26.0–34.0)
MCHC: 32.6 g/dL (ref 30.0–36.0)
MCV: 106.9 fL — ABNORMAL HIGH (ref 80.0–100.0)
Platelets: 159 10*3/uL (ref 150–400)
RBC: 2.9 MIL/uL — ABNORMAL LOW (ref 4.22–5.81)
RDW: 14.4 % (ref 11.5–15.5)
WBC: 3.7 10*3/uL — ABNORMAL LOW (ref 4.0–10.5)
nRBC: 0 % (ref 0.0–0.2)

## 2023-10-13 MED ORDER — CEFADROXIL 500 MG PO CAPS
1000.0000 mg | ORAL_CAPSULE | Freq: Two times a day (BID) | ORAL | Status: DC
  Administered 2023-10-13 – 2023-10-14 (×3): 1000 mg via ORAL
  Filled 2023-10-13 (×4): qty 2

## 2023-10-13 NOTE — Progress Notes (Signed)
[facility] PROGRESS NOTE  Patient: [name] [id number]   PCP: Cristino Martes, NP DOB: 01-23-1951   DOA: 10/11/2023   DOS: 10/13/2023    Subjective: No acute complaint.  No nausea no vomiting no fever no chills.  Objective:  Vitals:   10/12/23 1237 10/12/23 2004 10/13/23 0548 10/13/23 1204  BP: 127/68 (!) 126/58 (!) 162/81 (!) 145/68  Pulse: 79 95 96 75  Resp: 18 19 20 18   Temp: 98.8 F (37.1 C) (!) 101.2 F (38.4 C) 98.6 F (37 C) 99.2 F (37.3 C)  TempSrc: Oral Oral Oral Oral  SpO2: 96% 93% 96% 97%  Weight:      Height:       Clear to auscultation. Right lower extremity swelling improving. Erythema involving right leg about the same.  Assessment and plan: Cellulitis  Continue oral antibiotic.  Lymphedema  improving.  Continue diuretic.  Placement. Patient is from [facility]. Facility will be able to arrange appropriate bed for him tomorrow. Will work on discharge tomorrow.   Author: Lynden Oxford, MD [facility] 10/13/2023 6:28 PM   If 7PM-7AM, please contact night-coverage at [email or web address]

## 2023-10-13 NOTE — NC FL2 (Signed)
[place] MEDICAID FL2 LEVEL OF CARE FORM     IDENTIFICATION  Patient Name: [name] Birthdate: 11-23-1951 Sex: male Admission Date (Current Location): 10/11/2023  [facility] and [place] Number:  Producer, television/film/video and Address:  [facility],  [address] [place], [place] [address]      Provider Number: [id number]  Attending Physician Name and Address:  Rolly Salter, MD  Relative Name and Phone Number:       Current Level of Care: Hospital Recommended Level of Care: Assisted Living Facility Prior Approval Number:    Date Approved/Denied:   PASRR Number:    Discharge Plan: Other (Comment) (ALF)    Current Diagnoses: Patient Active Problem List   Diagnosis Date Noted   Lymphedema 10/12/2023   Cellulitis 10/11/2023   Osteomyelitis of left foot (HCC) 10/30/2022   Cellulitis of left foot 10/27/2022   IDA (iron deficiency anemia) 09/10/2022   Chronic diastolic CHF (congestive heart failure) (HCC) 09/10/2022   Chronic anticoagulation 09/09/2022   History of colon cancer 09/09/2022   Anemia of chronic disease 09/09/2022   Venous stasis ulcers- BLE 09/09/2022   Acute cholecystitis due to biliary calculus 09/09/2022   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   Subacute eczematous & allergic contact dermatitis  09/09/2022   History of COVID-19 09/09/2022   History of homelessness 09/09/2022   Diverticulosis of colon 09/09/2022   Cholelithiasis 09/09/2022   Hepatic steatosis 09/09/2022   Living in assisted living 09/09/2022   Pain in right foot 05/06/2022   Atrial fibrillation with slow ventricular response (HCC) 12/23/2021   Pain of joint of left ankle and foot 02/26/2021   BPH (benign prostatic hyperplasia) 11/30/2016   Abnormal CT scan, colon    AKI (acute kidney injury) (HCC)    Generalized weakness    Abdominal pain    Moderate protein-calorie malnutrition (HCC) 11/10/2016   Constipation, chronic 11/09/2016   CKD (chronic  kidney disease) stage 3, GFR 30-59 ml/min (HCC) 11/09/2016   Hyperbilirubinemia 11/09/2016   Hyponatremia 11/08/2016   RBBB 09/13/2013   Snoring 09/13/2013   Edema 09/07/2013   DM2 (diabetes mellitus, type 2) (HCC) 03/16/2013   Arthritis 03/16/2013   Essential hypertension, benign 02/23/2013   Morbid obesity (HCC) 02/23/2013   Paroxysmal atrial fibrillation (HCC) 02/23/2013   HLD (hyperlipidemia) 02/23/2013    Orientation RESPIRATION BLADDER Height & Weight     Self, Time, Situation, Place  Normal Continent Weight: 265 lb (120.2 kg) Height:  6\' 3"  (190.5 cm)  BEHAVIORAL SYMPTOMS/MOOD NEUROLOGICAL BOWEL NUTRITION STATUS      Continent Diet (see discharge summary)  AMBULATORY STATUS COMMUNICATION OF NEEDS Skin   Limited Assist Verbally Other (Comment) (Right leg with generlized edema and erythemia)                       Personal Care Assistance Level of Assistance  Bathing, Feeding, Dressing Bathing Assistance: Independent Feeding assistance: Independent Dressing Assistance: Independent     Functional Limitations Info  Sight, Hearing, Speech Sight Info: Impaired Hearing Info: Adequate Speech Info: Adequate    SPECIAL CARE FACTORS FREQUENCY                       Contractures Contractures Info: Not present    Additional Factors Info  Code Status, Allergies Code Status Info: FULL Allergies Info: No Known Allergies           Current Medications (10/13/2023):  This is the  current hospital active medication list Current Facility-Administered Medications  Medication Dose Route Frequency Provider Last Rate Last Admin   acetaminophen (TYLENOL) tablet 650 mg  650 mg Oral Q6H PRN Kirby Crigler, Mir M, MD   650 mg at 10/12/23 2020   Or   acetaminophen (TYLENOL) suppository 650 mg  650 mg Rectal Q6H PRN Kirby Crigler, Mir M, MD       albuterol (PROVENTIL) (2.5 MG/3ML) 0.083% nebulizer solution 2.5 mg  2.5 mg Nebulization Q2H PRN Kirby Crigler, Mir M, MD       apixaban  Everlene Balls) tablet 5 mg  5 mg Oral BID Kirby Crigler, Mir M, MD   5 mg at 10/13/23 0843   atorvastatin (LIPITOR) tablet 20 mg  20 mg Oral QPM Kirby Crigler, Mir M, MD   20 mg at 10/12/23 1700   cefadroxil (DURICEF) capsule 1,000 mg  1,000 mg Oral BID Rolly Salter, MD   1,000 mg at 10/13/23 1610   ferrous sulfate tablet 325 mg  325 mg Oral Daily Kirby Crigler, Mir M, MD   325 mg at 10/13/23 9604   furosemide (LASIX) tablet 80 mg  80 mg Oral Daily Kirby Crigler, Mir M, MD   80 mg at 10/13/23 5409   HYDROcodone-acetaminophen (NORCO/VICODIN) 5-325 MG per tablet 1-2 tablet  1-2 tablet Oral Q4H PRN Maryln Gottron, MD   2 tablet at 10/12/23 2139   hydrOXYzine (ATARAX) tablet 25 mg  25 mg Oral Q8H PRN Kirby Crigler, Mir M, MD   25 mg at 10/11/23 2114   insulin aspart (novoLOG) injection 0-15 Units  0-15 Units Subcutaneous TID WC Kirby Crigler, Mir M, MD   3 Units at 10/13/23 1306   insulin aspart (novoLOG) injection 0-5 Units  0-5 Units Subcutaneous QHS Kirby Crigler, Mir M, MD       ondansetron [facility]) tablet 4 mg  4 mg Oral Q6H PRN Kirby Crigler, Mir M, MD       Or   ondansetron [facility]) injection 4 mg  4 mg Intravenous Q6H PRN Kirby Crigler, Mir M, MD       potassium chloride SA (KLOR-CON M) CR tablet 20 mEq  20 mEq Oral Daily Kirby Crigler, Mir M, MD   20 mEq at 10/13/23 0842   tamsulosin (FLOMAX) capsule 0.4 mg  0.4 mg Oral QPM Kirby Crigler, Mir M, MD   0.4 mg at 10/12/23 1700   traZODone (DESYREL) tablet 25 mg  25 mg Oral QHS PRN Maryln Gottron, MD   25 mg at 10/12/23 2138     Discharge Medications: Please see discharge summary for a list of discharge medications.  Relevant Imaging Results:  Relevant Lab Results:   Additional Information SSN [id number]  Otelia Santee, LCSW

## 2023-10-13 NOTE — TOC Progression Note (Signed)
Transition of Care [facility]) - Progression Note    Patient Details  Name: [name] MRN: [id number] Date of Birth: 1951/06/14  Transition of Care [facility]) CM/SW Contact  Otelia Santee, LCSW Phone Number: 10/13/2023, 2:32 PM  Clinical Narrative:    Sent referral to [facility] for [facility]. Spoke with rep Colin Mulders who shares they have no current nursing availability for [place].  CSW spoke with Hydia at [facility] regarding pt's bed being delivered. Hydia shares pt's mattress should arrive to facility tomorrow by 12pm.    Expected Discharge Plan: Assisted Living Barriers to Discharge: No Barriers Identified  Expected Discharge Plan and Services In-house Referral: Clinical Social Work Discharge Planning Services: NA Post Acute Care Choice: Home Health Living arrangements for the past 2 months: Assisted Living Facility                 DME Arranged: N/A DME Agency: NA       HH Arranged: RN           Social Determinants of Health (SDOH) Interventions SDOH Screenings   Food Insecurity: No Food Insecurity (10/11/2023)  Housing: Low Risk  (10/11/2023)  Transportation Needs: No Transportation Needs (10/11/2023)  Utilities: Not At Risk (10/11/2023)  Social Connections: Unknown (08/08/2022)   Received from [facility]  Tobacco Use: Medium Risk (10/11/2023)    Readmission Risk Interventions    10/28/2022   12:37 PM 10/28/2022    8:36 AM  Readmission Risk Prevention Plan  Transportation Screening Complete Complete  PCP or Specialist Appt within 5-7 Days Complete Complete  Home Care Screening Complete Complete  Medication Review (RN CM) Complete Complete

## 2023-10-14 DIAGNOSIS — L03115 Cellulitis of right lower limb: Secondary | ICD-10-CM | POA: Diagnosis not present

## 2023-10-14 LAB — CBC
HCT: 30.7 % — ABNORMAL LOW (ref 39.0–52.0)
Hemoglobin: 10 g/dL — ABNORMAL LOW (ref 13.0–17.0)
MCH: 35.5 pg — ABNORMAL HIGH (ref 26.0–34.0)
MCHC: 32.6 g/dL (ref 30.0–36.0)
MCV: 108.9 fL — ABNORMAL HIGH (ref 80.0–100.0)
Platelets: 164 10*3/uL (ref 150–400)
RBC: 2.82 MIL/uL — ABNORMAL LOW (ref 4.22–5.81)
RDW: 14.5 % (ref 11.5–15.5)
WBC: 4.3 10*3/uL (ref 4.0–10.5)
nRBC: 0 % (ref 0.0–0.2)

## 2023-10-14 LAB — BASIC METABOLIC PANEL
Anion gap: 13 (ref 5–15)
BUN: 20 mg/dL (ref 8–23)
CO2: 26 mmol/L (ref 22–32)
Calcium: 8.4 mg/dL — ABNORMAL LOW (ref 8.9–10.3)
Chloride: 96 mmol/L — ABNORMAL LOW (ref 98–111)
Creatinine, Ser: 0.98 mg/dL (ref 0.61–1.24)
GFR, Estimated: >60 mL/min (ref >60)
Glucose, Bld: 161 mg/dL — ABNORMAL HIGH (ref 70–99)
Potassium: 3.5 mmol/L (ref 3.5–5.1)
Sodium: 135 mmol/L (ref 135–145)

## 2023-10-14 LAB — GLUCOSE, CAPILLARY
Glucose-Capillary: 174 mg/dL — ABNORMAL HIGH (ref 70–99)
Glucose-Capillary: 154 mg/dL — ABNORMAL HIGH (ref 70–99)
Glucose-Capillary: 240 mg/dL — ABNORMAL HIGH (ref 70–99)

## 2023-10-14 MED ORDER — CEFADROXIL 500 MG PO CAPS: 1000.0000 mg | ORAL_CAPSULE | Freq: Two times a day (BID) | ORAL | 0 refills | Status: AC

## 2023-10-14 NOTE — Progress Notes (Signed)
Orthopedic Tech Progress Note Patient Details:  [name] [birth year] [id number] Applied an XL post-op shoe to the right foot. Patient stated that he wanted the shoe to be as tight as possible once applied.  Ortho Devices Type of Ortho Device: Postop shoe/boot Ortho Device/Splint Location: Right foot Ortho Device/Splint Interventions: Application   Post Interventions Patient Tolerated: Well  [name] 10/14/2023, 4:29 PM

## 2023-10-14 NOTE — Plan of Care (Signed)
No acute events overnight. Problem: Education: Goal: Ability to describe self-care measures that may prevent or decrease complications (Diabetes Survival Skills Education) will improve Outcome: Progressing Goal: Individualized Educational Video(s) Outcome: Progressing   Problem: Coping: Goal: Ability to adjust to condition or change in health will improve Outcome: Progressing   Problem: Fluid Volume: Goal: Ability to maintain a balanced intake and output will improve Outcome: Progressing   Problem: Health Behavior/Discharge Planning: Goal: Ability to identify and utilize available resources and services will improve Outcome: Progressing Goal: Ability to manage health-related needs will improve Outcome: Progressing   Problem: Metabolic: Goal: Ability to maintain appropriate glucose levels will improve Outcome: Progressing   Problem: Nutritional: Goal: Maintenance of adequate nutrition will improve Outcome: Progressing Goal: Progress toward achieving an optimal weight will improve Outcome: Progressing   Problem: Skin Integrity: Goal: Risk for impaired skin integrity will decrease Outcome: Progressing   Problem: Tissue Perfusion: Goal: Adequacy of tissue perfusion will improve Outcome: Progressing   Problem: Education: Goal: Knowledge of General Education information will improve Description: Including pain rating scale, medication(s)/side effects and non-pharmacologic comfort measures Outcome: Progressing   Problem: Health Behavior/Discharge Planning: Goal: Ability to manage health-related needs will improve Outcome: Progressing   Problem: Clinical Measurements: Goal: Ability to maintain clinical measurements within normal limits will improve Outcome: Progressing Goal: Will remain free from infection Outcome: Progressing Goal: Diagnostic test results will improve Outcome: Progressing Goal: Respiratory complications will improve Outcome: Progressing Goal:  Cardiovascular complication will be avoided Outcome: Progressing   Problem: Activity: Goal: Risk for activity intolerance will decrease Outcome: Progressing   Problem: Nutrition: Goal: Adequate nutrition will be maintained Outcome: Progressing   Problem: Coping: Goal: Level of anxiety will decrease Outcome: Progressing   Problem: Elimination: Goal: Will not experience complications related to bowel motility Outcome: Progressing Goal: Will not experience complications related to urinary retention Outcome: Progressing   Problem: Pain Management: Goal: General experience of comfort will improve Outcome: Progressing   Problem: Safety: Goal: Ability to remain free from injury will improve Outcome: Progressing   Problem: Skin Integrity: Goal: Risk for impaired skin integrity will decrease Outcome: Progressing   Problem: Clinical Measurements: Goal: Ability to avoid or minimize complications of infection will improve Outcome: Progressing   Problem: Skin Integrity: Goal: Skin integrity will improve Outcome: Progressing

## 2023-10-14 NOTE — TOC Transition Note (Signed)
Transition of Care [facility]) - CM/SW Discharge Note   Patient Details  Name: [name] MRN: [id number] Date of Birth: 12/08/1951  Transition of Care [facility]) CM/SW Contact:  Otelia Santee, LCSW Phone Number: 10/14/2023, 2:55 PM   Clinical Narrative:    Pt to return to [facility]. FL-2 and DC summary faxed and emailed to facility. DC packet placed at RN station. RN to call report to [phone]. [facility] called at 2:40pm for transportation.    Final next level of care: Assisted Living Barriers to Discharge: No Barriers Identified   Patient Goals and CMS Choice CMS Medicare.gov Compare Post Acute Care list provided to:: Patient Choice offered to / list presented to : Patient  Discharge Placement                Patient chooses bed at: Other - please specify in the comment section below: [facility], therapeutic)        Discharge Plan and Services Additional resources added to the After Visit Summary for   In-house Referral: Clinical Social Work Discharge Planning Services: NA Post Acute Care Choice: Home Health          DME Arranged: N/A DME Agency: NA       HH Arranged: RN          Social Determinants of Health (SDOH) Interventions SDOH Screenings   Food Insecurity: No Food Insecurity (10/11/2023)  Housing: Low Risk  (10/11/2023)  Transportation Needs: No Transportation Needs (10/11/2023)  Utilities: Not At Risk (10/11/2023)  Social Connections: Unknown (08/08/2022)   Received from [facility]  Tobacco Use: Medium Risk (10/11/2023)     Readmission Risk Interventions    10/14/2023    2:54 PM 10/28/2022   12:37 PM 10/28/2022    8:36 AM  Readmission Risk Prevention Plan  Transportation Screening Complete Complete Complete  PCP or Specialist Appt within 5-7 Days Complete Complete Complete  Home Care Screening Complete Complete Complete  Medication Review (RN CM) Complete Complete Complete

## 2023-10-14 NOTE — Discharge Summary (Signed)
Physician Discharge Summary   Patient: [name] MRN: [id number] DOB: [birth year]  Admit date:     10/11/2023  Discharge date: 10/14/23  Discharge Physician: Lynden Oxford  PCP: Cristino Martes, NP  Recommendations at discharge:  Follow up with PCP in 1 week for wound recheck   Follow-up Information     Cristino Martes, NP. Schedule an appointment as soon as possible for a visit in 1 week(s).   Specialty: Nurse Practitioner Why: For wound re-check Contact information: [address] [place] [address] [phone]                Discharge Diagnoses: Principal Problem:   Cellulitis Active Problems:   Lymphedema  Hospital Course: Right leg cellulitis. Chronic lymphedema. Lymphedema appears to be improving significantly with leg elevation. Does not appear to have any severe cellulitis right now. Will switch to oral antibiotics. Follow-up on cultures. So far negative Continue leg elevation.   Chronic lymphedema. Chronic diastolic CHF Continue Lasix. Continue to replace potassium. Most likely this is related to his positioning.  Patient recently had to switch to a different kind of bed and because he was not able to get up from the bed he decided to sleep in his wheelchair which led to worsening of his swelling and worsening of inflammation. For now monitor.   Type of diabetes mellitus. Continue sliding scale insulin.   Chronic A-fib. Currently sinus rhythm. On Eliquis.   Obesity. Body mass index is 33.12 kg/m.  Placing the patient at high risk of poor outcome.   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  Does not ambulate.  Consultants:  none  Procedures performed:  none  DISCHARGE MEDICATION: Allergies as of 10/14/2023   No Known Allergies      Medication List     TAKE these medications    acetaminophen 325 MG tablet Commonly known as: TYLENOL Take 650 mg by mouth every 4 (four) hours as needed for moderate pain (pain score 4-6),  fever or mild pain (pain score 1-3).   apixaban 5 MG Tabs tablet Commonly known as: ELIQUIS Take 1 tablet (5 mg total) by mouth 2 (two) times daily.   atorvastatin 20 MG tablet Commonly known as: LIPITOR Take 20 mg by mouth every evening.   cefadroxil 500 MG capsule Commonly known as: DURICEF Take 2 capsules (1,000 mg total) by mouth 2 (two) times daily for 5 days.   docusate sodium 100 MG capsule Commonly known as: COLACE Take 1 capsule (100 mg total) by mouth 2 (two) times daily.   FeroSul 325 (65 FE) MG tablet Generic drug: ferrous sulfate Take 325 mg by mouth daily.   fluocinolone 0.01 % cream Apply 1 Application topically 2 (two) times daily. Apply to lower right leg   furosemide 80 MG tablet Commonly known as: LASIX Take 80 mg by mouth daily.   glipiZIDE 10 MG 24 hr tablet Commonly known as: GLUCOTROL XL Take 10 mg by mouth daily.   Gvoke HypoPen 1-Pack 1 MG/0.2ML Soaj Generic drug: Glucagon Inject 1 mg into the skin as needed (hypoglycemia).   hydrOXYzine 25 MG tablet Commonly known as: ATARAX Take 1 tablet (25 mg total) by mouth every 8 (eight) hours as needed for itching. Use caution as this medication may make you drowsy What changed: additional instructions   metFORMIN 500 MG 24 hr tablet Commonly known as: GLUCOPHAGE-XR Take 1,000 mg by mouth in the morning and at bedtime.   Nyamyc powder Generic drug: nystatin Apply 1 Application topically  2 (two) times daily.   polyethylene glycol 17 g packet Commonly known as: MiraLax Take 17 g by mouth daily. Hold for diarrhea What changed:  when to take this reasons to take this additional instructions   potassium chloride SA 20 MEQ tablet Commonly known as: KLOR-CON M Take 20 mEq by mouth daily.   senna 8.6 MG Tabs tablet Commonly known as: SENOKOT Take 1 tablet by mouth every 12 (twelve) hours as needed for mild constipation or moderate constipation.   tamsulosin 0.4 MG Caps capsule Commonly known  as: FLOMAX Take 1 capsule (0.4 mg total) by mouth daily. What changed: when to take this   traZODone 100 MG tablet Commonly known as: DESYREL Take 100 mg by mouth at bedtime.   triamcinolone cream 0.1 % Commonly known as: KENALOG Apply 1 Application topically as needed (rash).               Discharge Care Instructions  (From admission, onward)           Start     Ordered   10/14/23 0000  Discharge wound care:       Comments: Apply Xeroform to right leg wounds Q day, then cover with ABD pads and kerlex and then ace wrap in a spiral fashion, beginning just behind toes to below knee   10/14/23 1417           Disposition: ALF/ILF Diet recommendation: Cardiac diet  Discharge Exam: Vitals:   10/13/23 1204 10/13/23 2046 10/14/23 0616 10/14/23 1239  BP: (!) 145/68 (!) 148/75 131/69 121/67  Pulse: 75 75 65 72  Resp: 18 20 16 18   Temp: 99.2 F (37.3 C) 99.6 F (37.6 C) 98.8 F (37.1 C) 98.8 F (37.1 C)  TempSrc: Oral Oral Oral Oral  SpO2: 97% 95% 100% 98%  Weight:      Height:       General: Appear in no distress; no visible Abnormal Neck Mass Or lumps, Conjunctiva normal Cardiovascular: S1 and S2 Present, no Murmur, Respiratory: good respiratory effort, Bilateral Air entry present and CTA, no Crackles, no wheezes Abdomen: Bowel Sound present, Non tender  Extremities: chronic right Pedal edema Neurology: alert and oriented to time, place, and person  Filed Weights   10/11/23 0308  Weight: 120.2 kg   Condition at discharge: stable  The results of significant diagnostics from this hospitalization (including imaging, microbiology, ancillary and laboratory) are listed below for reference.   Imaging Studies: No results found.  Microbiology: Results for orders placed or performed during the hospital encounter of 10/11/23  Blood culture (routine x 2)     Status: None (Preliminary result)   Collection Time: 10/11/23  8:50 AM   Specimen: BLOOD  Result Value  Ref Range Status   Specimen Description   Final    BLOOD SITE NOT SPECIFIED Performed at [facility], [address] [place], [place] [address]    Special Requests   Final    BOTTLES DRAWN AEROBIC AND ANAEROBIC Blood Culture adequate volume Performed at [facility], [address] [place], [place] [address]    Culture   Final    NO GROWTH 3 DAYS Performed at [facility], [address], [place], [place] [address]    Report Status PENDING  Incomplete  Blood culture (routine x 2)     Status: None (Preliminary result)   Collection Time: 10/11/23  1:29 PM   Specimen: BLOOD  Result Value Ref Range Status  Specimen Description   Final    BLOOD BLOOD RIGHT HAND Performed at [facility], [address] [place], [place] [address]    Special Requests   Final    BOTTLES DRAWN AEROBIC AND ANAEROBIC Blood Culture adequate volume Performed at [facility], [address] [place], [place] [address]    Culture   Final    NO GROWTH 3 DAYS Performed at [facility], [address] [place], [place] [address]    Report Status PENDING  Incomplete   Labs: CBC: Recent Labs  Lab 10/11/23 0358 10/12/23 0604 10/13/23 0525 10/14/23 0554  WBC 6.5 4.5 3.7* 4.3  NEUTROABS 5.5  --   --   --   HGB 11.0* 10.6* 10.1* 10.0*  HCT 32.6* 32.7* 31.0* 30.7*  MCV 106.9* 107.9* 106.9* 108.9*  PLT 201 192 159 164   Basic Metabolic Panel: Recent Labs  Lab 10/11/23 0358 10/12/23 0604 10/13/23 0525 10/14/23 0554  NA 133* 134* 134* 135  K 4.2 3.5 3.4* 3.5  CL 98 95* 99 96*  CO2 24 26 24 26   GLUCOSE 136* 169* 148* 161*  BUN 29* 21 19 20   CREATININE 1.17 1.26* 0.98 0.98  CALCIUM 8.7* 8.4* 8.0* 8.4*   Liver Function Tests: No results for input(s): "AST", "ALT", "ALKPHOS", "BILITOT", "PROT", "ALBUMIN" in the last 168 hours. CBG: Recent Labs  Lab 10/13/23 1147 10/13/23 1724 10/13/23 2153 10/14/23 0807  10/14/23 1237  GLUCAP 153* 167* 151* 174* 154*    Discharge time spent: greater than 30 minutes.  Author: Lynden Oxford, MD  [facility]

## 2023-10-16 LAB — CULTURE, BLOOD (ROUTINE X 2)
Culture: NO GROWTH
Culture: NO GROWTH
Special Requests: ADEQUATE
Special Requests: ADEQUATE

## 2023-11-28 ENCOUNTER — Encounter (HOSPITAL_BASED_OUTPATIENT_CLINIC_OR_DEPARTMENT_OTHER): Payer: Self-pay | Admitting: Family

## 2023-11-28 ENCOUNTER — Ambulatory Visit (INDEPENDENT_AMBULATORY_CARE_PROVIDER_SITE_OTHER): Payer: 59 | Admitting: Family

## 2023-11-28 VITALS — BP 122/60 | HR 73 | Ht 75.0 in

## 2023-11-28 DIAGNOSIS — I4821 Permanent atrial fibrillation: Secondary | ICD-10-CM

## 2023-11-28 DIAGNOSIS — D6859 Other primary thrombophilia: Secondary | ICD-10-CM | POA: Diagnosis not present

## 2023-11-28 DIAGNOSIS — E1165 Type 2 diabetes mellitus with hyperglycemia: Secondary | ICD-10-CM

## 2023-11-28 DIAGNOSIS — I890 Lymphedema, not elsewhere classified: Secondary | ICD-10-CM

## 2023-11-28 DIAGNOSIS — I5032 Chronic diastolic (congestive) heart failure: Secondary | ICD-10-CM

## 2023-11-28 DIAGNOSIS — I77810 Thoracic aortic ectasia: Secondary | ICD-10-CM | POA: Diagnosis not present

## 2023-11-28 DIAGNOSIS — I4510 Unspecified right bundle-branch block: Secondary | ICD-10-CM

## 2023-11-28 NOTE — Patient Instructions (Addendum)
Medication Instructions:  Continue your current medications.   Recommend discussing Ozempic or Mounjaro with your primary care provider. This can help to control blood sugar, help you to lose weight, and reduce your cardiovascular risk.   *If you need a refill on your cardiac medications before your next appointment, please call your pharmacy*  Procedures: Your provider has recommended a CT of your aorta in February for monitoring of your aortic root.   Follow-Up: At [facility], you and your health needs are our priority.  As part of our continuing mission to provide you with exceptional heart care, we have created designated Provider Care Teams.  These Care Teams include your primary Cardiologist (physician) and Advanced Practice Providers (APPs -  Physician Assistants and Nurse Practitioners) who all work together to provide you with the care you need, when you need it.  We recommend signing up for the patient portal called "MyChart".  Sign up information is provided on this After Visit Summary.  MyChart is used to connect with patients for Virtual Visits (Telemedicine).  Patients are able to view lab/test results, encounter notes, upcoming appointments, etc.  Non-urgent messages can be sent to your provider as well.   To learn more about what you can do with MyChart, go to [email or web address].    Your next appointment:   6 month(s)  Provider:   K. Italy Hilty, MD or Gillian Shields, NP    Other Instructions  For weight loss: Recommend focusing on increasing in take of protein and fiber. Protein breaks down into amino acids which your body uses to create muscle Fiber acts  like a broom to sweep out excess sugar and cholesterol to help improve cholesterol and blood sugar control.  Exercises to do While Sitting Warm-up Before starting other exercises: Sit up as straight as you can. Have your knees bent at 90 degrees, which is the shape of the capital letter "L." Keep your  feet flat on the floor. Sit at the front edge of your chair, if you can. Pull in (tighten) the muscles in your abdomen and stretch your spine and neck as straight as you can. Hold this position for a few minutes. Breathe in and out evenly. Try to concentrate on your breathing, and relax your mind.  Stretching Exercise A: Arm stretch Hold your arms out straight in front of your body. Bend your hands at the wrist with your fingers pointing up, as if signaling someone to stop. Notice the slight tension in your forearms as you hold the position. Keeping your arms out and your hands bent, rotate your hands outward as far as you can and hold this stretch. Aim to have your thumbs pointing up and your pinkie fingers pointing down. Slowly repeat arm stretches for one minute as tolerated. Exercise B: Leg stretch If you can move your legs, try to "draw" letters on the floor with the toes of your foot. Write your name with one foot. Write your name with the toes of your other foot. Slowly repeat the movements for one minute as tolerated. Exercise C: Reach for the sky Reach your hands as far over your head as you can to stretch your spine. Move your hands and arms as if you are climbing a rope. Slowly repeat the movements for one minute as tolerated.  Range of motion exercises Exercise A: Shoulder roll Let your arms hang loosely at your sides. Lift just your shoulders up toward your ears, then let them relax back down. When your  shoulders feel loose, rotate your shoulders in backward and forward circles. Do shoulder rolls slowly for one minute as tolerated. Exercise B: March in place As if you are marching, pump your arms and lift your legs up and down. Lift your knees as high as you can. If you are unable to lift your knees, just pump your arms and move your ankles and feet up and down. March in place for one minute as tolerated. Exercise C: Seated jumping jacks Let your arms hang down  straight. Keeping your arms straight, lift them up over your head. Aim to point your fingers to the ceiling. While you lift your arms, straighten your legs and slide your heels along the floor to your sides, as wide as you can. As you bring your arms back down to your sides, slide your legs back together. If you are unable to use your legs, just move your arms. Slowly repeat seated jumping jacks for one minute as tolerated.  Strengthening exercises Exercise A: Shoulder squeeze Hold your arms straight out from your body to your sides, with your elbows bent and your fists pointed at the ceiling. Keeping your arms in the bent position, move them forward so your elbows and forearms meet in front of your face. Open your arms back out as wide as you can with your elbows still bent, until you feel your shoulder blades squeezing together. Hold for 5 seconds. Slowly repeat the movements forward and backward for one minute as tolerated.

## 2023-11-28 NOTE — Progress Notes (Signed)
Cardiology Office Note:  .   Date:  11/28/2023  ID:  [name], DOB 10-Feb-1951, MRN [id number] PCP: Cristino Martes, NP  [facility] Providers Cardiologist:  Chrystie Nose, MD    History of Present Illness: .   [name] is a 72 y.o. male with history of atrial fibrillation, BPH, anxiety, constipation, DM2, HTN, HLD, diastolic heart failure, lymphedema, macrocytic anemia, s/p L BKA, bifasicular block   He established with Dr. Rennis Golden in 2014 due to moving to the area from [place] with known diagnosis of atrial fibrillation. He has been rate controlled. Intermittently on anticoagulation due to financial limitations. He has not followed routinely and has primarily been seen during hospitalizations.    He was admitted 12/22/21 with abdominal pain, dizziness. Abdominal imaging consistent with cholelithiasis without cholecystitis. He was found to be bradycardia in the 30s with bifascicular block. Digoxin level 1.1 and discontinued. Metoprolol additionally discontinued. Echo with EF 60-65%, no RWMA, RV normal size and function, LA mildly dilated, no significant valvular abnormalities, aortic root 47mm.    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  Last seen 06/10/23 with improvement in LE edema since PCP added Lasix 80mg  daily. ABI 07/2023 noncompressible RLE arteries but no significant change from prior.   Admitted 10/20 - 10/14/2023 with RLE cellulitis and lymphedema treated with p.o. antibiotics.  Presents today for follow up from ALF independently. Continues to ambulate with Braven Wolk despite RLE BKA and also utilizes wheelchair. Has completed PT and now doing seated exercises. Motivated to lose weight particularly in his abdomen. Does note dietary indiscretion with evening snacks and simple carbohydrates such as frequently eating sandwiches on white bread. Reviewed benefits of complex carbohydrates, increasing fiber/protein intake for weight loss and blood  sugar control. Reports blood sugar has been overall controlled.  LE edema has been improved with elevation of his RLE and compression wrap replaced daily by ALF. Upcoming visit with wound clinic. Reports no shortness of breath nor dyspnea on exertion. Reports no chest pain, pressure, or tightness. No orthopnea, PND. Reports no palpitations.    ROS: Please see the history of present illness.    All other systems reviewed and are negative.   Studies Reviewed: .        Cardiac Studies & Procedures      ECHOCARDIOGRAM  ECHOCARDIOGRAM COMPLETE 02/03/2023  Narrative ECHOCARDIOGRAM REPORT    Patient Name:   [name] Date of Exam: 02/03/2023 Medical Rec #:  [id number]       Height:       75.0 in Accession #:    [id number]      Weight:       254.0 lb Date of Birth:  24-Nov-1951       BSA:          2.429 m Patient Age:    72 years        BP:           127/72 mmHg Patient Gender: M               HR:           69 bpm. Exam Location:  Outpatient  Procedure: 2D Echo, 3D Echo, Color Doppler, Cardiac Doppler and Strain Analysis  Indications:    Ascending aortic aneurysm  History:        Patient has prior history of Echocardiogram examinations, most recent 09/10/2022. Arrythmias:Atrial Fibrillation and RBBB; Risk Factors:Diabetes, Hypertension, Former Smoker and Dyslipidemia. Covid  19.  Sonographer:    Jeryl Columbia RDCS Referring Phys: [phone] Viaan Knippenberg S Cressida Milford  IMPRESSIONS   1. Left ventricular ejection fraction, by estimation, is 55 to 60%. The left ventricle has normal function. Left ventricular endocardial border not optimally defined to evaluate regional wall motion. Left ventricular diastolic function could not be evaluated. 2. Right ventricular systolic function is normal. The right ventricular size is normal. Tricuspid regurgitation signal is inadequate for assessing PA pressure. 3. Left atrial size was mildly dilated. 4. The mitral valve is grossly normal. Trivial mitral  valve regurgitation. No evidence of mitral stenosis. 5. The aortic valve is tricuspid. Aortic valve regurgitation is not visualized. No aortic stenosis is present. 6. Aortic dilatation noted. Aneurysm of the aortic root, measuring 45 mm. 7. The inferior vena cava is normal in size with greater than 50% respiratory variability, suggesting right atrial pressure of 3 mmHg.  Comparison(s): No significant change from prior study.  FINDINGS Left Ventricle: Left ventricular ejection fraction, by estimation, is 55 to 60%. The left ventricle has normal function. Left ventricular endocardial border not optimally defined to evaluate regional wall motion. Global longitudinal strain performed but not reported based on interpreter judgement due to suboptimal tracking. 3D left ventricular ejection fraction analysis performed but not reported based on interpreter judgement due to suboptimal tracking. The left ventricular internal cavity size was normal in size. There is no left ventricular hypertrophy. Left ventricular diastolic function could not be evaluated due to atrial fibrillation. Left ventricular diastolic function could not be evaluated.  Right Ventricle: The right ventricular size is normal. No increase in right ventricular wall thickness. Right ventricular systolic function is normal. Tricuspid regurgitation signal is inadequate for assessing PA pressure.  Left Atrium: Left atrial size was mildly dilated.  Right Atrium: Right atrial size was normal in size.  Pericardium: There is no evidence of pericardial effusion.  Mitral Valve: The mitral valve is grossly normal. Trivial mitral valve regurgitation. No evidence of mitral valve stenosis.  Tricuspid Valve: The tricuspid valve is grossly normal. Tricuspid valve regurgitation is trivial. No evidence of tricuspid stenosis.  Aortic Valve: The aortic valve is tricuspid. Aortic valve regurgitation is not visualized. No aortic stenosis is  present.  Pulmonic Valve: The pulmonic valve was grossly normal. Pulmonic valve regurgitation is not visualized. No evidence of pulmonic stenosis.  Aorta: Aortic dilatation noted. There is an aneurysm involving the aortic root measuring 45 mm.  Venous: The inferior vena cava is normal in size with greater than 50% respiratory variability, suggesting right atrial pressure of 3 mmHg.  IAS/Shunts: The atrial septum is grossly normal.   LEFT VENTRICLE PLAX 2D LVIDd:         4.31 cm   Diastology LVIDs:         3.25 cm   LV e' medial:    8.27 cm/s LV PW:         1.17 cm   LV E/e' medial:  12.7 LV IVS:        0.85 cm   LV e' lateral:   9.03 cm/s LVOT diam:     2.30 cm   LV E/e' lateral: 11.6 LVOT Area:     4.15 cm  3D Volume EF: 3D EF:        56 % LV EDV:       181 ml LV ESV:       79 ml LV SV:        102 ml  LEFT ATRIUM  Index        RIGHT ATRIUM           Index LA diam:        5.00 cm 2.06 cm/m   RA Area:     23.40 cm LA Vol (A2C):   87.9 ml 36.19 ml/m  RA Volume:   72.10 ml  29.68 ml/m LA Vol (A4C):   76.1 ml 31.33 ml/m LA Biplane Vol: 86.4 ml 35.57 ml/m  AORTA Ao Root diam: 4.50 cm Ao Asc diam:  3.70 cm  MITRAL VALVE MV Area (PHT): 3.12 cm     SHUNTS MV Decel Time: 243 msec     Systemic Diam: 2.30 cm MV E velocity: 105.00 cm/s MV A velocity: 29.10 cm/s MV E/A ratio:  3.61  Lennie Odor MD Electronically signed by Lennie Odor MD Signature Date/Time: 02/03/2023/7:55:15 PM    Final             Risk Assessment/Calculations:    CHA2DS2-VASc Score = 5   This indicates a 7.2% annual risk of stroke. The patient's score is based upon: CHF History: 1 HTN History: 1 Diabetes History: 1 Stroke History: 0 Vascular Disease History: 1 Age Score: 1 Gender Score: 0            Physical Exam:   VS:  BP 122/60   Pulse 73   Ht 6\' 3"  (1.905 m)   SpO2 97%   BMI 33.12 kg/m    Wt Readings from Last 3 Encounters:  10/11/23 265 lb (120.2 kg)   08/29/23 260 lb (117.9 kg)  12/27/22 254 lb (115.2 kg)    GEN: Well nourished, overweight, well developed in no acute distress NECK: No JVD; No carotid bruits CARDIAC: IRIR, no murmurs, rubs, gallops RESPIRATORY:  Clear to auscultation without rales, wheezing or rhonchi  ABDOMEN: Soft, non-tender, non-distended EXTREMITIES:  No edema; No deformity   ASSESSMENT AND PLAN: .    Permanent atrial fibrillation / Hypercoagulable state / RBBB - Rate controlled today. metoprolol and Digoxin discontinued 12/2021 due to bifasicular block. Monitor RBBB with periodic EKG. CHA2DS2-VASc Score = 5 [CHF History: 1, HTN History: 1, Diabetes History: 1, Stroke History: 0, Vascular Disease History: 1, Age Score: 1, Gender Score: 0].  Therefore, the patient's annual risk of stroke is 7.2 %.Continue Eliquis 5mg  BID. Denies bleeding complications.   HFpEF / LE edema / Lymphedema - Continue Furosemide 80mg  daily. Renal function has been stable. RLE wrapped in clinic and ALF has been wrapping daily due to leg ulcer which he reports is improving. Upcoming visit to re-establish with wound clinic.  Aortic root dilation - Echo 01/2023 LVEF 55-60% with aneurysm of aortic root 45mm. CT aorta 01/2024 already ordered. Continue optimal BP control.   HLD - Continue Atorvastatin 20mg  daily.   DM2 / morbid obesity - Has started exercises while sitting, provided list of additional exercises he can complete while sitting.  Previously completed PT. Motivated to lose weight in his abdominal regio. Encouraged to discuss Ozempic vs Mounjaro with his PCP at next OV for weigh tloss, glycemic control, and reduction of cardiovascular risk. Refer to diabetes educator for assistance with education about complex vs simple carbohydrates.        Dispo: follow up in 6 months  Signed, Alver Sorrow, NP

## 2023-12-13 ENCOUNTER — Encounter (HOSPITAL_BASED_OUTPATIENT_CLINIC_OR_DEPARTMENT_OTHER): Payer: 59 | Admitting: General Surgery

## 2023-12-20 ENCOUNTER — Encounter (HOSPITAL_COMMUNITY): Payer: Self-pay

## 2023-12-20 ENCOUNTER — Other Ambulatory Visit: Payer: Self-pay

## 2023-12-20 ENCOUNTER — Inpatient Hospital Stay (HOSPITAL_COMMUNITY)
Admission: EM | Admit: 2023-12-20 | Discharge: 2023-12-23 | DRG: 603 | Disposition: A | Discharge status: Nursing Home (Includes ICF) | Payer: 59 | Source: Skilled Nursing Facility | Attending: Internal Medicine | Admitting: Internal Medicine

## 2023-12-20 ENCOUNTER — Emergency Department (HOSPITAL_COMMUNITY): Payer: 59

## 2023-12-20 DIAGNOSIS — Z820 Family history of epilepsy and other diseases of the nervous system: Secondary | ICD-10-CM

## 2023-12-20 DIAGNOSIS — E1122 Type 2 diabetes mellitus with diabetic chronic kidney disease: Secondary | ICD-10-CM | POA: Diagnosis present

## 2023-12-20 DIAGNOSIS — E66812 Obesity, class 2: Secondary | ICD-10-CM | POA: Diagnosis present

## 2023-12-20 DIAGNOSIS — Z8616 Personal history of COVID-19: Secondary | ICD-10-CM | POA: Diagnosis not present

## 2023-12-20 DIAGNOSIS — E785 Hyperlipidemia, unspecified: Secondary | ICD-10-CM | POA: Diagnosis present

## 2023-12-20 DIAGNOSIS — Z79899 Other long term (current) drug therapy: Secondary | ICD-10-CM

## 2023-12-20 DIAGNOSIS — M1711 Unilateral primary osteoarthritis, right knee: Secondary | ICD-10-CM | POA: Diagnosis present

## 2023-12-20 DIAGNOSIS — N179 Acute kidney failure, unspecified: Secondary | ICD-10-CM | POA: Diagnosis present

## 2023-12-20 DIAGNOSIS — Z87442 Personal history of urinary calculi: Secondary | ICD-10-CM

## 2023-12-20 DIAGNOSIS — Z8249 Family history of ischemic heart disease and other diseases of the circulatory system: Secondary | ICD-10-CM | POA: Diagnosis not present

## 2023-12-20 DIAGNOSIS — E871 Hypo-osmolality and hyponatremia: Secondary | ICD-10-CM | POA: Diagnosis present

## 2023-12-20 DIAGNOSIS — E119 Type 2 diabetes mellitus without complications: Principal | ICD-10-CM

## 2023-12-20 DIAGNOSIS — Z87891 Personal history of nicotine dependence: Secondary | ICD-10-CM | POA: Diagnosis not present

## 2023-12-20 DIAGNOSIS — I5032 Chronic diastolic (congestive) heart failure: Secondary | ICD-10-CM | POA: Diagnosis present

## 2023-12-20 DIAGNOSIS — L03115 Cellulitis of right lower limb: Secondary | ICD-10-CM | POA: Diagnosis present

## 2023-12-20 DIAGNOSIS — D631 Anemia in chronic kidney disease: Secondary | ICD-10-CM | POA: Diagnosis present

## 2023-12-20 DIAGNOSIS — I890 Lymphedema, not elsewhere classified: Secondary | ICD-10-CM | POA: Diagnosis present

## 2023-12-20 DIAGNOSIS — Z89512 Acquired absence of left leg below knee: Secondary | ICD-10-CM | POA: Diagnosis not present

## 2023-12-20 DIAGNOSIS — Z9049 Acquired absence of other specified parts of digestive tract: Secondary | ICD-10-CM

## 2023-12-20 DIAGNOSIS — R5381 Other malaise: Secondary | ICD-10-CM | POA: Diagnosis present

## 2023-12-20 DIAGNOSIS — Z7901 Long term (current) use of anticoagulants: Secondary | ICD-10-CM

## 2023-12-20 DIAGNOSIS — R531 Weakness: Secondary | ICD-10-CM

## 2023-12-20 DIAGNOSIS — Z7984 Long term (current) use of oral hypoglycemic drugs: Secondary | ICD-10-CM | POA: Diagnosis not present

## 2023-12-20 DIAGNOSIS — I130 Hypertensive heart and chronic kidney disease with heart failure and stage 1 through stage 4 chronic kidney disease, or unspecified chronic kidney disease: Secondary | ICD-10-CM | POA: Diagnosis present

## 2023-12-20 DIAGNOSIS — E878 Other disorders of electrolyte and fluid balance, not elsewhere classified: Secondary | ICD-10-CM | POA: Diagnosis present

## 2023-12-20 DIAGNOSIS — N182 Chronic kidney disease, stage 2 (mild): Secondary | ICD-10-CM | POA: Diagnosis present

## 2023-12-20 DIAGNOSIS — X58XXXA Exposure to other specified factors, initial encounter: Secondary | ICD-10-CM | POA: Diagnosis present

## 2023-12-20 DIAGNOSIS — I480 Paroxysmal atrial fibrillation: Secondary | ICD-10-CM | POA: Diagnosis present

## 2023-12-20 DIAGNOSIS — Z6836 Body mass index (BMI) 36.0-36.9, adult: Secondary | ICD-10-CM

## 2023-12-20 DIAGNOSIS — N401 Enlarged prostate with lower urinary tract symptoms: Secondary | ICD-10-CM | POA: Diagnosis present

## 2023-12-20 DIAGNOSIS — N400 Enlarged prostate without lower urinary tract symptoms: Secondary | ICD-10-CM | POA: Diagnosis present

## 2023-12-20 DIAGNOSIS — S80821A Blister (nonthermal), right lower leg, initial encounter: Secondary | ICD-10-CM | POA: Diagnosis present

## 2023-12-20 DIAGNOSIS — Z85038 Personal history of other malignant neoplasm of large intestine: Secondary | ICD-10-CM

## 2023-12-20 DIAGNOSIS — L0390 Cellulitis, unspecified: Secondary | ICD-10-CM | POA: Diagnosis present

## 2023-12-20 DIAGNOSIS — D638 Anemia in other chronic diseases classified elsewhere: Secondary | ICD-10-CM | POA: Diagnosis present

## 2023-12-20 DIAGNOSIS — I10 Essential (primary) hypertension: Secondary | ICD-10-CM | POA: Diagnosis present

## 2023-12-20 DIAGNOSIS — Z808 Family history of malignant neoplasm of other organs or systems: Secondary | ICD-10-CM

## 2023-12-20 LAB — BASIC METABOLIC PANEL
Anion gap: 9 (ref 5–15)
BUN: 25 mg/dL — ABNORMAL HIGH (ref 8–23)
CO2: 27 mmol/L (ref 22–32)
Calcium: 8.7 mg/dL — ABNORMAL LOW (ref 8.9–10.3)
Chloride: 97 mmol/L — ABNORMAL LOW (ref 98–111)
Creatinine, Ser: 1.32 mg/dL — ABNORMAL HIGH (ref 0.61–1.24)
GFR, Estimated: 57 mL/min — ABNORMAL LOW (ref >60)
Glucose, Bld: 119 mg/dL — ABNORMAL HIGH (ref 70–99)
Potassium: 4.2 mmol/L (ref 3.5–5.1)
Sodium: 133 mmol/L — ABNORMAL LOW (ref 135–145)

## 2023-12-20 LAB — CBC WITH DIFFERENTIAL/PLATELET
Abs Immature Granulocytes: 0.02 10*3/uL (ref 0.00–0.07)
Basophils Absolute: 0 10*3/uL (ref 0.0–0.1)
Basophils Relative: 0 %
Eosinophils Absolute: 0.1 10*3/uL (ref 0.0–0.5)
Eosinophils Relative: 2 %
HCT: 31.9 % — ABNORMAL LOW (ref 39.0–52.0)
Hemoglobin: 10.8 g/dL — ABNORMAL LOW (ref 13.0–17.0)
Immature Granulocytes: 0 %
Lymphocytes Relative: 13 %
Lymphs Abs: 0.8 10*3/uL (ref 0.7–4.0)
MCH: 35.3 pg — ABNORMAL HIGH (ref 26.0–34.0)
MCHC: 33.9 g/dL (ref 30.0–36.0)
MCV: 104.2 fL — ABNORMAL HIGH (ref 80.0–100.0)
Monocytes Absolute: 0.2 10*3/uL (ref 0.1–1.0)
Monocytes Relative: 3 %
Neutro Abs: 5 10*3/uL (ref 1.7–7.7)
Neutrophils Relative %: 82 %
Platelets: 263 10*3/uL (ref 150–400)
RBC: 3.06 MIL/uL — ABNORMAL LOW (ref 4.22–5.81)
RDW: 14.9 % (ref 11.5–15.5)
WBC: 6.1 10*3/uL (ref 4.0–10.5)
nRBC: 0 % (ref 0.0–0.2)

## 2023-12-20 LAB — GLUCOSE, CAPILLARY: Glucose-Capillary: 114 mg/dL — ABNORMAL HIGH (ref 70–99)

## 2023-12-20 LAB — I-STAT CG4 LACTIC ACID, ED: Lactic Acid, Venous: 1.3 mmol/L (ref 0.5–1.9)

## 2023-12-20 LAB — TSH: TSH: 3.653 u[IU]/mL (ref 0.350–4.500)

## 2023-12-20 MED ORDER — HYDRALAZINE HCL 20 MG/ML IJ SOLN: 5.0000 mg | Freq: Four times a day (QID) | INTRAMUSCULAR | Status: DC | PRN

## 2023-12-20 MED ORDER — ONDANSETRON HCL 4 MG/2ML IJ SOLN: 4.0000 mg | Freq: Four times a day (QID) | INTRAMUSCULAR | Status: DC | PRN

## 2023-12-20 MED ORDER — SODIUM CHLORIDE 0.9 % IV SOLN: 250.0000 mL | INTRAVENOUS | Status: AC | PRN

## 2023-12-20 MED ORDER — HYDROXYZINE HCL 25 MG PO TABS: 25.0000 mg | ORAL_TABLET | Freq: Three times a day (TID) | ORAL | Status: DC | PRN

## 2023-12-20 MED ORDER — CEFAZOLIN SODIUM-DEXTROSE 1-4 GM/50ML-% IV SOLN
1.0000 g | Freq: Three times a day (TID) | INTRAVENOUS | Status: DC
  Administered 2023-12-20 – 2023-12-22 (×5): 1 g via INTRAVENOUS
  Filled 2023-12-20 (×6): qty 50

## 2023-12-20 MED ORDER — POTASSIUM CHLORIDE CRYS ER 20 MEQ PO TBCR
20.0000 meq | EXTENDED_RELEASE_TABLET | Freq: Every day | ORAL | Status: DC
  Administered 2023-12-21 – 2023-12-23 (×3): 20 meq via ORAL
  Filled 2023-12-20 (×3): qty 1

## 2023-12-20 MED ORDER — ACETAMINOPHEN 325 MG PO TABS
650.0000 mg | ORAL_TABLET | Freq: Four times a day (QID) | ORAL | Status: DC | PRN
  Filled 2023-12-20: qty 2

## 2023-12-20 MED ORDER — POLYETHYLENE GLYCOL 3350 17 G PO PACK
17.0000 g | PACK | Freq: Every day | ORAL | Status: DC
  Administered 2023-12-20 – 2023-12-23 (×4): 17 g via ORAL
  Filled 2023-12-20 (×4): qty 1

## 2023-12-20 MED ORDER — APIXABAN 5 MG PO TABS
5.0000 mg | ORAL_TABLET | Freq: Two times a day (BID) | ORAL | Status: DC
  Administered 2023-12-20 – 2023-12-23 (×6): 5 mg via ORAL
  Filled 2023-12-20 (×6): qty 1

## 2023-12-20 MED ORDER — INSULIN ASPART 100 UNIT/ML IJ SOLN
0.0000 [IU] | Freq: Three times a day (TID) | INTRAMUSCULAR | Status: DC
  Administered 2023-12-21: 2 [IU] via SUBCUTANEOUS
  Administered 2023-12-21 (×2): 1 [IU] via SUBCUTANEOUS
  Administered 2023-12-22: 2 [IU] via SUBCUTANEOUS
  Administered 2023-12-22: 1 [IU] via SUBCUTANEOUS
  Administered 2023-12-22 – 2023-12-23 (×2): 2 [IU] via SUBCUTANEOUS
  Filled 2023-12-20: qty 0.09

## 2023-12-20 MED ORDER — SODIUM CHLORIDE 0.9 % IV SOLN
2.0000 g | Freq: Once | INTRAVENOUS | Status: DC
  Filled 2023-12-20: qty 12.5

## 2023-12-20 MED ORDER — SODIUM CHLORIDE 0.9% FLUSH: 3.0000 mL | INTRAVENOUS | Status: DC | PRN

## 2023-12-20 MED ORDER — INSULIN ASPART 100 UNIT/ML IJ SOLN
0.0000 [IU] | Freq: Every day | INTRAMUSCULAR | Status: DC
Start: 2023-12-20 — End: 2023-12-23
  Filled 2023-12-20: qty 0.05

## 2023-12-20 MED ORDER — HYDROCODONE-ACETAMINOPHEN 5-325 MG PO TABS
1.0000 | ORAL_TABLET | ORAL | Status: DC | PRN
  Administered 2023-12-20 – 2023-12-23 (×9): 2 via ORAL
  Filled 2023-12-20 (×11): qty 2

## 2023-12-20 MED ORDER — ATORVASTATIN CALCIUM 20 MG PO TABS
20.0000 mg | ORAL_TABLET | Freq: Every evening | ORAL | Status: DC
  Administered 2023-12-20 – 2023-12-22 (×3): 20 mg via ORAL
  Filled 2023-12-20: qty 1
  Filled 2023-12-20: qty 2
  Filled 2023-12-20: qty 1

## 2023-12-20 MED ORDER — TRAZODONE HCL 100 MG PO TABS
100.0000 mg | ORAL_TABLET | Freq: Every day | ORAL | Status: DC
  Administered 2023-12-20 – 2023-12-22 (×3): 100 mg via ORAL
  Filled 2023-12-20 (×3): qty 1

## 2023-12-20 MED ORDER — SODIUM CHLORIDE 0.9% FLUSH
3.0000 mL | Freq: Two times a day (BID) | INTRAVENOUS | Status: DC
  Administered 2023-12-20 – 2023-12-22 (×4): 3 mL via INTRAVENOUS

## 2023-12-20 MED ORDER — DOCUSATE SODIUM 100 MG PO CAPS
100.0000 mg | ORAL_CAPSULE | Freq: Two times a day (BID) | ORAL | Status: DC
  Administered 2023-12-20 – 2023-12-23 (×6): 100 mg via ORAL
  Filled 2023-12-20 (×6): qty 1

## 2023-12-20 MED ORDER — SENNA 8.6 MG PO TABS: 1.0000 | ORAL_TABLET | Freq: Two times a day (BID) | ORAL | Status: DC | PRN

## 2023-12-20 MED ORDER — TAMSULOSIN HCL 0.4 MG PO CAPS
0.4000 mg | ORAL_CAPSULE | Freq: Every day | ORAL | Status: DC
  Administered 2023-12-20 – 2023-12-23 (×4): 0.4 mg via ORAL
  Filled 2023-12-20 (×4): qty 1

## 2023-12-20 MED ORDER — VANCOMYCIN HCL 2000 MG/400ML IV SOLN
2000.0000 mg | Freq: Once | INTRAVENOUS | Status: AC
Start: 2023-12-20 — End: 2023-12-20
  Administered 2023-12-20: 2000 mg via INTRAVENOUS
  Filled 2023-12-20: qty 400

## 2023-12-20 MED ORDER — ONDANSETRON HCL 4 MG PO TABS: 4.0000 mg | ORAL_TABLET | Freq: Four times a day (QID) | ORAL | Status: DC | PRN

## 2023-12-20 MED ORDER — VANCOMYCIN HCL IN DEXTROSE 1-5 GM/200ML-% IV SOLN: 1000.0000 mg | Freq: Once | INTRAVENOUS | Status: DC

## 2023-12-20 NOTE — H&P (Signed)
 [facility] History and Physical  [name] FMW:[id number] DOB: [birth year] DOA: 12/20/2023  Referring physician: ED  PCP: Arloa Jarvis, NP   Patient is coming from: Assisted living facility  Chief Complaint: Right leg redness swelling and blisters  HPI:  Patient is 73 years old male with past medical history of atrial fibrillation, lymphedema, left BKA, BPH, diabetes mellitus type 2, hyperlipidemia, hypertension, obesity and history of colon cancer presented to hospital with worsening right lower extremity swelling redness, tenderness at the blister rupture site with oozing and blistering.  Patient stated that his leg was doing okay until a week back but 2 days after that he started having some blisters and redness and started wrapping.  When his leg was open today he had significant erythema with a ruptured blisters so was sent from assisted living facility for further evaluation and treatment.  Patient is supposed to follow-up with wound care center in February.  Of note he has been doing Ace wraps at the assisted living facility.  He uses a walker for ambulation.    Patient was admitted to the hospital for cellulitis in the past back in October and had required IV antibiotics.  Patient denies any shortness of breath, chest pain, fever, chills or rigor.  Denies any dizziness, lightheadedness or syncope.  Denies any urinary urgency, frequency or dysuria.  Denies any changes in his bowel habits.  In the ED, patient had stable vitals, afebrile.  Labs showed WBC at 6.1.  Hemoglobin 10.8.  Sodium was slightly low at 133.  Creatinine elevated at 1.3 lactic acid was 1.3.  Patient received vancomycin  and cefepime  in the ED and blood cultures were drawn. Patient was then considered for admission to the hospital.   Assessment and Plan Principal Problem:   Cellulitis Active Problems:   AKI (acute kidney injury) (HCC)   Paroxysmal atrial fibrillation (HCC)   HLD (hyperlipidemia)   DM2  (diabetes mellitus, type 2) (HCC)   BPH (benign prostatic hyperplasia)   CKD (chronic kidney disease) stage 2, GFR 60-89 ml/min   Anemia of chronic disease   Essential hypertension, benign   Morbid obesity (HCC)   Generalized weakness  Right lower extremity cellulitis with significant  erythema and edema with ruptured blister  History of lymphedema, left BKA. No fever or leukocytosis.  Of note, required IV antibiotic in the past in October..  Does have history of lymphedema.  Used to follow-up at the wound care center..  Supposed to receive vancomycin  and cefepime  in the ED.  Patient has nonpurulent moderate cellulitis so we will put the patient on Ancef .  Cellulitis order set utilized.  Will consult wound care.  Might benefit from unaboot on discharge.  Possible mild AKI on CKD stage II.  Hold Lasix  for today due to creatinine of 1.3.  Baseline creatinine around 0.92 months ago.  On Lasix  80 mg daily at home.  Mild hyponatremia.  Will continue to monitor.  On diuretic as outpatient.  Check BMP in AM.  Diabetes mellitus type 2  patient is on glipizide  and metformin  at home.  Will hold oral medication..  Put on sliding scale insulin .  Check fasting lipid profile.,  Last hemoglobin A1c was 7.2.  Continue diabetic diet. ' Hyperlipidemia Continue Lipitor  Hypertension Not on antihypertensive medication.  Patient denies being hypertensive.  Anemia of chronic kidney disease.  Hemoglobin likely at baseline.  Monitor CBC.  No need for transfusion.  Obesity There is no height or weight on file to calculate BMI.  Patient would benefit from weight loss as outpatient.  History of BPH.  Continue tamsulosin .  History of paroxysmal atrial fibrillation.  Continue Eliquis .  Not on nodal blockers at this time.  Generalized weakness, debility.  Will get PT OT evaluation while in the hospital.  Is from assisted living facility.  DVT Prophylaxis: Eliquis   Review of Systems:  All systems were  reviewed and were negative unless otherwise mentioned in the HPI   Past Medical History:  Diagnosis Date   Adenocarcinoma of colon (HCC)    Atrial fibrillation (HCC)    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   Cataract    Constipation, chronic 11/09/2016   Diabetes mellitus without complication (HCC)    History of COVID-19 09/09/2022   History of homelessness 09/09/2022   History of kidney stones 09/09/2022   History of Stercoral ulcer of rectum 11/28/2016   Hyperlipidemia    Hypertension    Lichen planus    bilateral legs   Nausea & vomiting 09/09/2022   Obesity (BMI 30-39.9) 09/09/2022   Personal history of cecal colon cancer 09/09/2022   Past Surgical History:  Procedure Laterality Date   AMPUTATION Left 11/03/2022   Procedure: BELOW KNEE  AMPUTATION, LEFT;  Surgeon: Harden Jerona GAILS, MD;  Location: [facility] OR;  Service: Orthopedics;  Laterality: Left;   APPENDECTOMY     APPLICATION OF WOUND VAC Left 11/03/2022   Procedure: APPLICATION OF WOUND VAC;  Surgeon: Harden Jerona GAILS, MD;  Location: [facility] OR;  Service: Orthopedics;  Laterality: Left;   CHOLECYSTECTOMY N/A 09/11/2022   Procedure: LAPAROSCOPIC CHOLECYSTECTOMY;  Surgeon: Vernetta Berg, MD;  Location: [facility] ORS;  Service: General;  Laterality: N/A;   COLON RESECTION N/A 11/22/2016   Procedure: HAND ASSISTED LAPAROSCOPIC COLON RESECTION;  Surgeon: Krystal Russell, MD;  Location: [facility];  Service: General;  Laterality: N/A;   COLONOSCOPY N/A 11/17/2016   Procedure: COLONOSCOPY;  Surgeon: Gwendlyn ONEIDA Buddy, MD;  Location: [facility];  Service: Endoscopy;  Laterality: N/A;   COLONOSCOPY WITH PROPOFOL  N/A 08/29/2023   Procedure: COLONOSCOPY WITH PROPOFOL ;  Surgeon: Abran Norleen SAILOR, MD;  Location: [facility];  Service: Gastroenterology;  Laterality: N/A;   FRACTURE SURGERY     MULTIPLE TOOTH EXTRACTIONS     RECTAL EXAM UNDER ANESTHESIA N/A 12/05/2016   Procedure: RECTAL EXAM UNDER ANESTHESIA, DISIMPACTION;  Surgeon: Alm Angle, MD;  Location: [facility];  Service: General;  Laterality: N/A;    Social History:  reports that he quit smoking about 40 years ago. His smoking use included pipe. He has never used smokeless tobacco. He reports that he does not drink alcohol  and does not use drugs.  No Known Allergies  Family History  Problem Relation Age of Onset   Brain cancer Mother    Alzheimer's disease Father    Cancer Maternal Grandmother    Heart attack Maternal Grandfather    Pneumonia Paternal Grandfather    Colon cancer Neg Hx    Esophageal cancer Neg Hx    Stomach cancer Neg Hx      Prior to Admission medications   Medication Sig Start Date End Date Taking? Authorizing Provider  acetaminophen  (TYLENOL ) 325 MG tablet Take 650 mg by mouth every 4 (four) hours as needed for moderate pain (pain score 4-6), fever or mild pain (pain score 1-3).    [provider]  apixaban  (ELIQUIS ) 5 MG TABS tablet Take 1 tablet (5 mg total) by mouth 2 (two) times daily. 02/02/22   Walker, Caitlin S, NP  atorvastatin  (LIPITOR) 20 MG tablet Take 20 mg by mouth every evening. 02/02/21   [provider]  docusate sodium  (COLACE) 100 MG capsule Take 1 capsule (100 mg total) by mouth 2 (two) times daily. 12/08/16   Ricky Fines, MD  ferrous sulfate  (FEROSUL) 325 (65 FE) MG tablet Take 325 mg by mouth daily.    [provider]  fluocinolone 0.01 % cream Apply 1 Application topically 2 (two) times daily. Apply to lower right leg 05/04/23   [provider]  furosemide  (LASIX ) 80 MG tablet Take 80 mg by mouth daily. 08/26/22   [provider]  glipiZIDE  (GLUCOTROL  XL) 10 MG 24 hr tablet Take 10 mg by mouth daily.    [provider]  Glucagon (GVOKE HYPOPEN 1-PACK) 1 MG/0.2ML SOAJ Inject 1 mg into the skin as needed (hypoglycemia).    [provider]  hydrOXYzine  (ATARAX /VISTARIL ) 25 MG tablet Take 1 tablet (25 mg total) by mouth every 8 (eight) hours as needed for itching.  Use caution as this medication may make you drowsy Patient taking differently: Take 25 mg by mouth every 8 (eight) hours as needed for itching. 07/17/20   Little, Vernell Search, MD  metFORMIN  (GLUCOPHAGE -XR) 500 MG 24 hr tablet Take 1,000 mg by mouth in the morning and at bedtime.    [provider]  NYAMYC  powder Apply 1 Application topically 2 (two) times daily. 06/08/23   [provider]  polyethylene glycol (MIRALAX ) packet Take 17 g by mouth daily. Hold for diarrhea Patient taking differently: Take 17 g by mouth daily as needed for mild constipation, moderate constipation or severe constipation. 12/08/16   Ricky Fines, MD  potassium chloride  SA (KLOR-CON  M) 20 MEQ tablet Take 20 mEq by mouth daily. 08/26/22   [provider]  senna (SENOKOT) 8.6 MG TABS tablet Take 1 tablet by mouth every 12 (twelve) hours as needed for mild constipation or moderate constipation.    [provider]  tamsulosin  (FLOMAX ) 0.4 MG CAPS capsule Take 1 capsule (0.4 mg total) by mouth daily. Patient taking differently: Take 0.4 mg by mouth every evening. 11/27/16   Ricky Fines, MD  traZODone  (DESYREL ) 100 MG tablet Take 100 mg by mouth at bedtime.    [provider]  triamcinolone (KENALOG) 0.1 % Apply 1 Application topically as needed (rash).    [provider]    Physical Exam:  Vitals:   12/20/23 1551 12/20/23 1923 12/20/23 1930  BP: 135/66 (!) 155/68 131/66  Pulse: 79 79 76  Resp: 16 18 18   Temp: 98.7 F (37.1 C) 98.2 F (36.8 C) 98.2 F (36.8 C)  TempSrc: Oral Oral Oral  SpO2: 96% 99% 99%   Wt Readings from Last 3 Encounters:  10/11/23 120.2 kg  08/29/23 117.9 kg  12/27/22 115.2 kg   There is no height or weight on file to calculate BMI.  General: Obese, not in obvious distress HENT: Normocephalic, No scleral pallor or icterus noted. Oral mucosa is moist.  Chest:  Clear breath sounds.  . No crackles or wheezes.  CVS: S1 &S2 heard. No  murmur.   Abdomen: Soft, nontender, obese abdomen.  Bowel sounds are heard. No abdominal mass palpated Extremities: No cyanosis, clubbing with lower extremity edema erythema ruptured blisters with raw denuded area on the right, left below-knee amputation.   Psych: Alert, awake and oriented, normal mood CNS:  No cranial nerve deficits.  Moves extremities Skin: See redness and skin changes in the right lower extremity below.  On admission.        Labs on Admission:   CBC: Recent Labs  Lab 12/20/23 1843  WBC 6.1  NEUTROABS 5.0  HGB 10.8*  HCT 31.9*  MCV 104.2*  PLT 263    Basic Metabolic Panel: Recent Labs  Lab 12/20/23 1843  NA 133*  K 4.2  CL 97*  CO2 27  GLUCOSE 119*  BUN 25*  CREATININE 1.32*  CALCIUM  8.7*    Liver Function Tests: No results for input(s): AST, ALT, ALKPHOS, BILITOT, PROT, ALBUMIN in the last 168 hours. No results for input(s): LIPASE, AMYLASE in the last 168 hours. No results for input(s): AMMONIA in the last 168 hours.  Cardiac Enzymes: No results for input(s): CKTOTAL, CKMB, CKMBINDEX, TROPONINI in the last 168 hours.  BNP (last 3 results) Recent Labs    06/10/23 1521  BNP 77.5    ProBNP (last 3 results) No results for input(s): PROBNP in the last 8760 hours.  CBG: No results for input(s): GLUCAP in the last 168 hours.  Lipase     Component Value Date/Time   LIPASE 36 09/09/2022 1042     Urinalysis    Component Value Date/Time   COLORURINE YELLOW 09/09/2022 1051   APPEARANCEUR CLEAR 09/09/2022 1051   LABSPEC 1.008 09/09/2022 1051   PHURINE 6.0 09/09/2022 1051   GLUCOSEU NEGATIVE 09/09/2022 1051   HGBUR NEGATIVE 09/09/2022 1051   BILIRUBINUR NEGATIVE 09/09/2022 1051   BILIRUBINUR NEG 05/27/2013 1516   KETONESUR NEGATIVE 09/09/2022 1051   PROTEINUR NEGATIVE 09/09/2022 1051   UROBILINOGEN 0.2 05/27/2013 1516   NITRITE NEGATIVE 09/09/2022 1051   LEUKOCYTESUR NEGATIVE 09/09/2022 1051      Drugs of Abuse     Component Value Date/Time   LABOPIA NONE DETECTED 11/08/2016 1400   COCAINSCRNUR NONE DETECTED 11/08/2016 1400   LABBENZ NONE DETECTED 11/08/2016 1400   AMPHETMU NONE DETECTED 11/08/2016 1400   THCU NONE DETECTED 11/08/2016 1400   LABBARB NONE DETECTED 11/08/2016 1400      Radiological Exams on Admission: DG Foot Complete Right Result Date: 12/20/2023 CLINICAL DATA:  Swelling in the foot EXAM: RIGHT FOOT COMPLETE - 3+ VIEW COMPARISON:  None Available. 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 Plantar calcaneal spur. Mild spurring of the distal tibial rim. Substantial is a dorsal subcutaneous edema in the forefoot. No bony destructive findings characteristic of osteomyelitis. No malalignment at the Lisfranc joint. No obvious gas observed in the soft tissues. Atheromatous vascular calcifications noted. IMPRESSION: 1. Substantial dorsal subcutaneous edema in the forefoot. No bony destructive findings characteristic of osteomyelitis. 2. 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 3. Plantar calcaneal spur. 4. Atheromatous vascular calcifications. Electronically Signed   By: Ryan Salvage M.D.   On: 12/20/2023 16:41   DG Tibia/Fibula Right Result Date: 12/20/2023 CLINICAL DATA:  Swelling and erythema EXAM: RIGHT TIBIA AND FIBULA - 2 VIEW COMPARISON:  None Available. FINDINGS: Osteoarthritis of the knee. Atheromatous vascular calcifications. Subcutaneous edema along the calf in a circumferential distribution. This tracks down into the ankle. IMPRESSION: 1. Subcutaneous edema along the calf in a circumferential distribution. This tracks down into the ankle. 2. Osteoarthritis of the knee. 3. Atheromatous vascular calcifications. Electronically Signed   By: Ryan Salvage M.D.   On: 12/20/2023 16:39    EKG: Not available for review   Consultant: None  Code Status: Full code  Microbiology blood cultures  Antibiotics:  Ancef   Family Communication:  Patients' condition and plan of care including tests being ordered have been  discussed with the patient who indicate understanding and agree with the plan.   Status is: Inpatient Remains inpatient appropriate because: Need for IV antibiotic, patient from assisted living facility   Severity of Illness: The appropriate patient status for this patient is INPATIENT. Inpatient status is judged to be reasonable and necessary in order to provide the required intensity of service to ensure the patient's safety. The patient's presenting symptoms, physical exam findings, and initial radiographic and laboratory data in the context of their chronic comorbidities is felt to place them at high risk for further clinical deterioration. Furthermore, it is not anticipated that the patient will be medically stable for discharge from the hospital within 2 midnights of admission.  I certify that at the point of admission it is my clinical judgment that the patient will require inpatient hospital care spanning beyond 2 midnights from the point of admission due to high intensity of service, high risk for further deterioration and high frequency of surveillance required.*  Signed, Vernal Alstrom, MD [facility] 12/20/2023

## 2023-12-20 NOTE — ED Provider Notes (Signed)
 [facility] [facility] DEPARTMENT AT [facility] Provider Note   CSN: [id number] Arrival date & time: 12/20/23  1539     History  Chief Complaint  Patient presents with   Leg Pain    [name] is a 73 y.o. male past medical history significant for lymphedema, Left BKA, and DM presents today for right leg pain and sores x 2 days.  Patient states he had previously been admitted for IV antibiotics for similar presentation back in October.  Patient endorses redness, seeping wounds that started as blisters, and pain.  Patient denies chest pain, shortness of breath, numbness, tingling, fever, or chills.   Leg Pain      Home Medications Prior to Admission medications   Medication Sig Start Date End Date Taking? Authorizing Provider  acetaminophen  (TYLENOL ) 325 MG tablet Take 650 mg by mouth every 4 (four) hours as needed for moderate pain (pain score 4-6), fever or mild pain (pain score 1-3).    [provider]  apixaban  (ELIQUIS ) 5 MG TABS tablet Take 1 tablet (5 mg total) by mouth 2 (two) times daily. 02/02/22   Walker, Caitlin S, NP  atorvastatin  (LIPITOR) 20 MG tablet Take 20 mg by mouth every evening. 02/02/21   [provider]  docusate sodium  (COLACE) 100 MG capsule Take 1 capsule (100 mg total) by mouth 2 (two) times daily. 12/08/16   Ricky Fines, MD  ferrous sulfate  (FEROSUL) 325 (65 FE) MG tablet Take 325 mg by mouth daily.    [provider]  fluocinolone 0.01 % cream Apply 1 Application topically 2 (two) times daily. Apply to lower right leg 05/04/23   [provider]  furosemide  (LASIX ) 80 MG tablet Take 80 mg by mouth daily. 08/26/22   [provider]  glipiZIDE  (GLUCOTROL  XL) 10 MG 24 hr tablet Take 10 mg by mouth daily.    [provider]  Glucagon (GVOKE HYPOPEN 1-PACK) 1 MG/0.2ML SOAJ Inject 1 mg into the skin as needed (hypoglycemia).    [provider]  hydrOXYzine  (ATARAX /VISTARIL ) 25 MG  tablet Take 1 tablet (25 mg total) by mouth every 8 (eight) hours as needed for itching. Use caution as this medication may make you drowsy Patient taking differently: Take 25 mg by mouth every 8 (eight) hours as needed for itching. 07/17/20   Little, Vernell Search, MD  metFORMIN  (GLUCOPHAGE -XR) 500 MG 24 hr tablet Take 1,000 mg by mouth in the morning and at bedtime.    [provider]  NYAMYC  powder Apply 1 Application topically 2 (two) times daily. 06/08/23   [provider]  polyethylene glycol (MIRALAX ) packet Take 17 g by mouth daily. Hold for diarrhea Patient taking differently: Take 17 g by mouth daily as needed for mild constipation, moderate constipation or severe constipation. 12/08/16   Ricky Fines, MD  potassium chloride  SA (KLOR-CON  M) 20 MEQ tablet Take 20 mEq by mouth daily. 08/26/22   [provider]  senna (SENOKOT) 8.6 MG TABS tablet Take 1 tablet by mouth every 12 (twelve) hours as needed for mild constipation or moderate constipation.    [provider]  tamsulosin  (FLOMAX ) 0.4 MG CAPS capsule Take 1 capsule (0.4 mg total) by mouth daily. Patient taking differently: Take 0.4 mg by mouth every evening. 11/27/16   Ricky Fines, MD  traZODone  (DESYREL ) 100 MG tablet Take 100 mg by mouth at bedtime.    [provider]  triamcinolone (KENALOG) 0.1 % Apply 1 Application topically as needed (rash).  [provider]      Allergies    Patient has no known allergies.    Review of Systems   Review of Systems  Musculoskeletal:  Positive for arthralgias.  Skin:  Positive for wound.    Physical Exam Updated Vital Signs BP 131/66   Pulse 76   Temp 98.2 F (36.8 C) (Oral)   Resp 18   SpO2 99%  Physical Exam Vitals and nursing note reviewed.  Constitutional:      General: He is not in acute distress.    Appearance: Normal appearance. He is well-developed.  HENT:     Head: Normocephalic and atraumatic.     Right Ear:  External ear normal.     Left Ear: External ear normal.  Eyes:     Extraocular Movements: Extraocular movements intact.     Conjunctiva/sclera: Conjunctivae normal.  Cardiovascular:     Rate and Rhythm: Normal rate and regular rhythm.     Pulses: Normal pulses.     Heart sounds: Normal heart sounds. No murmur heard. Pulmonary:     Effort: Pulmonary effort is normal. No respiratory distress.     Breath sounds: Normal breath sounds.  Abdominal:     Palpations: Abdomen is soft.     Tenderness: There is no abdominal tenderness.  Musculoskeletal:        General: Tenderness present. No swelling.     Cervical back: Normal range of motion and neck supple.     Right lower leg: Edema present.     Comments: Left BKA, dorsalis pedis Doppler found on right foot.  Skin:    General: Skin is warm and dry.     Capillary Refill: Capillary refill takes less than 2 seconds.     Comments: Patient has chronic skin changes on right foot and erythema up to patient's right knee with various open wounds at the patient states started as blisters seeping serous fluid.  Leg is warm to touch.  Neurological:     General: No focal deficit present.     Mental Status: He is alert.     Motor: No weakness.  Psychiatric:        Mood and Affect: Mood normal.    RLE exam:       ED Results / Procedures / Treatments   Labs (all labs ordered are listed, but only abnormal results are displayed) Labs Reviewed  BASIC METABOLIC PANEL - Abnormal; Notable for the following components:      Result Value   Sodium 133 (*)    Chloride 97 (*)    Glucose, Bld 119 (*)    BUN 25 (*)    Creatinine, Ser 1.32 (*)    Calcium  8.7 (*)    GFR, Estimated 57 (*)    All other components within normal limits  CBC WITH DIFFERENTIAL/PLATELET - Abnormal; Notable for the following components:   RBC 3.06 (*)    Hemoglobin 10.8 (*)    HCT 31.9 (*)    MCV 104.2 (*)    MCH 35.3 (*)    All other components within normal limits   CULTURE, BLOOD (ROUTINE X 2)  CULTURE, BLOOD (ROUTINE X 2)  BASIC METABOLIC PANEL  CBC  TSH  LIPID PANEL  HEMOGLOBIN A1C  I-STAT CG4 LACTIC ACID, ED  I-STAT CG4 LACTIC ACID, ED    EKG None  Radiology DG Foot Complete Right Result Date: 12/20/2023 CLINICAL DATA:  Swelling in the foot EXAM: RIGHT FOOT COMPLETE - 3+ VIEW COMPARISON:  None Available. 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 Plantar calcaneal spur. Mild spurring of the distal tibial rim. Substantial is a dorsal subcutaneous edema in the forefoot. No bony destructive findings characteristic of osteomyelitis. No malalignment at the Lisfranc joint. No obvious gas observed in the soft tissues. Atheromatous vascular calcifications noted. IMPRESSION: 1. Substantial dorsal subcutaneous edema in the forefoot. No bony destructive findings characteristic of osteomyelitis. 2. 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 3. Plantar calcaneal spur. 4. Atheromatous vascular calcifications. Electronically Signed   By: Ryan Salvage M.D.   On: 12/20/2023 16:41   DG Tibia/Fibula Right Result Date: 12/20/2023 CLINICAL DATA:  Swelling and erythema EXAM: RIGHT TIBIA AND FIBULA - 2 VIEW COMPARISON:  None Available. FINDINGS: Osteoarthritis of the knee. Atheromatous vascular calcifications. Subcutaneous edema along the calf in a circumferential distribution. This tracks down into the ankle. IMPRESSION: 1. Subcutaneous edema along the calf in a circumferential distribution. This tracks down into the ankle. 2. Osteoarthritis of the knee. 3. Atheromatous vascular calcifications. Electronically Signed   By: Ryan Salvage M.D.   On: 12/20/2023 16:39    Procedures Procedures    Medications Ordered in ED Medications  vancomycin  (VANCOREADY) IVPB 2000 mg/400 mL (has no administration in time range)  atorvastatin  (LIPITOR) tablet 20 mg (has no administration in time range)  hydrOXYzine  (ATARAX ) tablet 25  mg (has no administration in time range)  traZODone  (DESYREL ) tablet 100 mg (has no administration in time range)  docusate sodium  (COLACE) capsule 100 mg (has no administration in time range)  polyethylene glycol (MIRALAX  / GLYCOLAX ) packet 17 g (has no administration in time range)  senna (SENOKOT) tablet 8.6 mg (has no administration in time range)  tamsulosin  (FLOMAX ) capsule 0.4 mg (has no administration in time range)  apixaban  (ELIQUIS ) tablet 5 mg (has no administration in time range)  potassium chloride  SA (KLOR-CON  M) CR tablet 20 mEq (has no administration in time range)  sodium chloride  flush (NS) 0.9 % injection 3 mL (has no administration in time range)  sodium chloride  flush (NS) 0.9 % injection 3 mL (has no administration in time range)  0.9 %  sodium chloride  infusion (has no administration in time range)  HYDROcodone -acetaminophen  (NORCO/VICODIN) 5-325 MG per tablet 1-2 tablet (has no administration in time range)  ondansetron  (ZOFRAN ) tablet 4 mg (has no administration in time range)    Or  ondansetron  (ZOFRAN ) injection 4 mg (has no administration in time range)  hydrALAZINE  (APRESOLINE ) injection 5 mg (has no administration in time range)  ceFAZolin  (ANCEF ) IVPB 1 g/50 mL premix (has no administration in time range)  insulin  aspart (novoLOG ) injection 0-9 Units (has no administration in time range)  insulin  aspart (novoLOG ) injection 0-5 Units (has no administration in time range)    ED Course/ Medical Decision Making/ A&P                                 Medical Decision Making  This patient presents to the ED with chief complaint(s) of right leg pain with pertinent past medical history of diabetes, CKD, venous stasis ulcers, which further complicates the presenting complaint. The complaint involves an extensive differential diagnosis and also carries with it a high risk of complications and morbidity.    The differential diagnosis includes osteomyelitis, cellulitis  osteomyelitis, cellulitis, fracture, musculoskeletal pain    Additional history obtained: Records reviewed previous admission documents  ED Course and Reassessment: IV Vanc and  cefepime  ordered Patient requested Tylenol  for pain  Independent labs interpretation:  The following labs were independently interpreted:  CBC: Anemia which is chronic per historical values BMP: Mildly elevated creatinine at 1.32, mildly elevated bun, mild hyponatremia, mild hypochloremia Blood cultures: Pending Lactic acid: 1.3  Independent visualization of imaging: - I independently visualized the following imaging with scope of interpretation limited to determining acute life threatening conditions related to emergency care:  Right tib-fib x-ray: Subcutaneous edema along the calf in a circumferential distribution down to the ankle.  Osteoarthritis of the knee, arthromatous vascular calcifications Right foot x-ray: Substantial dorsal subcutaneous edema in the forefoot, no bony destructive findings characteristic of osteomyelitis  Consultation: - Consulted or discussed management/test interpretation w/ external professional: Hospitalist, Dr. Sonjia he is agreeable to come see the patient in admission for cellulitis of the right leg.  Consideration for admission or further workup: Admission for IV antibiotics for cellulitis of the right leg.        Final Clinical Impression(s) / ED Diagnoses Final diagnoses:  Cellulitis of right lower extremity    Rx / DC Orders ED Discharge Orders     None         Francis Ileana LOISE DEVONNA 12/20/23 2011    Patt Alm Macho, MD 12/22/23 [id number]

## 2023-12-20 NOTE — ED Triage Notes (Signed)
 Pt presents with c/o right leg pain. Pt is from [facility]. Pt was supposed to go to the wound center to get the leg wrapped but they were closed so he is here now.

## 2023-12-20 NOTE — ED Notes (Signed)
 ED TO INPATIENT HANDOFF REPORT  ED Nurse Name and Phone #:  Manuelita MOTE [phone]  S Name/Age/Gender [name] 73 y.o. male Room/Bed: WA07/WA07  Code Status   Code Status: Full Code  Home/SNF/Other Home Patient oriented to: self, place, time, and situation Is this baseline? Yes   Triage Complete: Triage complete  Chief Complaint Cellulitis [L03.90]  Triage Note Pt presents with c/o right leg pain. Pt is from [facility]. Pt was supposed to go to the wound center to get the leg wrapped but they were closed so he is here now.    Allergies No Known Allergies  Level of Care/Admitting Diagnosis ED Disposition     ED Disposition  Admit   Condition  --   Comment  Hospital Area: [facility] [[id number]]  Level of Care: Med-Surg [16]  May admit patient to [facility] or [facility] if equivalent level of care is available:: Yes  Covid Evaluation: Asymptomatic - no recent exposure (last 10 days) testing not required  Diagnosis: Cellulitis [807680]  Admitting Physician: POKHREL, LAXMAN [[id number]]  Attending Physician: POKHREL, LAXMAN [[id number]]  Certification:: I certify this patient will need inpatient services for at least 2 midnights  Expected Medical Readiness: 12/22/2023          B Medical/Surgery History Past Medical History:  Diagnosis Date   Adenocarcinoma of colon (HCC)    Atrial fibrillation (HCC)    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   Cataract    Constipation, chronic 11/09/2016   Diabetes mellitus without complication (HCC)    History of COVID-19 09/09/2022   History of homelessness 09/09/2022   History of kidney stones 09/09/2022   History of Stercoral ulcer of rectum 11/28/2016   Hyperlipidemia    Hypertension    Lichen planus    bilateral legs   Nausea & vomiting 09/09/2022   Obesity (BMI 30-39.9) 09/09/2022   Personal history of cecal colon cancer 09/09/2022   Past Surgical History:  Procedure  Laterality Date   AMPUTATION Left 11/03/2022   Procedure: BELOW KNEE  AMPUTATION, LEFT;  Surgeon: Harden Jerona GAILS, MD;  Location: [facility] OR;  Service: Orthopedics;  Laterality: Left;   APPENDECTOMY     APPLICATION OF WOUND VAC Left 11/03/2022   Procedure: APPLICATION OF WOUND VAC;  Surgeon: Harden Jerona GAILS, MD;  Location: [facility] OR;  Service: Orthopedics;  Laterality: Left;   CHOLECYSTECTOMY N/A 09/11/2022   Procedure: LAPAROSCOPIC CHOLECYSTECTOMY;  Surgeon: Vernetta Berg, MD;  Location: [facility];  Service: General;  Laterality: N/A;   COLON RESECTION N/A 11/22/2016   Procedure: HAND ASSISTED LAPAROSCOPIC COLON RESECTION;  Surgeon: Krystal Russell, MD;  Location: [facility];  Service: General;  Laterality: N/A;   COLONOSCOPY N/A 11/17/2016   Procedure: COLONOSCOPY;  Surgeon: Gwendlyn ONEIDA Buddy, MD;  Location: [facility];  Service: Endoscopy;  Laterality: N/A;   COLONOSCOPY WITH PROPOFOL  N/A 08/29/2023   Procedure: COLONOSCOPY WITH PROPOFOL ;  Surgeon: Abran Norleen SAILOR, MD;  Location: [facility];  Service: Gastroenterology;  Laterality: N/A;   FRACTURE SURGERY     MULTIPLE TOOTH EXTRACTIONS     RECTAL EXAM UNDER ANESTHESIA N/A 12/05/2016   Procedure: RECTAL EXAM UNDER ANESTHESIA, DISIMPACTION;  Surgeon: Alm Angle, MD;  Location: [facility];  Service: General;  Laterality: N/A;     A IV Location/Drains/Wounds Patient Lines/Drains/Airways Status     Active Line/Drains/Airways     Name Placement date Placement time Site Days   Peripheral IV 12/20/23 22 G Posterior;Right Wrist 12/20/23  2007  Wrist  less than 1   Wound / Incision (Open or Dehisced) 10/11/23 Other (Comment) Leg Right cellulitis and patchy areas of partial thickness skin loss 10/11/23  --  Leg  70            Intake/Output Last 24 hours  Intake/Output Summary (Last 24 hours) at 12/20/2023 2033 Last data filed at 12/20/2023 1957 Gross per 24 hour  Intake 0 ml  Output 0 ml  Net 0 ml    Labs/Imaging Results for orders placed or  performed during the hospital encounter of 12/20/23 (from the past 48 hours)  Basic metabolic panel     Status: Abnormal   Collection Time: 12/20/23  6:43 PM  Result Value Ref Range   Sodium 133 (L) 135 - 145 mmol/L   Potassium 4.2 3.5 - 5.1 mmol/L   Chloride 97 (L) 98 - 111 mmol/L   CO2 27 22 - 32 mmol/L   Glucose, Bld 119 (H) 70 - 99 mg/dL    Comment: Glucose reference range applies only to samples taken after fasting for at least 8 hours.   BUN 25 (H) 8 - 23 mg/dL   Creatinine, Ser 8.67 (H) 0.61 - 1.24 mg/dL   Calcium  8.7 (L) 8.9 - 10.3 mg/dL   GFR, Estimated 57 (L) >60 mL/min    Comment: (NOTE) Calculated using the CKD-EPI Creatinine Equation (2021)    Anion gap 9 5 - 15    Comment: Performed at [facility], [address], [place], [place] [address]  CBC with Differential     Status: Abnormal   Collection Time: 12/20/23  6:43 PM  Result Value Ref Range   WBC 6.1 4.0 - 10.5 K/uL   RBC 3.06 (L) 4.22 - 5.81 MIL/uL   Hemoglobin 10.8 (L) 13.0 - 17.0 g/dL   HCT 68.0 (L) 60.9 - 47.9 %   MCV 104.2 (H) 80.0 - 100.0 fL   MCH 35.3 (H) 26.0 - 34.0 pg   MCHC 33.9 30.0 - 36.0 g/dL   RDW 85.0 88.4 - 84.4 %   Platelets 263 150 - 400 K/uL   nRBC 0.0 0.0 - 0.2 %   Neutrophils Relative % 82 %   Neutro Abs 5.0 1.7 - 7.7 K/uL   Lymphocytes Relative 13 %   Lymphs Abs 0.8 0.7 - 4.0 K/uL   Monocytes Relative 3 %   Monocytes Absolute 0.2 0.1 - 1.0 K/uL   Eosinophils Relative 2 %   Eosinophils Absolute 0.1 0.0 - 0.5 K/uL   Basophils Relative 0 %   Basophils Absolute 0.0 0.0 - 0.1 K/uL   Immature Granulocytes 0 %   Abs Immature Granulocytes 0.02 0.00 - 0.07 K/uL    Comment: Performed at [facility], [address] [place], [place] [address]  I-Stat CG4 Lactic Acid     Status: None   Collection Time: 12/20/23  6:52 PM  Result Value Ref Range   Lactic Acid, Venous 1.3 0.5 - 1.9 mmol/L   DG Foot Complete Right Result Date: 12/20/2023 CLINICAL DATA:   Swelling in the foot EXAM: RIGHT FOOT COMPLETE - 3+ VIEW COMPARISON:  None Available. 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 Plantar calcaneal spur. Mild spurring of the distal tibial rim. Substantial is a dorsal subcutaneous edema in the forefoot. No bony destructive findings characteristic of osteomyelitis. No malalignment at the Lisfranc joint. No obvious gas observed in the soft tissues. Atheromatous vascular calcifications noted. IMPRESSION: 1. Substantial dorsal subcutaneous  edema in the forefoot. No bony destructive findings characteristic of osteomyelitis. 2. 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 3. Plantar calcaneal spur. 4. Atheromatous vascular calcifications. Electronically Signed   By: Ryan Salvage M.D.   On: 12/20/2023 16:41   DG Tibia/Fibula Right Result Date: 12/20/2023 CLINICAL DATA:  Swelling and erythema EXAM: RIGHT TIBIA AND FIBULA - 2 VIEW COMPARISON:  None Available. FINDINGS: Osteoarthritis of the knee. Atheromatous vascular calcifications. Subcutaneous edema along the calf in a circumferential distribution. This tracks down into the ankle. IMPRESSION: 1. Subcutaneous edema along the calf in a circumferential distribution. This tracks down into the ankle. 2. Osteoarthritis of the knee. 3. Atheromatous vascular calcifications. Electronically Signed   By: Ryan Salvage M.D.   On: 12/20/2023 16:39    Pending Labs Unresulted Labs (From admission, onward)     Start     Ordered   12/21/23 0500  Basic metabolic panel  Tomorrow morning,   R        12/20/23 1945   12/21/23 0500  CBC  Tomorrow morning,   R        12/20/23 1945   12/21/23 0500  Lipid panel  Tomorrow morning,   R        12/20/23 1945   12/20/23 1943  TSH  Add-on,   AD        12/20/23 1945   12/20/23 1943  Hemoglobin A1c  Once,   R        12/20/23 1945   12/20/23 1706  Blood culture (routine x 2)  BLOOD CULTURE X 2,   R      12/20/23 1705             Vitals/Pain Today's Vitals   12/20/23 1551 12/20/23 1923 12/20/23 1930 12/20/23 1954  BP: 135/66 (!) 155/68 131/66   Pulse: 79 79 76   Resp: 16 18 18    Temp: 98.7 F (37.1 C) 98.2 F (36.8 C) 98.2 F (36.8 C)   TempSrc: Oral Oral Oral   SpO2: 96% 99% 99%   PainSc:    4     Isolation Precautions No active isolations  Medications Medications  vancomycin  (VANCOREADY) IVPB 2000 mg/400 mL (2,000 mg Intravenous New Bag/Given 12/20/23 2012)  atorvastatin  (LIPITOR) tablet 20 mg (20 mg Oral Given 12/20/23 2027)  hydrOXYzine  (ATARAX ) tablet 25 mg (has no administration in time range)  traZODone  (DESYREL ) tablet 100 mg (has no administration in time range)  docusate sodium  (COLACE) capsule 100 mg (has no administration in time range)  polyethylene glycol (MIRALAX  / GLYCOLAX ) packet 17 g (17 g Oral Given 12/20/23 2027)  senna (SENOKOT) tablet 8.6 mg (has no administration in time range)  tamsulosin  (FLOMAX ) capsule 0.4 mg (0.4 mg Oral Given 12/20/23 2027)  apixaban  (ELIQUIS ) tablet 5 mg (has no administration in time range)  potassium chloride  SA (KLOR-CON  M) CR tablet 20 mEq (has no administration in time range)  sodium chloride  flush (NS) 0.9 % injection 3 mL (has no administration in time range)  sodium chloride  flush (NS) 0.9 % injection 3 mL (has no administration in time range)  0.9 %  sodium chloride  infusion (has no administration in time range)  HYDROcodone -acetaminophen  (NORCO/VICODIN) 5-325 MG per tablet 1-2 tablet (2 tablets Oral Given 12/20/23 2027)  ondansetron  (ZOFRAN ) tablet 4 mg (has no administration in time range)    Or  ondansetron  (ZOFRAN ) injection 4 mg (has no administration in time range)  hydrALAZINE  (APRESOLINE ) injection 5 mg (has no administration in time  range)  ceFAZolin  (ANCEF ) IVPB 1 g/50 mL premix (has no administration in time range)  insulin  aspart (novoLOG ) injection 0-9 Units (has no administration in time range)  insulin  aspart (novoLOG ) injection 0-5  Units (has no administration in time range)    Mobility walks with device     Focused Assessments     R Recommendations: See Admitting Provider Note  Report given to:   Additional Notes:

## 2023-12-20 NOTE — ED Provider Triage Note (Addendum)
 Emergency Medicine Provider Triage Evaluation Note  [name] , a 73 y.o. male  was evaluated in triage.  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  Patient states that this has been going on for quite some time however at the wound center they unwrapped it and noticed superficial wounds and that the redness was present on the leg recommend he come to the ED to be treated.  Patient denies fevers, paresthesias, calf tenderness..  Review of Systems  Positive:  Negative:   Physical Exam  BP 135/66 (BP Location: Left Arm)   Pulse 79   Temp 98.7 F (37.1 C) (Oral)   Resp 16   SpO2 96%  Gen:   Awake, no distress   Resp:  Normal effort  MSK:   Moves extremities without difficulty, above-the-knee amputation on the left side Other:  Nonpitting edema noted to right lower leg and right foot with erythema and several superficial ulcers that do not have any purulent material, no fluctuance noted, warmth is noted, no calf pain  Medical Decision Making  Medically screening exam initiated at 3:52 PM.  Appropriate orders placed.  [name] was informed that the remainder of the evaluation will be completed by another provider, this initial triage assessment does not replace that evaluation, and the importance of remaining in the ED until their evaluation is complete.  Workup initiated, patient was neuro vas intact on the right lower leg but does appear to have cellulitis, will get basic labs and imaging, patient stable at this time, no calf pain so ultrasound is not ordered.   Allen Lynwood DASEN, PA-C 12/20/23 1554    Allen Lynwood DASEN, PA-C 12/20/23 1554

## 2023-12-21 DIAGNOSIS — L03115 Cellulitis of right lower limb: Secondary | ICD-10-CM | POA: Diagnosis not present

## 2023-12-21 LAB — BASIC METABOLIC PANEL
Anion gap: 11 (ref 5–15)
BUN: 23 mg/dL (ref 8–23)
CO2: 23 mmol/L (ref 22–32)
Calcium: 8.2 mg/dL — ABNORMAL LOW (ref 8.9–10.3)
Chloride: 97 mmol/L — ABNORMAL LOW (ref 98–111)
Creatinine, Ser: 1.13 mg/dL (ref 0.61–1.24)
GFR, Estimated: >60 mL/min (ref >60)
Glucose, Bld: 154 mg/dL — ABNORMAL HIGH (ref 70–99)
Potassium: 3.8 mmol/L (ref 3.5–5.1)
Sodium: 131 mmol/L — ABNORMAL LOW (ref 135–145)

## 2023-12-21 LAB — BLOOD CULTURE ID PANEL (REFLEXED) - BCID2

## 2023-12-21 LAB — CBC
HCT: 27.7 % — ABNORMAL LOW (ref 39.0–52.0)
Hemoglobin: 9.1 g/dL — ABNORMAL LOW (ref 13.0–17.0)
MCH: 35.1 pg — ABNORMAL HIGH (ref 26.0–34.0)
MCHC: 32.9 g/dL (ref 30.0–36.0)
MCV: 106.9 fL — ABNORMAL HIGH (ref 80.0–100.0)
Platelets: 223 10*3/uL (ref 150–400)
RBC: 2.59 MIL/uL — ABNORMAL LOW (ref 4.22–5.81)
RDW: 14.9 % (ref 11.5–15.5)
WBC: 5 10*3/uL (ref 4.0–10.5)
nRBC: 0 % (ref 0.0–0.2)

## 2023-12-21 LAB — LIPID PANEL
Cholesterol: 75 mg/dL (ref 0–200)
HDL: 31 mg/dL — ABNORMAL LOW (ref >40)
LDL Cholesterol: 32 mg/dL (ref 0–99)
Total CHOL/HDL Ratio: 2.4 {ratio}
Triglycerides: 60 mg/dL (ref <150)
VLDL: 12 mg/dL (ref 0–40)

## 2023-12-21 LAB — GLUCOSE, CAPILLARY
Glucose-Capillary: 143 mg/dL — ABNORMAL HIGH (ref 70–99)
Glucose-Capillary: 150 mg/dL — ABNORMAL HIGH (ref 70–99)
Glucose-Capillary: 166 mg/dL — ABNORMAL HIGH (ref 70–99)
Glucose-Capillary: 133 mg/dL — ABNORMAL HIGH (ref 70–99)

## 2023-12-21 MED ORDER — FUROSEMIDE 40 MG PO TABS
80.0000 mg | ORAL_TABLET | Freq: Every day | ORAL | Status: DC
  Administered 2023-12-21 – 2023-12-22 (×2): 80 mg via ORAL
  Filled 2023-12-21 (×2): qty 2

## 2023-12-21 NOTE — Progress Notes (Signed)
 Physical Therapy Treatment Patient Details Name: [name] MRN: [id number] DOB: 04-24-1951 Today's Date: 12/21/2023   History of Present Illness [name] is a 73 y.o. male admitted with RLE cellulitis. PMH: afib, adenocarcinoma, diabetes, HTN, obesity, lymphedema, L BKA    PT Comments  RN requesting assist with assisting pt back to bed. PT and OT into room for recliner to EOB transfer. Pt able to fully extend BUE on armrests and push through RLE, but needing mod A+2 for safety for final ~6 inches of powering up from low recliner due to tall stature. Once standing, pt able to perform step/hop pivot to EOBw with bariartic RW and CGA +2 for safety. Pt seated EOB with OT as PT exited room.    If plan is discharge home, recommend the following: A little help with walking and/or transfers;A little help with bathing/dressing/bathroom;Assistance with cooking/housework;Assist for transportation   Can travel by private vehicle        Equipment Recommendations  None recommended by PT    Recommendations for Other Services       Precautions / Restrictions Precautions Precautions: Fall Precaution Comments: L BKA, wounds RLE Restrictions Weight Bearing Restrictions Per Provider Order: No     Mobility  Bed Mobility   Transfers Overall transfer level: Needs assistance Equipment used: Rolling walker (2 wheels) Transfers: Sit to/from Stand, Bed to chair/wheelchair/BSC Sit to Stand: Mod assist, +2 safety/equipment   Step pivot transfers: Contact guard assist, +2 safety/equipment       General transfer comment: mod A+2 for safety to power up to stand from low seated recliner and low postioned armrests, therapists stablizing pt and assisting to power up the final ~6 inches due to tall stature; CGA+2 for safety with hop pivot to bed    Ambulation/Gait               General Gait Details: w/c at baseline   Stairs             Wheelchair Mobility     Tilt Bed     Modified Rankin (Stroke Patients Only)       Balance Overall balance assessment: Needs assistance Sitting-balance support: Feet supported Sitting balance-Leahy Scale: Good     Standing balance support: Reliant on assistive device for balance, During functional activity, Bilateral upper extremity supported Standing balance-Leahy Scale: Poor                              Cognition Arousal: Alert Behavior During Therapy: WFL for tasks assessed/performed Overall Cognitive Status: Within Functional Limits for tasks assessed                                          Exercises      General Comments        Pertinent Vitals/Pain Pain Assessment Pain Assessment: Faces Faces Pain Scale: Hurts a little bit Pain Location: generalized with movement Pain Descriptors / Indicators: Grimacing, Discomfort Pain Intervention(s): Limited activity within patient's tolerance, Monitored during session, Repositioned    Home Living Family/patient expects to be discharged to:: Assisted living                 Home Equipment: Wheelchair - Forensic Psychologist (2 wheels) (bariatric RW and w/c)      Prior Function  PT Goals (current goals can now be found in the care plan section) Acute Rehab PT Goals Patient Stated Goal: participate in PT at home PT Goal Formulation: With patient Time For Goal Achievement: 01/04/24 Potential to Achieve Goals: Good Progress towards PT goals: Progressing toward goals    Frequency    Min 1X/week      PT Plan      Co-evaluation PT/OT/SLP Co-Evaluation/Treatment: Yes Reason for Co-Treatment: For patient/therapist safety;To address functional/ADL transfers PT goals addressed during session: Mobility/safety with mobility;Proper use of DME        AM-PAC PT 6 Clicks Mobility   Outcome Measure  Help needed turning from your back to your side while in a flat bed without using bedrails?: A  Little Help needed moving from lying on your back to sitting on the side of a flat bed without using bedrails?: A Little Help needed moving to and from a bed to a chair (including a wheelchair)?: A Lot Help needed standing up from a chair using your arms (e.g., wheelchair or bedside chair)?: A Lot Help needed to walk in hospital room?: Total Help needed climbing 3-5 steps with a railing? : Total 6 Click Score: 12    End of Session Equipment Utilized During Treatment: Gait belt Activity Tolerance: Patient tolerated treatment well Patient left: with call bell/phone within reach;in bed (sitting EOB with OT) Nurse Communication: Mobility status PT Visit Diagnosis: Muscle weakness (generalized) (M62.81);Pain Pain - Right/Left: Right Pain - part of body: Leg     Time: 1218-1232 PT Time Calculation (min) (ACUTE ONLY): 14 min  Charges:    $Therapeutic Activity: 8-22 mins PT General Charges $$ ACUTE PT VISIT: 1 Visit                      Tori Rondalyn Belford PT, DPT 12/21/23, 1:12 PM

## 2023-12-21 NOTE — Progress Notes (Signed)
 PHARMACY - PHYSICIAN COMMUNICATION CRITICAL VALUE ALERT - BLOOD CULTURE IDENTIFICATION (BCID)  [name] is an 73 y.o. male who presented to [facility] on 12/20/2023 with a chief complaint of worsening right lower extremity swelling/redness, found to be cellulitis.   Assessment: 1/4 bottles growing GPC; BCID showing staph species   Name of physician (or Provider) Contacted: Dr. Sonjia  Current antibiotics: cefazolin   Changes to prescribed antibiotics recommended:  No changes at this time - continue current treatment with cefazolin  for cellulitis   Results for orders placed or performed during the hospital encounter of 12/20/23  Blood Culture ID Panel (Reflexed) (Collected: 12/20/2023  8:14 PM)  Result Value Ref Range   Enterococcus faecalis NOT DETECTED NOT DETECTED   Enterococcus Faecium NOT DETECTED NOT DETECTED   Listeria monocytogenes NOT DETECTED NOT DETECTED   Staphylococcus species DETECTED (A) NOT DETECTED   Staphylococcus aureus (BCID) NOT DETECTED NOT DETECTED   Staphylococcus epidermidis NOT DETECTED NOT DETECTED   Staphylococcus lugdunensis NOT DETECTED NOT DETECTED   Streptococcus species NOT DETECTED NOT DETECTED   Streptococcus agalactiae NOT DETECTED NOT DETECTED   Streptococcus pneumoniae NOT DETECTED NOT DETECTED   Streptococcus pyogenes NOT DETECTED NOT DETECTED   A.calcoaceticus-baumannii NOT DETECTED NOT DETECTED   Bacteroides fragilis NOT DETECTED NOT DETECTED   Enterobacterales NOT DETECTED NOT DETECTED   Enterobacter cloacae complex NOT DETECTED NOT DETECTED   Escherichia coli NOT DETECTED NOT DETECTED   Klebsiella aerogenes NOT DETECTED NOT DETECTED   Klebsiella oxytoca NOT DETECTED NOT DETECTED   Klebsiella pneumoniae NOT DETECTED NOT DETECTED   Proteus species NOT DETECTED NOT DETECTED   Salmonella species NOT DETECTED NOT DETECTED   Serratia marcescens NOT DETECTED NOT DETECTED   Haemophilus influenzae NOT DETECTED NOT DETECTED   Neisseria  meningitidis NOT DETECTED NOT DETECTED   Pseudomonas aeruginosa NOT DETECTED NOT DETECTED   Stenotrophomonas maltophilia NOT DETECTED NOT DETECTED   Candida albicans NOT DETECTED NOT DETECTED   Candida auris NOT DETECTED NOT DETECTED   Candida glabrata NOT DETECTED NOT DETECTED   Candida krusei NOT DETECTED NOT DETECTED   Candida parapsilosis NOT DETECTED NOT DETECTED   Candida tropicalis NOT DETECTED NOT DETECTED   Cryptococcus neoformans/gattii NOT DETECTED NOT DETECTED    [name], PharmD Clinical Pharmacist  1/8/20256:13 PM

## 2023-12-21 NOTE — Progress Notes (Signed)
 PROGRESS NOTE  [name] FMW:[id number] DOB: 07/15/1951 DOA: 12/20/2023 PCP: Arloa Jarvis, NP   LOS: 1 day   Brief narrative:  Patient is 73 years old male with past medical history of atrial fibrillation, lymphedema, left BKA, BPH, diabetes mellitus type 2, hyperlipidemia, hypertension, obesity and history of colon cancer presented to the hospital with worsening right lower extremity swelling redness, tenderness at the blister rupture site with oozing and blistering.   Of note he has been doing Ace wraps at the assisted living facility.  He uses a walker for ambulation.    Patient was admitted to the hospital for cellulitis in the past back in October and had required IV antibiotics.  In the ED, patient had stable vitals, afebrile.  Labs showed WBC at 6.1.  Hemoglobin 10.8.  Sodium was slightly low at 133.  Creatinine elevated at 1.3 lactic acid was 1.3.  Patient received vancomycin  and cefepime  in the ED and blood cultures were drawn. Patient was then considered for admission to the hospital.    Assessment/Plan: Principal Problem:   Cellulitis Active Problems:   AKI (acute kidney injury) (HCC)   Paroxysmal atrial fibrillation (HCC)   HLD (hyperlipidemia)   DM2 (diabetes mellitus, type 2) (HCC)   BPH (benign prostatic hyperplasia)   CKD (chronic kidney disease) stage 2, GFR 60-89 ml/min   Anemia of chronic disease   Essential hypertension, benign   Morbid obesity (HCC)   Generalized weakness   Right lower extremity cellulitis with significant  erythema and edema with ruptured blister  History of lymphedema, left BKA. No fever or leukocytosis.  History of IV antibiotic in the past in October..  Does have history of lymphedema.  Used to follow-up at the wound care center..  Received vancomycin  and cefepime  in the ED. continue IV Ancef  for now.  Status post wound care evaluation with wrapping.  Patient will need to follow-up with the wound care center on January 11.  Would benefit  from lymphedema pump as outpatient.  Will follow wound care recommendations on discharge.  Possible mild AKI on CKD stage II.  Patient takes Lasix  80 mg at home.  Creatinine today at 1.1.    Baseline creatinine around 0.92 months ago.  Will resume Lasix  from today.  Mild hyponatremia.  Will continue to monitor.  On diuretic.  Latest sodium of 131.  Diabetes mellitus type 2  patient is on glipizide  and metformin  at home.  Will continue to hold. on sliding scale insulin .  Lipid profile with LDL of 32.  Last hemoglobin A1c was 7.2.  Continue diabetic diet. ' Hyperlipidemia Continue Lipitor  Hypertension Not on antihypertensive medication.  Patient denies being hypertensive.  Anemia of chronic kidney disease.  Latest hemoglobin of 9.1.  Hemoglobin likely at baseline.  Monitor CBC.  No need for transfusion.  Grade 2 obesity Body mass index is 36.19 kg/m.  Patient would benefit from weight loss as outpatient.  History of BPH.  Continue tamsulosin .  History of paroxysmal atrial fibrillation.  Continue Eliquis .  Not on nodal blockers at this time. DVT prophylaxis:  apixaban  (ELIQUIS ) tablet 5 mg   Disposition: Assisted living facility likely in 1 to 2 days  Status is: Inpatient Remains inpatient appropriate because: IV antibiotic, patient from assisted living facility.    Code Status:     Code Status: Full Code  Family Communication: None at bedside  Consultants: None  Procedures: None  Anti-infectives:  Cefazolin  IV  Anti-infectives (From admission, onward)    Start  Dose/Rate Route Frequency Ordered Stop   12/20/23 2200  ceFAZolin  (ANCEF ) IVPB 1 g/50 mL premix        1 g 100 mL/hr over 30 Minutes Intravenous Every 8 hours 12/20/23 2003 12/27/23 2159   12/20/23 1900  vancomycin  (VANCOREADY) IVPB 2000 mg/400 mL        2,000 mg 200 mL/hr over 120 Minutes Intravenous  Once 12/20/23 1845 12/20/23 2235   12/20/23 1845  vancomycin  (VANCOCIN ) IVPB 1000 mg/200 mL premix   Status:  Discontinued        1,000 mg 200 mL/hr over 60 Minutes Intravenous  Once 12/20/23 1841 12/20/23 1845   12/20/23 1845  ceFEPIme  (MAXIPIME ) 2 g in sodium chloride  0.9 % 100 mL IVPB  Status:  Discontinued        2 g 200 mL/hr over 30 Minutes Intravenous  Once 12/20/23 1841 12/20/23 2003        Subjective: Today, patient was seen and examined at bedside.  Patient states that he did have a good night and was able to sleep okay.  Denies overt pain, nausea, vomiting, fever or chills.  Wound care has seen the patient and had undergone wrapping of the lower extremity.  Objective: Vitals:   12/21/23 0210 12/21/23 0706  BP: (!) 142/58 116/60  Pulse: 74 80  Resp: 17 18  Temp: 98.9 F (37.2 C) 98.1 F (36.7 C)  SpO2: 95% 94%    Intake/Output Summary (Last 24 hours) at 12/21/2023 1129 Last data filed at 12/21/2023 1106 Gross per 24 hour  Intake 1186.03 ml  Output 1010 ml  Net 176.03 ml   Filed Weights   12/20/23 2236 12/21/23 0549  Weight: 117.9 kg 133.1 kg   Body mass index is 36.19 kg/m.   Physical Exam: GENERAL: Patient is alert awake and oriented. Not in obvious distress. HENT: No scleral pallor or icterus. Pupils equally reactive to light. Oral mucosa is moist NECK: is supple, no gross swelling noted. CHEST: Clear to auscultation. No crackles or wheezes.  Diminished breath sounds bilaterally. CVS: S1 and S2 heard, no murmur. Regular rate and rhythm.  ABDOMEN: Soft, non-tender, bowel sounds are present. EXTREMITIES: Left below-knee amputation, right lower extremity with wrap. CNS: Cranial nerves are intact. No focal motor deficits. SKIN: warm and dry without rashes.  Data Review: I have personally reviewed the following laboratory data and studies,  CBC: Recent Labs  Lab 12/20/23 1843 12/21/23 0400  WBC 6.1 5.0  NEUTROABS 5.0  --   HGB 10.8* 9.1*  HCT 31.9* 27.7*  MCV 104.2* 106.9*  PLT 263 223   Basic Metabolic Panel: Recent Labs  Lab 12/20/23 1843  12/21/23 0400  NA 133* 131*  K 4.2 3.8  CL 97* 97*  CO2 27 23  GLUCOSE 119* 154*  BUN 25* 23  CREATININE 1.32* 1.13  CALCIUM  8.7* 8.2*   Liver Function Tests: No results for input(s): AST, ALT, ALKPHOS, BILITOT, PROT, ALBUMIN in the last 168 hours. No results for input(s): LIPASE, AMYLASE in the last 168 hours. No results for input(s): AMMONIA in the last 168 hours. Cardiac Enzymes: No results for input(s): CKTOTAL, CKMB, CKMBINDEX, TROPONINI in the last 168 hours. BNP (last 3 results) Recent Labs    06/10/23 1521  BNP 77.5    ProBNP (last 3 results) No results for input(s): PROBNP in the last 8760 hours.  CBG: Recent Labs  Lab 12/20/23 2108 12/21/23 0745  GLUCAP 114* 143*   Recent Results (from the past 240 hours)  Blood  culture (routine x 2)     Status: None (Preliminary result)   Collection Time: 12/20/23  6:43 PM   Specimen: BLOOD  Result Value Ref Range Status   Specimen Description   Final    BLOOD BLOOD RIGHT HAND Performed at [facility], [address] [place], [place] [address]    Special Requests   Final    BOTTLES DRAWN AEROBIC AND ANAEROBIC Blood Culture adequate volume Performed at [facility], [address] [place], [place] [address]    Culture   Final    NO GROWTH < 12 HOURS Performed at [facility], [address], [place], [place] [address]    Report Status PENDING  Incomplete  Blood culture (routine x 2)     Status: None (Preliminary result)   Collection Time: 12/20/23  8:14 PM   Specimen: BLOOD  Result Value Ref Range Status   Specimen Description   Final    BLOOD BLOOD LEFT HAND Performed at [facility], [address], [place], [place] [address]    Special Requests   Final    BOTTLES DRAWN AEROBIC AND ANAEROBIC Blood Culture results may not be optimal due to an inadequate volume of blood received in culture bottles Performed at [facility], [address], [place], [place] [address]    Culture   Final    NO GROWTH < 12 HOURS Performed at [facility], [address], [place], [place] [address]    Report Status PENDING  Incomplete     Studies: DG Foot Complete Right Result Date: 12/20/2023 CLINICAL DATA:  Swelling in the foot EXAM: RIGHT FOOT COMPLETE - 3+ VIEW COMPARISON:  None Available. 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 Plantar calcaneal spur. Mild spurring of the distal tibial rim. Substantial is a dorsal subcutaneous edema in the forefoot. No bony destructive findings characteristic of osteomyelitis. No malalignment at the Lisfranc joint. No obvious gas observed in the soft tissues. Atheromatous vascular calcifications noted. IMPRESSION: 1. Substantial dorsal subcutaneous edema in the forefoot. No bony destructive findings characteristic of osteomyelitis. 2. 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 3. Plantar calcaneal spur. 4. Atheromatous vascular calcifications. Electronically Signed   By: Ryan Salvage M.D.   On: 12/20/2023 16:41   DG Tibia/Fibula Right Result Date: 12/20/2023 CLINICAL DATA:  Swelling and erythema EXAM: RIGHT TIBIA AND FIBULA - 2 VIEW COMPARISON:  None Available. FINDINGS: Osteoarthritis of the knee. Atheromatous vascular calcifications. Subcutaneous edema along the calf in a circumferential distribution. This tracks down into the ankle. IMPRESSION: 1. Subcutaneous edema along the calf in a circumferential distribution. This tracks down into the ankle. 2. Osteoarthritis of the knee. 3. Atheromatous vascular calcifications. Electronically Signed   By: Ryan Salvage M.D.   On: 12/20/2023 16:39      Vernal Alstrom, MD  [facility] 12/21/2023  If 7PM-7AM, please contact night-coverage

## 2023-12-21 NOTE — TOC Initial Note (Addendum)
 Transition of Care [facility]) - Initial/Assessment Note    Patient Details  Name: [name] MRN: [id number] Date of Birth: 1951/09/02  [facility] CM/SW Contact:    Alfonse JONELLE Rex, RN Phone Number: 12/21/2023, 12:00 PM  Clinical Narrative:    Met with pt at bedside to introduce role of TOC/NCM and review for dc planning, reports he has an established PCP and pharmacy, no current home care services, home DME: wheelchair, walker. PT recommendation for [facility] PT, pt agreeable, no preference. TOC will assist with transportation to ALF.   -12:01pm Call to [facility]F, spoke with Hydia, facility administrator, confirmed pt is ALF resident, no FL2 required. Discussed PT recommendation for [facility] PT, request [facility] RN for wound care until pt's outpatient [facility] appt, request sent to attending to place order for [facility] PT/RN. TOC will continue to follow.         -3:53pm Referrals sent out for [facility] PT/RN,.         Expected Discharge Plan: Home w Home Health Services Barriers to Discharge: Continued Medical Work up   Patient Goals and CMS Choice Patient states their goals for this hospitalization and ongoing recovery are:: return to [facility] ALF CMS Medicare.gov Compare Post Acute Care list provided to:: Patient Choice offered to / list presented to : Patient      Expected Discharge Plan and Services       Living arrangements for the past 2 months: Assisted Living Facility                                      Prior Living Arrangements/Services Living arrangements for the past 2 months: Assisted Living Facility Lives with:: Self Patient language and need for interpreter reviewed:: Yes Do you feel safe going back to the place where you live?: Yes      Need for Family Participation in Patient Care: Yes (Comment) Care giver support system in place?: Yes (comment) Current home services: DME (wheelchair, walker) Criminal Activity/Legal Involvement Pertinent to Current  Situation/Hospitalization: No - Comment as needed  Activities of Daily Living   ADL Screening (condition at time of admission) Independently performs ADLs?: Yes (appropriate for developmental age) Is the patient deaf or have difficulty hearing?: No Does the patient have difficulty seeing, even when wearing glasses/contacts?: No Does the patient have difficulty concentrating, remembering, or making decisions?: No  Permission Sought/Granted                  Emotional Assessment Appearance:: Appears stated age Attitude/Demeanor/Rapport: Gracious Affect (typically observed): Accepting Orientation: : Oriented to Self, Oriented to Place, Oriented to  Time, Oriented to Situation Alcohol  / Substance Use: Not Applicable Psych Involvement: No (comment)  Admission diagnosis:  Cellulitis [L03.90] Cellulitis of right lower extremity [L03.115] Patient Active Problem List   Diagnosis Date Noted   Lymphedema 10/12/2023   Cellulitis 10/11/2023   Osteomyelitis of left foot (HCC) 10/30/2022   Cellulitis of left foot 10/27/2022   IDA (iron deficiency anemia) 09/10/2022   Chronic diastolic CHF (congestive heart failure) (HCC) 09/10/2022   CKD (chronic kidney disease) stage 2, GFR 60-89 ml/min 09/09/2022   Chronic anticoagulation 09/09/2022   History of colon cancer 09/09/2022   Anemia of chronic disease 09/09/2022   Venous stasis ulcers- BLE 09/09/2022   Acute cholecystitis due to biliary calculus 09/09/2022   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  Subacute eczematous & allergic contact dermatitis  09/09/2022   History of COVID-19 09/09/2022   History of homelessness 09/09/2022   Diverticulosis of colon 09/09/2022   Cholelithiasis 09/09/2022   Hepatic steatosis 09/09/2022   Living in assisted living 09/09/2022   Pain in right foot 05/06/2022   Atrial fibrillation with slow ventricular response (HCC) 12/23/2021   Pain of joint of left ankle and foot 02/26/2021    BPH (benign prostatic hyperplasia) 11/30/2016   Abnormal CT scan, colon    AKI (acute kidney injury) (HCC)    Generalized weakness    Abdominal pain    Moderate protein-calorie malnutrition (HCC) 11/10/2016   Constipation, chronic 11/09/2016   CKD (chronic kidney disease) stage 3, GFR 30-59 ml/min (HCC) 11/09/2016   Hyperbilirubinemia 11/09/2016   Hyponatremia 11/08/2016   RBBB 09/13/2013   Snoring 09/13/2013   Edema 09/07/2013   DM2 (diabetes mellitus, type 2) (HCC) 03/16/2013   Arthritis 03/16/2013   Essential hypertension, benign 02/23/2013   Morbid obesity (HCC) 02/23/2013   Paroxysmal atrial fibrillation (HCC) 02/23/2013   HLD (hyperlipidemia) 02/23/2013   PCP:  Arloa Jarvis, NP Pharmacy:   [facility] [address] [place] [address] Phone: [phone] Fax: [phone]     Social Drivers of Health (SDOH) Social History: SDOH Screenings   Food Insecurity: No Food Insecurity (12/20/2023)  Housing: Low Risk  (12/20/2023)  Transportation Needs: No Transportation Needs (12/20/2023)  Utilities: Not At Risk (12/20/2023)  Social Connections: Moderately Isolated (12/20/2023)  Tobacco Use: Medium Risk (12/20/2023)   SDOH Interventions:     Readmission Risk Interventions    12/21/2023   11:59 AM 10/14/2023    2:54 PM 10/28/2022   12:37 PM  Readmission Risk Prevention Plan  Transportation Screening Complete Complete Complete  PCP or Specialist Appt within 5-7 Days Complete Complete Complete  Home Care Screening Complete Complete Complete  Medication Review (RN CM) Complete Complete Complete

## 2023-12-21 NOTE — Evaluation (Signed)
 Occupational Therapy Evaluation Patient Details Name: [name] MRN: [id number] DOB: May 02, 1951 Today's Date: 12/21/2023   History of Present Illness DONOLD MAROTTO is a 73 y.o. male admitted with RLE cellulitis. PMH: afib, adenocarcinoma, diabetes, HTN, obesity, lymphedema, L BKA   Clinical Impression   Pt presents with decline in function and safety with ADLs and ADL mobility with impaired balance and endurance. PTA pt lived at an ALF and pt reports he was Ind with bathing, dessing, toileting, sponge baths at sink, facility provides medication and meals; pt reports he doesn't need assist from facility staff but they're able to assist as needed . Pt reports that he was Ind with SPTs and was Ind with w/c mobility. Pt currently requires mod A with LB ADLs, and mod A +2 sit - stand. Pt would benefit from acute OT services to address impairments to maximize level of function and safety      If plan is discharge home, recommend the following: A little help with bathing/dressing/bathroom;A little help with walking and/or transfers    Functional Status Assessment  Patient has had a recent decline in their functional status and demonstrates the ability to make significant improvements in function in a reasonable and predictable amount of time.  Equipment Recommendations  None recommended by OT    Recommendations for Other Services       Precautions / Restrictions Precautions Precautions: Fall Precaution Comments: L BKA, wounds RLE Restrictions Weight Bearing Restrictions Per Provider Order: No      Mobility Bed Mobility Overal bed mobility: Needs Assistance Bed Mobility: Sit to Supine       Sit to supine: Supervision   General bed mobility comments: no physical assist, pt using bedrails, increased time and effort    Transfers Overall transfer level: Needs assistance Equipment used: Rolling walker (2 wheels) Transfers: Sit to/from Stand, Bed to chair/wheelchair/BSC Sit  to Stand: Mod assist, +2 safety/equipment     Step pivot transfers: Contact guard assist, +2 safety/equipment     General transfer comment: mod A+2 for safety to power up to stand from low seated recliner and low postioned armrests, therapists stablizing pt and assisting to power up the final ~6 inches due to tall stature; CGA+2 for safety with hop pivot to bed      Balance Overall balance assessment: Needs assistance Sitting-balance support: Feet supported Sitting balance-Leahy Scale: Good     Standing balance support: Reliant on assistive device for balance, During functional activity, Bilateral upper extremity supported Standing balance-Leahy Scale: Poor                             ADL either performed or assessed with clinical judgement   ADL Overall ADL's : Needs assistance/impaired Eating/Feeding: Independent;Sitting   Grooming: Wash/dry hands;Wash/dry face;Sitting;Contact guard assist   Upper Body Bathing: Contact guard assist;Sitting   Lower Body Bathing: Moderate assistance;Sitting/lateral leans   Upper Body Dressing : Contact guard assist;Sitting   Lower Body Dressing: Moderate assistance   Toilet Transfer: Moderate assistance;+2 for safety/equipment;Rolling walker (2 wheels) Toilet Transfer Details (indicate cue type and reason): mod A +2 sit - stand, CGA step pivot Toileting- Clothing Manipulation and Hygiene: Contact guard assist;Sitting/lateral lean       Functional mobility during ADLs: Moderate assistance;Contact guard assist;+2 for physical assistance;Rolling walker (2 wheels)       Vision Baseline Vision/History: 1 Wears glasses Ability to See in Adequate Light: 0 Adequate Patient Visual Report: No change  from baseline       Perception         Praxis         Pertinent Vitals/Pain Pain Assessment Pain Assessment: Faces Faces Pain Scale: Hurts a little bit Pain Location: generalized with movement Pain Descriptors / Indicators:  Grimacing, Discomfort Pain Intervention(s): Monitored during session, Repositioned     Extremity/Trunk Assessment Upper Extremity Assessment Upper Extremity Assessment: Defer to OT evaluation   Lower Extremity Assessment Lower Extremity Assessment: Defer to PT evaluation RLE Deficits / Details: AROM WFL, strength grossly 3+/5 RLE Sensation:  (sensitive in my foot) RLE Coordination: WNL LLE Deficits / Details: BKA, hip and knee AROM WFL, strength grossly 3+/5 LLE Coordination: WNL   Cervical / Trunk Assessment Cervical / Trunk Assessment: Kyphotic   Communication Communication Communication: No apparent difficulties   Cognition Arousal: Alert Behavior During Therapy: WFL for tasks assessed/performed Overall Cognitive Status: Within Functional Limits for tasks assessed                                       General Comments       Exercises     Shoulder Instructions      Home Living Family/patient expects to be discharged to:: Assisted living                             Home Equipment: Wheelchair - Forensic Psychologist (2 wheels)          Prior Functioning/Environment Prior Level of Function : Independent/Modified Independent;Needs assist             Mobility Comments: Pt reports ind with stand pivot transfers using RW from w/c<>elevated bed and w/c<>elevated toilet, self propels/steers w/c around facility, hasn't ambulated since amputation in 2023 ADLs Comments: pt reports Ind with bathing, dessing, toileting, sponge baths at sink, facility provides medication and meals; pt reports he doesn't need assist from facility staff but they're able to assist as needed        OT Problem List: Decreased strength;Impaired balance (sitting and/or standing);Decreased activity tolerance      OT Treatment/Interventions: Self-care/ADL training;DME and/or AE instruction;Therapeutic activities;Balance training;Patient/family education    OT  Goals(Current goals can be found in the care plan section) Acute Rehab OT Goals Patient Stated Goal: go home OT Goal Formulation: With patient Time For Goal Achievement: 01/04/24 ADL Goals Pt Will Perform Grooming: with set-up;sitting Pt Will Perform Upper Body Bathing: with set-up;sitting Pt Will Perform Lower Body Bathing: with min assist;with contact guard assist;sitting/lateral leans Pt Will Perform Upper Body Dressing: with set-up;sitting Pt Will Perform Lower Body Dressing: with min assist;with contact guard assist;sitting/lateral leans Pt Will Transfer to Toilet: with min assist;with contact guard assist;stand pivot transfer Pt Will Perform Toileting - Clothing Manipulation and hygiene: with contact guard assist;with supervision;sitting/lateral leans  OT Frequency: Min 2X/week    Co-evaluation PT/OT/SLP Co-Evaluation/Treatment: Yes Reason for Co-Treatment: For patient/therapist safety;To address functional/ADL transfers PT goals addressed during session: Mobility/safety with mobility;Proper use of DME OT goals addressed during session: ADL's and self-care;Proper use of Adaptive equipment and DME      AM-PAC OT 6 Clicks Daily Activity     Outcome Measure Help from another person eating meals?: None Help from another person taking care of personal grooming?: A Little Help from another person toileting, which includes using toliet, bedpan, or urinal?: A Little Help from  another person bathing (including washing, rinsing, drying)?: A Lot Help from another person to put on and taking off regular upper body clothing?: A Little Help from another person to put on and taking off regular lower body clothing?: A Lot 6 Click Score: 17   End of Session Equipment Utilized During Treatment: Gait belt;Rolling walker (2 wheels)  Activity Tolerance: Patient tolerated treatment well Patient left: in bed;with call bell/phone within reach  OT Visit Diagnosis: Unsteadiness on feet  (R26.81);Other abnormalities of gait and mobility (R26.89)                Time: 8775-8752 OT Time Calculation (min): 23 min Charges:  OT General Charges $OT Visit: 1 Visit OT Evaluation $OT Eval Moderate Complexity: 1 Mod OT Treatments $Therapeutic Activity: 8-22 mins    Jacques Karna Loose 12/21/2023, 1:59 PM

## 2023-12-21 NOTE — Consult Note (Addendum)
 WOC Nurse Consult Note: this patient is familiar to [facility] team from previous admissions; was followed at [facility] for lymphedema with recurrent venous ulcerations; says he was discharged from [facility] 07/2023 and then was doing daily Xeroform wraps at home which he could not do but had another person doing, says this was very difficult and too expensive for him to do on an ongoing basis  Reason for Consult: R lower extremity cellulitis  Wound type: full thickness r/t venous insufficiency  Pressure Injury POA: NA  Measurement: patient with multiple areas of full thickness largest R lateral leg 7 cm x 7 cm x 0.1 cm 100% pink moist weeping; right above foot 1 cm x 4 cm x 0.1 cm 100% red dry; R anterior 2 cm x 2 cm 100% red dry; R medial 3 cm x 4 cm x 0.1 cm 80% red moist 20% dark eschar  Wound bed: as above  Drainage (amount, consistency, odor) weeping moderate amount of serous/serosanguinous fluid  Periwound: edema, erythema, skin changes consistent with lymphedema, some areas of dry hyperkeratotic skin  Dressing procedure/placement/frequency: Cleanse entire R lower leg with Vashe wound cleanser Soila [phone]), apply Xeroform gauze Soila [phone])  to cover wound beds anterior and posterior leg, cover with ABD pads. Wrap with Kerlix roll gauze beginning just above toes and ending right below knee.  Secure with Ace bandage wrapped in same fashion as Kerlix.    Patient states he has an appointment with [facility] January 11th which I hope he can keep.  We discussed ongoing care for his leg including some type of compression. Patient says he was ordered lymphedema pumps at one point but never heard anything regarding that again.  We discussed compression as mainstay of therapy.  We also discussed Xeroform does not need to be a daily dressing and that ideally wound care center could get him in some type of wound care that is once or twice a week.    POC discussed with patient and bedside nurse. WOC team will not follow.  Re-consult if further needs arise.   Thank you,    Powell Bar MSN, RN-BC, [employer] [phone]

## 2023-12-21 NOTE — Evaluation (Signed)
 Physical Therapy Evaluation Patient Details Name: [name] MRN: [id number] DOB: 07/10/1951 Today's Date: 12/21/2023  History of Present Illness  [name] is a 73 y.o. male admitted with RLE cellulitis. PMH: afib, adenocarcinoma, diabetes, HTN, obesity, lymphedema, L BKA  Clinical Impression  Pt admitted with above diagnosis. Pt reports PLOF ind with stand/step pivot transfers using bariatric RW and bariatric w/c, ind with toileting and sponge baths. Pt reports AL staff available to assist if needed but pt hasn't needed assist, also states they provide meals and medication management. On eval, pt comes to sitting EOB with supv, increased effort. Pt powers to stand from significantly elevated bed, CGA+2 for safety, strong BUE assist to power up, once standing pt maintains stati stance for 30-60 sec before needing seated rest break. Pt needing total A with LE dressing and pericare when standing. Pt completes step/hop pivot to recliner at bedside, min A+2 for safety. Once in recliner, pt needing maxA+2 to power to stand due to low seated recliner and low positioned armrests. Educated pt and nursing on STEDY for return to bed or drop arm recliner for lateral scoot back to bed. Recommend HHPT and 24/7 assist at ALF at d/c. Pt currently with functional limitations due to the deficits listed below (see PT Problem List). Pt will benefit from acute skilled PT to increase their independence and safety with mobility to allow discharge.           If plan is discharge home, recommend the following: A little help with walking and/or transfers;A little help with bathing/dressing/bathroom;Assistance with cooking/housework;Assist for transportation   Can travel by private vehicle        Equipment Recommendations None recommended by PT  Recommendations for Other Services       Functional Status Assessment Patient has had a recent decline in their functional status and demonstrates the ability to  make significant improvements in function in a reasonable and predictable amount of time.     Precautions / Restrictions        Mobility  Bed Mobility Overal bed mobility: Needs Assistance Bed Mobility: Supine to Sit     Supine to sit: Supervision, HOB elevated, Used rails     General bed mobility comments: no physical assist, pt using bedrails and elevated HOB, increased effort    Transfers Overall transfer level: Needs assistance Equipment used: Rolling walker (2 wheels) Transfers: Sit to/from Stand, Bed to chair/wheelchair/BSC Sit to Stand: Contact guard assist, +2 safety/equipment, From elevated surface   Step pivot transfers: Min assist, +2 safety/equipment       General transfer comment: CGA+2 for safety to power up to stand from elevated bed for 2 reps, pt pushing with RLE and BUE assisting; min A+2 to steady with hop-pivot to reclier at bedside; once in recliner, pt needing +2 to safely power up to standing due to low seated recliner and low positioned armrests compared to pt's height    Ambulation/Gait               General Gait Details: w/c at baseline  Stairs            Wheelchair Mobility     Tilt Bed    Modified Rankin (Stroke Patients Only)       Balance Overall balance assessment: Needs assistance Sitting-balance support: Feet supported Sitting balance-Leahy Scale: Good     Standing balance support: Reliant on assistive device for balance, During functional activity, Bilateral upper extremity supported Standing balance-Leahy Scale: Poor  Pertinent Vitals/Pain Pain Assessment Pain Assessment: Faces Faces Pain Scale: Hurts a little bit Pain Location: generalized with movement Pain Descriptors / Indicators: Grimacing, Discomfort Pain Intervention(s): Limited activity within patient's tolerance, Monitored during session, Repositioned    Home Living Family/patient expects to be discharged  to:: Assisted living                 Home Equipment: Wheelchair - Forensic Psychologist (2 wheels) (bariatric RW and w/c)      Prior Function Prior Level of Function : Independent/Modified Independent;Needs assist             Mobility Comments: Pt reports ind with stand pivot transfers using RW from w/c<>elevated bed and w/c<>elevated toilet, self propels/steers w/c around facility, hasn't ambulated since amputation in 2023 ADLs Comments: pt reports ind with toileting, sponge baths at sink, facility provides medication and meals; pt reports he doesn't need assist from facility staff but they're able to assist as needed     Extremity/Trunk Assessment   Upper Extremity Assessment Upper Extremity Assessment: Defer to OT evaluation    Lower Extremity Assessment Lower Extremity Assessment: RLE deficits/detail;LLE deficits/detail RLE Deficits / Details: AROM WFL, strength grossly 3+/5 RLE Sensation:  (sensitive in my foot) RLE Coordination: WNL LLE Deficits / Details: BKA, hip and knee AROM WFL, strength grossly 3+/5 LLE Coordination: WNL    Cervical / Trunk Assessment Cervical / Trunk Assessment: Kyphotic  Communication   Communication Communication: No apparent difficulties  Cognition Arousal: Alert Behavior During Therapy: WFL for tasks assessed/performed Overall Cognitive Status: Within Functional Limits for tasks assessed                                          General Comments      Exercises     Assessment/Plan    PT Assessment Patient needs continued PT services  PT Problem List Decreased strength;Decreased activity tolerance;Decreased balance;Decreased knowledge of use of DME;Impaired sensation;Pain;Decreased skin integrity;Obesity       PT Treatment Interventions DME instruction;Functional mobility training;Therapeutic activities;Therapeutic exercise;Balance training;Patient/family education;Wheelchair mobility training    PT  Goals (Current goals can be found in the Care Plan section)  Acute Rehab PT Goals Patient Stated Goal: participate in PT at home PT Goal Formulation: With patient Time For Goal Achievement: 01/04/24 Potential to Achieve Goals: Good    Frequency Min 1X/week     Co-evaluation               AM-PAC PT 6 Clicks Mobility  Outcome Measure Help needed turning from your back to your side while in a flat bed without using bedrails?: A Little Help needed moving from lying on your back to sitting on the side of a flat bed without using bedrails?: A Little Help needed moving to and from a bed to a chair (including a wheelchair)?: Total Help needed standing up from a chair using your arms (e.g., wheelchair or bedside chair)?: A Lot Help needed to walk in hospital room?: Total Help needed climbing 3-5 steps with a railing? : Total 6 Click Score: 11    End of Session Equipment Utilized During Treatment: Gait belt Activity Tolerance: Patient tolerated treatment well Patient left: in chair;with call bell/phone within reach;with chair alarm set Nurse Communication: Mobility status;Need for lift equipment PT Visit Diagnosis: Muscle weakness (generalized) (M62.81);Pain Pain - Right/Left: Right Pain - part of body: Leg    Time:  9061-8985 PT Time Calculation (min) (ACUTE ONLY): 36 min   Charges:   PT Evaluation $PT Eval Moderate Complexity: 1 Mod PT Treatments $Therapeutic Activity: 8-22 mins PT General Charges $$ ACUTE PT VISIT: 1 Visit          Tori Devany Aja PT, DPT 12/21/23, 10:54 AM

## 2023-12-22 DIAGNOSIS — L03115 Cellulitis of right lower limb: Secondary | ICD-10-CM | POA: Diagnosis not present

## 2023-12-22 LAB — CBC
HCT: 30.6 % — ABNORMAL LOW (ref 39.0–52.0)
Hemoglobin: 9.6 g/dL — ABNORMAL LOW (ref 13.0–17.0)
MCH: 34 pg (ref 26.0–34.0)
MCHC: 31.4 g/dL (ref 30.0–36.0)
MCV: 108.5 fL — ABNORMAL HIGH (ref 80.0–100.0)
Platelets: 199 10*3/uL (ref 150–400)
RBC: 2.82 MIL/uL — ABNORMAL LOW (ref 4.22–5.81)
RDW: 14.9 % (ref 11.5–15.5)
WBC: 3.8 10*3/uL — ABNORMAL LOW (ref 4.0–10.5)
nRBC: 0 % (ref 0.0–0.2)

## 2023-12-22 LAB — BASIC METABOLIC PANEL
Anion gap: 11 (ref 5–15)
BUN: 23 mg/dL (ref 8–23)
CO2: 25 mmol/L (ref 22–32)
Calcium: 8.2 mg/dL — ABNORMAL LOW (ref 8.9–10.3)
Chloride: 98 mmol/L (ref 98–111)
Creatinine, Ser: 1.41 mg/dL — ABNORMAL HIGH (ref 0.61–1.24)
GFR, Estimated: 53 mL/min — ABNORMAL LOW (ref >60)
Glucose, Bld: 176 mg/dL — ABNORMAL HIGH (ref 70–99)
Potassium: 3.8 mmol/L (ref 3.5–5.1)
Sodium: 134 mmol/L — ABNORMAL LOW (ref 135–145)

## 2023-12-22 LAB — GLUCOSE, CAPILLARY
Glucose-Capillary: 132 mg/dL — ABNORMAL HIGH (ref 70–99)
Glucose-Capillary: 151 mg/dL — ABNORMAL HIGH (ref 70–99)
Glucose-Capillary: 156 mg/dL — ABNORMAL HIGH (ref 70–99)
Glucose-Capillary: 186 mg/dL — ABNORMAL HIGH (ref 70–99)

## 2023-12-22 LAB — HEMOGLOBIN A1C
Hgb A1c MFr Bld: 7 % — ABNORMAL HIGH (ref 4.8–5.6)
Mean Plasma Glucose: 154 mg/dL

## 2023-12-22 MED ORDER — CEFAZOLIN SODIUM-DEXTROSE 2-4 GM/100ML-% IV SOLN
2.0000 g | Freq: Three times a day (TID) | INTRAVENOUS | Status: DC
Start: 2023-12-22 — End: 2023-12-23
  Administered 2023-12-22 – 2023-12-23 (×3): 2 g via INTRAVENOUS
  Filled 2023-12-22 (×3): qty 100

## 2023-12-22 NOTE — Plan of Care (Signed)
  Problem: Education: Goal: Ability to describe self-care measures that may prevent or decrease complications (Diabetes Survival Skills Education) will improve 12/22/2023 0730 by Nikki Harlene CROME, RN Outcome: Progressing 12/22/2023 0730 by Nikki Harlene CROME, RN Outcome: Progressing   Problem: Activity: Goal: Risk for activity intolerance will decrease Outcome: Progressing   Problem: Nutrition: Goal: Adequate nutrition will be maintained Outcome: Progressing

## 2023-12-22 NOTE — Progress Notes (Addendum)
 PROGRESS NOTE  [name] FMW:[id number] DOB: [birth year] DOA: 12/20/2023 PCP: Arloa Jarvis, NP   LOS: 2 days   Brief narrative:  Patient is 73 years old male with past medical history of atrial fibrillation, lymphedema, left BKA, BPH, diabetes mellitus type 2, hyperlipidemia, hypertension, obesity and history of colon cancer presented to the hospital with worsening right lower extremity swelling redness, tenderness at the blister rupture site with oozing and blistering.   Of note he has been doing Ace wraps at the assisted living facility.  He uses a walker for ambulation.    Patient was admitted to the hospital for cellulitis in the past back in October and had required IV antibiotics.  In the ED, patient had stable vitals, afebrile.  Labs showed WBC at 6.1.  Hemoglobin 10.8.  Sodium was slightly low at 133.  Creatinine elevated at 1.3 lactic acid was 1.3.  Patient received vancomycin  and cefepime  in the ED and blood cultures were drawn. Patient was then considered for admission to the hospital.    Assessment/Plan: Principal Problem:   Cellulitis Active Problems:   AKI (acute kidney injury) (HCC)   Paroxysmal atrial fibrillation (HCC)   HLD (hyperlipidemia)   DM2 (diabetes mellitus, type 2) (HCC)   BPH (benign prostatic hyperplasia)   CKD (chronic kidney disease) stage 2, GFR 60-89 ml/min   Anemia of chronic disease   Essential hypertension, benign   Morbid obesity (HCC)   Generalized weakness   Right lower extremity cellulitis with significant  erythema and edema with ruptured blister  History of lymphedema, left BKA. No fever or leukocytosis.  History of IV antibiotic in the past in October..  Does have history of lymphedema.  Used to follow-up at the wound care center. Continue IV Ancef  for now.  Status post wound care evaluation with wrapping.  Patient will need to follow-up with the wound care center on January 11.  Would benefit from lymphedema pump as outpatient.  Will  follow wound care recommendations on discharge.  Home health PT and RN orders for wound care have been placed in for ALF.  Possible mild AKI on CKD stage II.  Patient takes Lasix  80 mg at home.  Creatinine today at 1.4 from 1.1..    Baseline creatinine around 0.92 months ago.  Hold off with Lasix  for next dose.  Chronic diastolic congestive heart failure.  Currently compensated.  On Lasix  as outpatient will hold for the next dose.  Mild hyponatremia.  Will continue to monitor.  On diuretic.  Latest sodium of 134  Diabetes mellitus type 2  patient is on glipizide  and metformin  at home.  Will continue to hold. on sliding scale insulin .  Lipid profile with LDL of 32.  Last hemoglobin A1c was 7.2.  Continue diabetic diet.  Latest POC glucose of 132. ' Hyperlipidemia Continue Lipitor  Hypertension Not on antihypertensive medication.  Patient denies being hypertensive.  Latest blood pressure of 120/70  Anemia of chronic kidney disease.  Latest hemoglobin of 9.6 from 9.1.  Hemoglobin likely at baseline.  Monitor CBC.  No need for transfusion.  Grade 2 obesity Body mass index is 36.19 kg/m.  Patient would benefit from weight loss as outpatient.  Counseling done on lifestyle modification.  History of BPH.  Continue tamsulosin .  History of paroxysmal atrial fibrillation.  Continue Eliquis .  Not on nodal blockers at this time.  DVT prophylaxis:  apixaban  (ELIQUIS ) tablet 5 mg   Disposition: Assisted living facility likely on 12/23/2023.  Status is: Inpatient Remains  inpatient appropriate because: IV antibiotic, patient from assisted living facility, elevated creatinine.    Code Status:     Code Status: Full Code  Family Communication: None at bedside  Consultants: None  Procedures: None  Anti-infectives:  Cefazolin  IV IV  Anti-infectives (From admission, onward)    Start     Dose/Rate Route Frequency Ordered Stop   12/22/23 1400  ceFAZolin  (ANCEF ) IVPB 2g/100 mL premix         2 g 200 mL/hr over 30 Minutes Intravenous Every 8 hours 12/22/23 0826 12/27/23 2159   12/20/23 2200  ceFAZolin  (ANCEF ) IVPB 1 g/50 mL premix  Status:  Discontinued        1 g 100 mL/hr over 30 Minutes Intravenous Every 8 hours 12/20/23 2003 12/22/23 0826   12/20/23 1900  vancomycin  (VANCOREADY) IVPB 2000 mg/400 mL        2,000 mg 200 mL/hr over 120 Minutes Intravenous  Once 12/20/23 1845 12/20/23 2235   12/20/23 1845  vancomycin  (VANCOCIN ) IVPB 1000 mg/200 mL premix  Status:  Discontinued        1,000 mg 200 mL/hr over 60 Minutes Intravenous  Once 12/20/23 1841 12/20/23 1845   12/20/23 1845  ceFEPIme  (MAXIPIME ) 2 g in sodium chloride  0.9 % 100 mL IVPB  Status:  Discontinued        2 g 200 mL/hr over 30 Minutes Intravenous  Once 12/20/23 1841 12/20/23 2003        Subjective: Today, patient was seen and examined at bedside.  Patient states that he feels okay.  Denies overt pain, nausea, vomiting, fever or chills.    Objective: Vitals:   12/21/23 2158 12/22/23 0529  BP: 125/70 120/70  Pulse: 71 63  Resp: 18 18  Temp: 98.1 F (36.7 C) 97.9 F (36.6 C)  SpO2: 95% 94%    Intake/Output Summary (Last 24 hours) at 12/22/2023 0943 Last data filed at 12/22/2023 0857 Gross per 24 hour  Intake 1400 ml  Output 1160 ml  Net 240 ml   Filed Weights   12/20/23 2236 12/21/23 0549  Weight: 117.9 kg 133.1 kg   Body mass index is 36.19 kg/m.   Physical Exam:  GENERAL: Patient is alert awake and oriented. Not in obvious distress.  Obese built. HENT: No scleral pallor or icterus. Pupils equally reactive to light. Oral mucosa is moist NECK: is supple, no gross swelling noted. CHEST: Clear to auscultation. No crackles or wheezes.  Diminished breath sounds bilaterally. CVS: S1 and S2 heard, no murmur. Regular rate and rhythm.  ABDOMEN: Soft, non-tender, bowel sounds are present. EXTREMITIES: Left below-knee amputation, right lower extremity with wrap with mild erythema on the exposed  skin.. CNS: Cranial nerves are intact. No focal motor deficits. SKIN: warm and dry, left below-knee amputation with right lower extremity on wrap  Data Review: I have personally reviewed the following laboratory data and studies,  CBC: Recent Labs  Lab 12/20/23 1843 12/21/23 0400 12/22/23 0333  WBC 6.1 5.0 3.8*  NEUTROABS 5.0  --   --   HGB 10.8* 9.1* 9.6*  HCT 31.9* 27.7* 30.6*  MCV 104.2* 106.9* 108.5*  PLT 263 223 199   Basic Metabolic Panel: Recent Labs  Lab 12/20/23 1843 12/21/23 0400 12/22/23 0333  NA 133* 131* 134*  K 4.2 3.8 3.8  CL 97* 97* 98  CO2 27 23 25   GLUCOSE 119* 154* 176*  BUN 25* 23 23  CREATININE 1.32* 1.13 1.41*  CALCIUM  8.7* 8.2* 8.2*   Liver Function  Tests: No results for input(s): AST, ALT, ALKPHOS, BILITOT, PROT, ALBUMIN in the last 168 hours. No results for input(s): LIPASE, AMYLASE in the last 168 hours. No results for input(s): AMMONIA in the last 168 hours. Cardiac Enzymes: No results for input(s): CKTOTAL, CKMB, CKMBINDEX, TROPONINI in the last 168 hours. BNP (last 3 results) Recent Labs    06/10/23 1521  BNP 77.5    ProBNP (last 3 results) No results for input(s): PROBNP in the last 8760 hours.  CBG: Recent Labs  Lab 12/21/23 0745 12/21/23 1132 12/21/23 1626 12/21/23 2156 12/22/23 0719  GLUCAP 143* 150* 166* 133* 132*   Recent Results (from the past 240 hours)  Blood culture (routine x 2)     Status: None (Preliminary result)   Collection Time: 12/20/23  6:43 PM   Specimen: BLOOD  Result Value Ref Range Status   Specimen Description   Final    BLOOD BLOOD RIGHT HAND Performed at [facility], [address], [place], [place] [address]    Special Requests   Final    BOTTLES DRAWN AEROBIC AND ANAEROBIC Blood Culture adequate volume Performed at [facility], [address] [place], [place] [address]    Culture   Final    NO GROWTH 2 DAYS Performed  at [facility], [address] [place], [place] [address]    Report Status PENDING  Incomplete  Blood culture (routine x 2)     Status: None (Preliminary result)   Collection Time: 12/20/23  8:14 PM   Specimen: BLOOD LEFT HAND  Result Value Ref Range Status   Specimen Description   Final    BLOOD LEFT HAND Performed at [facility], [address] [place], [place] [address]    Special Requests   Final    BOTTLES DRAWN AEROBIC AND ANAEROBIC Blood Culture results may not be optimal due to an inadequate volume of blood received in culture bottles Performed at [facility], [address] [place], [place] [address]    Culture  Setup Time   Final    GRAM POSITIVE COCCI IN BOTH AEROBIC AND ANAEROBIC BOTTLES CRITICAL RESULT CALLED TO, READ BACK BY AND VERIFIED WITH: PHARMD SYDNEY DAVIS ON 12/21/2023 @ 1811 BY DRT Performed at [facility], [address], [place], [place] [address]    Culture GRAM POSITIVE COCCI  Final   Report Status PENDING  Incomplete  Blood Culture ID Panel (Reflexed)     Status: Abnormal   Collection Time: 12/20/23  8:14 PM  Result Value Ref Range Status   Enterococcus faecalis NOT DETECTED NOT DETECTED Final   Enterococcus Faecium NOT DETECTED NOT DETECTED Final   Listeria monocytogenes NOT DETECTED NOT DETECTED Final   Staphylococcus species DETECTED (A) NOT DETECTED Final    Comment: CRITICAL RESULT CALLED TO, READ BACK BY AND VERIFIED WITH: PHARMD SYDNEY DAVIS ON 12/21/2023 @ 1811 BY DRT    Staphylococcus aureus (BCID) NOT DETECTED NOT DETECTED Final   Staphylococcus epidermidis NOT DETECTED NOT DETECTED Final   Staphylococcus lugdunensis NOT DETECTED NOT DETECTED Final   Streptococcus species NOT DETECTED NOT DETECTED Final   Streptococcus agalactiae NOT DETECTED NOT DETECTED Final   Streptococcus pneumoniae NOT DETECTED NOT DETECTED Final   Streptococcus pyogenes NOT DETECTED NOT DETECTED Final    A.calcoaceticus-baumannii NOT DETECTED NOT DETECTED Final   Bacteroides fragilis NOT DETECTED NOT DETECTED Final   Enterobacterales NOT DETECTED NOT DETECTED Final   Enterobacter cloacae  complex NOT DETECTED NOT DETECTED Final   Escherichia coli NOT DETECTED NOT DETECTED Final   Klebsiella aerogenes NOT DETECTED NOT DETECTED Final   Klebsiella oxytoca NOT DETECTED NOT DETECTED Final   Klebsiella pneumoniae NOT DETECTED NOT DETECTED Final   Proteus species NOT DETECTED NOT DETECTED Final   Salmonella species NOT DETECTED NOT DETECTED Final   Serratia marcescens NOT DETECTED NOT DETECTED Final   Haemophilus influenzae NOT DETECTED NOT DETECTED Final   Neisseria meningitidis NOT DETECTED NOT DETECTED Final   Pseudomonas aeruginosa NOT DETECTED NOT DETECTED Final   Stenotrophomonas maltophilia NOT DETECTED NOT DETECTED Final   Candida albicans NOT DETECTED NOT DETECTED Final   Candida auris NOT DETECTED NOT DETECTED Final   Candida glabrata NOT DETECTED NOT DETECTED Final   Candida krusei NOT DETECTED NOT DETECTED Final   Candida parapsilosis NOT DETECTED NOT DETECTED Final   Candida tropicalis NOT DETECTED NOT DETECTED Final   Cryptococcus neoformans/gattii NOT DETECTED NOT DETECTED Final    Comment: Performed at [facility], [address], [place], [place] [address]     Studies: DG Foot Complete Right Result Date: 12/20/2023 CLINICAL DATA:  Swelling in the foot EXAM: RIGHT FOOT COMPLETE - 3+ VIEW COMPARISON:  None Available. 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 Plantar calcaneal spur. Mild spurring of the distal tibial rim. Substantial is a dorsal subcutaneous edema in the forefoot. No bony destructive findings characteristic of osteomyelitis. No malalignment at the Lisfranc joint. No obvious gas observed in the soft tissues. Atheromatous vascular calcifications noted. IMPRESSION: 1. Substantial dorsal subcutaneous edema in the forefoot. No bony  destructive findings characteristic of osteomyelitis. 2. 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 3. Plantar calcaneal spur. 4. Atheromatous vascular calcifications. Electronically Signed   By: Ryan Salvage M.D.   On: 12/20/2023 16:41   DG Tibia/Fibula Right Result Date: 12/20/2023 CLINICAL DATA:  Swelling and erythema EXAM: RIGHT TIBIA AND FIBULA - 2 VIEW COMPARISON:  None Available. FINDINGS: Osteoarthritis of the knee. Atheromatous vascular calcifications. Subcutaneous edema along the calf in a circumferential distribution. This tracks down into the ankle. IMPRESSION: 1. Subcutaneous edema along the calf in a circumferential distribution. This tracks down into the ankle. 2. Osteoarthritis of the knee. 3. Atheromatous vascular calcifications. Electronically Signed   By: Ryan Salvage M.D.   On: 12/20/2023 16:39      Vernal Alstrom, MD  [facility] 12/22/2023  If 7PM-7AM, please contact night-coverage

## 2023-12-22 NOTE — Progress Notes (Signed)
 PHARMACY NOTE:  ANTIMICROBIAL RENAL DOSAGE ADJUSTMENT  Current antimicrobial regimen includes a mismatch between antimicrobial dosage and estimated renal function.  As per policy approved by the Pharmacy & Therapeutics and Medical Executive Committees, the antimicrobial dosage will be adjusted accordingly.  Current antimicrobial dosage:  Cefazolin  1 g IV q8h  Indication: Cellulitis, BCID + 2/4 GPC (staph species) - cultures still pending  Renal Function:  Estimated Creatinine Clearance: 70.1 mL/min (A) (by C-G formula based on SCr of 1.41 mg/dL (H)). []      On intermittent HD, scheduled: []      On CRRT    Antimicrobial dosage has been changed to:  Cefazolin  2 g IV q8h  [name] [name], PharmD 12/22/23 8:28 AM

## 2023-12-22 NOTE — TOC Progression Note (Signed)
 Transition of Care [facility]) - Progression Note    Patient Details  Name: [name] MRN: [id number] Date of Birth: August 16, 1951  [facility] CM/SW Contact  Alfonse JONELLE Rex, RN Phone Number: 12/22/2023, 2:29 PM  Clinical Narrative:  Call to Hydia, administrative director , [employer], informed unsuccessful in obtaining outside [facility] provider for [facility] PT/RN, Hydia confirmed they have a facility therapist and have arranged for wound care service. Informed plan for return to facility on 1/10, Hydia voiced understanding.     Expected Discharge Plan: Home w Home Health Services Barriers to Discharge: Continued Medical Work up  Expected Discharge Plan and Services       Living arrangements for the past 2 months: Assisted Living Facility                                       Social Determinants of Health (SDOH) Interventions SDOH Screenings   Food Insecurity: No Food Insecurity (12/20/2023)  Housing: Low Risk  (12/20/2023)  Transportation Needs: No Transportation Needs (12/20/2023)  Utilities: Not At Risk (12/20/2023)  Social Connections: Moderately Isolated (12/20/2023)  Tobacco Use: Medium Risk (12/20/2023)    Readmission Risk Interventions    12/21/2023   11:59 AM 10/14/2023    2:54 PM 10/28/2022   12:37 PM  Readmission Risk Prevention Plan  Transportation Screening Complete Complete Complete  PCP or Specialist Appt within 5-7 Days Complete Complete Complete  Home Care Screening Complete Complete Complete  Medication Review (RN CM) Complete Complete Complete

## 2023-12-23 DIAGNOSIS — L03115 Cellulitis of right lower limb: Secondary | ICD-10-CM | POA: Diagnosis not present

## 2023-12-23 LAB — CULTURE, BLOOD (ROUTINE X 2)

## 2023-12-23 LAB — GLUCOSE, CAPILLARY: Glucose-Capillary: 179 mg/dL — ABNORMAL HIGH (ref 70–99)

## 2023-12-23 MED ORDER — CEPHALEXIN 500 MG PO CAPS: 500.0000 mg | ORAL_CAPSULE | Freq: Three times a day (TID) | ORAL | Status: AC

## 2023-12-23 NOTE — Discharge Summary (Signed)
 Physician Discharge Summary  DARELLE KINGS FMW:[id number] DOB: February 28, 1951 DOA: 12/20/2023  PCP: Arloa Jarvis, NP  Admit date: 12/20/2023 Discharge date: 12/23/2023  Admitted From: Assisted living facility  Discharge disposition: Assisted living facility   Recommendations for Outpatient Follow-Up:   Follow up with your primary care provider in one week.  Check CBC, BMP, magnesium  in the next visit Follow-up with the wound care center as has been scheduled.   Discharge Diagnosis:   Principal Problem:   Cellulitis Active Problems:   AKI (acute kidney injury) (HCC)   Paroxysmal atrial fibrillation (HCC)   HLD (hyperlipidemia)   DM2 (diabetes mellitus, type 2) (HCC)   BPH (benign prostatic hyperplasia)   CKD (chronic kidney disease) stage 2, GFR 60-89 ml/min   Anemia of chronic disease   Essential hypertension, benign   Morbid obesity (HCC)   Generalized weakness   Discharge Condition: Improved.  Diet recommendation:   Carbohydrate-modified.    Wound care: See below  Code status: Full.   History of Present Illness:   Patient is 73 years old male with past medical history of atrial fibrillation, lymphedema, left BKA, BPH, diabetes mellitus type 2, hyperlipidemia, hypertension, obesity and history of colon cancer presented to the hospital with worsening right lower extremity swelling redness, tenderness at the blister rupture site with oozing and blistering.   Of note he has been doing Ace wraps at the assisted living facility.  He uses a walker for ambulation.    Patient was admitted to the hospital for cellulitis in the past back in October and had required IV antibiotics.  In the ED, patient had stable vitals, afebrile.  Labs showed WBC at 6.1.  Hemoglobin 10.8.  Sodium was slightly low at 133.  Creatinine elevated at 1.3 lactic acid was 1.3.  Patient received vancomycin  and cefepime  in the ED and blood cultures were drawn. Patient was then considered for admission to the  hospital.    Hospital Course:   Following conditions were addressed during hospitalization as listed below,  Right lower extremity cellulitis with significant  erythema and edema with ruptured blister  History of lymphedema, left BKA. No fever or leukocytosis.  History of IV antibiotic in the past in October..  Does have history of lymphedema.  Used to follow-up at the wound care center.  Patient received IV Ancef  during hospitalization and was seen by wound care, status post wound care evaluation with wrapping.  Patient will need to follow-up with the wound care center as has been scheduled.  Would benefit from lymphedema pump as outpatient.  Will follow wound care recommendations on discharge.  Home health PT and RN orders for wound care have been placed in for ALF.   Possible mild AKI on CKD stage II.  Patient takes Lasix  80 mg at home.  Creatinine 1.4 from 1.1..    Baseline creatinine around 0.92 months ago.  Resume Lasix  on discharge.  Check BMP in few days.   Chronic diastolic congestive heart failure.  Currently compensated.  On Lasix  as outpatient will resume on discharge.   Mild hyponatremia.  Will continue to monitor.  On diuretic.  Latest sodium of 134   Diabetes mellitus type 2  on glipizide  and metformin  at home.  Received sliding scale insulin  during hospitalization.  Lipid profile with LDL of 32.  Last hemoglobin A1c was 7.2.  Continue diabetic diet.  ' Hyperlipidemia Continue Lipitor   Hypertension Not on antihypertensive medication.  Patient denies being hypertensive.  Latest blood pressure of  133/68   Anemia of chronic kidney disease.  Latest hemoglobin of 9.6 from 9.1.  Hemoglobin likely at baseline.  Monitor CBC.  No need for transfusion.   Grade 2 obesity Body mass index is 36.19 kg/m.  Patient would benefit from weight loss as outpatient.  Counseling done on lifestyle modification.   History of BPH.  Continue tamsulosin .   History of paroxysmal atrial  fibrillation.  Continue Eliquis .  Not on nodal blockers at this time.  Disposition.  At this time, patient is stable for disposition to assisted living facility with wound care.  Medical Consultants:   Wound care  Procedures:    Wound care Subjective:   Today, patient was seen and examined at bedside.  Feels better.  Denies any nausea vomiting fever chills or rigor.  Wants to go back to his facility.  Denies overt pain.  Discharge Exam:   Vitals:   12/22/23 2056 12/23/23 0629  BP: 118/62 133/68  Pulse: 68 62  Resp: 17 17  Temp: 98 F (36.7 C) (!) 97.4 F (36.3 C)  SpO2: 96% 97%   Vitals:   12/22/23 1339 12/22/23 2056 12/23/23 0500 12/23/23 0629  BP: 126/62 118/62  133/68  Pulse: 69 68  62  Resp: 15 17  17   Temp: 98.1 F (36.7 C) 98 F (36.7 C)  (!) 97.4 F (36.3 C)  TempSrc:    Oral  SpO2: 99% 96%  97%  Weight:   133.2 kg   Height:       .Body mass index is 36.22 kg/m.  General: Alert awake, not in obvious distress, obese built HENT: pupils equally reacting to light,  No scleral pallor or icterus noted. Oral mucosa is moist.  Chest:  Clear breath sounds.  No crackles or wheezes.  CVS: S1 &S2 heard. No murmur.  Regular rate and rhythm. Abdomen: Soft, nontender, nondistended.  Bowel sounds are heard.   Extremities: Left above-knee amputation right lower extremity with wrap Psych: Alert, awake and oriented, normal mood CNS:  No cranial nerve deficits.  Power equal in all extremities.   Skin: Warm and dry.  Right lower extremity with wrap.  The results of significant diagnostics from this hospitalization (including imaging, microbiology, ancillary and laboratory) are listed below for reference.     Diagnostic Studies:   DG Foot Complete Right Result Date: 12/20/2023 CLINICAL DATA:  Swelling in the foot EXAM: RIGHT FOOT COMPLETE - 3+ VIEW COMPARISON:  None Available. 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 Plantar calcaneal spur. Mild  spurring of the distal tibial rim. Substantial is a dorsal subcutaneous edema in the forefoot. No bony destructive findings characteristic of osteomyelitis. No malalignment at the Lisfranc joint. No obvious gas observed in the soft tissues. Atheromatous vascular calcifications noted. IMPRESSION: 1. Substantial dorsal subcutaneous edema in the forefoot. No bony destructive findings characteristic of osteomyelitis. 2. 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 3. Plantar calcaneal spur. 4. Atheromatous vascular calcifications. Electronically Signed   By: Ryan Salvage M.D.   On: 12/20/2023 16:41   DG Tibia/Fibula Right Result Date: 12/20/2023 CLINICAL DATA:  Swelling and erythema EXAM: RIGHT TIBIA AND FIBULA - 2 VIEW COMPARISON:  None Available. FINDINGS: Osteoarthritis of the knee. Atheromatous vascular calcifications. Subcutaneous edema along the calf in a circumferential distribution. This tracks down into the ankle. IMPRESSION: 1. Subcutaneous edema along the calf in a circumferential distribution. This tracks down into the ankle. 2. Osteoarthritis of the knee. 3. Atheromatous vascular calcifications. Electronically Signed   By:  Ryan Salvage M.D.   On: 12/20/2023 16:39     Labs:   Basic Metabolic Panel: Recent Labs  Lab 12/20/23 1843 12/21/23 0400 12/22/23 0333  NA 133* 131* 134*  K 4.2 3.8 3.8  CL 97* 97* 98  CO2 27 23 25   GLUCOSE 119* 154* 176*  BUN 25* 23 23  CREATININE 1.32* 1.13 1.41*  CALCIUM  8.7* 8.2* 8.2*   GFR Estimated Creatinine Clearance: 70.1 mL/min (A) (by C-G formula based on SCr of 1.41 mg/dL (H)). Liver Function Tests: No results for input(s): AST, ALT, ALKPHOS, BILITOT, PROT, ALBUMIN in the last 168 hours. No results for input(s): LIPASE, AMYLASE in the last 168 hours. No results for input(s): AMMONIA in the last 168 hours. Coagulation profile No results for input(s): INR, PROTIME in the last 168 hours.  CBC: Recent  Labs  Lab 12/20/23 1843 12/21/23 0400 12/22/23 0333  WBC 6.1 5.0 3.8*  NEUTROABS 5.0  --   --   HGB 10.8* 9.1* 9.6*  HCT 31.9* 27.7* 30.6*  MCV 104.2* 106.9* 108.5*  PLT 263 223 199   Cardiac Enzymes: No results for input(s): CKTOTAL, CKMB, CKMBINDEX, TROPONINI in the last 168 hours. BNP: Invalid input(s): POCBNP CBG: Recent Labs  Lab 12/22/23 0719 12/22/23 1215 12/22/23 1632 12/22/23 2057 12/23/23 0810  GLUCAP 132* 151* 156* 186* 179*   D-Dimer No results for input(s): DDIMER in the last 72 hours. Hgb A1c Recent Labs    12/20/23 1843  HGBA1C 7.0*   Lipid Profile Recent Labs    12/21/23 0400  CHOL 75  HDL 31*  LDLCALC 32  TRIG 60  CHOLHDL 2.4   Thyroid  function studies Recent Labs    12/20/23 2014  TSH 3.653   Anemia work up No results for input(s): VITAMINB12, FOLATE, FERRITIN, TIBC, IRON, RETICCTPCT in the last 72 hours. Microbiology Recent Results (from the past 240 hours)  Blood culture (routine x 2)     Status: None (Preliminary result)   Collection Time: 12/20/23  6:43 PM   Specimen: BLOOD  Result Value Ref Range Status   Specimen Description   Final    BLOOD BLOOD RIGHT HAND Performed at [facility], [address], [place], [place] [address]    Special Requests   Final    BOTTLES DRAWN AEROBIC AND ANAEROBIC Blood Culture adequate volume Performed at [facility], [address], [place], [place] [address]    Culture   Final    NO GROWTH 3 DAYS Performed at [facility], [address], [place], [place] [address]    Report Status PENDING  Incomplete  Blood culture (routine x 2)     Status: Abnormal   Collection Time: 12/20/23  8:14 PM   Specimen: BLOOD LEFT HAND  Result Value Ref Range Status   Specimen Description   Final    BLOOD LEFT HAND Performed at [facility], [address], [place], [place] [address]    Special Requests   Final    BOTTLES DRAWN  AEROBIC AND ANAEROBIC Blood Culture results may not be optimal due to an inadequate volume of blood received in culture bottles Performed at [facility], [address] [place], [place] [address]    Culture  Setup Time   Final    GRAM POSITIVE COCCI IN BOTH AEROBIC AND ANAEROBIC BOTTLES CRITICAL RESULT CALLED TO, READ BACK BY AND VERIFIED WITH: PHARMD SYDNEY DAVIS ON 12/21/2023 @ 1811 BY DRT  Culture (A)  Final    STAPHYLOCOCCUS HOMINIS STAPHYLOCOCCUS EPIDERMIDIS THE SIGNIFICANCE OF ISOLATING THIS ORGANISM FROM A SINGLE SET OF BLOOD CULTURES WHEN MULTIPLE SETS ARE DRAWN IS UNCERTAIN. PLEASE NOTIFY THE MICROBIOLOGY DEPARTMENT WITHIN ONE WEEK IF SPECIATION AND SENSITIVITIES ARE REQUIRED. Performed at [facility], [address] [place], [place] [address]    Report Status 12/23/2023 FINAL  Final  Blood Culture ID Panel (Reflexed)     Status: Abnormal   Collection Time: 12/20/23  8:14 PM  Result Value Ref Range Status   Enterococcus faecalis NOT DETECTED NOT DETECTED Final   Enterococcus Faecium NOT DETECTED NOT DETECTED Final   Listeria monocytogenes NOT DETECTED NOT DETECTED Final   Staphylococcus species DETECTED (A) NOT DETECTED Final    Comment: CRITICAL RESULT CALLED TO, READ BACK BY AND VERIFIED WITH: PHARMD SYDNEY DAVIS ON 12/21/2023 @ 1811 BY DRT    Staphylococcus aureus (BCID) NOT DETECTED NOT DETECTED Final   Staphylococcus epidermidis NOT DETECTED NOT DETECTED Final   Staphylococcus lugdunensis NOT DETECTED NOT DETECTED Final   Streptococcus species NOT DETECTED NOT DETECTED Final   Streptococcus agalactiae NOT DETECTED NOT DETECTED Final   Streptococcus pneumoniae NOT DETECTED NOT DETECTED Final   Streptococcus pyogenes NOT DETECTED NOT DETECTED Final   A.calcoaceticus-baumannii NOT DETECTED NOT DETECTED Final   Bacteroides fragilis NOT DETECTED NOT DETECTED Final   Enterobacterales NOT DETECTED NOT DETECTED Final   Enterobacter cloacae  complex NOT DETECTED NOT DETECTED Final   Escherichia coli NOT DETECTED NOT DETECTED Final   Klebsiella aerogenes NOT DETECTED NOT DETECTED Final   Klebsiella oxytoca NOT DETECTED NOT DETECTED Final   Klebsiella pneumoniae NOT DETECTED NOT DETECTED Final   Proteus species NOT DETECTED NOT DETECTED Final   Salmonella species NOT DETECTED NOT DETECTED Final   Serratia marcescens NOT DETECTED NOT DETECTED Final   Haemophilus influenzae NOT DETECTED NOT DETECTED Final   Neisseria meningitidis NOT DETECTED NOT DETECTED Final   Pseudomonas aeruginosa NOT DETECTED NOT DETECTED Final   Stenotrophomonas maltophilia NOT DETECTED NOT DETECTED Final   Candida albicans NOT DETECTED NOT DETECTED Final   Candida auris NOT DETECTED NOT DETECTED Final   Candida glabrata NOT DETECTED NOT DETECTED Final   Candida krusei NOT DETECTED NOT DETECTED Final   Candida parapsilosis NOT DETECTED NOT DETECTED Final   Candida tropicalis NOT DETECTED NOT DETECTED Final   Cryptococcus neoformans/gattii NOT DETECTED NOT DETECTED Final    Comment: Performed at [facility], [address] [place], [place] [address]     Discharge Instructions:   Discharge Instructions     Call MD for:  severe uncontrolled pain   Complete by: As directed    Call MD for:  temperature >100.4   Complete by: As directed    Diet Carb Modified   Complete by: As directed    Discharge instructions   Complete by: As directed    Follow-up with your primary care provider in 1 week.  Check blood work at that time.  Continue to complete the course of antibiotic.  Follow-up with wound care as outpatient.  Continue wound care at the assisted living facility.  Seek medical attention for worsening symptoms.   Discharge wound care:   Complete by: As directed    Cleanse entire R lower leg with Vashe wound cleanser Soila [phone]), apply Xeroform gauze Soila [phone])  to cover wound beds anterior and posterior leg, cover with ABD pads. Wrap  with Kerlix roll gauze beginning just above toes  and ending right below knee.  Secure with Ace bandage wrapped in same fashion as Kerlix. Perform daily while inpatient.   Increase activity slowly   Complete by: As directed       Allergies as of 12/23/2023   No Known Allergies      Medication List     TAKE these medications    acetaminophen  325 MG tablet Commonly known as: TYLENOL  Take 650 mg by mouth every 4 (four) hours as needed for moderate pain (pain score 4-6), fever or mild pain (pain score 1-3).   apixaban  5 MG Tabs tablet Commonly known as: ELIQUIS  Take 1 tablet (5 mg total) by mouth 2 (two) times daily.   atorvastatin  20 MG tablet Commonly known as: LIPITOR Take 20 mg by mouth every evening.   benzonatate 100 MG capsule Commonly known as: TESSALON Take 100 mg by mouth 3 (three) times daily as needed for cough.   cephALEXin  500 MG capsule Commonly known as: KEFLEX  Take 1 capsule (500 mg total) by mouth 3 (three) times daily for 5 days.   docusate sodium  100 MG capsule Commonly known as: COLACE Take 1 capsule (100 mg total) by mouth 2 (two) times daily.   FeroSul 325 (65 FE) MG tablet Generic drug: ferrous sulfate  Take 325 mg by mouth daily.   fluocinolone 0.01 % cream Apply 1 Application topically See admin instructions. Apply a thin layer to lower right leg 2 times a day   furosemide  80 MG tablet Commonly known as: LASIX  Take 80 mg by mouth daily.   glipiZIDE  10 MG 24 hr tablet Commonly known as: GLUCOTROL  XL Take 10 mg by mouth daily.   Gvoke HypoPen 1-Pack 1 MG/0.2ML Soaj Generic drug: Glucagon Inject 1 mg into the skin as needed (for emergency of severe hypoglycemia).   hydrOXYzine  25 MG tablet Commonly known as: ATARAX  Take 1 tablet (25 mg total) by mouth every 8 (eight) hours as needed for itching. Use caution as this medication may make you drowsy What changed:  reasons to take this additional instructions   metFORMIN  500 MG 24 hr  tablet Commonly known as: GLUCOPHAGE -XR Take 1,000 mg by mouth in the morning and at bedtime.   Nyamyc  powder Generic drug: nystatin  Apply 1 Application topically See admin instructions. Apply to affected areas 2 times a day   polyethylene glycol 17 g packet Commonly known as: MiraLax  Take 17 g by mouth daily. Hold for diarrhea What changed:  when to take this additional instructions   potassium chloride  SA 20 MEQ tablet Commonly known as: KLOR-CON  M Take 20 mEq by mouth daily.   tamsulosin  0.4 MG Caps capsule Commonly known as: FLOMAX  Take 1 capsule (0.4 mg total) by mouth daily. What changed: when to take this   traZODone  100 MG tablet Commonly known as: DESYREL  Take 100 mg by mouth at bedtime.               Discharge Care Instructions  (From admission, onward)           Start     Ordered   12/23/23 0000  Discharge wound care:       Comments: Cleanse entire R lower leg with Vashe wound cleanser Soila [phone]), apply Xeroform gauze Soila [phone])  to cover wound beds anterior and posterior leg, cover with ABD pads. Wrap with Kerlix roll gauze beginning just above toes and ending right below knee.  Secure with Ace bandage wrapped in same fashion as Kerlix. Perform daily while inpatient.  12/23/23 0910            Follow-up Information     Arloa Jarvis, NP Follow up in 1 week(s).   Specialty: Nurse Practitioner Contact information: [address] [place] [address] [phone]                  Time coordinating discharge: 39 minutes  Signed:  Daquan Crapps  [facility] 12/23/2023, 9:15 AM

## 2023-12-23 NOTE — Plan of Care (Signed)
 Discharge report called to Hydia, RN at [facility]

## 2023-12-23 NOTE — Plan of Care (Signed)
  Problem: Coping: Goal: Ability to adjust to condition or change in health will improve Outcome: Progressing   Problem: Pain Management: Goal: General experience of comfort will improve Outcome: Progressing   Problem: Safety: Goal: Ability to remain free from injury will improve Outcome: Progressing   Problem: Skin Integrity: Goal: Risk for impaired skin integrity will decrease Outcome: Progressing

## 2023-12-25 LAB — CULTURE, BLOOD (ROUTINE X 2)
Culture: NO GROWTH
Special Requests: ADEQUATE

## 2024-01-24 ENCOUNTER — Encounter (HOSPITAL_BASED_OUTPATIENT_CLINIC_OR_DEPARTMENT_OTHER): Payer: 59 | Attending: Internal Medicine | Admitting: Internal Medicine

## 2024-01-24 DIAGNOSIS — I87311 Chronic venous hypertension (idiopathic) with ulcer of right lower extremity: Secondary | ICD-10-CM | POA: Insufficient documentation

## 2024-01-24 DIAGNOSIS — Z89512 Acquired absence of left leg below knee: Secondary | ICD-10-CM | POA: Insufficient documentation

## 2024-01-24 DIAGNOSIS — L97812 Non-pressure chronic ulcer of other part of right lower leg with fat layer exposed: Secondary | ICD-10-CM | POA: Diagnosis not present

## 2024-01-24 DIAGNOSIS — E11622 Type 2 diabetes mellitus with other skin ulcer: Secondary | ICD-10-CM | POA: Insufficient documentation

## 2024-01-24 DIAGNOSIS — I890 Lymphedema, not elsewhere classified: Secondary | ICD-10-CM | POA: Diagnosis not present

## 2024-01-24 DIAGNOSIS — Z09 Encounter for follow-up examination after completed treatment for conditions other than malignant neoplasm: Secondary | ICD-10-CM | POA: Diagnosis not present

## 2024-01-25 ENCOUNTER — Ambulatory Visit: Payer: 59 | Admitting: Dietician

## 2024-01-27 ENCOUNTER — Ambulatory Visit (HOSPITAL_COMMUNITY)
Admission: RE | Admit: 2024-01-27 | Discharge: 2024-01-27 | Disposition: A | Discharge status: Home / Self Care | Payer: 59 | Source: Ambulatory Visit | Attending: Family | Admitting: Family

## 2024-01-27 DIAGNOSIS — I77810 Thoracic aortic ectasia: Secondary | ICD-10-CM | POA: Insufficient documentation

## 2024-01-27 MED ORDER — SODIUM CHLORIDE (PF) 0.9 % IJ SOLN
INTRAMUSCULAR | Status: AC
  Filled 2024-01-27: qty 50

## 2024-01-27 MED ORDER — IOHEXOL 350 MG/ML SOLN
100.0000 mL | Freq: Once | INTRAVENOUS | Status: AC | PRN
  Administered 2024-01-27: 100 mL via INTRAVENOUS

## 2024-02-01 ENCOUNTER — Encounter (HOSPITAL_BASED_OUTPATIENT_CLINIC_OR_DEPARTMENT_OTHER): Payer: 59 | Admitting: Internal Medicine

## 2024-02-01 DIAGNOSIS — E11622 Type 2 diabetes mellitus with other skin ulcer: Secondary | ICD-10-CM

## 2024-02-01 DIAGNOSIS — I890 Lymphedema, not elsewhere classified: Secondary | ICD-10-CM

## 2024-02-01 DIAGNOSIS — L97812 Non-pressure chronic ulcer of other part of right lower leg with fat layer exposed: Secondary | ICD-10-CM

## 2024-02-01 DIAGNOSIS — I87311 Chronic venous hypertension (idiopathic) with ulcer of right lower extremity: Secondary | ICD-10-CM

## 2024-02-01 DIAGNOSIS — Z09 Encounter for follow-up examination after completed treatment for conditions other than malignant neoplasm: Secondary | ICD-10-CM | POA: Diagnosis not present

## 2024-02-02 ENCOUNTER — Telehealth (HOSPITAL_BASED_OUTPATIENT_CLINIC_OR_DEPARTMENT_OTHER): Payer: Self-pay

## 2024-02-02 DIAGNOSIS — I77810 Thoracic aortic ectasia: Secondary | ICD-10-CM

## 2024-02-02 NOTE — Telephone Encounter (Addendum)
Results called to patient, verbalized understanding. Orders & referral placed!   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 Compared to prior echo 01/2023 with aortic root 45mm, progressed from prior. Recommend continued BP control to prevent progression. Recommend repeat CT Aorta in 6 months and referral to cardiothoracic surgery.

## 2024-02-10 ENCOUNTER — Encounter (HOSPITAL_BASED_OUTPATIENT_CLINIC_OR_DEPARTMENT_OTHER): Payer: 59 | Admitting: Internal Medicine

## 2024-02-10 DIAGNOSIS — I890 Lymphedema, not elsewhere classified: Secondary | ICD-10-CM

## 2024-02-10 DIAGNOSIS — I87311 Chronic venous hypertension (idiopathic) with ulcer of right lower extremity: Secondary | ICD-10-CM | POA: Diagnosis not present

## 2024-02-10 DIAGNOSIS — L97812 Non-pressure chronic ulcer of other part of right lower leg with fat layer exposed: Secondary | ICD-10-CM

## 2024-02-10 DIAGNOSIS — E11622 Type 2 diabetes mellitus with other skin ulcer: Secondary | ICD-10-CM | POA: Diagnosis not present

## 2024-02-10 DIAGNOSIS — Z09 Encounter for follow-up examination after completed treatment for conditions other than malignant neoplasm: Secondary | ICD-10-CM | POA: Diagnosis not present

## 2024-02-17 ENCOUNTER — Encounter: Payer: 59 | Admitting: Thoracic Surgery (Cardiothoracic Vascular Surgery)

## 2024-02-17 ENCOUNTER — Other Ambulatory Visit: Payer: Self-pay | Admitting: Thoracic Surgery (Cardiothoracic Vascular Surgery)

## 2024-02-17 ENCOUNTER — Encounter: Payer: Self-pay | Admitting: Thoracic Surgery (Cardiothoracic Vascular Surgery)

## 2024-02-17 ENCOUNTER — Institutional Professional Consult (permissible substitution): Payer: 59 | Admitting: Thoracic Surgery (Cardiothoracic Vascular Surgery)

## 2024-02-17 VITALS — BP 135/69 | HR 74 | Resp 20 | Ht 75.5 in | Wt 296.0 lb

## 2024-02-17 DIAGNOSIS — I77810 Thoracic aortic ectasia: Secondary | ICD-10-CM

## 2024-02-17 NOTE — Progress Notes (Signed)
 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  This was found incidentally.  He denies any symptoms from cardiovascular standpoint.  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  He has also had a history of chronic lower extremity wounds due to the lymphedema.   Past Medical and Surgical History: Previous Chest Surgery: No Previous Chest Radiation: No Diabetes Mellitus: Yes.  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   Cataract    Constipation, chronic 11/09/2016   Diabetes mellitus without complication (HCC)    History of COVID-19 09/09/2022   History of homelessness 09/09/2022   History of kidney stones 09/09/2022   History of Stercoral ulcer of rectum 11/28/2016   Hyperlipidemia    Hypertension    Lichen planus    bilateral legs   Nausea & vomiting 09/09/2022   Obesity (BMI 30-39.9) 09/09/2022   Personal history of cecal colon cancer 09/09/2022    Past Surgical History:  Procedure Laterality Date   AMPUTATION Left 11/03/2022   Procedure: BELOW KNEE  AMPUTATION, LEFT;  Surgeon: Nadara Mustard, MD;  Location: [facility] OR;  Service: Orthopedics;  Laterality: Left;   APPENDECTOMY     APPLICATION OF WOUND VAC Left 11/03/2022   Procedure:  APPLICATION OF WOUND VAC;  Surgeon: Nadara Mustard, MD;  Location: [facility] OR;  Service: Orthopedics;  Laterality: Left;   CHOLECYSTECTOMY N/A 09/11/2022   Procedure: LAPAROSCOPIC CHOLECYSTECTOMY;  Surgeon: Abigail Miyamoto, MD;  Location: [facility] ORS;  Service: General;  Laterality: N/A;   COLON RESECTION N/A 11/22/2016   Procedure: HAND ASSISTED LAPAROSCOPIC COLON RESECTION;  Surgeon: Avel Peace, MD;  Location: [facility];  Service: General;  Laterality: N/A;   COLONOSCOPY N/A 11/17/2016   Procedure: COLONOSCOPY;  Surgeon: Meryl Dare, MD;  Location: [facility]COPY;  Service: Endoscopy;  Laterality: N/A;   COLONOSCOPY WITH PROPOFOL N/A 08/29/2023   Procedure: COLONOSCOPY WITH PROPOFOL;  Surgeon: Hilarie Fredrickson, MD;  Location: [facility]OS[facility];  Service: Gastroenterology;  Laterality: N/A;   FRACTURE SURGERY     MULTIPLE TOOTH EXTRACTIONS     RECTAL EXAM UNDER ANESTHESIA N/A 12/05/2016   Procedure: RECTAL EXAM UNDER ANESTHESIA, DISIMPACTION;  Surgeon: Ovidio Kin, MD;  Location: [facility] ORS;  Service: General;  Laterality: N/A;    Social History: Support: Lives at assisted living  Social History   Tobacco Use  Smoking Status Former   Types: Pipe   Quit date: 09/14/1983   Years since quitting: 40.4  Smokeless Tobacco Never    Social History   Substance and Sexual Activity  Alcohol Use No     No Known Allergies  Medications:  Anti-Coagulation: eliquis  Current Outpatient Medications  Medication Sig Dispense Refill   acetaminophen (TYLENOL) 325 MG tablet Take 650 mg by mouth every 4 (four) hours as needed for moderate pain (pain score 4-6), fever or mild pain (pain score 1-3).     apixaban (ELIQUIS) 5 MG TABS tablet Take 1 tablet (5 mg total) by mouth 2 (two) times daily. 60 tablet 5   atorvastatin (LIPITOR) 20 MG tablet Take 20 mg by mouth every evening.     benzonatate (TESSALON) 100 MG capsule Take 100 mg by mouth 3 (three) times daily as needed for cough.     docusate sodium (COLACE) 100  MG capsule Take 1 capsule (100 mg total) by mouth 2 (two) times daily.     ferrous sulfate (FEROSUL) 325 (65 FE) MG tablet Take 325 mg by mouth daily.     fluocinolone 0.01 % cream Apply 1 Application topically See admin instructions. Apply a thin layer to lower right leg 2 times a day     furosemide (LASIX) 80 MG tablet Take 80 mg by mouth daily.     glipiZIDE (GLUCOTROL XL) 10 MG 24 hr tablet Take 10 mg by mouth daily.     Glucagon (GVOKE HYPOPEN 1-PACK) 1 MG/0.2ML SOAJ Inject 1 mg into the skin as needed (for emergency of severe hypoglycemia).     hydrOXYzine (ATARAX/VISTARIL) 25 MG tablet Take 1 tablet (25 mg total) by mouth every 8 (eight) hours as needed for itching. Use caution as this medication may make you drowsy (Patient taking differently: Take 25 mg by mouth every 8 (eight) hours as needed for itching (per MAR).) 10 tablet 0   metFORMIN (GLUCOPHAGE-XR) 500 MG 24 hr tablet Take 1,000 mg by mouth in the morning and at bedtime.     NYAMYC powder Apply 1 Application topically See admin instructions. Apply to affected areas 2 times a day     polyethylene glycol (MIRALAX) packet Take 17 g by mouth daily. Hold for diarrhea (Patient taking differently: Take 17 g by mouth See admin instructions. Mix 17 grams into 8 ounces of water, coffee, tea, or juice and drink by mouth once a day)     potassium chloride SA (KLOR-CON M) 20 MEQ tablet Take 20 mEq by mouth daily.     tamsulosin (FLOMAX) 0.4 MG CAPS capsule Take 1 capsule (0.4 mg total) by mouth daily. (Patient taking differently: Take 0.4 mg by mouth every evening.)     traZODone (DESYREL) 100 MG tablet Take 100 mg by mouth at bedtime.     No current facility-administered medications for this visit.    (Not in a hospital admission)   Family History  Problem Relation Age of Onset   Brain cancer Mother    Alzheimer's disease Father    Cancer Maternal Grandmother    Heart attack Maternal Grandfather    Pneumonia Paternal Grandfather     Colon cancer Neg Hx    Esophageal cancer Neg Hx    Stomach cancer Neg Hx      Review of Systems:   Review of Systems  Constitutional:  Negative for malaise/fatigue.  Respiratory:  Negative for shortness of breath.   Cardiovascular:  Negative for chest pain.  Neurological: Negative.       Physical Exam: BP 135/69 (BP Location: Left Arm, Patient Position: Sitting, Cuff Size: Large)   Pulse 74   Resp 20   Ht 6' 3.5" (1.918 m)   Wt 296 lb (134.3 kg)   SpO2 95% Comment:  RA  BMI 36.51 kg/m  Physical Exam Constitutional:      General: He is not in acute distress.    Appearance: He is not ill-appearing.  HENT:     Head: Normocephalic and atraumatic.  Eyes:     Extraocular Movements: Extraocular movements intact.  Cardiovascular:     Rate and Rhythm: Normal rate.  Pulmonary:     Effort: Pulmonary effort is normal. No respiratory distress.  Abdominal:     General: There is no distension.  Musculoskeletal:        General: Normal range of motion.  Skin:    General: Skin is warm and dry.  Neurological:     General: No focal deficit present.     Mental Status: He is alert and oriented to person, place, and time.       Diagnostic Studies & Laboratory data:    CT chest: FINDINGS: Cardiovascular: The thoracic aorta is dilated measuring 5.5 x 4.3 cm the level of the sinuses of Valsalva (see radiologist created reformat.). The thoracic aorta is otherwise of normal caliber measuring 3.8 cm in diameter in its ascending segment 2.9 cm in its proximal descending segment and 2.4 cm in diameter at its distal descending segment. No intramural hematoma or dissection. Arch vasculature demonstrates variant anatomy with separate origin the left vertebral artery directly from the aorta. Arch vasculature demonstrates wide patency proximally.   Moderate multi-vessel coronary artery calcification. Global cardiac size within normal limits. The central pulmonary arteries mildly dilated  suggesting changes of pulmonary arterial hypertension. No pericardial effusion.   Echo: IMPRESSIONS     1. Left ventricular ejection fraction, by estimation, is 55 to 60%. The  left ventricle has normal function. Left ventricular endocardial border  not optimally defined to evaluate regional wall motion. Left ventricular  diastolic function could not be  evaluated.   2. Right ventricular systolic function is normal. The right ventricular  size is normal. Tricuspid regurgitation signal is inadequate for assessing  PA pressure.   3. Left atrial size was mildly dilated.   4. The mitral valve is grossly normal. Trivial mitral valve  regurgitation. No evidence of mitral stenosis.   5. The aortic valve is tricuspid. Aortic valve regurgitation is not  visualized. No aortic stenosis is present.   6. Aortic dilatation noted. Aneurysm of the aortic root, measuring 45 mm.   7. The inferior vena cava is normal in size with greater than 50%  respiratory variability, suggesting right atrial pressure of 3 mmHg.  EKG: afib  I have independently reviewed the above radiologic studies and discussed with the patient   Recent Lab Findings: Lab Results  Component Value Date   WBC 3.8 (L) 12/22/2023   HGB 9.6 (L) 12/22/2023   HCT 30.6 (L) 12/22/2023   PLT 199 12/22/2023   GLUCOSE 176 (H) 12/22/2023   CHOL 75 12/21/2023   TRIG 60 12/21/2023   HDL 31 (L) 12/21/2023   LDLCALC 32 12/21/2023   ALT 21 11/10/2022   AST 26 11/10/2022   NA 134 (L) 12/22/2023   K 3.8 12/22/2023   CL 98 12/22/2023   CREATININE 1.41 (H) 12/22/2023   BUN 23 12/22/2023   CO2 25 12/22/2023   TSH 3.653 12/20/2023   INR 1.3 (H) 10/28/2022   HGBA1C 7.0 (H) 12/20/2023      Assessment / Plan:   73yo male  with a 5.5 cm aortic root aneurysm.  Echocardiogram shows a tricuspid valve without evidence of regurgitation.  This echocardiogram is  over a-year-old.  I have ordered a new echo.  His current living situation is  quite complicated, and he has multiple comorbidities which would put him at risk of postoperative infections.  Although he states that he does not have any open wounds on his legs currently, this has been a chronic and random issue.  We will continue to follow his aneurysm for now.    I  spent 40 minutes counseling the patient face to face.   Corliss Skains 02/17/2024 2:54 PM

## 2024-03-14 ENCOUNTER — Ambulatory Visit: Payer: 59 | Admitting: Dietician

## 2024-03-16 ENCOUNTER — Ambulatory Visit (HOSPITAL_COMMUNITY): Admission: RE | Admit: 2024-03-16 | Source: Ambulatory Visit

## 2024-04-09 ENCOUNTER — Ambulatory Visit (HOSPITAL_COMMUNITY)
Admission: RE | Admit: 2024-04-09 | Discharge: 2024-04-09 | Disposition: A | Discharge status: Home / Self Care | Source: Ambulatory Visit | Attending: Thoracic Surgery (Cardiothoracic Vascular Surgery) | Admitting: Thoracic Surgery (Cardiothoracic Vascular Surgery)

## 2024-04-09 DIAGNOSIS — E1122 Type 2 diabetes mellitus with diabetic chronic kidney disease: Secondary | ICD-10-CM | POA: Insufficient documentation

## 2024-04-09 DIAGNOSIS — I130 Hypertensive heart and chronic kidney disease with heart failure and stage 1 through stage 4 chronic kidney disease, or unspecified chronic kidney disease: Secondary | ICD-10-CM | POA: Diagnosis not present

## 2024-04-09 DIAGNOSIS — I509 Heart failure, unspecified: Secondary | ICD-10-CM | POA: Diagnosis not present

## 2024-04-09 DIAGNOSIS — E785 Hyperlipidemia, unspecified: Secondary | ICD-10-CM | POA: Diagnosis not present

## 2024-04-09 DIAGNOSIS — I7121 Aneurysm of the ascending aorta, without rupture: Secondary | ICD-10-CM | POA: Diagnosis not present

## 2024-04-09 DIAGNOSIS — I77810 Thoracic aortic ectasia: Secondary | ICD-10-CM | POA: Insufficient documentation

## 2024-04-09 LAB — ECHOCARDIOGRAM COMPLETE: Area-P 1/2: 3.56 cm2

## 2024-04-21 ENCOUNTER — Encounter (HOSPITAL_COMMUNITY): Payer: Self-pay

## 2024-04-21 ENCOUNTER — Observation Stay (HOSPITAL_COMMUNITY)
Admission: EM | Admit: 2024-04-21 | Discharge: 2024-04-22 | Disposition: A | Discharge status: Skilled Nursing Facility | Attending: Internal Medicine | Admitting: Internal Medicine

## 2024-04-21 ENCOUNTER — Other Ambulatory Visit: Payer: Self-pay

## 2024-04-21 DIAGNOSIS — K317 Polyp of stomach and duodenum: Secondary | ICD-10-CM | POA: Diagnosis not present

## 2024-04-21 DIAGNOSIS — X58XXXA Exposure to other specified factors, initial encounter: Secondary | ICD-10-CM | POA: Insufficient documentation

## 2024-04-21 DIAGNOSIS — D510 Vitamin B12 deficiency anemia due to intrinsic factor deficiency: Secondary | ICD-10-CM

## 2024-04-21 DIAGNOSIS — Z87891 Personal history of nicotine dependence: Secondary | ICD-10-CM | POA: Diagnosis not present

## 2024-04-21 DIAGNOSIS — D500 Iron deficiency anemia secondary to blood loss (chronic): Secondary | ICD-10-CM | POA: Diagnosis not present

## 2024-04-21 DIAGNOSIS — Z7901 Long term (current) use of anticoagulants: Secondary | ICD-10-CM | POA: Insufficient documentation

## 2024-04-21 DIAGNOSIS — I4891 Unspecified atrial fibrillation: Secondary | ICD-10-CM | POA: Diagnosis not present

## 2024-04-21 DIAGNOSIS — K921 Melena: Secondary | ICD-10-CM | POA: Diagnosis present

## 2024-04-21 DIAGNOSIS — S81811A Laceration without foreign body, right lower leg, initial encounter: Secondary | ICD-10-CM

## 2024-04-21 DIAGNOSIS — Z8616 Personal history of COVID-19: Secondary | ICD-10-CM | POA: Insufficient documentation

## 2024-04-21 DIAGNOSIS — N400 Enlarged prostate without lower urinary tract symptoms: Secondary | ICD-10-CM | POA: Insufficient documentation

## 2024-04-21 DIAGNOSIS — R195 Other fecal abnormalities: Secondary | ICD-10-CM | POA: Diagnosis not present

## 2024-04-21 DIAGNOSIS — I482 Chronic atrial fibrillation, unspecified: Secondary | ICD-10-CM | POA: Diagnosis not present

## 2024-04-21 DIAGNOSIS — Z79899 Other long term (current) drug therapy: Secondary | ICD-10-CM | POA: Diagnosis not present

## 2024-04-21 DIAGNOSIS — Z7984 Long term (current) use of oral hypoglycemic drugs: Secondary | ICD-10-CM | POA: Diagnosis not present

## 2024-04-21 DIAGNOSIS — I5032 Chronic diastolic (congestive) heart failure: Secondary | ICD-10-CM | POA: Insufficient documentation

## 2024-04-21 DIAGNOSIS — K922 Gastrointestinal hemorrhage, unspecified: Principal | ICD-10-CM | POA: Insufficient documentation

## 2024-04-21 DIAGNOSIS — D509 Iron deficiency anemia, unspecified: Principal | ICD-10-CM | POA: Insufficient documentation

## 2024-04-21 DIAGNOSIS — E785 Hyperlipidemia, unspecified: Secondary | ICD-10-CM | POA: Diagnosis not present

## 2024-04-21 DIAGNOSIS — E119 Type 2 diabetes mellitus without complications: Secondary | ICD-10-CM

## 2024-04-21 DIAGNOSIS — K3189 Other diseases of stomach and duodenum: Secondary | ICD-10-CM | POA: Diagnosis not present

## 2024-04-21 DIAGNOSIS — D519 Vitamin B12 deficiency anemia, unspecified: Secondary | ICD-10-CM

## 2024-04-21 DIAGNOSIS — Z85038 Personal history of other malignant neoplasm of large intestine: Secondary | ICD-10-CM | POA: Insufficient documentation

## 2024-04-21 DIAGNOSIS — D62 Acute posthemorrhagic anemia: Secondary | ICD-10-CM | POA: Diagnosis present

## 2024-04-21 DIAGNOSIS — D539 Nutritional anemia, unspecified: Secondary | ICD-10-CM | POA: Diagnosis not present

## 2024-04-21 HISTORY — DX: Lymphedema, not elsewhere classified: I89.0

## 2024-04-21 HISTORY — DX: Congenital aneurysm of aorta: Q25.43

## 2024-04-21 LAB — CBC WITH DIFFERENTIAL/PLATELET
Abs Immature Granulocytes: 0.02 10*3/uL (ref 0.00–0.07)
Basophils Absolute: 0 10*3/uL (ref 0.0–0.1)
Basophils Relative: 0 %
Eosinophils Absolute: 0.1 10*3/uL (ref 0.0–0.5)
Eosinophils Relative: 2 %
HCT: 22.3 % — ABNORMAL LOW (ref 39.0–52.0)
Hemoglobin: 6.9 g/dL — CL (ref 13.0–17.0)
Immature Granulocytes: 0 %
Lymphocytes Relative: 10 %
Lymphs Abs: 0.6 10*3/uL — ABNORMAL LOW (ref 0.7–4.0)
MCH: 35.2 pg — ABNORMAL HIGH (ref 26.0–34.0)
MCHC: 30.9 g/dL (ref 30.0–36.0)
MCV: 113.8 fL — ABNORMAL HIGH (ref 80.0–100.0)
Monocytes Absolute: 0.2 10*3/uL (ref 0.1–1.0)
Monocytes Relative: 4 %
Neutro Abs: 5.1 10*3/uL (ref 1.7–7.7)
Neutrophils Relative %: 84 %
Platelets: 201 10*3/uL (ref 150–400)
RBC: 1.96 MIL/uL — ABNORMAL LOW (ref 4.22–5.81)
RDW: 15.7 % — ABNORMAL HIGH (ref 11.5–15.5)
WBC: 6.1 10*3/uL (ref 4.0–10.5)
nRBC: 0 % (ref 0.0–0.2)

## 2024-04-21 LAB — COMPREHENSIVE METABOLIC PANEL WITH GFR
ALT: 9 U/L (ref 0–44)
AST: 14 U/L — ABNORMAL LOW (ref 15–41)
Albumin: 3.1 g/dL — ABNORMAL LOW (ref 3.5–5.0)
Alkaline Phosphatase: 65 U/L (ref 38–126)
Anion gap: 10 (ref 5–15)
BUN: 24 mg/dL — ABNORMAL HIGH (ref 8–23)
CO2: 25 mmol/L (ref 22–32)
Calcium: 8.5 mg/dL — ABNORMAL LOW (ref 8.9–10.3)
Chloride: 100 mmol/L (ref 98–111)
Creatinine, Ser: 1.21 mg/dL (ref 0.61–1.24)
GFR, Estimated: >60 mL/min (ref >60)
Glucose, Bld: 120 mg/dL — ABNORMAL HIGH (ref 70–99)
Potassium: 4.2 mmol/L (ref 3.5–5.1)
Sodium: 135 mmol/L (ref 135–145)
Total Bilirubin: 0.3 mg/dL (ref 0.0–1.2)
Total Protein: 6.9 g/dL (ref 6.5–8.1)

## 2024-04-21 LAB — CBC
HCT: 23.6 % — ABNORMAL LOW (ref 39.0–52.0)
Hemoglobin: 7.1 g/dL — ABNORMAL LOW (ref 13.0–17.0)
MCH: 34 pg (ref 26.0–34.0)
MCHC: 30.1 g/dL (ref 30.0–36.0)
MCV: 112.9 fL — ABNORMAL HIGH (ref 80.0–100.0)
Platelets: 185 10*3/uL (ref 150–400)
RBC: 2.09 MIL/uL — ABNORMAL LOW (ref 4.22–5.81)
RDW: 17.3 % — ABNORMAL HIGH (ref 11.5–15.5)
WBC: 4.9 10*3/uL (ref 4.0–10.5)
nRBC: 0 % (ref 0.0–0.2)

## 2024-04-21 LAB — URINALYSIS, W/ REFLEX TO CULTURE (INFECTION SUSPECTED)
Bacteria, UA: NONE SEEN
Bilirubin Urine: NEGATIVE
Glucose, UA: 50 mg/dL — AB
Hgb urine dipstick: NEGATIVE
Ketones, ur: NEGATIVE mg/dL
Leukocytes,Ua: NEGATIVE
Nitrite: NEGATIVE
Protein, ur: NEGATIVE mg/dL
Specific Gravity, Urine: 1.019 (ref 1.005–1.030)
pH: 5 (ref 5.0–8.0)

## 2024-04-21 LAB — VITAMIN B12: Vitamin B-12: 162 pg/mL — ABNORMAL LOW (ref 180–914)

## 2024-04-21 LAB — IRON AND TIBC
Iron: 48 ug/dL (ref 45–182)
Saturation Ratios: 13 % — ABNORMAL LOW (ref 17.9–39.5)
TIBC: 357 ug/dL (ref 250–450)
UIBC: 309 ug/dL

## 2024-04-21 LAB — PROTIME-INR
INR: 1.2 (ref 0.8–1.2)
Prothrombin Time: 15.5 s — ABNORMAL HIGH (ref 11.4–15.2)

## 2024-04-21 LAB — GLUCOSE, CAPILLARY
Glucose-Capillary: 147 mg/dL — ABNORMAL HIGH (ref 70–99)
Glucose-Capillary: 116 mg/dL — ABNORMAL HIGH (ref 70–99)
Glucose-Capillary: 182 mg/dL — ABNORMAL HIGH (ref 70–99)

## 2024-04-21 LAB — I-STAT CG4 LACTIC ACID, ED: Lactic Acid, Venous: 1.4 mmol/L (ref 0.5–1.9)

## 2024-04-21 LAB — PREPARE RBC (CROSSMATCH)

## 2024-04-21 LAB — POC OCCULT BLOOD, ED: Fecal Occult Bld: POSITIVE — AB

## 2024-04-21 LAB — CBG MONITORING, ED: Glucose-Capillary: 130 mg/dL — ABNORMAL HIGH (ref 70–99)

## 2024-04-21 LAB — FOLATE: Folate: 20.2 ng/mL (ref >5.9)

## 2024-04-21 LAB — FERRITIN: Ferritin: 17 ng/mL — ABNORMAL LOW (ref 24–336)

## 2024-04-21 MED ORDER — ACETAMINOPHEN 325 MG PO TABS: 650.0000 mg | ORAL_TABLET | Freq: Four times a day (QID) | ORAL | Status: DC | PRN

## 2024-04-21 MED ORDER — INSULIN ASPART 100 UNIT/ML IJ SOLN
0.0000 [IU] | INTRAMUSCULAR | Status: DC
  Administered 2024-04-21: 2 [IU] via SUBCUTANEOUS
  Administered 2024-04-21 – 2024-04-22 (×2): 3 [IU] via SUBCUTANEOUS
  Filled 2024-04-21: qty 0.15

## 2024-04-21 MED ORDER — PANTOPRAZOLE SODIUM 40 MG IV SOLR
40.0000 mg | INTRAVENOUS | Status: AC
  Administered 2024-04-21 (×2): 40 mg via INTRAVENOUS
  Filled 2024-04-21: qty 10

## 2024-04-21 MED ORDER — PANTOPRAZOLE SODIUM 40 MG IV SOLR
40.0000 mg | Freq: Two times a day (BID) | INTRAVENOUS | Status: DC
  Administered 2024-04-21: 40 mg via INTRAVENOUS
  Filled 2024-04-21 (×3): qty 10

## 2024-04-21 MED ORDER — HYDROCERIN EX CREA
TOPICAL_CREAM | Freq: Two times a day (BID) | CUTANEOUS | Status: DC
  Filled 2024-04-21 (×2): qty 113

## 2024-04-21 MED ORDER — SODIUM CHLORIDE 0.9% IV SOLUTION: Freq: Once | INTRAVENOUS | Status: DC

## 2024-04-21 MED ORDER — MUPIROCIN 2 % EX OINT: TOPICAL_OINTMENT | Freq: Two times a day (BID) | CUTANEOUS | Status: DC

## 2024-04-21 MED ORDER — ALBUTEROL SULFATE (2.5 MG/3ML) 0.083% IN NEBU: 2.5000 mg | INHALATION_SOLUTION | RESPIRATORY_TRACT | Status: DC | PRN

## 2024-04-21 MED ORDER — ACETAMINOPHEN 650 MG RE SUPP: 650.0000 mg | Freq: Four times a day (QID) | RECTAL | Status: DC | PRN

## 2024-04-21 MED ORDER — SODIUM CHLORIDE 0.9 % IV SOLN: INTRAVENOUS | Status: DC

## 2024-04-21 MED ORDER — ONDANSETRON HCL 4 MG/2ML IJ SOLN: 4.0000 mg | Freq: Four times a day (QID) | INTRAMUSCULAR | Status: DC | PRN

## 2024-04-21 MED ORDER — ONDANSETRON HCL 4 MG PO TABS: 4.0000 mg | ORAL_TABLET | Freq: Four times a day (QID) | ORAL | Status: DC | PRN

## 2024-04-21 MED ORDER — MUPIROCIN 2 % EX OINT
TOPICAL_OINTMENT | Freq: Two times a day (BID) | CUTANEOUS | Status: DC
  Filled 2024-04-21: qty 22

## 2024-04-21 NOTE — Consult Note (Signed)
 WOC Nurse Consult Note: Reason for Consult: R lower leg wound  Wound type: full thickness r/t trauma per patient  Pressure Injury POA: NA not related to pressure  Measurement: see nursing flowsheet  Wound bed: red and moist  Drainage (amount, consistency, odor) see nursing flowsheet, no foul smelling or purulent drainage per MD note  Periwound: hyperkeratotic skin and changes consistent with venous stasis  Dressing procedure/placement/frequency: Cleanse R lower leg wound with NS, apply Mupirocin  ointment 2 times daily to wound bed, cover with Telfa nonstick dressing and secure with silicone foam or dry gauze and Kerlix roll gauze anchored around foot whichever is preferred.   Will write for Eucerin to hyperkeratotic skin of R leg as well.  L BKA per history   POC discussed with bedside nurse. WOC team will not follow. Re-consult if further needs arise.   Thank you,    Ronni Colace MSN, RN-BC, [employer] [phone]

## 2024-04-21 NOTE — H&P (View-Only) (Signed)
 Consultation  Referring Provider:     [facility] Primary Care Physician:  Vevelyn Gowers, NP Primary Gastroenterologist:        Legrand Puma, MD Reason for Consultation:     Upper GI bleed, heme positive melena     Impression / Plan:    Upper GI bleed suspected  Decreased hemoglobin with black stools that are heme positive suspicious for melena.  The patient reports chronic black stools and is on iron.  Mild increase in BUN to creatinine ratio.  Chronic macrocytic anemia.  B12 level 153 in 2023, supplementation not on medication list.  Suspicious for B12 deficiency anemia.  This is a common founder in sorting out whether he has significant acute bleeding particularly with the history of chronic black stools on ferrous sulfate .   Eliquis  anticoagulation for atrial fibrillation  History of T1 N0 M0 cecal cancer resected 2017 status post negative colonoscopy September 2024   ------------------------------------------------------------------------------------------------------------------------------------  Check B12, folate, ferritin iron TIBC  Agree with PPI  Hold Eliquis  as you are  Agree with transfusion he may need more than 1 unit recheck CBC at 2 PM (though he is not terribly far from baseline hemoglobin)  EGD tomorrow, I have reviewed the risks benefits and indications of the procedure.  His last dose of Eliquis  will have been about 36 hours prior which is an acceptable timeframe.   Kenney Peacemaker, MD, [facility] See Tilford Foley on call - gastroenterology for best contact person 04/21/2024 9:45 AM          HPI:   [name] is a 73 y.o. 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  The patient lives in an assisted living facility and had a large right ankle  wound related to lymphedema and tearing of the skin and presented earlier this morning because of that.  His hemoglobin was found to be significantly changed down at 6.9, and stools were black and heme positive per ED medical staff.  The patient says ever since he had surgery in 2017 (cecal cancer) of stools have been black.  He does take iron supplementation.  He has not had an increased frequency of stools and says he feels great.  GI review of systems is notable for a history of chronic constipation which is treated successfully with a MiraLAX  agent daily, but he has no nausea, vomiting, dysphagia, abdominal pain, early satiety, other altered bowel habits.  No history of NSAIDs.  He only takes the medications prescribed at his assisted living facility which are listed below.  No prior history of upper GI bleed.  He has had 2 colonoscopies, one in 2017 when his cecal cancer was diagnosed, and another on 08/29/2023 by Dr. Elvin Hammer, which showed left-sided diverticulosis but was otherwise normal status post right hemicolectomy.     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   Cataract    Constipation, chronic 11/09/2016   Diabetes mellitus without complication (HCC)    History of COVID-19 09/09/2022   History of homelessness 09/09/2022   History of kidney stones 09/09/2022   History of Stercoral ulcer of rectum 11/28/2016    Hyperlipidemia    Hypertension    Lichen planus    bilateral legs   Nausea & vomiting 09/09/2022   Obesity (BMI 30-39.9) 09/09/2022   Personal history of cecal colon cancer 09/09/2022    Past Surgical History:  Procedure Laterality Date   AMPUTATION Left 11/03/2022   Procedure: BELOW KNEE  AMPUTATION, LEFT;  Surgeon: Timothy Ford, MD;  Location: [facility] OR;  Service: Orthopedics;  Laterality: Left;   APPENDECTOMY     APPLICATION OF WOUND VAC Left 11/03/2022   Procedure: APPLICATION OF WOUND VAC;  Surgeon: Timothy Ford, MD;  Location: [facility] OR;  Service: Orthopedics;  Laterality: Left;   CHOLECYSTECTOMY N/A 09/11/2022   Procedure: LAPAROSCOPIC CHOLECYSTECTOMY;  Surgeon: Oza Blumenthal, MD;  Location: [facility] ORS;  Service: General;  Laterality: N/A;   COLON RESECTION N/A 11/22/2016   Procedure: HAND ASSISTED LAPAROSCOPIC COLON RESECTION;  Surgeon: Adalberto Hollow, MD;  Location: [facility];  Service: General;  Laterality: N/A;   COLONOSCOPY N/A 11/17/2016   Procedure: COLONOSCOPY;  Surgeon: Asencion Blacksmith, MD;  Location: [facility] ENDOSCOPY;  Service: Endoscopy;  Laterality: N/A;   COLONOSCOPY WITH PROPOFOL  N/A 08/29/2023   Procedure: COLONOSCOPY WITH PROPOFOL ;  Surgeon: Tobin Forts, MD;  Location: [facility] ENDOSCOPY;  Service: Gastroenterology;  Laterality: N/A;   FRACTURE SURGERY     MULTIPLE TOOTH EXTRACTIONS     RECTAL EXAM UNDER ANESTHESIA N/A 12/05/2016   Procedure: RECTAL EXAM UNDER ANESTHESIA, DISIMPACTION;  Surgeon: Juanita Norlander, MD;  Location: [facility] ORS;  Service: General;  Laterality: N/A;    Family History  Problem Relation Age of Onset   Brain cancer Mother    Alzheimer's disease Father    Cancer Maternal Grandmother    Heart attack Maternal Grandfather    Pneumonia Paternal Grandfather    Colon cancer Neg Hx    Esophageal cancer Neg Hx    Stomach cancer Neg Hx      Social History   Tobacco Use   Smoking status: Former    Types: Pipe    Quit date: 09/14/1983    Years since quitting:  40.6   Smokeless tobacco: Never  Vaping Use   Vaping status: Never Used  Substance Use Topics   Alcohol  use: No   Drug use: No    Prior to Admission medications   Medication Sig Start Date End Date Taking? Authorizing Provider  acetaminophen  (TYLENOL ) 325 MG tablet Take 650 mg by mouth every 4 (four) hours as needed for moderate pain (pain score 4-6), fever or mild pain (pain score 1-3).    [provider]  apixaban  (ELIQUIS ) 5 MG TABS tablet Take 1 tablet (5 mg total) by mouth 2 (two) times daily. 02/02/22   Walker, Caitlin S, NP  atorvastatin  (LIPITOR) 20 MG tablet Take 20 mg by mouth every evening. 02/02/21   [provider]  benzonatate (TESSALON) 100 MG capsule Take 100  mg by mouth 3 (three) times daily as needed for cough.    [provider]  docusate sodium  (COLACE) 100 MG capsule Take 1 capsule (100 mg total) by mouth 2 (two) times daily. 12/08/16   Justina Oman, MD  ferrous sulfate  (FEROSUL) 325 (65 FE) MG tablet Take 325 mg by mouth daily.    [provider]  fluocinolone 0.01 % cream Apply 1 Application topically See admin instructions. Apply a thin layer to lower right leg 2 times a day 05/04/23   [provider]  furosemide  (LASIX ) 80 MG tablet Take 80 mg by mouth daily. 08/26/22   [provider]  glipiZIDE  (GLUCOTROL  XL) 10 MG 24 hr tablet Take 10 mg by mouth daily.    [provider]  Glucagon (GVOKE HYPOPEN 1-PACK) 1 MG/0.2ML SOAJ Inject 1 mg into the skin as needed (for emergency of severe hypoglycemia).    [provider]  hydrOXYzine  (ATARAX /VISTARIL ) 25 MG tablet Take 1 tablet (25 mg total) by mouth every 8 (eight) hours as needed for itching. Use caution as this medication may make you drowsy Patient taking differently: Take 25 mg by mouth every 8 (eight) hours as needed for itching (per MAR). 07/17/20   Little, Hebert Littler, MD  metFORMIN  (GLUCOPHAGE -XR) 500 MG 24 hr tablet Take 1,000 mg by mouth in the  morning and at bedtime.    [provider]  NYAMYC  powder Apply 1 Application topically See admin instructions. Apply to affected areas 2 times a day 06/08/23   [provider]  polyethylene glycol (MIRALAX ) packet Take 17 g by mouth daily. Hold for diarrhea Patient taking differently: Take 17 g by mouth See admin instructions. Mix 17 grams into 8 ounces of water, coffee, tea, or juice and drink by mouth once a day 12/08/16   Justina Oman, MD  potassium chloride  SA (KLOR-CON  M) 20 MEQ tablet Take 20 mEq by mouth daily. 08/26/22   [provider]  tamsulosin  (FLOMAX ) 0.4 MG CAPS capsule Take 1 capsule (0.4 mg total) by mouth daily. Patient taking differently: Take 0.4 mg by mouth every evening. 11/27/16   Justina Oman, MD  traZODone  (DESYREL ) 100 MG tablet Take 100 mg by mouth at bedtime.    [provider]    Current Facility-Administered Medications  Medication Dose Route Frequency Provider Last Rate Last Admin   0.9 %  sodium chloride  infusion (Manually program via Guardrails IV Fluids)   Intravenous Once Robinson, John K, PA-C       acetaminophen  (TYLENOL ) tablet 650 mg  650 mg Oral Q6H PRN Jannette Mend, Mir M, MD       Or   acetaminophen  (TYLENOL ) suppository 650 mg  650 mg Rectal Q6H PRN Jannette Mend, Mir M, MD       albuterol  (PROVENTIL ) (2.5 MG/3ML) 0.083% nebulizer solution 2.5 mg  2.5 mg Nebulization Q2H PRN Jannette Mend, Mir M, MD       insulin  aspart (novoLOG ) injection 0-15 Units  0-15 Units Subcutaneous Q4H Jannette Mend, Mir M, MD       ondansetron  (ZOFRAN ) tablet 4 mg  4 mg Oral Q6H PRN Jannette Mend, Mir M, MD       Or   ondansetron  (ZOFRAN ) injection 4 mg  4 mg Intravenous Q6H PRN Jannette Mend, Mir M, MD       pantoprazole  (PROTONIX ) injection 40 mg  40 mg Intravenous Q12H Robinson, John K, PA-C       Current Outpatient Medications  Medication Sig Dispense Refill   acetaminophen  (TYLENOL ) 325  MG tablet Take 650 mg by mouth every 4 (four) hours as  needed for moderate pain (pain score 4-6), fever or mild pain (pain score 1-3).     apixaban  (ELIQUIS ) 5 MG TABS tablet Take 1 tablet (5 mg total) by mouth 2 (two) times daily. 60 tablet 5   atorvastatin  (LIPITOR) 20 MG tablet Take 20 mg by mouth every evening.     benzonatate (TESSALON) 100 MG capsule Take 100 mg by mouth 3 (three) times daily as needed for cough.     docusate sodium  (COLACE) 100 MG capsule Take 1 capsule (100 mg total) by mouth 2 (two) times daily.     ferrous sulfate  (FEROSUL) 325 (65 FE) MG tablet Take 325 mg by mouth daily.     fluocinolone 0.01 % cream Apply 1 Application topically See admin instructions. Apply a thin layer to lower right leg 2 times a day     furosemide  (LASIX ) 80 MG tablet Take 80 mg by mouth daily.     glipiZIDE  (GLUCOTROL  XL) 10 MG 24 hr tablet Take 10 mg by mouth daily.     Glucagon (GVOKE HYPOPEN 1-PACK) 1 MG/0.2ML SOAJ Inject 1 mg into the skin as needed (for emergency of severe hypoglycemia).     hydrOXYzine  (ATARAX /VISTARIL ) 25 MG tablet Take 1 tablet (25 mg total) by mouth every 8 (eight) hours as needed for itching. Use caution as this medication may make you drowsy (Patient taking differently: Take 25 mg by mouth every 8 (eight) hours as needed for itching (per MAR).) 10 tablet 0   metFORMIN  (GLUCOPHAGE -XR) 500 MG 24 hr tablet Take 1,000 mg by mouth in the morning and at bedtime.     NYAMYC  powder Apply 1 Application topically See admin instructions. Apply to affected areas 2 times a day     polyethylene glycol (MIRALAX ) packet Take 17 g by mouth daily. Hold for diarrhea (Patient taking differently: Take 17 g by mouth See admin instructions. Mix 17 grams into 8 ounces of water, coffee, tea, or juice and drink by mouth once a day)     potassium chloride  SA (KLOR-CON  M) 20 MEQ tablet Take 20 mEq by mouth daily.     tamsulosin  (FLOMAX ) 0.4 MG CAPS capsule Take 1 capsule (0.4 mg total) by mouth daily. (Patient taking differently: Take 0.4 mg by mouth  every evening.)     traZODone  (DESYREL ) 100 MG tablet Take 100 mg by mouth at bedtime.      Allergies as of 04/21/2024   (No Known Allergies)     Review of Systems:    This is positive for those things mentioned in the HPI All other review of systems are negative.       Physical Exam:  Vital signs in last 24 hours: Temp:  [98.2 F (36.8 C)-98.3 F (36.8 C)] 98.2 F (36.8 C) (05/10 0819) Pulse Rate:  [71] 71 (05/10 0417) Resp:  [19] 19 (05/10 0417) BP: (143)/(53) 143/53 (05/10 0417) SpO2:  [98 %] 98 % (05/10 0417) Weight:  [102.5 kg] 102.5 kg (05/10 0417)    General:  Obese jovial man in no acute distress Eyes:  anicteric.  Conjunctival pallor ENT:   Mouth and posterior pharynx free of lesions.  He is edentulous.  Mucosa pale Lungs: Clear to auscultation bilaterally.-Anterior fields Heart:   S1S2, no rubs, murmurs, gallops.  He is in a regular rhythm Abdomen: Obese, soft, non-tender, no hepatosplenomegaly, hernia, or mass and BS+.  There is an umbilical laparoscopic scar and a  low midline scar. Rectal: Per ED medical staff melenic appearing stool it is heme positive Extremities/skin:   There is a left BKA.  The right lower extremity is edematous, and there is an ulcer of the medial aspect of the ankle and distal leg area, with erythematous subcutaneous tissue exposed.  There is scaliness of the skin in the foot and other areas of the lower extremity and signs of excoriation more proximally.  There are significant onychomycosis changes of the toenails. Neuro:  A&O x 3.  Psych:  appropriate mood and  Affect.   Data Reviewed:   LAB RESULTS: Recent Labs    04/21/24 0435  WBC 6.1  HGB 6.9*  HCT 22.3*  PLT 201   BMET Recent Labs    04/21/24 0435  NA 135  K 4.2  CL 100  CO2 25  GLUCOSE 120*  BUN 24*  CREATININE 1.21  CALCIUM  8.5*   LFT Recent Labs    04/21/24 0435  PROT 6.9  ALBUMIN 3.1*  AST 14*  ALT 9  ALKPHOS 65  BILITOT 0.3   PT/INR (Eliquis  in  effect) Recent Labs    04/21/24 0751  LABPROT 15.5*  INR 1.2      Thanks   LOS: 0 days   @Arn [email or web address]  Tammie Fall, MD, [facility] @  04/21/2024, 9:28 AM

## 2024-04-21 NOTE — Consult Note (Signed)
 Consultation  Referring Provider:     [facility] Primary Care Physician:  Vevelyn Gowers, NP Primary Gastroenterologist:        Legrand Puma, MD Reason for Consultation:     Upper GI bleed, heme positive melena     Impression / Plan:    Upper GI bleed suspected  Decreased hemoglobin with black stools that are heme positive suspicious for melena.  The patient reports chronic black stools and is on iron.  Mild increase in BUN to creatinine ratio.  Chronic macrocytic anemia.  B12 level 153 in 2023, supplementation not on medication list.  Suspicious for B12 deficiency anemia.  This is a common founder in sorting out whether he has significant acute bleeding particularly with the history of chronic black stools on ferrous sulfate .   Eliquis  anticoagulation for atrial fibrillation  History of T1 N0 M0 cecal cancer resected 2017 status post negative colonoscopy September 2024   ------------------------------------------------------------------------------------------------------------------------------------  Check B12, folate, ferritin iron TIBC  Agree with PPI  Hold Eliquis  as you are  Agree with transfusion he may need more than 1 unit recheck CBC at 2 PM (though he is not terribly far from baseline hemoglobin)  EGD tomorrow, I have reviewed the risks benefits and indications of the procedure.  His last dose of Eliquis  will have been about 36 hours prior which is an acceptable timeframe.   Kenney Peacemaker, MD, [facility] See Tilford Foley on call - gastroenterology for best contact person 04/21/2024 9:45 AM          HPI:   [name] is a 73 y.o. 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  The patient lives in an assisted living facility and had a large right ankle  wound related to lymphedema and tearing of the skin and presented earlier this morning because of that.  His hemoglobin was found to be significantly changed down at 6.9, and stools were black and heme positive per ED medical staff.  The patient says ever since he had surgery in 2017 (cecal cancer) of stools have been black.  He does take iron supplementation.  He has not had an increased frequency of stools and says he feels great.  GI review of systems is notable for a history of chronic constipation which is treated successfully with a MiraLAX  agent daily, but he has no nausea, vomiting, dysphagia, abdominal pain, early satiety, other altered bowel habits.  No history of NSAIDs.  He only takes the medications prescribed at his assisted living facility which are listed below.  No prior history of upper GI bleed.  He has had 2 colonoscopies, one in 2017 when his cecal cancer was diagnosed, and another on 08/29/2023 by Dr. Elvin Hammer, which showed left-sided diverticulosis but was otherwise normal status post right hemicolectomy.     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   Cataract    Constipation, chronic 11/09/2016   Diabetes mellitus without complication (HCC)    History of COVID-19 09/09/2022   History of homelessness 09/09/2022   History of kidney stones 09/09/2022   History of Stercoral ulcer of rectum 11/28/2016    Hyperlipidemia    Hypertension    Lichen planus    bilateral legs   Nausea & vomiting 09/09/2022   Obesity (BMI 30-39.9) 09/09/2022   Personal history of cecal colon cancer 09/09/2022    Past Surgical History:  Procedure Laterality Date   AMPUTATION Left 11/03/2022   Procedure: BELOW KNEE  AMPUTATION, LEFT;  Surgeon: Timothy Ford, MD;  Location: [facility] OR;  Service: Orthopedics;  Laterality: Left;   APPENDECTOMY     APPLICATION OF WOUND VAC Left 11/03/2022   Procedure: APPLICATION OF WOUND VAC;  Surgeon: Timothy Ford, MD;  Location: [facility] OR;  Service: Orthopedics;  Laterality: Left;   CHOLECYSTECTOMY N/A 09/11/2022   Procedure: LAPAROSCOPIC CHOLECYSTECTOMY;  Surgeon: Oza Blumenthal, MD;  Location: [facility] ORS;  Service: General;  Laterality: N/A;   COLON RESECTION N/A 11/22/2016   Procedure: HAND ASSISTED LAPAROSCOPIC COLON RESECTION;  Surgeon: Adalberto Hollow, MD;  Location: [facility];  Service: General;  Laterality: N/A;   COLONOSCOPY N/A 11/17/2016   Procedure: COLONOSCOPY;  Surgeon: Asencion Blacksmith, MD;  Location: [facility] ENDOSCOPY;  Service: Endoscopy;  Laterality: N/A;   COLONOSCOPY WITH PROPOFOL  N/A 08/29/2023   Procedure: COLONOSCOPY WITH PROPOFOL ;  Surgeon: Tobin Forts, MD;  Location: [facility] ENDOSCOPY;  Service: Gastroenterology;  Laterality: N/A;   FRACTURE SURGERY     MULTIPLE TOOTH EXTRACTIONS     RECTAL EXAM UNDER ANESTHESIA N/A 12/05/2016   Procedure: RECTAL EXAM UNDER ANESTHESIA, DISIMPACTION;  Surgeon: Juanita Norlander, MD;  Location: [facility] ORS;  Service: General;  Laterality: N/A;    Family History  Problem Relation Age of Onset   Brain cancer Mother    Alzheimer's disease Father    Cancer Maternal Grandmother    Heart attack Maternal Grandfather    Pneumonia Paternal Grandfather    Colon cancer Neg Hx    Esophageal cancer Neg Hx    Stomach cancer Neg Hx      Social History   Tobacco Use   Smoking status: Former    Types: Pipe    Quit date: 09/14/1983    Years since quitting:  40.6   Smokeless tobacco: Never  Vaping Use   Vaping status: Never Used  Substance Use Topics   Alcohol  use: No   Drug use: No    Prior to Admission medications   Medication Sig Start Date End Date Taking? Authorizing Provider  acetaminophen  (TYLENOL ) 325 MG tablet Take 650 mg by mouth every 4 (four) hours as needed for moderate pain (pain score 4-6), fever or mild pain (pain score 1-3).    [provider]  apixaban  (ELIQUIS ) 5 MG TABS tablet Take 1 tablet (5 mg total) by mouth 2 (two) times daily. 02/02/22   Walker, Caitlin S, NP  atorvastatin  (LIPITOR) 20 MG tablet Take 20 mg by mouth every evening. 02/02/21   [provider]  benzonatate (TESSALON) 100 MG capsule Take 100  mg by mouth 3 (three) times daily as needed for cough.    [provider]  docusate sodium  (COLACE) 100 MG capsule Take 1 capsule (100 mg total) by mouth 2 (two) times daily. 12/08/16   Justina Oman, MD  ferrous sulfate  (FEROSUL) 325 (65 FE) MG tablet Take 325 mg by mouth daily.    [provider]  fluocinolone 0.01 % cream Apply 1 Application topically See admin instructions. Apply a thin layer to lower right leg 2 times a day 05/04/23   [provider]  furosemide  (LASIX ) 80 MG tablet Take 80 mg by mouth daily. 08/26/22   [provider]  glipiZIDE  (GLUCOTROL  XL) 10 MG 24 hr tablet Take 10 mg by mouth daily.    [provider]  Glucagon (GVOKE HYPOPEN 1-PACK) 1 MG/0.2ML SOAJ Inject 1 mg into the skin as needed (for emergency of severe hypoglycemia).    [provider]  hydrOXYzine  (ATARAX /VISTARIL ) 25 MG tablet Take 1 tablet (25 mg total) by mouth every 8 (eight) hours as needed for itching. Use caution as this medication may make you drowsy Patient taking differently: Take 25 mg by mouth every 8 (eight) hours as needed for itching (per MAR). 07/17/20   Little, Hebert Littler, MD  metFORMIN  (GLUCOPHAGE -XR) 500 MG 24 hr tablet Take 1,000 mg by mouth in the  morning and at bedtime.    [provider]  NYAMYC  powder Apply 1 Application topically See admin instructions. Apply to affected areas 2 times a day 06/08/23   [provider]  polyethylene glycol (MIRALAX ) packet Take 17 g by mouth daily. Hold for diarrhea Patient taking differently: Take 17 g by mouth See admin instructions. Mix 17 grams into 8 ounces of water, coffee, tea, or juice and drink by mouth once a day 12/08/16   Justina Oman, MD  potassium chloride  SA (KLOR-CON  M) 20 MEQ tablet Take 20 mEq by mouth daily. 08/26/22   [provider]  tamsulosin  (FLOMAX ) 0.4 MG CAPS capsule Take 1 capsule (0.4 mg total) by mouth daily. Patient taking differently: Take 0.4 mg by mouth every evening. 11/27/16   Justina Oman, MD  traZODone  (DESYREL ) 100 MG tablet Take 100 mg by mouth at bedtime.    [provider]    Current Facility-Administered Medications  Medication Dose Route Frequency Provider Last Rate Last Admin   0.9 %  sodium chloride  infusion (Manually program via Guardrails IV Fluids)   Intravenous Once Robinson, John K, PA-C       acetaminophen  (TYLENOL ) tablet 650 mg  650 mg Oral Q6H PRN Jannette Mend, Mir M, MD       Or   acetaminophen  (TYLENOL ) suppository 650 mg  650 mg Rectal Q6H PRN Jannette Mend, Mir M, MD       albuterol  (PROVENTIL ) (2.5 MG/3ML) 0.083% nebulizer solution 2.5 mg  2.5 mg Nebulization Q2H PRN Jannette Mend, Mir M, MD       insulin  aspart (novoLOG ) injection 0-15 Units  0-15 Units Subcutaneous Q4H Jannette Mend, Mir M, MD       ondansetron  (ZOFRAN ) tablet 4 mg  4 mg Oral Q6H PRN Jannette Mend, Mir M, MD       Or   ondansetron  (ZOFRAN ) injection 4 mg  4 mg Intravenous Q6H PRN Jannette Mend, Mir M, MD       pantoprazole  (PROTONIX ) injection 40 mg  40 mg Intravenous Q12H Robinson, John K, PA-C       Current Outpatient Medications  Medication Sig Dispense Refill   acetaminophen  (TYLENOL ) 325  MG tablet Take 650 mg by mouth every 4 (four) hours as  needed for moderate pain (pain score 4-6), fever or mild pain (pain score 1-3).     apixaban  (ELIQUIS ) 5 MG TABS tablet Take 1 tablet (5 mg total) by mouth 2 (two) times daily. 60 tablet 5   atorvastatin  (LIPITOR) 20 MG tablet Take 20 mg by mouth every evening.     benzonatate (TESSALON) 100 MG capsule Take 100 mg by mouth 3 (three) times daily as needed for cough.     docusate sodium  (COLACE) 100 MG capsule Take 1 capsule (100 mg total) by mouth 2 (two) times daily.     ferrous sulfate  (FEROSUL) 325 (65 FE) MG tablet Take 325 mg by mouth daily.     fluocinolone 0.01 % cream Apply 1 Application topically See admin instructions. Apply a thin layer to lower right leg 2 times a day     furosemide  (LASIX ) 80 MG tablet Take 80 mg by mouth daily.     glipiZIDE  (GLUCOTROL  XL) 10 MG 24 hr tablet Take 10 mg by mouth daily.     Glucagon (GVOKE HYPOPEN 1-PACK) 1 MG/0.2ML SOAJ Inject 1 mg into the skin as needed (for emergency of severe hypoglycemia).     hydrOXYzine  (ATARAX /VISTARIL ) 25 MG tablet Take 1 tablet (25 mg total) by mouth every 8 (eight) hours as needed for itching. Use caution as this medication may make you drowsy (Patient taking differently: Take 25 mg by mouth every 8 (eight) hours as needed for itching (per MAR).) 10 tablet 0   metFORMIN  (GLUCOPHAGE -XR) 500 MG 24 hr tablet Take 1,000 mg by mouth in the morning and at bedtime.     NYAMYC  powder Apply 1 Application topically See admin instructions. Apply to affected areas 2 times a day     polyethylene glycol (MIRALAX ) packet Take 17 g by mouth daily. Hold for diarrhea (Patient taking differently: Take 17 g by mouth See admin instructions. Mix 17 grams into 8 ounces of water, coffee, tea, or juice and drink by mouth once a day)     potassium chloride  SA (KLOR-CON  M) 20 MEQ tablet Take 20 mEq by mouth daily.     tamsulosin  (FLOMAX ) 0.4 MG CAPS capsule Take 1 capsule (0.4 mg total) by mouth daily. (Patient taking differently: Take 0.4 mg by mouth  every evening.)     traZODone  (DESYREL ) 100 MG tablet Take 100 mg by mouth at bedtime.      Allergies as of 04/21/2024   (No Known Allergies)     Review of Systems:    This is positive for those things mentioned in the HPI All other review of systems are negative.       Physical Exam:  Vital signs in last 24 hours: Temp:  [98.2 F (36.8 C)-98.3 F (36.8 C)] 98.2 F (36.8 C) (05/10 0819) Pulse Rate:  [71] 71 (05/10 0417) Resp:  [19] 19 (05/10 0417) BP: (143)/(53) 143/53 (05/10 0417) SpO2:  [98 %] 98 % (05/10 0417) Weight:  [102.5 kg] 102.5 kg (05/10 0417)    General:  Obese jovial man in no acute distress Eyes:  anicteric.  Conjunctival pallor ENT:   Mouth and posterior pharynx free of lesions.  He is edentulous.  Mucosa pale Lungs: Clear to auscultation bilaterally.-Anterior fields Heart:   S1S2, no rubs, murmurs, gallops.  He is in a regular rhythm Abdomen: Obese, soft, non-tender, no hepatosplenomegaly, hernia, or mass and BS+.  There is an umbilical laparoscopic scar and a  low midline scar. Rectal: Per ED medical staff melenic appearing stool it is heme positive Extremities/skin:   There is a left BKA.  The right lower extremity is edematous, and there is an ulcer of the medial aspect of the ankle and distal leg area, with erythematous subcutaneous tissue exposed.  There is scaliness of the skin in the foot and other areas of the lower extremity and signs of excoriation more proximally.  There are significant onychomycosis changes of the toenails. Neuro:  A&O x 3.  Psych:  appropriate mood and  Affect.   Data Reviewed:   LAB RESULTS: Recent Labs    04/21/24 0435  WBC 6.1  HGB 6.9*  HCT 22.3*  PLT 201   BMET Recent Labs    04/21/24 0435  NA 135  K 4.2  CL 100  CO2 25  GLUCOSE 120*  BUN 24*  CREATININE 1.21  CALCIUM  8.5*   LFT Recent Labs    04/21/24 0435  PROT 6.9  ALBUMIN 3.1*  AST 14*  ALT 9  ALKPHOS 65  BILITOT 0.3   PT/INR (Eliquis  in  effect) Recent Labs    04/21/24 0751  LABPROT 15.5*  INR 1.2      Thanks   LOS: 0 days   @Arn [email or web address]  Tammie Fall, MD, [facility] @  04/21/2024, 9:28 AM

## 2024-04-21 NOTE — Anesthesia Preprocedure Evaluation (Signed)
 Anesthesia Evaluation  Patient identified by MRN, date of birth, ID band Patient awake    Reviewed: Allergy & Precautions, NPO status , Patient's Chart, lab work & pertinent test results  Airway Mallampati: III  TM Distance: >3 FB Neck ROM: Full    Dental  (+) Edentulous Upper, Edentulous Lower, Dental Advisory Given   Pulmonary former smoker   Pulmonary exam normal breath sounds clear to auscultation       Cardiovascular hypertension, Pt. on medications +CHF  + dysrhythmias Atrial Fibrillation  Rhythm:Regular Rate:Tachycardia  Echo 03/2024  1. Technically difficult study   2. Left ventricular ejection fraction, by estimation, is 60 to 65%. The left ventricle has grossly normal function. Left ventricular endocardial border not optimally defined to evaluate regional wall motion. Left ventricular diastolic parameters are indeterminate.   3. Right ventricular systolic function was not well visualized. The right ventricular size is not well visualized. Tricuspid regurgitation signal is inadequate for assessing PA pressure.   4. The mitral valve is grossly normal. No evidence of mitral valve regurgitation. No evidence of mitral stenosis.   5. The aortic valve was not well visualized. Aortic valve regurgitation is not visualized. No aortic stenosis is present.   6. The inferior vena cava is dilated in size with <50% respiratory variability, suggesting right atrial pressure of 15 mmHg.   7. Poor visualization of aorta, unable to evaluate aortic aneurysm    CT angio 01/2024 1. 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 Recommend semi-annual imaging followup by CTA or MRA and referral to cardiothoracic surgery if not already obtained. This recommendation follows 2010 ACCF/AHA/AATS/ACR/ASA/SCA/SCAI/SIR/STS/SVM Guidelines for the Diagnosis and Management of Patients With Thoracic Aortic Disease.  Circulation. 2010; 121: [id number]. Aortic aneurysm NOS (ICD10-I71.9) 2. Moderate multi-vessel coronary artery calcification. 3. Mildly dilated central pulmonary arteries suggesting changes ofpulmonary arterial hypertension. 4. Mild to moderate atrophy of the visualized kidneys.   Echo 2.2024  1. Left ventricular ejection fraction, by estimation, is 55 to 60%. The left ventricle has normal function. Left ventricular endocardial border not optimally defined to evaluate regional wall motion. Left ventricular diastolic function could not be evaluated.   2. Right ventricular systolic function is normal. The right ventricular size is normal. Tricuspid regurgitation signal is inadequate for assessing PA pressure.   3. Left atrial size was mildly dilated.   4. The mitral valve is grossly normal. Trivial mitral valve regurgitation. No evidence of mitral stenosis.   5. The aortic valve is tricuspid. Aortic valve regurgitation is not visualized. No aortic stenosis is present.   6. Aortic dilatation noted. Aneurysm of the aortic root, measuring 45 mm.   7. The inferior vena cava is normal in size with greater than 50% respiratory variability, suggesting right atrial pressure of 3 mmHg.   Comparison(s): No significant change from prior study.     Neuro/Psych negative neurological ROS  negative psych ROS   GI/Hepatic Neg liver ROS, PUD,GERD  Medicated,,  Endo/Other  diabetes, Type 2, Oral Hypoglycemic Agents    Renal/GU Renal InsufficiencyRenal disease     Musculoskeletal  (+) Arthritis ,    Abdominal  (+) + obese  Peds  Hematology  (+) Blood dyscrasia, anemia   Anesthesia Other Findings   Reproductive/Obstetrics                             Anesthesia Physical Anesthesia Plan  ASA: 4  Anesthesia  Plan:    Post-op Pain Management: Minimal or no pain anticipated   Induction: Intravenous  PONV Risk Score and Plan: 1 and Ondansetron , Propofol  infusion,  Treatment may vary due to age or medical condition and TIVA  Airway Management Planned:   Additional Equipment: None  Intra-op Plan:   Post-operative Plan:   Informed Consent: I have reviewed the patients History and Physical, chart, labs and discussed the procedure including the risks, benefits and alternatives for the proposed anesthesia with the patient or authorized representative who has indicated his/her understanding and acceptance.     Dental advisory given  Plan Discussed with: CRNA  Anesthesia Plan Comments:        Anesthesia Quick Evaluation

## 2024-04-21 NOTE — H&P (Addendum)
 History and Physical  ALANSON DUMONT HYQ:[id number] DOB: 1951-06-21 DOA: 04/21/2024  PCP: Vevelyn Gowers, NP   Chief Complaint: Right ankle wound  HPI: [name] is a 73 y.o. male with medical history significant for diabetes, hypertension, hyperlipidemia, atrial fibrillation on Eliquis  being admitted to the hospital with blood loss anemia.  He has a skin tear to his right ankle, which is why he came to the ER for evaluation.  While here, blood work was done because the patient was noticed to be looking pale, and was noted to have acute anemia, with rectal exam positive for occult blood.  States he has been feeling fine, denies any chest pain, dizziness, nausea, vomiting, weakness and does not think that he looks pale.  He typically does not ambulate, moves around in a wheelchair.  In any case, ER provider has ordered PRBC to be transfused, and discussed with Dr. Willy Harvest of gastroenterology who will see the patient in consultation.  Review of Systems: Please see HPI for pertinent positives and negatives. A complete 10 system review of systems are otherwise negative.  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   Cataract    Constipation, chronic 11/09/2016   Diabetes mellitus without complication (HCC)    History of COVID-19 09/09/2022   History of homelessness 09/09/2022   History of kidney stones 09/09/2022   History of Stercoral ulcer of rectum 11/28/2016   Hyperlipidemia    Hypertension    Lichen planus    bilateral legs   Nausea & vomiting 09/09/2022   Obesity (BMI 30-39.9) 09/09/2022   Personal history of cecal colon cancer 09/09/2022   Past Surgical History:  Procedure Laterality Date   AMPUTATION Left 11/03/2022   Procedure: BELOW KNEE  AMPUTATION, LEFT;  Surgeon: Timothy Ford, MD;  Location: [facility] OR;  Service: Orthopedics;  Laterality: Left;    APPENDECTOMY     APPLICATION OF WOUND VAC Left 11/03/2022   Procedure: APPLICATION OF WOUND VAC;  Surgeon: Timothy Ford, MD;  Location: [facility] OR;  Service: Orthopedics;  Laterality: Left;   CHOLECYSTECTOMY N/A 09/11/2022   Procedure: LAPAROSCOPIC CHOLECYSTECTOMY;  Surgeon: Oza Blumenthal, MD;  Location: [facility] ORS;  Service: General;  Laterality: N/A;   COLON RESECTION N/A 11/22/2016   Procedure: HAND ASSISTED LAPAROSCOPIC COLON RESECTION;  Surgeon: Adalberto Hollow, MD;  Location: [facility];  Service: General;  Laterality: N/A;   COLONOSCOPY N/A 11/17/2016   Procedure: COLONOSCOPY;  Surgeon: Asencion Blacksmith, MD;  Location: [facility] ENDOSCOPY;  Service: Endoscopy;  Laterality: N/A;   COLONOSCOPY WITH PROPOFOL  N/A 08/29/2023   Procedure: COLONOSCOPY WITH PROPOFOL ;  Surgeon: Tobin Forts, MD;  Location: [facility] ENDOSCOPY;  Service: Gastroenterology;  Laterality: N/A;   FRACTURE SURGERY     MULTIPLE TOOTH EXTRACTIONS     RECTAL EXAM UNDER ANESTHESIA N/A 12/05/2016   Procedure: RECTAL EXAM UNDER ANESTHESIA, DISIMPACTION;  Surgeon: Juanita Norlander, MD;  Location: [facility] ORS;  Service: General;  Laterality: N/A;   Social History:  reports that he quit smoking about 40 years ago. His smoking use included pipe. He has never used smokeless tobacco. He reports that he does not drink alcohol  and does not use drugs.  No Known Allergies  Family History  Problem Relation Age of Onset   Brain cancer Mother    Alzheimer's disease Father    Cancer Maternal Grandmother    Heart attack Maternal  Grandfather    Pneumonia Paternal Grandfather    Colon cancer Neg Hx    Esophageal cancer Neg Hx    Stomach cancer Neg Hx      Prior to Admission medications   Medication Sig Start Date End Date Taking? Authorizing Provider  acetaminophen  (TYLENOL ) 325 MG tablet Take 650 mg by mouth every 4 (four) hours as needed for moderate pain (pain score 4-6), fever or mild pain (pain score 1-3).    [provider]  apixaban  (ELIQUIS ) 5  MG TABS tablet Take 1 tablet (5 mg total) by mouth 2 (two) times daily. 02/02/22   Walker, Caitlin S, NP  atorvastatin  (LIPITOR) 20 MG tablet Take 20 mg by mouth every evening. 02/02/21   [provider]  benzonatate (TESSALON) 100 MG capsule Take 100 mg by mouth 3 (three) times daily as needed for cough.    [provider]  docusate sodium  (COLACE) 100 MG capsule Take 1 capsule (100 mg total) by mouth 2 (two) times daily. 12/08/16   Justina Oman, MD  ferrous sulfate  (FEROSUL) 325 (65 FE) MG tablet Take 325 mg by mouth daily.    [provider]  fluocinolone 0.01 % cream Apply 1 Application topically See admin instructions. Apply a thin layer to lower right leg 2 times a day 05/04/23   [provider]  furosemide  (LASIX ) 80 MG tablet Take 80 mg by mouth daily. 08/26/22   [provider]  glipiZIDE  (GLUCOTROL  XL) 10 MG 24 hr tablet Take 10 mg by mouth daily.    [provider]  Glucagon (GVOKE HYPOPEN 1-PACK) 1 MG/0.2ML SOAJ Inject 1 mg into the skin as needed (for emergency of severe hypoglycemia).    [provider]  hydrOXYzine  (ATARAX /VISTARIL ) 25 MG tablet Take 1 tablet (25 mg total) by mouth every 8 (eight) hours as needed for itching. Use caution as this medication may make you drowsy Patient taking differently: Take 25 mg by mouth every 8 (eight) hours as needed for itching (per MAR). 07/17/20   Little, Hebert Littler, MD  metFORMIN  (GLUCOPHAGE -XR) 500 MG 24 hr tablet Take 1,000 mg by mouth in the morning and at bedtime.    [provider]  NYAMYC  powder Apply 1 Application topically See admin instructions. Apply to affected areas 2 times a day 06/08/23   [provider]  polyethylene glycol (MIRALAX ) packet Take 17 g by mouth daily. Hold for diarrhea Patient taking differently: Take 17 g by mouth See admin instructions. Mix 17 grams into 8 ounces of water, coffee, tea, or juice and drink by mouth once a day 12/08/16    Justina Oman, MD  potassium chloride  SA (KLOR-CON  M) 20 MEQ tablet Take 20 mEq by mouth daily. 08/26/22   [provider]  tamsulosin  (FLOMAX ) 0.4 MG CAPS capsule Take 1 capsule (0.4 mg total) by mouth daily. Patient taking differently: Take 0.4 mg by mouth every evening. 11/27/16   Justina Oman, MD  traZODone  (DESYREL ) 100 MG tablet Take 100 mg by mouth at bedtime.    [provider]    Physical Exam: BP (!) 143/53   Pulse 71   Temp 98.3 F (36.8 C) (Oral)   Resp 19   Ht 6\' 3"  (1.905 m)   Wt 102.5 kg   SpO2 98%   BMI 28.25 kg/m  General:  Alert, oriented, calm, in no acute distress, pleasant and cooperative.  He looks pale. Cardiovascular: RRR, no murmurs or rubs, no peripheral edema  Respiratory: clear to  auscultation bilaterally, no wheezes, no crackles  Abdomen: soft, nontender, nondistended, normal bowel tones heard  Skin: dry, no rashes  Musculoskeletal: no joint effusions, normal range of motion, he has chronic right lower extremity venous stasis changes with lymphedema.  He is status post left BKA.  There is a skin tear just above the right ankle, without any complicating features. Psychiatric: appropriate affect, normal speech  Neurologic: extraocular muscles intact, clear speech, moving all extremities with intact sensorium         Labs on Admission:  Basic Metabolic Panel: Recent Labs  Lab 04/21/24 0435  NA 135  K 4.2  CL 100  CO2 25  GLUCOSE 120*  BUN 24*  CREATININE 1.21  CALCIUM  8.5*   Liver Function Tests: Recent Labs  Lab 04/21/24 0435  AST 14*  ALT 9  ALKPHOS 65  BILITOT 0.3  PROT 6.9  ALBUMIN 3.1*   No results for input(s): "LIPASE", "AMYLASE" in the last 168 hours. No results for input(s): "AMMONIA" in the last 168 hours. CBC: Recent Labs  Lab 04/21/24 0435  WBC 6.1  NEUTROABS 5.1  HGB 6.9*  HCT 22.3*  MCV 113.8*  PLT 201   Cardiac Enzymes: No results for input(s): "CKTOTAL", "CKMB", "CKMBINDEX", "TROPONINI" in  the last 168 hours. BNP (last 3 results) Recent Labs    06/10/23 1521  BNP 77.5    ProBNP (last 3 results) No results for input(s): "PROBNP" in the last 8760 hours.  CBG: No results for input(s): "GLUCAP" in the last 168 hours.  Radiological Exams on Admission: No results found. Assessment/Plan YONIC GUYMON is a 73 y.o. male with medical history significant for diabetes, hypertension, hyperlipidemia, atrial fibrillation on Eliquis  being admitted to the hospital with blood loss anemia.  He has a skin tear to his right ankle, which is why he came to the ER for evaluation.   Acute blood loss anemia-due to GI bleed, possibly upper GI bleed though difficult to determine currently.  Patient without melena or frank blood in his stools.  He does have a history of prior colectomy.  He is hemodynamically stable. -Observation admission -Transfuse PRBC -Avoid blood thinners, will discontinue Eliquis  for the time being -Keep n.p.o. -IV Protonix  -ER provider has discussed with [facility] who will see the patient in consultation  Non-insulin -dependent type 2 diabetes-hold oral agents, placed on sliding scale insulin  while n.p.o.  Chronic atrial fibrillation-heart rate is currently controlled, holding Eliquis  as above  Chronic heart failure with preserved EF-patient appears euvolemic and well compensated on exam.  Will continue his home Lasix , with potassium supplementation.  Hyperlipidemia-Lipitor  Skin tear-no evidence of acute infection, will consult wound care  DVT prophylaxis: SCDs    Code Status: Full Code  Consults called: Gastroenterology Dr. Willy Harvest  Admission status: Observation  Time spent: 49 minutes  Sabri Teal Rickey Charm MD [facility] Pager [phone]  If 7PM-7AM, please contact night-coverage [email or web address] Password [email or web address]  04/21/2024, 8:16 AM

## 2024-04-21 NOTE — ED Triage Notes (Signed)
 Pt sent from facility for wound on right foot. EMS reports that on Tuesday, staff removed patient sock which tore skin off of sore. Another sock was placed directly back over wound which is now adhered to it. Denies fever. Pt awake and alert.

## 2024-04-21 NOTE — ED Notes (Signed)
 Paper consent for blood product placed in red folder by Diplomatic Services operational officer

## 2024-04-21 NOTE — ED Provider Notes (Signed)
 [place] EMERGENCY DEPARTMENT AT [facility] Provider Note   CSN: [id number] Arrival date & time: 04/21/24  0407     History  Chief Complaint  Patient presents with   Wound Infection   HPI [name] is a 73 y.o. male with A-fib on Eliquis , CKD, diabetes, iron deficient anemia, RBBB presenting for wound infection.  Wound is located at the medial aspect of the right lower leg just above the ankle.  States that occurred Tuesday.  He lives at an assisted living facility and staff is removing his sock when it tore the skin in that area.  He states that the skin is not healing at all and another sock was immediately placed on his right foot adhering to the wound.  Patient denies shortness of breath, fatigue and bloody stools.  HPI     Home Medications Prior to Admission medications   Medication Sig Start Date End Date Taking? Authorizing Provider  acetaminophen  (TYLENOL ) 325 MG tablet Take 650 mg by mouth every 4 (four) hours as needed for moderate pain (pain score 4-6), fever or mild pain (pain score 1-3).    [provider]  apixaban  (ELIQUIS ) 5 MG TABS tablet Take 1 tablet (5 mg total) by mouth 2 (two) times daily. 02/02/22   Walker, Caitlin S, NP  atorvastatin  (LIPITOR) 20 MG tablet Take 20 mg by mouth every evening. 02/02/21   [provider]  benzonatate (TESSALON) 100 MG capsule Take 100 mg by mouth 3 (three) times daily as needed for cough.    [provider]  docusate sodium  (COLACE) 100 MG capsule Take 1 capsule (100 mg total) by mouth 2 (two) times daily. 12/08/16   Justina Oman, MD  ferrous sulfate  (FEROSUL) 325 (65 FE) MG tablet Take 325 mg by mouth daily.    [provider]  fluocinolone 0.01 % cream Apply 1 Application topically See admin instructions. Apply a thin layer to lower right leg 2 times a day 05/04/23   [provider]  furosemide  (LASIX ) 80 MG tablet Take 80 mg by mouth daily. 08/26/22   [provider]  glipiZIDE  (GLUCOTROL  XL) 10 MG 24 hr tablet Take 10 mg by mouth daily.    [provider]  Glucagon (GVOKE HYPOPEN 1-PACK) 1 MG/0.2ML SOAJ Inject 1 mg into the skin as needed (for emergency of severe hypoglycemia).    [provider]  hydrOXYzine  (ATARAX /VISTARIL ) 25 MG tablet Take 1 tablet (25 mg total) by mouth every 8 (eight) hours as needed for itching. Use caution as this medication may make you drowsy Patient taking differently: Take 25 mg by mouth every 8 (eight) hours as needed for itching (per MAR). 07/17/20   Little, Hebert Littler, MD  metFORMIN  (GLUCOPHAGE -XR) 500 MG 24 hr tablet Take 1,000 mg by mouth in the morning and at bedtime.    [provider]  NYAMYC  powder Apply 1 Application topically See admin instructions. Apply to affected areas 2 times a day 06/08/23   [provider]  polyethylene glycol (MIRALAX ) packet Take 17 g by mouth daily. Hold for diarrhea Patient taking differently: Take 17 g by mouth See admin instructions. Mix 17 grams into 8 ounces of water, coffee, tea, or juice and drink by mouth once a day 12/08/16   Justina Oman, MD  potassium chloride  SA (KLOR-CON  M) 20 MEQ tablet Take 20 mEq by mouth daily. 08/26/22   [provider]  tamsulosin  (FLOMAX ) 0.4 MG CAPS capsule Take 1 capsule (0.4  mg total) by mouth daily. Patient taking differently: Take 0.4 mg by mouth every evening. 11/27/16   Justina Oman, MD  traZODone  (DESYREL ) 100 MG tablet Take 100 mg by mouth at bedtime.    [provider]      Allergies    Patient has no known allergies.    Review of Systems   See HPI  Physical Exam Updated Vital Signs BP (!) 143/53   Pulse 71   Temp 98.3 F (36.8 C) (Oral)   Resp 19   Ht 6\' 3"  (1.905 m)   Wt 102.5 kg   SpO2 98%   BMI 28.25 kg/m  Physical Exam Vitals and nursing note reviewed. Exam conducted with a chaperone present.  HENT:     Head: Normocephalic and atraumatic.      Mouth/Throat:     Mouth: Mucous membranes are moist.  Eyes:     General:        Right eye: No discharge.        Left eye: No discharge.     Conjunctiva/sclera: Conjunctivae normal.  Cardiovascular:     Rate and Rhythm: Normal rate and regular rhythm.     Pulses: Normal pulses.     Heart sounds: Normal heart sounds.  Pulmonary:     Effort: Pulmonary effort is normal.     Breath sounds: Normal breath sounds.  Abdominal:     General: Abdomen is flat.     Palpations: Abdomen is soft.  Genitourinary:    Rectum: Guaiac result positive. No mass, tenderness, anal fissure or external hemorrhoid. Normal anal tone.     Comments: Stools melanotic Musculoskeletal:       Legs:  Skin:    General: Skin is warm and dry.     Coloration: Skin is pale.  Neurological:     General: No focal deficit present.  Psychiatric:        Mood and Affect: Mood normal.     ED Results / Procedures / Treatments   Labs (all labs ordered are listed, but only abnormal results are displayed) Labs Reviewed  COMPREHENSIVE METABOLIC PANEL WITH GFR - Abnormal; Notable for the following components:      Result Value   Glucose, Bld 120 (*)    BUN 24 (*)    Calcium  8.5 (*)    Albumin 3.1 (*)    AST 14 (*)    All other components within normal limits  CBC WITH DIFFERENTIAL/PLATELET - Abnormal; Notable for the following components:   RBC 1.96 (*)    Hemoglobin 6.9 (*)    HCT 22.3 (*)    MCV 113.8 (*)    MCH 35.2 (*)    RDW 15.7 (*)    Lymphs Abs 0.6 (*)    All other components within normal limits  URINALYSIS, W/ REFLEX TO CULTURE (INFECTION SUSPECTED) - Abnormal; Notable for the following components:   Glucose, UA 50 (*)    All other components within normal limits  POC OCCULT BLOOD, ED - Abnormal; Notable for the following components:   Fecal Occult Bld POSITIVE (*)    All other components within normal limits  PROTIME-INR  I-STAT CG4 LACTIC ACID, ED  TYPE AND SCREEN  PREPARE RBC (CROSSMATCH)     EKG None  Radiology No results found.  Procedures .Critical Care  Performed by: Jaleya Pebley K, PA-C Authorized by: Janalee Mcmurray, PA-C   Critical care provider statement:    Critical care time (minutes):  30   Critical care  was necessary to treat or prevent imminent or life-threatening deterioration of the following conditions: GI bleed with critical anemia requiring blood tranfusion.   Critical care was time spent personally by me on the following activities:  Development of treatment plan with patient or surrogate, discussions with consultants, evaluation of patient's response to treatment, examination of patient, ordering and review of laboratory studies, ordering and review of radiographic studies, ordering and performing treatments and interventions, pulse oximetry, re-evaluation of patient's condition and review of old charts     Medications Ordered in ED Medications  pantoprazole  (PROTONIX ) injection 40 mg (has no administration in time range)    Followed by  pantoprazole  (PROTONIX ) injection 40 mg (has no administration in time range)  0.9 %  sodium chloride  infusion (Manually program via Guardrails IV Fluids) (has no administration in time range)    ED Course/ Medical Decision Making/ A&P                                 Medical Decision Making Amount and/or Complexity of Data Reviewed Labs: ordered.   Initial Impression and Ddx 73 yo well appearing male presenting for leg wound.  Exam notable for skin tear to the right lower leg, melanotic stool, and Hemoccult positive.  He also appeared pale.  Hemoglobin is 6.9.  DDx includes upper versus lower GI bleed, symptomatic anemia, cellulitis, wound infection, sepsis, other. Patient PMH that increases complexity of ED encounter:   A-fib on Eliquis , CKD, diabetes, iron deficient anemia, RBBB  Interpretation of Diagnostics - I independent reviewed and interpreted the labs as followed: Hemoglobin 6.9, Hemoccult  positive, hyperglycemia  Patient Reassessment and Ultimate Disposition/Management Workup overall concerning for GI bleed with anemia.  Suspect upper GI bleed given appearance of stool.  Ordered 1 unit of PRBCs and placed on PPI infusion.  Per chart review, patient is followed by [facility].  Reached out to Dr. Willy Harvest of [facility] who agreed that his team would see him this morning.  Admitted to hospital service with Dr. Jannette Mend.  Will also likely need involvement by wound care for the skin tear on his right lower leg.  At this time does not appear to be infected.  Patient management required discussion with the following services or consulting groups:  Hospitalist Service and Gastroenterology  Complexity of Problems Addressed Acute complicated illness or Injury  Additional Data Reviewed and Analyzed Further history obtained from: Past medical history and medications listed in the EMR and Prior ED visit notes  Patient Encounter Risk Assessment Consideration of hospitalization         Final Clinical Impression(s) / ED Diagnoses Final diagnoses:  Gastrointestinal hemorrhage, unspecified gastrointestinal hemorrhage type  Skin tear of right lower leg without complication, initial encounter    Rx / DC Orders ED Discharge Orders     None         Janalee Mcmurray, PA-C 04/21/24 0751    Palumbo, April, MD 05/01/24 2319

## 2024-04-21 NOTE — ED Notes (Signed)
Pt unable to stand for orthostatic vs  

## 2024-04-22 ENCOUNTER — Observation Stay (HOSPITAL_COMMUNITY): Admitting: Anesthesiology

## 2024-04-22 ENCOUNTER — Encounter (HOSPITAL_COMMUNITY)
Admission: EM | Disposition: A | Discharge status: Skilled Nursing Facility | Payer: Self-pay | Source: Home / Self Care | Attending: Emergency Medicine

## 2024-04-22 ENCOUNTER — Encounter (HOSPITAL_COMMUNITY): Payer: Self-pay | Admitting: Internal Medicine

## 2024-04-22 DIAGNOSIS — K317 Polyp of stomach and duodenum: Secondary | ICD-10-CM

## 2024-04-22 DIAGNOSIS — K3189 Other diseases of stomach and duodenum: Secondary | ICD-10-CM

## 2024-04-22 DIAGNOSIS — D519 Vitamin B12 deficiency anemia, unspecified: Secondary | ICD-10-CM

## 2024-04-22 DIAGNOSIS — I509 Heart failure, unspecified: Secondary | ICD-10-CM

## 2024-04-22 DIAGNOSIS — I110 Hypertensive heart disease with heart failure: Secondary | ICD-10-CM

## 2024-04-22 DIAGNOSIS — K2950 Unspecified chronic gastritis without bleeding: Secondary | ICD-10-CM | POA: Diagnosis not present

## 2024-04-22 DIAGNOSIS — D509 Iron deficiency anemia, unspecified: Secondary | ICD-10-CM | POA: Diagnosis not present

## 2024-04-22 DIAGNOSIS — D500 Iron deficiency anemia secondary to blood loss (chronic): Secondary | ICD-10-CM | POA: Diagnosis not present

## 2024-04-22 DIAGNOSIS — R195 Other fecal abnormalities: Secondary | ICD-10-CM

## 2024-04-22 DIAGNOSIS — D510 Vitamin B12 deficiency anemia due to intrinsic factor deficiency: Secondary | ICD-10-CM | POA: Diagnosis not present

## 2024-04-22 HISTORY — PX: ESOPHAGOGASTRODUODENOSCOPY: SHX5428

## 2024-04-22 HISTORY — PX: BONE BIOPSY: SHX375

## 2024-04-22 LAB — CBC
HCT: 23.9 % — ABNORMAL LOW (ref 39.0–52.0)
Hemoglobin: 7.2 g/dL — ABNORMAL LOW (ref 13.0–17.0)
MCH: 34 pg (ref 26.0–34.0)
MCHC: 30.1 g/dL (ref 30.0–36.0)
MCV: 112.7 fL — ABNORMAL HIGH (ref 80.0–100.0)
Platelets: 181 10*3/uL (ref 150–400)
RBC: 2.12 MIL/uL — ABNORMAL LOW (ref 4.22–5.81)
RDW: 16.9 % — ABNORMAL HIGH (ref 11.5–15.5)
WBC: 4.3 10*3/uL (ref 4.0–10.5)
nRBC: 0 % (ref 0.0–0.2)

## 2024-04-22 LAB — BASIC METABOLIC PANEL WITH GFR
Anion gap: 9 (ref 5–15)
BUN: 17 mg/dL (ref 8–23)
CO2: 26 mmol/L (ref 22–32)
Calcium: 8.2 mg/dL — ABNORMAL LOW (ref 8.9–10.3)
Chloride: 100 mmol/L (ref 98–111)
Creatinine, Ser: 1.1 mg/dL (ref 0.61–1.24)
GFR, Estimated: >60 mL/min (ref >60)
Glucose, Bld: 108 mg/dL — ABNORMAL HIGH (ref 70–99)
Potassium: 4.1 mmol/L (ref 3.5–5.1)
Sodium: 135 mmol/L (ref 135–145)

## 2024-04-22 LAB — GLUCOSE, CAPILLARY
Glucose-Capillary: 104 mg/dL — ABNORMAL HIGH (ref 70–99)
Glucose-Capillary: 110 mg/dL — ABNORMAL HIGH (ref 70–99)
Glucose-Capillary: 112 mg/dL — ABNORMAL HIGH (ref 70–99)
Glucose-Capillary: 112 mg/dL — ABNORMAL HIGH (ref 70–99)
Glucose-Capillary: 113 mg/dL — ABNORMAL HIGH (ref 70–99)
Glucose-Capillary: 114 mg/dL — ABNORMAL HIGH (ref 70–99)
Glucose-Capillary: 160 mg/dL — ABNORMAL HIGH (ref 70–99)

## 2024-04-22 LAB — PREPARE RBC (CROSSMATCH)

## 2024-04-22 SURGERY — EGD (ESOPHAGOGASTRODUODENOSCOPY)
Anesthesia: Monitor Anesthesia Care

## 2024-04-22 MED ORDER — VITAMIN B-12 1000 MCG PO TABS: 1000.0000 ug | ORAL_TABLET | Freq: Every day | ORAL | Status: AC

## 2024-04-22 MED ORDER — SODIUM CHLORIDE 0.9 % IV SOLN: 10.0000 mL/h | Freq: Once | INTRAVENOUS | Status: AC

## 2024-04-22 MED ORDER — LIDOCAINE 2% (20 MG/ML) 5 ML SYRINGE
INTRAMUSCULAR | Status: DC | PRN
  Administered 2024-04-22: 80 mg via INTRAVENOUS

## 2024-04-22 MED ORDER — MUPIROCIN 2 % EX OINT: TOPICAL_OINTMENT | Freq: Two times a day (BID) | CUTANEOUS | Status: AC

## 2024-04-22 MED ORDER — PROPOFOL 10 MG/ML IV BOLUS
INTRAVENOUS | Status: DC | PRN
  Administered 2024-04-22: 30 mg via INTRAVENOUS

## 2024-04-22 MED ORDER — HYDROCERIN EX CREA: 1.0000 | TOPICAL_CREAM | Freq: Two times a day (BID) | CUTANEOUS | Status: DC

## 2024-04-22 MED ORDER — SODIUM CHLORIDE 0.9% IV SOLUTION: Freq: Once | INTRAVENOUS | Status: AC

## 2024-04-22 MED ORDER — GLYCOPYRROLATE PF 0.2 MG/ML IJ SOSY
PREFILLED_SYRINGE | INTRAMUSCULAR | Status: DC | PRN
  Administered 2024-04-22: .2 mg via INTRAVENOUS

## 2024-04-22 MED ORDER — PROPOFOL 500 MG/50ML IV EMUL
INTRAVENOUS | Status: DC | PRN
Start: 2024-04-22 — End: 2024-04-22
  Administered 2024-04-22: 125 ug/kg/min via INTRAVENOUS

## 2024-04-22 NOTE — Plan of Care (Signed)
D/c instructions

## 2024-04-22 NOTE — Care Management Obs Status (Signed)
 MEDICARE OBSERVATION STATUS NOTIFICATION   Patient Details  Name: [name] MRN: [id number] Date of Birth: 05-Apr-1951   Medicare Observation Status Notification Given:  Yes    Levie Ream, RN 04/22/2024, 11:40 AM

## 2024-04-22 NOTE — Transfer of Care (Signed)
 Immediate Anesthesia Transfer of Care Note  Patient: [name]  Procedure(s) Performed: EGD (ESOPHAGOGASTRODUODENOSCOPY) BIOPSY, GI  Patient Location: PACU  Anesthesia Type:MAC  Level of Consciousness: awake, alert , and oriented  Airway & Oxygen Therapy: Patient Spontanous Breathing and Patient connected to face mask oxygen  Post-op Assessment: Report given to RN and Post -op Vital signs reviewed and stable  Post vital signs: Reviewed and stable  Last Vitals:  Vitals Value Taken Time  BP    Temp    Pulse    Resp 15 04/22/24 0906  SpO2    Vitals shown include unfiled device data.  Last Pain:  Vitals:   04/22/24 0750  TempSrc: Temporal  PainSc: 0-No pain         Complications: No notable events documented.

## 2024-04-22 NOTE — Progress Notes (Signed)
 [facility] called for transport.

## 2024-04-22 NOTE — Hospital Course (Signed)
 73yo with h/o DM, HTN, HLD, T1N0M0 cecal cancer s/p colectomy (2017), and afib on Eliquis  who presented on 5/10 with a R ankle wound.  He was noted to have acute anemia and was heme positive so GI was consulted, concern for UGI bleed.  Eliquis  held.  EGD on 5/11.

## 2024-04-22 NOTE — TOC Initial Note (Addendum)
 Transition of Care [facility]) - Initial/Assessment Note    Patient Details  Name: [name] MRN: [id number] Date of Birth: 01-07-1951  Transition of Care [facility]) CM/SW Contact:    Levie Ream, RN Phone Number: 04/22/2024, 12:30 PM  Clinical Narrative:                 -1055- pt from [employer]; spoke w/ Briana Campbell. MT at facility; she verified pt's residency, and says he can return today; she gave RM # [phone], call report # [phone]; pt transport by [facility]; she requested  D/C summary, FL2, medication list, and new orders be sent w/ pt to facility.  -1130- spoke w/ pt in room; pt agrees to d/c plan; pt says he has wheelchair, and walker; he does not have glasses or HAs; pt says he does not wear his upper/lower dentures; pt says he is continent of bowel/bladder, and he can feed himself; will call [facility] for transport when d/c orders received.  -1257- pt to receive PRBCs; Shelvy Dickens, RN will call report and [facility] for transport when pt ready for d/c [phone], option 1, then 0ption 3); D/C summary, FL2, wound care orders, and amb referral to IV iron therapy placed in d/c packet.  Expected Discharge Plan: Skilled Nursing Facility Barriers to Discharge: No Barriers Identified   Patient Goals and CMS Choice Patient states their goals for this hospitalization and ongoing recovery are:: back to [employer].gov Compare Post Acute Care list provided to:: Patient   [place] ownership interest in [facility].provided to:: Patient    Expected Discharge Plan and Services   Discharge Planning Services: CM Consult   Living arrangements for the past 2 months: Assisted Living Facility Expected Discharge Date: 04/22/24               DME Arranged: N/A DME Agency: NA       HH Arranged: NA HH Agency: NA        Prior Living Arrangements/Services Living arrangements for the past 2 months: Assisted Living Facility Lives with:: Facility Resident Patient  language and need for interpreter reviewed:: Yes Do you feel safe going back to the place where you live?: Yes      Need for Family Participation in Patient Care: Yes (Comment) Care giver support system in place?: Yes (comment) Current home services: DME (walker, wheelchair) Criminal Activity/Legal Involvement Pertinent to Current Situation/Hospitalization: No - Comment as needed  Activities of Daily Living   ADL Screening (condition at time of admission) Independently performs ADLs?: Yes (appropriate for developmental age) Is the patient deaf or have difficulty hearing?: No Does the patient have difficulty seeing, even when wearing glasses/contacts?: No Does the patient have difficulty concentrating, remembering, or making decisions?: No  Permission Sought/Granted Permission sought to share information with : Case Manager Permission granted to share information with : Yes, Verbal Permission Granted  Share Information with NAME: Case Manager     Permission granted to share info w Relationship: Branda Cain (friend) [phone]     Emotional Assessment Appearance:: Appears stated age Attitude/Demeanor/Rapport: Gracious Affect (typically observed): Accepting Orientation: : Oriented to Self, Oriented to Place, Oriented to  Time, Oriented to Situation Alcohol  / Substance Use: Not Applicable Psych Involvement: No (comment)  Admission diagnosis:  Blood loss anemia [D50.0] Skin tear of right lower leg without complication, initial encounter [B28.413K] Gastrointestinal hemorrhage, unspecified gastrointestinal hemorrhage type [K92.2] Patient Active Problem List   Diagnosis Date Noted   Heme + stool 04/22/2024  Anemia due to vitamin B12 deficiency 04/22/2024   Blood loss anemia 04/21/2024   Gastrointestinal hemorrhage 04/21/2024   Aortic root dilation (HCC) 02/17/2024   Lymphedema 10/12/2023   Cellulitis 10/11/2023   Osteomyelitis of left foot (HCC) 10/30/2022   Cellulitis of left  foot 10/27/2022   IDA (iron deficiency anemia) 09/10/2022   Chronic diastolic CHF (congestive heart failure) (HCC) 09/10/2022   CKD (chronic kidney disease) stage 2, GFR 60-89 ml/min 09/09/2022   Chronic anticoagulation 09/09/2022   History of colon cancer 09/09/2022   Anemia of chronic disease 09/09/2022   Venous stasis ulcers- BLE 09/09/2022   Acute cholecystitis due to biliary calculus 09/09/2022   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   Subacute eczematous & allergic contact dermatitis  09/09/2022   History of COVID-19 09/09/2022   History of homelessness 09/09/2022   Diverticulosis of colon 09/09/2022   Cholelithiasis 09/09/2022   Hepatic steatosis 09/09/2022   Living in assisted living 09/09/2022   Pain in right foot 05/06/2022   Atrial fibrillation with slow ventricular response (HCC) 12/23/2021   Pain of joint of left ankle and foot 02/26/2021   BPH (benign prostatic hyperplasia) 11/30/2016   Abnormal CT scan, colon    AKI (acute kidney injury) (HCC)    Generalized weakness    Abdominal pain    Moderate protein-calorie malnutrition (HCC) 11/10/2016   Constipation, chronic 11/09/2016   CKD (chronic kidney disease) stage 3, GFR 30-59 ml/min (HCC) 11/09/2016   Hyperbilirubinemia 11/09/2016   Hyponatremia 11/08/2016   RBBB 09/13/2013   Snoring 09/13/2013   Edema 09/07/2013   DM2 (diabetes mellitus, type 2) (HCC) 03/16/2013   Arthritis 03/16/2013   Essential hypertension, benign 02/23/2013   Morbid obesity (HCC) 02/23/2013   Paroxysmal atrial fibrillation (HCC) 02/23/2013   HLD (hyperlipidemia) 02/23/2013   PCP:  Vevelyn Gowers, NP Pharmacy:  No Pharmacies Listed    Social Drivers of Health (SDOH) Social History: SDOH Screenings   Food Insecurity: No Food Insecurity (04/22/2024)  Housing: Low Risk  (04/22/2024)  Transportation Needs: No Transportation Needs (04/22/2024)  Utilities: Not At Risk (04/22/2024)  Social Connections: Moderately  Isolated (04/21/2024)  Tobacco Use: Medium Risk (04/22/2024)   SDOH Interventions: Food Insecurity Interventions: Intervention Not Indicated, Inpatient TOC Housing Interventions: Intervention Not Indicated, Inpatient TOC Transportation Interventions: Intervention Not Indicated, Inpatient TOC Utilities Interventions: Intervention Not Indicated, Inpatient TOC   Readmission Risk Interventions    12/21/2023   11:59 AM 10/14/2023    2:54 PM 10/28/2022   12:37 PM  Readmission Risk Prevention Plan  Transportation Screening Complete Complete Complete  PCP or Specialist Appt within 5-7 Days Complete Complete Complete  Home Care Screening Complete Complete Complete  Medication Review (RN CM) Complete Complete Complete

## 2024-04-22 NOTE — NC FL2 (Signed)
 [place]  MEDICAID FL2 LEVEL OF CARE FORM     IDENTIFICATION  Patient Name: [name] Birthdate: Feb 04, 1951 Sex: male Admission Date (Current Location): 04/21/2024  [place] Number:  [name] [id number] Facility and Address:  [facility],  [address], [place] [address]      Provider Number: [id number]  Attending Physician Name and Address:  Lorita Rosa, MD  Relative Name and Phone Number:  Branda Cain (friend) [phone]    Current Level of Care: Hospital Recommended Level of Care: Assisted Living Facility Prior Approval Number:    Date Approved/Denied:   PASRR Number: [id number]  Discharge Plan: SNF    Current Diagnoses: Patient Active Problem List   Diagnosis Date Noted   Heme + stool 04/22/2024   Anemia due to vitamin B12 deficiency 04/22/2024   Blood loss anemia 04/21/2024   Gastrointestinal hemorrhage 04/21/2024   Aortic root dilation (HCC) 02/17/2024   Lymphedema 10/12/2023   Cellulitis 10/11/2023   Osteomyelitis of left foot (HCC) 10/30/2022   Cellulitis of left foot 10/27/2022   IDA (iron deficiency anemia) 09/10/2022   Chronic diastolic CHF (congestive heart failure) (HCC) 09/10/2022   CKD (chronic kidney disease) stage 2, GFR 60-89 ml/min 09/09/2022   Chronic anticoagulation 09/09/2022   History of colon cancer 09/09/2022   Anemia of chronic disease 09/09/2022   Venous stasis ulcers- BLE 09/09/2022   Acute cholecystitis due to biliary calculus 09/09/2022   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   Subacute eczematous & allergic contact dermatitis  09/09/2022   History of COVID-19 09/09/2022   History of homelessness 09/09/2022   Diverticulosis of colon 09/09/2022   Cholelithiasis 09/09/2022   Hepatic steatosis 09/09/2022   Living in assisted living 09/09/2022   Pain in right foot 05/06/2022   Atrial fibrillation with slow ventricular response (HCC) 12/23/2021   Pain of joint of  left ankle and foot 02/26/2021   BPH (benign prostatic hyperplasia) 11/30/2016   Abnormal CT scan, colon    AKI (acute kidney injury) (HCC)    Generalized weakness    Abdominal pain    Moderate protein-calorie malnutrition (HCC) 11/10/2016   Constipation, chronic 11/09/2016   CKD (chronic kidney disease) stage 3, GFR 30-59 ml/min (HCC) 11/09/2016   Hyperbilirubinemia 11/09/2016   Hyponatremia 11/08/2016   RBBB 09/13/2013   Snoring 09/13/2013   Edema 09/07/2013   DM2 (diabetes mellitus, type 2) (HCC) 03/16/2013   Arthritis 03/16/2013   Essential hypertension, benign 02/23/2013   Morbid obesity (HCC) 02/23/2013   Paroxysmal atrial fibrillation (HCC) 02/23/2013   HLD (hyperlipidemia) 02/23/2013    Orientation RESPIRATION BLADDER Height & Weight     Self, Time, Situation  Normal Incontinent Weight: 102.5 kg Height:  6\' 3"  (190.5 cm)  BEHAVIORAL SYMPTOMS/MOOD NEUROLOGICAL BOWEL NUTRITION STATUS      Incontinent Diet (carb modified)  AMBULATORY STATUS COMMUNICATION OF NEEDS Skin   Total Care Verbally Bruising, Other (Comment) (Right ankle wound; erythema right leg; scratch marks to abdomen and buttocks; skin dry flaky)                       Personal Care Assistance Level of Assistance  Bathing, Feeding, Dressing Bathing Assistance: Limited assistance Feeding assistance: Independent Dressing Assistance: Limited assistance     Functional Limitations Info  Sight, Hearing, Speech Sight Info: Adequate Hearing Info: Adequate Speech Info: Adequate    SPECIAL CARE FACTORS FREQUENCY  Contractures Contractures Info: Not present    Additional Factors Info  Code Status, Allergies, Insulin  Sliding Scale Code Status Info: Full Code Allergies Info: NKDA   Insulin  Sliding Scale Info: See MAR       Current Medications (04/22/2024):  This is the current hospital active medication list Current Facility-Administered Medications  Medication Dose  Route Frequency Provider Last Rate Last Admin   0.9 %  sodium chloride  infusion (Manually program via Guardrails IV Fluids)   Intravenous Once Kenney Peacemaker, MD       0.9 %  sodium chloride  infusion (Manually program via Guardrails IV Fluids)   Intravenous Once Lorita Rosa, MD       0.9 %  sodium chloride  infusion  10 mL/hr Intravenous Once Germer[place], John, MD       acetaminophen  (TYLENOL ) tablet 650 mg  650 mg Oral Q6H PRN Kenney Peacemaker, MD       Or   acetaminophen  (TYLENOL ) suppository 650 mg  650 mg Rectal Q6H PRN Kenney Peacemaker, MD       albuterol  (PROVENTIL ) (2.5 MG/3ML) 0.083% nebulizer solution 2.5 mg  2.5 mg Nebulization Q2H PRN Gessner, Carl E, MD       hydrocerin (EUCERIN) cream   Topical BID Kenney Peacemaker, MD       insulin  aspart (novoLOG ) injection 0-15 Units  0-15 Units Subcutaneous Q4H Kenney Peacemaker, MD   3 Units at 04/21/24 2052   mupirocin  ointment (BACTROBAN ) 2 %   Topical BID Kenney Peacemaker, MD   Given at 04/21/24 1503   ondansetron  (ZOFRAN ) tablet 4 mg  4 mg Oral Q6H PRN Kenney Peacemaker, MD       Or   ondansetron  (ZOFRAN ) injection 4 mg  4 mg Intravenous Q6H PRN Kenney Peacemaker, MD         Discharge Medications: Please see discharge summary for a list of discharge medications.  Relevant Imaging Results:  Relevant Lab Results:   Additional Information SSN [id number]  Levie Ream, RN

## 2024-04-22 NOTE — Progress Notes (Signed)
 Second attempt to call report to facility. Report Raquel accepted report on the pt. No questions and she understand that pt is receiving a unfit of blood and will return after. Informed facility that pt will have dinner prior to return. Cont with care

## 2024-04-22 NOTE — Op Note (Addendum)
 [facility] Patient Name: [name] Procedure Date: 04/22/2024 MRN: [id number] Attending MD: Kenney Peacemaker , MD, [id number] Date of Birth: 17-Sep-1951 CSN: [id number] Age: 73 Admit Type: Inpatient Procedure:                Upper GI endoscopy Indications:              Decreased hemoglobin and anemia question GI bleed.                            Hemoglobin 6.9 down from 9.6 several months ago.                            Heme positive stool patient with chronic macrocytic                            anemia and low B12 (162 yesterday) not treated,                            black stools on ferrous sulfate  with ferritin 17                            yesterday. Colonoscopy September 2024 status post                            right hemicolectomy (history of cecal cancer),                            internal hemorrhoids and left-sided diverticulosis. Providers:                Kenney Peacemaker, MD, Perla Bradford, RN, Rinda Cheers,                            Technician Referring MD:              Medicines:                Monitored Anesthesia Care Complications:            No immediate complications. Estimated Blood Loss:     Estimated blood loss was minimal. Procedure:                Pre-Anesthesia Assessment:                           - Prior to the procedure, a History and Physical                            was performed, and patient medications and                            allergies were reviewed. The patient's tolerance of                            previous anesthesia was also reviewed. The risks  and benefits of the procedure and the sedation                            options and risks were discussed with the patient.                            All questions were answered, and informed consent                            was obtained. Prior Anticoagulants: The patient                            last took Eliquis  (apixaban ) 2 days prior to the                             procedure. ASA Grade Assessment: IV - A patient                            with severe systemic disease that is a constant                            threat to life. After reviewing the risks and                            benefits, the patient was deemed in satisfactory                            condition to undergo the procedure.                           After obtaining informed consent, the endoscope was                            passed under direct vision. Throughout the                            procedure, the patient's blood pressure, pulse, and                            oxygen saturations were monitored continuously. The                            [id number]-[id number] ([id number] Olympus endoscope was introduced                            through the mouth, and advanced to the second part                            of duodenum. The upper GI endoscopy was                            accomplished without difficulty. The patient  tolerated the procedure well. Scope In: Scope Out: Findings:      Multiple 1 to 2 mm sessile polyps with no bleeding and no stigmata of       recent bleeding were found in the gastric fundus and in the gastric       body. Biopsies were taken with a cold forceps for histology.       Verification of patient identification for the specimen was done.       Estimated blood loss was minimal.      Localized congested mucosa without active bleeding and with no stigmata       of bleeding was found in the second portion of the duodenum. Biopsies       were taken with a cold forceps for histology. Verification of patient       identification for the specimen was done. Estimated blood loss was       minimal.      The exam was otherwise without abnormality.      The cardia and gastric fundus were otherwise normal on retroflexion.      Biopsies for histology were taken with a cold forceps in the duodenal       bulb for evaluation  of celiac disease. Verification of patient       identification for the specimen was done. Estimated blood loss was       minimal.      Biopsies were taken with a cold forceps at the incisura and in the       gastric antrum for histology. Verification of patient identification for       the specimen was done. Estimated blood loss was minimal. Impression:               - Multiple gastric polyps. Biopsied. These are tiny                            almost nodular, likely clinically inconsequential.                           - Congested duodenal mucosa proximal D2. Biopsied.                           - The examination was otherwise normal. I did not                            see obvious atrophic gastritis. Biopsies of the                            normal-appearing antrum and the duodenal bulb were                            also taken Moderate Sedation:      Not Applicable - Patient had care per Anesthesia. Recommendation:           - Return patient to hospital ward for ongoing care.                           - Start B12 replacement  Continue iron replacement                           I will follow-up pathology and arrange any needed                            GI follow-up after discharge.                           I recommend transfusion of an additional unit of                            PRBC.                           Would also try to administer parenteral iron if                            possible (or soon after discharge if could be                            arranged)                           I do not think he needs a repeat colonoscopy or                            other endoscopic evaluation. Anemia appears to be                            multifactorial and is an exacerbation of a chronic                            issue. He has internal hemorrhoids seen September                            2020 for colonoscopy that could cause the heme                             positive stool and he gives a very clear history of                            chronically dark stools. Need to await biopsies to                            determine possible malabsorption e.g. celiac or                            other abnormalities that could lead to B12 and/or                            iron deficiency.                           Carb modified diet  diet                           Will follow if patient is still here tomorrow                           Resume Eliquis  tomorrow                           I do not see a clear idication for PPI so dc Procedure Code(s):        --- Professional ---                           [phone], Esophagogastroduodenoscopy, flexible,                            transoral; with biopsy, single or multiple Diagnosis Code(s):        --- Professional ---                           K31.7, Polyp of stomach and duodenum                           K31.89, Other diseases of stomach and duodenum                           D50.9, Iron deficiency anemia, unspecified                           D51.0, Vitamin B12 deficiency anemia due to                            intrinsic factor deficiency                           R19.5, Other fecal abnormalities CPT copyright 2022 [employer]. All rights reserved. The codes documented in this report are preliminary and upon coder review may  be revised to meet current compliance requirements. Kenney Peacemaker, MD 04/22/2024 9:13:32 AM This report has been signed electronically. Number of Addenda: 0

## 2024-04-22 NOTE — Discharge Summary (Addendum)
 Physician Discharge Summary   Patient: [name] MRN: [id number] DOB: 07/27/1951  Admit date:     04/21/2024  Discharge date: 04/22/24  Discharge Physician: Lorita Rosa   PCP: Vevelyn Gowers, NP   Recommendations at discharge:   You are being discharged back to [facility] Follow up with CBC in 1-2 days; transfuse as needed for Hgb <7 Follow up with NP Raquel Cables this coming week Resume Eliquis  on 5/12 and monitor for recurrent bleeding Continue with wound care Start B12 supplementation You are being referred for outpatient IV iron replacement  Discharge Diagnoses: Principal Problem:   Blood loss anemia Active Problems:   Heme + stool   Anemia due to vitamin B12 deficiency    Hospital Course: 73yo with h/o DM, HTN, HLD, T1N0M0 cecal cancer s/p colectomy (2017), and afib on Eliquis  who presented on 5/10 with a R ankle wound.  He was noted to have acute anemia and was heme positive so GI was consulted, concern for UGI bleed.  Eliquis  held.  EGD on 5/11.  Assessment and Plan:  Acute blood loss anemia Concern for GI bleeding on presentation based on anemia, heme positive stool Observed overnight Transfused 1 unit PRBC with increase in Hgb to 7.2; will give an additional unit prior to dc Will order outpatient IV iron infusion but iron level was 48 so likely ABLA rather than iron deficiency; continue PO supplementation for now GI consulted and underwent EGD - multiple gastric polyps (biopsied, likely not clinically significant) with congested duodenal mucosa (biopsied) Recommended for B12 replacement Tolerating diet without difficulty Resume Eliquis  tomorrow per GI  Skin tear Wound care consulted Continue wound care at the facility  DM Last A1c was 7, good control Resume glipizide , metformin  Carb modified diet   Chronic atrial fibrillation Rate controlled without medication Resume Eliquis  on 5/12   Chronic heart failure with preserved EF Euvolemic Continue  Lasix , with potassium supplementation.   Hyperlipidemia Continue Lipitor  BPH Continue tamsulosin        Consultants: GI   Procedures: EGD 5/11   Antibiotics: None    Pain control - [place]  Controlled Substance Reporting System database was reviewed. and patient was instructed, not to drive, operate heavy machinery, perform activities at heights, swimming or participation in water activities or provide baby-sitting services while on Pain, Sleep and Anxiety Medications; until their outpatient Physician has advised to do so again. Also recommended to not to take more than prescribed Pain, Sleep and Anxiety Medications.   Disposition: Skilled nursing facility Diet recommendation:  Carb modified diet DISCHARGE MEDICATION:  DISCHARGE MEDICATION: Allergies as of 04/22/2024   No Known Allergies      Medication List     PAUSE taking these medications    apixaban  5 MG Tabs tablet Wait to take this until: Apr 23, 2024 Commonly known as: ELIQUIS  Take 1 tablet (5 mg total) by mouth 2 (two) times daily.       STOP taking these medications    acetaminophen  325 MG tablet Commonly known as: TYLENOL    Nyamyc  powder Generic drug: nystatin    polyethylene glycol 17 g packet Commonly known as: MiraLax        TAKE these medications    atorvastatin  20 MG tablet Commonly known as: LIPITOR Take 20 mg by mouth every evening.   benzonatate 100 MG capsule Commonly known as: TESSALON Take 100 mg by mouth 3 (three) times daily as needed for cough.   cyanocobalamin  1000 MCG tablet Commonly known as: VITAMIN B12 Take 1  tablet (1,000 mcg total) by mouth daily.   docusate sodium  100 MG capsule Commonly known as: COLACE Take 1 capsule (100 mg total) by mouth 2 (two) times daily.   FeroSul 325 (65 FE) MG tablet Generic drug: ferrous sulfate  Take 325 mg by mouth daily.   fluocinolone 0.01 % cream Apply 1 Application topically in the morning and at bedtime. Apply  to  lower right leg 2 times a day   furosemide  80 MG tablet Commonly known as: LASIX  Take 80 mg by mouth daily.   glipiZIDE  10 MG 24 hr tablet Commonly known as: GLUCOTROL  XL Take 10 mg by mouth daily.   Gvoke HypoPen 1-Pack 1 MG/0.2ML Soaj Generic drug: Glucagon Inject 1 mg into the skin as needed (for emergency of severe hypoglycemia).   hydrocerin Crea Apply 1 Application topically 2 (two) times daily.   hydrOXYzine  25 MG tablet Commonly known as: ATARAX  Take 1 tablet (25 mg total) by mouth every 8 (eight) hours as needed for itching. Use caution as this medication may make you drowsy What changed: additional instructions   metFORMIN  500 MG 24 hr tablet Commonly known as: GLUCOPHAGE -XR Take 1,000 mg by mouth in the morning and at bedtime.   mupirocin  ointment 2 % Commonly known as: BACTROBAN  Apply topically 2 (two) times daily.   potassium chloride  SA 20 MEQ tablet Commonly known as: KLOR-CON  M Take 20 mEq by mouth daily.   tamsulosin  0.4 MG Caps capsule Commonly known as: FLOMAX  Take 1 capsule (0.4 mg total) by mouth daily. What changed: when to take this   traZODone  100 MG tablet Commonly known as: DESYREL  Take 100 mg by mouth at bedtime.               Discharge Care Instructions  (From admission, onward)           Start     Ordered   04/22/24 0000  Discharge wound care:       Comments: Cleanse R lower leg wound with NS, apply Mupirocin  ointment 2 times daily to wound bed, cover with Telfa nonstick dressing and secure with silicone foam or dry gauze and Kerlix roll gauze anchored around foot whichever is preferred.   04/22/24 1214             Subjective: Feeling well post-EGD.  Wants to eat.  No specific concerns.  Feels like he can go back to his facility, but he also really loves it here, especially the food.   Objective: Vitals:   04/22/24 0907 04/22/24 0915  BP: 130/65 115/64  Pulse: 80 73  Resp: 19 13  Temp: 98 F (36.7 C)   SpO2:  98% 100%    Intake/Output Summary (Last 24 hours) at 04/22/2024 1215 Last data filed at 04/22/2024 1610 Gross per 24 hour  Intake 1318.11 ml  Output 1600 ml  Net -281.89 ml   Filed Weights   04/21/24 0417  Weight: 102.5 kg    Exam:  General:  Appears calm and comfortable and is in NAD Eyes:  EOMI, normal lids, iris ENT:  grossly normal hearing, lips & tongue, mmm; edentulous Neck:  no LAD, masses or thyromegaly Cardiovascular:  RRR. Chronic LE edema.  Respiratory:   CTA bilaterally with no wheezes/rales/rhonchi.  Normal respiratory effort. 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  Pertinent labs for today include:   Glucose 108 WBC 4.3 Hgb 7.2 s/p 1 unit PRBC Heme positive    Condition at discharge: improving  The results of significant diagnostics from this hospitalization (including imaging, microbiology, ancillary and laboratory) are listed below for reference.   Imaging Studies: ECHOCARDIOGRAM COMPLETE Result Date: 04/09/2024    ECHOCARDIOGRAM REPORT   Patient Name:   [name] Date of Exam: 04/09/2024 Medical Rec #:  [id number]       Height:       75.5 in Accession #:    [id number]      Weight:       296.0 lb Date of Birth:  1951-12-05       BSA:          2.605 m Patient Age:    73 years        BP:           144/64 mmHg Patient Gender: M               HR:           63 bpm. Exam Location:  Outpatient Procedure: 2D Echo, Color Doppler and Cardiac Doppler (Both Spectral and Color            Flow Doppler were utilized during procedure). Indications:    I71.2 Ascending aortic aneurysm  History:        Patient has prior history of Echocardiogram  examinations, most                 recent 02/03/2023. CHF, CKD, Arrythmias:Atrial Fibrillation and                 RBBB; Risk Factors:Hypertension, Diabetes and Dyslipidemia.  Sonographer:    Sherline Distel Senior RDCS Referring Phys: [id number] HARRELL O LIGHTFOOT  Sonographer Comments: Scanned upright due to dyspnea, technically difficult due to lung interference. IV attempted 2x IMPRESSIONS  1. Technically difficult study  2. Left ventricular ejection fraction, by estimation, is 60 to 65%. The left ventricle has grossly normal function. Left ventricular endocardial border not optimally defined to evaluate regional wall motion. Left ventricular diastolic parameters are indeterminate.  3. Right ventricular systolic function was not well visualized. The right ventricular size is not well visualized. Tricuspid regurgitation signal is inadequate for assessing PA pressure.  4. The mitral valve is grossly normal. No evidence of mitral valve regurgitation. No evidence of mitral stenosis.  5. The aortic valve was not well visualized. Aortic valve regurgitation is not visualized. No aortic stenosis is present.  6. The inferior vena cava is dilated in size with <50% respiratory variability, suggesting right atrial pressure of 15 mmHg.  7. Poor visualization of aorta, unable to evaluate aortic aneurysm FINDINGS  Left Ventricle: Left ventricular ejection fraction, by estimation, is 60 to 65%. The left ventricle has normal function. Left ventricular endocardial border not optimally defined to evaluate regional wall motion. The left ventricular internal cavity size was normal in size. Suboptimal image quality limits for assessment of left ventricular hypertrophy. Left ventricular diastolic parameters are indeterminate. Right Ventricle: The right ventricular size is not well visualized. Right vetricular wall thickness was not well visualized. Right ventricular systolic function was not well visualized. Tricuspid regurgitation signal is  inadequate for assessing PA pressure. Left Atrium: Left atrial size was normal in size. Right Atrium: Right atrial size was not  well visualized. Pericardium: There is no evidence of pericardial effusion. Mitral Valve: The mitral valve is grossly normal. No evidence of mitral valve regurgitation. No evidence of mitral valve stenosis. Tricuspid Valve: The tricuspid valve is normal in structure. Tricuspid valve regurgitation is trivial. Aortic Valve: The aortic valve was not well visualized. Aortic valve regurgitation is not visualized. No aortic stenosis is present. Pulmonic Valve: The pulmonic valve was not well visualized. Pulmonic valve regurgitation is not visualized. Aorta: The aortic root was not well visualized. Venous: The inferior vena cava is dilated in size with less than 50% respiratory variability, suggesting right atrial pressure of 15 mmHg. IAS/Shunts: The interatrial septum was not well visualized.  LEFT VENTRICLE PLAX 2D LVOT diam:     2.00 cm LV SV:         41 LV SV Index:   16 LVOT Area:     3.14 cm  IVC IVC diam: 2.60 cm LEFT ATRIUM             Index LA Vol (A2C):   52.2 ml 20.04 ml/m LA Vol (A4C):   74.1 ml 28.44 ml/m LA Biplane Vol: 65.6 ml 25.18 ml/m  AORTIC VALVE LVOT Vmax:   64.70 cm/s LVOT Vmean:  45.000 cm/s LVOT VTI:    0.132 m  AORTA Ao Root diam: 4.20 cm MITRAL VALVE MV Area (PHT): 3.56 cm     SHUNTS MV Decel Time: 213 msec     Systemic VTI:  0.13 m MV E velocity: 110.00 cm/s  Systemic Diam: 2.00 cm Carson Clara MD Electronically signed by Carson Clara MD Signature Date/Time: 04/09/2024/4:15:14 PM    Final     Labs: CBC: Recent Labs  Lab 04/21/24 0435 04/21/24 1548 04/22/24 0609  WBC 6.1 4.9 4.3  NEUTROABS 5.1  --   --   HGB 6.9* 7.1* 7.2*  HCT 22.3* 23.6* 23.9*  MCV 113.8* 112.9* 112.7*  PLT 201 185 181   Basic Metabolic Panel: Recent Labs  Lab 04/21/24 0435 04/22/24 0609  NA 135 135  K 4.2 4.1  CL 100 100  CO2 25 26  GLUCOSE 120* 108*  BUN  24* 17  CREATININE 1.21 1.10  CALCIUM  8.5* 8.2*   Liver Function Tests: Recent Labs  Lab 04/21/24 0435  AST 14*  ALT 9  ALKPHOS 65  BILITOT 0.3  PROT 6.9  ALBUMIN 3.1*   CBG: Recent Labs  Lab 04/21/24 2359 04/22/24 0417 04/22/24 0646 04/22/24 0908 04/22/24 1011  GLUCAP 104* 110* 114* 113* 112*    Discharge time spent: greater than 30 minutes.  Signed: Lorita Rosa, MD [facility] 04/22/2024

## 2024-04-22 NOTE — Plan of Care (Signed)

## 2024-04-22 NOTE — Progress Notes (Signed)
 HIPAA compliant message left on supervisor on duty VM. To return call to this RN for report. Awaiting call back to give.

## 2024-04-22 NOTE — Interval H&P Note (Signed)
 History and Physical Interval Note:  04/22/2024 8:04 AM  [name]  has presented today for surgery, with the diagnosis of upper GI bleeding.  The various methods of treatment have been discussed with the patient and family. After consideration of risks, benefits and other options for treatment, the patient has consented to  Procedure(s): EGD (ESOPHAGOGASTRODUODENOSCOPY) (N/A) as a surgical intervention.  The patient's history has been reviewed, patient examined, no change in status, stable for surgery.  I have reviewed the patient's chart and labs.  Questions were answered to the patient's satisfaction.     Loy Ruff

## 2024-04-22 NOTE — Anesthesia Postprocedure Evaluation (Signed)
 Anesthesia Post Note  Patient: [name]  Procedure(s) Performed: EGD (ESOPHAGOGASTRODUODENOSCOPY) BIOPSY, GI     Patient location during evaluation: PACU Anesthesia Type: General and MAC Level of consciousness: patient cooperative and awake and alert Pain management: pain level controlled Vital Signs Assessment: post-procedure vital signs reviewed and stable Respiratory status: spontaneous breathing Cardiovascular status: stable Anesthetic complications: no   No notable events documented.  Last Vitals:  Vitals:   04/22/24 1441 04/22/24 1511  BP: (!) 139/57 (!) 138/54  Pulse: 63 63  Resp: 16 18  Temp: 37.1 C 36.7 C  SpO2: 98% 99%    Last Pain:  Vitals:   04/22/24 1511  TempSrc: Oral  PainSc:                  Gorman Laughter

## 2024-04-22 NOTE — Interval H&P Note (Signed)
 History and Physical Interval Note:  04/22/2024 8:06 AM  [name]  has presented today for surgery, with the diagnosis of upper GI bleeding.  The various methods of treatment have been discussed with the patient and family. After consideration of risks, benefits and other options for treatment, the patient has consented to  Procedure(s): EGD (ESOPHAGOGASTRODUODENOSCOPY) (N/A) as a surgical intervention.  The patient's history has been reviewed, patient examined, no change in status, stable for surgery.  I have reviewed the patient's chart and labs.  Questions were answered to the patient's satisfaction.     Loy Ruff

## 2024-04-23 ENCOUNTER — Telehealth: Payer: Self-pay | Admitting: Pharmacy Technician

## 2024-04-23 ENCOUNTER — Encounter (HOSPITAL_COMMUNITY): Payer: Self-pay | Admitting: Internal Medicine

## 2024-04-23 ENCOUNTER — Telehealth: Payer: Self-pay | Admitting: Internal Medicine

## 2024-04-23 LAB — BPAM RBC
Blood Product Expiration Date: 202506112359
Blood Product Expiration Date: 202506132359
ISSUE DATE / TIME: 202505100907
ISSUE DATE / TIME: 202505111446
Unit Type and Rh: 9500
Unit Type and Rh: 9500

## 2024-04-23 LAB — TYPE AND SCREEN
ABO/RH(D): O NEG
Antibody Screen: NEGATIVE
Unit division: 0
Unit division: 0

## 2024-04-23 NOTE — Telephone Encounter (Signed)
 Auth Submission: NO AUTH NEEDED Site of care: Site of care: [facility] Payer: [employer] DUAL Medication & CPT/J Code(s) submitted: Feraheme (ferumoxytol) [id number] Route of submission (phone, fax, portal):  Phone # Fax # Auth type: Buy/Bill PB Units/visits requested: X2 DOSES Reference number: [id number] Approval from: 04/23/24 to 08/24/24

## 2024-04-23 NOTE — Telephone Encounter (Signed)
 Patient referred to infusion pharmacy team for ambulatory infusion of IV iron.  Insurance - [employer] Medicare  Dx code - D51.0/D50.9 IV Iron Therapy - Feraheme 510 mg IV x 2 Infusion appointments - Scheduling team will schedule patient as soon as possible.   Aayan Haskew D. Bilbo Carcamo, PharmD

## 2024-04-24 LAB — SURGICAL PATHOLOGY

## 2024-04-25 ENCOUNTER — Ambulatory Visit: Payer: Self-pay | Admitting: Internal Medicine

## 2024-04-26 ENCOUNTER — Encounter: Payer: Self-pay | Admitting: Dietician

## 2024-04-26 ENCOUNTER — Encounter: Attending: Family | Admitting: Dietician

## 2024-04-26 DIAGNOSIS — E1165 Type 2 diabetes mellitus with hyperglycemia: Secondary | ICD-10-CM | POA: Insufficient documentation

## 2024-04-26 NOTE — Patient Instructions (Signed)
 Goals Established by Patient:  Goal 1: aim to eat 1 non-starchy vegetable every day (see list below of ones we talked about to keep in your room).  Goal 2: include a protein source with all meals/snacks (even if just a protein shake with your cereal).  Goal 3: do chair exercises for 15 minutes daily.   When choosing canned vegetables choose low sodium and aim to drain off the water and/or rinse.   Ideas for non-starchy vegetables to keep in your room: Canned green beans, canned carrots, canned spinach, canned asparagus, canned collard greens, coleslaw, tomatoes, canned squash.

## 2024-04-26 NOTE — Progress Notes (Signed)
 Diabetes Self-Management Education  Visit Type: First/Initial  Appt. Start Time: 0915 Appt. End Time: 1030  04/26/2024  Mr. [name], identified by name and date of birth, is a 73 y.o. male with a diagnosis of Diabetes: Type 2.   ASSESSMENT  History includes: cancer, cataracts, type 2 diabetes, HLD, HTN,  Labs noted: 12/20/23 A1c 7.0% Medications include: glipizide , metformin   Supplements: vitamin b12, ferrous sulfate , MVI, vitamin C , vitamin d3.   Pt reports he wants to eat healthier. Pt reports at one point in his life he was 400 lb and did weight watchers and feels he learned some about healthy eating from that.   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 Pt reports often having swelling in legs.   Pt reports over 8 months he lost from 262 lb to 226 lb.   Pt reports he lives in an assisted living facility. Pt reports the dining hall cooks 3 meals per day, but he often does not go to the dining hall. Pt reports he has a mini fridge with freezer in his room, and has someone who picks up some groceries for him every few days. Pt reports he gets $352 a month through insurance for groceries.   Pt reports sometimes he will only eat cereal and not eat the rest of the day, while other times having a meal such as 2-3 chicken salad sandwiches on hamburger buns and a can of clam chowder.   Pt reports he uses sweet and low to sweeten his cereal.   Pt reports he has no teeth but states he can chew most foods.   Pt reports he does not like raw fruits besides strawberries and peaches. Pt reports enjoying some vegetables but not often consuming them.   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   Pt states prior to his wound on his left leg he was doing physical therapy and standing 30 times per day (3 sets of 10) but since the wound has developed he has not been able to.   There were no vitals taken for this visit. There is no height or weight on file  to calculate BMI.   Diabetes Self-Management Education - 04/26/24 0910       Visit Information   Visit Type First/Initial      Initial Visit   Diabetes Type Type 2    Date Diagnosed in 1990s    Are you currently following a meal plan? No    Are you taking your medications as prescribed? Yes      Health Coping   How would you rate your overall health? Good      Psychosocial Assessment   Patient Belief/Attitude about Diabetes Motivated to manage diabetes    What is the hardest part about your diabetes right now, causing you the most concern, or is the most worrisome to you about your diabetes?   Making healty food and beverage choices;Checking blood sugar    Self-care barriers None    Self-management support Doctor's office    Other persons present Patient    Patient Concerns Nutrition/Meal planning    Special Needs None    Preferred Learning Style No preference indicated    Learning Readiness Ready    How often do you need to have someone help you when you read instructions, pamphlets, or other written materials from your doctor or pharmacy? 1 - Never    What is the last grade  level you completed in school? grad school      Pre-Education Assessment   Patient understands the diabetes disease and treatment process. Needs Instruction    Patient understands incorporating nutritional management into lifestyle. Needs Instruction    Patient undertands incorporating physical activity into lifestyle. Needs Instruction    Patient understands using medications safely. Needs Instruction    Patient understands monitoring blood glucose, interpreting and using results Needs Instruction    Patient understands prevention, detection, and treatment of acute complications. Needs Instruction    Patient understands prevention, detection, and treatment of chronic complications. Needs Instruction    Patient understands how to develop strategies to address psychosocial issues. Needs Instruction     Patient understands how to develop strategies to promote health/change behavior. Needs Instruction      Complications   Last HgB A1C per patient/outside source 7 %    How often do you check your blood sugar? 1-2 times/day    Have you had a dilated eye exam in the past 12 months? Yes    Have you had a dental exam in the past 12 months? Yes    Are you checking your feet? Yes    How many days per week are you checking your feet? 5      Dietary Intake   Breakfast bowl of cereal or oatmeal or grits and fruit and piece of bread.    Snack (morning) none    Lunch none OR 2-3 chicken salad sandwiches on hamburger buns    Snack (afternoon) none    Dinner none OR rice/noodles and meat    Snack (evening) none    Beverage(s) 1 c juice daily      Activity / Exercise   Activity / Exercise Type Light (walking / raking leaves)    How many days per week do you exercise? 2    How many minutes per day do you exercise? 30    Total minutes per week of exercise 60      Patient Education   Previous Diabetes Education No    Disease Pathophysiology Factors that contribute to the development of diabetes;Explored patient's options for treatment of their diabetes    Healthy Eating Role of diet in the treatment of diabetes and the relationship between the three main macronutrients and blood glucose level    Being Active Identified with patient nutritional and/or medication changes necessary with exercise.;Helped patient identify appropriate exercises in relation to his/her diabetes, diabetes complications and other health issue.    Medications Reviewed patients medication for diabetes, action, purpose, timing of dose and side effects.    Chronic complications Relationship between chronic complications and blood glucose control;Identified and discussed with patient  current chronic complications    Diabetes Stress and Support Identified and addressed patients feelings and concerns about diabetes;Worked with patient  to identify barriers to care and solutions    Lifestyle and Health Coping Lifestyle issues that need to be addressed for better diabetes care      Individualized Goals (developed by patient)   Nutrition General guidelines for healthy choices and portions discussed    Physical Activity Exercise 5-7 days per week;15 minutes per day    Medications take my medication as prescribed    Monitoring  Test my blood glucose as discussed    Problem Solving Eating Pattern    Reducing Risk examine blood glucose patterns;do foot checks daily;treat hypoglycemia with 15 grams of carbs if blood glucose less than 70mg /dL    Health Coping  Ask for help with psychological, social, or emotional issues      Post-Education Assessment   Patient understands the diabetes disease and treatment process. Comprehends key points    Patient understands incorporating nutritional management into lifestyle. Comprehends key points    Patient undertands incorporating physical activity into lifestyle. Comprehends key points    Patient understands using medications safely. Comphrehends key points    Patient understands monitoring blood glucose, interpreting and using results Needs Review    Patient understands prevention, detection, and treatment of acute complications. Needs Review    Patient understands prevention, detection, and treatment of chronic complications. Needs Review    Patient understands how to develop strategies to address psychosocial issues. Comprehends key points    Patient understands how to develop strategies to promote health/change behavior. Comprehends key points      Outcomes   Expected Outcomes Demonstrated interest in learning. Expect positive outcomes    Future DMSE 2 months    Program Status Not Completed             Individualized Plan for Diabetes Self-Management Training:   Learning Objective:  Patient will have a greater understanding of diabetes self-management. Patient education plan  is to attend individual and/or group sessions per assessed needs and concerns.   Plan:   Patient Instructions  Goals Established by Patient:  Goal 1: aim to eat 1 non-starchy vegetable every day (see list below of ones we talked about to keep in your room).  Goal 2: include a protein source with all meals/snacks (even if just a protein shake with your cereal).  Goal 3: do chair exercises for 15 minutes daily.   When choosing canned vegetables choose low sodium and aim to drain off the water and/or rinse.   Ideas for non-starchy vegetables to keep in your room: Canned green beans, canned carrots, canned spinach, canned asparagus, canned collard greens, coleslaw, tomatoes, canned squash.   Expected Outcomes:  Demonstrated interest in learning. Expect positive outcomes  Education material provided: My Plate, Non-Starchy Vegetable List  If problems or questions, patient to contact team via:  Phone  Future DSME appointment: 2 months

## 2024-05-15 ENCOUNTER — Ambulatory Visit

## 2024-05-22 ENCOUNTER — Ambulatory Visit

## 2024-05-23 ENCOUNTER — Emergency Department (HOSPITAL_COMMUNITY)

## 2024-05-23 ENCOUNTER — Encounter (HOSPITAL_COMMUNITY): Payer: Self-pay

## 2024-05-23 ENCOUNTER — Other Ambulatory Visit: Payer: Self-pay

## 2024-05-23 ENCOUNTER — Inpatient Hospital Stay (HOSPITAL_COMMUNITY)
Admission: EM | Admit: 2024-05-23 | Discharge: 2024-05-29 | DRG: 603 | Disposition: A | Discharge status: Nursing Home (Includes ICF) | Source: Skilled Nursing Facility | Attending: Family Medicine | Admitting: Family Medicine

## 2024-05-23 ENCOUNTER — Ambulatory Visit

## 2024-05-23 VITALS — BP 135/63 | HR 69 | Temp 98.3°F | Resp 18 | Ht 75.5 in | Wt 227.0 lb

## 2024-05-23 DIAGNOSIS — Z7984 Long term (current) use of oral hypoglycemic drugs: Secondary | ICD-10-CM

## 2024-05-23 DIAGNOSIS — I5032 Chronic diastolic (congestive) heart failure: Secondary | ICD-10-CM | POA: Diagnosis present

## 2024-05-23 DIAGNOSIS — E785 Hyperlipidemia, unspecified: Secondary | ICD-10-CM | POA: Diagnosis present

## 2024-05-23 DIAGNOSIS — Z79899 Other long term (current) drug therapy: Secondary | ICD-10-CM

## 2024-05-23 DIAGNOSIS — Z808 Family history of malignant neoplasm of other organs or systems: Secondary | ICD-10-CM

## 2024-05-23 DIAGNOSIS — Z8616 Personal history of COVID-19: Secondary | ICD-10-CM

## 2024-05-23 DIAGNOSIS — Z9049 Acquired absence of other specified parts of digestive tract: Secondary | ICD-10-CM

## 2024-05-23 DIAGNOSIS — N401 Enlarged prostate with lower urinary tract symptoms: Secondary | ICD-10-CM | POA: Diagnosis present

## 2024-05-23 DIAGNOSIS — E66812 Obesity, class 2: Secondary | ICD-10-CM | POA: Diagnosis present

## 2024-05-23 DIAGNOSIS — Z87891 Personal history of nicotine dependence: Secondary | ICD-10-CM

## 2024-05-23 DIAGNOSIS — Z85038 Personal history of other malignant neoplasm of large intestine: Secondary | ICD-10-CM

## 2024-05-23 DIAGNOSIS — Z23 Encounter for immunization: Secondary | ICD-10-CM

## 2024-05-23 DIAGNOSIS — Z89612 Acquired absence of left leg above knee: Secondary | ICD-10-CM

## 2024-05-23 DIAGNOSIS — N179 Acute kidney failure, unspecified: Secondary | ICD-10-CM | POA: Diagnosis present

## 2024-05-23 DIAGNOSIS — I110 Hypertensive heart disease with heart failure: Secondary | ICD-10-CM | POA: Diagnosis present

## 2024-05-23 DIAGNOSIS — Z87442 Personal history of urinary calculi: Secondary | ICD-10-CM

## 2024-05-23 DIAGNOSIS — I890 Lymphedema, not elsewhere classified: Secondary | ICD-10-CM | POA: Diagnosis present

## 2024-05-23 DIAGNOSIS — D519 Vitamin B12 deficiency anemia, unspecified: Secondary | ICD-10-CM | POA: Diagnosis not present

## 2024-05-23 DIAGNOSIS — L03115 Cellulitis of right lower limb: Principal | ICD-10-CM | POA: Diagnosis present

## 2024-05-23 DIAGNOSIS — D509 Iron deficiency anemia, unspecified: Secondary | ICD-10-CM

## 2024-05-23 DIAGNOSIS — Z6839 Body mass index (BMI) 39.0-39.9, adult: Secondary | ICD-10-CM

## 2024-05-23 DIAGNOSIS — L0390 Cellulitis, unspecified: Secondary | ICD-10-CM | POA: Diagnosis not present

## 2024-05-23 DIAGNOSIS — I480 Paroxysmal atrial fibrillation: Secondary | ICD-10-CM | POA: Diagnosis present

## 2024-05-23 DIAGNOSIS — Z7901 Long term (current) use of anticoagulants: Secondary | ICD-10-CM

## 2024-05-23 DIAGNOSIS — E119 Type 2 diabetes mellitus without complications: Secondary | ICD-10-CM | POA: Diagnosis present

## 2024-05-23 DIAGNOSIS — Z8249 Family history of ischemic heart disease and other diseases of the circulatory system: Secondary | ICD-10-CM

## 2024-05-23 DIAGNOSIS — Z820 Family history of epilepsy and other diseases of the nervous system: Secondary | ICD-10-CM

## 2024-05-23 DIAGNOSIS — Z89512 Acquired absence of left leg below knee: Secondary | ICD-10-CM

## 2024-05-23 MED ORDER — SODIUM CHLORIDE 0.9 % IV SOLN
510.0000 mg | Freq: Once | INTRAVENOUS | Status: AC
  Administered 2024-05-23: 510 mg via INTRAVENOUS
  Filled 2024-05-23: qty 17

## 2024-05-23 MED ORDER — SODIUM CHLORIDE 0.9 % IV SOLN
2.0000 g | Freq: Once | INTRAVENOUS | Status: AC
  Administered 2024-05-24: 2 g via INTRAVENOUS
  Filled 2024-05-23: qty 20

## 2024-05-23 NOTE — ED Triage Notes (Signed)
 Pt arrives EMS from [facility] with reports of chronic leg wounds to right lower leg. Pt reports he feels like they are starting to get infected. Pt is not currently taking abx.
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
## 2024-05-23 NOTE — Progress Notes (Signed)
 Diagnosis: Anemia due to vitamin B12 deficiency,   Provider:  Praveen Mannam MD  Procedure: IV Infusion  IV Type: Peripheral, IV Location: R Forearm  Feraheme (Ferumoxytol), Dose: 510 mg  Infusion Start Time: 1558  Infusion Stop Time: 1615  Post Infusion IV Care: 15 minutes Observation period completed and Peripheral IV Discontinued  Discharge: Condition: Good, Destination: Home . AVS Provided  Performed by:  Mathias Bogacki, RN

## 2024-05-24 DIAGNOSIS — L03115 Cellulitis of right lower limb: Secondary | ICD-10-CM | POA: Diagnosis present

## 2024-05-24 DIAGNOSIS — I110 Hypertensive heart disease with heart failure: Secondary | ICD-10-CM | POA: Diagnosis present

## 2024-05-24 DIAGNOSIS — L0390 Cellulitis, unspecified: Secondary | ICD-10-CM | POA: Diagnosis present

## 2024-05-24 DIAGNOSIS — N179 Acute kidney failure, unspecified: Secondary | ICD-10-CM | POA: Diagnosis present

## 2024-05-24 DIAGNOSIS — I480 Paroxysmal atrial fibrillation: Secondary | ICD-10-CM | POA: Diagnosis present

## 2024-05-24 DIAGNOSIS — Z23 Encounter for immunization: Secondary | ICD-10-CM | POA: Diagnosis present

## 2024-05-24 DIAGNOSIS — I5032 Chronic diastolic (congestive) heart failure: Secondary | ICD-10-CM | POA: Diagnosis present

## 2024-05-24 DIAGNOSIS — N401 Enlarged prostate with lower urinary tract symptoms: Secondary | ICD-10-CM | POA: Diagnosis present

## 2024-05-24 DIAGNOSIS — Z7984 Long term (current) use of oral hypoglycemic drugs: Secondary | ICD-10-CM | POA: Diagnosis not present

## 2024-05-24 DIAGNOSIS — Z9049 Acquired absence of other specified parts of digestive tract: Secondary | ICD-10-CM | POA: Diagnosis not present

## 2024-05-24 DIAGNOSIS — Z6839 Body mass index (BMI) 39.0-39.9, adult: Secondary | ICD-10-CM | POA: Diagnosis not present

## 2024-05-24 DIAGNOSIS — Z8616 Personal history of COVID-19: Secondary | ICD-10-CM | POA: Diagnosis not present

## 2024-05-24 DIAGNOSIS — E66812 Obesity, class 2: Secondary | ICD-10-CM | POA: Diagnosis present

## 2024-05-24 DIAGNOSIS — Z85038 Personal history of other malignant neoplasm of large intestine: Secondary | ICD-10-CM | POA: Diagnosis not present

## 2024-05-24 DIAGNOSIS — D509 Iron deficiency anemia, unspecified: Secondary | ICD-10-CM | POA: Diagnosis present

## 2024-05-24 DIAGNOSIS — I890 Lymphedema, not elsewhere classified: Secondary | ICD-10-CM | POA: Diagnosis present

## 2024-05-24 DIAGNOSIS — Z87891 Personal history of nicotine dependence: Secondary | ICD-10-CM | POA: Diagnosis not present

## 2024-05-24 DIAGNOSIS — Z7901 Long term (current) use of anticoagulants: Secondary | ICD-10-CM | POA: Diagnosis not present

## 2024-05-24 DIAGNOSIS — Z89612 Acquired absence of left leg above knee: Secondary | ICD-10-CM | POA: Diagnosis not present

## 2024-05-24 DIAGNOSIS — Z8249 Family history of ischemic heart disease and other diseases of the circulatory system: Secondary | ICD-10-CM | POA: Diagnosis not present

## 2024-05-24 DIAGNOSIS — Z89512 Acquired absence of left leg below knee: Secondary | ICD-10-CM | POA: Diagnosis not present

## 2024-05-24 DIAGNOSIS — E119 Type 2 diabetes mellitus without complications: Secondary | ICD-10-CM | POA: Diagnosis present

## 2024-05-24 DIAGNOSIS — E785 Hyperlipidemia, unspecified: Secondary | ICD-10-CM | POA: Diagnosis present

## 2024-05-24 DIAGNOSIS — Z79899 Other long term (current) drug therapy: Secondary | ICD-10-CM | POA: Diagnosis not present

## 2024-05-24 LAB — I-STAT CG4 LACTIC ACID, ED
Lactic Acid, Venous: 1.4 mmol/L (ref 0.5–1.9)
Lactic Acid, Venous: 1.9 mmol/L (ref 0.5–1.9)

## 2024-05-24 LAB — COMPREHENSIVE METABOLIC PANEL WITH GFR
ALT: 8 U/L (ref 0–44)
AST: 16 U/L (ref 15–41)
Albumin: 3.2 g/dL — ABNORMAL LOW (ref 3.5–5.0)
Alkaline Phosphatase: 65 U/L (ref 38–126)
Anion gap: 12 (ref 5–15)
BUN: 28 mg/dL — ABNORMAL HIGH (ref 8–23)
CO2: 24 mmol/L (ref 22–32)
Calcium: 8.6 mg/dL — ABNORMAL LOW (ref 8.9–10.3)
Chloride: 100 mmol/L (ref 98–111)
Creatinine, Ser: 1.3 mg/dL — ABNORMAL HIGH (ref 0.61–1.24)
GFR, Estimated: 58 mL/min — ABNORMAL LOW (ref >60)
Glucose, Bld: 120 mg/dL — ABNORMAL HIGH (ref 70–99)
Potassium: 3.9 mmol/L (ref 3.5–5.1)
Sodium: 136 mmol/L (ref 135–145)
Total Bilirubin: 0.7 mg/dL (ref 0.0–1.2)
Total Protein: 7.1 g/dL (ref 6.5–8.1)

## 2024-05-24 LAB — BASIC METABOLIC PANEL WITH GFR
Anion gap: 11 (ref 5–15)
BUN: 28 mg/dL — ABNORMAL HIGH (ref 8–23)
CO2: 27 mmol/L (ref 22–32)
Calcium: 8.8 mg/dL — ABNORMAL LOW (ref 8.9–10.3)
Chloride: 98 mmol/L (ref 98–111)
Creatinine, Ser: 1.52 mg/dL — ABNORMAL HIGH (ref 0.61–1.24)
GFR, Estimated: 48 mL/min — ABNORMAL LOW (ref >60)
Glucose, Bld: 168 mg/dL — ABNORMAL HIGH (ref 70–99)
Potassium: 3.9 mmol/L (ref 3.5–5.1)
Sodium: 136 mmol/L (ref 135–145)

## 2024-05-24 LAB — GLUCOSE, CAPILLARY
Glucose-Capillary: 123 mg/dL — ABNORMAL HIGH (ref 70–99)
Glucose-Capillary: 138 mg/dL — ABNORMAL HIGH (ref 70–99)
Glucose-Capillary: 133 mg/dL — ABNORMAL HIGH (ref 70–99)
Glucose-Capillary: 182 mg/dL — ABNORMAL HIGH (ref 70–99)

## 2024-05-24 LAB — CBC
HCT: 28.3 % — ABNORMAL LOW (ref 39.0–52.0)
HCT: 29 % — ABNORMAL LOW (ref 39.0–52.0)
Hemoglobin: 8.6 g/dL — ABNORMAL LOW (ref 13.0–17.0)
Hemoglobin: 9 g/dL — ABNORMAL LOW (ref 13.0–17.0)
MCH: 33.1 pg (ref 26.0–34.0)
MCH: 34 pg (ref 26.0–34.0)
MCHC: 30.4 g/dL (ref 30.0–36.0)
MCHC: 31 g/dL (ref 30.0–36.0)
MCV: 108.8 fL — ABNORMAL HIGH (ref 80.0–100.0)
MCV: 109.4 fL — ABNORMAL HIGH (ref 80.0–100.0)
Platelets: 170 10*3/uL (ref 150–400)
Platelets: 172 10*3/uL (ref 150–400)
RBC: 2.6 MIL/uL — ABNORMAL LOW (ref 4.22–5.81)
RBC: 2.65 MIL/uL — ABNORMAL LOW (ref 4.22–5.81)
RDW: 14.9 % (ref 11.5–15.5)
RDW: 15 % (ref 11.5–15.5)
WBC: 6 10*3/uL (ref 4.0–10.5)
WBC: 6.1 10*3/uL (ref 4.0–10.5)
nRBC: 0 % (ref 0.0–0.2)
nRBC: 0 % (ref 0.0–0.2)

## 2024-05-24 LAB — RETICULOCYTES
Immature Retic Fract: 8.6 % (ref 2.3–15.9)
RBC.: 2.44 MIL/uL — ABNORMAL LOW (ref 4.22–5.81)
Retic Count, Absolute: 34.2 10*3/uL (ref 19.0–186.0)
Retic Ct Pct: 1.4 % (ref 0.4–3.1)

## 2024-05-24 LAB — C-REACTIVE PROTEIN: CRP: 0.8 mg/dL (ref <1.0)

## 2024-05-24 LAB — TSH: TSH: 1.888 u[IU]/mL (ref 0.350–4.500)

## 2024-05-24 LAB — IRON AND TIBC
Iron: 309 ug/dL — ABNORMAL HIGH (ref 45–182)
Saturation Ratios: 98 % — ABNORMAL HIGH (ref 17.9–39.5)
TIBC: 316 ug/dL (ref 250–450)
UIBC: 7 ug/dL

## 2024-05-24 LAB — VITAMIN B12: Vitamin B-12: 261 pg/mL (ref 180–914)

## 2024-05-24 LAB — FERRITIN: Ferritin: 36 ng/mL (ref 24–336)

## 2024-05-24 LAB — LACTIC ACID, PLASMA
Lactic Acid, Venous: 1.6 mmol/L (ref 0.5–1.9)
Lactic Acid, Venous: 0.9 mmol/L (ref 0.5–1.9)

## 2024-05-24 LAB — SEDIMENTATION RATE: Sed Rate: 95 mm/h — ABNORMAL HIGH (ref 0–16)

## 2024-05-24 LAB — FOLATE: Folate: 19.4 ng/mL (ref >5.9)

## 2024-05-24 MED ORDER — ATORVASTATIN CALCIUM 20 MG PO TABS
20.0000 mg | ORAL_TABLET | Freq: Every evening | ORAL | Status: DC
  Administered 2024-05-24 – 2024-05-28 (×5): 20 mg via ORAL
  Filled 2024-05-24 (×5): qty 1

## 2024-05-24 MED ORDER — OXYCODONE HCL 5 MG PO TABS
5.0000 mg | ORAL_TABLET | ORAL | Status: DC | PRN
  Administered 2024-05-24 – 2024-05-28 (×4): 5 mg via ORAL
  Filled 2024-05-24 (×4): qty 1

## 2024-05-24 MED ORDER — SODIUM CHLORIDE 0.9 % IV SOLN
2.0000 g | Freq: Three times a day (TID) | INTRAVENOUS | Status: DC
  Administered 2024-05-24 – 2024-05-29 (×14): 2 g via INTRAVENOUS
  Filled 2024-05-24 (×15): qty 12.5

## 2024-05-24 MED ORDER — ONDANSETRON HCL 4 MG PO TABS
4.0000 mg | ORAL_TABLET | Freq: Four times a day (QID) | ORAL | Status: AC | PRN
Start: 2024-05-24 — End: ?

## 2024-05-24 MED ORDER — VITAMIN D 25 MCG (1000 UNIT) PO TABS
5000.0000 [IU] | ORAL_TABLET | Freq: Every day | ORAL | Status: DC
  Administered 2024-05-24 – 2024-05-29 (×6): 5000 [IU] via ORAL
  Filled 2024-05-24 (×6): qty 5

## 2024-05-24 MED ORDER — FENTANYL CITRATE PF 50 MCG/ML IJ SOSY
50.0000 ug | PREFILLED_SYRINGE | Freq: Once | INTRAMUSCULAR | Status: AC
  Administered 2024-05-24: 50 ug via INTRAVENOUS
  Filled 2024-05-24: qty 1

## 2024-05-24 MED ORDER — VANCOMYCIN HCL 1750 MG/350ML IV SOLN
1750.0000 mg | INTRAVENOUS | Status: DC
  Administered 2024-05-25: 1750 mg via INTRAVENOUS
  Filled 2024-05-24 (×2): qty 350

## 2024-05-24 MED ORDER — ACETAMINOPHEN 325 MG PO TABS
650.0000 mg | ORAL_TABLET | Freq: Four times a day (QID) | ORAL | Status: DC | PRN
  Administered 2024-05-28 – 2024-05-29 (×2): 650 mg via ORAL
  Filled 2024-05-24 (×2): qty 2

## 2024-05-24 MED ORDER — FERROUS SULFATE 325 (65 FE) MG PO TABS
325.0000 mg | ORAL_TABLET | Freq: Every day | ORAL | Status: DC
  Administered 2024-05-25 – 2024-05-29 (×5): 325 mg via ORAL
  Filled 2024-05-24 (×5): qty 1

## 2024-05-24 MED ORDER — SODIUM CHLORIDE 0.9 % IV BOLUS
1000.0000 mL | Freq: Once | INTRAVENOUS | Status: AC
  Administered 2024-05-24: 1000 mL via INTRAVENOUS

## 2024-05-24 MED ORDER — PNEUMOCOCCAL 20-VAL CONJ VACC 0.5 ML IM SUSY
0.5000 mL | PREFILLED_SYRINGE | INTRAMUSCULAR | Status: AC
  Administered 2024-05-25: 0.5 mL via INTRAMUSCULAR
  Filled 2024-05-24: qty 0.5

## 2024-05-24 MED ORDER — ONDANSETRON HCL 4 MG/2ML IJ SOLN: 4.0000 mg | Freq: Four times a day (QID) | INTRAMUSCULAR | Status: DC | PRN

## 2024-05-24 MED ORDER — TRAZODONE HCL 100 MG PO TABS
100.0000 mg | ORAL_TABLET | Freq: Every day | ORAL | Status: DC
  Administered 2024-05-24 – 2024-05-28 (×5): 100 mg via ORAL
  Filled 2024-05-24 (×5): qty 1

## 2024-05-24 MED ORDER — SODIUM CHLORIDE 0.9 % IV SOLN: INTRAVENOUS | Status: AC

## 2024-05-24 MED ORDER — INSULIN ASPART 100 UNIT/ML IJ SOLN
0.0000 [IU] | Freq: Three times a day (TID) | INTRAMUSCULAR | Status: DC
  Administered 2024-05-24: 1 [IU] via SUBCUTANEOUS
  Administered 2024-05-24: 2 [IU] via SUBCUTANEOUS
  Administered 2024-05-24: 1 [IU] via SUBCUTANEOUS
  Administered 2024-05-25: 2 [IU] via SUBCUTANEOUS
  Administered 2024-05-26 – 2024-05-27 (×3): 1 [IU] via SUBCUTANEOUS
  Administered 2024-05-27: 2 [IU] via SUBCUTANEOUS
  Administered 2024-05-27: 1 [IU] via SUBCUTANEOUS
  Administered 2024-05-28 (×3): 2 [IU] via SUBCUTANEOUS
  Administered 2024-05-29: 3 [IU] via SUBCUTANEOUS
  Administered 2024-05-29: 1 [IU] via SUBCUTANEOUS

## 2024-05-24 MED ORDER — VANCOMYCIN HCL IN DEXTROSE 1-5 GM/200ML-% IV SOLN: 1000.0000 mg | Freq: Once | INTRAVENOUS | Status: DC

## 2024-05-24 MED ORDER — ACETAMINOPHEN 650 MG RE SUPP: 650.0000 mg | Freq: Four times a day (QID) | RECTAL | Status: DC | PRN

## 2024-05-24 MED ORDER — VANCOMYCIN HCL 2000 MG/400ML IV SOLN
2000.0000 mg | Freq: Once | INTRAVENOUS | Status: AC
  Administered 2024-05-24: 2000 mg via INTRAVENOUS
  Filled 2024-05-24: qty 400

## 2024-05-24 MED ORDER — APIXABAN 5 MG PO TABS
5.0000 mg | ORAL_TABLET | Freq: Two times a day (BID) | ORAL | Status: DC
  Administered 2024-05-24 – 2024-05-29 (×10): 5 mg via ORAL
  Filled 2024-05-24 (×10): qty 1

## 2024-05-24 MED ORDER — ALBUTEROL SULFATE (2.5 MG/3ML) 0.083% IN NEBU: 2.5000 mg | INHALATION_SOLUTION | RESPIRATORY_TRACT | Status: DC | PRN

## 2024-05-24 MED ORDER — SODIUM CHLORIDE 0.9 % IV SOLN: 2.0000 g | Freq: Once | INTRAVENOUS | Status: DC

## 2024-05-24 MED ORDER — TAMSULOSIN HCL 0.4 MG PO CAPS
0.4000 mg | ORAL_CAPSULE | Freq: Every day | ORAL | Status: DC
  Administered 2024-05-24 – 2024-05-29 (×6): 0.4 mg via ORAL
  Filled 2024-05-24 (×6): qty 1

## 2024-05-24 MED ORDER — SODIUM CHLORIDE 0.9 % IV SOLN
2.0000 g | Freq: Two times a day (BID) | INTRAVENOUS | Status: DC
  Administered 2024-05-24: 2 g via INTRAVENOUS
  Filled 2024-05-24: qty 12.5

## 2024-05-24 NOTE — H&P (Addendum)
 History and Physical    [name]:[id number] DOB: 1951/02/20 DOA: 05/23/2024  PCP: Vevelyn Gowers, NP  Patient coming from: [facility]  I have personally briefly reviewed patient's old medical records in [facility] Link  Chief Complaint: wound infection lower extremity   HPI: SAID RUEB is a 73 y.o. male with medical history significant of  Atrial fibrillation on Eliquis , BPH, HLD, HTN ,obesiity,DMII,  left-sided AKA, iron deficiency anemia ,T1N0M0 cecal cancer s/p colectomy (2017) chronic lymphedema. Patient also has recent interim history of admission 5/10-5/11 for GI Bleed for which he underwent blood transfusion and EGD which noted no acute bleeding. Patient was restarted on Eliquis  per gi rec, of note cause of bleeding was not found,patient however s/p PRBC had stable h/h . Patient during that time was also noted to have skin tear on right lower extremity  in background of chronic lymphedema for which he on discharge he was referred to outpatient wound care. Patient it appears s/p discharge back to his facility ALF has not had wound care treatment as previously prescribed. Patient now returns with infected wound and right lower extremity cellulitis. Of note patient has prescription for twice a day dressing changes but it appears at his facility dressing changes completed 3 days instead due to staffing issues.  Patient states that over the past week  he has noted new wounds and right leg and has also note increase redness /swelling/drainage and pain. Due to this he presented to ED for evaluation. Patient notes no associated fever/chills. He also noted no chest pain / sob/ n/v/dysuria or diarrhea.   ED Course:  Temp 99.3, bp 144/53, hr 78, rr 18, sat 98%   Xray of right foot IMPRESSION: Diffuse soft tissue swelling in the lower leg similar to prior. No acute osseous abnormality.   Na 136, K 3.9, Glu 168, cr 1.52 ( 1.10) Wbc 6, hbg 9, MCV 109.4  Tx CTX, lactic, NS  1L, fenatnyl Review of Systems: As per HPI otherwise 10 point review of systems negative.   Past Medical History:  Diagnosis Date   Adenocarcinoma of colon [facility] 2)    Aortic root aneurysm    Atrial fibrillation (HCC)    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   Cataract    Constipation, chronic 11/09/2016   Diabetes mellitus without complication (HCC)    History of COVID-19 09/09/2022   History of homelessness 09/09/2022   History of kidney stones 09/09/2022   History of Stercoral ulcer of rectum 11/28/2016   Hyperlipidemia    Hypertension    Lichen planus    bilateral legs   Lymphedema    Nausea & vomiting 09/09/2022   Obesity (BMI 30-39.9) 09/09/2022   Personal history of cecal colon cancer 09/09/2022    Past Surgical History:  Procedure Laterality Date   AMPUTATION Left 11/03/2022   Procedure: BELOW KNEE  AMPUTATION, LEFT;  Surgeon: Timothy Ford, MD;  Location: [facility] OR;  Service: Orthopedics;  Laterality: Left;   APPENDECTOMY     APPLICATION OF WOUND VAC Left 11/03/2022   Procedure: APPLICATION OF WOUND VAC;  Surgeon: Timothy Ford, MD;  Location: [facility] OR;  Service: Orthopedics;  Laterality: Left;   BONE BIOPSY  04/22/2024   Procedure: BIOPSY, GI;  Surgeon: Kenney Peacemaker, MD;  Location: [facility];  Service: Gastroenterology;;   CHOLECYSTECTOMY N/A 09/11/2022   Procedure: LAPAROSCOPIC CHOLECYSTECTOMY;  Surgeon: Oza Blumenthal, MD;  Location: [facility]S;  Service: General;  Laterality: N/A;  COLON RESECTION N/A 11/22/2016   Procedure: HAND ASSISTED LAPAROSCOPIC COLON RESECTION;  Surgeon: Adalberto Hollow, MD;  Location: [facility];  Service: General;  Laterality: N/A;   COLONOSCOPY N/A 11/17/2016   Procedure: COLONOSCOPY;  Surgeon: Asencion Blacksmith, MD;  Location: [facility];  Service: Endoscopy;  Laterality: N/A;   COLONOSCOPY WITH PROPOFOL  N/A 08/29/2023   Procedure: COLONOSCOPY WITH PROPOFOL ;  Surgeon: Tobin Forts, MD;  Location: [facility];  Service:  Gastroenterology;  Laterality: N/A;   ESOPHAGOGASTRODUODENOSCOPY N/A 04/22/2024   Procedure: EGD (ESOPHAGOGASTRODUODENOSCOPY);  Surgeon: Kenney Peacemaker, MD;  Location: [facility];  Service: Gastroenterology;  Laterality: N/A;   FRACTURE SURGERY     MULTIPLE TOOTH EXTRACTIONS     RECTAL EXAM UNDER ANESTHESIA N/A 12/05/2016   Procedure: RECTAL EXAM UNDER ANESTHESIA, DISIMPACTION;  Surgeon: Juanita Norlander, MD;  Location: [facility];  Service: General;  Laterality: N/A;     reports that he quit smoking about 40 years ago. His smoking use included pipe. He has never used smokeless tobacco. He reports that he does not drink alcohol  and does not use drugs.  No Known Allergies  Family History  Problem Relation Age of Onset   Brain cancer Mother    Alzheimer's disease Father    Cancer Maternal Grandmother    Heart attack Maternal Grandfather    Pneumonia Paternal Grandfather    Colon cancer Neg Hx    Esophageal cancer Neg Hx    Stomach cancer Neg Hx     Prior to Admission medications   Medication Sig Start Date End Date Taking? Authorizing Provider  doxycycline (MONODOX) 100 MG capsule Take 100 mg by mouth 2 (two) times daily. 05/09/24  Yes [provider]  apixaban  (ELIQUIS ) 5 MG TABS tablet Take 1 tablet (5 mg total) by mouth 2 (two) times daily. 02/02/22   Walker, Caitlin S, NP  atorvastatin  (LIPITOR) 20 MG tablet Take 20 mg by mouth every evening. 02/02/21   [provider]  benzonatate (TESSALON) 100 MG capsule Take 100 mg by mouth 3 (three) times daily as needed for cough.    [provider]  cyanocobalamin  (VITAMIN B12) 1000 MCG tablet Take 1 tablet (1,000 mcg total) by mouth daily. 04/22/24   Lorita Rosa, MD  docusate sodium  (COLACE) 100 MG capsule Take 1 capsule (100 mg total) by mouth 2 (two) times daily. 12/08/16   Justina Oman, MD  ferrous sulfate  (FEROSUL) 325 (65 FE) MG tablet Take 325 mg by mouth daily.    [provider]  fluocinolone 0.01 %  cream Apply 1 Application topically in the morning and at bedtime. Apply  to lower right leg 2 times a day 05/04/23   [provider]  furosemide  (LASIX ) 80 MG tablet Take 80 mg by mouth daily. 08/26/22   [provider]  glipiZIDE  (GLUCOTROL  XL) 10 MG 24 hr tablet Take 10 mg by mouth daily.    [provider]  Glucagon (GVOKE HYPOPEN 1-PACK) 1 MG/0.2ML SOAJ Inject 1 mg into the skin as needed (for emergency of severe hypoglycemia).    [provider]  hydrocerin (EUCERIN) CREA Apply 1 Application topically 2 (two) times daily. 04/22/24   Lorita Rosa, MD  hydrOXYzine  (ATARAX /VISTARIL ) 25 MG tablet Take 1 tablet (25 mg total) by mouth every 8 (eight) hours as needed for itching. Use caution as this medication may make you drowsy Patient taking differently: Take 25 mg by mouth every 8 (eight) hours as needed for itching. 07/17/20   Little, Hebert Littler,  MD  metFORMIN  (GLUCOPHAGE -XR) 500 MG 24 hr tablet Take 1,000 mg by mouth in the morning and at bedtime.    [provider]  mupirocin  ointment (BACTROBAN ) 2 % Apply topically 2 (two) times daily. 04/22/24   Lorita Rosa, MD  potassium chloride  SA (KLOR-CON  M) 20 MEQ tablet Take 20 mEq by mouth daily. 08/26/22   [provider]  tamsulosin  (FLOMAX ) 0.4 MG CAPS capsule Take 1 capsule (0.4 mg total) by mouth daily. Patient taking differently: Take 0.4 mg by mouth every evening. 11/27/16   Justina Oman, MD  traZODone  (DESYREL ) 100 MG tablet Take 100 mg by mouth at bedtime.    [provider]    Physical Exam: Vitals:   05/23/24 2315 05/23/24 2330 05/23/24 2345 05/24/24 0000  BP: (!) 132/58 (!) 148/67 (!) 145/63 (!) 127/54  Pulse: 69 81 68 73  Resp:      Temp:      TempSrc:      SpO2: 98% 94% 97% 100%    Constitutional: NAD, calm, comfortable Vitals:   05/23/24 2315 05/23/24 2330 05/23/24 2345 05/24/24 0000  BP: (!) 132/58 (!) 148/67 (!) 145/63 (!) 127/54  Pulse: 69 81 68 73   Resp:      Temp:      TempSrc:      SpO2: 98% 94% 97% 100%   Eyes: PERRL, lids and conjunctivae normal ENMT: Mucous membranes are moist. Posterior pharynx clear of any exudate or lesions.Normal dentition.  Neck: normal, supple, no masses, no thyromegaly Respiratory: clear to auscultation bilaterally, no wheezing, no crackles. Normal respiratory effort. No accessory muscle use.  Cardiovascular: Regular rate and rhythm, no murmurs / rubs / gallops.  + extremity edema, extremities warm Abdomen:obese, no tenderness, no masses palpated. No hepatosplenomegaly. Bowel sounds positive.  Musculoskeletal: no clubbing / cyanosis. S/p left AKA, Good ROM, no contractures. Normal muscle tone.  Skin: noted chronic lymphedema changes, right lower extremity noted to be warm and tender are blister rupture and noted area of crusting Neurologic: CN grossly intact. Sensation intact,  MAE x4 Psychiatric: Normal judgment and insight. Alert and oriented x 3. Normal mood.    Labs on Admission: I have personally reviewed following labs and imaging studies  CBC: Recent Labs  Lab 05/24/24 0006  WBC 6.0  HGB 9.0*  HCT 29.0*  MCV 109.4*  PLT 170   Basic Metabolic Panel: Recent Labs  Lab 05/24/24 0006  NA 136  K 3.9  CL 98  CO2 27  GLUCOSE 168*  BUN 28*  CREATININE 1.52*  CALCIUM  8.8*   GFR: Estimated Creatinine Clearance: 56.7 mL/min (A) (by C-G formula based on SCr of 1.52 mg/dL (H)). Liver Function Tests: No results for input(s): AST, ALT, ALKPHOS, BILITOT, PROT, ALBUMIN in the last 168 hours. No results for input(s): LIPASE, AMYLASE in the last 168 hours. No results for input(s): AMMONIA in the last 168 hours. Coagulation Profile: No results for input(s): INR, PROTIME in the last 168 hours. Cardiac Enzymes: No results for input(s): CKTOTAL, CKMB, CKMBINDEX, TROPONINI in the last 168 hours. BNP (last 3 results) No results for input(s): PROBNP in the last  8760 hours. HbA1C: No results for input(s): HGBA1C in the last 72 hours. CBG: No results for input(s): GLUCAP in the last 168 hours. Lipid Profile: No results for input(s): CHOL, HDL, LDLCALC, TRIG, CHOLHDL, LDLDIRECT in the last 72 hours. Thyroid  Function Tests: No results for input(s): TSH, T4TOTAL, FREET4, T3FREE, THYROIDAB in the last 72 hours. Anemia Panel: No results  for input(s): VITAMINB12, FOLATE, FERRITIN, TIBC, IRON, RETICCTPCT in the last 72 hours. Urine analysis:    Component Value Date/Time   COLORURINE YELLOW 04/21/2024 0432   APPEARANCEUR CLEAR 04/21/2024 0432   LABSPEC 1.019 04/21/2024 0432   PHURINE 5.0 04/21/2024 0432   GLUCOSEU 50 (A) 04/21/2024 0432   HGBUR NEGATIVE 04/21/2024 0432   BILIRUBINUR NEGATIVE 04/21/2024 0432   BILIRUBINUR NEG 05/27/2013 1516   KETONESUR NEGATIVE 04/21/2024 0432   PROTEINUR NEGATIVE 04/21/2024 0432   UROBILINOGEN 0.2 05/27/2013 1516   NITRITE NEGATIVE 04/21/2024 0432   LEUKOCYTESUR NEGATIVE 04/21/2024 0432    Radiological Exams on Admission: DG Tibia/Fibula Right Result Date: 05/24/2024 CLINICAL DATA:  Cellulitis, swelling, erythema EXAM: RIGHT TIBIA AND FIBULA - 2 VIEW COMPARISON:  Radiographs 12/20/2023 FINDINGS: No acute fracture or dislocation. Moderate joint space height loss in the medial compartment. Chronic fracture fragment about the lateral femoral condyle. Diffuse soft tissue swelling. No evidence of osteomyelitis. Vascular calcifications. IMPRESSION: Diffuse soft tissue swelling in the lower leg similar to prior. No acute osseous abnormality. Electronically Signed   By: Rozell Cornet M.D.   On: 05/24/2024 00:06    EKG: Independently reviewed. N/a  Assessment/Plan  Recurrent Right Lower extremity cellulitis in setting of Chronic Wound and Lymphedema  -patient current facility getting wound care 2-3 days , however patient required bid dressing changes. Patient now since last  discharge one month ago now returns with recurrent cellulitis -admit to med surg -start on vanc/ctx/metronidazole  de-escalate as able -wound care consult  -supportive care with pain medications     AKI  -hold nephrotoxic medication  - gently ivfs   History of paroxysmal atrial fibrillation  -continue Eliquis  -not on rate control medication , hr stable  Anemia Recent Hx of Gi bleed -h/h currently stable on Eliquis   -monitor h/h -Macrocytic -anemia panel pending   Hypertension -patient note on medication  -patient denies history of hypertension   DM -Last A1c was 7  -iss/fs  -hold oral hypoglycemic for now     Chronic heart failure with preserved EF -compensated -hold  home Lasix  due to aki , restart once cr at baseline   Hyperlipidemia -continue Lipitor   BPH -continue tamsulosin   S/P left-sided AKA  Hx of T1N0M0 cecal cancer - s/p colectomy (2017)   DVT prophylaxis: Eliquis  Code Status: full/ as discussed per patient wishes in event of cardiac arrest  Family Communication: none at bedside Disposition Plan: patient  expected to be admitted greater than 2 midnights  Consults called:n/a Admission status: Med tele   Sabas Cradle MD [facility]   If 7PM-7AM, please contact night-coverage [email or web address] Password [email or web address]  05/24/2024, 2:06 AM

## 2024-05-24 NOTE — Plan of Care (Signed)
  Problem: Pain Managment: Goal: General experience of comfort will improve and/or be controlled Outcome: Progressing   Problem: Safety: Goal: Ability to remain free from injury will improve Outcome: Progressing   Problem: Nutrition: Goal: Adequate nutrition will be maintained Outcome: Progressing

## 2024-05-24 NOTE — Plan of Care (Signed)
   Problem: Activity: Goal: Risk for activity intolerance will decrease Outcome: Progressing   Problem: Pain Managment: Goal: General experience of comfort will improve and/or be controlled Outcome: Progressing   Problem: Safety: Goal: Ability to remain free from injury will improve Outcome: Progressing

## 2024-05-24 NOTE — Progress Notes (Signed)
 Same-day note 73 year old male with history of recurrent right lower extremity cellulitis lives in an assisted living facility admitted with another episode of right lower extremity cellulitis.  He reports he does not get enough care at the assisted living place.  He is supposed to wrap it daily but he does not often get it done due to lack of staff. Last ABI in August 2024 right lower extremity noncompressible right right lower extremity arteries.  Right toe brachial index was normal. Noted admission for cellulitis GI bleed. He is status post left below-knee amputation Dr. Julio Ohm Will continue Rocephin  Flagyl  and vancomycin  for now Check ABI right leg

## 2024-05-24 NOTE — Consult Note (Signed)
 WOC Nurse Consult Note: patient with known lymphedema, has been seen at [facility] last visit 01/2024 Reason for Consult R leg cellulitis  Wound type: full thickness r/t venous insufficiency  Pressure Injury POA: NA, not related to pressure  Measurement: scattered areas of full thickness R lower leg  Wound bed:  appear mostly dry brown  Drainage (amount, consistency, odor) see nursing flowsheet  Periwound: hyperkeratotic skin  Dressing procedure/placement/frequency:  Cleanse R lower leg (intact skin and ulcerations) with Vashe wound cleanser Timm Foot [phone]) do not rinse and allow to air dry.  Apply Xeroform gauze Timm Foot [phone]) to anterior and posterior legs, cover with ABD pad and secure with Kerlix roll gauze beginning just above toes and ending right below knees.  May apply Ace bandage wrapped in same fashion as Kerlix for light compression.    Patient should resume care with wound care center at discharge for ongoing management.    POC discussed with bedside nurse. WOC team will not follow. Re-consult if further needs arise.   Thank you,    Ronni Colace MSN, RN-BC, [employer]

## 2024-05-24 NOTE — Progress Notes (Signed)
 Pharmacy Antibiotic Note  [name] is a 73 y.o. male admitted on 05/23/2024 with cellulitis of right lower extremity .  Pharmacy has been consulted for vancomycin  dosing.  Plan: Vancomycin  2gm IV x 1 then 1750mg  q24h (AUC 492.4, Scr 1.52) Follow renal function, cultures and clinical course    Temp (24hrs), Avg:98.7 F (37.1 C), Min:98.3 F (36.8 C), Max:99.3 F (37.4 C)  Recent Labs  Lab 05/24/24 0006 05/24/24 0015 05/24/24 0229  WBC 6.0  --   --   CREATININE 1.52*  --   --   LATICACIDVEN  --  1.9 1.4    Estimated Creatinine Clearance: 56.7 mL/min (A) (by C-G formula based on SCr of 1.52 mg/dL (H)).    No Known Allergies  Antimicrobials this admission: 6/12 CTX x1 6/12 cefepime  >> 6/12 Vanc >>  Dose adjustments this admission:   Microbiology results: 6/12 BCx:   Thank you for allowing pharmacy to be a part of this patient's care.  Beau Bound RPh 05/24/2024, 3:54 AM

## 2024-05-24 NOTE — TOC Initial Note (Signed)
 Transition of Care [facility]) - Initial/Assessment Note    Patient Details  Name: [name] MRN: [id number] Date of Birth: [birth year]  [facility] CM/SW Contact:    Delilah Fend, LCSW Phone Number: 05/24/2024, 3:27 PM  Clinical Narrative:                  Met with pt today to review anticipated dc planning needs.  Pt very pleasant and talkative.  Reports that he has been a resident at [facility] since 2017 and fully anticipates returning when medically cleared.  He does have concerns about continued wound care needs for his leg and notes inconsistent care at facility as well as [facility] services starting and stopping in the past.  He notes very good follow up with his primary MD and hopeful to have HH again at time of this discharge.  He is very clear that he plans to return to [facility] and no desire to change facility - simply hopeful he might be able to get some additional HHRN visits to supplement what onsite staff is able to provide.  Pt has no HH agency preference - able to secure coverage with [facility] and information placed on AVS. TOC will continue to follow.    Expected Discharge Plan: Assisted Living ([facility]) Barriers to Discharge: Continued Medical Work up   Patient Goals and CMS Choice Patient states their goals for this hospitalization and ongoing recovery are:: return to [facility]          Expected Discharge Plan and Services In-house Referral: Clinical Social Work     Living arrangements for the past 2 months: Assisted Living Facility                 DME Arranged: N/A DME Agency: NA       HH Arranged: Charity fundraiser, PT HH Agency: [facility] Date HH Agency Contacted: 05/24/24 Time HH Agency Contacted: 1526 Representative spoke with at [facility] Agency: Loetta Ringer  Prior Living Arrangements/Services Living arrangements for the past 2 months: Assisted Living Facility Lives with:: Facility Resident Patient language and need for  interpreter reviewed:: Yes Do you feel safe going back to the place where you live?: Yes      Need for Family Participation in Patient Care: No (Comment) Care giver support system in place?: Yes (comment)   Criminal Activity/Legal Involvement Pertinent to Current Situation/Hospitalization: No - Comment as needed  Activities of Daily Living   ADL Screening (condition at time of admission) Independently performs ADLs?: Yes (appropriate for developmental age) Is the patient deaf or have difficulty hearing?: No Does the patient have difficulty seeing, even when wearing glasses/contacts?: No Does the patient have difficulty concentrating, remembering, or making decisions?: No  Permission Sought/Granted Permission sought to share information with : Facility Industrial/product designer granted to share information with : Yes, Verbal Permission Granted     Permission granted to share info w AGENCY: [facility]        Emotional Assessment Appearance:: Appears stated age Attitude/Demeanor/Rapport: Gracious, Engaged Affect (typically observed): Accepting, Pleasant Orientation: : Oriented to Self, Oriented to Place, Oriented to  Time, Oriented to Situation Alcohol  / Substance Use: Not Applicable Psych Involvement: No (comment)  Admission diagnosis:  Cellulitis [L03.90] Cellulitis of right lower extremity [L03.115] Patient Active Problem List   Diagnosis Date Noted   Heme + stool 04/22/2024   Anemia due to vitamin B12 deficiency 04/22/2024   Blood loss anemia 04/21/2024   Gastrointestinal  hemorrhage 04/21/2024   Aortic root dilation (HCC) 02/17/2024   Lymphedema 10/12/2023   Cellulitis 10/11/2023   Osteomyelitis of left foot (HCC) 10/30/2022   Cellulitis of left foot 10/27/2022   IDA (iron deficiency anemia) 09/10/2022   Chronic diastolic CHF (congestive heart failure) (HCC) 09/10/2022   CKD (chronic kidney disease) stage 2, GFR 60-89 ml/min 09/09/2022   Chronic  anticoagulation 09/09/2022   History of colon cancer 09/09/2022   Anemia of chronic disease 09/09/2022   Venous stasis ulcers- BLE 09/09/2022   Acute cholecystitis due to biliary calculus 09/09/2022   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   Subacute eczematous & allergic contact dermatitis  09/09/2022   History of COVID-19 09/09/2022   History of homelessness 09/09/2022   Diverticulosis of colon 09/09/2022   Cholelithiasis 09/09/2022   Hepatic steatosis 09/09/2022   Living in assisted living 09/09/2022   Pain in right foot 05/06/2022   Atrial fibrillation with slow ventricular response (HCC) 12/23/2021   Pain of joint of left ankle and foot 02/26/2021   BPH (benign prostatic hyperplasia) 11/30/2016   Abnormal CT scan, colon    AKI (acute kidney injury) (HCC)    Generalized weakness    Abdominal pain    Moderate protein-calorie malnutrition (HCC) 11/10/2016   Constipation, chronic 11/09/2016   CKD (chronic kidney disease) stage 3, GFR 30-59 ml/min (HCC) 11/09/2016   Hyperbilirubinemia 11/09/2016   Hyponatremia 11/08/2016   RBBB 09/13/2013   Snoring 09/13/2013   Edema 09/07/2013   DM2 (diabetes mellitus, type 2) (HCC) 03/16/2013   Arthritis 03/16/2013   Essential hypertension, benign 02/23/2013   Morbid obesity (HCC) 02/23/2013   Paroxysmal atrial fibrillation (HCC) 02/23/2013   HLD (hyperlipidemia) 02/23/2013   PCP:  Vevelyn Gowers, NP Pharmacy:  No Pharmacies Listed    Social Drivers of Health (SDOH) Social History: SDOH Screenings   Food Insecurity: No Food Insecurity (05/24/2024)  Housing: Low Risk  (05/24/2024)  Transportation Needs: No Transportation Needs (05/24/2024)  Utilities: Not At Risk (05/24/2024)  Depression (PHQ2-9): Low Risk  (05/23/2024)  Social Connections: Moderately Isolated (05/24/2024)  Tobacco Use: Medium Risk (05/23/2024)   SDOH Interventions:     Readmission Risk Interventions    05/24/2024    3:23 PM 12/21/2023    11:59 AM 10/14/2023    2:54 PM  Readmission Risk Prevention Plan  Transportation Screening Complete Complete Complete  PCP or Specialist Appt within 5-7 Days  Complete Complete  PCP or Specialist Appt within 3-5 Days Complete    Home Care Screening  Complete Complete  Medication Review (RN CM)  Complete Complete  Social Work Consult for Recovery Care Planning/Counseling Complete    Palliative Care Screening Not Applicable    Medication Review Oceanographer) Complete

## 2024-05-24 NOTE — Progress Notes (Signed)
 PHARMACY NOTE:  ANTIMICROBIAL RENAL DOSAGE ADJUSTMENT  Current antimicrobial regimen includes a mismatch between antimicrobial dosage and estimated renal function.  As per policy approved by the Pharmacy & Therapeutics and Medical Executive Committees, the antimicrobial dosage will be adjusted accordingly.  Current antimicrobial dosage:  cefepime  2gm q12h  Indication: LE cellulitis/wound  Renal Function:  Estimated Creatinine Clearance: 77.7 mL/min (A) (by C-G formula based on SCr of 1.3 mg/dL (H)). []      On intermittent HD, scheduled: []      On CRRT    Antimicrobial dosage has been changed to:  cefepime  2gm q8h   Thank you for allowing pharmacy to be a part of this patient's care.  Spurgeon Dyer, [facility] 05/24/2024 7:29 AM

## 2024-05-25 DIAGNOSIS — L03115 Cellulitis of right lower limb: Secondary | ICD-10-CM | POA: Diagnosis not present

## 2024-05-25 LAB — GLUCOSE, CAPILLARY
Glucose-Capillary: 114 mg/dL — ABNORMAL HIGH (ref 70–99)
Glucose-Capillary: 173 mg/dL — ABNORMAL HIGH (ref 70–99)
Glucose-Capillary: 117 mg/dL — ABNORMAL HIGH (ref 70–99)
Glucose-Capillary: 163 mg/dL — ABNORMAL HIGH (ref 70–99)

## 2024-05-25 LAB — CREATININE, SERUM
Creatinine, Ser: 1.16 mg/dL (ref 0.61–1.24)
GFR, Estimated: >60 mL/min (ref >60)

## 2024-05-25 MED ORDER — HYDROCORTISONE 1 % EX CREA
TOPICAL_CREAM | Freq: Three times a day (TID) | CUTANEOUS | Status: DC | PRN
  Administered 2024-05-26 – 2024-05-29 (×3): 1 via TOPICAL
  Filled 2024-05-25: qty 28

## 2024-05-25 MED ORDER — VANCOMYCIN HCL IN DEXTROSE 1-5 GM/200ML-% IV SOLN
1000.0000 mg | Freq: Two times a day (BID) | INTRAVENOUS | Status: DC
  Administered 2024-05-26 – 2024-05-27 (×3): 1000 mg via INTRAVENOUS
  Filled 2024-05-25 (×3): qty 200

## 2024-05-25 NOTE — Plan of Care (Signed)
   Problem: Coping: Goal: Level of anxiety will decrease Outcome: Progressing   Problem: Pain Managment: Goal: General experience of comfort will improve and/or be controlled Outcome: Progressing   Problem: Safety: Goal: Ability to remain free from injury will improve Outcome: Progressing

## 2024-05-25 NOTE — Progress Notes (Signed)
 Pharmacy Antibiotic Note  [name] is a 73 y.o. male with hx chronic lymphedema and RLE wound who presented to the ED on 05/23/2024 with worsening of LE wound and concern for infection.  He is currently on vancomycin  and cefepime  for cellulitis/wound infection.  Today, 05/25/2024: - day #2 abx - Afeb, wbc wnl - scr trending down with 1.16 this morning (crcl~87) - all cultures have been negative thus far  Plan: - adjust vancomycin  dose to 1000 mg IV q12h for est AUC 461 - cefepime  2gm IV q8h  ______________________________________________  Height: 6' 3 (190.5 cm) Weight: (!) 143 kg (315 lb 4.1 oz) IBW/kg (Calculated) : 84.5  Temp (24hrs), Avg:98.3 F (36.8 C), Min:97.8 F (36.6 C), Max:98.9 F (37.2 C)  Recent Labs  Lab 05/24/24 0006 05/24/24 0015 05/24/24 0229 05/24/24 0404 05/24/24 0716 05/25/24 0841  WBC 6.0  --   --  6.1  --   --   CREATININE 1.52*  --   --  1.30*  --  1.16  LATICACIDVEN  --  1.9 1.4 1.6 0.9  --     Estimated Creatinine Clearance: 86.6 mL/min (by C-G formula based on SCr of 1.16 mg/dL).    No Known Allergies  Thank you for allowing pharmacy to be a part of this patient's care.  Spurgeon Dyer 05/25/2024 10:22 AM

## 2024-05-25 NOTE — Plan of Care (Signed)
 Problem: Clinical Measurements: Goal: Ability to maintain clinical measurements within normal limits will improve Outcome: Progressing   Problem: Pain Managment: Goal: General experience of comfort will improve and/or be controlled Outcome: Progressing   Problem: Safety: Goal: Ability to remain free from injury will improve Outcome: Progressing   Problem: Coping: Goal: Ability to adjust to condition or change in health will improve Outcome: Progressing   Ara Knee, RN 05/25/24 5:11 PM

## 2024-05-25 NOTE — Progress Notes (Signed)
 [name]

## 2024-05-25 NOTE — Plan of Care (Signed)
  Problem: Coping: Goal: Level of anxiety will decrease Outcome: Progressing   Problem: Pain Managment: Goal: General experience of comfort will improve and/or be controlled Outcome: Progressing   Problem: Safety: Goal: Ability to remain free from injury will improve Outcome: Progressing   Problem: Skin Integrity: Goal: Risk for impaired skin integrity will decrease Outcome: Progressing

## 2024-05-25 NOTE — Progress Notes (Signed)
 PROGRESS NOTE    [name]  [id number] DOB: 1951/08/08 DOA: 05/23/2024 PCP: Vevelyn Gowers, NP   Brief Narrative:  73 y.o. male with medical history significant of  Atrial fibrillation on Eliquis , BPH, HLD, HTN ,obesiity,DMII,  left-sided AKA, iron deficiency anemia ,T1N0M0 cecal cancer s/p colectomy (2017) chronic lymphedema. Patient also has recent interim history of admission 5/10-5/11 for GI Bleed for which he underwent blood transfusion and EGD which noted no acute bleeding. Patient was restarted on Eliquis  per gi rec, of note cause of bleeding was not found,patient however s/p PRBC had stable h/h . Patient during that time was also noted to have skin tear on right lower extremity  in background of chronic lymphedema for which he on discharge he was referred to outpatient wound care. Patient it appears s/p discharge back to his facility ALF has not had wound care treatment as previously prescribed. Patient now returns with infected wound and right lower extremity cellulitis. Of note patient has prescription for twice a day dressing changes but it appears at his facility dressing changes completed 3 days instead due to staffing issues.  Patient states that over the past week  he has noted new wounds and right leg and has also note increase redness /swelling/drainage and pain. Due to this he presented to ED for evaluation. Patient notes no associated fever/chills. He also noted no chest pain / sob/ n/v/dysuria or diarrhea.  Xray of right foot IMPRESSION: Diffuse soft tissue swelling in the lower leg similar to prior. No acute osseous abnormality. Assessment & Plan:   Principal Problem:   Cellulitis  Recurrent Right Lower extremity cellulitis in setting of Chronic Wound and Lymphedema -patient with recurrent admission for right lower extremity cellulitis.  He is status post left above-knee amputation.  He is supposed to have dressing changes and wound care daily but this has not been  happening where he is currently due to staffing issues.  He has been admitted with right lower extremity cellulitis.  Seen by wound care recommending cleaning the right lower leg with wound cleanser allowed to air dry and apply Xeroform gauze and covered with ABD pad and Kerlix.  Blood cultures are pending.  Prior blood cultures from early 2025 grew Staph hominis and epidermidis.  Currently on vancomycin  and cefepime .   AKI  -hold nephrotoxic medication monitor closely on vancomycin   History of paroxysmal atrial fibrillation  -continue Eliquis  -not on rate control medication , hr stable   Anemia recent history of GI bleed hemoglobin has been stable on Eliquis .    DM on metformin  and glipizide  prior to admission -Last A1c was 7  -iss/fs  CBG (last 3)  Recent Labs    05/24/24 1620 05/24/24 2104 05/25/24 0728  GLUCAP 133* 182* 114*   Chronic heart failure with preserved EF -will need to restart Lasix  at some point we will hold it for today  Hyperlipidemia -continue Lipitor   BPH -continue tamsulosin    S/P left-sided AKA   Hx of T1N0M0 cecal cancer - s/p colectomy (2017)   Estimated body mass index is 39.4 kg/m as calculated from the following:   Height as of this encounter: 6' 3 (1.905 m).   Weight as of this encounter: 143 kg.  DVT prophylaxis: Eliquis   code Status: Full code  Family Communication: None at bedside Disposition Plan:  Status is: Inpatient Remains inpatient appropriate because: Acute illness   Consultants:  None  Procedures: None Antimicrobials: Vanco mycin and cefepime   Subjective: Right leg feels better  than yesterday Ace wrapped yesterday trying to keep it elevated  Objective: Vitals:   05/24/24 2013 05/24/24 2300 05/25/24 0132 05/25/24 0537  BP: 123/63  (!) 117/55 115/60  Pulse: 64  61 61  Resp: 17  18 18   Temp: 98.5 F (36.9 C)  97.9 F (36.6 C) 97.8 F (36.6 C)  TempSrc: Oral     SpO2: 98%  96% 95%  Weight:  (!) 143 kg    Height:   6' 3 (1.905 m)      Intake/Output Summary (Last 24 hours) at 05/25/2024 1022 Last data filed at 05/24/2024 2200 Gross per 24 hour  Intake 920 ml  Output 700 ml  Net 220 ml   Filed Weights   05/24/24 0330 05/24/24 2300  Weight: (!) 143 kg (!) 143 kg    Examination:  General exam: Appears in no acute distress Respiratory system: Clear to auscultation. Respiratory effort normal. Cardiovascular system: S1 & S2 heard, RRR Gastrointestinal system: Abdomen is nondistended, soft and nontender. No organomegaly or masses felt. Normal bowel sounds heard. Central nervous system: Alert and oriented. No focal neurological deficits. Extremities: Left AKA right lower extremity covered with Ace bandage   Data Reviewed: I have personally reviewed following labs and imaging studies  CBC: Recent Labs  Lab 05/24/24 0006 05/24/24 0404  WBC 6.0 6.1  HGB 9.0* 8.6*  HCT 29.0* 28.3*  MCV 109.4* 108.8*  PLT 170 172   Basic Metabolic Panel: Recent Labs  Lab 05/24/24 0006 05/24/24 0404 05/25/24 0841  NA 136 136  --   K 3.9 3.9  --   CL 98 100  --   CO2 27 24  --   GLUCOSE 168* 120*  --   BUN 28* 28*  --   CREATININE 1.52* 1.30* 1.16  CALCIUM  8.8* 8.6*  --    GFR: Estimated Creatinine Clearance: 86.6 mL/min (by C-G formula based on SCr of 1.16 mg/dL). Liver Function Tests: Recent Labs  Lab 05/24/24 0404  AST 16  ALT 8  ALKPHOS 65  BILITOT 0.7  PROT 7.1  ALBUMIN 3.2*   No results for input(s): LIPASE, AMYLASE in the last 168 hours. No results for input(s): AMMONIA in the last 168 hours. Coagulation Profile: No results for input(s): INR, PROTIME in the last 168 hours. Cardiac Enzymes: No results for input(s): CKTOTAL, CKMB, CKMBINDEX, TROPONINI in the last 168 hours. BNP (last 3 results) No results for input(s): PROBNP in the last 8760 hours. HbA1C: No results for input(s): HGBA1C in the last 72 hours. CBG: Recent Labs  Lab 05/24/24 0814  05/24/24 1129 05/24/24 1620 05/24/24 2104 05/25/24 0728  GLUCAP 123* 138* 133* 182* 114*   Lipid Profile: No results for input(s): CHOL, HDL, LDLCALC, TRIG, CHOLHDL, LDLDIRECT in the last 72 hours. Thyroid  Function Tests: Recent Labs    05/24/24 0716  TSH 1.888   Anemia Panel: Recent Labs    05/24/24 0716  VITAMINB12 261  FOLATE 19.4  FERRITIN 36  TIBC 316  IRON 309*  RETICCTPCT 1.4   Sepsis Labs: Recent Labs  Lab 05/24/24 0015 05/24/24 0229 05/24/24 0404 05/24/24 0716  LATICACIDVEN 1.9 1.4 1.6 0.9    Recent Results (from the past 240 hours)  Blood culture (routine x 2)     Status: None (Preliminary result)   Collection Time: 05/24/24 12:06 AM   Specimen: BLOOD  Result Value Ref Range Status   Specimen Description   Final    BLOOD BLOOD LEFT WRIST Performed at [facility], [address], [place], [place] [address]    Special Requests   Final    BOTTLES DRAWN AEROBIC AND ANAEROBIC Blood Culture results may not be optimal due to an inadequate volume of blood received in culture bottles Performed at [facility], [address], [place], [place] [address]    Culture   Final    NO GROWTH 1 DAY Performed at [facility], [address] [place], [place] [address]    Report Status PENDING  Incomplete  Blood culture (routine x 2)     Status: None (Preliminary result)   Collection Time: 05/24/24 12:06 AM   Specimen: BLOOD RIGHT HAND  Result Value Ref Range Status   Specimen Description   Final    BLOOD RIGHT HAND Performed at [facility], [address], [place], [place] [address]    Special Requests   Final    BOTTLES DRAWN AEROBIC AND ANAEROBIC Blood Culture results may not be optimal due to an inadequate volume of blood received in culture bottles Performed at [facility], [address] [place], [place] [address]    Culture   Final    NO GROWTH 1 DAY Performed at [facility], [address], [place], [place] [address]    Report Status PENDING  Incomplete     Radiology Studies: DG Tibia/Fibula Right Result Date: 05/24/2024 CLINICAL DATA:  Cellulitis, swelling, erythema EXAM: RIGHT TIBIA AND FIBULA - 2 VIEW COMPARISON:  Radiographs 12/20/2023 FINDINGS: No acute fracture or dislocation. Moderate joint space height loss in the medial compartment. Chronic fracture fragment about the lateral femoral condyle. Diffuse soft tissue swelling. No evidence of osteomyelitis. Vascular calcifications. IMPRESSION: Diffuse soft tissue swelling in the lower leg similar to prior. No acute osseous abnormality. Electronically Signed   By: Rozell Cornet M.D.   On: 05/24/2024 00:06    Scheduled Meds:  apixaban   5 mg Oral BID   atorvastatin   20 mg Oral QPM   cholecalciferol   5,000 Units Oral Daily   ferrous sulfate   325 mg Oral Q breakfast   insulin  aspart  0-9 Units Subcutaneous TID WC   pneumococcal 20-valent conjugate vaccine  0.5 mL Intramuscular Tomorrow-1000   tamsulosin   0.4 mg Oral Daily   traZODone   100 mg Oral QHS   Continuous Infusions:  ceFEPime  (MAXIPIME ) IV 2 g (05/25/24 0402)   vancomycin  1,750 mg (05/25/24 0513)     LOS: 1 day    Time spent:  50 min  Barbee Lew, MD 05/25/2024, 10:22 AM

## 2024-05-26 DIAGNOSIS — L03115 Cellulitis of right lower limb: Secondary | ICD-10-CM | POA: Diagnosis not present

## 2024-05-26 LAB — GLUCOSE, CAPILLARY
Glucose-Capillary: 140 mg/dL — ABNORMAL HIGH (ref 70–99)
Glucose-Capillary: 148 mg/dL — ABNORMAL HIGH (ref 70–99)
Glucose-Capillary: 108 mg/dL — ABNORMAL HIGH (ref 70–99)
Glucose-Capillary: 206 mg/dL — ABNORMAL HIGH (ref 70–99)

## 2024-05-26 LAB — BASIC METABOLIC PANEL WITH GFR
Anion gap: 7 (ref 5–15)
BUN: 22 mg/dL (ref 8–23)
CO2: 24 mmol/L (ref 22–32)
Calcium: 8.3 mg/dL — ABNORMAL LOW (ref 8.9–10.3)
Chloride: 103 mmol/L (ref 98–111)
Creatinine, Ser: 1.19 mg/dL (ref 0.61–1.24)
GFR, Estimated: >60 mL/min (ref >60)
Glucose, Bld: 131 mg/dL — ABNORMAL HIGH (ref 70–99)
Potassium: 4 mmol/L (ref 3.5–5.1)
Sodium: 134 mmol/L — ABNORMAL LOW (ref 135–145)

## 2024-05-26 MED ORDER — POTASSIUM CHLORIDE CRYS ER 20 MEQ PO TBCR
20.0000 meq | EXTENDED_RELEASE_TABLET | Freq: Every day | ORAL | Status: DC
  Administered 2024-05-26 – 2024-05-29 (×4): 20 meq via ORAL
  Filled 2024-05-26 (×4): qty 1

## 2024-05-26 MED ORDER — FUROSEMIDE 10 MG/ML IJ SOLN
20.0000 mg | Freq: Every day | INTRAMUSCULAR | Status: DC
  Administered 2024-05-26 – 2024-05-28 (×3): 20 mg via INTRAVENOUS
  Filled 2024-05-26 (×3): qty 2

## 2024-05-26 MED ORDER — DOCUSATE SODIUM 100 MG PO CAPS
100.0000 mg | ORAL_CAPSULE | Freq: Two times a day (BID) | ORAL | Status: DC
  Administered 2024-05-26 – 2024-05-29 (×6): 100 mg via ORAL
  Filled 2024-05-26 (×6): qty 1

## 2024-05-26 NOTE — Progress Notes (Signed)
 Dressing change completed per order (Vashe, air dry, Xeroform to anterior and posterior, covered with ABD pads, wrapped from toes to below knee with Kerlix, wrapped with Ace wrap in same manner), skin flaky, pink/red, weepy, few areas of serous blistering mostly at the anterior ankle, largest wound on inner aspect of calf appears to be fully granulated, patient pre-medicated with oxycodone  per request prior to dressing change, tolerated well.

## 2024-05-26 NOTE — Progress Notes (Signed)
 PROGRESS NOTE    JENNER ROSIER  QIO:[id number] DOB: 1951-04-25 DOA: 05/23/2024 PCP: Vevelyn Gowers, NP   Brief Narrative:  73 y.o. male with medical history significant of  Atrial fibrillation on Eliquis , BPH, HLD, HTN ,obesiity,DMII,  left-sided AKA, iron deficiency anemia ,T1N0M0 cecal cancer s/p colectomy (2017) chronic lymphedema. Patient also has recent interim history of admission 5/10-5/11 for GI Bleed for which he underwent blood transfusion and EGD which noted no acute bleeding. Patient was restarted on Eliquis  per gi rec, of note cause of bleeding was not found,patient however s/p PRBC had stable h/h . Patient during that time was also noted to have skin tear on right lower extremity  in background of chronic lymphedema for which he on discharge he was referred to outpatient wound care. Patient it appears s/p discharge back to his facility ALF has not had wound care treatment as previously prescribed. Patient now returns with infected wound and right lower extremity cellulitis. Of note patient has prescription for twice a day dressing changes but it appears at his facility dressing changes completed 3 days instead due to staffing issues.  Patient states that over the past week  he has noted new wounds and right leg and has also note increase redness /swelling/drainage and pain. Due to this he presented to ED for evaluation. Patient notes no associated fever/chills. He also noted no chest pain / sob/ n/v/dysuria or diarrhea.  Xray of right foot IMPRESSION: Diffuse soft tissue swelling in the lower leg similar to prior. No acute osseous abnormality. Assessment & Plan:   Principal Problem:   Cellulitis  Recurrent Right Lower extremity cellulitis in setting of Chronic Wound and Lymphedema -patient with recurrent admission for right lower extremity cellulitis.  He is status post left above-knee amputation.  He is supposed to have dressing changes and wound care daily but this has not been  happening where he is currently due to staffing issues.  He has been admitted with right lower extremity cellulitis.  Seen by wound care recommending cleaning the right lower leg with wound cleanser allowed to air dry and apply Xeroform gauze and covered with ABD pad and Kerlix.  Blood cultures are pending.  Prior blood cultures from early 2025 grew Staph hominis and epidermidis.  Currently on vancomycin  and cefepime . Starting Lasix  20 mg daily he was on 80 mg daily prior to admission but his blood pressure has been soft to normal here we will start him at a low dose and monitor closely   AKI  -hold nephrotoxic medication monitor closely on vancomycin   History of paroxysmal atrial fibrillation on Eliquis  rate is controlled without any medications  Anemia recent history of GI bleed hemoglobin has been stable on Eliquis .    DM on metformin  and glipizide  prior to admission -Last A1c was 7  -iss/fs  CBG (last 3)  Recent Labs    05/25/24 2100 05/26/24 0754 05/26/24 1135  GLUCAP 163* 140* 148*   Chronic heart failure with preserved EF -will need to restart Lasix  at some point we will hold it for today  Hyperlipidemia -continue Lipitor   BPH -continue tamsulosin    S/P left-sided AKA   Hx of T1N0M0 cecal cancer - s/p colectomy (2017)   Estimated body mass index is 39.4 kg/m as calculated from the following:   Height as of this encounter: 6' 3 (1.905 m).   Weight as of this encounter: 143 kg.  DVT prophylaxis: Eliquis   code Status: Full code  Family Communication: None at bedside  Disposition Plan:  Status is: Inpatient Remains inpatient appropriate because: Acute illness   Consultants:  None  Procedures: None Antimicrobials: Vanco mycin and cefepime   Subjective:  Feels better Mid pictures noted from yesterday with decreasing edema primarily because he is in bed with the leg elevated  Objective: Vitals:   05/25/24 0537 05/25/24 1351 05/25/24 2058 05/26/24 0602  BP:  115/60 (!) 121/57 (!) 126/58 (!) 116/59  Pulse: 61 61 (!) 59 (!) 56  Resp: 18 16 18 17   Temp: 97.8 F (36.6 C) 98.8 F (37.1 C) 98.8 F (37.1 C) 98.2 F (36.8 C)  TempSrc:  Oral Oral Oral  SpO2: 95% 97% 93% 94%  Weight:      Height:        Intake/Output Summary (Last 24 hours) at 05/26/2024 1154 Last data filed at 05/26/2024 0602 Gross per 24 hour  Intake 1320 ml  Output 1550 ml  Net -230 ml   Filed Weights   05/24/24 0330 05/24/24 2300  Weight: (!) 143 kg (!) 143 kg    Examination:  General exam: Appears in no acute distress Respiratory system: Clear to auscultation. Respiratory effort normal. Cardiovascular system: S1 & S2 heard, RRR Gastrointestinal system: Abdomen is nondistended, soft and nontender. No organomegaly or masses felt. Normal bowel sounds heard. Central nervous system: Alert and oriented. No focal neurological deficits. Extremities: Left AKA right lower extremity covered with Ace bandage   Data Reviewed: I have personally reviewed following labs and imaging studies  CBC: Recent Labs  Lab 05/24/24 0006 05/24/24 0404  WBC 6.0 6.1  HGB 9.0* 8.6*  HCT 29.0* 28.3*  MCV 109.4* 108.8*  PLT 170 172   Basic Metabolic Panel: Recent Labs  Lab 05/24/24 0006 05/24/24 0404 05/25/24 0841 05/26/24 0333  NA 136 136  --  134*  K 3.9 3.9  --  4.0  CL 98 100  --  103  CO2 27 24  --  24  GLUCOSE 168* 120*  --  131*  BUN 28* 28*  --  22  CREATININE 1.52* 1.30* 1.16 1.19  CALCIUM  8.8* 8.6*  --  8.3*   GFR: Estimated Creatinine Clearance: 84.4 mL/min (by C-G formula based on SCr of 1.19 mg/dL). Liver Function Tests: Recent Labs  Lab 05/24/24 0404  AST 16  ALT 8  ALKPHOS 65  BILITOT 0.7  PROT 7.1  ALBUMIN 3.2*   No results for input(s): LIPASE, AMYLASE in the last 168 hours. No results for input(s): AMMONIA in the last 168 hours. Coagulation Profile: No results for input(s): INR, PROTIME in the last 168 hours. Cardiac Enzymes: No  results for input(s): CKTOTAL, CKMB, CKMBINDEX, TROPONINI in the last 168 hours. BNP (last 3 results) No results for input(s): PROBNP in the last 8760 hours. HbA1C: No results for input(s): HGBA1C in the last 72 hours. CBG: Recent Labs  Lab 05/25/24 1156 05/25/24 1717 05/25/24 2100 05/26/24 0754 05/26/24 1135  GLUCAP 173* 117* 163* 140* 148*   Lipid Profile: No results for input(s): CHOL, HDL, LDLCALC, TRIG, CHOLHDL, LDLDIRECT in the last 72 hours. Thyroid  Function Tests: Recent Labs    05/24/24 0716  TSH 1.888   Anemia Panel: Recent Labs    05/24/24 0716  VITAMINB12 261  FOLATE 19.4  FERRITIN 36  TIBC 316  IRON 309*  RETICCTPCT 1.4   Sepsis Labs: Recent Labs  Lab 05/24/24 0015 05/24/24 0229 05/24/24 0404 05/24/24 0716  LATICACIDVEN 1.9 1.4 1.6 0.9    Recent Results (from the past 240 hours)  Blood culture (routine x 2)     Status: None (Preliminary result)   Collection Time: 05/24/24 12:06 AM   Specimen: BLOOD  Result Value Ref Range Status   Specimen Description   Final    BLOOD BLOOD LEFT WRIST Performed at [facility], [address] [place], [place] [address]    Special Requests   Final    BOTTLES DRAWN AEROBIC AND ANAEROBIC Blood Culture results may not be optimal due to an inadequate volume of blood received in culture bottles Performed at [facility], [address] [place], [place] [address]    Culture   Final    NO GROWTH 2 DAYS Performed at [facility], [address], [place], [place] [address]    Report Status PENDING  Incomplete  Blood culture (routine x 2)     Status: None (Preliminary result)   Collection Time: 05/24/24 12:06 AM   Specimen: BLOOD RIGHT HAND  Result Value Ref Range Status   Specimen Description   Final    BLOOD RIGHT HAND Performed at [facility], [address] [place], [place] [address]    Special Requests   Final    BOTTLES DRAWN  AEROBIC AND ANAEROBIC Blood Culture results may not be optimal due to an inadequate volume of blood received in culture bottles Performed at [facility], [address] [place], [place] [address]    Culture   Final    NO GROWTH 2 DAYS Performed at [facility], [address], [place], [place] [address]    Report Status PENDING  Incomplete     Radiology Studies: No results found.   Scheduled Meds:  apixaban   5 mg Oral BID   atorvastatin   20 mg Oral QPM   cholecalciferol   5,000 Units Oral Daily   ferrous sulfate   325 mg Oral Q breakfast   insulin  aspart  0-9 Units Subcutaneous TID WC   tamsulosin   0.4 mg Oral Daily   traZODone   100 mg Oral QHS   Continuous Infusions:  ceFEPime  (MAXIPIME ) IV 2 g (05/26/24 0414)   vancomycin  1,000 mg (05/26/24 0448)     LOS: 2 days    Time spent:  30 min  Barbee Lew, MD 05/26/2024, 11:54 AM

## 2024-05-26 NOTE — Plan of Care (Signed)
 Problem: Clinical Measurements: Goal: Ability to maintain clinical measurements within normal limits will improve Outcome: Progressing   Problem: Activity: Goal: Risk for activity intolerance will decrease Outcome: Progressing   Problem: Coping: Goal: Level of anxiety will decrease Outcome: Progressing   Problem: Elimination: Goal: Will not experience complications related to bowel motility Outcome: Progressing   Problem: Safety: Goal: Ability to remain free from injury will improve Outcome: Progressing   Ara Knee, RN 05/26/24 5:52 PM

## 2024-05-26 NOTE — Plan of Care (Signed)
   Problem: Coping: Goal: Level of anxiety will decrease Outcome: Progressing   Problem: Pain Managment: Goal: General experience of comfort will improve and/or be controlled Outcome: Progressing   Problem: Safety: Goal: Ability to remain free from injury will improve Outcome: Progressing

## 2024-05-27 DIAGNOSIS — L03115 Cellulitis of right lower limb: Secondary | ICD-10-CM | POA: Diagnosis not present

## 2024-05-27 LAB — GLUCOSE, CAPILLARY
Glucose-Capillary: 144 mg/dL — ABNORMAL HIGH (ref 70–99)
Glucose-Capillary: 185 mg/dL — ABNORMAL HIGH (ref 70–99)
Glucose-Capillary: 137 mg/dL — ABNORMAL HIGH (ref 70–99)
Glucose-Capillary: 215 mg/dL — ABNORMAL HIGH (ref 70–99)

## 2024-05-27 LAB — BASIC METABOLIC PANEL WITH GFR
Anion gap: 8 (ref 5–15)
BUN: 22 mg/dL (ref 8–23)
CO2: 23 mmol/L (ref 22–32)
Calcium: 8.2 mg/dL — ABNORMAL LOW (ref 8.9–10.3)
Chloride: 104 mmol/L (ref 98–111)
Creatinine, Ser: 1.35 mg/dL — ABNORMAL HIGH (ref 0.61–1.24)
GFR, Estimated: 55 mL/min — ABNORMAL LOW (ref >60)
Glucose, Bld: 192 mg/dL — ABNORMAL HIGH (ref 70–99)
Potassium: 4 mmol/L (ref 3.5–5.1)
Sodium: 135 mmol/L (ref 135–145)

## 2024-05-27 MED ORDER — LINEZOLID 600 MG PO TABS
600.0000 mg | ORAL_TABLET | Freq: Two times a day (BID) | ORAL | Status: DC
  Administered 2024-05-27 – 2024-05-29 (×4): 600 mg via ORAL
  Filled 2024-05-27 (×5): qty 1

## 2024-05-27 NOTE — Plan of Care (Signed)
  Problem: Education: Goal: Knowledge of General Education information will improve Description: Including pain rating scale, medication(s)/side effects and non-pharmacologic comfort measures Outcome: Progressing   Problem: Health Behavior/Discharge Planning: Goal: Ability to manage health-related needs will improve Outcome: Progressing   Problem: Clinical Measurements: Goal: Ability to maintain clinical measurements within normal limits will improve Outcome: Progressing Goal: Will remain free from infection Outcome: Progressing Goal: Diagnostic test results will improve Outcome: Progressing Goal: Respiratory complications will improve Outcome: Progressing Goal: Cardiovascular complication will be avoided Outcome: Progressing   Problem: Activity: Goal: Risk for activity intolerance will decrease Outcome: Progressing   Problem: Nutrition: Goal: Adequate nutrition will be maintained Outcome: Adequate for Discharge   Problem: Coping: Goal: Level of anxiety will decrease Outcome: Progressing   Problem: Elimination: Goal: Will not experience complications related to bowel motility Outcome: Completed/Met Goal: Will not experience complications related to urinary retention Outcome: Completed/Met   Problem: Pain Managment: Goal: General experience of comfort will improve and/or be controlled Outcome: Progressing   Problem: Safety: Goal: Ability to remain free from injury will improve Outcome: Progressing   Problem: Skin Integrity: Goal: Risk for impaired skin integrity will decrease Outcome: Progressing   Problem: Clinical Measurements: Goal: Ability to avoid or minimize complications of infection will improve Outcome: Progressing   Problem: Skin Integrity: Goal: Skin integrity will improve Outcome: Progressing

## 2024-05-27 NOTE — Progress Notes (Signed)
 PROGRESS NOTE    [name]  WUJ:[id number] DOB: September 10, 1951 DOA: 05/23/2024 PCP: Vevelyn Gowers, NP   Brief Narrative:  73 y.o. male with medical history significant of  Atrial fibrillation on Eliquis , BPH, HLD, HTN ,obesiity,DMII,  left-sided AKA, iron deficiency anemia ,T1N0M0 cecal cancer s/p colectomy (2017) chronic lymphedema. Patient also has recent interim history of admission 5/10-5/11 for GI Bleed for which he underwent blood transfusion and EGD which noted no acute bleeding. Patient was restarted on Eliquis  per gi rec, of note cause of bleeding was not found,patient however s/p PRBC had stable h/h . Patient during that time was also noted to have skin tear on right lower extremity  in background of chronic lymphedema for which he on discharge he was referred to outpatient wound care. Patient it appears s/p discharge back to his facility ALF has not had wound care treatment as previously prescribed. Patient now returns with infected wound and right lower extremity cellulitis. Of note patient has prescription for twice a day dressing changes but it appears at his facility dressing changes completed 3 days instead due to staffing issues.  Patient states that over the past week  he has noted new wounds and right leg and has also note increase redness /swelling/drainage and pain. Due to this he presented to ED for evaluation. Patient notes no associated fever/chills. He also noted no chest pain / sob/ n/v/dysuria or diarrhea.  Xray of right foot IMPRESSION: Diffuse soft tissue swelling in the lower leg similar to prior. No acute osseous abnormality. Assessment & Plan:   Principal Problem:   Cellulitis  Recurrent Right Lower extremity cellulitis in setting of Chronic Wound and Lymphedema -patient with recurrent admission for right lower extremity cellulitis.  He is status post left above-knee amputation.  He is supposed to have dressing changes and wound care daily but this has not been  happening where he is currently due to staffing issues.  He has been admitted with right lower extremity cellulitis.  Seen by wound care recommending cleaning the right lower leg with wound cleanser allowed to air dry and apply Xeroform gauze and covered with ABD pad and Kerlix.  Blood cultures are pending.  Prior blood cultures from early 2025 grew Staph hominis and epidermidis.  Currently on vancomycin  and cefepime . Starting Lasix  20 mg daily he was on 80 mg daily prior to admission but his blood pressure has been soft to normal here we will start him at a low dose and monitor closely   AKI  -hold nephrotoxic medication monitor closely on vancomycin   History of paroxysmal atrial fibrillation on Eliquis  rate is controlled without any medications  Anemia recent history of GI bleed hemoglobin has been stable on Eliquis .    DM on metformin  and glipizide  prior to admission -Last A1c was 7  -iss/fs  CBG (last 3)  Recent Labs    05/26/24 1632 05/26/24 2136 05/27/24 0753  GLUCAP 108* 206* 144*   Chronic heart failure with preserved EF -will need to restart Lasix  at some point we will hold it for today  Hyperlipidemia -continue Lipitor   BPH -continue tamsulosin    S/P left-sided AKA   Hx of T1N0M0 cecal cancer - s/p colectomy (2017)   Estimated body mass index is 39.4 kg/m as calculated from the following:   Height as of this encounter: 6' 3 (1.905 m).   Weight as of this encounter: 143 kg.  DVT prophylaxis: Eliquis   code Status: Full code  Family Communication: None at bedside  Disposition Plan:  Status is: Inpatient Remains inpatient appropriate because: Acute illness   Consultants:  None  Procedures: None Antimicrobials: zyvox cefepime  Subjective:  Feels better Got lasix    Objective: Vitals:   05/26/24 0602 05/26/24 1601 05/26/24 2134 05/27/24 0548  BP: (!) 116/59 (!) 120/55 (!) 114/53 114/60  Pulse: (!) 56 67 (!) 59 (!) 58  Resp: 17 17 18 18   Temp: 98.2 F  (36.8 C) 98.2 F (36.8 C) 98.6 F (37 C) 97.8 F (36.6 C)  TempSrc: Oral Oral Oral Oral  SpO2: 94% 97% 98% 97%  Weight:      Height:        Intake/Output Summary (Last 24 hours) at 05/27/2024 1018 Last data filed at 05/27/2024 0859 Gross per 24 hour  Intake 1719.87 ml  Output 2775 ml  Net -1055.13 ml   Filed Weights   05/24/24 0330 05/24/24 2300  Weight: (!) 143 kg (!) 143 kg    Examination:  General exam: Appears in no acute distress Respiratory system: Clear to auscultation. Respiratory effort normal. Cardiovascular system: S1 & S2 heard, RRR Gastrointestinal system: Abdomen is nondistended, soft and nontender. No organomegaly or masses felt. Normal bowel sounds heard. Central nervous system: Alert and oriented. No focal neurological deficits. Extremities: Left AKA right lower extremity covered with Ace bandage Media pics with improvement in edema  as well as erythema.  Data Reviewed: I have personally reviewed following labs and imaging studies  CBC: Recent Labs  Lab 05/24/24 0006 05/24/24 0404  WBC 6.0 6.1  HGB 9.0* 8.6*  HCT 29.0* 28.3*  MCV 109.4* 108.8*  PLT 170 172   Basic Metabolic Panel: Recent Labs  Lab 05/24/24 0006 05/24/24 0404 05/25/24 0841 05/26/24 0333 05/27/24 0334  NA 136 136  --  134* 135  K 3.9 3.9  --  4.0 4.0  CL 98 100  --  103 104  CO2 27 24  --  24 23  GLUCOSE 168* 120*  --  131* 192*  BUN 28* 28*  --  22 22  CREATININE 1.52* 1.30* 1.16 1.19 1.35*  CALCIUM  8.8* 8.6*  --  8.3* 8.2*   GFR: Estimated Creatinine Clearance: 74.4 mL/min (A) (by C-G formula based on SCr of 1.35 mg/dL (H)). Liver Function Tests: Recent Labs  Lab 05/24/24 0404  AST 16  ALT 8  ALKPHOS 65  BILITOT 0.7  PROT 7.1  ALBUMIN 3.2*   No results for input(s): LIPASE, AMYLASE in the last 168 hours. No results for input(s): AMMONIA in the last 168 hours. Coagulation Profile: No results for input(s): INR, PROTIME in the last 168  hours. Cardiac Enzymes: No results for input(s): CKTOTAL, CKMB, CKMBINDEX, TROPONINI in the last 168 hours. BNP (last 3 results) No results for input(s): PROBNP in the last 8760 hours. HbA1C: No results for input(s): HGBA1C in the last 72 hours. CBG: Recent Labs  Lab 05/26/24 0754 05/26/24 1135 05/26/24 1632 05/26/24 2136 05/27/24 0753  GLUCAP 140* 148* 108* 206* 144*   Lipid Profile: No results for input(s): CHOL, HDL, LDLCALC, TRIG, CHOLHDL, LDLDIRECT in the last 72 hours. Thyroid  Function Tests: No results for input(s): TSH, T4TOTAL, FREET4, T3FREE, THYROIDAB in the last 72 hours.  Anemia Panel: No results for input(s): VITAMINB12, FOLATE, FERRITIN, TIBC, IRON, RETICCTPCT in the last 72 hours.  Sepsis Labs: Recent Labs  Lab 05/24/24 0015 05/24/24 0229 05/24/24 0404 05/24/24 0716  LATICACIDVEN 1.9 1.4 1.6 0.9    Recent Results (from the past 240 hours)  Blood culture (  routine x 2)     Status: None (Preliminary result)   Collection Time: 05/24/24 12:06 AM   Specimen: BLOOD  Result Value Ref Range Status   Specimen Description   Final    BLOOD BLOOD LEFT WRIST Performed at [facility], [address], [place], [place] [address]    Special Requests   Final    BOTTLES DRAWN AEROBIC AND ANAEROBIC Blood Culture results may not be optimal due to an inadequate volume of blood received in culture bottles Performed at [facility], [address] [place], [place] [address]    Culture   Final    NO GROWTH 3 DAYS Performed at [facility], [address], [place], [place] [address]    Report Status PENDING  Incomplete  Blood culture (routine x 2)     Status: None (Preliminary result)   Collection Time: 05/24/24 12:06 AM   Specimen: BLOOD RIGHT HAND  Result Value Ref Range Status   Specimen Description   Final    BLOOD RIGHT HAND Performed at [facility], [address] [place], [place] [address]    Special Requests   Final    BOTTLES DRAWN AEROBIC AND ANAEROBIC Blood Culture results may not be optimal due to an inadequate volume of blood received in culture bottles Performed at [facility] [address] [place], [place] [address]    Culture   Final    NO GROWTH 3 DAYS Performed at [facility], [address] [place], [place] [address]    Report Status PENDING  Incomplete     Radiology Studies: No results found.   Scheduled Meds:  apixaban   5 mg Oral BID   atorvastatin   20 mg Oral QPM   cholecalciferol   5,000 Units Oral Daily   docusate sodium   100 mg Oral BID   ferrous sulfate   325 mg Oral Q breakfast   furosemide   20 mg Intravenous Daily   insulin  aspart  0-9 Units Subcutaneous TID WC   potassium chloride   20 mEq Oral Daily   tamsulosin   0.4 mg Oral Daily   traZODone   100 mg Oral QHS   Continuous Infusions:  ceFEPime  (MAXIPIME ) IV 2 g (05/27/24 0336)   vancomycin  1,000 mg (05/27/24 0455)     LOS: 3 days    Time spent:  30 min  Barbee Lew, MD 05/27/2024, 10:18 AM

## 2024-05-27 NOTE — Plan of Care (Signed)

## 2024-05-28 ENCOUNTER — Ambulatory Visit (HOSPITAL_BASED_OUTPATIENT_CLINIC_OR_DEPARTMENT_OTHER): Payer: 59 | Admitting: Family

## 2024-05-28 ENCOUNTER — Inpatient Hospital Stay (HOSPITAL_COMMUNITY)

## 2024-05-28 DIAGNOSIS — N179 Acute kidney failure, unspecified: Secondary | ICD-10-CM | POA: Diagnosis not present

## 2024-05-28 DIAGNOSIS — L03115 Cellulitis of right lower limb: Secondary | ICD-10-CM | POA: Diagnosis not present

## 2024-05-28 LAB — CBC
HCT: 29.8 % — ABNORMAL LOW (ref 39.0–52.0)
Hemoglobin: 8.8 g/dL — ABNORMAL LOW (ref 13.0–17.0)
MCH: 33.6 pg (ref 26.0–34.0)
MCHC: 29.5 g/dL — ABNORMAL LOW (ref 30.0–36.0)
MCV: 113.7 fL — ABNORMAL HIGH (ref 80.0–100.0)
Platelets: 142 10*3/uL — ABNORMAL LOW (ref 150–400)
RBC: 2.62 MIL/uL — ABNORMAL LOW (ref 4.22–5.81)
RDW: 15.3 % (ref 11.5–15.5)
WBC: 4.7 10*3/uL (ref 4.0–10.5)
nRBC: 0 % (ref 0.0–0.2)

## 2024-05-28 LAB — COMPREHENSIVE METABOLIC PANEL WITH GFR
ALT: 11 U/L (ref 0–44)
AST: 14 U/L — ABNORMAL LOW (ref 15–41)
Albumin: 2.8 g/dL — ABNORMAL LOW (ref 3.5–5.0)
Alkaline Phosphatase: 57 U/L (ref 38–126)
Anion gap: 10 (ref 5–15)
BUN: 19 mg/dL (ref 8–23)
CO2: 22 mmol/L (ref 22–32)
Calcium: 8.2 mg/dL — ABNORMAL LOW (ref 8.9–10.3)
Chloride: 105 mmol/L (ref 98–111)
Creatinine, Ser: 1.23 mg/dL (ref 0.61–1.24)
GFR, Estimated: >60 mL/min (ref >60)
Glucose, Bld: 152 mg/dL — ABNORMAL HIGH (ref 70–99)
Potassium: 4.1 mmol/L (ref 3.5–5.1)
Sodium: 137 mmol/L (ref 135–145)
Total Bilirubin: 0.7 mg/dL (ref 0.0–1.2)
Total Protein: 6.7 g/dL (ref 6.5–8.1)

## 2024-05-28 LAB — GLUCOSE, CAPILLARY
Glucose-Capillary: 157 mg/dL — ABNORMAL HIGH (ref 70–99)
Glucose-Capillary: 166 mg/dL — ABNORMAL HIGH (ref 70–99)
Glucose-Capillary: 196 mg/dL — ABNORMAL HIGH (ref 70–99)
Glucose-Capillary: 151 mg/dL — ABNORMAL HIGH (ref 70–99)
Glucose-Capillary: 183 mg/dL — ABNORMAL HIGH (ref 70–99)

## 2024-05-28 MED ORDER — ACETAMINOPHEN 650 MG RE SUPP: 650.0000 mg | Freq: Four times a day (QID) | RECTAL | 0 refills | Status: AC | PRN

## 2024-05-28 MED ORDER — AMOXICILLIN-POT CLAVULANATE 875-125 MG PO TABS: 1.0000 | ORAL_TABLET | Freq: Two times a day (BID) | ORAL | 0 refills | Status: AC

## 2024-05-28 MED ORDER — LINEZOLID 600 MG PO TABS: 600.0000 mg | ORAL_TABLET | Freq: Two times a day (BID) | ORAL | 0 refills | Status: AC

## 2024-05-28 MED ORDER — ALBUTEROL SULFATE (2.5 MG/3ML) 0.083% IN NEBU
2.5000 mg | INHALATION_SOLUTION | Freq: Four times a day (QID) | RESPIRATORY_TRACT | Status: DC
  Administered 2024-05-28 – 2024-05-29 (×3): 2.5 mg via RESPIRATORY_TRACT
  Filled 2024-05-28 (×3): qty 3

## 2024-05-28 MED ORDER — AMOXICILLIN-POT CLAVULANATE 875-125 MG PO TABS: 1.0000 | ORAL_TABLET | Freq: Two times a day (BID) | ORAL | 0 refills | Status: DC

## 2024-05-28 MED ORDER — LINEZOLID 600 MG PO TABS: 600.0000 mg | ORAL_TABLET | Freq: Two times a day (BID) | ORAL | 0 refills | Status: DC

## 2024-05-28 MED ORDER — FUROSEMIDE 10 MG/ML IJ SOLN
40.0000 mg | Freq: Two times a day (BID) | INTRAMUSCULAR | Status: DC
  Administered 2024-05-28: 40 mg via INTRAVENOUS
  Filled 2024-05-28: qty 4

## 2024-05-28 NOTE — Care Management Important Message (Signed)
 Important Message  Patient Details IM Letter given to the Patient. Name: [name] MRN: [id number] Date of Birth: December 07, 1951   Important Message Given:  Yes - Medicare IM     Curtiss Dowdy 05/28/2024, 10:56 AM

## 2024-05-28 NOTE — TOC Transition Note (Addendum)
 Transition of Care [facility]) - Discharge Note   Patient Details  Name: [name] MRN: [id number] Date of Birth: [birth year]  Transition of [facility] CM/SW Contact:  Delilah Fend, LCSW Phone Number: 05/28/2024, 1:34 PM   Clinical Narrative:     Pt medically cleared for dc today back to [facility].  Pt aware and agreeable.  DC summary and FL2 sent to facility for review and they have cleared pt for return.  PTAR called at 1:15pm.  RN has already called report.  No further TOC needs.  ADDENDUM: NOTIFIED THAT DC NOW CANCELLED FOR TODAY DUE TO MEDICAL CONCERNS.  Final next level of care: Assisted Living [facility]) Barriers to Discharge: Barriers Resolved   Patient Goals and CMS Choice Patient states their goals for this hospitalization and ongoing recovery are:: return to [facility]          Discharge Placement                Patient to be transferred to facility by: PTAR Name of family member notified: pt alerted friend Patient and family notified of of transfer: 05/28/24  Discharge Plan and Services Additional resources added to the After Visit Summary for   In-house Referral: Clinical Social Work              DME Arranged: N/A DME Agency: NA       HH Arranged: Charity fundraiser, PT HH Agency: [facility] Date HH Agency Contacted: 05/24/24 Time HH Agency Contacted: 1526 Representative spoke with at [facility] Agency: Loetta Ringer  Social Drivers of Health (SDOH) Interventions SDOH Screenings   Food Insecurity: No Food Insecurity (05/24/2024)  Housing: Low Risk  (05/24/2024)  Transportation Needs: No Transportation Needs (05/24/2024)  Utilities: Not At Risk (05/24/2024)  Depression (PHQ2-9): Low Risk  (05/23/2024)  Social Connections: Moderately Isolated (05/24/2024)  Tobacco Use: Medium Risk (05/23/2024)     Readmission Risk Interventions    05/24/2024    3:23 PM 12/21/2023   11:59 AM 10/14/2023    2:54 PM  Readmission Risk Prevention Plan  Transportation  Screening Complete Complete Complete  PCP or Specialist Appt within 5-7 Days  Complete Complete  PCP or Specialist Appt within 3-5 Days Complete    Home Care Screening  Complete Complete  Medication Review (RN CM)  Complete Complete  Social Work Consult for Recovery Care Planning/Counseling Complete    Palliative Care Screening Not Applicable    Medication Review Oceanographer) Complete

## 2024-05-28 NOTE — Plan of Care (Signed)
   Problem: Coping: Goal: Level of anxiety will decrease Outcome: Progressing   Problem: Safety: Goal: Ability to remain free from injury will improve Outcome: Progressing   Problem: Pain Managment: Goal: General experience of comfort will improve and/or be controlled Outcome: Progressing

## 2024-05-28 NOTE — Discharge Summary (Signed)
 Physician Discharge Summary  [name]:[id number] DOB: Nov 22, 1951 DOA: 05/23/2024  PCP: Vevelyn Gowers, NP  Admit date: 05/23/2024 Discharge date: 05/29/2024  Admitted From: Nursing home Disposition: Nursing home Recommendations for Outpatient Follow-up:  Follow up with PCP in 1-2 weeks Please obtain BMP/CBC in one week  Home Health: None Equipment/Devices: None Discharge Condition: Stable CODE STATUS: Full code Diet recommendation: Cardiac carb modified  Brief/Interim Summary:  73 y.o. male with medical history significant of  Atrial fibrillation on Eliquis , BPH, HLD, HTN ,obesiity,DMII,  left-sided AKA, iron deficiency anemia ,T1N0M0 cecal cancer s/p colectomy (2017) chronic lymphedema. Patient also has recent interim history of admission 5/10-5/11 for GI Bleed for which he underwent blood transfusion and EGD which noted no acute bleeding. Patient was restarted on Eliquis  per gi rec, of note cause of bleeding was not found,patient however s/p PRBC had stable h/h . Patient during that time was also noted to have skin tear on right lower extremity  in background of chronic lymphedema for which he on discharge he was referred to outpatient wound care.Patient  with infected wound and right lower extremity cellulitis. Patient states that over the past week  he has noted new wounds and right leg and has also note increase redness /swelling/drainage and pain. Due to this he presented to ED for evaluation. Patient notes no associated fever/chills. He also noted no chest pain / sob/ n/v/dysuria or diarrhea.  Xray of right foot IMPRESSION: Diffuse soft tissue swelling in the lower leg similar to prior. No acute osseous abnormality. Discharge Diagnoses:  Principal Problem:   Cellulitis  Recurrent Right Lower extremity cellulitis in setting of Chronic Wound and Lymphedema -patient with recurrent admission for right lower extremity cellulitis.  He is status post left above-knee amputation.   He has been admitted with right lower extremity cellulitis.  Seen by wound care recommending cleaning the right lower leg with wound cleanser allowed to air dry and apply Xeroform gauze and covered with ABD pad and Kerlix DAILY.  Blood cultures NO GROWTH ON DC.he was treated with vancomycin  and cefepime . He is discharged on Zyvox and Augmentin for 6 more days.  Please make sure he gets his right lower extremity dressing changes and compression Ace wrap daily.  This will prevent him from getting readmitted to hospital.  AKI stable and resolved  History of paroxysmal atrial fibrillation on Eliquis  rate is controlled without any medications   Anemia recent history of GI bleed hemoglobin has been stable on Eliquis .    DM on metformin  and glipizide  prior to admission.  Continue. -Last A1c was 7  -iss/fs  CBG (last 3)  Recent Labs (last 2 labs)       Recent Labs    05/26/24 1632 05/26/24 2136 05/27/24 0753  GLUCAP 108* 206* 144*      Chronic heart failure with preserved EF continue Lasix  -will reduce the dose of Lasix  to 40 mg daily due to AKI and soft BP  Hyperlipidemia -continue Lipitor   BPH -continue tamsulosin    S/P left-sided AKA   Hx of T1N0M0 cecal cancer - s/p colectomy (2017)   Estimated body mass index is 39.4 kg/m as calculated from the following:   Height as of this encounter: 6' 3 (1.905 m).   Weight as of this encounter: 143 kg.  Discharge Instructions  Discharge Instructions     Diet - low sodium heart healthy   Complete by: As directed    Discharge wound care:   Complete by: As directed  Cleanse right lower leg with wash a wound cleanser do not rinse allow to air dry apply Xeroform gauze to anterior and posterior legs cover with ABD pad and secure with Kerlix beginning just above the toes and ending right below knees.  Apply Ace bandage wrapped in same fashion as Kerlix for light compression.   Discharge wound care:   Complete by: As directed     Cleanse right lower leg with wash a wound cleanser do not rinse allow to air dry apply Xeroform gauze to anterior and posterior legs cover with ABD pad and secure with Kerlix beginning just above the toes and ending right below knees.  Apply Ace bandage wrapped in same fashion as Kerlix for light compression.   Increase activity slowly   Face-to-face encounter (required for Medicare/Medicaid patients)   Complete by: As directed    I Barbee Lew certify that this patient is under my care and that I, or a nurse practitioner or physician's assistant working with me, had a face-to-face encounter that meets the physician face-to-face encounter requirements with this patient on 05/28/2024. The encounter with the patient was in whole, or in part for the following medical condition(s) which is the primary reason for home health care (List medical condition): cellulitis   The encounter with the patient was in whole, or in part, for the following medical condition, which is the primary reason for home health care: cellulitis   I certify that, based on my findings, the following services are medically necessary home health services:  Physical therapy Nursing     Reason for Medically Necessary Home Health Services: Therapy- Investment banker, operational, Patent examiner   My clinical findings support the need for the above services: Shortness of breath with activity   Further, I certify that my clinical findings support that this patient is homebound due to: Unsafe ambulation due to balance issues   Home Health   Complete by: As directed    To provide the following care/treatments:  RN PT     Increase activity slowly   Complete by: As directed    Increase activity slowly   Complete by: As directed       Allergies as of 05/29/2024   No Known Allergies      Medication List     TAKE these medications    acetaminophen  650 MG suppository Commonly known as: TYLENOL  Place 1 suppository  (650 mg total) rectally every 6 (six) hours as needed for mild pain (pain score 1-3) or fever (or Fever >/= 101).   amoxicillin-clavulanate 875-125 MG tablet Commonly known as: AUGMENTIN Take 1 tablet by mouth 2 (two) times daily for 6 days.   apixaban  5 MG Tabs tablet Commonly known as: ELIQUIS  Take 1 tablet (5 mg total) by mouth 2 (two) times daily.   atorvastatin  20 MG tablet Commonly known as: LIPITOR Take 20 mg by mouth every evening.   benzonatate 100 MG capsule Commonly known as: TESSALON Take 100 mg by mouth 3 (three) times daily as needed for cough.   cyanocobalamin  1000 MCG tablet Commonly known as: VITAMIN B12 Take 1 tablet (1,000 mcg total) by mouth daily.   docusate sodium  100 MG capsule Commonly known as: COLACE Take 1 capsule (100 mg total) by mouth 2 (two) times daily.   FeroSul 325 (65 FE) MG tablet Generic drug: ferrous sulfate  Take 325 mg by mouth daily with breakfast.   fluocinolone 0.01 % cream Apply 1 Application topically See admin instructions. Apply  to lower legs 2 times a day   furosemide  40 MG tablet Commonly known as: Lasix  Take 1 tablet (40 mg total) by mouth daily. What changed:  medication strength how much to take when to take this   glipiZIDE  10 MG 24 hr tablet Commonly known as: GLUCOTROL  XL Take 10 mg by mouth daily with breakfast.   Gvoke HypoPen 1-Pack 1 MG/0.2ML Soaj Generic drug: Glucagon Inject 1 mg into the skin as needed (for emergency of severe hypoglycemia).   hydrocerin Crea Apply 1 Application topically 2 (two) times daily. What changed:  when to take this additional instructions   hydrOXYzine  25 MG tablet Commonly known as: ATARAX  Take 1 tablet (25 mg total) by mouth every 8 (eight) hours as needed for itching. Use caution as this medication may make you drowsy What changed:  when to take this additional instructions   linezolid 600 MG tablet Commonly known as: ZYVOX Take 1 tablet (600 mg total) by mouth 2  (two) times daily for 6 days.   metFORMIN  500 MG 24 hr tablet Commonly known as: GLUCOPHAGE -XR Take 1,000 mg by mouth in the morning and at bedtime.   mupirocin  ointment 2 % Commonly known as: BACTROBAN  Apply topically 2 (two) times daily. What changed:  how much to take when to take this additional instructions   potassium chloride  10 MEQ tablet Commonly known as: KLOR-CON  M Take 1 tablet (10 mEq total) by mouth daily. What changed:  medication strength how much to take   tamsulosin  0.4 MG Caps capsule Commonly known as: FLOMAX  Take 1 capsule (0.4 mg total) by mouth daily. What changed: when to take this   traZODone  100 MG tablet Commonly known as: DESYREL  Take 100 mg by mouth at bedtime.   Vitamin D3 125 MCG (5000 UT) Caps Take 5,000 Units by mouth daily.               Discharge Care Instructions  (From admission, onward)           Start     Ordered   05/29/24 0000  Discharge wound care:       Comments: Cleanse right lower leg with wash a wound cleanser do not rinse allow to air dry apply Xeroform gauze to anterior and posterior legs cover with ABD pad and secure with Kerlix beginning just above the toes and ending right below knees.  Apply Ace bandage wrapped in same fashion as Kerlix for light compression.   Increase activity slowly   05/29/24 1211   05/28/24 0000  Discharge wound care:       Comments: Cleanse right lower leg with wash a wound cleanser do not rinse allow to air dry apply Xeroform gauze to anterior and posterior legs cover with ABD pad and secure with Kerlix beginning just above the toes and ending right below knees.  Apply Ace bandage wrapped in same fashion as Kerlix for light compression.   05/28/24 0816            Follow-up Information     Dept. of Social Services/ Medicaid Follow up.   Why: call to speak with assigned case worker as needed Contact information: [phone]        Vevelyn Gowers, NP Follow up.    Specialty: Nurse Practitioner Contact information: [address] [place] [address] [phone]                No Known Allergies  Consultations: None   Procedures/Studies: DG Chest 1 View Result  Date: 05/28/2024 CLINICAL DATA:  Cough EXAM: CHEST  1 VIEW portable semi upright COMPARISON:  X-ray 09/09/2022 and CTA. More recent CTA as well of the chest from 01/27/2024 FINDINGS: Under penetrated x-ray. Stable cardiopericardial silhouette with a tortuous ectatic aorta. Vascular congestion. No pneumothorax, consolidation. Question tiny effusions. The right inferior costophrenic angle is clipped off the edge of the film. IMPRESSION: Limited x-ray. Central vascular prominence. Tortuous ectatic aorta. Electronically Signed   By: Adrianna Horde M.D.   On: 05/28/2024 14:41   DG Tibia/Fibula Right Result Date: 05/24/2024 CLINICAL DATA:  Cellulitis, swelling, erythema EXAM: RIGHT TIBIA AND FIBULA - 2 VIEW COMPARISON:  Radiographs 12/20/2023 FINDINGS: No acute fracture or dislocation. Moderate joint space height loss in the medial compartment. Chronic fracture fragment about the lateral femoral condyle. Diffuse soft tissue swelling. No evidence of osteomyelitis. Vascular calcifications. IMPRESSION: Diffuse soft tissue swelling in the lower leg similar to prior. No acute osseous abnormality. Electronically Signed   By: Rozell Cornet M.D.   On: 05/24/2024 00:06   (Echo, Carotid, EGD, Colonoscopy, ERCP)    Subjective:   no events overnight anxious to go back to the nursing home right leg decreased swelling decreased pain covered with Ace wrap Discharge Exam: Vitals:   05/28/24 2155 05/29/24 0550  BP: (!) 93/54 (!) 102/52  Pulse: 86 (!) 58  Resp: 18 17  Temp: 99.3 F (37.4 C) 98.1 F (36.7 C)  SpO2: 95% 98%   Vitals:   05/28/24 1438 05/28/24 1949 05/28/24 2155 05/29/24 0550  BP:   (!) 93/54 (!) 102/52  Pulse:   86 (!) 58  Resp:   18 17  Temp:   99.3 F (37.4 C) 98.1 F (36.7  C)  TempSrc:   Oral Oral  SpO2: 97% 93% 95% 98%  Weight:      Height:        General: Pt is alert, awake, not in acute distress Cardiovascular: RRR, S1/S2 +, no rubs, no gallops Respiratory: CTA bilaterally, no wheezing, no rhonchi Abdominal: Soft, NT, ND, bowel sounds + Extremities left AKA and right lower extremity edema decreased erythema decreased   The results of significant diagnostics from this hospitalization (including imaging, microbiology, ancillary and laboratory) are listed below for reference.     Microbiology: Recent Results (from the past 240 hours)  Blood culture (routine x 2)     Status: None   Collection Time: 05/24/24 12:06 AM   Specimen: BLOOD  Result Value Ref Range Status   Specimen Description   Final    BLOOD BLOOD LEFT WRIST Performed at [facility], [address] [place], [place] [address]    Special Requests   Final    BOTTLES DRAWN AEROBIC AND ANAEROBIC Blood Culture results may not be optimal due to an inadequate volume of blood received in culture bottles Performed at [facility], [address], [place], [place] [address]    Culture   Final    NO GROWTH 5 DAYS Performed at [facility], [address] [place], [place] [address]    Report Status 05/29/2024 FINAL  Final  Blood culture (routine x 2)     Status: None   Collection Time: 05/24/24 12:06 AM   Specimen: BLOOD RIGHT HAND  Result Value Ref Range Status   Specimen Description   Final    BLOOD RIGHT HAND Performed at [facility], [address], [place], [place] [address]    Special Requests   Final  BOTTLES DRAWN AEROBIC AND ANAEROBIC Blood Culture results may not be optimal due to an inadequate volume of blood received in culture bottles Performed at [facility], [address] [place], [place] [address]    Culture   Final    NO GROWTH 5 DAYS Performed at [facility], [address]  [place], [place] [address]    Report Status 05/29/2024 FINAL  Final     Labs: BNP (last 3 results) Recent Labs    06/10/23 1521  BNP 77.5   Basic Metabolic Panel: Recent Labs  Lab 05/24/24 0404 05/25/24 0841 05/26/24 0333 05/27/24 0334 05/28/24 0329 05/29/24 0347  NA 136  --  134* 135 137 132*  K 3.9  --  4.0 4.0 4.1 4.1  CL 100  --  103 104 105 99  CO2 24  --  24 23 22 23   GLUCOSE 120*  --  131* 192* 152* 159*  BUN 28*  --  22 22 19 23   CREATININE 1.30* 1.16 1.19 1.35* 1.23 1.44*  CALCIUM  8.6*  --  8.3* 8.2* 8.2* 8.0*   Liver Function Tests: Recent Labs  Lab 05/24/24 0404 05/28/24 0329  AST 16 14*  ALT 8 11  ALKPHOS 65 57  BILITOT 0.7 0.7  PROT 7.1 6.7  ALBUMIN 3.2* 2.8*   No results for input(s): LIPASE, AMYLASE in the last 168 hours. No results for input(s): AMMONIA in the last 168 hours. CBC: Recent Labs  Lab 05/24/24 0006 05/24/24 0404 05/28/24 0329  WBC 6.0 6.1 4.7  HGB 9.0* 8.6* 8.8*  HCT 29.0* 28.3* 29.8*  MCV 109.4* 108.8* 113.7*  PLT 170 172 142*   Cardiac Enzymes: No results for input(s): CKTOTAL, CKMB, CKMBINDEX, TROPONINI in the last 168 hours. BNP: Invalid input(s): POCBNP CBG: Recent Labs  Lab 05/28/24 1408 05/28/24 1644 05/28/24 2135 05/29/24 0741 05/29/24 1137  GLUCAP 196* 151* 183* 150* 229*   D-Dimer No results for input(s): DDIMER in the last 72 hours. Hgb A1c No results for input(s): HGBA1C in the last 72 hours. Lipid Profile No results for input(s): CHOL, HDL, LDLCALC, TRIG, CHOLHDL, LDLDIRECT in the last 72 hours. Thyroid  function studies No results for input(s): TSH, T4TOTAL, T3FREE, THYROIDAB in the last 72 hours.  Invalid input(s): FREET3 Anemia work up No results for input(s): VITAMINB12, FOLATE, FERRITIN, TIBC, IRON, RETICCTPCT in the last 72 hours. Urinalysis    Component Value Date/Time   COLORURINE YELLOW 04/21/2024 0432   APPEARANCEUR CLEAR 04/21/2024  0432   LABSPEC 1.019 04/21/2024 0432   PHURINE 5.0 04/21/2024 0432   GLUCOSEU 50 (A) 04/21/2024 0432   HGBUR NEGATIVE 04/21/2024 0432   BILIRUBINUR NEGATIVE 04/21/2024 0432   BILIRUBINUR NEG 05/27/2013 1516   KETONESUR NEGATIVE 04/21/2024 0432   PROTEINUR NEGATIVE 04/21/2024 0432   UROBILINOGEN 0.2 05/27/2013 1516   NITRITE NEGATIVE 04/21/2024 0432   LEUKOCYTESUR NEGATIVE 04/21/2024 0432   Sepsis Labs Recent Labs  Lab 05/24/24 0006 05/24/24 0404 05/28/24 0329  WBC 6.0 6.1 4.7   Microbiology Recent Results (from the past 240 hours)  Blood culture (routine x 2)     Status: None   Collection Time: 05/24/24 12:06 AM   Specimen: BLOOD  Result Value Ref Range Status   Specimen Description   Final    BLOOD BLOOD LEFT WRIST Performed at [facility], [address], [place], [place] [address]    Special Requests   Final    BOTTLES DRAWN AEROBIC AND ANAEROBIC Blood Culture  results may not be optimal due to an inadequate volume of blood received in culture bottles Performed at [facility], [address], [place], [place] [address]    Culture   Final    NO GROWTH 5 DAYS Performed at [facility], [address], [place], [place] [address]    Report Status 05/29/2024 FINAL  Final  Blood culture (routine x 2)     Status: None   Collection Time: 05/24/24 12:06 AM   Specimen: BLOOD RIGHT HAND  Result Value Ref Range Status   Specimen Description   Final    BLOOD RIGHT HAND Performed at [facility], [address], [place], [place] [address]    Special Requests   Final    BOTTLES DRAWN AEROBIC AND ANAEROBIC Blood Culture results may not be optimal due to an inadequate volume of blood received in culture bottles Performed at [facility], [address], [place], [place] [address]    Culture   Final    NO GROWTH 5 DAYS Performed at [facility], [address], [place], [place] [address]    Report  Status 05/29/2024 FINAL  Final     Time coordinating discharge: 39 minutes  SIGNED:   Ozell Blunt, MD  [facility] 05/29/2024, 12:11 PM

## 2024-05-28 NOTE — Progress Notes (Signed)
 Staff preparing patient for discharge and he c/o being cold and unable to get warm. Intense shivering noted. Respirations heavy and patient c/o nausea. Patient denies anxiety causing symptoms.  VS checked T98.8 P80 R20-24 BP149/53 Sats98% on room air. CBG=96.  MD notified and discharge canceled.   Ara Knee, RN 05/28/24 2:21 PM

## 2024-05-28 NOTE — Progress Notes (Addendum)
 PROGRESS NOTE    [name]  ZOX:[id number] DOB: 1951/10/21 DOA: 05/23/2024 PCP: Vevelyn Gowers, NP   Brief Narrative:  73 y.o. male with medical history significant of  Atrial fibrillation on Eliquis , BPH, HLD, HTN ,obesiity,DMII,  left-sided AKA, iron deficiency anemia ,T1N0M0 cecal cancer s/p colectomy (2017) chronic lymphedema. Patient also has recent interim history of admission 5/10-5/11 for GI Bleed for which he underwent blood transfusion and EGD which noted no acute bleeding. Patient was restarted on Eliquis  per gi rec, of note cause of bleeding was not found,patient however s/p PRBC had stable h/h . Patient during that time was also noted to have skin tear on right lower extremity  in background of chronic lymphedema for which he on discharge he was referred to outpatient wound care. Patient it appears s/p discharge back to his facility ALF has not had wound care treatment as previously prescribed. Patient now returns with infected wound and right lower extremity cellulitis. Of note patient has prescription for twice a day dressing changes but it appears at his facility dressing changes completed 3 days instead due to staffing issues.  Patient states that over the past week  he has noted new wounds and right leg and has also note increase redness /swelling/drainage and pain. Due to this he presented to ED for evaluation. Patient notes no associated fever/chills. He also noted no chest pain / sob/ n/v/dysuria or diarrhea.  Xray of right foot IMPRESSION: Diffuse soft tissue swelling in the lower leg similar to prior. No acute osseous abnormality. Assessment & Plan:   Principal Problem:   Cellulitis  Recurrent Right Lower extremity cellulitis in setting of Chronic Wound and Lymphedema -patient with recurrent admission for right lower extremity cellulitis.  He is status post left above-knee amputation.  He is supposed to have dressing changes and wound care daily but this has not been  happening where he is currently due to staffing issues.  He has been admitted with right lower extremity cellulitis.  Seen by wound care recommending cleaning the right lower leg with wound cleanser allowed to air dry and apply Xeroform gauze and covered with ABD pad and Kerlix.  Blood cultures are pending.  Prior blood cultures from early 2025 grew Staph hominis and epidermidis.  Currently on vancomycin  and cefepime . Starting Lasix  20 mg daily he was on 80 mg daily prior to admission but his blood pressure has been soft to normal here we will start him at a low dose and monitor closely  Time at 5 PM chest x-ray shows some vascular congestion Lasix  increased to 40 mg twice daily.  Was only getting 20 mg here though he was on 80 mg at the nursing home due to soft BP.  I have increased Lasix  to 40 twice daily IV.  Follow-up in AM.   AKI  -hold nephrotoxic medication monitor closely on vancomycin   History of paroxysmal atrial fibrillation on Eliquis  rate is controlled without any medications  Anemia recent history of GI bleed hemoglobin has been stable on Eliquis .    DM on metformin  and glipizide  prior to admission -Last A1c was 7  -iss/fs  CBG (last 3)  Recent Labs    05/28/24 1210 05/28/24 1408 05/28/24 1644  GLUCAP 166* 196* 151*   Chronic heart failure with preserved EF -will need to restart Lasix  at some point we will hold it for today  Hyperlipidemia -continue Lipitor   BPH -continue tamsulosin    S/P left-sided AKA   Hx of T1N0M0 cecal cancer -  s/p colectomy (2017)   Estimated body mass index is 39.4 kg/m as calculated from the following:   Height as of this encounter: 6' 3 (1.905 m).   Weight as of this encounter: 143 kg.  DVT prophylaxis: Eliquis   code Status: Full code  Family Communication: None at bedside Disposition Plan:  Status is: Inpatient Remains inpatient appropriate because: Acute illness   Consultants:  None  Procedures: None Antimicrobials: zyvox  cefepime  Subjective:  I had discharged him on the time of discharge patient's daughter feeling cold felt like vomiting and started coughing so discharge canceled Chest x-ray pending Objective: Vitals:   05/28/24 0529 05/28/24 1351 05/28/24 1432 05/28/24 1438  BP: (!) 122/55 (!) 149/53    Pulse: (!) 56     Resp: 18 19    Temp: 97.6 F (36.4 C) 98.8 F (37.1 C)    TempSrc: Oral Oral    SpO2: 96% 98% 97% 97%  Weight:      Height:        Intake/Output Summary (Last 24 hours) at 05/28/2024 1646 Last data filed at 05/28/2024 1216 Gross per 24 hour  Intake 1120 ml  Output 1050 ml  Net 70 ml   Filed Weights   05/24/24 0330 05/24/24 2300  Weight: (!) 143 kg (!) 143 kg    Examination:  General exam: Appears in no acute distress Respiratory system: Mild wheezing respiratory effort normal. Cardiovascular system: S1 & S2 heard, RRR Gastrointestinal system: Abdomen is nondistended, soft and nontender. No organomegaly or masses felt. Normal bowel sounds heard. Central nervous system: Alert and oriented. No focal neurological deficits. Extremities: Left AKA right lower extremity covered with Ace bandage Media pics with improvement in edema  as well as erythema.  Data Reviewed: I have personally reviewed following labs and imaging studies  CBC: Recent Labs  Lab 05/24/24 0006 05/24/24 0404 05/28/24 0329  WBC 6.0 6.1 4.7  HGB 9.0* 8.6* 8.8*  HCT 29.0* 28.3* 29.8*  MCV 109.4* 108.8* 113.7*  PLT 170 172 142*   Basic Metabolic Panel: Recent Labs  Lab 05/24/24 0006 05/24/24 0404 05/25/24 0841 05/26/24 0333 05/27/24 0334 05/28/24 0329  NA 136 136  --  134* 135 137  K 3.9 3.9  --  4.0 4.0 4.1  CL 98 100  --  103 104 105  CO2 27 24  --  24 23 22   GLUCOSE 168* 120*  --  131* 192* 152*  BUN 28* 28*  --  22 22 19   CREATININE 1.52* 1.30* 1.16 1.19 1.35* 1.23  CALCIUM  8.8* 8.6*  --  8.3* 8.2* 8.2*   GFR: Estimated Creatinine Clearance: 81.6 mL/min (by C-G formula based on  SCr of 1.23 mg/dL). Liver Function Tests: Recent Labs  Lab 05/24/24 0404 05/28/24 0329  AST 16 14*  ALT 8 11  ALKPHOS 65 57  BILITOT 0.7 0.7  PROT 7.1 6.7  ALBUMIN 3.2* 2.8*   No results for input(s): LIPASE, AMYLASE in the last 168 hours. No results for input(s): AMMONIA in the last 168 hours. Coagulation Profile: No results for input(s): INR, PROTIME in the last 168 hours. Cardiac Enzymes: No results for input(s): CKTOTAL, CKMB, CKMBINDEX, TROPONINI in the last 168 hours. BNP (last 3 results) No results for input(s): PROBNP in the last 8760 hours. HbA1C: No results for input(s): HGBA1C in the last 72 hours. CBG: Recent Labs  Lab 05/27/24 2126 05/28/24 0737 05/28/24 1210 05/28/24 1408 05/28/24 1644  GLUCAP 215* 157* 166* 196* 151*   Lipid Profile:  No results for input(s): CHOL, HDL, LDLCALC, TRIG, CHOLHDL, LDLDIRECT in the last 72 hours. Thyroid  Function Tests: No results for input(s): TSH, T4TOTAL, FREET4, T3FREE, THYROIDAB in the last 72 hours.  Anemia Panel: No results for input(s): VITAMINB12, FOLATE, FERRITIN, TIBC, IRON, RETICCTPCT in the last 72 hours.  Sepsis Labs: Recent Labs  Lab 05/24/24 0015 05/24/24 0229 05/24/24 0404 05/24/24 0716  LATICACIDVEN 1.9 1.4 1.6 0.9    Recent Results (from the past 240 hours)  Blood culture (routine x 2)     Status: None (Preliminary result)   Collection Time: 05/24/24 12:06 AM   Specimen: BLOOD  Result Value Ref Range Status   Specimen Description   Final    BLOOD BLOOD LEFT WRIST Performed at [facility], [address] [place], [place] [address]    Special Requests   Final    BOTTLES DRAWN AEROBIC AND ANAEROBIC Blood Culture results may not be optimal due to an inadequate volume of blood received in culture bottles Performed at [facility], [address], [place], [place] [address]    Culture   Final    NO  GROWTH 4 DAYS Performed at [facility], [address] [place], [place] [address]    Report Status PENDING  Incomplete  Blood culture (routine x 2)     Status: None (Preliminary result)   Collection Time: 05/24/24 12:06 AM   Specimen: BLOOD RIGHT HAND  Result Value Ref Range Status   Specimen Description   Final    BLOOD RIGHT HAND Performed at [facility], [address], [place], [place] [address]    Special Requests   Final    BOTTLES DRAWN AEROBIC AND ANAEROBIC Blood Culture results may not be optimal due to an inadequate volume of blood received in culture bottles Performed at [facility], [address] [place], [place] [address]    Culture   Final    NO GROWTH 4 DAYS Performed at [facility], [address] [place], [place] [address]    Report Status PENDING  Incomplete     Radiology Studies: DG Chest 1 View Result Date: 05/28/2024 CLINICAL DATA:  Cough EXAM: CHEST  1 VIEW portable semi upright COMPARISON:  X-ray 09/09/2022 and CTA. More recent CTA as well of the chest from 01/27/2024 FINDINGS: Under penetrated x-ray. Stable cardiopericardial silhouette with a tortuous ectatic aorta. Vascular congestion. No pneumothorax, consolidation. Question tiny effusions. The right inferior costophrenic angle is clipped off the edge of the film. IMPRESSION: Limited x-ray. Central vascular prominence. Tortuous ectatic aorta. Electronically Signed   By: Adrianna Horde M.D.   On: 05/28/2024 14:41     Scheduled Meds:  albuterol   2.5 mg Nebulization QID   apixaban   5 mg Oral BID   atorvastatin   20 mg Oral QPM   cholecalciferol   5,000 Units Oral Daily   docusate sodium   100 mg Oral BID   ferrous sulfate   325 mg Oral Q breakfast   furosemide   20 mg Intravenous Daily   insulin  aspart  0-9 Units Subcutaneous TID WC   linezolid  600 mg Oral Q12H   potassium chloride   20 mEq Oral Daily   tamsulosin   0.4 mg Oral Daily   traZODone   100 mg Oral QHS    Continuous Infusions:  ceFEPime  (MAXIPIME ) IV Stopped (05/28/24 0630)     LOS: 4 days    Time spent:  30 min  Barbee Lew, MD 05/28/2024,  4:46 PM

## 2024-05-28 NOTE — NC FL2 (Addendum)
 [place]  MEDICAID FL2 LEVEL OF CARE FORM     IDENTIFICATION  Patient Name: [name] Birthdate: 1951/07/26 Sex: male Admission Date (Current Location): 05/23/2024  [place] and [place] Number:  [name] [id number] T Facility and Address:  [facility],  [address] [place], [place] [address]      Provider Number: [id number]  Attending Physician Name and Address:  Barbee Lew, MD  Relative Name and Phone Number:  friend, Branda Cain @ [phone]    Current Level of Care: Hospital Recommended Level of Care: Assisted Living Facility Prior Approval Number:    Date Approved/Denied:   PASRR Number:    Discharge Plan: Other (Comment) (ALF)    Current Diagnoses: Patient Active Problem List   Diagnosis Date Noted   Heme + stool 04/22/2024   Anemia due to vitamin B12 deficiency 04/22/2024   Blood loss anemia 04/21/2024   Gastrointestinal hemorrhage 04/21/2024   Aortic root dilation (HCC) 02/17/2024   Lymphedema 10/12/2023   Cellulitis 10/11/2023   Osteomyelitis of left foot (HCC) 10/30/2022   Cellulitis of left foot 10/27/2022   IDA (iron deficiency anemia) 09/10/2022   Chronic diastolic CHF (congestive heart failure) (HCC) 09/10/2022   CKD (chronic kidney disease) stage 2, GFR 60-89 ml/min 09/09/2022   Chronic anticoagulation 09/09/2022   History of colon cancer 09/09/2022   Anemia of chronic disease 09/09/2022   Venous stasis ulcers- BLE 09/09/2022   Acute cholecystitis due to biliary calculus 09/09/2022   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   Subacute eczematous & allergic contact dermatitis  09/09/2022   History of COVID-19 09/09/2022   History of homelessness 09/09/2022   Diverticulosis of colon 09/09/2022   Cholelithiasis 09/09/2022   Hepatic steatosis 09/09/2022   Living in assisted living 09/09/2022   Pain in right foot 05/06/2022   Atrial fibrillation with slow ventricular response (HCC) 12/23/2021   Pain  of joint of left ankle and foot 02/26/2021   BPH (benign prostatic hyperplasia) 11/30/2016   Abnormal CT scan, colon    AKI (acute kidney injury) (HCC)    Generalized weakness    Abdominal pain    Moderate protein-calorie malnutrition (HCC) 11/10/2016   Constipation, chronic 11/09/2016   CKD (chronic kidney disease) stage 3, GFR 30-59 ml/min (HCC) 11/09/2016   Hyperbilirubinemia 11/09/2016   Hyponatremia 11/08/2016   RBBB 09/13/2013   Snoring 09/13/2013   Edema 09/07/2013   DM2 (diabetes mellitus, type 2) (HCC) 03/16/2013   Arthritis 03/16/2013   Essential hypertension, benign 02/23/2013   Morbid obesity (HCC) 02/23/2013   Paroxysmal atrial fibrillation (HCC) 02/23/2013   HLD (hyperlipidemia) 02/23/2013    Orientation RESPIRATION BLADDER Height & Weight     Self, Time, Situation, Place  Normal Continent Weight: (!) 315 lb 4.1 oz (143 kg) Height:  6' 3 (190.5 cm)  BEHAVIORAL SYMPTOMS/MOOD NEUROLOGICAL BOWEL NUTRITION STATUS      Continent Diet (low sodium)  AMBULATORY STATUS COMMUNICATION OF NEEDS Skin   Extensive Assist   Other (Comment) (Recurrent Right Lower extremity cellulitis - please see below for wound care instructions)                       Personal Care Assistance Level of Assistance  Bathing, Dressing Bathing Assistance: Limited assistance   Dressing Assistance: Limited assistance     Functional Limitations Info  Sight, Hearing, Speech Sight Info: Adequate Hearing Info: Adequate Speech Info: Adequate    SPECIAL CARE FACTORS FREQUENCY  PT (By licensed PT)  PT Frequency: 2-3x/ wk              Contractures Contractures Info: Not present    Additional Factors Info  Code Status, Allergies Code Status Info: Full Allergies Info: NKDA            Discharge Medications:  TAKE these medications     acetaminophen  650 MG suppository Commonly known as: TYLENOL  Place 1 suppository (650 mg total) rectally every 6 (six) hours as needed for  mild pain (pain score 1-3) or fever (or Fever >/= 101).    amoxicillin-clavulanate 875-125 MG tablet Commonly known as: AUGMENTIN Take 1 tablet by mouth 2 (two) times daily for 6 days.    apixaban  5 MG Tabs tablet Commonly known as: ELIQUIS  Take 1 tablet (5 mg total) by mouth 2 (two) times daily.    atorvastatin  20 MG tablet Commonly known as: LIPITOR Take 20 mg by mouth every evening.    benzonatate 100 MG capsule Commonly known as: TESSALON Take 100 mg by mouth 3 (three) times daily as needed for cough.    cyanocobalamin  1000 MCG tablet Commonly known as: VITAMIN B12 Take 1 tablet (1,000 mcg total) by mouth daily.    docusate sodium  100 MG capsule Commonly known as: COLACE Take 1 capsule (100 mg total) by mouth 2 (two) times daily.    FeroSul 325 (65 FE) MG tablet Generic drug: ferrous sulfate  Take 325 mg by mouth daily with breakfast.    fluocinolone 0.01 % cream Apply 1 Application topically See admin instructions. Apply  to lower legs 2 times a day    furosemide  80 MG tablet Commonly known as: LASIX  Take 80 mg by mouth in the morning.    glipiZIDE  10 MG 24 hr tablet Commonly known as: GLUCOTROL  XL Take 10 mg by mouth daily with breakfast.    Gvoke HypoPen 1-Pack 1 MG/0.2ML Soaj Generic drug: Glucagon Inject 1 mg into the skin as needed (for emergency of severe hypoglycemia).    hydrocerin Crea Apply 1 Application topically 2 (two) times daily. What changed:  when to take this additional instructions    hydrOXYzine  25 MG tablet Commonly known as: ATARAX  Take 1 tablet (25 mg total) by mouth every 8 (eight) hours as needed for itching. Use caution as this medication may make you drowsy What changed:  when to take this additional instructions    linezolid 600 MG tablet Commonly known as: ZYVOX Take 1 tablet (600 mg total) by mouth 2 (two) times daily for 6 days.    metFORMIN  500 MG 24 hr tablet Commonly known as: GLUCOPHAGE -XR Take 1,000 mg by mouth in  the morning and at bedtime.    mupirocin  ointment 2 % Commonly known as: BACTROBAN  Apply topically 2 (two) times daily. What changed:  how much to take when to take this additional instructions    potassium chloride  SA 20 MEQ tablet Commonly known as: KLOR-CON  M Take 20 mEq by mouth daily.    tamsulosin  0.4 MG Caps capsule Commonly known as: FLOMAX  Take 1 capsule (0.4 mg total) by mouth daily. What changed: when to take this    traZODone  100 MG tablet Commonly known as: DESYREL  Take 100 mg by mouth at bedtime.    Vitamin D3 125 MCG (5000 UT) Caps Take 5,000 Units by mouth daily.           Relevant Imaging Results:  Relevant Lab Results:   Additional Information Cleanse right lower leg with wash a wound cleanser do not rinse  allow to air dry apply Xeroform gauze to anterior and posterior legs cover with ABD pad and secure with Kerlix beginning just above the toes and ending right below knees.  Apply Ace bandage wrapped in same fashion as Kerlix for light compression;  SSN [id number]  Sean Macwilliams, LCSW

## 2024-05-28 NOTE — Plan of Care (Signed)

## 2024-05-29 ENCOUNTER — Ambulatory Visit

## 2024-05-29 LAB — CULTURE, BLOOD (ROUTINE X 2)
Culture: NO GROWTH
Culture: NO GROWTH

## 2024-05-29 LAB — GLUCOSE, CAPILLARY
Glucose-Capillary: 150 mg/dL — ABNORMAL HIGH (ref 70–99)
Glucose-Capillary: 229 mg/dL — ABNORMAL HIGH (ref 70–99)

## 2024-05-29 LAB — BASIC METABOLIC PANEL WITH GFR
Anion gap: 10 (ref 5–15)
BUN: 23 mg/dL (ref 8–23)
CO2: 23 mmol/L (ref 22–32)
Calcium: 8 mg/dL — ABNORMAL LOW (ref 8.9–10.3)
Chloride: 99 mmol/L (ref 98–111)
Creatinine, Ser: 1.44 mg/dL — ABNORMAL HIGH (ref 0.61–1.24)
GFR, Estimated: 51 mL/min — ABNORMAL LOW (ref >60)
Glucose, Bld: 159 mg/dL — ABNORMAL HIGH (ref 70–99)
Potassium: 4.1 mmol/L (ref 3.5–5.1)
Sodium: 132 mmol/L — ABNORMAL LOW (ref 135–145)

## 2024-05-29 MED ORDER — FUROSEMIDE 40 MG PO TABS: 40.0000 mg | ORAL_TABLET | Freq: Every day | ORAL | 3 refills | Status: DC

## 2024-05-29 MED ORDER — POTASSIUM CHLORIDE CRYS ER 10 MEQ PO TBCR: 10.0000 meq | EXTENDED_RELEASE_TABLET | Freq: Every day | ORAL | 1 refills | Status: DC

## 2024-05-29 NOTE — Plan of Care (Signed)
   Problem: Education: Goal: Knowledge of General Education information will improve Description Including pain rating scale, medication(s)/side effects and non-pharmacologic comfort measures Outcome: Progressing   Problem: Clinical Measurements: Goal: Diagnostic test results will improve Outcome: Progressing Goal: Respiratory complications will improve Outcome: Progressing

## 2024-05-29 NOTE — TOC Transition Note (Signed)
 Transition of Care [facility]) - Discharge Note   Patient Details  Name: [name] MRN: [id number] Date of Birth: July 09, 1951  Transition of Care [facility]) CM/SW Contact:  Delilah Fend, LCSW Phone Number: 05/29/2024, 2:00 PM   Clinical Narrative:     Pt medically cleared for return today to [facility] and facility has cleared for return as well.  PTAR called at 2:00pm.  No further TOC needs.  Final next level of care: Assisted Living [facility]) Barriers to Discharge: Barriers Resolved   Patient Goals and CMS Choice Patient states their goals for this hospitalization and ongoing recovery are:: return to [facility]          Discharge Placement                Patient to be transferred to facility by: PTAR Name of family member notified: pt alerted friend Patient and family notified of of transfer: 05/28/24  Discharge Plan and Services Additional resources added to the After Visit Summary for   In-house Referral: Clinical Social Work              DME Arranged: N/A DME Agency: NA       HH Arranged: Charity fundraiser, PT HH Agency: [facility] Date HH Agency Contacted: 05/24/24 Time HH Agency Contacted: 1526 Representative spoke with at [facility] Agency: Loetta Ringer  Social Drivers of Health (SDOH) Interventions SDOH Screenings   Food Insecurity: No Food Insecurity (05/24/2024)  Housing: Low Risk  (05/24/2024)  Transportation Needs: No Transportation Needs (05/24/2024)  Utilities: Not At Risk (05/24/2024)  Depression (PHQ2-9): Low Risk  (05/23/2024)  Social Connections: Moderately Isolated (05/24/2024)  Tobacco Use: Medium Risk (05/23/2024)     Readmission Risk Interventions    05/24/2024    3:23 PM 12/21/2023   11:59 AM 10/14/2023    2:54 PM  Readmission Risk Prevention Plan  Transportation Screening Complete Complete Complete  PCP or Specialist Appt within 5-7 Days  Complete Complete  PCP or Specialist Appt within 3-5 Days Complete    Home Care Screening  Complete  Complete  Medication Review (RN CM)  Complete Complete  Social Work Consult for Recovery Care Planning/Counseling Complete    Palliative Care Screening Not Applicable    Medication Review Oceanographer) Complete

## 2024-05-29 NOTE — [place] FL2 (Signed)
 [place]  MEDICAID FL2 LEVEL OF CARE FORM     IDENTIFICATION  Patient Name: [name] Birthdate: 07-Mar-1951 Sex: male Admission Date (Current Location): 05/23/2024  [place] and [place] Number:  [name] [id number] T Facility and Address:  [facility],  [address], [place] [address]      Provider Number: [id number]  Attending Physician Name and Address:  Ozell Blunt, MD  Relative Name and Phone Number:  friend, Branda Cain @ [phone]    Current Level of Care: Hospital Recommended Level of Care: Assisted Living Facility Prior Approval Number:    Date Approved/Denied:   PASRR Number:    Discharge Plan: Other (Comment) (ALF)    Current Diagnoses: Patient Active Problem List   Diagnosis Date Noted   Heme + stool 04/22/2024   Anemia due to vitamin B12 deficiency 04/22/2024   Blood loss anemia 04/21/2024   Gastrointestinal hemorrhage 04/21/2024   Aortic root dilation (HCC) 02/17/2024   Lymphedema 10/12/2023   Cellulitis 10/11/2023   Osteomyelitis of left foot (HCC) 10/30/2022   Cellulitis of left foot 10/27/2022   IDA (iron deficiency anemia) 09/10/2022   Chronic diastolic CHF (congestive heart failure) (HCC) 09/10/2022   CKD (chronic kidney disease) stage 2, GFR 60-89 ml/min 09/09/2022   Chronic anticoagulation 09/09/2022   History of colon cancer 09/09/2022   Anemia of chronic disease 09/09/2022   Venous stasis ulcers- BLE 09/09/2022   Acute cholecystitis due to biliary calculus 09/09/2022   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   Subacute eczematous & allergic contact dermatitis  09/09/2022   History of COVID-19 09/09/2022   History of homelessness 09/09/2022   Diverticulosis of colon 09/09/2022   Cholelithiasis 09/09/2022   Hepatic steatosis 09/09/2022   Living in assisted living 09/09/2022   Pain in right foot 05/06/2022   Atrial fibrillation with slow ventricular response (HCC) 12/23/2021   Pain of  joint of left ankle and foot 02/26/2021   BPH (benign prostatic hyperplasia) 11/30/2016   Abnormal CT scan, colon    AKI (acute kidney injury) (HCC)    Generalized weakness    Abdominal pain    Moderate protein-calorie malnutrition (HCC) 11/10/2016   Constipation, chronic 11/09/2016   CKD (chronic kidney disease) stage 3, GFR 30-59 ml/min (HCC) 11/09/2016   Hyperbilirubinemia 11/09/2016   Hyponatremia 11/08/2016   RBBB 09/13/2013   Snoring 09/13/2013   Edema 09/07/2013   DM2 (diabetes mellitus, type 2) (HCC) 03/16/2013   Arthritis 03/16/2013   Essential hypertension, benign 02/23/2013   Morbid obesity (HCC) 02/23/2013   Paroxysmal atrial fibrillation (HCC) 02/23/2013   HLD (hyperlipidemia) 02/23/2013    Orientation RESPIRATION BLADDER Height & Weight     Self, Time, Situation, Place  Normal Continent Weight: (!) 315 lb 4.1 oz (143 kg) Height:  6' 3 (190.5 cm)  BEHAVIORAL SYMPTOMS/MOOD NEUROLOGICAL BOWEL NUTRITION STATUS      Continent Diet (low sodium)  AMBULATORY STATUS COMMUNICATION OF NEEDS Skin   Extensive Assist   Other (Comment) (Recurrent Right Lower extremity cellulitis - please see below for wound care instructions)                       Personal Care Assistance Level of Assistance  Bathing, Dressing Bathing Assistance: Limited assistance   Dressing Assistance: Limited assistance     Functional Limitations Info  Sight, Hearing, Speech Sight Info: Adequate Hearing Info: Adequate Speech Info: Adequate    SPECIAL CARE FACTORS FREQUENCY  PT (By licensed PT)  PT Frequency: 2-3x/ wk              Contractures Contractures Info: Not present    Additional Factors Info  Code Status, Allergies Code Status Info: Full Allergies Info: NKDA            Discharge Medications: TAKE these medications     acetaminophen  650 MG suppository Commonly known as: TYLENOL  Place 1 suppository (650 mg total) rectally every 6 (six) hours as needed for mild  pain (pain score 1-3) or fever (or Fever >/= 101).    amoxicillin-clavulanate 875-125 MG tablet Commonly known as: AUGMENTIN Take 1 tablet by mouth 2 (two) times daily for 6 days.    apixaban  5 MG Tabs tablet Commonly known as: ELIQUIS  Take 1 tablet (5 mg total) by mouth 2 (two) times daily.    atorvastatin  20 MG tablet Commonly known as: LIPITOR Take 20 mg by mouth every evening.    benzonatate 100 MG capsule Commonly known as: TESSALON Take 100 mg by mouth 3 (three) times daily as needed for cough.    cyanocobalamin  1000 MCG tablet Commonly known as: VITAMIN B12 Take 1 tablet (1,000 mcg total) by mouth daily.    docusate sodium  100 MG capsule Commonly known as: COLACE Take 1 capsule (100 mg total) by mouth 2 (two) times daily.    FeroSul 325 (65 FE) MG tablet Generic drug: ferrous sulfate  Take 325 mg by mouth daily with breakfast.    fluocinolone 0.01 % cream Apply 1 Application topically See admin instructions. Apply  to lower legs 2 times a day    furosemide  40 MG tablet Commonly known as: Lasix  Take 1 tablet (40 mg total) by mouth daily. What changed:  medication strength how much to take when to take this    glipiZIDE  10 MG 24 hr tablet Commonly known as: GLUCOTROL  XL Take 10 mg by mouth daily with breakfast.    Gvoke HypoPen 1-Pack 1 MG/0.2ML Soaj Generic drug: Glucagon Inject 1 mg into the skin as needed (for emergency of severe hypoglycemia).    hydrocerin Crea Apply 1 Application topically 2 (two) times daily. What changed:  when to take this additional instructions    hydrOXYzine  25 MG tablet Commonly known as: ATARAX  Take 1 tablet (25 mg total) by mouth every 8 (eight) hours as needed for itching. Use caution as this medication may make you drowsy What changed:  when to take this additional instructions    linezolid 600 MG tablet Commonly known as: ZYVOX Take 1 tablet (600 mg total) by mouth 2 (two) times daily for 6 days.    metFORMIN  500  MG 24 hr tablet Commonly known as: GLUCOPHAGE -XR Take 1,000 mg by mouth in the morning and at bedtime.    mupirocin  ointment 2 % Commonly known as: BACTROBAN  Apply topically 2 (two) times daily. What changed:  how much to take when to take this additional instructions    potassium chloride  10 MEQ tablet Commonly known as: KLOR-CON  M Take 1 tablet (10 mEq total) by mouth daily. What changed:  medication strength how much to take    tamsulosin  0.4 MG Caps capsule Commonly known as: FLOMAX  Take 1 capsule (0.4 mg total) by mouth daily. What changed: when to take this    traZODone  100 MG tablet Commonly known as: DESYREL  Take 100 mg by mouth at bedtime.    Vitamin D3 125 MCG (5000 UT) Caps Take 5,000 Units by mouth daily.  Additional Information Cleanse right lower leg with wash a wound cleanser do not rinse allow to air dry apply Xeroform gauze to anterior and posterior legs cover with ABD pad and secure with Kerlix beginning just above the toes and ending right below knees.  Apply Ace bandage wrapped in same fashion as Kerlix for light compression;  SSN [id number]  Vienna Folden, LCSW

## 2024-05-29 NOTE — Progress Notes (Incomplete)
 [facility]  PROGRESS NOTE  [name] EAV:[id number] DOB: Feb 13, 1951 DOA: 05/23/2024 PCP: Vevelyn Gowers, NP   Brief HPI:    73 y.o. male with medical history significant of  Atrial fibrillation on Eliquis , BPH, HLD, HTN ,obesiity,DMII,  left-sided AKA, iron deficiency anemia ,T1N0M0 cecal cancer s/p colectomy (2017) chronic lymphedema. Patient also has recent interim history of admission 5/10-5/11 for GI Bleed for which he underwent blood transfusion and EGD which noted no acute bleeding. Patient was restarted on Eliquis  per gi rec, of note cause of bleeding was not found,patient however s/p PRBC had stable h/h  Patient now returns with infected wound and right lower extremity cellulitis. Of note patient has prescription for twice a day dressing changes but it appears at his facility dressing changes completed 3 days instead due to staffing issues. Patient states that over the past week he has noted new wounds and right leg and has also note increase redness /swelling/drainage and pain   Assessment/Plan:   Recurrent Right Lower extremity cellulitis in setting of Chronic Wound and Lymphedema -patient with recurrent admission for right lower extremity cellulitis.  He is status post left above-knee amputation.  He is supposed to have dressing changes and wound care daily but this has not been happening where he is currently due to staffing issues.  He has been admitted with right lower extremity cellulitis.  Seen by wound care recommending cleaning the right lower leg with wound cleanser allowed to air dry and apply Xeroform gauze and covered with ABD pad and Kerlix.  Blood cultures are pending.  Prior blood cultures from early 2025 grew Staph hominis and epidermidis.  Currently on vancomycin  and cefepime . Starting Lasix  20 mg daily he was on 80 mg daily prior to admission but his blood pressure has been soft to normal here we will start him at a low dose and monitor closely   Time at 5 PM  chest x-ray shows some vascular congestion Lasix  increased to 40 mg twice daily.  Was only getting 20 mg here though he was on 80 mg at the nursing home due to soft BP.  I have increased Lasix  to 40 twice daily IV.  Follow-up in AM.   AKI  -hold nephrotoxic medication monitor closely on vancomycin   History of paroxysmal atrial fibrillation on Eliquis  rate is controlled without any medications   Anemia recent history of GI bleed hemoglobin has been stable on Eliquis .    DM on metformin  and glipizide  prior to admission -Last A1c was 7  -iss/fs  CBG (last 3)  Recent Labs (last 2 labs)       Recent Labs    05/28/24 1210 05/28/24 1408 05/28/24 1644  GLUCAP 166* 196* 151*      Chronic heart failure with preserved EF -will need to restart Lasix  at some point we will hold it for today   Hyperlipidemia -continue Lipitor   BPH -continue tamsulosin    S/P left-sided AKA   Hx of T1N0M0 cecal cancer - s/p colectomy (2017)       Medications     albuterol   2.5 mg Nebulization QID   apixaban   5 mg Oral BID   atorvastatin   20 mg Oral QPM   cholecalciferol   5,000 Units Oral Daily   docusate sodium   100 mg Oral BID   ferrous sulfate   325 mg Oral Q breakfast   furosemide   40 mg Intravenous BID   insulin  aspart  0-9 Units Subcutaneous TID WC   linezolid  600  mg Oral Q12H   potassium chloride   20 mEq Oral Daily   tamsulosin   0.4 mg Oral Daily   traZODone   100 mg Oral QHS     Data Reviewed:   CBG:  Recent Labs  Lab 05/28/24 1210 05/28/24 1408 05/28/24 1644 05/28/24 2135 05/29/24 0741  GLUCAP 166* 196* 151* 183* 150*    SpO2: 98 %    Vitals:   05/28/24 1438 05/28/24 1949 05/28/24 2155 05/29/24 0550  BP:   (!) 93/54 (!) 102/52  Pulse:   86 (!) 58  Resp:   18 17  Temp:   99.3 F (37.4 C) 98.1 F (36.7 C)  TempSrc:   Oral Oral  SpO2: 97% 93% 95% 98%  Weight:      Height:          Data Reviewed:  Basic Metabolic Panel: Recent Labs  Lab 05/24/24 0404  05/25/24 0841 05/26/24 0333 05/27/24 0334 05/28/24 0329 05/29/24 0347  NA 136  --  134* 135 137 132*  K 3.9  --  4.0 4.0 4.1 4.1  CL 100  --  103 104 105 99  CO2 24  --  24 23 22 23   GLUCOSE 120*  --  131* 192* 152* 159*  BUN 28*  --  22 22 19 23   CREATININE 1.30* 1.16 1.19 1.35* 1.23 1.44*  CALCIUM  8.6*  --  8.3* 8.2* 8.2* 8.0*    CBC: Recent Labs  Lab 05/24/24 0006 05/24/24 0404 05/28/24 0329  WBC 6.0 6.1 4.7  HGB 9.0* 8.6* 8.8*  HCT 29.0* 28.3* 29.8*  MCV 109.4* 108.8* 113.7*  PLT 170 172 142*    LFT Recent Labs  Lab 05/24/24 0404 05/28/24 0329  AST 16 14*  ALT 8 11  ALKPHOS 65 57  BILITOT 0.7 0.7  PROT 7.1 6.7  ALBUMIN 3.2* 2.8*     Antibiotics: Anti-infectives (From admission, onward)    Start     Dose/Rate Route Frequency Ordered Stop   05/28/24 0000  linezolid (ZYVOX) 600 MG tablet  Status:  Discontinued        600 mg Oral 2 times daily 05/28/24 0814 05/28/24    05/28/24 0000  amoxicillin-clavulanate (AUGMENTIN) 875-125 MG tablet  Status:  Discontinued        1 tablet Oral 2 times daily 05/28/24 0814 05/28/24    05/28/24 0000  amoxicillin-clavulanate (AUGMENTIN) 875-125 MG tablet        1 tablet Oral 2 times daily 05/28/24 1237 06/03/24 2359   05/28/24 0000  linezolid (ZYVOX) 600 MG tablet        600 mg Oral 2 times daily 05/28/24 1237 06/03/24 2359   05/27/24 2200  linezolid (ZYVOX) tablet 600 mg        600 mg Oral Every 12 hours 05/27/24 1048     05/26/24 0600  vancomycin  (VANCOCIN ) IVPB 1000 mg/200 mL premix  Status:  Discontinued        1,000 mg 200 mL/hr over 60 Minutes Intravenous Every 12 hours 05/25/24 1031 05/27/24 1048   05/25/24 0400  vancomycin  (VANCOREADY) IVPB 1750 mg/350 mL  Status:  Discontinued        1,750 mg 175 mL/hr over 120 Minutes Intravenous Every 24 hours 05/24/24 0358 05/25/24 1031   05/24/24 1300  ceFEPIme  (MAXIPIME ) 2 g in sodium chloride  0.9 % 100 mL IVPB        2 g 200 mL/hr over 30 Minutes Intravenous Every 8 hours  05/24/24 0730     05/24/24  7829  vancomycin  (VANCOREADY) IVPB 2000 mg/400 mL        2,000 mg 200 mL/hr over 120 Minutes Intravenous  Once 05/24/24 0350 05/24/24 0751   05/24/24 0445  ceFEPIme  (MAXIPIME ) 2 g in sodium chloride  0.9 % 100 mL IVPB  Status:  Discontinued        2 g 200 mL/hr over 30 Minutes Intravenous Every 12 hours 05/24/24 0350 05/24/24 0730   05/24/24 0430  vancomycin  (VANCOCIN ) IVPB 1000 mg/200 mL premix  Status:  Discontinued        1,000 mg 200 mL/hr over 60 Minutes Intravenous  Once 05/24/24 0344 05/24/24 0349   05/24/24 0430  ceFEPIme  (MAXIPIME ) 2 g in sodium chloride  0.9 % 100 mL IVPB  Status:  Discontinued        2 g 200 mL/hr over 30 Minutes Intravenous  Once 05/24/24 0344 05/24/24 0350   05/23/24 2345  cefTRIAXone  (ROCEPHIN ) 2 g in sodium chloride  0.9 % 100 mL IVPB        2 g 200 mL/hr over 30 Minutes Intravenous  Once 05/23/24 2344 05/24/24 0057        DVT prophylaxis: Apixaban   Code Status: ***  Family Communication: ***   CONSULTS ***   Subjective   ***   Objective    Physical Examination:   General:  *** Cardiovascular: *** Respiratory: *** Abdomen: *** Extremities: ***  Neurologic:  ***   Status is: Inpatient:  ***           Eshal Propps S Hollynn Garno   [facility] If 7PM-7AM, please contact night-coverage at [email or web address], Office  [phone]   05/29/2024, 7:54 AM  LOS: 5 days

## 2024-05-30 ENCOUNTER — Ambulatory Visit: Admitting: *Deleted

## 2024-05-30 VITALS — BP 112/68 | HR 62 | Temp 97.9°F | Resp 20 | Ht 75.0 in

## 2024-05-30 DIAGNOSIS — D509 Iron deficiency anemia, unspecified: Secondary | ICD-10-CM

## 2024-05-30 DIAGNOSIS — D519 Vitamin B12 deficiency anemia, unspecified: Secondary | ICD-10-CM | POA: Diagnosis not present

## 2024-05-30 MED ORDER — SODIUM CHLORIDE 0.9 % IV SOLN
510.0000 mg | Freq: Once | INTRAVENOUS | Status: AC
  Administered 2024-05-30: 510 mg via INTRAVENOUS
  Filled 2024-05-30: qty 17

## 2024-05-30 NOTE — Progress Notes (Signed)
 Diagnosis: Iron Deficiency Anemia  Provider:  Mannam, Praveen MD  Procedure: IV Infusion  IV Type: Peripheral, IV Location: R Forearm  Feraheme (Ferumoxytol ), Dose: 510 mg  Infusion Start Time: 1037 am  Infusion Stop Time: 1100 am  Post Infusion IV Care: Observation period completed and Peripheral IV Discontinued  Discharge: Condition: Good, Destination: Home . AVS Provided  Performed by:  Mayme Spearman, RN

## 2024-06-01 ENCOUNTER — Ambulatory Visit (HOSPITAL_BASED_OUTPATIENT_CLINIC_OR_DEPARTMENT_OTHER): Admitting: Family

## 2024-06-05 ENCOUNTER — Encounter: Payer: Self-pay | Admitting: Internal Medicine

## 2024-06-05 ENCOUNTER — Ambulatory Visit: Attending: Internal Medicine | Admitting: Internal Medicine

## 2024-06-05 VITALS — BP 120/60 | HR 68 | Ht 75.0 in | Wt 227.0 lb

## 2024-06-05 DIAGNOSIS — I10 Essential (primary) hypertension: Secondary | ICD-10-CM

## 2024-06-05 DIAGNOSIS — I890 Lymphedema, not elsewhere classified: Secondary | ICD-10-CM

## 2024-06-05 DIAGNOSIS — I5032 Chronic diastolic (congestive) heart failure: Secondary | ICD-10-CM

## 2024-06-05 DIAGNOSIS — I77810 Thoracic aortic ectasia: Secondary | ICD-10-CM

## 2024-06-05 DIAGNOSIS — I4821 Permanent atrial fibrillation: Secondary | ICD-10-CM

## 2024-06-05 NOTE — Patient Instructions (Addendum)
 Medication Instructions:  Your physician recommends that you continue on your current medications as directed. Please refer to the Current Medication list given to you today.  *If you need a refill on your cardiac medications before your next appointment, please call your pharmacy*  Testing/Procedures: Repeat CT Angio Aorta in August or September   Follow-Up: At [facility], you and your health needs are our priority.  As part of our continuing mission to provide you with exceptional heart care, our providers are all part of one team.  This team includes your primary Cardiologist (physician) and Advanced Practice Providers or APPs (Physician Assistants and Nurse Practitioners) who all work together to provide you with the care you need, when you need it.  Your next appointment:   6 moths with Dr. Mona or Reche Finder NP

## 2024-06-05 NOTE — Progress Notes (Signed)
 OFFICE NOTE  Chief Complaint:  Follow-up  Primary Care Physician: Arloa Jarvis, NP  HPI:  [name] is a pleasant 73 year old gentleman who has moved from [place]  to this area and wishes to establish care with a new cardiologist. He was seen by Dr. Loreli and urgent medical care and kindly referred to our practice. Unfortunately we do not have records at this time to review, but he does say that he has some records at home that we could make copies of. We will also attempt to get records from his cardiologist in [place] . Per [name] his report, he has a history of atrial fibrillation which he says he's had for years. He always knew he had palpitations but prior to a cataract surgery he was told he was in atrial fibrillation and was formally referred to a cardiologist. He was started on [place] do a number of risk factors and a high CHADS2VASC score. He is also has morbid obesity, hypertension, dyslipidemia, diabetes type 2 and a distant family history of heart disease.  He reports he had an echocardiogram at his cardiologist's office and was told that his heart is unusually strong for atrial fibrillation. He denies any chest pain, but does get short of breath with exertion. In addition he reports loud snoring at night and periods of awakening due to gasping for breath. He has always thought that he may have sleep apnea but has not been formally tested for that.  Finally, he has been struggling with lower study swelling and has a diagnosis of lichen planus, but seems to develop blisters which slough and has some open sores particularly of the left medial lower leg. He was placed recently on furosemide  to reduce his lower extremity swelling.  06/05/2024  [name] is seen today for follow-up.  He initially established care with me in 2014 but had not been seen in follow-up.  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  Digoxin  was discontinued.  Ultimately metoprolol  was stopped and his EF was 60 to 65% at that time.  Aortic root was noted to be dilated at 47 mm.  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  He has had progressive lower extremity edema requiring Lasix  currently on 80 mg daily.  He was admitted again in October to November of 2024 with cellulitis and lymphedema treated with antibiotics and diuresis.  More recently he was seen by Reche Finder, NP for follow-up from an assisted living facility.  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  CT surgical referral was recommended.  He did follow-up with Dr. 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  He was felt to be at high risk for possible surgery.  Repeat echo was obtained in April 2025 which showed normal LVEF 60 to 65%.  The aortic root was apparently not well-visualized, but did measure 4.2 cm.  He was just hospitalized and recently discharged for cellulitis and lymphedema.  His Lasix  apparently has been cut back from 80 to 40 mg daily for AKI and soft blood pressure.  He is aware that if his edema worsens he could take extra Lasix .  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   Cataract    Constipation, chronic 11/09/2016   Diabetes mellitus without complication (HCC)    History of COVID-19 09/09/2022   History of homelessness 09/09/2022   History of kidney stones 09/09/2022   History of Stercoral ulcer of rectum 11/28/2016   Hyperlipidemia    Hypertension    Lichen planus    bilateral legs   Lymphedema    Nausea & vomiting 09/09/2022   Obesity (BMI 30-39.9) 09/09/2022    Personal history of cecal colon cancer 09/09/2022    Past Surgical History:  Procedure Laterality Date   AMPUTATION Left 11/03/2022   Procedure: BELOW KNEE  AMPUTATION, LEFT;  Surgeon: Harden Jerona GAILS, MD;  Location: [facility] OR;  Service: Orthopedics;  Laterality: Left;   APPENDECTOMY     APPLICATION OF WOUND VAC Left 11/03/2022   Procedure: APPLICATION OF WOUND VAC;  Surgeon: Harden Jerona GAILS, MD;  Location: [facility] OR;  Service: Orthopedics;  Laterality: Left;   BONE BIOPSY  04/22/2024   Procedure: BIOPSY, GI;  Surgeon: Avram Lupita BRAVO, MD;  Location: [facility];  Service: Gastroenterology;;   CHOLECYSTECTOMY N/A 09/11/2022   Procedure: LAPAROSCOPIC CHOLECYSTECTOMY;  Surgeon: Vernetta Berg, MD;  Location: [facility];  Service: General;  Laterality: N/A;   COLON RESECTION N/A 11/22/2016   Procedure: HAND ASSISTED LAPAROSCOPIC COLON RESECTION;  Surgeon: Krystal Russell, MD;  Location: [facility];  Service: General;  Laterality: N/A;   COLONOSCOPY N/A 11/17/2016   Procedure: COLONOSCOPY;  Surgeon: Gwendlyn ONEIDA Buddy, MD;  Location: [facility];  Service: Endoscopy;  Laterality: N/A;   COLONOSCOPY WITH PROPOFOL  N/A 08/29/2023   Procedure: COLONOSCOPY WITH PROPOFOL ;  Surgeon: Abran Norleen SAILOR, MD;  Location: [facility];  Service: Gastroenterology;  Laterality: N/A;   ESOPHAGOGASTRODUODENOSCOPY N/A 04/22/2024   Procedure: EGD (ESOPHAGOGASTRODUODENOSCOPY);  Surgeon: Avram Lupita BRAVO, MD;  Location: [facility];  Service: Gastroenterology;  Laterality: N/A;   FRACTURE SURGERY     MULTIPLE TOOTH EXTRACTIONS     RECTAL EXAM UNDER ANESTHESIA N/A 12/05/2016   Procedure: RECTAL EXAM UNDER ANESTHESIA, DISIMPACTION;  Surgeon: Alm Angle, MD;  Location: [facility];  Service: General;  Laterality: N/A;    FAMHx:  Family History  Problem Relation Age of Onset   Brain cancer Mother    Alzheimer's disease Father    Cancer Maternal Grandmother    Heart attack Maternal Grandfather    Pneumonia Paternal Grandfather    Colon  cancer Neg Hx    Esophageal cancer Neg Hx    Stomach cancer Neg Hx     SOCHx:   reports that he quit smoking about 40 years ago. His smoking use included pipe. He has never used smokeless tobacco. He reports that he does not drink alcohol  and does not use drugs.  ALLERGIES:  No Known Allergies  ROS: Pertinent items noted in HPI and remainder of comprehensive ROS otherwise negative.   HOME MEDS: Current Outpatient Medications  Medication Sig Dispense Refill   acetaminophen  (TYLENOL ) 650 MG suppository Place 1 suppository (650 mg total) rectally every 6 (six) hours as needed for mild pain (pain score 1-3) or fever (or Fever >/= 101). 12 suppository 0   apixaban  (ELIQUIS ) 5 MG TABS tablet Take 1 tablet (5 mg total) by mouth 2 (two) times daily. 60 tablet 5   atorvastatin  (LIPITOR) 20 MG tablet Take 20 mg by mouth every evening.  benzonatate (TESSALON) 100 MG capsule Take 100 mg by mouth 3 (three) times daily as needed for cough.     Cholecalciferol  (VITAMIN D3) 125 MCG (5000 UT) CAPS Take 5,000 Units by mouth daily.     cyanocobalamin  (VITAMIN B12) 1000 MCG tablet Take 1 tablet (1,000 mcg total) by mouth daily.     docusate sodium  (COLACE) 100 MG capsule Take 1 capsule (100 mg total) by mouth 2 (two) times daily.     ferrous sulfate  (FEROSUL) 325 (65 FE) MG tablet Take 325 mg by mouth daily with breakfast.     fluocinolone 0.01 % cream Apply 1 Application topically See admin instructions. Apply  to lower legs 2 times a day     furosemide  (LASIX ) 40 MG tablet Take 1 tablet (40 mg total) by mouth daily. 30 tablet 3   glipiZIDE  (GLUCOTROL  XL) 10 MG 24 hr tablet Take 10 mg by mouth daily with breakfast.     Glucagon (GVOKE HYPOPEN 1-PACK) 1 MG/0.2ML SOAJ Inject 1 mg into the skin as needed (for emergency of severe hypoglycemia).     hydrocerin (EUCERIN) CREA Apply 1 Application topically 2 (two) times daily. (Patient taking differently: Apply 1 Application topically See admin instructions.  Apply to affected areas 2 times a day)     hydrOXYzine  (ATARAX /VISTARIL ) 25 MG tablet Take 1 tablet (25 mg total) by mouth every 8 (eight) hours as needed for itching. Use caution as this medication may make you drowsy (Patient taking differently: Take 25 mg by mouth every 6 (six) hours as needed for itching.) 10 tablet 0   metFORMIN  (GLUCOPHAGE -XR) 500 MG 24 hr tablet Take 1,000 mg by mouth in the morning and at bedtime.     mupirocin  ointment (BACTROBAN ) 2 % Apply topically 2 (two) times daily. (Patient taking differently: Apply 1 Application topically See admin instructions. Apply to affected areas 2 times a day as a part of wound care)     potassium chloride  (KLOR-CON  M) 10 MEQ tablet Take 1 tablet (10 mEq total) by mouth daily. 30 tablet 1   tamsulosin  (FLOMAX ) 0.4 MG CAPS capsule Take 1 capsule (0.4 mg total) by mouth daily. (Patient taking differently: Take 0.4 mg by mouth every evening.)     traZODone  (DESYREL ) 100 MG tablet Take 100 mg by mouth at bedtime.     No current facility-administered medications for this visit.    LABS/IMAGING: No results found for this or any previous visit (from the past 48 hours). No results found.  VITALS: BP 120/60 (BP Location: Right Arm, Patient Position: Sitting, Cuff Size: Normal)   Pulse 68   Ht 6' 3 (1.905 m)   Wt 227 lb (103 kg)   SpO2 94%   BMI 28.37 kg/m   EXAM: General appearance: alert, no distress, morbidly obese, and pale Neck: no carotid bruit, no JVD, and thyroid  not enlarged, symmetric, no tenderness/mass/nodules Lungs: clear to auscultation bilaterally Heart: irregularly irregular rhythm Abdomen: soft, non-tender; bowel sounds normal; no masses,  no organomegaly and obese Extremities: Status post left BKA, right lower extremity is in a compression wrap, trace to 1+ edema proximal to the Pulses: 2+ and symmetric Skin: Multiple excoriations and skin lesions Neurologic: Grossly normal Psych: Pleasant   EKG: EKG  Interpretation Date/Time:  Tuesday June 05 2024 12:04:28 EDT Ventricular Rate:  68 PR Interval:    QRS Duration:  158 QT Interval:  450 QTC Calculation: 478 R Axis:   258  Text Interpretation: Atrial fibrillation Right bundle branch block Possible  Lateral infarct , age undetermined When compared with ECG of 10-Jun-2023 14:12, No significant change was found Confirmed by Reese Allen [phone]) on 06/05/2024 12:07:00 PM   ASSESSMENT: Longstanding persistent, if not permanent atrial fibrillation with generally controlled ventricular response, right bundle branch block Anticoagulation with Eliquis  Dyslipidemia-on statin Hypertension-controlled Morbid obesity-he is enrolled in [employer] Lower extremity edema-now on Lasix  Snoring and hypersomnolence-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   [name] has had recurrent issues with lymphedema and lower extremity cellulitis.  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  He was found to have an aortic root aneurysm measuring up to 5.5 cm however saw Dr. Shyrl who felt that he was a very marginal candidate for surgery.  He recommended a repeat echo in 2 months which unfortunately did not visualize the aortic root well.  I will order repeat CT scan to reassess the aorta in 6 months at radiology's recommendations which will be August or September and likely he will need referral back to CT surgery for reevaluation based on those findings.  Otherwise he should continue to monitor his edema especially on lower dose Lasix  and could take an extra 40 mg Lasix  if his edema worsens.  Will plan a follow-up in 6 months with me or Reche Finder, NP.  Allen KYM Mona, MD, [facility], FNLA, FACP  [place]  [facility]  Medical Director  of the [facility]  [facility] Diplomate of the American Board of Clinical Lipidology Attending Cardiologist  Direct Dial: [phone]  Fax: [phone]  Website:  [email or web address] [name] [name] 06/05/2024, 12:07 PM

## 2024-06-12 ENCOUNTER — Encounter (HOSPITAL_BASED_OUTPATIENT_CLINIC_OR_DEPARTMENT_OTHER): Payer: Self-pay

## 2024-06-26 ENCOUNTER — Encounter: Payer: Self-pay | Admitting: Dietician

## 2024-06-26 ENCOUNTER — Encounter: Attending: Family | Admitting: Dietician

## 2024-06-26 DIAGNOSIS — E119 Type 2 diabetes mellitus without complications: Secondary | ICD-10-CM | POA: Insufficient documentation

## 2024-06-26 NOTE — Patient Instructions (Addendum)
 Previous Goals Established by Patient:   Goal 1: aim to eat 1 non-starchy vegetable every day (see list below of ones we talked about to keep in your room).   Goal 2: include a protein source with all meals/snacks (breakfast: cottage cheese or peanut butter or egg or protein shake).    Goal 3: do chair exercises for 15 minutes daily.

## 2024-06-26 NOTE — Progress Notes (Signed)
 Diabetes Self-Management Education  Visit Type: Follow-up  Appt. Start Time: 1045 Appt. End Time: 1130  06/26/2024  [name], identified by name and date of birth, is a 73 y.o. male with a diagnosis of Diabetes: type 2 .   ASSESSMENT History includes: cancer, cataracts, type 2 diabetes, HLD, HTN,  Labs noted: 12/20/23 A1c 7.0% Medications include: glipizide , metformin   Supplements: vitamin b12, ferrous sulfate , MVI, vitamin C , vitamin d3.    Pt reports he bought more vegetables.   Pt states he was hospitalized fathers day weekend due to open wounds. Pt states he still has a wound on his leg he is having treated.   Pt states he has increased protein intake.  Pt states he has not been getting as hungry. Pt reports if he's writing or drawing he won't think about eating.   Pt reports they test his blood glucose at 6am at the assisted living facility.   Pt reports he usually has breakfast in his room, lunch at dining hall, and dinner in his room.   Pt states he is going to have physical therapy and occupational therapy starting soon.   Pt reports he occasionally buys Ensures.   Assessment of Previous Goals Established by Patient: (pt wants to continue with all previous goals)   Goal 1: aim to eat 1 non-starchy vegetable every day (see list below of ones we talked about to keep in your room). - goal in progress, continue!   Goal 2: include a protein source with all meals/snacks (even if just a protein shake with your cereal). - goal in progress, continue!   Goal 3: do chair exercises for 15 minutes daily. - goal met, continue!  There were no vitals taken for this visit. There is no height or weight on file to calculate BMI.   Diabetes Self-Management Education - 06/26/24 1035       Visit Information   Visit Type Follow-up      Health Coping   How would you rate your overall health? Good      Psychosocial Assessment   Patient Belief/Attitude about Diabetes  Motivated to manage diabetes    What is the hardest part about your diabetes right now, causing you the most concern, or is the most worrisome to you about your diabetes?   Making healty food and beverage choices    Self-care barriers None    Self-management support Doctor's office    Other persons present Patient    Patient Concerns Nutrition/Meal planning    Special Needs None    Preferred Learning Style No preference indicated    Learning Readiness Ready      Pre-Education Assessment   Patient understands the diabetes disease and treatment process. Needs Review    Patient understands incorporating nutritional management into lifestyle. Needs Review    Patient undertands incorporating physical activity into lifestyle. Needs Review    Patient understands using medications safely. Needs Review    Patient understands monitoring blood glucose, interpreting and using results Needs Review    Patient understands prevention, detection, and treatment of acute complications. Needs Review    Patient understands prevention, detection, and treatment of chronic complications. Needs Review    Patient understands how to develop strategies to address psychosocial issues. Needs Review    Patient understands how to develop strategies to promote health/change behavior. Needs Review      Complications   Last HgB A1C per patient/outside source 7 %    How often do you check your  blood sugar? 1-2 times/day    Fasting Blood glucose range (mg/dL) 869-820;29-870      Dietary Intake   Breakfast rice chex and banana and 2% milk    Snack (morning) none    Lunch 1pm: rice and baked chicken thigh/leg, green beans    Snack (afternoon) none    Dinner [place] and cheese sandwich OR beef and bean burrito    Snack (evening) none    Beverage(s) 1 gallon water      Activity / Exercise   Activity / Exercise Type ADL's      Patient Education   Previous Diabetes Education Yes    Disease Pathophysiology Explored  patient's options for treatment of their diabetes;Factors that contribute to the development of diabetes    Healthy Eating Role of diet in the treatment of diabetes and the relationship between the three main macronutrients and blood glucose level;Plate Method    Being Active Helped patient identify appropriate exercises in relation to his/her diabetes, diabetes complications and other health issue.    Medications Reviewed patients medication for diabetes, action, purpose, timing of dose and side effects.    Monitoring Purpose and frequency of SMBG.    Chronic complications Relationship between chronic complications and blood glucose control;Identified and discussed with patient  current chronic complications    Diabetes Stress and Support Identified and addressed patients feelings and concerns about diabetes    Lifestyle and Health Coping Lifestyle issues that need to be addressed for better diabetes care      Individualized Goals (developed by patient)   Nutrition General guidelines for healthy choices and portions discussed    Physical Activity Exercise 5-7 days per week;15 minutes per day    Medications take my medication as prescribed    Monitoring  Test my blood glucose as discussed    Problem Solving Eating Pattern    Reducing Risk examine blood glucose patterns;do foot checks daily;treat hypoglycemia with 15 grams of carbs if blood glucose less than 70mg /dL    Health Coping Ask for help with psychological, social, or emotional issues      Patient Self-Evaluation of Goals - Patient rates self as meeting previously set goals (% of time)   Nutrition 50 - 75 % (half of the time)    Physical Activity 25 - 50% (sometimes)    Medications >75% (most of the time)    Monitoring >75% (most of the time)    Problem Solving and behavior change strategies  50 - 75 % (half of the time)    Reducing Risk (treating acute and chronic complications) 50 - 75 % (half of the time)    Health Coping 50 - 75 %  (half of the time)      Post-Education Assessment   Patient understands the diabetes disease and treatment process. Comprehends key points    Patient understands incorporating nutritional management into lifestyle. Comprehends key points    Patient undertands incorporating physical activity into lifestyle. Comprehends key points    Patient understands using medications safely. Comphrehends key points    Patient understands monitoring blood glucose, interpreting and using results Comprehends key points    Patient understands prevention, detection, and treatment of acute complications. Comprehends key points    Patient understands prevention, detection, and treatment of chronic complications. Comprehends key points    Patient understands how to develop strategies to address psychosocial issues. Comprehends key points    Patient understands how to develop strategies to promote health/change behavior. Comprehends key points  Outcomes   Expected Outcomes Demonstrated interest in learning. Expect positive outcomes    Future DMSE 3-4 months    Program Status Not Completed      Subsequent Visit   Since your last visit have you continued or begun to take your medications as prescribed? Yes          Individualized Plan for Diabetes Self-Management Training:   Learning Objective:  Patient will have a greater understanding of diabetes self-management. Patient education plan is to attend individual and/or group sessions per assessed needs and concerns.   Plan:   Patient Instructions  Previous Goals Established by Patient:   Goal 1: aim to eat 1 non-starchy vegetable every day (see list below of ones we talked about to keep in your room).   Goal 2: include a protein source with all meals/snacks (breakfast: cottage cheese or peanut butter or egg or protein shake).    Goal 3: do chair exercises for 15 minutes daily.   Expected Outcomes:  Demonstrated interest in learning. Expect positive  outcomes  Education material provided: no handout on this follow up  If problems or questions, patient to contact team via:  Phone  Future DSME appointment: 3-4 months

## 2024-07-18 ENCOUNTER — Ambulatory Visit (HOSPITAL_BASED_OUTPATIENT_CLINIC_OR_DEPARTMENT_OTHER): Admission: RE | Admit: 2024-07-18 | Source: Ambulatory Visit

## 2024-08-07 ENCOUNTER — Ambulatory Visit (HOSPITAL_BASED_OUTPATIENT_CLINIC_OR_DEPARTMENT_OTHER): Admission: RE | Admit: 2024-08-07 | Source: Ambulatory Visit

## 2024-08-21 ENCOUNTER — Ambulatory Visit (HOSPITAL_COMMUNITY)
Admission: RE | Admit: 2024-08-21 | Discharge: 2024-08-21 | Disposition: A | Discharge status: Home / Self Care | Source: Ambulatory Visit | Attending: Family | Admitting: Family

## 2024-08-21 ENCOUNTER — Ambulatory Visit (HOSPITAL_BASED_OUTPATIENT_CLINIC_OR_DEPARTMENT_OTHER): Payer: Self-pay | Admitting: Family

## 2024-08-21 DIAGNOSIS — I77810 Thoracic aortic ectasia: Secondary | ICD-10-CM | POA: Diagnosis present

## 2024-08-21 DIAGNOSIS — Z538 Procedure and treatment not carried out for other reasons: Secondary | ICD-10-CM | POA: Insufficient documentation

## 2024-08-21 LAB — POCT I-STAT CREATININE: Creatinine, Ser: 1.4 mg/dL — ABNORMAL HIGH (ref 0.61–1.24)

## 2024-08-21 MED ORDER — IOHEXOL 350 MG/ML SOLN
75.0000 mL | Freq: Once | INTRAVENOUS | Status: AC | PRN
  Administered 2024-08-21: 75 mL via INTRAVENOUS

## 2024-08-21 NOTE — Progress Notes (Signed)
 Patient presents to [facility] for CTA chest/aorta.  IV started with US  guidance by CT tech (Left AC).   IV extravasation noted during study, contrast injection stopped.   Very minimal swelling/edema to site, no redness.  Patient denies loss of sensation to extremity, +2 radial pulse.  IV removed, ice pack and dressing applied to site.  MD made aware.  Precautions discussed, education sheet provided.

## 2024-08-22 ENCOUNTER — Telehealth (HOSPITAL_COMMUNITY): Payer: Self-pay | Admitting: *Deleted

## 2024-08-22 NOTE — Telephone Encounter (Signed)
 Attempted to call patient to follow up on IV extravasation-unable to leave VM.

## 2024-08-24 ENCOUNTER — Ambulatory Visit: Admitting: Thoracic Surgery (Cardiothoracic Vascular Surgery)

## 2024-08-28 ENCOUNTER — Ambulatory Visit (HOSPITAL_BASED_OUTPATIENT_CLINIC_OR_DEPARTMENT_OTHER): Payer: Self-pay | Admitting: Family

## 2024-09-07 ENCOUNTER — Ambulatory Visit
Attending: Thoracic Surgery (Cardiothoracic Vascular Surgery) | Admitting: Thoracic Surgery (Cardiothoracic Vascular Surgery)

## 2024-09-07 DIAGNOSIS — I77810 Thoracic aortic ectasia: Secondary | ICD-10-CM | POA: Diagnosis not present

## 2024-09-07 NOTE — Progress Notes (Signed)
 [address]       [place] [address]             [phone]       Patient: Home Provider: Office Consent for Telemedicine visit obtained.  Today's visit was completed via a real-time telehealth (see specific modality noted below). The patient/authorized person provided oral consent at the time of the visit to engage in a telemedicine encounter with the present provider at [facility]. The patient/authorized person was informed of the potential benefits, limitations, and risks of telemedicine. The patient/authorized person expressed understanding that the laws that protect confidentiality also apply to telemedicine. The patient/authorized person acknowledged understanding that telemedicine does not provide emergency services and that he or she would need to call 911 or proceed to the nearest hospital for help if such a need arose.   Total time spent in the clinical discussion 10 minutes.  Telehealth Modality: Phone visit (audio only)  Recent Radiology Findings:   CT ANGIO CHEST AORTA W/CM & OR WO/CM Result Date: 08/28/2024 CLINICAL DATA:  73 year old male with history of thoracic aortic aneurysm. Follow-up study. EXAM: CT ANGIOGRAPHY CHEST WITH CONTRAST TECHNIQUE: Multidetector CT imaging of the chest was performed using the standard protocol during bolus administration of intravenous contrast. Multiplanar CT image reconstructions and MIPs were obtained to evaluate the vascular anatomy. RADIATION DOSE REDUCTION: This exam was performed according to the departmental dose-optimization program which includes automated exposure control, adjustment of the mA and/or kV according to patient size and/or use of iterative reconstruction technique. CONTRAST:  Seventy-five mL Omnipaque  350, intravenous COMPARISON:  01/27/2024 FINDINGS: Cardiovascular: Preferential opacification of the thoracic aorta. No evidence of thoracic aortic dissection. Mild scattered atherosclerotic calcifications  of the thoracic aorta. Normal heart size. No pericardial effusion. Sinues of Valsalva: 44 mm 38 x 41 mm ,unchanged Sinotubular Junction: 38 mm ,unchanged Ascending Aorta: 37 mm ,unchanged Aortic Arch: 33 mm ,unchanged Descending aorta: 25 mm at the level of the carina ,unchanged Branch vessels: Conventional branching pattern. No significant atherosclerotic changes. Coronary arteries: Normal origins and courses. Three-vessel atherosclerotic calcifications. Main pulmonary artery: 37 mm ,unchanged. No evidence of central pulmonary embolism. Pulmonary veins: No anomalous pulmonary venous return. No evidence of left atrial appendage thrombus. Upper abdominal vasculature: Within normal limits. Mediastinum/Nodes: No enlarged mediastinal, hilar, or axillary lymph nodes. Thyroid  gland, trachea, and esophagus demonstrate no significant findings. Lungs/Pleura: Respiratory motion artifact limits fine parenchymal detail the lungs bilaterally. No focal consolidations. Slight interval enlargement of previously visualized solid pulmonary nodule in the right upper lobe measuring up to 5 mm, previously 4 mm (series 303, image 51). No pleural effusion or pneumothorax. Upper Abdomen: The visualized upper abdomen is within normal limits. Musculoskeletal: No chest wall abnormality. No acute or significant osseous findings. Review of the MIP images confirms the above findings. IMPRESSION: Vascular: 1. Unchanged mild prominence of the sinuses of Valsalva measuring up to 4.4 cm in maximum short axis dimension. 2. No evidence of ascending thoracic aortic aneurysm. 3. Coronary and aortic atherosclerosis (ICD10-I70.0). 4. Similar appearing prominence of the pulmonary arteries as could be seen with pulmonary hypertension. Non-Vascular: Slight interval enlargement of previously visualized right upper lobe solid pulmonary nodule, increased from 4 mm to 5 mm in maximum diameter. No follow-up needed if patient is low-risk.This recommendation  follows the consensus statement: Guidelines for Management of Incidental Pulmonary Nodules Detected on CT Images: From the Fleischner Society 2017; Radiology 2017; 284:228-243. Ester Sides, MD [facility] Radiology  Electronically Signed   By: Ester Sides M.D.   On: 08/28/2024 10:41    I had a telephone visit with [name]  Assessment:  73 y.o. male with a 4.4 cm ascending aortic aneurysm.  Echocardiogram shows a tricuspid valve without evidence of regurgitation.  We discussed the natural history and and risk factors for growth of ascending aortic aneurysms.  We covered the importance of smoking cessation, tight blood pressure control, refraining from lifting heavy objects, and avoiding fluoroquinolones.  The patient is aware of signs and symptoms of aortic dissection and when to present to the emergency department.  We will continue surveillance and a repeat CT was ordered for 12 months.  [name]

## 2024-09-26 ENCOUNTER — Ambulatory Visit: Admitting: Dietician

## 2024-09-29 ENCOUNTER — Encounter (HOSPITAL_COMMUNITY): Payer: Self-pay | Admitting: Internal Medicine

## 2024-09-29 ENCOUNTER — Inpatient Hospital Stay (HOSPITAL_COMMUNITY)
Admission: EM | Admit: 2024-09-29 | Discharge: 2024-10-05 | DRG: 378 | Disposition: A | Discharge status: Home / Self Care | Attending: Internal Medicine | Admitting: Internal Medicine

## 2024-09-29 ENCOUNTER — Emergency Department (HOSPITAL_COMMUNITY)

## 2024-09-29 ENCOUNTER — Other Ambulatory Visit: Payer: Self-pay

## 2024-09-29 DIAGNOSIS — Z7901 Long term (current) use of anticoagulants: Secondary | ICD-10-CM | POA: Diagnosis not present

## 2024-09-29 DIAGNOSIS — K5520 Angiodysplasia of colon without hemorrhage: Secondary | ICD-10-CM

## 2024-09-29 DIAGNOSIS — E611 Iron deficiency: Secondary | ICD-10-CM

## 2024-09-29 DIAGNOSIS — N1831 Chronic kidney disease, stage 3a: Secondary | ICD-10-CM | POA: Diagnosis present

## 2024-09-29 DIAGNOSIS — K31811 Angiodysplasia of stomach and duodenum with bleeding: Principal | ICD-10-CM | POA: Diagnosis present

## 2024-09-29 DIAGNOSIS — L03116 Cellulitis of left lower limb: Secondary | ICD-10-CM | POA: Diagnosis present

## 2024-09-29 DIAGNOSIS — Z6828 Body mass index (BMI) 28.0-28.9, adult: Secondary | ICD-10-CM

## 2024-09-29 DIAGNOSIS — E1169 Type 2 diabetes mellitus with other specified complication: Secondary | ICD-10-CM | POA: Diagnosis not present

## 2024-09-29 DIAGNOSIS — E8770 Fluid overload, unspecified: Secondary | ICD-10-CM | POA: Diagnosis present

## 2024-09-29 DIAGNOSIS — I129 Hypertensive chronic kidney disease with stage 1 through stage 4 chronic kidney disease, or unspecified chronic kidney disease: Secondary | ICD-10-CM | POA: Diagnosis present

## 2024-09-29 DIAGNOSIS — D649 Anemia, unspecified: Secondary | ICD-10-CM | POA: Diagnosis not present

## 2024-09-29 DIAGNOSIS — N183 Chronic kidney disease, stage 3 unspecified: Secondary | ICD-10-CM | POA: Diagnosis not present

## 2024-09-29 DIAGNOSIS — R933 Abnormal findings on diagnostic imaging of other parts of digestive tract: Secondary | ICD-10-CM | POA: Diagnosis not present

## 2024-09-29 DIAGNOSIS — E785 Hyperlipidemia, unspecified: Secondary | ICD-10-CM | POA: Diagnosis present

## 2024-09-29 DIAGNOSIS — K2960 Other gastritis without bleeding: Secondary | ICD-10-CM | POA: Diagnosis not present

## 2024-09-29 DIAGNOSIS — R195 Other fecal abnormalities: Secondary | ICD-10-CM | POA: Diagnosis not present

## 2024-09-29 DIAGNOSIS — I4891 Unspecified atrial fibrillation: Secondary | ICD-10-CM | POA: Diagnosis present

## 2024-09-29 DIAGNOSIS — Z87891 Personal history of nicotine dependence: Secondary | ICD-10-CM | POA: Diagnosis not present

## 2024-09-29 DIAGNOSIS — N401 Enlarged prostate with lower urinary tract symptoms: Secondary | ICD-10-CM | POA: Diagnosis present

## 2024-09-29 DIAGNOSIS — Z808 Family history of malignant neoplasm of other organs or systems: Secondary | ICD-10-CM | POA: Diagnosis not present

## 2024-09-29 DIAGNOSIS — Z8249 Family history of ischemic heart disease and other diseases of the circulatory system: Secondary | ICD-10-CM

## 2024-09-29 DIAGNOSIS — E669 Obesity, unspecified: Secondary | ICD-10-CM | POA: Diagnosis present

## 2024-09-29 DIAGNOSIS — N1832 Chronic kidney disease, stage 3b: Secondary | ICD-10-CM

## 2024-09-29 DIAGNOSIS — Z85038 Personal history of other malignant neoplasm of large intestine: Secondary | ICD-10-CM

## 2024-09-29 DIAGNOSIS — D509 Iron deficiency anemia, unspecified: Secondary | ICD-10-CM | POA: Diagnosis present

## 2024-09-29 DIAGNOSIS — E538 Deficiency of other specified B group vitamins: Secondary | ICD-10-CM | POA: Diagnosis present

## 2024-09-29 DIAGNOSIS — I452 Bifascicular block: Secondary | ICD-10-CM | POA: Diagnosis present

## 2024-09-29 DIAGNOSIS — K31819 Angiodysplasia of stomach and duodenum without bleeding: Secondary | ICD-10-CM | POA: Diagnosis not present

## 2024-09-29 DIAGNOSIS — D539 Nutritional anemia, unspecified: Secondary | ICD-10-CM | POA: Diagnosis present

## 2024-09-29 DIAGNOSIS — K2951 Unspecified chronic gastritis with bleeding: Secondary | ICD-10-CM | POA: Diagnosis present

## 2024-09-29 DIAGNOSIS — Z89512 Acquired absence of left leg below knee: Secondary | ICD-10-CM | POA: Diagnosis not present

## 2024-09-29 DIAGNOSIS — E1122 Type 2 diabetes mellitus with diabetic chronic kidney disease: Secondary | ICD-10-CM | POA: Diagnosis present

## 2024-09-29 DIAGNOSIS — D6859 Other primary thrombophilia: Secondary | ICD-10-CM

## 2024-09-29 DIAGNOSIS — Z7984 Long term (current) use of oral hypoglycemic drugs: Secondary | ICD-10-CM | POA: Diagnosis not present

## 2024-09-29 DIAGNOSIS — Z8616 Personal history of COVID-19: Secondary | ICD-10-CM | POA: Diagnosis not present

## 2024-09-29 DIAGNOSIS — I480 Paroxysmal atrial fibrillation: Secondary | ICD-10-CM

## 2024-09-29 DIAGNOSIS — K922 Gastrointestinal hemorrhage, unspecified: Secondary | ICD-10-CM

## 2024-09-29 DIAGNOSIS — Z820 Family history of epilepsy and other diseases of the nervous system: Secondary | ICD-10-CM

## 2024-09-29 DIAGNOSIS — D62 Acute posthemorrhagic anemia: Secondary | ICD-10-CM | POA: Diagnosis present

## 2024-09-29 DIAGNOSIS — Z9049 Acquired absence of other specified parts of digestive tract: Secondary | ICD-10-CM

## 2024-09-29 DIAGNOSIS — I5032 Chronic diastolic (congestive) heart failure: Secondary | ICD-10-CM | POA: Diagnosis not present

## 2024-09-29 DIAGNOSIS — W19XXXA Unspecified fall, initial encounter: Secondary | ICD-10-CM

## 2024-09-29 DIAGNOSIS — I890 Lymphedema, not elsewhere classified: Secondary | ICD-10-CM | POA: Diagnosis present

## 2024-09-29 DIAGNOSIS — K2289 Other specified disease of esophagus: Secondary | ICD-10-CM | POA: Diagnosis not present

## 2024-09-29 DIAGNOSIS — E119 Type 2 diabetes mellitus without complications: Secondary | ICD-10-CM

## 2024-09-29 DIAGNOSIS — I130 Hypertensive heart and chronic kidney disease with heart failure and stage 1 through stage 4 chronic kidney disease, or unspecified chronic kidney disease: Secondary | ICD-10-CM | POA: Diagnosis not present

## 2024-09-29 DIAGNOSIS — I10 Essential (primary) hypertension: Secondary | ICD-10-CM

## 2024-09-29 DIAGNOSIS — K2970 Gastritis, unspecified, without bleeding: Secondary | ICD-10-CM | POA: Diagnosis not present

## 2024-09-29 DIAGNOSIS — K2980 Duodenitis without bleeding: Secondary | ICD-10-CM | POA: Diagnosis not present

## 2024-09-29 DIAGNOSIS — Z79899 Other long term (current) drug therapy: Secondary | ICD-10-CM

## 2024-09-29 LAB — BASIC METABOLIC PANEL WITH GFR
Anion gap: 14 (ref 5–15)
BUN: 22 mg/dL (ref 8–23)
CO2: 25 mmol/L (ref 22–32)
Calcium: 8.3 mg/dL — ABNORMAL LOW (ref 8.9–10.3)
Chloride: 91 mmol/L — ABNORMAL LOW (ref 98–111)
Creatinine, Ser: 1.46 mg/dL — ABNORMAL HIGH (ref 0.61–1.24)
GFR, Estimated: 50 mL/min — ABNORMAL LOW (ref >60)
Glucose, Bld: 205 mg/dL — ABNORMAL HIGH (ref 70–99)
Potassium: 4.3 mmol/L (ref 3.5–5.1)
Sodium: 129 mmol/L — ABNORMAL LOW (ref 135–145)

## 2024-09-29 LAB — CBC WITH DIFFERENTIAL/PLATELET
Abs Immature Granulocytes: 0.13 K/uL — ABNORMAL HIGH (ref 0.00–0.07)
Basophils Absolute: 0 K/uL (ref 0.0–0.1)
Basophils Relative: 0 %
Eosinophils Absolute: 0 K/uL (ref 0.0–0.5)
Eosinophils Relative: 0 %
HCT: 21.8 % — ABNORMAL LOW (ref 39.0–52.0)
Hemoglobin: 6.5 g/dL — CL (ref 13.0–17.0)
Immature Granulocytes: 1 %
Lymphocytes Relative: 2 %
Lymphs Abs: 0.3 K/uL — ABNORMAL LOW (ref 0.7–4.0)
MCH: 31.9 pg (ref 26.0–34.0)
MCHC: 29.8 g/dL — ABNORMAL LOW (ref 30.0–36.0)
MCV: 106.9 fL — ABNORMAL HIGH (ref 80.0–100.0)
Monocytes Absolute: 0.2 K/uL (ref 0.1–1.0)
Monocytes Relative: 1 %
Neutro Abs: 13.9 K/uL — ABNORMAL HIGH (ref 1.7–7.7)
Neutrophils Relative %: 96 %
Platelets: 178 K/uL (ref 150–400)
RBC: 2.04 MIL/uL — ABNORMAL LOW (ref 4.22–5.81)
RDW: 17.4 % — ABNORMAL HIGH (ref 11.5–15.5)
WBC: 14.6 K/uL — ABNORMAL HIGH (ref 4.0–10.5)
nRBC: 0 % (ref 0.0–0.2)

## 2024-09-29 LAB — URINALYSIS, ROUTINE W REFLEX MICROSCOPIC
Bacteria, UA: NONE SEEN
Bilirubin Urine: NEGATIVE
Glucose, UA: 50 mg/dL — AB
Hgb urine dipstick: NEGATIVE
Ketones, ur: NEGATIVE mg/dL
Leukocytes,Ua: NEGATIVE
Nitrite: NEGATIVE
Protein, ur: 30 mg/dL — AB
Specific Gravity, Urine: 1.018 (ref 1.005–1.030)
pH: 7 (ref 5.0–8.0)

## 2024-09-29 LAB — GLUCOSE, CAPILLARY: Glucose-Capillary: 159 mg/dL — ABNORMAL HIGH (ref 70–99)

## 2024-09-29 LAB — PREPARE RBC (CROSSMATCH)

## 2024-09-29 LAB — POC OCCULT BLOOD, ED: Fecal Occult Bld: POSITIVE — AB

## 2024-09-29 LAB — CK: Total CK: 50 U/L (ref 49–397)

## 2024-09-29 MED ORDER — FUROSEMIDE 10 MG/ML IJ SOLN
40.0000 mg | Freq: Every day | INTRAMUSCULAR | Status: DC
  Administered 2024-09-30 – 2024-10-02 (×3): 40 mg via INTRAVENOUS
  Filled 2024-09-29 (×4): qty 4

## 2024-09-29 MED ORDER — PANTOPRAZOLE SODIUM 40 MG IV SOLR
40.0000 mg | Freq: Two times a day (BID) | INTRAVENOUS | Status: DC
  Administered 2024-09-29 – 2024-10-04 (×11): 40 mg via INTRAVENOUS
  Filled 2024-09-29 (×12): qty 10

## 2024-09-29 MED ORDER — TRAZODONE HCL 100 MG PO TABS
100.0000 mg | ORAL_TABLET | Freq: Every day | ORAL | Status: DC
  Administered 2024-09-29 – 2024-10-04 (×6): 100 mg via ORAL
  Filled 2024-09-29 (×6): qty 1

## 2024-09-29 MED ORDER — DOCUSATE SODIUM 100 MG PO CAPS: 200.0000 mg | ORAL_CAPSULE | Freq: Two times a day (BID) | ORAL | Status: DC | PRN

## 2024-09-29 MED ORDER — BISACODYL 5 MG PO TBEC
5.0000 mg | DELAYED_RELEASE_TABLET | Freq: Every day | ORAL | Status: DC | PRN
  Administered 2024-10-04: 5 mg via ORAL
  Filled 2024-09-29: qty 1

## 2024-09-29 MED ORDER — ONDANSETRON HCL 4 MG/2ML IJ SOLN: 4.0000 mg | Freq: Four times a day (QID) | INTRAMUSCULAR | Status: DC | PRN

## 2024-09-29 MED ORDER — ONDANSETRON HCL 4 MG PO TABS
4.0000 mg | ORAL_TABLET | Freq: Four times a day (QID) | ORAL | Status: DC | PRN
  Administered 2024-09-30: 4 mg via ORAL
  Filled 2024-09-29: qty 1

## 2024-09-29 MED ORDER — SODIUM CHLORIDE 0.9% IV SOLUTION: Freq: Once | INTRAVENOUS | Status: AC

## 2024-09-29 MED ORDER — FUROSEMIDE 10 MG/ML IJ SOLN
40.0000 mg | Freq: Once | INTRAMUSCULAR | Status: AC
  Administered 2024-09-29: 40 mg via INTRAVENOUS
  Filled 2024-09-29: qty 4

## 2024-09-29 MED ORDER — INSULIN ASPART 100 UNIT/ML IJ SOLN
0.0000 [IU] | Freq: Three times a day (TID) | INTRAMUSCULAR | Status: DC
  Administered 2024-09-29 – 2024-10-01 (×4): 1 [IU] via SUBCUTANEOUS
  Administered 2024-10-01: 3 [IU] via SUBCUTANEOUS
  Administered 2024-10-01: 1 [IU] via SUBCUTANEOUS
  Administered 2024-10-02: 2 [IU] via SUBCUTANEOUS
  Administered 2024-10-02: 1 [IU] via SUBCUTANEOUS
  Administered 2024-10-03: 2 [IU] via SUBCUTANEOUS

## 2024-09-29 NOTE — ED Triage Notes (Signed)
 BIBA from [facility] for a fall. Pt was found on the floor, unknown how long he was down. C/O generalized edema. Pt has left BKA. VSS.

## 2024-09-29 NOTE — H&P (Signed)
 History and Physical    Patient: [name] FMW:[id number] DOB: 25-Mar-1951 DOA: 09/29/2024 DOS: the patient was seen and examined on 09/29/2024 PCP: Arloa Jarvis, NP  Patient coming from: Home  Chief Complaint:  Chief Complaint  Patient presents with   Fall   HPI: [name] is a 73 y.o. male with medical history significant for 4.4 cm ascending aortic aneurysm, atrial fibrillation on Eliquis , CKD baseline Cr =1.3,BPH, hypertension, type 2 diabetes mellitus, left-sided AKA, chronic lymphedema, history of cecal cancer status post colectomy in 2017.  The patient does have history of GI bleed. He was hospitalized for this 5 months ago.  He had an EGD which revealed multiple gastric polyps but no clear cause of bleeding.  He was set up for IV iron infusions as an outpatient and his Eliquis  was resumed. He presents today after falling.  No loss of consciousness.  He was awake and trying to transfer from wheelchair on hs way to the commode.  The walker wheels got stuck with his wheelchair wheels and he ended up falling because he could not hold himself up long enough to get the wheels unstuck.  He did not lose consciousness.  When he was finally able to get help he was brought into the emergency department for evaluation. Basic workup revealed hemoglobin of 6.5. His last hemoglobin in the chart was 8.84 months ago.  He was guaiac positive with brown stool.  The patient was hemodynamically stable.  He will be admitted to the hospitalist team for further workup and management.  The ED physician also called [facility] to see him in consultation.     Review of Systems: As mentioned in the history of present illness. All other systems reviewed and are negative. Past Medical History:  Diagnosis Date   Adenocarcinoma of colon [facility])    Aortic root aneurysm    Atrial fibrillation (HCC)    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   Cataract    Constipation, chronic  11/09/2016   Diabetes mellitus without complication (HCC)    History of COVID-19 09/09/2022   History of homelessness 09/09/2022   History of kidney stones 09/09/2022   History of Stercoral ulcer of rectum 11/28/2016   Hyperlipidemia    Hypertension    Lichen planus    bilateral legs   Lymphedema    Nausea & vomiting 09/09/2022   Obesity (BMI 30-39.9) 09/09/2022   Personal history of cecal colon cancer 09/09/2022   Past Surgical History:  Procedure Laterality Date   AMPUTATION Left 11/03/2022   Procedure: BELOW KNEE  AMPUTATION, LEFT;  Surgeon: Harden Jerona GAILS, MD;  Location: [facility] OR;  Service: Orthopedics;  Laterality: Left;   APPENDECTOMY     APPLICATION OF WOUND VAC Left 11/03/2022   Procedure: APPLICATION OF WOUND VAC;  Surgeon: Harden Jerona GAILS, MD;  Location: [facility] OR;  Service: Orthopedics;  Laterality: Left;   BONE BIOPSY  04/22/2024   Procedure: BIOPSY, GI;  Surgeon: Avram Lupita BRAVO, MD;  Location: [facility];  Service: Gastroenterology;;   CHOLECYSTECTOMY N/A 09/11/2022   Procedure: LAPAROSCOPIC CHOLECYSTECTOMY;  Surgeon: Vernetta Berg, MD;  Location: [facility];  Service: General;  Laterality: N/A;   COLON RESECTION N/A 11/22/2016   Procedure: HAND ASSISTED LAPAROSCOPIC COLON RESECTION;  Surgeon: Krystal Russell, MD;  Location: [facility];  Service: General;  Laterality: N/A;   COLONOSCOPY N/A 11/17/2016   Procedure: COLONOSCOPY;  Surgeon: Gwendlyn ONEIDA Buddy, MD;  Location: [facility];  Service: Endoscopy;  Laterality: N/A;  COLONOSCOPY WITH PROPOFOL  N/A 08/29/2023   Procedure: COLONOSCOPY WITH PROPOFOL ;  Surgeon: Abran Norleen SAILOR, MD;  Location: [facility];  Service: Gastroenterology;  Laterality: N/A;   ESOPHAGOGASTRODUODENOSCOPY N/A 04/22/2024   Procedure: EGD (ESOPHAGOGASTRODUODENOSCOPY);  Surgeon: Avram Lupita BRAVO, MD;  Location: [facility];  Service: Gastroenterology;  Laterality: N/A;   FRACTURE SURGERY     MULTIPLE TOOTH EXTRACTIONS     RECTAL EXAM UNDER ANESTHESIA N/A  12/05/2016   Procedure: RECTAL EXAM UNDER ANESTHESIA, DISIMPACTION;  Surgeon: Alm Angle, MD;  Location: [facility];  Service: General;  Laterality: N/A;   Social History:  reports that he quit smoking about 41 years ago. His smoking use included pipe. He has never used smokeless tobacco. He reports that he does not drink alcohol  and does not use drugs.  No Known Allergies  Family History  Problem Relation Age of Onset   Brain cancer Mother    Alzheimer's disease Father    Cancer Maternal Grandmother    Heart attack Maternal Grandfather    Pneumonia Paternal Grandfather    Colon cancer Neg Hx    Esophageal cancer Neg Hx    Stomach cancer Neg Hx     Prior to Admission medications   Medication Sig Start Date End Date Taking? Authorizing Provider  acetaminophen  (TYLENOL ) 650 MG suppository Place 1 suppository (650 mg total) rectally every 6 (six) hours as needed for mild pain (pain score 1-3) or fever (or Fever >/= 101). 05/28/24   Will Almarie MATSU, MD  apixaban  (ELIQUIS ) 5 MG TABS tablet Take 1 tablet (5 mg total) by mouth 2 (two) times daily. 02/02/22   Walker, Caitlin S, NP  atorvastatin  (LIPITOR) 20 MG tablet Take 20 mg by mouth every evening. 02/02/21   [provider]  benzonatate (TESSALON) 100 MG capsule Take 100 mg by mouth 3 (three) times daily as needed for cough.    [provider]  Cholecalciferol  (VITAMIN D3) 125 MCG (5000 UT) CAPS Take 5,000 Units by mouth daily.    [provider]  cyanocobalamin  (VITAMIN B12) 1000 MCG tablet Take 1 tablet (1,000 mcg total) by mouth daily. 04/22/24   Barbarann Nest, MD  docusate sodium  (COLACE) 100 MG capsule Take 1 capsule (100 mg total) by mouth 2 (two) times daily. 12/08/16   Ricky Fines, MD  ferrous sulfate  (FEROSUL) 325 (65 FE) MG tablet Take 325 mg by mouth daily with breakfast.    [provider]  fluocinolone 0.01 % cream Apply 1 Application topically See admin instructions. Apply  to lower legs  2 times a day 05/04/23   [provider]  furosemide  (LASIX ) 40 MG tablet Take 1 tablet (40 mg total) by mouth daily. 05/29/24 05/29/25  Drusilla Sabas RAMAN, MD  glipiZIDE  (GLUCOTROL  XL) 10 MG 24 hr tablet Take 10 mg by mouth daily with breakfast.    [provider]  Glucagon (GVOKE HYPOPEN 1-PACK) 1 MG/0.2ML SOAJ Inject 1 mg into the skin as needed (for emergency of severe hypoglycemia).    [provider]  hydrocerin (EUCERIN) CREA Apply 1 Application topically 2 (two) times daily. Patient taking differently: Apply 1 Application topically See admin instructions. Apply to affected areas 2 times a day 04/22/24   Barbarann Nest, MD  hydrOXYzine  (ATARAX /VISTARIL ) 25 MG tablet Take 1 tablet (25 mg total) by mouth every 8 (eight) hours as needed for itching. Use caution as this medication may make you drowsy Patient taking differently: Take 25 mg by mouth every 6 (six) hours as needed for  itching. 07/17/20   Little, Vernell Search, MD  metFORMIN  (GLUCOPHAGE -XR) 500 MG 24 hr tablet Take 1,000 mg by mouth in the morning and at bedtime.    [provider]  mupirocin  ointment (BACTROBAN ) 2 % Apply topically 2 (two) times daily. Patient taking differently: Apply 1 Application topically See admin instructions. Apply to affected areas 2 times a day as a part of wound care 04/22/24   Barbarann Nest, MD  potassium chloride  (KLOR-CON  M) 10 MEQ tablet Take 1 tablet (10 mEq total) by mouth daily. 05/29/24   Drusilla Sabas RAMAN, MD  tamsulosin  (FLOMAX ) 0.4 MG CAPS capsule Take 1 capsule (0.4 mg total) by mouth daily. Patient taking differently: Take 0.4 mg by mouth every evening. 11/27/16   Ricky Fines, MD  traZODone  (DESYREL ) 100 MG tablet Take 100 mg by mouth at bedtime.    [provider]    Physical Exam: Vitals:   09/29/24 1220 09/29/24 1300 09/29/24 1400  BP: 122/61 128/79 123/61  Pulse: 81 85 80  Resp: 18 17 17   Temp: 97.8 F (36.6 C)    SpO2: 95% 91% 94%   Physical  Exam:  General: No acute distress, large man HEENT: Normocephalic, atraumatic, PERRL Cardiovascular: Normal rate and rhythm.  Pulmonary: Normal pulmonary effort, normal breath sounds Gastrointestinal: Nondistended abdomen, soft, non-tender, normoactive bowel sounds, Musculoskeletal:Normal ROM, bilateral lower ext edema, left BKA. Erythema and pitting swelling in left thigh.  Right calf is compression wrapped. Swelling in right thigh <left.  Lymphadenopathy: No cervical LAD. Skin: Skin is warm and dry. Neuro: No asymmetric deficits noted, diffusely weak , AAOx3. PSYCH: Attentive and cooperative  Data Reviewed:  Results for orders placed or performed during the hospital encounter of 09/29/24 (from the past 24 hours)  CBC with Differential     Status: Abnormal   Collection Time: 09/29/24  1:24 PM  Result Value Ref Range   WBC 14.6 (H) 4.0 - 10.5 K/uL   RBC 2.04 (L) 4.22 - 5.81 MIL/uL   Hemoglobin 6.5 (LL) 13.0 - 17.0 g/dL   HCT 78.1 (L) 60.9 - 47.9 %   MCV 106.9 (H) 80.0 - 100.0 fL   MCH 31.9 26.0 - 34.0 pg   MCHC 29.8 (L) 30.0 - 36.0 g/dL   RDW 82.5 (H) 88.4 - 84.4 %   Platelets 178 150 - 400 K/uL   nRBC 0.0 0.0 - 0.2 %   Neutrophils Relative % 96 %   Neutro Abs 13.9 (H) 1.7 - 7.7 K/uL   Lymphocytes Relative 2 %   Lymphs Abs 0.3 (L) 0.7 - 4.0 K/uL   Monocytes Relative 1 %   Monocytes Absolute 0.2 0.1 - 1.0 K/uL   Eosinophils Relative 0 %   Eosinophils Absolute 0.0 0.0 - 0.5 K/uL   Basophils Relative 0 %   Basophils Absolute 0.0 0.0 - 0.1 K/uL   Immature Granulocytes 1 %   Abs Immature Granulocytes 0.13 (H) 0.00 - 0.07 K/uL  Basic metabolic panel     Status: Abnormal   Collection Time: 09/29/24  1:24 PM  Result Value Ref Range   Sodium 129 (L) 135 - 145 mmol/L   Potassium 4.3 3.5 - 5.1 mmol/L   Chloride 91 (L) 98 - 111 mmol/L   CO2 25 22 - 32 mmol/L   Glucose, Bld 205 (H) 70 - 99 mg/dL   BUN 22 8 - 23 mg/dL   Creatinine, Ser 8.53 (H) 0.61 - 1.24 mg/dL   Calcium  8.3  (L) 8.9 - 10.3 mg/dL  GFR, Estimated 50 (L) >60 mL/min   Anion gap 14 5 - 15  CK     Status: None   Collection Time: 09/29/24  1:24 PM  Result Value Ref Range   Total CK 50 49 - 397 U/L  Urinalysis, Routine w reflex microscopic -Urine, Clean Catch     Status: Abnormal   Collection Time: 09/29/24  1:40 PM  Result Value Ref Range   Color, Urine YELLOW YELLOW   APPearance CLEAR CLEAR   Specific Gravity, Urine 1.018 1.005 - 1.030   pH 7.0 5.0 - 8.0   Glucose, UA 50 (A) NEGATIVE mg/dL   Hgb urine dipstick NEGATIVE NEGATIVE   Bilirubin Urine NEGATIVE NEGATIVE   Ketones, ur NEGATIVE NEGATIVE mg/dL   Protein, ur 30 (A) NEGATIVE mg/dL   Nitrite NEGATIVE NEGATIVE   Leukocytes,Ua NEGATIVE NEGATIVE   RBC / HPF 0-5 0 - 5 RBC/hpf   WBC, UA 0-5 0 - 5 WBC/hpf   Bacteria, UA NONE SEEN NONE SEEN   Squamous Epithelial / HPF 0-5 0 - 5 /HPF   Mucus PRESENT   POC occult blood, ED     Status: Abnormal   Collection Time: 09/29/24  2:35 PM  Result Value Ref Range   Fecal Occult Bld POSITIVE (A) NEGATIVE     Assessment and Plan: Acute blood loss anemia/GI bleed - Stop Eliquis  - Transfuse 2 untis prbcs - GI consulted - NPO  A-fib on Eliquis -   - Eliquis  will be held  CKD and volume overload -  - The patient will need diuresis as he is transfused - Follow creatinine  Lower extremity wounds /lymphedema - I did not remove the wrapping on his right lower ext. - Wound care    DMT2 -hold oral medications until the patient is eating.  Corrective dose insulin  in the meantime    Advance Care Planning:   Code Status: Prior  The patient names his friend Daril as his first contact he also has a sister in [place]  who can be called. He wants to be full code. Consults: [facility]  Family Communication: None  Severity of Illness: The appropriate patient status for this patient is INPATIENT. Inpatient status is judged to be reasonable and necessary in order to provide the required intensity of  service to ensure the patient's safety. The patient's presenting symptoms, physical exam findings, and initial radiographic and laboratory data in the context of their chronic comorbidities is felt to place them at high risk for further clinical deterioration. Furthermore, it is not anticipated that the patient will be medically stable for discharge from the hospital within 2 midnights of admission.   * I certify that at the point of admission it is my clinical judgment that the patient will require inpatient hospital care spanning beyond 2 midnights from the point of admission due to high intensity of service, high risk for further deterioration and high frequency of surveillance required.*  Author: ARTHEA CHILD, MD 09/29/2024 3:12 PM  For on call review [email or web address].

## 2024-09-29 NOTE — ED Provider Notes (Signed)
 [place] EMERGENCY DEPARTMENT AT [facility] Provider Note   CSN: [id number] Arrival date & time: 09/29/24  1202     Patient presents with: No chief complaint on file.   [name] is a 73 y.o. male.   The history is provided by the patient and medical records. No language interpreter was used.     73 year old male with history of atrial fibrillation currently on Eliquis , hypertension, lymphedema, diabetes, obesity, aortic root aneurysm, CKD brought here via EMS from nursing facility for evaluation of fall.  Patient states this morning around 4 AM he needed to go to the bathroom.  He normally walks with a walker and states he normally has trouble transferring himself from the bed to the chair and while he was in the process of transferring himself, his walker got locked up and he was unable to pivot in a timely manner.  His arms then gave out and patient fell to the ground.  He is unable to get up on his own and was laying on the ground for several hours before you can seek for help.  He denies hitting his head or loss of consciousness.  He mention he normally has difficulty with transferring himself due to lymphedema of his leg and also having a left BKA.  He denies any precipitating symptoms prior to his fall.  He denies any confusion.  Initially he was having pain about the right hip because he was laying on that side but after EMS arrived and sat him up, his pain has resolved.  He denies any significant pain at this time and he does not think he has any broken bone about his hip.  He denies any headache neck pain chest pain abdominal pain back pain.  He is compliant with his medication.  Prior to Admission medications   Medication Sig Start Date End Date Taking? Authorizing Provider  acetaminophen  (TYLENOL ) 650 MG suppository Place 1 suppository (650 mg total) rectally every 6 (six) hours as needed for mild pain (pain score 1-3) or fever (or Fever >/= 101). 05/28/24    Will Almarie MATSU, MD  apixaban  (ELIQUIS ) 5 MG TABS tablet Take 1 tablet (5 mg total) by mouth 2 (two) times daily. 02/02/22   Walker, Caitlin S, NP  atorvastatin  (LIPITOR) 20 MG tablet Take 20 mg by mouth every evening. 02/02/21   [provider]  benzonatate (TESSALON) 100 MG capsule Take 100 mg by mouth 3 (three) times daily as needed for cough.    [provider]  Cholecalciferol  (VITAMIN D3) 125 MCG (5000 UT) CAPS Take 5,000 Units by mouth daily.    [provider]  cyanocobalamin  (VITAMIN B12) 1000 MCG tablet Take 1 tablet (1,000 mcg total) by mouth daily. 04/22/24   Barbarann Nest, MD  docusate sodium  (COLACE) 100 MG capsule Take 1 capsule (100 mg total) by mouth 2 (two) times daily. 12/08/16   Ricky Fines, MD  ferrous sulfate  (FEROSUL) 325 (65 FE) MG tablet Take 325 mg by mouth daily with breakfast.    [provider]  fluocinolone 0.01 % cream Apply 1 Application topically See admin instructions. Apply  to lower legs 2 times a day 05/04/23   [provider]  furosemide  (LASIX ) 40 MG tablet Take 1 tablet (40 mg total) by mouth daily. 05/29/24 05/29/25  Drusilla Sabas RAMAN, MD  glipiZIDE  (GLUCOTROL  XL) 10 MG 24 hr tablet Take 10 mg by mouth daily with breakfast.    [provider]  Glucagon (GVOKE  HYPOPEN 1-PACK) 1 MG/0.2ML SOAJ Inject 1 mg into the skin as needed (for emergency of severe hypoglycemia).    [provider]  hydrocerin (EUCERIN) CREA Apply 1 Application topically 2 (two) times daily. Patient taking differently: Apply 1 Application topically See admin instructions. Apply to affected areas 2 times a day 04/22/24   Barbarann Nest, MD  hydrOXYzine  (ATARAX /VISTARIL ) 25 MG tablet Take 1 tablet (25 mg total) by mouth every 8 (eight) hours as needed for itching. Use caution as this medication may make you drowsy Patient taking differently: Take 25 mg by mouth every 6 (six) hours as needed for itching. 07/17/20   Little, Vernell Search, MD  metFORMIN  (GLUCOPHAGE -XR) 500 MG 24 hr tablet Take 1,000 mg by mouth in the morning and at bedtime.    [provider]  mupirocin  ointment (BACTROBAN ) 2 % Apply topically 2 (two) times daily. Patient taking differently: Apply 1 Application topically See admin instructions. Apply to affected areas 2 times a day as a part of wound care 04/22/24   Barbarann Nest, MD  potassium chloride  (KLOR-CON  M) 10 MEQ tablet Take 1 tablet (10 mEq total) by mouth daily. 05/29/24   Drusilla Sabas RAMAN, MD  tamsulosin  (FLOMAX ) 0.4 MG CAPS capsule Take 1 capsule (0.4 mg total) by mouth daily. Patient taking differently: Take 0.4 mg by mouth every evening. 11/27/16   Ricky Fines, MD  traZODone  (DESYREL ) 100 MG tablet Take 100 mg by mouth at bedtime.    [provider]    Allergies: Patient has no known allergies.    Review of Systems  All other systems reviewed and are negative.   Updated Vital Signs BP 123/61   Pulse 80   Temp 97.8 F (36.6 C)   Resp 17   SpO2 94%   Physical Exam Constitutional:      General: He is not in acute distress.    Appearance: He is well-developed. He is obese.  HENT:     Head: Atraumatic.  Eyes:     Conjunctiva/sclera: Conjunctivae normal.  Cardiovascular:     Rate and Rhythm: Normal rate and regular rhythm.     Pulses: Normal pulses.     Heart sounds: Normal heart sounds.  Pulmonary:     Effort: Pulmonary effort is normal.     Breath sounds: Normal breath sounds. No wheezing, rhonchi or rales.  Abdominal:     Palpations: Abdomen is soft.  Musculoskeletal:        General: Tenderness (Very mild tenderness noted to lateral right hip.) present.     Cervical back: Normal range of motion and neck supple.     Comments: No scalp tenderness no midline spine tenderness  Left BKA  Skin:    Findings: No rash.  Neurological:     Mental Status: He is alert and oriented to person, place, and time.     (all labs ordered are listed, but only  abnormal results are displayed) Labs Reviewed  CBC WITH DIFFERENTIAL/PLATELET - Abnormal; Notable for the following components:      Result Value   WBC 14.6 (*)    RBC 2.04 (*)    Hemoglobin 6.5 (*)    HCT 21.8 (*)    MCV 106.9 (*)    MCHC 29.8 (*)    RDW 17.4 (*)    Neutro Abs 13.9 (*)    Lymphs Abs 0.3 (*)    Abs Immature Granulocytes 0.13 (*)    All other components within normal limits  BASIC METABOLIC  PANEL WITH GFR - Abnormal; Notable for the following components:   Sodium 129 (*)    Chloride 91 (*)    Glucose, Bld 205 (*)    Creatinine, Ser 1.46 (*)    Calcium  8.3 (*)    GFR, Estimated 50 (*)    All other components within normal limits  URINALYSIS, ROUTINE W REFLEX MICROSCOPIC - Abnormal; Notable for the following components:   Glucose, UA 50 (*)    Protein, ur 30 (*)    All other components within normal limits  POC OCCULT BLOOD, ED - Abnormal; Notable for the following components:   Fecal Occult Bld POSITIVE (*)    All other components within normal limits  CK  TYPE AND SCREEN  PREPARE RBC (CROSSMATCH)    EKG: None ED ECG REPORT   Date: 09/29/2024  Rate: 75  Rhythm: atrial fibrillation  QRS Axis: left  Intervals: normal  ST/T Wave abnormalities: normal  Conduction Disutrbances:right bundle branch block and left anterior fascicular block  Narrative Interpretation:   Old EKG Reviewed: unchanged  I have personally reviewed the EKG tracing and agree with the computerized printout as noted.   Radiology: DG Hip Unilat W or Wo Pelvis 2-3 Views Right Result Date: 09/29/2024 CLINICAL DATA:  Fall with hip pain. EXAM: DG HIP (WITH OR WITHOUT PELVIS) 2-3V RIGHT COMPARISON:  None Available. FINDINGS: Bones are diffusely demineralized. No evidence for an acute fracture in the bony pelvis. AP and frog-leg lateral views of the right hip show no femoral neck fracture. IMPRESSION: Negative. Electronically Signed   By: Camellia Candle M.D.   On: 09/29/2024 13:21      .Critical Care  Performed by: Nivia Colon, PA-C Authorized by: Nivia Colon, PA-C   Critical care provider statement:    Critical care time (minutes):  30   Critical care was time spent personally by me on the following activities:  Development of treatment plan with patient or surrogate, discussions with consultants, evaluation of patient's response to treatment, examination of patient, ordering and review of laboratory studies, ordering and review of radiographic studies, ordering and performing treatments and interventions, pulse oximetry, re-evaluation of patient's condition and review of old charts    Medications Ordered in the ED  0.9 %  sodium chloride  infusion (Manually program via Guardrails IV Fluids) (has no administration in time range)  pantoprazole  (PROTONIX ) injection 40 mg (has no administration in time range)                                    Medical Decision Making Amount and/or Complexity of Data Reviewed Labs: ordered. Radiology: ordered. ECG/medicine tests: ordered.  Risk Prescription drug management.   BP 122/61   Pulse 81   Temp 97.8 F (36.6 C)   Resp 18   SpO2 95%   67:45 PM  73 year old male with history of atrial fibrillation currently on Eliquis , hypertension, lymphedema, diabetes, obesity, aortic root aneurysm, CKD brought here via EMS from nursing facility for evaluation of fall.  Patient states this morning around 4 AM he needed to go to the bathroom.  He normally walks with a walker and states he normally has trouble transferring himself from the bed to the chair and while he was in the process of transferring himself, his walker got locked up and he was unable to pivot in a timely manner.  His arms then gave out and patient fell to the ground.  He is unable to get up on his own and was laying on the ground for several hours before you can seek for help.  He denies hitting his head or loss of consciousness.  He mention he normally has  difficulty with transferring himself due to lymphedema of his leg and also having a left BKA.  He denies any precipitating symptoms prior to his fall.  He denies any confusion.  Initially he was having pain about the right hip because he was laying on that side but after EMS arrived and sat him up, his pain has resolved.  He denies any significant pain at this time and he does not think he has any broken bone about his hip.  He denies any headache neck pain chest pain abdominal pain back pain.  He is compliant with his medication.  On exam patient is resting comfortably in no acute discomfort.  He is alert and oriented x 4.  He has some minimal tenderness about the right hip but no deformity noted.  No other signs of injury noted.  He does have lymphedema involving his right lower extremity with multiple excoriation marks to his upper thigh.  He is wearing an Unna boot to his right foot as well as having a pressure dressing on his right lower extremity.  Given initial right hip pain, x-rays ordered.  Will check basic labs including CK since patient report being on the ground for several hours.  Pain medication offered but patient declined as he states he is not in any significant pain at this time.  -Labs ordered, independently viewed and interpreted by me.  Labs remarkable for a drop in his hemoglobin to 6.5.  It was 8.8 approximately 4 months ago.  Since patient is on Eliquis , I perform a digital rectal exam.  He has normal color stool on glove.  I do not see any other concerning findings to suggest ongoing active bleeding.  Sodium is low at 129, his creatinine is 1.46.  Fortunately his total CK is within normal limit at 50. -The patient was maintained on a cardiac monitor.  I personally viewed and interpreted the cardiac monitored which showed an underlying rhythm of: Atrial fibrillation -Imaging independently viewed and interpreted by me and I agree with radiologist's interpretation.  Result remarkable  for x-ray of right hip without any evidence of acute fracture or dislocation. -This patient presents to the ED for concern of fall, this involves an extensive number of treatment options, and is a complaint that carries with it a high risk of complications and morbidity.  The differential diagnosis includes mechanical fall, fracture, dislocation, contusion, strain, sprain, -Co morbidities that complicate the patient evaluation includes obesity, lymphedema, left BKA, A-fib, hypertension, diabetes -Treatment includes blood transfusion -Reevaluation of the patient after these medicines showed that the patient improved -PCP office notes or outside notes reviewed -Discussion with specialist [facility], who recommend PPI BID, NPO at midnight.  [place] Eliquis .  I have consulted [facility] Dr. Rand who agrees to admit pt -Escalation to admission/observation considered: patient is agreeable with admission.     Final diagnoses:  Symptomatic anemia  Fall, initial encounter  Gastrointestinal hemorrhage, unspecified gastrointestinal hemorrhage type    ED Discharge Orders     None          Nivia Colon, PA-C 09/29/24 1509    Allen Lot, MD 09/29/24 1527

## 2024-09-30 ENCOUNTER — Encounter (HOSPITAL_COMMUNITY)
Admission: EM | Disposition: A | Discharge status: Home / Self Care | Payer: Self-pay | Source: Home / Self Care | Attending: Internal Medicine

## 2024-09-30 DIAGNOSIS — Z7901 Long term (current) use of anticoagulants: Secondary | ICD-10-CM

## 2024-09-30 DIAGNOSIS — D539 Nutritional anemia, unspecified: Secondary | ICD-10-CM | POA: Diagnosis not present

## 2024-09-30 DIAGNOSIS — D509 Iron deficiency anemia, unspecified: Secondary | ICD-10-CM

## 2024-09-30 DIAGNOSIS — E538 Deficiency of other specified B group vitamins: Secondary | ICD-10-CM

## 2024-09-30 DIAGNOSIS — R195 Other fecal abnormalities: Secondary | ICD-10-CM | POA: Diagnosis not present

## 2024-09-30 DIAGNOSIS — D62 Acute posthemorrhagic anemia: Secondary | ICD-10-CM | POA: Diagnosis not present

## 2024-09-30 DIAGNOSIS — K2980 Duodenitis without bleeding: Secondary | ICD-10-CM

## 2024-09-30 DIAGNOSIS — K2970 Gastritis, unspecified, without bleeding: Secondary | ICD-10-CM

## 2024-09-30 HISTORY — PX: GIVENS CAPSULE STUDY: SHX5432

## 2024-09-30 LAB — BASIC METABOLIC PANEL WITH GFR
Anion gap: 12 (ref 5–15)
BUN: 23 mg/dL (ref 8–23)
CO2: 26 mmol/L (ref 22–32)
Calcium: 8.3 mg/dL — ABNORMAL LOW (ref 8.9–10.3)
Chloride: 93 mmol/L — ABNORMAL LOW (ref 98–111)
Creatinine, Ser: 1.5 mg/dL — ABNORMAL HIGH (ref 0.61–1.24)
GFR, Estimated: 49 mL/min — ABNORMAL LOW (ref >60)
Glucose, Bld: 159 mg/dL — ABNORMAL HIGH (ref 70–99)
Potassium: 3.8 mmol/L (ref 3.5–5.1)
Sodium: 131 mmol/L — ABNORMAL LOW (ref 135–145)

## 2024-09-30 LAB — CBC
HCT: 23 % — ABNORMAL LOW (ref 39.0–52.0)
Hemoglobin: 7 g/dL — ABNORMAL LOW (ref 13.0–17.0)
MCH: 31.4 pg (ref 26.0–34.0)
MCHC: 30.4 g/dL (ref 30.0–36.0)
MCV: 103.1 fL — ABNORMAL HIGH (ref 80.0–100.0)
Platelets: 156 K/uL (ref 150–400)
RBC: 2.23 MIL/uL — ABNORMAL LOW (ref 4.22–5.81)
RDW: 18.3 % — ABNORMAL HIGH (ref 11.5–15.5)
WBC: 11.3 K/uL — ABNORMAL HIGH (ref 4.0–10.5)
nRBC: 0 % (ref 0.0–0.2)

## 2024-09-30 LAB — IRON AND TIBC
Iron: 15 ug/dL — ABNORMAL LOW (ref 45–182)
Saturation Ratios: 4 % — ABNORMAL LOW (ref 17.9–39.5)
TIBC: 336 ug/dL (ref 250–450)
UIBC: 321 ug/dL

## 2024-09-30 LAB — HEMOGLOBIN AND HEMATOCRIT, BLOOD
HCT: 29.8 % — ABNORMAL LOW (ref 39.0–52.0)
Hemoglobin: 9 g/dL — ABNORMAL LOW (ref 13.0–17.0)

## 2024-09-30 LAB — VITAMIN B12: Vitamin B-12: 507 pg/mL (ref 180–914)

## 2024-09-30 LAB — FOLATE: Folate: >20 ng/mL (ref >5.9)

## 2024-09-30 LAB — FERRITIN: Ferritin: 127 ng/mL (ref 24–336)

## 2024-09-30 LAB — GLUCOSE, CAPILLARY
Glucose-Capillary: 156 mg/dL — ABNORMAL HIGH (ref 70–99)
Glucose-Capillary: 154 mg/dL — ABNORMAL HIGH (ref 70–99)
Glucose-Capillary: 100 mg/dL — ABNORMAL HIGH (ref 70–99)
Glucose-Capillary: 216 mg/dL — ABNORMAL HIGH (ref 70–99)

## 2024-09-30 SURGERY — IMAGING PROCEDURE, GI TRACT, INTRALUMINAL, VIA CAPSULE
Anesthesia: LOCAL

## 2024-09-30 MED ORDER — SIMETHICONE 40 MG/0.6ML PO SUSP
40.0000 mg | Freq: Once | ORAL | Status: AC
Start: 2024-09-30 — End: 2024-09-30
  Administered 2024-09-30: 40 mg via ORAL
  Filled 2024-09-30: qty 0.6

## 2024-09-30 MED ORDER — ACETAMINOPHEN 325 MG PO TABS
650.0000 mg | ORAL_TABLET | Freq: Four times a day (QID) | ORAL | Status: DC | PRN
  Administered 2024-09-30 – 2024-10-05 (×11): 650 mg via ORAL
  Filled 2024-09-30 (×11): qty 2

## 2024-09-30 MED ORDER — POLYETHYLENE GLYCOL 3350 17 GM/SCOOP PO POWD
119.0000 g | Freq: Once | ORAL | Status: AC
  Administered 2024-09-30: 119 g via ORAL
  Filled 2024-09-30: qty 119

## 2024-09-30 MED ORDER — SODIUM CHLORIDE 0.9 % IV SOLN: INTRAVENOUS | Status: AC

## 2024-09-30 MED ORDER — ACETAMINOPHEN 500 MG PO TABS
1000.0000 mg | ORAL_TABLET | Freq: Once | ORAL | Status: AC
  Administered 2024-09-30: 1000 mg via ORAL
  Filled 2024-09-30: qty 2

## 2024-09-30 MED ORDER — METOCLOPRAMIDE HCL 5 MG/ML IJ SOLN
10.0000 mg | Freq: Once | INTRAMUSCULAR | Status: AC
  Administered 2024-09-30: 10 mg via INTRAVENOUS
  Filled 2024-09-30: qty 2

## 2024-09-30 SURGICAL SUPPLY — 1 items: TOWEL COTTON PACK 4EA (MISCELLANEOUS) ×2 IMPLANT

## 2024-09-30 NOTE — Progress Notes (Signed)
 2nd unit PRBC complete, pt tolerated well. SRP, RN

## 2024-09-30 NOTE — Progress Notes (Signed)
 2nd unit PRBC given. SRP, RN

## 2024-09-30 NOTE — Progress Notes (Signed)
 MD notifed og elevated temp. orders recived. Pt c/o unability to void. Bladder scanner revel 534 cc. Discussed MD and he is aware

## 2024-09-30 NOTE — Progress Notes (Signed)
 Patient voided, soaked the bed, bath and bed change.

## 2024-09-30 NOTE — Consult Note (Signed)
 WOC Nurse Consult Note: Reason for Consult: Vascular wounds right leg  Wound type: venous stasis Patient with L BKA, history of lymphedema as well Pressure Injury POA: NA Measurement:scattered lesions; partial and full thickness over the RLE  Wound bed: all wounds are clean and pink Drainage (amount, consistency, odor) see nursing flow sheets Periwound:venous dermatitis  Dressing procedure/placement/frequency: Cleanse right leg wounds with saline, cover wounds with xeroform gauze, 4 x 4, ABD, Kerlix, and ACE wrap compression.   Re consult if needed, will not follow at this time. Thanks  Lexiana Sp[employer]el [employer], RN,CWOCN, CNS, [employer] [phone]

## 2024-09-30 NOTE — Plan of Care (Signed)
  Problem: Education: Goal: Knowledge of General Education information will improve Description: Including pain rating scale, medication(s)/side effects and non-pharmacologic comfort measures Outcome: Progressing   Problem: Health Behavior/Discharge Planning: Goal: Ability to manage health-related needs will improve Outcome: Progressing   Problem: Clinical Measurements: Goal: Ability to maintain clinical measurements within normal limits will improve Outcome: Progressing   Problem: Health Behavior/Discharge Planning: Goal: Ability to manage health-related needs will improve Outcome: Progressing

## 2024-09-30 NOTE — Plan of Care (Signed)
Cont with plan of care

## 2024-09-30 NOTE — Consult Note (Signed)
 Consultation  Referring Provider:     [facility] Primary Care Physician:  Arloa Jarvis, NP Primary Gastroenterologist:        Abran Reason for Consultation:     Heme positive, decreased hemoglobin/anemia     Impression / Plan:   Heme + stool and decreased Hgb   Chronic macrocytic anemia - was B 12 deficient earlier this year - also iron deficient and has had Feraheme  and is on B12 and oral iron  EGD May 2025 for same issue though there was question of melena then negative for cause of anemia including duodenal biopsies  History of T1 N0 M0 cecal cancer resected 2017 status post negative colonoscopy September 2024   Eliquis  therapy for atrial fibrillation last dose 10/17 PM   -------------------------------------------------------------------------------------------------------------------------------  He has a chronic anemia macrocytic with iron deficiency and B12 deficiency. Hemoglobin is decreased for unclear reasons.  He has brown heme positive stool and he takes Eliquis .  It is possible he has had significant GI bleeding but the heme positive stool could be from known internal hemorrhoids and not clinically relevant.  I have rechecked iron studies and B12 and folate though he has been transfused so they could be inaccurate I thought it was worthwhile.  He will ingest a capsule endoscopy of the small intestine later today and we will review that and determine any next steps.   Continue to hold Eliquis .  Depending upon the workup he could need elective hematology evaluation.     Allen CHARLENA Commander, MD, [facility] See TRACEY on call - gastroenterology for best contact person 09/30/2024 9:48 AM         HPI:   [name] is a 73 y.o. male with a history of atrial fibrillation on Eliquis , aortic root aneurysm, cecal cancer status post resection and status post left BKA, presenting with decreased hemoglobin and brown heme positive stool (per admitting  physician).  He had a previous workup as outlined above and below in May of this year and went on to receive Feraheme , oral iron and B12.  [facility] he fell at the nursing home, he says he was not lightheaded or dizzy but as he was trying to go to the bathroom early a.m.  his legs felt heavier than usual and he had difficulty moving his well with the walker and it locked up and he could not move and his arms gave way and he fell.  He was there for several hours he had some hip pain which abated he did not strike his head.  He was brought to the emergency department where his hemoglobin was noted to be decreased at 6.5 and it was 8.84 months ago.  I cannot find any other hemoglobin in the interim.  Hip film was negative for fracture he says he does get his B12 pills and his iron supplement also.  He has not noted any overt bleeding.  Sometimes his stools are dark on iron but also brown and they have been that way for years but they were brown and heme positive in the ED per documentation.  Denies abdominal pain.  He takes a generic MiraLAX  laxative daily and generally moves his bowels well.  He currently feels like he needs to defecate and is somewhat uncomfortable and is asking for a laxative.  There is no abdominal pain nausea vomiting dysphagia.  He is not on a PPI.  He does not use NSAIDs.   EGD 04/22/2024 - Multiple gastric polyps.  Biopsied. These are tiny almost nodular, likely clinically inconsequential. - Congested duodenal mucosa proximal D2. Biopsied. - The examination was otherwise normal. I did not see obvious atrophic gastritis. Biopsies of the normal-appearing antrum and the duodenal bulb were also taken  FINAL MICROSCOPIC DIAGNOSIS:   A. SECOND PART OF DUODENUM BIOPSY:  - Duodenal mucosa with focal foveolar metaplasia, otherwise unremarkable  - Negative for increased intraepithelial lymphocytes or villous  architectural changes   B. DUODENAL BULB BIOPSY:  - Duodenal mucosa with focal  foveolar metaplasia, otherwise unremarkable  - Negative for increased intraepithelial lymphocytes or villous  architectural changes   C. ANTRUM OF STOMACH BIOPSY:  - Gastric antral mucosa with mild chronic gastritis  - Helicobacter pylori-like organisms are not identified on routine HE  stain   D. GASTRIC BODY BIOPSY:  - Gastric oxyntic mucosa with no specific histopathologic changes  - Helicobacter pylori-like organisms are not identified on routine HE  stain     He has had 2 colonoscopies, one in 2017 when his cecal cancer was diagnosed, and another on 08/29/2023 by Dr. Abran, which showed left-sided diverticulosis but was otherwise normal status post right hemicolectomy.   He was admitted in June 2025 with cellulitis and had the studies.  He had had a parenteral Feraheme  infusion the day of admission on June 11 and then he had another on June 18. 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   Cataract    Constipation, chronic 11/09/2016   Diabetes mellitus without complication (HCC)    History of COVID-19 09/09/2022   History of homelessness 09/09/2022   History of kidney stones 09/09/2022   History of Stercoral ulcer of rectum 11/28/2016   Hyperlipidemia    Hypertension    Lichen planus    bilateral legs   Lymphedema    Nausea & vomiting 09/09/2022   Obesity (BMI 30-39.9) 09/09/2022   Personal history of cecal colon cancer 09/09/2022    Past Surgical History:  Procedure Laterality Date   AMPUTATION Left 11/03/2022   Procedure: BELOW KNEE  AMPUTATION, LEFT;  Surgeon: Harden Jerona GAILS,  MD;  Location[facility];  Service: Orthopedics;  Laterality: Left;   APPENDECTOMY     APPLICATION OF WOUND VAC Left 11/03/2022   Procedure: APPLICATION OF WOUND VAC;  Surgeon: Harden Jerona GAILS, MD;  Location: [facility] OR;  Service: Orthopedics;  Laterality: Left;   BONE BIOPSY  04/22/2024   Procedure: BIOPSY, GI;  Surgeon: Avram Allen BRAVO, MD;  Location: [facility];  Service: Gastroenterology;;   CHOLECYSTECTOMY N/A 09/11/2022   Procedure: LAPAROSCOPIC CHOLECYSTECTOMY;  Surgeon: Vernetta Berg, MD;  Location: [facility];  Service: General;  Laterality: N/A;   COLON RESECTION N/A 11/22/2016   Procedure: HAND ASSISTED LAPAROSCOPIC COLON RESECTION;  Surgeon: Krystal Russell, MD;  Location: [facility];  Service: General;  Laterality: N/A;   COLONOSCOPY N/A 11/17/2016   Procedure: COLONOSCOPY;  Surgeon: Gwendlyn ONEIDA Buddy, MD;  Location: [facility];  Service: Endoscopy;  Laterality: N/A;   COLONOSCOPY WITH PROPOFOL  N/A 08/29/2023   Procedure: COLONOSCOPY WITH  PROPOFOL ;  Surgeon: Abran Norleen SAILOR, MD;  Location: [facility];  Service: Gastroenterology;  Laterality: N/A;   ESOPHAGOGASTRODUODENOSCOPY N/A 04/22/2024   Procedure: EGD (ESOPHAGOGASTRODUODENOSCOPY);  Surgeon: Avram Allen BRAVO, MD;  Location: [facility];  Service: Gastroenterology;  Laterality: N/A;   FRACTURE SURGERY     MULTIPLE TOOTH EXTRACTIONS     RECTAL EXAM UNDER ANESTHESIA N/A 12/05/2016   Procedure: RECTAL EXAM UNDER ANESTHESIA, DISIMPACTION;  Surgeon: Alm Angle, MD;  Location: [facility];  Service: General;  Laterality: N/A;    Family History  Problem Relation Age of Onset   Brain cancer Mother    Alzheimer's disease Father    Cancer Maternal Grandmother    Heart attack Maternal Grandfather    Pneumonia Paternal Grandfather    Colon cancer Neg Hx    Esophageal cancer Neg Hx    Stomach cancer Neg Hx      Social History   Tobacco Use   Smoking status: Former    Types: Pipe    Quit date: 09/14/1983    Years since quitting: 41.0    Smokeless tobacco: Never  Vaping Use   Vaping status: Never Used  Substance Use Topics   Alcohol  use: No   Drug use: No    Prior to Admission medications   Medication Sig Start Date End Date Taking? Authorizing Provider  acetaminophen  (TYLENOL ) 650 MG suppository Place 1 suppository (650 mg total) rectally every 6 (six) hours as needed for mild pain (pain score 1-3) or fever (or Fever >/= 101). 05/28/24  Yes Will Almarie MATSU, MD  apixaban  (ELIQUIS ) 5 MG TABS tablet Take 1 tablet (5 mg total) by mouth 2 (two) times daily. 02/02/22  Yes Vannie Reche RAMAN, NP  atorvastatin  (LIPITOR) 20 MG tablet Take 20 mg by mouth every evening. 02/02/21  Yes [provider]  Benzethonium Chloride (DERMAL WOUND CLEANSER) 0.13 % LIQD Apply topically See admin instructions. Use as directed with wound care to right leg   Yes [provider]  cyanocobalamin  (VITAMIN B12) 1000 MCG tablet Take 1 tablet (1,000 mcg total) by mouth daily. 04/22/24  Yes Barbarann Nest, MD  docusate sodium  (COLACE) 100 MG capsule Take 1 capsule (100 mg total) by mouth 2 (two) times daily. Patient taking differently: Take 100 mg by mouth 2 (two) times daily as needed (Constipation). 12/08/16  Yes Ricky Fines, MD  ferrous sulfate  (FEROSUL) 325 (65 FE) MG tablet Take 325 mg by mouth daily with breakfast.   Yes [provider]  furosemide  (LASIX ) 80 MG tablet Take 80 mg by mouth daily. 09/25/24  Yes [provider]  Gauze Pads & Dressings (COMBINE PADS) 5X9 PADS by Does not apply route See admin instructions. Use as directed for wound care to right leg   Yes [provider]  glipiZIDE  (GLUCOTROL  XL) 10 MG 24 hr tablet Take 10 mg by mouth daily with breakfast.   Yes [provider]  Glucagon (GVOKE HYPOPEN 1-PACK) 1 MG/0.2ML SOAJ Inject 1 mg into the skin as needed (for emergency of severe hypoglycemia).   Yes [provider]  hydrOXYzine  (ATARAX /VISTARIL ) 25 MG tablet Take 1  tablet (25 mg total) by mouth every 8 (eight) hours as needed for itching. Use caution as this medication may make you drowsy Patient taking differently: Take 25 mg by mouth every 6 (six) hours as needed for itching. 07/17/20  Yes Little, Vernell Search, MD  metFORMIN  (GLUCOPHAGE -XR) 500 MG 24 hr tablet Take 1,000 mg by mouth in the morning and at  bedtime.   Yes [provider]  mupirocin  ointment (BACTROBAN ) 2 % Apply topically 2 (two) times daily. Patient taking differently: Apply 1 Application topically See admin instructions. Apply to affected areas 2 times a day as a part of wound care 04/22/24  Yes Barbarann Nest, MD  nystatin  (MYCOSTATIN /NYSTOP ) powder Apply 1 Application topically 2 (two) times daily. 08/21/24  Yes [provider]  polyethylene glycol (MIRALAX  / GLYCOLAX ) 17 g packet Take 17 g by mouth daily as needed (Constipation).   Yes [provider]  Potassium Chloride  ER 20 MEQ TBCR Take 1 tablet by mouth daily. 09/25/24  Yes [provider]  senna (SENOKOT) 8.6 MG TABS tablet Take 1 tablet by mouth every 12 (twelve) hours as needed for mild constipation.   Yes [provider]  tamsulosin  (FLOMAX ) 0.4 MG CAPS capsule Take 1 capsule (0.4 mg total) by mouth daily. Patient taking differently: Take 0.4 mg by mouth every evening. 11/27/16  Yes Ricky Fines, MD  traZODone  (DESYREL ) 100 MG tablet Take 100 mg by mouth at bedtime.   Yes [provider]  triamcinolone cream (KENALOG) 0.1 % Apply 1 Application topically as needed (Skin irritation). 06/21/24  Yes [provider]  benzonatate (TESSALON) 100 MG capsule Take 100 mg by mouth 3 (three) times daily as needed for cough. Patient not taking: Reported on 09/29/2024    [provider]  Cholecalciferol  (VITAMIN D3) 125 MCG (5000 UT) CAPS Take 5,000 Units by mouth daily. Patient not taking: Reported on 09/29/2024    [provider]  fluocinolone 0.01 % cream Apply 1  Application topically See admin instructions. Apply  to lower legs 2 times a day 05/04/23   [provider]  hydrocerin (EUCERIN) CREA Apply 1 Application topically 2 (two) times daily. Patient not taking: Reported on 09/29/2024 04/22/24   Barbarann Nest, MD    Current Facility-Administered Medications  Medication Dose Route Frequency Provider Last Rate Last Admin   bisacodyl  (DULCOLAX) EC tablet 5 mg  5 mg Oral Daily PRN Claiborne, Claudia, MD       docusate sodium  (COLACE) capsule 200 mg  200 mg Oral BID PRN Claiborne, Claudia, MD       furosemide  (LASIX ) injection 40 mg  40 mg Intravenous Daily Claiborne, Claudia, MD       insulin  aspart (novoLOG ) injection 0-6 Units  0-6 Units Subcutaneous TID WC Claiborne, Claudia, MD   1 Units at 09/29/24 2342   ondansetron  (ZOFRAN ) tablet 4 mg  4 mg Oral Q6H PRN Arthea Child, MD       Or   ondansetron  (ZOFRAN ) injection 4 mg  4 mg Intravenous Q6H PRN Arthea Child, MD       pantoprazole  (PROTONIX ) injection 40 mg  40 mg Intravenous Q12H Nivia Colon, PA-C   40 mg at 09/29/24 2342   polyethylene glycol powder (GLYCOLAX /MIRALAX ) container 119 g  119 g Oral Once Avram Allen BRAVO, MD       simethicone (MYLICON) 40 MG/0.6ML suspension 40 mg  40 mg Oral Once Avram Allen BRAVO, MD       traZODone  (DESYREL ) tablet 100 mg  100 mg Oral QHS Claiborne, Claudia, MD   100 mg at 09/29/24 2342    Allergies as of 09/29/2024   (No Known Allergies)     Review of Systems:    This is positive for those things mentioned in the HPI, also positive for chronic edema of the lower extremities.. All other review of systems are negative.  Physical Exam:  Vital signs in last 24 hours: Temp:  [97.8 F (36.6 C)-101.2 F (38.4 C)] 98.2 F (36.8 C) (10/19 0644) Pulse Rate:  [66-97] 69 (10/19 0644) Resp:  [12-24] 20 (10/19 0644) BP: (88-151)/(47-79) 111/61 (10/19 0644) SpO2:  [91 %-100 %] 97 % (10/19 0644) Weight:  [152.4 kg] 152.4 kg (10/18  1824) Last BM Date : 09/28/24  General:  Elderly obese white male in no acute distress  Eyes:  anicteric Lungs: Clear to auscultation bilaterally anterior Heart:   S1S2, no rubs, murmurs, gallops. Abdomen: Obese soft, non-tender, no hepatosplenomegaly, hernia, or mass and BS+.   Extremities:   There is a below the knee, just above the ankle amputation on the left lower extremity with 2+ edema warmth and erythema extending into the thigh (he says this is chronic with lymphedema).  The right lower extremity is wrapped from the knee to the foot in an [employer]. Skin  see extremities. Neuro:  A&O x 3.  Psych:  appropriate mood and  Affect.   Data Reviewed:   LAB RESULTS: Recent Labs    09/29/24 1324 09/30/24 0420 09/30/24 0857  WBC 14.6* 11.3*  --   HGB 6.5* 7.0* 9.0*  HCT 21.8* 23.0* 29.8*  PLT 178 156  --    BMET Recent Labs    09/29/24 1324 09/30/24 0420  NA 129* 131*  K 4.3 3.8  CL 91* 93*  CO2 25 26  GLUCOSE 205* 159*  BUN 22 23  CREATININE 1.46* 1.50*  CALCIUM  8.3* 8.3*     STUDIES: DG Hip Unilat W or Wo Pelvis 2-3 Views Right Result Date: 09/29/2024 CLINICAL DATA:  Fall with hip pain. EXAM: DG HIP (WITH OR WITHOUT PELVIS) 2-3V RIGHT COMPARISON:  None Available. FINDINGS: Bones are diffusely demineralized. No evidence for an acute fracture in the bony pelvis. AP and frog-leg lateral views of the right hip show no femoral neck fracture. IMPRESSION: Negative. Electronically Signed   By: Camellia Candle M.D.   On: 09/29/2024 13:21       Thanks   LOS: 1 day   @Tura Roller  CHARLENA Commander, MD, [facility] @  09/30/2024, 9:20 AM

## 2024-09-30 NOTE — Progress Notes (Signed)
 Pt presents with multiple areas of scattered vascular leg ulcers some areas opened with serosanguinous drainage, other areas are scattered dried scaly shedding skin. Cleansed entire right leg wounds with NS, Vashe/NS solution, applied impregnated Bismuth guaze, 4 x 4, ABD, Kerlix, and ACE wrap compression.   Pt states wound care RN home visits twice a week for wound care.

## 2024-09-30 NOTE — Progress Notes (Addendum)
 PROGRESS NOTE    [name]  FMW:[id number] DOB: Apr 14, 1951 DOA: 09/29/2024 PCP: Arloa Jarvis, NP   Brief Narrative:  [name] is a 73 y.o. male who presents after a mechanical fall/weakness -reports his walking aid got stuck which caused his fall.  He does note worsening weakness and intake is related be profoundly anemic from baseline.  He has a known medical history including 4.4 cm ascending aortic aneurysm, atrial fibrillation on Eliquis , CKD baseline Cr =1.3,BPH, hypertension, type 2 diabetes mellitus, left-sided AKA, chronic lymphedema, history of cecal cancer status post colectomy in 2017.  Assessment & Plan:   Principal Problem:   Acute blood loss anemia Active Problems:   DM2 (diabetes mellitus, type 2) (HCC)   Current use of long term anticoagulation   Essential hypertension, benign   CKD (chronic kidney disease) stage 3, GFR 30-59 ml/min (HCC)   GI bleed   Acute symptomatic blood loss anemia, rule out GI bleed  - Eliquis  on hold - 2 unit PRBC transfusing (baseline labile, 8-10 over the past year) 6.9 at intake - GI following, appreciate insight recommendations -current plan for pill endoscopy with possible colonoscopy thereafter - Advance diet per GI   A-fib on Eliquis , rate controlled - Eliquis  will be held given above CKD2 - Volume status posttransfusion, likely benefit from diuretics Lower extremity wounds /lymphedema, chronic - Wound care following, appreciate insight recommendations DMT2 -continue sliding scale insulin , hypoglycemic protocol  DVT prophylaxis: Early ambulation avoid chemical prophylaxis due to above Code Status:   Code Status: Full Code Family Communication: None present/available by phone  Status is: Patient  Dispo: The patient is from: Home              Anticipated d/c is to: Home              Anticipated d/c date is: 48-72 hours              Patient currently not medically stable for discharge  Consultants:  GI, wound  care  Procedures:  Pill endoscopy 09/30/2024  Antimicrobials:  None  Subjective: No acute issues or events overnight nausea vomiting diarrhea constipation headache fevers chills or chest pain  Objective: Vitals:   09/30/24 0412 09/30/24 0416 09/30/24 0445 09/30/24 0644  BP:  (!) 100/50 (!) 88/48 111/61  Pulse: 76 74 69 69  Resp:  20 (!) 23 20  Temp: 99 F (37.2 C) 99 F (37.2 C) 99.2 F (37.3 C) 98.2 F (36.8 C)  TempSrc: Oral  Oral Oral  SpO2: 94% 95% 95% 97%  Weight:      Height:        Intake/Output Summary (Last 24 hours) at 09/30/2024 0819 Last data filed at 09/30/2024 0648 Gross per 24 hour  Intake 712 ml  Output 950 ml  Net -238 ml   Filed Weights   09/29/24 1824  Weight: (!) 152.4 kg    Examination:  General:  Pleasantly resting in bed, No acute distress. HEENT:  Normocephalic atraumatic.  Sclerae nonicteric, noninjected.  Extraocular movements intact bilaterally. Neck:  Without mass or deformity.  Trachea is midline. Lungs:  Clear to auscultate bilaterally without rhonchi, wheeze, or rales. Heart:  Regular rate and rhythm.  Without murmurs, rubs, or gallops. Abdomen:  Soft, nontender, nondistended.  Without guarding or rebound. Extremities: Without cyanosis, clubbing, edema. R BKA. Skin:  Warm and dry, no erythema.  Data Reviewed: I have personally reviewed following labs and imaging studies  CBC: Recent Labs  Lab 09/29/24  1324 09/30/24 0420  WBC 14.6* 11.3*  NEUTROABS 13.9*  --   HGB 6.5* 7.0*  HCT 21.8* 23.0*  MCV 106.9* 103.1*  PLT 178 156   Basic Metabolic Panel: Recent Labs  Lab 09/29/24 1324 09/30/24 0420  NA 129* 131*  K 4.3 3.8  CL 91* 93*  CO2 25 26  GLUCOSE 205* 159*  BUN 22 23  CREATININE 1.46* 1.50*  CALCIUM  8.3* 8.3*   GFR: Estimated Creatinine Clearance: 69.7 mL/min (A) (by C-G formula based on SCr of 1.5 mg/dL (H)).  Cardiac Enzymes: Recent Labs  Lab 09/29/24 1324  CKTOTAL 50   CBG: Recent Labs  Lab  09/29/24 2122 09/30/24 0751  GLUCAP 159* 156*    No results found for this or any previous visit (from the past 240 hours).   Radiology Studies: DG Hip Unilat W or Wo Pelvis 2-3 Views Right Result Date: 09/29/2024 CLINICAL DATA:  Fall with hip pain. EXAM: DG HIP (WITH OR WITHOUT PELVIS) 2-3V RIGHT COMPARISON:  None Available. FINDINGS: Bones are diffusely demineralized. No evidence for an acute fracture in the bony pelvis. AP and frog-leg lateral views of the right hip show no femoral neck fracture. IMPRESSION: Negative. Electronically Signed   By: Camellia Candle M.D.   On: 09/29/2024 13:21   Scheduled Meds:  furosemide   40 mg Intravenous Daily   insulin  aspart  0-6 Units Subcutaneous TID WC   pantoprazole  (PROTONIX ) IV  40 mg Intravenous Q12H   traZODone   100 mg Oral QHS   Continuous Infusions:   LOS: 1 day    Time spent: 55 min    Allen JAYSON Montclair, DO [facility]  If 7PM-7AM, please contact night-coverage [email or web address]  09/30/2024, 8:19 AM

## 2024-10-01 ENCOUNTER — Encounter (HOSPITAL_COMMUNITY): Payer: Self-pay | Admitting: Internal Medicine

## 2024-10-01 DIAGNOSIS — D62 Acute posthemorrhagic anemia: Secondary | ICD-10-CM | POA: Diagnosis not present

## 2024-10-01 LAB — TYPE AND SCREEN
ABO/RH(D): O NEG
Antibody Screen: NEGATIVE
Unit division: 0
Unit division: 0
Unit division: 0

## 2024-10-01 LAB — CBC
HCT: 28.9 % — ABNORMAL LOW (ref 39.0–52.0)
Hemoglobin: 8.7 g/dL — ABNORMAL LOW (ref 13.0–17.0)
MCH: 31.4 pg (ref 26.0–34.0)
MCHC: 30.1 g/dL (ref 30.0–36.0)
MCV: 104.3 fL — ABNORMAL HIGH (ref 80.0–100.0)
Platelets: 148 K/uL — ABNORMAL LOW (ref 150–400)
RBC: 2.77 MIL/uL — ABNORMAL LOW (ref 4.22–5.81)
RDW: 18.5 % — ABNORMAL HIGH (ref 11.5–15.5)
WBC: 7.4 K/uL (ref 4.0–10.5)
nRBC: 0 % (ref 0.0–0.2)

## 2024-10-01 LAB — BPAM RBC
Blood Product Expiration Date: 202511242359
ISSUE DATE / TIME: 202510181736
ISSUE DATE / TIME: 202510181807
ISSUE DATE / TIME: 202510190422
ISSUE DATE / TIME: 202511232359
ISSUE DATE / TIME: 202511242359
Unit Type and Rh: 202511232359
Unit Type and Rh: 202511242359
Unit Type and Rh: 202511242359
Unit Type and Rh: 9500
Unit Type and Rh: 9500
Unit Type and Rh: 9500

## 2024-10-01 LAB — BASIC METABOLIC PANEL WITH GFR
Anion gap: 14 (ref 5–15)
BUN: 25 mg/dL — ABNORMAL HIGH (ref 8–23)
CO2: 25 mmol/L (ref 22–32)
Calcium: 8.4 mg/dL — ABNORMAL LOW (ref 8.9–10.3)
Chloride: 92 mmol/L — ABNORMAL LOW (ref 98–111)
Creatinine, Ser: 1.62 mg/dL — ABNORMAL HIGH (ref 0.61–1.24)
GFR, Estimated: 45 mL/min — ABNORMAL LOW (ref >60)
Glucose, Bld: 169 mg/dL — ABNORMAL HIGH (ref 70–99)
Potassium: 3.5 mmol/L (ref 3.5–5.1)
Sodium: 131 mmol/L — ABNORMAL LOW (ref 135–145)

## 2024-10-01 LAB — GLUCOSE, CAPILLARY
Glucose-Capillary: 167 mg/dL — ABNORMAL HIGH (ref 70–99)
Glucose-Capillary: 226 mg/dL — ABNORMAL HIGH (ref 70–99)
Glucose-Capillary: 195 mg/dL — ABNORMAL HIGH (ref 70–99)
Glucose-Capillary: 250 mg/dL — ABNORMAL HIGH (ref 70–99)

## 2024-10-01 MED ORDER — BISACODYL 5 MG PO TBEC
10.0000 mg | DELAYED_RELEASE_TABLET | Freq: Once | ORAL | Status: DC
  Filled 2024-10-01: qty 2

## 2024-10-01 MED ORDER — NA SULFATE-K SULFATE-MG SULF 17.5-3.13-1.6 GM/177ML PO SOLN
0.5000 | Freq: Once | ORAL | Status: AC
Start: 2024-10-01 — End: 2024-10-01
  Administered 2024-10-01: 177 mL via ORAL
  Filled 2024-10-01: qty 1

## 2024-10-01 MED ORDER — SODIUM CHLORIDE 0.9 % IV SOLN: INTRAVENOUS | Status: DC

## 2024-10-01 MED ORDER — SODIUM CHLORIDE 0.9 % IV SOLN: INTRAVENOUS | Status: AC

## 2024-10-01 MED ORDER — BISACODYL 5 MG PO TBEC
10.0000 mg | DELAYED_RELEASE_TABLET | Freq: Once | ORAL | Status: AC
  Administered 2024-10-01: 10 mg via ORAL
  Filled 2024-10-01: qty 2

## 2024-10-01 NOTE — Progress Notes (Signed)
 PROGRESS NOTE    REMER COUSE  FMW:[id number] DOB: 02/05/1951 DOA: 09/29/2024 PCP: Arloa Jarvis, NP   Brief Narrative:  [name] is a 73 y.o. male who presents after a mechanical fall/weakness -reports his walking aid got stuck which caused his fall.  He does note worsening weakness and intake is related be profoundly anemic from baseline.  He has a known medical history including 4.4 cm ascending aortic aneurysm, atrial fibrillation on Eliquis , CKD baseline Cr =1.3,BPH, hypertension, type 2 diabetes mellitus, left-sided AKA, chronic lymphedema, history of cecal cancer status post colectomy in 2017.  Assessment & Plan:   Principal Problem:   Acute blood loss anemia Active Problems:   DM2 (diabetes mellitus, type 2) (HCC)   Current use of long term anticoagulation   Essential hypertension, benign   CKD (chronic kidney disease) stage 3, GFR 30-59 ml/min (HCC)   GI bleed   Acute symptomatic blood loss anemia, rule out GI bleed On chronic anemia(mixed b12 and iron deficiency), POA  - Eliquis  on hold -last dose 10/17 - 2 unit PRBC transfused, hemoglobin 6.9 and intake, repeat today 8.7  -Hgb baseline labile, 8-10 over the past year - GI following, appreciate insight recommendations -current plan for pill endoscopy with possible colonoscopy thereafter pending findings - Advance diet per GI   A-fib on Eliquis , rate controlled - Eliquis  will be held given above CKD2 - Volume status posttransfusion, likely benefit from diuretics Lower extremity wounds /lymphedema, chronic - Wound care following, appreciate insight recommendations attempt to secure compression device for L stump from home given worsening edema while supine DMT2 -continue sliding scale insulin , hypoglycemic protocol  DVT prophylaxis: Early ambulation (SCDs of limited value given amputation) avoid chemical prophylaxis due to above Code Status:   Code Status: Full Code Family Communication: None  present/available by phone  Status is: Patient  Dispo: The patient is from: Home              Anticipated d/c is to: Home              Anticipated d/c date is: 48-72 hours              Patient currently not medically stable for discharge  Consultants:  GI, wound care  Procedures:  Pill endoscopy 09/30/2024  Antimicrobials:  None  Subjective: No acute issues or events overnight nausea vomiting diarrhea constipation headache fevers chills or chest pain  Objective: Vitals:   09/30/24 1451 09/30/24 1500 09/30/24 2116 10/01/24 0537  BP:  130/77 (!) 119/57 (!) 103/51  Pulse:  98 86 86  Resp:  20 20 20   Temp:  100 F (37.8 C) 100 F (37.8 C) 99.4 F (37.4 C)  TempSrc:   Oral Oral  SpO2:  100% 96% 92%  Weight: 103 kg     Height: 6' 3 (1.905 m)       Intake/Output Summary (Last 24 hours) at 10/01/2024 0800 Last data filed at 10/01/2024 0400 Gross per 24 hour  Intake 232.53 ml  Output --  Net 232.53 ml   Filed Weights   09/29/24 1824 09/30/24 1451  Weight: (!) 152.4 kg 103 kg    Examination:  General:  Pleasantly resting in bed, No acute distress. HEENT:  Normocephalic atraumatic.  Sclerae nonicteric, noninjected.  Extraocular movements intact bilaterally. Neck:  Without mass or deformity.  Trachea is midline. Lungs:  Clear to auscultate bilaterally without rhonchi, wheeze, or rales. Heart:  Regular rate and rhythm.  Without murmurs, rubs, or gallops.  Abdomen:  Soft, nontender, nondistended.  Without guarding or rebound. Extremities: Without cyanosis, clubbing, edema. R BKA. Skin:  Warm and dry, no erythema.  Data Reviewed: I have personally reviewed following labs and imaging studies  CBC: Recent Labs  Lab 09/29/24 1324 09/30/24 0420 09/30/24 0857 10/01/24 0328  WBC 14.6* 11.3*  --  7.4  NEUTROABS 13.9*  --   --   --   HGB 6.5* 7.0* 9.0* 8.7*  HCT 21.8* 23.0* 29.8* 28.9*  MCV 106.9* 103.1*  --  104.3*  PLT 178 156  --  148*   Basic Metabolic  Panel: Recent Labs  Lab 09/29/24 1324 09/30/24 0420 10/01/24 0328  NA 129* 131* 131*  K 4.3 3.8 3.5  CL 91* 93* 92*  CO2 25 26 25   GLUCOSE 205* 159* 169*  BUN 22 23 25*  CREATININE 1.46* 1.50* 1.62*  CALCIUM  8.3* 8.3* 8.4*   GFR: Estimated Creatinine Clearance: 52.8 mL/min (A) (by C-G formula based on SCr of 1.62 mg/dL (H)).  Cardiac Enzymes: Recent Labs  Lab 09/29/24 1324  CKTOTAL 50   CBG: Recent Labs  Lab 09/30/24 0751 09/30/24 1148 09/30/24 1600 09/30/24 2141 10/01/24 0743  GLUCAP 156* 154* 100* 216* 167*    No results found for this or any previous visit (from the past 240 hours).   Radiology Studies: DG Hip Unilat W or Wo Pelvis 2-3 Views Right Result Date: 09/29/2024 CLINICAL DATA:  Fall with hip pain. EXAM: DG HIP (WITH OR WITHOUT PELVIS) 2-3V RIGHT COMPARISON:  None Available. FINDINGS: Bones are diffusely demineralized. No evidence for an acute fracture in the bony pelvis. AP and frog-leg lateral views of the right hip show no femoral neck fracture. IMPRESSION: Negative. Electronically Signed   By: Camellia Candle M.D.   On: 09/29/2024 13:21   Scheduled Meds:  furosemide   40 mg Intravenous Daily   insulin  aspart  0-6 Units Subcutaneous TID WC   pantoprazole  (PROTONIX ) IV  40 mg Intravenous Q12H   traZODone   100 mg Oral QHS   Continuous Infusions:  sodium chloride  50 mL/hr at 09/30/24 1012     LOS: 2 days    Time spent: 55 min    Allen JAYSON Montclair, DO [facility]  If 7PM-7AM, please contact night-coverage [email or web address]  10/01/2024, 8:00 AM

## 2024-10-01 NOTE — TOC Initial Note (Signed)
 Transition of Care [facility]) - Initial/Assessment Note    Patient Details  Name: [name] MRN: [id number] Date of Birth: 1951/02/18  [facility]) CM/SW Contact:    Tawni CHRISTELLA Eva, LCSW Phone Number: 10/01/2024, 12:44 PM  Clinical Narrative:                 Pt is a resident of [facility]. CSW spoke with Hiyda with [facility], she reported that the facility will need an FL2 and to review pt's d/c summary before the pt can return. Care management to follow.    Expected Discharge Plan: Assisted Living Barriers to Discharge: Continued Medical Work up   Patient Goals and CMS Choice Patient states their goals for this hospitalization and ongoing recovery are:: retrun to ALF          Expected Discharge Plan and Services       Living arrangements for the past 2 months: Assisted Living Facility                                      Prior Living Arrangements/Services Living arrangements for the past 2 months: Assisted Living Facility Lives with:: Self Patient language and need for interpreter reviewed:: Yes Do you feel safe going back to the place where you live?: Yes            Criminal Activity/Legal Involvement Pertinent to Current Situation/Hospitalization: No - Comment as needed  Activities of Daily Living   ADL Screening (condition at time of admission) Independently performs ADLs?: Yes (appropriate for developmental age) Is the patient deaf or have difficulty hearing?: No Does the patient have difficulty seeing, even when wearing glasses/contacts?: No Does the patient have difficulty concentrating, remembering, or making decisions?: No  Permission Sought/Granted                  Emotional Assessment              Admission diagnosis:  GI bleed [K92.2] Fall, initial encounter [W19.XXXA] Symptomatic anemia [D64.9] Gastrointestinal hemorrhage, unspecified gastrointestinal hemorrhage type [K92.2] Patient Active  Problem List   Diagnosis Date Noted   Heme + stool 04/22/2024   Anemia due to vitamin B12 deficiency 04/22/2024   Acute blood loss anemia 04/21/2024   GI bleed 04/21/2024   Aortic root dilation 02/17/2024   Lymphedema 10/12/2023   Cellulitis 10/11/2023   Osteomyelitis of left foot (HCC) 10/30/2022   Cellulitis of left foot 10/27/2022   IDA (iron deficiency anemia) 09/10/2022   Chronic diastolic CHF (congestive heart failure) (HCC) 09/10/2022   CKD (chronic kidney disease) stage 2, GFR 60-89 ml/min 09/09/2022   Current use of long term anticoagulation 09/09/2022   History of colon cancer 09/09/2022   Anemia of chronic disease 09/09/2022   Venous stasis ulcers- BLE 09/09/2022   Acute cholecystitis due to biliary calculus 09/09/2022   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   Subacute eczematous & allergic contact dermatitis  09/09/2022   History of COVID-19 09/09/2022   History of homelessness 09/09/2022   Diverticulosis of colon 09/09/2022   Cholelithiasis 09/09/2022   Hepatic steatosis 09/09/2022   Living in assisted living 09/09/2022   Pain in right foot 05/06/2022   Atrial fibrillation with slow ventricular response (HCC) 12/23/2021   Pain of joint of left ankle and foot 02/26/2021   BPH (benign prostatic hyperplasia) 11/30/2016   Abnormal CT scan, colon  AKI (acute kidney injury)    Generalized weakness    Abdominal pain    Moderate protein-calorie malnutrition 11/10/2016   Constipation, chronic 11/09/2016   CKD (chronic kidney disease) stage 3, GFR 30-59 ml/min (HCC) 11/09/2016   Hyperbilirubinemia 11/09/2016   Hyponatremia 11/08/2016   RBBB 09/13/2013   Snoring 09/13/2013   Edema 09/07/2013   DM2 (diabetes mellitus, type 2) (HCC) 03/16/2013   Arthritis 03/16/2013   Essential hypertension, benign 02/23/2013   Morbid obesity (HCC) 02/23/2013   Paroxysmal atrial fibrillation (HCC) 02/23/2013   HLD (hyperlipidemia) 02/23/2013   PCP:   Arloa Jarvis, NP Pharmacy:   [facility] [place], [place] - [address] [place] [address] Phone: [phone] Fax: [phone]     Social Drivers of Health (SDOH) Social History: SDOH Screenings   Food Insecurity: No Food Insecurity (09/29/2024)  Housing: Low Risk  (09/29/2024)  Transportation Needs: No Transportation Needs (09/29/2024)  Utilities: Not At Risk (09/29/2024)  Depression (PHQ2-9): Low Risk  (05/23/2024)  Social Connections: Moderately Isolated (09/29/2024)  Tobacco Use: Medium Risk (09/29/2024)   SDOH Interventions:     Readmission Risk Interventions    05/24/2024    3:23 PM 12/21/2023   11:59 AM 10/14/2023    2:54 PM  Readmission Risk Prevention Plan  Transportation Screening Complete Complete Complete  PCP or Specialist Appt within 5-7 Days  Complete Complete  PCP or Specialist Appt within 3-5 Days Complete    Home Care Screening  Complete Complete  Medication Review (RN CM)  Complete Complete  Social Work Consult for Recovery Care Planning/Counseling Complete    Palliative Care Screening Not Applicable    Medication Review Oceanographer) Complete

## 2024-10-01 NOTE — Progress Notes (Cosign Needed)
 [facility] Progress Note  CC:   Heme positive, decreased hemoglobin/anemia   Subjective:  Feels good today.  Denies any pain.  Visiting with his pastor.  Had a large BM today that was reported as normal.  Objective:  Vital signs in last 24 hours: Temp:  [99.4 F (37.4 C)-103.1 F (39.5 C)] 99.4 F (37.4 C) (10/20 0537) Pulse Rate:  [86-98] 86 (10/20 0537) Resp:  [20] 20 (10/20 0537) BP: (103-130)/(51-99) 103/51 (10/20 0537) SpO2:  [92 %-100 %] 92 % (10/20 0537) Weight:  [103 kg] 103 kg (10/19 1451) Last BM Date : 10/01/24 General:  Alert, Well-developed, in NAD Heart:  Irregularly irregular. Pulm:  CTAB.  No W/R/R. Abdomen:  Soft, obese.  BS present.  Non-tender. Extremities:  Left BKA. Neurologic:  Alert and oriented x 4;  grossly normal neurologically. Psych:  Alert and cooperative. Normal mood and affect.  Intake/Output from previous day: 10/19 0701 - 10/20 0700 In: 232.5 [I.V.:232.5] Out: 800 [Urine:800]  Lab Results: Recent Labs    09/29/24 1324 09/30/24 0420 09/30/24 0857 10/01/24 0328  WBC 14.6* 11.3*  --  7.4  HGB 6.5* 7.0* 9.0* 8.7*  HCT 21.8* 23.0* 29.8* 28.9*  PLT 178 156  --  148*   BMET Recent Labs    09/29/24 1324 09/30/24 0420 10/01/24 0328  NA 129* 131* 131*  K 4.3 3.8 3.5  CL 91* 93* 92*  CO2 25 26 25   GLUCOSE 205* 159* 169*  BUN 22 23 25*  CREATININE 1.46* 1.50* 1.62*  CALCIUM  8.3* 8.3* 8.4*   Assessment / Plan: Heme + stool and decreased Hgb.  Hgb 8.7 grams today.  He actually had brown, heme negative stools by Dr. Darilyn assessment.   Chronic macrocytic anemia - was B12 deficient earlier this year - also iron deficient and has had Feraheme  and is on B12 and oral iron   EGD May 2025 for same issue though there was question of melena then negative for cause of anemia including duodenal biopsies   History of T1 N0 M0 cecal cancer resected 2017 status post negative colonoscopy September 2024 (only had  hemorrhoids and diverticulosis).   Eliquis  therapy for atrial fibrillation last dose 10/17 PM  -Await results of VCE. -Monitor Hgb and transfuse if needed.   LOS: 2 days   Harlene BIRCH. Zehr  10/01/2024, 9:07 AM     Attending Physician's Attestation   I have taken an interval history, reviewed the chart and examined the patient.   I have reviewed the patient's video capsule endoscopy.  Full report will be available in the coming days the procedure notes.  He had rapid transit through the stomach into the duodenum.  2 AVMs noted within the first 3 hours of being within the mall bowel.  Out of reach of small bowel enteroscope.  Then one half of the capsule had incomplete preparation such that lesions could have been missed.  This was a full capsule that made its way into the cecum.  Separate note will be available in the chart.  As such, it is possible that lesions could have been missed.  We offered the patient today consideration of repeating the video capsule as an inpatient versus as an outpatient.  Hemodynamically is stable without change in hemogram.  Anemia labs consistent with iron deficiency (even though patient already had received blood by the time his labs were drawn).  I think it makes sense to go ahead and repeat video capsule endoscopy.  It  is possible that he will need to use supportive measures with IV iron and oral iron if other AVMs are noted.  I am not sure I would start octreotide at this time.  Hematology evaluation may need to be considered.  He will get one half bowel preparation tonight and be n.p.o. at midnight and then have his video capsule endoscopy done tomorrow morning.  He is aware of the risks of capsule endoscopy in regards to retention (though thankfully did not have issues with this first capsule from the weekend).  The no other changes on his physical exam he is eating dinner this afternoon when he is seen.  All patient questions were answered to the best of my  ability, and the patient agrees to the aforementioned plan of action with follow-up as indicated.   I agree with the Advanced Practitioner's note, impression, and recommendations with updates and my documentation as noted above.  The majority of the medical decision making/process, formulation of the impression/plan of action for the patient were performed by me with substantive portion of this encounter (>50% time spent including complete performance of at least one of the key components of MDM, History, and/or Exam).   Aloha Finner, MD  Gastroenterology Advanced Endoscopy Office # [phone]

## 2024-10-02 ENCOUNTER — Encounter (HOSPITAL_COMMUNITY)
Admission: EM | Disposition: A | Discharge status: Home / Self Care | Payer: Self-pay | Source: Home / Self Care | Attending: Internal Medicine

## 2024-10-02 DIAGNOSIS — E611 Iron deficiency: Secondary | ICD-10-CM

## 2024-10-02 DIAGNOSIS — D649 Anemia, unspecified: Principal | ICD-10-CM

## 2024-10-02 DIAGNOSIS — K5520 Angiodysplasia of colon without hemorrhage: Secondary | ICD-10-CM

## 2024-10-02 DIAGNOSIS — R195 Other fecal abnormalities: Secondary | ICD-10-CM

## 2024-10-02 DIAGNOSIS — K31819 Angiodysplasia of stomach and duodenum without bleeding: Secondary | ICD-10-CM

## 2024-10-02 DIAGNOSIS — D62 Acute posthemorrhagic anemia: Secondary | ICD-10-CM | POA: Diagnosis not present

## 2024-10-02 DIAGNOSIS — R933 Abnormal findings on diagnostic imaging of other parts of digestive tract: Secondary | ICD-10-CM

## 2024-10-02 HISTORY — PX: GIVENS CAPSULE STUDY: SHX5432

## 2024-10-02 LAB — CBC
HCT: 25.9 % — ABNORMAL LOW (ref 39.0–52.0)
Hemoglobin: 7.8 g/dL — ABNORMAL LOW (ref 13.0–17.0)
MCH: 31.7 pg (ref 26.0–34.0)
MCHC: 30.1 g/dL (ref 30.0–36.0)
MCV: 105.3 fL — ABNORMAL HIGH (ref 80.0–100.0)
Platelets: 142 K/uL — ABNORMAL LOW (ref 150–400)
RBC: 2.46 MIL/uL — ABNORMAL LOW (ref 4.22–5.81)
RDW: 17.7 % — ABNORMAL HIGH (ref 11.5–15.5)
WBC: 8.3 K/uL (ref 4.0–10.5)
nRBC: 0 % (ref 0.0–0.2)

## 2024-10-02 LAB — BASIC METABOLIC PANEL WITH GFR
Anion gap: 12 (ref 5–15)
BUN: 24 mg/dL — ABNORMAL HIGH (ref 8–23)
CO2: 26 mmol/L (ref 22–32)
Calcium: 8.2 mg/dL — ABNORMAL LOW (ref 8.9–10.3)
Chloride: 93 mmol/L — ABNORMAL LOW (ref 98–111)
Creatinine, Ser: 1.44 mg/dL — ABNORMAL HIGH (ref 0.61–1.24)
GFR, Estimated: 51 mL/min — ABNORMAL LOW (ref >60)
Glucose, Bld: 186 mg/dL — ABNORMAL HIGH (ref 70–99)
Potassium: 3.5 mmol/L (ref 3.5–5.1)
Sodium: 131 mmol/L — ABNORMAL LOW (ref 135–145)

## 2024-10-02 LAB — GLUCOSE, CAPILLARY
Glucose-Capillary: 177 mg/dL — ABNORMAL HIGH (ref 70–99)
Glucose-Capillary: 210 mg/dL — ABNORMAL HIGH (ref 70–99)
Glucose-Capillary: 250 mg/dL — ABNORMAL HIGH (ref 70–99)
Glucose-Capillary: 288 mg/dL — ABNORMAL HIGH (ref 70–99)

## 2024-10-02 SURGERY — IMAGING PROCEDURE, GI TRACT, INTRALUMINAL, VIA CAPSULE
Anesthesia: LOCAL

## 2024-10-02 SURGICAL SUPPLY — 1 items: TOWEL COTTON PACK 4EA (MISCELLANEOUS) ×2 IMPLANT

## 2024-10-02 NOTE — Progress Notes (Signed)
 PROGRESS NOTE    [name]  FMW:[id number] DOB: 30-Jan-1951 DOA: 09/29/2024 PCP: Arloa Jarvis, NP   Brief Narrative:  [name] is a 73 y.o. male who presents after a mechanical fall/weakness -reports his walking aid got stuck which caused his fall.  He does note worsening weakness and intake is related be profoundly anemic from baseline.  He has a known medical history including 4.4 cm ascending aortic aneurysm, atrial fibrillation on Eliquis , CKD baseline Cr =1.3,BPH, hypertension, type 2 diabetes mellitus, left-sided AKA, chronic lymphedema, history of cecal cancer status post colectomy in 2017.  Assessment & Plan:   Principal Problem:   Acute blood loss anemia Active Problems:   DM2 (diabetes mellitus, type 2) (HCC)   Current use of long term anticoagulation   Essential hypertension, benign   CKD (chronic kidney disease) stage 3, GFR 30-59 ml/min (HCC)   GI bleed   AVM (arteriovenous malformation) of small bowel, acquired   Abnormal finding on GI tract imaging   Heme positive stool   Symptomatic anemia   Iron deficiency  Acute symptomatic blood loss anemia, rule out GI bleed On chronic anemia(mixed b12 and iron deficiency), POA  - Eliquis  on hold -last dose 10/17 - 2 unit PRBC transfused, hemoglobin 6.9 and intake, repeat today 7.8  -Hgb baseline labile, 8-10 over the past year - GI following, appreciate insight recommendations - repeat capsule endoscopy 10/02/2024 - Advance diet per GI   Atypical erythema/cellulitis left lower extremity  - Patient reports this is worsening after rolling in bed/questionable traumatic - Continue to follow clinically, to be outlined by nurse to ensure no rapid spread - Patient has distant history of shingles but this is not a vesicular rash, unlikely  A-fib on Eliquis , rate controlled - Eliquis  will be held given above CKD2 - Volume status posttransfusion, likely benefit from diuretics Lower extremity wounds /lymphedema,  chronic - Wound care following, appreciate insight recommendations attempt to secure compression device for L stump from home given worsening edema while supine DMT2 -continue sliding scale insulin , hypoglycemic protocol  DVT prophylaxis: Early ambulation (SCDs of limited value given amputation) avoid chemical prophylaxis due to above Code Status:   Code Status: Full Code Family Communication: None present/available by phone  Status is: Patient  Dispo: The patient is from: Home              Anticipated d/c is to: Home              Anticipated d/c date is: 48-72 hours              Patient currently not medically stable for discharge  Consultants:  GI, wound care  Procedures:  Pill endoscopy 09/30/2024  Antimicrobials:  None  Subjective: No acute issues or events overnight nausea vomiting diarrhea constipation headache fevers chills or chest pain  Objective: Vitals:   10/01/24 0537 10/01/24 1445 10/01/24 2126 10/02/24 0740  BP: (!) 103/51 (!) 117/58 129/60   Pulse: 86 82 76   Resp: 20 20 18    Temp: 99.4 F (37.4 C) 97.9 F (36.6 C) 98.8 F (37.1 C)   TempSrc: Oral Oral    SpO2: 92% 92% 94%   Weight:    103 kg  Height:    6' 3 (1.905 m)    Intake/Output Summary (Last 24 hours) at 10/02/2024 0819 Last data filed at 10/02/2024 0108 Gross per 24 hour  Intake 1200 ml  Output 1200 ml  Net 0 ml   [employer]  09/29/24 1824 09/30/24 1451 10/02/24 0740  Weight: (!) 152.4 kg 103 kg 103 kg    Examination:  General:  Pleasantly resting in bed, No acute distress. HEENT:  Normocephalic atraumatic.  Sclerae nonicteric, noninjected.  Extraocular movements intact bilaterally. Neck:  Without mass or deformity.  Trachea is midline. Lungs:  Clear to auscultate bilaterally without rhonchi, wheeze, or rales. Heart:  Regular rate and rhythm.  Without murmurs, rubs, or gallops. Abdomen:  Soft, nontender, nondistended.  Without guarding or rebound. Extremities: Without  cyanosis, clubbing, edema. L BKA. Skin:  Warm and dry, noted geographic appearing erythema of left lower extremity/stump  Data Reviewed: I have personally reviewed following labs and imaging studies  CBC: Recent Labs  Lab 09/29/24 1324 09/30/24 0420 09/30/24 0857 10/01/24 0328 10/02/24 0424  WBC 14.6* 11.3*  --  7.4 8.3  NEUTROABS 13.9*  --   --   --   --   HGB 6.5* 7.0* 9.0* 8.7* 7.8*  HCT 21.8* 23.0* 29.8* 28.9* 25.9*  MCV 106.9* 103.1*  --  104.3* 105.3*  PLT 178 156  --  148* 142*   Basic Metabolic Panel: Recent Labs  Lab 09/29/24 1324 09/30/24 0420 10/01/24 0328 10/02/24 0424  NA 129* 131* 131* 131*  K 4.3 3.8 3.5 3.5  CL 91* 93* 92* 93*  CO2 25 26 25 26   GLUCOSE 205* 159* 169* 186*  BUN 22 23 25* 24*  CREATININE 1.46* 1.50* 1.62* 1.44*  CALCIUM  8.3* 8.3* 8.4* 8.2*   GFR: Estimated Creatinine Clearance: 59.4 mL/min (A) (by C-G formula based on SCr of 1.44 mg/dL (H)).  Cardiac Enzymes: Recent Labs  Lab 09/29/24 1324  CKTOTAL 50   CBG: Recent Labs  Lab 10/01/24 0743 10/01/24 1135 10/01/24 1726 10/01/24 2124 10/02/24 0732  GLUCAP 167* 226* 195* 250* 177*    No results found for this or any previous visit (from the past 240 hours).   Radiology Studies: No results found.  Scheduled Meds:  bisacodyl   10 mg Oral Once   furosemide   40 mg Intravenous Daily   insulin  aspart  0-6 Units Subcutaneous TID WC   pantoprazole  (PROTONIX ) IV  40 mg Intravenous Q12H   traZODone   100 mg Oral QHS   Continuous Infusions:  sodium chloride        LOS: 3 days    Time spent: 55 min    [name], DO [facility]  If 7PM-7AM, please contact night-coverage [email or web address]  10/02/2024, 8:19 AM

## 2024-10-03 ENCOUNTER — Encounter (HOSPITAL_COMMUNITY): Payer: Self-pay | Admitting: Gastroenterology

## 2024-10-03 DIAGNOSIS — D649 Anemia, unspecified: Secondary | ICD-10-CM | POA: Diagnosis not present

## 2024-10-03 DIAGNOSIS — K2960 Other gastritis without bleeding: Secondary | ICD-10-CM

## 2024-10-03 DIAGNOSIS — K5520 Angiodysplasia of colon without hemorrhage: Secondary | ICD-10-CM | POA: Diagnosis not present

## 2024-10-03 DIAGNOSIS — R933 Abnormal findings on diagnostic imaging of other parts of digestive tract: Secondary | ICD-10-CM

## 2024-10-03 DIAGNOSIS — E611 Iron deficiency: Secondary | ICD-10-CM

## 2024-10-03 DIAGNOSIS — R195 Other fecal abnormalities: Secondary | ICD-10-CM | POA: Diagnosis not present

## 2024-10-03 LAB — CBC
HCT: 25.4 % — ABNORMAL LOW (ref 39.0–52.0)
Hemoglobin: 7.8 g/dL — ABNORMAL LOW (ref 13.0–17.0)
MCH: 31.6 pg (ref 26.0–34.0)
MCHC: 30.7 g/dL (ref 30.0–36.0)
MCV: 102.8 fL — ABNORMAL HIGH (ref 80.0–100.0)
Platelets: 135 K/uL — ABNORMAL LOW (ref 150–400)
RBC: 2.47 MIL/uL — ABNORMAL LOW (ref 4.22–5.81)
RDW: 17.5 % — ABNORMAL HIGH (ref 11.5–15.5)
WBC: 10.5 K/uL (ref 4.0–10.5)
nRBC: 0 % (ref 0.0–0.2)

## 2024-10-03 LAB — BASIC METABOLIC PANEL WITH GFR
Anion gap: 12 (ref 5–15)
BUN: 20 mg/dL (ref 8–23)
CO2: 26 mmol/L (ref 22–32)
Calcium: 8.1 mg/dL — ABNORMAL LOW (ref 8.9–10.3)
Chloride: 93 mmol/L — ABNORMAL LOW (ref 98–111)
Creatinine, Ser: 1.38 mg/dL — ABNORMAL HIGH (ref 0.61–1.24)
GFR, Estimated: 54 mL/min — ABNORMAL LOW (ref >60)
Glucose, Bld: 273 mg/dL — ABNORMAL HIGH (ref 70–99)
Potassium: 3.5 mmol/L (ref 3.5–5.1)
Sodium: 131 mmol/L — ABNORMAL LOW (ref 135–145)

## 2024-10-03 LAB — GLUCOSE, CAPILLARY
Glucose-Capillary: 224 mg/dL — ABNORMAL HIGH (ref 70–99)
Glucose-Capillary: 251 mg/dL — ABNORMAL HIGH (ref 70–99)
Glucose-Capillary: 194 mg/dL — ABNORMAL HIGH (ref 70–99)
Glucose-Capillary: 246 mg/dL — ABNORMAL HIGH (ref 70–99)

## 2024-10-03 MED ORDER — ATORVASTATIN CALCIUM 20 MG PO TABS
20.0000 mg | ORAL_TABLET | Freq: Every evening | ORAL | Status: DC
  Administered 2024-10-03 – 2024-10-04 (×2): 20 mg via ORAL
  Filled 2024-10-03 (×2): qty 1

## 2024-10-03 MED ORDER — INSULIN ASPART 100 UNIT/ML IJ SOLN
0.0000 [IU] | Freq: Three times a day (TID) | INTRAMUSCULAR | Status: DC
  Administered 2024-10-03: 8 [IU] via SUBCUTANEOUS
  Administered 2024-10-03 – 2024-10-04 (×2): 3 [IU] via SUBCUTANEOUS
  Administered 2024-10-04: 2 [IU] via SUBCUTANEOUS
  Administered 2024-10-04 – 2024-10-05 (×3): 5 [IU] via SUBCUTANEOUS

## 2024-10-03 MED ORDER — SODIUM CHLORIDE 0.9 % IV SOLN: INTRAVENOUS | Status: AC

## 2024-10-03 MED ORDER — TAMSULOSIN HCL 0.4 MG PO CAPS
0.4000 mg | ORAL_CAPSULE | Freq: Every evening | ORAL | Status: DC
Start: 2024-10-03 — End: 2024-10-05
  Administered 2024-10-03 – 2024-10-04 (×2): 0.4 mg via ORAL
  Filled 2024-10-03 (×2): qty 1

## 2024-10-03 MED ORDER — FUROSEMIDE 10 MG/ML IJ SOLN
40.0000 mg | Freq: Two times a day (BID) | INTRAMUSCULAR | Status: DC
  Administered 2024-10-03 – 2024-10-05 (×5): 40 mg via INTRAVENOUS
  Filled 2024-10-03 (×5): qty 4

## 2024-10-03 NOTE — Inpatient Diabetes Management (Signed)
 Inpatient Diabetes Program Recommendations  AACE/ADA: New Consensus Statement on Inpatient Glycemic Control (2015)  Target Ranges:  Prepandial:   less than 140 mg/dL      Peak postprandial:   less than 180 mg/dL (1-2 hours)      Critically ill patients:  140 - 180 mg/dL   Lab Results  Component Value Date   GLUCAP 224 (H) 10/03/2024   HGBA1C 7.0 (H) 12/20/2023    Review of Glycemic Control  Diabetes history: DM2 Outpatient Diabetes medications: glipizide  10 daily, metformin  1000 mg BID Current orders for Inpatient glycemic control: Novolog  0-6 TID  HgbA1C - 7.0% on 12/20/2023  Inpatient Diabetes Program Recommendations:    Please order HgbA1C - needs updating  Consider adding Novolog  2-3 units TID  Continue to follow.  Thank you. Shona Brandy, RD, LDN, CDCES Inpatient Diabetes Coordinator [phone]

## 2024-10-03 NOTE — H&P (View-Only) (Signed)
 [facility] Progress Note  CC:  Heme positive, decreased hemoglobin/anemia   Subjective:  Feels pretty good.  Having brown normal appearing stools.    Objective:  Vital signs in last 24 hours: Temp:  [97.9 F (36.6 C)-99.5 F (37.5 C)] 98.3 F (36.8 C) (10/22 1022) Pulse Rate:  [67-84] 67 (10/22 1022) Resp:  [20] 20 (10/22 1022) BP: (114-145)/(52-70) 145/53 (10/22 1022) SpO2:  [93 %-97 %] 97 % (10/22 1022) Last BM Date : 10/01/24 General:  Alert, Well-developed, in NAD Heart:  Regular rate and rhythm; no murmurs Pulm:  CTAB.  No W/R/R. Abdomen:  Soft, obese, non-distended.  BS present.  Non-tender. Extremities:  Left BKA.  Bruising on his left thigh. Neurologic:  Alert and oriented x 4;  grossly normal neurologically.  Intake/Output from previous day: 10/21 0701 - 10/22 0700 In: 480 [P.O.:480] Out: 2500 [Urine:2500]  Lab Results: Recent Labs    10/01/24 0328 10/02/24 0424 10/03/24 0430  WBC 7.4 8.3 10.5  HGB 8.7* 7.8* 7.8*  HCT 28.9* 25.9* 25.4*  PLT 148* 142* 135*   BMET Recent Labs    10/01/24 0328 10/02/24 0424 10/03/24 0430  NA 131* 131* 131*  K 3.5 3.5 3.5  CL 92* 93* 93*  CO2 25 26 26   GLUCOSE 169* 186* 273*  BUN 25* 24* 20  CREATININE 1.62* 1.44* 1.38*  CALCIUM  8.4* 8.2* 8.1*   Assessment / Plan: Heme + stool and decreased Hgb.  Hgb 7.8 grams today, stable from yesterday.  Received 2 units PRBCs this hospitalization.  First capsule incomplete due to stool, etc.  Repeat VCE performed yesterday.   Chronic macrocytic anemia - was B12 deficient earlier this year - also iron deficient and has had Feraheme  and is on B12 and oral iron   EGD May 2025 for same issue though there was question of melena then negative for cause of anemia including duodenal biopsies   History of T1 N0 M0 cecal cancer resected 2017 status post negative colonoscopy September 2024 (only had hemorrhoids and diverticulosis).   Eliquis  therapy for atrial  fibrillation last dose 10/17 PM   -Await results of VCE. -Monitor Hgb and transfuse further if needed.   LOS: 4 days   Harlene BIRCH. Zehr  10/03/2024, 10:56 AM     Attending Physician's Attestation   I have taken an interval history, reviewed the chart and examined the patient.   Patient remains hemodynamically stable at this time.  No overt GI bleeding noted.  I have formally reviewed his video capsule endoscopy from 10/21 today.  He had a slower gastric transit and on his stomach lining he was found to have erosive hemorrhagic gastritis without overt active bleeding but significant inflammation.  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  We are tentatively offering the patient a push enteroscopy tomorrow.  The other AVM were not in the reach of the push enteroscope or a single balloon enteroscopy so if in the future issues were to develop, would recommend double-balloon enteroscopy for attempt at reaching that.  This was a complete capsule passage.  Formal video capsule endoscopy report will be scanned into the chart.  The risks and benefits of endoscopic evaluation were discussed with the patient; these include but are not limited to the risk of perforation, infection, bleeding, missed lesions, lack of diagnosis, severe illness requiring hospitalization, as well as anesthesia and sedation related illnesses.  The patient and/or family is  agreeable to proceed.  Pending no issues from an anesthesia standpoint we will perform his push enteroscopy on 10/23 and hopefully he will be able to be discharged home soon.  He will need IV and oral iron supplementation as outpatient.  Hematology follow-up as outpatient will be critical.  I agree with the Advanced Practitioner's note, impression, and recommendations with updates and my documentation as noted above.  The majority of the medical decision making/process, formulation of the impression/plan of action  for the patient were performed by me with substantive portion of this encounter (>50% time spent including complete performance of at least one of the key components of MDM, History, and/or Exam).   Aloha Finner, MD [facility] Advanced Endoscopy Office # [phone]

## 2024-10-03 NOTE — Progress Notes (Cosign Needed)
 [facility] Progress Note  CC:  Heme positive, decreased hemoglobin/anemia   Subjective:  Feels pretty good.  Having brown normal appearing stools.    Objective:  Vital signs in last 24 hours: Temp:  [97.9 F (36.6 C)-99.5 F (37.5 C)] 98.3 F (36.8 C) (10/22 1022) Pulse Rate:  [67-84] 67 (10/22 1022) Resp:  [20] 20 (10/22 1022) BP: (114-145)/(52-70) 145/53 (10/22 1022) SpO2:  [93 %-97 %] 97 % (10/22 1022) Last BM Date : 10/01/24 General:  Alert, Well-developed, in NAD Heart:  Regular rate and rhythm; no murmurs Pulm:  CTAB.  No W/R/R. Abdomen:  Soft, obese, non-distended.  BS present.  Non-tender. Extremities:  Left BKA.  Bruising on his left thigh. Neurologic:  Alert and oriented x 4;  grossly normal neurologically.  Intake/Output from previous day: 10/21 0701 - 10/22 0700 In: 480 [P.O.:480] Out: 2500 [Urine:2500]  Lab Results: Recent Labs    10/01/24 0328 10/02/24 0424 10/03/24 0430  WBC 7.4 8.3 10.5  HGB 8.7* 7.8* 7.8*  HCT 28.9* 25.9* 25.4*  PLT 148* 142* 135*   BMET Recent Labs    10/01/24 0328 10/02/24 0424 10/03/24 0430  NA 131* 131* 131*  K 3.5 3.5 3.5  CL 92* 93* 93*  CO2 25 26 26   GLUCOSE 169* 186* 273*  BUN 25* 24* 20  CREATININE 1.62* 1.44* 1.38*  CALCIUM  8.4* 8.2* 8.1*   Assessment / Plan: Heme + stool and decreased Hgb.  Hgb 7.8 grams today, stable from yesterday.  Received 2 units PRBCs this hospitalization.  First capsule incomplete due to stool, etc.  Repeat VCE performed yesterday.   Chronic macrocytic anemia - was B12 deficient earlier this year - also iron deficient and has had Feraheme  and is on B12 and oral iron   EGD May 2025 for same issue though there was question of melena then negative for cause of anemia including duodenal biopsies   History of T1 N0 M0 cecal cancer resected 2017 status post negative colonoscopy September 2024 (only had hemorrhoids and diverticulosis).   Eliquis  therapy for atrial  fibrillation last dose 10/17 PM   -Await results of VCE. -Monitor Hgb and transfuse further if needed.   LOS: 4 days   Harlene BIRCH. Zehr  10/03/2024, 10:56 AM     Attending Physician's Attestation   I have taken an interval history, reviewed the chart and examined the patient.   Patient remains hemodynamically stable at this time.  No overt GI bleeding noted.  I have formally reviewed his video capsule endoscopy from 10/21 today.  He had a slower gastric transit and on his stomach lining he was found to have erosive hemorrhagic gastritis without overt active bleeding but significant inflammation.  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  We are tentatively offering the patient a push enteroscopy tomorrow.  The other AVM were not in the reach of the push enteroscope or a single balloon enteroscopy so if in the future issues were to develop, would recommend double-balloon enteroscopy for attempt at reaching that.  This was a complete capsule passage.  Formal video capsule endoscopy report will be scanned into the chart.  The risks and benefits of endoscopic evaluation were discussed with the patient; these include but are not limited to the risk of perforation, infection, bleeding, missed lesions, lack of diagnosis, severe illness requiring hospitalization, as well as anesthesia and sedation related illnesses.  The patient and/or family is  [facility] Progress Note  CC:  Heme positive, decreased hemoglobin/anemia   Subjective:  Feels pretty good.  Having brown normal appearing stools.    Objective:  Vital signs in last 24 hours: Temp:  [97.9 F (36.6 C)-99.5 F (37.5 C)] 98.3 F (36.8 C) (10/22 1022) Pulse Rate:  [67-84] 67 (10/22 1022) Resp:  [20] 20 (10/22 1022) BP: (114-145)/(52-70) 145/53 (10/22 1022) SpO2:  [93 %-97 %] 97 % (10/22 1022) Last BM Date : 10/01/24 General:  Alert, Well-developed, in NAD Heart:  Regular rate and rhythm; no murmurs Pulm:  CTAB.  No W/R/R. Abdomen:  Soft, obese, non-distended.  BS present.  Non-tender. Extremities:  Left BKA.  Bruising on his left thigh. Neurologic:  Alert and oriented x 4;  grossly normal neurologically.  Intake/Output from previous day: 10/21 0701 - 10/22 0700 In: 480 [P.O.:480] Out: 2500 [Urine:2500]  Lab Results: Recent Labs    10/01/24 0328 10/02/24 0424 10/03/24 0430  WBC 7.4 8.3 10.5  HGB 8.7* 7.8* 7.8*  HCT 28.9* 25.9* 25.4*  PLT 148* 142* 135*   BMET Recent Labs    10/01/24 0328 10/02/24 0424 10/03/24 0430  NA 131* 131* 131*  K 3.5 3.5 3.5  CL 92* 93* 93*  CO2 25 26 26   GLUCOSE 169* 186* 273*  BUN 25* 24* 20  CREATININE 1.62* 1.44* 1.38*  CALCIUM  8.4* 8.2* 8.1*   Assessment / Plan: Heme + stool and decreased Hgb.  Hgb 7.8 grams today, stable from yesterday.  Received 2 units PRBCs this hospitalization.  First capsule incomplete due to stool, etc.  Repeat VCE performed yesterday.   Chronic macrocytic anemia - was B12 deficient earlier this year - also iron deficient and has had Feraheme  and is on B12 and oral iron   EGD May 2025 for same issue though there was question of melena then negative for cause of anemia including duodenal biopsies   History of T1 N0 M0 cecal cancer resected 2017 status post negative colonoscopy September 2024 (only had hemorrhoids and diverticulosis).   Eliquis  therapy for atrial  fibrillation last dose 10/17 PM   -Await results of VCE. -Monitor Hgb and transfuse further if needed.   LOS: 4 days   Harlene BIRCH. Zehr  10/03/2024, 10:56 AM

## 2024-10-03 NOTE — Plan of Care (Signed)
   Problem: Education: Goal: Knowledge of General Education information will improve Description: Including pain rating scale, medication(s)/side effects and non-pharmacologic comfort measures Outcome: Progressing   Problem: Clinical Measurements: Goal: Ability to maintain clinical measurements within normal limits will improve Outcome: Progressing

## 2024-10-03 NOTE — Evaluation (Signed)
 Physical Therapy Evaluation Patient Details Name: [name] MRN: [id number] DOB: 13-Apr-1951 Today's Date: 10/03/2024  History of Present Illness  Pt is a 73 y/o M admitted on 09/29/24 after presenting with c/o weakness & a fall. Pt found to have acute symptomatic blood loss anemia. PMH:AAA, a-fib on Eliquis , CKD, HTN, DM2, L AKA, chronic lymphedema, cecal CA s/p colectomy (2017)  Clinical Impression  Pt seen for PT evaluation with pt agreeable. Pt reports prior to admission he was residing in ALF, able to complete bed mobility with mod I with trapeze bar & hospital bed features, stands for max of 20 seconds with RW & uses that time to pivot to w/c (has 4 levels of cushion in w/c seat to elevate height & has to transfer from elevated EOB). Pt relies mostly on BUE strength vs RLE strength during standing. On this date, pt is able to come to sitting EOB but unable to scoot forward to place RLE on floor 2/2 scrotal discomfort from purewick (feels he can mobilize better if it was off but does need it for voiding at this time). Recommend ongoing PT services to progress bed mobility & transfers as able. Recommend post acute rehab <3 hours therapy/day upon d/c.        If plan is discharge home, recommend the following: A lot of help with walking and/or transfers;A lot of help with bathing/dressing/bathroom   Can travel by private vehicle   No    Equipment Recommendations Other (comment) (defer to next venue)  Recommendations for Other Services  Rehab consult    Functional Status Assessment Patient has had a recent decline in their functional status and demonstrates the ability to make significant improvements in function in a reasonable and predictable amount of time.     Precautions / Restrictions Precautions Precautions: Fall Precaution/Restrictions Comments: L AKA Restrictions Weight Bearing Restrictions Per Provider Order: No      Mobility  Bed Mobility Overal bed mobility:  Needs Assistance Bed Mobility: Supine to Sit, Sit to Supine     Supine to sit: Min assist, HOB elevated, Used rails Sit to supine: Supervision, HOB elevated, Used rails   General bed mobility comments: Pt initially attempted supine>sit to exit L side of bed but reports he typically exits R side of bed, using moment & RLE over EOB to assist him to come to upright sitting so transitioned to trying to sit EOB on R side. HOB elevated, pt requires min assist with pt able to upright trunk but unable to place RLE flat on floor 2/2 scrotal discomfort due to purewick. Pt sat EOB <1 minute before returning to bed.    Transfers                        Ambulation/Gait                  Stairs            Wheelchair Mobility     Tilt Bed    Modified Rankin (Stroke Patients Only)       Balance Overall balance assessment: Needs assistance Sitting-balance support: Feet unsupported, Bilateral upper extremity supported Sitting balance-Leahy Scale: Fair                                       Pertinent Vitals/Pain Pain Assessment Pain Assessment: Faces Faces Pain Scale: Hurts even more  Pain Location: scrotum 2/2 purewick during bed mobility Pain Descriptors / Indicators: Discomfort Pain Intervention(s): Limited activity within patient's tolerance, Monitored during session, Repositioned    Home Living Family/patient expects to be discharged to:: Assisted living                 Home Equipment: Wheelchair - Forensic psychologist (2 wheels);Hospital bed (trabpeze bar)      Prior Function               Mobility Comments: Sleeps in hospital bed, does not have a prosthetic, transfers bed<>w/c via stand pivot with walker, transfers from elevated EOB (2/2 no leg strength), fell during transfer thus leading to this admission. Propels w/c with BUE. Pt reports he can stand for a max of ~20 seconds & uses that time to pivot to w/c. ADLs Comments:  assistance for washing back, dresses himself (only wears shorts vs long pants)     Extremity/Trunk Assessment   Upper Extremity Assessment Upper Extremity Assessment: Generalized weakness    Lower Extremity Assessment Lower Extremity Assessment: Generalized weakness (L AKA, BLE edema, hx of lymphedema, LLE redness)       Communication   Communication Communication: No apparent difficulties    Cognition Arousal: Alert Behavior During Therapy: WFL for tasks assessed/performed   PT - Cognitive impairments: No apparent impairments                       PT - Cognition Comments: verbose Following commands: Intact       Cueing Cueing Techniques: Verbal cues     General Comments General comments (skin integrity, edema, etc.): HR 85 bpm, SPO2 93% on room air    Exercises     Assessment/Plan    PT Assessment Patient needs continued PT services  PT Problem List Decreased strength;Cardiopulmonary status limiting activity;Pain;Decreased activity tolerance;Decreased balance;Decreased mobility;Decreased knowledge of precautions;Decreased skin integrity;Decreased knowledge of use of DME;Decreased range of motion;Obesity       PT Treatment Interventions DME instruction;Modalities;Gait training;Neuromuscular re-education;Balance training;Stair training;Functional mobility training;Patient/family education;Therapeutic activities;Wheelchair mobility training;Therapeutic exercise;Manual techniques    PT Goals (Current goals can be found in the Care Plan section)  Acute Rehab PT Goals Patient Stated Goal: return to PLOF PT Goal Formulation: With patient Time For Goal Achievement: 10/17/24 Potential to Achieve Goals: Fair    Frequency Min 2X/week     Co-evaluation               AM-PAC PT 6 Clicks Mobility  Outcome Measure Help needed turning from your back to your side while in a flat bed without using bedrails?: A Lot Help needed moving from lying on your  back to sitting on the side of a flat bed without using bedrails?: Total Help needed moving to and from a bed to a chair (including a wheelchair)?: Total Help needed standing up from a chair using your arms (e.g., wheelchair or bedside chair)?: Total Help needed to walk in hospital room?: Total Help needed climbing 3-5 steps with a railing? : Total 6 Click Score: 7    End of Session   Activity Tolerance: Patient limited by pain (2/2 purewick irritating scrotum during bed mobility) Patient left: in bed;with call bell/phone within reach;with bed alarm set (all 4 rails up per pt request)   PT Visit Diagnosis: History of falling (Z91.81);Muscle weakness (generalized) (M62.81);Other abnormalities of gait and mobility (R26.89);Difficulty in walking, not elsewhere classified (R26.2);Unsteadiness on feet (R26.81)    Time: 8789-8754 PT Time Calculation (  min) (ACUTE ONLY): 35 min   Charges:   PT Evaluation $PT Eval Moderate Complexity: 1 Mod   PT General Charges $$ ACUTE PT VISIT: 1 Visit         Richerd Pinal, PT, DPT 10/03/24, 12:56 PM  Richerd CHRISTELLA Pinal 10/03/2024, 12:54 PM

## 2024-10-03 NOTE — Progress Notes (Incomplete Revision)
     [facility] Progress Note  CC:  Heme positive, decreased hemoglobin/anemia   Subjective:  Feels pretty good.  Having brown normal appearing stools.    Objective:  Vital signs in last 24 hours: Temp:  [97.9 F (36.6 C)-99.5 F (37.5 C)] 98.3 F (36.8 C) (10/22 1022) Pulse Rate:  [67-84] 67 (10/22 1022) Resp:  [20] 20 (10/22 1022) BP: (114-145)/(52-70) 145/53 (10/22 1022) SpO2:  [93 %-97 %] 97 % (10/22 1022) Last BM Date : 10/01/24 General:  Alert, Well-developed, in NAD Heart:  Regular rate and rhythm; no murmurs Pulm:  CTAB.  No W/R/R. Abdomen:  Soft, obese, non-distended.  BS present.  Non-tender. Extremities:  Left BKA.  Bruising on his left thigh. Neurologic:  Alert and oriented x 4;  grossly normal neurologically.  Intake/Output from previous day: 10/21 0701 - 10/22 0700 In: 480 [P.O.:480] Out: 2500 [Urine:2500]  Lab Results: Recent Labs    10/01/24 0328 10/02/24 0424 10/03/24 0430  WBC 7.4 8.3 10.5  HGB 8.7* 7.8* 7.8*  HCT 28.9* 25.9* 25.4*  PLT 148* 142* 135*   BMET Recent Labs    10/01/24 0328 10/02/24 0424 10/03/24 0430  NA 131* 131* 131*  K 3.5 3.5 3.5  CL 92* 93* 93*  CO2 25 26 26   GLUCOSE 169* 186* 273*  BUN 25* 24* 20  CREATININE 1.62* 1.44* 1.38*  CALCIUM  8.4* 8.2* 8.1*   Assessment / Plan: Heme + stool and decreased Hgb.  Hgb 7.8 grams today, stable from yesterday.  Received 2 units PRBCs this hospitalization.  First capsule incomplete due to stool, etc.  Repeat VCE performed yesterday.   Chronic macrocytic anemia - was B12 deficient earlier this year - also iron deficient and has had Feraheme  and is on B12 and oral iron   EGD May 2025 for same issue though there was question of melena then negative for cause of anemia including duodenal biopsies   History of T1 N0 M0 cecal cancer resected 2017 status post negative colonoscopy September 2024 (only had hemorrhoids and diverticulosis).   Eliquis  therapy for atrial  fibrillation last dose 10/17 PM   -Await results of VCE. -Monitor Hgb and transfuse further if needed.   LOS: 4 days   [name]. Zehr  10/03/2024, 10:56 AM

## 2024-10-03 NOTE — Progress Notes (Signed)
 PROGRESS NOTE    [name]  FMW:[id number] DOB: 1951-08-22 DOA: 09/29/2024 PCP: Arloa Jarvis, NP   Brief Narrative:  [name] is a 73 y.o. male who presents after a mechanical fall/weakness -reports his walking aid got stuck which caused his fall.  He does note worsening weakness and intake is related be profoundly anemic from baseline.  He has a known medical history including 4.4 cm ascending aortic aneurysm, atrial fibrillation on Eliquis , CKD baseline Cr =1.3,BPH, hypertension, type 2 diabetes mellitus, left-sided AKA, chronic lymphedema, history of cecal cancer status post colectomy in 2017.  Assessment & Plan:   Principal Problem:   Acute blood loss anemia Active Problems:   DM2 (diabetes mellitus, type 2) (HCC)   Current use of long term anticoagulation   Essential hypertension, benign   CKD (chronic kidney disease) stage 3, GFR 30-59 ml/min (HCC)   GI bleed   AVM (arteriovenous malformation) of small bowel, acquired   Abnormal finding on GI tract imaging   Heme positive stool   Symptomatic anemia   Iron deficiency   Acute symptomatic blood loss anemia, rule out GI bleed On chronic anemia(mixed b12 and iron deficiency), POA  - Eliquis  on hold -last dose 10/17 - 2 unit PRBC transfused, hemoglobin 6.9 and intake, repeat today 7.8  -Hgb baseline labile, 8-10 over the past year - GI following, appreciate insight recommendations -initial capsule endoscopy 10/01/2024 noted 2 AVMs out of reach of the small bowel enteroscope, repeat capsule endoscopy 10/02/2024 pending - Advance diet per GI   Atypical erythema/cellulitis left lower extremity  - Patient reports this is worsening after rolling in bed/questionable traumatic - Continue to follow clinically, to be outlined by nurse to ensure no rapid spread - Patient has distant history of shingles but this is not a vesicular rash, unlikely  A-fib on Eliquis , rate controlled - Eliquis  will be held given above CKD2  - Volume status posttransfusion, likely benefit from diuretics Lower extremity wounds /lymphedema, chronic - Wound care following, appreciate insight recommendations - attempt to secure compression device for L stump from home given worsening edema while supine DMT2 -continue sliding scale insulin , hypoglycemic protocol  DVT prophylaxis: Early ambulation (SCDs of limited value given amputation) avoid chemical prophylaxis due to above Code Status:   Code Status: Full Code Family Communication: None present/available by phone  Status is: Patient  Dispo: The patient is from: Home              Anticipated d/c is to: Home              Anticipated d/c date is: 48-72 hours              Patient currently not medically stable for discharge  Consultants:  GI, wound care  Procedures:  Pill endoscopy 09/30/2024  Antimicrobials:  None  Subjective: No acute issues or events overnight nausea vomiting diarrhea constipation headache fevers chills or chest pain, left stump pain ongoing but improving  Objective: Vitals:   10/02/24 0740 10/02/24 1308 10/02/24 2112 10/03/24 0525  BP:  (!) 118/52 130/70 114/61  Pulse:  84 82 74  Resp:  20    Temp:  99.5 F (37.5 C) 97.9 F (36.6 C) 98.9 F (37.2 C)  TempSrc:  Oral Oral Oral  SpO2:  95% 96% 93%  Weight: 103 kg     Height: 6' 3 (1.905 m)       Intake/Output Summary (Last 24 hours) at 10/03/2024 9247 Last data filed at 10/03/2024  9579 Gross per 24 hour  Intake 480 ml  Output 2500 ml  Net -2020 ml   Filed Weights   09/29/24 1824 09/30/24 1451 10/02/24 0740  Weight: (!) 152.4 kg 103 kg 103 kg    Examination:  General:  Pleasantly resting in bed, No acute distress. HEENT:  Normocephalic atraumatic.  Sclerae nonicteric, noninjected.  Extraocular movements intact bilaterally. Neck:  Without mass or deformity.  Trachea is midline. Lungs:  Clear to auscultate bilaterally without rhonchi, wheeze, or rales. Heart:  Regular rate and  rhythm.  Without murmurs, rubs, or gallops. Abdomen:  Soft, nontender, nondistended.  Without guarding or rebound. Extremities: Without cyanosis, clubbing, edema. L BKA with notable blanching erythema laterally in geographical pattern Skin:  Warm and dry, noted geographic appearing erythema of left lower extremity/stump  Data Reviewed: I have personally reviewed following labs and imaging studies  CBC: Recent Labs  Lab 09/29/24 1324 09/30/24 0420 09/30/24 0857 10/01/24 0328 10/02/24 0424 10/03/24 0430  WBC 14.6* 11.3*  --  7.4 8.3 10.5  NEUTROABS 13.9*  --   --   --   --   --   HGB 6.5* 7.0* 9.0* 8.7* 7.8* 7.8*  HCT 21.8* 23.0* 29.8* 28.9* 25.9* 25.4*  MCV 106.9* 103.1*  --  104.3* 105.3* 102.8*  PLT 178 156  --  148* 142* 135*   Basic Metabolic Panel: Recent Labs  Lab 09/29/24 1324 09/30/24 0420 10/01/24 0328 10/02/24 0424 10/03/24 0430  NA 129* 131* 131* 131* 131*  K 4.3 3.8 3.5 3.5 3.5  CL 91* 93* 92* 93* 93*  CO2 25 26 25 26 26   GLUCOSE 205* 159* 169* 186* 273*  BUN 22 23 25* 24* 20  CREATININE 1.46* 1.50* 1.62* 1.44* 1.38*  CALCIUM  8.3* 8.3* 8.4* 8.2* 8.1*   GFR: Estimated Creatinine Clearance: 62 mL/min (A) (by C-G formula based on SCr of 1.38 mg/dL (H)).  Cardiac Enzymes: Recent Labs  Lab 09/29/24 1324  CKTOTAL 50   CBG: Recent Labs  Lab 10/02/24 0732 10/02/24 1144 10/02/24 1617 10/02/24 2010 10/03/24 0739  GLUCAP 177* 210* 250* 288* 224*    No results found for this or any previous visit (from the past 240 hours).   Radiology Studies: No results found.  Scheduled Meds:  bisacodyl   10 mg Oral Once   furosemide   40 mg Intravenous Daily   insulin  aspart  0-6 Units Subcutaneous TID WC   pantoprazole  (PROTONIX ) IV  40 mg Intravenous Q12H   traZODone   100 mg Oral QHS   Continuous Infusions:   LOS: 4 days   Time spent: 55 min  [name], DO [facility]  If 7PM-7AM, please contact  night-coverage [email or web address]  10/03/2024, 7:52 AM

## 2024-10-04 ENCOUNTER — Inpatient Hospital Stay (HOSPITAL_COMMUNITY): Admitting: Certified Registered Nurse Anesthetist

## 2024-10-04 ENCOUNTER — Encounter (HOSPITAL_COMMUNITY): Payer: Self-pay | Admitting: Internal Medicine

## 2024-10-04 ENCOUNTER — Encounter (HOSPITAL_COMMUNITY)
Admission: EM | Disposition: A | Discharge status: Home / Self Care | Payer: Self-pay | Source: Home / Self Care | Attending: Internal Medicine

## 2024-10-04 DIAGNOSIS — K2970 Gastritis, unspecified, without bleeding: Secondary | ICD-10-CM

## 2024-10-04 DIAGNOSIS — I5032 Chronic diastolic (congestive) heart failure: Secondary | ICD-10-CM | POA: Diagnosis not present

## 2024-10-04 DIAGNOSIS — I130 Hypertensive heart and chronic kidney disease with heart failure and stage 1 through stage 4 chronic kidney disease, or unspecified chronic kidney disease: Secondary | ICD-10-CM | POA: Diagnosis not present

## 2024-10-04 DIAGNOSIS — K2960 Other gastritis without bleeding: Secondary | ICD-10-CM | POA: Diagnosis not present

## 2024-10-04 DIAGNOSIS — N183 Chronic kidney disease, stage 3 unspecified: Secondary | ICD-10-CM | POA: Diagnosis not present

## 2024-10-04 DIAGNOSIS — K2289 Other specified disease of esophagus: Secondary | ICD-10-CM

## 2024-10-04 DIAGNOSIS — K5520 Angiodysplasia of colon without hemorrhage: Secondary | ICD-10-CM | POA: Diagnosis not present

## 2024-10-04 DIAGNOSIS — D649 Anemia, unspecified: Secondary | ICD-10-CM | POA: Diagnosis not present

## 2024-10-04 DIAGNOSIS — D509 Iron deficiency anemia, unspecified: Secondary | ICD-10-CM | POA: Diagnosis not present

## 2024-10-04 HISTORY — PX: ENTEROSCOPY: SHX5533

## 2024-10-04 LAB — CBC
HCT: 25.5 % — ABNORMAL LOW (ref 39.0–52.0)
Hemoglobin: 7.8 g/dL — ABNORMAL LOW (ref 13.0–17.0)
MCH: 31.6 pg (ref 26.0–34.0)
MCHC: 30.6 g/dL (ref 30.0–36.0)
MCV: 103.2 fL — ABNORMAL HIGH (ref 80.0–100.0)
Platelets: 146 K/uL — ABNORMAL LOW (ref 150–400)
RBC: 2.47 MIL/uL — ABNORMAL LOW (ref 4.22–5.81)
RDW: 17.3 % — ABNORMAL HIGH (ref 11.5–15.5)
WBC: 9.5 K/uL (ref 4.0–10.5)
nRBC: 0 % (ref 0.0–0.2)

## 2024-10-04 LAB — BASIC METABOLIC PANEL WITH GFR
Anion gap: 11 (ref 5–15)
BUN: 20 mg/dL (ref 8–23)
CO2: 28 mmol/L (ref 22–32)
Calcium: 8.4 mg/dL — ABNORMAL LOW (ref 8.9–10.3)
Chloride: 93 mmol/L — ABNORMAL LOW (ref 98–111)
Creatinine, Ser: 1.28 mg/dL — ABNORMAL HIGH (ref 0.61–1.24)
GFR, Estimated: 59 mL/min — ABNORMAL LOW (ref >60)
Glucose, Bld: 244 mg/dL — ABNORMAL HIGH (ref 70–99)
Potassium: 3.3 mmol/L — ABNORMAL LOW (ref 3.5–5.1)
Sodium: 132 mmol/L — ABNORMAL LOW (ref 135–145)

## 2024-10-04 LAB — GLUCOSE, CAPILLARY
Glucose-Capillary: 218 mg/dL — ABNORMAL HIGH (ref 70–99)
Glucose-Capillary: 148 mg/dL — ABNORMAL HIGH (ref 70–99)
Glucose-Capillary: 140 mg/dL — ABNORMAL HIGH (ref 70–99)
Glucose-Capillary: 152 mg/dL — ABNORMAL HIGH (ref 70–99)
Glucose-Capillary: 262 mg/dL — ABNORMAL HIGH (ref 70–99)

## 2024-10-04 SURGERY — ENTEROSCOPY
Anesthesia: Monitor Anesthesia Care

## 2024-10-04 MED ORDER — PROPOFOL 1000 MG/100ML IV EMUL
INTRAVENOUS | Status: AC
  Filled 2024-10-04: qty 100

## 2024-10-04 MED ORDER — PROPOFOL 10 MG/ML IV BOLUS
INTRAVENOUS | Status: DC | PRN
  Administered 2024-10-04: 125 ug/kg/min via INTRAVENOUS

## 2024-10-04 MED ORDER — SPOT INK MARKER SYRINGE KIT
PACK | SUBMUCOSAL | Status: AC
  Filled 2024-10-04: qty 5

## 2024-10-04 MED ORDER — SUCRALFATE 1 G PO TABS
1.0000 g | ORAL_TABLET | Freq: Two times a day (BID) | ORAL | Status: DC
Start: 2024-10-04 — End: 2024-10-18
  Administered 2024-10-04 – 2024-10-05 (×2): 1 g via ORAL
  Filled 2024-10-04 (×3): qty 1

## 2024-10-04 MED ORDER — PANTOPRAZOLE SODIUM 40 MG PO TBEC
40.0000 mg | DELAYED_RELEASE_TABLET | Freq: Two times a day (BID) | ORAL | Status: DC
  Administered 2024-10-04 – 2024-10-05 (×2): 40 mg via ORAL
  Filled 2024-10-04 (×3): qty 1

## 2024-10-04 MED ORDER — SPOT INK MARKER SYRINGE KIT
PACK | SUBMUCOSAL | Status: DC | PRN
  Administered 2024-10-04: 2 mL via SUBMUCOSAL

## 2024-10-04 MED ORDER — AMISULPRIDE (ANTIEMETIC) 5 MG/2ML IV SOLN: 10.0000 mg | Freq: Once | INTRAVENOUS | Status: DC | PRN

## 2024-10-04 MED ORDER — ONDANSETRON HCL 4 MG/2ML IJ SOLN: 4.0000 mg | Freq: Once | INTRAMUSCULAR | Status: DC | PRN

## 2024-10-04 NOTE — Transfer of Care (Signed)
 Immediate Anesthesia Transfer of Care Note  Patient: [name]  Procedure(s) Performed: ENTEROSCOPY  Patient Location: Endoscopy Unit  Anesthesia Type:MAC  Level of Consciousness: drowsy  Airway & Oxygen Therapy: Patient Spontanous Breathing and Patient connected to face mask  Post-op Assessment: Report given to RN and Post -op Vital signs reviewed and stable  Post vital signs: Reviewed and stable  Last Vitals:  Vitals Value Taken Time  BP    Temp    Pulse    Resp    SpO2      Last Pain:  Vitals:   10/04/24 1438  TempSrc: Temporal  PainSc: 0-No pain      Patients Stated Pain Goal: 0 (10/04/24 1438)  Complications: No notable events documented.

## 2024-10-04 NOTE — Anesthesia Preprocedure Evaluation (Addendum)
 Anesthesia Evaluation  Patient identified by MRN, date of birth, ID band Patient awake    Reviewed: Allergy & Precautions, H&P , NPO status , Patient's Chart, lab work & pertinent test results  Airway Mallampati: II  TM Distance: >3 FB Neck ROM: Full    Dental no notable dental hx. (+) Teeth Intact   Pulmonary neg pulmonary ROS, former smoker   Pulmonary exam normal breath sounds clear to auscultation       Cardiovascular hypertension, +CHF  negative cardio ROS Normal cardiovascular exam+ dysrhythmias Atrial Fibrillation  Rhythm:Regular Rate:Normal     Neuro/Psych negative neurological ROS  negative psych ROS   GI/Hepatic negative GI ROS, Neg liver ROS, PUD,,,  Endo/Other  negative endocrine ROSdiabetes, Type 2, Oral Hypoglycemic Agents    Renal/GU Renal diseasenegative Renal ROS  negative genitourinary   Musculoskeletal negative musculoskeletal ROS (+) Arthritis ,    Abdominal   Peds negative pediatric ROS (+)  Hematology negative hematology ROS (+) Blood dyscrasia, anemia   Anesthesia Other Findings   Reproductive/Obstetrics negative OB ROS                              Anesthesia Physical Anesthesia Plan  ASA: 3  Anesthesia Plan: MAC   Post-op Pain Management: Minimal or no pain anticipated   Induction: Intravenous  PONV Risk Score and Plan: 0 and Propofol  infusion  Airway Management Planned: Natural Airway and Nasal Cannula  Additional Equipment: None  Intra-op Plan:   Post-operative Plan:   Informed Consent: I have reviewed the patients History and Physical, chart, labs and discussed the procedure including the risks, benefits and alternatives for the proposed anesthesia with the patient or authorized representative who has indicated his/her understanding and acceptance.       Plan Discussed with: CRNA and Anesthesiologist  Anesthesia Plan Comments:           Anesthesia Quick Evaluation

## 2024-10-04 NOTE — Anesthesia Procedure Notes (Signed)
 Procedure Name: MAC Date/Time: 10/04/2024 3:25 PM  Performed by: Judythe Tanda Aran, CRNAPre-anesthesia Checklist: Patient identified, Emergency Drugs available, Suction available and Patient being monitored Patient Re-evaluated:Patient Re-evaluated prior to induction Oxygen Delivery Method: Simple face mask

## 2024-10-04 NOTE — Op Note (Signed)
 [facility] Patient Name: [name] Procedure Date: 10/04/2024 MRN: [id number] Attending MD: Aloha Finner , MD, [id number] Date of Birth: October 15, 1951 CSN: [id number] Age: 73 Admit Type: Inpatient Procedure:                Small bowel enteroscopy Indications:              Iron deficiency anemia, Angioectasia, For therapy                            of angioectasia Providers:                Aloha Finner, MD, Hoy Penner, RN, Lorrayne Kitty, Technician Referring MD:              Medicines:                Monitored Anesthesia Care Complications:            No immediate complications. Estimated Blood Loss:     Estimated blood loss was minimal. Procedure:                Pre-Anesthesia Assessment:                           - Prior to the procedure, a History and Physical                            was performed, and patient medications and                            allergies were reviewed. The patient's tolerance of                            previous anesthesia was also reviewed. The risks                            and benefits of the procedure and the sedation                            options and risks were discussed with the patient.                            All questions were answered, and informed consent                            was obtained. Prior Anticoagulants: The patient has                            taken Eliquis  (apixaban ), last dose was 4 days                            prior to procedure. ASA Grade Assessment: III - A  patient with severe systemic disease. After                            reviewing the risks and benefits, the patient was                            deemed in satisfactory condition to undergo the                            procedure.                           After obtaining informed consent, the endoscope was                            passed under direct vision.  Throughout the                            procedure, the patient's blood pressure, pulse, and                            oxygen saturations were monitored continuously. The                            [id number]-[id number] ([id number] Olympus colonoscope was                            introduced through the mouth and advanced to the                            distal jejunum. The small bowel enteroscopy was                            accomplished without difficulty. The patient                            tolerated the procedure. Scope In: Scope Out: Findings:      No gross lesions were noted in the entire esophagus.      The Z-line was irregular and was found 45 cm from the incisors.      Segmental moderate inflammation characterized by erosions, erythema,       friability and granularity was found in the entire examined stomach.       Previously biopsied and negative for HP within last few months so not       redone      Two angioectasias with no bleeding were found in the third portion of       the duodenum and in the fourth portion of the duodenum. Fulguration to       ablate the lesion to prevent bleeding by argon plasma was successful.      Peptic duodenitis noted in the proximal duodenum otherwise normal mucosa       was found in the entire duodenum otherwise.      A single angioectasia with no bleeding was found in the proximal       jejunum. Fulguration to ablate the lesion to prevent bleeding  by argon       plasma was successful.      Normal mucosa was found in the rest of the visualized proximal jejunum.       Area was tattooed with an injection of Spot (carbon black) to demarcate       distal extent of today's procedure. Impression:               - No gross lesions in the entire esophagus. Z-line                            irregular, 45 cm from the incisors.                           - Erosive/hemorrhagic gastritis.                           - Two non-bleeding angioectasias in the  duodenum.                            Treated with argon plasma coagulation (APC).                           - Peptic duodenitis in proximal duodenum otherwise                            normal mucosa was found in the entire examined                            duodenum.                           - A single non-bleeding angioectasia in the                            jejunum. Treated with argon plasma coagulation                            (APC).                           - Normal mucosa was found in the visualized                            proximal jejunum. Tattooed distal extent. Recommendation:           - The patient will be observed post-procedure,                            until all discharge criteria are met.                           - Return patient to hospital ward for ongoing care.                           - PPI BID.                           -  Carafate 1 g twice daily for 2-weeks.                           - Recommend PO Iron once daily to start in 1 week.                           - Recommend IV Iron as outpatient (appreciate                            Medicine service arranging Hematology evaluation as                            outpatient).                           - There still remains at least 1 other AVM in the                            distal small bowel outside of reach of Single                            Balloon Enteroscopy. Hopefully Iron PO/IV will keep                            his counts up.                           - May restart Eliquis  on 10/25 PM to decrease risk                            of bleeding.                           - Inpatient GI will signoff at this time. Call if                            questions arise.                           - The findings and recommendations were discussed                            with the patient.                           - The findings and recommendations were discussed                            with the  referring physician. Procedure Code(s):        --- Professional ---                           [phone], Small intestinal endoscopy, enteroscopy  beyond second portion of duodenum, not including                            ileum; with control of bleeding (eg, injection,                            bipolar cautery, unipolar cautery, laser, heater                            probe, stapler, plasma coagulator)                           44799, Unlisted procedure, small intestine Diagnosis Code(s):        --- Professional ---                           K22.89, Other specified disease of esophagus                           K29.70, Gastritis, unspecified, without bleeding                           K31.819, Angiodysplasia of stomach and duodenum                            without bleeding                           K55.20, Angiodysplasia of colon without hemorrhage                           D50.9, Iron deficiency anemia, unspecified CPT copyright 2022 American [employer] Association. All rights reserved. The codes documented in this report are preliminary and upon coder review may  be revised to meet current compliance requirements. Aloha Finner, MD 10/04/2024 4:12:59 PM Number of Addenda: 0

## 2024-10-04 NOTE — TOC Progression Note (Addendum)
 Transition of Care [facility]) - Progression Note    Patient Details  Name: [name] MRN: [id number] Date of Birth: 1951-01-21  [facility] CM/SW Contact  Tawni CHRISTELLA Eva, LCSW Phone Number: 10/04/2024, 12:46 PM  Clinical Narrative:     CSW spoke with pt regarding recommendations for SNF placement. Pt is agreeable and would like [facility], as he was placed there in the past. CSW explained the process and noted pt will need insurance authorization. Care Management to follow.   [name]  3:30pm  [facility] has accepted pt for SNF placement. CSW has initiated insurance auth , shara is pending. Care management to follow.     Barriers to Discharge: Continued Medical Work up               Expected Discharge Plan and Services       Living arrangements for the past 2 months: Assisted Living Facility                                       Social Drivers of Health (SDOH) Interventions SDOH Screenings   Food Insecurity: No Food Insecurity (09/29/2024)  Housing: Low Risk  (09/29/2024)  Transportation Needs: No Transportation Needs (09/29/2024)  Utilities: Not At Risk (09/29/2024)  Depression (PHQ2-9): Low Risk  (05/23/2024)  Social Connections: Moderately Isolated (09/29/2024)  Tobacco Use: Medium Risk (09/29/2024)    Readmission Risk Interventions    05/24/2024    3:23 PM 12/21/2023   11:59 AM 10/14/2023    2:54 PM  Readmission Risk Prevention Plan  Transportation Screening Complete Complete Complete  PCP or Specialist Appt within 5-7 Days  Complete Complete  PCP or Specialist Appt within 3-5 Days Complete    Home Care Screening  Complete Complete  Medication Review (RN CM)  Complete Complete  Social Work Consult for Recovery Care Planning/Counseling Complete    Palliative Care Screening Not Applicable    Medication Review Oceanographer) Complete

## 2024-10-04 NOTE — Anesthesia Postprocedure Evaluation (Signed)
 Anesthesia Post Note  Patient: TREYSEN SUDBECK  Procedure(s) Performed: ENTEROSCOPY     Patient location during evaluation: PACU Anesthesia Type: MAC Level of consciousness: awake and alert Pain management: pain level controlled Vital Signs Assessment: post-procedure vital signs reviewed and stable Respiratory status: spontaneous breathing, nonlabored ventilation, respiratory function stable and patient connected to nasal cannula oxygen Cardiovascular status: stable and blood pressure returned to baseline Postop Assessment: no apparent nausea or vomiting Anesthetic complications: no   No notable events documented.  Last Vitals:  Vitals:   10/04/24 1610 10/04/24 1619  BP: (!) 125/52 131/74  Pulse: 91 85  Resp: (!) 28 (!) 21  Temp:    SpO2: 92% 98%    Last Pain:  Vitals:   10/04/24 1619  TempSrc:   PainSc: 0-No pain                 Kailyn Dubie

## 2024-10-04 NOTE — [place] FL2 (Signed)
 [place]  MEDICAID FL2 LEVEL OF CARE FORM     IDENTIFICATION  Patient Name: [name] Birthdate: 1951/08/17 Sex: male Admission Date (Current Location): 09/29/2024  [facility] and [place] Number:  Producer, television/film/video and Address:  [facility],  [address], [place] [address]      Provider Number: [id number]  Attending Physician Name and Address:  Lue Elsie BROCKS, MD  Relative Name and Phone Number:       Current Level of Care: Hospital Recommended Level of Care: Skilled Nursing Facility Prior Approval Number:    Date Approved/Denied:   PASRR Number: [id number]  Discharge Plan: SNF    Current Diagnoses: Patient Active Problem List   Diagnosis Date Noted   Erosive gastritis 10/03/2024   AVM (arteriovenous malformation) of small bowel, acquired 10/02/2024   Abnormal finding on GI tract imaging 10/02/2024   Heme positive stool 10/02/2024   Symptomatic anemia 10/02/2024   Iron deficiency 10/02/2024   Heme + stool 04/22/2024   Anemia due to vitamin B12 deficiency 04/22/2024   Acute blood loss anemia 04/21/2024   GI bleed 04/21/2024   Aortic root dilation 02/17/2024   Lymphedema 10/12/2023   Cellulitis 10/11/2023   Osteomyelitis of left foot (HCC) 10/30/2022   Cellulitis of left foot 10/27/2022   IDA (iron deficiency anemia) 09/10/2022   Chronic diastolic CHF (congestive heart failure) (HCC) 09/10/2022   CKD (chronic kidney disease) stage 2, GFR 60-89 ml/min 09/09/2022   Current use of long term anticoagulation 09/09/2022   History of colon cancer 09/09/2022   Anemia of chronic disease 09/09/2022   Venous stasis ulcers- BLE 09/09/2022   Acute cholecystitis due to biliary calculus 09/09/2022   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   Subacute eczematous & allergic contact dermatitis  09/09/2022   History of COVID-19 09/09/2022   History of homelessness 09/09/2022   Diverticulosis of colon 09/09/2022    Cholelithiasis 09/09/2022   Hepatic steatosis 09/09/2022   Living in assisted living 09/09/2022   Pain in right foot 05/06/2022   Atrial fibrillation with slow ventricular response (HCC) 12/23/2021   Pain of joint of left ankle and foot 02/26/2021   BPH (benign prostatic hyperplasia) 11/30/2016   Abnormal CT scan, colon    AKI (acute kidney injury)    Generalized weakness    Abdominal pain    Moderate protein-calorie malnutrition 11/10/2016   Constipation, chronic 11/09/2016   CKD (chronic kidney disease) stage 3, GFR 30-59 ml/min (HCC) 11/09/2016   Hyperbilirubinemia 11/09/2016   Hyponatremia 11/08/2016   RBBB 09/13/2013   Snoring 09/13/2013   Edema 09/07/2013   DM2 (diabetes mellitus, type 2) (HCC) 03/16/2013   Arthritis 03/16/2013   Essential hypertension, benign 02/23/2013   Morbid obesity (HCC) 02/23/2013   Paroxysmal atrial fibrillation (HCC) 02/23/2013   HLD (hyperlipidemia) 02/23/2013    Orientation RESPIRATION BLADDER Height & Weight     Self, Time, Situation, Place  Normal Continent Weight: 227 lb (103 kg) Height:  6' 3 (190.5 cm)  BEHAVIORAL SYMPTOMS/MOOD NEUROLOGICAL BOWEL NUTRITION STATUS      Incontinent Diet (d/c summary)  AMBULATORY STATUS COMMUNICATION OF NEEDS Skin   Extensive Assist Verbally Other (Comment) (Vascular ulcr Right Leg)                       Personal Care Assistance Level of Assistance  Bathing, Feeding, Dressing Bathing Assistance: Limited assistance Feeding assistance: Independent Dressing Assistance: Limited assistance     Functional Limitations Info  Sight, Hearing, Speech Sight Info: Adequate Hearing Info: Adequate Speech Info: Adequate    SPECIAL CARE FACTORS FREQUENCY  OT (By licensed OT), PT (By licensed PT)     PT Frequency: 5 x a week OT Frequency: 5 x a week            Contractures Contractures Info: Not present    Additional Factors Info  Code Status, Allergies Code Status Info: full Allergies Info:  no known allergies           Current Medications (10/04/2024):  This is the current hospital active medication list Current Facility-Administered Medications  Medication Dose Route Frequency Provider Last Rate Last Admin   0.9 %  sodium chloride  infusion   Intravenous Continuous Zehr, Jessica D, PA-C 10 mL/hr at 10/03/24 2219 New Bag at 10/03/24 2219   acetaminophen  (TYLENOL ) tablet 650 mg  650 mg Oral Q6H PRN Lue Elsie BROCKS, MD   650 mg at 10/04/24 0820   atorvastatin  (LIPITOR) tablet 20 mg  20 mg Oral QPM Lue Elsie BROCKS, MD   20 mg at 10/03/24 1700   bisacodyl  (DULCOLAX) EC tablet 10 mg  10 mg Oral Once Mansouraty, Gabriel Jr., MD       bisacodyl  (DULCOLAX) EC tablet 5 mg  5 mg Oral Daily PRN Claiborne, Claudia, MD   5 mg at 10/04/24 0827   docusate sodium  (COLACE) capsule 200 mg  200 mg Oral BID PRN Claiborne, Claudia, MD       furosemide  (LASIX ) injection 40 mg  40 mg Intravenous BID Lue Elsie BROCKS, MD   40 mg at 10/04/24 9180   insulin  aspart (novoLOG ) injection 0-15 Units  0-15 Units Subcutaneous TID WC Lue Elsie BROCKS, MD   2 Units at 10/04/24 1211   ondansetron  (ZOFRAN ) tablet 4 mg  4 mg Oral Q6H PRN Arthea Child, MD   4 mg at 09/30/24 2106   Or   ondansetron  (ZOFRAN ) injection 4 mg  4 mg Intravenous Q6H PRN Arthea Child, MD       pantoprazole  (PROTONIX ) injection 40 mg  40 mg Intravenous Q12H Nivia Colon, PA-C   40 mg at 10/04/24 9048   tamsulosin  (FLOMAX ) capsule 0.4 mg  0.4 mg Oral QPM Lue Elsie BROCKS, MD   0.4 mg at 10/03/24 1700   traZODone  (DESYREL ) tablet 100 mg  100 mg Oral QHS Claiborne, Claudia, MD   100 mg at 10/03/24 2217     Discharge Medications: Please see discharge summary for a list of discharge medications.  Relevant Imaging Results:  Relevant Lab Results:   Additional Information [id number]  Tawni HERO Tarvares Lant, LCSW

## 2024-10-04 NOTE — Progress Notes (Signed)
 PROGRESS NOTE    [name]  FMW:[id number] DOB: [birth year] DOA: 09/29/2024 PCP: Arloa Jarvis, NP   Brief Narrative:  [name] is a 73 y.o. male who presents after a mechanical fall/weakness -reports his walking aid got stuck which caused his fall.  He does note worsening weakness and intake is related be profoundly anemic from baseline.  He has a known medical history including 4.4 cm ascending aortic aneurysm, atrial fibrillation on Eliquis , CKD baseline Cr =1.3,BPH, hypertension, type 2 diabetes mellitus, left-sided AKA, chronic lymphedema, history of cecal cancer status post colectomy in 2017.  Assessment & Plan:   Principal Problem:   Acute blood loss anemia Active Problems:   DM2 (diabetes mellitus, type 2) (HCC)   Current use of long term anticoagulation   Essential hypertension, benign   CKD (chronic kidney disease) stage 3, GFR 30-59 ml/min (HCC)   GI bleed   AVM (arteriovenous malformation) of small bowel, acquired   Abnormal finding on GI tract imaging   Heme positive stool   Symptomatic anemia   Iron deficiency   Erosive gastritis  Acute symptomatic blood loss anemia, rule out GI bleed On chronic anemia(mixed b12 and iron deficiency), POA  - Eliquis  on hold -last dose 10/17 - 2 unit PRBC transfused, hemoglobin 6.9 and intake, repeat today 7.8  -Hgb baseline labile, 8-10 over the past year - GI following, appreciate insight recommendations -initial capsule endoscopy 10/01/2024 noted 2 AVMs out of reach of the small bowel enteroscope, repeat capsule endoscopy 10/02/2024 notable for multiple AVMs, plan for push enteroscopy today for evaluation and hopeful treatment of at least one of the more proximal AVM noted on capsule   Atypical erythema/cellulitis left lower extremity  - Patient reports this is worsening after rolling in bed/questionable traumatic - Continue to follow clinically, to be outlined by nurse to ensure no rapid spread - Patient has  distant history of shingles but this is not a vesicular rash, unlikely  A-fib on Eliquis , rate controlled - Eliquis  will be held given above CKD2 - Volume status posttransfusion, likely benefit from diuretics Lower extremity wounds /lymphedema, chronic - Wound care following, appreciate insight recommendations - attempt to secure compression device for L stump from home given worsening edema while supine DMT2 -continue sliding scale insulin , hypoglycemic protocol  DVT prophylaxis: Early ambulation (SCDs of limited value given amputation) avoid chemical prophylaxis due to above Code Status:   Code Status: Full Code Family Communication: None present/available by phone  Status is: Patient  Dispo: The patient is from: Home              Anticipated d/c is to: Home              Anticipated d/c date is: within 24h              Patient currently medically stable for discharge once cleared by GI after enteroscopy  Consultants:  GI, wound care  Procedures:  Capsule endoscopy 09/30/2024, repeat 10/02/2024 Enteroscopy 10/04/2024  Antimicrobials:  None  Subjective: No acute issues or events overnight nausea vomiting diarrhea constipation headache fevers chills or chest pain, left stump pain ongoing but improving  Objective: Vitals:   10/03/24 1022 10/03/24 1310 10/03/24 2047 10/04/24 0345  BP: (!) 145/53 (!) 140/53 118/63 (!) 126/54  Pulse: 67 76 71 81  Resp: 20 20 19 17   Temp: 98.3 F (36.8 C) 98.6 F (37 C) 99.3 F (37.4 C) 99 F (37.2 C)  TempSrc: Oral Oral Oral Oral  SpO2: 97% 96% 97% 95%  Weight:      Height:        Intake/Output Summary (Last 24 hours) at 10/04/2024 0800 Last data filed at 10/04/2024 0410 Gross per 24 hour  Intake 298.5 ml  Output 2950 ml  Net -2651.5 ml   Filed Weights   09/29/24 1824 09/30/24 1451 10/02/24 0740  Weight: (!) 152.4 kg 103 kg 103 kg    Examination:  General:  Pleasantly resting in bed, No acute distress. HEENT:  Normocephalic  atraumatic.  Sclerae nonicteric, noninjected.  Extraocular movements intact bilaterally. Neck:  Without mass or deformity.  Trachea is midline. Lungs:  Clear to auscultate bilaterally without rhonchi, wheeze, or rales. Heart:  Regular rate and rhythm.  Without murmurs, rubs, or gallops. Abdomen:  Soft, nontender, nondistended.  Without guarding or rebound. Extremities: Without cyanosis, clubbing, edema. L BKA with notable blanching erythema laterally in geographical pattern Skin:  Warm and dry, noted geographic appearing erythema of left lower extremity/stump  Data Reviewed: I have personally reviewed following labs and imaging studies  CBC: Recent Labs  Lab 09/29/24 1324 09/30/24 0420 09/30/24 0857 10/01/24 0328 10/02/24 0424 10/03/24 0430 10/04/24 0420  WBC 14.6* 11.3*  --  7.4 8.3 10.5 9.5  NEUTROABS 13.9*  --   --   --   --   --   --   HGB 6.5* 7.0* 9.0* 8.7* 7.8* 7.8* 7.8*  HCT 21.8* 23.0* 29.8* 28.9* 25.9* 25.4* 25.5*  MCV 106.9* 103.1*  --  104.3* 105.3* 102.8* 103.2*  PLT 178 156  --  148* 142* 135* 146*   Basic Metabolic Panel: Recent Labs  Lab 09/30/24 0420 10/01/24 0328 10/02/24 0424 10/03/24 0430 10/04/24 0420  NA 131* 131* 131* 131* 132*  K 3.8 3.5 3.5 3.5 3.3*  CL 93* 92* 93* 93* 93*  CO2 26 25 26 26 28   GLUCOSE 159* 169* 186* 273* 244*  BUN 23 25* 24* 20 20  CREATININE 1.50* 1.62* 1.44* 1.38* 1.28*  CALCIUM  8.3* 8.4* 8.2* 8.1* 8.4*   GFR: Estimated Creatinine Clearance: 66.8 mL/min (A) (by C-G formula based on SCr of 1.28 mg/dL (H)).  Cardiac Enzymes: Recent Labs  Lab 09/29/24 1324  CKTOTAL 50   CBG: Recent Labs  Lab 10/03/24 0739 10/03/24 1146 10/03/24 1651 10/03/24 2043 10/04/24 0747  GLUCAP 224* 251* 194* 246* 218*    No results found for this or any previous visit (from the past 240 hours).   Radiology Studies: No results found.  Scheduled Meds:  atorvastatin   20 mg Oral QPM   bisacodyl   10 mg Oral Once   furosemide   40 mg  Intravenous BID   insulin  aspart  0-15 Units Subcutaneous TID WC   pantoprazole  (PROTONIX ) IV  40 mg Intravenous Q12H   tamsulosin   0.4 mg Oral QPM   traZODone   100 mg Oral QHS   Continuous Infusions:  sodium chloride  10 mL/hr at 10/03/24 2219    LOS: 5 days   Time spent: 55 min  [name], DO [facility]  If 7PM-7AM, please contact night-coverage [email or web address]  10/04/2024, 8:00 AM

## 2024-10-04 NOTE — Interval H&P Note (Signed)
 History and Physical Interval Note:  10/04/2024 3:09 PM  [name]  has presented today for surgery, with the diagnosis of Anemia, AVM in small bowel.  The various methods of treatment have been discussed with the patient and family. After consideration of risks, benefits and other options for treatment, the patient has consented to  Procedure(s): ENTEROSCOPY (N/A) as a surgical intervention.  The patient's history has been reviewed, patient examined, no change in status, stable for surgery.  I have reviewed the patient's chart and labs.  Questions were answered to the patient's satisfaction.     Jiraiya Mcewan Mansouraty Jr

## 2024-10-04 NOTE — Plan of Care (Signed)
  Problem: Education: Goal: Knowledge of General Education information will improve Description: Including pain rating scale, medication(s)/side effects and non-pharmacologic comfort measures Outcome: Progressing   Problem: Clinical Measurements: Goal: Ability to maintain clinical measurements within normal limits will improve Outcome: Progressing   Problem: Coping: Goal: Level of anxiety will decrease Outcome: Progressing   

## 2024-10-05 DIAGNOSIS — D62 Acute posthemorrhagic anemia: Secondary | ICD-10-CM | POA: Diagnosis not present

## 2024-10-05 LAB — BASIC METABOLIC PANEL WITH GFR
Anion gap: 10 (ref 5–15)
BUN: 19 mg/dL (ref 8–23)
CO2: 31 mmol/L (ref 22–32)
Calcium: 8.4 mg/dL — ABNORMAL LOW (ref 8.9–10.3)
Chloride: 94 mmol/L — ABNORMAL LOW (ref 98–111)
Creatinine, Ser: 1.27 mg/dL — ABNORMAL HIGH (ref 0.61–1.24)
GFR, Estimated: 60 mL/min — ABNORMAL LOW (ref >60)
Glucose, Bld: 217 mg/dL — ABNORMAL HIGH (ref 70–99)
Potassium: 3.5 mmol/L (ref 3.5–5.1)
Sodium: 134 mmol/L — ABNORMAL LOW (ref 135–145)

## 2024-10-05 LAB — CBC
HCT: 24.8 % — ABNORMAL LOW (ref 39.0–52.0)
Hemoglobin: 7.4 g/dL — ABNORMAL LOW (ref 13.0–17.0)
MCH: 31 pg (ref 26.0–34.0)
MCHC: 29.8 g/dL — ABNORMAL LOW (ref 30.0–36.0)
MCV: 103.8 fL — ABNORMAL HIGH (ref 80.0–100.0)
Platelets: 170 K/uL (ref 150–400)
RBC: 2.39 MIL/uL — ABNORMAL LOW (ref 4.22–5.81)
RDW: 17.3 % — ABNORMAL HIGH (ref 11.5–15.5)
WBC: 6.7 K/uL (ref 4.0–10.5)
nRBC: 0 % (ref 0.0–0.2)

## 2024-10-05 LAB — GLUCOSE, CAPILLARY
Glucose-Capillary: 236 mg/dL — ABNORMAL HIGH (ref 70–99)
Glucose-Capillary: 215 mg/dL — ABNORMAL HIGH (ref 70–99)

## 2024-10-05 MED ORDER — PANTOPRAZOLE SODIUM 40 MG PO TBEC: 40.0000 mg | DELAYED_RELEASE_TABLET | Freq: Two times a day (BID) | ORAL | 0 refills | Status: AC

## 2024-10-05 MED ORDER — SUCRALFATE 1 G PO TABS: 1.0000 g | ORAL_TABLET | Freq: Two times a day (BID) | ORAL | 0 refills | Status: AC

## 2024-10-05 MED ORDER — APIXABAN 5 MG PO TABS
5.0000 mg | ORAL_TABLET | Freq: Two times a day (BID) | ORAL | 0 refills | Status: AC
Start: 2024-10-06 — End: ?

## 2024-10-05 NOTE — TOC Transition Note (Signed)
 Transition of Care [facility]) - Discharge Note   Patient Details  Name: [name] MRN: [id number] Date of Birth: 1951/10/24  [facility]) CM/SW Contact:  Tawni CHRISTELLA Eva, LCSW Phone Number: 10/05/2024, 9:26 AM   Clinical Narrative:     Pt's insurance shara was approved for short term rehab at [facility]. Pt room number 144 , RN to call report to [phone]. PTAR called, Care management sign off.   Final next level of care: Skilled Nursing Facility Barriers to Discharge: Barriers Resolved   Patient Goals and CMS Choice Patient states their goals for this hospitalization and ongoing recovery are:: snf to get stronger          Discharge Placement                    Patient and family notified of of transfer: 10/05/24  Discharge Plan and Services Additional resources added to the After Visit Summary for                                       Social Drivers of Health (SDOH) Interventions SDOH Screenings   Food Insecurity: No Food Insecurity (09/29/2024)  Housing: Low Risk  (09/29/2024)  Transportation Needs: No Transportation Needs (09/29/2024)  Utilities: Not At Risk (09/29/2024)  Depression (PHQ2-9): Low Risk  (05/23/2024)  Social Connections: Moderately Isolated (09/29/2024)  Tobacco Use: Medium Risk (10/04/2024)     Readmission Risk Interventions    05/24/2024    3:23 PM 12/21/2023   11:59 AM 10/14/2023    2:54 PM  Readmission Risk Prevention Plan  Transportation Screening Complete Complete Complete  PCP or Specialist Appt within 5-7 Days  Complete Complete  PCP or Specialist Appt within 3-5 Days Complete    Home Care Screening  Complete Complete  Medication Review (RN CM)  Complete Complete  Social Work Consult for Recovery Care Planning/Counseling Complete    Palliative Care Screening Not Applicable    Medication Review Oceanographer) Complete

## 2024-10-05 NOTE — Progress Notes (Signed)
 Report called to RN at [facility], awaiting PTAR.

## 2024-10-05 NOTE — Discharge Summary (Signed)
 Physician Discharge Summary  [name] FMW:[id number] DOB: Oct 28, 1951 DOA: 09/29/2024  PCP: Arloa Jarvis, NP  Admit date: 09/29/2024 Discharge date: 10/05/2024  Admitted From: Home Disposition: Facility  Recommendations for Outpatient Follow-up:  Follow up with PCP in 1-2 weeks For repeat labs in 1 to 2 weeks scheduled  Discharge Condition: Stable CODE STATUS: Full Diet recommendation: Low salt low-fat low-carb diet  Brief/Interim Summary: [name] is a 73 y.o. male who presents after a mechanical fall/weakness -reports his walking aid got stuck which caused his fall.  He does note worsening weakness and intake is related be profoundly anemic from baseline.  He has a known medical history including 4.4 cm ascending aortic aneurysm, atrial fibrillation on Eliquis , CKD baseline Cr =1.3,BPH, hypertension, type 2 diabetes mellitus, left-sided AKA, chronic lymphedema, history of cecal cancer status post colectomy in 2017.  Patient admitted after mechanical fall with notable anemia, on Eliquis  due to A-fib with concern over acute GI bleed.  After multiple capsule endoscopies and enteroscopy patient did have notable erosive hemorrhagic gastritis with nonbleeding angiectasia's of the duodenum and jejunum.  Treated with argon plasma coagulation by GI and otherwise stable and agreeable for discharge.  Given patient's worsening ambulatory dysfunction from baseline PT evaluated and recommending disposition to skilled nursing facility.  Plan to complete physical therapy and return home thereafter, close follow-up with PCP as scheduled.  Medication changes as below.  Okay to resume Eliquis  on 10/06/2024   Discharge Diagnoses:  Principal Problem:   Acute blood loss anemia Active Problems:   DM2 (diabetes mellitus, type 2) (HCC)   Current use of long term anticoagulation   Essential hypertension, benign   CKD (chronic kidney disease) stage 3, GFR 30-59 ml/min (HCC)   GI bleed   AVM  (arteriovenous malformation) of small bowel, acquired   Abnormal finding on GI tract imaging   Heme positive stool   Symptomatic anemia   Iron deficiency   Erosive gastritis  Acute symptomatic blood loss anemia, rule out GI bleed On chronic anemia(mixed b12 and iron deficiency), POA  - Eliquis  to resume 10/06/2024 - 2 unit PRBC transfused, hemoglobin 6.9 and intake, remained stable above 7 -Hgb baseline labile, 8-10 over the past year - GI following, appreciate insight recommendations -initial capsule endoscopy 10/01/2024 noted 2 AVMs out of reach of the small bowel enteroscope, repeat capsule endoscopy 10/02/2024 notable for multiple AVMs, enteroscopy 10/23 with multiple angiectasia's as above treated.  No further signs or symptoms of bleeding    Atypical erythema/cellulitis left lower extremity  - Patient reports this is worsening after rolling in bed/questionable traumatic in etiology - Continues to improve, erythema resolving   A-fib on Eliquis , rate controlled - Eliquis  to resume as above CKD2 -at baseline Lower extremity wounds /lymphedema, chronic - Wound care following, appreciate insight recommendations - attempt to secure compression device for L stump from home given worsening edema while supine DMT2 -continue home regimen, hypoglycemic protocol  Discharge Instructions  Discharge Instructions     Call MD for:  difficulty breathing, headache or visual disturbances   Complete by: As directed    Call MD for:  extreme fatigue   Complete by: As directed    Call MD for:  hives   Complete by: As directed    Call MD for:  persistant dizziness or light-headedness   Complete by: As directed    Call MD for:  persistant nausea and vomiting   Complete by: As directed    Call MD for:  severe uncontrolled pain   Complete by: As directed    Call MD for:  temperature >100.4   Complete by: As directed    Diet - low sodium heart healthy   Complete by: As directed    Diet renal/carb  modified with fluid restriction   Complete by: As directed    Discharge wound care:   Complete by: As directed    Cleanse right leg wounds every other day with saline, cover wounds with xeroform gauze, 4 x 4, ABD, Kerlix, and ACE wrap compression.   Increase activity slowly   Complete by: As directed       Allergies as of 10/05/2024   No Known Allergies      Medication List     TAKE these medications    acetaminophen  650 MG suppository Commonly known as: TYLENOL  Place 1 suppository (650 mg total) rectally every 6 (six) hours as needed for mild pain (pain score 1-3) or fever (or Fever >/= 101).   apixaban  5 MG Tabs tablet Commonly known as: ELIQUIS  Take 1 tablet (5 mg total) by mouth 2 (two) times daily. Start taking on: October 06, 2024   atorvastatin  20 MG tablet Commonly known as: LIPITOR Take 20 mg by mouth every evening.   Combine Pads 5X9 Pads by Does not apply route See admin instructions. Use as directed for wound care to right leg   cyanocobalamin  1000 MCG tablet Commonly known as: VITAMIN B12 Take 1 tablet (1,000 mcg total) by mouth daily.   Dermal Wound Cleanser 0.13 % Liqd Generic drug: Benzethonium Chloride Apply topically See admin instructions. Use as directed with wound care to right leg   docusate sodium  100 MG capsule Commonly known as: COLACE Take 1 capsule (100 mg total) by mouth 2 (two) times daily. What changed:  when to take this reasons to take this   FeroSul 325 (65 FE) MG tablet Generic drug: ferrous sulfate  Take 325 mg by mouth daily with breakfast.   fluocinolone 0.01 % cream Apply 1 Application topically See admin instructions. Apply  to lower legs 2 times a day   furosemide  80 MG tablet Commonly known as: LASIX  Take 80 mg by mouth daily.   glipiZIDE  10 MG 24 hr tablet Commonly known as: GLUCOTROL  XL Take 10 mg by mouth daily with breakfast.   Gvoke HypoPen 1-Pack 1 MG/0.2ML Soaj Generic drug: Glucagon Inject 1 mg into  the skin as needed (for emergency of severe hypoglycemia).   hydrOXYzine  25 MG tablet Commonly known as: ATARAX  Take 1 tablet (25 mg total) by mouth every 8 (eight) hours as needed for itching. Use caution as this medication may make you drowsy What changed:  when to take this additional instructions   metFORMIN  500 MG 24 hr tablet Commonly known as: GLUCOPHAGE -XR Take 1,000 mg by mouth in the morning and at bedtime.   mupirocin  ointment 2 % Commonly known as: BACTROBAN  Apply topically 2 (two) times daily. What changed:  how much to take when to take this additional instructions   nystatin  powder Commonly known as: MYCOSTATIN /NYSTOP  Apply 1 Application topically 2 (two) times daily.   pantoprazole  40 MG tablet Commonly known as: PROTONIX  Take 1 tablet (40 mg total) by mouth 2 (two) times daily.   polyethylene glycol 17 g packet Commonly known as: MIRALAX  / GLYCOLAX  Take 17 g by mouth daily as needed (Constipation).   Potassium Chloride  ER 20 MEQ Tbcr Take 1 tablet by mouth daily.   senna 8.6 MG Tabs tablet Commonly  known as: SENOKOT Take 1 tablet by mouth every 12 (twelve) hours as needed for mild constipation.   sucralfate 1 g tablet Commonly known as: CARAFATE Take 1 tablet (1 g total) by mouth 2 (two) times daily.   tamsulosin  0.4 MG Caps capsule Commonly known as: FLOMAX  Take 1 capsule (0.4 mg total) by mouth daily. What changed: when to take this   traZODone  100 MG tablet Commonly known as: DESYREL  Take 100 mg by mouth at bedtime.   triamcinolone cream 0.1 % Commonly known as: KENALOG Apply 1 Application topically as needed (Skin irritation).               Discharge Care Instructions  (From admission, onward)           Start     Ordered   10/05/24 0000  Discharge wound care:       Comments: Cleanse right leg wounds every other day with saline, cover wounds with xeroform gauze, 4 x 4, ABD, Kerlix, and ACE wrap compression.   10/05/24 0914             Contact information for after-discharge care     Destination     [facility] .   Service: Skilled Nursing Contact information: [address] [place]  [address] [phone]                    No Known Allergies  Consultations: GI  Procedures/Studies: DG Hip Unilat W or Wo Pelvis 2-3 Views Right Result Date: 09/29/2024 CLINICAL DATA:  Fall with hip pain. EXAM: DG HIP (WITH OR WITHOUT PELVIS) 2-3V RIGHT COMPARISON:  None Available. FINDINGS: Bones are diffusely demineralized. No evidence for an acute fracture in the bony pelvis. AP and frog-leg lateral views of the right hip show no femoral neck fracture. IMPRESSION: Negative. Electronically Signed   By: Camellia Candle M.D.   On: 09/29/2024 13:21     Subjective: No acute issues or events overnight denies nausea vomiting diarrhea constipation headache fever chills or chest pain   Discharge Exam: Vitals:   10/04/24 2225 10/05/24 0616  BP: (!) 136/56 135/60  Pulse: 75 70  Resp: 19 19  Temp: 98.5 F (36.9 C) 99.2 F (37.3 C)  SpO2: 93% 98%   Vitals:   10/04/24 1610 10/04/24 1619 10/04/24 2225 10/05/24 0616  BP: (!) 125/52 131/74 (!) 136/56 135/60  Pulse: 91 85 75 70  Resp: (!) 28 (!) 21 19 19   Temp:   98.5 F (36.9 C) 99.2 F (37.3 C)  TempSrc:   Oral Oral  SpO2: 92% 98% 93% 98%  Weight:      Height:        General: Pt is alert, awake, not in acute distress Cardiovascular: RRR, S1/S2 +, no rubs, no gallops Respiratory: CTA bilaterally, no wheezing, no rhonchi Abdominal: Soft, NT, ND, bowel sounds + Extremities: no edema, no cyanosis left BKA, blanching erythema laterally    The results of significant diagnostics from this hospitalization (including imaging, microbiology, ancillary and laboratory) are listed below for reference.     Microbiology: No results found for this or any previous visit (from the past 240 hours).   Labs:  Basic Metabolic Panel: Recent  Labs  Lab 10/01/24 0328 10/02/24 0424 10/03/24 0430 10/04/24 0420 10/05/24 0407  NA 131* 131* 131* 132* 134*  K 3.5 3.5 3.5 3.3* 3.5  CL 92* 93* 93* 93* 94*  CO2 25 26 26 28 31   GLUCOSE 169* 186* 273* 244*  217*  BUN 25* 24* 20 20 19   CREATININE 1.62* 1.44* 1.38* 1.28* 1.27*  CALCIUM  8.4* 8.2* 8.1* 8.4* 8.4*   Liver Function Tests: No results for input(s): AST, ALT, ALKPHOS, BILITOT, PROT, ALBUMIN in the last 168 hours. No results for input(s): LIPASE, AMYLASE in the last 168 hours. No results for input(s): AMMONIA in the last 168 hours. CBC: Recent Labs  Lab 09/29/24 1324 09/30/24 0420 10/01/24 0328 10/02/24 0424 10/03/24 0430 10/04/24 0420 10/05/24 0407  WBC 14.6*   < > 7.4 8.3 10.5 9.5 6.7  NEUTROABS 13.9*  --   --   --   --   --   --   HGB 6.5*   < > 8.7* 7.8* 7.8* 7.8* 7.4*  HCT 21.8*   < > 28.9* 25.9* 25.4* 25.5* 24.8*  MCV 106.9*   < > 104.3* 105.3* 102.8* 103.2* 103.8*  PLT 178   < > 148* 142* 135* 146* 170   < > = values in this interval not displayed.   Cardiac Enzymes: Recent Labs  Lab 09/29/24 1324  CKTOTAL 50   BNP: Invalid input(s): POCBNP CBG: Recent Labs  Lab 10/04/24 1138 10/04/24 1612 10/04/24 1635 10/04/24 2222 10/05/24 0722  GLUCAP 148* 140* 152* 262* 236*   D-Dimer No results for input(s): DDIMER in the last 72 hours. Hgb A1c No results for input(s): HGBA1C in the last 72 hours. Lipid Profile No results for input(s): CHOL, HDL, LDLCALC, TRIG, CHOLHDL, LDLDIRECT in the last 72 hours. Thyroid  function studies No results for input(s): TSH, T4TOTAL, T3FREE, THYROIDAB in the last 72 hours.  Invalid input(s): FREET3 Anemia work up No results for input(s): VITAMINB12, FOLATE, FERRITIN, TIBC, IRON, RETICCTPCT in the last 72 hours. Urinalysis    Component Value Date/Time   COLORURINE YELLOW 09/29/2024 1340   APPEARANCEUR CLEAR 09/29/2024 1340   LABSPEC 1.018 09/29/2024 1340    PHURINE 7.0 09/29/2024 1340   GLUCOSEU 50 (A) 09/29/2024 1340   HGBUR NEGATIVE 09/29/2024 1340   BILIRUBINUR NEGATIVE 09/29/2024 1340   BILIRUBINUR NEG 05/27/2013 1516   KETONESUR NEGATIVE 09/29/2024 1340   PROTEINUR 30 (A) 09/29/2024 1340   UROBILINOGEN 0.2 05/27/2013 1516   NITRITE NEGATIVE 09/29/2024 1340   LEUKOCYTESUR NEGATIVE 09/29/2024 1340   Sepsis Labs Recent Labs  Lab 10/02/24 0424 10/03/24 0430 10/04/24 0420 10/05/24 0407  WBC 8.3 10.5 9.5 6.7   Microbiology No results found for this or any previous visit (from the past 240 hours).   Time coordinating discharge: Over 30 minutes  SIGNED:   [name], DO [facility] 10/05/2024, 11:36 AM Pager   If 7PM-7AM, please contact night-coverage [email or web address]

## 2024-10-08 ENCOUNTER — Encounter (HOSPITAL_COMMUNITY): Payer: Self-pay | Admitting: Gastroenterology

## 2024-10-18 ENCOUNTER — Encounter: Admitting: Dietician

## 2024-11-03 ENCOUNTER — Other Ambulatory Visit: Payer: Self-pay

## 2024-11-03 ENCOUNTER — Emergency Department (HOSPITAL_COMMUNITY)
Admission: EM | Admit: 2024-11-03 | Discharge: 2024-11-03 | Disposition: A | Discharge status: Home / Self Care | Source: Skilled Nursing Facility | Attending: Emergency Medicine | Admitting: Emergency Medicine

## 2024-11-03 ENCOUNTER — Encounter (HOSPITAL_COMMUNITY): Payer: Self-pay | Admitting: *Deleted

## 2024-11-03 DIAGNOSIS — N189 Chronic kidney disease, unspecified: Secondary | ICD-10-CM | POA: Diagnosis not present

## 2024-11-03 DIAGNOSIS — L89899 Pressure ulcer of other site, unspecified stage: Secondary | ICD-10-CM | POA: Insufficient documentation

## 2024-11-03 DIAGNOSIS — Z7984 Long term (current) use of oral hypoglycemic drugs: Secondary | ICD-10-CM | POA: Diagnosis not present

## 2024-11-03 DIAGNOSIS — Z85038 Personal history of other malignant neoplasm of large intestine: Secondary | ICD-10-CM | POA: Insufficient documentation

## 2024-11-03 DIAGNOSIS — E1165 Type 2 diabetes mellitus with hyperglycemia: Secondary | ICD-10-CM | POA: Diagnosis not present

## 2024-11-03 DIAGNOSIS — I129 Hypertensive chronic kidney disease with stage 1 through stage 4 chronic kidney disease, or unspecified chronic kidney disease: Secondary | ICD-10-CM | POA: Diagnosis not present

## 2024-11-03 DIAGNOSIS — E1122 Type 2 diabetes mellitus with diabetic chronic kidney disease: Secondary | ICD-10-CM | POA: Insufficient documentation

## 2024-11-03 DIAGNOSIS — R600 Localized edema: Secondary | ICD-10-CM

## 2024-11-03 DIAGNOSIS — Z7901 Long term (current) use of anticoagulants: Secondary | ICD-10-CM | POA: Insufficient documentation

## 2024-11-03 DIAGNOSIS — Z5900 Homelessness unspecified: Secondary | ICD-10-CM | POA: Diagnosis not present

## 2024-11-03 DIAGNOSIS — L8990 Pressure ulcer of unspecified site, unspecified stage: Secondary | ICD-10-CM

## 2024-11-03 DIAGNOSIS — I4891 Unspecified atrial fibrillation: Secondary | ICD-10-CM | POA: Insufficient documentation

## 2024-11-03 LAB — COMPREHENSIVE METABOLIC PANEL WITH GFR
ALT: 9 U/L (ref 0–44)
AST: 15 U/L (ref 15–41)
Albumin: 3.8 g/dL (ref 3.5–5.0)
Alkaline Phosphatase: 94 U/L (ref 38–126)
Anion gap: 9 (ref 5–15)
BUN: 21 mg/dL (ref 8–23)
CO2: 30 mmol/L (ref 22–32)
Calcium: 9.2 mg/dL (ref 8.9–10.3)
Chloride: 95 mmol/L — ABNORMAL LOW (ref 98–111)
Creatinine, Ser: 1.23 mg/dL (ref 0.61–1.24)
GFR, Estimated: >60 mL/min (ref >60)
Glucose, Bld: 272 mg/dL — ABNORMAL HIGH (ref 70–99)
Potassium: 3.8 mmol/L (ref 3.5–5.1)
Sodium: 134 mmol/L — ABNORMAL LOW (ref 135–145)
Total Bilirubin: 0.3 mg/dL (ref 0.0–1.2)
Total Protein: 8.1 g/dL (ref 6.5–8.1)

## 2024-11-03 LAB — CBC WITH DIFFERENTIAL/PLATELET
Abs Immature Granulocytes: 0.02 K/uL (ref 0.00–0.07)
Basophils Absolute: 0 K/uL (ref 0.0–0.1)
Basophils Relative: 0 %
Eosinophils Absolute: 0.2 K/uL (ref 0.0–0.5)
Eosinophils Relative: 3 %
HCT: 31.9 % — ABNORMAL LOW (ref 39.0–52.0)
Hemoglobin: 10 g/dL — ABNORMAL LOW (ref 13.0–17.0)
Immature Granulocytes: 0 %
Lymphocytes Relative: 10 %
Lymphs Abs: 0.8 K/uL (ref 0.7–4.0)
MCH: 30.5 pg (ref 26.0–34.0)
MCHC: 31.3 g/dL (ref 30.0–36.0)
MCV: 97.3 fL (ref 80.0–100.0)
Monocytes Absolute: 0.2 K/uL (ref 0.1–1.0)
Monocytes Relative: 2 %
Neutro Abs: 6.5 K/uL (ref 1.7–7.7)
Neutrophils Relative %: 85 %
Platelets: 191 K/uL (ref 150–400)
RBC: 3.28 MIL/uL — ABNORMAL LOW (ref 4.22–5.81)
RDW: 16.9 % — ABNORMAL HIGH (ref 11.5–15.5)
WBC: 7.7 K/uL (ref 4.0–10.5)
nRBC: 0 % (ref 0.0–0.2)

## 2024-11-03 NOTE — ED Notes (Signed)
 Abby PA to see pt, debrided old skin, spots bleeding, pt sensitive on foot, does not complain of pain.  Ortho tech consulted for [employer].

## 2024-11-03 NOTE — Consult Note (Addendum)
 WOC consulted for RLE wounds, wounds are debrided in the ED and Unna's boot has been placed by orthopedic tech.  Will not consult for that reason, updated orders for Unna's boot changes 2x wk.    Darlene Brozowski [facility], CNS, CWON-AP [phone]   Addendum: updated orders for TOC to obtain [facility] for RLE wound care and Unns's boots  Clease RLE with saline, pat dry. Apply silicone foam to the linear wound on the posterior calf with each Unna's boot change. Monitor this site closely, may need to wrap Unna's boots a bit higher to avoid pressure on this area. Change every other day with Unna's boots changes Raja Liska [facility], CNS, CWON-AP [phone]

## 2024-11-03 NOTE — ED Provider Notes (Signed)
 [place] EMERGENCY DEPARTMENT AT [facility] Provider Note   CSN: [id number] Arrival date & time: 11/03/24  9162     Patient presents with: No chief complaint on file.   [name] is a 73 y.o. 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  Patient reports that he was hospitalized up until about a month ago after her he fell for symptomatic anemia.  He was having wound management of his chronic right lower extremity lymphedema he was also in rehab for about a month getting care there and was discharged about 1 week ago.  Since that time he has had no wound management and has had the wrap for his leg in place for about 1 week.  He states that he continued to have significant edema in his right lower extremity, the bandage rolled down and began to compress the upper part of his leg and he started to have severe pain so he asked staff at the skilled nursing facility to loosen it for him but they cut the bandage because it was too tight.  Denies fever, weakness, shaking chills.   HPI     Prior to Admission medications   Medication Sig Start Date End Date Taking? Authorizing Provider  acetaminophen  (TYLENOL ) 650 MG suppository Place 1 suppository (650 mg total) rectally every 6 (six) hours as needed for mild pain (pain score 1-3) or fever (or Fever >/= 101). 05/28/24   Will Almarie MATSU, MD  apixaban  (ELIQUIS ) 5 MG TABS tablet Take 1 tablet (5 mg total) by mouth 2 (two) times daily. 10/06/24   Lue Elsie BROCKS, MD  atorvastatin  (LIPITOR) 20 MG tablet Take 20 mg by mouth every evening. 02/02/21   [provider]  Benzethonium Chloride (DERMAL WOUND CLEANSER) 0.13 % LIQD Apply topically See admin instructions. Use as directed with  wound care to right leg    [provider]  cyanocobalamin  (VITAMIN B12) 1000 MCG tablet Take 1 tablet (1,000 mcg total) by mouth daily. 04/22/24   Barbarann Nest, MD  docusate sodium  (COLACE) 100 MG capsule Take 1 capsule (100 mg total) by mouth 2 (two) times daily. Patient taking differently: Take 100 mg by mouth 2 (two) times daily as needed (Constipation). 12/08/16   Ricky Fines, MD  ferrous sulfate  (FEROSUL) 325 (65 FE) MG tablet Take 325 mg by mouth daily with breakfast.    [provider]  fluocinolone 0.01 % cream Apply 1 Application topically See admin instructions. Apply  to lower legs 2 times a day 05/04/23   [provider]  furosemide  (LASIX ) 80 MG tablet Take 80 mg by mouth daily. 09/25/24   [provider]  Gauze Pads & Dressings (COMBINE PADS) 5X9 PADS by Does not apply route See admin instructions. Use as directed for wound care to right leg    [provider]  glipiZIDE  (GLUCOTROL  XL) 10 MG 24 hr tablet Take 10 mg by mouth daily with breakfast.    [provider]  Glucagon (GVOKE HYPOPEN 1-PACK) 1 MG/0.2ML SOAJ Inject 1 mg into the skin as needed (for emergency of severe hypoglycemia).    [provider]  hydrOXYzine  (ATARAX /VISTARIL ) 25 MG tablet Take 1 tablet (25 mg total) by mouth every 8 (eight) hours as needed for itching. Use caution as this medication may make  you drowsy Patient taking differently: Take 25 mg by mouth every 6 (six) hours as needed for itching. 07/17/20   Little, Vernell Search, MD  metFORMIN  (GLUCOPHAGE -XR) 500 MG 24 hr tablet Take 1,000 mg by mouth in the morning and at bedtime.    [provider]  mupirocin  ointment (BACTROBAN ) 2 % Apply topically 2 (two) times daily. Patient taking differently: Apply 1 Application topically See admin instructions. Apply to affected areas 2 times a day as a part of wound care 04/22/24   Barbarann Nest, MD  nystatin  (MYCOSTATIN /NYSTOP ) powder Apply 1  Application topically 2 (two) times daily. 08/21/24   [provider]  pantoprazole  (PROTONIX ) 40 MG tablet Take 1 tablet (40 mg total) by mouth 2 (two) times daily. 10/05/24   Lue Elsie BROCKS, MD  polyethylene glycol (MIRALAX  / GLYCOLAX ) 17 g packet Take 17 g by mouth daily as needed (Constipation).    [provider]  Potassium Chloride  ER 20 MEQ TBCR Take 1 tablet by mouth daily. 09/25/24   [provider]  senna (SENOKOT) 8.6 MG TABS tablet Take 1 tablet by mouth every 12 (twelve) hours as needed for mild constipation.    [provider]  sucralfate  (CARAFATE ) 1 g tablet Take 1 tablet (1 g total) by mouth 2 (two) times daily. 10/05/24   Lue Elsie BROCKS, MD  tamsulosin  (FLOMAX ) 0.4 MG CAPS capsule Take 1 capsule (0.4 mg total) by mouth daily. Patient taking differently: Take 0.4 mg by mouth every evening. 11/27/16   Ricky Fines, MD  traZODone  (DESYREL ) 100 MG tablet Take 100 mg by mouth at bedtime.    [provider]  triamcinolone cream (KENALOG) 0.1 % Apply 1 Application topically as needed (Skin irritation). 06/21/24   [provider]    Allergies: Patient has no known allergies.    Review of Systems  Updated Vital Signs BP 122/72   Pulse 79   Temp 98.2 F (36.8 C) (Oral)   Resp 19   Ht 6' 3.5 (1.918 m)   Wt 103.9 kg   SpO2 98%   BMI 28.25 kg/m   Physical Exam Vitals and nursing note reviewed. Exam conducted with a chaperone present.  Constitutional:      General: He is not in acute distress.    Appearance: He is well-developed. He is not diaphoretic.  HENT:     Head: Normocephalic and atraumatic.     Mouth/Throat:     Mouth: Mucous membranes are moist.  Eyes:     General: No scleral icterus.    Extraocular Movements: Extraocular movements intact.     Conjunctiva/sclera: Conjunctivae normal.     Pupils: Pupils are equal, round, and reactive to light.  Cardiovascular:     Rate and Rhythm: Normal rate and  regular rhythm.     Heart sounds: Normal heart sounds.  Pulmonary:     Effort: Pulmonary effort is normal. No respiratory distress.     Breath sounds: Normal breath sounds.  Abdominal:     Palpations: Abdomen is soft.     Tenderness: There is no abdominal tenderness.  Musculoskeletal:     Cervical back: Normal range of motion and neck supple.     Comments: Bilateral lower extremity edema.  Patient is status post BKA on the left there is bruising at the stump without signs of infection patient states that he transfers on this leg frequently when getting into the chair.  Patient also with extensive dead skin on the surface of the right lower  extremity.  I personally debrided this tissue.  There is large amount of weeping tissue without signs of infection a large area of bruising to the posterior calf where the wound wrap was applying pressure on the skin.  There is edema up to the thigh.  He also has notable bruising in the heel and dorsum of the right foot and below the toenail on the right great toe  Skin:    General: Skin is warm and dry.  Neurological:     Mental Status: He is alert.  Psychiatric:        Behavior: Behavior normal.          (all labs ordered are listed, but only abnormal results are displayed) Labs Reviewed  CBC WITH DIFFERENTIAL/PLATELET - Abnormal; Notable for the following components:      Result Value   RBC 3.28 (*)    Hemoglobin 10.0 (*)    HCT 31.9 (*)    RDW 16.9 (*)    All other components within normal limits  COMPREHENSIVE METABOLIC PANEL WITH GFR - Abnormal; Notable for the following components:   Sodium 134 (*)    Chloride 95 (*)    Glucose, Bld 272 (*)    All other components within normal limits    EKG: None  Radiology: No results found.   Debridement  Date/Time: 11/03/2024 10:25 AM  Performed by: Arloa Chroman, PA-C Authorized by: Arloa Chroman, PA-C  Consent: Verbal consent obtained Consent given by: patient Required items:  required blood products, implants, devices, and special equipment available Patient identity confirmed: verbally with patient Time out: Immediately prior to procedure a time out was called to verify the correct patient, procedure, equipment, support staff and site/side marked as required. Preparation: Patient was prepped and draped in the usual sterile fashion. Patient tolerance: patient tolerated the procedure well with no immediate complications Comments: Extensive removal of accumulated, dried, and macerated tissue on the R leg      Medications Ordered in the ED - No data to display  Clinical Course as of 11/03/24 1544  Sat Nov 03, 2024  1107 WBC: 7.7 [AH]  1107 Glucose(!): 272 [AH]  1300 CBC with Differential(!) [AH]  1300 Comprehensive metabolic panel(!) [AH]    Clinical Course User Index [AH] Arloa Chroman, PA-C                                 Medical Decision Making Amount and/or Complexity of Data Reviewed Labs: ordered. Decision-making details documented in ED Course.   This patient presents to the ED for concern of leg pain , this involves an extensive number of treatment options, and is a complaint that carries with it a high risk of complications and morbidity.  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    Social  Determinants of Health:   SDOH Screenings   Food Insecurity: No Food Insecurity (09/29/2024)  Housing: Low Risk  (09/29/2024)  Transportation Needs: No Transportation Needs (09/29/2024)  Utilities: Not At Risk (09/29/2024)  Depression (PHQ2-9): Low Risk  (05/23/2024)  Social Connections: Moderately Isolated (09/29/2024)  Tobacco Use: Medium Risk (11/03/2024)       Lab Tests:  I Ordered, and personally interpreted labs.  The pertinent results include:   Blood cell count within normal limits, hemoglobin shows anemia of chronic disease unchanged.  Glucose mildly elevated    Cardiac Monitoring/ECG:  The patient was maintained on a cardiac monitor.  I personally viewed and interpreted the cardiac monitored which showed an underlying rhythm of: N/A    Test Considered:   considered further assessment with lower extremity DVT ultrasound however he has chronic edema and I do not think this represents acute DVT  Critical Interventions:    Consultations Obtained: Wound care consulted who reviewed images recommend very close management due to the pressure injury  Problem List / ED Course:     ICD-10-CM   1. Edema, peripheral  R60.0     2. Pressure injury  L89.90       MDM: Patient here with chronic peripheral edema unchanged bandage x 1 week.  Triage note states patient has necrosis however this was just macerated and dead skin that was very dry and easily removed.  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  I have ordered home  health and referral to the wound care center along with strict return precautions that if he is not able to get close follow-up he should just return to the ER for reevaluation and management.  He was placed in Unna boots to help with the extensive weeping from his legs and help dry up the tissue.  This may also provide some soothing to the skin.    Dispostion:  After consideration of the diagnostic results and the patients response to treatment, I feel that the patent would benefit from discharge home with close outpatient follow-up strict return precautions.      Final diagnoses:  Edema, peripheral  Pressure injury    ED Discharge Orders          Ordered    Home Health        11/03/24 1248    Face-to-face encounter (required for Medicare/Medicaid patients)       Comments: I Lavanda Lesches certify that this patient is under my care and that I, or a nurse practitioner or physician's assistant working with me, had a face-to-face encounter that meets the physician face-to-face encounter requirements with this patient on 11/03/2024. The encounter with the patient was in whole, or in part for the following medical condition(s) which is the primary reason for home health care (List medical condition):  Weeping lymphedema and pressure injury- unna boots in place needs close monitoring and 3 x week dressing change- include protection of pressure wound with topical care   11/03/24 1248               Lesches Lavanda, PA-C 11/03/24 1544    Bernard Drivers, MD 11/06/24 [phone]

## 2024-11-03 NOTE — Discharge Instructions (Addendum)
 You have weeping edema of your right lower extremity.  You also have an area of pressure injury from previous bandage.  I have ordered home health to come out and do complex wound management and bandage changes as well as to keep an eye out on the pressure injury.  If it has been greater than 3 days and no one has called you please feel free to return to the emergency department for evaluation of your lower extremity or if you are having any worsening pain or symptoms before that time. Also given you referral to the [facility] health wound care and hyperbaric center.  Please call them first thing Monday to set up ongoing appointments for management of your complex lymphedema case.  Get help right away if: You develop shortness of breath or chest pain. You cannot breathe when you lie down. You develop pain, redness, or warmth in the swollen areas. You have heart, liver, or kidney disease and suddenly get edema. You have a fever and your symptoms suddenly get worse. These symptoms may be an emergency. Get help right away. Call 911. Do not wait to see if the symptoms will go away. Do not drive yourself to the hospital.

## 2024-11-03 NOTE — ED Triage Notes (Signed)
 C/o leg leaking, edema, cellulitus, red, inflamed, foul smell, bandage removed per ems skin came off with it, lymphadema, necrotic spots, pain last night,  Alert, oriented, can stand with assistance 118/60 90 RA, 97 Cbg 394 after m and ms, no meds yet Temp 98.0

## 2024-11-03 NOTE — ED Notes (Signed)
 Pts RLE debrided by Abby  NP.  Pictures taken and saved in pts chart.  Foot cap refill less than 3 sec.  LE stump bruised.  Pt stated that he uses it to hoist himself up so it has been bruised.  No weeping or open spots.   IV started, Labs sent.

## 2024-11-03 NOTE — Progress Notes (Signed)
 Orthopedic Tech Progress Note Patient Details:  [name] Dec 06, 1951 [id number]  Ortho Devices Type of Ortho Device: Radio broadcast assistant Ortho Device/Splint Location: RLE only Ortho Device/Splint Interventions: Ordered, Application, Adjustment   Post Interventions Patient Tolerated: Well  [name] 11/03/2024, 10:26 AM

## 2025-01-01 ENCOUNTER — Encounter (HOSPITAL_BASED_OUTPATIENT_CLINIC_OR_DEPARTMENT_OTHER): Attending: Internal Medicine | Admitting: General Surgery

## 2025-01-01 DIAGNOSIS — I890 Lymphedema, not elsewhere classified: Secondary | ICD-10-CM | POA: Insufficient documentation

## 2025-01-01 DIAGNOSIS — I872 Venous insufficiency (chronic) (peripheral): Secondary | ICD-10-CM | POA: Insufficient documentation

## 2025-01-01 DIAGNOSIS — Z87891 Personal history of nicotine dependence: Secondary | ICD-10-CM | POA: Diagnosis not present

## 2025-01-01 DIAGNOSIS — L97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 with unspecified severity: Secondary | ICD-10-CM | POA: Diagnosis not present

## 2025-01-01 DIAGNOSIS — E11622 Type 2 diabetes mellitus with other skin ulcer: Secondary | ICD-10-CM | POA: Insufficient documentation

## 2025-01-10 ENCOUNTER — Encounter (HOSPITAL_BASED_OUTPATIENT_CLINIC_OR_DEPARTMENT_OTHER): Admitting: General Surgery

## 2025-01-10 DIAGNOSIS — E11622 Type 2 diabetes mellitus with other skin ulcer: Secondary | ICD-10-CM | POA: Diagnosis not present

## 2025-01-17 ENCOUNTER — Encounter (HOSPITAL_BASED_OUTPATIENT_CLINIC_OR_DEPARTMENT_OTHER): Admitting: General Surgery

## 2025-01-25 ENCOUNTER — Encounter (HOSPITAL_BASED_OUTPATIENT_CLINIC_OR_DEPARTMENT_OTHER): Admitting: General Surgery
